# Patient Record
Sex: Female | Born: 1935
Health system: Southern US, Community
[De-identification: ages and names within clinical notes are randomized; demographics above are authoritative.]

## PROBLEM LIST (undated history)

## (undated) DIAGNOSIS — C4491 Basal cell carcinoma of skin, unspecified: Secondary | ICD-10-CM

## (undated) DIAGNOSIS — I480 Paroxysmal atrial fibrillation: Secondary | ICD-10-CM

## (undated) DIAGNOSIS — K2970 Gastritis, unspecified, without bleeding: Secondary | ICD-10-CM

## (undated) DIAGNOSIS — J189 Pneumonia, unspecified organism: Secondary | ICD-10-CM

## (undated) DIAGNOSIS — H269 Unspecified cataract: Secondary | ICD-10-CM

## (undated) DIAGNOSIS — F329 Major depressive disorder, single episode, unspecified: Secondary | ICD-10-CM

## (undated) DIAGNOSIS — F419 Anxiety disorder, unspecified: Secondary | ICD-10-CM

## (undated) DIAGNOSIS — I639 Cerebral infarction, unspecified: Secondary | ICD-10-CM

## (undated) DIAGNOSIS — M8580 Other specified disorders of bone density and structure, unspecified site: Secondary | ICD-10-CM

## (undated) DIAGNOSIS — T7840XA Allergy, unspecified, initial encounter: Secondary | ICD-10-CM

## (undated) DIAGNOSIS — B9681 Helicobacter pylori [H. pylori] as the cause of diseases classified elsewhere: Secondary | ICD-10-CM

## (undated) DIAGNOSIS — F32A Depression, unspecified: Secondary | ICD-10-CM

## (undated) HISTORY — PX: PARTIAL HYSTERECTOMY: SHX80

## (undated) HISTORY — DX: Other specified disorders of bone density and structure, unspecified site: M85.80

## (undated) HISTORY — PX: CHOLECYSTECTOMY: SHX55

## (undated) HISTORY — DX: Cerebral infarction, unspecified: I63.9

## (undated) HISTORY — DX: Helicobacter pylori (H. pylori) as the cause of diseases classified elsewhere: B96.81

## (undated) HISTORY — DX: Unspecified cataract: H26.9

## (undated) HISTORY — DX: Helicobacter pylori (H. pylori) as the cause of diseases classified elsewhere: K29.70

## (undated) HISTORY — DX: Basal cell carcinoma of skin, unspecified: C44.91

## (undated) HISTORY — DX: Pneumonia, unspecified organism: J18.9

## (undated) HISTORY — DX: Anxiety disorder, unspecified: F41.9

## (undated) HISTORY — PX: CATARACT EXTRACTION W/ INTRAOCULAR LENS  IMPLANT, BILATERAL: SHX1307

## (undated) HISTORY — DX: Depression, unspecified: F32.A

## (undated) HISTORY — PX: BREAST LUMPECTOMY: SHX2

## (undated) HISTORY — PX: APPENDECTOMY: SHX54

## (undated) HISTORY — PX: MOHS SURGERY: SUR867

## (undated) HISTORY — PX: SKIN GRAFT: SHX250

## (undated) HISTORY — DX: Allergy, unspecified, initial encounter: T78.40XA

## (undated) HISTORY — PX: OTHER SURGICAL HISTORY: SHX169

---
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
## 1998-02-24 ENCOUNTER — Other Ambulatory Visit: Admission: RE | Admit: 1998-02-24 | Discharge: 1998-02-24 | Payer: Self-pay | Admitting: Obstetrics and Gynecology

## 1998-03-29 ENCOUNTER — Ambulatory Visit (HOSPITAL_COMMUNITY): Admission: RE | Admit: 1998-03-29 | Discharge: 1998-03-29 | Payer: Self-pay | Admitting: *Deleted

## 1999-02-23 ENCOUNTER — Encounter: Payer: Self-pay | Admitting: Obstetrics and Gynecology

## 1999-02-23 ENCOUNTER — Encounter: Admission: RE | Admit: 1999-02-23 | Discharge: 1999-02-23 | Payer: Self-pay | Admitting: Obstetrics and Gynecology

## 1999-03-08 ENCOUNTER — Encounter: Admission: RE | Admit: 1999-03-08 | Discharge: 1999-03-08 | Payer: Self-pay | Admitting: Obstetrics and Gynecology

## 1999-03-08 ENCOUNTER — Encounter: Payer: Self-pay | Admitting: Obstetrics and Gynecology

## 1999-04-03 ENCOUNTER — Emergency Department (HOSPITAL_COMMUNITY): Admission: EM | Admit: 1999-04-03 | Discharge: 1999-04-03 | Payer: Self-pay | Admitting: Emergency Medicine

## 1999-04-03 ENCOUNTER — Encounter: Payer: Self-pay | Admitting: Emergency Medicine

## 1999-09-25 ENCOUNTER — Encounter: Admission: RE | Admit: 1999-09-25 | Discharge: 1999-09-25 | Payer: Self-pay | Admitting: *Deleted

## 1999-09-25 ENCOUNTER — Encounter: Payer: Self-pay | Admitting: *Deleted

## 1999-11-26 ENCOUNTER — Encounter: Payer: Self-pay | Admitting: Emergency Medicine

## 1999-11-26 ENCOUNTER — Emergency Department (HOSPITAL_COMMUNITY): Admission: EM | Admit: 1999-11-26 | Discharge: 1999-11-26 | Payer: Self-pay | Admitting: Emergency Medicine

## 2000-03-08 ENCOUNTER — Encounter: Payer: Self-pay | Admitting: Obstetrics and Gynecology

## 2000-03-08 ENCOUNTER — Encounter: Admission: RE | Admit: 2000-03-08 | Discharge: 2000-03-08 | Payer: Self-pay | Admitting: Obstetrics and Gynecology

## 2000-04-18 ENCOUNTER — Encounter: Admission: RE | Admit: 2000-04-18 | Discharge: 2000-07-17 | Payer: Self-pay | Admitting: Internal Medicine

## 2000-11-15 ENCOUNTER — Emergency Department (HOSPITAL_COMMUNITY): Admission: EM | Admit: 2000-11-15 | Discharge: 2000-11-15 | Payer: Self-pay | Admitting: Emergency Medicine

## 2000-11-15 ENCOUNTER — Encounter: Payer: Self-pay | Admitting: Emergency Medicine

## 2001-03-12 ENCOUNTER — Encounter: Payer: Self-pay | Admitting: Internal Medicine

## 2001-03-12 ENCOUNTER — Encounter: Admission: RE | Admit: 2001-03-12 | Discharge: 2001-03-12 | Payer: Self-pay | Admitting: Internal Medicine

## 2001-03-17 ENCOUNTER — Other Ambulatory Visit: Admission: RE | Admit: 2001-03-17 | Discharge: 2001-03-17 | Payer: Self-pay | Admitting: Obstetrics and Gynecology

## 2001-04-17 ENCOUNTER — Ambulatory Visit (HOSPITAL_COMMUNITY): Admission: RE | Admit: 2001-04-17 | Discharge: 2001-04-17 | Payer: Self-pay | Admitting: Gastroenterology

## 2002-03-27 ENCOUNTER — Encounter: Admission: RE | Admit: 2002-03-27 | Discharge: 2002-03-27 | Payer: Self-pay | Admitting: Internal Medicine

## 2002-03-27 ENCOUNTER — Encounter: Payer: Self-pay | Admitting: Internal Medicine

## 2002-04-08 ENCOUNTER — Other Ambulatory Visit: Admission: RE | Admit: 2002-04-08 | Discharge: 2002-04-08 | Payer: Self-pay | Admitting: Obstetrics and Gynecology

## 2003-03-29 ENCOUNTER — Encounter: Admission: RE | Admit: 2003-03-29 | Discharge: 2003-03-29 | Payer: Self-pay | Admitting: Obstetrics and Gynecology

## 2003-04-12 ENCOUNTER — Other Ambulatory Visit: Admission: RE | Admit: 2003-04-12 | Discharge: 2003-04-12 | Payer: Self-pay | Admitting: Obstetrics and Gynecology

## 2003-09-18 ENCOUNTER — Emergency Department (HOSPITAL_COMMUNITY): Admission: EM | Admit: 2003-09-18 | Discharge: 2003-09-18 | Payer: Self-pay | Admitting: *Deleted

## 2004-03-29 ENCOUNTER — Encounter: Admission: RE | Admit: 2004-03-29 | Discharge: 2004-03-29 | Payer: Self-pay | Admitting: Internal Medicine

## 2004-04-05 ENCOUNTER — Ambulatory Visit: Payer: Self-pay | Admitting: Internal Medicine

## 2004-04-13 ENCOUNTER — Ambulatory Visit: Payer: Self-pay | Admitting: Internal Medicine

## 2004-04-18 ENCOUNTER — Other Ambulatory Visit: Admission: RE | Admit: 2004-04-18 | Discharge: 2004-04-18 | Payer: Self-pay | Admitting: Obstetrics and Gynecology

## 2004-07-05 ENCOUNTER — Ambulatory Visit: Payer: Self-pay | Admitting: Internal Medicine

## 2004-07-19 ENCOUNTER — Ambulatory Visit: Payer: Self-pay | Admitting: Internal Medicine

## 2004-08-18 ENCOUNTER — Ambulatory Visit: Payer: Self-pay | Admitting: Internal Medicine

## 2004-10-11 ENCOUNTER — Ambulatory Visit: Payer: Self-pay | Admitting: Internal Medicine

## 2004-10-18 ENCOUNTER — Ambulatory Visit: Payer: Self-pay | Admitting: Internal Medicine

## 2005-03-01 ENCOUNTER — Ambulatory Visit: Payer: Self-pay | Admitting: Internal Medicine

## 2005-04-04 ENCOUNTER — Encounter: Admission: RE | Admit: 2005-04-04 | Discharge: 2005-04-04 | Payer: Self-pay | Admitting: Obstetrics and Gynecology

## 2005-04-10 ENCOUNTER — Ambulatory Visit: Payer: Self-pay | Admitting: Internal Medicine

## 2005-04-18 ENCOUNTER — Ambulatory Visit: Payer: Self-pay | Admitting: Internal Medicine

## 2005-04-19 ENCOUNTER — Other Ambulatory Visit: Admission: RE | Admit: 2005-04-19 | Discharge: 2005-04-19 | Payer: Self-pay | Admitting: Obstetrics and Gynecology

## 2005-06-14 ENCOUNTER — Ambulatory Visit: Payer: Self-pay | Admitting: Internal Medicine

## 2005-07-11 ENCOUNTER — Ambulatory Visit: Payer: Self-pay | Admitting: Endocrinology

## 2005-07-12 ENCOUNTER — Ambulatory Visit: Payer: Self-pay | Admitting: Internal Medicine

## 2005-08-24 ENCOUNTER — Ambulatory Visit: Payer: Self-pay | Admitting: Endocrinology

## 2005-12-05 ENCOUNTER — Ambulatory Visit: Payer: Self-pay | Admitting: Internal Medicine

## 2005-12-10 ENCOUNTER — Ambulatory Visit: Payer: Self-pay | Admitting: Cardiovascular Disease

## 2005-12-11 ENCOUNTER — Ambulatory Visit: Payer: Self-pay

## 2006-02-08 ENCOUNTER — Ambulatory Visit: Payer: Self-pay | Admitting: Internal Medicine

## 2006-04-08 ENCOUNTER — Encounter: Admission: RE | Admit: 2006-04-08 | Discharge: 2006-04-08 | Payer: Self-pay | Admitting: Obstetrics and Gynecology

## 2006-04-18 ENCOUNTER — Ambulatory Visit: Payer: Self-pay | Admitting: Internal Medicine

## 2006-05-14 ENCOUNTER — Ambulatory Visit: Payer: Self-pay | Admitting: Internal Medicine

## 2006-05-14 LAB — CONVERTED CEMR LAB
ALT: 18 units/L (ref 0–40)
AST: 20 units/L (ref 0–37)
BUN: 23 mg/dL (ref 6–23)
Chol/HDL Ratio, serum: 4.2
Cholesterol: 232 mg/dL (ref 0–200)
Creatinine, Ser: 0.7 mg/dL (ref 0.4–1.2)
Glucose, Bld: 99 mg/dL (ref 70–99)
HDL: 55.1 mg/dL (ref >39.0)
LDL DIRECT: 151.2 mg/dL
Potassium: 4.3 meq/L (ref 3.5–5.1)
Sed Rate: 19 mm/hr (ref 0–25)
Sodium: 139 meq/L (ref 135–145)
TSH: 4.39 microintl units/mL (ref 0.35–5.50)
Triglyceride fasting, serum: 64 mg/dL (ref 0–149)
VLDL: 13 mg/dL (ref 0–40)
Vit D, 1,25-Dihydroxy: 21 (ref 20–57)

## 2006-06-15 ENCOUNTER — Ambulatory Visit: Payer: Self-pay | Admitting: Family Medicine

## 2006-07-16 ENCOUNTER — Ambulatory Visit: Payer: Self-pay | Admitting: Internal Medicine

## 2006-08-08 ENCOUNTER — Encounter: Admission: RE | Admit: 2006-08-08 | Discharge: 2006-08-08 | Payer: Self-pay | Admitting: Orthopedic Surgery

## 2006-08-14 ENCOUNTER — Ambulatory Visit: Payer: Self-pay | Admitting: Internal Medicine

## 2006-10-17 ENCOUNTER — Ambulatory Visit: Payer: Self-pay | Admitting: Internal Medicine

## 2006-10-17 LAB — CONVERTED CEMR LAB
ALT: 18 units/L (ref 0–40)
AST: 20 units/L (ref 0–37)
Albumin: 4 g/dL (ref 3.5–5.2)
Alkaline Phosphatase: 70 units/L (ref 39–117)
BUN: 23 mg/dL (ref 6–23)
Basophils Absolute: 0 10*3/uL (ref 0.0–0.1)
Basophils Relative: 0.6 % (ref 0.0–1.0)
Bilirubin Urine: NEGATIVE
Bilirubin, Direct: 0.1 mg/dL (ref 0.0–0.3)
CO2: 29 meq/L (ref 19–32)
Calcium: 9.4 mg/dL (ref 8.4–10.5)
Chloride: 107 meq/L (ref 96–112)
Creatinine, Ser: 0.6 mg/dL (ref 0.4–1.2)
Crystals: NEGATIVE
Eosinophils Absolute: 0.1 10*3/uL (ref 0.0–0.6)
Eosinophils Relative: 2.5 % (ref 0.0–5.0)
GFR calc Af Amer: 128 mL/min
GFR calc non Af Amer: 106 mL/min
Glucose, Bld: 87 mg/dL (ref 70–99)
HCT: 39.8 % (ref 36.0–46.0)
Hemoglobin: 13.5 g/dL (ref 12.0–15.0)
Ketones, ur: NEGATIVE mg/dL
Lymphocytes Relative: 29.4 % (ref 12.0–46.0)
MCHC: 33.9 g/dL (ref 30.0–36.0)
MCV: 92.8 fL (ref 78.0–100.0)
Monocytes Absolute: 0.5 10*3/uL (ref 0.2–0.7)
Monocytes Relative: 8.5 % (ref 3.0–11.0)
Neutro Abs: 3.4 10*3/uL (ref 1.4–7.7)
Neutrophils Relative %: 59 % (ref 43.0–77.0)
Nitrite: NEGATIVE
Platelets: 206 10*3/uL (ref 150–400)
Potassium: 4.4 meq/L (ref 3.5–5.1)
RBC: 4.29 M/uL (ref 3.87–5.11)
RDW: 12.3 % (ref 11.5–14.6)
Sodium: 141 meq/L (ref 135–145)
Specific Gravity, Urine: >=1.03 (ref 1.000–1.03)
TSH: 3.04 microintl units/mL (ref 0.35–5.50)
Total Bilirubin: 0.8 mg/dL (ref 0.3–1.2)
Total Protein, Urine: NEGATIVE mg/dL
Total Protein: 6.7 g/dL (ref 6.0–8.3)
Urine Glucose: NEGATIVE mg/dL
Urobilinogen, UA: 0.2 (ref 0.0–1.0)
WBC: 5.6 10*3/uL (ref 4.5–10.5)
pH: 5.5 (ref 5.0–8.0)

## 2006-11-21 DIAGNOSIS — T7840XA Allergy, unspecified, initial encounter: Secondary | ICD-10-CM | POA: Insufficient documentation

## 2006-11-21 DIAGNOSIS — E785 Hyperlipidemia, unspecified: Secondary | ICD-10-CM

## 2006-11-21 DIAGNOSIS — M899 Disorder of bone, unspecified: Secondary | ICD-10-CM | POA: Insufficient documentation

## 2006-11-21 DIAGNOSIS — M949 Disorder of cartilage, unspecified: Secondary | ICD-10-CM

## 2007-02-07 ENCOUNTER — Ambulatory Visit: Payer: Self-pay | Admitting: Internal Medicine

## 2007-02-07 ENCOUNTER — Encounter: Payer: Self-pay | Admitting: Internal Medicine

## 2007-02-07 DIAGNOSIS — F411 Generalized anxiety disorder: Secondary | ICD-10-CM

## 2007-02-07 DIAGNOSIS — R634 Abnormal weight loss: Secondary | ICD-10-CM

## 2007-02-07 DIAGNOSIS — F519 Sleep disorder not due to a substance or known physiological condition, unspecified: Secondary | ICD-10-CM | POA: Insufficient documentation

## 2007-04-10 ENCOUNTER — Encounter: Admission: RE | Admit: 2007-04-10 | Discharge: 2007-04-10 | Payer: Self-pay | Admitting: Obstetrics and Gynecology

## 2007-05-08 ENCOUNTER — Ambulatory Visit: Payer: Self-pay | Admitting: Internal Medicine

## 2007-08-18 ENCOUNTER — Ambulatory Visit: Payer: Self-pay | Admitting: Internal Medicine

## 2007-10-14 ENCOUNTER — Encounter: Admission: RE | Admit: 2007-10-14 | Discharge: 2007-10-14 | Payer: Self-pay | Admitting: Orthopedic Surgery

## 2007-11-06 ENCOUNTER — Encounter: Payer: Self-pay | Admitting: Internal Medicine

## 2007-11-25 ENCOUNTER — Ambulatory Visit: Payer: Self-pay | Admitting: Internal Medicine

## 2007-11-25 LAB — CONVERTED CEMR LAB
ALT: 13 units/L (ref 0–35)
AST: 18 units/L (ref 0–37)
Albumin: 3.8 g/dL (ref 3.5–5.2)
Alkaline Phosphatase: 69 units/L (ref 39–117)
BUN: 21 mg/dL (ref 6–23)
Basophils Absolute: 0.2 10*3/uL — ABNORMAL HIGH (ref 0.0–0.1)
Basophils Relative: 2.6 % (ref 0.0–3.0)
Bilirubin Urine: NEGATIVE
Bilirubin, Direct: 0.1 mg/dL (ref 0.0–0.3)
CO2: 29 meq/L (ref 19–32)
Calcium: 9.3 mg/dL (ref 8.4–10.5)
Chloride: 105 meq/L (ref 96–112)
Creatinine, Ser: 0.7 mg/dL (ref 0.4–1.2)
Crystals: NEGATIVE
Eosinophils Absolute: 0.1 10*3/uL (ref 0.0–0.7)
Eosinophils Relative: 1.4 % (ref 0.0–5.0)
GFR calc Af Amer: 107 mL/min
GFR calc non Af Amer: 88 mL/min
Glucose, Bld: 100 mg/dL — ABNORMAL HIGH (ref 70–99)
HCT: 38.1 % (ref 36.0–46.0)
Hemoglobin, Urine: NEGATIVE
Hemoglobin: 13 g/dL (ref 12.0–15.0)
Ketones, ur: NEGATIVE mg/dL
Lymphocytes Relative: 25.1 % (ref 12.0–46.0)
MCHC: 34.1 g/dL (ref 30.0–36.0)
MCV: 95.3 fL (ref 78.0–100.0)
Monocytes Absolute: 0.3 10*3/uL (ref 0.1–1.0)
Monocytes Relative: 5.9 % (ref 3.0–12.0)
Mucus, UA: NEGATIVE
Neutro Abs: 3.8 10*3/uL (ref 1.4–7.7)
Neutrophils Relative %: 65 % (ref 43.0–77.0)
Nitrite: NEGATIVE
Platelets: 201 10*3/uL (ref 150–400)
Potassium: 4.5 meq/L (ref 3.5–5.1)
RBC / HPF: NONE SEEN
RBC: 4 M/uL (ref 3.87–5.11)
RDW: 11.8 % (ref 11.5–14.6)
Sodium: 139 meq/L (ref 135–145)
Specific Gravity, Urine: 1.025 (ref 1.000–1.03)
TSH: 2.9 microintl units/mL (ref 0.35–5.50)
Total Bilirubin: 1 mg/dL (ref 0.3–1.2)
Total Protein, Urine: NEGATIVE mg/dL
Total Protein: 6.4 g/dL (ref 6.0–8.3)
Urine Glucose: NEGATIVE mg/dL
Urobilinogen, UA: 0.2 (ref 0.0–1.0)
Vit D, 1,25-Dihydroxy: 30 (ref 30–89)
WBC: 5.9 10*3/uL (ref 4.5–10.5)
pH: 5.5 (ref 5.0–8.0)

## 2007-12-05 ENCOUNTER — Ambulatory Visit: Payer: Self-pay | Admitting: Internal Medicine

## 2008-01-29 ENCOUNTER — Ambulatory Visit: Payer: Self-pay | Admitting: Internal Medicine

## 2008-01-29 DIAGNOSIS — B9789 Other viral agents as the cause of diseases classified elsewhere: Secondary | ICD-10-CM

## 2008-02-02 ENCOUNTER — Telehealth: Payer: Self-pay | Admitting: Internal Medicine

## 2008-02-17 ENCOUNTER — Ambulatory Visit: Payer: Self-pay | Admitting: Internal Medicine

## 2008-02-17 DIAGNOSIS — R05 Cough: Secondary | ICD-10-CM

## 2008-02-17 DIAGNOSIS — R059 Cough, unspecified: Secondary | ICD-10-CM | POA: Insufficient documentation

## 2008-02-25 ENCOUNTER — Ambulatory Visit: Payer: Self-pay | Admitting: Internal Medicine

## 2008-02-25 DIAGNOSIS — F4323 Adjustment disorder with mixed anxiety and depressed mood: Secondary | ICD-10-CM

## 2008-04-13 ENCOUNTER — Encounter: Admission: RE | Admit: 2008-04-13 | Discharge: 2008-04-13 | Payer: Self-pay | Admitting: Obstetrics and Gynecology

## 2008-04-19 ENCOUNTER — Ambulatory Visit: Payer: Self-pay | Admitting: Internal Medicine

## 2008-04-19 ENCOUNTER — Encounter: Payer: Self-pay | Admitting: Internal Medicine

## 2008-04-19 DIAGNOSIS — J209 Acute bronchitis, unspecified: Secondary | ICD-10-CM

## 2008-05-06 ENCOUNTER — Ambulatory Visit: Payer: Self-pay | Admitting: Internal Medicine

## 2008-05-06 DIAGNOSIS — Z8709 Personal history of other diseases of the respiratory system: Secondary | ICD-10-CM | POA: Insufficient documentation

## 2008-05-31 ENCOUNTER — Ambulatory Visit: Payer: Self-pay | Admitting: Internal Medicine

## 2008-05-31 LAB — CONVERTED CEMR LAB
BUN: 17 mg/dL (ref 6–23)
Basophils Absolute: 0 10*3/uL (ref 0.0–0.1)
Basophils Relative: 0.5 % (ref 0.0–3.0)
CO2: 30 meq/L (ref 19–32)
Calcium: 9.5 mg/dL (ref 8.4–10.5)
Chloride: 105 meq/L (ref 96–112)
Cholesterol: 208 mg/dL (ref 0–200)
Creatinine, Ser: 0.7 mg/dL (ref 0.4–1.2)
Direct LDL: 136.8 mg/dL
Eosinophils Absolute: 0.1 10*3/uL (ref 0.0–0.7)
Eosinophils Relative: 2 % (ref 0.0–5.0)
GFR calc Af Amer: 106 mL/min
GFR calc non Af Amer: 88 mL/min
Glucose, Bld: 99 mg/dL (ref 70–99)
HCT: 39.1 % (ref 36.0–46.0)
HDL: 55.2 mg/dL (ref >39.0)
Hemoglobin: 13.3 g/dL (ref 12.0–15.0)
Lymphocytes Relative: 24.8 % (ref 12.0–46.0)
MCHC: 33.9 g/dL (ref 30.0–36.0)
MCV: 95.2 fL (ref 78.0–100.0)
Monocytes Absolute: 0.4 10*3/uL (ref 0.1–1.0)
Monocytes Relative: 6.8 % (ref 3.0–12.0)
Neutro Abs: 3.8 10*3/uL (ref 1.4–7.7)
Neutrophils Relative %: 65.9 % (ref 43.0–77.0)
Platelets: 159 10*3/uL (ref 150–400)
Potassium: 4.5 meq/L (ref 3.5–5.1)
RBC: 4.11 M/uL (ref 3.87–5.11)
RDW: 12.8 % (ref 11.5–14.6)
Sodium: 140 meq/L (ref 135–145)
TSH: 3.22 microintl units/mL (ref 0.35–5.50)
Total CHOL/HDL Ratio: 3.8
Triglycerides: 68 mg/dL (ref 0–149)
VLDL: 14 mg/dL (ref 0–40)
WBC: 5.7 10*3/uL (ref 4.5–10.5)

## 2008-06-08 ENCOUNTER — Ambulatory Visit: Payer: Self-pay | Admitting: Internal Medicine

## 2008-10-18 ENCOUNTER — Ambulatory Visit: Payer: Self-pay | Admitting: Internal Medicine

## 2009-02-08 ENCOUNTER — Ambulatory Visit: Payer: Self-pay | Admitting: Internal Medicine

## 2009-04-01 ENCOUNTER — Ambulatory Visit: Payer: Self-pay | Admitting: Internal Medicine

## 2009-04-01 LAB — CONVERTED CEMR LAB
ALT: 17 units/L (ref 0–35)
AST: 21 units/L (ref 0–37)
Albumin: 3.9 g/dL (ref 3.5–5.2)
Alkaline Phosphatase: 80 units/L (ref 39–117)
BUN: 16 mg/dL (ref 6–23)
Basophils Absolute: 0 10*3/uL (ref 0.0–0.1)
Basophils Relative: 0.3 % (ref 0.0–3.0)
Bilirubin Urine: NEGATIVE
Bilirubin, Direct: 0.1 mg/dL (ref 0.0–0.3)
CO2: 29 meq/L (ref 19–32)
Calcium: 9.5 mg/dL (ref 8.4–10.5)
Chloride: 106 meq/L (ref 96–112)
Creatinine, Ser: 0.7 mg/dL (ref 0.4–1.2)
Eosinophils Absolute: 0.1 10*3/uL (ref 0.0–0.7)
Eosinophils Relative: 1.2 % (ref 0.0–5.0)
GFR calc non Af Amer: 87.66 mL/min (ref >60)
Glucose, Bld: 90 mg/dL (ref 70–99)
HCT: 40.2 % (ref 36.0–46.0)
Hemoglobin: 13.5 g/dL (ref 12.0–15.0)
Ketones, ur: NEGATIVE mg/dL
Lymphocytes Relative: 21.2 % (ref 12.0–46.0)
Lymphs Abs: 1.2 10*3/uL (ref 0.7–4.0)
MCHC: 33.5 g/dL (ref 30.0–36.0)
MCV: 97.3 fL (ref 78.0–100.0)
Monocytes Absolute: 0.3 10*3/uL (ref 0.1–1.0)
Monocytes Relative: 5.8 % (ref 3.0–12.0)
Neutro Abs: 4.2 10*3/uL (ref 1.4–7.7)
Neutrophils Relative %: 71.5 % (ref 43.0–77.0)
Nitrite: NEGATIVE
Platelets: 201 10*3/uL (ref 150.0–400.0)
Potassium: 4.6 meq/L (ref 3.5–5.1)
RBC: 4.14 M/uL (ref 3.87–5.11)
RDW: 12.2 % (ref 11.5–14.6)
Sodium: 142 meq/L (ref 135–145)
Specific Gravity, Urine: <=1.005 (ref 1.000–1.030)
TSH: 2.54 microintl units/mL (ref 0.35–5.50)
Total Bilirubin: 0.9 mg/dL (ref 0.3–1.2)
Total Protein, Urine: NEGATIVE mg/dL
Total Protein: 7 g/dL (ref 6.0–8.3)
Urine Glucose: NEGATIVE mg/dL
Urobilinogen, UA: 0.2 (ref 0.0–1.0)
WBC: 5.8 10*3/uL (ref 4.5–10.5)
pH: 5.5 (ref 5.0–8.0)

## 2009-04-11 ENCOUNTER — Ambulatory Visit: Payer: Self-pay | Admitting: Internal Medicine

## 2009-04-18 ENCOUNTER — Encounter: Admission: RE | Admit: 2009-04-18 | Discharge: 2009-04-18 | Payer: Self-pay | Admitting: Obstetrics and Gynecology

## 2009-06-29 ENCOUNTER — Telehealth: Payer: Self-pay | Admitting: Internal Medicine

## 2009-07-14 ENCOUNTER — Ambulatory Visit: Payer: Self-pay | Admitting: Internal Medicine

## 2009-09-12 ENCOUNTER — Ambulatory Visit: Payer: Self-pay | Admitting: Internal Medicine

## 2009-12-12 ENCOUNTER — Telehealth: Payer: Self-pay | Admitting: Internal Medicine

## 2009-12-12 ENCOUNTER — Ambulatory Visit: Payer: Self-pay | Admitting: Internal Medicine

## 2010-02-06 ENCOUNTER — Ambulatory Visit: Payer: Self-pay | Admitting: Internal Medicine

## 2010-02-07 ENCOUNTER — Encounter: Payer: Self-pay | Admitting: Internal Medicine

## 2010-02-08 LAB — CONVERTED CEMR LAB
ALT: 16 units/L (ref 0–35)
AST: 18 units/L (ref 0–37)
Albumin: 3.5 g/dL (ref 3.5–5.2)
Alkaline Phosphatase: 80 units/L (ref 39–117)
BUN: 15 mg/dL (ref 6–23)
Basophils Absolute: 0 10*3/uL (ref 0.0–0.1)
Basophils Relative: 0.6 % (ref 0.0–3.0)
Bilirubin Urine: NEGATIVE
Bilirubin, Direct: 0.1 mg/dL (ref 0.0–0.3)
CO2: 29 meq/L (ref 19–32)
Calcium: 9.4 mg/dL (ref 8.4–10.5)
Chloride: 107 meq/L (ref 96–112)
Cholesterol: 205 mg/dL — ABNORMAL HIGH (ref 0–200)
Creatinine, Ser: 0.7 mg/dL (ref 0.4–1.2)
Direct LDL: 129.5 mg/dL
Eosinophils Absolute: 0.1 10*3/uL (ref 0.0–0.7)
Eosinophils Relative: 2.1 % (ref 0.0–5.0)
GFR calc non Af Amer: 84.65 mL/min (ref >60)
Glucose, Bld: 85 mg/dL (ref 70–99)
HCT: 36.7 % (ref 36.0–46.0)
HDL: 49.5 mg/dL (ref >39.00)
Hemoglobin: 12.8 g/dL (ref 12.0–15.0)
Ketones, ur: NEGATIVE mg/dL
Lymphocytes Relative: 25.1 % (ref 12.0–46.0)
Lymphs Abs: 1.6 10*3/uL (ref 0.7–4.0)
MCHC: 34.9 g/dL (ref 30.0–36.0)
MCV: 94.2 fL (ref 78.0–100.0)
Monocytes Absolute: 0.4 10*3/uL (ref 0.1–1.0)
Monocytes Relative: 6.5 % (ref 3.0–12.0)
Neutro Abs: 4.1 10*3/uL (ref 1.4–7.7)
Neutrophils Relative %: 65.7 % (ref 43.0–77.0)
Nitrite: NEGATIVE
Platelets: 280 10*3/uL (ref 150.0–400.0)
Potassium: 5 meq/L (ref 3.5–5.1)
RBC: 3.9 M/uL (ref 3.87–5.11)
RDW: 12.5 % (ref 11.5–14.6)
Sodium: 141 meq/L (ref 135–145)
Specific Gravity, Urine: 1.02 (ref 1.000–1.030)
TSH: 3.77 microintl units/mL (ref 0.35–5.50)
Total Bilirubin: 0.6 mg/dL (ref 0.3–1.2)
Total CHOL/HDL Ratio: 4
Total Protein, Urine: NEGATIVE mg/dL
Total Protein: 6.3 g/dL (ref 6.0–8.3)
Triglycerides: 80 mg/dL (ref 0.0–149.0)
Urine Glucose: NEGATIVE mg/dL
Urobilinogen, UA: 0.2 (ref 0.0–1.0)
VLDL: 16 mg/dL (ref 0.0–40.0)
WBC: 6.3 10*3/uL (ref 4.5–10.5)
pH: 6 (ref 5.0–8.0)

## 2010-03-01 ENCOUNTER — Encounter: Payer: Self-pay | Admitting: Internal Medicine

## 2010-03-01 ENCOUNTER — Ambulatory Visit: Payer: Self-pay | Admitting: Internal Medicine

## 2010-03-01 DIAGNOSIS — H60399 Other infective otitis externa, unspecified ear: Secondary | ICD-10-CM | POA: Insufficient documentation

## 2010-03-10 ENCOUNTER — Encounter: Payer: Self-pay | Admitting: Internal Medicine

## 2010-03-10 ENCOUNTER — Ambulatory Visit: Payer: Self-pay | Admitting: Internal Medicine

## 2010-03-22 ENCOUNTER — Ambulatory Visit: Payer: Self-pay | Admitting: Internal Medicine

## 2010-03-22 DIAGNOSIS — R1084 Generalized abdominal pain: Secondary | ICD-10-CM

## 2010-03-22 DIAGNOSIS — R350 Frequency of micturition: Secondary | ICD-10-CM

## 2010-03-22 DIAGNOSIS — M545 Low back pain: Secondary | ICD-10-CM

## 2010-03-22 DIAGNOSIS — K5900 Constipation, unspecified: Secondary | ICD-10-CM | POA: Insufficient documentation

## 2010-03-22 LAB — CONVERTED CEMR LAB
Amylase: 28 units/L (ref 0–105)
BUN: 25 mg/dL — ABNORMAL HIGH (ref 6–23)
Basophils Absolute: 0 10*3/uL (ref 0.0–0.1)
Basophils Relative: 0.4 % (ref 0.0–3.0)
Bilirubin Urine: NEGATIVE
CO2: 26 meq/L (ref 19–32)
Calcium: 9.2 mg/dL (ref 8.4–10.5)
Chloride: 106 meq/L (ref 96–112)
Creatinine, Ser: 0.77 mg/dL (ref 0.40–1.20)
Eosinophils Absolute: 0 10*3/uL (ref 0.0–0.7)
Eosinophils Relative: 0.5 % (ref 0.0–5.0)
Glucose, Bld: 93 mg/dL (ref 70–99)
HCT: 35.3 % — ABNORMAL LOW (ref 36.0–46.0)
Hemoglobin, Urine: NEGATIVE
Hemoglobin: 12.4 g/dL (ref 12.0–15.0)
Ketones, ur: NEGATIVE mg/dL
Lipase: 22 units/L (ref 0–75)
Lymphocytes Relative: 16.7 % (ref 12.0–46.0)
Lymphs Abs: 1.4 10*3/uL (ref 0.7–4.0)
MCHC: 35.1 g/dL (ref 30.0–36.0)
MCV: 93.1 fL (ref 78.0–100.0)
Monocytes Absolute: 0.7 10*3/uL (ref 0.1–1.0)
Monocytes Relative: 8.4 % (ref 3.0–12.0)
Neutro Abs: 6.3 10*3/uL (ref 1.4–7.7)
Neutrophils Relative %: 74 % (ref 43.0–77.0)
Nitrite: NEGATIVE
Platelets: 182 10*3/uL (ref 150.0–400.0)
Potassium: 4.1 meq/L (ref 3.5–5.3)
RBC: 3.79 M/uL — ABNORMAL LOW (ref 3.87–5.11)
RDW: 12.8 % (ref 11.5–14.6)
Sodium: 139 meq/L (ref 135–145)
Specific Gravity, Urine: <=1.005 (ref 1.000–1.030)
TSH: 2.123 microintl units/mL (ref 0.350–4.500)
Total Protein, Urine: NEGATIVE mg/dL
Urine Glucose: NEGATIVE mg/dL
Urobilinogen, UA: 0.2 (ref 0.0–1.0)
WBC: 8.6 10*3/uL (ref 4.5–10.5)
pH: 6.5 (ref 5.0–8.0)

## 2010-04-17 ENCOUNTER — Telehealth: Payer: Self-pay | Admitting: Internal Medicine

## 2010-04-25 ENCOUNTER — Ambulatory Visit
Admission: RE | Admit: 2010-04-25 | Discharge: 2010-04-25 | Payer: Self-pay | Source: Home / Self Care | Attending: Internal Medicine | Admitting: Internal Medicine

## 2010-04-30 HISTORY — PX: ROTATOR CUFF REPAIR: SHX139

## 2010-05-21 ENCOUNTER — Encounter: Payer: Self-pay | Admitting: Obstetrics and Gynecology

## 2010-05-21 ENCOUNTER — Encounter: Payer: Self-pay | Admitting: Internal Medicine

## 2010-05-22 ENCOUNTER — Encounter
Admission: RE | Admit: 2010-05-22 | Discharge: 2010-05-22 | Payer: Self-pay | Source: Home / Self Care | Attending: Internal Medicine | Admitting: Internal Medicine

## 2010-05-22 LAB — HM MAMMOGRAPHY: HM Mammogram: NEGATIVE

## 2010-06-01 NOTE — Miscellaneous (Signed)
Summary: Bone Density  Clinical Lists Changes  Orders: Added new Test order of T-Lumbar Vertebral Assessment (77082) - Signed 

## 2010-06-01 NOTE — Assessment & Plan Note (Signed)
Summary: 3 MO ROV /NWS  #   Vital Signs:  Patient profile:   75 year old female Weight:      108 pounds Temp:     98.6 degrees F oral Pulse rate:   67 / minute BP sitting:   112 / 64  (left arm)  Vitals Entered By: Tora Perches (July 14, 2009 8:51 AM) CC: f/u Is Patient Diabetic? No   CC:  f/u.  History of Present Illness: C/o stress and anxiety  Preventive Screening-Counseling & Management  Alcohol-Tobacco     Smoking Status: never  Allergies: 1)  ! * Lovastatin  Past History:  Past Medical History: Last updated: 05/06/2008 Osteopenia Anxiety Pneumonia, hx of 12/09  Social History: Last updated: 04/11/2009 Retired Looking after her GGD Married Never Smoked Regular exercise-yes, yoga  Review of Systems       The patient complains of depression.  The patient denies fever, syncope, dyspnea on exertion, abdominal pain, and difficulty walking.    Physical Exam  General:  Well-developed,well-nourished,in no acute distress; alert,appropriate and cooperative throughout examination Nose:  External nasal examination shows no deformity or inflammation. Nasal mucosa are pink and moist without lesions or exudates. Mouth:  Oral mucosa and oropharynx without lesions or exudates.  Teeth in good repair. Lungs:  Normal respiratory effort, chest expands symmetrically. Lungs are clear to auscultation, no crackles or wheezes. Heart:  Normal rate and regular rhythm. S1 and S2 normal without gallop, murmur, click, rub or other extra sounds. Abdomen:  Bowel sounds positive,abdomen soft and non-tender without masses, organomegaly or hernias noted. Msk:  No deformity or scoliosis noted of thoracic or lumbar spine.   Neurologic:  No cranial nerve deficits noted. Station and gait are normal. Plantar reflexes are down-going bilaterally. DTRs are symmetrical throughout. Sensory, motor and coordinative functions appear intact. Skin:  Intact without suspicious lesions or rashes Psych:   Oriented X3, normally interactive, and not depressed appearing.  not suicidal.     Impression & Recommendations:  Problem # 1:  ANXIETY STATE, UNSPECIFIED (ICD-300.00) Assessment Comment Only  The following medications were removed from the medication list:    Fluoxetine Hcl 10 Mg Caps (Fluoxetine hcl) .Marland Kitchen... Take 1 capsule by mouth daily Her updated medication list for this problem includes:    Lorazepam 0.5 Mg Tabs (Lorazepam) .Marland Kitchen... 1 by mouth two times a day prn    Fluoxetine Hcl 10 Mg Tabs (Fluoxetine hcl) .Marland Kitchen... 1 by mouth once daily for stress The office visit took longer than 20 min with patient councelling for more than 50% of the 20 min    Problem # 2:  DEPRESSION (ICD-311) Assessment: Unchanged The pt thinks she is not depression The following medications were removed from the medication list:    Fluoxetine Hcl 10 Mg Caps (Fluoxetine hcl) .Marland Kitchen... Take 1 capsule by mouth daily Her updated medication list for this problem includes:    Lorazepam 0.5 Mg Tabs (Lorazepam) .Marland Kitchen... 1 by mouth two times a day prn    Fluoxetine Hcl 10 Mg Tabs (Fluoxetine hcl) .Marland Kitchen... 1 by mouth once daily for stress  Problem # 3:  INSOMNIA-SLEEP DISORDER-UNSPEC (ICD-307.40) Assessment: Improved  Complete Medication List: 1)  Vitamin D3 1000 Unit Tabs (Cholecalciferol) .... 2 once daily po 2)  Fish Oil Oil (Fish oil) .Marland Kitchen.. 1 by mouth bid 3)  Lorazepam 0.5 Mg Tabs (Lorazepam) .Marland Kitchen.. 1 by mouth two times a day prn 4)  Fluoxetine Hcl 10 Mg Tabs (Fluoxetine hcl) .Marland Kitchen.. 1 by mouth once daily  for stress  Patient Instructions: 1)  Please schedule a follow-up appointment in 2 months. Prescriptions: FLUOXETINE HCL 10 MG TABS (FLUOXETINE HCL) 1 by mouth once daily for stress  #30 x 6   Entered and Authorized by:   Tresa Garter MD   Signed by:   Tresa Garter MD on 07/14/2009   Method used:   Print then Give to Patient   RxID:   [id number]

## 2010-06-01 NOTE — Progress Notes (Signed)
Summary: return call/form  Phone Note Call from Patient Call back at Home Phone [phone]   Caller: Patient Summary of Call: Patient called stating that she was seen this am and had some forms completed. She states that one of the questions is answered No instead of Yes. She is to come back on wednesday for TB and would like corrected. Pt ask for a return call.Marland KitchenMarland KitchenAlvy Beal Archie CMA  December 12, 2009 4:29 PM   Follow-up for Phone Call        Patient will drop of form tomorrow to my attention for evaluation of correction. MD checked YES when should have checked NO to question if there was anything limiting her from working. The next question he wrote she was in "good health".  Follow-up by: Lamar Sprinkles, CMA,  December 12, 2009 5:37 PM

## 2010-06-01 NOTE — Progress Notes (Signed)
Summary: REFERRAL?   Phone Note Call from Patient   Summary of Call: Pt continues to have increase in pain and req advisement. She wants to know if she needs referral? Pain in now going into her right leg.  Initial call taken by: Lamar Sprinkles, CMA,  April 17, 2010 9:57 AM  Follow-up for Phone Call        MD put referral in EMR, pt aware. Pt is very uncomfortable, any further advisement for pain relief while waiting on ortho apt in January?  Follow-up by: Lamar Sprinkles, CMA,  April 17, 2010 11:11 AM  Additional Follow-up for Phone Call Additional follow up Details #1::        Called [facility] and spoke with scheduler who will send messg to "hailey" Dr Shon Baton scheduler to try an work patient in sooner that her january 7 appt.Marland KitchenMarland KitchenAlvy Beal Archie CMA  April 17, 2010 11:49 AM   Pt informed of above, will hold phone note to f/u on ortho referral...............Marland KitchenLamar Sprinkles, CMA  April 17, 2010 12:27 PM   Pt spoke w/Manager Vicie Mutters regarding complaints about her office visit. She f/u w/pt today and pt states she has no pain. Additional Follow-up by: Lamar Sprinkles, CMA,  April 21, 2010 10:06 AM    New/Updated Medications: TRAMADOL HCL 50 MG TABS (TRAMADOL HCL) 1-2 by mouth qid as needed for pain Prescriptions: TRAMADOL HCL 50 MG TABS (TRAMADOL HCL) 1-2 by mouth qid as needed for pain  #75 x 1   Entered and Authorized by:   Etta Grandchild MD   Signed by:   Etta Grandchild MD on 04/17/2010   Method used:   Electronically to        [facility] [address] [phone]* (retail)       [address]       [place], [place]  [address]       Ph: [phone] or [phone]       Fax: [phone]   RxID:   [id number]

## 2010-06-01 NOTE — Assessment & Plan Note (Signed)
Summary: DR AVP PT/NO SLOT--BACK AND HIP PAIN  STC   Vital Signs:  Patient profile:   75 year old female Menstrual status:  hysterectomy Height:      62 inches Weight:      112 pounds BMI:     20.56 O2 Sat:      96 % on Room air Temp:     98.8 degrees F oral Pulse rate:   76 / minute Pulse rhythm:   regular Resp:     16 per minute BP sitting:   104 / 66  (left arm) Cuff size:   regular  Vitals Entered By: Rock Nephew CMA (March 22, 2010 1:39 PM)  O2 Flow:  Room air CC: Patient c/o R side back pain, Abdominal pain, Back pain Is Patient Diabetic? No Pain Assessment Patient in pain? no       Does patient need assistance? Functional Status Self care Ambulation Normal     Menstrual Status hysterectomy   Primary Care Provider:  Tresa Garter MD  CC:  Patient c/o R side back pain, Abdominal pain, and Back pain.  History of Present Illness:  Abdominal Pain      This is a 75 year old woman who presents with Abdominal pain.  The symptoms began 2 weeks ago.  On a scale of 1 to 10, the intensity is described as a 3.  The patient reports constipation, but denies nausea, vomiting, diarrhea, melena, hematochezia, anorexia, and hematemesis.  The location of the pain is diffuse.  The pain is described as constant and dull.  The patient denies the following symptoms: fever, weight loss, dysuria, chest pain, jaundice, dark urine, and vaginal bleeding.  The pain is worse with food.  The pain is better with defecation.    Back Pain      The patient also presents with Back pain.  The symptoms began 1 day ago.  The intensity is described as moderate.  The patient denies fever, chills, weakness, loss of sensation, fecal incontinence, urinary incontinence, urinary retention, dysuria, rest pain, inability to work, and inability to care for self.  The pain is located in the right low back.  The pain began suddenly and after a blow- a child jumped on her back yesterday and knocked her  down.  The pain radiates to the right buttock.  The pain is made worse by flexion and extension.  The pain is made better by inactivity and NSAID medications.  Risk factors for serious underlying conditions include age >= 50 years and significant trauma.    Preventive Screening-Counseling & Management  Alcohol-Tobacco     Alcohol drinks/day: 0     Alcohol Counseling: not indicated; patient does not drink     Smoking Status: never     Passive Smoke Exposure: no     Tobacco Counseling: not indicated; no tobacco use  Hep-HIV-STD-Contraception     Hepatitis Risk: no risk noted     HIV Risk: no     STD Risk: no risk noted     Dental Visit-last 6 months no     Sun Exposure-Excessive: no      Sexual History:  currently monogamous.        Drug Use:  no.    Clinical Review Panels:  Prevention   Last Mammogram:  ASSESSMENT: Negative - BI-RADS 1^MM DIGITAL SCREENING (04/18/2009)  Immunizations   Last Tetanus Booster:  Td (06/14/2005)   Last Flu Vaccine:  Fluvax 3+ (02/08/2009)   Last Pneumovax:  Historical (02/02/2005)   Last Zoster Vaccine:  Zostavax (08/18/2007)   Last PPD Date Read:  12/14/2009 (12/12/2009)   Last PPD Result:  negative (12/12/2009)  Lipid Management   Cholesterol:  205 (02/06/2010)   LDL (bad choesterol):  DEL (05/31/2008)   HDL (good cholesterol):  49.50 (02/06/2010)   Triglycerides:  64 (05/14/2006)  Diabetes Management   Creatinine:  0.7 (02/06/2010)   Last Flu Vaccine:  Fluvax 3+ (02/08/2009)   Last Pneumovax:  Historical (02/02/2005)  CBC   WBC:  6.3 (02/06/2010)   RBC:  3.90 (02/06/2010)   Hgb:  12.8 (02/06/2010)   Hct:  36.7 (02/06/2010)   Platelets:  280.0 (02/06/2010)   MCV  94.2 (02/06/2010)   MCHC  34.9 (02/06/2010)   RDW  12.5 (02/06/2010)   PMN:  65.7 (02/06/2010)   Lymphs:  25.1 (02/06/2010)   Monos:  6.5 (02/06/2010)   Eosinophils:  2.1 (02/06/2010)   Basophil:  0.6 (02/06/2010)  Complete Metabolic Panel   Glucose:  85  (02/06/2010)   Sodium:  141 (02/06/2010)   Potassium:  5.0 (02/06/2010)   Chloride:  107 (02/06/2010)   CO2:  29 (02/06/2010)   BUN:  15 (02/06/2010)   Creatinine:  0.7 (02/06/2010)   Albumin:  3.5 (02/06/2010)   Total Protein:  6.3 (02/06/2010)   Calcium:  9.4 (02/06/2010)   Total Bili:  0.6 (02/06/2010)   Alk Phos:  80 (02/06/2010)   SGPT (ALT):  16 (02/06/2010)   SGOT (AST):  18 (02/06/2010)   Medications Prior to Update: 1)  Vitamin D3 1000 Unit  Tabs (Cholecalciferol) .... 2 Once Daily Po 2)  Fish Oil   Oil (Fish Oil) .Marland Kitchen.. 1 By Mouth Bid 3)  Lorazepam 0.5 Mg Tabs (Lorazepam) .Marland Kitchen.. 1 By Mouth Two Times A Day Prn  Current Medications (verified): 1)  Vitamin D3 1000 Unit  Tabs (Cholecalciferol) .... 2 Once Daily Po 2)  Fish Oil   Oil (Fish Oil) .Marland Kitchen.. 1 By Mouth Bid 3)  Miralax  Powd (Polyethylene Glycol 3350) .... One Scoop in 8 Ouncs of Juice Once Daily As Needed For Constipation  Allergies (verified): 1)  ! * Lovastatin 2)  Fluoxetine Hcl (Fluoxetine Hcl)  Past History:  Past Medical History: Last updated: 03/01/2010 Osteopenia Anxiety Pneumonia, hx of 12/09 Colon ? year GYN Dr Ruffin [facility]  Past Surgical History: Last updated: 04/19/2008 Denies surgical history  Family History: Last updated: 03/01/2010 Family History High cholesterol S DM  S colon mass B CAD and dementia  Social History: Last updated: 12/12/2009 Retired She was looking after her GGD, now in court battle Married Never Smoked Regular exercise-yes, yoga  Risk Factors: Alcohol Use: 0 (03/22/2010) Caffeine Use: 2 (03/01/2010) Exercise: yes (03/01/2010)  Risk Factors: Smoking Status: never (03/22/2010) Passive Smoke Exposure: no (03/22/2010)  Social History: STD Risk:  no risk noted  Review of Systems       The patient complains of weight gain and abdominal pain.  The patient denies anorexia, fever, weight loss, chest pain, syncope, dyspnea on exertion,  peripheral edema, prolonged cough, headaches, hemoptysis, melena, hematochezia, severe indigestion/heartburn, hematuria, suspicious skin lesions, difficulty walking, depression, and angioedema.   GI:  Complains of abdominal pain, change in bowel habits, and constipation; denies bloody stools, dark tarry stools, diarrhea, excessive appetite, hemorrhoids, indigestion, loss of appetite, nausea, vomiting, vomiting blood, and yellowish skin color. GU:  Complains of urinary frequency; denies discharge, dysuria, hematuria, incontinence, nocturia, and urinary hesitancy. MS:  Complains of low back pain;  denies joint pain, joint redness, joint swelling, loss of strength, mid back pain, muscle aches, muscle weakness, and stiffness.  Physical Exam  General:  alert, well-developed, well-nourished, well-hydrated, appropriate dress, normal appearance, healthy-appearing, and cooperative to examination.   Head:  normocephalic, atraumatic, no abnormalities observed, and no abnormalities palpated.   Eyes:  vision grossly intact and no injection or icterus. Mouth:  Oral mucosa and oropharynx without lesions or exudates.  Teeth in good repair. Neck:  supple, full ROM, no masses, no thyromegaly, no JVD, normal carotid upstroke, no carotid bruits, no cervical lymphadenopathy, and no neck tenderness.   Lungs:  normal respiratory effort, no intercostal retractions, no accessory muscle use, normal breath sounds, no dullness, no fremitus, no crackles, and no wheezes.   Heart:  normal rate, regular rhythm, no murmur, no gallop, no rub, and no JVD.   Abdomen:  soft, non-tender, normal bowel sounds, no distention, no masses, no guarding, no rigidity, no rebound tenderness, no abdominal hernia, no inguinal hernia, no hepatomegaly, no splenomegaly, and abdominal scar(s).   Rectal:  No external abnormalities noted. Normal sphincter tone. No rectal masses or tenderness. no impaction. tiny amount of stool in RV. Msk:  normal ROM, no  joint tenderness, no joint swelling, no joint warmth, no redness over joints, no joint deformities, no joint instability, no crepitation, and no muscle atrophy.   Pulses:  R and L carotid,radial,femoral,dorsalis pedis and posterior tibial pulses are full and equal bilaterally Extremities:  No clubbing, cyanosis, edema, or deformity noted with normal full range of motion of all joints.   Neurologic:  No cranial nerve deficits noted. Station and gait are normal. Plantar reflexes are down-going bilaterally. DTRs are symmetrical throughout. Sensory, motor and coordinative functions appear intact. Skin:  turgor normal, color normal, no rashes, no suspicious lesions, no ecchymoses, no petechiae, no purpura, no ulcerations, and no edema.   Cervical Nodes:  no anterior cervical adenopathy and no posterior cervical adenopathy.   Axillary Nodes:  no R axillary adenopathy and no L axillary adenopathy.   Psych:  Cognition and judgment appear intact. Alert and cooperative with normal attention span and concentration. No apparent delusions, illusions, hallucinations   Detailed Back/Spine Exam  General:    Well-developed, well-nourished, in no acute distress; alert and oriented x 3.    Gait:    antalgic.    Lumbosacral Exam:  Inspection-deformity:    Abnormal    there is a small purple ecchymosis over the left iliac crest Palpation-spinal tenderness:  Normal Range of Motion:    Forward Flexion:   85 degrees    Hyperextension:   25 degrees    Right Lateral Bend:   25 degrees    Left Lateral Bend:   30 degrees Squatting:  normal Lying Straight Leg Raise:    Right:  negative    Left:  negative Sitting Straight Leg Raise:    Right:  negative    Left:  negative Reverse Straight Leg Raise:    Right:  negative    Left:  negative Contralateral Straight Leg Raise:    Right:  negative    Left:  negative Sciatic Notch:    There is no sciatic notch tenderness. Toe Walking:    Right:  normal     Left:  normal Heel Walking:    Right:  normal    Left:  normal   Impression & Recommendations:  Problem # 1:  LUMBAGO (ICD-724.2) Assessment New will check for fracture Orders: T-Lumbar Spine Complete, 5 Views (  71110TC)  Problem # 2:  CONSTIPATION (ICD-564.00) Assessment: New  Her updated medication list for this problem includes:    Miralax Powd (Polyethylene glycol 3350) ..... One scoop in 8 ouncs of juice once daily as needed for constipation  Orders: T-Acute Abdomen (2 view w/ PA & Chest (16109UE)  Problem # 3:  URINARY FREQUENCY, CHRONIC (ICD-788.41) Assessment: New will check for infection, blood, glucose Orders: Venipuncture (45409) TLB-CBC Platelet - w/Differential (85025-CBCD) TLB-Udip w/ Micro (81001-URINE) T- * Misc. Laboratory test [phone])  Problem # 4:  ABDOMINAL PAIN, GENERALIZED (ICD-789.07) Assessment: New will look for free air, sbo, stool retention, stones, etc Orders: Venipuncture (47829) TLB-CBC Platelet - w/Differential (85025-CBCD) TLB-Udip w/ Micro (81001-URINE) T-Acute Abdomen (2 view w/ PA & Chest (56213YQ) T- * Misc. Laboratory test [phone]) Hemoccult Guaiac-1 spec.(in office) (82270)  Complete Medication List: 1)  Vitamin D3 1000 Unit Tabs (Cholecalciferol) .... 2 once daily po 2)  Fish Oil Oil (Fish oil) .Marland Kitchen.. 1 by mouth bid 3)  Miralax Powd (Polyethylene glycol 3350) .... One scoop in 8 ouncs of juice once daily as needed for constipation  Colorectal Screening:  Current Recommendations:    Hemoccult: NEG X 1 today    Colonoscopy recommended: patient defers today but will consider in the future  Mammogram Screening:    Last Mammogram:  04/18/2009  Osteoporosis Risk Assessment:  Risk Factors for Fracture or Low Bone Density:   Race (White or Asian):     yes   Smoking status:       never  Immunization & Chemoprophylaxis:    Hepatitis B vaccine #1: HepB Adult  (06/14/2005)    Hepatitis A vaccine #1: HepA  (06/14/2005)    PPD  skin test: negative  (12/12/2009)    Tetanus vaccine: Td  (06/14/2005)    Influenza vaccine: Fluvax 3+  (02/08/2009)    Pneumovax: Historical  (02/02/2005)  Patient Instructions: 1)  Please schedule a follow-up appointment in 2 weeks. 2)  Take 650-1000mg  of Tylenol every 4-6 hours as needed for relief of pain or comfort of fever AVOID taking more than 4000mg   in a 24 hour period (can cause liver damage in higher doses). 3)  Most patients (90%) with low back pain will improve with time (2-6 weeks). Keep active but avoid activities that are painful. Apply moist heat and/or ice to lower back several times a day. Prescriptions: MIRALAX  POWD (POLYETHYLENE GLYCOL 3350) one scoop in 8 ouncs of juice once daily as needed for constipation  #30 x 11   Entered and Authorized by:   Etta Grandchild MD   Signed by:   Etta Grandchild MD on 03/22/2010   Method used:   Electronically to        [facility] [address] [phone]* (retail)       [address]       [place], [place]  [address]       Ph: [phone] or [phone]       Fax: [phone]   RxID:   [id number]    Orders Added: 1)  Venipuncture [30160] 2)  TLB-CBC Platelet - w/Differential [85025-CBCD] 3)  TLB-Udip w/ Micro [81001-URINE] 4)  T-Lumbar Spine Complete, 5 Views [71110TC] 5)  T-Acute Abdomen (2 view w/ PA & Chest [74022TC] 6)  Est. Patient Level IV [10932] 7)  T- * Misc. Laboratory test [99999] 8)  Est. Patient Level IV [35573] 9)  Hemoccult Guaiac-1 spec.(in office) [82270]

## 2010-06-01 NOTE — Assessment & Plan Note (Signed)
Summary: 3 MO ROV /NWS   Vital Signs:  Patient profile:   75 year old female Height:      62 inches Weight:      108 pounds BMI:     19.82 Temp:     97.2 degrees F oral Pulse rate:   62 / minute Pulse rhythm:   regular Resp:     16 per minute BP sitting:   120 / 80  (left arm) Cuff size:   regular  Vitals Entered By: Lanier Prude, CMA(AAMA) (December 12, 2009 8:13 AM) CC: 3 mo f/u   CC:  3 mo f/u.  History of Present Illness: The patient presents for a follow up of  anxiety, depression.  Current Medications (verified): 1)  Vitamin D3 1000 Unit  Tabs (Cholecalciferol) .... 2 Once Daily Po 2)  Fish Oil   Oil (Fish Oil) .Marland Kitchen.. 1 By Mouth Bid 3)  Lorazepam 0.5 Mg Tabs (Lorazepam) .Marland Kitchen.. 1 By Mouth Two Times A Day Prn 4)  Citalopram Hydrobromide 10 Mg Tabs (Citalopram Hydrobromide) .Marland Kitchen.. 1 By Mouth Qd  Allergies (verified): 1)  ! * Lovastatin 2)  Fluoxetine Hcl (Fluoxetine Hcl)  Past History:  Past Medical History: Last updated: 05/06/2008 Osteopenia Anxiety Pneumonia, hx of 12/09  Social History: Retired She was looking after her GGD, now in court battle Married Never Smoked Regular exercise-yes, yoga  Review of Systems       The patient complains of depression.  The patient denies fever, weight loss, chest pain, prolonged cough, and abdominal pain.    Physical Exam  General:  Well-developed,well-nourished,in no acute distress; alert,appropriate and cooperative throughout examination Nose:  External nasal examination shows no deformity or inflammation. Nasal mucosa are pink and moist without lesions or exudates. Mouth:  Oral mucosa and oropharynx without lesions or exudates.  Teeth in good repair. Lungs:  Normal respiratory effort, chest expands symmetrically. Lungs are clear to auscultation, no crackles or wheezes. Heart:  Normal rate and regular rhythm. S1 and S2 normal without gallop, murmur, click, rub or other extra sounds. Abdomen:  Bowel sounds  positive,abdomen soft and non-tender without masses, organomegaly or hernias noted. Msk:  No deformity or scoliosis noted of thoracic or lumbar spine.   Neurologic:  No cranial nerve deficits noted. Station and gait are normal. Plantar reflexes are down-going bilaterally. DTRs are symmetrical throughout. Sensory, motor and coordinative functions appear intact. Skin:  Intact without suspicious lesions or rashes Psych:  Oriented X3, normally interactive, and depressed appearing.  not suicidal.  not homicidal and tearful.     Impression & Recommendations:  Problem # 1:  DEPRESSION (ICD-311) Assessment Unchanged  Her updated medication list for this problem includes:    Lorazepam 0.5 Mg Tabs (Lorazepam) .Marland Kitchen... 1 by mouth two times a day prn    Citalopram Hydrobromide 10 Mg Tabs (Citalopram hydrobromide) .Marland Kitchen... 1 by mouth once daily not taking Risks of noncompliance with treatment discussed. Compliance encouraged.   Problem # 2:  ANXIETY (ICD-300.00) Assessment: Unchanged  Her updated medication list for this problem includes:    Lorazepam 0.5 Mg Tabs (Lorazepam) .Marland Kitchen... 1 by mouth two times a day prn    Citalopram Hydrobromide 10 Mg Tabs (Citalopram hydrobromide) .Marland Kitchen... 1 by mouth qd  Problem # 3:  ANXIETY STATE, UNSPECIFIED (ICD-300.00) Assessment: Unchanged  Her updated medication list for this problem includes:    Lorazepam 0.5 Mg Tabs (Lorazepam) .Marland Kitchen... 1 by mouth two times a day prn    Citalopram Hydrobromide 10 Mg Tabs (Citalopram  hydrobromide) .Marland Kitchen... 1 by mouth qd  Problem # 4:  Form filled out Assessment: New PPD OK to work w/kids  Complete Medication List: 1)  Vitamin D3 1000 Unit Tabs (Cholecalciferol) .... 2 once daily po 2)  Fish Oil Oil (Fish oil) .Marland Kitchen.. 1 by mouth bid 3)  Lorazepam 0.5 Mg Tabs (Lorazepam) .Marland Kitchen.. 1 by mouth two times a day prn 4)  Citalopram Hydrobromide 10 Mg Tabs (Citalopram hydrobromide) .Marland Kitchen.. 1 by mouth qd  Other Orders: TB Skin Test [id number]) Admin 1st  Vaccine (47829)  Patient Instructions: 1)  Please schedule a follow-up appointment in 2 months well w/labs. 2)  Please start taking Citalopram daily!   Immunizations Administered:  PPD Skin Test:    Vaccine Type: PPD    Site: right forearm    Mfr: Sanofi Pasteur    Dose: 0.1 ml    Route: ID    Given by: Lanier Prude, CMA(AAMA)    Exp. Date: 09/25/2011    Lot #: [id number]  PPD Results    Date of reading: 12/14/2009    Results: < 5mm    Interpretation: negative   Orders Added: 1)  Est. Patient Level IV [65784] 2)  TB Skin Test [86580] 3)  Admin 1st Vaccine [69629]

## 2010-06-01 NOTE — Miscellaneous (Signed)
  Clinical Lists Changes  Medications: Added new medication of CIPRO 250 MG TABS (CIPROFLOXACIN HCL) 1 by mouth two times a day - Signed Rx of CIPRO 250 MG TABS (CIPROFLOXACIN HCL) 1 by mouth two times a day;  #10 x 0;  Signed;  Entered by: Lanier Prude, CMA(AAMA);  Authorized by: Tresa Garter MD;  Method used: Electronically to [facility] [address] [phone]*, [address] [place], [place], [place]  [address], Ph: [phone] or [phone], Fax: [phone]    Prescriptions: CIPRO 250 MG TABS (CIPROFLOXACIN HCL) 1 by mouth two times a day  #10 x 0   Entered by:   Lanier Prude, CMA(AAMA)   Authorized by:   Tresa Garter MD   Signed by:   Lanier Prude, [facility]) on 02/07/2010   Method used:   Electronically to        [facility] [address] [phone]* (retail)       [address]       [place], [place]  [address]       Ph: [phone] or [phone]       Fax: [phone]   RxID:   [id number]

## 2010-06-01 NOTE — Progress Notes (Signed)
Summary: Staff Medical Report/Patient  Staff Medical Report/Patient   Imported By: Sherian Rein 12/15/2009 14:06:37  _____________________________________________________________________  External Attachment:    Type:   Image     Comment:   External Document

## 2010-06-01 NOTE — Assessment & Plan Note (Signed)
Summary: 2 MO ROV /NWS  #   Vital Signs:  Patient profile:   75 year old female Height:      62 inches Weight:      110 pounds BMI:     20.19 O2 Sat:      97 % on Room air Temp:     98.1 degrees F oral Pulse rate:   68 / minute BP sitting:   114 / 70  (left arm) Cuff size:   regular  O2 Flow:  Room air CC: 2 mo rtn ov./kb Is Patient Diabetic? No Pain Assessment Patient in pain? no        CC:  2 mo rtn ov./kb.  History of Present Illness: The patient presents for a follow up of stress anxiety, depression and headaches.   Current Medications (verified): 1)  Vitamin D3 1000 Unit  Tabs (Cholecalciferol) .... 2 Once Daily Po 2)  Fish Oil   Oil (Fish Oil) .Marland Kitchen.. 1 By Mouth Bid 3)  Lorazepam 0.5 Mg Tabs (Lorazepam) .Marland Kitchen.. 1 By Mouth Two Times A Day Prn 4)  Fluoxetine Hcl 10 Mg Tabs (Fluoxetine Hcl) .Marland Kitchen.. 1 By Mouth Once Daily For Stress  Allergies (verified): 1)  ! * Lovastatin 2)  Fluoxetine Hcl (Fluoxetine Hcl)  Review of Systems  The patient denies chest pain and dyspnea on exertion.    Physical Exam  General:  Well-developed,well-nourished,in no acute distress; alert,appropriate and cooperative throughout examination Nose:  External nasal examination shows no deformity or inflammation. Nasal mucosa are pink and moist without lesions or exudates. Mouth:  Oral mucosa and oropharynx without lesions or exudates.  Teeth in good repair. Lungs:  Normal respiratory effort, chest expands symmetrically. Lungs are clear to auscultation, no crackles or wheezes. Heart:  Normal rate and regular rhythm. S1 and S2 normal without gallop, murmur, click, rub or other extra sounds. Abdomen:  Bowel sounds positive,abdomen soft and non-tender without masses, organomegaly or hernias noted. Msk:  No deformity or scoliosis noted of thoracic or lumbar spine.   Neurologic:  No cranial nerve deficits noted. Station and gait are normal. Plantar reflexes are down-going bilaterally. DTRs are  symmetrical throughout. Sensory, motor and coordinative functions appear intact. Skin:  Intact without suspicious lesions or rashes Psych:  Oriented X3, normally interactive, and not depressed appearing.  not suicidal.     Impression & Recommendations:  Problem # 1:  WEIGHT LOSS, ABNORMAL (ICD-783.21) Assessment Improved  Problem # 2:  DEPRESSION (ICD-311) Assessment: Unchanged  The following medications were removed from the medication list:    Fluoxetine Hcl 10 Mg Tabs (Fluoxetine hcl) .Marland Kitchen... 1 by mouth once daily for stress Her updated medication list for this problem includes:    Lorazepam 0.5 Mg Tabs (Lorazepam) .Marland Kitchen... 1 by mouth two times a day prn    Citalopram Hydrobromide 10 Mg Tabs (Citalopram hydrobromide) .Marland Kitchen... 1 by mouth qd  Problem # 3:  ANXIETY STATE, UNSPECIFIED (ICD-300.00) Assessment: Unchanged  The following medications were removed from the medication list:    Fluoxetine Hcl 10 Mg Tabs (Fluoxetine hcl) .Marland Kitchen... 1 by mouth once daily for stress Her updated medication list for this problem includes:    Lorazepam 0.5 Mg Tabs (Lorazepam) .Marland Kitchen... 1 by mouth two times a day prn    Citalopram Hydrobromide 10 Mg Tabs (Citalopram hydrobromide) .Marland Kitchen... 1 by mouth qd  Problem # 4:  ALLERGY (ICD-995.3)  Problem # 5:  OSTEOPENIA (ICD-733.90) Assessment: New  Complete Medication List: 1)  Vitamin D3 1000 Unit Tabs (Cholecalciferol) .Marland KitchenMarland KitchenMarland Kitchen  2 once daily po 2)  Fish Oil Oil (Fish oil) .Marland Kitchen.. 1 by mouth bid 3)  Lorazepam 0.5 Mg Tabs (Lorazepam) .Marland Kitchen.. 1 by mouth two times a day prn 4)  Citalopram Hydrobromide 10 Mg Tabs (Citalopram hydrobromide) .Marland Kitchen.. 1 by mouth qd  Patient Instructions: 1)  Please schedule a follow-up appointment in 3 months. Prescriptions: CITALOPRAM HYDROBROMIDE 10 MG TABS (CITALOPRAM HYDROBROMIDE) 1 by mouth qd  #30 x 6   Entered and Authorized by:   Tresa Garter MD   Signed by:   Tresa Garter MD on 09/12/2009   Method used:   Print then Give to  Patient   RxID:   [id number]

## 2010-06-01 NOTE — Progress Notes (Signed)
Summary: lost rx's  Phone Note Call from Patient Call back at Home Phone [phone]   Summary of Call: Patient left message on triage stating that she was given two prescriptions at her last office visit and never had them filled. Patient would now like to begin these prescriptions, but cannot locate them. Patient would like them called into pharmacy. Please advise. Initial call taken by: Lucious Groves,  June 29, 2009 1:08 PM  Follow-up for Phone Call        OK to renew Follow-up by: Tresa Garter MD,  June 29, 2009 3:55 PM  Additional Follow-up for Phone Call Additional follow up Details #1::        Patient notified per MD and rx called in Additional Follow-up by: Rock Nephew CMA,  June 29, 2009 5:07 PM    Prescriptions: FLUOXETINE HCL 10 MG  CAPS (FLUOXETINE HCL) Take 1 capsule by mouth daily  #90 x 1   Entered by:   Rock Nephew CMA   Authorized by:   Tresa Garter MD   Signed by:   Rock Nephew CMA on 06/29/2009   Method used:   Telephoned to ...       [facility]  [address] [phone]* (retail)       [address]       [place], [place]  [address]       Ph: [phone] or [phone]       Fax: [phone]   RxID:   [id number] LORAZEPAM 0.5 MG TABS (LORAZEPAM) 1 by mouth two times a day prn  #60 x 3   Entered by:   Rock Nephew CMA   Authorized by:   Tresa Garter MD   Signed by:   Rock Nephew CMA on 06/29/2009   Method used:   Telephoned to ...       [facility]  [address] [phone]* (retail)       [address]       [place], [place]  [address]       Ph: [phone] or [phone]       Fax: [phone]   RxID:   [id number]

## 2010-06-01 NOTE — Assessment & Plan Note (Signed)
Summary: YEARLY FU/ MEDICARE/NWS  #   Vital Signs:  Patient profile:   75 year old female Height:      62 inches Weight:      109 pounds BMI:     20.01 Temp:     98.5 degrees F oral Pulse rate:   64 / minute Pulse rhythm:   regular Resp:     16 per minute BP sitting:   110 / 68  (left arm) Cuff size:   regular  Vitals Entered By: Lanier Prude, CMA(AAMA) (March 01, 2010 10:41 AM) CC: MWV  Is Patient Diabetic? No Comments pt is not taking Lorazepam   CC:  MWV .  History of Present Illness: The patient presents for a preventive health examination  Patient past medical history, social history, and family history reviewed in detail no significant changes.  Patient is physically active. Depression is negative and mood is good. Hearing is normal, and able to perform activities of daily living. Risk of falling is negligible and home safety has been reviewed and is appropriate. Patient has normal height, weight, and visual acuity. Patient has been counseled on age-appropriate routine health concerns for screening and prevention. Education, counseling done.  C/o constipation and tightness in stomach w/Citalopram  Preventive Screening-Counseling & Management  Alcohol-Tobacco     Alcohol drinks/day: 0     Smoking Status: never  Caffeine-Diet-Exercise     Caffeine use/day: 2     Does Patient Exercise: yes     Type of exercise: yoga      Times/week: <3  Hep-HIV-STD-Contraception     Hepatitis Risk: no risk noted     Dental Visit-last 6 months no     Sun Exposure-Excessive: no  Safety-Violence-Falls     Seat Belt Use: yes     Helmet Use: n/a     Firearms in the Home: firearms in the home     Smoke Detectors: yes     Violence in the Home: no risk noted     Sexual Abuse: no     Fall Risk: steps outside home      Sexual History:  currently monogamous.    Current Medications (verified): 1)  Vitamin D3 1000 Unit  Tabs (Cholecalciferol) .... 2 Once Daily Po 2)  Fish Oil    Oil (Fish Oil) .Marland Kitchen.. 1 By Mouth Bid 3)  Lorazepam 0.5 Mg Tabs (Lorazepam) .Marland Kitchen.. 1 By Mouth Two Times A Day Prn 4)  Citalopram Hydrobromide 10 Mg Tabs (Citalopram Hydrobromide) .Marland Kitchen.. 1 By Mouth Qd  Allergies (verified): 1)  ! * Lovastatin 2)  Fluoxetine Hcl (Fluoxetine Hcl)  Past History:  Past Surgical History: Last updated: 04/19/2008 Denies surgical history  Family History: Last updated: 03/01/2010 Family History High cholesterol S DM  S colon mass B CAD and dementia  Social History: Last updated: 12/12/2009 Retired She was looking after her GGD, now in court battle Married Never Smoked Regular exercise-yes, yoga  Past Medical History: Osteopenia Anxiety Pneumonia, hx of 12/09 Colon ? year GYN Dr Ruffin Pyo at [facility]  Family History: Family History High cholesterol S DM  S colon mass B CAD and dementia  Social History: Caffeine use/day:  2 Dental Care w/in 6 mos.:  no Sun Exposure-Excessive:  no Seat Belt Use:  yes Fall Risk:  steps outside home Hepatitis Risk:  no risk noted Sexual History:  currently monogamous  Physical Exam  General:  Well-developed,well-nourished,in no acute distress; alert,appropriate and cooperative throughout examination Ears:  B wax L  OE Nose:  External nasal examination shows no deformity or inflammation. Nasal mucosa are pink and moist without lesions or exudates. Mouth:  Oral mucosa and oropharynx without lesions or exudates.  Teeth in good repair. Neck:  No deformities, masses, or tenderness noted. Lungs:  Normal respiratory effort, chest expands symmetrically. Lungs are clear to auscultation, no crackles or wheezes. Heart:  Normal rate and regular rhythm. S1 and S2 normal without gallop, murmur, click, rub or other extra sounds. Abdomen:  Bowel sounds positive,abdomen soft and non-tender without masses, organomegaly or hernias noted. Genitalia:  Normal introitus for age, no external lesions, no vaginal discharge,  mucosa pink and moist, no vaginal or cervical lesions, no vaginal atrophy, no friaility or hemorrhage, normal uterus size and position, no adnexal masses or tenderness Msk:  No deformity or scoliosis noted of thoracic or lumbar spine.   Neurologic:  No cranial nerve deficits noted. Station and gait are normal. Plantar reflexes are down-going bilaterally. DTRs are symmetrical throughout. Sensory, motor and coordinative functions appear intact. Skin:  Intact without suspicious lesions or rashes Psych:  Cognition and judgment appear intact. Alert and cooperative with normal attention span and concentration. No apparent delusions, illusions, hallucinations   Impression & Recommendations:  Problem # 1:  ROUTINE GENERAL MEDICAL EXAM@HEALTH  CARE FACL (ICD-V70.0) Assessment New The labs were reviewed with the patient.  Orders: EKG w/ Interpretation (93000) ok Overall doing well, age appropriate education and counseling updated and referral for appropriate preventive services done unless declined, immunizations up to date or declined, diet counseling done if overweight, urged to quit smoking if smokes, most recent labs reviewed and current ordered if appropriate, ecg reviewed or declined (interpretation per ECG scanned in the EMR if done); information regarding Medicare Prevention requirements given if appropriate.  Problem # 2:  DEPRESSION (ICD-311) Assessment: Improved  The following medications were removed from the medication list:    Citalopram Hydrobromide 10 Mg Tabs (Citalopram hydrobromide) .Marland Kitchen... 1 by mouth qd Her updated medication list for this problem includes:    Lorazepam 0.5 Mg Tabs (Lorazepam) .Marland Kitchen... 1 by mouth two times a day prn  Problem # 3:  OTITIS EXTERNA (ICD-380.10) L Assessment: New Wax was removed Her updated medication list for this problem includes:    Cortisporin 3.5-10000-1 Soln (Neomycin-polymyxin-hc) .Marland KitchenMarland KitchenMarland KitchenMarland Kitchen 3 gtt in r ear tid  Problem # 4:  CERUMEN IMPACTION  (ICD-380.4) B Assessment: New Procedure: ear irrigation Reason: wax impaction Risks/benefis were discussed. Both ears were irrigated with warm water. Large ammount of wax was recovered. Instrumentation with metal ear loop was performed to accomplish the removal. Tolerated well Complications: none    Complete Medication List: 1)  Vitamin D3 1000 Unit Tabs (Cholecalciferol) .... 2 once daily po 2)  Fish Oil Oil (Fish oil) .Marland Kitchen.. 1 by mouth bid 3)  Lorazepam 0.5 Mg Tabs (Lorazepam) .Marland Kitchen.. 1 by mouth two times a day prn 4)  Cortisporin 3.5-10000-1 Soln (Neomycin-polymyxin-hc) .... 3 gtt in r ear tid  Other Orders: T-Bone Densitometry [phone]) Medicare -1st Annual Wellness Visit [phone]) Cerumen Impaction Removal (09811)  Patient Instructions: 1)  Please schedule a follow-up appointment in 4 months. Prescriptions: CORTISPORIN 3.5-10000-1 SOLN (NEOMYCIN-POLYMYXIN-HC) 3 gtt in R ear tid  #1 x 1   Entered and Authorized by:   Tresa Garter MD   Signed by:   Tresa Garter MD on 03/01/2010   Method used:   Electronically to        [facility] [address] [phone]* (retail)  [address]       [place], [place]  [address]       Ph: [phone] or [phone]       Fax: [phone]   RxID:   [id number]    Orders Added: 1)  EKG w/ Interpretation [93000] 2)  T-Bone Densitometry [77080] 3)  Medicare -1st Annual Wellness Visit [G0438] 4)  Cerumen Impaction Removal [69210]   Immunization History:  Pneumovax Immunization History:    Pneumovax:  historical (02/02/2005)   Immunization History:  Pneumovax Immunization History:    Pneumovax:  Historical (02/02/2005)

## 2010-06-01 NOTE — Assessment & Plan Note (Signed)
Summary: COUGH/ CONGESTION/ HOARSE/ NO FEVER/NWS   Vital Signs:  Patient profile:   75 year old female Menstrual status:  hysterectomy Height:      62 inches (157.48 cm) Weight:      112 pounds (50.91 kg) O2 Sat:      96 % on Room air Temp:     98.6 degrees F (37.00 degrees C) oral Pulse rate:   62 / minute BP sitting:   138 / 70  (left arm) Cuff size:   regular  Vitals Entered By: Orlan Leavens RMA (April 25, 2010 2:35 PM)  O2 Flow:  Room air CC: COUGH & CHEST CONGESTION, URI symptoms Is Patient Diabetic? No Pain Assessment Patient in pain? no        Primary Care Provider:  Tresa Garter MD  CC:  COUGH & CHEST CONGESTION and URI symptoms.  History of Present Illness:  URI Symptoms      This is a 75 year old woman who presents with URI symptoms.  The symptoms began 3 days ago.  The severity is described as moderate.  The patient reports clear nasal discharge, sore throat, productive cough, and earache, but denies sick contacts.  Associated symptoms include low-grade fever (<100.5 degrees).  The patient denies stiff neck, dyspnea, wheezing, rash, vomiting, and diarrhea.  The patient also reports muscle aches and severe fatigue.  The patient denies sneezing, seasonal symptoms, and headache.  The patient denies the following risk factors for Strep sinusitis: tooth pain and Strep exposure.    Current Medications (verified): 1)  Vitamin D3 1000 Unit  Tabs (Cholecalciferol) .... 2 Once Daily Po 2)  Fish Oil   Oil (Fish Oil) .Marland Kitchen.. 1 By Mouth Bid 3)  Miralax  Powd (Polyethylene Glycol 3350) .... One Scoop in 8 Ouncs of Juice Once Daily As Needed For Constipation  Allergies (verified): 1)  ! * Lovastatin 2)  Fluoxetine Hcl (Fluoxetine Hcl)  Past History:  Past Medical History: Osteopenia Anxiety Pneumonia, hx of 12/09 Colon ? year  GYN Dr Ruffin Pyo at [facility]  Review of Systems  The patient denies chest pain, syncope, and headaches.    Physical  Exam  General:  alert, well-developed, well-nourished, and cooperative to examination.   nontoxic Head:  Normocephalic and atraumatic without obvious abnormalities. No apparent alopecia or balding. Eyes:  vision grossly intact and no injection or icterus. Ears:  mildly red L TM, no effusion- right ear normal Mouth:  teeth and gums in good repair; mucous membranes moist, without lesions or ulcers. oropharynx clear without exudate, no erythema.  Lungs:  few rhonchi bilaterally, no wheeze or inc WOB Heart:  normal rate, regular rhythm, no murmur, and no rub. BLE without edema.  Neurologic:  alert & oriented X3 and cranial nerves II-XII symetrically intact.  strength normal in all extremities, sensation intact to light touch, and gait normal. speech fluent without dysarthria or aphasia; follows commands with good comprehension.    Impression & Recommendations:  Problem # 1:  ACUTE BRONCHITIS (ICD-466.0)  Her updated medication list for this problem includes:    Azithromycin 250 Mg Tabs (Azithromycin) .Marland Kitchen... 2 tabs by mouth today, then 1 by mouth daily starting tomorrow  Take antibiotics and other medications as directed. Encouraged to push clear liquids, get enough rest, and take acetaminophen as needed. To be seen in 5-7 days if no improvement, sooner if worse.  Orders: Prescription Created Electronically ([id number])  Complete Medication List: 1)  Vitamin D3 1000 Unit Tabs (Cholecalciferol) .Marland KitchenMarland KitchenMarland Kitchen  2 once daily po 2)  Fish Oil Oil (Fish oil) .Marland Kitchen.. 1 by mouth bid 3)  Miralax Powd (Polyethylene glycol 3350) .... One scoop in 8 ouncs of juice once daily as needed for constipation 4)  Azithromycin 250 Mg Tabs (Azithromycin) .... 2 tabs by mouth today, then 1 by mouth daily starting tomorrow  Patient Instructions: 1)  it was good to see you today.  2)  zpak for your symptoms - your prescriptions have been electronically submitted to your pharmacy. Please take as directed. Contact our office if you  believe you're having problems with the medication(s).  3)  Get plenty of rest, drink lots of clear liquids, and use Tylenol or Ibuprofen for fever and comfort. Return in 7-10 days if you're not better:sooner if you're feeling worse. Prescriptions: AZITHROMYCIN 250 MG TABS (AZITHROMYCIN) 2 tabs by mouth today, then 1 by mouth daily starting tomorrow  #6 x 0   Entered and Authorized by:   Newt Lukes MD   Signed by:   Newt Lukes MD on 04/25/2010   Method used:   Electronically to        [facility] [address] [phone]* (retail)       [address]       [place], [place]  [address]       Ph: [phone] or [phone]       Fax: [phone]   RxID:   [id number]    Orders Added: 1)  Est. Patient Level IV [66440] 2)  Prescription Created Electronically [phone]

## 2010-06-15 ENCOUNTER — Ambulatory Visit (INDEPENDENT_AMBULATORY_CARE_PROVIDER_SITE_OTHER)
Admission: RE | Admit: 2010-06-15 | Discharge: 2010-06-15 | Disposition: A | Discharge status: Home / Self Care | Payer: Medicare Other | Source: Ambulatory Visit | Attending: Internal Medicine | Admitting: Internal Medicine

## 2010-06-15 ENCOUNTER — Ambulatory Visit (INDEPENDENT_AMBULATORY_CARE_PROVIDER_SITE_OTHER): Payer: Medicare Other | Admitting: Internal Medicine

## 2010-06-15 ENCOUNTER — Encounter: Payer: Self-pay | Admitting: Internal Medicine

## 2010-06-15 ENCOUNTER — Other Ambulatory Visit: Payer: Self-pay | Admitting: Internal Medicine

## 2010-06-15 ENCOUNTER — Other Ambulatory Visit: Payer: Medicare Other

## 2010-06-15 DIAGNOSIS — R05 Cough: Secondary | ICD-10-CM

## 2010-06-15 DIAGNOSIS — R079 Chest pain, unspecified: Secondary | ICD-10-CM

## 2010-06-15 LAB — CBC WITH DIFFERENTIAL/PLATELET
Basophils Absolute: 0 10*3/uL (ref 0.0–0.1)
Basophils Relative: 0.2 % (ref 0.0–3.0)
Eosinophils Absolute: 0 10*3/uL (ref 0.0–0.7)
Eosinophils Relative: 0.1 % (ref 0.0–5.0)
HCT: 34.4 % — ABNORMAL LOW (ref 36.0–46.0)
Hemoglobin: 11.7 g/dL — ABNORMAL LOW (ref 12.0–15.0)
Lymphocytes Relative: 5.3 % — ABNORMAL LOW (ref 12.0–46.0)
Lymphs Abs: 0.8 10*3/uL (ref 0.7–4.0)
MCHC: 34.1 g/dL (ref 30.0–36.0)
MCV: 94.9 fl (ref 78.0–100.0)
Monocytes Absolute: 0.9 10*3/uL (ref 0.1–1.0)
Monocytes Relative: 6.3 % (ref 3.0–12.0)
Neutro Abs: 12.9 10*3/uL — ABNORMAL HIGH (ref 1.4–7.7)
Neutrophils Relative %: 88.1 % — ABNORMAL HIGH (ref 43.0–77.0)
Platelets: 208 10*3/uL (ref 150.0–400.0)
RBC: 3.62 Mil/uL — ABNORMAL LOW (ref 3.87–5.11)
RDW: 12.8 % (ref 11.5–14.6)
WBC: 14.7 10*3/uL — ABNORMAL HIGH (ref 4.5–10.5)

## 2010-06-15 LAB — BASIC METABOLIC PANEL
BUN: 22 mg/dL (ref 6–23)
CO2: 26 mEq/L (ref 19–32)
Calcium: 9 mg/dL (ref 8.4–10.5)
Chloride: 107 mEq/L (ref 96–112)
Creatinine, Ser: 0.6 mg/dL (ref 0.4–1.2)
GFR: 98.65 mL/min (ref >60.00)
Glucose, Bld: 97 mg/dL (ref 70–99)
Potassium: 4.3 mEq/L (ref 3.5–5.1)
Sodium: 140 mEq/L (ref 135–145)

## 2010-06-21 NOTE — Assessment & Plan Note (Signed)
Summary: cough / pain in L rib cage /nws   Vital Signs:  Patient profile:   75 year old female Menstrual status:  hysterectomy O2 Sat:      95 % on Room air Temp:     98.9 degrees F (37.17 degrees C) oral Pulse rate:   78 / minute BP sitting:   120 / 62  (left arm) Cuff size:   regular  Vitals Entered By: Orlan Leavens RMA (June 15, 2010 11:01 AM)  O2 Flow:  Room air CC: Cough/ hurt (L) ribs Pain Assessment Patient in pain? yes     Location: (L) ribs Type: aching   Primary Care Provider:  Tresa Garter MD  CC:  Cough/ hurt (L) ribs.  History of Present Illness:  Cough      This is a 75 year old woman who presents with Cough.  The symptoms began 1 week ago.  The intensity is described as moderate.  cough progressive over past few days to progressive intensity but now with left side rib pain during cough since this AM (<12h). no trauam or injury to chest wall - no "pop" during cough spasm. Now painful with any cough effort.  The patient reports productive cough, non-productive cough, pleuritic chest pain, shortness of breath, and exertional dyspnea, but denies wheezing, fever, and hemoptysis.  Associated symtpoms include nasal congestion.  The patient denies the following symptoms: cold/URI symptoms, sore throat, weight loss, acid reflux symptoms, and peripheral edema.  The cough is worse with activity.  Ineffective prior treatments have included OTC cough medication.  Diagnostic testing to date has included CXR.    Current Medications (verified): 1)  Vitamin D3 1000 Unit  Tabs (Cholecalciferol) .... 2 Once Daily Po 2)  Fish Oil   Oil (Fish Oil) .Marland Kitchen.. 1 By Mouth Bid  Allergies (verified): 1)  ! * Lovastatin 2)  Fluoxetine Hcl (Fluoxetine Hcl)  Past History:  Past Medical History: Osteopenia Anxiety Pneumonia, hx of 12/09  Colon ? year  GYN Dr Ruffin Pyo at [facility]  Review of Systems  The patient denies weight loss, syncope, headaches, hemoptysis, and  abdominal pain.    Physical Exam  General:  alert, well-developed, well-nourished, and cooperative to examination.   nontoxic but uncomfortable Chest Wall:  no pain to palp over left chest wall Lungs:  few rhonchi bilaterally L>R, no wheeze or inc WOB Heart:  normal rate, regular rhythm, no murmur, and no rub. BLE without edema.    Impression & Recommendations:  Problem # 1:  COUGH (ICD-786.2)  ongoing cough x 7 days, now acute left chest pain <12h - pleuritic afeb, O2 sat ok and no tachycard but rhonchi on exam hx lgf and prior PNA - tx FQ + tussionex and check cxr now also cbc and Cr - if sx unimproved with tx consider further testing, sooner if worse - pt willnotify [place] of same explained same to pt who understands and agrees tordol today for acute pain  Orders: Prescription Created Electronically [id number]) TLB-CBC Platelet - w/Differential (85025-CBCD) TLB-BMP (Basic Metabolic Panel-BMET) (80048-METABOL) T-2 View CXR (71020TC)  Problem # 2:  RIB PAIN, LEFT SIDED (ICD-786.50)  see above discussion for cough  Orders: Prescription Created Electronically (U0454) TLB-CBC Platelet - w/Differential (85025-CBCD) TLB-BMP (Basic Metabolic Panel-BMET) (80048-METABOL) T-2 View CXR (71020TC) Admin of Therapeutic Inj  intramuscular or subcutaneous (09811) Ketorolac-Toradol 15mg  (B1478)  Problem # 3:  ACUTE BRONCHITIS (ICD-466.0)  Her updated medication list for this problem includes:    Levaquin  500 Mg Tabs (Levofloxacin) .Marland Kitchen... 1 by mouth once daily x 7 days    Tussionex Pennkinetic Er 10-8 Mg/47ml Lqcr (Hydrocod polst-chlorphen polst) .Marland Kitchen... 1 tsp by mouth every 12h as needed for cough symptoms  Take antibiotics and other medications as directed. Encouraged to push clear liquids, get enough rest, and take acetaminophen as needed. To be seen in 5-7 days if no improvement, sooner if worse.  Complete Medication List: 1)  Vitamin D3 1000 Unit Tabs (Cholecalciferol) .... 2 once daily  po 2)  Fish Oil Oil (Fish oil) .Marland Kitchen.. 1 by mouth bid 3)  Levaquin 500 Mg Tabs (Levofloxacin) .Marland Kitchen.. 1 by mouth once daily x 7 days 4)  Tussionex Pennkinetic Er 10-8 Mg/56ml Lqcr (Hydrocod polst-chlorphen polst) .Marland Kitchen.. 1 tsp by mouth every 12h as needed for cough symptoms  Patient Instructions: 1)  it was good to see you today. 2)  test(s) ordered today - your results will be called to you after review in 48-72 hours from the time of test completion 3)  Levaquin antibioitcs and Tussionex - your prescriptions have been submitted to your pharmacy. Please take as directed. Contact our office if you believe you're having problems with the medication(s). 4)  tordol shot today in office for pain symptoms   5)  Get plenty of rest, drink lots of clear liquids, and use Tylenol or Ibuprofen for comfort. Return in 7-10 days if you're not better:sooner if you're feeling worse. Prescriptions: TUSSIONEX PENNKINETIC ER 10-8 MG/5ML LQCR (HYDROCOD POLST-CHLORPHEN POLST) 1 tsp by mouth every 12h as needed for cough symptoms  #100cc x 0   Entered and Authorized by:   Newt Lukes MD   Signed by:   Newt Lukes MD on 06/15/2010   Method used:   Printed then faxed to ...       [facility]  [address] [phone]* (retail)       [address]       [place], [place]  [address]       Ph: [phone] or [phone]       Fax: [phone]   RxID:   [id number] LEVAQUIN 500 MG TABS (LEVOFLOXACIN) 1 by mouth once daily x 7 days  #7 x 0   Entered and Authorized by:   Newt Lukes MD   Signed by:   Newt Lukes MD on 06/15/2010   Method used:   Electronically to        [address] [phone]* (retail)       [address]       [place], [place]  [address]       Ph: [phone] or [phone]       Fax: [phone]   RxID:   [id number]    Medication Administration  Injection # 1:    Medication: Ketorolac-Toradol 15mg      Diagnosis: RIB PAIN, LEFT SIDED (ICD-786.50)    Route: IM    Site: RUOQ gluteus    Exp Date: 05/01/2011    Lot #: [id number]    Mfr: Novaplus    Comments: administered 30mg  of Toradol (1ml)    Patient tolerated injection without complications    Given by: Brenton Grills CMA Duncan Dull) (June 15, 2010 11:51 AM)  Orders Added: 1)  Est. Patient Level IV [28315] 2)  Prescription Created Electronically [G8553] 3)  TLB-CBC Platelet - w/Differential [85025-CBCD] 4)  TLB-BMP (  Basic Metabolic Panel-BMET) [80048-METABOL] 5)  T-2 View CXR [71020TC] 6)  Admin of Therapeutic Inj  intramuscular or subcutaneous [96372] 7)  Ketorolac-Toradol 15mg  [J1885]

## 2010-06-30 ENCOUNTER — Encounter: Payer: Self-pay | Admitting: Internal Medicine

## 2010-06-30 ENCOUNTER — Ambulatory Visit (INDEPENDENT_AMBULATORY_CARE_PROVIDER_SITE_OTHER): Payer: Medicare Other | Admitting: Internal Medicine

## 2010-06-30 DIAGNOSIS — F411 Generalized anxiety disorder: Secondary | ICD-10-CM

## 2010-06-30 DIAGNOSIS — F329 Major depressive disorder, single episode, unspecified: Secondary | ICD-10-CM

## 2010-06-30 DIAGNOSIS — J40 Bronchitis, not specified as acute or chronic: Secondary | ICD-10-CM | POA: Insufficient documentation

## 2010-07-11 NOTE — Assessment & Plan Note (Signed)
Summary: 4 MO FU/STC   Vital Signs:  Patient profile:   75 year old female Menstrual status:  hysterectomy Height:      62 inches (157.48 cm) Weight:      110.75 pounds (50.34 kg) BMI:     20.33 O2 Sat:      98 % on Room air Temp:     98.3 degrees F (36.83 degrees C) oral Pulse rate:   92 / minute Resp:     14 per minute BP sitting:   106 / 58  (right arm) Cuff size:   regular  Vitals Entered By: Burnard Leigh CMA(AAMA) (June 30, 2010 10:36 AM)  O2 Flow:  Room air CC: 4-mth F/U.Pt c/o extreme fatigue & weakness/skls,cma Is Patient Diabetic? No Comments Pt states she is doing PT for back, neck, Left shouldere & arm   Primary Care Provider:  Tresa Garter MD  CC:  4-mth F/U.Pt c/o extreme fatigue & weakness/skls and cma.  History of Present Illness: The patient presents with complaints of sore throat, fever, cough, sinus congestion and drainge of seven or so days duration. Not better with OTC meds. Chest hurts with coughing. Can't sleep due to cough. Muscle aches are present.  The mucus is not colored. She is better after antibiotic  C/o fatigue, better C/o LBP - better with PT  Current Medications (verified): 1)  Vitamin D3 1000 Unit  Tabs (Cholecalciferol) .... 2 Once Daily Po 2)  Fish Oil   Oil (Fish Oil) .Marland Kitchen.. 1 By Mouth Bid 3)  Tussionex Pennkinetic Er 10-8 Mg/51ml Lqcr (Hydrocod Polst-Chlorphen Polst) .Marland Kitchen.. 1 Tsp By Mouth Every 12h As Needed For Cough Symptoms  Allergies (verified): 1)  ! * Lovastatin 2)  Fluoxetine Hcl (Fluoxetine Hcl)  Past History:  Past Medical History: Last updated: 06/15/2010 Osteopenia Anxiety Pneumonia, hx of 12/09  Colon ? year  GYN Dr Ruffin Pyo at [facility]  Family History: Last updated: 03/01/2010 Family History High cholesterol S DM  S colon mass B CAD and dementia  Social History: Last updated: 12/12/2009 Retired She was looking after her GGD, now in court battle Married Never Smoked Regular exercise-yes,  yoga  Review of Systems  The patient denies fever, dyspnea on exertion, prolonged cough, abdominal pain, severe indigestion/heartburn, and difficulty walking.    Physical Exam  General:  alert, well-developed, well-nourished, and cooperative to examination.   nontoxic but uncomfortable Head:  Normocephalic and atraumatic without obvious abnormalities. No apparent alopecia or balding. Nose:  External nasal examination shows no deformity or inflammation. Nasal mucosa are pink and moist without lesions or exudates. Mouth:  teeth and gums in good repair; mucous membranes moist, without lesions or ulcers. oropharynx clear without exudate, no erythema.  Lungs:  few rhonchi bilaterally L>R, no wheeze or inc WOB Heart:  normal rate, regular rhythm, no murmur, and no rub. BLE without edema.  Abdomen:  soft, non-tender, normal bowel sounds, no distention, no masses, no guarding, no rigidity, no rebound tenderness, no abdominal hernia, no inguinal hernia, no hepatomegaly, no splenomegaly, and abdominal scar(s).   Genitalia:  Normal introitus for age, no external lesions, no vaginal discharge, mucosa pink and moist, no vaginal or cervical lesions, no vaginal atrophy, no friaility or hemorrhage, normal uterus size and position, no adnexal masses or tenderness Msk:  normal ROM, no joint tenderness, no joint swelling, no joint warmth, no redness over joints, no joint deformities, no joint instability, no crepitation, and no muscle atrophy.   Neurologic:  alert &  oriented X3 and cranial nerves II-XII symetrically intact.  strength normal in all extremities, sensation intact to light touch, and gait normal. speech fluent without dysarthria or aphasia; follows commands with good comprehension.  Skin:  turgor normal, color normal, no rashes, no suspicious lesions, no ecchymoses, no petechiae, no purpura, no ulcerations, and no edema.   Psych:  Cognition and judgment appear intact. Alert and cooperative with normal  attention span and concentration. No apparent delusions, illusions, hallucinations   Impression & Recommendations:  Problem # 1:  BRONCHITIS (ICD-490) Assessment Improved  Problem # 2:  DEPRESSION (ICD-311) Assessment: Improved Off Rx  Problem # 3:  ANXIETY STATE, UNSPECIFIED (ICD-300.00) Assessment: Improved  Problem # 4:  WEIGHT LOSS, ABNORMAL (ICD-783.21) Assessment: Unchanged Diet discussed  Complete Medication List: 1)  Vitamin D3 1000 Unit Tabs (Cholecalciferol) .... 2 once daily po 2)  Fish Oil Oil (Fish oil) .Marland Kitchen.. 1 by mouth bid  Patient Instructions: 1)  Please schedule a follow-up appointment in 4 months well w/labs and Vit B12 782.0 and iron/TIBC 280.9   Orders Added: 1)  Est. Patient Level III [16109]

## 2010-07-14 ENCOUNTER — Ambulatory Visit (INDEPENDENT_AMBULATORY_CARE_PROVIDER_SITE_OTHER): Payer: Medicare Other | Admitting: Endocrinology

## 2010-07-14 ENCOUNTER — Encounter: Payer: Self-pay | Admitting: Endocrinology

## 2010-07-14 DIAGNOSIS — R079 Chest pain, unspecified: Secondary | ICD-10-CM

## 2010-07-14 DIAGNOSIS — J40 Bronchitis, not specified as acute or chronic: Secondary | ICD-10-CM

## 2010-07-18 ENCOUNTER — Encounter: Payer: Self-pay | Admitting: Internal Medicine

## 2010-07-18 NOTE — Assessment & Plan Note (Signed)
Summary: STILL HAS BRONCHITIS / NWS   Vital Signs:  Patient profile:   75 year old female Menstrual status:  hysterectomy Height:      62 inches (157.48 cm) Weight:      111 pounds (50.45 kg) BMI:     20.38 O2 Sat:      94 % on Room air Temp:     99.2 degrees F (37.33 degrees C) oral Pulse rate:   84 / minute BP sitting:   102 / 62  (left arm) Cuff size:   regular  Vitals Entered By: Brenton Grills CMA Duncan Dull) (July 14, 2010 3:31 PM)  O2 Flow:  Room air CC: PT c/o reoccuring cough, pain on left side (ribs) from coughing/aj   Primary Provider:  Tresa Garter MD  CC:  PT c/o reoccuring cough and pain on left side (ribs) from coughing/aj.  History of Present Illness: pt states 6 weeks of moderate pain at the left lateral chest, in the context of a dry cough, deep breathing, or turning over in bed.  she has slight wheezing at night.    Current Medications (verified): 1)  Vitamin D3 1000 Unit  Tabs (Cholecalciferol) .... 2 Once Daily Po 2)  Fish Oil   Oil (Fish Oil) .Marland Kitchen.. 1 By Mouth Bid  Allergies (verified): 1)  ! * Lovastatin 2)  Fluoxetine Hcl (Fluoxetine Hcl)  Past History:  Past Medical History: Last updated: 06/15/2010 Osteopenia Anxiety Pneumonia, hx of 12/09  Colon ? year  GYN Dr Ruffin Pyo at [facility]  Review of Systems  The patient denies fever and dyspnea on exertion.    Physical Exam  General:  normal appearance.   Head:  head: no deformity eyes: no periorbital swelling, no proptosis external nose and ears are normal mouth: no lesion seen Ears:  TM's intact and clear with normal canals with grossly normal hearing.   Chest Wall:  tender at the left lateral aspect. Lungs:  Clear to auscultation bilaterally. Normal respiratory effort.    Impression & Recommendations:  Problem # 1:  BRONCHITIS (ICD-490) with perssitent chest-wall pain  Other Orders: Ketorolac-Toradol 15mg  (U2725) Admin of Therapeutic Inj  intramuscular or  subcutaneous (36644) Est. Patient Level III (03474)  Patient Instructions: 1)  you should take your "tussionex" syrup as needed. 2)  please call dr plotnikov next week if you are not getting better.  3)  toradol 30 mg im   Medication Administration  Injection # 1:    Medication: Ketorolac-Toradol 15mg     Diagnosis: RIB PAIN, LEFT SIDED (ICD-786.50)    Route: IM    Site: RUOQ gluteus    Exp Date: 05/01/2011    Lot #: [id number]    Mfr: Novaplus    Comments: administerd 30mg  (1ml) of Ketoralac    Patient tolerated injection without complications    Given by: Brenton Grills CMA Duncan Dull) (July 14, 2010 4:58 PM)  Orders Added: 1)  Ketorolac-Toradol 15mg  [J1885] 2)  Admin of Therapeutic Inj  intramuscular or subcutaneous [96372] 3)  Est. Patient Level III [75643]

## 2010-07-19 ENCOUNTER — Encounter: Payer: Self-pay | Admitting: Internal Medicine

## 2010-07-19 ENCOUNTER — Ambulatory Visit (INDEPENDENT_AMBULATORY_CARE_PROVIDER_SITE_OTHER): Payer: Medicare Other | Admitting: Internal Medicine

## 2010-07-19 VITALS — BP 112/70 | HR 77 | Temp 98.0°F | Ht 66.0 in | Wt 109.4 lb

## 2010-07-19 DIAGNOSIS — R05 Cough: Secondary | ICD-10-CM

## 2010-07-19 DIAGNOSIS — J45991 Cough variant asthma: Secondary | ICD-10-CM

## 2010-07-19 MED ORDER — LORATADINE 10 MG PO TABS: 10.0000 mg | ORAL_TABLET | Freq: Every day | ORAL | Status: DC

## 2010-07-19 MED ORDER — DM-GUAIFENESIN ER 60-1200 MG PO TB12: 1.0000 | ORAL_TABLET | Freq: Two times a day (BID) | ORAL | Status: AC

## 2010-07-19 MED ORDER — BENZONATATE 100 MG PO CAPS: 100.0000 mg | ORAL_CAPSULE | Freq: Three times a day (TID) | ORAL | Status: AC | PRN

## 2010-07-19 MED ORDER — PREDNISONE (PAK) 10 MG PO TABS: 10.0000 mg | ORAL_TABLET | ORAL | Status: AC

## 2010-07-19 NOTE — Patient Instructions (Signed)
Good to see you today. use tessalon pearls, mucinex, generic claritin and pred pak as discussed -Your prescription(s) have been submitted to your pharmacy. Please take as directed and contact our office if you believe you are having problem(s) with the medication(s). Use hard candy to bathe back of throat during day, no throat clearing limit voice use, no singing for next 2 weeks.

## 2010-07-19 NOTE — Progress Notes (Signed)
  Subjective:     Julie Montgomery is a 75 y.o. female here for evaluation of a cough. Onset of symptoms was 6 weeks ago. Symptoms have been gradually worsening since that time. The cough is nocturnal, nonproductive and paroxysmal and is aggravated by cold air and pollens. Associated symptoms include: chest pain. Patient does have a history of asthma. Patient does have a history of environmental allergens. Patient has not traveled recently. Patient does not have a history of smoking. Patient has had a previous chest x-ray. Patient has not had a PPD done.  The following portions of the patient's history were reviewed and updated as appropriate: current medications, past medical history, past social history and problem list.  Review of Systems Constitutional: negative for fevers Respiratory: negative for hemoptysis Neurological: negative for headaches and tremors    Objective:    Oxygen saturation 97% on room air BP 112/70  Pulse 77  Temp(Src) 98 F (36.7 C) (Oral)  Ht 5\' 6"  (1.676 m)  Wt 109 lb 6.4 oz (49.624 kg)  BMI 17.66 kg/m2 General appearance: alert, cooperative and no distress Eyes: negative Ears: normal TM's and external ear canals both ears Nose: Nares normal. Septum midline. Mucosa normal. No drainage or sinus tenderness. Throat: lips, mucosa, and tongue normal; teeth and gums normal Lungs: clear to auscultation bilaterally Heart: regular rate and rhythm, S1, S2 normal, no murmur, click, rub or gallop    Assessment:    Cough    Plan:    Explained lack of efficacy of antibiotics in viral disease. Antitussives per medication orders. Avoid exposure to tobacco smoke and fumes. Call if shortness of breath worsens, blood in sputum, change in character of cough, development of fever or chills, inability to maintain nutrition and hydration. Avoid exposure to tobacco smoke and fumes.

## 2010-07-27 ENCOUNTER — Ambulatory Visit: Payer: Medicare Other | Admitting: Internal Medicine

## 2010-09-12 NOTE — Assessment & Plan Note (Signed)
[facility]                                 ON-CALL NOTE   Julie, Montgomery                      MRN:          [id number]  DATE:01/29/2008                            DOB:          04-19-1936    PRIMARY CARE PHYSICIAN:  Corwin Levins, MD   SUBJECTIVE:  Seen by primary care doctor today and diagnosed with viral  infection or possibly the flu.  They discussed Tamiflu, but opted not to  use it.  She has had symptoms about 24 hours.  She called tonight saying  that she is having a bad headache and would like to have Tamiflu called  in for her.   ASSESSMENT AND PLAN:  Possible influenza.  I discussed with her the  benefits as well as the cost of Tamiflu.  After discussing this, she  feels that she does not want to use Tamiflu at this time.  I have  recommended ibuprofen 800 mg every 8 hours for headache.  She will call  back or call in the morning if she changes her mind about Tamiflu to  have this called in as long as it is within the first 48-72 hours.     Kerby Nora, MD  Electronically Signed    AB/MedQ  DD: 01/29/2008  DT: 01/30/2008  Job #: [id number]

## 2010-09-13 ENCOUNTER — Encounter: Payer: Self-pay | Admitting: Internal Medicine

## 2010-09-13 ENCOUNTER — Ambulatory Visit (INDEPENDENT_AMBULATORY_CARE_PROVIDER_SITE_OTHER): Payer: Medicare Other | Admitting: Internal Medicine

## 2010-09-13 DIAGNOSIS — T7840XA Allergy, unspecified, initial encounter: Secondary | ICD-10-CM

## 2010-09-13 DIAGNOSIS — R42 Dizziness and giddiness: Secondary | ICD-10-CM

## 2010-09-13 DIAGNOSIS — R059 Cough, unspecified: Secondary | ICD-10-CM

## 2010-09-13 DIAGNOSIS — J309 Allergic rhinitis, unspecified: Secondary | ICD-10-CM

## 2010-09-13 DIAGNOSIS — R05 Cough: Secondary | ICD-10-CM

## 2010-09-13 MED ORDER — MECLIZINE HCL 12.5 MG PO TABS: 12.5000 mg | ORAL_TABLET | Freq: Three times a day (TID) | ORAL | Status: DC | PRN

## 2010-09-13 MED ORDER — MOMETASONE FURO-FORMOTEROL FUM 200-5 MCG/ACT IN AERO: 1.0000 | INHALATION_SPRAY | Freq: Two times a day (BID) | RESPIRATORY_TRACT | Status: DC

## 2010-09-13 NOTE — Patient Instructions (Signed)
Benign Positional Vertigo symptoms on the right Start Meclizine. Start Brandt - Daroff exercise several times a day as dirrected.  

## 2010-09-13 NOTE — Progress Notes (Signed)
  Subjective:    Patient ID: Julie Montgomery, female    DOB: [birth year], 75 y.o.   MRN: [id number]  HPI  C/o cough since Feb C/o dizziness since Sat C/o fatigue  Review of Systems  Constitutional: Positive for activity change and fatigue. Negative for chills.  HENT: Negative for neck pain.   Eyes: Negative for pain.  Respiratory: Positive for cough and chest tightness.   Cardiovascular: Negative for chest pain, palpitations and leg swelling.  Gastrointestinal: Negative for abdominal distention.  Genitourinary: Negative for dysuria.  Musculoskeletal: Negative for back pain.  Neurological: Positive for dizziness. Negative for numbness.  Psychiatric/Behavioral: Negative for behavioral problems.       Objective:   Physical Exam  Constitutional: She appears well-developed and well-nourished. No distress.  HENT:  Head: Normocephalic.  Right Ear: External ear normal.  Left Ear: External ear normal.  Nose: Nose normal.  Mouth/Throat: Oropharynx is clear and moist.  Eyes: Conjunctivae are normal. Pupils are equal, round, and reactive to light. Right eye exhibits no discharge. Left eye exhibits no discharge.  Neck: Normal range of motion. Neck supple. No JVD present. No tracheal deviation present. No thyromegaly present.  Cardiovascular: Normal rate, regular rhythm and normal heart sounds.   Pulmonary/Chest: No stridor. No respiratory distress. She has no wheezes.  Abdominal: Soft. Bowel sounds are normal. She exhibits no distension and no mass. There is no tenderness. There is no rebound and no guarding.  Musculoskeletal: She exhibits no edema and no tenderness.  Lymphadenopathy:    She has no cervical adenopathy.  Neurological: She displays normal reflexes. No cranial nerve deficit. She exhibits normal muscle tone. Coordination normal.       H-P +++ on L  Skin: No rash noted. No erythema.  Psychiatric: She has a normal mood and affect. Her behavior is normal. Judgment and thought  content normal.          Assessment & Plan:  COUGH New. Poss asthma. Start MDI -- Dulera CXR was OK in Feb  Vertigo BPV Benign Positional Vertigo symptoms on the L. Start Meclizine. Start Francee Piccolo - Daroff exercise several times a day as dirrected.   ALLERGY Start Claritin    She was monitored in the office for a long time until sx's resolved

## 2010-09-13 NOTE — Assessment & Plan Note (Addendum)
BPV Benign Positional Vertigo symptoms on the L. Start Meclizine. Start Francee Piccolo - Daroff exercise several times a day as dirrected.

## 2010-09-13 NOTE — Assessment & Plan Note (Addendum)
New. Poss asthma. Start MDI -- Elwin Sleight CXR was OK in Feb

## 2010-09-13 NOTE — Assessment & Plan Note (Signed)
Start Claritin 

## 2010-09-15 NOTE — Assessment & Plan Note (Signed)
[facility]                           PRIMARY CARE OFFICE NOTE   MEEGAN, SHANAFELT                      MRN:          [id number]  DATE:08/14/2006                            DOB:          07/19/1935    PROBLEM:  Ear irrigation bilaterally.   INDICATIONS:  Bilateral wax impactions.   Risks and benefits were explained to the patient.  She agreed to  proceed.   Both ears were irrigated with lukewarm water.  A large amount of wax was  recovered.  Post procedure exam normal.  Complications: vertigo,  resolved promptly.  She did not eat since 7:00 a.m.  The symptoms  resolved after she had a snack.     Georgina Quint. Plotnikov, MD  Electronically Signed    AVP/MedQ  DD: 08/14/2006  DT: 08/14/2006  Job #: [id number]

## 2010-09-15 NOTE — Procedures (Signed)
[place]  Patient:    Julie Montgomery, Julie Montgomery Visit Number: [id number] MRN: [id number]          Service Type: Attending:  Verlin Grills, M.D. Dictated by:   Verlin Grills, M.D. Proc. Date: 04/17/01   CC:         Tyson Dense, M.D.   Procedure Report  REFERRING PHYSICIAN:  Tyson Dense, M.D., [facility].  PROCEDURE:  Screening colonoscopy.  PROCEDURE INDICATION:  Julie Montgomery (date of birth 1936-03-15) is a 75 year old female who is due for her first screening colonoscopy with polypectomy to prevent colon cancer.  I discussed with Julie Montgomery the complications associated with colonoscopy and polypectomy, including a 15 per thousand risk of bleeding and four per thousand risk of colon perforation requiring surgical repair.  Julie Montgomery has signed the operative permit.  ENDOSCOPIST:  Verlin Grills, M.D.  PREMEDICATION:  Demerol 50 mg, Versed 5 mg.  ENDOSCOPE:  Olympus pediatric colonoscope.  DESCRIPTION OF PROCEDURE:  After obtaining informed consent, Julie Montgomery was placed in the left lateral decubitus position.  I administered intravenous Demerol and intravenous Versed to achieve conscious sedation for the procedure.  The patients blood pressure, oxygen saturation, and cardiac rhythm were monitored throughout the procedure and documented in the medical record.  Anal inspection was normal.  Digital rectal exam was normal.  The Olympus pediatric video colonoscope was introduced into the rectum and advanced to the cecum.  Colonic preparation for the exam today was excellent.  Rectum normal.  Sigmoid colon and descending colon normal.  Splenic flexure normal.  Transverse colon normal.  Hepatic flexure normal.  Ascending colon normal.  Cecum and ileocecal valve normal.  ASSESSMENT:  Normal screening proctocolonoscopy to the cecum.  No endoscopic evidence for the presence of  colorectal neoplasia.Dictated by:   Verlin Grills, M.D. Attending:  Verlin Grills, M.D. DD:  04/17/01 TD:  04/18/01 Job: [id number]

## 2010-09-15 NOTE — Assessment & Plan Note (Signed)
[facility]                                 ON-CALL NOTE   BRADY, PLANT                        MRN:          [id number]  DATE:06/15/2006                            DOB:          [birth year]    PRIMARY CARE PHYSICIAN:  Dr. Posey Rea.   PHONE:  [phone].   SUBJECTIVE:  A 75 year old female with congestion, cough, and lost voice  for one week, no shortness of breath, no wheezing.   ASSESSMENT AND PLAN:  To Saturday clinic.  Nurse's appointment on  Monday.     Kerby Nora, MD  Electronically Signed    AB/MedQ  DD: 06/16/2006  DT: 06/16/2006  Job #: [id number]

## 2010-09-17 ENCOUNTER — Encounter: Payer: Self-pay | Admitting: Internal Medicine

## 2010-09-19 ENCOUNTER — Ambulatory Visit (INDEPENDENT_AMBULATORY_CARE_PROVIDER_SITE_OTHER): Payer: Medicare Other | Admitting: Psychology

## 2010-09-19 DIAGNOSIS — F4322 Adjustment disorder with anxiety: Secondary | ICD-10-CM

## 2010-09-28 ENCOUNTER — Ambulatory Visit: Payer: Medicare Other | Admitting: Internal Medicine

## 2010-10-12 ENCOUNTER — Ambulatory Visit (INDEPENDENT_AMBULATORY_CARE_PROVIDER_SITE_OTHER): Payer: Medicare Other | Admitting: Psychology

## 2010-10-12 DIAGNOSIS — F4322 Adjustment disorder with anxiety: Secondary | ICD-10-CM

## 2010-10-26 ENCOUNTER — Ambulatory Visit (INDEPENDENT_AMBULATORY_CARE_PROVIDER_SITE_OTHER): Payer: Medicare Other | Admitting: Psychology

## 2010-10-26 DIAGNOSIS — F4322 Adjustment disorder with anxiety: Secondary | ICD-10-CM

## 2010-10-30 ENCOUNTER — Other Ambulatory Visit (INDEPENDENT_AMBULATORY_CARE_PROVIDER_SITE_OTHER): Payer: Medicare Other

## 2010-10-30 ENCOUNTER — Other Ambulatory Visit: Payer: Self-pay | Admitting: Internal Medicine

## 2010-10-30 ENCOUNTER — Other Ambulatory Visit (INDEPENDENT_AMBULATORY_CARE_PROVIDER_SITE_OTHER): Payer: Medicare Other | Admitting: Internal Medicine

## 2010-10-30 DIAGNOSIS — R209 Unspecified disturbances of skin sensation: Secondary | ICD-10-CM

## 2010-10-30 DIAGNOSIS — D509 Iron deficiency anemia, unspecified: Secondary | ICD-10-CM

## 2010-10-30 DIAGNOSIS — E785 Hyperlipidemia, unspecified: Secondary | ICD-10-CM

## 2010-10-30 DIAGNOSIS — Z0000 Encounter for general adult medical examination without abnormal findings: Secondary | ICD-10-CM

## 2010-10-30 LAB — URINALYSIS, ROUTINE W REFLEX MICROSCOPIC
Hgb urine dipstick: NEGATIVE
Specific Gravity, Urine: 1.02 (ref 1.000–1.030)
Urine Glucose: NEGATIVE
Urobilinogen, UA: 0.2 (ref 0.0–1.0)

## 2010-10-30 LAB — CBC WITH DIFFERENTIAL/PLATELET
Basophils Absolute: 0.1 10*3/uL (ref 0.0–0.1)
Eosinophils Absolute: 0.3 10*3/uL (ref 0.0–0.7)
Hemoglobin: 11.4 g/dL — ABNORMAL LOW (ref 12.0–15.0)
Lymphocytes Relative: 21.2 % (ref 12.0–46.0)
MCHC: 33.7 g/dL (ref 30.0–36.0)
Monocytes Relative: 6.8 % (ref 3.0–12.0)
Neutro Abs: 5 10*3/uL (ref 1.4–7.7)
Neutrophils Relative %: 67.8 % (ref 43.0–77.0)
Platelets: 383 10*3/uL (ref 150.0–400.0)
RDW: 14.4 % (ref 11.5–14.6)

## 2010-10-30 LAB — HEPATIC FUNCTION PANEL
AST: 16 U/L (ref 0–37)
Albumin: 3.2 g/dL — ABNORMAL LOW (ref 3.5–5.2)
Alkaline Phosphatase: 85 U/L (ref 39–117)
Bilirubin, Direct: 0.1 mg/dL (ref 0.0–0.3)
Total Bilirubin: 0.4 mg/dL (ref 0.3–1.2)

## 2010-10-30 LAB — BASIC METABOLIC PANEL
BUN: 16 mg/dL (ref 6–23)
CO2: 30 mEq/L (ref 19–32)
Calcium: 9.1 mg/dL (ref 8.4–10.5)
Chloride: 106 mEq/L (ref 96–112)
Creatinine, Ser: 0.7 mg/dL (ref 0.4–1.2)
Glucose, Bld: 95 mg/dL (ref 70–99)

## 2010-10-30 LAB — LIPID PANEL
LDL Cholesterol: 97 mg/dL (ref 0–99)
Total CHOL/HDL Ratio: 4
Triglycerides: 63 mg/dL (ref 0.0–149.0)

## 2010-10-30 LAB — VITAMIN B12: Vitamin B-12: 627 pg/mL (ref 211–911)

## 2010-11-07 ENCOUNTER — Encounter: Payer: Self-pay | Admitting: Internal Medicine

## 2010-11-07 ENCOUNTER — Ambulatory Visit (INDEPENDENT_AMBULATORY_CARE_PROVIDER_SITE_OTHER): Payer: Medicare Other | Admitting: Internal Medicine

## 2010-11-07 DIAGNOSIS — F329 Major depressive disorder, single episode, unspecified: Secondary | ICD-10-CM

## 2010-11-07 DIAGNOSIS — R634 Abnormal weight loss: Secondary | ICD-10-CM

## 2010-11-07 DIAGNOSIS — R05 Cough: Secondary | ICD-10-CM

## 2010-11-07 MED ORDER — TRAMADOL HCL 50 MG PO TABS: 50.0000 mg | ORAL_TABLET | Freq: Two times a day (BID) | ORAL | Status: AC | PRN

## 2010-11-07 MED ORDER — PREDNISONE 10 MG PO TABS: ORAL_TABLET | ORAL | Status: AC

## 2010-11-07 NOTE — Assessment & Plan Note (Signed)
Dulera did not help Pulm Consult Tramadol bid

## 2010-11-07 NOTE — Progress Notes (Signed)
  Subjective:    Patient ID: Julie Montgomery, female    DOB: [birth year], 75 y.o.   MRN: [id number]  HPI  F/u cough since Feb. Dulera did not help. Overall better, still coughing, however.... Dry cough.No fever.  Review of Systems  Constitutional: Positive for unexpected weight change (wt loss). Negative for chills, activity change, appetite change and fatigue.  HENT: Negative for congestion, mouth sores and sinus pressure.   Eyes: Negative for visual disturbance.  Respiratory: Positive for cough, choking, chest tightness and wheezing.   Gastrointestinal: Negative for nausea and abdominal pain.  Genitourinary: Negative for frequency, difficulty urinating and vaginal pain.  Musculoskeletal: Negative for back pain and gait problem.  Skin: Negative for pallor and rash.  Neurological: Negative for dizziness, tremors, weakness, numbness and headaches.  Psychiatric/Behavioral: Negative for confusion and sleep disturbance. The patient is not nervous/anxious.    Wt Readings from Last 3 Encounters:  11/07/10 105 lb (47.628 kg)  09/13/10 110 lb (49.896 kg)  07/19/10 109 lb 6.4 oz (49.624 kg)       Objective:   Physical Exam  Constitutional: She appears well-developed and well-nourished. No distress.       Coughing periodically  HENT:  Head: Normocephalic.  Right Ear: External ear normal.  Left Ear: External ear normal.  Nose: Nose normal.  Mouth/Throat: Oropharynx is clear and moist.  Eyes: Conjunctivae are normal. Pupils are equal, round, and reactive to light. Right eye exhibits no discharge. Left eye exhibits no discharge.  Neck: Normal range of motion. Neck supple. No JVD present. No tracheal deviation present. No thyromegaly present.  Cardiovascular: Normal rate, regular rhythm and normal heart sounds.   Pulmonary/Chest: No stridor. No respiratory distress. She has no wheezes.  Abdominal: Soft. Bowel sounds are normal. She exhibits no distension and no mass. There is no tenderness.  There is no rebound and no guarding.  Musculoskeletal: She exhibits no edema and no tenderness.  Lymphadenopathy:    She has no cervical adenopathy.  Neurological: She displays normal reflexes. No cranial nerve deficit. She exhibits normal muscle tone. Coordination normal.  Skin: No rash noted. She is not diaphoretic. No erythema.  Psychiatric: She has a normal mood and affect. Her behavior is normal. Judgment and thought content normal.   CXR w/bronchitis     Lab Results  Component Value Date   WBC 7.4 10/30/2010   HGB 11.4* 10/30/2010   HCT 34.0* 10/30/2010   PLT 383.0 10/30/2010   CHOL 150 10/30/2010   TRIG 63.0 10/30/2010   HDL 40.80 10/30/2010   LDLDIRECT 129.5 02/06/2010   ALT 15 10/30/2010   AST 16 10/30/2010   NA 140 10/30/2010   K 4.4 10/30/2010   CL 106 10/30/2010   CREATININE 0.7 10/30/2010   BUN 16 10/30/2010   CO2 30 10/30/2010   TSH 4.23 10/30/2010     Assessment & Plan:

## 2010-11-07 NOTE — Patient Instructions (Signed)
Use Tramadol for cough: start with 1/2 tab twice a day

## 2010-11-07 NOTE — Assessment & Plan Note (Signed)
Worse now due to illness Labs OK

## 2010-11-23 ENCOUNTER — Ambulatory Visit (INDEPENDENT_AMBULATORY_CARE_PROVIDER_SITE_OTHER): Payer: Medicare Other | Admitting: Psychology

## 2010-11-23 DIAGNOSIS — F4322 Adjustment disorder with anxiety: Secondary | ICD-10-CM

## 2010-12-08 ENCOUNTER — Encounter: Payer: Self-pay | Admitting: Pulmonary Disease

## 2010-12-08 ENCOUNTER — Ambulatory Visit (INDEPENDENT_AMBULATORY_CARE_PROVIDER_SITE_OTHER): Payer: Medicare Other | Admitting: Pulmonary Disease

## 2010-12-08 VITALS — BP 110/70 | HR 74 | Temp 98.2°F | Ht 62.5 in | Wt 107.8 lb

## 2010-12-08 DIAGNOSIS — R05 Cough: Secondary | ICD-10-CM

## 2010-12-08 NOTE — Assessment & Plan Note (Addendum)
Obtian High res CT to FU on mild bronchiectasis noted on earlier CT in 2007. This would be a good explanation for chronic cough Otherwise treat ussual suspects - post nasal disease, GERD , doubt asthma here. Empiric trial of Qvar & claritin D

## 2010-12-08 NOTE — Progress Notes (Signed)
  Subjective:    Patient ID: Julie Montgomery, female    DOB: 1936/03/11, 75 y.o.   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 Gets hoarse, barking cough, no diurnal variation, does not wake her up CXRs last one in feb '12 have shown interstitial prominence CT chest '07 showed mild bronchectasis  Post nasal drip +, sneezes a lot, takes allergy meds seasonal, denies heartburn  Review of Systems  Constitutional: Positive for appetite change and unexpected weight change. Negative for fever.  HENT: Positive for congestion and sneezing. Negative for ear pain, nosebleeds, sore throat, rhinorrhea, trouble swallowing, dental problem, postnasal drip and sinus pressure.   Eyes: Negative for redness and itching.  Respiratory: Positive for cough and shortness of breath. Negative for chest tightness and wheezing.   Cardiovascular: Positive for chest pain. Negative for palpitations and leg swelling.  Gastrointestinal: Negative for nausea and vomiting.  Genitourinary: Negative for dysuria.  Musculoskeletal: Negative for joint swelling.  Skin: Negative for rash.  Neurological: Negative for headaches.  Hematological: Does not bruise/bleed easily.  Psychiatric/Behavioral: Negative for dysphoric mood. The patient is not nervous/anxious.        Objective:   Physical Exam Gen. Pleasant, thin woman, in no distress, normal affect ENT - no lesions, no post nasal drip Neck: No JVD, no thyromegaly, no carotid bruits Lungs: no use of accessory muscles, no dullness to percussion, clear without rales or rhonchi  Cardiovascular: Rhythm regular, heart sounds  normal, no murmurs or gallops, no peripheral edema Abdomen: soft and non-tender, no hepatosplenomegaly, BS normal. Musculoskeletal: No  deformities, no cyanosis or clubbing Neuro:  alert, non focal        Assessment & Plan:

## 2010-12-08 NOTE — Patient Instructions (Addendum)
High resolution CT scan of the chest Take DELSYM cough syrup 2 tsp  upto thrice daily as needed Sample of Qvar 80 2 puffs daily- RINSE mouth after Take CLARITIN- D daily

## 2010-12-15 ENCOUNTER — Ambulatory Visit (INDEPENDENT_AMBULATORY_CARE_PROVIDER_SITE_OTHER)
Admission: RE | Admit: 2010-12-15 | Discharge: 2010-12-15 | Disposition: A | Discharge status: Home / Self Care | Payer: Medicare Other | Source: Ambulatory Visit | Attending: Pulmonary Disease | Admitting: Pulmonary Disease

## 2010-12-15 DIAGNOSIS — R05 Cough: Secondary | ICD-10-CM

## 2010-12-21 ENCOUNTER — Ambulatory Visit: Payer: Medicare Other | Admitting: Psychology

## 2011-01-04 ENCOUNTER — Ambulatory Visit
Admission: RE | Admit: 2011-01-04 | Discharge: 2011-01-04 | Disposition: A | Discharge status: Home / Self Care | Payer: Medicare Other | Source: Ambulatory Visit | Attending: Sports Medicine | Admitting: Sports Medicine

## 2011-01-04 ENCOUNTER — Other Ambulatory Visit: Payer: Self-pay | Admitting: Sports Medicine

## 2011-01-04 DIAGNOSIS — M25511 Pain in right shoulder: Secondary | ICD-10-CM

## 2011-01-22 ENCOUNTER — Encounter: Payer: Self-pay | Admitting: Pulmonary Disease

## 2011-01-22 ENCOUNTER — Ambulatory Visit (INDEPENDENT_AMBULATORY_CARE_PROVIDER_SITE_OTHER): Payer: Medicare Other | Admitting: Pulmonary Disease

## 2011-01-22 DIAGNOSIS — J309 Allergic rhinitis, unspecified: Secondary | ICD-10-CM

## 2011-01-22 DIAGNOSIS — J479 Bronchiectasis, uncomplicated: Secondary | ICD-10-CM

## 2011-01-22 DIAGNOSIS — Z23 Encounter for immunization: Secondary | ICD-10-CM

## 2011-01-22 DIAGNOSIS — R05 Cough: Secondary | ICD-10-CM

## 2011-01-22 MED ORDER — CLARITHROMYCIN 500 MG PO TABS: 500.0000 mg | ORAL_TABLET | Freq: Two times a day (BID) | ORAL | Status: DC

## 2011-01-22 NOTE — Patient Instructions (Signed)
Antibiotic x 10 days Allergy testing Nasal spray each nare at bedtime Stay on claritin D

## 2011-01-22 NOTE — Progress Notes (Signed)
  Subjective:    Patient ID: Julie Montgomery, female    DOB: August 13, 1935, 75 y.o.   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  Gets hoarse, barking cough, no diurnal variation, does not wake her up  CXRs last one in feb '12 have shown interstitial prominence  CT chest '07 showed mild bronchectasis  Post nasal drip +, sneezes a lot, takes allergy meds seasonal, denies heartburn  HRCT 8/12 showed reticular nodular opacities  in the lingula and progressive in the right lower lobe.   01/22/2011 Patient states she is still having a productive cough at night an in the AM with  Green Phlem..denies any SOB or fever.No relief with empiric trial of Qvar & claritin D Wonders if allergies- took shots from dr [place] years ago Puppy outside the house Lost 6 lbs since may  Review of Systems  Constitutional: Positive for unexpected weight change. Negative for fever.  HENT: Positive for rhinorrhea, sneezing and dental problem.   Respiratory: Positive for cough.   Psychiatric/Behavioral: Positive for dysphoric mood. The patient is nervous/anxious.        Objective:   Physical Exam  Gen. Pleasant, thin, in no distress ENT - no lesions, no post nasal drip Neck: No JVD, no thyromegaly, no carotid bruits Lungs: no use of accessory muscles, no dullness to percussion, clear without rales or rhonchi  Cardiovascular: Rhythm regular, heart sounds  normal, no murmurs or gallops, no peripheral edema Musculoskeletal: No deformities, no cyanosis or clubbing        Assessment & Plan:

## 2011-01-23 DIAGNOSIS — J479 Bronchiectasis, uncomplicated: Secondary | ICD-10-CM | POA: Insufficient documentation

## 2011-01-23 NOTE — Assessment & Plan Note (Signed)
If infiltrates worsen, will need further WU Observe for now & re- image in  [facility]

## 2011-01-23 NOTE — Assessment & Plan Note (Signed)
Antibiotic x 10 days Allergy testing Nasal spray each nare at bedtime Stay on claritin D 

## 2011-01-26 ENCOUNTER — Other Ambulatory Visit: Payer: Medicare Other

## 2011-01-26 DIAGNOSIS — R059 Cough, unspecified: Secondary | ICD-10-CM

## 2011-01-26 DIAGNOSIS — R05 Cough: Secondary | ICD-10-CM

## 2011-01-29 ENCOUNTER — Telehealth: Payer: Self-pay | Admitting: *Deleted

## 2011-01-29 NOTE — Telephone Encounter (Signed)
Pt was recently seen by Dr Macario Golds and pulmonary. She c/o continued weight loss. Pt is req advisement from Dr [employer]. She would like appetite stimulant or other advisement please.

## 2011-01-30 ENCOUNTER — Ambulatory Visit (INDEPENDENT_AMBULATORY_CARE_PROVIDER_SITE_OTHER): Payer: Medicare Other | Admitting: Internal Medicine

## 2011-01-30 ENCOUNTER — Encounter: Payer: Self-pay | Admitting: Internal Medicine

## 2011-01-30 DIAGNOSIS — M949 Disorder of cartilage, unspecified: Secondary | ICD-10-CM

## 2011-01-30 DIAGNOSIS — R05 Cough: Secondary | ICD-10-CM

## 2011-01-30 DIAGNOSIS — M25519 Pain in unspecified shoulder: Secondary | ICD-10-CM

## 2011-01-30 DIAGNOSIS — R634 Abnormal weight loss: Secondary | ICD-10-CM

## 2011-01-30 DIAGNOSIS — F3289 Other specified depressive episodes: Secondary | ICD-10-CM

## 2011-01-30 DIAGNOSIS — F329 Major depressive disorder, single episode, unspecified: Secondary | ICD-10-CM

## 2011-01-30 DIAGNOSIS — M899 Disorder of bone, unspecified: Secondary | ICD-10-CM

## 2011-01-30 DIAGNOSIS — M25511 Pain in right shoulder: Secondary | ICD-10-CM | POA: Insufficient documentation

## 2011-01-30 DIAGNOSIS — R059 Cough, unspecified: Secondary | ICD-10-CM

## 2011-01-30 LAB — ALLERGY FULL PROFILE
Allergen, D pternoyssinus,d7: <0.1 kU/L (ref <0.35)
Allergen,Goose feathers, e70: <0.1 kU/L (ref <0.35)
Bahia Grass: <0.1 kU/L (ref <0.35)
Box Elder IgE: <0.1 kU/L (ref <0.35)
Common Ragweed: 0.1 kU/L (ref <0.35)
Curvularia lunata: <0.1 kU/L (ref <0.35)
Dog Dander: <0.1 kU/L (ref <0.35)
Fescue: <0.1 kU/L (ref <0.35)
G005 Rye, Perennial: <0.1 kU/L (ref <0.35)
Goldenrod: <0.1 kU/L (ref <0.35)
Helminthosporium halodes: <0.1 kU/L (ref <0.35)
House Dust Hollister: <0.1 kU/L (ref <0.35)
IgE (Immunoglobulin E), Serum: 11.1 IU/mL (ref 0.0–180.0)
Oak: <0.1 kU/L (ref <0.35)
Stemphylium Botryosum: <0.1 kU/L (ref <0.35)
Timothy Grass: <0.1 kU/L (ref <0.35)

## 2011-01-30 MED ORDER — MIRTAZAPINE 15 MG PO TBDP: 15.0000 mg | ORAL_TABLET | Freq: Every day | ORAL | Status: DC

## 2011-01-30 NOTE — Assessment & Plan Note (Signed)
Vit D Yoga 

## 2011-01-30 NOTE — Assessment & Plan Note (Signed)
Better Finish Biaxin ROV w/Dr Vassie Loll

## 2011-01-30 NOTE — Assessment & Plan Note (Signed)
Diet discussed 

## 2011-01-30 NOTE — Patient Instructions (Addendum)
Wt Readings from Last 3 Encounters:  01/30/11 107 lb (48.535 kg)  01/22/11 107 lb 3.2 oz (48.626 kg)  12/08/10 107 lb 12.8 oz (48.898 kg)   5/12   110 lbs  Finish Biaxin as dirrected

## 2011-01-30 NOTE — Telephone Encounter (Signed)
Left detailed VM on pt's hm # 

## 2011-01-30 NOTE — Progress Notes (Signed)
  Subjective:    Patient ID: Julie Montgomery, female    DOB: 06-Sep-1935, 75 y.o.   MRN: [id number]  HPI F/u cough, wt loss C/o L shoulder pain  Review of Systems  Constitutional: Negative for chills, activity change, appetite change, fatigue and unexpected weight change.  HENT: Negative for congestion, mouth sores and sinus pressure.   Eyes: Negative for visual disturbance.  Respiratory: Negative for cough and chest tightness.   Gastrointestinal: Negative for nausea, abdominal pain, constipation, blood in stool and abdominal distention.  Genitourinary: Negative for frequency, difficulty urinating and vaginal pain.  Musculoskeletal: Positive for arthralgias (L shoulder). Negative for back pain and gait problem.  Skin: Negative for pallor and rash.  Neurological: Negative for dizziness, tremors, weakness, numbness and headaches.  Psychiatric/Behavioral: Negative for suicidal ideas, confusion, sleep disturbance and dysphoric mood. The patient is not nervous/anxious.        Objective:   Physical Exam  Constitutional: She appears well-developed and well-nourished. No distress.  HENT:  Head: Normocephalic.  Right Ear: External ear normal.  Left Ear: External ear normal.  Nose: Nose normal.  Mouth/Throat: Oropharynx is clear and moist.  Eyes: Conjunctivae are normal. Pupils are equal, round, and reactive to light. Right eye exhibits no discharge. Left eye exhibits no discharge.  Neck: Normal range of motion. Neck supple. No JVD present. No tracheal deviation present. No thyromegaly present.  Cardiovascular: Normal rate, regular rhythm and normal heart sounds.   Pulmonary/Chest: No stridor. No respiratory distress. She has no wheezes.  Abdominal: Soft. Bowel sounds are normal. She exhibits no distension and no mass. There is no tenderness. There is no rebound and no guarding.  Musculoskeletal: She exhibits no edema and no tenderness.  Lymphadenopathy:    She has no cervical adenopathy.    Neurological: She displays normal reflexes. No cranial nerve deficit. She exhibits normal muscle tone. Coordination normal.  Skin: No rash noted. No erythema.  Psychiatric: She has a normal mood and affect. Her behavior is normal. Judgment and thought content normal.          Assessment & Plan:

## 2011-01-30 NOTE — Assessment & Plan Note (Signed)
Better now 

## 2011-01-30 NOTE — Assessment & Plan Note (Signed)
Dr Teressa Senter will operate in 1 wk

## 2011-01-30 NOTE — Telephone Encounter (Signed)
Try Remeron at 5-6 pm ROV 2-4 wks Thx

## 2011-01-31 ENCOUNTER — Telehealth: Payer: Self-pay | Admitting: Pulmonary Disease

## 2011-01-31 NOTE — Telephone Encounter (Signed)
I informed pt of RA's findings and recommendations of RAST (allergy profile). Pt verbalized understanding.

## 2011-01-31 NOTE — Progress Notes (Signed)
Quick Note:  I informed pt of RA's findings and recommendations. Pt verbalized understanding  ______ 

## 2011-02-06 ENCOUNTER — Ambulatory Visit (HOSPITAL_BASED_OUTPATIENT_CLINIC_OR_DEPARTMENT_OTHER)
Admission: RE | Admit: 2011-02-06 | Discharge: 2011-02-07 | Disposition: A | Discharge status: Home / Self Care | Payer: Medicare Other | Source: Ambulatory Visit | Attending: Orthopedic Surgery | Admitting: Orthopedic Surgery

## 2011-02-06 DIAGNOSIS — Z01812 Encounter for preprocedural laboratory examination: Secondary | ICD-10-CM | POA: Insufficient documentation

## 2011-02-06 DIAGNOSIS — M719 Bursopathy, unspecified: Secondary | ICD-10-CM | POA: Insufficient documentation

## 2011-02-06 DIAGNOSIS — M19019 Primary osteoarthritis, unspecified shoulder: Secondary | ICD-10-CM | POA: Insufficient documentation

## 2011-02-06 DIAGNOSIS — M67919 Unspecified disorder of synovium and tendon, unspecified shoulder: Secondary | ICD-10-CM | POA: Insufficient documentation

## 2011-02-06 DIAGNOSIS — Z01810 Encounter for preprocedural cardiovascular examination: Secondary | ICD-10-CM | POA: Insufficient documentation

## 2011-02-06 DIAGNOSIS — M7500 Adhesive capsulitis of unspecified shoulder: Secondary | ICD-10-CM | POA: Insufficient documentation

## 2011-02-06 DIAGNOSIS — Z5333 Arthroscopic surgical procedure converted to open procedure: Secondary | ICD-10-CM | POA: Insufficient documentation

## 2011-02-09 NOTE — Op Note (Signed)
Julie Montgomery, Julie Montgomery               ACCOUNT NO.:  [id number]  MEDICAL RECORD NO.:  [id number]  LOCATION:                                 FACILITY:  PHYSICIAN:  Katy Fitch. Glenola Wheat, M.D.      DATE OF BIRTH:  DATE OF PROCEDURE:  02/06/2011 DATE OF DISCHARGE:                              OPERATIVE REPORT   PREOPERATIVE DIAGNOSIS:  MRI documented large retracted right rotator cuff tear involving supraspinatus, infraspinatus tendons with moderate degenerative arthritis of glenohumeral joint and clinical examination suggestive of possible adhesive capsulitis with unfavorable acromioclavicular anatomy and anterior type 3 acromial spur.  POSTOPERATIVE DIAGNOSIS:  Very degenerative shredded T-type tear involving entire supraspinatus and infraspinatus tendons, retracted more than 2.5 cm, also 4+ adhesive capsulitis with marked stiffness and acromioclavicular degenerative arthritis with documentation of very prominent anterior acromial osteophyte at insertion of coracoacromial ligament.  OPERATION: 1. Examination of right shoulder under anesthesia documenting adhesive     capsulitis. 2. Diagnostic arthroscopy, right glenohumeral joint, confirming     adhesive capsulitis and mild degenerative arthritis of glenohumeral     joint. 3. Arthroscopic capsulectomy and synovectomy. 4. Arthroscopic subacromial decompression with bursectomy,     coracoacromial ligament release and acromioplasty. 5. Arthroscopic distal clavicle resection. 6. Open hybrid reconstruction of degenerative T-type tear of     supraspinatus, infraspinatus tendons.  OPERATING SURGEON:  Katy Fitch. Khani Paino, MD  ASSISTANT:  Marveen Reeks. Dasnoit, PA-C.  ANESTHESIA:  General endotracheal supplemented by a right plexus block.  SUPERVISING ANESTHESIOLOGIST:  Achille Rich, MD  INDICATIONS:  Julie Montgomery is a 75 year old woman whose primary care physician is Dr. Jacinta Shoe.  She was referred through friends for evaluation  and management of a chronically painful right shoulder. She had had a prior MRI ordered by Dr. Margaretha Sheffield of [facility] documenting a significant retracted rotator cuff tear.  On clinical examination, she had a stiff shoulder, weak abduction, night pain and signs of a significant rotator cuff impairment and adhesive capsulitis.  Plain x-rays documented an old right clavicle fracture that was healed, AC arthropathy, squaring of the humeral head consistent with glenohumeral arthropathy and the MRI was reviewed confirming a very substantial rotator cuff tear.  We recommended examination of the shoulder under anesthesia, diagnostic arthroscopy, subacromial decompression, acromioplasty, distal clavicle resection and reconstruction of the rotator cuff.  The arthroscopic procedure was also designed to thoroughly assess her degree of osteoarthrosis and determine whether or not she might be a candidate at some point in the future for arthroplasty.  Questions regarding Her predicament were invited and answered in detail.  PROCEDURE:  Julie Montgomery was brought to room 6 of the [facility] and placed in supine position upon the operating table. Dr. Chaney Malling of Anesthesia had provided detailed informed consent in the holding area preoperatively.  After questions were invited and answered in detail by Dr. Chaney Malling, a right plexus block was placed for perioperative comfort.  In room 6, under Dr. Seward Meth direct supervision, general endotracheal anesthesia was induced followed by careful positioning in the beach- chair position with the aid of a torso and head holder designed for shoulder arthroscopy.  Passive calf pumps were applied  as well as warming blanket.  The right arm was examined carefully under anesthesia.  Combined elevation was noted to be 160 degrees, external rotation limited at 70 degrees at 90 degrees abduction, extension limited to 30 degrees at  the head of the interscapular plane and internal rotation of 40 degrees. This was a very significant adhesive capsulitis.  With any internal- external rotation of the shoulder, there was loud crepitation beneath the acromion at the site of the tear.  The right upper extremity and forequarter were prepped with DuraPrep and draped with impervious arthroscopy drapes.  After routine surgical time- out and confirmation of 1 g of Ancef being administered as an IV prophylactic antibiotic, the scope was introduced through a standard posterior viewing portal with anterior to posterior switching stick technique.  Diagnostic arthroscopy confirmed a significant degree of adhesive capsulitis, granulation tissue, and scarring around the subscapularis tendon and anterior glenohumeral ligaments.  An anterior portal was created under direct vision followed by use of a 4.2-mm suction shaver to thoroughly debride granulation tissue and to tenolyse the subscapularis tendon and anterior glenohumeral ligaments. Hemostasis was achieved with a bipolar radiofrequency probe.  The large rotator cuff tear was identified and documented with a digital camera.  The suction shaver was used to debride the stump remnants of rotator cuff of the greater tuberosity and a thorough tenolysis of the biceps and subscapularis was accomplished with the scope and the anterolateral portal.  The greater tuberosity was partially debrided with the suction shaver while visualizing through the joint followed by removal of the arthroscope from the glenohumeral joint and placement of the scope in the subacromial space.  After a thorough bursectomy, the anatomy of this coracoacromial ligament and subacromial space was studied.  There was a large anterior osteophyte.  The coracoacromial ligament was released with cutting cautery followed by leveling the acromion to type 1 morphology with a suction shaver.  The distal centimeter of  clavicle was removed arthroscopically and hemostasis was achieved.  Throughout the procedure, we noted pesky bleeding which suggested that either Ms. Kinnaird was using a nonsteroidal medication regularly or she may have had some type of qualitative platelet defect.  Reviewing her medical notes was not revealing as to the source of our pesky bleeding.  After completion of subacromial debridement and hemostasis, we then proceeded to perform a 2-cm anterior middle third deltoid splitting incision due to the complex nature of the degenerative tear.  The tear was sharply debrided under direct vision.  A FiberTape was placed with baseball stitch technique to close the T with margin convergence followed by placement of two 5.5-mm bio-composite corkscrews with placement of paired mattress sutures at the medial footprint of the supraspinatus-infraspinatus followed by lateralization of the tendon and inset with a pair of 4.75-mm SwiveLocks with the FiberTape in the anterior SwiveLock with 1 set of the anterior anchor mattress suture tails and 3 pair of tails in the posterior SwiveLock.  Anatomic repair of the rotator cuff was achieved with a very low profile footprint.  Dog ears were trimmed with a rongeur.  A large flap of tendon that is delaminated posteriorly was then oversewn to the infraspinatus, essentially smoothing the repair site with a flap of bursa that extended to the teres minor.  A very satisfactory repair was achieved.  The deltoid split was then repaired with mattress suture of 0 Vicryl followed by repair of the skin with subcutaneous 2-0 Vicryl and intradermal 3-0 Prolene.  Ms. Noland tolerated the surgery and  anesthesia well.  Once again, we were impressed with the degree of oozing from all bone surfaces during the procedure suggesting that she may have a qualitative platelet defect, despite adequate numbers on lab inspection.  There are otherwise no apparent  complications.     Katy Fitch Dajon Lazar, M.D.     RVS/MEDQ  D:  02/06/2011  T:  02/07/2011  Job:  [id number]  cc:   Georgina Quint. Plotnikov, MD  Electronically Signed by Josephine Igo M.D. on 02/09/2011 08:27:04 AM

## 2011-02-12 ENCOUNTER — Telehealth: Payer: Self-pay | Admitting: *Deleted

## 2011-02-12 NOTE — Telephone Encounter (Signed)
Pt c/o swelling in her ankles and feet. She had shoulder surgery last week, called surgeon who advised she call PCP. Pt spoke w/oncall service who contacted MD on call. Patient was advised to go to ER by ON CALL MD. Patient did NOT go to the ER, she called this am and left VM. I attempted x 10 to contact patient but phone was busy. Latoya (scheduler) helped me get patient on the phone. I spoke w/pt - she has no SOB, chest pain or any other complaints. I advised her to call office or go to the ER with any severe symptoms, pt agreed and scheduled an apt tomorrow for eval.

## 2011-02-13 ENCOUNTER — Encounter: Payer: Self-pay | Admitting: Endocrinology

## 2011-02-13 ENCOUNTER — Ambulatory Visit (INDEPENDENT_AMBULATORY_CARE_PROVIDER_SITE_OTHER): Payer: Medicare Other | Admitting: Endocrinology

## 2011-02-13 ENCOUNTER — Encounter (INDEPENDENT_AMBULATORY_CARE_PROVIDER_SITE_OTHER): Payer: Medicare Other | Admitting: *Deleted

## 2011-02-13 ENCOUNTER — Other Ambulatory Visit: Payer: Self-pay | Admitting: Endocrinology

## 2011-02-13 VITALS — BP 132/68 | HR 79 | Temp 98.2°F | Ht 62.5 in | Wt 111.5 lb

## 2011-02-13 DIAGNOSIS — R609 Edema, unspecified: Secondary | ICD-10-CM

## 2011-02-13 NOTE — Patient Instructions (Addendum)
Let's check an ultrasound of the legs, to check for blood clots.   I hope you feel better soon.  If you don't feel better by next week, please call your doctor.

## 2011-02-13 NOTE — Progress Notes (Signed)
  Subjective:    Patient ID: Julie Montgomery, female    DOB: 1935/09/11, 75 y.o.   MRN: [id number]  HPI Pt had right shoulder surgery 1 week ago.  She now has a few days of slight swelling of the legs, but no assoc sob.  She says she has not previously had edema.   Past Medical History  Diagnosis Date  . Osteopenia   . Anxiety   . Pneumonia     hx of 03/2008    Past Surgical History  Procedure Date  . Partial hysterectomy   . Breast lumpectomy     right  . Appendectomy   . Cesarean section     History   Social History  . Marital Status: Married    Spouse Name: N/A    Number of Children: N/A  . Years of Education: N/A   Occupational History  . Substitute Teacher    Social History Main Topics  . Smoking status: Never Smoker   . Smokeless tobacco: Never Used  . Alcohol Use: No  . Drug Use: No  . Sexually Active: Not on file   Other Topics Concern  . Not on file   Social History Narrative   Married, Retired, she was looking after her GGD, now in court battle    Current Outpatient Prescriptions on File Prior to Visit  Medication Sig Dispense Refill  . Cholecalciferol (VITAMIN D3) 1000 UNITS CAPS Take by mouth daily. Take total of 2 daily       . mometasone (NASONEX) 50 MCG/ACT nasal spray Place 1 spray into the nose daily.        Marland Kitchen loratadine (CLARITIN) 10 MG tablet Take 1 tablet (10 mg total) by mouth daily.  30 tablet  1    Allergies  Allergen Reactions  . Dulera     Loss of appetite, laryngitis   . Fluoxetine Hcl     REACTION: jumpy  . Lovastatin     Family History  Problem Relation Age of Onset  . Diabetes Sister   . Coronary artery disease Brother   . Dementia Brother     BP 132/68  Pulse 79  Temp(Src) 98.2 F (36.8 C) (Oral)  Ht 5' 2.5" (1.588 m)  Wt 111 lb 8 oz (50.576 kg)  BMI 20.07 kg/m2  SpO2 95%    Review of Systems Denies chest pain.      Objective:   Physical Exam VITAL SIGNS:  See vs page GENERAL: no distress Right arm  is in a sling Ext: 1+ bilat leg edema.       Assessment & Plan:  Edema, uncertain etiology, new

## 2011-02-14 ENCOUNTER — Telehealth: Payer: Self-pay | Admitting: *Deleted

## 2011-02-14 ENCOUNTER — Emergency Department (HOSPITAL_COMMUNITY)
Admission: EM | Admit: 2011-02-14 | Discharge: 2011-02-14 | Disposition: A | Discharge status: Home / Self Care | Payer: Medicare Other | Attending: Emergency Medicine | Admitting: Emergency Medicine

## 2011-02-14 DIAGNOSIS — M7989 Other specified soft tissue disorders: Secondary | ICD-10-CM | POA: Insufficient documentation

## 2011-02-14 DIAGNOSIS — R609 Edema, unspecified: Secondary | ICD-10-CM | POA: Insufficient documentation

## 2011-02-14 NOTE — Telephone Encounter (Signed)
Ok

## 2011-02-14 NOTE — Telephone Encounter (Signed)
Pt states that [place] to check for Blood clots in legs yesterday was negative and wants MD's advisement on what she can do regarding swelling in ankles and legs

## 2011-02-14 NOTE — Telephone Encounter (Signed)
Nurse from Dr. Stark Jock office called regarding pt-pt is still having pitting edema in both legs and there are no openings available this afternoon with any of our physicians-she states that she will call pt and recommend she go to the ER

## 2011-02-21 ENCOUNTER — Encounter: Payer: Self-pay | Admitting: Internal Medicine

## 2011-02-21 ENCOUNTER — Ambulatory Visit (INDEPENDENT_AMBULATORY_CARE_PROVIDER_SITE_OTHER): Payer: Medicare Other | Admitting: Internal Medicine

## 2011-02-21 VITALS — BP 118/50 | HR 96 | Temp 98.7°F | Resp 16 | Wt 110.0 lb

## 2011-02-21 DIAGNOSIS — F411 Generalized anxiety disorder: Secondary | ICD-10-CM

## 2011-02-21 DIAGNOSIS — M25519 Pain in unspecified shoulder: Secondary | ICD-10-CM

## 2011-02-21 DIAGNOSIS — F329 Major depressive disorder, single episode, unspecified: Secondary | ICD-10-CM

## 2011-02-21 DIAGNOSIS — M25511 Pain in right shoulder: Secondary | ICD-10-CM

## 2011-02-21 DIAGNOSIS — R609 Edema, unspecified: Secondary | ICD-10-CM

## 2011-02-21 DIAGNOSIS — J479 Bronchiectasis, uncomplicated: Secondary | ICD-10-CM

## 2011-02-21 DIAGNOSIS — R05 Cough: Secondary | ICD-10-CM

## 2011-02-21 NOTE — Assessment & Plan Note (Signed)
Being monitored x 4 mo F/u w/pulm

## 2011-02-21 NOTE — Assessment & Plan Note (Signed)
2012 rot cuff s/p surgery 10/12 Improving

## 2011-02-21 NOTE — Assessment & Plan Note (Signed)
S/p pulm consult - not better

## 2011-02-21 NOTE — Assessment & Plan Note (Signed)
Not on Rx 

## 2011-02-21 NOTE — Assessment & Plan Note (Signed)
LE swelling, likely NSAIDs related  10/12 Better off NSAIDs

## 2011-02-21 NOTE — Progress Notes (Signed)
  Subjective:    Patient ID: Julie Montgomery, female    DOB: 1936/03/25, 75 y.o.   MRN: [id number]  HPI F/u ER visit for leg swelling last wk; doppler US was ok; she saw Dr Everardo All as well C/o cough - not better. Her brother and her sister have lung ca - very worried...  Wt Readings from Last 3 Encounters:  02/21/11 110 lb (49.896 kg)  02/13/11 111 lb 8 oz (50.576 kg)  01/30/11 107 lb (48.535 kg)     Review of Systems  Constitutional: Negative for chills, activity change, appetite change, fatigue and unexpected weight change.  HENT: Negative for congestion, mouth sores and sinus pressure.   Eyes: Negative for visual disturbance.  Respiratory: Positive for cough. Negative for chest tightness, shortness of breath and wheezing.   Cardiovascular: Negative for chest pain.  Gastrointestinal: Negative for nausea and abdominal pain.  Genitourinary: Negative for frequency, difficulty urinating and vaginal pain.  Musculoskeletal: Negative for back pain and gait problem.  Skin: Negative for pallor and rash.  Neurological: Negative for dizziness, tremors, weakness, numbness and headaches.  Psychiatric/Behavioral: Negative for confusion and sleep disturbance.       Objective:   Physical Exam  Constitutional: She appears well-developed. No distress.       thin  HENT:  Head: Normocephalic.  Right Ear: External ear normal.  Left Ear: External ear normal.  Nose: Nose normal.  Mouth/Throat: Oropharynx is clear and moist.  Eyes: Conjunctivae are normal. Pupils are equal, round, and reactive to light. Right eye exhibits no discharge. Left eye exhibits no discharge.  Neck: Normal range of motion. Neck supple. No JVD present. No tracheal deviation present. No thyromegaly present.  Cardiovascular: Normal rate, regular rhythm and normal heart sounds.   Pulmonary/Chest: No stridor. No respiratory distress. She has no wheezes.  Abdominal: Soft. Bowel sounds are normal. She exhibits no distension and  no mass. There is no tenderness. There is no rebound and no guarding.  Musculoskeletal: She exhibits no edema and no tenderness.  Lymphadenopathy:    She has no cervical adenopathy.  Neurological: She displays normal reflexes. No cranial nerve deficit. She exhibits normal muscle tone. Coordination normal.  Skin: No rash noted. No erythema.  Psychiatric: She has a normal mood and affect. Her behavior is normal. Judgment and thought content normal.  Anxious R arm is in a sling  Chest CT reviewed, ven doppler      Assessment & Plan:

## 2011-02-21 NOTE — Assessment & Plan Note (Signed)
Better  

## 2011-02-26 ENCOUNTER — Telehealth: Payer: Self-pay | Admitting: *Deleted

## 2011-02-26 ENCOUNTER — Institutional Professional Consult (permissible substitution): Payer: Medicare Other | Admitting: Internal Medicine

## 2011-02-26 DIAGNOSIS — R05 Cough: Secondary | ICD-10-CM

## 2011-02-26 NOTE — Telephone Encounter (Signed)
She wants to go to [facility] for a pulm cons Will ref - done Thx

## 2011-02-26 NOTE — Telephone Encounter (Signed)
Patient requesting a call back from MD.

## 2011-04-03 ENCOUNTER — Ambulatory Visit (INDEPENDENT_AMBULATORY_CARE_PROVIDER_SITE_OTHER): Payer: Medicare Other | Admitting: Internal Medicine

## 2011-04-03 ENCOUNTER — Other Ambulatory Visit (INDEPENDENT_AMBULATORY_CARE_PROVIDER_SITE_OTHER): Payer: Medicare Other

## 2011-04-03 ENCOUNTER — Encounter: Payer: Self-pay | Admitting: Internal Medicine

## 2011-04-03 VITALS — BP 138/70 | HR 84 | Temp 98.7°F | Resp 16 | Wt 103.0 lb

## 2011-04-03 DIAGNOSIS — R634 Abnormal weight loss: Secondary | ICD-10-CM

## 2011-04-03 DIAGNOSIS — M25511 Pain in right shoulder: Secondary | ICD-10-CM

## 2011-04-03 DIAGNOSIS — Z7189 Other specified counseling: Secondary | ICD-10-CM | POA: Insufficient documentation

## 2011-04-03 DIAGNOSIS — Z136 Encounter for screening for cardiovascular disorders: Secondary | ICD-10-CM

## 2011-04-03 DIAGNOSIS — Z0000 Encounter for general adult medical examination without abnormal findings: Secondary | ICD-10-CM

## 2011-04-03 DIAGNOSIS — F329 Major depressive disorder, single episode, unspecified: Secondary | ICD-10-CM

## 2011-04-03 DIAGNOSIS — M25519 Pain in unspecified shoulder: Secondary | ICD-10-CM

## 2011-04-03 LAB — URINALYSIS
Ketones, ur: NEGATIVE
Leukocytes, UA: NEGATIVE
Specific Gravity, Urine: 1.025 (ref 1.000–1.030)
Total Protein, Urine: NEGATIVE
pH: 6 (ref 5.0–8.0)

## 2011-04-03 LAB — HEPATIC FUNCTION PANEL
ALT: 14 U/L (ref 0–35)
Alkaline Phosphatase: 89 U/L (ref 39–117)
Bilirubin, Direct: 0.1 mg/dL (ref 0.0–0.3)
Total Bilirubin: 0.4 mg/dL (ref 0.3–1.2)

## 2011-04-03 LAB — CBC WITH DIFFERENTIAL/PLATELET
Basophils Relative: 0.5 % (ref 0.0–3.0)
Eosinophils Relative: 1.6 % (ref 0.0–5.0)
HCT: 35.2 % — ABNORMAL LOW (ref 36.0–46.0)
Hemoglobin: 11.8 g/dL — ABNORMAL LOW (ref 12.0–15.0)
Lymphs Abs: 1.3 10*3/uL (ref 0.7–4.0)
MCV: 90.1 fl (ref 78.0–100.0)
Monocytes Absolute: 0.5 10*3/uL (ref 0.1–1.0)
Monocytes Relative: 5.4 % (ref 3.0–12.0)
Neutro Abs: 6.9 10*3/uL (ref 1.4–7.7)
Platelets: 349 10*3/uL (ref 150.0–400.0)
RBC: 3.91 Mil/uL (ref 3.87–5.11)
WBC: 8.8 10*3/uL (ref 4.5–10.5)

## 2011-04-03 LAB — LIPID PANEL
HDL: 50.5 mg/dL (ref >39.00)
LDL Cholesterol: 95 mg/dL (ref 0–99)
Total CHOL/HDL Ratio: 3
Triglycerides: 67 mg/dL (ref 0.0–149.0)

## 2011-04-03 LAB — BASIC METABOLIC PANEL
Chloride: 106 mEq/L (ref 96–112)
GFR: 96.66 mL/min (ref >60.00)
Potassium: 4.2 mEq/L (ref 3.5–5.1)
Sodium: 141 mEq/L (ref 135–145)

## 2011-04-03 LAB — TSH: TSH: 2.44 u[IU]/mL (ref 0.35–5.50)

## 2011-04-03 MED ORDER — TRAMADOL HCL 50 MG PO TABS: 50.0000 mg | ORAL_TABLET | Freq: Two times a day (BID) | ORAL | Status: AC | PRN

## 2011-04-03 MED ORDER — MELOXICAM 7.5 MG PO TABS: 7.5000 mg | ORAL_TABLET | Freq: Every day | ORAL | Status: DC

## 2011-04-03 MED ORDER — MOMETASONE FUROATE 50 MCG/ACT NA SUSP: 1.0000 | Freq: Every day | NASAL | Status: DC

## 2011-04-03 NOTE — Progress Notes (Signed)
  Subjective:    Patient ID: Julie Montgomery, female    DOB: 10/11/1935, 75 y.o.   MRN: [id number]  HPI  The patient is here for a wellness exam. The patient has been doing well overall without major physical or psychological issues going on lately. The patient had a R sh rot cuff surgery in Oct 12  Review of Systems  Constitutional: Negative for fever, chills, diaphoresis, activity change, appetite change, fatigue and unexpected weight change.  HENT: Negative for hearing loss, ear pain, congestion, sore throat, sneezing, mouth sores, neck pain, dental problem, voice change, postnasal drip and sinus pressure.   Eyes: Negative for pain and visual disturbance.  Respiratory: Negative for cough, chest tightness, wheezing and stridor.   Cardiovascular: Negative for chest pain, palpitations and leg swelling.  Gastrointestinal: Negative for nausea, vomiting, abdominal pain, blood in stool, abdominal distention and rectal pain.  Genitourinary: Negative for dysuria, hematuria, decreased urine volume, vaginal bleeding, vaginal discharge, difficulty urinating, vaginal pain and menstrual problem.  Musculoskeletal: Negative for back pain, joint swelling and gait problem.       R shoulder hurts  Skin: Negative for color change, rash and wound.  Neurological: Negative for dizziness, tremors, syncope, speech difficulty and light-headedness.  Hematological: Negative for adenopathy.  Psychiatric/Behavioral: Negative for suicidal ideas, hallucinations, behavioral problems, confusion, sleep disturbance, dysphoric mood and decreased concentration. The patient is not hyperactive.        Objective:   Physical Exam  Constitutional: She appears well-developed and well-nourished. No distress.  HENT:  Head: Normocephalic.  Right Ear: External ear normal.  Left Ear: External ear normal.  Nose: Nose normal.  Mouth/Throat: Oropharynx is clear and moist.  Eyes: Conjunctivae are normal. Pupils are equal, round,  and reactive to light. Right eye exhibits no discharge. Left eye exhibits no discharge.  Neck: Normal range of motion. Neck supple. No JVD present. No tracheal deviation present. No thyromegaly present.  Cardiovascular: Normal rate, regular rhythm and normal heart sounds.   Pulmonary/Chest: No stridor. No respiratory distress. She has no wheezes.  Abdominal: Soft. Bowel sounds are normal. She exhibits no distension and no mass. There is no tenderness. There is no rebound and no guarding.  Musculoskeletal: She exhibits tenderness. She exhibits no edema.       R shoulder hurts w/ROM  Lymphadenopathy:    She has no cervical adenopathy.  Neurological: She displays normal reflexes. No cranial nerve deficit. She exhibits normal muscle tone. Coordination normal.  Skin: No rash noted. No erythema.  Psychiatric: She has a normal mood and affect. Her behavior is normal. Judgment and thought content normal.          Assessment & Plan:

## 2011-04-03 NOTE — Assessment & Plan Note (Addendum)
Still in pain - start Tramadol and PT

## 2011-04-03 NOTE — Assessment & Plan Note (Signed)
Wt Readings from Last 3 Encounters:  04/03/11 103 lb (46.72 kg)  02/21/11 110 lb (49.896 kg)  02/13/11 111 lb 8 oz (50.576 kg)

## 2011-04-03 NOTE — Patient Instructions (Signed)
Eat better Boost

## 2011-04-04 ENCOUNTER — Telehealth: Payer: Self-pay | Admitting: Internal Medicine

## 2011-04-04 ENCOUNTER — Encounter: Payer: Self-pay | Admitting: Internal Medicine

## 2011-04-04 NOTE — Assessment & Plan Note (Signed)
Better. Discussed 

## 2011-04-04 NOTE — Telephone Encounter (Signed)
Stacey, please, inform patient that all labs are ok Thx 

## 2011-04-04 NOTE — Assessment & Plan Note (Signed)
The patient is here for annual Medicare wellness examination and management of other chronic and acute problems.   The risk factors are reflected in the social history.  The roster of all physicians providing medical care to patient - is listed in the Snapshot section of the chart.  Activities of daily living:  The patient is 100% inedpendent in all ADLs: dressing, toileting, feeding as well as independent mobility  Home safety : The patient has smoke detectors in the home. They wear seatbelts.No firearms at home ( firearms are present in the home, kept in a safe fashion). There is no violence in the home.   There is no risks for hepatitis, STDs or HIV. There is no   history of blood transfusion. They have no travel history to infectious disease endemic areas of the world.  The patient has (has not) seen their dentist in the last six month. They have (not) seen their eye doctor in the last year. They deny (admit to) any hearing difficulty and have not had audiologic testing in the last year.  They do not  have excessive sun exposure. Discussed the need for sun protection: hats, long sleeves and use of sunscreen if there is significant sun exposure.   Diet: the importance of a healthy diet is discussed. They do have a healthy (unhealthy-high fat/fast food) diet.  The patient has a regular exercise program: no - in pain post-op.  The benefits of regular aerobic exercise were discussed.  Depression screen: there are no signs or vegative symptoms of depression- irritability, change in appetite, anhedonia, sadness/tearfullness.  Cognitive assessment: the patient manages all their financial and personal affairs and is actively engaged. They could relate day,date,year and events; recalled 3/3 objects at 3 minutes; performed clock-face test normally.  The following portions of the patient's history were reviewed and updated as appropriate: allergies, current medications, past family history, past  medical history,  past surgical history, past social history  and problem list.  Vision, hearing, body mass index were assessed and reviewed.   During the course of the visit the patient was educated and counseled about appropriate screening and preventive services including : fall prevention , diabetes screening, nutrition counseling, colorectal cancer screening, and recommended immunizations.  

## 2011-04-05 ENCOUNTER — Ambulatory Visit: Payer: Medicare Other | Admitting: Endocrinology

## 2011-04-05 ENCOUNTER — Encounter: Payer: Self-pay | Admitting: Internal Medicine

## 2011-04-05 ENCOUNTER — Ambulatory Visit (INDEPENDENT_AMBULATORY_CARE_PROVIDER_SITE_OTHER): Payer: Medicare Other | Admitting: Internal Medicine

## 2011-04-05 DIAGNOSIS — R197 Diarrhea, unspecified: Secondary | ICD-10-CM | POA: Insufficient documentation

## 2011-04-05 DIAGNOSIS — J069 Acute upper respiratory infection, unspecified: Secondary | ICD-10-CM

## 2011-04-05 DIAGNOSIS — M25511 Pain in right shoulder: Secondary | ICD-10-CM

## 2011-04-05 DIAGNOSIS — M25519 Pain in unspecified shoulder: Secondary | ICD-10-CM

## 2011-04-05 DIAGNOSIS — F329 Major depressive disorder, single episode, unspecified: Secondary | ICD-10-CM

## 2011-04-05 MED ORDER — ALIGN 4 MG PO CAPS: 1.0000 | ORAL_CAPSULE | Freq: Every day | ORAL | Status: DC

## 2011-04-05 MED ORDER — LOPERAMIDE HCL 2 MG PO TABS: 2.0000 mg | ORAL_TABLET | Freq: Four times a day (QID) | ORAL | Status: DC | PRN

## 2011-04-05 MED ORDER — AZITHROMYCIN 250 MG PO TABS: ORAL_TABLET | ORAL | Status: AC

## 2011-04-05 MED ORDER — RANITIDINE HCL 150 MG PO TABS: 150.0000 mg | ORAL_TABLET | Freq: Two times a day (BID) | ORAL | Status: DC

## 2011-04-05 NOTE — Assessment & Plan Note (Signed)
Discussed.

## 2011-04-05 NOTE — Assessment & Plan Note (Signed)
Restart Tramadol, restart PT

## 2011-04-05 NOTE — Telephone Encounter (Signed)
Pt had Ov today 04-05-11.

## 2011-04-05 NOTE — Progress Notes (Signed)
  Subjective:    Patient ID: Julie Montgomery, female    DOB: 1935/11/18, 75 y.o.   MRN: [id number]  HPI   HPI  C/o URI sx's x 2 days. C/o ST, cough, weakness, diarrhea yest am. Not better with OTC medicines. Actually, the patient is getting worse. The patient did not sleep last night due to cough. No recent abx  Review of Systems  Constitutional: Positive for fever, chills and fatigue.  HENT: Positive for congestion, rhinorrhea, sneezing and postnasal drip.   Eyes: Positive for photophobia and pain. Negative for discharge and visual disturbance.  Respiratory: Positive for cough and wheezing.   Positive for chest pain.  Gastrointestinal: Pos. for vomiting, abdominal pain, diarrhea and abdominal distention.  Genitourinary: Negative for dysuria and difficulty urinating.  Skin: Negative for rash.  Neurological: Positive for dizziness, weakness and light-headedness.      Review of Systems  Constitutional: Positive for fatigue. Negative for chills, activity change, appetite change and unexpected weight change.  HENT: Negative for congestion, mouth sores and sinus pressure.   Eyes: Negative for visual disturbance.  Respiratory: Positive for cough. Negative for chest tightness.   Cardiovascular: Positive for chest pain.  Gastrointestinal: Positive for abdominal pain and diarrhea. Negative for nausea.  Genitourinary: Negative for frequency, difficulty urinating and vaginal pain.  Musculoskeletal: Negative for back pain and gait problem.  Skin: Negative for pallor and rash.  Neurological: Negative for dizziness, tremors, weakness, numbness and headaches.  Psychiatric/Behavioral: Negative for confusion and sleep disturbance.       Objective:   Physical Exam  Constitutional: She appears well-developed and well-nourished. No distress.  HENT:  Head: Normocephalic.  Right Ear: External ear normal.  Left Ear: External ear normal.  Nose: Nose normal.       eryth throat  Eyes:  Conjunctivae are normal. Pupils are equal, round, and reactive to light. Right eye exhibits no discharge. Left eye exhibits no discharge.  Neck: Normal range of motion. Neck supple. No JVD present. No tracheal deviation present. No thyromegaly present.  Cardiovascular: Normal rate, regular rhythm and normal heart sounds.   Pulmonary/Chest: No stridor. No respiratory distress. She has no wheezes.  Abdominal: Soft. Bowel sounds are normal. She exhibits no distension and no mass. There is tenderness (mild diffuse). There is no rebound and no guarding.  Musculoskeletal: She exhibits no edema and no tenderness.  Lymphadenopathy:    She has no cervical adenopathy.  Neurological: She displays normal reflexes. No cranial nerve deficit. She exhibits normal muscle tone. Coordination normal.  Skin: No rash noted. No erythema.  Psychiatric: She has a normal mood and affect. Her behavior is normal. Judgment and thought content normal.          Assessment & Plan:

## 2011-04-06 ENCOUNTER — Ambulatory Visit: Payer: Medicare Other | Admitting: Internal Medicine

## 2011-04-26 ENCOUNTER — Telehealth: Payer: Self-pay

## 2011-04-26 MED ORDER — CYCLOBENZAPRINE HCL 5 MG PO TABS: 5.0000 mg | ORAL_TABLET | Freq: Two times a day (BID) | ORAL | Status: AC | PRN

## 2011-04-26 NOTE — Telephone Encounter (Signed)
Done - Flexeril Thx

## 2011-04-26 NOTE — Telephone Encounter (Signed)
Pt advised of Rx/pharmacy 

## 2011-04-26 NOTE — Telephone Encounter (Signed)
Pt called requesting Rx for a muscle relaxer. Pt stated that she is currently in Physical Therapy but believes that medication would help as well. She says this was discussed at last OV?

## 2011-04-27 ENCOUNTER — Other Ambulatory Visit: Payer: Self-pay | Admitting: Internal Medicine

## 2011-04-27 DIAGNOSIS — Z1231 Encounter for screening mammogram for malignant neoplasm of breast: Secondary | ICD-10-CM

## 2011-05-02 DIAGNOSIS — M779 Enthesopathy, unspecified: Secondary | ICD-10-CM | POA: Diagnosis not present

## 2011-05-02 DIAGNOSIS — M24119 Other articular cartilage disorders, unspecified shoulder: Secondary | ICD-10-CM | POA: Diagnosis not present

## 2011-05-02 DIAGNOSIS — M75120 Complete rotator cuff tear or rupture of unspecified shoulder, not specified as traumatic: Secondary | ICD-10-CM | POA: Diagnosis not present

## 2011-05-07 DIAGNOSIS — M24119 Other articular cartilage disorders, unspecified shoulder: Secondary | ICD-10-CM | POA: Diagnosis not present

## 2011-05-07 DIAGNOSIS — M19019 Primary osteoarthritis, unspecified shoulder: Secondary | ICD-10-CM | POA: Diagnosis not present

## 2011-05-07 DIAGNOSIS — M75120 Complete rotator cuff tear or rupture of unspecified shoulder, not specified as traumatic: Secondary | ICD-10-CM | POA: Diagnosis not present

## 2011-05-09 DIAGNOSIS — M779 Enthesopathy, unspecified: Secondary | ICD-10-CM | POA: Diagnosis not present

## 2011-05-09 DIAGNOSIS — M19019 Primary osteoarthritis, unspecified shoulder: Secondary | ICD-10-CM | POA: Diagnosis not present

## 2011-05-09 DIAGNOSIS — M75120 Complete rotator cuff tear or rupture of unspecified shoulder, not specified as traumatic: Secondary | ICD-10-CM | POA: Diagnosis not present

## 2011-05-11 DIAGNOSIS — M19019 Primary osteoarthritis, unspecified shoulder: Secondary | ICD-10-CM | POA: Diagnosis not present

## 2011-05-11 DIAGNOSIS — M75120 Complete rotator cuff tear or rupture of unspecified shoulder, not specified as traumatic: Secondary | ICD-10-CM | POA: Diagnosis not present

## 2011-05-11 DIAGNOSIS — M779 Enthesopathy, unspecified: Secondary | ICD-10-CM | POA: Diagnosis not present

## 2011-05-15 DIAGNOSIS — M19019 Primary osteoarthritis, unspecified shoulder: Secondary | ICD-10-CM | POA: Diagnosis not present

## 2011-05-15 DIAGNOSIS — M779 Enthesopathy, unspecified: Secondary | ICD-10-CM | POA: Diagnosis not present

## 2011-05-15 DIAGNOSIS — M75120 Complete rotator cuff tear or rupture of unspecified shoulder, not specified as traumatic: Secondary | ICD-10-CM | POA: Diagnosis not present

## 2011-05-21 DIAGNOSIS — M75120 Complete rotator cuff tear or rupture of unspecified shoulder, not specified as traumatic: Secondary | ICD-10-CM | POA: Diagnosis not present

## 2011-05-21 DIAGNOSIS — M19019 Primary osteoarthritis, unspecified shoulder: Secondary | ICD-10-CM | POA: Diagnosis not present

## 2011-05-21 DIAGNOSIS — M779 Enthesopathy, unspecified: Secondary | ICD-10-CM | POA: Diagnosis not present

## 2011-05-23 DIAGNOSIS — M779 Enthesopathy, unspecified: Secondary | ICD-10-CM | POA: Diagnosis not present

## 2011-05-23 DIAGNOSIS — M19019 Primary osteoarthritis, unspecified shoulder: Secondary | ICD-10-CM | POA: Diagnosis not present

## 2011-05-23 DIAGNOSIS — M75120 Complete rotator cuff tear or rupture of unspecified shoulder, not specified as traumatic: Secondary | ICD-10-CM | POA: Diagnosis not present

## 2011-05-25 ENCOUNTER — Ambulatory Visit (INDEPENDENT_AMBULATORY_CARE_PROVIDER_SITE_OTHER): Payer: Medicare Other | Admitting: Internal Medicine

## 2011-05-25 ENCOUNTER — Encounter: Payer: Self-pay | Admitting: Internal Medicine

## 2011-05-25 DIAGNOSIS — H6090 Unspecified otitis externa, unspecified ear: Secondary | ICD-10-CM

## 2011-05-25 DIAGNOSIS — H6123 Impacted cerumen, bilateral: Secondary | ICD-10-CM | POA: Insufficient documentation

## 2011-05-25 DIAGNOSIS — M25511 Pain in right shoulder: Secondary | ICD-10-CM

## 2011-05-25 DIAGNOSIS — Z111 Encounter for screening for respiratory tuberculosis: Secondary | ICD-10-CM | POA: Diagnosis not present

## 2011-05-25 DIAGNOSIS — Z23 Encounter for immunization: Secondary | ICD-10-CM

## 2011-05-25 DIAGNOSIS — H60399 Other infective otitis externa, unspecified ear: Secondary | ICD-10-CM | POA: Diagnosis not present

## 2011-05-25 DIAGNOSIS — F329 Major depressive disorder, single episode, unspecified: Secondary | ICD-10-CM

## 2011-05-25 DIAGNOSIS — H6120 Impacted cerumen, unspecified ear: Secondary | ICD-10-CM

## 2011-05-25 DIAGNOSIS — M25519 Pain in unspecified shoulder: Secondary | ICD-10-CM

## 2011-05-25 DIAGNOSIS — F411 Generalized anxiety disorder: Secondary | ICD-10-CM

## 2011-05-25 MED ORDER — NEOMYCIN-POLYMYXIN-HC 3.5-10000-1 OT SOLN: 3.0000 [drp] | Freq: Three times a day (TID) | OTIC | Status: AC

## 2011-05-25 NOTE — Progress Notes (Signed)
  Subjective:    Patient ID: Julie Montgomery, female    DOB: [birth year], 76 y.o.   MRN: [id number]  HPI  F/u R shoulder pain C/o throat drainage Asking for a TB shot URI has resolved, some residual cough is present  Review of Systems  Constitutional: Positive for fatigue. Negative for chills, activity change, appetite change and unexpected weight change.  HENT: Negative for congestion, mouth sores and sinus pressure.   Eyes: Negative for visual disturbance.  Respiratory: Positive for cough (mild). Negative for chest tightness.   Gastrointestinal: Negative for nausea and abdominal pain.  Genitourinary: Negative for frequency, difficulty urinating and vaginal pain.  Musculoskeletal: Positive for arthralgias. Negative for back pain and gait problem.  Skin: Negative for pallor and rash.  Neurological: Negative for dizziness, tremors, weakness, numbness and headaches.  Psychiatric/Behavioral: Negative for suicidal ideas, confusion and sleep disturbance. The patient is nervous/anxious.    Wt Readings from Last 3 Encounters:  05/25/11 108 lb (48.988 kg)  04/03/11 103 lb (46.72 kg)  02/21/11 110 lb (49.896 kg)       Objective:   Physical Exam  Constitutional: She appears well-developed. No distress.       Thin   HENT:  Head: Normocephalic.  Right Ear: External ear normal.  Left Ear: External ear normal.  Nose: Nose normal.  Mouth/Throat: Oropharynx is clear and moist.  Eyes: Conjunctivae are normal. Pupils are equal, round, and reactive to light. Right eye exhibits no discharge. Left eye exhibits no discharge.  Neck: Normal range of motion. Neck supple. No JVD present. No tracheal deviation present. No thyromegaly present.  Cardiovascular: Normal rate, regular rhythm and normal heart sounds.   Pulmonary/Chest: No stridor. No respiratory distress. She has no wheezes.  Abdominal: Soft. Bowel sounds are normal. She exhibits no distension and no mass. There is no tenderness. There is  no rebound and no guarding.  Musculoskeletal: She exhibits no edema and no tenderness.  Lymphadenopathy:    She has no cervical adenopathy.  Neurological: She displays normal reflexes. No cranial nerve deficit. She exhibits normal muscle tone. Coordination normal.  Skin: No rash noted. No erythema.  Psychiatric: She has a normal mood and affect. Her behavior is normal. Judgment and thought content normal.  Wax B   Procedure Note :     Procedure :  Ear irrigation R>L   Indication:  Cerumen impaction   Risks, including pain, dizziness, eardrum perforation, bleeding, infection and others as well as benefits were explained to the patient in detail. Verbal consent was obtained and the patient agreed to proceed.    We used "The [employer] Device" field with lukewarm water for irrigation. A large amount wax was recovered from the L ear. Procedure has also required manual wax removal with an ear loop. We had to stop irrigating R ear due to pain.   Tolerated well otherwise. Complications: None.   Postprocedure instructions :  Call if problems. Cortisporin gtt.          Assessment & Plan:

## 2011-05-26 ENCOUNTER — Encounter: Payer: Self-pay | Admitting: Internal Medicine

## 2011-05-26 NOTE — Assessment & Plan Note (Signed)
Cortisporin otic

## 2011-05-26 NOTE — Assessment & Plan Note (Signed)
Discussed.

## 2011-05-26 NOTE — Assessment & Plan Note (Signed)
Doing better - getting PT Tramadol prn

## 2011-05-26 NOTE — Assessment & Plan Note (Signed)
Irrigated B ears - see procedure

## 2011-05-28 ENCOUNTER — Telehealth: Payer: Self-pay

## 2011-05-28 LAB — TB SKIN TEST: TB Skin Test: NEGATIVE mm

## 2011-05-28 NOTE — Telephone Encounter (Signed)
[facility] Triage Call Report Triage Record Num: [id number] Operator: Karenann Cai Patient Name: Collier Monica Call Date & Time: 05/26/2011 2:01:24PM Patient Phone: [phone] PCP: Sonda Primes Patient Gender: Female PCP Fax : [phone] Patient DOB: 04-27-1936 [facility] Name[facility] Reason for Call: Caller: Shakeeta/Patient; PCP: Sonda Primes; CB#: [phone]; call reason: pneumonia booster shot yesterday. Left arm: red and as large as an egg. Afebrile. RN reviewed allergic reaction, severe care advice with patient. Patient to be seen again on Monday to have TB test read. Patient advised to call back anytime. RN advised patient she can apply cool and hot compresses to site. Protocol(s) Used: Allergic Reaction, Severe Recommended Outcome per Protocol: Provide Home/Self Care Reason for Outcome: All other situations Care Advice: Nonprescription oral antihistamines (such as Benadryl, Allerest, Chlor-Trimetron, etc.) may help relieve symptoms. - Use as directed on package label or by pharmacist. - Antihistamine medication may cause drowsiness and should be taken with caution by adults 75 years or older. Consider a non-sedating antihistamine such as Claritin now available without a prescription. ~ 05/26/2011 2:15:59PM Page 1 of 1 [email or web address]

## 2011-05-28 NOTE — Telephone Encounter (Signed)
The pt was seen - L arm resolving hematoma TB test site with a bruise too (- result)  She was asked to take vit C  Thx

## 2011-05-29 ENCOUNTER — Ambulatory Visit
Admission: RE | Admit: 2011-05-29 | Discharge: 2011-05-29 | Disposition: A | Discharge status: Home / Self Care | Payer: Medicare Other | Source: Ambulatory Visit | Attending: Internal Medicine | Admitting: Internal Medicine

## 2011-05-29 DIAGNOSIS — M19019 Primary osteoarthritis, unspecified shoulder: Secondary | ICD-10-CM | POA: Diagnosis not present

## 2011-05-29 DIAGNOSIS — Z1231 Encounter for screening mammogram for malignant neoplasm of breast: Secondary | ICD-10-CM | POA: Diagnosis not present

## 2011-05-29 DIAGNOSIS — M75120 Complete rotator cuff tear or rupture of unspecified shoulder, not specified as traumatic: Secondary | ICD-10-CM | POA: Diagnosis not present

## 2011-05-29 DIAGNOSIS — M779 Enthesopathy, unspecified: Secondary | ICD-10-CM | POA: Diagnosis not present

## 2011-06-01 DIAGNOSIS — M75120 Complete rotator cuff tear or rupture of unspecified shoulder, not specified as traumatic: Secondary | ICD-10-CM | POA: Diagnosis not present

## 2011-06-04 DIAGNOSIS — M75120 Complete rotator cuff tear or rupture of unspecified shoulder, not specified as traumatic: Secondary | ICD-10-CM | POA: Diagnosis not present

## 2011-06-04 DIAGNOSIS — M19019 Primary osteoarthritis, unspecified shoulder: Secondary | ICD-10-CM | POA: Diagnosis not present

## 2011-06-04 DIAGNOSIS — M779 Enthesopathy, unspecified: Secondary | ICD-10-CM | POA: Diagnosis not present

## 2011-06-06 DIAGNOSIS — M19019 Primary osteoarthritis, unspecified shoulder: Secondary | ICD-10-CM | POA: Diagnosis not present

## 2011-06-06 DIAGNOSIS — M779 Enthesopathy, unspecified: Secondary | ICD-10-CM | POA: Diagnosis not present

## 2011-06-06 DIAGNOSIS — M75120 Complete rotator cuff tear or rupture of unspecified shoulder, not specified as traumatic: Secondary | ICD-10-CM | POA: Diagnosis not present

## 2011-06-11 DIAGNOSIS — M75120 Complete rotator cuff tear or rupture of unspecified shoulder, not specified as traumatic: Secondary | ICD-10-CM | POA: Diagnosis not present

## 2011-06-11 DIAGNOSIS — M779 Enthesopathy, unspecified: Secondary | ICD-10-CM | POA: Diagnosis not present

## 2011-06-11 DIAGNOSIS — M19019 Primary osteoarthritis, unspecified shoulder: Secondary | ICD-10-CM | POA: Diagnosis not present

## 2011-06-13 DIAGNOSIS — R9389 Abnormal findings on diagnostic imaging of other specified body structures: Secondary | ICD-10-CM | POA: Diagnosis not present

## 2011-06-13 DIAGNOSIS — R599 Enlarged lymph nodes, unspecified: Secondary | ICD-10-CM | POA: Diagnosis not present

## 2011-06-13 DIAGNOSIS — J40 Bronchitis, not specified as acute or chronic: Secondary | ICD-10-CM | POA: Diagnosis not present

## 2011-06-13 DIAGNOSIS — J189 Pneumonia, unspecified organism: Secondary | ICD-10-CM | POA: Diagnosis not present

## 2011-06-15 DIAGNOSIS — M75120 Complete rotator cuff tear or rupture of unspecified shoulder, not specified as traumatic: Secondary | ICD-10-CM | POA: Diagnosis not present

## 2011-06-15 DIAGNOSIS — M19019 Primary osteoarthritis, unspecified shoulder: Secondary | ICD-10-CM | POA: Diagnosis not present

## 2011-06-15 DIAGNOSIS — M779 Enthesopathy, unspecified: Secondary | ICD-10-CM | POA: Diagnosis not present

## 2011-06-20 DIAGNOSIS — M19019 Primary osteoarthritis, unspecified shoulder: Secondary | ICD-10-CM | POA: Diagnosis not present

## 2011-06-20 DIAGNOSIS — M779 Enthesopathy, unspecified: Secondary | ICD-10-CM | POA: Diagnosis not present

## 2011-06-20 DIAGNOSIS — M75120 Complete rotator cuff tear or rupture of unspecified shoulder, not specified as traumatic: Secondary | ICD-10-CM | POA: Diagnosis not present

## 2011-06-22 DIAGNOSIS — M75120 Complete rotator cuff tear or rupture of unspecified shoulder, not specified as traumatic: Secondary | ICD-10-CM | POA: Diagnosis not present

## 2011-06-22 DIAGNOSIS — M779 Enthesopathy, unspecified: Secondary | ICD-10-CM | POA: Diagnosis not present

## 2011-06-22 DIAGNOSIS — M19019 Primary osteoarthritis, unspecified shoulder: Secondary | ICD-10-CM | POA: Diagnosis not present

## 2011-06-27 ENCOUNTER — Ambulatory Visit (INDEPENDENT_AMBULATORY_CARE_PROVIDER_SITE_OTHER): Payer: Medicare Other | Admitting: Obstetrics and Gynecology

## 2011-06-27 DIAGNOSIS — M24119 Other articular cartilage disorders, unspecified shoulder: Secondary | ICD-10-CM | POA: Diagnosis not present

## 2011-06-27 DIAGNOSIS — Z124 Encounter for screening for malignant neoplasm of cervix: Secondary | ICD-10-CM

## 2011-06-27 DIAGNOSIS — M19019 Primary osteoarthritis, unspecified shoulder: Secondary | ICD-10-CM | POA: Diagnosis not present

## 2011-06-27 DIAGNOSIS — M779 Enthesopathy, unspecified: Secondary | ICD-10-CM | POA: Diagnosis not present

## 2011-06-27 DIAGNOSIS — M75120 Complete rotator cuff tear or rupture of unspecified shoulder, not specified as traumatic: Secondary | ICD-10-CM | POA: Diagnosis not present

## 2011-07-04 DIAGNOSIS — M75120 Complete rotator cuff tear or rupture of unspecified shoulder, not specified as traumatic: Secondary | ICD-10-CM | POA: Diagnosis not present

## 2011-07-05 DIAGNOSIS — R918 Other nonspecific abnormal finding of lung field: Secondary | ICD-10-CM | POA: Diagnosis not present

## 2011-07-05 DIAGNOSIS — R9389 Abnormal findings on diagnostic imaging of other specified body structures: Secondary | ICD-10-CM | POA: Diagnosis not present

## 2011-07-13 DIAGNOSIS — M75120 Complete rotator cuff tear or rupture of unspecified shoulder, not specified as traumatic: Secondary | ICD-10-CM | POA: Diagnosis not present

## 2011-07-13 DIAGNOSIS — M19019 Primary osteoarthritis, unspecified shoulder: Secondary | ICD-10-CM | POA: Diagnosis not present

## 2011-07-20 DIAGNOSIS — M75120 Complete rotator cuff tear or rupture of unspecified shoulder, not specified as traumatic: Secondary | ICD-10-CM | POA: Diagnosis not present

## 2011-07-20 DIAGNOSIS — M19019 Primary osteoarthritis, unspecified shoulder: Secondary | ICD-10-CM | POA: Diagnosis not present

## 2011-07-24 DIAGNOSIS — M19019 Primary osteoarthritis, unspecified shoulder: Secondary | ICD-10-CM | POA: Diagnosis not present

## 2011-07-24 DIAGNOSIS — M24119 Other articular cartilage disorders, unspecified shoulder: Secondary | ICD-10-CM | POA: Diagnosis not present

## 2011-07-24 DIAGNOSIS — M75120 Complete rotator cuff tear or rupture of unspecified shoulder, not specified as traumatic: Secondary | ICD-10-CM | POA: Diagnosis not present

## 2011-07-25 DIAGNOSIS — M75120 Complete rotator cuff tear or rupture of unspecified shoulder, not specified as traumatic: Secondary | ICD-10-CM | POA: Diagnosis not present

## 2011-07-25 DIAGNOSIS — M24119 Other articular cartilage disorders, unspecified shoulder: Secondary | ICD-10-CM | POA: Diagnosis not present

## 2011-07-25 DIAGNOSIS — M779 Enthesopathy, unspecified: Secondary | ICD-10-CM | POA: Diagnosis not present

## 2011-07-25 DIAGNOSIS — M19019 Primary osteoarthritis, unspecified shoulder: Secondary | ICD-10-CM | POA: Diagnosis not present

## 2011-07-31 DIAGNOSIS — M24119 Other articular cartilage disorders, unspecified shoulder: Secondary | ICD-10-CM | POA: Diagnosis not present

## 2011-07-31 DIAGNOSIS — M19019 Primary osteoarthritis, unspecified shoulder: Secondary | ICD-10-CM | POA: Diagnosis not present

## 2011-08-07 DIAGNOSIS — R9389 Abnormal findings on diagnostic imaging of other specified body structures: Secondary | ICD-10-CM | POA: Diagnosis not present

## 2011-08-17 DIAGNOSIS — M24119 Other articular cartilage disorders, unspecified shoulder: Secondary | ICD-10-CM | POA: Diagnosis not present

## 2011-08-23 ENCOUNTER — Ambulatory Visit (INDEPENDENT_AMBULATORY_CARE_PROVIDER_SITE_OTHER): Payer: Medicare Other | Admitting: Internal Medicine

## 2011-08-23 ENCOUNTER — Encounter: Payer: Self-pay | Admitting: Internal Medicine

## 2011-08-23 VITALS — BP 130/80 | HR 88 | Temp 98.6°F | Resp 16 | Wt 109.0 lb

## 2011-08-23 DIAGNOSIS — F329 Major depressive disorder, single episode, unspecified: Secondary | ICD-10-CM | POA: Diagnosis not present

## 2011-08-23 DIAGNOSIS — F411 Generalized anxiety disorder: Secondary | ICD-10-CM | POA: Diagnosis not present

## 2011-08-23 DIAGNOSIS — M25519 Pain in unspecified shoulder: Secondary | ICD-10-CM

## 2011-08-23 DIAGNOSIS — R609 Edema, unspecified: Secondary | ICD-10-CM | POA: Diagnosis not present

## 2011-08-23 DIAGNOSIS — F3289 Other specified depressive episodes: Secondary | ICD-10-CM

## 2011-08-23 DIAGNOSIS — M25511 Pain in right shoulder: Secondary | ICD-10-CM

## 2011-08-23 NOTE — Assessment & Plan Note (Signed)
Resolved

## 2011-08-23 NOTE — Progress Notes (Signed)
Patient ID: Julie Montgomery, female   DOB: 01-04-1936, 76 y.o.   MRN: [id number]  Subjective:    Patient ID: Julie Montgomery, female    DOB: 06/15/1935, 76 y.o.   MRN: [id number]  HPI  F/u R shoulder pain post-op  F/u OA, GERD, anxiety  Review of Systems  Constitutional: Negative for chills, activity change, appetite change, fatigue and unexpected weight change.  HENT: Negative for congestion, mouth sores and sinus pressure.   Eyes: Negative for visual disturbance.  Respiratory: Negative for cough (mild) and chest tightness.   Gastrointestinal: Negative for nausea and abdominal pain.  Genitourinary: Negative for frequency, difficulty urinating and vaginal pain.  Musculoskeletal: Positive for arthralgias. Negative for back pain and gait problem.  Skin: Negative for pallor and rash.  Neurological: Negative for dizziness, tremors, weakness, numbness and headaches.  Psychiatric/Behavioral: Negative for suicidal ideas, confusion and sleep disturbance. The patient is not nervous/anxious.    Wt Readings from Last 3 Encounters:  08/23/11 109 lb (49.442 kg)  05/25/11 108 lb (48.988 kg)  04/03/11 103 lb (46.72 kg)   BP Readings from Last 3 Encounters:  08/23/11 130/80  05/25/11 120/74  04/05/11 110/70        Objective:   Physical Exam  Constitutional: She appears well-developed. No distress.       Thin   HENT:  Head: Normocephalic.  Right Ear: External ear normal.  Left Ear: External ear normal.  Nose: Nose normal.  Mouth/Throat: Oropharynx is clear and moist.  Eyes: Conjunctivae are normal. Pupils are equal, round, and reactive to light. Right eye exhibits no discharge. Left eye exhibits no discharge.  Neck: Normal range of motion. Neck supple. No JVD present. No tracheal deviation present. No thyromegaly present.  Cardiovascular: Normal rate, regular rhythm and normal heart sounds.   Pulmonary/Chest: No stridor. No respiratory distress. She has no wheezes.  Abdominal: Soft.  Bowel sounds are normal. She exhibits no distension and no mass. There is no tenderness. There is no rebound and no guarding.  Musculoskeletal: She exhibits tenderness (R shoulder is tender w/ROM). She exhibits no edema.  Lymphadenopathy:    She has no cervical adenopathy.  Neurological: She displays normal reflexes. No cranial nerve deficit. She exhibits normal muscle tone. Coordination normal.  Skin: No rash noted. No erythema.  Psychiatric: She has a normal mood and affect. Her behavior is normal. Judgment and thought content normal.    Lab Results  Component Value Date   WBC 8.8 04/03/2011   HGB 11.8* 04/03/2011   HCT 35.2* 04/03/2011   PLT 349.0 04/03/2011   GLUCOSE 102* 04/03/2011   CHOL 159 04/03/2011   TRIG 67.0 04/03/2011   HDL 50.50 04/03/2011   LDLDIRECT 129.5 02/06/2010   LDLCALC 95 04/03/2011   ALT 14 04/03/2011   AST 17 04/03/2011   NA 141 04/03/2011   K 4.2 04/03/2011   CL 106 04/03/2011   CREATININE 0.6 04/03/2011   BUN 21 04/03/2011   CO2 28 04/03/2011   TSH 2.44 04/03/2011           Assessment & Plan:

## 2011-08-23 NOTE — Assessment & Plan Note (Signed)
2012 rot cuff s/p surgery 10/12 Chronic ROM restriction - in PT

## 2011-08-23 NOTE — Assessment & Plan Note (Signed)
Better  

## 2011-08-24 DIAGNOSIS — M24119 Other articular cartilage disorders, unspecified shoulder: Secondary | ICD-10-CM | POA: Diagnosis not present

## 2011-08-24 DIAGNOSIS — M75120 Complete rotator cuff tear or rupture of unspecified shoulder, not specified as traumatic: Secondary | ICD-10-CM | POA: Diagnosis not present

## 2011-08-28 DIAGNOSIS — M24119 Other articular cartilage disorders, unspecified shoulder: Secondary | ICD-10-CM | POA: Diagnosis not present

## 2011-08-29 DIAGNOSIS — M24119 Other articular cartilage disorders, unspecified shoulder: Secondary | ICD-10-CM | POA: Diagnosis not present

## 2011-09-05 DIAGNOSIS — M24119 Other articular cartilage disorders, unspecified shoulder: Secondary | ICD-10-CM | POA: Diagnosis not present

## 2011-09-05 DIAGNOSIS — M75120 Complete rotator cuff tear or rupture of unspecified shoulder, not specified as traumatic: Secondary | ICD-10-CM | POA: Diagnosis not present

## 2011-09-11 DIAGNOSIS — M24119 Other articular cartilage disorders, unspecified shoulder: Secondary | ICD-10-CM | POA: Diagnosis not present

## 2011-09-11 DIAGNOSIS — M19019 Primary osteoarthritis, unspecified shoulder: Secondary | ICD-10-CM | POA: Diagnosis not present

## 2011-09-16 IMAGING — CT CT CHEST W/O CM
2 of 3 series · 15 of 36 positions shown, 18 images · IV contrast (Omnipaque 300)
Comparison: 06/15/2010 plain film.  Prior CT of 12/10/2005.

CLINICAL DATA: Chronic cough for 6 months.  Short of breath.

CT CHEST WITHOUT CONTRAST
TECHNIQUE: Multidetector CT imaging of the chest was performed
following the standard protocol without IV contrast.

[Series 2: chest routine with · axial · 0.64mm/px · z∈[-300,-30]mm · 12 of 64 slices shown, 15 images]
[im 5/64  mediastinal]
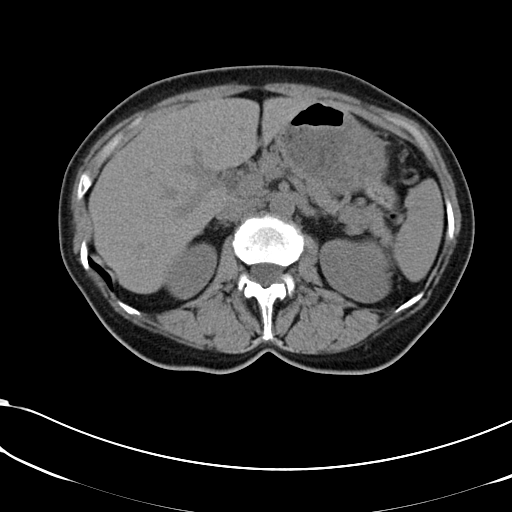
[im 5/64  lung]
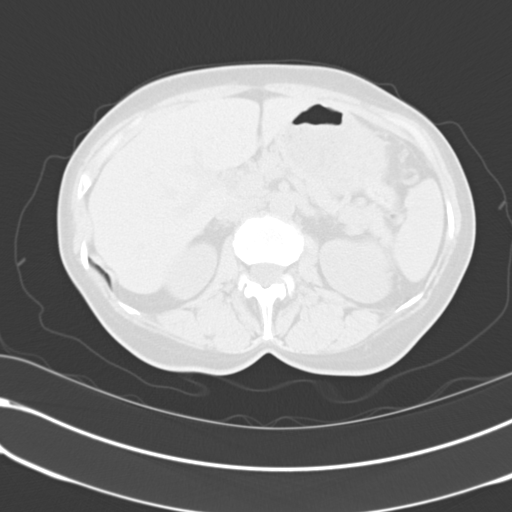
[im 10/64  lung]
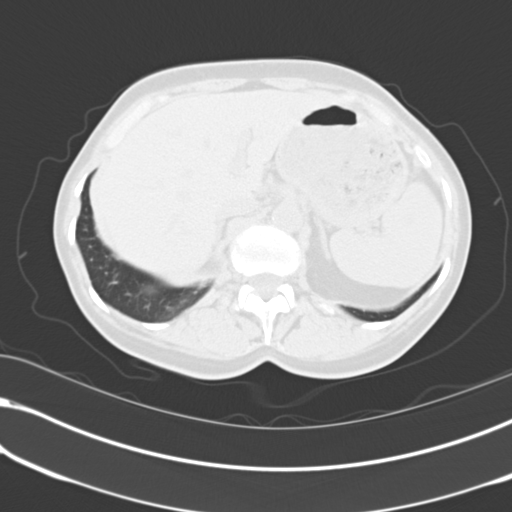
[im 15/64  lung]
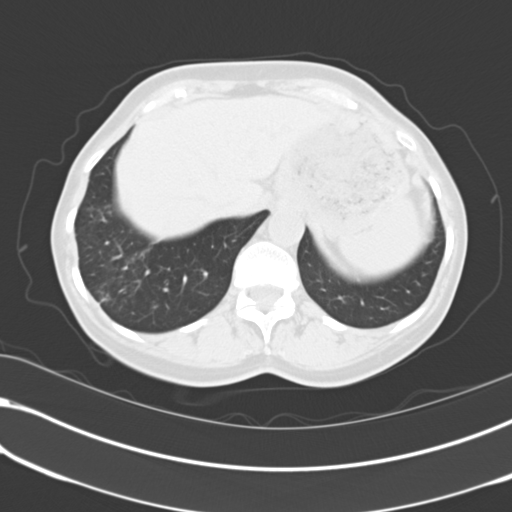
[im 19/64  lung]
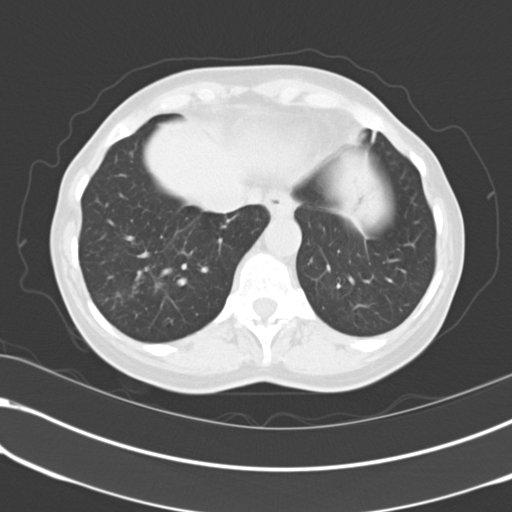
[im 24/64  mediastinal]
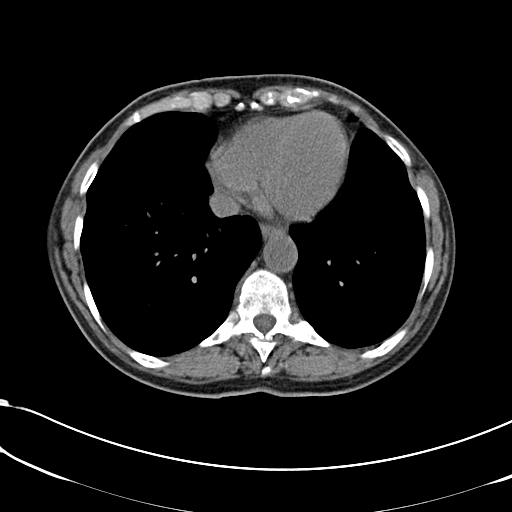
[im 24/64  lung]
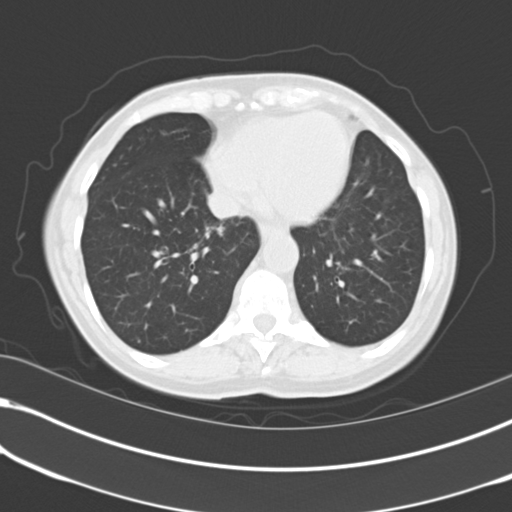
[im 29/64  lung]
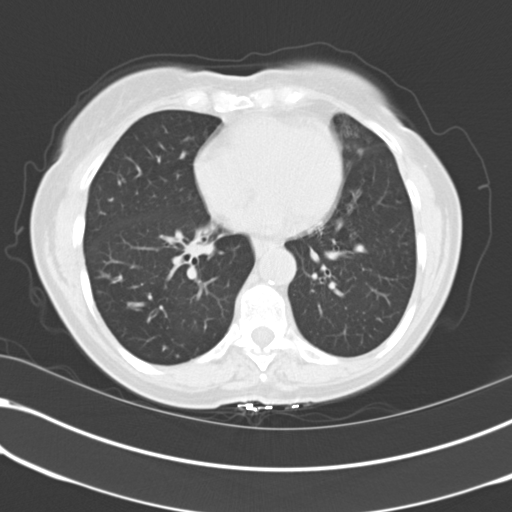
[im 36/64  lung]
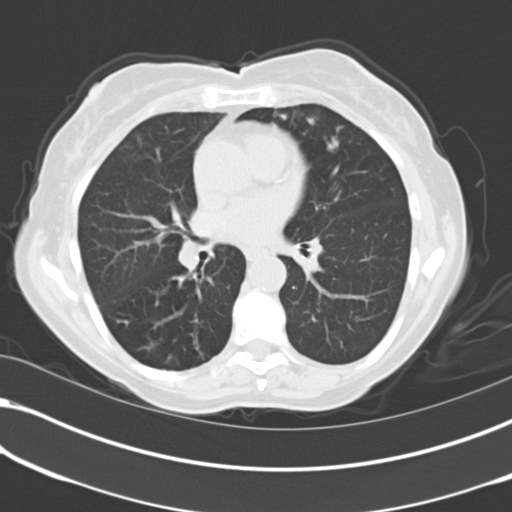
[im 40/64  lung]
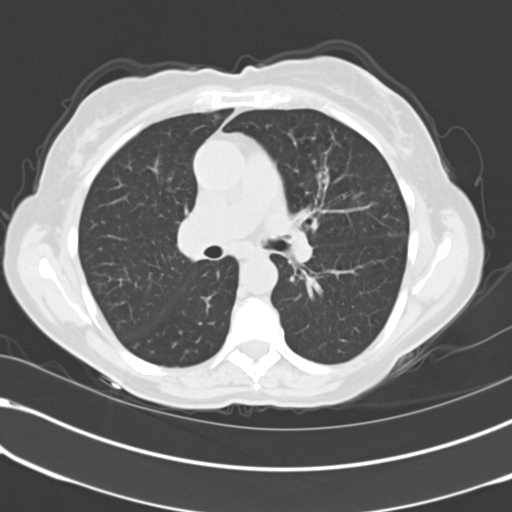
[im 45/64  mediastinal]
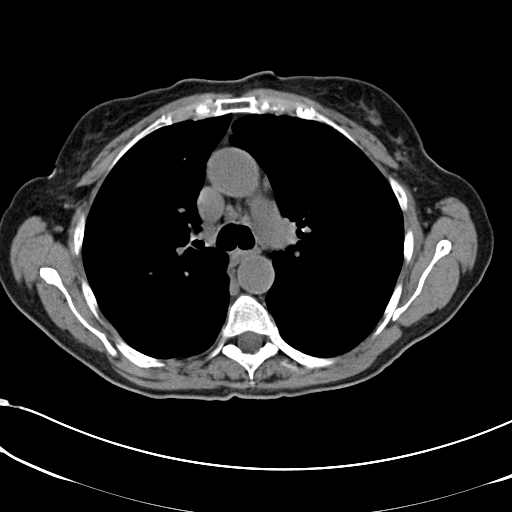
[im 45/64  lung]
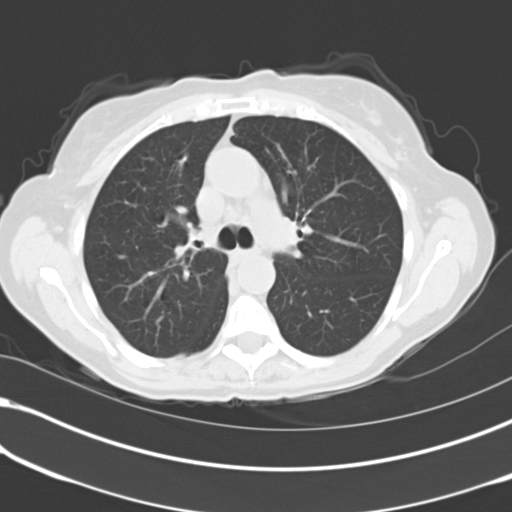
[im 50/64  lung]
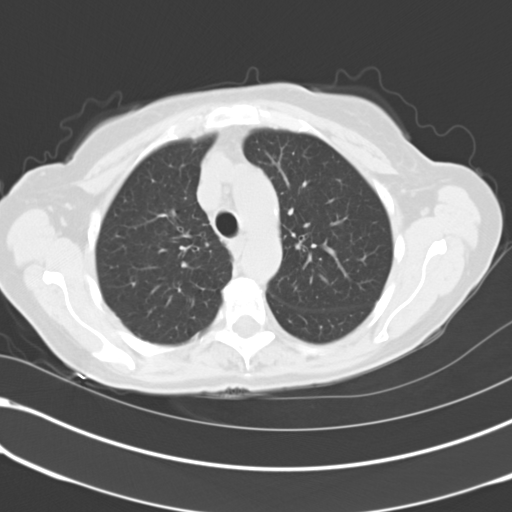
[im 54/64  lung]
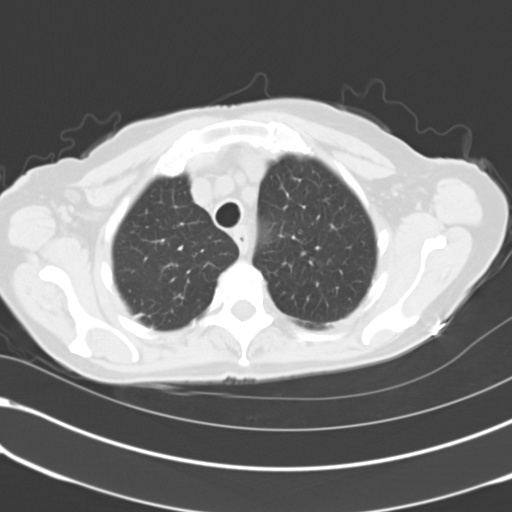
[im 59/64  lung]
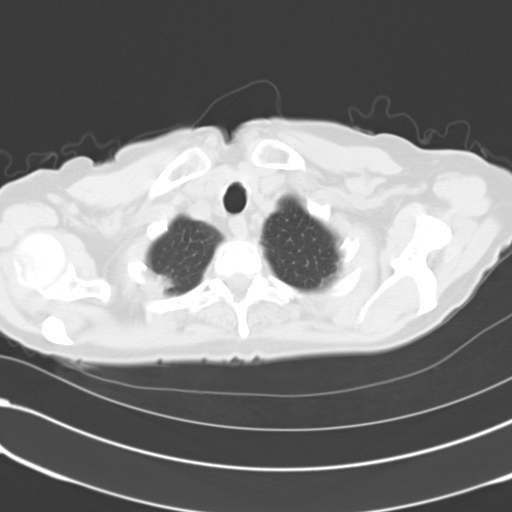

[Series 602: cor · coronal · 0.64mm/px · 3 of 91 slices shown]
[im 19/91  lung]
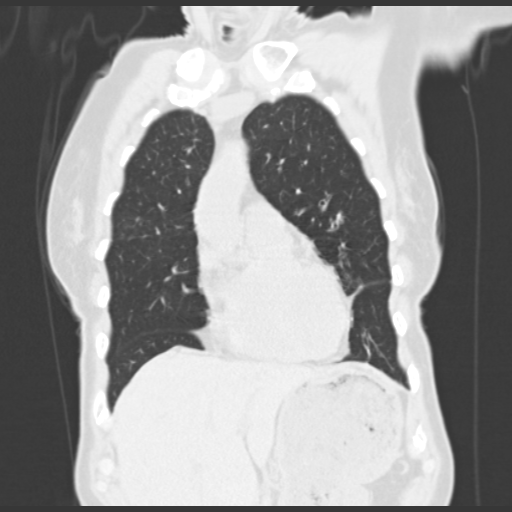
[im 37/91  lung]
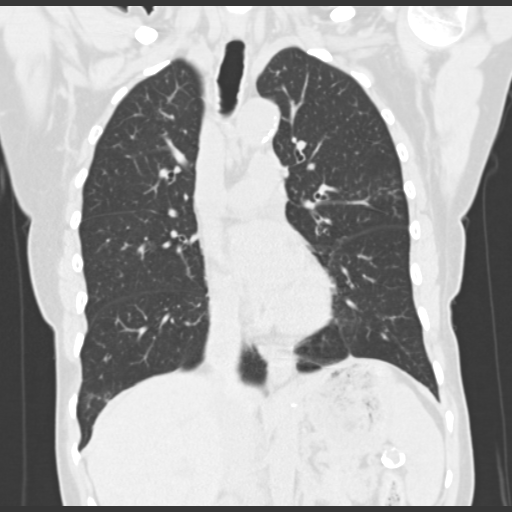
[im 55/91  lung]
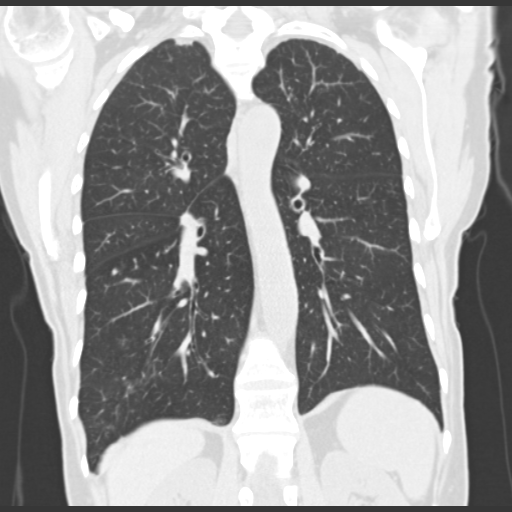

[15 of 36 positions shown; findings below may reference images not displayed]

FINDINGS: Lung windows demonstrate calcified right and middle lobe
lung nodule consistent with a granuloma.

Bronchial wall thickening is again identified.  This is new in the
right lower lobe, including on image 32 of series 3.  Persistent
but slightly increased in the lingula, including on image 27 of
series 3.  Reticular nodular opacities are similar in the lingula
and progressive in the right lower lobe.

Soft tissue windows demonstrate normal heart size without
pericardial or pleural effusion.  Calcified LAD coronary artery
atherosclerosis.  Increased number of small AP window/middle
mediastinal lymph nodes.  None pathologic by size criteria.  No
hilar adenopathy, given limitation of unenhanced CT.

Limited abdominal imaging demonstrates cholecystectomy.  Suspect a
splenic artery aneurysm.  1.6 cm on image 58.  Similar in size to
on the prior.
No acute osseous abnormality.
IMPRESSION: 1.  Persistent lingular and progressive right lower lobe findings
which are suspicious for atypical infection.  Mycobacterium avium
intracellulare is favored.
2.  Coronary artery atherosclerosis.
3.  Probable 1.6 cm splenic artery aneurysm, similar back to

## 2011-09-18 DIAGNOSIS — M779 Enthesopathy, unspecified: Secondary | ICD-10-CM | POA: Diagnosis not present

## 2011-09-18 DIAGNOSIS — M19019 Primary osteoarthritis, unspecified shoulder: Secondary | ICD-10-CM | POA: Diagnosis not present

## 2011-09-18 DIAGNOSIS — M24119 Other articular cartilage disorders, unspecified shoulder: Secondary | ICD-10-CM | POA: Diagnosis not present

## 2011-09-25 DIAGNOSIS — M779 Enthesopathy, unspecified: Secondary | ICD-10-CM | POA: Diagnosis not present

## 2011-09-25 DIAGNOSIS — M24119 Other articular cartilage disorders, unspecified shoulder: Secondary | ICD-10-CM | POA: Diagnosis not present

## 2011-09-25 DIAGNOSIS — M19019 Primary osteoarthritis, unspecified shoulder: Secondary | ICD-10-CM | POA: Diagnosis not present

## 2011-10-02 DIAGNOSIS — M24119 Other articular cartilage disorders, unspecified shoulder: Secondary | ICD-10-CM | POA: Diagnosis not present

## 2011-10-03 DIAGNOSIS — M24119 Other articular cartilage disorders, unspecified shoulder: Secondary | ICD-10-CM | POA: Diagnosis not present

## 2011-10-03 DIAGNOSIS — M779 Enthesopathy, unspecified: Secondary | ICD-10-CM | POA: Diagnosis not present

## 2011-10-04 DIAGNOSIS — H903 Sensorineural hearing loss, bilateral: Secondary | ICD-10-CM | POA: Diagnosis not present

## 2011-10-04 DIAGNOSIS — H6120 Impacted cerumen, unspecified ear: Secondary | ICD-10-CM | POA: Diagnosis not present

## 2011-11-12 DIAGNOSIS — J479 Bronchiectasis, uncomplicated: Secondary | ICD-10-CM | POA: Diagnosis not present

## 2011-12-24 DIAGNOSIS — H04129 Dry eye syndrome of unspecified lacrimal gland: Secondary | ICD-10-CM | POA: Diagnosis not present

## 2011-12-24 DIAGNOSIS — H5210 Myopia, unspecified eye: Secondary | ICD-10-CM | POA: Diagnosis not present

## 2011-12-24 DIAGNOSIS — H524 Presbyopia: Secondary | ICD-10-CM | POA: Diagnosis not present

## 2011-12-24 DIAGNOSIS — H259 Unspecified age-related cataract: Secondary | ICD-10-CM | POA: Diagnosis not present

## 2012-01-23 ENCOUNTER — Encounter: Payer: Self-pay | Admitting: Internal Medicine

## 2012-01-23 ENCOUNTER — Ambulatory Visit (INDEPENDENT_AMBULATORY_CARE_PROVIDER_SITE_OTHER): Payer: Medicare Other | Admitting: Internal Medicine

## 2012-01-23 VITALS — BP 120/64 | HR 73 | Temp 97.3°F | Resp 16 | Wt 113.2 lb

## 2012-01-23 DIAGNOSIS — D1779 Benign lipomatous neoplasm of other sites: Secondary | ICD-10-CM | POA: Diagnosis not present

## 2012-01-23 DIAGNOSIS — D1720 Benign lipomatous neoplasm of skin and subcutaneous tissue of unspecified limb: Secondary | ICD-10-CM | POA: Insufficient documentation

## 2012-01-23 DIAGNOSIS — E7800 Pure hypercholesterolemia, unspecified: Secondary | ICD-10-CM | POA: Diagnosis not present

## 2012-01-23 DIAGNOSIS — F329 Major depressive disorder, single episode, unspecified: Secondary | ICD-10-CM

## 2012-01-23 DIAGNOSIS — F411 Generalized anxiety disorder: Secondary | ICD-10-CM

## 2012-01-23 DIAGNOSIS — D649 Anemia, unspecified: Secondary | ICD-10-CM

## 2012-01-23 DIAGNOSIS — Z23 Encounter for immunization: Secondary | ICD-10-CM

## 2012-01-23 NOTE — Assessment & Plan Note (Signed)
Better  

## 2012-01-23 NOTE — Assessment & Plan Note (Signed)
Off Rx 

## 2012-01-23 NOTE — Progress Notes (Signed)
Subjective:    Patient ID: Julie Montgomery, female    DOB: 1935/11/11, 76 y.o.   MRN: [id number]  HPI  F/u R shoulder pain post-op - better  F/u OA, GERD, anxiety  Review of Systems  Constitutional: Negative for chills, activity change, appetite change, fatigue and unexpected weight change.  HENT: Negative for congestion, mouth sores and sinus pressure.   Eyes: Negative for visual disturbance.  Respiratory: Negative for cough (mild) and chest tightness.   Gastrointestinal: Negative for nausea and abdominal pain.  Genitourinary: Negative for frequency, difficulty urinating and vaginal pain.  Musculoskeletal: Positive for arthralgias. Negative for back pain and gait problem.  Skin: Negative for pallor and rash.  Neurological: Negative for dizziness, tremors, weakness, numbness and headaches.  Psychiatric/Behavioral: Negative for suicidal ideas, confusion and disturbed wake/sleep cycle. The patient is not nervous/anxious.    Wt Readings from Last 3 Encounters:  01/23/12 113 lb 4 oz (51.37 kg)  08/23/11 109 lb (49.442 kg)  05/25/11 108 lb (48.988 kg)   BP Readings from Last 3 Encounters:  01/23/12 120/64  08/23/11 130/80  05/25/11 120/74        Objective:   Physical Exam  Constitutional: She appears well-developed. No distress.       Thin   HENT:  Head: Normocephalic.  Right Ear: External ear normal.  Left Ear: External ear normal.  Nose: Nose normal.  Mouth/Throat: Oropharynx is clear and moist.  Eyes: Conjunctivae normal are normal. Pupils are equal, round, and reactive to light. Right eye exhibits no discharge. Left eye exhibits no discharge.  Neck: Normal range of motion. Neck supple. No JVD present. No tracheal deviation present. No thyromegaly present.  Cardiovascular: Normal rate, regular rhythm and normal heart sounds.   Pulmonary/Chest: No stridor. No respiratory distress. She has no wheezes.  Abdominal: Soft. Bowel sounds are normal. She exhibits no  distension and no mass. There is no tenderness. There is no rebound and no guarding.  Musculoskeletal: She exhibits tenderness (R shoulder is tender w/ROM). She exhibits no edema.  Lymphadenopathy:    She has no cervical adenopathy.  Neurological: She displays normal reflexes. No cranial nerve deficit. She exhibits normal muscle tone. Coordination normal.  Skin: No rash noted. No erythema.  Psychiatric: She has a normal mood and affect. Her behavior is normal. Judgment and thought content normal.  Distal foream dorsal mass B R>>L  Lab Results  Component Value Date   WBC 8.8 04/03/2011   HGB 11.8* 04/03/2011   HCT 35.2* 04/03/2011   PLT 349.0 04/03/2011   GLUCOSE 102* 04/03/2011   CHOL 159 04/03/2011   TRIG 67.0 04/03/2011   HDL 50.50 04/03/2011   LDLDIRECT 129.5 02/06/2010   LDLCALC 95 04/03/2011   ALT 14 04/03/2011   AST 17 04/03/2011   NA 141 04/03/2011   K 4.2 04/03/2011   CL 106 04/03/2011   CREATININE 0.6 04/03/2011   BUN 21 04/03/2011   CO2 28 04/03/2011   TSH 2.44 04/03/2011    Procedure Note :    Procedure :   Point of care (POC) sonography examination   Indication: R dist dors wrist swelling   Equipment used: Sonosite M-Turbo with HFL38x/13-6 MHz transducer linear probe. The images were stored in the unit and later transferred in storage.  The patient was placed in a decubitus position.  This study revealed a hypoechoic 10x6x1.5 cm lesion in dist dorsal forearm   Impression: c/w large dital dorsal wrist lipoma  Assessment & Plan:

## 2012-01-23 NOTE — Assessment & Plan Note (Signed)
  On diet  

## 2012-01-23 NOTE — Assessment & Plan Note (Signed)
9/13 chronic B dist dorsal forearms R>L [place]

## 2012-01-23 NOTE — Assessment & Plan Note (Signed)
Off rx 

## 2012-02-13 DIAGNOSIS — M24119 Other articular cartilage disorders, unspecified shoulder: Secondary | ICD-10-CM | POA: Diagnosis not present

## 2012-02-13 DIAGNOSIS — M75120 Complete rotator cuff tear or rupture of unspecified shoulder, not specified as traumatic: Secondary | ICD-10-CM | POA: Diagnosis not present

## 2012-02-13 DIAGNOSIS — M19019 Primary osteoarthritis, unspecified shoulder: Secondary | ICD-10-CM | POA: Diagnosis not present

## 2012-02-13 DIAGNOSIS — M779 Enthesopathy, unspecified: Secondary | ICD-10-CM | POA: Diagnosis not present

## 2012-02-29 DIAGNOSIS — L819 Disorder of pigmentation, unspecified: Secondary | ICD-10-CM | POA: Diagnosis not present

## 2012-02-29 DIAGNOSIS — L820 Inflamed seborrheic keratosis: Secondary | ICD-10-CM | POA: Diagnosis not present

## 2012-02-29 DIAGNOSIS — L821 Other seborrheic keratosis: Secondary | ICD-10-CM | POA: Diagnosis not present

## 2012-03-01 ENCOUNTER — Encounter (HOSPITAL_COMMUNITY): Payer: Self-pay | Admitting: Emergency Medicine

## 2012-03-01 ENCOUNTER — Emergency Department (HOSPITAL_COMMUNITY)
Admission: EM | Admit: 2012-03-01 | Discharge: 2012-03-01 | Disposition: A | Discharge status: Home / Self Care | Payer: Medicare Other | Source: Home / Self Care | Attending: Family Medicine | Admitting: Family Medicine

## 2012-03-01 DIAGNOSIS — J069 Acute upper respiratory infection, unspecified: Secondary | ICD-10-CM

## 2012-03-01 MED ORDER — IPRATROPIUM BROMIDE 0.06 % NA SOLN: 2.0000 | Freq: Four times a day (QID) | NASAL | Status: DC

## 2012-03-01 NOTE — ED Provider Notes (Signed)
History     CSN: [id number]  Arrival date & time 03/01/12  1157   First MD Initiated Contact with Patient 03/01/12 1159      Chief Complaint  Patient presents with  . URI    c/o of dry non productive cough, scratchy throat, sinus pressure and drainage/HA since thursday.    (Consider location/radiation/quality/duration/timing/severity/associated sxs/prior treatment) Patient is a 76 y.o. female presenting with URI. The history is provided by the patient.  URI The primary symptoms include cough. Primary symptoms do not include fever, sore throat, wheezing, nausea, vomiting or myalgias. The current episode started 3 to 5 days ago. This is a recurrent problem.  Symptoms associated with the illness include congestion and rhinorrhea.    Past Medical History  Diagnosis Date  . Osteopenia   . Anxiety   . Pneumonia     hx of 03/2008    Past Surgical History  Procedure Date  . Partial hysterectomy   . Breast lumpectomy     right  . Appendectomy   . Cesarean section     Family History  Problem Relation Age of Onset  . Diabetes Sister   . Cancer Sister     lung ca  . Coronary artery disease Brother   . Dementia Brother   . Cancer Brother     lung ca    History  Substance Use Topics  . Smoking status: Never Smoker   . Smokeless tobacco: Never Used  . Alcohol Use: No    OB History    Grav Para Term Preterm Abortions TAB SAB Ect Mult Living                  Review of Systems  Constitutional: Negative for fever.  HENT: Positive for congestion, rhinorrhea and postnasal drip. Negative for sore throat.   Respiratory: Positive for cough. Negative for shortness of breath and wheezing.   Gastrointestinal: Negative for nausea and vomiting.  Musculoskeletal: Negative for myalgias.    Allergies  Fluoxetine hcl; Lovastatin; Mometasone furo-formoterol fum; and Sulfa antibiotics  Home Medications   Current Outpatient Rx  Name Route Sig Dispense Refill  . GUAIFENESIN  ER 600 MG PO TB12 Oral Take 1,200 mg by mouth daily.    Marland Kitchen VITAMIN D3 1000 UNITS PO CAPS Oral Take by mouth daily. Take total of 2 daily     . CYCLOBENZAPRINE HCL 5 MG PO TABS Oral Take 5 mg by mouth 3 (three) times a week.    . IPRATROPIUM BROMIDE 0.06 % NA SOLN Nasal Place 2 sprays into the nose 4 (four) times daily. 15 mL 1  . LOPERAMIDE HCL 2 MG PO TABS Oral Take 2 mg by mouth 4 (four) times daily as needed.    Marland Kitchen LORATADINE 10 MG PO TABS Oral Take 10 mg by mouth daily.    . MOMETASONE FUROATE 50 MCG/ACT NA SUSP Nasal Place 1 spray into the nose daily. 17 g 3  . ICAPS PO Oral Take by mouth daily.    Marland Kitchen ALIGN 4 MG PO CAPS Oral Take 1 capsule by mouth daily. 15 capsule 1  . RANITIDINE HCL 150 MG PO TABS Oral Take 1 tablet (150 mg total) by mouth 2 (two) times daily. 180 tablet 3    BP 148/71  Pulse 66  Temp 99.1 F (37.3 C) (Oral)  Resp 18  SpO2 95%  Physical Exam  Nursing note and vitals reviewed. Constitutional: She is oriented to person, place, and time. She appears well-developed and  well-nourished.  HENT:  Head: Normocephalic.  Right Ear: External ear normal.  Left Ear: External ear normal.  Nose: Mucosal edema and rhinorrhea present.  Mouth/Throat: Oropharynx is clear and moist.  Eyes: Conjunctivae normal are normal. Pupils are equal, round, and reactive to light.  Neck: Normal range of motion. Neck supple.  Cardiovascular: Normal rate, normal heart sounds and intact distal pulses.   Pulmonary/Chest: She has decreased breath sounds. She has no wheezes. She has no rhonchi. She has no rales.  Neurological: She is alert and oriented to person, place, and time.  Skin: Skin is warm and dry.    ED Course  Procedures (including critical care time)  Labs Reviewed - No data to display No results found.   1. URI (upper respiratory infection)       MDM          Linna Hoff, MD 03/04/12 [phone]

## 2012-03-01 NOTE — ED Notes (Signed)
Pt states that she has had a dry non productive cough , nasal drainage and head congestion with HA.   Scratchy throat. Pt states that she has been feeling feverish. Symptoms have gotten gradually worse.  Hx of brochioectasis and hx lung damage.  Pt has used mucinex claritin and tylenol with mild relief.  Last dose of mucinex was early this a.m.

## 2012-03-19 ENCOUNTER — Encounter: Payer: Self-pay | Admitting: Internal Medicine

## 2012-03-19 ENCOUNTER — Other Ambulatory Visit (INDEPENDENT_AMBULATORY_CARE_PROVIDER_SITE_OTHER): Payer: Medicare Other

## 2012-03-19 ENCOUNTER — Ambulatory Visit (INDEPENDENT_AMBULATORY_CARE_PROVIDER_SITE_OTHER): Payer: Medicare Other | Admitting: Internal Medicine

## 2012-03-19 VITALS — BP 110/56 | HR 84 | Temp 97.3°F | Resp 18 | Ht 62.5 in | Wt 110.5 lb

## 2012-03-19 DIAGNOSIS — M545 Low back pain, unspecified: Secondary | ICD-10-CM

## 2012-03-19 DIAGNOSIS — R300 Dysuria: Secondary | ICD-10-CM

## 2012-03-19 MED ORDER — CIPROFLOXACIN HCL 250 MG PO TABS: 250.0000 mg | ORAL_TABLET | Freq: Two times a day (BID) | ORAL | Status: DC

## 2012-03-19 NOTE — Assessment & Plan Note (Signed)
UA Cipro if positive

## 2012-03-19 NOTE — Progress Notes (Signed)
  C/o LBP x 2 d, better today; urgency and frequency  Patient ID: Julie Montgomery, female   DOB: 1935/11/26, 76 y.o.   MRN: [id number]  Subjective:    Patient ID: Julie Montgomery, female    DOB: 10-26-1935, 76 y.o.   MRN: [id number]   F/u OA, GERD, anxiety  Review of Systems  Constitutional: Negative for chills, activity change, appetite change, fatigue and unexpected weight change.  HENT: Negative for congestion, mouth sores and sinus pressure.   Eyes: Negative for visual disturbance.  Respiratory: Negative for cough (mild) and chest tightness.   Gastrointestinal: Negative for nausea and abdominal pain.  Genitourinary: Negative for frequency, difficulty urinating and vaginal pain.  Musculoskeletal: Positive for arthralgias. Negative for back pain and gait problem.  Skin: Negative for pallor and rash.  Neurological: Negative for dizziness, tremors, weakness, numbness and headaches.  Psychiatric/Behavioral: Negative for suicidal ideas, confusion and sleep disturbance. The patient is not nervous/anxious.    Wt Readings from Last 3 Encounters:  08/23/11 109 lb (49.442 kg)  05/25/11 108 lb (48.988 kg)  04/03/11 103 lb (46.72 kg)   BP Readings from Last 3 Encounters:  08/23/11 130/80  05/25/11 120/74  04/05/11 110/70        Objective:   Physical Exam  Constitutional: She appears well-developed. No distress.       Thin   HENT:  Head: Normocephalic.  Right Ear: External ear normal.  Left Ear: External ear normal.  Nose: Nose normal.  Mouth/Throat: Oropharynx is clear and moist.  Eyes: Conjunctivae are normal. Pupils are equal, round, and reactive to light. Right eye exhibits no discharge. Left eye exhibits no discharge.  Neck: Normal range of motion. Neck supple. No JVD present. No tracheal deviation present. No thyromegaly present.  Cardiovascular: Normal rate, regular rhythm and normal heart sounds.   Pulmonary/Chest: No stridor. No respiratory distress. She has no wheezes.   Abdominal: Soft. Bowel sounds are normal. She exhibits no distension and no mass. There is no tenderness. There is no rebound and no guarding.  Musculoskeletal: She exhibits tenderness (R shoulder is tender w/ROM). She exhibits no edema.  Lymphadenopathy:    She has no cervical adenopathy.  Neurological: She displays normal reflexes. No cranial nerve deficit. She exhibits normal muscle tone. Coordination normal.  Skin: No rash noted. No erythema.  Psychiatric: She has a normal mood and affect. Her behavior is normal. Judgment and thought content normal.             Assessment & Plan:

## 2012-03-19 NOTE — Assessment & Plan Note (Signed)
11/13 - ?UTI UA

## 2012-03-20 ENCOUNTER — Telehealth: Payer: Self-pay | Admitting: Internal Medicine

## 2012-03-20 LAB — URINALYSIS, ROUTINE W REFLEX MICROSCOPIC
Bilirubin Urine: NEGATIVE
Hgb urine dipstick: NEGATIVE
Nitrite: NEGATIVE
Total Protein, Urine: NEGATIVE

## 2012-03-20 NOTE — Telephone Encounter (Signed)
Take cipro if symptoms Thx

## 2012-03-21 NOTE — Telephone Encounter (Signed)
Done

## 2012-03-21 NOTE — Telephone Encounter (Signed)
pls mail labs Thx

## 2012-03-21 NOTE — Telephone Encounter (Signed)
Pt informed- she wants lab results.

## 2012-04-17 ENCOUNTER — Ambulatory Visit (INDEPENDENT_AMBULATORY_CARE_PROVIDER_SITE_OTHER): Payer: Medicare Other | Admitting: Psychology

## 2012-04-17 DIAGNOSIS — F4322 Adjustment disorder with anxiety: Secondary | ICD-10-CM

## 2012-05-01 ENCOUNTER — Ambulatory Visit (INDEPENDENT_AMBULATORY_CARE_PROVIDER_SITE_OTHER): Payer: Medicare Other | Admitting: Psychology

## 2012-05-01 DIAGNOSIS — F4322 Adjustment disorder with anxiety: Secondary | ICD-10-CM

## 2012-05-07 ENCOUNTER — Ambulatory Visit (INDEPENDENT_AMBULATORY_CARE_PROVIDER_SITE_OTHER): Payer: Medicare Other | Admitting: Internal Medicine

## 2012-05-07 ENCOUNTER — Other Ambulatory Visit (INDEPENDENT_AMBULATORY_CARE_PROVIDER_SITE_OTHER): Payer: Medicare Other

## 2012-05-07 ENCOUNTER — Encounter: Payer: Self-pay | Admitting: Internal Medicine

## 2012-05-07 VITALS — BP 112/72 | HR 64 | Temp 97.7°F | Resp 16 | Ht 62.0 in | Wt 110.5 lb

## 2012-05-07 DIAGNOSIS — F3289 Other specified depressive episodes: Secondary | ICD-10-CM

## 2012-05-07 DIAGNOSIS — D649 Anemia, unspecified: Secondary | ICD-10-CM

## 2012-05-07 DIAGNOSIS — Z136 Encounter for screening for cardiovascular disorders: Secondary | ICD-10-CM

## 2012-05-07 DIAGNOSIS — E7800 Pure hypercholesterolemia, unspecified: Secondary | ICD-10-CM | POA: Diagnosis not present

## 2012-05-07 DIAGNOSIS — F411 Generalized anxiety disorder: Secondary | ICD-10-CM | POA: Diagnosis not present

## 2012-05-07 DIAGNOSIS — E785 Hyperlipidemia, unspecified: Secondary | ICD-10-CM

## 2012-05-07 DIAGNOSIS — Z23 Encounter for immunization: Secondary | ICD-10-CM | POA: Diagnosis not present

## 2012-05-07 DIAGNOSIS — F329 Major depressive disorder, single episode, unspecified: Secondary | ICD-10-CM | POA: Diagnosis not present

## 2012-05-07 DIAGNOSIS — Z0000 Encounter for general adult medical examination without abnormal findings: Secondary | ICD-10-CM

## 2012-05-07 DIAGNOSIS — M545 Low back pain, unspecified: Secondary | ICD-10-CM

## 2012-05-07 LAB — CBC WITH DIFFERENTIAL/PLATELET
Basophils Relative: 0.5 % (ref 0.0–3.0)
Eosinophils Absolute: 0.2 10*3/uL (ref 0.0–0.7)
Hemoglobin: 12.8 g/dL (ref 12.0–15.0)
MCHC: 34 g/dL (ref 30.0–36.0)
MCV: 91.7 fl (ref 78.0–100.0)
Monocytes Absolute: 0.4 10*3/uL (ref 0.1–1.0)
Neutro Abs: 4.1 10*3/uL (ref 1.4–7.7)
RBC: 4.11 Mil/uL (ref 3.87–5.11)

## 2012-05-07 LAB — URINALYSIS, ROUTINE W REFLEX MICROSCOPIC
Nitrite: NEGATIVE
Specific Gravity, Urine: 1.025 (ref 1.000–1.030)
Urine Glucose: NEGATIVE
Urobilinogen, UA: 0.2 (ref 0.0–1.0)

## 2012-05-07 LAB — LIPID PANEL
HDL: 50.5 mg/dL (ref >39.00)
Total CHOL/HDL Ratio: 4

## 2012-05-07 LAB — HEPATIC FUNCTION PANEL
ALT: 14 U/L (ref 0–35)
Alkaline Phosphatase: 91 U/L (ref 39–117)
Bilirubin, Direct: 0.1 mg/dL (ref 0.0–0.3)
Total Protein: 6.6 g/dL (ref 6.0–8.3)

## 2012-05-07 LAB — BASIC METABOLIC PANEL
CO2: 31 mEq/L (ref 19–32)
Calcium: 9.4 mg/dL (ref 8.4–10.5)
Sodium: 140 mEq/L (ref 135–145)

## 2012-05-07 NOTE — Assessment & Plan Note (Signed)
Doing well 

## 2012-05-07 NOTE — Progress Notes (Signed)
     Subjective:    Patient ID: Julie Montgomery, female    DOB: 09-13-1935, 77 y.o.   MRN: [id number]  The patient is here for a wellness exam. The patient has been doing well overall without major physical or psychological issues going on lately.  F/u OA, GERD, anxiety  Review of Systems  Constitutional: Negative for chills, activity change, appetite change, fatigue and unexpected weight change.  HENT: Negative for congestion, mouth sores and sinus pressure.   Eyes: Negative for visual disturbance.  Respiratory: Negative for cough (mild) and chest tightness.   Gastrointestinal: Negative for nausea and abdominal pain.  Genitourinary: Negative for frequency, difficulty urinating and vaginal pain.  Musculoskeletal: Positive for arthralgias. Negative for back pain and gait problem.  Skin: Negative for pallor and rash.  Neurological: Negative for dizziness, tremors, weakness, numbness and headaches.  Psychiatric/Behavioral: Negative for suicidal ideas, confusion and sleep disturbance. The patient is not nervous/anxious.    Wt Readings from Last 3 Encounters:  08/23/11 109 lb (49.442 kg)  05/25/11 108 lb (48.988 kg)  04/03/11 103 lb (46.72 kg)   BP Readings from Last 3 Encounters:  08/23/11 130/80  05/25/11 120/74  04/05/11 110/70        Objective:   Physical Exam  Constitutional: She appears well-developed. No distress.       Thin   HENT:  Head: Normocephalic.  Right Ear: External ear normal.  Left Ear: External ear normal.  Nose: Nose normal.  Mouth/Throat: Oropharynx is clear and moist.  Eyes: Conjunctivae are normal. Pupils are equal, round, and reactive to light. Right eye exhibits no discharge. Left eye exhibits no discharge.  Neck: Normal range of motion. Neck supple. No JVD present. No tracheal deviation present. No thyromegaly present.  Cardiovascular: Normal rate, regular rhythm and normal heart sounds.   Pulmonary/Chest: No stridor. No respiratory distress. She  has no wheezes.  Abdominal: Soft. Bowel sounds are normal. She exhibits no distension and no mass. There is no tenderness. There is no rebound and no guarding.  Musculoskeletal: She exhibits tenderness (R shoulder is tender w/ROM). She exhibits no edema.  Lymphadenopathy:    She has no cervical adenopathy.  Neurological: She displays normal reflexes. No cranial nerve deficit. She exhibits normal muscle tone. Coordination normal.  Skin: No rash noted. No erythema.  Psychiatric: She has a normal mood and affect. Her behavior is normal. Judgment and thought content normal.   Lab Results  Component Value Date   WBC 8.8 04/03/2011   HGB 11.8* 04/03/2011   HCT 35.2* 04/03/2011   PLT 349.0 04/03/2011   GLUCOSE 102* 04/03/2011   CHOL 159 04/03/2011   TRIG 67.0 04/03/2011   HDL 50.50 04/03/2011   LDLDIRECT 129.5 02/06/2010   LDLCALC 95 04/03/2011   ALT 14 04/03/2011   AST 17 04/03/2011   NA 141 04/03/2011   K 4.2 04/03/2011   CL 106 04/03/2011   CREATININE 0.6 04/03/2011   BUN 21 04/03/2011   CO2 28 04/03/2011   TSH 2.44 04/03/2011             Assessment & Plan:

## 2012-05-07 NOTE — Assessment & Plan Note (Signed)
  On diet  

## 2012-05-07 NOTE — Assessment & Plan Note (Signed)
The patient is here for annual Medicare wellness examination and management of other chronic and acute problems.   The risk factors are reflected in the social history.  The roster of all physicians providing medical care to patient - is listed in the Snapshot section of the chart.  Activities of daily living:  The patient is 100% inedpendent in all ADLs: dressing, toileting, feeding as well as independent mobility  Home safety : The patient has smoke detectors in the home. They wear seatbelts.No firearms at home ( firearms are present in the home, kept in a safe fashion). There is no violence in the home.   There is no risks for hepatitis, STDs or HIV. There is no   history of blood transfusion. They have no travel history to infectious disease endemic areas of the world.  The patient has (has not) seen their dentist in the last six month. They have (not) seen their eye doctor in the last year. They deny (admit to) any hearing difficulty and have not had audiologic testing in the last year.  They do not  have excessive sun exposure. Discussed the need for sun protection: hats, long sleeves and use of sunscreen if there is significant sun exposure.   Diet: the importance of a healthy diet is discussed. They do have a healthy (unhealthy-high fat/fast food) diet.  The patient has a regular exercise program: no - in pain post-op.  The benefits of regular aerobic exercise were discussed.  Depression screen: there are no signs or vegative symptoms of depression- irritability, change in appetite, anhedonia, sadness/tearfullness.  Cognitive assessment: the patient manages all their financial and personal affairs and is actively engaged. They could relate day,date,year and events; recalled 3/3 objects at 3 minutes; performed clock-face test normally.  The following portions of the patient's history were reviewed and updated as appropriate: allergies, current medications, past family history, past  medical history,  past surgical history, past social history  and problem list.  Vision, hearing, body mass index were assessed and reviewed.   During the course of the visit the patient was educated and counseled about appropriate screening and preventive services including : fall prevention , diabetes screening, nutrition counseling, colorectal cancer screening, and recommended immunizations.  

## 2012-05-22 ENCOUNTER — Ambulatory Visit (INDEPENDENT_AMBULATORY_CARE_PROVIDER_SITE_OTHER): Payer: Medicare Other | Admitting: Psychology

## 2012-05-22 DIAGNOSIS — F4322 Adjustment disorder with anxiety: Secondary | ICD-10-CM | POA: Diagnosis not present

## 2012-06-05 ENCOUNTER — Ambulatory Visit (INDEPENDENT_AMBULATORY_CARE_PROVIDER_SITE_OTHER): Payer: Medicare Other | Admitting: Psychology

## 2012-06-05 DIAGNOSIS — F4322 Adjustment disorder with anxiety: Secondary | ICD-10-CM

## 2012-06-19 ENCOUNTER — Ambulatory Visit (INDEPENDENT_AMBULATORY_CARE_PROVIDER_SITE_OTHER): Payer: Medicare Other | Admitting: Psychology

## 2012-06-19 DIAGNOSIS — F4322 Adjustment disorder with anxiety: Secondary | ICD-10-CM

## 2012-07-10 ENCOUNTER — Ambulatory Visit (INDEPENDENT_AMBULATORY_CARE_PROVIDER_SITE_OTHER): Payer: Medicare Other | Admitting: Psychology

## 2012-07-10 DIAGNOSIS — F4322 Adjustment disorder with anxiety: Secondary | ICD-10-CM | POA: Diagnosis not present

## 2012-07-23 ENCOUNTER — Other Ambulatory Visit: Payer: Self-pay

## 2012-07-23 DIAGNOSIS — Z1231 Encounter for screening mammogram for malignant neoplasm of breast: Secondary | ICD-10-CM

## 2012-07-23 DIAGNOSIS — Z9889 Other specified postprocedural states: Secondary | ICD-10-CM

## 2012-07-24 ENCOUNTER — Ambulatory Visit (INDEPENDENT_AMBULATORY_CARE_PROVIDER_SITE_OTHER): Payer: Medicare Other | Admitting: Psychology

## 2012-07-24 DIAGNOSIS — L259 Unspecified contact dermatitis, unspecified cause: Secondary | ICD-10-CM | POA: Diagnosis not present

## 2012-07-24 DIAGNOSIS — F4322 Adjustment disorder with anxiety: Secondary | ICD-10-CM | POA: Diagnosis not present

## 2012-07-24 DIAGNOSIS — L821 Other seborrheic keratosis: Secondary | ICD-10-CM | POA: Diagnosis not present

## 2012-08-12 DIAGNOSIS — J479 Bronchiectasis, uncomplicated: Secondary | ICD-10-CM | POA: Diagnosis not present

## 2012-08-14 ENCOUNTER — Ambulatory Visit: Payer: Medicare Other | Admitting: Psychology

## 2012-08-28 ENCOUNTER — Telehealth: Payer: Self-pay | Admitting: Internal Medicine

## 2012-08-28 NOTE — Telephone Encounter (Signed)
Patient Information:  Caller Name: Niaya  Phone: [phone]  Patient: Jacob Moores  Gender: Female  DOB: Dec 19, 1935  Age: 77 Years  PCP: Plotnikov, Alex (Adults only)  Office Follow Up:  Does the office need to follow up with this patient?: No  Instructions For The Office: N/A  RN Note:  Offered to schedule with Dr. Yetta Barre for this afternoon, but patient requested not to see this physician. She requested Dr. Felicity Coyer if Dr. Posey Rea was unavailable.  Symptoms  Reason For Call & Symptoms: Reports productive cough with clear mucus. Patient was told by Pulmonologist that if she develops a cough to get checked quickly due to her bronchiectasis. Reports sore throat/scratchy throat.  Reviewed Health History In EMR: Yes  Reviewed Medications In EMR: Yes  Reviewed Allergies In EMR: Yes  Reviewed Surgeries / Procedures: Yes  Date of Onset of Symptoms: 08/24/2012  Treatments Tried: Claritin, mucinex  Treatments Tried Worked: Yes  Guideline(s) Used:  Cough  Disposition Per Guideline:   See Today in Office  Reason For Disposition Reached:   Known COPD or other severe lung disease (i.e., bronchiectasis, cystic fibrosis, lung surgery) and worsening symptoms (i.e., increased sputum purulence or amount, increased breathing difficulty)  Advice Given:  N/A  Patient Will Follow Care Advice:  YES  Appointment Scheduled:  08/29/2012 13:45:00 Appointment Scheduled Provider:  Rene Paci (Adults only)

## 2012-08-29 ENCOUNTER — Encounter: Payer: Self-pay | Admitting: Family Medicine

## 2012-08-29 ENCOUNTER — Ambulatory Visit (INDEPENDENT_AMBULATORY_CARE_PROVIDER_SITE_OTHER): Payer: Medicare Other | Admitting: Family Medicine

## 2012-08-29 ENCOUNTER — Ambulatory Visit: Payer: Self-pay | Admitting: Internal Medicine

## 2012-08-29 ENCOUNTER — Ambulatory Visit
Admission: RE | Admit: 2012-08-29 | Discharge: 2012-08-29 | Disposition: A | Discharge status: Home / Self Care | Payer: Medicare Other | Source: Ambulatory Visit

## 2012-08-29 VITALS — BP 104/64 | Temp 98.7°F | Wt 110.0 lb

## 2012-08-29 DIAGNOSIS — J309 Allergic rhinitis, unspecified: Secondary | ICD-10-CM

## 2012-08-29 DIAGNOSIS — Z1231 Encounter for screening mammogram for malignant neoplasm of breast: Secondary | ICD-10-CM | POA: Diagnosis not present

## 2012-08-29 DIAGNOSIS — Z9889 Other specified postprocedural states: Secondary | ICD-10-CM

## 2012-08-29 DIAGNOSIS — J302 Other seasonal allergic rhinitis: Secondary | ICD-10-CM

## 2012-08-29 MED ORDER — FLUTICASONE PROPIONATE 50 MCG/ACT NA SUSP: 1.0000 | Freq: Every day | NASAL | Status: DC

## 2012-08-29 NOTE — Progress Notes (Signed)
Chief Complaint  Patient presents with  . Cough    sore throat, sinus pressure, runny/stuffy nose     HPI:  -started: 4 days ago -symptoms:nasal congestion, sore throat, cough, sinus pressure, itchy eyes, sneezing -denies:fever, SOB, NVD, tooth pain, strep or mono exposure -has tried: salt water gargles, saline nasal spray, musinex, claritin last 2 days -sick contacts: no sick contacts -Hx of: allergies -reports does not have an allergy to steroids - just sensitive to a lot of medications   ROS: See pertinent positives and negatives per HPI.  Past Medical History  Diagnosis Date  . Osteopenia   . Anxiety   . Pneumonia     hx of 03/2008    Family History  Problem Relation Age of Onset  . Diabetes Sister   . Cancer Sister     lung ca  . Coronary artery disease Brother   . Dementia Brother   . Cancer Brother     lung ca    History   Social History  . Marital Status: Married    Spouse Name: N/A    Number of Children: N/A  . Years of Education: N/A   Occupational History  . Substitute Teacher    Social History Main Topics  . Smoking status: Never Smoker   . Smokeless tobacco: Never Used  . Alcohol Use: No  . Drug Use: No  . Sexually Active: None   Other Topics Concern  . None   Social History Narrative   Married, Retired, she was looking after her GGD, now in court battle    Current outpatient prescriptions:Cholecalciferol (VITAMIN D3) 1000 UNITS CAPS, Take by mouth daily. Take total of 2 daily , Disp: , Rfl: ;  guaiFENesin (MUCINEX) 600 MG 12 hr tablet, Take 1,200 mg by mouth daily., Disp: , Rfl: ;  ipratropium (ATROVENT) 0.06 % nasal spray, Place 2 sprays into the nose 4 (four) times daily., Disp: 15 mL, Rfl: 1;  mometasone (NASONEX) 50 MCG/ACT nasal spray, Place 1 spray into the nose daily., Disp: 17 g, Rfl: 3 Multiple Vitamins-Minerals (ICAPS PO), Take by mouth daily., Disp: , Rfl: ;  fluticasone (FLONASE) 50 MCG/ACT nasal spray, Place 1 spray into the  nose daily., Disp: 16 g, Rfl: 1;  loperamide (IMODIUM A-D) 2 MG tablet, Take 2 mg by mouth 4 (four) times daily as needed., Disp: , Rfl: ;  loratadine (CLARITIN) 10 MG tablet, Take 10 mg by mouth daily., Disp: , Rfl:  ranitidine (ZANTAC) 150 MG tablet, Take 1 tablet (150 mg total) by mouth 2 (two) times daily., Disp: 180 tablet, Rfl: 3  EXAM:  Filed Vitals:   08/29/12 1624  BP: 104/64  Temp: 98.7 F (37.1 C)    Body mass index is 20.11 kg/(m^2).  GENERAL: vitals reviewed and listed above, alert, oriented, appears well hydrated and in no acute distress  HEENT: atraumatic, conjunttiva clear, no obvious abnormalities on inspection of external nose and ears, normal appearance of ear canals and TMs, clear nasal congestion, pale boggy turbinates mild post oropharyngeal erythema with PND, no tonsillar edema or exudate, no sinus TTP  NECK: no obvious masses on inspection  LUNGS: clear to auscultation bilaterally, no wheezes, rales or rhonchi, good air movement  CV: HRRR, no peripheral edema  MS: moves all extremities without noticeable abnormality  PSYCH: pleasant and cooperative, no obvious depression or anxiety  ASSESSMENT AND PLAN:  Discussed the following assessment and plan:  Seasonal allergies - Plan: fluticasone (FLONASE) 50 MCG/ACT nasal spray  -looks  like allergies from exam - advised daily claritin and nasal steroid -follow up with pcp in 4 weeks or sooner if worsening  -Patient advised to return or notify a doctor immediately if symptoms worsen or persist or new concerns arise.  There are no Patient Instructions on file for this visit.   Kriste Basque R.

## 2012-09-01 ENCOUNTER — Other Ambulatory Visit: Payer: Self-pay | Admitting: Internal Medicine

## 2012-09-01 DIAGNOSIS — R928 Other abnormal and inconclusive findings on diagnostic imaging of breast: Secondary | ICD-10-CM

## 2012-09-03 ENCOUNTER — Telehealth: Payer: Self-pay | Admitting: *Deleted

## 2012-09-03 NOTE — Telephone Encounter (Signed)
Pt states she was called by the breast center to come repeat a mammogram. She wants to know why and the results of her last mammogram from 08/29/12. Please advise.

## 2012-09-03 NOTE — Telephone Encounter (Signed)
Further evaluation is suggested for calcifications in the left  breast. Thx

## 2012-09-04 NOTE — Telephone Encounter (Signed)
Pt informed

## 2012-09-10 DIAGNOSIS — Z124 Encounter for screening for malignant neoplasm of cervix: Secondary | ICD-10-CM | POA: Diagnosis not present

## 2012-09-11 ENCOUNTER — Ambulatory Visit
Admission: RE | Admit: 2012-09-11 | Discharge: 2012-09-11 | Disposition: A | Discharge status: Home / Self Care | Payer: Medicare Other | Source: Ambulatory Visit | Attending: Internal Medicine | Admitting: Internal Medicine

## 2012-09-11 ENCOUNTER — Other Ambulatory Visit: Payer: Self-pay | Admitting: Internal Medicine

## 2012-09-11 DIAGNOSIS — R928 Other abnormal and inconclusive findings on diagnostic imaging of breast: Secondary | ICD-10-CM

## 2012-09-17 ENCOUNTER — Ambulatory Visit
Admission: RE | Admit: 2012-09-17 | Discharge: 2012-09-17 | Disposition: A | Discharge status: Home / Self Care | Payer: Medicare Other | Source: Ambulatory Visit | Attending: Internal Medicine | Admitting: Internal Medicine

## 2012-09-17 DIAGNOSIS — N6089 Other benign mammary dysplasias of unspecified breast: Secondary | ICD-10-CM | POA: Diagnosis not present

## 2012-09-17 DIAGNOSIS — R928 Other abnormal and inconclusive findings on diagnostic imaging of breast: Secondary | ICD-10-CM

## 2012-09-17 DIAGNOSIS — N6019 Diffuse cystic mastopathy of unspecified breast: Secondary | ICD-10-CM | POA: Diagnosis not present

## 2012-11-03 ENCOUNTER — Encounter: Payer: Self-pay | Admitting: Internal Medicine

## 2012-11-03 ENCOUNTER — Ambulatory Visit (INDEPENDENT_AMBULATORY_CARE_PROVIDER_SITE_OTHER): Payer: Medicare Other | Admitting: Internal Medicine

## 2012-11-03 VITALS — BP 102/64 | HR 76 | Resp 16 | Wt 110.0 lb

## 2012-11-03 DIAGNOSIS — R634 Abnormal weight loss: Secondary | ICD-10-CM | POA: Diagnosis not present

## 2012-11-03 DIAGNOSIS — H6123 Impacted cerumen, bilateral: Secondary | ICD-10-CM

## 2012-11-03 DIAGNOSIS — R197 Diarrhea, unspecified: Secondary | ICD-10-CM | POA: Diagnosis not present

## 2012-11-03 DIAGNOSIS — M545 Low back pain: Secondary | ICD-10-CM | POA: Diagnosis not present

## 2012-11-03 DIAGNOSIS — F329 Major depressive disorder, single episode, unspecified: Secondary | ICD-10-CM

## 2012-11-03 DIAGNOSIS — H6120 Impacted cerumen, unspecified ear: Secondary | ICD-10-CM

## 2012-11-03 NOTE — Assessment & Plan Note (Signed)
Wt Readings from Last 3 Encounters:  11/03/12 110 lb (49.896 kg)  08/29/12 110 lb (49.896 kg)  05/07/12 110 lb 8 oz (50.122 kg)

## 2012-11-03 NOTE — Assessment & Plan Note (Signed)
Cont w/yoga

## 2012-11-03 NOTE — Assessment & Plan Note (Signed)
She will irrigate at home 

## 2012-11-03 NOTE — Assessment & Plan Note (Signed)
Discussed.

## 2012-11-03 NOTE — Assessment & Plan Note (Signed)
Resolved by and large 

## 2012-11-03 NOTE — Assessment & Plan Note (Signed)
Claritin prn 

## 2012-11-03 NOTE — Progress Notes (Signed)
     Subjective:      F/u OA, GERD, anxiety  Review of Systems  Constitutional: Negative for chills, activity change, appetite change, fatigue and unexpected weight change.  HENT: Negative for congestion, mouth sores and sinus pressure.   Eyes: Negative for visual disturbance.  Respiratory: Negative for cough (mild) and chest tightness.   Gastrointestinal: Negative for nausea and abdominal pain.  Genitourinary: Negative for frequency, difficulty urinating and vaginal pain.  Musculoskeletal: Positive for arthralgias. Negative for back pain and gait problem.  Skin: Negative for pallor and rash.  Neurological: Negative for dizziness, tremors, weakness, numbness and headaches.  Psychiatric/Behavioral: Negative for suicidal ideas, confusion and sleep disturbance. The patient is not nervous/anxious.    Wt Readings from Last 3 Encounters:     BP Readings from Last 3 Encounters:    130/80    110/70   Wt Readings from Last 3 Encounters:  11/03/12 110 lb (49.896 kg)  08/29/12 110 lb (49.896 kg)  05/07/12 110 lb 8 oz (50.122 kg)   BP Readings from Last 3 Encounters:  11/03/12 102/64  08/29/12 104/64  05/07/12 112/72         Objective:   Physical Exam  Constitutional: She appears well-developed. No distress.       Thin   HENT:  Head: Normocephalic.  Right Ear: External ear normal.  Left Ear: External ear normal.  Nose: Nose normal.  Mouth/Throat: Oropharynx is clear and moist.  Eyes: Conjunctivae are normal. Pupils are equal, round, and reactive to light. Right eye exhibits no discharge. Left eye exhibits no discharge.  Neck: Normal range of motion. Neck supple. No JVD present. No tracheal deviation present. No thyromegaly present.  Cardiovascular: Normal rate, regular rhythm and normal heart sounds.   Pulmonary/Chest: No stridor. No respiratory distress. She has no wheezes.  Abdominal: Soft. Bowel sounds are normal. She exhibits no distension and no mass. There is no  tenderness. There is no rebound and no guarding.  Musculoskeletal: She exhibits tenderness (R shoulder is tender w/ROM). She exhibits no edema.  Lymphadenopathy:    She has no cervical adenopathy.  Neurological: She displays normal reflexes. No cranial nerve deficit. She exhibits normal muscle tone. Coordination normal.  Skin: No rash noted. No erythema.  Psychiatric: She has a normal mood and affect. Her behavior is normal. Judgment and thought content normal.   Lab Results  Component Value Date   WBC 6.4 05/07/2012   HGB 12.8 05/07/2012   HCT 37.7 05/07/2012   PLT 254.0 05/07/2012   GLUCOSE 92 05/07/2012   CHOL 194 05/07/2012   TRIG 95.0 05/07/2012   HDL 50.50 05/07/2012   LDLDIRECT 129.5 02/06/2010   LDLCALC 125* 05/07/2012   ALT 14 05/07/2012   AST 18 05/07/2012   NA 140 05/07/2012   K 4.2 05/07/2012   CL 106 05/07/2012   CREATININE 0.6 05/07/2012   BUN 19 05/07/2012   CO2 31 05/07/2012   TSH 4.46 05/07/2012             Assessment & Plan:

## 2012-11-17 DIAGNOSIS — T2600XA Burn of unspecified eyelid and periocular area, initial encounter: Secondary | ICD-10-CM | POA: Diagnosis not present

## 2012-11-17 DIAGNOSIS — H16139 Photokeratitis, unspecified eye: Secondary | ICD-10-CM | POA: Diagnosis not present

## 2012-12-22 DIAGNOSIS — H903 Sensorineural hearing loss, bilateral: Secondary | ICD-10-CM | POA: Diagnosis not present

## 2012-12-22 DIAGNOSIS — H81319 Aural vertigo, unspecified ear: Secondary | ICD-10-CM | POA: Diagnosis not present

## 2013-01-01 DIAGNOSIS — H5210 Myopia, unspecified eye: Secondary | ICD-10-CM | POA: Diagnosis not present

## 2013-01-01 DIAGNOSIS — H04129 Dry eye syndrome of unspecified lacrimal gland: Secondary | ICD-10-CM | POA: Diagnosis not present

## 2013-01-01 DIAGNOSIS — H16109 Unspecified superficial keratitis, unspecified eye: Secondary | ICD-10-CM | POA: Diagnosis not present

## 2013-01-01 DIAGNOSIS — H259 Unspecified age-related cataract: Secondary | ICD-10-CM | POA: Diagnosis not present

## 2013-01-08 ENCOUNTER — Ambulatory Visit: Payer: Medicare Other | Admitting: Psychology

## 2013-01-16 DIAGNOSIS — H903 Sensorineural hearing loss, bilateral: Secondary | ICD-10-CM | POA: Diagnosis not present

## 2013-01-16 DIAGNOSIS — H81319 Aural vertigo, unspecified ear: Secondary | ICD-10-CM | POA: Diagnosis not present

## 2013-01-22 ENCOUNTER — Ambulatory Visit (INDEPENDENT_AMBULATORY_CARE_PROVIDER_SITE_OTHER): Payer: Medicare Other | Admitting: Psychology

## 2013-01-22 DIAGNOSIS — F4322 Adjustment disorder with anxiety: Secondary | ICD-10-CM | POA: Diagnosis not present

## 2013-02-17 ENCOUNTER — Ambulatory Visit (INDEPENDENT_AMBULATORY_CARE_PROVIDER_SITE_OTHER): Payer: Medicare Other | Admitting: *Deleted

## 2013-02-17 DIAGNOSIS — Z23 Encounter for immunization: Secondary | ICD-10-CM

## 2013-02-26 ENCOUNTER — Ambulatory Visit (INDEPENDENT_AMBULATORY_CARE_PROVIDER_SITE_OTHER): Payer: Medicare Other | Admitting: Psychology

## 2013-02-26 DIAGNOSIS — F4322 Adjustment disorder with anxiety: Secondary | ICD-10-CM | POA: Diagnosis not present

## 2013-03-19 ENCOUNTER — Telehealth: Payer: Self-pay | Admitting: Internal Medicine

## 2013-03-19 NOTE — Telephone Encounter (Signed)
Patient Information:  Caller Name: Jolisa  Phone: [phone]  Patient: Julie Montgomery, Julie Montgomery  Gender: Female  DOB: [birth year]  Age: 77 Years  PCP: Plotnikov, Alex (Adults only)  Office Follow Up:  Does the office need to follow up with this patient?: No  Instructions For The Office: N/A  RN Note:  Onset 1000 03/19/13 of crampy abdominal pain.  States she has "been able to walk off some gas." Taking tums and rolaids without relief.  Last BM normal 03/18/13.  Afebrile.  No injury to abdomen.  Notes some bloating as well.  States pain has improved well with walking; states is passing gas.  Per abdominal pain protocol, emergent symptoms denied; advised appt within 24 hours; patient declines at this time.  States will try home care measures and call for appt in AM; callback parameters given.  krs/can  Symptoms  Reason For Call & Symptoms: abdominal pain/cramping  Reviewed Health History In EMR: Yes  Reviewed Medications In EMR: Yes  Reviewed Allergies In EMR: Yes  Reviewed Surgeries / Procedures: Yes  Date of Onset of Symptoms: 03/19/2013  Guideline(s) Used:  Abdominal Pain - Upper  Disposition Per Guideline:   See Today in Office  Reason For Disposition Reached:   Age > 60 years  Advice Given:  Reassurance:  A mild stomachache can be from indigestion, stomach irritation, or overeating. Sometimes a stomachache signals the onset of a vomiting illness from a viral infection.  Here is some care advice that should help.  Fluids:   Sip clear fluids only (e.g., water, flat soft drinks, or half-strength fruit juice) until the pain is gone for 2 hours. Then slowly return to a regular diet.  Fluids:   Sip clear fluids only (e.g., water, flat soft drinks, or half-strength fruit juice) until the pain is gone for 2 hours. Then slowly return to a regular diet.  Diet:  Slowly advance diet from clear liquids to a bland diet.  Avoid alcohol or caffeinated beverages.  Avoid greasy or fatty  foods.  Antacid:  If having pain now, try taking an antacid (e.g., Mylanta, Maalox). Dose: 2 tablespoons (30 ml) of liquid by mouth.  Expected Course:  With harmless causes, the pain usually lessens or is resolved in 2 hours. With gastroenteritis, stomach cramps may precede each bout of vomiting or diarrhea. With serious causes (such as appendicitis), the pain becomes constant and severe.  Expected Course:  With harmless causes, the pain usually lessens or is resolved in 2 hours. With gastroenteritis, stomach cramps may precede each bout of vomiting or diarrhea. With serious causes (such as appendicitis), the pain becomes constant and severe.  Call Back If:  Abdominal pain is constant and present for more than 2 hours.  You become worse.  Patient Refused Recommendation:  Patient Will Follow Up With Office Later  Patient wants to try home care first, then call in AM krs/can

## 2013-04-02 DIAGNOSIS — H8110 Benign paroxysmal vertigo, unspecified ear: Secondary | ICD-10-CM | POA: Diagnosis not present

## 2013-04-02 DIAGNOSIS — R42 Dizziness and giddiness: Secondary | ICD-10-CM | POA: Diagnosis not present

## 2013-04-06 DIAGNOSIS — H00019 Hordeolum externum unspecified eye, unspecified eyelid: Secondary | ICD-10-CM | POA: Diagnosis not present

## 2013-04-09 DIAGNOSIS — L820 Inflamed seborrheic keratosis: Secondary | ICD-10-CM | POA: Diagnosis not present

## 2013-04-24 ENCOUNTER — Encounter: Payer: Self-pay | Admitting: Internal Medicine

## 2013-04-24 ENCOUNTER — Ambulatory Visit (INDEPENDENT_AMBULATORY_CARE_PROVIDER_SITE_OTHER): Payer: Medicare Other | Admitting: Internal Medicine

## 2013-04-24 VITALS — BP 110/70 | HR 69 | Temp 100.4°F

## 2013-04-24 DIAGNOSIS — R05 Cough: Secondary | ICD-10-CM

## 2013-04-24 DIAGNOSIS — J029 Acute pharyngitis, unspecified: Secondary | ICD-10-CM

## 2013-04-24 DIAGNOSIS — J471 Bronchiectasis with (acute) exacerbation: Secondary | ICD-10-CM

## 2013-04-24 MED ORDER — HYDROCODONE-HOMATROPINE 5-1.5 MG/5ML PO SYRP: 5.0000 mL | ORAL_SOLUTION | Freq: Four times a day (QID) | ORAL | Status: DC | PRN

## 2013-04-24 MED ORDER — AZITHROMYCIN 250 MG PO TABS: ORAL_TABLET | ORAL | Status: DC

## 2013-04-24 NOTE — Patient Instructions (Addendum)
It was good to see you today.  Zpak antibiotics and prescription cough syrup - Your prescription(s) have been submitted to your pharmacy. Please take as directed and contact our office if you believe you are having problem(s) with the medication(s).  Alternate between ibuprofen and tylenol for aches, pain and fever symptoms as discussed  Hydrate, rest and call if worse or unimproved  Upper Respiratory Infection, Adult An upper respiratory infection (URI) is also sometimes known as the common cold. The upper respiratory tract includes the nose, sinuses, throat, trachea, and bronchi. Bronchi are the airways leading to the lungs. Most people improve within 1 week, but symptoms can last up to 2 weeks. A residual cough may last even longer.  CAUSES Many different viruses can infect the tissues lining the upper respiratory tract. The tissues become irritated and inflamed and often become very moist. Mucus production is also common. A cold is contagious. You can easily spread the virus to others by oral contact. This includes kissing, sharing a glass, coughing, or sneezing. Touching your mouth or nose and then touching a surface, which is then touched by another person, can also spread the virus. SYMPTOMS  Symptoms typically develop 1 to 3 days after you come in contact with a cold virus. Symptoms vary from person to person. They may include:  Runny nose.  Sneezing.  Nasal congestion.  Sinus irritation.  Sore throat.  Loss of voice (laryngitis).  Cough.  Fatigue.  Muscle aches.  Loss of appetite.  Headache.  Low-grade fever. DIAGNOSIS  You might diagnose your own cold based on familiar symptoms, since most people get a cold 2 to 3 times a year. Your caregiver can confirm this based on your exam. Most importantly, your caregiver can check that your symptoms are not due to another disease such as strep throat, sinusitis, pneumonia, asthma, or epiglottitis. Blood tests, throat tests, and  X-rays are not necessary to diagnose a common cold, but they may sometimes be helpful in excluding other more serious diseases. Your caregiver will decide if any further tests are required. RISKS AND COMPLICATIONS  You may be at risk for a more severe case of the common cold if you smoke cigarettes, have chronic heart disease (such as heart failure) or lung disease (such as asthma), or if you have a weakened immune system. The very young and very old are also at risk for more serious infections. Bacterial sinusitis, middle ear infections, and bacterial pneumonia can complicate the common cold. The common cold can worsen asthma and chronic obstructive pulmonary disease (COPD). Sometimes, these complications can require emergency medical care and may be life-threatening. PREVENTION  The best way to protect against getting a cold is to practice good hygiene. Avoid oral or hand contact with people with cold symptoms. Wash your hands often if contact occurs. There is no clear evidence that vitamin C, vitamin E, echinacea, or exercise reduces the chance of developing a cold. However, it is always recommended to get plenty of rest and practice good nutrition. TREATMENT  Treatment is directed at relieving symptoms. There is no cure. Antibiotics are not effective, because the infection is caused by a virus, not by bacteria. Treatment may include:  Increased fluid intake. Sports drinks offer valuable electrolytes, sugars, and fluids.  Breathing heated mist or steam (vaporizer or shower).  Eating chicken soup or other clear broths, and maintaining good nutrition.  Getting plenty of rest.  Using gargles or lozenges for comfort.  Controlling fevers with ibuprofen or acetaminophen as  directed by your caregiver.  Increasing usage of your inhaler if you have asthma. Zinc gel and zinc lozenges, taken in the first 24 hours of the common cold, can shorten the duration and lessen the severity of symptoms. Pain  medicines may help with fever, muscle aches, and throat pain. A variety of non-prescription medicines are available to treat congestion and runny nose. Your caregiver can make recommendations and may suggest nasal or lung inhalers for other symptoms.  HOME CARE INSTRUCTIONS   Only take over-the-counter or prescription medicines for pain, discomfort, or fever as directed by your caregiver.  Use a warm mist humidifier or inhale steam from a shower to increase air moisture. This may keep secretions moist and make it easier to breathe.  Drink enough water and fluids to keep your urine clear or pale yellow.  Rest as needed.  Return to work when your temperature has returned to normal or as your caregiver advises. You may need to stay home longer to avoid infecting others. You can also use a face mask and careful hand washing to prevent spread of the virus. SEEK MEDICAL CARE IF:   After the first few days, you feel you are getting worse rather than better.  You need your caregiver's advice about medicines to control symptoms.  You develop chills, worsening shortness of breath, or brown or red sputum. These may be signs of pneumonia.  You develop yellow or brown nasal discharge or pain in the face, especially when you bend forward. These may be signs of sinusitis.  You develop a fever, swollen neck glands, pain with swallowing, or white areas in the back of your throat. These may be signs of strep throat. SEEK IMMEDIATE MEDICAL CARE IF:   You have a fever.  You develop severe or persistent headache, ear pain, sinus pain, or chest pain.  You develop wheezing, a prolonged cough, cough up blood, or have a change in your usual mucus (if you have chronic lung disease).  You develop sore muscles or a stiff neck. Document Released: 10/10/2000 Document Revised: 07/09/2011 Document Reviewed: 08/18/2010 [facility] Patient Information 2014 [place], [place].

## 2013-04-24 NOTE — Progress Notes (Signed)
Pre-visit discussion using our clinic review tool. No additional management support is needed unless otherwise documented below in the visit note.  

## 2013-04-24 NOTE — Progress Notes (Signed)
Patient ID: Julie Montgomery, female   DOB: 02-05-1936, 77 y.o.   MRN: [id number]   Subjective:    HPI  complains of cold symptoms  Onset 4 days ago, wax/wane symptoms  Initially associated with rhinorrhea, sneezing, sore throat, mild headache and low grade fever Also myalgias, sinus pressure and mild-mod chest congestion, but dry cough No relief with OTC meds Precipitated by sick contacts Concern for potential pneumonia given history of bronchiectasis and history of pneumonia  Past Medical History  Diagnosis Date  . Osteopenia   . Anxiety   . Pneumonia     hx of 03/2008    Review of Systems Constitutional: No fever or night sweats, no unexpected weight change Pulmonary: No pleurisy or hemoptysis Cardiovascular: No chest pain or palpitations     Objective:   Physical Exam BP 110/70  Pulse 69  Temp(Src) 100.4 F (38 C) (Oral)  SpO2 95% GEN: mildly ill appearing and audible head nd chest congestion HENT: NCAT, mild sinus tenderness L>R, nares with clear discharge, oropharynx mild erythema, no exudate Eyes: Vision grossly intact, no conjunctivitis Lungs: Clear to auscultation without rhonchi or wheeze, no increased work of breathing Cardiovascular: Regular rate and rhythm, no bilateral edema  Lab Results  Component Value Date   WBC 6.4 05/07/2012   HGB 12.8 05/07/2012   HCT 37.7 05/07/2012   PLT 254.0 05/07/2012   GLUCOSE 92 05/07/2012   CHOL 194 05/07/2012   TRIG 95.0 05/07/2012   HDL 50.50 05/07/2012   LDLDIRECT 129.5 02/06/2010   LDLCALC 125* 05/07/2012   ALT 14 05/07/2012   AST 18 05/07/2012   NA 140 05/07/2012   K 4.2 05/07/2012   CL 106 05/07/2012   CREATININE 0.6 05/07/2012   BUN 19 05/07/2012   CO2 31 05/07/2012   TSH 4.46 05/07/2012      Assessment & Plan:  Viral URI With cough and pharyngitis Cough, postnasal drip related to above bronchiectasis exacerbation with above   --- Empiric antibiotics prescribed due to symptom duration greater than 7 days and comorbid  dz  Prescription cough suppression - new prescriptions done  Symptomatic care with Tylenol or Advil, hydration and rest -   salt gargle advised as needed  Patient will call if symptoms worse or unimproved with conservative care and empiric antibiotics

## 2013-05-12 ENCOUNTER — Encounter: Payer: Self-pay | Admitting: Internal Medicine

## 2013-05-12 ENCOUNTER — Other Ambulatory Visit (INDEPENDENT_AMBULATORY_CARE_PROVIDER_SITE_OTHER): Payer: Medicare Other

## 2013-05-12 ENCOUNTER — Ambulatory Visit (INDEPENDENT_AMBULATORY_CARE_PROVIDER_SITE_OTHER): Payer: Medicare Other | Admitting: Internal Medicine

## 2013-05-12 VITALS — BP 130/72 | HR 64 | Temp 98.3°F | Resp 16 | Ht 62.0 in | Wt 108.0 lb

## 2013-05-12 DIAGNOSIS — E7800 Pure hypercholesterolemia, unspecified: Secondary | ICD-10-CM | POA: Diagnosis not present

## 2013-05-12 DIAGNOSIS — R634 Abnormal weight loss: Secondary | ICD-10-CM

## 2013-05-12 DIAGNOSIS — Z0000 Encounter for general adult medical examination without abnormal findings: Secondary | ICD-10-CM | POA: Diagnosis not present

## 2013-05-12 DIAGNOSIS — F411 Generalized anxiety disorder: Secondary | ICD-10-CM

## 2013-05-12 DIAGNOSIS — Z23 Encounter for immunization: Secondary | ICD-10-CM | POA: Diagnosis not present

## 2013-05-12 LAB — CBC WITH DIFFERENTIAL/PLATELET
Basophils Absolute: 0 10*3/uL (ref 0.0–0.1)
Basophils Relative: 0.5 % (ref 0.0–3.0)
EOS PCT: 1.5 % (ref 0.0–5.0)
Eosinophils Absolute: 0.1 10*3/uL (ref 0.0–0.7)
HEMATOCRIT: 38.9 % (ref 36.0–46.0)
HEMOGLOBIN: 13 g/dL (ref 12.0–15.0)
LYMPHS ABS: 1.7 10*3/uL (ref 0.7–4.0)
Lymphocytes Relative: 25.2 % (ref 12.0–46.0)
MCHC: 33.4 g/dL (ref 30.0–36.0)
MCV: 92.8 fl (ref 78.0–100.0)
MONO ABS: 0.4 10*3/uL (ref 0.1–1.0)
Monocytes Relative: 6.2 % (ref 3.0–12.0)
NEUTROS ABS: 4.6 10*3/uL (ref 1.4–7.7)
Neutrophils Relative %: 66.6 % (ref 43.0–77.0)
Platelets: 257 10*3/uL (ref 150.0–400.0)
RBC: 4.19 Mil/uL (ref 3.87–5.11)
RDW: 13.2 % (ref 11.5–14.6)
WBC: 6.9 10*3/uL (ref 4.5–10.5)

## 2013-05-12 LAB — URINALYSIS, ROUTINE W REFLEX MICROSCOPIC
Bilirubin Urine: NEGATIVE
Hgb urine dipstick: NEGATIVE
KETONES UR: NEGATIVE
Nitrite: NEGATIVE
SPECIFIC GRAVITY, URINE: 1.025 (ref 1.000–1.030)
TOTAL PROTEIN, URINE-UPE24: NEGATIVE
URINE GLUCOSE: NEGATIVE
Urobilinogen, UA: 0.2 (ref 0.0–1.0)
pH: 6 (ref 5.0–8.0)

## 2013-05-12 LAB — HEPATIC FUNCTION PANEL
ALBUMIN: 3.8 g/dL (ref 3.5–5.2)
ALT: 13 U/L (ref 0–35)
AST: 16 U/L (ref 0–37)
Alkaline Phosphatase: 85 U/L (ref 39–117)
Bilirubin, Direct: 0.1 mg/dL (ref 0.0–0.3)
TOTAL PROTEIN: 6.9 g/dL (ref 6.0–8.3)
Total Bilirubin: 0.7 mg/dL (ref 0.3–1.2)

## 2013-05-12 LAB — TSH: TSH: 4.2 u[IU]/mL (ref 0.35–5.50)

## 2013-05-12 LAB — BASIC METABOLIC PANEL
BUN: 24 mg/dL — ABNORMAL HIGH (ref 6–23)
CHLORIDE: 106 meq/L (ref 96–112)
CO2: 29 mEq/L (ref 19–32)
Calcium: 9.5 mg/dL (ref 8.4–10.5)
Creatinine, Ser: 0.8 mg/dL (ref 0.4–1.2)
GFR: 78.82 mL/min (ref >60.00)
GLUCOSE: 99 mg/dL (ref 70–99)
POTASSIUM: 4.9 meq/L (ref 3.5–5.1)
SODIUM: 141 meq/L (ref 135–145)

## 2013-05-12 LAB — LIPID PANEL
CHOLESTEROL: 221 mg/dL — AB (ref 0–200)
HDL: 58.9 mg/dL (ref >39.00)
Total CHOL/HDL Ratio: 4
Triglycerides: 68 mg/dL (ref 0.0–149.0)
VLDL: 13.6 mg/dL (ref 0.0–40.0)

## 2013-05-12 LAB — LDL CHOLESTEROL, DIRECT: LDL DIRECT: 145.7 mg/dL

## 2013-05-12 MED ORDER — CLONAZEPAM 0.125 MG PO TBDP: 0.1250 mg | ORAL_TABLET | Freq: Two times a day (BID) | ORAL | Status: DC | PRN

## 2013-05-12 NOTE — Assessment & Plan Note (Signed)
Chronic Sister died in 11/14 Clonazepam low dose

## 2013-05-12 NOTE — Progress Notes (Signed)
Pre visit review using our clinic review tool, if applicable. No additional management support is needed unless otherwise documented below in the visit note. 

## 2013-05-12 NOTE — Assessment & Plan Note (Signed)
  On diet  

## 2013-05-12 NOTE — Assessment & Plan Note (Signed)
108-110 lbs  Diet discussed

## 2013-05-12 NOTE — Progress Notes (Signed)
     Subjective:    The patient is here for a wellness exam. The patient has been doing well overall without major physical or psychological issues going on lately.  F/u OA, GERD, anxiety  Review of Systems  Constitutional: Negative for chills, activity change, appetite change, fatigue and unexpected weight change.  HENT: Negative for congestion, mouth sores and sinus pressure.   Eyes: Negative for visual disturbance.  Respiratory: Negative for cough (mild) and chest tightness.   Gastrointestinal: Negative for nausea and abdominal pain.  Genitourinary: Negative for frequency, difficulty urinating and vaginal pain.  Musculoskeletal: Positive for arthralgias. Negative for back pain and gait problem.  Skin: Negative for pallor and rash.  Neurological: Negative for dizziness, tremors, weakness, numbness and headaches.  Psychiatric/Behavioral: Negative for suicidal ideas, confusion and sleep disturbance. The patient is not nervous/anxious.    Wt Readings from Last 3 Encounters:     BP Readings from Last 3 Encounters:    130/80    110/70   Wt Readings from Last 3 Encounters:  05/12/13 108 lb (48.988 kg)  11/03/12 110 lb (49.896 kg)  08/29/12 110 lb (49.896 kg)   BP Readings from Last 3 Encounters:  05/12/13 130/72  04/24/13 110/70  11/03/12 102/64         Objective:   Physical Exam  Constitutional: She appears well-developed. No distress.       Thin   HENT:  Head: Normocephalic.  Right Ear: External ear normal.  Left Ear: External ear normal.  Nose: Nose normal.  Mouth/Throat: Oropharynx is clear and moist.  Eyes: Conjunctivae are normal. Pupils are equal, round, and reactive to light. Right eye exhibits no discharge. Left eye exhibits no discharge.  Neck: Normal range of motion. Neck supple. No JVD present. No tracheal deviation present. No thyromegaly present.  Cardiovascular: Normal rate, regular rhythm and normal heart sounds.   Pulmonary/Chest: No stridor. No  respiratory distress. She has no wheezes.  Abdominal: Soft. Bowel sounds are normal. She exhibits no distension and no mass. There is no tenderness. There is no rebound and no guarding.  Musculoskeletal: She exhibits tenderness (R shoulder is tender w/ROM). She exhibits no edema.  Lymphadenopathy:    She has no cervical adenopathy.  Neurological: She displays normal reflexes. No cranial nerve deficit. She exhibits normal muscle tone. Coordination normal.  Skin: No rash noted. No erythema.  Psychiatric: She has a normal mood and affect. Her behavior is normal. Judgment and thought content normal.  R dorsal toe cyst - soft  Lab Results  Component Value Date   WBC 6.4 05/07/2012   HGB 12.8 05/07/2012   HCT 37.7 05/07/2012   PLT 254.0 05/07/2012   GLUCOSE 92 05/07/2012   CHOL 194 05/07/2012   TRIG 95.0 05/07/2012   HDL 50.50 05/07/2012   LDLDIRECT 129.5 02/06/2010   LDLCALC 125* 05/07/2012   ALT 14 05/07/2012   AST 18 05/07/2012   NA 140 05/07/2012   K 4.2 05/07/2012   CL 106 05/07/2012   CREATININE 0.6 05/07/2012   BUN 19 05/07/2012   CO2 31 05/07/2012   TSH 4.46 05/07/2012             Assessment & Plan:

## 2013-05-12 NOTE — Assessment & Plan Note (Signed)
The patient is here for annual Medicare wellness examination and management of other chronic and acute problems.   The risk factors are reflected in the social history.  The roster of all physicians providing medical care to patient - is listed in the Snapshot section of the chart.  Activities of daily living:  The patient is 100% inedpendent in all ADLs: dressing, toileting, feeding as well as independent mobility  Home safety : The patient has smoke detectors in the home. They wear seatbelts.No firearms at home ( firearms are present in the home, kept in a safe fashion). There is no violence in the home.   There is no risks for hepatitis, STDs or HIV. There is no   history of blood transfusion. They have no travel history to infectious disease endemic areas of the world.  The patient has (has not) seen their dentist in the last six month. They have (not) seen their eye doctor in the last year. They deny (admit to) any hearing difficulty and have not had audiologic testing in the last year.  They do not  have excessive sun exposure. Discussed the need for sun protection: hats, long sleeves and use of sunscreen if there is significant sun exposure.   Diet: the importance of a healthy diet is discussed. They do have a healthy (unhealthy-high fat/fast food) diet.  The patient has a regular exercise program: no - in pain post-op.  The benefits of regular aerobic exercise were discussed.  Depression screen: there are no signs or vegative symptoms of depression- irritability, change in appetite, anhedonia, sadness/tearfullness.  Cognitive assessment: the patient manages all their financial and personal affairs and is actively engaged. They could relate day,date,year and events; recalled 3/3 objects at 3 minutes; performed clock-face test normally.  The following portions of the patient's history were reviewed and updated as appropriate: allergies, current medications, past family history, past  medical history,  past surgical history, past social history  and problem list.  Vision, hearing, body mass index were assessed and reviewed.   During the course of the visit the patient was educated and counseled about appropriate screening and preventive services including : fall prevention , diabetes screening, nutrition counseling, colorectal cancer screening, and recommended immunizations.

## 2013-06-08 ENCOUNTER — Other Ambulatory Visit: Payer: Self-pay

## 2013-06-08 DIAGNOSIS — D1801 Hemangioma of skin and subcutaneous tissue: Secondary | ICD-10-CM | POA: Diagnosis not present

## 2013-06-08 DIAGNOSIS — L819 Disorder of pigmentation, unspecified: Secondary | ICD-10-CM | POA: Diagnosis not present

## 2013-06-08 DIAGNOSIS — D1739 Benign lipomatous neoplasm of skin and subcutaneous tissue of other sites: Secondary | ICD-10-CM | POA: Diagnosis not present

## 2013-06-08 DIAGNOSIS — L821 Other seborrheic keratosis: Secondary | ICD-10-CM | POA: Diagnosis not present

## 2013-06-08 DIAGNOSIS — C44319 Basal cell carcinoma of skin of other parts of face: Secondary | ICD-10-CM | POA: Diagnosis not present

## 2013-06-08 DIAGNOSIS — L739 Follicular disorder, unspecified: Secondary | ICD-10-CM | POA: Diagnosis not present

## 2013-07-07 DIAGNOSIS — C44319 Basal cell carcinoma of skin of other parts of face: Secondary | ICD-10-CM | POA: Diagnosis not present

## 2013-07-07 DIAGNOSIS — Z85828 Personal history of other malignant neoplasm of skin: Secondary | ICD-10-CM | POA: Diagnosis not present

## 2013-07-14 ENCOUNTER — Other Ambulatory Visit: Payer: Self-pay

## 2013-07-14 DIAGNOSIS — Z1231 Encounter for screening mammogram for malignant neoplasm of breast: Secondary | ICD-10-CM

## 2013-07-24 DIAGNOSIS — H43819 Vitreous degeneration, unspecified eye: Secondary | ICD-10-CM | POA: Diagnosis not present

## 2013-07-24 DIAGNOSIS — H16109 Unspecified superficial keratitis, unspecified eye: Secondary | ICD-10-CM | POA: Diagnosis not present

## 2013-07-24 DIAGNOSIS — H259 Unspecified age-related cataract: Secondary | ICD-10-CM | POA: Diagnosis not present

## 2013-07-24 DIAGNOSIS — H04129 Dry eye syndrome of unspecified lacrimal gland: Secondary | ICD-10-CM | POA: Diagnosis not present

## 2013-07-28 DIAGNOSIS — H25049 Posterior subcapsular polar age-related cataract, unspecified eye: Secondary | ICD-10-CM | POA: Diagnosis not present

## 2013-07-28 DIAGNOSIS — H2510 Age-related nuclear cataract, unspecified eye: Secondary | ICD-10-CM | POA: Diagnosis not present

## 2013-07-28 DIAGNOSIS — H25039 Anterior subcapsular polar age-related cataract, unspecified eye: Secondary | ICD-10-CM | POA: Diagnosis not present

## 2013-07-28 DIAGNOSIS — H269 Unspecified cataract: Secondary | ICD-10-CM | POA: Diagnosis not present

## 2013-08-10 DIAGNOSIS — R059 Cough, unspecified: Secondary | ICD-10-CM | POA: Diagnosis not present

## 2013-08-10 DIAGNOSIS — J479 Bronchiectasis, uncomplicated: Secondary | ICD-10-CM | POA: Diagnosis not present

## 2013-08-10 DIAGNOSIS — R05 Cough: Secondary | ICD-10-CM | POA: Diagnosis not present

## 2013-08-13 ENCOUNTER — Other Ambulatory Visit: Payer: Self-pay | Admitting: Dermatology

## 2013-08-13 DIAGNOSIS — Z09 Encounter for follow-up examination after completed treatment for conditions other than malignant neoplasm: Secondary | ICD-10-CM | POA: Diagnosis not present

## 2013-08-13 DIAGNOSIS — C44320 Squamous cell carcinoma of skin of unspecified parts of face: Secondary | ICD-10-CM | POA: Diagnosis not present

## 2013-08-13 DIAGNOSIS — D485 Neoplasm of uncertain behavior of skin: Secondary | ICD-10-CM | POA: Diagnosis not present

## 2013-08-27 DIAGNOSIS — C44320 Squamous cell carcinoma of skin of unspecified parts of face: Secondary | ICD-10-CM | POA: Diagnosis not present

## 2013-08-27 DIAGNOSIS — Z85828 Personal history of other malignant neoplasm of skin: Secondary | ICD-10-CM | POA: Diagnosis not present

## 2013-09-01 ENCOUNTER — Ambulatory Visit
Admission: RE | Admit: 2013-09-01 | Discharge: 2013-09-01 | Disposition: A | Discharge status: Home / Self Care | Payer: Medicare Other | Source: Ambulatory Visit

## 2013-09-01 DIAGNOSIS — Z1231 Encounter for screening mammogram for malignant neoplasm of breast: Secondary | ICD-10-CM | POA: Diagnosis not present

## 2013-09-08 DIAGNOSIS — H25019 Cortical age-related cataract, unspecified eye: Secondary | ICD-10-CM | POA: Diagnosis not present

## 2013-09-08 DIAGNOSIS — H269 Unspecified cataract: Secondary | ICD-10-CM | POA: Diagnosis not present

## 2013-09-08 DIAGNOSIS — H2510 Age-related nuclear cataract, unspecified eye: Secondary | ICD-10-CM | POA: Diagnosis not present

## 2013-09-14 DIAGNOSIS — K6289 Other specified diseases of anus and rectum: Secondary | ICD-10-CM | POA: Diagnosis not present

## 2013-09-14 DIAGNOSIS — R3915 Urgency of urination: Secondary | ICD-10-CM | POA: Diagnosis not present

## 2013-09-14 DIAGNOSIS — M899 Disorder of bone, unspecified: Secondary | ICD-10-CM | POA: Diagnosis not present

## 2013-09-14 DIAGNOSIS — Z124 Encounter for screening for malignant neoplasm of cervix: Secondary | ICD-10-CM | POA: Diagnosis not present

## 2013-09-14 DIAGNOSIS — M949 Disorder of cartilage, unspecified: Secondary | ICD-10-CM | POA: Diagnosis not present

## 2013-11-10 ENCOUNTER — Telehealth: Payer: Self-pay | Admitting: *Deleted

## 2013-11-10 ENCOUNTER — Ambulatory Visit (INDEPENDENT_AMBULATORY_CARE_PROVIDER_SITE_OTHER): Payer: Medicare Other | Admitting: Internal Medicine

## 2013-11-10 ENCOUNTER — Encounter: Payer: Self-pay | Admitting: Internal Medicine

## 2013-11-10 VITALS — BP 120/60 | HR 68 | Temp 98.7°F | Resp 16 | Wt 106.0 lb

## 2013-11-10 DIAGNOSIS — F329 Major depressive disorder, single episode, unspecified: Secondary | ICD-10-CM | POA: Diagnosis not present

## 2013-11-10 DIAGNOSIS — M25519 Pain in unspecified shoulder: Secondary | ICD-10-CM

## 2013-11-10 DIAGNOSIS — R634 Abnormal weight loss: Secondary | ICD-10-CM | POA: Diagnosis not present

## 2013-11-10 DIAGNOSIS — F519 Sleep disorder not due to a substance or known physiological condition, unspecified: Secondary | ICD-10-CM

## 2013-11-10 DIAGNOSIS — F4321 Adjustment disorder with depressed mood: Secondary | ICD-10-CM

## 2013-11-10 DIAGNOSIS — F3289 Other specified depressive episodes: Secondary | ICD-10-CM

## 2013-11-10 DIAGNOSIS — M25511 Pain in right shoulder: Secondary | ICD-10-CM

## 2013-11-10 NOTE — Assessment & Plan Note (Signed)
Doing ok.

## 2013-11-10 NOTE — Assessment & Plan Note (Signed)
Grieving discussed  

## 2013-11-10 NOTE — Assessment & Plan Note (Signed)
Wt Readings from Last 3 Encounters:  11/10/13 106 lb (48.081 kg)  05/12/13 108 lb (48.988 kg)  11/03/12 110 lb (49.896 kg)

## 2013-11-10 NOTE — Telephone Encounter (Signed)
Pt forgot to mention:  1. leg cramps that she has early in the am.   2. She states she has not had a Colonscopy in a while. She wants to know if she should have one or the Cologuard screening? 3. She would like to talk to someone about grief if you recommend it. Please advise.

## 2013-11-10 NOTE — Progress Notes (Signed)
     Subjective:      F/u OA, GERD, anxiety - her sister and brother died this year; wt loss  Review of Systems  Constitutional: Negative for chills, activity change, appetite change, fatigue and unexpected weight change.  HENT: Negative for congestion, mouth sores and sinus pressure.   Eyes: Negative for visual disturbance.  Respiratory: Negative for cough (mild) and chest tightness.   Gastrointestinal: Negative for nausea and abdominal pain.  Genitourinary: Negative for frequency, difficulty urinating and vaginal pain.  Musculoskeletal: Positive for arthralgias. Negative for back pain and gait problem.  Skin: Negative for pallor and rash.  Neurological: Negative for dizziness, tremors, weakness, numbness and headaches.  Psychiatric/Behavioral: Negative for suicidal ideas, confusion and sleep disturbance. The patient is sad, anxious.    Wt Readings from Last 3 Encounters:     BP Readings from Last 3 Encounters:    130/80    110/70   Wt Readings from Last 3 Encounters:  11/10/13 106 lb (48.081 kg)  05/12/13 108 lb (48.988 kg)  11/03/12 110 lb (49.896 kg)   BP Readings from Last 3 Encounters:  11/10/13 120/60  05/12/13 130/72  04/24/13 110/70         Objective:   Physical Exam  Constitutional: She appears well-developed. No distress.       Thin   HENT:  Head: Normocephalic.  Right Ear: External ear normal.  Left Ear: External ear normal.  Nose: Nose normal.  Mouth/Throat: Oropharynx is clear and moist.  Eyes: Conjunctivae are normal. Pupils are equal, round, and reactive to light. Right eye exhibits no discharge. Left eye exhibits no discharge.  Neck: Normal range of motion. Neck supple. No JVD present. No tracheal deviation present. No thyromegaly present.  Cardiovascular: Normal rate, regular rhythm and normal heart sounds.   Pulmonary/Chest: No stridor. No respiratory distress. She has no wheezes.  Abdominal: Soft. Bowel sounds are normal. She exhibits no  distension and no mass. There is no tenderness. There is no rebound and no guarding.  Musculoskeletal: She exhibits no edema.  Lymphadenopathy:    She has no cervical adenopathy.  Neurological: She displays normal reflexes. No cranial nerve deficit. She exhibits normal muscle tone. Coordination normal.  Skin: No rash noted. No erythema.  Psychiatric: She has a sad mood and affect. Her behavior is normal. Judgment and thought content normal. Tearful R dorsal toe cyst - soft  Lab Results  Component Value Date   WBC 6.9 05/12/2013   HGB 13.0 05/12/2013   HCT 38.9 05/12/2013   PLT 257.0 05/12/2013   GLUCOSE 99 05/12/2013   CHOL 221* 05/12/2013   TRIG 68.0 05/12/2013   HDL 58.90 05/12/2013   LDLDIRECT 145.7 05/12/2013   LDLCALC 125* 05/07/2012   ALT 13 05/12/2013   AST 16 05/12/2013   NA 141 05/12/2013   K 4.9 05/12/2013   CL 106 05/12/2013   CREATININE 0.8 05/12/2013   BUN 24* 05/12/2013   CO2 29 05/12/2013   TSH 4.20 05/12/2013             Assessment & Plan:

## 2013-11-10 NOTE — Progress Notes (Signed)
Pre visit review using our clinic review tool, if applicable. No additional management support is needed unless otherwise documented below in the visit note. 

## 2013-11-11 NOTE — Telephone Encounter (Signed)
Tylenol pm for cramps I'll arrange for Cologuard I'll sch psychol consult Thx

## 2013-11-12 NOTE — Telephone Encounter (Signed)
Pt notified per MD 

## 2013-11-19 DIAGNOSIS — Z1211 Encounter for screening for malignant neoplasm of colon: Secondary | ICD-10-CM | POA: Diagnosis not present

## 2013-11-20 LAB — COLOGUARD

## 2013-12-02 ENCOUNTER — Telehealth: Payer: Self-pay | Admitting: *Deleted

## 2013-12-02 NOTE — Telephone Encounter (Signed)
Pt informed of 11/20/13 negative Cologuard colon cancer screening.

## 2013-12-03 ENCOUNTER — Ambulatory Visit (INDEPENDENT_AMBULATORY_CARE_PROVIDER_SITE_OTHER): Payer: Medicare Other | Admitting: Psychology

## 2013-12-03 DIAGNOSIS — F4322 Adjustment disorder with anxiety: Secondary | ICD-10-CM

## 2013-12-09 ENCOUNTER — Encounter: Payer: Self-pay | Admitting: Internal Medicine

## 2013-12-15 ENCOUNTER — Ambulatory Visit (INDEPENDENT_AMBULATORY_CARE_PROVIDER_SITE_OTHER): Payer: Medicare Other | Admitting: Psychology

## 2013-12-15 DIAGNOSIS — F4322 Adjustment disorder with anxiety: Secondary | ICD-10-CM | POA: Diagnosis not present

## 2014-01-20 ENCOUNTER — Ambulatory Visit (INDEPENDENT_AMBULATORY_CARE_PROVIDER_SITE_OTHER): Payer: Medicare Other

## 2014-01-20 DIAGNOSIS — Z23 Encounter for immunization: Secondary | ICD-10-CM | POA: Diagnosis not present

## 2014-02-09 DIAGNOSIS — Z85828 Personal history of other malignant neoplasm of skin: Secondary | ICD-10-CM | POA: Diagnosis not present

## 2014-02-09 DIAGNOSIS — D2372 Other benign neoplasm of skin of left lower limb, including hip: Secondary | ICD-10-CM | POA: Diagnosis not present

## 2014-02-09 DIAGNOSIS — L814 Other melanin hyperpigmentation: Secondary | ICD-10-CM | POA: Diagnosis not present

## 2014-02-09 DIAGNOSIS — L718 Other rosacea: Secondary | ICD-10-CM | POA: Diagnosis not present

## 2014-02-09 DIAGNOSIS — L821 Other seborrheic keratosis: Secondary | ICD-10-CM | POA: Diagnosis not present

## 2014-02-11 ENCOUNTER — Ambulatory Visit: Payer: Medicare Other | Admitting: Internal Medicine

## 2014-02-11 DIAGNOSIS — Z0289 Encounter for other administrative examinations: Secondary | ICD-10-CM

## 2014-03-05 ENCOUNTER — Other Ambulatory Visit (INDEPENDENT_AMBULATORY_CARE_PROVIDER_SITE_OTHER): Payer: Medicare Other

## 2014-03-05 ENCOUNTER — Ambulatory Visit (INDEPENDENT_AMBULATORY_CARE_PROVIDER_SITE_OTHER): Payer: Medicare Other | Admitting: Internal Medicine

## 2014-03-05 ENCOUNTER — Encounter: Payer: Self-pay | Admitting: Internal Medicine

## 2014-03-05 VITALS — BP 112/60 | HR 73 | Temp 98.9°F | Wt 107.0 lb

## 2014-03-05 DIAGNOSIS — F4323 Adjustment disorder with mixed anxiety and depressed mood: Secondary | ICD-10-CM | POA: Diagnosis not present

## 2014-03-05 DIAGNOSIS — R634 Abnormal weight loss: Secondary | ICD-10-CM | POA: Diagnosis not present

## 2014-03-05 DIAGNOSIS — M5440 Lumbago with sciatica, unspecified side: Secondary | ICD-10-CM | POA: Diagnosis not present

## 2014-03-05 LAB — URINALYSIS
Bilirubin Urine: NEGATIVE
Hgb urine dipstick: NEGATIVE
KETONES UR: NEGATIVE
Leukocytes, UA: NEGATIVE
Nitrite: NEGATIVE
PH: 5.5 (ref 5.0–8.0)
Specific Gravity, Urine: 1.02 (ref 1.000–1.030)
Total Protein, Urine: NEGATIVE
URINE GLUCOSE: NEGATIVE
UROBILINOGEN UA: 0.2 (ref 0.0–1.0)

## 2014-03-05 LAB — BASIC METABOLIC PANEL
BUN: 25 mg/dL — AB (ref 6–23)
CO2: 25 mEq/L (ref 19–32)
Calcium: 9.3 mg/dL (ref 8.4–10.5)
Chloride: 109 mEq/L (ref 96–112)
Creatinine, Ser: 0.6 mg/dL (ref 0.4–1.2)
GFR: 105.33 mL/min (ref >60.00)
Glucose, Bld: 86 mg/dL (ref 70–99)
Potassium: 4 mEq/L (ref 3.5–5.1)
Sodium: 141 mEq/L (ref 135–145)

## 2014-03-05 LAB — HEPATIC FUNCTION PANEL
ALBUMIN: 3.3 g/dL — AB (ref 3.5–5.2)
ALK PHOS: 82 U/L (ref 39–117)
ALT: 15 U/L (ref 0–35)
AST: 17 U/L (ref 0–37)
BILIRUBIN DIRECT: 0.1 mg/dL (ref 0.0–0.3)
TOTAL PROTEIN: 6.5 g/dL (ref 6.0–8.3)
Total Bilirubin: 0.3 mg/dL (ref 0.2–1.2)

## 2014-03-05 LAB — TSH: TSH: 2.76 u[IU]/mL (ref 0.35–4.50)

## 2014-03-05 MED ORDER — ERYTHROMYCIN 2 % EX PADS: MEDICATED_PAD | CUTANEOUS | Status: DC

## 2014-03-05 NOTE — Assessment & Plan Note (Signed)
Wt Readings from Last 3 Encounters:  03/05/14 107 lb (48.535 kg)  11/10/13 106 lb (48.081 kg)  05/12/13 108 lb (48.988 kg)

## 2014-03-05 NOTE — Progress Notes (Signed)
Pre visit review using our clinic review tool, if applicable. No additional management support is needed unless otherwise documented below in the visit note. 

## 2014-03-05 NOTE — Assessment & Plan Note (Signed)
Continue with current prescription therapy as reflected on the Med list.  

## 2014-03-05 NOTE — Progress Notes (Signed)
     Subjective:      F/u OA, GERD, anxiety - her sister and brother died this year; wt loss   Review of Systems  Constitutional: Negative for chills, activity change, appetite change, fatigue and unexpected weight change.  HENT: Negative for congestion, mouth sores and sinus pressure.   Eyes: Negative for visual disturbance.  Respiratory: Negative for cough (mild) and chest tightness.   Gastrointestinal: Negative for nausea and abdominal pain.  Genitourinary: Negative for frequency, difficulty urinating and vaginal pain.  Musculoskeletal: Positive for arthralgias. Negative for back pain and gait problem.  Skin: Negative for pallor and rash.  Neurological: Negative for dizziness, tremors, weakness, numbness and headaches.  Psychiatric/Behavioral: Negative for suicidal ideas, confusion and sleep disturbance. The patient is sad, anxious.    Wt Readings from Last 3 Encounters:     BP Readings from Last 3 Encounters:    130/80    110/70   Wt Readings from Last 3 Encounters:  03/05/14 107 lb (48.535 kg)  11/10/13 106 lb (48.081 kg)  05/12/13 108 lb (48.988 kg)   BP Readings from Last 3 Encounters:  03/05/14 112/60  11/10/13 120/60  05/12/13 130/72         Objective:   Physical Exam  Constitutional: She appears well-developed. No distress.       Thin   HENT:  Head: Normocephalic.  Right Ear: External ear normal.  Left Ear: External ear normal.  Nose: Nose normal.  Mouth/Throat: Oropharynx is clear and moist.  Eyes: Conjunctivae are normal. Pupils are equal, round, and reactive to light. Right eye exhibits no discharge. Left eye exhibits no discharge.  Neck: Normal range of motion. Neck supple. No JVD present. No tracheal deviation present. No thyromegaly present.  Cardiovascular: Normal rate, regular rhythm and normal heart sounds.   Pulmonary/Chest: No stridor. No respiratory distress. She has no wheezes.  Abdominal: Soft. Bowel sounds are normal. She exhibits no  distension and no mass. There is no tenderness. There is no rebound and no guarding.  Musculoskeletal: She exhibits no edema.  Lymphadenopathy:    She has no cervical adenopathy.  Neurological: She displays normal reflexes. No cranial nerve deficit. She exhibits normal muscle tone. Coordination normal.  Skin: No rash noted. No erythema.  Psychiatric: She has a sad mood and affect. Her behavior is normal. Judgment and thought content normal. Tearful R dorsal toe cyst - soft  Lab Results  Component Value Date   WBC 6.9 05/12/2013   HGB 13.0 05/12/2013   HCT 38.9 05/12/2013   PLT 257.0 05/12/2013   GLUCOSE 99 05/12/2013   CHOL 221* 05/12/2013   TRIG 68.0 05/12/2013   HDL 58.90 05/12/2013   LDLDIRECT 145.7 05/12/2013   LDLCALC 125* 05/07/2012   ALT 13 05/12/2013   AST 16 05/12/2013   NA 141 05/12/2013   K 4.9 05/12/2013   CL 106 05/12/2013   CREATININE 0.8 05/12/2013   BUN 24* 05/12/2013   CO2 29 05/12/2013   TSH 4.20 05/12/2013             Assessment & Plan:

## 2014-03-05 NOTE — Patient Instructions (Signed)
Wt Readings from Last 3 Encounters:  03/05/14 107 lb (48.535 kg)  11/10/13 106 lb (48.081 kg)  05/12/13 108 lb (48.988 kg)

## 2014-03-05 NOTE — Assessment & Plan Note (Signed)
Doing well 

## 2014-03-23 ENCOUNTER — Ambulatory Visit (INDEPENDENT_AMBULATORY_CARE_PROVIDER_SITE_OTHER): Payer: Medicare Other | Admitting: Family

## 2014-03-23 ENCOUNTER — Encounter: Payer: Self-pay | Admitting: Family

## 2014-03-23 ENCOUNTER — Ambulatory Visit (INDEPENDENT_AMBULATORY_CARE_PROVIDER_SITE_OTHER)
Admission: RE | Admit: 2014-03-23 | Discharge: 2014-03-23 | Disposition: A | Discharge status: Home / Self Care | Payer: Medicare Other | Source: Ambulatory Visit | Attending: Family | Admitting: Family

## 2014-03-23 VITALS — BP 120/64 | HR 75 | Temp 98.1°F | Resp 18 | Ht 62.5 in | Wt 109.0 lb

## 2014-03-23 DIAGNOSIS — R059 Cough, unspecified: Secondary | ICD-10-CM

## 2014-03-23 DIAGNOSIS — J069 Acute upper respiratory infection, unspecified: Secondary | ICD-10-CM

## 2014-03-23 DIAGNOSIS — H6123 Impacted cerumen, bilateral: Secondary | ICD-10-CM | POA: Diagnosis not present

## 2014-03-23 DIAGNOSIS — R05 Cough: Secondary | ICD-10-CM

## 2014-03-23 DIAGNOSIS — R0602 Shortness of breath: Secondary | ICD-10-CM | POA: Diagnosis not present

## 2014-03-23 MED ORDER — ALBUTEROL SULFATE HFA 108 (90 BASE) MCG/ACT IN AERS: 2.0000 | INHALATION_SPRAY | Freq: Four times a day (QID) | RESPIRATORY_TRACT | Status: DC | PRN

## 2014-03-23 MED ORDER — ALBUTEROL SULFATE (2.5 MG/3ML) 0.083% IN NEBU
2.5000 mg | INHALATION_SOLUTION | Freq: Once | RESPIRATORY_TRACT | Status: AC
  Administered 2014-03-23: 2.5 mg via RESPIRATORY_TRACT

## 2014-03-23 MED ORDER — LEVOFLOXACIN 500 MG PO TABS: 500.0000 mg | ORAL_TABLET | Freq: Every day | ORAL | Status: DC

## 2014-03-23 NOTE — Progress Notes (Signed)
Pre visit review using our clinic review tool, if applicable. No additional management support is needed unless otherwise documented below in the visit note. 

## 2014-03-23 NOTE — Progress Notes (Signed)
   Subjective:    Patient ID: Julie Montgomery, female    DOB: 07-23-1935, 78 y.o.   MRN: [id number]  Chief Complaint  Patient presents with  . Nasal Congestion    cough, congestion, sore throat x5 days    HPI:  Julie Montgomery is a 78 y.o. female who presents today for an acute visit.   Acute symptoms of cough, congestion and sore throat began about 5 days ago. Describes feeling stopped up and hoarse. Has taken tylenol for the headache. Takes Mucinex everyday for a bronchial problem. Has felt bad but has not taken her temperature so unknown fever. Believes the course has progressively worsened with today noting increased congestion.   Allergies  Allergen Reactions  . Fluoxetine Hcl     REACTION: jumpy  . Lovastatin   . Mometasone Furo-Formoterol Fum     Loss of appetite, laryngitis   . Sulfa Antibiotics    Current Outpatient Prescriptions on File Prior to Visit  Medication Sig Dispense Refill  . acetaminophen (TYLENOL) 500 MG tablet Take 500 mg by mouth every 6 (six) hours as needed.    Marland Kitchen aspirin EC 325 MG tablet Take 325 mg by mouth as needed.    . Cholecalciferol (VITAMIN D3) 1000 UNITS CAPS Take by mouth daily. Take total of 2 daily     . CRANBERRY PO Take by mouth daily.    . Erythromycin 2 % PADS Use qd on face 30 each 5  . fluticasone (FLONASE) 50 MCG/ACT nasal spray Place 1 spray into the nose daily as needed.    . clonazepam (KLONOPIN) 0.125 MG disintegrating tablet Take 1 tablet (0.125 mg total) by mouth 2 (two) times daily as needed (anxiety). (Patient not taking: Reported on 03/23/2014) 60 tablet 1   No current facility-administered medications on file prior to visit.    Review of Systems    See HPI  Objective:    BP 120/64 mmHg  Pulse 75  Temp(Src) 98.1 F (36.7 C) (Oral)  Resp 18  Ht 5' 2.5" (1.588 m)  Wt 109 lb (49.442 kg)  BMI 19.61 kg/m2  SpO2 95% Nursing note and vital signs reviewed.  Physical Exam  Constitutional: She is oriented to person,  place, and time. She appears well-developed and well-nourished. No distress.  HENT:  Right Ear: Hearing, external ear and ear canal normal.  Left Ear: Hearing, external ear and ear canal normal.  Nose: Nose normal. Right sinus exhibits no maxillary sinus tenderness and no frontal sinus tenderness. Left sinus exhibits no maxillary sinus tenderness and no frontal sinus tenderness.  Mouth/Throat: Uvula is midline, oropharynx is clear and moist and mucous membranes are normal.  TM canal not visible secondary to impacted cerumen in both ears.   Cardiovascular: Normal rate, regular rhythm and normal heart sounds.   Pulmonary/Chest: Effort normal. She has wheezes.  Lymphadenopathy:    She has cervical adenopathy.  Neurological: She is alert and oriented to person, place, and time.  Skin: Skin is warm and dry.  Psychiatric: She has a normal mood and affect. Her behavior is normal. Judgment and thought content normal.       Assessment & Plan:

## 2014-03-23 NOTE — Patient Instructions (Addendum)
Thank you for choosing [facility].  Summary/Instructions:  Your prescription(s) have been submitted to your pharmacy. Please take as directed and contact our office if you believe you are having problem(s) with the medication(s).  Please stop by radiology on the basement level of the building for your x-rays. Your results will be released to [facility] (or called to you) after review, usually within 72hours after test completion. If any changes need to be made, you will be notified at that same time.  If your symptoms worsen or fail to improve, please contact our office for further instruction, or in case of emergency go directly to the emergency room at the closest medical facility.   Upper Respiratory Infection, Adult An upper respiratory infection (URI) is also sometimes known as the common cold. The upper respiratory tract includes the nose, sinuses, throat, trachea, and bronchi. Bronchi are the airways leading to the lungs. Most people improve within 1 week, but symptoms can last up to 2 weeks. A residual cough may last even longer.  CAUSES Many different viruses can infect the tissues lining the upper respiratory tract. The tissues become irritated and inflamed and often become very moist. Mucus production is also common. A cold is contagious. You can easily spread the virus to others by oral contact. This includes kissing, sharing a glass, coughing, or sneezing. Touching your mouth or nose and then touching a surface, which is then touched by another person, can also spread the virus. SYMPTOMS  Symptoms typically develop 1 to 3 days after you come in contact with a cold virus. Symptoms vary from person to person. They may include:  Runny nose.  Sneezing.  Nasal congestion.  Sinus irritation.  Sore throat.  Loss of voice (laryngitis).  Cough.  Fatigue.  Muscle aches.  Loss of appetite.  Headache.  Low-grade fever. DIAGNOSIS  You might diagnose your own cold based  on familiar symptoms, since most people get a cold 2 to 3 times a year. Your caregiver can confirm this based on your exam. Most importantly, your caregiver can check that your symptoms are not due to another disease such as strep throat, sinusitis, pneumonia, asthma, or epiglottitis. Blood tests, throat tests, and X-rays are not necessary to diagnose a common cold, but they may sometimes be helpful in excluding other more serious diseases. Your caregiver will decide if any further tests are required. RISKS AND COMPLICATIONS  You may be at risk for a more severe case of the common cold if you smoke cigarettes, have chronic heart disease (such as heart failure) or lung disease (such as asthma), or if you have a weakened immune system. The very young and very old are also at risk for more serious infections. Bacterial sinusitis, middle ear infections, and bacterial pneumonia can complicate the common cold. The common cold can worsen asthma and chronic obstructive pulmonary disease (COPD). Sometimes, these complications can require emergency medical care and may be life-threatening. PREVENTION  The best way to protect against getting a cold is to practice good hygiene. Avoid oral or hand contact with people with cold symptoms. Wash your hands often if contact occurs. There is no clear evidence that vitamin C, vitamin E, echinacea, or exercise reduces the chance of developing a cold. However, it is always recommended to get plenty of rest and practice good nutrition. TREATMENT  Treatment is directed at relieving symptoms. There is no cure. Antibiotics are not effective, because the infection is caused by a virus, not by bacteria. Treatment may include:  Increased fluid intake. Sports drinks offer valuable electrolytes, sugars, and fluids.  Breathing heated mist or steam (vaporizer or shower).  Eating chicken soup or other clear broths, and maintaining good nutrition.  Getting plenty of rest.  Using  gargles or lozenges for comfort.  Controlling fevers with ibuprofen or acetaminophen as directed by your caregiver.  Increasing usage of your inhaler if you have asthma. Zinc gel and zinc lozenges, taken in the first 24 hours of the common cold, can shorten the duration and lessen the severity of symptoms. Pain medicines may help with fever, muscle aches, and throat pain. A variety of non-prescription medicines are available to treat congestion and runny nose. Your caregiver can make recommendations and may suggest nasal or lung inhalers for other symptoms.  HOME CARE INSTRUCTIONS   Only take over-the-counter or prescription medicines for pain, discomfort, or fever as directed by your caregiver.  Use a warm mist humidifier or inhale steam from a shower to increase air moisture. This may keep secretions moist and make it easier to breathe.  Drink enough water and fluids to keep your urine clear or pale yellow.  Rest as needed.  Return to work when your temperature has returned to normal or as your caregiver advises. You may need to stay home longer to avoid infecting others. You can also use a face mask and careful hand washing to prevent spread of the virus. SEEK MEDICAL CARE IF:   After the first few days, you feel you are getting worse rather than better.  You need your caregiver's advice about medicines to control symptoms.  You develop chills, worsening shortness of breath, or brown or red sputum. These may be signs of pneumonia.  You develop yellow or brown nasal discharge or pain in the face, especially when you bend forward. These may be signs of sinusitis.  You develop a fever, swollen neck glands, pain with swallowing, or white areas in the back of your throat. These may be signs of strep throat. SEEK IMMEDIATE MEDICAL CARE IF:   You have a fever.  You develop severe or persistent headache, ear pain, sinus pain, or chest pain.  You develop wheezing, a prolonged cough, cough  up blood, or have a change in your usual mucus (if you have chronic lung disease).  You develop sore muscles or a stiff neck. Document Released: 10/10/2000 Document Revised: 07/09/2011 Document Reviewed: 07/22/2013 [facility] Patient Information 2015 [place], [place]. This information is not intended to replace advice given to you by your health care provider. Make sure you discuss any questions you have with your health care provider.

## 2014-03-23 NOTE — Assessment & Plan Note (Signed)
Irrigation provided in office without complication. Ears improved. Given instructions on ear cleaning at home.

## 2014-03-23 NOTE — Assessment & Plan Note (Signed)
Symptoms and exam consistent with bacterial URI. With wheezing cannot rule out PNA. Obtain chest x-ray. In office albuterol given with improvement in wheezing.  Start levofloxacin x 7 days. Continue OTC medications as needed for symptom relief. Follow up if symptoms worsen or fail to improve.

## 2014-04-06 DIAGNOSIS — L814 Other melanin hyperpigmentation: Secondary | ICD-10-CM | POA: Diagnosis not present

## 2014-04-06 DIAGNOSIS — L820 Inflamed seborrheic keratosis: Secondary | ICD-10-CM | POA: Diagnosis not present

## 2014-04-06 DIAGNOSIS — L718 Other rosacea: Secondary | ICD-10-CM | POA: Diagnosis not present

## 2014-04-06 DIAGNOSIS — Z85828 Personal history of other malignant neoplasm of skin: Secondary | ICD-10-CM | POA: Diagnosis not present

## 2014-04-14 ENCOUNTER — Encounter: Payer: Self-pay | Admitting: Internal Medicine

## 2014-04-14 DIAGNOSIS — G5622 Lesion of ulnar nerve, left upper limb: Secondary | ICD-10-CM | POA: Diagnosis not present

## 2014-04-14 DIAGNOSIS — M47812 Spondylosis without myelopathy or radiculopathy, cervical region: Secondary | ICD-10-CM | POA: Diagnosis not present

## 2014-04-19 ENCOUNTER — Encounter: Payer: Self-pay | Admitting: Family

## 2014-04-19 ENCOUNTER — Ambulatory Visit (INDEPENDENT_AMBULATORY_CARE_PROVIDER_SITE_OTHER): Payer: Medicare Other | Admitting: Family

## 2014-04-19 DIAGNOSIS — R208 Other disturbances of skin sensation: Secondary | ICD-10-CM

## 2014-04-19 DIAGNOSIS — R200 Anesthesia of skin: Secondary | ICD-10-CM

## 2014-04-19 MED ORDER — ALPRAZOLAM 0.25 MG PO TABS: 0.2500 mg | ORAL_TABLET | Freq: Once | ORAL | Status: DC

## 2014-04-19 MED ORDER — METHYLPREDNISOLONE (PAK) 4 MG PO TABS: ORAL_TABLET | ORAL | Status: DC

## 2014-04-19 NOTE — Progress Notes (Signed)
Subjective:    Patient ID: Julie Montgomery, female    DOB: 1935-11-11, 78 y.o.   MRN: [id number]  Chief Complaint  Patient presents with  . Hand Problem    Left hand is weak, will not close together, feels a little numbness in the ring and pinky finger, has been like this since cleaning window screens a week ago    HPI:  Julie Montgomery is a 78 y.o. female who presents today for an acute visit.   Acute symptoms of left hand weakness started approximately 1 week ago. She was washing windows and cleaning screens when she noticed that her left hand felt "funny."  Later that evening she noted not having the ability to adduct her 3-5 fingers. Also notes limited control with the fingers. X-rays were completed of her left hand and cervical spine and were negative for any pathology. Currently she's experienced decreased mobility of her left hand and decreased grip strength. Specifically, she is unable to adduct her third through fifth fingers. She has not tried any treatments to this point. There is nothing that makes it better or worse.  Allergies  Allergen Reactions  . Fluoxetine Hcl     REACTION: jumpy  . Lovastatin   . Mometasone Furo-Formoterol Fum     Loss of appetite, laryngitis   . Sulfa Antibiotics     Current Outpatient Prescriptions on File Prior to Visit  Medication Sig Dispense Refill  . acetaminophen (TYLENOL) 500 MG tablet Take 500 mg by mouth every 6 (six) hours as needed.    Marland Kitchen albuterol (PROVENTIL HFA;VENTOLIN HFA) 108 (90 BASE) MCG/ACT inhaler Inhale 2 puffs into the lungs every 6 (six) hours as needed for wheezing or shortness of breath. 1 Inhaler 0  . aspirin EC 325 MG tablet Take 325 mg by mouth as needed.    . Cholecalciferol (VITAMIN D3) 1000 UNITS CAPS Take by mouth daily. Take total of 2 daily     . clonazepam (KLONOPIN) 0.125 MG disintegrating tablet Take 1 tablet (0.125 mg total) by mouth 2 (two) times daily as needed (anxiety). 60 tablet 1  . CRANBERRY PO Take  by mouth daily.    . Erythromycin 2 % PADS Use qd on face 30 each 5  . fluticasone (FLONASE) 50 MCG/ACT nasal spray Place 1 spray into the nose daily as needed.    Marland Kitchen guaiFENesin (MUCINEX) 600 MG 12 hr tablet Take by mouth 2 (two) times daily.    Marland Kitchen levofloxacin (LEVAQUIN) 500 MG tablet Take 1 tablet (500 mg total) by mouth daily. 7 tablet 0   No current facility-administered medications on file prior to visit.   Past Medical History  Diagnosis Date  . Osteopenia   . Anxiety   . Pneumonia     hx of 03/2008    Review of Systems    See HPI Objective:    BP 122/64 mmHg  Pulse 64  Temp(Src) 98.3 F (36.8 C) (Oral)  Resp 18  Ht 5' 2.5" (1.588 m)  Wt 110 lb 12.8 oz (50.259 kg)  BMI 19.93 kg/m2  SpO2 97% Nursing note and vital signs reviewed.  Physical Exam  Constitutional: She is oriented to person, place, and time. She appears well-developed and well-nourished. No distress.  Cardiovascular: Normal rate, regular rhythm, normal heart sounds and intact distal pulses.   Pulmonary/Chest: Effort normal and breath sounds normal.  Musculoskeletal:  No obvious deformity, discoloration, or edema noted of left hand. No palpable tenderness. Decreased sensation along the ulnar  nerve distribution. Decreased palmar interossei muscle function of third through fifth fingers. Grip strength is subjectively decreased in the left compared to the right. Pulses are intact and appropriate bilaterally. Negative Tinel's sign. Negative Phalen sign.  Neurological: She is alert and oriented to person, place, and time.  Skin: Skin is warm and dry.  Psychiatric: She has a normal mood and affect. Her behavior is normal. Judgment and thought content normal.       Assessment & Plan:

## 2014-04-19 NOTE — Assessment & Plan Note (Addendum)
Numbness of left hand with questionable etiology. X-rays of left hand and cervical spine were negative per patient. Cannot rule out nerve inflammation or potential stroke. Start methylprednisolone Dosepak. Obtain MRI head to rule out stroke. Refer to neurology for further assessment. Patient displays adequate function at this time will hold occupational therapy at the moment Follow up if symptoms worsen or fail to improve.

## 2014-04-19 NOTE — Progress Notes (Signed)
Pre visit review using our clinic review tool, if applicable. No additional management support is needed unless otherwise documented below in the visit note. 

## 2014-04-19 NOTE — Patient Instructions (Signed)
Thank you for choosing [facility].  Summary/Instructions:  Your prescription(s) have been submitted to your pharmacy. Please take as directed and contact our office if you believe you are having problem(s) with the medication(s).  If your symptoms worsen or fail to improve, please contact our office for further instruction, or in case of emergency go directly to the emergency room at the closest medical facility.

## 2014-04-21 ENCOUNTER — Encounter: Payer: Self-pay | Admitting: Internal Medicine

## 2014-04-25 ENCOUNTER — Ambulatory Visit
Admission: RE | Admit: 2014-04-25 | Discharge: 2014-04-25 | Disposition: A | Discharge status: Home / Self Care | Payer: Medicare Other | Source: Ambulatory Visit | Attending: Family | Admitting: Family

## 2014-04-25 DIAGNOSIS — R42 Dizziness and giddiness: Secondary | ICD-10-CM | POA: Diagnosis not present

## 2014-04-25 DIAGNOSIS — R200 Anesthesia of skin: Secondary | ICD-10-CM

## 2014-04-25 DIAGNOSIS — R531 Weakness: Secondary | ICD-10-CM | POA: Diagnosis not present

## 2014-04-26 ENCOUNTER — Encounter: Payer: Self-pay | Admitting: Family

## 2014-05-04 ENCOUNTER — Telehealth: Payer: Self-pay | Admitting: Internal Medicine

## 2014-05-04 NOTE — Telephone Encounter (Signed)
Pt states she never received email from [facility] regarding MRI, she wants a call as she cant find in her mychart.  [phone]

## 2014-05-04 NOTE — Telephone Encounter (Signed)
MRI results were sent on 12/28 and according to MyChart have been read.

## 2014-05-05 NOTE — Telephone Encounter (Signed)
I called pt regarding neurology referral. She states she is not sure that she still needs this since stroke was ruled out in MRI. Patient would like to know if Dr. Alain Marion thinks she still needs to see a neurologist. She states her numbness is a little better, but her pinky finger "still won't cooperate." Please advise pt.

## 2014-05-06 NOTE — Telephone Encounter (Signed)
Notified pt with md response. Inform pt Julie Montgomery) will give her a call back with referral appt...[name]

## 2014-05-06 NOTE — Telephone Encounter (Signed)
Please, keep Neurology appt Thx

## 2014-05-07 NOTE — Telephone Encounter (Signed)
Patient has appt made with Dr. Doy Mince in April. I have called her multiple times today to inform her. Phone was busy or kept ringing. Will try again on Monday.

## 2014-05-10 NOTE — Telephone Encounter (Signed)
Pt aware of appt.

## 2014-06-23 ENCOUNTER — Encounter: Payer: Self-pay | Admitting: Family Medicine

## 2014-06-23 ENCOUNTER — Ambulatory Visit (INDEPENDENT_AMBULATORY_CARE_PROVIDER_SITE_OTHER): Payer: Medicare Other | Admitting: Family Medicine

## 2014-06-23 VITALS — BP 108/70 | HR 71 | Temp 98.0°F | Ht 62.5 in | Wt 108.9 lb

## 2014-06-23 DIAGNOSIS — J069 Acute upper respiratory infection, unspecified: Secondary | ICD-10-CM | POA: Diagnosis not present

## 2014-06-23 MED ORDER — BENZONATATE 100 MG PO CAPS: 100.0000 mg | ORAL_CAPSULE | Freq: Two times a day (BID) | ORAL | Status: DC | PRN

## 2014-06-23 NOTE — Patient Instructions (Signed)

## 2014-06-23 NOTE — Progress Notes (Signed)
HPI:  URI: -started: 2 days ago -symptoms:nasal congestion, PND, cough, laryngitis -denies:fever, SOB, NVD, tooth pain, sinus pain -has tried: tylenol, musinex -sick contacts/travel/risks: denies flu exposure, tick exposure or or Ebola risks  ROS: See pertinent positives and negatives per HPI.  Past Medical History  Diagnosis Date  . Osteopenia   . Anxiety   . Pneumonia     hx of 03/2008    Past Surgical History  Procedure Laterality Date  . Partial hysterectomy    . Breast lumpectomy      right  . Appendectomy    . Cesarean section      Family History  Problem Relation Age of Onset  . Diabetes Sister   . Cancer Sister     lung ca  . Coronary artery disease Brother   . Dementia Brother   . Cancer Brother     lung ca    History   Social History  . Marital Status: Married    Spouse Name: N/A  . Number of Children: N/A  . Years of Education: N/A   Occupational History  . Substitute Teacher    Social History Main Topics  . Smoking status: Never Smoker   . Smokeless tobacco: Never Used  . Alcohol Use: No  . Drug Use: No  . Sexual Activity: Not on file   Other Topics Concern  . None   Social History Narrative   Married, Retired, she was looking after her GGD, now in court battle     Current outpatient prescriptions:  .  acetaminophen (TYLENOL) 500 MG tablet, Take 500 mg by mouth every 6 (six) hours as needed., Disp: , Rfl:  .  aspirin EC 325 MG tablet, Take 325 mg by mouth as needed., Disp: , Rfl:  .  Cholecalciferol (VITAMIN D3) 1000 UNITS CAPS, Take by mouth daily. Take total of 2 daily , Disp: , Rfl:  .  CRANBERRY PO, Take by mouth daily., Disp: , Rfl:  .  fluticasone (FLONASE) 50 MCG/ACT nasal spray, Place 1 spray into the nose daily as needed., Disp: , Rfl:  .  guaiFENesin (MUCINEX) 600 MG 12 hr tablet, Take by mouth 2 (two) times daily., Disp: , Rfl:  .  benzonatate (TESSALON) 100 MG capsule, Take 1 capsule (100 mg total) by mouth 2 (two)  times daily as needed for cough., Disp: 20 capsule, Rfl: 0  EXAM:  Filed Vitals:   06/23/14 1054  BP: 108/70  Pulse: 71  Temp: 98 F (36.7 C)    Body mass index is 19.59 kg/(m^2).  GENERAL: vitals reviewed and listed above, alert, oriented, appears well hydrated and in no acute distress  HEENT: atraumatic, conjunttiva clear, no obvious abnormalities on inspection of external nose and ears, normal appearance of ear canals and TMs, clear nasal congestion, mild post oropharyngeal erythema with PND, no tonsillar edema or exudate, no sinus TTP  NECK: no obvious masses on inspection  LUNGS: clear to auscultation bilaterally, no wheezes, rales or rhonchi, good air movement  CV: HRRR, no peripheral edema  MS: moves all extremities without noticeable abnormality  PSYCH: pleasant and cooperative, no obvious depression or anxiety  ASSESSMENT AND PLAN:  Discussed the following assessment and plan:  Acute upper respiratory infection - Plan: benzonatate (TESSALON) 100 MG capsule  -given HPI and exam findings today, a serious infection or illness is unlikely. We discussed potential etiologies, with VURI being most likely, and advised supportive care and monitoring. We discussed treatment side effects, likely course, antibiotic  misuse, transmission, and signs of developing a serious illness. -of course, we advised to return or notify a doctor immediately if symptoms worsen or persist or new concerns arise.    There are no Patient Instructions on file for this visit.   Colin Benton R.

## 2014-06-23 NOTE — Progress Notes (Signed)
Pre visit review using our clinic review tool, if applicable. No additional management support is needed unless otherwise documented below in the visit note. 

## 2014-07-06 ENCOUNTER — Ambulatory Visit (INDEPENDENT_AMBULATORY_CARE_PROVIDER_SITE_OTHER): Payer: Medicare Other | Admitting: Internal Medicine

## 2014-07-06 ENCOUNTER — Telehealth: Payer: Self-pay | Admitting: Internal Medicine

## 2014-07-06 ENCOUNTER — Encounter: Payer: Self-pay | Admitting: Internal Medicine

## 2014-07-06 VITALS — BP 130/78 | HR 70 | Temp 97.9°F | Wt 110.0 lb

## 2014-07-06 DIAGNOSIS — R634 Abnormal weight loss: Secondary | ICD-10-CM

## 2014-07-06 DIAGNOSIS — R208 Other disturbances of skin sensation: Secondary | ICD-10-CM

## 2014-07-06 DIAGNOSIS — E7800 Pure hypercholesterolemia, unspecified: Secondary | ICD-10-CM

## 2014-07-06 DIAGNOSIS — R200 Anesthesia of skin: Secondary | ICD-10-CM

## 2014-07-06 NOTE — Telephone Encounter (Signed)
Patient has a Neurology appointment in April and she wants to know if she needs to keep that appointment or not.

## 2014-07-06 NOTE — Assessment & Plan Note (Signed)
Better  Neurol eval is scheduled

## 2014-07-06 NOTE — Assessment & Plan Note (Signed)
Wt Readings from Last 3 Encounters:  07/06/14 110 lb (49.896 kg)  06/23/14 108 lb 14.4 oz (49.397 kg)  04/19/14 110 lb 12.8 oz (50.259 kg)

## 2014-07-06 NOTE — Assessment & Plan Note (Signed)
On a good diet in general

## 2014-07-06 NOTE — Progress Notes (Signed)
Pre visit review using our clinic review tool, if applicable. No additional management support is needed unless otherwise documented below in the visit note. 

## 2014-07-07 NOTE — Telephone Encounter (Signed)
Patient notified to keep Neurology appointment.

## 2014-07-07 NOTE — Telephone Encounter (Signed)
pls keep Neurology appt Thx

## 2014-08-10 DIAGNOSIS — R29898 Other symptoms and signs involving the musculoskeletal system: Secondary | ICD-10-CM | POA: Insufficient documentation

## 2014-08-10 DIAGNOSIS — M6289 Other specified disorders of muscle: Secondary | ICD-10-CM | POA: Diagnosis not present

## 2014-08-25 ENCOUNTER — Other Ambulatory Visit: Payer: Self-pay

## 2014-08-25 DIAGNOSIS — Z1231 Encounter for screening mammogram for malignant neoplasm of breast: Secondary | ICD-10-CM

## 2014-09-08 ENCOUNTER — Other Ambulatory Visit: Payer: Self-pay | Admitting: Obstetrics and Gynecology

## 2014-09-08 ENCOUNTER — Other Ambulatory Visit: Payer: Federal, State, Local not specified - PPO

## 2014-09-08 DIAGNOSIS — R102 Pelvic and perineal pain: Secondary | ICD-10-CM

## 2014-09-08 DIAGNOSIS — R319 Hematuria, unspecified: Secondary | ICD-10-CM | POA: Diagnosis not present

## 2014-09-09 ENCOUNTER — Ambulatory Visit
Admission: RE | Admit: 2014-09-09 | Discharge: 2014-09-09 | Disposition: A | Discharge status: Home / Self Care | Payer: Medicare Other | Source: Ambulatory Visit | Attending: Obstetrics and Gynecology | Admitting: Obstetrics and Gynecology

## 2014-09-09 ENCOUNTER — Other Ambulatory Visit: Payer: Federal, State, Local not specified - PPO

## 2014-09-09 ENCOUNTER — Encounter: Payer: Self-pay | Admitting: Internal Medicine

## 2014-09-09 DIAGNOSIS — I728 Aneurysm of other specified arteries: Secondary | ICD-10-CM | POA: Diagnosis not present

## 2014-09-09 DIAGNOSIS — R102 Pelvic and perineal pain: Secondary | ICD-10-CM

## 2014-09-09 DIAGNOSIS — K5730 Diverticulosis of large intestine without perforation or abscess without bleeding: Secondary | ICD-10-CM | POA: Diagnosis not present

## 2014-09-09 DIAGNOSIS — K5900 Constipation, unspecified: Secondary | ICD-10-CM | POA: Diagnosis not present

## 2014-09-09 MED ORDER — IOHEXOL 300 MG/ML  SOLN
100.0000 mL | Freq: Once | INTRAMUSCULAR | Status: AC | PRN
  Administered 2014-09-09: 100 mL via INTRAVENOUS

## 2014-09-12 ENCOUNTER — Other Ambulatory Visit: Payer: Self-pay | Admitting: Internal Medicine

## 2014-09-12 DIAGNOSIS — K5730 Diverticulosis of large intestine without perforation or abscess without bleeding: Secondary | ICD-10-CM

## 2014-09-15 ENCOUNTER — Ambulatory Visit
Admission: RE | Admit: 2014-09-15 | Discharge: 2014-09-15 | Disposition: A | Discharge status: Home / Self Care | Payer: Medicare Other | Source: Ambulatory Visit

## 2014-09-15 ENCOUNTER — Encounter: Payer: Self-pay | Admitting: Physician Assistant

## 2014-09-15 DIAGNOSIS — Z1231 Encounter for screening mammogram for malignant neoplasm of breast: Secondary | ICD-10-CM

## 2014-09-29 ENCOUNTER — Emergency Department (HOSPITAL_COMMUNITY)
Admission: EM | Admit: 2014-09-29 | Discharge: 2014-09-29 | Disposition: A | Discharge status: Home / Self Care | Payer: Medicare Other | Attending: Emergency Medicine | Admitting: Emergency Medicine

## 2014-09-29 ENCOUNTER — Telehealth: Payer: Self-pay | Admitting: Gastroenterology

## 2014-09-29 ENCOUNTER — Encounter (HOSPITAL_COMMUNITY): Payer: Self-pay | Admitting: *Deleted

## 2014-09-29 ENCOUNTER — Ambulatory Visit (INDEPENDENT_AMBULATORY_CARE_PROVIDER_SITE_OTHER): Payer: Medicare Other | Admitting: Physician Assistant

## 2014-09-29 ENCOUNTER — Encounter: Payer: Self-pay | Admitting: Physician Assistant

## 2014-09-29 ENCOUNTER — Other Ambulatory Visit (INDEPENDENT_AMBULATORY_CARE_PROVIDER_SITE_OTHER): Payer: Medicare Other

## 2014-09-29 VITALS — BP 108/70 | HR 88 | Temp 98.4°F | Ht 62.5 in | Wt 106.6 lb

## 2014-09-29 DIAGNOSIS — R1031 Right lower quadrant pain: Secondary | ICD-10-CM

## 2014-09-29 DIAGNOSIS — Z79899 Other long term (current) drug therapy: Secondary | ICD-10-CM | POA: Diagnosis not present

## 2014-09-29 DIAGNOSIS — R197 Diarrhea, unspecified: Secondary | ICD-10-CM | POA: Diagnosis not present

## 2014-09-29 DIAGNOSIS — R1110 Vomiting, unspecified: Secondary | ICD-10-CM | POA: Diagnosis present

## 2014-09-29 DIAGNOSIS — R1032 Left lower quadrant pain: Secondary | ICD-10-CM

## 2014-09-29 DIAGNOSIS — Z8659 Personal history of other mental and behavioral disorders: Secondary | ICD-10-CM | POA: Insufficient documentation

## 2014-09-29 DIAGNOSIS — R112 Nausea with vomiting, unspecified: Secondary | ICD-10-CM

## 2014-09-29 DIAGNOSIS — R935 Abnormal findings on diagnostic imaging of other abdominal regions, including retroperitoneum: Secondary | ICD-10-CM

## 2014-09-29 DIAGNOSIS — Z8701 Personal history of pneumonia (recurrent): Secondary | ICD-10-CM | POA: Insufficient documentation

## 2014-09-29 DIAGNOSIS — Z7982 Long term (current) use of aspirin: Secondary | ICD-10-CM | POA: Diagnosis not present

## 2014-09-29 DIAGNOSIS — Z8739 Personal history of other diseases of the musculoskeletal system and connective tissue: Secondary | ICD-10-CM | POA: Insufficient documentation

## 2014-09-29 LAB — CBC WITH DIFFERENTIAL/PLATELET
Basophils Absolute: 0 10*3/uL (ref 0.0–0.1)
Basophils Relative: 0.2 % (ref 0.0–3.0)
Eosinophils Absolute: 0 10*3/uL (ref 0.0–0.7)
Eosinophils Relative: 0.1 % (ref 0.0–5.0)
HCT: 39.6 % (ref 36.0–46.0)
Hemoglobin: 13.4 g/dL (ref 12.0–15.0)
Lymphocytes Relative: 3 % — ABNORMAL LOW (ref 12.0–46.0)
Lymphs Abs: 0.4 10*3/uL — ABNORMAL LOW (ref 0.7–4.0)
MCHC: 33.9 g/dL (ref 30.0–36.0)
MCV: 92.4 fl (ref 78.0–100.0)
MONO ABS: 0.6 10*3/uL (ref 0.1–1.0)
Monocytes Relative: 4 % (ref 3.0–12.0)
Neutro Abs: 13.6 10*3/uL — ABNORMAL HIGH (ref 1.4–7.7)
Platelets: 233 10*3/uL (ref 150.0–400.0)
RBC: 4.28 Mil/uL (ref 3.87–5.11)
RDW: 13.7 % (ref 11.5–15.5)
WBC: 14.6 10*3/uL — AB (ref 4.0–10.5)

## 2014-09-29 LAB — SEDIMENTATION RATE: Sed Rate: 12 mm/hr (ref 0–22)

## 2014-09-29 LAB — URINALYSIS, ROUTINE W REFLEX MICROSCOPIC
Bilirubin Urine: NEGATIVE
GLUCOSE, UA: NEGATIVE mg/dL
Hgb urine dipstick: NEGATIVE
Ketones, ur: NEGATIVE mg/dL
LEUKOCYTES UA: NEGATIVE
NITRITE: NEGATIVE
Protein, ur: NEGATIVE mg/dL
Specific Gravity, Urine: 1.026 (ref 1.005–1.030)
Urobilinogen, UA: 0.2 mg/dL (ref 0.0–1.0)
pH: 5 (ref 5.0–8.0)

## 2014-09-29 LAB — BASIC METABOLIC PANEL
BUN: 22 mg/dL (ref 6–23)
CALCIUM: 9.2 mg/dL (ref 8.4–10.5)
CO2: 27 mEq/L (ref 19–32)
CREATININE: 0.68 mg/dL (ref 0.40–1.20)
Chloride: 105 mEq/L (ref 96–112)
GFR: 89.28 mL/min (ref >60.00)
Glucose, Bld: 125 mg/dL — ABNORMAL HIGH (ref 70–99)
Potassium: 3.8 mEq/L (ref 3.5–5.1)
SODIUM: 137 meq/L (ref 135–145)

## 2014-09-29 LAB — C-REACTIVE PROTEIN: CRP: 1.2 mg/dL (ref 0.5–20.0)

## 2014-09-29 MED ORDER — ONDANSETRON HCL 4 MG/2ML IJ SOLN
4.0000 mg | Freq: Once | INTRAMUSCULAR | Status: AC
  Administered 2014-09-29: 4 mg via INTRAVENOUS
  Filled 2014-09-29: qty 2

## 2014-09-29 MED ORDER — SODIUM CHLORIDE 0.9 % IV BOLUS (SEPSIS)
500.0000 mL | Freq: Once | INTRAVENOUS | Status: AC
  Administered 2014-09-29: 500 mL via INTRAVENOUS

## 2014-09-29 MED ORDER — SACCHAROMYCES BOULARDII 250 MG PO CAPS: 250.0000 mg | ORAL_CAPSULE | Freq: Two times a day (BID) | ORAL | Status: DC

## 2014-09-29 MED ORDER — NA SULFATE-K SULFATE-MG SULF 17.5-3.13-1.6 GM/177ML PO SOLN: 1.0000 | Freq: Once | ORAL | Status: AC

## 2014-09-29 MED ORDER — DICYCLOMINE HCL 10 MG/ML IM SOLN
10.0000 mg | Freq: Once | INTRAMUSCULAR | Status: AC
  Administered 2014-09-29: 10 mg via INTRAMUSCULAR
  Filled 2014-09-29: qty 2

## 2014-09-29 MED ORDER — ONDANSETRON HCL 8 MG PO TABS: 8.0000 mg | ORAL_TABLET | Freq: Three times a day (TID) | ORAL | Status: DC | PRN

## 2014-09-29 MED ORDER — DICYCLOMINE HCL 10 MG PO CAPS: 10.0000 mg | ORAL_CAPSULE | Freq: Three times a day (TID) | ORAL | Status: DC

## 2014-09-29 MED ORDER — SODIUM CHLORIDE 0.9 % IV SOLN
INTRAVENOUS | Status: DC
  Administered 2014-09-29: 17:00:00 via INTRAVENOUS

## 2014-09-29 MED ORDER — ACETAMINOPHEN 325 MG PO TABS
650.0000 mg | ORAL_TABLET | Freq: Once | ORAL | Status: AC
Start: 2014-09-29 — End: 2014-09-29
  Administered 2014-09-29: 650 mg via ORAL
  Filled 2014-09-29: qty 2

## 2014-09-29 NOTE — Progress Notes (Signed)
Reviewed and agree with management plan.  Marice Angelino T. Semone Orlov, MD FACG 

## 2014-09-29 NOTE — Telephone Encounter (Signed)
Patient states she cannot do her prep. She cannot "even keep water down" but declines medication for nausea. She stresses that she is mostly worried about her legs and back pain. She "cannot keep (her) legs still" and they "jumping". Her back pain is "unbearable" and she feels these things are the most concerning thing to her right now. Offered medication for nausea. She declines. Tylenol did not help with her pain. She is at home alone but states her husband will be home soon and she will go to the ER. Advised she go to [facility]. She also asks that I cancel the colonoscopy she is scheduled for tomorrow. Amy Esterwood aware of this.

## 2014-09-29 NOTE — Patient Instructions (Addendum)
Please go to the basement level to have your labs drawn.  We sent prescriptions to [facility]. Bent[facility] ( Dicyclomine)   You have been scheduled for a colonoscopy. Please follow written instructions given to you at your visit today.  Please pick up your prep supplies at the pharmacy within the next 1-3 days. If you use inhalers (even only as needed), please bring them with you on the day of your procedure.

## 2014-09-29 NOTE — ED Notes (Signed)
Pt states this started 3 weeks ago and is unable to drink go litely for colonoscopy tomorrow.

## 2014-09-29 NOTE — Progress Notes (Signed)
Patient ID: Julie Montgomery, female   DOB: June 29, 1935, 79 y.o.   MRN: [id number]   Subjective:    Patient ID: Julie Montgomery, female    DOB: 05-03-1935, 79 y.o.   MRN: [id number]  HPI Julie Montgomery is a pleasant 79 year old white female new to GI today referred by Dr. Alain Marion for evaluation of lower abdominal pain diarrhea and abnormal CT scan. Patient believes she had a colonoscopy 10-12 years ago in [place] that was normal but does not remember where this was done. She did a Cologuard last year which was negative. Patient states that over the past year he has had progressively worsening problems with her bowel movements. She says she's really not had a normal formed bowel movement in a year. She is now having 12 or more bowel movements per day. He says her stools are always mushy or loose. She is not aware of any melena or hematochezia. She has developed lower abdominal discomfort and some associated low back pain. He has episodes of urgency and has had some incontinence. She's also had nocturnal episodes of diarrhea. No fevers chills or sweats. Her appetite is fair her weight is down a few pounds over the past few months. She had taken a course of an antibiotic this past winter for respiratory infection, no other new meds supplements etc. No travel or known infectious exposures She had seen her gynecologist Dr. Cletis Media a few weeks back because of the low abdominal/pelvic pain and CT of the abdomen and pelvis was ordered. This shows patient to be status post cholecystectomy ,she has a 1.5 cm splenic artery aneurysm which is been stable since 2012 and was noted to have mild colonic wall thickening in the sigmoid colon in an area where there is some diverticulosis and a question was raised whether she could have very mild diverticulitis. She was given a course of Cipro and Flagyl which she has completed and says that it did nothing for her symptoms and possibly made her diarrhea worse.  Review of Systems  Pertinent positive and negative review of systems were noted in the above HPI section.  All other review of systems was otherwise negative.  Outpatient Encounter Prescriptions as of 09/29/2014  Medication Sig  . acetaminophen (TYLENOL) 500 MG tablet Take 500 mg by mouth every 6 (six) hours as needed.  Marland Kitchen aspirin EC 325 MG tablet Take 325 mg by mouth as needed.  . Cholecalciferol (VITAMIN D3) 1000 UNITS CAPS Take by mouth daily. Take total of 2 daily   . guaiFENesin (MUCINEX) 600 MG 12 hr tablet Take by mouth 2 (two) times daily.  Marland Kitchen dicyclomine (BENTYL) 10 MG capsule Take 1 capsule (10 mg total) by mouth 4 (four) times daily -  before meals and at bedtime.  . Na Sulfate-K Sulfate-Mg Sulf SOLN Take 1 kit by mouth once.  . saccharomyces boulardii (FLORASTOR) 250 MG capsule Take 1 capsule (250 mg total) by mouth 2 (two) times daily.  . [DISCONTINUED] benzonatate (TESSALON) 100 MG capsule Take 1 capsule (100 mg total) by mouth 2 (two) times daily as needed for cough. (Patient not taking: Reported on 07/06/2014)  . [DISCONTINUED] CRANBERRY PO Take by mouth daily.  . [DISCONTINUED] fluticasone (FLONASE) 50 MCG/ACT nasal spray Place 1 spray into the nose daily as needed.   No facility-administered encounter medications on file as of 09/29/2014.   Allergies  Allergen Reactions  . Fluoxetine Hcl     REACTION: jumpy  . Lovastatin   . Mometasone Furo-Formoterol Fum  Loss of appetite, laryngitis   . Sulfa Antibiotics    Patient Active Problem List   Diagnosis Date Noted  . Numbness of left hand 04/19/2014  . Dysuria 03/19/2012  . LBP (low back pain) 03/19/2012  . Lipoma of arm 01/23/2012  . Impacted cerumen of both ears 05/25/2011  . Otitis externa 05/25/2011  . URI, acute 04/05/2011  . Well adult exam 04/03/2011  . Edema 02/13/2011  . Shoulder pain, right 01/30/2011  . Bronchiectasis 01/23/2011  . Vertigo 09/13/2010  . Allergic rhinitis 09/13/2010  . CONSTIPATION 03/22/2010  . LUMBAGO  03/22/2010  . URINARY FREQUENCY, CHRONIC 03/22/2010  . Situational mixed anxiety and depressive disorder 02/25/2008  . Cough 02/17/2008  . Anxiety state, unspecified 02/07/2007  . INSOMNIA-SLEEP DISORDER-UNSPEC 02/07/2007  . WEIGHT LOSS, ABNORMAL 02/07/2007  . HYPERCHOLESTEROLEMIA, PURE 11/21/2006  . OSTEOPENIA 11/21/2006  . ALLERGY 11/21/2006   History   Social History  . Marital Status: Married    Spouse Name: N/A  . Number of Children: 4  . Years of Education: N/A   Occupational History  . Substitute Teacher    Social History Main Topics  . Smoking status: Never Smoker   . Smokeless tobacco: Never Used  . Alcohol Use: No  . Drug Use: No  . Sexual Activity: Not on file   Other Topics Concern  . Not on file   Social History Narrative   Married, Retired, she was looking after her GGD, now in court battle    Ms. Rease family history includes Cancer in her brother and sister; Coronary artery disease in her brother; Dementia in her brother; Diabetes in her sister.      Objective:    Filed Vitals:   09/29/14 0948  BP: 108/70  Pulse: 88  Temp: 98.4 F (36.9 C)    Physical Exam   Well-developed thin elderly white female in no acute distress, blood pressure 108/70 pulse 88 temperature 98.4 height 5 foot 2 weight 106. HEENT ;nontraumatic normocephalic EOMI PERRLA sclera anicteric, Neck; supple no JVD, Cardiovascular ;regular rate and rhythm with S1-S2 no murmur or gallop, Pulmonary ;clear bilaterally, Abdomen; soft flat primarily tender in the left lower and left mid quadrant there is no guarding or rebound no palpable mass or hepatosplenomegaly bowel sounds are present, Rectal ;exam not done, Extremities ;no clubbing cyanosis or edema skin warm and dry, Psych; mood and affect appropriate      Assessment & Plan:   #1 79 yo female with bilateral ( left  greater than right) lower abdominal pain, progressive diarrhea over several  months and abnormal CT scan. She has  completed a course of Cipro and Flagyl with no improvement in sxs, and therefore suspect she has a colitis rather than diverticultis accounting for her symptoms. R/O cdiff, ulcerative colitis , Crohns, possible SCAD #2 anxiety #3 Bronchiectasis  Plan; GI pathogen panel, cbc,BMET, ESR,CRP Start bentyl 10 mg ac and hs Start Florastor BID  Schedule for Colonoscopy with Dr Fuller Plan- procedure discussed in detail with pt and she is agreeable to proceed.  Hazelene Doten S Zebedee Segundo PA-C 09/29/2014   Cc: Plotnikov, Evie Lacks, MD

## 2014-09-29 NOTE — Discharge Instructions (Signed)
Nausea and Vomiting °Nausea means you feel sick to your stomach. Throwing up (vomiting) is a reflex where stomach contents come out of your mouth. °HOME CARE  °· Take medicine as told by your doctor. °· Do not force yourself to eat. However, you do need to drink fluids. °· If you feel like eating, eat a normal diet as told by your doctor. °· Eat rice, wheat, potatoes, bread, lean meats, yogurt, fruits, and vegetables. °· Avoid high-fat foods. °· Drink enough fluids to keep your pee (urine) clear or pale yellow. °· Ask your doctor how to replace body fluid losses (rehydrate). Signs of body fluid loss (dehydration) include: °· Feeling very thirsty. °· Dry lips and mouth. °· Feeling dizzy. °· Dark pee. °· Peeing less than normal. °· Feeling confused. °· Fast breathing or heart rate. °GET HELP RIGHT AWAY IF:  °· You have blood in your throw up. °· You have black or bloody poop (stool). °· You have a bad headache or stiff neck. °· You feel confused. °· You have bad belly (abdominal) pain. °· You have chest pain or trouble breathing. °· You do not pee at least once every 8 hours. °· You have cold, clammy skin. °· You keep throwing up after 24 to 48 hours. °· You have a fever. °MAKE SURE YOU:  °· Understand these instructions. °· Will watch your condition. °· Will get help right away if you are not doing well or get worse. °Document Released: 10/03/2007 Document Revised: 07/09/2011 Document Reviewed: 09/15/2010 °ExitCare® Patient Information ©2015 [employer]. This information is not intended to replace advice given to you by your health care provider. Make sure you discuss any questions you have with your health care provider. ° °Diarrhea °Diarrhea is frequent loose and watery bowel movements. It can cause you to feel weak and dehydrated. Dehydration can cause you to become tired and thirsty, have a dry mouth, and have decreased urination that often is dark yellow. Diarrhea is a sign of another problem, most often an  infection that will not last long. In most cases, diarrhea typically lasts 2-3 days. However, it can last longer if it is a sign of something more serious. It is important to treat your diarrhea as directed by your caregiver to lessen or prevent future episodes of diarrhea. °CAUSES  °Some common causes include: °· Gastrointestinal infections caused by viruses, bacteria, or parasites. °· Food poisoning or food allergies. °· Certain medicines, such as antibiotics, chemotherapy, and laxatives. °· Artificial sweeteners and fructose. °· Digestive disorders. °HOME CARE INSTRUCTIONS °· Ensure adequate fluid intake (hydration): Have 1 cup (8 oz) of fluid for each diarrhea episode. Avoid fluids that contain simple sugars or sports drinks, fruit juices, whole milk products, and sodas. Your urine should be clear or pale yellow if you are drinking enough fluids. Hydrate with an oral rehydration solution that you can purchase at pharmacies, retail stores, and online. You can prepare an oral rehydration solution at home by mixing the following ingredients together: °¨  - tsp table salt. °¨ ¾ tsp baking soda. °¨  tsp salt substitute containing potassium chloride. °¨ 1  tablespoons sugar. °¨ 1 L (34 oz) of water. °· Certain foods and beverages may increase the speed at which food moves through the gastrointestinal (GI) tract. These foods and beverages should be avoided and include: °¨ Caffeinated and alcoholic beverages. °¨ High-fiber foods, such as raw fruits and vegetables, nuts, seeds, and whole grain breads and cereals. °¨ Foods and beverages sweetened with   sugar alcohols, such as xylitol, sorbitol, and mannitol. °· Some foods may be well tolerated and may help thicken stool including: °¨ Starchy foods, such as rice, toast, pasta, low-sugar cereal, oatmeal, grits, baked potatoes, crackers, and bagels. °¨ Bananas. °¨ Applesauce. °· Add probiotic-rich foods to help increase healthy bacteria in the GI tract, such as yogurt and  fermented milk products. °· Wash your hands well after each diarrhea episode. °· Only take over-the-counter or prescription medicines as directed by your caregiver. °· Take a warm bath to relieve any burning or pain from frequent diarrhea episodes. °SEEK IMMEDIATE MEDICAL CARE IF:  °· You are unable to keep fluids down. °· You have persistent vomiting. °· You have blood in your stool, or your stools are black and tarry. °· You do not urinate in 6-8 hours, or there is only a small amount of very dark urine. °· You have abdominal pain that increases or localizes. °· You have weakness, dizziness, confusion, or light-headedness. °· You have a severe headache. °· Your diarrhea gets worse or does not get better. °· You have a fever or persistent symptoms for more than 2-3 days. °· You have a fever and your symptoms suddenly get worse. °MAKE SURE YOU:  °· Understand these instructions. °· Will watch your condition. °· Will get help right away if you are not doing well or get worse. °Document Released: 04/06/2002 Document Revised: 08/31/2013 Document Reviewed: 12/23/2011 °ExitCare® Patient Information ©2015 [employer]. This information is not intended to replace advice given to you by your health care provider. Make sure you discuss any questions you have with your health care provider. ° °

## 2014-09-29 NOTE — ED Provider Notes (Signed)
CSN: [id number]     Arrival date & time 09/29/14  16 History   First MD Initiated Contact with Patient 09/29/14 1604     Chief Complaint  Patient presents with  . Diarrhea  . Emesis     (Consider location/radiation/quality/duration/timing/severity/associated sxs/prior Treatment) HPI   Julie Montgomery is a 79 y.o. female who presents complaining of periods of vomiting and diarrhea that cause weakness and dizziness. She saw her gastroenterologist, APP, today, and was set up for colonoscopy, tomorrow. She was prescribed Bentyl and Florastar, which she has not begun taking yet. She tried to start drinking the colon prep, but began to vomit, so stopped it. She came here because she was uncomfortable. She has had diarrhea for 3 weeks, which is why she was referred to GI. She had a recent CT showing left-sided colon thickening with possible diverticulitis. Because of that. She was given Flagyl and Cipro. These medicines have not helped her symptoms. GI plans colon scope to evaluate for other types of colitis. Patient denies fever. She has not eaten any food today. She's never had this problem previously. She states that she is very healthy and does not have any other medical problems. There are no other known modifying factors.   Past Medical History  Diagnosis Date  . Osteopenia   . Anxiety   . Pneumonia     hx of 03/2008   Past Surgical History  Procedure Laterality Date  . Partial hysterectomy    . Breast lumpectomy      right  . Appendectomy    . Cesarean section    . Cholecystectomy    . Rotator cuff repair  2012   Family History  Problem Relation Age of Onset  . Diabetes Sister   . Cancer Sister     lung ca  . Coronary artery disease Brother   . Dementia Brother   . Cancer Brother     lung ca   History  Substance Use Topics  . Smoking status: Never Smoker   . Smokeless tobacco: Never Used  . Alcohol Use: No   OB History    No data available     Review of Systems   All other systems reviewed and are negative.     Allergies  Fluoxetine hcl; Lovastatin; Mometasone furo-formoterol fum; and Sulfa antibiotics  Home Medications   Prior to Admission medications   Medication Sig Start Date End Date Taking? Authorizing Provider  acetaminophen (TYLENOL) 500 MG tablet Take 500 mg by mouth every 6 (six) hours as needed.    Historical Provider, MD  aspirin EC 325 MG tablet Take 325 mg by mouth as needed.    Historical Provider, MD  Cholecalciferol (VITAMIN D3) 1000 UNITS CAPS Take by mouth daily. Take total of 2 daily     Historical Provider, MD  dicyclomine (BENTYL) 10 MG capsule Take 1 capsule (10 mg total) by mouth 4 (four) times daily -  before meals and at bedtime. 09/29/14   Amy S Esterwood, PA-C  guaiFENesin (MUCINEX) 600 MG 12 hr tablet Take by mouth 2 (two) times daily.    Historical Provider, MD  Na Sulfate-K Sulfate-Mg Sulf SOLN Take 1 kit by mouth once. 09/29/14 10/29/14  Amy S Esterwood, PA-C  saccharomyces boulardii (FLORASTOR) 250 MG capsule Take 1 capsule (250 mg total) by mouth 2 (two) times daily. 09/29/14   Amy S Esterwood, PA-C   BP 118/60 mmHg  Pulse 91  Temp(Src) 100.4 F (38 C) (Oral)  Resp  18  Ht 5' 2"  (1.575 m)  Wt 106 lb 9.6 oz (48.353 kg)  BMI 19.49 kg/m2  SpO2 96% Physical Exam  Constitutional: She is oriented to person, place, and time. She appears well-developed.  Elderly, slender, frail  HENT:  Head: Normocephalic and atraumatic.  Right Ear: External ear normal.  Left Ear: External ear normal.  Eyes: Conjunctivae and EOM are normal. Pupils are equal, round, and reactive to light.  Neck: Normal range of motion and phonation normal. Neck supple.  Cardiovascular: Normal rate, regular rhythm and normal heart sounds.   Pulmonary/Chest: Effort normal and breath sounds normal. She exhibits no bony tenderness.  Abdominal: Soft. She exhibits no mass. There is no rebound and no guarding.  Hyperactive bowel sounds. Soft. Mild lower  quadrant tenderness bilaterally.  Musculoskeletal: Normal range of motion.  Neurological: She is alert and oriented to person, place, and time. No cranial nerve deficit or sensory deficit. She exhibits normal muscle tone. Coordination normal.  Skin: Skin is warm, dry and intact.  Psychiatric: She has a normal mood and affect. Her behavior is normal. Judgment and thought content normal.  Nursing note and vitals reviewed.   ED Course  Procedures (including critical care time)  Medications  0.9 %  sodium chloride infusion ( Intravenous New Bag/Given 09/29/14 1721)  sodium chloride 0.9 % bolus 500 mL (500 mLs Intravenous New Bag/Given 09/29/14 1720)  ondansetron (ZOFRAN) injection 4 mg (4 mg Intravenous Given 09/29/14 1720)  dicyclomine (BENTYL) injection 10 mg (10 mg Intramuscular Given 09/29/14 1720)    Patient Vitals for the past 24 hrs:  BP Temp Temp src Pulse Resp SpO2 Height Weight  09/29/14 1715 118/60 mmHg - - 91 - 96 % - -  09/29/14 1630 120/61 mmHg - - 88 - 96 % - -  09/29/14 1537 124/67 mmHg 100.4 F (38 C) Oral 90 18 97 % 5' 2"  (1.575 m) 106 lb 9.6 oz (48.353 kg)    20:35 PM Reevaluation with update and discussion. After initial assessment and treatment, an updated evaluation reveals she has tolerated some oral fluids. She feels well enough to go home. She needs anti-emetic med. to use at home. She would like some Tylenol to use for discomfort. Findings discussed with the patient, all questions answered. [name]   BUN  Date Value Ref Range Status  09/29/2014 22 6 - 23 mg/dL Final  03/05/2014 25* 6 - 23 mg/dL Final  05/12/2013 24* 6 - 23 mg/dL Final  05/07/2012 19 6 - 23 mg/dL Final   CREATININE, SER  Date Value Ref Range Status  09/29/2014 0.68 0.40 - 1.20 mg/dL Final  03/05/2014 0.6 0.4 - 1.2 mg/dL Final  05/12/2013 0.8 0.4 - 1.2 mg/dL Final  05/07/2012 0.6 0.4 - 1.2 mg/dL Final    HEMOGLOBIN  Date Value Ref Range Status  09/29/2014 13.4 12.0 - 15.0 g/dL Final   05/12/2013 13.0 12.0 - 15.0 g/dL Final  05/07/2012 12.8 12.0 - 15.0 g/dL Final  04/03/2011 11.8* 12.0 - 15.0 g/dL Final   SODIUM  Date Value Ref Range Status  09/29/2014 137 135 - 145 mEq/L Final  03/05/2014 141 135 - 145 mEq/L Final  05/12/2013 141 135 - 145 mEq/L Final  05/07/2012 140 135 - 145 mEq/L Final   WBC  Date Value Ref Range Status  09/29/2014 14.6* 4.0 - 10.5 K/uL Final  05/12/2013 6.9 4.5 - 10.5 K/uL Final  05/07/2012 6.4 4.5 - 10.5 K/uL Final  04/03/2011 8.8 4.5 - 10.5 K/uL Final  Labs Review Labs Reviewed  URINALYSIS, ROUTINE W REFLEX MICROSCOPIC (NOT AT [facility]) - Abnormal; Notable for the following:    APPearance CLOUDY (*)    All other components within normal limits    Imaging Review No results found.   EKG Interpretation None      MDM   Final diagnoses:  Nausea vomiting and diarrhea    Nonspecific, vomiting and diarrhea. Doubt serious bacterial infection. Metabolic instability or impending vascular collapse. No clear evidence for acute colitis at this time.   Nursing Notes Reviewed/ Care Coordinated Applicable Imaging Reviewed Interpretation of Laboratory Data incorporated into ED treatment  The patient appears reasonably screened and/or stabilized for discharge and I doubt any other medical condition or other [facility] requiring further screening, evaluation, or treatment in the ED at this time prior to discharge.  Plan: Home Medications- usual and as Rx today; Home Treatments- rest, gradually advance diet; return here if the recommended treatment, does not improve the symptoms; Recommended follow up- Call GI in AM for recommendations. PCP prn     Daleen Bo, MD 09/30/14 [phone]

## 2014-09-29 NOTE — ED Notes (Signed)
Pt in c/o diarrhea and vomiting that has been going on for the last three weeks, seen at [facility] for same and was supposed to have a colonoscopy for this today but was too sick to do this, pt states weakness is worse today, also c/o lower back pain and bilateral leg pain, pt appears uncomfortable, had lab work completed at office around 11am today

## 2014-09-30 ENCOUNTER — Encounter: Payer: Medicare Other | Admitting: Gastroenterology

## 2014-09-30 ENCOUNTER — Telehealth: Payer: Self-pay | Admitting: Physician Assistant

## 2014-09-30 ENCOUNTER — Other Ambulatory Visit: Payer: Medicare Other

## 2014-09-30 ENCOUNTER — Other Ambulatory Visit: Payer: Self-pay

## 2014-09-30 DIAGNOSIS — R1031 Right lower quadrant pain: Secondary | ICD-10-CM

## 2014-09-30 DIAGNOSIS — R1032 Left lower quadrant pain: Secondary | ICD-10-CM

## 2014-09-30 DIAGNOSIS — R935 Abnormal findings on diagnostic imaging of other abdominal regions, including retroperitoneum: Secondary | ICD-10-CM

## 2014-09-30 DIAGNOSIS — R197 Diarrhea, unspecified: Secondary | ICD-10-CM

## 2014-09-30 NOTE — Telephone Encounter (Signed)
Patient has started her medications. She has eaten a banana, some apple sauce and had tea this morning. She has had 2 diarrhea stools. She has not collected the stool specimen. Encouraged to do this ASAP. She will keep [place] informed of her progress. Remain afebrile, no bloody stools and no vomiting.

## 2014-09-30 NOTE — Telephone Encounter (Signed)
Yes need her to return the GI pathogen panel stool sample today if possible ... Will wait for that . Can go ahead and look for another spot for Colonoscopy with Dr Fuller Plan.. In [place]- he says it is (OK to use a 10 oclock EGD spot for a colon, just need to let endo know etc

## 2014-10-01 LAB — GASTROINTESTINAL PATHOGEN PANEL PCR
C. difficile Tox A/B, PCR: POSITIVE — CR
Campylobacter, PCR: NEGATIVE
Cryptosporidium, PCR: NEGATIVE
E COLI (STEC) STX1/STX2, PCR: NEGATIVE
E coli (ETEC) LT/ST PCR: NEGATIVE
E coli 0157, PCR: NEGATIVE
Giardia lamblia, PCR: NEGATIVE
NOROVIRUS, PCR: NEGATIVE
Rotavirus A, PCR: NEGATIVE
SALMONELLA, PCR: NEGATIVE
Shigella, PCR: NEGATIVE

## 2014-10-02 ENCOUNTER — Encounter: Payer: Self-pay | Admitting: Gastroenterology

## 2014-10-02 ENCOUNTER — Encounter: Payer: Self-pay | Admitting: Internal Medicine

## 2014-10-03 ENCOUNTER — Encounter: Payer: Self-pay | Admitting: Internal Medicine

## 2014-10-04 ENCOUNTER — Ambulatory Visit (INDEPENDENT_AMBULATORY_CARE_PROVIDER_SITE_OTHER): Payer: Medicare Other | Admitting: Internal Medicine

## 2014-10-04 ENCOUNTER — Telehealth: Payer: Self-pay | Admitting: Internal Medicine

## 2014-10-04 ENCOUNTER — Other Ambulatory Visit: Payer: Self-pay

## 2014-10-04 ENCOUNTER — Ambulatory Visit: Payer: Medicare Other | Admitting: Internal Medicine

## 2014-10-04 ENCOUNTER — Encounter: Payer: Self-pay | Admitting: Internal Medicine

## 2014-10-04 VITALS — BP 120/70 | HR 87 | Temp 99.4°F | Wt 106.0 lb

## 2014-10-04 DIAGNOSIS — R197 Diarrhea, unspecified: Secondary | ICD-10-CM

## 2014-10-04 DIAGNOSIS — A0472 Enterocolitis due to Clostridium difficile, not specified as recurrent: Secondary | ICD-10-CM

## 2014-10-04 DIAGNOSIS — F411 Generalized anxiety disorder: Secondary | ICD-10-CM

## 2014-10-04 DIAGNOSIS — A047 Enterocolitis due to Clostridium difficile: Secondary | ICD-10-CM

## 2014-10-04 MED ORDER — METRONIDAZOLE 500 MG PO TABS: 500.0000 mg | ORAL_TABLET | Freq: Three times a day (TID) | ORAL | Status: DC

## 2014-10-04 MED ORDER — DIPHENOXYLATE-ATROPINE 2.5-0.025 MG PO TABS: 1.0000 | ORAL_TABLET | Freq: Four times a day (QID) | ORAL | Status: DC | PRN

## 2014-10-04 MED ORDER — METRONIDAZOLE 250 MG PO TABS: ORAL_TABLET | ORAL | Status: DC

## 2014-10-04 MED ORDER — ALPRAZOLAM 0.25 MG PO TABS: 0.2500 mg | ORAL_TABLET | Freq: Two times a day (BID) | ORAL | Status: DC | PRN

## 2014-10-04 NOTE — Assessment & Plan Note (Signed)
Start Flagyl Use Lomotil prn Diet discussed

## 2014-10-04 NOTE — Telephone Encounter (Signed)
Patient is having a really bad time with diarrhea and pain. She went to GI last week and she was not seen by the doctor and was just given medicine. She feels that it was not acceptable that she is in so much pain and was not seen by the doctor. Can you please call patient. I have tried to schedule her an appointment with you tomorrow but she wants to see someone today and so I tried to get her into [place] office but she wanted to see what you say. She has sent a few emails and is waiting to hear back.

## 2014-10-04 NOTE — Assessment & Plan Note (Signed)
Chronic 6/16 worse due to diarrhea  Potential benefits of a long term benzodiazepines  use as well as potential risks  and complications were explained to the patient and were aknowledged.

## 2014-10-04 NOTE — Progress Notes (Signed)
Subjective:   Per A. E.'s recent note:  "Julie Montgomery is a pleasant 79 year old white female new to GI today referred by Dr. Alain Marion for evaluation of lower abdominal pain diarrhea and abnormal CT scan. Patient believes she had a colonoscopy 10-12 years ago in [place] that was normal but does not remember where this was done. She did a Cologuard last year which was negative. Patient states that over the past year he has had progressively worsening problems with her bowel movements. She says she's really not had a normal formed bowel movement in a year. She is now having 12 or more bowel movements per day. He says her stools are always mushy or loose. She is not aware of any melena or hematochezia. She has developed lower abdominal discomfort and some associated low back pain. He has episodes of urgency and has had some incontinence. She's also had nocturnal episodes of diarrhea. No fevers chills or sweats. Her appetite is fair her weight is down a few pounds over the past few months. She had taken a course of an antibiotic this past winter for respiratory infection, no other new meds supplements etc. No travel or known infectious exposures She had seen her gynecologist Dr. Cletis Media a few weeks back because of the low abdominal/pelvic pain and CT of the abdomen and pelvis was ordered. This shows patient to be status post cholecystectomy ,she has a 1.5 cm splenic artery aneurysm which is been stable since 2012 and was noted to have mild colonic wall thickening in the sigmoid colon in an area where there is some diverticulosis and a question was raised whether she could have very mild diverticulitis. She was given a course of Cipro and Flagyl which she has completed and says that it did nothing for her symptoms and possibly made her diarrhea worse." C. Diff came back pos for C diff   F/u OA, GERD, anxiety - her sister and brother died this year; wt loss   Review of Systems  Constitutional: Negative  for chills, activity change, appetite change, fatigue and unexpected weight change.  HENT: Negative for congestion, mouth sores and sinus pressure.   Eyes: Negative for visual disturbance.  Respiratory: Negative for cough (mild) and chest tightness.   Gastrointestinal: Negative for nausea and abdominal pain.  Genitourinary: Negative for frequency, difficulty urinating and vaginal pain.  Musculoskeletal: Positive for arthralgias. Negative for back pain and gait problem.  Skin: Negative for pallor and rash.  Neurological: Negative for dizziness, tremors, weakness, numbness and headaches.  Psychiatric/Behavioral: Negative for suicidal ideas, confusion and sleep disturbance. The patient is sad, anxious.    Wt Readings from Last 3 Encounters:     BP Readings from Last 3 Encounters:    130/80    110/70   Wt Readings from Last 3 Encounters:  10/04/14 106 lb (48.081 kg)  09/29/14 106 lb 9.6 oz (48.353 kg)  09/29/14 106 lb 9.6 oz (48.353 kg)   BP Readings from Last 3 Encounters:  10/04/14 120/70  09/29/14 116/63  09/29/14 108/70         Objective:   Physical Exam  Constitutional: She appears well-developed. No distress.       Thin   HENT:  Head: Normocephalic.  Right Ear: External ear normal.  Left Ear: External ear normal.  Nose: Nose normal.  Mouth/Throat: Oropharynx is clear and moist.  Eyes: Conjunctivae are normal. Pupils are equal, round, and reactive to light. Right eye exhibits no discharge. Left eye exhibits no discharge.  Neck: Normal range of motion. Neck supple. No JVD present. No tracheal deviation present. No thyromegaly present.  Cardiovascular: Normal rate, regular rhythm and normal heart sounds.   Pulmonary/Chest: No stridor. No respiratory distress. She has no wheezes.  Abdominal: Soft. Bowel sounds are normal. She exhibits no distension and no mass. There is no tenderness. There is no rebound and no guarding.  Musculoskeletal: She exhibits no edema.   Lymphadenopathy:    She has no cervical adenopathy.  Neurological: She displays normal reflexes. No cranial nerve deficit. She exhibits normal muscle tone. Coordination normal.  Skin: No rash noted. No erythema.  Psychiatric: She has a sad mood and affect. Her behavior is normal. Judgment and thought content normal. Tearful R dorsal toe cyst - soft abd is tender in lower 1/2. Pt is tearful.  Lab Results  Component Value Date   WBC 14.6* 09/29/2014   HGB 13.4 09/29/2014   HCT 39.6 09/29/2014   PLT 233.0 09/29/2014   GLUCOSE 125* 09/29/2014   CHOL 221* 05/12/2013   TRIG 68.0 05/12/2013   HDL 58.90 05/12/2013   LDLDIRECT 145.7 05/12/2013   LDLCALC 125* 05/07/2012   ALT 15 03/05/2014   AST 17 03/05/2014   NA 137 09/29/2014   K 3.8 09/29/2014   CL 105 09/29/2014   CREATININE 0.68 09/29/2014   BUN 22 09/29/2014   CO2 27 09/29/2014   TSH 2.76 03/05/2014     C. Diff (+)        Assessment & Plan:

## 2014-10-04 NOTE — Progress Notes (Signed)
Pre visit review using our clinic review tool, if applicable. No additional management support is needed unless otherwise documented below in the visit note. 

## 2014-10-04 NOTE — Telephone Encounter (Signed)
Pt informed/transferred to schedule as I do not have double booking access.

## 2014-10-04 NOTE — Telephone Encounter (Signed)
Ok to w/in at 4:15 Thx

## 2014-10-04 NOTE — Telephone Encounter (Signed)
[name] son Timmothy Sours called to follow up. He requested dr plotnikov call patient. Advise dr Alain Marion has the information and is in clinic. Offered an appt with dr hopper today and he stated that he would have to call back

## 2014-10-04 NOTE — Assessment & Plan Note (Signed)
6/16 C. Diff Lomotil prn Florasor bid

## 2014-10-05 NOTE — Telephone Encounter (Signed)
C-diff positive. Patient is under treatment for this. See labs.

## 2014-10-08 ENCOUNTER — Telehealth: Payer: Self-pay | Admitting: Internal Medicine

## 2014-10-08 NOTE — Telephone Encounter (Signed)
Patient is experiencing severe pain in both sides and back of neck. She was just in on Monday and she is wondering if maybe any of her medications could be causing this. There are no openings today. I tried to get her to go to urgent care but she wanted to see what you suggest first.

## 2014-10-08 NOTE — Telephone Encounter (Signed)
I doubt it is from meds. Try Tylenol, heat - Thermapad or a rice bag. W/in at 4 pm if needed Thx

## 2014-10-08 NOTE — Telephone Encounter (Signed)
Pt informed

## 2014-10-10 ENCOUNTER — Encounter: Payer: Self-pay | Admitting: Internal Medicine

## 2014-10-14 ENCOUNTER — Encounter: Payer: Self-pay | Admitting: Family

## 2014-10-14 ENCOUNTER — Ambulatory Visit (INDEPENDENT_AMBULATORY_CARE_PROVIDER_SITE_OTHER): Payer: Medicare Other | Admitting: Family

## 2014-10-14 ENCOUNTER — Ambulatory Visit (INDEPENDENT_AMBULATORY_CARE_PROVIDER_SITE_OTHER)
Admission: RE | Admit: 2014-10-14 | Discharge: 2014-10-14 | Disposition: A | Discharge status: Home / Self Care | Payer: Medicare Other | Source: Ambulatory Visit | Attending: Family | Admitting: Family

## 2014-10-14 VITALS — BP 100/62 | HR 85 | Temp 98.6°F | Resp 18 | Ht 62.5 in | Wt 106.8 lb

## 2014-10-14 DIAGNOSIS — S99921A Unspecified injury of right foot, initial encounter: Secondary | ICD-10-CM | POA: Diagnosis not present

## 2014-10-14 DIAGNOSIS — M25474 Effusion, right foot: Secondary | ICD-10-CM

## 2014-10-14 DIAGNOSIS — M25476 Effusion, unspecified foot: Secondary | ICD-10-CM | POA: Insufficient documentation

## 2014-10-14 DIAGNOSIS — M7989 Other specified soft tissue disorders: Secondary | ICD-10-CM | POA: Diagnosis not present

## 2014-10-14 NOTE — Progress Notes (Signed)
Subjective:    Patient ID: Julie Montgomery, female    DOB: March 15, 1936, 79 y.o.   MRN: [id number]  Chief Complaint  Patient presents with  . Foot Swelling    had a fall a couple of weeks ago and has swelling in her left foot, there is soreness involved, does have discoloration and tightlness in that foot    HPI:  Julie Montgomery is a 79 y.o. female with a PMH of hypercholesterolemia, anxiety, and lumbar back pain, and C. difficile who presents today for an acute office visit.  This is a new problem. Associated symptoms of swelling located in her right foot have been going on for approximately 2 weeks following a fall that when she fell she noticed that her right second toe got bent under her foot and continues to experience some edema in her foot. Modifying factors include Tylenol which did not help very much. Current pain is described as tight and soreness. She is able to walk.  Allergies  Allergen Reactions  . Fluoxetine Hcl     REACTION: jumpy  . Lovastatin   . Mometasone Furo-Formoterol Fum     Loss of appetite, laryngitis   . Sulfa Antibiotics     Current Outpatient Prescriptions on File Prior to Visit  Medication Sig Dispense Refill  . acetaminophen (TYLENOL) 500 MG tablet Take 500 mg by mouth every 6 (six) hours as needed for mild pain.     Marland Kitchen ALPRAZolam (XANAX) 0.25 MG tablet Take 1 tablet (0.25 mg total) by mouth 2 (two) times daily as needed for anxiety or sleep. 60 tablet 1  . Cholecalciferol (VITAMIN D3) 1000 UNITS CAPS Take 1 capsule by mouth daily. Take total of 2 daily    . diphenoxylate-atropine (LOMOTIL) 2.5-0.025 MG per tablet Take 1-2 tablets by mouth 4 (four) times daily as needed for diarrhea or loose stools. 60 tablet 1  . guaiFENesin (MUCINEX) 600 MG 12 hr tablet Take by mouth 2 (two) times daily.    . metroNIDAZOLE (FLAGYL) 500 MG tablet Take 1 tablet (500 mg total) by mouth 3 (three) times daily. 42 tablet 0  . Na Sulfate-K Sulfate-Mg Sulf SOLN Take 1 kit  by mouth once. 354 mL 0  . saccharomyces boulardii (FLORASTOR) 250 MG capsule Take 1 capsule (250 mg total) by mouth 2 (two) times daily. 60 capsule 1   No current facility-administered medications on file prior to visit.   Past Medical History  Diagnosis Date  . Osteopenia   . Anxiety   . Pneumonia     hx of 03/2008    Review of Systems  Constitutional: Negative for fever and chills.  Respiratory: Negative for chest tightness and shortness of breath.   Cardiovascular: Negative for chest pain.  Musculoskeletal:       Positive for right foot edema and right 2nd toe discoloration.   Neurological: Negative for weakness and numbness.      Objective:    BP 100/62 mmHg  Pulse 85  Temp(Src) 98.6 F (37 C) (Oral)  Resp 18  Ht 5' 2.5" (1.588 m)  Wt 106 lb 12.8 oz (48.444 kg)  BMI 19.21 kg/m2  SpO2 95% Nursing note and vital signs reviewed.  Physical Exam  Constitutional: She is oriented to person, place, and time. She appears well-developed and well-nourished. No distress.  Cardiovascular: Normal rate, regular rhythm, normal heart sounds and intact distal pulses.   Pulmonary/Chest: Effort normal and breath sounds normal.  Musculoskeletal:  Right foot - moderate amount  of edema noted of right foot and ankle. Mild discoloration on dorsum of right second toe. Questionable boutonniere looking deformity of right second toe. No tenderness able to be elicited. Ankle and toe ROM intact and appropriate. Mild decrease in strength of right second toe noted. Ankle ligaments intact and appropriate.  Neurological: She is alert and oriented to person, place, and time.  Skin: Skin is warm and dry.  Psychiatric: She has a normal mood and affect. Her behavior is normal. Judgment and thought content normal.       Assessment & Plan:   Problem List Items Addressed This Visit      Musculoskeletal and Integument   Swelling of foot joint - Primary    Symptoms and exam consistent with toe/foot  sprain/strain. Cannot rule out damage to the extensor digitorum. Treat conservatively with heat, elevation, and compression. Pulses are intact and appropriate in no obvious redness or pain decreasing the odds of a DVT. Obtain x-ray to rule out fracture. Follow-up pending x-ray results.      Relevant Orders   DG Foot Complete Right

## 2014-10-14 NOTE — Assessment & Plan Note (Signed)
Symptoms and exam consistent with toe/foot sprain/strain. Cannot rule out damage to the extensor digitorum. Treat conservatively with heat, elevation, and compression. Pulses are intact and appropriate in no obvious redness or pain decreasing the odds of a DVT. Obtain x-ray to rule out fracture. Follow-up pending x-ray results.

## 2014-10-14 NOTE — Patient Instructions (Addendum)
Thank you for choosing [facility].  Summary/Instructions:   Please stop by radiology on the basement level of the building for your x-rays. Your results will be released to [place] (or called to you) after review, usually within 72 hours after test completion. If any treatments or changes are necessary, you will be notified at that same time.  If your symptoms worsen or fail to improve, please contact our office for further instruction, or in case of emergency go directly to the emergency room at the closest medical facility.  Heat/ice 2-3 times per day. Elevate your leg when seated. Use an Ace Wrap to apply compression to the area.   Foot Sprain/Strain The muscles and cord like structures which attach muscle to bone (tendons) that surround the feet are made up of units. A foot sprain can occur at the weakest spot in any of these units. This condition is most often caused by injury to or overuse of the foot, as from playing contact sports, or aggravating a previous injury, or from poor conditioning, or obesity. SYMPTOMS  Pain with movement of the foot.  Tenderness and swelling at the injury site.  Loss of strength is present in moderate or severe sprains. THE THREE GRADES OR SEVERITY OF FOOT SPRAIN ARE:  Mild (Grade I): Slightly pulled muscle without tearing of muscle or tendon fibers or loss of strength.  Moderate (Grade II): Tearing of fibers in a muscle, tendon, or at the attachment to bone, with small decrease in strength.  Severe (Grade III): Rupture of the muscle-tendon-bone attachment, with separation of fibers. Severe sprain requires surgical repair. Often repeating (chronic) sprains are caused by overuse. Sudden (acute) sprains are caused by direct injury or over-use. DIAGNOSIS  Diagnosis of this condition is usually by your own observation. If problems continue, a caregiver may be required for further evaluation and treatment. X-rays may be required to make sure there  are not breaks in the bones (fractures) present. Continued problems may require physical therapy for treatment. PREVENTION  Use strength and conditioning exercises appropriate for your sport.  Warm up properly prior to working out.  Use athletic shoes that are made for the sport you are participating in.  Allow adequate time for healing. Early return to activities makes repeat injury more likely, and can lead to an unstable arthritic foot that can result in prolonged disability. Mild sprains generally heal in 3 to 10 days, with moderate and severe sprains taking 2 to 10 weeks. Your caregiver can help you determine the proper time required for healing. HOME CARE INSTRUCTIONS   Apply ice to the injury for 15-20 minutes, 03-04 times per day. Put the ice in a plastic bag and place a towel between the bag of ice and your skin.  An elastic wrap (like an Ace bandage) may be used to keep swelling down.  Keep foot above the level of the heart, or at least raised on a footstool, when swelling and pain are present.  Try to avoid use other than gentle range of motion while the foot is painful. Do not resume use until instructed by your caregiver. Then begin use gradually, not increasing use to the point of pain. If pain does develop, decrease use and continue the above measures, gradually increasing activities that do not cause discomfort, until you gradually achieve normal use.  Use crutches if and as instructed, and for the length of time instructed.  Keep injured foot and ankle wrapped between treatments.  Massage foot and ankle for  comfort and to keep swelling down. Massage from the toes up towards the knee.  Only take over-the-counter or prescription medicines for pain, discomfort, or fever as directed by your caregiver. SEEK IMMEDIATE MEDICAL CARE IF:   Your pain and swelling increase, or pain is not controlled with medications.  You have loss of feeling in your foot or your foot turns cold  or blue.  You develop new, unexplained symptoms, or an increase of the symptoms that brought you to your caregiver. MAKE SURE YOU:   Understand these instructions.  Will watch your condition.  Will get help right away if you are not doing well or get worse. Document Released: 10/06/2001 Document Revised: 07/09/2011 Document Reviewed: 12/04/2007 [facility] Patient Information 2015 [place], [place]. This information is not intended to replace advice given to you by your health care provider. Make sure you discuss any questions you have with your health care provider.

## 2014-10-15 ENCOUNTER — Encounter: Payer: Self-pay | Admitting: Family

## 2014-10-22 ENCOUNTER — Telehealth: Payer: Self-pay | Admitting: Physician Assistant

## 2014-10-22 NOTE — Telephone Encounter (Signed)
Patient instructed. She expresses understanding.

## 2014-10-22 NOTE — Telephone Encounter (Signed)
Doctor of the Day This is a patient of Dr Lynne Leader seen by Nicoletta Ba and diagnosed with C-diff. She was started on a 14 day course of Flagyl 500 mg tid. She completed the ATB's Monday. She is not having diarrhea, but soft and unformed stool, 1 to 2 a day. She states she feels better than before. Patient is very anxious and wants to be certain she is okay not having formed stool yet. She denies any diarrhea, fever or pain.

## 2014-10-22 NOTE — Telephone Encounter (Signed)
Okay to observe for now. However, if diarrhea returns and persists for more than 2 days, contact the office. Thanks

## 2014-10-28 DIAGNOSIS — Z961 Presence of intraocular lens: Secondary | ICD-10-CM | POA: Diagnosis not present

## 2014-10-28 DIAGNOSIS — Z0100 Encounter for examination of eyes and vision without abnormal findings: Secondary | ICD-10-CM | POA: Diagnosis not present

## 2014-10-28 DIAGNOSIS — H16103 Unspecified superficial keratitis, bilateral: Secondary | ICD-10-CM | POA: Diagnosis not present

## 2014-10-28 DIAGNOSIS — H04123 Dry eye syndrome of bilateral lacrimal glands: Secondary | ICD-10-CM | POA: Diagnosis not present

## 2014-11-02 ENCOUNTER — Telehealth: Payer: Self-pay | Admitting: Gastroenterology

## 2014-11-02 NOTE — Telephone Encounter (Signed)
Agree , discussed with Beth.

## 2014-11-02 NOTE — Telephone Encounter (Signed)
Patient is now having 6+ stools a day. She feels wiped out again where she had been feeling better. She completed her first treatment for c-diff on 10/25/14. She had taken Flagyl 500 mg TID for 14 days. Per Nicoletta Ba, PA the patient will start Vancomycin 250 mg QID for 14 days. Florastor BID for 30 days. Keep up coming appointment. Good PO fluid and nutrient intake is to be encouraged. Call us back if she is acutely worsening. Patient expresses understanding of the plan.

## 2014-11-05 ENCOUNTER — Encounter: Payer: Self-pay | Admitting: Internal Medicine

## 2014-11-05 ENCOUNTER — Ambulatory Visit (INDEPENDENT_AMBULATORY_CARE_PROVIDER_SITE_OTHER): Payer: Medicare Other | Admitting: Internal Medicine

## 2014-11-05 VITALS — BP 120/68 | HR 67 | Temp 98.4°F | Resp 11 | Ht 63.0 in | Wt 102.0 lb

## 2014-11-05 DIAGNOSIS — R197 Diarrhea, unspecified: Secondary | ICD-10-CM | POA: Diagnosis not present

## 2014-11-05 DIAGNOSIS — A047 Enterocolitis due to Clostridium difficile: Secondary | ICD-10-CM | POA: Diagnosis not present

## 2014-11-05 DIAGNOSIS — A0472 Enterocolitis due to Clostridium difficile, not specified as recurrent: Secondary | ICD-10-CM

## 2014-11-05 DIAGNOSIS — S40812A Abrasion of left upper arm, initial encounter: Secondary | ICD-10-CM | POA: Diagnosis not present

## 2014-11-05 NOTE — Progress Notes (Signed)
Pre visit review using our clinic review tool, if applicable. No additional management support is needed unless otherwise documented below in the visit note. 

## 2014-11-05 NOTE — Progress Notes (Signed)
Subjective:  Patient ID: Julie Montgomery, female    DOB: [birth year]  Age: 79 y.o. MRN: [id number]  CC: No chief complaint on file.   HPI ARMANI GAWLIK presents for C diff diarrhea, anxiety, stress  Outpatient Prescriptions Prior to Visit  Medication Sig Dispense Refill  . acetaminophen (TYLENOL) 500 MG tablet Take 500 mg by mouth every 6 (six) hours as needed for mild pain.     Marland Kitchen ALPRAZolam (XANAX) 0.25 MG tablet Take 1 tablet (0.25 mg total) by mouth 2 (two) times daily as needed for anxiety or sleep. 60 tablet 1  . Cholecalciferol (VITAMIN D3) 1000 UNITS CAPS Take 1 capsule by mouth daily. Take total of 2 daily    . guaiFENesin (MUCINEX) 600 MG 12 hr tablet Take by mouth 2 (two) times daily.    Marland Kitchen saccharomyces boulardii (FLORASTOR) 250 MG capsule Take 1 capsule (250 mg total) by mouth 2 (two) times daily. 60 capsule 1  . diphenoxylate-atropine (LOMOTIL) 2.5-0.025 MG per tablet Take 1-2 tablets by mouth 4 (four) times daily as needed for diarrhea or loose stools. (Patient not taking: Reported on 11/05/2014) 60 tablet 1  . metroNIDAZOLE (FLAGYL) 500 MG tablet Take 1 tablet (500 mg total) by mouth 3 (three) times daily. (Patient not taking: Reported on 11/05/2014) 42 tablet 0   No facility-administered medications prior to visit.    ROS Review of Systems  Constitutional: Negative for chills, activity change, appetite change, fatigue and unexpected weight change.  HENT: Negative for congestion, mouth sores and sinus pressure.   Eyes: Negative for visual disturbance.  Respiratory: Negative for cough and chest tightness.   Gastrointestinal: Positive for diarrhea. Negative for nausea and abdominal pain.  Genitourinary: Negative for frequency, difficulty urinating and vaginal pain.  Musculoskeletal: Negative for back pain and gait problem.  Skin: Negative for pallor and rash.  Neurological: Negative for dizziness, tremors, weakness, numbness and headaches.  Psychiatric/Behavioral:  Negative for suicidal ideas, confusion and sleep disturbance. The patient is nervous/anxious.     Objective:  BP 120/68 mmHg  Pulse 67  Temp(Src) 98.4 F (36.9 C) (Oral)  Resp 11  Ht 5\' 3"  (1.6 m)  Wt 102 lb (46.267 kg)  BMI 18.07 kg/m2  SpO2 97%  BP Readings from Last 3 Encounters:  11/05/14 120/68  10/14/14 100/62  10/04/14 120/70    Wt Readings from Last 3 Encounters:  11/05/14 102 lb (46.267 kg)  10/14/14 106 lb 12.8 oz (48.444 kg)  10/04/14 106 lb (48.081 kg)    Physical Exam  Constitutional: She appears well-developed. No distress.  Thin  HENT:  Head: Normocephalic.  Right Ear: External ear normal.  Left Ear: External ear normal.  Nose: Nose normal.  Mouth/Throat: Oropharynx is clear and moist.  Eyes: Conjunctivae are normal. Pupils are equal, round, and reactive to light. Right eye exhibits no discharge. Left eye exhibits no discharge.  Neck: Normal range of motion. Neck supple. No JVD present. No tracheal deviation present. No thyromegaly present.  Cardiovascular: Normal rate, regular rhythm and normal heart sounds.   Pulmonary/Chest: No stridor. No respiratory distress. She has no wheezes.  Abdominal: Soft. Bowel sounds are normal. She exhibits no distension and no mass. There is no tenderness. There is no rebound and no guarding.  Musculoskeletal: She exhibits no edema or tenderness.  Lymphadenopathy:    She has no cervical adenopathy.  Neurological: She displays normal reflexes. No cranial nerve deficit. She exhibits normal muscle tone. Coordination normal.  Skin: No rash noted. No  erythema.  Psychiatric: Her behavior is normal. Judgment and thought content normal.    Lab Results  Component Value Date   WBC 14.6* 09/29/2014   HGB 13.4 09/29/2014   HCT 39.6 09/29/2014   PLT 233.0 09/29/2014   GLUCOSE 125* 09/29/2014   CHOL 221* 05/12/2013   TRIG 68.0 05/12/2013   HDL 58.90 05/12/2013   LDLDIRECT 145.7 05/12/2013   LDLCALC 125* 05/07/2012   ALT 15  03/05/2014   AST 17 03/05/2014   NA 137 09/29/2014   K 3.8 09/29/2014   CL 105 09/29/2014   CREATININE 0.68 09/29/2014   BUN 22 09/29/2014   CO2 27 09/29/2014   TSH 2.76 03/05/2014    Dg Foot Complete Right  10/15/2014   CLINICAL DATA:  Swelling for 1 month.  Status post fall.  EXAM: RIGHT FOOT COMPLETE - 3+ VIEW  COMPARISON:  None.  FINDINGS: There is no evidence of fracture or dislocation. There is no evidence of arthropathy or other focal bone abnormality. Soft tissues are unremarkable. Incidental Achilles and plantar spurs.  IMPRESSION: Negative.   Electronically Signed   By: Staci Righter M.D.   On: 10/15/2014 08:31    Assessment & Plan:   There are no diagnoses linked to this encounter. I am having Ms. Escamilla maintain her Vitamin D3, acetaminophen, guaiFENesin, saccharomyces boulardii, diphenoxylate-atropine, metroNIDAZOLE, ALPRAZolam, vancomycin, and dicyclomine.  Meds ordered this encounter  Medications  . vancomycin (VANCOCIN) 250 MG capsule    Sig: Take 250 mg by mouth 4 (four) times daily.  Marland Kitchen dicyclomine (BENTYL) 10 MG capsule    Sig: TAKE 1 CAPSULE BY MOUTH 4 TIMES A DAY BEFORE MEALS AND AT BEDTIME    Refill:  0     Follow-up: No Follow-up on file.  Walker Kehr, MD

## 2014-11-05 NOTE — Assessment & Plan Note (Signed)
On Vanc

## 2014-11-05 NOTE — Assessment & Plan Note (Signed)
Dressed

## 2014-11-05 NOTE — Assessment & Plan Note (Signed)
On po Vanc now

## 2014-11-18 ENCOUNTER — Telehealth: Payer: Self-pay | Admitting: Gastroenterology

## 2014-11-18 NOTE — Telephone Encounter (Signed)
Off ATB's 2 days. Doing well at this point. When do you need to see her?

## 2014-11-19 ENCOUNTER — Encounter: Payer: Medicare Other | Admitting: Gastroenterology

## 2014-11-22 NOTE — Telephone Encounter (Signed)
Do not need to schedule appt if she is better, ask her to stay on florastor for another  Month.

## 2014-11-22 NOTE — Telephone Encounter (Signed)
No answer. Left message with information.

## 2014-12-07 ENCOUNTER — Ambulatory Visit (INDEPENDENT_AMBULATORY_CARE_PROVIDER_SITE_OTHER): Payer: Medicare Other | Admitting: Internal Medicine

## 2014-12-07 ENCOUNTER — Encounter: Payer: Self-pay | Admitting: Internal Medicine

## 2014-12-07 VITALS — BP 120/80 | HR 80 | Wt 101.0 lb

## 2014-12-07 DIAGNOSIS — A0472 Enterocolitis due to Clostridium difficile, not specified as recurrent: Secondary | ICD-10-CM

## 2014-12-07 DIAGNOSIS — R634 Abnormal weight loss: Secondary | ICD-10-CM | POA: Diagnosis not present

## 2014-12-07 DIAGNOSIS — R197 Diarrhea, unspecified: Secondary | ICD-10-CM

## 2014-12-07 DIAGNOSIS — A047 Enterocolitis due to Clostridium difficile: Secondary | ICD-10-CM | POA: Diagnosis not present

## 2014-12-07 DIAGNOSIS — F4323 Adjustment disorder with mixed anxiety and depressed mood: Secondary | ICD-10-CM

## 2014-12-07 NOTE — Assessment & Plan Note (Signed)
Discussed.

## 2014-12-07 NOTE — Assessment & Plan Note (Addendum)
Chronic issue Worse - post-colitis diarrhea Worse due to grief

## 2014-12-07 NOTE — Progress Notes (Signed)
Pre visit review using our clinic review tool, if applicable. No additional management support is needed unless otherwise documented below in the visit note. 

## 2014-12-07 NOTE — Assessment & Plan Note (Signed)
Resolved

## 2014-12-07 NOTE — Assessment & Plan Note (Signed)
Florastor Labs

## 2014-12-07 NOTE — Progress Notes (Signed)
Subjective:  Patient ID: Julie Montgomery, female    DOB: 12-Dec-1935  Age: 79 y.o. MRN: [id number]  CC: No chief complaint on file.   HPI Julie Montgomery presents for diarrhea - better, wt loss - better. Pt is tearful - lost her dog this past we  Outpatient Prescriptions Prior to Visit  Medication Sig Dispense Refill  . acetaminophen (TYLENOL) 500 MG tablet Take 500 mg by mouth every 6 (six) hours as needed for mild pain.     Marland Kitchen ALPRAZolam (XANAX) 0.25 MG tablet Take 1 tablet (0.25 mg total) by mouth 2 (two) times daily as needed for anxiety or sleep. 60 tablet 1  . Cholecalciferol (VITAMIN D3) 1000 UNITS CAPS Take 1 capsule by mouth daily. Take total of 2 daily    . guaiFENesin (MUCINEX) 600 MG 12 hr tablet Take by mouth 2 (two) times daily.    Marland Kitchen saccharomyces boulardii (FLORASTOR) 250 MG capsule Take 1 capsule (250 mg total) by mouth 2 (two) times daily. 60 capsule 1  . dicyclomine (BENTYL) 10 MG capsule TAKE 1 CAPSULE BY MOUTH 4 TIMES A DAY BEFORE MEALS AND AT BEDTIME  0  . diphenoxylate-atropine (LOMOTIL) 2.5-0.025 MG per tablet Take 1-2 tablets by mouth 4 (four) times daily as needed for diarrhea or loose stools. (Patient not taking: Reported on 12/07/2014) 60 tablet 1  . metroNIDAZOLE (FLAGYL) 500 MG tablet Take 1 tablet (500 mg total) by mouth 3 (three) times daily. (Patient not taking: Reported on 12/07/2014) 42 tablet 0  . vancomycin (VANCOCIN) 250 MG capsule Take 250 mg by mouth 4 (four) times daily.     No facility-administered medications prior to visit.    ROS Review of Systems  Constitutional: Positive for unexpected weight change. Negative for chills, activity change, appetite change and fatigue.  HENT: Negative for congestion, mouth sores and sinus pressure.   Eyes: Negative for visual disturbance.  Respiratory: Negative for cough and chest tightness.   Gastrointestinal: Negative for nausea and abdominal pain.  Genitourinary: Negative for frequency, difficulty urinating  and vaginal pain.  Musculoskeletal: Negative for back pain and gait problem.  Skin: Negative for pallor and rash.  Neurological: Negative for dizziness, tremors, weakness, numbness and headaches.  Psychiatric/Behavioral: Negative for suicidal ideas, confusion and sleep disturbance. The patient is nervous/anxious.     Objective:  BP 120/80 mmHg  Pulse 80  Wt 101 lb (45.813 kg)  SpO2 96%  BP Readings from Last 3 Encounters:  12/07/14 120/80  11/05/14 120/68  10/14/14 100/62    Wt Readings from Last 3 Encounters:  12/07/14 101 lb (45.813 kg)  11/05/14 102 lb (46.267 kg)  10/14/14 106 lb 12.8 oz (48.444 kg)    Physical Exam  Constitutional: She appears well-developed. No distress.  Thin  HENT:  Head: Normocephalic.  Right Ear: External ear normal.  Left Ear: External ear normal.  Nose: Nose normal.  Mouth/Throat: Oropharynx is clear and moist.  Eyes: Conjunctivae are normal. Pupils are equal, round, and reactive to light. Right eye exhibits no discharge. Left eye exhibits no discharge.  Neck: Normal range of motion. Neck supple. No JVD present. No tracheal deviation present. No thyromegaly present.  Cardiovascular: Normal rate, regular rhythm and normal heart sounds.   Pulmonary/Chest: No stridor. No respiratory distress. She has no wheezes.  Abdominal: Soft. Bowel sounds are normal. She exhibits no distension and no mass. There is no tenderness. There is no rebound and no guarding.  Musculoskeletal: She exhibits no edema or tenderness.  Lymphadenopathy:    She has no cervical adenopathy.  Neurological: She displays normal reflexes. No cranial nerve deficit. She exhibits normal muscle tone. Coordination normal.  Skin: No rash noted. No erythema.  Psychiatric: She has a normal mood and affect. Her behavior is normal. Judgment and thought content normal.  tearful  Lab Results  Component Value Date   WBC 14.6* 09/29/2014   HGB 13.4 09/29/2014   HCT 39.6 09/29/2014   PLT  233.0 09/29/2014   GLUCOSE 125* 09/29/2014   CHOL 221* 05/12/2013   TRIG 68.0 05/12/2013   HDL 58.90 05/12/2013   LDLDIRECT 145.7 05/12/2013   LDLCALC 125* 05/07/2012   ALT 15 03/05/2014   AST 17 03/05/2014   NA 137 09/29/2014   K 3.8 09/29/2014   CL 105 09/29/2014   CREATININE 0.68 09/29/2014   BUN 22 09/29/2014   CO2 27 09/29/2014   TSH 2.76 03/05/2014    Dg Foot Complete Right  10/15/2014   CLINICAL DATA:  Swelling for 1 month.  Status post fall.  EXAM: RIGHT FOOT COMPLETE - 3+ VIEW  COMPARISON:  None.  FINDINGS: There is no evidence of fracture or dislocation. There is no evidence of arthropathy or other focal bone abnormality. Soft tissues are unremarkable. Incidental Achilles and plantar spurs.  IMPRESSION: Negative.   Electronically Signed   By: Staci Righter M.D.   On: 10/15/2014 08:31    Assessment & Plan:   Diagnoses and all orders for this visit:  Clostridium difficile colitis  Situational mixed anxiety and depressive disorder  Diarrhea  WEIGHT LOSS, ABNORMAL  I am having Ms. Franta maintain her Vitamin D3, acetaminophen, guaiFENesin, saccharomyces boulardii, diphenoxylate-atropine, metroNIDAZOLE, ALPRAZolam, vancomycin, and dicyclomine.  No orders of the defined types were placed in this encounter.     Follow-up: No Follow-up on file.  Walker Kehr, MD

## 2014-12-16 ENCOUNTER — Ambulatory Visit (INDEPENDENT_AMBULATORY_CARE_PROVIDER_SITE_OTHER): Payer: Medicare Other | Admitting: Psychology

## 2014-12-16 DIAGNOSIS — F4322 Adjustment disorder with anxiety: Secondary | ICD-10-CM

## 2014-12-30 ENCOUNTER — Encounter: Payer: Self-pay | Admitting: Internal Medicine

## 2014-12-30 ENCOUNTER — Ambulatory Visit (INDEPENDENT_AMBULATORY_CARE_PROVIDER_SITE_OTHER): Payer: Medicare Other | Admitting: Internal Medicine

## 2014-12-30 ENCOUNTER — Ambulatory Visit: Payer: Medicare Other | Admitting: Internal Medicine

## 2014-12-30 VITALS — BP 138/74 | HR 67 | Wt 104.0 lb

## 2014-12-30 DIAGNOSIS — Z23 Encounter for immunization: Secondary | ICD-10-CM

## 2014-12-30 DIAGNOSIS — F411 Generalized anxiety disorder: Secondary | ICD-10-CM | POA: Diagnosis not present

## 2014-12-30 DIAGNOSIS — R197 Diarrhea, unspecified: Secondary | ICD-10-CM

## 2014-12-30 DIAGNOSIS — A0472 Enterocolitis due to Clostridium difficile, not specified as recurrent: Secondary | ICD-10-CM

## 2014-12-30 DIAGNOSIS — A047 Enterocolitis due to Clostridium difficile: Secondary | ICD-10-CM

## 2014-12-30 DIAGNOSIS — R634 Abnormal weight loss: Secondary | ICD-10-CM | POA: Diagnosis not present

## 2014-12-30 NOTE — Assessment & Plan Note (Signed)
Wt Readings from Last 3 Encounters:  12/30/14 104 lb (47.174 kg)  12/07/14 101 lb (45.813 kg)  11/05/14 102 lb (46.267 kg)   Doing better

## 2014-12-30 NOTE — Assessment & Plan Note (Signed)
Diarrhea resolved.   ?

## 2014-12-30 NOTE — Assessment & Plan Note (Signed)
Xanax prn 

## 2014-12-30 NOTE — Progress Notes (Signed)
Pre visit review using our clinic review tool, if applicable. No additional management support is needed unless otherwise documented below in the visit note. 

## 2014-12-30 NOTE — Progress Notes (Signed)
Subjective:  Patient ID: Julie Montgomery, female    DOB: 05/22/1935  Age: 79 y.o. MRN: [id number]  CC: No chief complaint on file.   HPI Julie Montgomery presents for wt loss, diarrhea, C diff. Pt was grieving about her collie dog passing.  Outpatient Prescriptions Prior to Visit  Medication Sig Dispense Refill  . acetaminophen (TYLENOL) 500 MG tablet Take 500 mg by mouth every 6 (six) hours as needed for mild pain.     Marland Kitchen ALPRAZolam (XANAX) 0.25 MG tablet Take 1 tablet (0.25 mg total) by mouth 2 (two) times daily as needed for anxiety or sleep. 60 tablet 1  . Cholecalciferol (VITAMIN D3) 1000 UNITS CAPS Take 1 capsule by mouth daily. Take total of 2 daily    . dicyclomine (BENTYL) 10 MG capsule TAKE 1 CAPSULE BY MOUTH 4 TIMES A DAY BEFORE MEALS AND AT BEDTIME  0  . guaiFENesin (MUCINEX) 600 MG 12 hr tablet Take by mouth 2 (two) times daily.    Marland Kitchen saccharomyces boulardii (FLORASTOR) 250 MG capsule Take 1 capsule (250 mg total) by mouth 2 (two) times daily. (Patient taking differently: Take 250 mg by mouth daily. ) 60 capsule 1  . metroNIDAZOLE (FLAGYL) 500 MG tablet Take 1 tablet (500 mg total) by mouth 3 (three) times daily. 42 tablet 0  . vancomycin (VANCOCIN) 250 MG capsule Take 250 mg by mouth 4 (four) times daily.    . diphenoxylate-atropine (LOMOTIL) 2.5-0.025 MG per tablet Take 1-2 tablets by mouth 4 (four) times daily as needed for diarrhea or loose stools. (Patient not taking: Reported on 12/30/2014) 60 tablet 1   No facility-administered medications prior to visit.    ROS Review of Systems  Constitutional: Negative for chills, activity change, appetite change, fatigue and unexpected weight change.  HENT: Negative for congestion, mouth sores and sinus pressure.   Eyes: Negative for visual disturbance.  Respiratory: Negative for cough and chest tightness.   Gastrointestinal: Negative for nausea, abdominal pain and diarrhea.  Genitourinary: Negative for frequency, difficulty  urinating and vaginal pain.  Musculoskeletal: Negative for back pain and gait problem.  Skin: Negative for pallor and rash.  Neurological: Negative for dizziness, tremors, weakness, numbness and headaches.  Psychiatric/Behavioral: Negative for confusion and sleep disturbance. The patient is nervous/anxious.     Objective:  BP 138/74 mmHg  Pulse 67  Wt 104 lb (47.174 kg)  SpO2 96%  BP Readings from Last 3 Encounters:  12/30/14 138/74  12/07/14 120/80  11/05/14 120/68    Wt Readings from Last 3 Encounters:  12/30/14 104 lb (47.174 kg)  12/07/14 101 lb (45.813 kg)  11/05/14 102 lb (46.267 kg)    Physical Exam  Constitutional: She appears well-developed. No distress.  HENT:  Head: Normocephalic.  Right Ear: External ear normal.  Left Ear: External ear normal.  Nose: Nose normal.  Mouth/Throat: Oropharynx is clear and moist.  Eyes: Conjunctivae are normal. Pupils are equal, round, and reactive to light. Right eye exhibits no discharge. Left eye exhibits no discharge.  Neck: Normal range of motion. Neck supple. No JVD present. No tracheal deviation present. No thyromegaly present.  Cardiovascular: Normal rate, regular rhythm and normal heart sounds.   Pulmonary/Chest: No stridor. No respiratory distress. She has no wheezes.  Abdominal: Soft. Bowel sounds are normal. She exhibits no distension and no mass. There is no tenderness. There is no rebound and no guarding.  Musculoskeletal: She exhibits no edema or tenderness.  Lymphadenopathy:    She has no  cervical adenopathy.  Neurological: She displays normal reflexes. No cranial nerve deficit. She exhibits normal muscle tone. Coordination normal.  Skin: No rash noted. No erythema.  Psychiatric: She has a normal mood and affect. Her behavior is normal. Judgment and thought content normal.    Lab Results  Component Value Date   WBC 14.6* 09/29/2014   HGB 13.4 09/29/2014   HCT 39.6 09/29/2014   PLT 233.0 09/29/2014   GLUCOSE  125* 09/29/2014   CHOL 221* 05/12/2013   TRIG 68.0 05/12/2013   HDL 58.90 05/12/2013   LDLDIRECT 145.7 05/12/2013   LDLCALC 125* 05/07/2012   ALT 15 03/05/2014   AST 17 03/05/2014   NA 137 09/29/2014   K 3.8 09/29/2014   CL 105 09/29/2014   CREATININE 0.68 09/29/2014   BUN 22 09/29/2014   CO2 27 09/29/2014   TSH 2.76 03/05/2014    Dg Foot Complete Right  10/15/2014   CLINICAL DATA:  Swelling for 1 month.  Status post fall.  EXAM: RIGHT FOOT COMPLETE - 3+ VIEW  COMPARISON:  None.  FINDINGS: There is no evidence of fracture or dislocation. There is no evidence of arthropathy or other focal bone abnormality. Soft tissues are unremarkable. Incidental Achilles and plantar spurs.  IMPRESSION: Negative.   Electronically Signed   By: Staci Righter M.D.   On: 10/15/2014 08:31    Assessment & Plan:   There are no diagnoses linked to this encounter. I have discontinued Julie Montgomery metroNIDAZOLE and vancomycin. I am also having her maintain her Vitamin D3, acetaminophen, guaiFENesin, saccharomyces boulardii, diphenoxylate-atropine, ALPRAZolam, and dicyclomine.  No orders of the defined types were placed in this encounter.     Follow-up: No Follow-up on file.  Walker Kehr, MD

## 2015-01-06 ENCOUNTER — Ambulatory Visit (INDEPENDENT_AMBULATORY_CARE_PROVIDER_SITE_OTHER): Payer: Medicare Other | Admitting: Psychology

## 2015-01-06 DIAGNOSIS — F4322 Adjustment disorder with anxiety: Secondary | ICD-10-CM

## 2015-01-20 ENCOUNTER — Ambulatory Visit (INDEPENDENT_AMBULATORY_CARE_PROVIDER_SITE_OTHER): Payer: Medicare Other | Admitting: Psychology

## 2015-01-20 DIAGNOSIS — F4323 Adjustment disorder with mixed anxiety and depressed mood: Secondary | ICD-10-CM

## 2015-01-26 ENCOUNTER — Encounter: Payer: Self-pay | Admitting: Internal Medicine

## 2015-01-26 ENCOUNTER — Telehealth: Payer: Self-pay | Admitting: *Deleted

## 2015-01-26 ENCOUNTER — Ambulatory Visit (INDEPENDENT_AMBULATORY_CARE_PROVIDER_SITE_OTHER): Payer: Medicare Other | Admitting: Internal Medicine

## 2015-01-26 VITALS — BP 142/72 | HR 72 | Temp 98.6°F | Wt 106.0 lb

## 2015-01-26 DIAGNOSIS — M542 Cervicalgia: Secondary | ICD-10-CM

## 2015-01-26 MED ORDER — HYDROCODONE-IBUPROFEN 7.5-200 MG PO TABS: 1.0000 | ORAL_TABLET | Freq: Four times a day (QID) | ORAL | Status: DC | PRN

## 2015-01-26 MED ORDER — HYDROCODONE-IBUPROFEN 5-200 MG PO TABS: 0.5000 | ORAL_TABLET | Freq: Four times a day (QID) | ORAL | Status: DC | PRN

## 2015-01-26 NOTE — Patient Instructions (Signed)

## 2015-01-26 NOTE — Telephone Encounter (Signed)
Ok Thx 

## 2015-01-26 NOTE — Telephone Encounter (Signed)
Receive call pt is in the store brought rx for Hydrocodone-Ibuprofen 5/200 mg. Pharmacy doesn't have that strength. Asking if mg can be increase to 7.5/200 mg...Julie Montgomery

## 2015-01-26 NOTE — Telephone Encounter (Signed)
Notified pharmacy spoke with kathy/pharmacist gave md response. Updated chart...[name]

## 2015-01-26 NOTE — Assessment & Plan Note (Signed)
9/16 MSK strain Neck collar Vicoprofen prn  Potential benefits of a short term opioids use as well as potential risks (i.e. addiction risk, apnea etc) and complications (i.e. Somnolence, constipation and others) were explained to the patient and were aknowledged.

## 2015-01-26 NOTE — Progress Notes (Signed)
Subjective:  Patient ID: Julie Montgomery, female    DOB: 1936/01/13  Age: 79 y.o. MRN: [id number]  CC: No chief complaint on file.   HPI Julie Montgomery presents for diarrhea, wt loss f/up - resolved. 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 Pain started next AM 3-4 d ago. Pt took Tylenol w/  Outpatient Prescriptions Prior to Visit  Medication Sig Dispense Refill  . acetaminophen (TYLENOL) 500 MG tablet Take 500 mg by mouth every 6 (six) hours as needed for mild pain.     . Cholecalciferol (VITAMIN D3) 1000 UNITS CAPS Take 1 capsule by mouth daily. Take total of 2 daily    . guaiFENesin (MUCINEX) 600 MG 12 hr tablet Take by mouth 2 (two) times daily.    Marland Kitchen saccharomyces boulardii (FLORASTOR) 250 MG capsule Take 1 capsule (250 mg total) by mouth 2 (two) times daily. (Patient taking differently: Take 250 mg by mouth daily. ) 60 capsule 1  . ALPRAZolam (XANAX) 0.25 MG tablet Take 1 tablet (0.25 mg total) by mouth 2 (two) times daily as needed for anxiety or sleep. (Patient not taking: Reported on 01/26/2015) 60 tablet 1  . dicyclomine (BENTYL) 10 MG capsule TAKE 1 CAPSULE BY MOUTH 4 TIMES A DAY BEFORE MEALS AND AT BEDTIME  0  . diphenoxylate-atropine (LOMOTIL) 2.5-0.025 MG per tablet Take 1-2 tablets by mouth 4 (four) times daily as needed for diarrhea or loose stools. (Patient not taking: Reported on 01/26/2015) 60 tablet 1   No facility-administered medications prior to visit.    ROS Review of Systems  Constitutional: Negative for appetite change.  Musculoskeletal: Positive for neck pain and neck stiffness.  Neurological: Positive for headaches.  Psychiatric/Behavioral: The patient is nervous/anxious.     Objective:  Wt 106 lb (48.081 kg)  BP Readings from Last 3 Encounters:  12/30/14 138/74  12/07/14 120/80  11/05/14 120/68    Wt Readings from Last 3 Encounters:  01/26/15 106 lb (48.081 kg)  12/30/14 104 lb (47.174 kg)  12/07/14 101 lb (45.813 kg)      Physical Exam  Cardiovascular: Normal rate.  Exam reveals no gallop.   Pulmonary/Chest: No respiratory distress.  Musculoskeletal: She exhibits tenderness. She exhibits no edema.  Skin: No rash noted. No erythema.  Psychiatric: She has a normal mood and affect.  Neck tender w/ROM, traps are tender  Lab Results  Component Value Date   WBC 14.6* 09/29/2014   HGB 13.4 09/29/2014   HCT 39.6 09/29/2014   PLT 233.0 09/29/2014   GLUCOSE 125* 09/29/2014   CHOL 221* 05/12/2013   TRIG 68.0 05/12/2013   HDL 58.90 05/12/2013   LDLDIRECT 145.7 05/12/2013   LDLCALC 125* 05/07/2012   ALT 15 03/05/2014   AST 17 03/05/2014   NA 137 09/29/2014   K 3.8 09/29/2014   CL 105 09/29/2014   CREATININE 0.68 09/29/2014   BUN 22 09/29/2014   CO2 27 09/29/2014   TSH 2.76 03/05/2014    Dg Foot Complete Right  10/15/2014   CLINICAL DATA:  Swelling for 1 month.  Status post fall.  EXAM: RIGHT FOOT COMPLETE - 3+ VIEW  COMPARISON:  None.  FINDINGS: There is no evidence of fracture or dislocation. There is no evidence of arthropathy or other focal bone abnormality. Soft tissues are unremarkable. Incidental Achilles and plantar spurs.  IMPRESSION: Negative.   Electronically Signed   By: Staci Righter M.D.   On: 10/15/2014 08:31    Assessment &  Plan:   There are no diagnoses linked to this encounter. I am having Ms. Daywalt maintain her Vitamin D3, acetaminophen, guaiFENesin, saccharomyces boulardii, diphenoxylate-atropine, ALPRAZolam, and dicyclomine.  No orders of the defined types were placed in this encounter.     Follow-up: No Follow-up on file.  Walker Kehr, MD

## 2015-02-03 ENCOUNTER — Ambulatory Visit (INDEPENDENT_AMBULATORY_CARE_PROVIDER_SITE_OTHER): Payer: Medicare Other | Admitting: Psychology

## 2015-02-03 DIAGNOSIS — F4322 Adjustment disorder with anxiety: Secondary | ICD-10-CM | POA: Diagnosis not present

## 2015-02-09 ENCOUNTER — Ambulatory Visit (INDEPENDENT_AMBULATORY_CARE_PROVIDER_SITE_OTHER): Payer: Medicare Other | Admitting: Adult Health

## 2015-02-09 ENCOUNTER — Encounter: Payer: Self-pay | Admitting: Adult Health

## 2015-02-09 DIAGNOSIS — J0140 Acute pansinusitis, unspecified: Secondary | ICD-10-CM

## 2015-02-09 DIAGNOSIS — R112 Nausea with vomiting, unspecified: Secondary | ICD-10-CM | POA: Diagnosis not present

## 2015-02-09 MED ORDER — ONDANSETRON HCL 4 MG PO TABS: 4.0000 mg | ORAL_TABLET | Freq: Three times a day (TID) | ORAL | Status: DC | PRN

## 2015-02-09 NOTE — Progress Notes (Signed)
Subjective:    Patient ID: Julie Montgomery, female    DOB: 18-Jul-1935, 79 y.o.   MRN: [id number]  HPI  79 year old female who presents to the office today for sore throat and sinus pain and pressure with associated headache that started 24 hours ago. She is having a hard time swallowing and it feels like " I am swallowing glass" She has a wet non productive cough that started yesterday as well.   Endorses nausea but no vomiting. No diarrhea  Denies fever, chills or diarrhea. No sick contacts  Review of Systems  Constitutional: Positive for fatigue. Negative for fever, chills and diaphoresis.  HENT: Positive for congestion, sinus pressure, sore throat and trouble swallowing. Negative for ear discharge, ear pain, postnasal drip and rhinorrhea.   Eyes: Negative.   Respiratory: Positive for cough and shortness of breath. Negative for apnea, choking, chest tightness and wheezing.   Cardiovascular: Negative.   Musculoskeletal: Positive for myalgias. Negative for arthralgias and gait problem.  Skin: Negative.   Neurological: Positive for headaches. Negative for dizziness and weakness.  All other systems reviewed and are negative.  Past Medical History  Diagnosis Date  . Osteopenia   . Anxiety   . Pneumonia     hx of 03/2008    Social History   Social History  . Marital Status: Married    Spouse Name: N/A  . Number of Children: 4  . Years of Education: N/A   Occupational History  . Substitute Teacher    Social History Main Topics  . Smoking status: Never Smoker   . Smokeless tobacco: Never Used  . Alcohol Use: No  . Drug Use: No  . Sexual Activity: Not on file   Other Topics Concern  . Not on file   Social History Narrative   Married, Retired, she was looking after her GGD, now in court battle    Past Surgical History  Procedure Laterality Date  . Partial hysterectomy    . Breast lumpectomy      right  . Appendectomy    . Cesarean section    . Cholecystectomy     . Rotator cuff repair  2012    Family History  Problem Relation Age of Onset  . Diabetes Sister   . Cancer Sister     lung ca  . Coronary artery disease Brother   . Dementia Brother   . Cancer Brother     lung ca    Allergies  Allergen Reactions  . Fluoxetine Hcl     REACTION: jumpy  . Lovastatin   . Mometasone Furo-Formoterol Fum     Loss of appetite, laryngitis   . Sulfa Antibiotics     Current Outpatient Prescriptions on File Prior to Visit  Medication Sig Dispense Refill  . acetaminophen (TYLENOL) 500 MG tablet Take 500 mg by mouth every 6 (six) hours as needed for mild pain.     . Cholecalciferol (VITAMIN D3) 1000 UNITS CAPS Take 1 capsule by mouth daily. Take total of 2 daily    . dicyclomine (BENTYL) 10 MG capsule TAKE 1 CAPSULE BY MOUTH 4 TIMES A DAY BEFORE MEALS AND AT BEDTIME  0  . guaiFENesin (MUCINEX) 600 MG 12 hr tablet Take by mouth 2 (two) times daily.    Marland Kitchen saccharomyces boulardii (FLORASTOR) 250 MG capsule Take 1 capsule (250 mg total) by mouth 2 (two) times daily. (Patient taking differently: Take 250 mg by mouth daily. ) 60 capsule 1  .  ALPRAZolam (XANAX) 0.25 MG tablet Take 1 tablet (0.25 mg total) by mouth 2 (two) times daily as needed for anxiety or sleep. (Patient not taking: Reported on 02/09/2015) 60 tablet 1   No current facility-administered medications on file prior to visit.    BP 108/68 mmHg  Pulse 68  Temp(Src) 98.5 F (36.9 C) (Oral)  Ht 5\' 3"  (1.6 m)  Wt 105 lb 9.6 oz (47.9 kg)  BMI 18.71 kg/m2  SpO2 98%       Objective:   Physical Exam  Constitutional: She is oriented to person, place, and time. She appears well-developed and well-nourished.  HENT:  Head: Normocephalic and atraumatic.  Right Ear: External ear normal.  Left Ear: External ear normal.  Nose: Nose normal.  Mouth/Throat: Oropharynx is clear and moist. No oropharyngeal exudate.  Eyes: Conjunctivae are normal. Pupils are equal, round, and reactive to light. Right  eye exhibits no discharge. Left eye exhibits no discharge.  Neck: Neck supple. No thyromegaly present.  Cardiovascular: Normal rate, regular rhythm, normal heart sounds and intact distal pulses.  Exam reveals no gallop and no friction rub.   No murmur heard. Pulmonary/Chest: Effort normal and breath sounds normal.  Abdominal: Soft. Bowel sounds are normal. She exhibits no distension and no mass. There is no tenderness. There is no rebound and no guarding.  Musculoskeletal: Normal range of motion. She exhibits no edema or tenderness.  Lymphadenopathy:    She has cervical adenopathy.  Neurological: She is alert and oriented to person, place, and time.  Skin: Skin is warm and dry. No erythema. No pallor.  Psychiatric: She has a normal mood and affect. Her behavior is normal. Judgment and thought content normal.  Nursing note and vitals reviewed.      Assessment & Plan:  1. Acute pansinusitis, recurrence not specified Likely viral in nature Oral decongestants like phenylephrine or pseudoephedrine Nasal sinus rinses with saline Anti-inflammatory medications, like ibuprofen or tylenol Vaporizer or humidifier Hot baths/showers Hot liquids to sip on all day long Follow up if no improvement in 2-3 days   2. Nausea and vomiting, intractability of vomiting not specified, unspecified vomiting type - ondansetron (ZOFRAN) 4 MG tablet; Take 1 tablet (4 mg total) by mouth every 8 (eight) hours as needed for nausea or vomiting.  Dispense: 20 tablet; Refill: 0

## 2015-02-09 NOTE — Progress Notes (Signed)
Pre visit review using our clinic review tool, if applicable. No additional management support is needed unless otherwise documented below in the visit note. 

## 2015-02-09 NOTE — Patient Instructions (Addendum)
It was great meeting you today and I am sorry you are feeling so badly.   Your exam is consistent with a sinus infection, more than likely it is viral in nature. Antibiotics will not be effective against this type of infection.   Here are some ways to treat your sinus symptoms without antibiotics   Oral decongestants like phenylephrine or pseudoephedrine  Nasal sinus rinses with saline  Anti-inflammatory medications, like ibuprofen or tylenol  Vaporizer or humidifier  Hot baths/showers  Hot liquids to sip on all day long  Follow up if no improvement in 2-3 days  Sinusitis, Adult Sinusitis is redness, soreness, and inflammation of the paranasal sinuses. Paranasal sinuses are air pockets within the bones of your face. They are located beneath your eyes, in the middle of your forehead, and above your eyes. In healthy paranasal sinuses, mucus is able to drain out, and air is able to circulate through them by way of your nose. However, when your paranasal sinuses are inflamed, mucus and air can become trapped. This can allow bacteria and other germs to grow and cause infection. Sinusitis can develop quickly and last only a short time (acute) or continue over a long period (chronic). Sinusitis that lasts for more than 12 weeks is considered chronic. CAUSES Causes of sinusitis include:  Allergies.  Structural abnormalities, such as displacement of the cartilage that separates your nostrils (deviated septum), which can decrease the air flow through your nose and sinuses and affect sinus drainage.  Functional abnormalities, such as when the small hairs (cilia) that line your sinuses and help remove mucus do not work properly or are not present. SIGNS AND SYMPTOMS Symptoms of acute and chronic sinusitis are the same. The primary symptoms are pain and pressure around the affected sinuses. Other symptoms include:  Upper toothache.  Earache.  Headache.  Bad breath.  Decreased sense of  smell and taste.  A cough, which worsens when you are lying flat.  Fatigue.  Fever.  Thick drainage from your nose, which often is green and may contain pus (purulent).  Swelling and warmth over the affected sinuses. DIAGNOSIS Your health care provider will perform a physical exam. During your exam, your health care provider may perform any of the following to help determine if you have acute sinusitis or chronic sinusitis:  Look in your nose for signs of abnormal growths in your nostrils (nasal polyps).  Tap over the affected sinus to check for signs of infection.  View the inside of your sinuses using an imaging device that has a light attached (endoscope). If your health care provider suspects that you have chronic sinusitis, one or more of the following tests may be recommended:  Allergy tests.  Nasal culture. A sample of mucus is taken from your nose, sent to a lab, and screened for bacteria.  Nasal cytology. A sample of mucus is taken from your nose and examined by your health care provider to determine if your sinusitis is related to an allergy. TREATMENT Most cases of acute sinusitis are related to a viral infection and will resolve on their own within 10 days. Sometimes, medicines are prescribed to help relieve symptoms of both acute and chronic sinusitis. These may include pain medicines, decongestants, nasal steroid sprays, or saline sprays. However, for sinusitis related to a bacterial infection, your health care provider will prescribe antibiotic medicines. These are medicines that will help kill the bacteria causing the infection. Rarely, sinusitis is caused by a fungal infection. In these cases,  your health care provider will prescribe antifungal medicine. For some cases of chronic sinusitis, surgery is needed. Generally, these are cases in which sinusitis recurs more than 3 times per year, despite other treatments. HOME CARE INSTRUCTIONS  Drink plenty of water. Water  helps thin the mucus so your sinuses can drain more easily.  Use a humidifier.  Inhale steam 3-4 times a day (for example, sit in the bathroom with the shower running).  Apply a warm, moist washcloth to your face 3-4 times a day, or as directed by your health care provider.  Use saline nasal sprays to help moisten and clean your sinuses.  Take medicines only as directed by your health care provider.  If you were prescribed either an antibiotic or antifungal medicine, finish it all even if you start to feel better. SEEK IMMEDIATE MEDICAL CARE IF:  You have increasing pain or severe headaches.  You have nausea, vomiting, or drowsiness.  You have swelling around your face.  You have vision problems.  You have a stiff neck.  You have difficulty breathing.   This information is not intended to replace advice given to you by your health care provider. Make sure you discuss any questions you have with your health care provider.   Document Released: 04/16/2005 Document Revised: 05/07/2014 Document Reviewed: 05/01/2011 Elsevier Interactive Patient Education [employer].

## 2015-02-17 ENCOUNTER — Ambulatory Visit (INDEPENDENT_AMBULATORY_CARE_PROVIDER_SITE_OTHER): Payer: Medicare Other | Admitting: Psychology

## 2015-02-17 DIAGNOSIS — F4322 Adjustment disorder with anxiety: Secondary | ICD-10-CM | POA: Diagnosis not present

## 2015-03-18 ENCOUNTER — Telehealth: Payer: Self-pay | Admitting: Physician Assistant

## 2015-03-18 NOTE — Telephone Encounter (Signed)
Spoke with the patient. She was seen earlier this year for diarrhea. A colonoscopy was planned until it was found she had C-diff. She is no longer on atb's or having any symptoms. She states she had a screening colonoscopy 10 years ago. She states she has a family history of gastric cancers. Appointment scheduled for the patient to come in and discuss her options.

## 2015-03-18 NOTE — Telephone Encounter (Signed)
patient is wanting to know when she can have a colonoscopy. she states that she had been diagnosed with c. diff earlier in the year and the colonoscopy had to be cancelled. she is wanting to talk to a nurse about the different types of procedures that we do.

## 2015-03-22 DIAGNOSIS — L821 Other seborrheic keratosis: Secondary | ICD-10-CM | POA: Diagnosis not present

## 2015-03-22 DIAGNOSIS — D485 Neoplasm of uncertain behavior of skin: Secondary | ICD-10-CM | POA: Diagnosis not present

## 2015-03-22 DIAGNOSIS — L853 Xerosis cutis: Secondary | ICD-10-CM | POA: Diagnosis not present

## 2015-03-22 DIAGNOSIS — Z85828 Personal history of other malignant neoplasm of skin: Secondary | ICD-10-CM | POA: Diagnosis not present

## 2015-03-22 DIAGNOSIS — C4441 Basal cell carcinoma of skin of scalp and neck: Secondary | ICD-10-CM | POA: Diagnosis not present

## 2015-03-24 ENCOUNTER — Encounter: Payer: Self-pay | Admitting: Internal Medicine

## 2015-03-31 ENCOUNTER — Ambulatory Visit (INDEPENDENT_AMBULATORY_CARE_PROVIDER_SITE_OTHER): Payer: Medicare Other | Admitting: Internal Medicine

## 2015-03-31 ENCOUNTER — Encounter: Payer: Self-pay | Admitting: Internal Medicine

## 2015-03-31 VITALS — BP 112/64 | HR 65 | Temp 97.6°F | Resp 12 | Ht 62.0 in | Wt 105.0 lb

## 2015-03-31 DIAGNOSIS — F4323 Adjustment disorder with mixed anxiety and depressed mood: Secondary | ICD-10-CM

## 2015-03-31 DIAGNOSIS — F411 Generalized anxiety disorder: Secondary | ICD-10-CM

## 2015-03-31 DIAGNOSIS — A0472 Enterocolitis due to Clostridium difficile, not specified as recurrent: Secondary | ICD-10-CM

## 2015-03-31 DIAGNOSIS — J3089 Other allergic rhinitis: Secondary | ICD-10-CM

## 2015-03-31 DIAGNOSIS — A047 Enterocolitis due to Clostridium difficile: Secondary | ICD-10-CM

## 2015-03-31 NOTE — Assessment & Plan Note (Signed)
Discussed.

## 2015-03-31 NOTE — Assessment & Plan Note (Signed)
On Mucinex 

## 2015-03-31 NOTE — Progress Notes (Signed)
Pre visit review using our clinic review tool, if applicable. No additional management support is needed unless otherwise documented below in the visit note. 

## 2015-03-31 NOTE — Assessment & Plan Note (Signed)
Chronic Alprazolam prn - rare

## 2015-03-31 NOTE — Assessment & Plan Note (Signed)
F/u w/GI 

## 2015-03-31 NOTE — Progress Notes (Signed)
Subjective:  Patient ID: Julie Montgomery, female    DOB: 1935/07/28  Age: 79 y.o. MRN: [id number]  CC: No chief complaint on file.   HPI Julie Montgomery presents for anxiety, wt loss, recent colitis f/u.  Outpatient Prescriptions Prior to Visit  Medication Sig Dispense Refill  . acetaminophen (TYLENOL) 500 MG tablet Take 500 mg by mouth every 6 (six) hours as needed for mild pain.     Marland Kitchen ALPRAZolam (XANAX) 0.25 MG tablet Take 1 tablet (0.25 mg total) by mouth 2 (two) times daily as needed for anxiety or sleep. 60 tablet 1  . Cholecalciferol (VITAMIN D3) 1000 UNITS CAPS Take 1 capsule by mouth daily. Take total of 2 daily    . dicyclomine (BENTYL) 10 MG capsule TAKE 1 CAPSULE BY MOUTH 4 TIMES A DAY BEFORE MEALS AND AT BEDTIME  0  . guaiFENesin (MUCINEX) 600 MG 12 hr tablet Take by mouth 2 (two) times daily.    . ondansetron (ZOFRAN) 4 MG tablet Take 1 tablet (4 mg total) by mouth every 8 (eight) hours as needed for nausea or vomiting. 20 tablet 0  . saccharomyces boulardii (FLORASTOR) 250 MG capsule Take 1 capsule (250 mg total) by mouth 2 (two) times daily. (Patient taking differently: Take 250 mg by mouth daily. ) 60 capsule 1   No facility-administered medications prior to visit.    ROS Review of Systems  Constitutional: Negative for chills, activity change, appetite change, fatigue and unexpected weight change.  HENT: Negative for congestion, mouth sores and sinus pressure.   Eyes: Negative for visual disturbance.  Respiratory: Negative for cough, chest tightness and shortness of breath.   Cardiovascular: Negative for chest pain.  Gastrointestinal: Negative for nausea, vomiting, abdominal pain, diarrhea and abdominal distention.  Genitourinary: Negative for frequency, difficulty urinating and vaginal pain.  Musculoskeletal: Negative for back pain and gait problem.  Skin: Negative for pallor and rash.  Neurological: Negative for dizziness, tremors, weakness, numbness and  headaches.  Psychiatric/Behavioral: Positive for dysphoric mood. Negative for suicidal ideas, confusion, sleep disturbance, self-injury and decreased concentration. The patient is nervous/anxious.     Objective:  BP 112/64 mmHg  Pulse 65  Temp(Src) 97.6 F (36.4 C) (Oral)  Resp 12  Ht 5\' 2"  (1.575 m)  Wt 105 lb (47.628 kg)  BMI 19.20 kg/m2  SpO2 97%  BP Readings from Last 3 Encounters:  03/31/15 112/64  02/09/15 108/68  01/26/15 142/72    Wt Readings from Last 3 Encounters:  03/31/15 105 lb (47.628 kg)  02/09/15 105 lb 9.6 oz (47.9 kg)  01/26/15 106 lb (48.081 kg)    Physical Exam  Constitutional: She appears well-developed. No distress.  HENT:  Head: Normocephalic.  Right Ear: External ear normal.  Left Ear: External ear normal.  Nose: Nose normal.  Mouth/Throat: Oropharynx is clear and moist.  Eyes: Conjunctivae are normal. Pupils are equal, round, and reactive to light. Right eye exhibits no discharge. Left eye exhibits no discharge.  Neck: Normal range of motion. Neck supple. No JVD present. No tracheal deviation present. No thyromegaly present.  Cardiovascular: Normal rate, regular rhythm and normal heart sounds.   Pulmonary/Chest: No stridor. No respiratory distress. She has no wheezes.  Abdominal: Soft. Bowel sounds are normal. She exhibits no distension and no mass. There is no tenderness. There is no rebound and no guarding.  Musculoskeletal: She exhibits no edema or tenderness.  Lymphadenopathy:    She has no cervical adenopathy.  Neurological: She displays normal reflexes. No  cranial nerve deficit. She exhibits normal muscle tone. Coordination normal.  Skin: No rash noted. No erythema.  Psychiatric: She has a normal mood and affect. Her behavior is normal. Judgment and thought content normal.  Thin  Lab Results  Component Value Date   WBC 14.6* 09/29/2014   HGB 13.4 09/29/2014   HCT 39.6 09/29/2014   PLT 233.0 09/29/2014   GLUCOSE 125* 09/29/2014    CHOL 221* 05/12/2013   TRIG 68.0 05/12/2013   HDL 58.90 05/12/2013   LDLDIRECT 145.7 05/12/2013   LDLCALC 125* 05/07/2012   ALT 15 03/05/2014   AST 17 03/05/2014   NA 137 09/29/2014   K 3.8 09/29/2014   CL 105 09/29/2014   CREATININE 0.68 09/29/2014   BUN 22 09/29/2014   CO2 27 09/29/2014   TSH 2.76 03/05/2014    Dg Foot Complete Right  10/15/2014  CLINICAL DATA:  Swelling for 1 month.  Status post fall. EXAM: RIGHT FOOT COMPLETE - 3+ VIEW COMPARISON:  None. FINDINGS: There is no evidence of fracture or dislocation. There is no evidence of arthropathy or other focal bone abnormality. Soft tissues are unremarkable. Incidental Achilles and plantar spurs. IMPRESSION: Negative. Electronically Signed   By: Staci Righter M.D.   On: 10/15/2014 08:31    Assessment & Plan:   Diagnoses and all orders for this visit:  Other allergic rhinitis  Clostridium difficile colitis  Generalized anxiety disorder  Situational mixed anxiety and depressive disorder  I am having Julie Montgomery maintain her Vitamin D3, acetaminophen, guaiFENesin, saccharomyces boulardii, ALPRAZolam, dicyclomine, and ondansetron.  No orders of the defined types were placed in this encounter.     Follow-up: Return in about 3 months (around 06/29/2015) for a follow-up visit.  Walker Kehr, MD

## 2015-04-13 ENCOUNTER — Ambulatory Visit (INDEPENDENT_AMBULATORY_CARE_PROVIDER_SITE_OTHER): Payer: Medicare Other | Admitting: Gastroenterology

## 2015-04-13 ENCOUNTER — Encounter: Payer: Self-pay | Admitting: Gastroenterology

## 2015-04-13 VITALS — BP 120/60 | HR 96 | Ht 62.0 in | Wt 106.0 lb

## 2015-04-13 DIAGNOSIS — Z1211 Encounter for screening for malignant neoplasm of colon: Secondary | ICD-10-CM

## 2015-04-13 DIAGNOSIS — L821 Other seborrheic keratosis: Secondary | ICD-10-CM | POA: Diagnosis not present

## 2015-04-13 DIAGNOSIS — L820 Inflamed seborrheic keratosis: Secondary | ICD-10-CM | POA: Diagnosis not present

## 2015-04-13 DIAGNOSIS — Z85828 Personal history of other malignant neoplasm of skin: Secondary | ICD-10-CM | POA: Diagnosis not present

## 2015-04-13 MED ORDER — NA SULFATE-K SULFATE-MG SULF 17.5-3.13-1.6 GM/177ML PO SOLN: 1.0000 | Freq: Once | ORAL | Status: DC

## 2015-04-13 NOTE — Patient Instructions (Signed)
You have been scheduled for a colonoscopy. Please follow written instructions given to you at your visit today.  Please pick up your prep supplies at the pharmacy within the next 1-3 days. If you use inhalers (even only as needed), please bring them with you on the day of your procedure. Your physician has requested that you go to www.startemmi.com and enter the access code given to you at your visit today. This web site gives a general overview about your procedure. However, you should still follow specific instructions given to you by our office regarding your preparation for the procedure.  Thank you for choosing me and [facility].  Malcolm T. Stark, Jr., MD., FACG  

## 2015-04-13 NOTE — Progress Notes (Signed)
    History of Present Illness: This is a 79 year old female presenting for colon cancer screening and intermittent abdominal bloating. She has a history of C. difficile which was treated this summer. She states she lost about 10 pounds with the C. difficile infection and has regained about 3 of those pounds. States she underwent colonoscopy about 10 or 12 years ago. There is a record showing contact with Dr. Earle Gell in 2002 but no information regarding the nature of that contact. Denies weight loss, abdominal pain, constipation, diarrhea, change in stool caliber, melena, hematochezia, nausea, vomiting, dysphagia, reflux symptoms, chest pain.   Current Medications, Allergies, Past Medical History, Past Surgical History, Family History and Social History were reviewed in [employer] record.  Physical Exam: General: Well developed, well nourished, no acute distress Head: Normocephalic and atraumatic Eyes:  sclerae anicteric, EOMI Ears: Normal auditory acuity Mouth: No deformity or lesions Lungs: Clear throughout to auscultation Heart: Regular rate and rhythm; no murmurs, rubs or bruits Abdomen: Soft, non tender and non distended. No masses, hepatosplenomegaly or hernias noted. Normal Bowel sounds Rectal: deferred to colonoscopy Musculoskeletal: Symmetrical with no gross deformities  Pulses:  Normal pulses noted Extremities: No clubbing, cyanosis, edema or deformities noted Neurological: Alert oriented x 4, grossly nonfocal Psychological:  Alert and cooperative. Normal mood and affect  Assessment and Recommendations:  1. CRC screening, average risk. The risks (including bleeding, perforation, infection, missed lesions, medication reactions and possible hospitalization or surgery if complications occur), benefits, and alternatives to colonoscopy with possible biopsy and possible polypectomy were discussed with the patient and they consent to proceed.   2.  Intermittent, mild abdominal bloating. Tums prn, Gas-x qid prn and antireflux measures when necessary.

## 2015-05-29 ENCOUNTER — Encounter: Payer: Self-pay | Admitting: Gastroenterology

## 2015-05-30 ENCOUNTER — Encounter: Payer: Self-pay | Admitting: Gastroenterology

## 2015-05-31 ENCOUNTER — Ambulatory Visit (AMBULATORY_SURGERY_CENTER): Payer: Medicare Other | Admitting: Gastroenterology

## 2015-05-31 ENCOUNTER — Encounter: Payer: Self-pay | Admitting: Gastroenterology

## 2015-05-31 VITALS — BP 124/67 | HR 65 | Temp 96.8°F | Resp 25 | Ht 62.0 in | Wt 106.0 lb

## 2015-05-31 DIAGNOSIS — Z1211 Encounter for screening for malignant neoplasm of colon: Secondary | ICD-10-CM

## 2015-05-31 MED ORDER — SODIUM CHLORIDE 0.9 % IV SOLN: 500.0000 mL | INTRAVENOUS | Status: DC

## 2015-05-31 NOTE — Patient Instructions (Signed)
YOU HAD AN ENDOSCOPIC PROCEDURE TODAY AT THE [facility]:   Refer to the procedure report that was given to you for any specific questions about what was found during the examination.  If the procedure report does not answer your questions, please call your gastroenterologist to clarify.  If you requested that your care partner not be given the details of your procedure findings, then the procedure report has been included in a sealed envelope for you to review at your convenience later.  YOU SHOULD EXPECT: Some feelings of bloating in the abdomen. Passage of more gas than usual.  Walking can help get rid of the air that was put into your GI tract during the procedure and reduce the bloating. If you had a lower endoscopy (such as a colonoscopy or flexible sigmoidoscopy) you may notice spotting of blood in your stool or on the toilet paper. If you underwent a bowel prep for your procedure, you may not have a normal bowel movement for a few days.  Please Note:  You might notice some irritation and congestion in your nose or some drainage.  This is from the oxygen used during your procedure.  There is no need for concern and it should clear up in a day or so.  SYMPTOMS TO REPORT IMMEDIATELY:   Following lower endoscopy (colonoscopy or flexible sigmoidoscopy):  Excessive amounts of blood in the stool  Significant tenderness or worsening of abdominal pains  Swelling of the abdomen that is new, acute  Fever of 100F or higher  For urgent or emergent issues, a gastroenterologist can be reached at any hour by calling [phone].   DIET: Your first meal following the procedure should be a small meal and then it is ok to progress to your normal diet. Heavy or fried foods are harder to digest and may make you feel nauseous or bloated.  Likewise, meals heavy in dairy and vegetables can increase bloating.  Drink plenty of fluids but you should avoid alcoholic beverages for 24 hours. Try to  increase the fiber in your diet.  ACTIVITY:  You should plan to take it easy for the rest of today and you should NOT DRIVE or use heavy machinery until tomorrow (because of the sedation medicines used during the test).    FOLLOW UP: Our staff will call the number listed on your records the next business day following your procedure to check on you and address any questions or concerns that you may have regarding the information given to you following your procedure. If we do not reach you, we will leave a message.  However, if you are feeling well and you are not experiencing any problems, there is no need to return our call.  We will assume that you have returned to your regular daily activities without incident.  If any biopsies were taken you will be contacted by phone or by letter within the next 1-3 weeks.  Please call us at [phone] if you have not heard about the biopsies in 3 weeks.    SIGNATURES/CONFIDENTIALITY: You and/or your care partner have signed paperwork which will be entered into your electronic medical record.  These signatures attest to the fact that that the information above on your After Visit Summary has been reviewed and is understood.  Full responsibility of the confidentiality of this discharge information lies with you and/or your care-partner.  Read all of the handouts given to you by your recovery room nurse. Thank-you for choosing us   for your healthcare needs. 

## 2015-05-31 NOTE — Progress Notes (Signed)
Report to PACU, RN, vss, BBS= Clear.  

## 2015-05-31 NOTE — Op Note (Signed)
[place]  [address] [place], [id number]   COLONOSCOPY PROCEDURE REPORT  PATIENT: Julie, Montgomery  MR#: [id number] BIRTHDATE: 1935/12/05 , 79  yrs. old GENDER: female ENDOSCOPIST: Ladene Artist, MD, [facility] REFERRED BY:  Creig Hines MD PROCEDURE DATE:  05/31/2015 PROCEDURE:   Colonoscopy, screening First Screening Colonoscopy - Avg.  risk and is 50 yrs.  old or older - No.  Prior Negative Screening - Now for repeat screening. 10 or more years since last screening  History of Adenoma - Now for follow-up colonoscopy & has been > or = to 3 yrs.  N/A  Polyps removed today? No Recommend repeat exam, <10 yrs? No ASA CLASS:   Class II INDICATIONS:Screening for colonic neoplasia and Colorectal Neoplasm Risk Assessment for this procedure is average risk. MEDICATIONS: Monitored anesthesia care and Propofol 100 mg IV DESCRIPTION OF PROCEDURE:   After the risks benefits and alternatives of the procedure were thoroughly explained, informed consent was obtained.  The digital rectal exam revealed no abnormalities of the rectum.   The [id number]  endoscope was introduced through the anus and advanced to the cecum, which was identified by both the appendix and ileocecal valve. No adverse events experienced.   The quality of the prep was good.  (Suprep was used)  The instrument was then slowly withdrawn as the colon was fully examined. Estimated blood loss is zero unless otherwise noted in this procedure report.    COLON FINDINGS: 6 mm angiodysplastic lesion was found in the ascending colon.   There was moderate diverticulosis noted in the sigmoid colon.   The colonic mucosa appeared normal at the splenic flexure, in the descending colon, rectum, transverse colon, at the cecum, ileocecal valve, and hepatic flexure.  Retroflexed views revealed no abnormalities. The time to cecum = 4.6 Withdrawal time = 8.3   The scope was withdrawn and the procedure  completed. COMPLICATIONS: There were no immediate complications.  ENDOSCOPIC IMPRESSION: 1.   Angiodysplastic lesion in the ascending colon 2.   Moderate diverticulosis in the sigmoid colon  RECOMMENDATIONS: 1.  High fiber diet with liberal fluid intake. 2.  Given your age, you will not need another colonoscopy for colon cancer screening or polyp surveillance.  These types of tests usually stop around the age 50.  eSigned:  Ladene Artist, MD, [facility] 05/31/2015 1:56 PM

## 2015-06-01 ENCOUNTER — Telehealth: Payer: Self-pay | Admitting: *Deleted

## 2015-06-01 NOTE — Telephone Encounter (Signed)
  Follow up Call-  Call back number 05/31/2015  Post procedure Call Back phone  # [phone]  Permission to leave phone message Yes     Patient questions:  Do you have a fever, pain , or abdominal swelling? No. Pain Score  0 *  Have you tolerated food without any problems? Yes.    Have you been able to return to your normal activities? Yes.    Do you have any questions about your discharge instructions: Diet   No. Medications  No. Follow up visit  No.  Do you have questions or concerns about your Care? No.  Actions: * If pain score is 4 or above: No action needed, pain <4.  Pt. Was getting out of shower,husband informed me of pt. Response.

## 2015-06-14 ENCOUNTER — Ambulatory Visit (INDEPENDENT_AMBULATORY_CARE_PROVIDER_SITE_OTHER): Payer: Medicare Other

## 2015-06-14 VITALS — BP 126/70 | Ht 62.5 in | Wt 105.0 lb

## 2015-06-14 DIAGNOSIS — Z0000 Encounter for general adult medical examination without abnormal findings: Secondary | ICD-10-CM | POA: Diagnosis not present

## 2015-06-14 NOTE — Patient Instructions (Addendum)
Julie Montgomery , Thank you for taking time to come for your Medicare Wellness Visit. I appreciate your ongoing commitment to your health goals. Please review the following plan we discussed and let me know if I can assist you in the future.   Will fup with Dr. Rexene Edison    These are the goals we discussed: Goals    . patient     Will see Dr. Rexene Edison to stabilize grief and constant demands of family and self care          This is a list of the screening recommended for you and due dates:  Health Maintenance  Topic Date Due  . Tetanus Vaccine  06/15/2015  . Flu Shot  11/29/2015  . DEXA scan (bone density measurement)  Completed  . Shingles Vaccine  Completed  . Pneumonia vaccines  Completed   Stress and Stress Management Stress is a normal reaction to life events. It is what you feel when life demands more than you are used to or more than you can handle. Some stress can be useful. For example, the stress reaction can help you catch the last bus of the day, study for a test, or meet a deadline at work. But stress that occurs too often or for too long can cause problems. It can affect your emotional health and interfere with relationships and normal daily activities. Too much stress can weaken your immune system and increase your risk for physical illness. If you already have a medical problem, stress can make it worse. CAUSES  All sorts of life events may cause stress. An event that causes stress for one person may not be stressful for another person. Major life events commonly cause stress. These may be positive or negative. Examples include losing your job, moving into a new home, getting married, having a baby, or losing a loved one. Less obvious life events may also cause stress, especially if they occur day after day or in combination. Examples include working long hours, driving in traffic, caring for children, being in debt, or being in a difficult relationship. SIGNS AND  SYMPTOMS Stress may cause emotional symptoms including, the following:  Anxiety. This is feeling worried, afraid, on edge, overwhelmed, or out of control.  Anger. This is feeling irritated or impatient.  Depression. This is feeling sad, down, helpless, or guilty.  Difficulty focusing, remembering, or making decisions. Stress may cause physical symptoms, including the following:   Aches and pains. These may affect your head, neck, back, stomach, or other areas of your body.  Tight muscles or clenched jaw.  Low energy or trouble sleeping. Stress may cause unhealthy behaviors, including the following:   Eating to feel better (overeating) or skipping meals.  Sleeping too little, too much, or both.  Working too much or putting off tasks (procrastination).  Smoking, drinking alcohol, or using drugs to feel better. DIAGNOSIS  Stress is diagnosed through an assessment by your health care provider. Your health care provider will ask questions about your symptoms and any stressful life events.Your health care provider will also ask about your medical history and may order blood tests or other tests. Certain medical conditions and medicine can cause physical symptoms similar to stress. Mental illness can cause emotional symptoms and unhealthy behaviors similar to stress. Your health care provider may refer you to a mental health professional for further evaluation.  TREATMENT  Stress management is the recommended treatment for stress.The goals of stress management are reducing stressful life events and  coping with stress in healthy ways.  Techniques for reducing stressful life events include the following:  Stress identification. Self-monitor for stress and identify what causes stress for you. These skills may help you to avoid some stressful events.  Time management. Set your priorities, keep a calendar of events, and learn to say "no." These tools can help you avoid making too many  commitments. Techniques for coping with stress include the following:  Rethinking the problem. Try to think realistically about stressful events rather than ignoring them or overreacting. Try to find the positives in a stressful situation rather than focusing on the negatives.  Exercise. Physical exercise can release both physical and emotional tension. The key is to find a form of exercise you enjoy and do it regularly.  Relaxation techniques. These relax the body and mind. Examples include yoga, meditation, tai chi, biofeedback, deep breathing, progressive muscle relaxation, listening to music, being out in nature, journaling, and other hobbies. Again, the key is to find one or more that you enjoy and can do regularly.  Healthy lifestyle. Eat a balanced diet, get plenty of sleep, and do not smoke. Avoid using alcohol or drugs to relax.  Strong support network. Spend time with family, friends, or other people you enjoy being around.Express your feelings and talk things over with someone you trust. Counseling or talktherapy with a mental health professional may be helpful if you are having difficulty managing stress on your own. Medicine is typically not recommended for the treatment of stress.Talk to your health care provider if you think you need medicine for symptoms of stress. HOME CARE INSTRUCTIONS  Keep all follow-up visits as directed by your health care provider.  Take all medicines as directed by your health care provider. SEEK MEDICAL CARE IF:  Your symptoms get worse or you start having new symptoms.  You feel overwhelmed by your problems and can no longer manage them on your own. SEEK IMMEDIATE MEDICAL CARE IF:  You feel like hurting yourself or someone else.   This information is not intended to replace advice given to you by your health care provider. Make sure you discuss any questions you have with your health care provider.   Document Released: 10/10/2000 Document  Revised: 05/07/2014 Document Reviewed: 12/09/2012 Elsevier Interactive Patient Education 2016 [place] in the Home  Falls can cause injuries. They can happen to people of all ages. There are many things you can do to make your home safe and to help prevent falls.  WHAT CAN I DO ON THE OUTSIDE OF MY HOME?  Regularly fix the edges of walkways and driveways and fix any cracks.  Remove anything that might make you trip as you walk through a door, such as a raised step or threshold.  Trim any bushes or trees on the path to your home.  Use bright outdoor lighting.  Clear any walking paths of anything that might make someone trip, such as rocks or tools.  Regularly check to see if handrails are loose or broken. Make sure that both sides of any steps have handrails.  Any raised decks and porches should have guardrails on the edges.  Have any leaves, snow, or ice cleared regularly.  Use sand or salt on walking paths during winter.  Clean up any spills in your garage right away. This includes oil or grease spills. WHAT CAN I DO IN THE BATHROOM?   Use night lights.  Install grab bars by the toilet and in  the tub and shower. Do not use towel bars as grab bars.  Use non-skid mats or decals in the tub or shower.  If you need to sit down in the shower, use a plastic, non-slip stool.  Keep the floor dry. Clean up any water that spills on the floor as soon as it happens.  Remove soap buildup in the tub or shower regularly.  Attach bath mats securely with double-sided non-slip rug tape.  Do not have throw rugs and other things on the floor that can make you trip. WHAT CAN I DO IN THE BEDROOM?  Use night lights.  Make sure that you have a light by your bed that is easy to reach.  Do not use any sheets or blankets that are too big for your bed. They should not hang down onto the floor.  Have a firm chair that has side arms. You can use this for support while  you get dressed.  Do not have throw rugs and other things on the floor that can make you trip. WHAT CAN I DO IN THE KITCHEN?  Clean up any spills right away.  Avoid walking on wet floors.  Keep items that you use a lot in easy-to-reach places.  If you need to reach something above you, use a strong step stool that has a grab bar.  Keep electrical cords out of the way.  Do not use floor polish or wax that makes floors slippery. If you must use wax, use non-skid floor wax.  Do not have throw rugs and other things on the floor that can make you trip. WHAT CAN I DO WITH MY STAIRS?  Do not leave any items on the stairs.  Make sure that there are handrails on both sides of the stairs and use them. Fix handrails that are broken or loose. Make sure that handrails are as long as the stairways.  Check any carpeting to make sure that it is firmly attached to the stairs. Fix any carpet that is loose or worn.  Avoid having throw rugs at the top or bottom of the stairs. If you do have throw rugs, attach them to the floor with carpet tape.  Make sure that you have a light switch at the top of the stairs and the bottom of the stairs. If you do not have them, ask someone to add them for you. WHAT ELSE CAN I DO TO HELP PREVENT FALLS?  Wear shoes that:  Do not have high heels.  Have rubber bottoms.  Are comfortable and fit you well.  Are closed at the toe. Do not wear sandals.  If you use a stepladder:  Make sure that it is fully opened. Do not climb a closed stepladder.  Make sure that both sides of the stepladder are locked into place.  Ask someone to hold it for you, if possible.  Clearly mark and make sure that you can see:  Any grab bars or handrails.  First and last steps.  Where the edge of each step is.  Use tools that help you move around (mobility aids) if they are needed. These include:  Canes.  Walkers.  Scooters.  Crutches.  Turn on the lights when you go  into a dark area. Replace any light bulbs as soon as they burn out.  Set up your furniture so you have a clear path. Avoid moving your furniture around.  If any of your floors are uneven, fix them.  If there are any  pets around you, be aware of where they are.  Review your medicines with your doctor. Some medicines can make you feel dizzy. This can increase your chance of falling. Ask your doctor what other things that you can do to help prevent falls.   This information is not intended to replace advice given to you by your health care provider. Make sure you discuss any questions you have with your health care provider.   Document Released: 02/10/2009 Document Revised: 08/31/2014 Document Reviewed: 05/21/2014 Elsevier Interactive Patient Education 2016 [place] Maintenance, Female Adopting a healthy lifestyle and getting preventive care can go a long way to promote health and wellness. Talk with your health care provider about what schedule of regular examinations is right for you. This is a good chance for you to check in with your provider about disease prevention and staying healthy. In between checkups, there are plenty of things you can do on your own. Experts have done a lot of research about which lifestyle changes and preventive measures are most likely to keep you healthy. Ask your health care provider for more information. WEIGHT AND DIET  Eat a healthy diet  Be sure to include plenty of vegetables, fruits, low-fat dairy products, and lean protein.  Do not eat a lot of foods high in solid fats, added sugars, or salt.  Get regular exercise. This is one of the most important things you can do for your health.  Most adults should exercise for at least 150 minutes each week. The exercise should increase your heart rate and make you sweat (moderate-intensity exercise).  Most adults should also do strengthening exercises at least twice a week. This is in addition to  the moderate-intensity exercise.  Maintain a healthy weight  Body mass index (BMI) is a measurement that can be used to identify possible weight problems. It estimates body fat based on height and weight. Your health care provider can help determine your BMI and help you achieve or maintain a healthy weight.  For females 10 years of age and older:   A BMI below 18.5 is considered underweight.  A BMI of 18.5 to 24.9 is normal.  A BMI of 25 to 29.9 is considered overweight.  A BMI of 30 and above is considered obese.  Watch levels of cholesterol and blood lipids  You should start having your blood tested for lipids and cholesterol at 78 years of age, then have this test every 5 years.  You may need to have your cholesterol levels checked more often if:  Your lipid or cholesterol levels are high.  You are older than 78 years of age.  You are at high risk for heart disease.  CANCER SCREENING   Lung Cancer  Lung cancer screening is recommended for adults 14-27 years old who are at high risk for lung cancer because of a history of smoking.  A yearly low-dose CT scan of the lungs is recommended for people who:  Currently smoke.  Have quit within the past 15 years.  Have at least a 30-pack-year history of smoking. A pack year is smoking an average of one pack of cigarettes a day for 1 year.  Yearly screening should continue until it has been 15 years since you quit.  Yearly screening should stop if you develop a health problem that would prevent you from having lung cancer treatment.  Breast Cancer  Practice breast self-awareness. This means understanding how your breasts normally appear and feel.  It  also means doing regular breast self-exams. Let your health care provider know about any changes, no matter how small.  If you are in your 20s or 30s, you should have a clinical breast exam (CBE) by a health care provider every 1-3 years as part of a regular health  exam.  If you are 20 or older, have a CBE every year. Also consider having a breast X-ray (mammogram) every year.  If you have a family history of breast cancer, talk to your health care provider about genetic screening.  If you are at high risk for breast cancer, talk to your health care provider about having an MRI and a mammogram every year.  Breast cancer gene (BRCA) assessment is recommended for women who have family members with BRCA-related cancers. BRCA-related cancers include:  Breast.  Ovarian.  Tubal.  Peritoneal cancers.  Results of the assessment will determine the need for genetic counseling and BRCA1 and BRCA2 testing. Cervical Cancer Your health care provider may recommend that you be screened regularly for cancer of the pelvic organs (ovaries, uterus, and vagina). This screening involves a pelvic examination, including checking for microscopic changes to the surface of your cervix (Pap test). You may be encouraged to have this screening done every 3 years, beginning at age 52.  For women ages 64-65, health care providers may recommend pelvic exams and Pap testing every 3 years, or they may recommend the Pap and pelvic exam, combined with testing for human papilloma virus (HPV), every 5 years. Some types of HPV increase your risk of cervical cancer. Testing for HPV may also be done on women of any age with unclear Pap test results.  Other health care providers may not recommend any screening for nonpregnant women who are considered low risk for pelvic cancer and who do not have symptoms. Ask your health care provider if a screening pelvic exam is right for you.  If you have had past treatment for cervical cancer or a condition that could lead to cancer, you need Pap tests and screening for cancer for at least 20 years after your treatment. If Pap tests have been discontinued, your risk factors (such as having a new sexual partner) need to be reassessed to determine if  screening should resume. Some women have medical problems that increase the chance of getting cervical cancer. In these cases, your health care provider may recommend more frequent screening and Pap tests. Colorectal Cancer  This type of cancer can be detected and often prevented.  Routine colorectal cancer screening usually begins at 78 years of age and continues through 78 years of age.  Your health care provider may recommend screening at an earlier age if you have risk factors for colon cancer.  Your health care provider may also recommend using home test kits to check for hidden blood in the stool.  A small camera at the end of a tube can be used to examine your colon directly (sigmoidoscopy or colonoscopy). This is done to check for the earliest forms of colorectal cancer.  Routine screening usually begins at age 68.  Direct examination of the colon should be repeated every 5-10 years through 78 years of age. However, you may need to be screened more often if early forms of precancerous polyps or small growths are found. Skin Cancer  Check your skin from head to toe regularly.  Tell your health care provider about any new moles or changes in moles, especially if there is a change in a mole's  shape or color.  Also tell your health care provider if you have a mole that is larger than the size of a pencil eraser.  Always use sunscreen. Apply sunscreen liberally and repeatedly throughout the day.  Protect yourself by wearing long sleeves, pants, a wide-brimmed hat, and sunglasses whenever you are outside. HEART DISEASE, DIABETES, AND HIGH BLOOD PRESSURE   High blood pressure causes heart disease and increases the risk of stroke. High blood pressure is more likely to develop in:  People who have blood pressure in the high end of the normal range (130-139/85-89 mm Hg).  People who are overweight or obese.  People who are African American.  If you are 35-25 years of age, have your  blood pressure checked every 3-5 years. If you are 43 years of age or older, have your blood pressure checked every year. You should have your blood pressure measured twice--once when you are at a hospital or clinic, and once when you are not at a hospital or clinic. Record the average of the two measurements. To check your blood pressure when you are not at a hospital or clinic, you can use:  An automated blood pressure machine at a pharmacy.  A home blood pressure monitor.  If you are between 38 years and 87 years old, ask your health care provider if you should take aspirin to prevent strokes.  Have regular diabetes screenings. This involves taking a blood sample to check your fasting blood sugar level.  If you are at a normal weight and have a low risk for diabetes, have this test once every three years after 78 years of age.  If you are overweight and have a high risk for diabetes, consider being tested at a younger age or more often. PREVENTING INFECTION  Hepatitis B  If you have a higher risk for hepatitis B, you should be screened for this virus. You are considered at high risk for hepatitis B if:  You were born in a country where hepatitis B is common. Ask your health care provider which countries are considered high risk.  Your parents were born in a high-risk country, and you have not been immunized against hepatitis B (hepatitis B vaccine).  You have HIV or AIDS.  You use needles to inject street drugs.  You live with someone who has hepatitis B.  You have had sex with someone who has hepatitis B.  You get hemodialysis treatment.  You take certain medicines for conditions, including cancer, organ transplantation, and autoimmune conditions. Hepatitis C  Blood testing is recommended for:  Everyone born from [birth year] through 1963.  Anyone with known risk factors for hepatitis C. Sexually transmitted infections (STIs)  You should be screened for sexually transmitted  infections (STIs) including gonorrhea and chlamydia if:  You are sexually active and are younger than 78 years of age.  You are older than 78 years of age and your health care provider tells you that you are at risk for this type of infection.  Your sexual activity has changed since you were last screened and you are at an increased risk for chlamydia or gonorrhea. Ask your health care provider if you are at risk.  If you do not have HIV, but are at risk, it may be recommended that you take a prescription medicine daily to prevent HIV infection. This is called pre-exposure prophylaxis (PrEP). You are considered at risk if:  You are sexually active and do not regularly use condoms or know the  HIV status of your partner(s).  You take drugs by injection.  You are sexually active with a partner who has HIV. Talk with your health care provider about whether you are at high risk of being infected with HIV. If you choose to begin PrEP, you should first be tested for HIV. You should then be tested every 3 months for as long as you are taking PrEP.  PREGNANCY   If you are premenopausal and you may become pregnant, ask your health care provider about preconception counseling.  If you may become pregnant, take 400 to 800 micrograms (mcg) of folic acid every day.  If you want to prevent pregnancy, talk to your health care provider about birth control (contraception). OSTEOPOROSIS AND MENOPAUSE   Osteoporosis is a disease in which the bones lose minerals and strength with aging. This can result in serious bone fractures. Your risk for osteoporosis can be identified using a bone density scan.  If you are 7 years of age or older, or if you are at risk for osteoporosis and fractures, ask your health care provider if you should be screened.  Ask your health care provider whether you should take a calcium or vitamin D supplement to lower your risk for osteoporosis.  Menopause may have certain physical  symptoms and risks.  Hormone replacement therapy may reduce some of these symptoms and risks. Talk to your health care provider about whether hormone replacement therapy is right for you.  HOME CARE INSTRUCTIONS   Schedule regular health, dental, and eye exams.  Stay current with your immunizations.   Do not use any tobacco products including cigarettes, chewing tobacco, or electronic cigarettes.  If you are pregnant, do not drink alcohol.  If you are breastfeeding, limit how much and how often you drink alcohol.  Limit alcohol intake to no more than 1 drink per day for nonpregnant women. One drink equals 12 ounces of beer, 5 ounces of wine, or 1 ounces of hard liquor.  Do not use street drugs.  Do not share needles.  Ask your health care provider for help if you need support or information about quitting drugs.  Tell your health care provider if you often feel depressed.  Tell your health care provider if you have ever been abused or do not feel safe at home.   This information is not intended to replace advice given to you by your health care provider. Make sure you discuss any questions you have with your health care provider.   Document Released: 10/30/2010 Document Revised: 05/07/2014 Document Reviewed: 03/18/2013 Elsevier Interactive Patient Education 2016 [place] in the Home  Falls can cause injuries. They can happen to people of all ages. There are many things you can do to make your home safe and to help prevent falls.  WHAT CAN I DO ON THE OUTSIDE OF MY HOME?  Regularly fix the edges of walkways and driveways and fix any cracks.  Remove anything that might make you trip as you walk through a door, such as a raised step or threshold.  Trim any bushes or trees on the path to your home.  Use bright outdoor lighting.  Clear any walking paths of anything that might make someone trip, such as rocks or tools.  Regularly check to see if  handrails are loose or broken. Make sure that both sides of any steps have handrails.  Any raised decks and porches should have guardrails on the edges.  Have any leaves,  snow, or ice cleared regularly.  Use sand or salt on walking paths during winter.  Clean up any spills in your garage right away. This includes oil or grease spills. WHAT CAN I DO IN THE BATHROOM?   Use night lights.  Install grab bars by the toilet and in the tub and shower. Do not use towel bars as grab bars.  Use non-skid mats or decals in the tub or shower.  If you need to sit down in the shower, use a plastic, non-slip stool.  Keep the floor dry. Clean up any water that spills on the floor as soon as it happens.  Remove soap buildup in the tub or shower regularly.  Attach bath mats securely with double-sided non-slip rug tape.  Do not have throw rugs and other things on the floor that can make you trip. WHAT CAN I DO IN THE BEDROOM?  Use night lights.  Make sure that you have a light by your bed that is easy to reach.  Do not use any sheets or blankets that are too big for your bed. They should not hang down onto the floor.  Have a firm chair that has side arms. You can use this for support while you get dressed.  Do not have throw rugs and other things on the floor that can make you trip. WHAT CAN I DO IN THE KITCHEN?  Clean up any spills right away.  Avoid walking on wet floors.  Keep items that you use a lot in easy-to-reach places.  If you need to reach something above you, use a strong step stool that has a grab bar.  Keep electrical cords out of the way.  Do not use floor polish or wax that makes floors slippery. If you must use wax, use non-skid floor wax.  Do not have throw rugs and other things on the floor that can make you trip. WHAT CAN I DO WITH MY STAIRS?  Do not leave any items on the stairs.  Make sure that there are handrails on both sides of the stairs and use them. Fix  handrails that are broken or loose. Make sure that handrails are as long as the stairways.  Check any carpeting to make sure that it is firmly attached to the stairs. Fix any carpet that is loose or worn.  Avoid having throw rugs at the top or bottom of the stairs. If you do have throw rugs, attach them to the floor with carpet tape.  Make sure that you have a light switch at the top of the stairs and the bottom of the stairs. If you do not have them, ask someone to add them for you. WHAT ELSE CAN I DO TO HELP PREVENT FALLS?  Wear shoes that:  Do not have high heels.  Have rubber bottoms.  Are comfortable and fit you well.  Are closed at the toe. Do not wear sandals.  If you use a stepladder:  Make sure that it is fully opened. Do not climb a closed stepladder.  Make sure that both sides of the stepladder are locked into place.  Ask someone to hold it for you, if possible.  Clearly mark and make sure that you can see:  Any grab bars or handrails.  First and last steps.  Where the edge of each step is.  Use tools that help you move around (mobility aids) if they are needed. These include:  Canes.  Walkers.  Scooters.  Crutches.  Turn  on the lights when you go into a dark area. Replace any light bulbs as soon as they burn out.  Set up your furniture so you have a clear path. Avoid moving your furniture around.  If any of your floors are uneven, fix them.  If there are any pets around you, be aware of where they are.  Review your medicines with your doctor. Some medicines can make you feel dizzy. This can increase your chance of falling. Ask your doctor what other things that you can do to help prevent falls.   This information is not intended to replace advice given to you by your health care provider. Make sure you discuss any questions you have with your health care provider.   Document Released: 02/10/2009 Document Revised: 08/31/2014 Document Reviewed:  05/21/2014 Elsevier Interactive Patient Education 2016 [place] Maintenance, Female Adopting a healthy lifestyle and getting preventive care can go a long way to promote health and wellness. Talk with your health care provider about what schedule of regular examinations is right for you. This is a good chance for you to check in with your provider about disease prevention and staying healthy. In between checkups, there are plenty of things you can do on your own. Experts have done a lot of research about which lifestyle changes and preventive measures are most likely to keep you healthy. Ask your health care provider for more information. WEIGHT AND DIET  Eat a healthy diet  Be sure to include plenty of vegetables, fruits, low-fat dairy products, and lean protein.  Do not eat a lot of foods high in solid fats, added sugars, or salt.  Get regular exercise. This is one of the most important things you can do for your health.  Most adults should exercise for at least 150 minutes each week. The exercise should increase your heart rate and make you sweat (moderate-intensity exercise).  Most adults should also do strengthening exercises at least twice a week. This is in addition to the moderate-intensity exercise.  Maintain a healthy weight  Body mass index (BMI) is a measurement that can be used to identify possible weight problems. It estimates body fat based on height and weight. Your health care provider can help determine your BMI and help you achieve or maintain a healthy weight.  For females 18 years of age and older:   A BMI below 18.5 is considered underweight.  A BMI of 18.5 to 24.9 is normal.  A BMI of 25 to 29.9 is considered overweight.  A BMI of 30 and above is considered obese.  Watch levels of cholesterol and blood lipids  You should start having your blood tested for lipids and cholesterol at 78 years of age, then have this test every 5 years.  You may  need to have your cholesterol levels checked more often if:  Your lipid or cholesterol levels are high.  You are older than 78 years of age.  You are at high risk for heart disease.  CANCER SCREENING   Lung Cancer  Lung cancer screening is recommended for adults 60-49 years old who are at high risk for lung cancer because of a history of smoking.  A yearly low-dose CT scan of the lungs is recommended for people who:  Currently smoke.  Have quit within the past 15 years.  Have at least a 30-pack-year history of smoking. A pack year is smoking an average of one pack of cigarettes a day for 1 year.  Yearly screening should continue until it has been 15 years since you quit.  Yearly screening should stop if you develop a health problem that would prevent you from having lung cancer treatment.  Breast Cancer  Practice breast self-awareness. This means understanding how your breasts normally appear and feel.  It also means doing regular breast self-exams. Let your health care provider know about any changes, no matter how small.  If you are in your 20s or 30s, you should have a clinical breast exam (CBE) by a health care provider every 1-3 years as part of a regular health exam.  If you are 23 or older, have a CBE every year. Also consider having a breast X-ray (mammogram) every year.  If you have a family history of breast cancer, talk to your health care provider about genetic screening.  If you are at high risk for breast cancer, talk to your health care provider about having an MRI and a mammogram every year.  Breast cancer gene (BRCA) assessment is recommended for women who have family members with BRCA-related cancers. BRCA-related cancers include:  Breast.  Ovarian.  Tubal.  Peritoneal cancers.  Results of the assessment will determine the need for genetic counseling and BRCA1 and BRCA2 testing. Cervical Cancer Your health care provider may recommend that you be  screened regularly for cancer of the pelvic organs (ovaries, uterus, and vagina). This screening involves a pelvic examination, including checking for microscopic changes to the surface of your cervix (Pap test). You may be encouraged to have this screening done every 3 years, beginning at age 4.  For women ages 69-65, health care providers may recommend pelvic exams and Pap testing every 3 years, or they may recommend the Pap and pelvic exam, combined with testing for human papilloma virus (HPV), every 5 years. Some types of HPV increase your risk of cervical cancer. Testing for HPV may also be done on women of any age with unclear Pap test results.  Other health care providers may not recommend any screening for nonpregnant women who are considered low risk for pelvic cancer and who do not have symptoms. Ask your health care provider if a screening pelvic exam is right for you.  If you have had past treatment for cervical cancer or a condition that could lead to cancer, you need Pap tests and screening for cancer for at least 20 years after your treatment. If Pap tests have been discontinued, your risk factors (such as having a new sexual partner) need to be reassessed to determine if screening should resume. Some women have medical problems that increase the chance of getting cervical cancer. In these cases, your health care provider may recommend more frequent screening and Pap tests. Colorectal Cancer  This type of cancer can be detected and often prevented.  Routine colorectal cancer screening usually begins at 78 years of age and continues through 78 years of age.  Your health care provider may recommend screening at an earlier age if you have risk factors for colon cancer.  Your health care provider may also recommend using home test kits to check for hidden blood in the stool.  A small camera at the end of a tube can be used to examine your colon directly (sigmoidoscopy or colonoscopy).  This is done to check for the earliest forms of colorectal cancer.  Routine screening usually begins at age 56.  Direct examination of the colon should be repeated every 5-10 years through 78 years of age. However, you  may need to be screened more often if early forms of precancerous polyps or small growths are found. Skin Cancer  Check your skin from head to toe regularly.  Tell your health care provider about any new moles or changes in moles, especially if there is a change in a mole's shape or color.  Also tell your health care provider if you have a mole that is larger than the size of a pencil eraser.  Always use sunscreen. Apply sunscreen liberally and repeatedly throughout the day.  Protect yourself by wearing long sleeves, pants, a wide-brimmed hat, and sunglasses whenever you are outside. HEART DISEASE, DIABETES, AND HIGH BLOOD PRESSURE   High blood pressure causes heart disease and increases the risk of stroke. High blood pressure is more likely to develop in:  People who have blood pressure in the high end of the normal range (130-139/85-89 mm Hg).  People who are overweight or obese.  People who are African American.  If you are 55-73 years of age, have your blood pressure checked every 3-5 years. If you are 56 years of age or older, have your blood pressure checked every year. You should have your blood pressure measured twice--once when you are at a hospital or clinic, and once when you are not at a hospital or clinic. Record the average of the two measurements. To check your blood pressure when you are not at a hospital or clinic, you can use:  An automated blood pressure machine at a pharmacy.  A home blood pressure monitor.  If you are between 57 years and 16 years old, ask your health care provider if you should take aspirin to prevent strokes.  Have regular diabetes screenings. This involves taking a blood sample to check your fasting blood sugar level.  If you  are at a normal weight and have a low risk for diabetes, have this test once every three years after 78 years of age.  If you are overweight and have a high risk for diabetes, consider being tested at a younger age or more often. PREVENTING INFECTION  Hepatitis B  If you have a higher risk for hepatitis B, you should be screened for this virus. You are considered at high risk for hepatitis B if:  You were born in a country where hepatitis B is common. Ask your health care provider which countries are considered high risk.  Your parents were born in a high-risk country, and you have not been immunized against hepatitis B (hepatitis B vaccine).  You have HIV or AIDS.  You use needles to inject street drugs.  You live with someone who has hepatitis B.  You have had sex with someone who has hepatitis B.  You get hemodialysis treatment.  You take certain medicines for conditions, including cancer, organ transplantation, and autoimmune conditions. Hepatitis C  Blood testing is recommended for:  Everyone born from [birth year] through 1963.  Anyone with known risk factors for hepatitis C. Sexually transmitted infections (STIs)  You should be screened for sexually transmitted infections (STIs) including gonorrhea and chlamydia if:  You are sexually active and are younger than 78 years of age.  You are older than 78 years of age and your health care provider tells you that you are at risk for this type of infection.  Your sexual activity has changed since you were last screened and you are at an increased risk for chlamydia or gonorrhea. Ask your health care provider if you are at risk.  If you do not have HIV, but are at risk, it may be recommended that you take a prescription medicine daily to prevent HIV infection. This is called pre-exposure prophylaxis (PrEP). You are considered at risk if:  You are sexually active and do not regularly use condoms or know the HIV status of your  partner(s).  You take drugs by injection.  You are sexually active with a partner who has HIV. Talk with your health care provider about whether you are at high risk of being infected with HIV. If you choose to begin PrEP, you should first be tested for HIV. You should then be tested every 3 months for as long as you are taking PrEP.  PREGNANCY   If you are premenopausal and you may become pregnant, ask your health care provider about preconception counseling.  If you may become pregnant, take 400 to 800 micrograms (mcg) of folic acid every day.  If you want to prevent pregnancy, talk to your health care provider about birth control (contraception). OSTEOPOROSIS AND MENOPAUSE   Osteoporosis is a disease in which the bones lose minerals and strength with aging. This can result in serious bone fractures. Your risk for osteoporosis can be identified using a bone density scan.  If you are 43 years of age or older, or if you are at risk for osteoporosis and fractures, ask your health care provider if you should be screened.  Ask your health care provider whether you should take a calcium or vitamin D supplement to lower your risk for osteoporosis.  Menopause may have certain physical symptoms and risks.  Hormone replacement therapy may reduce some of these symptoms and risks. Talk to your health care provider about whether hormone replacement therapy is right for you.  HOME CARE INSTRUCTIONS   Schedule regular health, dental, and eye exams.  Stay current with your immunizations.   Do not use any tobacco products including cigarettes, chewing tobacco, or electronic cigarettes.  If you are pregnant, do not drink alcohol.  If you are breastfeeding, limit how much and how often you drink alcohol.  Limit alcohol intake to no more than 1 drink per day for nonpregnant women. One drink equals 12 ounces of beer, 5 ounces of wine, or 1 ounces of hard liquor.  Do not use street drugs.  Do  not share needles.  Ask your health care provider for help if you need support or information about quitting drugs.  Tell your health care provider if you often feel depressed.  Tell your health care provider if you have ever been abused or do not feel safe at home.   This information is not intended to replace advice given to you by your health care provider. Make sure you discuss any questions you have with your health care provider.   Document Released: 10/30/2010 Document Revised: 05/07/2014 Document Reviewed: 03/18/2013 Elsevier Interactive Patient Education [employer].

## 2015-06-14 NOTE — Progress Notes (Addendum)
Subjective:   Julie Montgomery is a 80 y.o. female who presents for Medicare Annual (Subsequent) preventive examination.  Review of Systems:  HRA assessment completed during visit; Julie Montgomery The Patient was informed that this wellness visit is to identify risk and educate on how to reduce risk for increase disease through lifestyle changes.   ROS deferred to CPE exam with physician on 3/15;   Medical issues  Recent colonoscopy with 10 lb wt loss and is slowly gaining weight; BMI 19; tolerating food at this time; using honey on yogurt and smoothies to add calories C diff last June 16 / found in July; traumatic illness due to weight loss Osteopenia; Hx of;  Lipids 04/2013; Cho 221; Trig 68; LDL 145; HDL 58  Glucose 125 on 09/2014;   Presents verbalizing Stressors;  Family and other; grief and loss; states some instances of unsafe driving; will seek counseling for support   States she feels overwhelmed; tries to do to much; "Time is passing fast; I can't keep up" States she can't keep things together; Memory is impacted; Having to associate to recall; Lives in  2800 square feet on one level  Expectations are high for family members; notes some anger. Let the patient ventilate;  Became teary when stating she has started losing siblings; Lost 3 overall in the past 3 to 4 years. Brother's son also recently died unexpectedly  Issues with family; has not seen dtr in 4 years;  Takes on families issues; Worries about great grands whom she raised Coping skills limited at present;   Depression screen completed; denies SI but states she doesn't feel "nice" anymore.  Also is skipping her yoga class x 2 per week and agrees to start back. 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; loves children   BMI: 19.2 Diet; oatmeal; poached egg and toast with yogurt; orange or banana Lunch; chic filet; takes fried  off; (toasted cheese sandwich)  Supper; chopped sirloin; backed sweet potatoes;  Smoothies; ice cream; gelato; 90% dark chocolate;   Exercise; does all of her house cleaning;  Was going to yoga x 2 days a week; trying to assist oldest son keep bookkeeping Keeping 2 great grand-dtr; Walked 2 miles occasionally; Agrees to start yoga again; firm decision  SAFETY/ one level home  Last summer had one fall but was rushing to get to [place] apt; shower was still slick from cleaning; no issues since  Safety reviewed for the home;   Community safety; yes Smoke detectors: yes Driving accidents and seatbelt/ no Sun protection/ not always/ Suggested Zinc on nose when in sun Medication review/ New meds  Fall assessment w one fall; no other falls Gait assessment/ no issues noted  Mobilization and Functional losses in the last year. none  Urinary or fecal incontinence reviewed/ has some urgency and will fup with GYN at next visit.   AD briefly discussed; the patient agreed to fup; is in process; Given information on HCPOA and LW   Counseling: no overdue health screens  Colonoscopy; 05/2015- aged out EKG: 04/2012 Hearing: 4000 in both ears / wax in right  Dexa 03/2006: S -0.9; Left hip -2.0/ Vit D/ deferred due to time Spent 25 min ventilating regarding family and plan; taking in calcium and Vit d; and exercise  Mammagram 08/2014 neg Ophthalmology exam; eye exam due this spring; Dr. Kathrin Penner Immunizations: none due at present  Current Care  Team reviewed and updated Dr. Fuller Plan;     Cardiac Risk Factors include: advanced age (>40men, >55 women)     Objective:     Vitals: BP 126/70 mmHg  Ht 5' 2.5" (1.588 m)  Wt 105 lb (47.628 kg)  BMI 18.89 kg/m2  Tobacco History  Smoking status  . Never Smoker   Smokeless tobacco  . Never Used     Counseling given: Not Answered   Past Medical History  Diagnosis Date  . Osteopenia   . Anxiety   . Pneumonia     hx of 03/2008    Past Surgical History  Procedure Laterality Date  . Partial hysterectomy    . Breast lumpectomy      right  . Appendectomy    . Cesarean section    . Cholecystectomy    . Rotator cuff repair  2012   Family History  Problem Relation Age of Onset  . Diabetes Sister   . Cancer Sister     lung ca  . Coronary artery disease Brother   . Dementia Brother   . Cancer Brother     lung ca  . Stomach cancer Paternal Grandmother    History  Sexual Activity  . Sexual Activity: Not on file    Outpatient Encounter Prescriptions as of 06/14/2015  Medication Sig  . acetaminophen (TYLENOL) 500 MG tablet Take 500 mg by mouth every 6 (six) hours as needed for mild pain.   . Ascorbic Acid (VITAMIN C PO) Take by mouth.  . Cholecalciferol (VITAMIN D3) 1000 UNITS CAPS Take 1 capsule by mouth daily. Take total of 2 daily  . guaiFENesin (MUCINEX) 600 MG 12 hr tablet Take by mouth 2 (two) times daily.  Marland Kitchen ALPRAZolam (XANAX) 0.25 MG tablet Take 1 tablet (0.25 mg total) by mouth 2 (two) times daily as needed for anxiety or sleep. (Patient not taking: Reported on 06/14/2015)  . saccharomyces boulardii (FLORASTOR) 250 MG capsule Take 1 capsule (250 mg total) by mouth 2 (two) times daily. (Patient not taking: Reported on 06/14/2015)   No facility-administered encounter medications on file as of 06/14/2015.    Activities of Daily Living In your present state of health, do you have any difficulty performing the following activities: 06/14/2015  Hearing? N  Vision? N  Difficulty concentrating or making decisions? N  Walking or climbing stairs? N  Dressing or bathing? N  Doing errands, shopping? N  Preparing Food and eating ? N  Using the Toilet? N  In the past six months, have you accidently leaked urine? Y  Do you have problems with loss of bowel control? N  Managing your Medications? N  Managing your Finances? N  Housekeeping or managing your Housekeeping? N    Patient Care Team: Cassandria Anger, MD as PCP - General    Assessment:    Assessment   Patient presents for yearly preventative medicine examination. Medicare questionnaire screening were completed, i.e. Functional; fall risk; depression, memory loss and hearing were unremarkable;   All immunizations and health maintenance protocols were reviewed with the patient and were up to date   Education provided for laboratory screens;  Glucose up x 1;   Medication reconciliation, past medical history, social history, problem list and allergies were reviewed in detail with the patient  Goals were established with regard to weight gain; exercise and stress  End of life planning was discussed and stated it was in process   Exercise Activities and Dietary recommendations Current Exercise Habits::  Structured exercise class, Intensity: Moderate  Goals    . patient     Will see Dr. Rexene Edison to stabilize grief and constant demands of family and self care       Fall Risk Fall Risk  06/14/2015 11/05/2014 06/23/2014  Falls in the past year? Yes Yes No  Number falls in past yr: 1 1 -  Injury with Fall? Yes Yes -  Follow up Education provided - -   Depression Screen PHQ 2/9 Scores 06/14/2015 11/05/2014 06/23/2014  PHQ - 2 Score 2 1 0  PHQ- 9 Score 7 - -    Agreed to go into counseling to process grief and loss and assist with processing family issues; Feeling anger and "not very nice" anymore.   Cognitive Testing MMSE - Mini Mental State Exam 06/14/2015  Not completed: (No Data)   Some recall but verbalizes feelings of being overwhelmed and feeling like she can't get everything done; Denies any Ad8 score issues but understands she has lost her focus and self care;  Seeking counseling   Immunization History  Administered Date(s) Administered  . Hepatitis A 06/14/2005  . Hepatitis B 06/14/2005  . Influenza Split 01/22/2011, 01/23/2012  . Influenza Whole 01/29/2008, 02/17/2008, 02/08/2009  . Influenza, High Dose  Seasonal PF 02/17/2013  . Influenza,inj,Quad PF,36+ Mos 01/20/2014, 12/30/2014  . PPD Test 05/25/2011  . Pneumococcal Conjugate-13 05/12/2013  . Pneumococcal Polysaccharide-23 02/02/2005, 05/25/2011  . Td 06/14/2005  . Zoster 08/18/2007   Screening Tests Health Maintenance  Topic Date Due  . TETANUS/TDAP  06/15/2015  . INFLUENZA VACCINE  11/29/2015  . DEXA SCAN  Completed  . ZOSTAVAX  Completed  . PNA vac Low Risk Adult  Completed      Plan:   Will seek counseling for family stress;  During the course of the visit the patient was educated and counseled about the following appropriate screening and preventive services:   Vaccines to include Pneumoccal, Influenza, Hepatitis B, Td, Zostavax, HCV/ no vaccines due  Electrocardiogram 04/2012  Cardiovascular Disease/ none noted; BP good; need lipids rechecked at [facility]   Colorectal cancer screening ; aged out  Bone density screening; osteopenic but deferred today to mental health issues; taking Vit D and Calcium in food   Diabetes screening/ Glucose up x 1;   Glaucoma screening; eye exam in the spring every year  Mammography; completed and was neg  Nutrition counseling / educated on continued weight gain; Goal is 115;   Patient Instructions (the written plan) was given to the patient.   Wynetta Fines, RN  06/14/2015    Medical screening examination/treatment/procedure(s) were performed by non-physician practitioner and as supervising physician I was immediately available for consultation/collaboration. I agree with above. Walker Kehr, MD

## 2015-07-04 ENCOUNTER — Ambulatory Visit: Payer: Self-pay | Admitting: Internal Medicine

## 2015-07-12 DIAGNOSIS — L821 Other seborrheic keratosis: Secondary | ICD-10-CM | POA: Diagnosis not present

## 2015-07-12 DIAGNOSIS — L570 Actinic keratosis: Secondary | ICD-10-CM | POA: Diagnosis not present

## 2015-07-12 DIAGNOSIS — L820 Inflamed seborrheic keratosis: Secondary | ICD-10-CM | POA: Diagnosis not present

## 2015-07-12 DIAGNOSIS — Z85828 Personal history of other malignant neoplasm of skin: Secondary | ICD-10-CM | POA: Diagnosis not present

## 2015-07-13 ENCOUNTER — Encounter: Payer: Self-pay | Admitting: Internal Medicine

## 2015-07-13 ENCOUNTER — Ambulatory Visit (INDEPENDENT_AMBULATORY_CARE_PROVIDER_SITE_OTHER): Payer: Medicare Other | Admitting: Internal Medicine

## 2015-07-13 VITALS — BP 132/70 | HR 64 | Wt 106.0 lb

## 2015-07-13 DIAGNOSIS — J069 Acute upper respiratory infection, unspecified: Secondary | ICD-10-CM

## 2015-07-13 DIAGNOSIS — F4323 Adjustment disorder with mixed anxiety and depressed mood: Secondary | ICD-10-CM

## 2015-07-13 DIAGNOSIS — J479 Bronchiectasis, uncomplicated: Secondary | ICD-10-CM

## 2015-07-13 DIAGNOSIS — R197 Diarrhea, unspecified: Secondary | ICD-10-CM | POA: Diagnosis not present

## 2015-07-13 DIAGNOSIS — R634 Abnormal weight loss: Secondary | ICD-10-CM

## 2015-07-13 NOTE — Assessment & Plan Note (Signed)
Wt Readings from Last 3 Encounters:  07/13/15 106 lb (48.081 kg)  06/14/15 105 lb (47.628 kg)  05/31/15 106 lb (48.081 kg)

## 2015-07-13 NOTE — Assessment & Plan Note (Signed)
CXR if not better

## 2015-07-13 NOTE — Progress Notes (Signed)
Subjective:  Patient ID: Julie Montgomery, female    DOB: 07/31/1935  Age: 80 y.o. MRN: [id number]  CC: No chief complaint on file.   HPI Julie Montgomery presents for a dry cough x 2 wks. F/u anxiety, bronchiectases  Outpatient Prescriptions Prior to Visit  Medication Sig Dispense Refill  . acetaminophen (TYLENOL) 500 MG tablet Take 500 mg by mouth every 6 (six) hours as needed for mild pain.     Marland Kitchen ALPRAZolam (XANAX) 0.25 MG tablet Take 1 tablet (0.25 mg total) by mouth 2 (two) times daily as needed for anxiety or sleep. 60 tablet 1  . Ascorbic Acid (VITAMIN C PO) Take by mouth.    . Cholecalciferol (VITAMIN D3) 1000 UNITS CAPS Take 1 capsule by mouth daily. Take total of 2 daily    . guaiFENesin (MUCINEX) 600 MG 12 hr tablet Take by mouth 2 (two) times daily.    Marland Kitchen saccharomyces boulardii (FLORASTOR) 250 MG capsule Take 1 capsule (250 mg total) by mouth 2 (two) times daily. (Patient not taking: Reported on 07/13/2015) 60 capsule 1   No facility-administered medications prior to visit.    ROS Review of Systems  Constitutional: Negative for chills, activity change, appetite change, fatigue and unexpected weight change.  HENT: Negative for congestion, mouth sores and sinus pressure.   Eyes: Negative for visual disturbance.  Respiratory: Positive for cough. Negative for chest tightness.   Gastrointestinal: Negative for nausea and abdominal pain.  Genitourinary: Negative for frequency, difficulty urinating and vaginal pain.  Musculoskeletal: Negative for back pain and gait problem.  Skin: Negative for pallor and rash.  Neurological: Negative for dizziness, tremors, weakness, numbness and headaches.  Psychiatric/Behavioral: Positive for sleep disturbance. Negative for suicidal ideas and confusion. The patient is nervous/anxious.     Objective:  BP 132/70 mmHg  Pulse 64  Wt 106 lb (48.081 kg)  SpO2 96%  BP Readings from Last 3 Encounters:  07/13/15 132/70  06/14/15 126/70    05/31/15 124/67    Wt Readings from Last 3 Encounters:  07/13/15 106 lb (48.081 kg)  06/14/15 105 lb (47.628 kg)  05/31/15 106 lb (48.081 kg)    Physical Exam  Constitutional: She appears well-developed. No distress.  HENT:  Head: Normocephalic.  Right Ear: External ear normal.  Left Ear: External ear normal.  Nose: Nose normal.  Mouth/Throat: Oropharynx is clear and moist.  Eyes: Conjunctivae are normal. Pupils are equal, round, and reactive to light. Right eye exhibits no discharge. Left eye exhibits no discharge.  Neck: Normal range of motion. Neck supple. No JVD present. No tracheal deviation present. No thyromegaly present.  Cardiovascular: Normal rate, regular rhythm and normal heart sounds.   Pulmonary/Chest: No stridor. No respiratory distress. She has no wheezes.  Abdominal: Soft. Bowel sounds are normal. She exhibits no distension and no mass. There is no tenderness. There is no rebound and no guarding.  Musculoskeletal: She exhibits no edema or tenderness.  Lymphadenopathy:    She has no cervical adenopathy.  Neurological: She displays normal reflexes. No cranial nerve deficit. She exhibits normal muscle tone. Coordination normal.  Skin: No rash noted. No erythema.  Psychiatric: She has a normal mood and affect. Her behavior is normal. Judgment and thought content normal.  Thin  Lab Results  Component Value Date   WBC 14.6* 09/29/2014   HGB 13.4 09/29/2014   HCT 39.6 09/29/2014   PLT 233.0 09/29/2014   GLUCOSE 125* 09/29/2014   CHOL 221* 05/12/2013   TRIG 68.0  05/12/2013   HDL 58.90 05/12/2013   LDLDIRECT 145.7 05/12/2013   LDLCALC 125* 05/07/2012   ALT 15 03/05/2014   AST 17 03/05/2014   NA 137 09/29/2014   K 3.8 09/29/2014   CL 105 09/29/2014   CREATININE 0.68 09/29/2014   BUN 22 09/29/2014   CO2 27 09/29/2014   TSH 2.76 03/05/2014    Dg Foot Complete Right  10/15/2014  CLINICAL DATA:  Swelling for 1 month.  Status post fall. EXAM: RIGHT FOOT  COMPLETE - 3+ VIEW COMPARISON:  None. FINDINGS: There is no evidence of fracture or dislocation. There is no evidence of arthropathy or other focal bone abnormality. Soft tissues are unremarkable. Incidental Achilles and plantar spurs. IMPRESSION: Negative. Electronically Signed   By: Staci Righter M.D.   On: 10/15/2014 08:31    Assessment & Plan:   Diagnoses and all orders for this visit:  URI, acute  Bronchiectasis without complication ([place])  Diarrhea, unspecified type  Situational mixed anxiety and depressive disorder  WEIGHT LOSS, ABNORMAL  I am having Julie Montgomery maintain her Vitamin D3, acetaminophen, guaiFENesin, saccharomyces boulardii, ALPRAZolam, and Ascorbic Acid (VITAMIN C PO).  No orders of the defined types were placed in this encounter.     Follow-up: No Follow-up on file.  Walker Kehr, MD

## 2015-07-13 NOTE — Assessment & Plan Note (Addendum)
S/p recent colonoscopy - reviewed Florastor prn

## 2015-07-13 NOTE — Progress Notes (Signed)
Pre visit review using our clinic review tool, if applicable. No additional management support is needed unless otherwise documented below in the visit note. 

## 2015-07-13 NOTE — Assessment & Plan Note (Signed)
Not using meds 

## 2015-07-13 NOTE — Assessment & Plan Note (Signed)
CXR if the cough is not better

## 2015-08-16 ENCOUNTER — Encounter: Payer: Self-pay | Admitting: Internal Medicine

## 2015-08-29 DIAGNOSIS — M2630 Unspecified anomaly of tooth position of fully erupted tooth or teeth: Secondary | ICD-10-CM | POA: Diagnosis not present

## 2015-08-29 DIAGNOSIS — D480 Neoplasm of uncertain behavior of bone and articular cartilage: Secondary | ICD-10-CM | POA: Diagnosis not present

## 2015-09-30 ENCOUNTER — Ambulatory Visit (INDEPENDENT_AMBULATORY_CARE_PROVIDER_SITE_OTHER): Payer: Medicare Other | Admitting: Family Medicine

## 2015-09-30 ENCOUNTER — Telehealth: Payer: Self-pay

## 2015-09-30 ENCOUNTER — Encounter: Payer: Self-pay | Admitting: Family Medicine

## 2015-09-30 VITALS — BP 120/58 | HR 65 | Temp 98.4°F | Resp 12 | Ht 62.5 in | Wt 105.0 lb

## 2015-09-30 DIAGNOSIS — R110 Nausea: Secondary | ICD-10-CM

## 2015-09-30 DIAGNOSIS — H8110 Benign paroxysmal vertigo, unspecified ear: Secondary | ICD-10-CM | POA: Diagnosis not present

## 2015-09-30 MED ORDER — ONDANSETRON HCL 4 MG PO TABS: 4.0000 mg | ORAL_TABLET | Freq: Three times a day (TID) | ORAL | Status: DC | PRN

## 2015-09-30 MED ORDER — MECLIZINE HCL 25 MG PO TABS: 25.0000 mg | ORAL_TABLET | Freq: Three times a day (TID) | ORAL | Status: DC | PRN

## 2015-09-30 NOTE — Telephone Encounter (Signed)
Called patient to see if she could come in earlier for her appt today. No answer.

## 2015-09-30 NOTE — Patient Instructions (Signed)
A few things to remember from today's visit:   1. Vertigo, benign positional, unspecified laterality  - meclizine (ANTIVERT) 25 MG tablet; Take 1 tablet (25 mg total) by mouth 3 (three) times daily as needed for dizziness.  Dispense: 30 tablet; Refill: 0 - Ambulatory referral to Physical Therapy  2. Nausea without vomiting  - ondansetron (ZOFRAN) 4 MG tablet; Take 1 tablet (4 mg total) by mouth every 8 (eight) hours as needed for nausea or vomiting.  Dispense: 15 tablet; Refill: 0   Dizziness is a perception of movement, it is sometimes difficult to describe and can be  caused by different problems, most benign but others can be life threaten.  Vertigo is the most common cause of dizziness, usually related with inner ear and can be associated with nausea, vomiting, and unbalance sensation. It can be complicated by falls due to lose of balance; so fall precautions are very important.  Most of the time dizziness is benign, usually intermittent, last a few seconds at the time and aggravated by certain positions. It usually resolves in a few weeks without residual effect but it could be recurrent.  Sometimes blood work is ordered to evaluate for other possible causes.  Dizziness can also be caused by certain medications, dehydration, migraines, and strokes.  Medication prescribed for vertigo, Meclizine, causes drowsiness/sleepiness, so frequently I recommended taking it at bedtime. I also recommend what we called vestibular exercise, referral placed. Seek immediate medical attention if: New severe headache, dobble vision, fever (100 F or more), associated numbness/tingling, focal weakness, persistent vomiting, not able to walk, or sudden worsening symptoms.   I have seen you today for an acute visit because your primary care provider was not available. Monitor for signs of worsening symptoms and seek immediate medical attention if any concerning symptom as we discussed. If symptoms are not  resolved in 1-2 weeks you should schedule a follow up appointment.  3 week follow up, sooner if needed.     If you sign-up for My chart, you can communicate easier with [place] in case you have any question or concern.

## 2015-09-30 NOTE — Progress Notes (Signed)
Pre visit review using our clinic review tool, if applicable. No additional management support is needed unless otherwise documented below in the visit note. 

## 2015-09-30 NOTE — Progress Notes (Signed)
HPI:  [name] is a 80 y.o. female, who is here today complaining of 2 of dizziness. She has Hx of vertigo, has not had an episode in a couple years.  + Spinning sensation, started after Yoga class on 09/28/15. Duration about an hour max, not at this time. Frequency daily. She has had similar prior episodes in the past. She followed with ENT about 3 years ago, vestibular therapy was done then. Exacerbated by head movement and turning in bed. Alleviated by being still.  She has tried Aspirin 81 mg, mainly for headache.   Negative for associated visual changes, chest pain,dyspnea, palpitation, or syncope. No hearing loss, tinnitus,recent URI or travel. Hx of "bad allergies": rhinorrhea, nasal congestion, and sneezing.  Dull headache frontal and parietal, mild, intermittent. She wonders if this episode could be related to stress, her son just Dx with bladder cancer. Symptoms otherwise stable.    Review of Systems  Constitutional: Negative for fever, activity change, appetite change, fatigue and unexpected weight change.  HENT: Positive for congestion, rhinorrhea and sneezing. Negative for ear discharge, ear pain, facial swelling, hearing loss, mouth sores, nosebleeds, sore throat, trouble swallowing and voice change.   Eyes: Negative for photophobia, pain and visual disturbance.  Respiratory: Negative for cough, shortness of breath and wheezing.   Cardiovascular: Negative for chest pain, palpitations and leg swelling.  Gastrointestinal: Positive for nausea. Negative for vomiting and abdominal pain.       No changes in bowel habits.  Musculoskeletal: Negative for myalgias and gait problem.  Skin: Negative for color change and rash.  Neurological: Positive for dizziness and headaches. Negative for tremors, syncope, speech difficulty, weakness and numbness.  Hematological: Negative for adenopathy. Does not bruise/bleed easily.  Psychiatric/Behavioral: Negative for  confusion and sleep disturbance. The patient is nervous/anxious.    Review of Systems (above).   Current Outpatient Prescriptions on File Prior to Visit  Medication Sig Dispense Refill  . acetaminophen (TYLENOL) 500 MG tablet Take 500 mg by mouth every 6 (six) hours as needed for mild pain.     . Ascorbic Acid (VITAMIN C PO) Take by mouth.    . Cholecalciferol (VITAMIN D3) 1000 UNITS CAPS Take 1 capsule by mouth daily. Take total of 2 daily    . guaiFENesin (MUCINEX) 600 MG 12 hr tablet Take by mouth 2 (two) times daily.     No current facility-administered medications on file prior to visit.     Past Medical History  Diagnosis Date  . Osteopenia   . Anxiety   . Pneumonia     hx of 03/2008    Social History   Social History  . Marital Status: Married    Spouse Name: N/A  . Number of Children: 4  . Years of Education: N/A   Occupational History  . Substitute Teacher    Social History Main Topics  . Smoking status: Never Smoker   . Smokeless tobacco: Never Used  . Alcohol Use: No  . Drug Use: No  . Sexual Activity: Not Asked   Other Topics Concern  . None   Social History Narrative   Married, Retired, she was looking after her GGD, now in court battle    Filed Vitals:   09/30/15 1606  BP: 120/58  Pulse: 65  Temp: 98.4 F (36.9 C)  Resp: 12   Body mass index is 18.89 kg/(m^2).  SpO2 Readings from Last 3 Encounters:  09/30/15 98%  07/13/15 96%  05/31/15 99%  Physical Exam  Constitutional: She is oriented to person, place, and time. She appears well-developed. She does not appear ill. No distress.  HENT:  Head: Atraumatic.  Right Ear: Hearing and external ear normal.  Left Ear: Hearing and external ear normal.  Mouth/Throat: Oropharynx is clear and moist and mucous membranes are normal.  Apley maneuver positive (R) Nystagmus:horizontal,mild.  Excess cerumen bilateral, R>L, left TM seen partially, R not able to see.  Eyes: Conjunctivae are  normal. Pupils are equal, round, and reactive to light.  Neck: No JVD present. No muscular tenderness present. Carotid bruit is not present. No thyroid mass and no thyromegaly present.  Cardiovascular: Normal rate and regular rhythm.   No murmur heard. Pulses:      Dorsalis pedis pulses are 2+ on the right side, and 2+ on the left side.  Respiratory: Effort normal and breath sounds normal. No respiratory distress.  GI: Soft. She exhibits no mass. There is no hepatomegaly. There is no tenderness.  Musculoskeletal: She exhibits no edema or tenderness.  Lymphadenopathy:    She has no cervical adenopathy.       Right: No supraclavicular adenopathy present.       Left: No supraclavicular adenopathy present.  Neurological: She is alert and oriented to person, place, and time. She has normal strength. No cranial nerve deficit or sensory deficit. She displays a negative Romberg sign. Coordination and gait normal.  Reflex Scores:      Bicep reflexes are 2+ on the right side and 2+ on the left side.      Patellar reflexes are 2+ on the right side and 2+ on the left side. Skin: Skin is warm. No rash noted. No erythema.  Psychiatric: She has a normal mood and affect.  Well groomed, good eye contact.      ASSESSMENT AND PLAN:    Julie Montgomery was seen today for dizziness.  Diagnoses and all orders for this visit:  Vertigo, benign positional, unspecified laterality -     meclizine (ANTIVERT) 25 MG tablet; Take 1 tablet (25 mg total) by mouth 3 (three) times daily as needed for dizziness. -     Ambulatory referral to Physical Therapy  Nausea without vomiting -     ondansetron (ZOFRAN) 4 MG tablet; Take 1 tablet (4 mg total) by mouth every 8 (eight) hours as needed for nausea or vomiting.  GLU 86. Examination today + given Hx + vertigo in the past suggest that this episode is positional vertigo. Other possible causes of dizziness discussed. Fall precautions discussed. Meclizine side effects  reviewed. Vestibular exercise, recommended, she prefers referral to PT instead doing it herself at home. Clearly instructed about warning signs. F/U in 2-3 weeks.      -Patient advised to return sooner than planned or notify a doctor immediately if symptoms worsen or persist or new concerns arise.    Hilbert Briggs G. [place], MD  [facility] [place] office.

## 2015-10-14 ENCOUNTER — Ambulatory Visit: Payer: Self-pay | Admitting: Internal Medicine

## 2015-10-18 ENCOUNTER — Ambulatory Visit (INDEPENDENT_AMBULATORY_CARE_PROVIDER_SITE_OTHER): Payer: Medicare Other | Admitting: Internal Medicine

## 2015-10-18 ENCOUNTER — Encounter: Payer: Self-pay | Admitting: Internal Medicine

## 2015-10-18 VITALS — BP 110/70 | HR 69 | Wt 103.0 lb

## 2015-10-18 DIAGNOSIS — Z0000 Encounter for general adult medical examination without abnormal findings: Secondary | ICD-10-CM

## 2015-10-18 DIAGNOSIS — F411 Generalized anxiety disorder: Secondary | ICD-10-CM | POA: Diagnosis not present

## 2015-10-18 DIAGNOSIS — Z23 Encounter for immunization: Secondary | ICD-10-CM

## 2015-10-18 DIAGNOSIS — Z638 Other specified problems related to primary support group: Secondary | ICD-10-CM | POA: Diagnosis not present

## 2015-10-18 DIAGNOSIS — R634 Abnormal weight loss: Secondary | ICD-10-CM | POA: Diagnosis not present

## 2015-10-18 DIAGNOSIS — E785 Hyperlipidemia, unspecified: Secondary | ICD-10-CM

## 2015-10-18 DIAGNOSIS — F439 Reaction to severe stress, unspecified: Secondary | ICD-10-CM | POA: Insufficient documentation

## 2015-10-18 NOTE — Addendum Note (Signed)
Addended by: Cresenciano Lick on: 10/18/2015 11:44 AM   Modules accepted: Orders

## 2015-10-18 NOTE — Progress Notes (Signed)
Subjective:  Patient ID: Julie Montgomery, female    DOB: [birth year]  Age: 80 y.o. MRN: [id number]  CC: No chief complaint on file.   HPI Julie Montgomery presents for stress, anxiety, wt loss f/u. She was thinking of getting another dog (her collie Sugar died)  Outpatient Prescriptions Prior to Visit  Medication Sig Dispense Refill  . acetaminophen (TYLENOL) 500 MG tablet Take 500 mg by mouth every 6 (six) hours as needed for mild pain.     . Ascorbic Acid (VITAMIN C PO) Take by mouth.    . Cholecalciferol (VITAMIN D3) 1000 UNITS CAPS Take 1 capsule by mouth daily. Take total of 2 daily    . guaiFENesin (MUCINEX) 600 MG 12 hr tablet Take by mouth 2 (two) times daily.    . meclizine (ANTIVERT) 25 MG tablet Take 1 tablet (25 mg total) by mouth 3 (three) times daily as needed for dizziness. (Patient not taking: Reported on 10/18/2015) 30 tablet 0  . ondansetron (ZOFRAN) 4 MG tablet Take 1 tablet (4 mg total) by mouth every 8 (eight) hours as needed for nausea or vomiting. (Patient not taking: Reported on 10/18/2015) 15 tablet 0   No facility-administered medications prior to visit.    ROS Review of Systems  Constitutional: Positive for unexpected weight change. Negative for chills, activity change, appetite change and fatigue.  HENT: Negative for congestion, mouth sores and sinus pressure.   Eyes: Negative for visual disturbance.  Respiratory: Negative for cough and chest tightness.   Gastrointestinal: Negative for nausea and abdominal pain.  Genitourinary: Negative for urgency, frequency, difficulty urinating and vaginal pain.  Musculoskeletal: Negative for back pain and gait problem.  Skin: Negative for pallor and rash.  Neurological: Negative for dizziness, tremors, weakness, numbness and headaches.  Psychiatric/Behavioral: Negative for confusion and sleep disturbance. The patient is nervous/anxious.     Objective:  BP 110/70 mmHg  Pulse 69  Wt 103 lb (46.72 kg)  SpO2 96%  BP  Readings from Last 3 Encounters:  10/18/15 110/70  09/30/15 120/58  07/13/15 132/70    Wt Readings from Last 3 Encounters:  10/18/15 103 lb (46.72 kg)  09/30/15 105 lb (47.628 kg)  07/13/15 106 lb (48.081 kg)    Physical Exam  Constitutional: She appears well-developed. No distress.  HENT:  Head: Normocephalic.  Right Ear: External ear normal.  Left Ear: External ear normal.  Nose: Nose normal.  Mouth/Throat: Oropharynx is clear and moist.  Eyes: Conjunctivae are normal. Pupils are equal, round, and reactive to light. Right eye exhibits no discharge. Left eye exhibits no discharge.  Neck: Normal range of motion. Neck supple. No JVD present. No tracheal deviation present. No thyromegaly present.  Cardiovascular: Normal rate, regular rhythm and normal heart sounds.   Pulmonary/Chest: No stridor. No respiratory distress. She has no wheezes.  Abdominal: Soft. Bowel sounds are normal. She exhibits no distension and no mass. There is no tenderness. There is no rebound and no guarding.  Musculoskeletal: She exhibits no edema or tenderness.  Lymphadenopathy:    She has no cervical adenopathy.  Neurological: She displays normal reflexes. No cranial nerve deficit. She exhibits normal muscle tone. Coordination normal.  Skin: No rash noted. No erythema.  Psychiatric: She has a normal mood and affect. Her behavior is normal. Judgment and thought content normal.    Lab Results  Component Value Date   WBC 14.6* 09/29/2014   HGB 13.4 09/29/2014   HCT 39.6 09/29/2014   PLT 233.0 09/29/2014  GLUCOSE 125* 09/29/2014   CHOL 221* 05/12/2013   TRIG 68.0 05/12/2013   HDL 58.90 05/12/2013   LDLDIRECT 145.7 05/12/2013   LDLCALC 125* 05/07/2012   ALT 15 03/05/2014   AST 17 03/05/2014   NA 137 09/29/2014   K 3.8 09/29/2014   CL 105 09/29/2014   CREATININE 0.68 09/29/2014   BUN 22 09/29/2014   CO2 27 09/29/2014   TSH 2.76 03/05/2014    Dg Foot Complete Right  10/15/2014  CLINICAL DATA:   Swelling for 1 month.  Status post fall. EXAM: RIGHT FOOT COMPLETE - 3+ VIEW COMPARISON:  None. FINDINGS: There is no evidence of fracture or dislocation. There is no evidence of arthropathy or other focal bone abnormality. Soft tissues are unremarkable. Incidental Achilles and plantar spurs. IMPRESSION: Negative. Electronically Signed   By: Staci Righter M.D.   On: 10/15/2014 08:31    Assessment & Plan:   There are no diagnoses linked to this encounter. I am having Ms. Spizzirri maintain her Vitamin D3, acetaminophen, guaiFENesin, Ascorbic Acid (VITAMIN C PO), meclizine, and ondansetron.  No orders of the defined types were placed in this encounter.     Follow-up: No Follow-up on file.  Walker Kehr, MD

## 2015-10-18 NOTE — Assessment & Plan Note (Signed)
She was thinking of getting another dog (Sugar died)

## 2015-10-18 NOTE — Assessment & Plan Note (Signed)
Chronic issue.  

## 2015-10-18 NOTE — Assessment & Plan Note (Addendum)
Discussed stress management Valerian root She was thinking of getting another dog (her collie Sugar died last summer)

## 2015-10-18 NOTE — Progress Notes (Signed)
Pre visit review using our clinic review tool, if applicable. No additional management support is needed unless otherwise documented below in the visit note. 

## 2015-10-26 DIAGNOSIS — N8110 Cystocele, unspecified: Secondary | ICD-10-CM | POA: Diagnosis not present

## 2015-10-26 DIAGNOSIS — Z681 Body mass index (BMI) 19 or less, adult: Secondary | ICD-10-CM | POA: Diagnosis not present

## 2015-10-26 DIAGNOSIS — M8580 Other specified disorders of bone density and structure, unspecified site: Secondary | ICD-10-CM | POA: Diagnosis not present

## 2015-10-26 DIAGNOSIS — R197 Diarrhea, unspecified: Secondary | ICD-10-CM | POA: Diagnosis not present

## 2015-10-26 DIAGNOSIS — Z01419 Encounter for gynecological examination (general) (routine) without abnormal findings: Secondary | ICD-10-CM | POA: Diagnosis not present

## 2015-10-27 ENCOUNTER — Other Ambulatory Visit: Payer: Self-pay | Admitting: Internal Medicine

## 2015-10-27 DIAGNOSIS — Z1231 Encounter for screening mammogram for malignant neoplasm of breast: Secondary | ICD-10-CM

## 2015-10-31 DIAGNOSIS — Z961 Presence of intraocular lens: Secondary | ICD-10-CM | POA: Diagnosis not present

## 2015-10-31 DIAGNOSIS — H26493 Other secondary cataract, bilateral: Secondary | ICD-10-CM | POA: Diagnosis not present

## 2015-10-31 DIAGNOSIS — H04123 Dry eye syndrome of bilateral lacrimal glands: Secondary | ICD-10-CM | POA: Diagnosis not present

## 2015-10-31 DIAGNOSIS — H16203 Unspecified keratoconjunctivitis, bilateral: Secondary | ICD-10-CM | POA: Diagnosis not present

## 2015-11-10 ENCOUNTER — Ambulatory Visit
Admission: RE | Admit: 2015-11-10 | Discharge: 2015-11-10 | Disposition: A | Discharge status: Home / Self Care | Payer: Medicare Other | Source: Ambulatory Visit | Attending: Internal Medicine | Admitting: Internal Medicine

## 2015-11-10 ENCOUNTER — Encounter: Payer: Self-pay | Admitting: Internal Medicine

## 2015-11-10 DIAGNOSIS — Z1231 Encounter for screening mammogram for malignant neoplasm of breast: Secondary | ICD-10-CM | POA: Diagnosis not present

## 2015-11-14 ENCOUNTER — Other Ambulatory Visit: Payer: Self-pay | Admitting: Internal Medicine

## 2015-11-14 ENCOUNTER — Encounter: Payer: Self-pay | Admitting: Internal Medicine

## 2015-11-14 DIAGNOSIS — R928 Other abnormal and inconclusive findings on diagnostic imaging of breast: Secondary | ICD-10-CM

## 2015-11-14 DIAGNOSIS — N951 Menopausal and female climacteric states: Secondary | ICD-10-CM

## 2015-11-14 DIAGNOSIS — M810 Age-related osteoporosis without current pathological fracture: Secondary | ICD-10-CM

## 2015-11-17 ENCOUNTER — Ambulatory Visit
Admission: RE | Admit: 2015-11-17 | Discharge: 2015-11-17 | Disposition: A | Discharge status: Home / Self Care | Payer: Medicare Other | Source: Ambulatory Visit | Attending: Internal Medicine | Admitting: Internal Medicine

## 2015-11-17 DIAGNOSIS — R928 Other abnormal and inconclusive findings on diagnostic imaging of breast: Secondary | ICD-10-CM

## 2015-11-18 ENCOUNTER — Other Ambulatory Visit: Payer: Self-pay

## 2015-11-22 ENCOUNTER — Ambulatory Visit: Payer: Medicare Other | Attending: Family Medicine | Admitting: Physical Therapy

## 2015-11-22 ENCOUNTER — Encounter: Payer: Self-pay | Admitting: Physical Therapy

## 2015-11-22 VITALS — BP 143/80 | HR 67

## 2015-11-22 DIAGNOSIS — H8111 Benign paroxysmal vertigo, right ear: Secondary | ICD-10-CM | POA: Diagnosis not present

## 2015-11-25 NOTE — Therapy (Signed)
[facility] [address] [place], [place], [address] Phone: [phone]   Fax:  [phone]  Physical Therapy Evaluation  Patient Details  Name: Julie Montgomery MRN: [id number] Date of Birth: [birth year] Referring Provider: Dr. Betty Martinique  Encounter Date: 11/22/2015      PT End of Session - 11/25/15 1612    Visit Number 1   Number of Visits 3   Date for PT Re-Evaluation 12/23/15   Authorization Type medicare   Authorization Time Period 11-22-15 - 01-21-16   PT Start Time 1315   PT Stop Time 1400   PT Time Calculation (min) 45 min      Past Medical History:  Diagnosis Date  . Anxiety   . Osteopenia   . Pneumonia    hx of 03/2008    Past Surgical History:  Procedure Laterality Date  . APPENDECTOMY    . BREAST LUMPECTOMY     right  . CESAREAN SECTION    . CHOLECYSTECTOMY    . PARTIAL HYSTERECTOMY    . ROTATOR CUFF REPAIR  2012    Vitals:   11/25/15 1611  BP: (!) 143/80  Pulse: 67         Subjective Assessment - 11/25/15 1605    Pertinent History Anxiety; Osteopenia   Patient Stated Goals resolve the vertigo   Currently in Pain? No/denies            [facility] PT Assessment - 11/25/15 0001      Assessment   Medical Diagnosis BPPV   Referring Provider Dr. Betty Martinique   Onset Date/Surgical Date --  May 2017     Precautions   Precautions None     Balance Screen   Has the patient fallen in the past 6 months No   Has the patient had a decrease in activity level because of a fear of falling?  No   Is the patient reluctant to leave their home because of a fear of falling?  No     Prior Function   Level of Independence Independent     Ambulation/Gait   Ambulation/Gait Yes   Ambulation/Gait Assistance 6: Modified independent (Device/Increase time)   Assistive device None            Vestibular Assessment - 11/25/15 0001      Vestibular Assessment   General Observation Pt is a  80 year old lady with c/o vertigo that started apporx. 2 months ago during a Yoga class; pt denies nausea or vomiting; pt states she is very careful to avoid movenments that may provoke the vertigo     Symptom Behavior   Type of Dizziness Spinning   Frequency of Dizziness varies   Duration of Dizziness seconds   Aggravating Factors Looking up to the ceiling;Rolling to right;Sitting with head tilted back;Turning head quickly   Relieving Factors Lying supine     Occulomotor Exam   Occulomotor Alignment Normal   Smooth Pursuits Intact   Saccades Intact     Positional Testing   Dix-Hallpike Dix-Hallpike Right;Dix-Hallpike Left   Sidelying Test Sidelying Right     Dix-Hallpike Right   Dix-Hallpike Right Duration seconds   Dix-Hallpike Right Symptoms Upbeat, right rotatory nystagmus     Dix-Hallpike Left   Dix-Hallpike Left Duration none   Dix-Hallpike Left Symptoms No nystagmus     Sidelying Right   Sidelying Right Duration seconds   Sidelying Right Symptoms Upbeat, right rotatory nystagmus  Epley maneuver x 3 reps for R BPPV - symptoms appeared to be resolved on 3rd rep               PT Education - 11/25/15 1625    Education provided Yes   Education Details bppv etiology   Person(s) Educated Patient   Methods Explanation   Comprehension Verbalized understanding             PT Long Term Goals - 11/25/15 1617      PT LONG TERM GOAL #1   Title Pt will have a (-) R Dix-Hallpike test to indicate resolution of R BPPV.  (12-23-15)   Time 4   Period Weeks   Status New     PT LONG TERM GOAL #2   Title Independent in Brandt-Daroff exercises prn for habituation of vertigo.  (12-23-15)   Time 4   Period Weeks   Status New     PT LONG TERM GOAL #3   Title Report no vertigo with bed mobility or ambulation.  (12-23-15)   Time 4   Period Weeks   Status New               Plan - 11/25/15 1615    Clinical Impression Statement Pt is a 80 yr old  lady with signs and symptoms consistent with R BPPV; symptoms resolved on 3rd rep of Epley's maneuver   Rehab Potential Good   PT Frequency 1x / week   PT Duration 3 weeks   PT Treatment/Interventions ADLs/Self Care Home Management;Canalith Repostioning;Balance training;Neuromuscular re-education;Patient/family education;Therapeutic activities;Vestibular   PT Next Visit Plan recheck R BPPV   PT Home Exercise Plan [name]- Daroff exercises prn   Consulted and Agree with Plan of Care Patient      Patient will benefit from skilled therapeutic intervention in order to improve the following deficits and impairments:  Dizziness, Decreased balance  Visit Diagnosis: BPPV (benign paroxysmal positional vertigo), right - Plan: PT plan of care cert/re-cert     Problem List Patient Active Problem List   Diagnosis Date Noted  . Stress at home 10/18/2015  . Neck pain, acute 01/26/2015  . Abrasion of arm, left 11/05/2014  . Swelling of foot joint 10/14/2014  . Clostridium difficile colitis 10/04/2014  . Diarrhea 10/04/2014  . Numbness of left hand 04/19/2014  . Dysuria 03/19/2012  . LBP (low back pain) 03/19/2012  . Lipoma of arm 01/23/2012  . Impacted cerumen of both ears 05/25/2011  . Otitis externa 05/25/2011  . URI, acute 04/05/2011  . Well adult exam 04/03/2011  . Edema 02/13/2011  . Shoulder pain, right 01/30/2011  . Bronchiectasis ([place]) 01/23/2011  . Vertigo 09/13/2010  . Allergic rhinitis 09/13/2010  . CONSTIPATION 03/22/2010  . LUMBAGO 03/22/2010  . URINARY FREQUENCY, CHRONIC 03/22/2010  . Situational mixed anxiety and depressive disorder 02/25/2008  . Cough 02/17/2008  . Generalized anxiety disorder 02/07/2007  . INSOMNIA-SLEEP DISORDER-UNSPEC 02/07/2007  . WEIGHT LOSS, ABNORMAL 02/07/2007  . HYPERCHOLESTEROLEMIA, PURE 11/21/2006  . OSTEOPENIA 11/21/2006  . ALLERGY 11/21/2006    Alda Lea, PT 11/25/2015, 4:27 PM  [facility] [address] [place], [place], [address] Phone: [phone]   Fax:  [phone]  Name: Julie Montgomery MRN: [id number] Date of Birth: [birth year]

## 2015-11-25 NOTE — Patient Instructions (Signed)
Benign Positional Vertigo Vertigo is the feeling that you or your surroundings are moving when they are not. Benign positional vertigo is the most common form of vertigo. The cause of this condition is not serious (is benign). This condition is triggered by certain movements and positions (is positional). This condition can be dangerous if it occurs while you are doing something that could endanger you or others, such as driving.  CAUSES In many cases, the cause of this condition is not known. It may be caused by a disturbance in an area of the inner ear that helps your brain to sense movement and balance. This disturbance can be caused by a viral infection (labyrinthitis), head injury, or repetitive motion. RISK FACTORS This condition is more likely to develop in:  Women.  People who are 50 years of age or older. SYMPTOMS Symptoms of this condition usually happen when you move your head or your eyes in different directions. Symptoms may start suddenly, and they usually last for less than a minute. Symptoms may include:  Loss of balance and falling.  Feeling like you are spinning or moving.  Feeling like your surroundings are spinning or moving.  Nausea and vomiting.  Blurred vision.  Dizziness.  Involuntary eye movement (nystagmus). Symptoms can be mild and cause only slight annoyance, or they can be severe and interfere with daily life. Episodes of benign positional vertigo may return (recur) over time, and they may be triggered by certain movements. Symptoms may improve over time. DIAGNOSIS This condition is usually diagnosed by medical history and a physical exam of the head, neck, and ears. You may be referred to a health care provider who specializes in ear, nose, and throat (ENT) problems (otolaryngologist) or a provider who specializes in disorders of the nervous system (neurologist). You may have additional testing, including:  MRI.  A CT scan.  Eye movement tests. Your  health care provider may ask you to change positions quickly while he or she watches you for symptoms of benign positional vertigo, such as nystagmus. Eye movement may be tested with an electronystagmogram (ENG), caloric stimulation, the Dix-Hallpike test, or the roll test.  An electroencephalogram (EEG). This records electrical activity in your brain.  Hearing tests. TREATMENT Usually, your health care provider will treat this by moving your head in specific positions to adjust your inner ear back to normal. Surgery may be needed in severe cases, but this is rare. In some cases, benign positional vertigo may resolve on its own in 2-4 weeks. HOME CARE INSTRUCTIONS Safety  Move slowly.Avoid sudden body or head movements.  Avoid driving.  Avoid operating heavy machinery.  Avoid doing any tasks that would be dangerous to you or others if a vertigo episode would occur.  If you have trouble walking or keeping your balance, try using a cane for stability. If you feel dizzy or unstable, sit down right away.  Return to your normal activities as told by your health care provider. Ask your health care provider what activities are safe for you. General Instructions  Take over-the-counter and prescription medicines only as told by your health care provider.  Avoid certain positions or movements as told by your health care provider.  Drink enough fluid to keep your urine clear or pale yellow.  Keep all follow-up visits as told by your health care provider. This is important. SEEK MEDICAL CARE IF:  You have a fever.  Your condition gets worse or you develop new symptoms.  Your family or friends   notice any behavioral changes.  Your nausea or vomiting gets worse.  You have numbness or a "pins and needles" sensation. SEEK IMMEDIATE MEDICAL CARE IF:  You have difficulty speaking or moving.  You are always dizzy.  You faint.  You develop severe headaches.  You have weakness in your  legs or arms.  You have changes in your hearing or vision.  You develop a stiff neck.  You develop sensitivity to light.   This information is not intended to replace advice given to you by your health care provider. Make sure you discuss any questions you have with your health care provider.   Document Released: 01/22/2006 Document Revised: 01/05/2015 Document Reviewed: 08/09/2014 Elsevier Interactive Patient Education 2016 [employer]  

## 2015-12-05 ENCOUNTER — Ambulatory Visit (INDEPENDENT_AMBULATORY_CARE_PROVIDER_SITE_OTHER): Payer: Medicare Other | Admitting: Psychology

## 2015-12-05 DIAGNOSIS — F4323 Adjustment disorder with mixed anxiety and depressed mood: Secondary | ICD-10-CM | POA: Diagnosis not present

## 2015-12-13 ENCOUNTER — Ambulatory Visit: Payer: Medicare Other | Attending: Family Medicine | Admitting: Physical Therapy

## 2015-12-13 ENCOUNTER — Other Ambulatory Visit (INDEPENDENT_AMBULATORY_CARE_PROVIDER_SITE_OTHER): Payer: Medicare Other

## 2015-12-13 ENCOUNTER — Ambulatory Visit (INDEPENDENT_AMBULATORY_CARE_PROVIDER_SITE_OTHER): Payer: Medicare Other | Admitting: Internal Medicine

## 2015-12-13 ENCOUNTER — Encounter: Payer: Self-pay | Admitting: Internal Medicine

## 2015-12-13 DIAGNOSIS — Z0000 Encounter for general adult medical examination without abnormal findings: Secondary | ICD-10-CM | POA: Diagnosis not present

## 2015-12-13 DIAGNOSIS — H8111 Benign paroxysmal vertigo, right ear: Secondary | ICD-10-CM | POA: Diagnosis not present

## 2015-12-13 DIAGNOSIS — F411 Generalized anxiety disorder: Secondary | ICD-10-CM | POA: Diagnosis not present

## 2015-12-13 DIAGNOSIS — F439 Reaction to severe stress, unspecified: Secondary | ICD-10-CM

## 2015-12-13 DIAGNOSIS — R634 Abnormal weight loss: Secondary | ICD-10-CM

## 2015-12-13 DIAGNOSIS — E785 Hyperlipidemia, unspecified: Secondary | ICD-10-CM

## 2015-12-13 DIAGNOSIS — Z638 Other specified problems related to primary support group: Secondary | ICD-10-CM

## 2015-12-13 LAB — CBC WITH DIFFERENTIAL/PLATELET
Basophils Absolute: 0 10*3/uL (ref 0.0–0.1)
Basophils Relative: 0.5 % (ref 0.0–3.0)
Eosinophils Absolute: 0.1 10*3/uL (ref 0.0–0.7)
Eosinophils Relative: 2.1 % (ref 0.0–5.0)
HCT: 39.4 % (ref 36.0–46.0)
Hemoglobin: 13.5 g/dL (ref 12.0–15.0)
Lymphocytes Relative: 25.9 % (ref 12.0–46.0)
Lymphs Abs: 1.7 10*3/uL (ref 0.7–4.0)
MCHC: 34.3 g/dL (ref 30.0–36.0)
MCV: 93.6 fl (ref 78.0–100.0)
Monocytes Absolute: 0.5 10*3/uL (ref 0.1–1.0)
Monocytes Relative: 7.1 % (ref 3.0–12.0)
Neutro Abs: 4.2 10*3/uL (ref 1.4–7.7)
Neutrophils Relative %: 64.4 % (ref 43.0–77.0)
Platelets: 241 10*3/uL (ref 150.0–400.0)
RBC: 4.21 Mil/uL (ref 3.87–5.11)
RDW: 12.9 % (ref 11.5–15.5)
WBC: 6.5 10*3/uL (ref 4.0–10.5)

## 2015-12-13 LAB — URINALYSIS, ROUTINE W REFLEX MICROSCOPIC
Bilirubin Urine: NEGATIVE
Hgb urine dipstick: NEGATIVE
KETONES UR: NEGATIVE
NITRITE: NEGATIVE
PH: 6 (ref 5.0–8.0)
SPECIFIC GRAVITY, URINE: 1.025 (ref 1.000–1.030)
Total Protein, Urine: NEGATIVE
URINE GLUCOSE: NEGATIVE
Urobilinogen, UA: 0.2 (ref 0.0–1.0)

## 2015-12-13 LAB — BASIC METABOLIC PANEL
BUN: 18 mg/dL (ref 6–23)
CHLORIDE: 106 meq/L (ref 96–112)
CO2: 28 mEq/L (ref 19–32)
Calcium: 9.7 mg/dL (ref 8.4–10.5)
Creatinine, Ser: 0.69 mg/dL (ref 0.40–1.20)
GFR: 87.51 mL/min (ref >60.00)
Glucose, Bld: 90 mg/dL (ref 70–99)
POTASSIUM: 4.4 meq/L (ref 3.5–5.1)
SODIUM: 142 meq/L (ref 135–145)

## 2015-12-13 LAB — HEPATIC FUNCTION PANEL
ALT: 11 U/L (ref 0–35)
AST: 15 U/L (ref 0–37)
Albumin: 4.2 g/dL (ref 3.5–5.2)
Alkaline Phosphatase: 77 U/L (ref 39–117)
Bilirubin, Direct: 0.1 mg/dL (ref 0.0–0.3)
Total Bilirubin: 0.5 mg/dL (ref 0.2–1.2)
Total Protein: 6.7 g/dL (ref 6.0–8.3)

## 2015-12-13 LAB — LIPID PANEL
Cholesterol: 215 mg/dL — ABNORMAL HIGH (ref 0–200)
HDL: 63.5 mg/dL (ref >39.00)
LDL Cholesterol: 134 mg/dL — ABNORMAL HIGH (ref 0–99)
NonHDL: 151.17
Total CHOL/HDL Ratio: 3
Triglycerides: 84 mg/dL (ref 0.0–149.0)
VLDL: 16.8 mg/dL (ref 0.0–40.0)

## 2015-12-13 LAB — TSH: TSH: 3.64 u[IU]/mL (ref 0.35–4.50)

## 2015-12-13 MED ORDER — ESCITALOPRAM OXALATE 5 MG PO TABS: 5.0000 mg | ORAL_TABLET | Freq: Every day | ORAL | 5 refills | Status: DC

## 2015-12-13 NOTE — Progress Notes (Signed)
Pre visit review using our clinic review tool, if applicable. No additional management support is needed unless otherwise documented below in the visit note. 

## 2015-12-13 NOTE — Assessment & Plan Note (Signed)
Start lexapro - low dose Try Valerian root for the anxiety

## 2015-12-13 NOTE — Assessment & Plan Note (Signed)
Here for medicare wellness/physical  Diet: heart healthy  Physical activity: not sedentary  Depression/mood screen: depressed Hearing: intact to whispered voice  Visual acuity: grossly normal, performs annual eye exam  ADLs: capable  Fall risk: low to none  Home safety: good  Cognitive evaluation: intact to orientation, naming, recall and repetition  EOL planning: adv directives, full code/ I agree  I have personally reviewed and have noted  1. The patient's medical, surgical and social history  2. Their use of alcohol, tobacco or illicit drugs  3. Their current medications and supplements  4. The patient's functional ability including ADL's, fall risks, home safety risks and hearing or visual impairment.  5. Diet and physical activities  6. Evidence for depression or mood disorders 7. The roster of all physicians providing medical care to patient - is listed in the Snapshot section of the chart and reviewed today.    Today patient counseled on age appropriate routine health concerns for screening and prevention, each reviewed and up to date or declined. Immunizations reviewed and up to date or declined. Labs ordered and reviewed. Risk factors for depression reviewed and negative. Hearing function and visual acuity are intact. ADLs screened and addressed as needed. Functional ability and level of safety reviewed and appropriate. Education, counseling and referrals performed based on assessed risks today. Patient provided with a copy of personalized plan for preventive services.     

## 2015-12-13 NOTE — Progress Notes (Signed)
Subjective:  Patient ID: Julie Montgomery, female    DOB: April 14, 1936  Age: 80 y.o. MRN: [id number]  CC: No chief complaint on file.   HPI SAKARI HAVRILLA presents for a well exam. C/o worsening anxiety  Outpatient Medications Prior to Visit  Medication Sig Dispense Refill  . acetaminophen (TYLENOL) 500 MG tablet Take 500 mg by mouth every 6 (six) hours as needed for mild pain.     . Ascorbic Acid (VITAMIN C PO) Take by mouth.    . Cholecalciferol (VITAMIN D3) 1000 UNITS CAPS Take 1 capsule by mouth daily. Take total of 2 daily    . guaiFENesin (MUCINEX) 600 MG 12 hr tablet Take by mouth 2 (two) times daily.    . meclizine (ANTIVERT) 25 MG tablet Take 1 tablet (25 mg total) by mouth 3 (three) times daily as needed for dizziness. 30 tablet 0  . ondansetron (ZOFRAN) 4 MG tablet Take 1 tablet (4 mg total) by mouth every 8 (eight) hours as needed for nausea or vomiting. 15 tablet 0   No facility-administered medications prior to visit.     ROS Review of Systems  Constitutional: Positive for unexpected weight change. Negative for activity change, appetite change, chills and fatigue.  HENT: Negative for congestion, mouth sores and sinus pressure.   Eyes: Negative for visual disturbance.  Respiratory: Negative for cough and chest tightness.   Gastrointestinal: Negative for abdominal pain and nausea.  Genitourinary: Negative for difficulty urinating, frequency and vaginal pain.  Musculoskeletal: Negative for back pain and gait problem.  Skin: Negative for pallor and rash.  Neurological: Negative for dizziness, tremors, weakness, numbness and headaches.  Psychiatric/Behavioral: Positive for dysphoric mood and sleep disturbance. Negative for confusion. The patient is nervous/anxious.     Objective:  BP 128/70   Pulse 67   Ht 5' 1.5" (1.562 m)   Wt 103 lb (46.7 kg)   SpO2 94%   BMI 19.15 kg/m   BP Readings from Last 3 Encounters:  12/13/15 128/70  11/25/15 (!) 143/80  10/18/15  110/70    Wt Readings from Last 3 Encounters:  12/13/15 103 lb (46.7 kg)  10/18/15 103 lb (46.7 kg)  09/30/15 105 lb (47.6 kg)    Physical Exam  Constitutional: She appears well-developed. No distress.  HENT:  Head: Normocephalic.  Right Ear: External ear normal.  Left Ear: External ear normal.  Nose: Nose normal.  Mouth/Throat: Oropharynx is clear and moist.  Eyes: Conjunctivae are normal. Pupils are equal, round, and reactive to light. Right eye exhibits no discharge. Left eye exhibits no discharge.  Neck: Normal range of motion. Neck supple. No JVD present. No tracheal deviation present. No thyromegaly present.  Cardiovascular: Normal rate, regular rhythm and normal heart sounds.   Pulmonary/Chest: No stridor. No respiratory distress. She has no wheezes.  Abdominal: Soft. Bowel sounds are normal. She exhibits no distension and no mass. There is no tenderness. There is no rebound and no guarding.  Musculoskeletal: She exhibits no edema or tenderness.  Lymphadenopathy:    She has no cervical adenopathy.  Neurological: She displays normal reflexes. No cranial nerve deficit. She exhibits normal muscle tone. Coordination normal.  Skin: No rash noted. No erythema.  Psychiatric: Her behavior is normal. Judgment and thought content normal.  anxious Thin  Lab Results  Component Value Date   WBC 14.6 (H) 09/29/2014   HGB 13.4 09/29/2014   HCT 39.6 09/29/2014   PLT 233.0 09/29/2014   GLUCOSE 125 (H) 09/29/2014  CHOL 221 (H) 05/12/2013   TRIG 68.0 05/12/2013   HDL 58.90 05/12/2013   LDLDIRECT 145.7 05/12/2013   LDLCALC 125 (H) 05/07/2012   ALT 15 03/05/2014   AST 17 03/05/2014   NA 137 09/29/2014   K 3.8 09/29/2014   CL 105 09/29/2014   CREATININE 0.68 09/29/2014   BUN 22 09/29/2014   CO2 27 09/29/2014   TSH 2.76 03/05/2014    Mm Diag Breast Tomo Uni Left  Result Date: 11/17/2015 CLINICAL DATA:  Patient returns today to evaluate a possible left breast asymmetry  questioned on recent screening mammogram. EXAM: 2D DIGITAL DIAGNOSTIC UNILATERAL LEFT MAMMOGRAM WITH CAD AND ADJUNCT TOMO COMPARISON:  Previous exams including recent screening mammogram dated 11/10/2015. ACR Breast Density Category b: There are scattered areas of fibroglandular density. FINDINGS: On today's additional views with spot compression and 3D tomosynthesis, there is no persistent asymmetry within the upper left breast indicating superimposition of normal fibroglandular tissues. Biopsy clip within the upper outer quadrant is stable in position. There are no new dominant masses, suspicious calcifications or secondary signs of malignancy identified on today's exam. Mammographic images were processed with CAD. IMPRESSION: No evidence of malignancy. Patient may return to routine annual bilateral screening mammogram schedule. RECOMMENDATION: Screening mammogram in one year.(Code:[id number]-B-01Y) I have discussed the findings and recommendations with the patient. Results were also provided in writing at the conclusion of the visit. If applicable, a reminder letter will be sent to the patient regarding the next appointment. BI-RADS CATEGORY  2: Benign. Electronically Signed   By: Franki Cabot M.D.   On: 11/17/2015 14:35    Assessment & Plan:   There are no diagnoses linked to this encounter. I am having Ms. Briden maintain her Vitamin D3, acetaminophen, guaiFENesin, Ascorbic Acid (VITAMIN C PO), meclizine, and ondansetron.  No orders of the defined types were placed in this encounter.    Follow-up: No Follow-up on file.  Walker Kehr, MD

## 2015-12-13 NOTE — Patient Instructions (Addendum)
Try Valerian root for the anxiety  Preventive Care for Adults, Female A healthy lifestyle and preventive care can promote health and wellness. Preventive health guidelines for women include the following key practices.  A routine yearly physical is a good way to check with your health care provider about your health and preventive screening. It is a chance to share any concerns and updates on your health and to receive a thorough exam.  Visit your dentist for a routine exam and preventive care every 6 months. Brush your teeth twice a day and floss once a day. Good oral hygiene prevents tooth decay and gum disease.  The frequency of eye exams is based on your age, health, family medical history, use of contact lenses, and other factors. Follow your health care provider's recommendations for frequency of eye exams.  Eat a healthy diet. Foods like vegetables, fruits, whole grains, low-fat dairy products, and lean protein foods contain the nutrients you need without too many calories. Decrease your intake of foods high in solid fats, added sugars, and salt. Eat the right amount of calories for you.Get information about a proper diet from your health care provider, if necessary.  Regular physical exercise is one of the most important things you can do for your health. Most adults should get at least 150 minutes of moderate-intensity exercise (any activity that increases your heart rate and causes you to sweat) each week. In addition, most adults need muscle-strengthening exercises on 2 or more days a week.  Maintain a healthy weight. The body mass index (BMI) is a screening tool to identify possible weight problems. It provides an estimate of body fat based on height and weight. Your health care provider can find your BMI and can help you achieve or maintain a healthy weight.For adults 20 years and older:  A BMI below 18.5 is considered underweight.  A BMI of 18.5 to 24.9 is normal.  A BMI of 25 to  29.9 is considered overweight.  A BMI of 30 and above is considered obese.  Maintain normal blood lipids and cholesterol levels by exercising and minimizing your intake of saturated fat. Eat a balanced diet with plenty of fruit and vegetables. Blood tests for lipids and cholesterol should begin at age 48 and be repeated every 5 years. If your lipid or cholesterol levels are high, you are over 50, or you are at high risk for heart disease, you may need your cholesterol levels checked more frequently.Ongoing high lipid and cholesterol levels should be treated with medicines if diet and exercise are not working.  If you smoke, find out from your health care provider how to quit. If you do not use tobacco, do not start.  Lung cancer screening is recommended for adults aged 90-80 years who are at high risk for developing lung cancer because of a history of smoking. A yearly low-dose CT scan of the lungs is recommended for people who have at least a 30-pack-year history of smoking and are a current smoker or have quit within the past 15 years. A pack year of smoking is smoking an average of 1 pack of cigarettes a day for 1 year (for example: 1 pack a day for 30 years or 2 packs a day for 15 years). Yearly screening should continue until the smoker has stopped smoking for at least 15 years. Yearly screening should be stopped for people who develop a health problem that would prevent them from having lung cancer treatment.  If you are pregnant,  do not drink alcohol. If you are breastfeeding, be very cautious about drinking alcohol. If you are not pregnant and choose to drink alcohol, do not have more than 1 drink per day. One drink is considered to be 12 ounces (355 mL) of beer, 5 ounces (148 mL) of wine, or 1.5 ounces (44 mL) of liquor.  Avoid use of street drugs. Do not share needles with anyone. Ask for help if you need support or instructions about stopping the use of drugs.  High blood pressure causes  heart disease and increases the risk of stroke. Your blood pressure should be checked at least every 1 to 2 years. Ongoing high blood pressure should be treated with medicines if weight loss and exercise do not work.  If you are 55-79 years old, ask your health care provider if you should take aspirin to prevent strokes.  Diabetes screening is done by taking a blood sample to check your blood glucose level after you have not eaten for a certain period of time (fasting). If you are not overweight and you do not have risk factors for diabetes, you should be screened once every 3 years starting at age 45. If you are overweight or obese and you are 40-70 years of age, you should be screened for diabetes every year as part of your cardiovascular risk assessment.  Breast cancer screening is essential preventive care for women. You should practice "breast self-awareness." This means understanding the normal appearance and feel of your breasts and may include breast self-examination. Any changes detected, no matter how small, should be reported to a health care provider. Women in their 20s and 30s should have a clinical breast exam (CBE) by a health care provider as part of a regular health exam every 1 to 3 years. After age 40, women should have a CBE every year. Starting at age 40, women should consider having a mammogram (breast X-ray test) every year. Women who have a family history of breast cancer should talk to their health care provider about genetic screening. Women at a high risk of breast cancer should talk to their health care providers about having an MRI and a mammogram every year.  Breast cancer gene (BRCA)-related cancer risk assessment is recommended for women who have family members with BRCA-related cancers. BRCA-related cancers include breast, ovarian, tubal, and peritoneal cancers. Having family members with these cancers may be associated with an increased risk for harmful changes (mutations)  in the breast cancer genes BRCA1 and BRCA2. Results of the assessment will determine the need for genetic counseling and BRCA1 and BRCA2 testing.  Your health care provider may recommend that you be screened regularly for cancer of the pelvic organs (ovaries, uterus, and vagina). This screening involves a pelvic examination, including checking for microscopic changes to the surface of your cervix (Pap test). You may be encouraged to have this screening done every 3 years, beginning at age 21.  For women ages 30-65, health care providers may recommend pelvic exams and Pap testing every 3 years, or they may recommend the Pap and pelvic exam, combined with testing for human papilloma virus (HPV), every 5 years. Some types of HPV increase your risk of cervical cancer. Testing for HPV may also be done on women of any age with unclear Pap test results.  Other health care providers may not recommend any screening for nonpregnant women who are considered low risk for pelvic cancer and who do not have symptoms. Ask your health care provider if   a screening pelvic exam is right for you.  If you have had past treatment for cervical cancer or a condition that could lead to cancer, you need Pap tests and screening for cancer for at least 20 years after your treatment. If Pap tests have been discontinued, your risk factors (such as having a new sexual partner) need to be reassessed to determine if screening should resume. Some women have medical problems that increase the chance of getting cervical cancer. In these cases, your health care provider may recommend more frequent screening and Pap tests.  Colorectal cancer can be detected and often prevented. Most routine colorectal cancer screening begins at the age of 29 years and continues through age 16 years. However, your health care provider may recommend screening at an earlier age if you have risk factors for colon cancer. On a yearly basis, your health care provider  may provide home test kits to check for hidden blood in the stool. Use of a small camera at the end of a tube, to directly examine the colon (sigmoidoscopy or colonoscopy), can detect the earliest forms of colorectal cancer. Talk to your health care provider about this at age 48, when routine screening begins. Direct exam of the colon should be repeated every 5-10 years through age 23 years, unless early forms of precancerous polyps or small growths are found.  People who are at an increased risk for hepatitis B should be screened for this virus. You are considered at high risk for hepatitis B if:  You were born in a country where hepatitis B occurs often. Talk with your health care provider about which countries are considered high risk.  Your parents were born in a high-risk country and you have not received a shot to protect against hepatitis B (hepatitis B vaccine).  You have HIV or AIDS.  You use needles to inject street drugs.  You live with, or have sex with, someone who has hepatitis B.  You get hemodialysis treatment.  You take certain medicines for conditions like cancer, organ transplantation, and autoimmune conditions.  Hepatitis C blood testing is recommended for all people born from [birth year] through 1965 and any individual with known risks for hepatitis C.  Practice safe sex. Use condoms and avoid high-risk sexual practices to reduce the spread of sexually transmitted infections (STIs). STIs include gonorrhea, chlamydia, syphilis, trichomonas, herpes, HPV, and human immunodeficiency virus (HIV). Herpes, HIV, and HPV are viral illnesses that have no cure. They can result in disability, cancer, and death.  You should be screened for sexually transmitted illnesses (STIs) including gonorrhea and chlamydia if:  You are sexually active and are younger than 24 years.  You are older than 24 years and your health care provider tells you that you are at risk for this type of  infection.  Your sexual activity has changed since you were last screened and you are at an increased risk for chlamydia or gonorrhea. Ask your health care provider if you are at risk.  If you are at risk of being infected with HIV, it is recommended that you take a prescription medicine daily to prevent HIV infection. This is called preexposure prophylaxis (PrEP). You are considered at risk if:  You are sexually active and do not regularly use condoms or know the HIV status of your partner(s).  You take drugs by injection.  You are sexually active with a partner who has HIV.  Talk with your health care provider about whether you are at high  risk of being infected with HIV. If you choose to begin PrEP, you should first be tested for HIV. You should then be tested every 3 months for as long as you are taking PrEP.  Osteoporosis is a disease in which the bones lose minerals and strength with aging. This can result in serious bone fractures or breaks. The risk of osteoporosis can be identified using a bone density scan. Women ages 65 years and over and women at risk for fractures or osteoporosis should discuss screening with their health care providers. Ask your health care provider whether you should take a calcium supplement or vitamin D to reduce the rate of osteoporosis.  Menopause can be associated with physical symptoms and risks. Hormone replacement therapy is available to decrease symptoms and risks. You should talk to your health care provider about whether hormone replacement therapy is right for you.  Use sunscreen. Apply sunscreen liberally and repeatedly throughout the day. You should seek shade when your shadow is shorter than you. Protect yourself by wearing long sleeves, pants, a wide-brimmed hat, and sunglasses year round, whenever you are outdoors.  Once a month, do a whole body skin exam, using a mirror to look at the skin on your back. Tell your health care provider of new moles,  moles that have irregular borders, moles that are larger than a pencil eraser, or moles that have changed in shape or color.  Stay current with required vaccines (immunizations).  Influenza vaccine. All adults should be immunized every year.  Tetanus, diphtheria, and acellular pertussis (Td, Tdap) vaccine. Pregnant women should receive 1 dose of Tdap vaccine during each pregnancy. The dose should be obtained regardless of the length of time since the last dose. Immunization is preferred during the 27th-36th week of gestation. An adult who has not previously received Tdap or who does not know her vaccine status should receive 1 dose of Tdap. This initial dose should be followed by tetanus and diphtheria toxoids (Td) booster doses every 10 years. Adults with an unknown or incomplete history of completing a 3-dose immunization series with Td-containing vaccines should begin or complete a primary immunization series including a Tdap dose. Adults should receive a Td booster every 10 years.  Varicella vaccine. An adult without evidence of immunity to varicella should receive 2 doses or a second dose if she has previously received 1 dose. Pregnant females who do not have evidence of immunity should receive the first dose after pregnancy. This first dose should be obtained before leaving the health care facility. The second dose should be obtained 4-8 weeks after the first dose.  Human papillomavirus (HPV) vaccine. Females aged 13-26 years who have not received the vaccine previously should obtain the 3-dose series. The vaccine is not recommended for use in pregnant females. However, pregnancy testing is not needed before receiving a dose. If a female is found to be pregnant after receiving a dose, no treatment is needed. In that case, the remaining doses should be delayed until after the pregnancy. Immunization is recommended for any person with an immunocompromised condition through the age of 26 years if she  did not get any or all doses earlier. During the 3-dose series, the second dose should be obtained 4-8 weeks after the first dose. The third dose should be obtained 24 weeks after the first dose and 16 weeks after the second dose.  Zoster vaccine. One dose is recommended for adults aged 60 years or older unless certain conditions are present.  Measles,   mumps, and rubella (MMR) vaccine. Adults born before [birth year] generally are considered immune to measles and mumps. Adults born in [birth year] or later should have 1 or more doses of MMR vaccine unless there is a contraindication to the vaccine or there is laboratory evidence of immunity to each of the three diseases. A routine second dose of MMR vaccine should be obtained at least 28 days after the first dose for students attending postsecondary schools, health care workers, or international travelers. People who received inactivated measles vaccine or an unknown type of measles vaccine during 1963-1967 should receive 2 doses of MMR vaccine. People who received inactivated mumps vaccine or an unknown type of mumps vaccine before 1979 and are at high risk for mumps infection should consider immunization with 2 doses of MMR vaccine. For females of childbearing age, rubella immunity should be determined. If there is no evidence of immunity, females who are not pregnant should be vaccinated. If there is no evidence of immunity, females who are pregnant should delay immunization until after pregnancy. Unvaccinated health care workers born before 50 who lack laboratory evidence of measles, mumps, or rubella immunity or laboratory confirmation of disease should consider measles and mumps immunization with 2 doses of MMR vaccine or rubella immunization with 1 dose of MMR vaccine.  Pneumococcal 13-valent conjugate (PCV13) vaccine. When indicated, a person who is uncertain of his immunization history and has no record of immunization should receive the PCV13 vaccine. All adults  50 years of age and older should receive this vaccine. An adult aged 35 years or older who has certain medical conditions and has not been previously immunized should receive 1 dose of PCV13 vaccine. This PCV13 should be followed with a dose of pneumococcal polysaccharide (PPSV23) vaccine. Adults who are at high risk for pneumococcal disease should obtain the PPSV23 vaccine at least 8 weeks after the dose of PCV13 vaccine. Adults older than 78 years of age who have normal immune system function should obtain the PPSV23 vaccine dose at least 1 year after the dose of PCV13 vaccine.  Pneumococcal polysaccharide (PPSV23) vaccine. When PCV13 is also indicated, PCV13 should be obtained first. All adults aged 83 years and older should be immunized. An adult younger than age 70 years who has certain medical conditions should be immunized. Any person who resides in a nursing home or long-term care facility should be immunized. An adult smoker should be immunized. People with an immunocompromised condition and certain other conditions should receive both PCV13 and PPSV23 vaccines. People with human immunodeficiency virus (HIV) infection should be immunized as soon as possible after diagnosis. Immunization during chemotherapy or radiation therapy should be avoided. Routine use of PPSV23 vaccine is not recommended for American Indians, Golden Natives, or people younger than 65 years unless there are medical conditions that require PPSV23 vaccine. When indicated, people who have unknown immunization and have no record of immunization should receive PPSV23 vaccine. One-time revaccination 5 years after the first dose of PPSV23 is recommended for people aged 19-64 years who have chronic kidney failure, nephrotic syndrome, asplenia, or immunocompromised conditions. People who received 1-2 doses of PPSV23 before age 76 years should receive another dose of PPSV23 vaccine at age 58 years or later if at least 5 years have passed since  the previous dose. Doses of PPSV23 are not needed for people immunized with PPSV23 at or after age 43 years.  Meningococcal vaccine. Adults with asplenia or persistent complement component deficiencies should receive 2 doses of quadrivalent meningococcal conjugate (  MenACWY-D) vaccine. The doses should be obtained at least 2 months apart. Microbiologists working with certain meningococcal bacteria, military recruits, people at risk during an outbreak, and people who travel to or live in countries with a high rate of meningitis should be immunized. A first-year college student up through age 21 years who is living in a residence hall should receive a dose if she did not receive a dose on or after her 16th birthday. Adults who have certain high-risk conditions should receive one or more doses of vaccine.  Hepatitis A vaccine. Adults who wish to be protected from this disease, have certain high-risk conditions, work with hepatitis A-infected animals, work in hepatitis A research labs, or travel to or work in countries with a high rate of hepatitis A should be immunized. Adults who were previously unvaccinated and who anticipate close contact with an international adoptee during the first 60 days after arrival in the [place] from a country with a high rate of hepatitis A should be immunized.  Hepatitis B vaccine. Adults who wish to be protected from this disease, have certain high-risk conditions, may be exposed to blood or other infectious body fluids, are household contacts or sex partners of hepatitis B positive people, are clients or workers in certain care facilities, or travel to or work in countries with a high rate of hepatitis B should be immunized.  Haemophilus influenzae type b (Hib) vaccine. A previously unvaccinated person with asplenia or sickle cell disease or having a scheduled splenectomy should receive 1 dose of Hib vaccine. Regardless of previous immunization, a recipient of a  hematopoietic stem cell transplant should receive a 3-dose series 6-12 months after her successful transplant. Hib vaccine is not recommended for adults with HIV infection. Preventive Services / Frequency Ages 19 to 39 years  Blood pressure check.** / Every 3-5 years.  Lipid and cholesterol check.** / Every 5 years beginning at age 20.  Clinical breast exam.** / Every 3 years for women in their 20s and 30s.  BRCA-related cancer risk assessment.** / For women who have family members with a BRCA-related cancer (breast, ovarian, tubal, or peritoneal cancers).  Pap test.** / Every 2 years from ages 21 through 29. Every 3 years starting at age 30 through age 65 or 70 with a history of 3 consecutive normal Pap tests.  HPV screening.** / Every 3 years from ages 30 through ages 65 to 70 with a history of 3 consecutive normal Pap tests.  Hepatitis C blood test.** / For any individual with known risks for hepatitis C.  Skin self-exam. / Monthly.  Influenza vaccine. / Every year.  Tetanus, diphtheria, and acellular pertussis (Tdap, Td) vaccine.** / Consult your health care provider. Pregnant women should receive 1 dose of Tdap vaccine during each pregnancy. 1 dose of Td every 10 years.  Varicella vaccine.** / Consult your health care provider. Pregnant females who do not have evidence of immunity should receive the first dose after pregnancy.  HPV vaccine. / 3 doses over 6 months, if 26 and younger. The vaccine is not recommended for use in pregnant females. However, pregnancy testing is not needed before receiving a dose.  Measles, mumps, rubella (MMR) vaccine.** / You need at least 1 dose of MMR if you were born in 1957 or later. You may also need a 2nd dose. For females of childbearing age, rubella immunity should be determined. If there is no evidence of immunity, females who are not pregnant should be vaccinated. If there is   no evidence of immunity, females who are pregnant should delay  immunization until after pregnancy.  Pneumococcal 13-valent conjugate (PCV13) vaccine.** / Consult your health care provider.  Pneumococcal polysaccharide (PPSV23) vaccine.** / 1 to 2 doses if you smoke cigarettes or if you have certain conditions.  Meningococcal vaccine.** / 1 dose if you are age 19 to 21 years and a first-year college student living in a residence hall, or have one of several medical conditions, you need to get vaccinated against meningococcal disease. You may also need additional booster doses.  Hepatitis A vaccine.** / Consult your health care provider.  Hepatitis B vaccine.** / Consult your health care provider.  Haemophilus influenzae type b (Hib) vaccine.** / Consult your health care provider. Ages 40 to 64 years  Blood pressure check.** / Every year.  Lipid and cholesterol check.** / Every 5 years beginning at age 20 years.  Lung cancer screening. / Every year if you are aged 55-80 years and have a 30-pack-year history of smoking and currently smoke or have quit within the past 15 years. Yearly screening is stopped once you have quit smoking for at least 15 years or develop a health problem that would prevent you from having lung cancer treatment.  Clinical breast exam.** / Every year after age 40 years.  BRCA-related cancer risk assessment.** / For women who have family members with a BRCA-related cancer (breast, ovarian, tubal, or peritoneal cancers).  Mammogram.** / Every year beginning at age 40 years and continuing for as long as you are in good health. Consult with your health care provider.  Pap test.** / Every 3 years starting at age 30 years through age 65 or 70 years with a history of 3 consecutive normal Pap tests.  HPV screening.** / Every 3 years from ages 30 years through ages 65 to 70 years with a history of 3 consecutive normal Pap tests.  Fecal occult blood test (FOBT) of stool. / Every year beginning at age 50 years and continuing until age  75 years. You may not need to do this test if you get a colonoscopy every 10 years.  Flexible sigmoidoscopy or colonoscopy.** / Every 5 years for a flexible sigmoidoscopy or every 10 years for a colonoscopy beginning at age 50 years and continuing until age 75 years.  Hepatitis C blood test.** / For all people born from 1945 through 1965 and any individual with known risks for hepatitis C.  Skin self-exam. / Monthly.  Influenza vaccine. / Every year.  Tetanus, diphtheria, and acellular pertussis (Tdap/Td) vaccine.** / Consult your health care provider. Pregnant women should receive 1 dose of Tdap vaccine during each pregnancy. 1 dose of Td every 10 years.  Varicella vaccine.** / Consult your health care provider. Pregnant females who do not have evidence of immunity should receive the first dose after pregnancy.  Zoster vaccine.** / 1 dose for adults aged 60 years or older.  Measles, mumps, rubella (MMR) vaccine.** / You need at least 1 dose of MMR if you were born in 1957 or later. You may also need a second dose. For females of childbearing age, rubella immunity should be determined. If there is no evidence of immunity, females who are not pregnant should be vaccinated. If there is no evidence of immunity, females who are pregnant should delay immunization until after pregnancy.  Pneumococcal 13-valent conjugate (PCV13) vaccine.** / Consult your health care provider.  Pneumococcal polysaccharide (PPSV23) vaccine.** / 1 to 2 doses if you smoke cigarettes or if you   have certain conditions.  Meningococcal vaccine.** / Consult your health care provider.  Hepatitis A vaccine.** / Consult your health care provider.  Hepatitis B vaccine.** / Consult your health care provider.  Haemophilus influenzae type b (Hib) vaccine.** / Consult your health care provider. Ages 65 years and over  Blood pressure check.** / Every year.  Lipid and cholesterol check.** / Every 5 years beginning at age 20  years.  Lung cancer screening. / Every year if you are aged 55-80 years and have a 30-pack-year history of smoking and currently smoke or have quit within the past 15 years. Yearly screening is stopped once you have quit smoking for at least 15 years or develop a health problem that would prevent you from having lung cancer treatment.  Clinical breast exam.** / Every year after age 40 years.  BRCA-related cancer risk assessment.** / For women who have family members with a BRCA-related cancer (breast, ovarian, tubal, or peritoneal cancers).  Mammogram.** / Every year beginning at age 40 years and continuing for as long as you are in good health. Consult with your health care provider.  Pap test.** / Every 3 years starting at age 30 years through age 65 or 70 years with 3 consecutive normal Pap tests. Testing can be stopped between 65 and 70 years with 3 consecutive normal Pap tests and no abnormal Pap or HPV tests in the past 10 years.  HPV screening.** / Every 3 years from ages 30 years through ages 65 or 70 years with a history of 3 consecutive normal Pap tests. Testing can be stopped between 65 and 70 years with 3 consecutive normal Pap tests and no abnormal Pap or HPV tests in the past 10 years.  Fecal occult blood test (FOBT) of stool. / Every year beginning at age 50 years and continuing until age 75 years. You may not need to do this test if you get a colonoscopy every 10 years.  Flexible sigmoidoscopy or colonoscopy.** / Every 5 years for a flexible sigmoidoscopy or every 10 years for a colonoscopy beginning at age 50 years and continuing until age 75 years.  Hepatitis C blood test.** / For all people born from 1945 through 1965 and any individual with known risks for hepatitis C.  Osteoporosis screening.** / A one-time screening for women ages 65 years and over and women at risk for fractures or osteoporosis.  Skin self-exam. / Monthly.  Influenza vaccine. / Every year.  Tetanus,  diphtheria, and acellular pertussis (Tdap/Td) vaccine.** / 1 dose of Td every 10 years.  Varicella vaccine.** / Consult your health care provider.  Zoster vaccine.** / 1 dose for adults aged 60 years or older.  Pneumococcal 13-valent conjugate (PCV13) vaccine.** / Consult your health care provider.  Pneumococcal polysaccharide (PPSV23) vaccine.** / 1 dose for all adults aged 65 years and older.  Meningococcal vaccine.** / Consult your health care provider.  Hepatitis A vaccine.** / Consult your health care provider.  Hepatitis B vaccine.** / Consult your health care provider.  Haemophilus influenzae type b (Hib) vaccine.** / Consult your health care provider. ** Family history and personal history of risk and conditions may change your health care provider's recommendations.   This information is not intended to replace advice given to you by your health care provider. Make sure you discuss any questions you have with your health care provider.   Document Released: 06/12/2001 Document Revised: 05/07/2014 Document Reviewed: 09/11/2010 Elsevier Interactive Patient Education 2016 Elsevier Inc.  

## 2015-12-14 ENCOUNTER — Encounter: Payer: Self-pay | Admitting: Internal Medicine

## 2015-12-16 ENCOUNTER — Encounter: Payer: Self-pay | Admitting: Internal Medicine

## 2015-12-17 NOTE — Therapy (Signed)
[facility] [address] [place], [place], [address] Phone: [phone]   Fax:  [phone]  Physical Therapy Treatment  Patient Details  Name: Julie Montgomery MRN: [id number] Date of Birth: [birth year] Referring Provider: Dr. Betty Martinique  Encounter Date: 12/13/2015      PT End of Session - 12/17/15 1921    Visit Number 2   Number of Visits 3   Date for PT Re-Evaluation 12/23/15   Authorization Type medicare   Authorization Time Period 11-22-15 - 01-21-16   PT Start Time 1017   PT Stop Time 1059   PT Time Calculation (min) 42 min      Past Medical History:  Diagnosis Date  . Anxiety   . Osteopenia   . Pneumonia    hx of 03/2008    Past Surgical History:  Procedure Laterality Date  . APPENDECTOMY    . BREAST LUMPECTOMY     right  . CESAREAN SECTION    . CHOLECYSTECTOMY    . PARTIAL HYSTERECTOMY    . ROTATOR CUFF REPAIR  2012    There were no vitals filed for this visit.      Subjective Assessment - 12/17/15 1915    Subjective Pt states the vertigo is much improved - is now able to tilt her head back for husband to put eye drops in her eyes and she does not get dizzy   Pertinent History Anxiety; Osteopenia   Patient Stated Goals resolve the vertigo   Currently in Pain? No/denies       R Dix-Hallpike test (-) with no nystagmus and no c/o vertigo in test position  Positional testing - (-) R and L sidelying tests with no c/o vertigo and no nystagmus  Self care - discussed etiology of BPPV, re-occurrence rates of BPPV and LTG's/progress with pt                             PT Long Term Goals - 12/17/15 1922      PT LONG TERM GOAL #1   Title Pt will have a (-) R Dix-Hallpike test to indicate resolution of R BPPV.  (12-23-15)   Baseline met 12-13-15   Status Achieved     PT LONG TERM GOAL #2   Title Independent in Brandt-Daroff exercises prn for habituation of vertigo.   (12-23-15)   Baseline met 12-13-15   Status Achieved     PT LONG TERM GOAL #3   Title Report no vertigo with bed mobility or ambulation.  (12-23-15)   Baseline met 12-13-15   Status Achieved               Plan - 12/17/15 1923    Clinical Impression Statement Pt has met 3/3 LTG's - R BPPV appears to be resolved at this time      Patient will benefit from skilled therapeutic intervention in order to improve the following deficits and impairments:     Visit Diagnosis: BPPV (benign paroxysmal positional vertigo), right     Problem List Patient Active Problem List   Diagnosis Date Noted  . Stress at home 10/18/2015  . Neck pain, acute 01/26/2015  . Abrasion of arm, left 11/05/2014  . Swelling of foot joint 10/14/2014  . Clostridium difficile colitis 10/04/2014  . Diarrhea 10/04/2014  . Numbness of left hand 04/19/2014  . Dysuria 03/19/2012  . LBP (low back pain) 03/19/2012  . Lipoma of arm  01/23/2012  . Impacted cerumen of both ears 05/25/2011  . Otitis externa 05/25/2011  . URI, acute 04/05/2011  . Well adult exam 04/03/2011  . Edema 02/13/2011  . Shoulder pain, right 01/30/2011  . Bronchiectasis (Angus) 01/23/2011  . Vertigo 09/13/2010  . Allergic rhinitis 09/13/2010  . CONSTIPATION 03/22/2010  . LUMBAGO 03/22/2010  . URINARY FREQUENCY, CHRONIC 03/22/2010  . Situational mixed anxiety and depressive disorder 02/25/2008  . Cough 02/17/2008  . Generalized anxiety disorder 02/07/2007  . INSOMNIA-SLEEP DISORDER-UNSPEC 02/07/2007  . WEIGHT LOSS, ABNORMAL 02/07/2007  . HYPERCHOLESTEROLEMIA, PURE 11/21/2006  . OSTEOPENIA 11/21/2006  . ALLERGY 11/21/2006     PHYSICAL THERAPY DISCHARGE SUMMARY  Visits from Start of Care: 2  Current functional level related to goals / functional outcomes: All LTG's met - R BPPV resolved -- see above for progress towards goals   Remaining deficits: None regarding vertigo   Education / Equipment: Pt has been instructed in  etiology of BPPV and in Brandt-Daroff exercises for habituation prn. Plan: Patient agrees to discharge.  Patient goals were met. Patient is being discharged due to meeting the stated rehab goals.  ?????       Alda Lea, PT 12/17/2015, 7:26 PM  [facility] [address] [place], [place], [address] Phone: [phone]   Fax:  [phone]  Name: Julie Montgomery MRN: [id number] Date of Birth: 30-Jan-1936

## 2015-12-20 ENCOUNTER — Ambulatory Visit (INDEPENDENT_AMBULATORY_CARE_PROVIDER_SITE_OTHER): Payer: Medicare Other | Admitting: Psychology

## 2015-12-20 DIAGNOSIS — F4323 Adjustment disorder with mixed anxiety and depressed mood: Secondary | ICD-10-CM | POA: Diagnosis not present

## 2015-12-29 ENCOUNTER — Other Ambulatory Visit: Payer: Self-pay

## 2016-01-09 DIAGNOSIS — L282 Other prurigo: Secondary | ICD-10-CM | POA: Diagnosis not present

## 2016-01-09 DIAGNOSIS — L237 Allergic contact dermatitis due to plants, except food: Secondary | ICD-10-CM | POA: Diagnosis not present

## 2016-01-09 DIAGNOSIS — Z85828 Personal history of other malignant neoplasm of skin: Secondary | ICD-10-CM | POA: Diagnosis not present

## 2016-01-17 ENCOUNTER — Ambulatory Visit (INDEPENDENT_AMBULATORY_CARE_PROVIDER_SITE_OTHER): Payer: Medicare Other | Admitting: Psychology

## 2016-01-17 DIAGNOSIS — Z85828 Personal history of other malignant neoplasm of skin: Secondary | ICD-10-CM | POA: Diagnosis not present

## 2016-01-17 DIAGNOSIS — F4323 Adjustment disorder with mixed anxiety and depressed mood: Secondary | ICD-10-CM

## 2016-01-17 DIAGNOSIS — L237 Allergic contact dermatitis due to plants, except food: Secondary | ICD-10-CM | POA: Diagnosis not present

## 2016-01-18 ENCOUNTER — Encounter: Payer: Self-pay | Admitting: Internal Medicine

## 2016-01-19 MED ORDER — LORAZEPAM 0.5 MG PO TABS: 0.5000 mg | ORAL_TABLET | Freq: Four times a day (QID) | ORAL | 0 refills | Status: DC | PRN

## 2016-01-19 NOTE — Telephone Encounter (Signed)
MD ok to call in Lorazepam. Called pharmacy had to leave on pharmacy vm...Julie Montgomery

## 2016-01-26 ENCOUNTER — Ambulatory Visit (INDEPENDENT_AMBULATORY_CARE_PROVIDER_SITE_OTHER): Payer: Medicare Other | Admitting: Psychology

## 2016-01-26 DIAGNOSIS — F4323 Adjustment disorder with mixed anxiety and depressed mood: Secondary | ICD-10-CM

## 2016-02-06 ENCOUNTER — Encounter: Payer: Self-pay | Admitting: Internal Medicine

## 2016-02-07 ENCOUNTER — Encounter: Payer: Self-pay | Admitting: Internal Medicine

## 2016-02-17 ENCOUNTER — Ambulatory Visit (INDEPENDENT_AMBULATORY_CARE_PROVIDER_SITE_OTHER): Payer: Medicare Other | Admitting: Internal Medicine

## 2016-02-17 ENCOUNTER — Encounter: Payer: Self-pay | Admitting: Internal Medicine

## 2016-02-17 VITALS — BP 128/70 | HR 64 | Temp 98.7°F | Wt 104.0 lb

## 2016-02-17 DIAGNOSIS — E785 Hyperlipidemia, unspecified: Secondary | ICD-10-CM

## 2016-02-17 DIAGNOSIS — F439 Reaction to severe stress, unspecified: Secondary | ICD-10-CM

## 2016-02-17 DIAGNOSIS — R0989 Other specified symptoms and signs involving the circulatory and respiratory systems: Secondary | ICD-10-CM | POA: Insufficient documentation

## 2016-02-17 DIAGNOSIS — Z01818 Encounter for other preprocedural examination: Secondary | ICD-10-CM | POA: Insufficient documentation

## 2016-02-17 DIAGNOSIS — M25562 Pain in left knee: Secondary | ICD-10-CM | POA: Diagnosis not present

## 2016-02-17 DIAGNOSIS — M5440 Lumbago with sciatica, unspecified side: Secondary | ICD-10-CM | POA: Diagnosis not present

## 2016-02-17 DIAGNOSIS — F411 Generalized anxiety disorder: Secondary | ICD-10-CM

## 2016-02-17 DIAGNOSIS — Z23 Encounter for immunization: Secondary | ICD-10-CM

## 2016-02-17 DIAGNOSIS — R197 Diarrhea, unspecified: Secondary | ICD-10-CM

## 2016-02-17 NOTE — Assessment & Plan Note (Signed)
Contusion X ray if not better

## 2016-02-17 NOTE — Progress Notes (Signed)
Subjective:  Patient ID: Julie Montgomery, female    DOB: [birth year]  Age: 80 y.o. MRN: [id number]  CC: No chief complaint on file.   HPI Julie Montgomery presents for IM pre-op consultation Req by Dr Alean Rinne, DDS Reason: med clearance for oral surgery, calcification of the arteries on the dental X ray Hx: the pt has a h/o stress, anxiety - stable The pt tripped and fell 3 weeks ago: L knee hurts a little. The pt is planning to have dentures may be. No LOC  Outpatient Medications Prior to Visit  Medication Sig Dispense Refill  . acetaminophen (TYLENOL) 500 MG tablet Take 500 mg by mouth every 6 (six) hours as needed for mild pain.     . Ascorbic Acid (VITAMIN C PO) Take by mouth.    . Cholecalciferol (VITAMIN D3) 1000 UNITS CAPS Take 1 capsule by mouth daily. Take total of 2 daily    . guaiFENesin (MUCINEX) 600 MG 12 hr tablet Take by mouth 2 (two) times daily.    Marland Kitchen LORazepam (ATIVAN) 0.5 MG tablet Take 1 tablet (0.5 mg total) by mouth every 6 (six) hours as needed for anxiety. 30 tablet 0  . escitalopram (LEXAPRO) 5 MG tablet Take 1 tablet (5 mg total) by mouth at bedtime. (Patient not taking: Reported on 02/17/2016) 30 tablet 5  . meclizine (ANTIVERT) 25 MG tablet Take 1 tablet (25 mg total) by mouth 3 (three) times daily as needed for dizziness. (Patient not taking: Reported on 02/17/2016) 30 tablet 0  . ondansetron (ZOFRAN) 4 MG tablet Take 1 tablet (4 mg total) by mouth every 8 (eight) hours as needed for nausea or vomiting. (Patient not taking: Reported on 02/17/2016) 15 tablet 0   No facility-administered medications prior to visit.    Past Medical History:  Diagnosis Date  . Anxiety   . Osteopenia   . Pneumonia    hx of 03/2008   Past Surgical History:  Procedure Laterality Date  . APPENDECTOMY    . BREAST LUMPECTOMY     right  . CESAREAN SECTION    . CHOLECYSTECTOMY    . PARTIAL HYSTERECTOMY    . ROTATOR CUFF REPAIR  2012    reports that she has never smoked.  She has never used smokeless tobacco. She reports that she does not drink alcohol or use drugs. family history includes Cancer in her brother and sister; Coronary artery disease in her brother; Dementia in her brother; Diabetes in her sister; Stomach cancer in her paternal grandmother. Allergies  Allergen Reactions  . Fluoxetine Hcl     REACTION: jumpy  . Lovastatin   . Mometasone Furo-Formoterol Fum     Loss of appetite, laryngitis   . Sulfa Antibiotics     ROS Review of Systems  Constitutional: Negative for activity change, appetite change, chills, fatigue and unexpected weight change.  HENT: Negative for congestion, mouth sores and sinus pressure.   Eyes: Negative for visual disturbance.  Respiratory: Negative for cough and chest tightness.   Gastrointestinal: Negative for abdominal pain and nausea.  Genitourinary: Negative for difficulty urinating, frequency and vaginal pain.  Musculoskeletal: Negative for back pain and gait problem.  Skin: Negative for pallor and rash.  Neurological: Negative for dizziness, tremors, weakness, numbness and headaches.  Psychiatric/Behavioral: Negative for confusion and sleep disturbance. The patient is nervous/anxious.     Objective:  BP 128/70   Pulse 64   Temp 98.7 F (37.1 C) (Oral)   Wt 104 lb (47.2  kg)   SpO2 97%   BMI 19.33 kg/m   BP Readings from Last 3 Encounters:  02/17/16 128/70  12/13/15 128/70  11/25/15 (!) 143/80    Wt Readings from Last 3 Encounters:  02/17/16 104 lb (47.2 kg)  12/13/15 103 lb (46.7 kg)  10/18/15 103 lb (46.7 kg)    Physical Exam  Constitutional: She appears well-developed. No distress.  HENT:  Head: Normocephalic.  Right Ear: External ear normal.  Left Ear: External ear normal.  Nose: Nose normal.  Mouth/Throat: Oropharynx is clear and moist.  Eyes: Conjunctivae are normal. Pupils are equal, round, and reactive to light. Right eye exhibits no discharge. Left eye exhibits no discharge.  Neck:  Normal range of motion. Neck supple. No JVD present. No tracheal deviation present. No thyromegaly present.  Cardiovascular: Normal rate, regular rhythm and normal heart sounds.   Pulmonary/Chest: No stridor. No respiratory distress. She has no wheezes.  Abdominal: Soft. Bowel sounds are normal. She exhibits no distension and no mass. There is no tenderness. There is no rebound and no guarding.  Musculoskeletal: She exhibits tenderness. She exhibits no edema.  Lymphadenopathy:    She has no cervical adenopathy.  Neurological: She displays normal reflexes. No cranial nerve deficit. She exhibits normal muscle tone. Coordination normal.  Skin: No rash noted. No erythema.  Psychiatric: She has a normal mood and affect. Her behavior is normal. Judgment and thought content normal.  L knee cap is yellow w/swelling Mild B bruit  Lab Results  Component Value Date   WBC 6.5 12/13/2015   HGB 13.5 12/13/2015   HCT 39.4 12/13/2015   PLT 241.0 12/13/2015   GLUCOSE 90 12/13/2015   CHOL 215 (H) 12/13/2015   TRIG 84.0 12/13/2015   HDL 63.50 12/13/2015   LDLDIRECT 145.7 05/12/2013   LDLCALC 134 (H) 12/13/2015   ALT 11 12/13/2015   AST 15 12/13/2015   NA 142 12/13/2015   K 4.4 12/13/2015   CL 106 12/13/2015   CREATININE 0.69 12/13/2015   BUN 18 12/13/2015   CO2 28 12/13/2015   TSH 3.64 12/13/2015    Mm Diag Breast Tomo Uni Left  Result Date: 11/17/2015 CLINICAL DATA:  Patient returns today to evaluate a possible left breast asymmetry questioned on recent screening mammogram. EXAM: 2D DIGITAL DIAGNOSTIC UNILATERAL LEFT MAMMOGRAM WITH CAD AND ADJUNCT TOMO COMPARISON:  Previous exams including recent screening mammogram dated 11/10/2015. ACR Breast Density Category b: There are scattered areas of fibroglandular density. FINDINGS: On today's additional views with spot compression and 3D tomosynthesis, there is no persistent asymmetry within the upper left breast indicating superimposition of normal  fibroglandular tissues. Biopsy clip within the upper outer quadrant is stable in position. There are no new dominant masses, suspicious calcifications or secondary signs of malignancy identified on today's exam. Mammographic images were processed with CAD. IMPRESSION: No evidence of malignancy. Patient may return to routine annual bilateral screening mammogram schedule. RECOMMENDATION: Screening mammogram in one year.(Code:[id number]) I have discussed the findings and recommendations with the patient. Results were also provided in writing at the conclusion of the visit. If applicable, a reminder letter will be sent to the patient regarding the next appointment. BI-RADS CATEGORY  2: Benign. Electronically Signed   By: Franki Cabot M.D.   On: 11/17/2015 14:35    Assessment & Plan:   There are no diagnoses linked to this encounter. I am having Ms. Fricke maintain her Vitamin D3, acetaminophen, guaiFENesin, Ascorbic Acid (VITAMIN C PO), meclizine, ondansetron, escitalopram, and LORazepam.  No orders of the defined types were placed in this encounter.    Follow-up: No Follow-up on file.  Walker Kehr, MD

## 2016-02-17 NOTE — Assessment & Plan Note (Signed)
Doing well 

## 2016-02-17 NOTE — Addendum Note (Signed)
Addended by: Cresenciano Lick on: 02/17/2016 04:55 PM   Modules accepted: Orders

## 2016-02-17 NOTE — Assessment & Plan Note (Signed)
F/u w/Dr Rexene Edison

## 2016-02-17 NOTE — Progress Notes (Signed)
Pre visit review using our clinic review tool, if applicable. No additional management support is needed unless otherwise documented below in the visit note. 

## 2016-02-17 NOTE — Assessment & Plan Note (Signed)
The pt should be medically clear for oral surgery. Her calcification of the arteries on the dental X ray are likely to be age related. Her recent labs were ok. We will obtain a carotid arteries doppler ultrasound to make sure. Her risks for oral surgery should be average. Thank you!

## 2016-02-17 NOTE — Assessment & Plan Note (Addendum)
Mild B Carotid doppler US ordered

## 2016-02-17 NOTE — Assessment & Plan Note (Signed)
Chronic Alprazolam prn - rare Potential benefits of a long term benzodiazepines  use as well as potential risks  and complications were explained to the patient and were aknowledged. 6/17 She was thinking of getting another dog (her collie Sugar died last summer)

## 2016-02-17 NOTE — Assessment & Plan Note (Addendum)
Doing well - no diarrhea. H/o C diff colitis She will use Florastor or Align if antibiotics are given.

## 2016-02-17 NOTE — Assessment & Plan Note (Signed)
Chronic  - mild Declined statins

## 2016-02-28 ENCOUNTER — Encounter: Payer: Self-pay | Admitting: Internal Medicine

## 2016-02-29 ENCOUNTER — Other Ambulatory Visit: Payer: Self-pay | Admitting: Internal Medicine

## 2016-02-29 ENCOUNTER — Ambulatory Visit (INDEPENDENT_AMBULATORY_CARE_PROVIDER_SITE_OTHER): Payer: Medicare Other | Admitting: Adult Health

## 2016-02-29 ENCOUNTER — Encounter: Payer: Self-pay | Admitting: Adult Health

## 2016-02-29 VITALS — BP 110/52 | Temp 98.8°F | Ht 61.5 in | Wt 103.7 lb

## 2016-02-29 DIAGNOSIS — R0989 Other specified symptoms and signs involving the circulatory and respiratory systems: Secondary | ICD-10-CM

## 2016-02-29 DIAGNOSIS — K529 Noninfective gastroenteritis and colitis, unspecified: Secondary | ICD-10-CM | POA: Diagnosis not present

## 2016-02-29 DIAGNOSIS — J069 Acute upper respiratory infection, unspecified: Secondary | ICD-10-CM | POA: Diagnosis not present

## 2016-02-29 MED ORDER — ONDANSETRON HCL 4 MG PO TABS: 4.0000 mg | ORAL_TABLET | Freq: Three times a day (TID) | ORAL | 0 refills | Status: DC | PRN

## 2016-02-29 NOTE — Progress Notes (Signed)
Subjective:    Patient ID: TAJ DILS, female    DOB: 05-31-1935, 80 y.o.   MRN: [id number]  HPI  80 year old female who  has a past medical history of Anxiety; Osteopenia; and Pneumonia. She presents to the office today for less than 24 hours of nausea, fatigue, productive cough that has turned into a dry cough, sinus pain and pressure and low grade grade fever.   As the morning progressed she started to feel improved until over the last few hours she has started to feel ill again.   She denies any diarrhea or vomiting.    Review of Systems  HENT: Positive for postnasal drip, rhinorrhea and sinus pressure. Negative for ear discharge, ear pain and sore throat.   Respiratory: Positive for cough. Negative for chest tightness and wheezing.   Cardiovascular: Negative.   Gastrointestinal: Positive for nausea. Negative for abdominal distention, constipation, diarrhea and vomiting.  Musculoskeletal: Negative.   Psychiatric/Behavioral: Negative.   All other systems reviewed and are negative.  Past Medical History:  Diagnosis Date  . Anxiety   . Osteopenia   . Pneumonia    hx of 03/2008    Social History   Social History  . Marital status: Married    Spouse name: N/A  . Number of children: 4  . Years of education: N/A   Occupational History  . Substitute Teacher    Social History Main Topics  . Smoking status: Never Smoker  . Smokeless tobacco: Never Used  . Alcohol use No  . Drug use: No  . Sexual activity: Not on file   Other Topics Concern  . Not on file   Social History Narrative   Married, Retired, she was looking after her GGD, now in court battle    Past Surgical History:  Procedure Laterality Date  . APPENDECTOMY    . BREAST LUMPECTOMY     right  . CESAREAN SECTION    . CHOLECYSTECTOMY    . PARTIAL HYSTERECTOMY    . ROTATOR CUFF REPAIR  2012    Family History  Problem Relation Age of Onset  . Diabetes Sister   . Cancer Sister     lung ca    . Coronary artery disease Brother   . Dementia Brother   . Cancer Brother     lung ca  . Stomach cancer Paternal Grandmother     Allergies  Allergen Reactions  . Fluoxetine Hcl     REACTION: jumpy  . Lovastatin   . Mometasone Furo-Formoterol Fum     Loss of appetite, laryngitis   . Sulfa Antibiotics     Current Outpatient Prescriptions on File Prior to Visit  Medication Sig Dispense Refill  . acetaminophen (TYLENOL) 500 MG tablet Take 500 mg by mouth every 6 (six) hours as needed for mild pain.     . Ascorbic Acid (VITAMIN C PO) Take by mouth.    . Cholecalciferol (VITAMIN D3) 1000 UNITS CAPS Take 1 capsule by mouth daily. Take total of 2 daily    . escitalopram (LEXAPRO) 5 MG tablet Take 1 tablet (5 mg total) by mouth at bedtime. 30 tablet 5  . guaiFENesin (MUCINEX) 600 MG 12 hr tablet Take by mouth 2 (two) times daily.    Marland Kitchen LORazepam (ATIVAN) 0.5 MG tablet Take 1 tablet (0.5 mg total) by mouth every 6 (six) hours as needed for anxiety. 30 tablet 0  . meclizine (ANTIVERT) 25 MG tablet Take 1 tablet (25 mg total)  by mouth 3 (three) times daily as needed for dizziness. 30 tablet 0   No current facility-administered medications on file prior to visit.     BP (!) 110/52   Temp 98.8 F (37.1 C) (Oral)   Ht 5' 1.5" (1.562 m)   Wt 103 lb 11.2 oz (47 kg)   BMI 19.28 kg/m       Objective:   Physical Exam  Constitutional: She is oriented to person, place, and time. She appears well-developed and well-nourished. No distress.  HENT:  Head: Normocephalic and atraumatic.  Right Ear: External ear normal.  Left Ear: External ear normal.  Nose: Nose normal.  Mouth/Throat: Oropharynx is clear and moist.  Eyes: Conjunctivae and EOM are normal. Pupils are equal, round, and reactive to light. Right eye exhibits no discharge. Left eye exhibits no discharge.  Neck: Normal range of motion. Neck supple. No thyromegaly present.  Cardiovascular: Normal rate, regular rhythm, normal heart  sounds and intact distal pulses.  Exam reveals no gallop and no friction rub.   No murmur heard. Pulmonary/Chest: Effort normal and breath sounds normal. No respiratory distress. She has no wheezes. She has no rales. She exhibits no tenderness.  Abdominal: Soft. Normal appearance and bowel sounds are normal. She exhibits no distension and no mass. There is generalized tenderness. There is no rebound and no guarding.  Lymphadenopathy:    She has no cervical adenopathy.  Neurological: She is alert and oriented to person, place, and time.  Skin: Skin is warm and dry. No rash noted. She is not diaphoretic. No erythema. No pallor.  Psychiatric: She has a normal mood and affect. Her behavior is normal. Judgment and thought content normal.  Nursing note and vitals reviewed.     Assessment & Plan:  1. Upper respiratory tract infection, unspecified type - Advised to use Flonase  - Stay hydrated and rest - Follow up as needed  2. Gastroenteritis, acute - ondansetron (ZOFRAN) 4 MG tablet; Take 1 tablet (4 mg total) by mouth every 8 (eight) hours as needed for nausea or vomiting.  Dispense: 20 tablet; Refill: 0 - Bland diet - Stay hydrated and rest - Follow up as needed  Dorothyann Peng, NP

## 2016-02-29 NOTE — Patient Instructions (Signed)
It was great meeting you today.   I am sorry you are feeling this way. Form your exam, it appears as though this is a viral illness and should pass within a few days  Use the zofran as needed for nausea. Eat a bland diet for the next few days and stay hydrated  Follow up if no improvement in the next 2-3 days

## 2016-03-01 ENCOUNTER — Other Ambulatory Visit: Payer: Self-pay | Admitting: Internal Medicine

## 2016-03-01 DIAGNOSIS — R0989 Other specified symptoms and signs involving the circulatory and respiratory systems: Secondary | ICD-10-CM

## 2016-03-04 ENCOUNTER — Encounter: Payer: Self-pay | Admitting: Internal Medicine

## 2016-03-05 ENCOUNTER — Telehealth: Payer: Self-pay | Admitting: Emergency Medicine

## 2016-03-05 NOTE — Telephone Encounter (Signed)
Noted - agree w/advice I could work him in on Thursday (if it can wait) Thx

## 2016-03-05 NOTE — Telephone Encounter (Signed)
-----   Message -----    From: Lacretia Nicks. [employer]    Sent: 03/04/2016 11:36 AM EST      To: Walker Kehr, MD Subject: Visit Follow-Up Question  Been sick since 10/31.  Saw Dr. at [facility].  Gave no meds.  I feel like I am going to dye.  Hurt every bone, skin, head, can't eat.  Cough large chunks green phlegm.  Head hurts BAD.  Even urine is brown.  Can't make it like this.  If you can't see me, how about Terri Piedra?  Court Tues.  Don sick nine days same things but keeps going, still coughing green phlegm.  Have taken a few Tylenol.  Thanks, Jacqlyn Krauss   Pls sch [name] and Timmothy Sours to see Bennie Pierini or Dr Jenny Reichmann tomorrow Thx AP

## 2016-03-05 NOTE — Telephone Encounter (Addendum)
Pt called and stated her and her husband have been very sick. She asked if we had any available appts. I let her know Dr Camila Li was full all week and that we had no available appts with a provider at this location today. I offered another provider another day of the week or another location. She refused another location because she went to [facility] last week and they didn't do anything to hel her. I offered to transfer her to the triage nurse but she stated they cant see her so that wouldn't do any good either. She got upset and stated we have too many patients since they can not be seen when they call and need to be. I offered again appts another day this week but she stated hey had to be seen today. I explained we didn't have any appts available and since she refused another location or to speak to the triage nurse then she went need to go to Urgent care or the ER if she needed to be seen. Thanks.

## 2016-03-06 ENCOUNTER — Ambulatory Visit: Payer: Medicare Other | Admitting: Nurse Practitioner

## 2016-03-06 ENCOUNTER — Inpatient Hospital Stay (HOSPITAL_COMMUNITY): Admission: RE | Admit: 2016-03-06 | Payer: Medicare Other | Source: Ambulatory Visit

## 2016-03-06 NOTE — Telephone Encounter (Signed)
Dustin P., [facility] scheduler/Team lead to call pt and schedule OV today.

## 2016-03-09 ENCOUNTER — Inpatient Hospital Stay (HOSPITAL_COMMUNITY): Admission: RE | Admit: 2016-03-09 | Payer: Medicare Other | Source: Ambulatory Visit

## 2016-03-13 ENCOUNTER — Ambulatory Visit (INDEPENDENT_AMBULATORY_CARE_PROVIDER_SITE_OTHER): Payer: Medicare Other | Admitting: Psychology

## 2016-03-13 DIAGNOSIS — F4323 Adjustment disorder with mixed anxiety and depressed mood: Secondary | ICD-10-CM

## 2016-03-28 ENCOUNTER — Ambulatory Visit (HOSPITAL_COMMUNITY)
Admission: RE | Admit: 2016-03-28 | Discharge: 2016-03-28 | Disposition: A | Discharge status: Home / Self Care | Payer: Medicare Other | Source: Ambulatory Visit | Attending: Cardiovascular Disease | Admitting: Cardiovascular Disease

## 2016-03-28 DIAGNOSIS — I6523 Occlusion and stenosis of bilateral carotid arteries: Secondary | ICD-10-CM | POA: Diagnosis not present

## 2016-03-28 DIAGNOSIS — R0989 Other specified symptoms and signs involving the circulatory and respiratory systems: Secondary | ICD-10-CM

## 2016-03-29 ENCOUNTER — Other Ambulatory Visit: Payer: Self-pay | Admitting: Internal Medicine

## 2016-03-29 MED ORDER — ASPIRIN EC 81 MG PO TBEC: 81.0000 mg | DELAYED_RELEASE_TABLET | Freq: Every day | ORAL | 3 refills | Status: AC

## 2016-03-31 ENCOUNTER — Encounter: Payer: Self-pay | Admitting: Internal Medicine

## 2016-04-03 ENCOUNTER — Encounter: Payer: Self-pay | Admitting: Internal Medicine

## 2016-04-05 ENCOUNTER — Other Ambulatory Visit: Payer: Self-pay | Admitting: *Deleted

## 2016-04-05 DIAGNOSIS — H8110 Benign paroxysmal vertigo, unspecified ear: Secondary | ICD-10-CM

## 2016-04-05 MED ORDER — MECLIZINE HCL 25 MG PO TABS: 25.0000 mg | ORAL_TABLET | Freq: Three times a day (TID) | ORAL | 0 refills | Status: DC | PRN

## 2016-04-05 NOTE — Telephone Encounter (Signed)
Rec'd call pt states she has miss place her meclizine bottle and she is needing refill due to her vertigo sxs. Verified pharmacy inform pt will send to [facility]...[name]

## 2016-04-11 DIAGNOSIS — L570 Actinic keratosis: Secondary | ICD-10-CM | POA: Diagnosis not present

## 2016-04-11 DIAGNOSIS — Z85828 Personal history of other malignant neoplasm of skin: Secondary | ICD-10-CM | POA: Diagnosis not present

## 2016-05-02 DIAGNOSIS — R0982 Postnasal drip: Secondary | ICD-10-CM | POA: Diagnosis not present

## 2016-05-02 DIAGNOSIS — J479 Bronchiectasis, uncomplicated: Secondary | ICD-10-CM | POA: Diagnosis not present

## 2016-05-07 DIAGNOSIS — H5213 Myopia, bilateral: Secondary | ICD-10-CM | POA: Diagnosis not present

## 2016-05-07 DIAGNOSIS — H04123 Dry eye syndrome of bilateral lacrimal glands: Secondary | ICD-10-CM | POA: Diagnosis not present

## 2016-05-07 DIAGNOSIS — H43813 Vitreous degeneration, bilateral: Secondary | ICD-10-CM | POA: Diagnosis not present

## 2016-05-07 DIAGNOSIS — H26493 Other secondary cataract, bilateral: Secondary | ICD-10-CM | POA: Diagnosis not present

## 2016-06-21 DIAGNOSIS — H26491 Other secondary cataract, right eye: Secondary | ICD-10-CM | POA: Diagnosis not present

## 2016-06-27 ENCOUNTER — Encounter: Payer: Self-pay | Admitting: Internal Medicine

## 2016-06-28 DIAGNOSIS — H26492 Other secondary cataract, left eye: Secondary | ICD-10-CM | POA: Diagnosis not present

## 2016-07-09 ENCOUNTER — Encounter: Payer: Self-pay | Admitting: Internal Medicine

## 2016-07-30 ENCOUNTER — Ambulatory Visit (INDEPENDENT_AMBULATORY_CARE_PROVIDER_SITE_OTHER): Payer: Medicare Other | Admitting: Internal Medicine

## 2016-07-30 ENCOUNTER — Encounter: Payer: Self-pay | Admitting: Internal Medicine

## 2016-07-30 DIAGNOSIS — E785 Hyperlipidemia, unspecified: Secondary | ICD-10-CM

## 2016-07-30 DIAGNOSIS — F439 Reaction to severe stress, unspecified: Secondary | ICD-10-CM | POA: Diagnosis not present

## 2016-07-30 DIAGNOSIS — F411 Generalized anxiety disorder: Secondary | ICD-10-CM

## 2016-07-30 NOTE — Assessment & Plan Note (Signed)
Doing better.   

## 2016-07-30 NOTE — Progress Notes (Signed)
Subjective:  Patient ID: Julie Montgomery, female    DOB: 03/07/1936  Age: 81 y.o. MRN: [id number]  CC: No chief complaint on file.   HPI AKELIA HUSTED presents for wt loss, dental issues, anxiety f/u. Not taking any Rx  Outpatient Medications Prior to Visit  Medication Sig Dispense Refill  . acetaminophen (TYLENOL) 500 MG tablet Take 500 mg by mouth every 6 (six) hours as needed for mild pain.     . Ascorbic Acid (VITAMIN C PO) Take by mouth.    . Cholecalciferol (VITAMIN D3) 1000 UNITS CAPS Take 1 capsule by mouth daily. Take total of 2 daily    . guaiFENesin (MUCINEX) 600 MG 12 hr tablet Take by mouth 2 (two) times daily.    Marland Kitchen aspirin EC 81 MG tablet Take 1 tablet (81 mg total) by mouth daily. (Patient not taking: Reported on 07/30/2016) 100 tablet 3  . escitalopram (LEXAPRO) 5 MG tablet Take 1 tablet (5 mg total) by mouth at bedtime. (Patient not taking: Reported on 07/30/2016) 30 tablet 5  . LORazepam (ATIVAN) 0.5 MG tablet Take 1 tablet (0.5 mg total) by mouth every 6 (six) hours as needed for anxiety. (Patient not taking: Reported on 07/30/2016) 30 tablet 0  . meclizine (ANTIVERT) 25 MG tablet Take 1 tablet (25 mg total) by mouth 3 (three) times daily as needed for dizziness. (Patient not taking: Reported on 07/30/2016) 30 tablet 0  . ondansetron (ZOFRAN) 4 MG tablet Take 1 tablet (4 mg total) by mouth every 8 (eight) hours as needed for nausea or vomiting. (Patient not taking: Reported on 07/30/2016) 20 tablet 0   No facility-administered medications prior to visit.     ROS Review of Systems  Constitutional: Negative for activity change, appetite change, chills, fatigue and unexpected weight change.  HENT: Negative for congestion, mouth sores and sinus pressure.   Eyes: Negative for visual disturbance.  Respiratory: Negative for cough and chest tightness.   Gastrointestinal: Negative for abdominal pain and nausea.  Genitourinary: Negative for difficulty urinating, frequency and  vaginal pain.  Musculoskeletal: Negative for back pain and gait problem.  Skin: Negative for pallor and rash.  Neurological: Negative for dizziness, tremors, weakness, numbness and headaches.  Psychiatric/Behavioral: Negative for confusion, sleep disturbance and suicidal ideas.    Objective:  BP 110/82   Pulse 68   Temp 97.6 F (36.4 C)   Ht 5' 1.5" (1.562 m)   Wt 103 lb (46.7 kg)   SpO2 98%   BMI 19.15 kg/m   BP Readings from Last 3 Encounters:  07/30/16 110/82  02/29/16 (!) 110/52  02/17/16 128/70    Wt Readings from Last 3 Encounters:  07/30/16 103 lb (46.7 kg)  02/29/16 103 lb 11.2 oz (47 kg)  02/17/16 104 lb (47.2 kg)    Physical Exam  Constitutional: She appears well-developed. No distress.  HENT:  Head: Normocephalic.  Right Ear: External ear normal.  Left Ear: External ear normal.  Nose: Nose normal.  Mouth/Throat: Oropharynx is clear and moist.  Eyes: Conjunctivae are normal. Pupils are equal, round, and reactive to light. Right eye exhibits no discharge. Left eye exhibits no discharge.  Neck: Normal range of motion. Neck supple. No JVD present. No tracheal deviation present. No thyromegaly present.  Cardiovascular: Normal rate, regular rhythm and normal heart sounds.   Pulmonary/Chest: No stridor. No respiratory distress. She has no wheezes.  Abdominal: Soft. Bowel sounds are normal. She exhibits no distension and no mass. There is no tenderness.  There is no rebound and no guarding.  Musculoskeletal: She exhibits no edema or tenderness.  Lymphadenopathy:    She has no cervical adenopathy.  Neurological: She displays normal reflexes. No cranial nerve deficit. She exhibits normal muscle tone. Coordination normal.  Skin: No rash noted. No erythema.  Psychiatric: She has a normal mood and affect. Her behavior is normal. Judgment and thought content normal.  dentures  Lab Results  Component Value Date   WBC 6.5 12/13/2015   HGB 13.5 12/13/2015   HCT 39.4  12/13/2015   PLT 241.0 12/13/2015   GLUCOSE 90 12/13/2015   CHOL 215 (H) 12/13/2015   TRIG 84.0 12/13/2015   HDL 63.50 12/13/2015   LDLDIRECT 145.7 05/12/2013   LDLCALC 134 (H) 12/13/2015   ALT 11 12/13/2015   AST 15 12/13/2015   NA 142 12/13/2015   K 4.4 12/13/2015   CL 106 12/13/2015   CREATININE 0.69 12/13/2015   BUN 18 12/13/2015   CO2 28 12/13/2015   TSH 3.64 12/13/2015    No results found.  Assessment & Plan:   There are no diagnoses linked to this encounter. I am having Ms. Ontko maintain her Vitamin D3, acetaminophen, guaiFENesin, Ascorbic Acid (VITAMIN C PO), escitalopram, LORazepam, ondansetron, aspirin EC, and meclizine.  No orders of the defined types were placed in this encounter.    Follow-up: No Follow-up on file.  Walker Kehr, MD

## 2016-07-30 NOTE — Assessment & Plan Note (Signed)
  On diet  

## 2016-07-30 NOTE — Assessment & Plan Note (Signed)
Alprazolam prn Lexapro prn

## 2016-08-15 ENCOUNTER — Emergency Department (HOSPITAL_COMMUNITY): Payer: Medicare Other

## 2016-08-15 ENCOUNTER — Encounter (HOSPITAL_COMMUNITY): Payer: Self-pay | Admitting: *Deleted

## 2016-08-15 DIAGNOSIS — M25532 Pain in left wrist: Secondary | ICD-10-CM | POA: Diagnosis not present

## 2016-08-15 DIAGNOSIS — M25552 Pain in left hip: Secondary | ICD-10-CM | POA: Diagnosis not present

## 2016-08-15 DIAGNOSIS — Z7982 Long term (current) use of aspirin: Secondary | ICD-10-CM | POA: Diagnosis not present

## 2016-08-15 DIAGNOSIS — Y9222 Religious institution as the place of occurrence of the external cause: Secondary | ICD-10-CM | POA: Insufficient documentation

## 2016-08-15 DIAGNOSIS — Z79899 Other long term (current) drug therapy: Secondary | ICD-10-CM | POA: Insufficient documentation

## 2016-08-15 DIAGNOSIS — Y939 Activity, unspecified: Secondary | ICD-10-CM | POA: Diagnosis not present

## 2016-08-15 DIAGNOSIS — Y999 Unspecified external cause status: Secondary | ICD-10-CM | POA: Diagnosis not present

## 2016-08-15 DIAGNOSIS — S6992XA Unspecified injury of left wrist, hand and finger(s), initial encounter: Secondary | ICD-10-CM | POA: Diagnosis not present

## 2016-08-15 DIAGNOSIS — S59902A Unspecified injury of left elbow, initial encounter: Secondary | ICD-10-CM | POA: Diagnosis not present

## 2016-08-15 DIAGNOSIS — S79912A Unspecified injury of left hip, initial encounter: Secondary | ICD-10-CM | POA: Diagnosis not present

## 2016-08-15 DIAGNOSIS — M25522 Pain in left elbow: Secondary | ICD-10-CM | POA: Diagnosis not present

## 2016-08-15 DIAGNOSIS — W010XXA Fall on same level from slipping, tripping and stumbling without subsequent striking against object, initial encounter: Secondary | ICD-10-CM | POA: Diagnosis not present

## 2016-08-15 MED ORDER — HYDROCODONE-ACETAMINOPHEN 5-325 MG PO TABS
1.0000 | ORAL_TABLET | Freq: Once | ORAL | Status: AC
  Administered 2016-08-15: 1 via ORAL

## 2016-08-15 MED ORDER — ONDANSETRON 4 MG PO TBDP
4.0000 mg | ORAL_TABLET | Freq: Once | ORAL | Status: AC
  Administered 2016-08-15: 4 mg via ORAL

## 2016-08-15 MED ORDER — ONDANSETRON 4 MG PO TBDP
ORAL_TABLET | ORAL | Status: AC
  Filled 2016-08-15: qty 1

## 2016-08-15 MED ORDER — HYDROCODONE-ACETAMINOPHEN 5-325 MG PO TABS
ORAL_TABLET | ORAL | Status: AC
  Filled 2016-08-15: qty 1

## 2016-08-15 NOTE — ED Triage Notes (Signed)
Patient stated she was in the gym at church and tripped over a ball landing on her left arm and hip.  Deformity noted to left wrist (ice pack given) patient ambulatory from the waiting area

## 2016-08-16 ENCOUNTER — Emergency Department (HOSPITAL_COMMUNITY)
Admission: EM | Admit: 2016-08-16 | Discharge: 2016-08-16 | Disposition: A | Discharge status: Home / Self Care | Payer: Medicare Other | Attending: Emergency Medicine | Admitting: Emergency Medicine

## 2016-08-16 ENCOUNTER — Emergency Department (HOSPITAL_COMMUNITY): Payer: Medicare Other

## 2016-08-16 DIAGNOSIS — S79912A Unspecified injury of left hip, initial encounter: Secondary | ICD-10-CM | POA: Diagnosis not present

## 2016-08-16 DIAGNOSIS — M25522 Pain in left elbow: Secondary | ICD-10-CM | POA: Diagnosis not present

## 2016-08-16 DIAGNOSIS — M25552 Pain in left hip: Secondary | ICD-10-CM

## 2016-08-16 DIAGNOSIS — M25532 Pain in left wrist: Secondary | ICD-10-CM

## 2016-08-16 DIAGNOSIS — S59902A Unspecified injury of left elbow, initial encounter: Secondary | ICD-10-CM | POA: Diagnosis not present

## 2016-08-16 DIAGNOSIS — W19XXXA Unspecified fall, initial encounter: Secondary | ICD-10-CM

## 2016-08-16 MED ORDER — IBUPROFEN 400 MG PO TABS
400.0000 mg | ORAL_TABLET | Freq: Once | ORAL | Status: AC
  Administered 2016-08-16: 400 mg via ORAL
  Filled 2016-08-16: qty 1

## 2016-08-16 NOTE — Discharge Instructions (Signed)
You do not have a fracture. Wear the splint for comfort. Rest, ice, compress, and elevate your left wrist. Motrin and tylenol for pain. Follow up with an orthopedist for repeating xray if symptoms do not imrpove. Need to call for an appointment.

## 2016-08-16 NOTE — Progress Notes (Signed)
Orthopedic Tech Progress Note Patient Details:  Julie Montgomery [birth year] [id number]  Ortho Devices Type of Ortho Device: Arm sling, Velcro wrist forearm splint Ortho Device/Splint Location: lue Ortho Device/Splint Interventions: Ordered, Application   Karolee Stamps 08/16/2016, 4:56 AM

## 2016-08-16 NOTE — ED Provider Notes (Signed)
[place] DEPT Provider Note   CSN: [id number] Arrival date & time: 08/15/16  2253     History   Chief Complaint Chief Complaint  Patient presents with  . Fall  . Hip Pain  . Arm Pain    HPI Julie Montgomery is a 81 y.o. female.  HPI 81 year old Caucasian female with past medical history significant for osteopenia presents to the ED today with complaints of left arm and left hip pain after mechanical fall. Patient states that she was at the gym at church this evening when she tripped over a ball landing on her left arm and hitting her left hip. She has been ambulatory since the event. Patient complains of significant pain to her left wrist. Movement makes the pain worse. Nothing makes the pain better. Patient had tried any medications prior to arrival for the pain. Patient denies hitting her head or LOC. Denies any other injuries.  Past Medical History:  Diagnosis Date  . Anxiety   . Osteopenia   . Pneumonia    hx of 03/2008    Patient Active Problem List   Diagnosis Date Noted  . Knee pain, left 02/17/2016  . Carotid bruit 02/17/2016  . Preop exam for internal medicine 02/17/2016  . Stress at home 10/18/2015  . Neck pain, acute 01/26/2015  . Abrasion of arm, left 11/05/2014  . Swelling of foot joint 10/14/2014  . Clostridium difficile colitis 10/04/2014  . Diarrhea 10/04/2014  . Numbness of left hand 04/19/2014  . Dysuria 03/19/2012  . Low back pain 03/19/2012  . Lipoma of arm 01/23/2012  . Impacted cerumen of both ears 05/25/2011  . Otitis externa 05/25/2011  . URI, acute 04/05/2011  . Well adult exam 04/03/2011  . Edema 02/13/2011  . Shoulder pain, right 01/30/2011  . Bronchiectasis ([place]) 01/23/2011  . Vertigo 09/13/2010  . Allergic rhinitis 09/13/2010  . CONSTIPATION 03/22/2010  . LUMBAGO 03/22/2010  . URINARY FREQUENCY, CHRONIC 03/22/2010  . Situational mixed anxiety and depressive disorder 02/25/2008  . Cough 02/17/2008  . Generalized anxiety  disorder 02/07/2007  . INSOMNIA-SLEEP DISORDER-UNSPEC 02/07/2007  . WEIGHT LOSS, ABNORMAL 02/07/2007  . Dyslipidemia 11/21/2006  . OSTEOPENIA 11/21/2006  . ALLERGY 11/21/2006    Past Surgical History:  Procedure Laterality Date  . APPENDECTOMY    . BREAST LUMPECTOMY     right  . CESAREAN SECTION    . CHOLECYSTECTOMY    . PARTIAL HYSTERECTOMY    . ROTATOR CUFF REPAIR  2012  . teeth remmoved      OB History    No data available       Home Medications    Prior to Admission medications   Medication Sig Start Date End Date Taking? Authorizing Provider  acetaminophen (TYLENOL) 500 MG tablet Take 500 mg by mouth every 6 (six) hours as needed for mild pain.     Historical Provider, MD  Ascorbic Acid (VITAMIN C PO) Take by mouth.    Historical Provider, MD  aspirin EC 81 MG tablet Take 1 tablet (81 mg total) by mouth daily. Patient not taking: Reported on 07/30/2016 03/29/16 03/29/17  Lew Dawes V, MD  Cholecalciferol (VITAMIN D3) 1000 UNITS CAPS Take 1 capsule by mouth daily. Take total of 2 daily    Historical Provider, MD  guaiFENesin (MUCINEX) 600 MG 12 hr tablet Take by mouth 2 (two) times daily.    Historical Provider, MD  LORazepam (ATIVAN) 0.5 MG tablet Take 1 tablet (0.5 mg total) by mouth every 6 (six)  hours as needed for anxiety. Patient not taking: Reported on 07/30/2016 01/19/16   Cassandria Anger, MD  meclizine (ANTIVERT) 25 MG tablet Take 1 tablet (25 mg total) by mouth 3 (three) times daily as needed for dizziness. Patient not taking: Reported on 07/30/2016 04/05/16   Lew Dawes V, MD  ondansetron (ZOFRAN) 4 MG tablet Take 1 tablet (4 mg total) by mouth every 8 (eight) hours as needed for nausea or vomiting. Patient not taking: Reported on 07/30/2016 02/29/16   Dorothyann Peng, NP    Family History Family History  Problem Relation Age of Onset  . Diabetes Sister   . Cancer Sister     lung ca  . Coronary artery disease Brother   . Dementia Brother   .  Cancer Brother     lung ca  . Stomach cancer Paternal Grandmother     Social History Social History  Substance Use Topics  . Smoking status: Never Smoker  . Smokeless tobacco: Never Used  . Alcohol use No     Allergies   Fluoxetine hcl; Lovastatin; Mometasone furo-formoterol fum; and Sulfa antibiotics   Review of Systems Review of Systems  Constitutional: Negative for chills and fever.  Gastrointestinal: Negative for nausea and vomiting.  Musculoskeletal: Positive for arthralgias, joint swelling and myalgias. Negative for neck pain.  Neurological: Negative for dizziness, syncope, weakness, light-headedness, numbness and headaches.     Physical Exam Updated Vital Signs BP 111/85   Pulse 71   Temp 98.3 F (36.8 C) (Oral)   Resp 18   Ht 5\' 1"  (1.549 m)   Wt 46.7 kg   SpO2 96%   BMI 19.46 kg/m   Physical Exam  Constitutional: She is oriented to person, place, and time. She appears well-developed and well-nourished. No distress.  HENT:  Head: Normocephalic and atraumatic.  No hemotympanum bilaterally. No septal hematoma.  Eyes: Conjunctivae and EOM are normal. Pupils are equal, round, and reactive to light. Right eye exhibits no discharge. Left eye exhibits no discharge. No scleral icterus.  Neck: Normal range of motion. Neck supple.  No midline tenderness. No deformities or step-offs noted.  Pulmonary/Chest: No respiratory distress.  Abdominal: Soft. Bowel sounds are normal. She exhibits no distension. There is no tenderness.  Musculoskeletal:       Left wrist: She exhibits decreased range of motion (due to pain), tenderness (diatal ulnar), bony tenderness, swelling and effusion. She exhibits no crepitus, no deformity and no laceration.       Left hip: She exhibits normal range of motion, normal strength, no tenderness, no bony tenderness, no swelling, no crepitus, no deformity and no laceration.  Full range of motion left elbow without any pain. Patient able to move  all fingers of the left hand. Mild flexion and extension of the left wrist but it's painful. Radial pulses are 2+ bilaterally. Sensation intact to sharp/dull. Cap refill is normal.  Patient with no tenderness to the left hip. Pelvis is stable. No external rotation or shortening of the lower extremity. DP pulses are 2+ bilaterally. Sensation intact. Cap refill normal. Patient able to ambulate with normal gait.  No midline T-spine or L-spine tenderness. No deformities or step-offs noted.  Neurological: She is alert and oriented to person, place, and time.  Skin: Skin is warm and dry. Capillary refill takes less than 2 seconds. No pallor.  Nursing note and vitals reviewed.    ED Treatments / Results  Labs (all labs ordered are listed, but only abnormal results are displayed)  Labs Reviewed - No data to display  EKG  EKG Interpretation None       Radiology Dg Elbow Complete Left  Result Date: 08/16/2016 CLINICAL DATA:  [place] last night.  Of left elbow pain. EXAM: LEFT ELBOW - COMPLETE 3+ VIEW COMPARISON:  None. FINDINGS: Mild degenerative changes on the radial head and ulna. No evidence of acute fracture or dislocation of the left elbow. No focal bone lesion or bone destruction. No significant effusion. Soft tissues are unremarkable. IMPRESSION: Mild degenerative changes in the left elbow. No acute fractures identified. Electronically Signed   By: Lucienne Capers M.D.   On: 08/16/2016 03:23   Dg Wrist Complete Left  Result Date: 08/16/2016 CLINICAL DATA:  Initial evaluation for acute trauma, fall. EXAM: LEFT WRIST - COMPLETE 3+ VIEW COMPARISON:  None. FINDINGS: No acute fracture or dislocation. Normal distal radiocarpal and radioulnar articulations intact. Chondrocalcinosis noted. No acute displaced scaphoid fracture. No soft tissue swelling about the wrist. IMPRESSION: No acute osseous abnormality about the left wrist. Electronically Signed   By: Jeannine Boga M.D.   On: 08/16/2016  00:04   Dg Hip Unilat W Or Wo Pelvis 2-3 Views Left  Result Date: 08/16/2016 CLINICAL DATA:  [place] last night.  Left hip pain. EXAM: DG HIP (WITH OR WITHOUT PELVIS) 2-3V LEFT COMPARISON:  None. FINDINGS: Degenerative changes in the lower lumbar spine and both hips. Left hip demonstrates superior acetabular narrowing and sclerosis with hypertrophic changes on both sides of the joint. No evidence of acute fracture or dislocation. No focal bone lesion or bone destruction. SI joints and symphysis pubis are not displaced. Soft tissues are unremarkable. IMPRESSION: Degenerative changes in the left hip.  No acute bony abnormalities. Electronically Signed   By: Lucienne Capers M.D.   On: 08/16/2016 03:22    Procedures Procedures (including critical care time)  Medications Ordered in ED Medications  ondansetron (ZOFRAN-ODT) disintegrating tablet 4 mg (4 mg Oral Given 08/15/16 2318)  HYDROcodone-acetaminophen (NORCO/VICODIN) 5-325 MG per tablet 1 tablet (1 tablet Oral Given 08/15/16 2320)  ibuprofen (ADVIL,MOTRIN) tablet 400 mg (400 mg Oral Given 08/16/16 0245)     Initial Impression / Assessment and Plan / ED Course  I have reviewed the triage vital signs and the nursing notes.  Pertinent labs & imaging results that were available during my care of the patient were reviewed by me and considered in my medical decision making (see chart for details).     Patient presents to the ED with complaints of left wrist and left hip pain after mechanical fall prior to arrival. X-rays were obtained that showed no acute abnormalities. Patient denies any other injuries. Denies LOC or head injury. No focal neuro deficit. Patient is able to ambulate on left leg without any pain. Patient is concerned about her pain in her left wrist. We'll place splint and give sleeping. Encourage rice therapy at home. Have given orthopedic follow-up. She is neurovascularly intact. No signs of intra-abdominal, intrathoracic, intracranial  trauma. Have given strict return precautions. Patient verbalized understanding the plan of care. ANSWERED PRIOR TO DISCHARGE. PATIENT WAS DISCUSSED AND SEEN BY DR. Oni who is agreeable to the above plan.  Final Clinical Impressions(s) / ED Diagnoses   Final diagnoses:  Fall, initial encounter  Left wrist pain  Left hip pain    New Prescriptions Discharge Medication List as of 08/16/2016  3:45 AM       Doristine Devoid, PA-C 08/16/16 [id number]    Everlene Balls, MD 08/16/16 2035

## 2016-08-17 ENCOUNTER — Telehealth: Payer: Self-pay | Admitting: Internal Medicine

## 2016-08-17 NOTE — Telephone Encounter (Signed)
PLEASE NOTE: All timestamps contained within this report are represented as [place] Standard Time. CONFIDENTIALTY NOTICE: This fax transmission is intended only for the addressee. It contains information that is legally privileged, confidential or otherwise protected from use or disclosure. If you are not the intended recipient, you are strictly prohibited from reviewing, disclosing, copying using or disseminating any of this information or taking any action in reliance on or regarding this information. If you have received this fax in error, please notify us immediately by telephone so that we can arrange for its return to [place]. Phone: [phone], Toll-Free: [phone], Fax: [phone] Page: 1 of 1 Call Id: [id number] [facility] - Client [place] Patient Name: Julie Montgomery DOB: [birth year] Initial Comment Caller states she fell and injured her left hand and wrist. Nurse Assessment Nurse: Andria Frames, RN, Aeriel Date/Time (Eastern Time): 08/17/2016 12:43:37 PM Confirm and document reason for call. If symptomatic, describe symptoms. ---Caller states, she fell last night and injured her left hand and wrist. She went to the ED last night when the pain got really bad. They completed an x ray on her wrist, hip, and elbow. They put a splint on her left hand and arm. They told her not to take it off. She said she can't put off on it. Tylenol for pain. She is taking Tylenol for pain every 4 hours the pain gets so bad. She is wondering what to do about the splint and she is wondering if she can take Tylenol or ibuprofen. Before the 4 hours the pain is worse from the fingertip to the elbow. She isn't sure if it is to tight. Her fingers are swollen on that side. Caller states, right now her pain is a 4-5 out of 10. Does the patient have any new or worsening symptoms? ---Yes Will a triage be completed? ---Yes Related visit to physician  within the last 2 weeks? ---Yes Does the PT have any chronic conditions? (i.e. diabetes, asthma, etc.) ---No Is this a behavioral health or substance abuse call? ---No Guidelines Guideline Title Affirmed Question Affirmed Notes Splint Symptoms and Questions Symptoms from tight splint Final Disposition User [facility] Hensel, RN, Aeriel

## 2016-08-20 ENCOUNTER — Ambulatory Visit (INDEPENDENT_AMBULATORY_CARE_PROVIDER_SITE_OTHER): Payer: Medicare Other | Admitting: Internal Medicine

## 2016-08-20 ENCOUNTER — Encounter: Payer: Self-pay | Admitting: Internal Medicine

## 2016-08-20 DIAGNOSIS — S5012XD Contusion of left forearm, subsequent encounter: Secondary | ICD-10-CM

## 2016-08-20 DIAGNOSIS — S5010XA Contusion of unspecified forearm, initial encounter: Secondary | ICD-10-CM | POA: Insufficient documentation

## 2016-08-20 NOTE — Progress Notes (Signed)
Subjective:  Patient ID: Julie Montgomery, female    DOB: 02-13-1936  Age: 81 y.o. MRN: [id number]  CC: No chief complaint on file.   HPI Julie Montgomery presents for a fall at the school gym 5 d ago w/pain in the L wrist and L hip pain. X rays (-) for fx  Outpatient Medications Prior to Visit  Medication Sig Dispense Refill  . acetaminophen (TYLENOL) 500 MG tablet Take 500 mg by mouth every 6 (six) hours as needed for mild pain.     . Ascorbic Acid (VITAMIN C PO) Take by mouth.    . Cholecalciferol (VITAMIN D3) 1000 UNITS CAPS Take 1 capsule by mouth daily. Take total of 2 daily    . guaiFENesin (MUCINEX) 600 MG 12 hr tablet Take by mouth 2 (two) times daily.    Marland Kitchen aspirin EC 81 MG tablet Take 1 tablet (81 mg total) by mouth daily. (Patient not taking: Reported on 07/30/2016) 100 tablet 3  . meclizine (ANTIVERT) 25 MG tablet Take 1 tablet (25 mg total) by mouth 3 (three) times daily as needed for dizziness. (Patient not taking: Reported on 07/30/2016) 30 tablet 0  . ondansetron (ZOFRAN) 4 MG tablet Take 1 tablet (4 mg total) by mouth every 8 (eight) hours as needed for nausea or vomiting. (Patient not taking: Reported on 07/30/2016) 20 tablet 0  . LORazepam (ATIVAN) 0.5 MG tablet Take 1 tablet (0.5 mg total) by mouth every 6 (six) hours as needed for anxiety. (Patient not taking: Reported on 07/30/2016) 30 tablet 0   No facility-administered medications prior to visit.     ROS Review of Systems  Constitutional: Negative for activity change, appetite change, chills, fatigue and unexpected weight change.  HENT: Negative for congestion, mouth sores and sinus pressure.   Eyes: Negative for visual disturbance.  Respiratory: Negative for cough and chest tightness.   Gastrointestinal: Negative for abdominal pain and nausea.  Genitourinary: Negative for difficulty urinating, frequency and vaginal pain.  Musculoskeletal: Positive for arthralgias. Negative for back pain and gait problem.  Skin:  Negative for pallor and rash.  Neurological: Negative for dizziness, tremors, weakness, numbness and headaches.  Psychiatric/Behavioral: Negative for confusion and sleep disturbance.    Objective:  BP 126/68 (BP Location: Right Arm, Patient Position: Sitting, Cuff Size: Normal)   Pulse 64   Temp 98.2 F (36.8 C) (Oral)   Ht 5\' 1"  (1.549 m)   Wt 102 lb (46.3 kg)   SpO2 98%   BMI 19.27 kg/m   BP Readings from Last 3 Encounters:  08/20/16 126/68  08/16/16 111/85  07/30/16 110/82    Wt Readings from Last 3 Encounters:  08/20/16 102 lb (46.3 kg)  08/15/16 103 lb (46.7 kg)  07/30/16 103 lb (46.7 kg)    Physical Exam  Constitutional: She appears well-developed. No distress.  HENT:  Head: Normocephalic.  Right Ear: External ear normal.  Left Ear: External ear normal.  Nose: Nose normal.  Mouth/Throat: Oropharynx is clear and moist.  Eyes: Conjunctivae are normal. Pupils are equal, round, and reactive to light. Right eye exhibits no discharge. Left eye exhibits no discharge.  Neck: Normal range of motion. Neck supple. No JVD present. No tracheal deviation present. No thyromegaly present.  Cardiovascular: Normal rate, regular rhythm and normal heart sounds.   Pulmonary/Chest: No stridor. No respiratory distress. She has no wheezes.  Abdominal: Soft. Bowel sounds are normal. She exhibits no distension and no mass. There is no tenderness. There is no rebound  and no guarding.  Musculoskeletal: She exhibits tenderness. She exhibits no edema.  Lymphadenopathy:    She has no cervical adenopathy.  Neurological: She displays normal reflexes. No cranial nerve deficit. She exhibits normal muscle tone. Coordination normal.  Skin: No rash noted. No erythema.  Psychiatric: She has a normal mood and affect. Her behavior is normal. Judgment and thought content normal.  L distal forearm w/dorsal swelling/tenderness Hips OK Lab Results  Component Value Date   WBC 6.5 12/13/2015   HGB 13.5  12/13/2015   HCT 39.4 12/13/2015   PLT 241.0 12/13/2015   GLUCOSE 90 12/13/2015   CHOL 215 (H) 12/13/2015   TRIG 84.0 12/13/2015   HDL 63.50 12/13/2015   LDLDIRECT 145.7 05/12/2013   LDLCALC 134 (H) 12/13/2015   ALT 11 12/13/2015   AST 15 12/13/2015   NA 142 12/13/2015   K 4.4 12/13/2015   CL 106 12/13/2015   CREATININE 0.69 12/13/2015   BUN 18 12/13/2015   CO2 28 12/13/2015   TSH 3.64 12/13/2015    Dg Elbow Complete Left  Result Date: 08/16/2016 CLINICAL DATA:  [place] last night.  Of left elbow pain. EXAM: LEFT ELBOW - COMPLETE 3+ VIEW COMPARISON:  None. FINDINGS: Mild degenerative changes on the radial head and ulna. No evidence of acute fracture or dislocation of the left elbow. No focal bone lesion or bone destruction. No significant effusion. Soft tissues are unremarkable. IMPRESSION: Mild degenerative changes in the left elbow. No acute fractures identified. Electronically Signed   By: Lucienne Capers M.D.   On: 08/16/2016 03:23   Dg Hip Unilat W Or Wo Pelvis 2-3 Views Left  Result Date: 08/16/2016 CLINICAL DATA:  [place] last night.  Left hip pain. EXAM: DG HIP (WITH OR WITHOUT PELVIS) 2-3V LEFT COMPARISON:  None. FINDINGS: Degenerative changes in the lower lumbar spine and both hips. Left hip demonstrates superior acetabular narrowing and sclerosis with hypertrophic changes on both sides of the joint. No evidence of acute fracture or dislocation. No focal bone lesion or bone destruction. SI joints and symphysis pubis are not displaced. Soft tissues are unremarkable. IMPRESSION: Degenerative changes in the left hip.  No acute bony abnormalities. Electronically Signed   By: Lucienne Capers M.D.   On: 08/16/2016 03:22    Assessment & Plan:   There are no diagnoses linked to this encounter. I have discontinued Ms. Bonnette LORazepam. I am also having her maintain her Vitamin D3, acetaminophen, guaiFENesin, Ascorbic Acid (VITAMIN C PO), ondansetron, aspirin EC, and meclizine.  No  orders of the defined types were placed in this encounter.    Follow-up: No Follow-up on file.  Walker Kehr, MD

## 2016-08-20 NOTE — Progress Notes (Signed)
Pre visit review using our clinic review tool, if applicable. No additional management support is needed unless otherwise documented below in the visit note. 

## 2016-08-20 NOTE — Assessment & Plan Note (Signed)
Cont w/splint [facility] ref

## 2016-09-06 ENCOUNTER — Ambulatory Visit: Payer: Self-pay

## 2016-09-06 ENCOUNTER — Ambulatory Visit (INDEPENDENT_AMBULATORY_CARE_PROVIDER_SITE_OTHER): Payer: Medicare Other | Admitting: Family Medicine

## 2016-09-06 ENCOUNTER — Encounter: Payer: Self-pay | Admitting: Family Medicine

## 2016-09-06 VITALS — BP 114/56 | HR 73 | Resp 16 | Ht 62.5 in | Wt 100.2 lb

## 2016-09-06 DIAGNOSIS — M25532 Pain in left wrist: Secondary | ICD-10-CM | POA: Diagnosis not present

## 2016-09-06 DIAGNOSIS — S52692A Other fracture of lower end of left ulna, initial encounter for closed fracture: Secondary | ICD-10-CM

## 2016-09-06 DIAGNOSIS — S52202A Unspecified fracture of shaft of left ulna, initial encounter for closed fracture: Secondary | ICD-10-CM | POA: Insufficient documentation

## 2016-09-06 NOTE — Patient Instructions (Signed)
Good to see you  Wear the brace at night for 2 weeks.  Otherwise tape the wrist during the day  Ice 20 minutes 2 times daily. Usually after activity and before bed. Exercises 3 times a week but start next week.  Vitamin D continue.  Avoid heavy lifting with this hand.  pennsaid pinkie amount topically 2 times daily as needed.  See me again in 3 weeks.

## 2016-09-06 NOTE — Progress Notes (Signed)
[name] [address], [place] [address] Phone: [phone] Subjective:    I'm seeing this patient by the request  of:  Plotnikov, Evie Lacks, MD   CC: Left wrist pain  [id number]  Julie Montgomery is a 81 y.o. female coming in with complaint of left wrist pain. Patient did fall in 08/16/2016. Went to the emergency room. Patient has a past medical history first S2 perineum. Concern that she was had a fractured wrist. Was seen in the emergency room and did have an x-ray. X-ray was independently visualized by me showing no acute fracture at that time. Continues to have swelling and pain though on the ulnar aspect of the wrist. States that certain movements causes a sharp pain. Possibly slowly getting better but very slowly. Rates the severity pain is still 6 out of 10. Continues over-the-counter medications intermittently. Does help.     Past Medical History:  Diagnosis Date  . Anxiety   . Osteopenia   . Pneumonia    hx of 03/2008   Past Surgical History:  Procedure Laterality Date  . APPENDECTOMY    . BREAST LUMPECTOMY     right  . CESAREAN SECTION    . CHOLECYSTECTOMY    . PARTIAL HYSTERECTOMY    . ROTATOR CUFF REPAIR  2012  . teeth remmoved     Social History   Social History  . Marital status: Married    Spouse name: N/A  . Number of children: 4  . Years of education: N/A   Occupational History  . Substitute Teacher    Social History Main Topics  . Smoking status: Never Smoker  . Smokeless tobacco: Never Used  . Alcohol use No  . Drug use: No  . Sexual activity: Not Asked   Other Topics Concern  . None   Social History Narrative   Married, Retired, she was looking after her GGD, now in court battle   Allergies  Allergen Reactions  . Fluoxetine Hcl     REACTION: jumpy  . Lovastatin   . Mometasone Furo-Formoterol Fum     Loss of appetite, laryngitis   . Sulfa Antibiotics    Family History  Problem Relation  Age of Onset  . Diabetes Sister   . Cancer Sister        lung ca  . Coronary artery disease Brother   . Dementia Brother   . Cancer Brother        lung ca  . Stomach cancer Paternal Grandmother     Past medical history, social, surgical and family history all reviewed in electronic medical record.  No pertanent information unless stated regarding to the chief complaint.   Review of Systems:Review of systems updated and as accurate as of 09/06/16  No headache, visual changes, nausea, vomiting, diarrhea, constipation, dizziness, abdominal pain, skin rash, fevers, chills, night sweats, weight loss, swollen lymph nodes, body aches, joint swelling, muscle aches, chest pain, shortness of breath, mood changes.   Objective  Blood pressure (!) 114/56, pulse 73, resp. rate 16, height 5' 2.5" (1.588 m), weight 100 lb 4 oz (45.5 kg), SpO2 97 %. Systems examined below as of 09/06/16   General: No apparent distress alert and oriented x3 mood and affect normal, dressed appropriately.  HEENT: Pupils equal, extraocular movements intact  Respiratory: [name] speak in full sentences and does not appear short of breath  Cardiovascular: No lower extremity edema, non tender, no erythema  Skin: Warm dry intact  with no signs of infection or rash on extremities or on axial skeleton.  Abdomen: Soft nontender  Neuro: Cranial nerves II through XII are intact, neurovascularly intact in all extremities with 2+ DTRs and 2+ pulses.  Lymph: No lymphadenopathy of posterior or anterior cervical chain or axillae bilaterally.  Gait normal with good balance and coordination.  MSK:  Non tender with full range of motion and good stability and symmetric strength and tone of shoulders, elbows, , hip, knee and ankles bilaterally. Severe arthritic changes of multiple joints  Wrist: Left Mild asymmetry on the ulnar aspect of the wrist Pain with ulnar deviation Pain over the ulnar aspect of the wrists distally. Mild over the  extensor carpi ulnaris No snuffbox tenderness. No tenderness over Canal of Guyon. Strength 5/5 in all directions without pain. Negative Finkelstein, tinel's and phalens. Negative Watson's test. Contralateral wrist unremarkable  MSK US performed of: Left wrist This study was ordered, performed, and interpreted by Charlann Boxer D.O.  Wrist: Extensor carpi ulnaris tendon does have significant hypoechoic changes within the tendon sheath Distal ulnar does have an area that seems to have a large bony callus noted. No true cortical defect at this time. Mild increase in Doppler flow.  IMPRESSION:  Distal ulnar fracture with extensor carpi ulnaris subluxation      Impression and Recommendations:     This case required medical decision making of moderate complexity.      Note: This dictation was prepared with Dragon dictation along with smaller phrase technology. Any transcriptional errors that result from this process are unintentional.

## 2016-09-06 NOTE — Assessment & Plan Note (Signed)
Patient does have a very small fracture of the ulnar and noted. [name] distal radius seemed to be intact. Patient does not have any instability. Patient will be wearing a removable splint. Encourage vitamin D, given topical anti-inflammatories, discussed icing. Follow-up again in 3-4 weeks.

## 2016-09-13 ENCOUNTER — Ambulatory Visit: Payer: Self-pay | Admitting: Family Medicine

## 2016-09-27 ENCOUNTER — Ambulatory Visit (INDEPENDENT_AMBULATORY_CARE_PROVIDER_SITE_OTHER): Payer: Medicare Other | Admitting: Family Medicine

## 2016-09-27 ENCOUNTER — Encounter: Payer: Self-pay | Admitting: Family Medicine

## 2016-09-27 DIAGNOSIS — S52692D Other fracture of lower end of left ulna, subsequent encounter for closed fracture with routine healing: Secondary | ICD-10-CM | POA: Diagnosis not present

## 2016-09-27 NOTE — Assessment & Plan Note (Signed)
Seems well-healed. Only has some reactive ulnar extensor carpi ulnaris tendinitis. Patient will continue to monitor this. We discussed proper taping. If patient needs worsening symptoms or consider injection. Follow-up again in 2 months

## 2016-09-27 NOTE — Patient Instructions (Signed)
Good to see you  Julie Montgomery is your friend.  OK to drop my exercises to 2 times a week.  Avoid heavy lifting  Wear tape like I showed you when lifting things.  See me again in 4-6 weeks if not perfect

## 2016-09-27 NOTE — Progress Notes (Signed)
[facility] [address], [place] [address] Phone: [phone] Subjective:    I'm seeing this patient by the request  of:  Plotnikov, Evie Lacks, MD   CC: Left wrist pain f/u  [id number]  Julie Montgomery is a 81 y.o. female coming in with complaint of left wrist pain. Patient did fall in 08/16/2016. Patient though was found to have a small nonhealing ulnar bone fracture with an extensor carpi ulnaris subluxation. Patient was given home exercises, bracing, topical anti-inflammatories and icing regimen. Patient states Doing much better. States that she is 75-80% better. Doing the exercises and states some of him to seem to be full when sometimes seems to hurt. States that there is still some swelling but very mild. Patient states can sleep comfortably at night. Was wearing the brace regularly. Doing all the medications.     Past Medical History:  Diagnosis Date  . Anxiety   . Osteopenia   . Pneumonia    hx of 03/2008   Past Surgical History:  Procedure Laterality Date  . APPENDECTOMY    . BREAST LUMPECTOMY     right  . CESAREAN SECTION    . CHOLECYSTECTOMY    . PARTIAL HYSTERECTOMY    . ROTATOR CUFF REPAIR  2012  . teeth remmoved     Social History   Social History  . Marital status: Married    Spouse name: N/A  . Number of children: 4  . Years of education: N/A   Occupational History  . Substitute Teacher    Social History Main Topics  . Smoking status: Never Smoker  . Smokeless tobacco: Never Used  . Alcohol use No  . Drug use: No  . Sexual activity: Not Asked   Other Topics Concern  . None   Social History Narrative   Married, Retired, she was looking after her GGD, now in court battle   Allergies  Allergen Reactions  . Fluoxetine Hcl     REACTION: jumpy  . Lovastatin   . Mometasone Furo-Formoterol Fum     Loss of appetite, laryngitis   . Sulfa Antibiotics    Family History  Problem Relation Age of Onset  .  Diabetes Sister   . Cancer Sister        lung ca  . Coronary artery disease Brother   . Dementia Brother   . Cancer Brother        lung ca  . Stomach cancer Paternal Grandmother     Past medical history, social, surgical and family history all reviewed in electronic medical record.  No pertanent information unless stated regarding to the chief complaint.   Review of Systems: No headache, visual changes, nausea, vomiting, diarrhea, constipation, dizziness, abdominal pain, skin rash, fevers, chills, night sweats, weight loss, swollen lymph nodes, body aches, joint swelling, muscle aches, chest pain, shortness of breath, mood changes.    Objective  Blood pressure 108/62, pulse 70, height 5' 2.5" (1.588 m), weight 100 lb 12.8 oz (45.7 kg).   Systems examined below as of 09/27/16 General: NAD A&O x3 mood, affect normal  HEENT: Pupils equal, extraocular movements intact no nystagmus Respiratory: not short of breath at rest or with speaking Cardiovascular: No lower extremity edema, non tender Skin: Warm dry intact with no signs of infection or rash on extremities or on axial skeleton. Abdomen: Soft nontender, no masses Neuro: Cranial nerves  intact, neurovascularly intact in all extremities with 2+ DTRs and 2+ pulses. Lymph:  No lymphadenopathy appreciated today  Gait normal with good balance and coordination.  MSK: mild tender with full range of motion and good stability and symmetric strength and tone of shoulders, elbows,   knee hips and ankles bilaterally.     Severe arthritic changes of multiple joints  Wrist: Left Mild asymmetry on the ulnar aspect of the wrist improvement from previous exam Minimal discomfort with ulnar deviation . Mild over the extensor carpi ulnaris: Left with palpation No snuffbox tenderness. No tenderness over Canal of Guyon. Strength 5/5 in all directions without pain. Negative Finkelstein, tinel's and phalens. Negative Watson's test. Contralateral wrist  unremarkable      Impression and Recommendations:     This case required medical decision making of moderate complexity.      Note: This dictation was prepared with Dragon dictation along with smaller phrase technology. Any transcriptional errors that result from this process are unintentional.

## 2016-10-04 ENCOUNTER — Ambulatory Visit: Payer: Self-pay | Admitting: Psychology

## 2016-10-05 ENCOUNTER — Other Ambulatory Visit: Payer: Self-pay | Admitting: Obstetrics and Gynecology

## 2016-10-05 DIAGNOSIS — Z1231 Encounter for screening mammogram for malignant neoplasm of breast: Secondary | ICD-10-CM

## 2016-10-23 ENCOUNTER — Ambulatory Visit (INDEPENDENT_AMBULATORY_CARE_PROVIDER_SITE_OTHER): Payer: Medicare Other | Admitting: Psychology

## 2016-10-23 ENCOUNTER — Encounter: Payer: Self-pay | Admitting: Family Medicine

## 2016-10-23 ENCOUNTER — Ambulatory Visit (INDEPENDENT_AMBULATORY_CARE_PROVIDER_SITE_OTHER): Payer: Medicare Other | Admitting: Family Medicine

## 2016-10-23 ENCOUNTER — Ambulatory Visit: Payer: Self-pay

## 2016-10-23 VITALS — BP 100/66 | HR 75 | Ht 62.5 in | Wt 99.0 lb

## 2016-10-23 DIAGNOSIS — M25532 Pain in left wrist: Secondary | ICD-10-CM | POA: Diagnosis not present

## 2016-10-23 DIAGNOSIS — F4323 Adjustment disorder with mixed anxiety and depressed mood: Secondary | ICD-10-CM | POA: Diagnosis not present

## 2016-10-23 DIAGNOSIS — S52692D Other fracture of lower end of left ulna, subsequent encounter for closed fracture with routine healing: Secondary | ICD-10-CM | POA: Diagnosis not present

## 2016-10-23 NOTE — Patient Instructions (Signed)
Good to see you  Julie Montgomery is your friend.  Stay active.  Tape wrist when doing a lot of activity For your husband a soft cervical collar  See me when you need me

## 2016-10-23 NOTE — Progress Notes (Signed)
[name] [address] [place], [place] [address] Phone: [phone] Subjective:    I'm seeing this patient by the request  of:  Plotnikov, Evie Lacks, MD   CC: Left wrist pain f/u  [id number]  Julie Montgomery is a 81 y.o. female coming in with complaint of left wrist pain. Patient did fall in 08/16/2016. Patient though was found to have a small nonhealing ulnar bone fracture with an extensor carpi ulnaris subluxation. Patient is continuing to do very well with conservative therapy. Feels like she is doing 90-95% better. Patient denies any new symptoms. Patient has been able to start increasing range of motion.      Past Medical History:  Diagnosis Date  . Anxiety   . Osteopenia   . Pneumonia    hx of 03/2008   Past Surgical History:  Procedure Laterality Date  . APPENDECTOMY    . BREAST LUMPECTOMY     right  . CESAREAN SECTION    . CHOLECYSTECTOMY    . PARTIAL HYSTERECTOMY    . ROTATOR CUFF REPAIR  2012  . teeth remmoved     Social History   Social History  . Marital status: Married    Spouse name: N/A  . Number of children: 4  . Years of education: N/A   Occupational History  . Substitute Teacher    Social History Main Topics  . Smoking status: Never Smoker  . Smokeless tobacco: Never Used  . Alcohol use No  . Drug use: No  . Sexual activity: Not Asked   Other Topics Concern  . None   Social History Narrative   Married, Retired, she was looking after her GGD, now in court battle   Allergies  Allergen Reactions  . Fluoxetine Hcl     REACTION: jumpy  . Lovastatin   . Mometasone Furo-Formoterol Fum     Loss of appetite, laryngitis   . Sulfa Antibiotics    Family History  Problem Relation Age of Onset  . Diabetes Sister   . Cancer Sister        lung ca  . Coronary artery disease Brother   . Dementia Brother   . Cancer Brother        lung ca  . Stomach cancer Paternal Grandmother     Past medical history,  social, surgical and family history all reviewed in electronic medical record.  No pertanent information unless stated regarding to the chief complaint.   Review of Systems: No headache, visual changes, nausea, vomiting, diarrhea, constipation, dizziness, abdominal pain, skin rash, fevers, chills, night sweats, weight loss, swollen lymph nodes, body aches, joint swelling, muscle aches, chest pain, shortness of breath, mood changes.  .    Objective  Blood pressure 100/66, pulse 75, height 5' 2.5" (1.588 m), weight 99 lb (44.9 kg), SpO2 95 %.   Systems examined below as of 10/23/16 General: NAD A&O x3 mood, affect normal  HEENT: Pupils equal, extraocular movements intact no nystagmus Respiratory: not short of breath at rest or with speaking Cardiovascular: No lower extremity edema, non tender Skin: Warm dry intact with no signs of infection or rash on extremities or on axial skeleton. Abdomen: Soft nontender, no masses Neuro: Cranial nerves  intact, neurovascularly intact in all extremities with 2+ DTRs and 2+ pulses. Lymph: No lymphadenopathy appreciated today  Gait normal with good balance and coordination.  MSK: Non tender with full range of motion and good stability and symmetric strength and tone of  shoulders, elbows,  knee hips and ankles bilaterally.  Arthritic changes of multiple joints  Wrist:left  Inspection mild arthritic changes.  Mild limited rom.  Palpation is normal over metacarpals, navicular, lunate, and TFCC; tendons without tenderness/ swelling No snuffbox tenderness. No tenderness over Canal of Guyon. Strength 5/5 in all directions without pain. Negative Finkelstein, tinel's and phalens. Negative Watson's test.  Contralateral wrist unremarkable.   MSK US performed of: left  This study was ordered, performed, and interpreted by Charlann Boxer D.O.  Wrist: Mild hypoechoic changes still of the extensor carpi ulnaris tendon. Callus formation is good. Seems to be doing  well. Adam Phenix well healed IMPRESSION:  Healed ulnar fracture with mild extensor carpi ulnaris tendinitis     Impression and Recommendations:     This case required medical decision making of moderate complexity.      Note: This dictation was prepared with Dragon dictation along with smaller phrase technology. Any transcriptional errors that result from this process are unintentional.

## 2016-10-23 NOTE — Assessment & Plan Note (Signed)
Healed at this time. Patient in follow-up on an as-needed basis. We discussed range of motion exercises and strengthening. Declined physical therapy.

## 2016-10-30 ENCOUNTER — Ambulatory Visit (INDEPENDENT_AMBULATORY_CARE_PROVIDER_SITE_OTHER): Payer: Medicare Other | Admitting: Internal Medicine

## 2016-10-30 ENCOUNTER — Encounter: Payer: Self-pay | Admitting: Internal Medicine

## 2016-10-30 VITALS — BP 114/72 | HR 62 | Temp 98.1°F | Ht 62.5 in | Wt 98.0 lb

## 2016-10-30 DIAGNOSIS — E785 Hyperlipidemia, unspecified: Secondary | ICD-10-CM | POA: Diagnosis not present

## 2016-10-30 DIAGNOSIS — S52692D Other fracture of lower end of left ulna, subsequent encounter for closed fracture with routine healing: Secondary | ICD-10-CM | POA: Diagnosis not present

## 2016-10-30 DIAGNOSIS — F411 Generalized anxiety disorder: Secondary | ICD-10-CM

## 2016-10-30 DIAGNOSIS — J479 Bronchiectasis, uncomplicated: Secondary | ICD-10-CM

## 2016-10-30 DIAGNOSIS — R634 Abnormal weight loss: Secondary | ICD-10-CM

## 2016-10-30 NOTE — Assessment & Plan Note (Addendum)
Healing Ca and Vit D

## 2016-10-30 NOTE — Assessment & Plan Note (Signed)
No cough

## 2016-10-30 NOTE — Assessment & Plan Note (Signed)
Doing well overall Use Xanax prn

## 2016-10-30 NOTE — Patient Instructions (Signed)
MC well w/Jill 

## 2016-10-30 NOTE — Assessment & Plan Note (Signed)
Dental issues Diet discussed

## 2016-10-30 NOTE — Progress Notes (Signed)
Subjective:  Patient ID: Julie Montgomery, female    DOB: [birth year]  Age: 81 y.o. MRN: [id number]  CC: No chief complaint on file.   HPI Julie Montgomery presents for L wrist fx, wt loss due to dental issues, anxiety f/u  Outpatient Medications Prior to Visit  Medication Sig Dispense Refill  . acetaminophen (TYLENOL) 500 MG tablet Take 500 mg by mouth every 6 (six) hours as needed for mild pain.     . Ascorbic Acid (VITAMIN C PO) Take by mouth.    Marland Kitchen aspirin EC 81 MG tablet Take 1 tablet (81 mg total) by mouth daily. 100 tablet 3  . Cholecalciferol (VITAMIN D3) 1000 UNITS CAPS Take 1 capsule by mouth daily. Take total of 2 daily    . meclizine (ANTIVERT) 25 MG tablet Take 1 tablet (25 mg total) by mouth 3 (three) times daily as needed for dizziness. 30 tablet 0  . ondansetron (ZOFRAN) 4 MG tablet Take 1 tablet (4 mg total) by mouth every 8 (eight) hours as needed for nausea or vomiting. 20 tablet 0   No facility-administered medications prior to visit.     ROS Review of Systems  Constitutional: Positive for unexpected weight change. Negative for activity change, appetite change, chills and fatigue.  HENT: Positive for dental problem. Negative for congestion, mouth sores and sinus pressure.   Eyes: Negative for visual disturbance.  Respiratory: Negative for cough and chest tightness.   Gastrointestinal: Negative for abdominal pain and nausea.  Genitourinary: Negative for difficulty urinating, frequency and vaginal pain.  Musculoskeletal: Positive for arthralgias. Negative for back pain and gait problem.  Skin: Negative for pallor and rash.  Neurological: Negative for dizziness, tremors, weakness, numbness and headaches.  Psychiatric/Behavioral: Negative for confusion and sleep disturbance.    Objective:  BP 114/72 (BP Location: Left Arm, Patient Position: Sitting, Cuff Size: Normal)   Pulse 62   Temp 98.1 F (36.7 C) (Oral)   Ht 5' 2.5" (1.588 m)   Wt 98 lb (44.5 kg)   SpO2  99%   BMI 17.64 kg/m   BP Readings from Last 3 Encounters:  10/30/16 114/72  10/23/16 100/66  09/27/16 108/62    Wt Readings from Last 3 Encounters:  10/30/16 98 lb (44.5 kg)  10/23/16 99 lb (44.9 kg)  09/27/16 100 lb 12.8 oz (45.7 kg)    Physical Exam  Constitutional: She appears well-developed. No distress.  HENT:  Head: Normocephalic.  Right Ear: External ear normal.  Left Ear: External ear normal.  Nose: Nose normal.  Mouth/Throat: Oropharynx is clear and moist.  Eyes: Conjunctivae are normal. Pupils are equal, round, and reactive to light. Right eye exhibits no discharge. Left eye exhibits no discharge.  Neck: Normal range of motion. Neck supple. No JVD present. No tracheal deviation present. No thyromegaly present.  Cardiovascular: Normal rate, regular rhythm and normal heart sounds.   Pulmonary/Chest: No stridor. No respiratory distress. She has no wheezes.  Abdominal: Soft. Bowel sounds are normal. She exhibits no distension and no mass. There is no tenderness. There is no rebound and no guarding.  Musculoskeletal: She exhibits no edema or tenderness.  Lymphadenopathy:    She has no cervical adenopathy.  Neurological: She displays normal reflexes. No cranial nerve deficit. She exhibits normal muscle tone. Coordination normal.  Skin: No rash noted. No erythema.  Psychiatric: She has a normal mood and affect. Her behavior is normal. Judgment and thought content normal.  dentures  Lab Results  Component Value Date  WBC 6.5 12/13/2015   HGB 13.5 12/13/2015   HCT 39.4 12/13/2015   PLT 241.0 12/13/2015   GLUCOSE 90 12/13/2015   CHOL 215 (H) 12/13/2015   TRIG 84.0 12/13/2015   HDL 63.50 12/13/2015   LDLDIRECT 145.7 05/12/2013   LDLCALC 134 (H) 12/13/2015   ALT 11 12/13/2015   AST 15 12/13/2015   NA 142 12/13/2015   K 4.4 12/13/2015   CL 106 12/13/2015   CREATININE 0.69 12/13/2015   BUN 18 12/13/2015   CO2 28 12/13/2015   TSH 3.64 12/13/2015    Dg Elbow  Complete Left  Result Date: 08/16/2016 CLINICAL DATA:  [place] last night.  Of left elbow pain. EXAM: LEFT ELBOW - COMPLETE 3+ VIEW COMPARISON:  None. FINDINGS: Mild degenerative changes on the radial head and ulna. No evidence of acute fracture or dislocation of the left elbow. No focal bone lesion or bone destruction. No significant effusion. Soft tissues are unremarkable. IMPRESSION: Mild degenerative changes in the left elbow. No acute fractures identified. Electronically Signed   By: Lucienne Capers M.D.   On: 08/16/2016 03:23   Dg Hip Unilat W Or Wo Pelvis 2-3 Views Left  Result Date: 08/16/2016 CLINICAL DATA:  [place] last night.  Left hip pain. EXAM: DG HIP (WITH OR WITHOUT PELVIS) 2-3V LEFT COMPARISON:  None. FINDINGS: Degenerative changes in the lower lumbar spine and both hips. Left hip demonstrates superior acetabular narrowing and sclerosis with hypertrophic changes on both sides of the joint. No evidence of acute fracture or dislocation. No focal bone lesion or bone destruction. SI joints and symphysis pubis are not displaced. Soft tissues are unremarkable. IMPRESSION: Degenerative changes in the left hip.  No acute bony abnormalities. Electronically Signed   By: Lucienne Capers M.D.   On: 08/16/2016 03:22    Assessment & Plan:   There are no diagnoses linked to this encounter. I am having Ms. Kushner maintain her Vitamin D3, acetaminophen, Ascorbic Acid (VITAMIN C PO), ondansetron, aspirin EC, and meclizine.  No orders of the defined types were placed in this encounter.    Follow-up: No Follow-up on file.  Walker Kehr, MD

## 2016-11-06 ENCOUNTER — Ambulatory Visit (INDEPENDENT_AMBULATORY_CARE_PROVIDER_SITE_OTHER): Payer: Medicare Other | Admitting: Psychology

## 2016-11-06 DIAGNOSIS — Z1231 Encounter for screening mammogram for malignant neoplasm of breast: Secondary | ICD-10-CM | POA: Diagnosis not present

## 2016-11-06 DIAGNOSIS — F4323 Adjustment disorder with mixed anxiety and depressed mood: Secondary | ICD-10-CM

## 2016-11-06 DIAGNOSIS — Z13820 Encounter for screening for osteoporosis: Secondary | ICD-10-CM | POA: Diagnosis not present

## 2016-11-06 DIAGNOSIS — Z01419 Encounter for gynecological examination (general) (routine) without abnormal findings: Secondary | ICD-10-CM | POA: Diagnosis not present

## 2016-11-06 DIAGNOSIS — Z681 Body mass index (BMI) 19 or less, adult: Secondary | ICD-10-CM | POA: Diagnosis not present

## 2016-11-12 ENCOUNTER — Ambulatory Visit
Admission: RE | Admit: 2016-11-12 | Discharge: 2016-11-12 | Disposition: A | Discharge status: Home / Self Care | Payer: Medicare Other | Source: Ambulatory Visit | Attending: Obstetrics and Gynecology | Admitting: Obstetrics and Gynecology

## 2016-11-12 DIAGNOSIS — Z1231 Encounter for screening mammogram for malignant neoplasm of breast: Secondary | ICD-10-CM

## 2016-12-15 ENCOUNTER — Encounter: Payer: Self-pay | Admitting: Internal Medicine

## 2016-12-20 ENCOUNTER — Telehealth: Payer: Self-pay | Admitting: Internal Medicine

## 2016-12-20 NOTE — Telephone Encounter (Signed)
Spoke with Julie Montgomery regarding AWV. Pt stated that she will need to give office a call back to schedule appt.

## 2017-01-23 NOTE — Progress Notes (Addendum)
Subjective:   Julie Montgomery is a 81 y.o. female who presents for Medicare Annual (Subsequent) preventive examination.  Review of Systems:  No ROS.  Medicare Wellness Visit. Additional risk factors are reflected in the social history.  Cardiac Risk Factors include: advanced age (>39men, >31 women);dyslipidemia Sleep patterns: feels rested on waking, does not get up to void, gets up 1 times nightly to void and sleeps 7-8 hours nightly.    Home Safety/Smoke Alarms: Feels safe in home. Smoke alarms in place.  Living environment; residence and Firearm Safety: 2-story house, no firearms. Lives with husband, no needs for DME, good support system Seat Belt Safety/Bike Helmet: Wears seat belt.     Objective:     Vitals: BP 128/64   Pulse 62   Resp 20   Ht 5\' 3"  (1.6 m)   Wt 99 lb (44.9 kg)   SpO2 98%   BMI 17.54 kg/m   Body mass index is 17.54 kg/m.   Tobacco History  Smoking Status  . Never Smoker  Smokeless Tobacco  . Never Used     Counseling given: Not Answered   Past Medical History:  Diagnosis Date  . Anxiety   . Osteopenia   . Pneumonia    hx of 03/2008   Past Surgical History:  Procedure Laterality Date  . APPENDECTOMY    . BREAST LUMPECTOMY     right  . CESAREAN SECTION    . CHOLECYSTECTOMY    . PARTIAL HYSTERECTOMY    . ROTATOR CUFF REPAIR  2012  . teeth remmoved     Family History  Problem Relation Age of Onset  . Diabetes Sister   . Cancer Sister        lung ca  . Coronary artery disease Brother   . Dementia Brother   . Cancer Brother        lung ca  . Stomach cancer Paternal Grandmother   . Breast cancer Neg Hx    History  Sexual Activity  . Sexual activity: Not on file    Outpatient Encounter Prescriptions as of 01/24/2017  Medication Sig  . acetaminophen (TYLENOL) 500 MG tablet Take 500 mg by mouth every 6 (six) hours as needed for mild pain.   . Ascorbic Acid (VITAMIN C PO) Take by mouth.  Marland Kitchen aspirin EC 81 MG tablet Take 1  tablet (81 mg total) by mouth daily.  . Cholecalciferol (VITAMIN D3) 1000 UNITS CAPS Take 1 capsule by mouth daily. Take total of 2 daily  . meclizine (ANTIVERT) 25 MG tablet Take 1 tablet (25 mg total) by mouth 3 (three) times daily as needed for dizziness.  . [DISCONTINUED] ondansetron (ZOFRAN) 4 MG tablet Take 1 tablet (4 mg total) by mouth every 8 (eight) hours as needed for nausea or vomiting. (Patient not taking: Reported on 01/24/2017)   No facility-administered encounter medications on file as of 01/24/2017.     Activities of Daily Living In your present state of health, do you have any difficulty performing the following activities: 01/24/2017  Hearing? N  Vision? N  Difficulty concentrating or making decisions? N  Walking or climbing stairs? N  Dressing or bathing? N  Doing errands, shopping? N  Preparing Food and eating ? N  Using the Toilet? N  In the past six months, have you accidently leaked urine? N  Do you have problems with loss of bowel control? N  Managing your Medications? N  Managing your Finances? N  Housekeeping or managing your  Housekeeping? N  Some recent data might be hidden    Patient Care Team: Plotnikov, Evie Lacks, MD as PCP - General    Assessment:    Physical assessment deferred to PCP.  Exercise Activities and Dietary recommendations Current Exercise Habits: Structured exercise class, Type of exercise: walking;treadmill, Time (Minutes): 35, Frequency (Times/Week): 2, Weekly Exercise (Minutes/Week): 70, Intensity: Mild  Diet (meal preparation, eat out, water intake, caffeinated beverages, dairy products, fruits and vegetables): in general, a "healthy" diet  , well balanced, eats a variety of fruits and vegetables daily, limits salt, fat/cholesterol, sugar, caffeine, drinks 6-8 glasses of water daily.  Discussed healthy weight gain tips, Diet education was provided via handout, encouraged patient to increase daily water intake.  Goals      Patient  Stated   . patient (pt-stated)          Will see Dr. Rexene Edison to stabilize grief and constant demands of family and self care       Other   . <enter goal here>    . Increase my physical and social activity          I will go to the gym 2-3 times per week and inquire about social activities I can join at church.      Fall Risk Fall Risk  01/24/2017 01/24/2017 10/30/2016 06/14/2015 11/05/2014  Falls in the past year? Yes Yes Yes Yes Yes  Number falls in past yr: - 1 1 1 1   Injury with Fall? - No Yes Yes Yes  Risk for fall due to : - Impaired balance/gait - - -  Follow up - Falls prevention discussed - Education provided -   Depression Screen PHQ 2/9 Scores 01/24/2017 10/30/2016 06/14/2015 11/05/2014  PHQ - 2 Score 3 1 2 1   PHQ- 9 Score 4 - 7 -     Cognitive Function MMSE - Mini Mental State Exam 01/24/2017 06/14/2015  Not completed: - (No Data)  Orientation to time 5 -  Orientation to Place 5 -  Registration 3 -  Attention/ Calculation 5 -  Recall 3 -  Language- name 2 objects 2 -  Language- repeat 1 -  Language- follow 3 step command 3 -  Language- read & follow direction 1 -  Write a sentence 1 -  Copy design 1 -  Total score 30 -        Immunization History  Administered Date(s) Administered  . Hepatitis A 06/14/2005  . Hepatitis B 06/14/2005  . Influenza Split 01/22/2011, 01/23/2012  . Influenza Whole 01/29/2008, 02/17/2008, 02/08/2009  . Influenza, High Dose Seasonal PF 02/17/2013, 02/17/2016  . Influenza,inj,Quad PF,6+ Mos 01/20/2014, 12/30/2014  . PPD Test 05/25/2011  . Pneumococcal Conjugate-13 05/12/2013  . Pneumococcal Polysaccharide-23 02/02/2005, 05/25/2011  . Td 06/14/2005  . Tdap 10/18/2015  . Zoster 08/18/2007   Screening Tests Health Maintenance  Topic Date Due  . INFLUENZA VACCINE  11/28/2016  . TETANUS/TDAP  10/17/2025  . DEXA SCAN  Completed  . PNA vac Low Risk Adult  Completed      Plan:  Continue doing brain stimulating activities (puzzles,  reading, adult coloring books, staying active) to keep memory sharp.   Continue to eat heart healthy diet (full of fruits, vegetables, whole grains, lean protein, water--limit salt, fat, and sugar intake) and increase physical activity as tolerated.  I have personally reviewed and noted the following in the patient's chart:   . Medical and social history . Use of alcohol, tobacco or illicit drugs  .  Current medications and supplements . Functional ability and status . Nutritional status . Physical activity . Advanced directives . List of other physicians . Vitals . Screenings to include cognitive, depression, and falls . Referrals and appointments  In addition, I have reviewed and discussed with patient certain preventive protocols, quality metrics, and best practice recommendations. A written personalized care plan for preventive services as well as general preventive health recommendations were provided to patient.     Michiel Cowboy, RN  01/24/2017   Medical screening examination/treatment/procedure(s) were performed by non-physician practitioner and as supervising physician I was immediately available for consultation/collaboration. I agree with above. Scarlette Calico, MD

## 2017-01-23 NOTE — Progress Notes (Signed)
Pre visit review using our clinic review tool, if applicable. No additional management support is needed unless otherwise documented below in the visit note. 

## 2017-01-24 ENCOUNTER — Ambulatory Visit (INDEPENDENT_AMBULATORY_CARE_PROVIDER_SITE_OTHER): Payer: Medicare Other | Admitting: *Deleted

## 2017-01-24 VITALS — BP 128/64 | HR 62 | Resp 20 | Ht 63.0 in | Wt 99.0 lb

## 2017-01-24 DIAGNOSIS — Z0000 Encounter for general adult medical examination without abnormal findings: Secondary | ICD-10-CM

## 2017-01-24 DIAGNOSIS — Z23 Encounter for immunization: Secondary | ICD-10-CM | POA: Diagnosis not present

## 2017-01-24 NOTE — Patient Instructions (Addendum)
Continue doing brain stimulating activities (puzzles, reading, adult coloring books, staying active) to keep memory sharp.   Continue to eat heart healthy diet (full of fruits, vegetables, whole grains, lean protein, water--limit salt, fat, and sugar intake) and increase physical activity as tolerated.   [name] , Thank you for taking time to come for your Medicare Wellness Visit. I appreciate your ongoing commitment to your health goals. Please review the following plan we discussed and let me know if I can assist you in the future.   These are the goals we discussed: Goals      Patient Stated   . patient (pt-stated)          Will see Dr. Rexene Edison to stabilize grief and constant demands of family and self care       Other   . <enter goal here>    . Increase my physical and social activity          I will go to the gym 2-3 times per week and inquire about activities I can join at church.       This is a list of the screening recommended for you and due dates:  Health Maintenance  Topic Date Due  . Flu Shot  11/28/2016  . Tetanus Vaccine  10/17/2025  . DEXA scan (bone density measurement)  Completed  . Pneumonia vaccines  Completed     Influenza Virus Vaccine injection What is this medicine? INFLUENZA VIRUS VACCINE (in floo EN zuh VAHY ruhs vak SEEN) helps to reduce the risk of getting influenza also known as the flu. The vaccine only helps protect you against some strains of the flu. This medicine may be used for other purposes; ask your health care provider or pharmacist if you have questions. COMMON BRAND NAME(S): Afluria, Agriflu, Alfuria, FLUAD, Fluarix, Fluarix Quadrivalent, Flublok, Flublok Quadrivalent, FLUCELVAX, Flulaval, Fluvirin, Fluzone, Fluzone High-Dose, Fluzone Intradermal What should I tell my health care provider before I take this medicine? They need to know if you have any of these conditions: -bleeding disorder like hemophilia -fever or  infection -Guillain-Barre syndrome or other neurological problems -immune system problems -infection with the human immunodeficiency virus (HIV) or AIDS -low blood platelet counts -multiple sclerosis -an unusual or allergic reaction to influenza virus vaccine, latex, other medicines, foods, dyes, or preservatives. Different brands of vaccines contain different allergens. Some may contain latex or eggs. Talk to your doctor about your allergies to make sure that you get the right vaccine. -pregnant or trying to get pregnant -breast-feeding How should I use this medicine? This vaccine is for injection into a muscle or under the skin. It is given by a health care professional. A copy of Vaccine Information Statements will be given before each vaccination. Read this sheet carefully each time. The sheet may change frequently. Talk to your healthcare provider to see which vaccines are right for you. Some vaccines should not be used in all age groups. Overdosage: If you think you have taken too much of this medicine contact a poison control center or emergency room at once. NOTE: This medicine is only for you. Do not share this medicine with others. What if I miss a dose? This does not apply. What may interact with this medicine? -chemotherapy or radiation therapy -medicines that lower your immune system like etanercept, anakinra, infliximab, and adalimumab -medicines that treat or prevent blood clots like warfarin -phenytoin -steroid medicines like prednisone or cortisone -theophylline -vaccines This list may not describe all possible interactions.  Give your health care provider a list of all the medicines, herbs, non-prescription drugs, or dietary supplements you use. Also tell them if you smoke, drink alcohol, or use illegal drugs. Some items may interact with your medicine. What should I watch for while using this medicine? Report any side effects that do not go away within 3 days to your  doctor or health care professional. Call your health care provider if any unusual symptoms occur within 6 weeks of receiving this vaccine. You may still catch the flu, but the illness is not usually as bad. You cannot get the flu from the vaccine. The vaccine will not protect against colds or other illnesses that may cause fever. The vaccine is needed every year. What side effects may I notice from receiving this medicine? Side effects that you should report to your doctor or health care professional as soon as possible: -allergic reactions like skin rash, itching or hives, swelling of the face, lips, or tongue Side effects that usually do not require medical attention (report to your doctor or health care professional if they continue or are bothersome): -fever -headache -muscle aches and pains -pain, tenderness, redness, or swelling at the injection site -tiredness This list may not describe all possible side effects. Call your doctor for medical advice about side effects. You may report side effects to FDA at 1-800-FDA-1088. Where should I keep my medicine? The vaccine will be given by a health care professional in a clinic, pharmacy, doctor's office, or other health care setting. You will not be given vaccine doses to store at home. NOTE: This sheet is a summary. It may not cover all possible information. If you have questions about this medicine, talk to your doctor, pharmacist, or health care provider.  2018 Elsevier/Gold Standard (2014-11-05 10:07:28)

## 2017-02-13 ENCOUNTER — Ambulatory Visit: Payer: Self-pay | Admitting: Internal Medicine

## 2017-02-27 ENCOUNTER — Ambulatory Visit (INDEPENDENT_AMBULATORY_CARE_PROVIDER_SITE_OTHER): Payer: Medicare Other | Admitting: Internal Medicine

## 2017-02-27 ENCOUNTER — Encounter: Payer: Self-pay | Admitting: Internal Medicine

## 2017-02-27 DIAGNOSIS — F439 Reaction to severe stress, unspecified: Secondary | ICD-10-CM | POA: Diagnosis not present

## 2017-02-27 DIAGNOSIS — R197 Diarrhea, unspecified: Secondary | ICD-10-CM

## 2017-02-27 DIAGNOSIS — R634 Abnormal weight loss: Secondary | ICD-10-CM

## 2017-02-27 DIAGNOSIS — A0472 Enterocolitis due to Clostridium difficile, not specified as recurrent: Secondary | ICD-10-CM | POA: Diagnosis not present

## 2017-02-27 DIAGNOSIS — J479 Bronchiectasis, uncomplicated: Secondary | ICD-10-CM | POA: Diagnosis not present

## 2017-02-27 NOTE — Progress Notes (Signed)
Subjective:  Patient ID: Julie Montgomery, female    DOB: [birth year]  Age: 81 y.o. MRN: [id number]  CC: No chief complaint on file.   HPI ARLOA PRAK presents for wt loss, dental issues, diarrhea f/u. No loose stools. Temporary dentures...  Outpatient Medications Prior to Visit  Medication Sig Dispense Refill  . acetaminophen (TYLENOL) 500 MG tablet Take 500 mg by mouth every 6 (six) hours as needed for mild pain.     . Ascorbic Acid (VITAMIN C PO) Take by mouth.    Marland Kitchen aspirin EC 81 MG tablet Take 1 tablet (81 mg total) by mouth daily. 100 tablet 3  . Cholecalciferol (VITAMIN D3) 1000 UNITS CAPS Take 1 capsule by mouth daily. Take total of 2 daily    . meclizine (ANTIVERT) 25 MG tablet Take 1 tablet (25 mg total) by mouth 3 (three) times daily as needed for dizziness. 30 tablet 0   No facility-administered medications prior to visit.     ROS Review of Systems  Constitutional: Negative for activity change, appetite change, chills, fatigue and unexpected weight change.  HENT: Negative for congestion, mouth sores and sinus pressure.   Eyes: Negative for visual disturbance.  Respiratory: Negative for cough and chest tightness.   Gastrointestinal: Negative for abdominal pain and nausea.  Genitourinary: Negative for difficulty urinating, frequency and vaginal pain.  Musculoskeletal: Negative for back pain and gait problem.  Skin: Negative for pallor and rash.  Neurological: Negative for dizziness, tremors, weakness, numbness and headaches.  Psychiatric/Behavioral: Negative for confusion and sleep disturbance.    Objective:  BP 126/64 (BP Location: Left Arm, Patient Position: Sitting, Cuff Size: Normal)   Pulse 69   Temp 98.3 F (36.8 C) (Oral)   Ht 5\' 3"  (1.6 m)   Wt 100 lb (45.4 kg)   SpO2 98%   BMI 17.71 kg/m   BP Readings from Last 3 Encounters:  02/27/17 126/64  01/24/17 128/64  10/30/16 114/72    Wt Readings from Last 3 Encounters:  02/27/17 100 lb (45.4 kg)    01/24/17 99 lb (44.9 kg)  10/30/16 98 lb (44.5 kg)    Physical Exam  Constitutional: She appears well-developed. No distress.  HENT:  Head: Normocephalic.  Right Ear: External ear normal.  Left Ear: External ear normal.  Nose: Nose normal.  Mouth/Throat: Oropharynx is clear and moist.  Eyes: Pupils are equal, round, and reactive to light. Conjunctivae are normal. Right eye exhibits no discharge. Left eye exhibits no discharge.  Neck: Normal range of motion. Neck supple. No JVD present. No tracheal deviation present. No thyromegaly present.  Cardiovascular: Normal rate, regular rhythm and normal heart sounds.   Pulmonary/Chest: No stridor. No respiratory distress. She has no wheezes.  Abdominal: Soft. Bowel sounds are normal. She exhibits no distension and no mass. There is no tenderness. There is no rebound and no guarding.  Musculoskeletal: She exhibits no edema or tenderness.  Lymphadenopathy:    She has no cervical adenopathy.  Neurological: She displays normal reflexes. No cranial nerve deficit. She exhibits normal muscle tone. Coordination normal.  Skin: No rash noted. No erythema.  Psychiatric: She has a normal mood and affect. Her behavior is normal. Judgment and thought content normal.    Lab Results  Component Value Date   WBC 6.5 12/13/2015   HGB 13.5 12/13/2015   HCT 39.4 12/13/2015   PLT 241.0 12/13/2015   GLUCOSE 90 12/13/2015   CHOL 215 (H) 12/13/2015   TRIG 84.0 12/13/2015  HDL 63.50 12/13/2015   LDLDIRECT 145.7 05/12/2013   LDLCALC 134 (H) 12/13/2015   ALT 11 12/13/2015   AST 15 12/13/2015   NA 142 12/13/2015   K 4.4 12/13/2015   CL 106 12/13/2015   CREATININE 0.69 12/13/2015   BUN 18 12/13/2015   CO2 28 12/13/2015   TSH 3.64 12/13/2015    Mm Screening Breast Tomo Bilateral  Result Date: 11/12/2016 CLINICAL DATA:  Screening. EXAM: 2D DIGITAL SCREENING BILATERAL MAMMOGRAM WITH CAD AND ADJUNCT TOMO COMPARISON:  Previous exam(s). ACR Breast Density  Category c: The breast tissue is heterogeneously dense, which may obscure small masses. FINDINGS: There are no findings suspicious for malignancy. Images were processed with CAD. IMPRESSION: No mammographic evidence of malignancy. A result letter of this screening mammogram will be mailed directly to the patient. RECOMMENDATION: Screening mammogram in one year. (Code:SM-B-01Y) BI-RADS CATEGORY  1: Negative. Electronically Signed   By: Lillia Mountain M.D.   On: 11/12/2016 15:29    Assessment & Plan:   There are no diagnoses linked to this encounter. I am having Ms. Ringle maintain her Vitamin D3, acetaminophen, Ascorbic Acid (VITAMIN C PO), aspirin EC, and meclizine.  No orders of the defined types were placed in this encounter.    Follow-up: No Follow-up on file.  Walker Kehr, MD

## 2017-02-27 NOTE — Assessment & Plan Note (Signed)
Breathing is good

## 2017-02-27 NOTE — Assessment & Plan Note (Signed)
No relapse Nl stools

## 2017-02-27 NOTE — Assessment & Plan Note (Signed)
Discussed.

## 2017-02-27 NOTE — Assessment & Plan Note (Addendum)
Wt Readings from Last 3 Encounters:  02/27/17 100 lb (45.4 kg)  01/24/17 99 lb (44.9 kg)  10/30/16 98 lb (44.5 kg)   Temporary dentures

## 2017-02-27 NOTE — Assessment & Plan Note (Signed)
Resolved

## 2017-02-28 ENCOUNTER — Other Ambulatory Visit (INDEPENDENT_AMBULATORY_CARE_PROVIDER_SITE_OTHER): Payer: Medicare Other

## 2017-02-28 DIAGNOSIS — F411 Generalized anxiety disorder: Secondary | ICD-10-CM | POA: Diagnosis not present

## 2017-02-28 DIAGNOSIS — E785 Hyperlipidemia, unspecified: Secondary | ICD-10-CM | POA: Diagnosis not present

## 2017-02-28 DIAGNOSIS — J479 Bronchiectasis, uncomplicated: Secondary | ICD-10-CM

## 2017-02-28 DIAGNOSIS — R634 Abnormal weight loss: Secondary | ICD-10-CM | POA: Diagnosis not present

## 2017-02-28 LAB — CBC WITH DIFFERENTIAL/PLATELET
BASOS PCT: 0.9 % (ref 0.0–3.0)
Basophils Absolute: 0.1 10*3/uL (ref 0.0–0.1)
EOS PCT: 1.2 % (ref 0.0–5.0)
Eosinophils Absolute: 0.1 10*3/uL (ref 0.0–0.7)
HCT: 40.5 % (ref 36.0–46.0)
HEMOGLOBIN: 13.4 g/dL (ref 12.0–15.0)
LYMPHS ABS: 1.8 10*3/uL (ref 0.7–4.0)
Lymphocytes Relative: 30.3 % (ref 12.0–46.0)
MCHC: 33.1 g/dL (ref 30.0–36.0)
MCV: 96.5 fl (ref 78.0–100.0)
MONOS PCT: 7 % (ref 3.0–12.0)
Monocytes Absolute: 0.4 10*3/uL (ref 0.1–1.0)
NEUTROS PCT: 60.6 % (ref 43.0–77.0)
Neutro Abs: 3.6 10*3/uL (ref 1.4–7.7)
Platelets: 219 10*3/uL (ref 150.0–400.0)
RBC: 4.2 Mil/uL (ref 3.87–5.11)
RDW: 13 % (ref 11.5–15.5)
WBC: 5.9 10*3/uL (ref 4.0–10.5)

## 2017-02-28 LAB — HEPATIC FUNCTION PANEL
ALBUMIN: 4.1 g/dL (ref 3.5–5.2)
ALT: 14 U/L (ref 0–35)
AST: 17 U/L (ref 0–37)
Alkaline Phosphatase: 69 U/L (ref 39–117)
Bilirubin, Direct: 0.1 mg/dL (ref 0.0–0.3)
Total Bilirubin: 0.7 mg/dL (ref 0.2–1.2)
Total Protein: 6.7 g/dL (ref 6.0–8.3)

## 2017-02-28 LAB — BASIC METABOLIC PANEL
BUN: 20 mg/dL (ref 6–23)
CHLORIDE: 104 meq/L (ref 96–112)
CO2: 30 mEq/L (ref 19–32)
Calcium: 9.6 mg/dL (ref 8.4–10.5)
Creatinine, Ser: 0.65 mg/dL (ref 0.40–1.20)
GFR: 93.46 mL/min (ref >60.00)
GLUCOSE: 96 mg/dL (ref 70–99)
POTASSIUM: 4.2 meq/L (ref 3.5–5.1)
SODIUM: 138 meq/L (ref 135–145)

## 2017-02-28 LAB — URINALYSIS, ROUTINE W REFLEX MICROSCOPIC
BILIRUBIN URINE: NEGATIVE
Hgb urine dipstick: NEGATIVE
KETONES UR: NEGATIVE
NITRITE: NEGATIVE
PH: 5.5 (ref 5.0–8.0)
Specific Gravity, Urine: >=1.03 — AB (ref 1.000–1.030)
TOTAL PROTEIN, URINE-UPE24: NEGATIVE
URINE GLUCOSE: NEGATIVE
UROBILINOGEN UA: 0.2 (ref 0.0–1.0)

## 2017-02-28 LAB — LIPID PANEL
CHOLESTEROL: 209 mg/dL — AB (ref 0–200)
HDL: 60.3 mg/dL (ref >39.00)
LDL Cholesterol: 132 mg/dL — ABNORMAL HIGH (ref 0–99)
NonHDL: 148.32
TRIGLYCERIDES: 81 mg/dL (ref 0.0–149.0)
Total CHOL/HDL Ratio: 3
VLDL: 16.2 mg/dL (ref 0.0–40.0)

## 2017-02-28 LAB — TSH: TSH: 3.18 u[IU]/mL (ref 0.35–4.50)

## 2017-03-25 ENCOUNTER — Telehealth: Payer: Self-pay | Admitting: Internal Medicine

## 2017-03-25 NOTE — Telephone Encounter (Signed)
Pt called regarding her 02/28/17 labs results. She would like to know if they can be released through MyChart so that she can review them.

## 2017-03-27 NOTE — Telephone Encounter (Signed)
The results should be available to be seen if the pt is signed up for Mychart Thx

## 2017-03-29 ENCOUNTER — Encounter: Payer: Self-pay | Admitting: Internal Medicine

## 2017-04-09 ENCOUNTER — Emergency Department (HOSPITAL_COMMUNITY)
Admission: EM | Admit: 2017-04-09 | Discharge: 2017-04-09 | Disposition: A | Discharge status: Home / Self Care | Payer: No Typology Code available for payment source | Attending: Emergency Medicine | Admitting: Emergency Medicine

## 2017-04-09 ENCOUNTER — Encounter (HOSPITAL_COMMUNITY): Payer: Self-pay | Admitting: Emergency Medicine

## 2017-04-09 ENCOUNTER — Emergency Department (HOSPITAL_COMMUNITY): Payer: No Typology Code available for payment source

## 2017-04-09 DIAGNOSIS — Y929 Unspecified place or not applicable: Secondary | ICD-10-CM | POA: Diagnosis not present

## 2017-04-09 DIAGNOSIS — T148XXA Other injury of unspecified body region, initial encounter: Secondary | ICD-10-CM | POA: Diagnosis not present

## 2017-04-09 DIAGNOSIS — S3992XA Unspecified injury of lower back, initial encounter: Secondary | ICD-10-CM | POA: Diagnosis present

## 2017-04-09 DIAGNOSIS — M546 Pain in thoracic spine: Secondary | ICD-10-CM | POA: Diagnosis not present

## 2017-04-09 DIAGNOSIS — Z79899 Other long term (current) drug therapy: Secondary | ICD-10-CM | POA: Diagnosis not present

## 2017-04-09 DIAGNOSIS — Y939 Activity, unspecified: Secondary | ICD-10-CM | POA: Diagnosis not present

## 2017-04-09 DIAGNOSIS — S39012A Strain of muscle, fascia and tendon of lower back, initial encounter: Secondary | ICD-10-CM | POA: Diagnosis not present

## 2017-04-09 DIAGNOSIS — Y999 Unspecified external cause status: Secondary | ICD-10-CM | POA: Diagnosis not present

## 2017-04-09 DIAGNOSIS — M545 Low back pain: Secondary | ICD-10-CM | POA: Diagnosis not present

## 2017-04-09 MED ORDER — ACETAMINOPHEN 500 MG PO TABS
500.0000 mg | ORAL_TABLET | Freq: Once | ORAL | Status: AC | PRN
  Administered 2017-04-09: 500 mg via ORAL
  Filled 2017-04-09: qty 1

## 2017-04-09 NOTE — Discharge Instructions (Signed)
It was my pleasure taking care of you today!   Fortunately, your imaging was reassuring today.  Tylenol as needed for pain.   Follow up with your primary care provider if symptoms persist longer than 3-5 days.   Return to ER for numbness or weakness in your legs, new or worsening symptoms, any additional concerns.
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
## 2017-04-09 NOTE — ED Notes (Signed)
Declined W/C at D/C and was escorted to lobby by RN. 

## 2017-05-08 DIAGNOSIS — J310 Chronic rhinitis: Secondary | ICD-10-CM | POA: Diagnosis not present

## 2017-05-08 DIAGNOSIS — J479 Bronchiectasis, uncomplicated: Secondary | ICD-10-CM | POA: Diagnosis not present

## 2017-05-13 DIAGNOSIS — H43813 Vitreous degeneration, bilateral: Secondary | ICD-10-CM | POA: Diagnosis not present

## 2017-05-13 DIAGNOSIS — H04123 Dry eye syndrome of bilateral lacrimal glands: Secondary | ICD-10-CM | POA: Diagnosis not present

## 2017-05-13 DIAGNOSIS — Z961 Presence of intraocular lens: Secondary | ICD-10-CM | POA: Diagnosis not present

## 2017-05-13 DIAGNOSIS — H5213 Myopia, bilateral: Secondary | ICD-10-CM | POA: Diagnosis not present

## 2017-05-30 ENCOUNTER — Ambulatory Visit (INDEPENDENT_AMBULATORY_CARE_PROVIDER_SITE_OTHER): Payer: Medicare Other | Admitting: Psychology

## 2017-05-30 DIAGNOSIS — F4323 Adjustment disorder with mixed anxiety and depressed mood: Secondary | ICD-10-CM | POA: Diagnosis not present

## 2017-06-04 DIAGNOSIS — R0600 Dyspnea, unspecified: Secondary | ICD-10-CM | POA: Diagnosis not present

## 2017-06-04 DIAGNOSIS — J479 Bronchiectasis, uncomplicated: Secondary | ICD-10-CM | POA: Diagnosis not present

## 2017-06-12 ENCOUNTER — Ambulatory Visit (INDEPENDENT_AMBULATORY_CARE_PROVIDER_SITE_OTHER)
Admission: RE | Admit: 2017-06-12 | Discharge: 2017-06-12 | Disposition: A | Discharge status: Home / Self Care | Payer: Medicare Other | Source: Ambulatory Visit | Attending: Internal Medicine | Admitting: Internal Medicine

## 2017-06-12 ENCOUNTER — Ambulatory Visit (INDEPENDENT_AMBULATORY_CARE_PROVIDER_SITE_OTHER): Payer: Medicare Other | Admitting: Internal Medicine

## 2017-06-12 ENCOUNTER — Encounter: Payer: Self-pay | Admitting: Internal Medicine

## 2017-06-12 DIAGNOSIS — M25511 Pain in right shoulder: Secondary | ICD-10-CM | POA: Diagnosis not present

## 2017-06-12 DIAGNOSIS — E785 Hyperlipidemia, unspecified: Secondary | ICD-10-CM

## 2017-06-12 DIAGNOSIS — M7989 Other specified soft tissue disorders: Secondary | ICD-10-CM | POA: Diagnosis not present
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
## 2017-06-12 NOTE — Assessment & Plan Note (Signed)
Not on statins 

## 2017-06-12 NOTE — Assessment & Plan Note (Signed)
2/19 swelling of the sterno-clavicular joint on the R X ray Aleve prn

## 2017-06-25 ENCOUNTER — Ambulatory Visit (INDEPENDENT_AMBULATORY_CARE_PROVIDER_SITE_OTHER): Payer: Medicare Other | Admitting: Internal Medicine

## 2017-06-25 ENCOUNTER — Encounter: Payer: Self-pay | Admitting: Internal Medicine

## 2017-06-25 DIAGNOSIS — M25511 Pain in right shoulder: Secondary | ICD-10-CM

## 2017-06-25 DIAGNOSIS — R634 Abnormal weight loss: Secondary | ICD-10-CM | POA: Diagnosis not present

## 2017-06-25 NOTE — Progress Notes (Signed)
Subjective:  Patient ID: Julie Montgomery, female    DOB: [birth year]  Age: 82 y.o. MRN: [id number]  CC: No chief complaint on file.   HPI Julie Montgomery presents for wt loss, diarrhea f/u  Outpatient Medications Prior to Visit  Medication Sig Dispense Refill  . acetaminophen (TYLENOL) 500 MG tablet Take 500 mg by mouth every 6 (six) hours as needed for mild pain.     . Ascorbic Acid (VITAMIN C PO) Take by mouth.    . Cholecalciferol (VITAMIN D3) 1000 UNITS CAPS Take 1 capsule by mouth daily. Take total of 2 daily    . meclizine (ANTIVERT) 25 MG tablet Take 1 tablet (25 mg total) by mouth 3 (three) times daily as needed for dizziness. 30 tablet 0   No facility-administered medications prior to visit.     ROS Review of Systems  Constitutional: Negative for activity change, appetite change, chills, fatigue and unexpected weight change.  HENT: Negative for congestion, mouth sores and sinus pressure.   Eyes: Negative for visual disturbance.  Respiratory: Negative for cough and chest tightness.   Gastrointestinal: Negative for abdominal pain and nausea.  Genitourinary: Negative for difficulty urinating, frequency and vaginal pain.  Musculoskeletal: Negative for back pain and gait problem.  Skin: Negative for pallor and rash.  Neurological: Negative for dizziness, tremors, weakness, numbness and headaches.  Psychiatric/Behavioral: Negative for confusion and sleep disturbance.    Objective:  BP 124/72 (BP Location: Left Arm, Patient Position: Sitting, Cuff Size: Normal)   Pulse 63   Temp 98.1 F (36.7 C) (Oral)   Ht 5\' 3"  (1.6 m)   Wt 98 lb (44.5 kg)   SpO2 98%   BMI 17.36 kg/m   BP Readings from Last 3 Encounters:  06/25/17 124/72  06/12/17 122/68  04/09/17 130/74    Wt Readings from Last 3 Encounters:  06/25/17 98 lb (44.5 kg)  06/12/17 97 lb (44 kg)  02/27/17 100 lb (45.4 kg)    Physical Exam  Constitutional: She appears well-developed. No distress.  HENT:    Head: Normocephalic.  Right Ear: External ear normal.  Left Ear: External ear normal.  Nose: Nose normal.  Mouth/Throat: Oropharynx is clear and moist.  Eyes: Conjunctivae are normal. Pupils are equal, round, and reactive to light. Right eye exhibits no discharge. Left eye exhibits no discharge.  Neck: Normal range of motion. Neck supple. No JVD present. No tracheal deviation present. No thyromegaly present.  Cardiovascular: Normal rate, regular rhythm and normal heart sounds.  Pulmonary/Chest: No stridor. No respiratory distress. She has no wheezes.  Abdominal: Soft. Bowel sounds are normal. She exhibits no distension and no mass. There is no tenderness. There is no rebound and no guarding.  Musculoskeletal: She exhibits no edema or tenderness.  Lymphadenopathy:    She has no cervical adenopathy.  Neurological: She displays normal reflexes. No cranial nerve deficit. She exhibits normal muscle tone. Coordination normal.  Skin: No rash noted. No erythema.  Psychiatric: She has a normal mood and affect. Her behavior is normal. Judgment and thought content normal.    Lab Results  Component Value Date   WBC 5.9 02/28/2017   HGB 13.4 02/28/2017   HCT 40.5 02/28/2017   PLT 219.0 02/28/2017   GLUCOSE 96 02/28/2017   CHOL 209 (H) 02/28/2017   TRIG 81.0 02/28/2017   HDL 60.30 02/28/2017   LDLDIRECT 145.7 05/12/2013   LDLCALC 132 (H) 02/28/2017   ALT 14 02/28/2017   AST 17 02/28/2017   NA  138 02/28/2017   K 4.2 02/28/2017   CL 104 02/28/2017   CREATININE 0.65 02/28/2017   BUN 20 02/28/2017   CO2 30 02/28/2017   TSH 3.18 02/28/2017    Dg Sternum  Result Date: 06/12/2017 CLINICAL DATA:  82 year old female with right sternoclavicular pain and swelling for 1 week. No known injury. EXAM: STERNUM - 2+ VIEW COMPARISON:  Chest radiographs 03/23/2014.  Chest CT 12/15/2010. FINDINGS: Chronic large lung volumes. Stable cardiac size and mediastinal contours. Chronic bilateral pulmonary  interstitial opacity. Stable small right midlung granuloma. No pneumothorax, pulmonary edema, pleural effusion or acute pulmonary opacity identified. Stable radiographic appearance of the sternum and anterior clear space on the lateral view. No sternoclavicular abnormality is evident. IMPRESSION: 1. No radiographic sternoclavicular abnormality. 2. Chronic lung disease.  No acute cardiopulmonary abnormality. Electronically Signed   By: Genevie Ann M.D.   On: 06/12/2017 23:23    Assessment & Plan:   There are no diagnoses linked to this encounter. I am having Julie Montgomery maintain her Vitamin D3, acetaminophen, Ascorbic Acid (VITAMIN C PO), and meclizine.  No orders of the defined types were placed in this encounter.    Follow-up: No Follow-up on file.  Walker Kehr, MD

## 2017-06-25 NOTE — Assessment & Plan Note (Signed)
Better  Aleve prn

## 2017-06-25 NOTE — Assessment & Plan Note (Signed)
Dentures - ok No diarrhea Better

## 2017-07-04 ENCOUNTER — Encounter: Payer: Self-pay | Admitting: Internal Medicine

## 2017-07-19 ENCOUNTER — Ambulatory Visit (INDEPENDENT_AMBULATORY_CARE_PROVIDER_SITE_OTHER): Payer: Medicare Other | Admitting: Psychology

## 2017-07-19 DIAGNOSIS — F4323 Adjustment disorder with mixed anxiety and depressed mood: Secondary | ICD-10-CM | POA: Diagnosis not present

## 2017-08-20 ENCOUNTER — Telehealth: Payer: Self-pay | Admitting: Internal Medicine

## 2017-08-20 NOTE — Telephone Encounter (Signed)
Colon cancer screening is no longer indicated.

## 2017-08-20 NOTE — Telephone Encounter (Signed)
Last Cologurad: 7/ 16/ 2015 LOV: 2/ 26/ 2019 Next appt: Not Sch'ed Would you like to reorder cologuard?

## 2017-08-29 ENCOUNTER — Encounter: Payer: Self-pay | Admitting: Internal Medicine

## 2017-08-29 ENCOUNTER — Ambulatory Visit (INDEPENDENT_AMBULATORY_CARE_PROVIDER_SITE_OTHER): Payer: Medicare Other | Admitting: Internal Medicine

## 2017-08-29 DIAGNOSIS — F4323 Adjustment disorder with mixed anxiety and depressed mood: Secondary | ICD-10-CM | POA: Diagnosis not present

## 2017-08-29 MED ORDER — LORAZEPAM 0.5 MG PO TABS: 0.5000 mg | ORAL_TABLET | Freq: Two times a day (BID) | ORAL | 1 refills | Status: DC | PRN

## 2017-08-29 NOTE — Patient Instructions (Signed)
Valerian root for anxiety 

## 2017-08-29 NOTE — Assessment & Plan Note (Signed)
Anxiety is worse. [place] will see Dr Rexene Edison
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
## 2017-08-30 ENCOUNTER — Ambulatory Visit (INDEPENDENT_AMBULATORY_CARE_PROVIDER_SITE_OTHER): Payer: Medicare Other | Admitting: Psychology

## 2017-08-30 DIAGNOSIS — Z85828 Personal history of other malignant neoplasm of skin: Secondary | ICD-10-CM | POA: Diagnosis not present

## 2017-08-30 DIAGNOSIS — L820 Inflamed seborrheic keratosis: Secondary | ICD-10-CM | POA: Diagnosis not present

## 2017-08-30 DIAGNOSIS — L918 Other hypertrophic disorders of the skin: Secondary | ICD-10-CM | POA: Diagnosis not present

## 2017-08-30 DIAGNOSIS — F4323 Adjustment disorder with mixed anxiety and depressed mood: Secondary | ICD-10-CM | POA: Diagnosis not present

## 2017-09-13 ENCOUNTER — Ambulatory Visit: Payer: Self-pay | Admitting: Internal Medicine

## 2017-09-17 ENCOUNTER — Ambulatory Visit: Payer: Self-pay | Admitting: Internal Medicine

## 2017-10-22 ENCOUNTER — Ambulatory Visit (INDEPENDENT_AMBULATORY_CARE_PROVIDER_SITE_OTHER): Payer: Medicare Other | Admitting: Psychology

## 2017-10-22 DIAGNOSIS — F4323 Adjustment disorder with mixed anxiety and depressed mood: Secondary | ICD-10-CM

## 2017-11-01 ENCOUNTER — Ambulatory Visit (INDEPENDENT_AMBULATORY_CARE_PROVIDER_SITE_OTHER): Payer: Medicare Other | Admitting: Internal Medicine

## 2017-11-01 ENCOUNTER — Encounter: Payer: Self-pay | Admitting: Internal Medicine

## 2017-11-01 DIAGNOSIS — R634 Abnormal weight loss: Secondary | ICD-10-CM

## 2017-11-01 DIAGNOSIS — F411 Generalized anxiety disorder: Secondary | ICD-10-CM | POA: Diagnosis not present

## 2017-11-01 MED ORDER — MIRTAZAPINE 30 MG PO TBDP: ORAL_TABLET | ORAL | 6 refills | Status: DC
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
## 2017-11-01 NOTE — Progress Notes (Signed)
Subjective:  Patient ID: Julie Montgomery, female    DOB: Jun 13, 1935  Age: 82 y.o. MRN: [id number]  CC: No chief complaint on file.   HPI ALANNAH AVERHART presents for wt loss, fatigue, anxiety f/u Having new dentures done Outpatient Medications Prior to Visit  Medication Sig Dispense Refill  . acetaminophen (TYLENOL) 500 MG tablet Take 500 mg by mouth every 6 (six) hours as needed for mild pain.     . Ascorbic Acid (VITAMIN C PO) Take by mouth.    . Cholecalciferol (VITAMIN D3) 1000 UNITS CAPS Take 1 capsule by mouth daily. Take total of 2 daily    . LORazepam (ATIVAN) 0.5 MG tablet Take 1 tablet (0.5 mg total) by mouth 2 (two) times daily as needed for anxiety. 30 tablet 1  . meclizine (ANTIVERT) 25 MG tablet Take 1 tablet (25 mg total) by mouth 3 (three) times daily as needed for dizziness. 30 tablet 0   No facility-administered medications prior to visit.     ROS: Review of Systems  Constitutional: Positive for fatigue and unexpected weight change. Negative for activity change, appetite change and chills.  HENT: Negative for congestion, mouth sores and sinus pressure.   Eyes: Negative for visual disturbance.  Respiratory: Negative for cough and chest tightness.   Gastrointestinal: Negative for abdominal pain and nausea.  Genitourinary: Negative for difficulty urinating, frequency and vaginal pain.  Musculoskeletal: Negative for back pain and gait problem.  Skin: Negative for pallor and rash.  Neurological: Negative for dizziness, tremors, weakness, numbness and headaches.  Psychiatric/Behavioral: Positive for dysphoric mood. Negative for confusion, sleep disturbance and suicidal ideas. The patient is nervous/anxious.     Objective:  BP (!) 128/58 (BP Location: Left Arm, Patient Position: Sitting, Cuff Size: Normal)   Pulse 67   Temp 98.5 F (36.9 C) (Oral)   Ht 5\' 3"  (1.6 m)   Wt 96 lb 4 oz (43.7 kg)   SpO2 97%   BMI 17.05 kg/m   BP Readings from Last 3 Encounters:    11/01/17 (!) 128/58  08/29/17 108/60  06/25/17 124/72    Wt Readings from Last 3 Encounters:  11/01/17 96 lb 4 oz (43.7 kg)  08/29/17 98 lb (44.5 kg)  06/25/17 98 lb (44.5 kg)    Physical Exam  Constitutional: She appears well-developed. No distress.  HENT:  Head: Normocephalic.  Right Ear: External ear normal.  Left Ear: External ear normal.  Nose: Nose normal.  Mouth/Throat: Oropharynx is clear and moist.  Eyes: Pupils are equal, round, and reactive to light. Conjunctivae are normal. Right eye exhibits no discharge. Left eye exhibits no discharge.  Neck: Normal range of motion. Neck supple. No JVD present. No tracheal deviation present. No thyromegaly present.  Cardiovascular: Normal rate, regular rhythm and normal heart sounds.  Pulmonary/Chest: No stridor. No respiratory distress. She has no wheezes.  Abdominal: Soft. Bowel sounds are normal. She exhibits no distension and no mass. There is no tenderness. There is no rebound and no guarding.  Musculoskeletal: She exhibits no edema or tenderness.  Lymphadenopathy:    She has no cervical adenopathy.  Neurological: She displays normal reflexes. No cranial nerve deficit. She exhibits normal muscle tone. Coordination normal.  Skin: No rash noted. No erythema.  Psychiatric: Her behavior is normal. Judgment and thought content normal.    Lab Results  Component Value Date   WBC 5.9 02/28/2017   HGB 13.4 02/28/2017   HCT 40.5 02/28/2017   PLT 219.0 02/28/2017  GLUCOSE 96 02/28/2017   CHOL 209 (H) 02/28/2017   TRIG 81.0 02/28/2017   HDL 60.30 02/28/2017   LDLDIRECT 145.7 05/12/2013   LDLCALC 132 (H) 02/28/2017   ALT 14 02/28/2017   AST 17 02/28/2017   NA 138 02/28/2017   K 4.2 02/28/2017   CL 104 02/28/2017   CREATININE 0.65 02/28/2017   BUN 20 02/28/2017   CO2 30 02/28/2017   TSH 3.18 02/28/2017    Dg Sternum  Result Date: 06/12/2017 CLINICAL DATA:  82 year old female with right sternoclavicular pain and  swelling for 1 week. No known injury. EXAM: STERNUM - 2+ VIEW COMPARISON:  Chest radiographs 03/23/2014.  Chest CT 12/15/2010. FINDINGS: Chronic large lung volumes. Stable cardiac size and mediastinal contours. Chronic bilateral pulmonary interstitial opacity. Stable small right midlung granuloma. No pneumothorax, pulmonary edema, pleural effusion or acute pulmonary opacity identified. Stable radiographic appearance of the sternum and anterior clear space on the lateral view. No sternoclavicular abnormality is evident. IMPRESSION: 1. No radiographic sternoclavicular abnormality. 2. Chronic lung disease.  No acute cardiopulmonary abnormality. Electronically Signed   By: Genevie Ann M.D.   On: 06/12/2017 23:23    Assessment & Plan:   There are no diagnoses linked to this encounter.   No orders of the defined types were placed in this encounter.    Follow-up: No follow-ups on file.  Walker Kehr, MD

## 2017-11-01 NOTE — Assessment & Plan Note (Addendum)
Discussed Megace  Having new dentures done   Start Remeron

## 2017-11-20 ENCOUNTER — Ambulatory Visit (INDEPENDENT_AMBULATORY_CARE_PROVIDER_SITE_OTHER): Payer: Medicare Other | Admitting: Psychology

## 2017-11-20 DIAGNOSIS — F4323 Adjustment disorder with mixed anxiety and depressed mood: Secondary | ICD-10-CM | POA: Diagnosis not present

## 2017-12-12 ENCOUNTER — Encounter: Payer: Self-pay | Admitting: Internal Medicine

## 2017-12-12 ENCOUNTER — Ambulatory Visit (INDEPENDENT_AMBULATORY_CARE_PROVIDER_SITE_OTHER): Payer: Medicare Other | Admitting: Internal Medicine

## 2017-12-12 DIAGNOSIS — F439 Reaction to severe stress, unspecified: Secondary | ICD-10-CM | POA: Diagnosis not present

## 2017-12-12 DIAGNOSIS — R634 Abnormal weight loss: Secondary | ICD-10-CM

## 2017-12-12 DIAGNOSIS — F411 Generalized anxiety disorder: Secondary | ICD-10-CM

## 2017-12-12 DIAGNOSIS — F4323 Adjustment disorder with mixed anxiety and depressed mood: Secondary | ICD-10-CM

## 2017-12-12 NOTE — Assessment & Plan Note (Signed)
Discussed.

## 2017-12-12 NOTE — Assessment & Plan Note (Signed)
Dental issues discussed Wt Readings from Last 3 Encounters:  12/12/17 95 lb (43.1 kg)  11/01/17 96 lb 4 oz (43.7 kg)  08/29/17 98 lb (44.5 kg)

## 2017-12-12 NOTE — Progress Notes (Signed)
Subjective:  Patient ID: Julie Montgomery, female    DOB: 1935-11-06  Age: 82 y.o. MRN: [id number]  CC: No chief complaint on file.   HPI AHAVA KISSOON presents for wt loss, anxiety, depression f/u  Outpatient Medications Prior to Visit  Medication Sig Dispense Refill  . acetaminophen (TYLENOL) 500 MG tablet Take 500 mg by mouth every 6 (six) hours as needed for mild pain.     . Ascorbic Acid (VITAMIN C PO) Take by mouth.    . Cholecalciferol (VITAMIN D3) 1000 UNITS CAPS Take 1 capsule by mouth daily. Take total of 2 daily    . LORazepam (ATIVAN) 0.5 MG tablet Take 1 tablet (0.5 mg total) by mouth 2 (two) times daily as needed for anxiety. 30 tablet 1  . meclizine (ANTIVERT) 25 MG tablet Take 1 tablet (25 mg total) by mouth 3 (three) times daily as needed for dizziness. 30 tablet 0  . mirtazapine (REMERON SOL-TAB) 30 MG disintegrating tablet 1 po 30-60 min before dinner 30 tablet 6   No facility-administered medications prior to visit.     ROS: Review of Systems  Constitutional: Positive for unexpected weight change. Negative for activity change, appetite change, chills and fatigue.  HENT: Negative for congestion, mouth sores and sinus pressure.   Eyes: Negative for visual disturbance.  Respiratory: Negative for cough and chest tightness.   Gastrointestinal: Negative for abdominal pain and nausea.  Genitourinary: Negative for difficulty urinating, frequency and vaginal pain.  Musculoskeletal: Negative for back pain and gait problem.  Skin: Negative for pallor and rash.  Neurological: Negative for dizziness, tremors, weakness, numbness and headaches.  Psychiatric/Behavioral: Negative for confusion, sleep disturbance and suicidal ideas. The patient is nervous/anxious.     Objective:  BP 124/62 (BP Location: Left Arm, Patient Position: Sitting, Cuff Size: Normal)   Pulse 66   Temp 97.7 F (36.5 C) (Oral)   Ht 5\' 3"  (1.6 m)   Wt 95 lb (43.1 kg)   SpO2 96%   BMI 16.83 kg/m     BP Readings from Last 3 Encounters:  12/12/17 124/62  11/01/17 (!) 128/58  08/29/17 108/60    Wt Readings from Last 3 Encounters:  12/12/17 95 lb (43.1 kg)  11/01/17 96 lb 4 oz (43.7 kg)  08/29/17 98 lb (44.5 kg)    Physical Exam  Constitutional: She appears well-developed. No distress.  HENT:  Head: Normocephalic.  Right Ear: External ear normal.  Left Ear: External ear normal.  Nose: Nose normal.  Mouth/Throat: Oropharynx is clear and moist.  Eyes: Pupils are equal, round, and reactive to light. Conjunctivae are normal. Right eye exhibits no discharge. Left eye exhibits no discharge.  Neck: Normal range of motion. Neck supple. No JVD present. No tracheal deviation present. No thyromegaly present.  Cardiovascular: Normal rate, regular rhythm and normal heart sounds.  Pulmonary/Chest: No stridor. No respiratory distress. She has no wheezes.  Abdominal: Soft. Bowel sounds are normal. She exhibits no distension and no mass. There is no tenderness. There is no rebound and no guarding.  Musculoskeletal: She exhibits no edema or tenderness.  Lymphadenopathy:    She has no cervical adenopathy.  Neurological: She displays normal reflexes. No cranial nerve deficit. She exhibits normal muscle tone. Coordination normal.  Skin: No rash noted. No erythema.  Psychiatric: She has a normal mood and affect. Her behavior is normal. Judgment and thought content normal.   FTF >20 min discussing wt loss, stress  Lab Results  Component Value Date  WBC 5.9 02/28/2017   HGB 13.4 02/28/2017   HCT 40.5 02/28/2017   PLT 219.0 02/28/2017   GLUCOSE 96 02/28/2017   CHOL 209 (H) 02/28/2017   TRIG 81.0 02/28/2017   HDL 60.30 02/28/2017   LDLDIRECT 145.7 05/12/2013   LDLCALC 132 (H) 02/28/2017   ALT 14 02/28/2017   AST 17 02/28/2017   NA 138 02/28/2017   K 4.2 02/28/2017   CL 104 02/28/2017   CREATININE 0.65 02/28/2017   BUN 20 02/28/2017   CO2 30 02/28/2017   TSH 3.18 02/28/2017    Dg  Sternum  Result Date: 06/12/2017 CLINICAL DATA:  82 year old female with right sternoclavicular pain and swelling for 1 week. No known injury. EXAM: STERNUM - 2+ VIEW COMPARISON:  Chest radiographs 03/23/2014.  Chest CT 12/15/2010. FINDINGS: Chronic large lung volumes. Stable cardiac size and mediastinal contours. Chronic bilateral pulmonary interstitial opacity. Stable small right midlung granuloma. No pneumothorax, pulmonary edema, pleural effusion or acute pulmonary opacity identified. Stable radiographic appearance of the sternum and anterior clear space on the lateral view. No sternoclavicular abnormality is evident. IMPRESSION: 1. No radiographic sternoclavicular abnormality. 2. Chronic lung disease.  No acute cardiopulmonary abnormality. Electronically Signed   By: Genevie Ann M.D.   On: 06/12/2017 23:23    Assessment & Plan:   There are no diagnoses linked to this encounter.   No orders of the defined types were placed in this encounter.    Follow-up: No follow-ups on file.  Walker Kehr, MD

## 2017-12-16 DIAGNOSIS — M810 Age-related osteoporosis without current pathological fracture: Secondary | ICD-10-CM | POA: Diagnosis not present

## 2017-12-16 DIAGNOSIS — Z01419 Encounter for gynecological examination (general) (routine) without abnormal findings: Secondary | ICD-10-CM | POA: Diagnosis not present

## 2017-12-16 DIAGNOSIS — Z1231 Encounter for screening mammogram for malignant neoplasm of breast: Secondary | ICD-10-CM | POA: Diagnosis not present

## 2017-12-16 DIAGNOSIS — M8589 Other specified disorders of bone density and structure, multiple sites: Secondary | ICD-10-CM | POA: Diagnosis not present

## 2017-12-16 DIAGNOSIS — Z1211 Encounter for screening for malignant neoplasm of colon: Secondary | ICD-10-CM | POA: Diagnosis not present

## 2017-12-16 DIAGNOSIS — Z13820 Encounter for screening for osteoporosis: Secondary | ICD-10-CM | POA: Diagnosis not present

## 2017-12-16 DIAGNOSIS — Z681 Body mass index (BMI) 19 or less, adult: Secondary | ICD-10-CM | POA: Diagnosis not present

## 2017-12-31 ENCOUNTER — Ambulatory Visit (INDEPENDENT_AMBULATORY_CARE_PROVIDER_SITE_OTHER): Payer: Medicare Other | Admitting: Psychology

## 2017-12-31 DIAGNOSIS — F4323 Adjustment disorder with mixed anxiety and depressed mood: Secondary | ICD-10-CM

## 2018-01-09 DIAGNOSIS — M810 Age-related osteoporosis without current pathological fracture: Secondary | ICD-10-CM | POA: Diagnosis not present

## 2018-01-29 ENCOUNTER — Ambulatory Visit (INDEPENDENT_AMBULATORY_CARE_PROVIDER_SITE_OTHER): Payer: Medicare Other

## 2018-01-29 DIAGNOSIS — Z23 Encounter for immunization: Secondary | ICD-10-CM

## 2018-02-05 ENCOUNTER — Ambulatory Visit (INDEPENDENT_AMBULATORY_CARE_PROVIDER_SITE_OTHER): Payer: Medicare Other | Admitting: Psychology

## 2018-02-05 DIAGNOSIS — F4323 Adjustment disorder with mixed anxiety and depressed mood: Secondary | ICD-10-CM | POA: Diagnosis not present

## 2018-02-20 ENCOUNTER — Other Ambulatory Visit: Payer: Self-pay | Admitting: Internal Medicine

## 2018-02-20 MED ORDER — BUSPIRONE HCL 15 MG PO TABS: ORAL_TABLET | ORAL | 5 refills | Status: DC

## 2018-02-28 ENCOUNTER — Ambulatory Visit (INDEPENDENT_AMBULATORY_CARE_PROVIDER_SITE_OTHER): Payer: Medicare Other | Admitting: Psychology

## 2018-02-28 DIAGNOSIS — F4323 Adjustment disorder with mixed anxiety and depressed mood: Secondary | ICD-10-CM | POA: Diagnosis not present

## 2018-03-11 DIAGNOSIS — M810 Age-related osteoporosis without current pathological fracture: Secondary | ICD-10-CM | POA: Diagnosis not present

## 2018-03-17 ENCOUNTER — Other Ambulatory Visit: Payer: Self-pay

## 2018-03-20 ENCOUNTER — Ambulatory Visit (INDEPENDENT_AMBULATORY_CARE_PROVIDER_SITE_OTHER): Payer: Medicare Other | Admitting: Internal Medicine

## 2018-03-20 ENCOUNTER — Encounter: Payer: Self-pay | Admitting: Internal Medicine

## 2018-03-20 DIAGNOSIS — F4323 Adjustment disorder with mixed anxiety and depressed mood: Secondary | ICD-10-CM

## 2018-03-20 DIAGNOSIS — E785 Hyperlipidemia, unspecified: Secondary | ICD-10-CM

## 2018-03-20 DIAGNOSIS — R634 Abnormal weight loss: Secondary | ICD-10-CM

## 2018-03-20 NOTE — Assessment & Plan Note (Signed)
Stable

## 2018-03-20 NOTE — Assessment & Plan Note (Signed)
CT Ca score test option was offered

## 2018-03-20 NOTE — Progress Notes (Signed)
Subjective:  Patient ID: Julie Montgomery, female    DOB: 10/12/1935  Age: 82 y.o. MRN: [id number]  CC: No chief complaint on file.   HPI LEAN JAEGER presents for anxiety, wt loss, stress, dyslipidemia f/u  Outpatient Medications Prior to Visit  Medication Sig Dispense Refill  . Cholecalciferol (VITAMIN D3) 1000 UNITS CAPS Take 1 capsule by mouth daily. Take total of 2 daily    . acetaminophen (TYLENOL) 500 MG tablet Take 500 mg by mouth every 6 (six) hours as needed for mild pain.     . Ascorbic Acid (VITAMIN C PO) Take by mouth.    . busPIRone (BUSPAR) 15 MG tablet Take 1/3 or 1/2 tablet bid for anxiety (Patient not taking: Reported on 03/20/2018) 60 tablet 5  . LORazepam (ATIVAN) 0.5 MG tablet Take 1 tablet (0.5 mg total) by mouth 2 (two) times daily as needed for anxiety. (Patient not taking: Reported on 03/20/2018) 30 tablet 1   No facility-administered medications prior to visit.     ROS: Review of Systems  Constitutional: Negative for activity change, appetite change, chills, fatigue and unexpected weight change.  HENT: Negative for congestion, mouth sores and sinus pressure.   Eyes: Negative for visual disturbance.  Respiratory: Negative for cough and chest tightness.   Gastrointestinal: Negative for abdominal pain and nausea.  Genitourinary: Negative for difficulty urinating, frequency and vaginal pain.  Musculoskeletal: Negative for back pain and gait problem.  Skin: Negative for pallor and rash.  Neurological: Negative for dizziness, tremors, weakness, numbness and headaches.  Psychiatric/Behavioral: Negative for confusion, sleep disturbance and suicidal ideas. The patient is nervous/anxious.     Objective:  BP 114/62 (BP Location: Left Arm, Patient Position: Sitting, Cuff Size: Normal)   Pulse 64   Temp 97.9 F (36.6 C) (Oral)   Ht 5\' 3"  (1.6 m)   Wt 95 lb (43.1 kg)   SpO2 98%   BMI 16.83 kg/m   BP Readings from Last 3 Encounters:  03/20/18 114/62    12/12/17 124/62  11/01/17 (!) 128/58    Wt Readings from Last 3 Encounters:  03/20/18 95 lb (43.1 kg)  12/12/17 95 lb (43.1 kg)  11/01/17 96 lb 4 oz (43.7 kg)    Physical Exam  Constitutional: She appears well-developed. No distress.  HENT:  Head: Normocephalic.  Right Ear: External ear normal.  Left Ear: External ear normal.  Nose: Nose normal.  Mouth/Throat: Oropharynx is clear and moist.  Eyes: Pupils are equal, round, and reactive to light. Conjunctivae are normal. Right eye exhibits no discharge. Left eye exhibits no discharge.  Neck: Normal range of motion. Neck supple. No JVD present. No tracheal deviation present. No thyromegaly present.  Cardiovascular: Normal rate, regular rhythm and normal heart sounds.  Pulmonary/Chest: No stridor. No respiratory distress. She has no wheezes.  Abdominal: Soft. Bowel sounds are normal. She exhibits no distension and no mass. There is no tenderness. There is no rebound and no guarding.  Musculoskeletal: She exhibits no edema or tenderness.  Lymphadenopathy:    She has no cervical adenopathy.  Neurological: She displays normal reflexes. No cranial nerve deficit. She exhibits normal muscle tone. Coordination normal.  Skin: No rash noted. No erythema.  Psychiatric: She has a normal mood and affect. Her behavior is normal. Judgment and thought content normal.  thin  FTF>20 min - discussed CT ca scoring  Lab Results  Component Value Date   WBC 5.9 02/28/2017   HGB 13.4 02/28/2017   HCT 40.5 02/28/2017  PLT 219.0 02/28/2017   GLUCOSE 96 02/28/2017   CHOL 209 (H) 02/28/2017   TRIG 81.0 02/28/2017   HDL 60.30 02/28/2017   LDLDIRECT 145.7 05/12/2013   LDLCALC 132 (H) 02/28/2017   ALT 14 02/28/2017   AST 17 02/28/2017   NA 138 02/28/2017   K 4.2 02/28/2017   CL 104 02/28/2017   CREATININE 0.65 02/28/2017   BUN 20 02/28/2017   CO2 30 02/28/2017   TSH 3.18 02/28/2017    Dg Sternum  Result Date: 06/12/2017 CLINICAL DATA:   82 year old female with right sternoclavicular pain and swelling for 1 week. No known injury. EXAM: STERNUM - 2+ VIEW COMPARISON:  Chest radiographs 03/23/2014.  Chest CT 12/15/2010. FINDINGS: Chronic large lung volumes. Stable cardiac size and mediastinal contours. Chronic bilateral pulmonary interstitial opacity. Stable small right midlung granuloma. No pneumothorax, pulmonary edema, pleural effusion or acute pulmonary opacity identified. Stable radiographic appearance of the sternum and anterior clear space on the lateral view. No sternoclavicular abnormality is evident. IMPRESSION: 1. No radiographic sternoclavicular abnormality. 2. Chronic lung disease.  No acute cardiopulmonary abnormality. Electronically Signed   By: Genevie Ann M.D.   On: 06/12/2017 23:23    Assessment & Plan:   There are no diagnoses linked to this encounter.   No orders of the defined types were placed in this encounter.    Follow-up: No follow-ups on file.  Walker Kehr, MD

## 2018-03-20 NOTE — Assessment & Plan Note (Signed)
Discussed  Doing better

## 2018-03-20 NOTE — Patient Instructions (Signed)

## 2018-04-07 ENCOUNTER — Other Ambulatory Visit: Payer: Self-pay | Admitting: Internal Medicine

## 2018-04-07 DIAGNOSIS — R42 Dizziness and giddiness: Secondary | ICD-10-CM

## 2018-04-07 MED ORDER — MECLIZINE HCL 12.5 MG PO TABS: 12.5000 mg | ORAL_TABLET | Freq: Three times a day (TID) | ORAL | 1 refills | Status: DC | PRN

## 2018-04-08 ENCOUNTER — Ambulatory Visit (INDEPENDENT_AMBULATORY_CARE_PROVIDER_SITE_OTHER): Payer: Medicare Other | Admitting: Psychology

## 2018-04-08 DIAGNOSIS — F4323 Adjustment disorder with mixed anxiety and depressed mood: Secondary | ICD-10-CM | POA: Diagnosis not present

## 2018-04-11 ENCOUNTER — Ambulatory Visit (INDEPENDENT_AMBULATORY_CARE_PROVIDER_SITE_OTHER)
Admission: RE | Admit: 2018-04-11 | Discharge: 2018-04-11 | Disposition: A | Discharge status: Home / Self Care | Payer: Self-pay | Source: Ambulatory Visit | Attending: Internal Medicine | Admitting: Internal Medicine

## 2018-04-11 DIAGNOSIS — E785 Hyperlipidemia, unspecified: Secondary | ICD-10-CM

## 2018-04-30 DIAGNOSIS — D126 Benign neoplasm of colon, unspecified: Secondary | ICD-10-CM

## 2018-04-30 HISTORY — DX: Benign neoplasm of colon, unspecified: D12.6

## 2018-05-13 ENCOUNTER — Encounter: Payer: Self-pay | Admitting: Family

## 2018-05-13 ENCOUNTER — Ambulatory Visit (INDEPENDENT_AMBULATORY_CARE_PROVIDER_SITE_OTHER): Payer: Medicare Other | Admitting: Family

## 2018-05-13 ENCOUNTER — Ambulatory Visit (INDEPENDENT_AMBULATORY_CARE_PROVIDER_SITE_OTHER)
Admission: RE | Admit: 2018-05-13 | Discharge: 2018-05-13 | Disposition: A | Discharge status: Home / Self Care | Payer: Medicare Other | Source: Ambulatory Visit | Attending: Family | Admitting: Family

## 2018-05-13 ENCOUNTER — Other Ambulatory Visit: Payer: Self-pay

## 2018-05-13 VITALS — BP 128/68 | HR 83 | Temp 97.9°F | Ht 63.0 in | Wt 93.6 lb

## 2018-05-13 DIAGNOSIS — R059 Cough, unspecified: Secondary | ICD-10-CM

## 2018-05-13 DIAGNOSIS — R05 Cough: Secondary | ICD-10-CM

## 2018-05-13 MED ORDER — FLUTICASONE PROPIONATE 50 MCG/ACT NA SUSP: 2.0000 | Freq: Every day | NASAL | 6 refills | Status: DC

## 2018-05-13 MED ORDER — BENZONATATE 100 MG PO CAPS: 100.0000 mg | ORAL_CAPSULE | Freq: Three times a day (TID) | ORAL | 0 refills | Status: DC | PRN

## 2018-05-13 MED ORDER — AZITHROMYCIN 250 MG PO TABS: ORAL_TABLET | ORAL | 0 refills | Status: DC
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
## 2018-05-13 NOTE — Patient Instructions (Signed)
You can try Debrox OTC Ear wax removal kit- both ears;  

## 2018-06-11 ENCOUNTER — Ambulatory Visit (INDEPENDENT_AMBULATORY_CARE_PROVIDER_SITE_OTHER): Payer: Medicare Other | Admitting: Internal Medicine

## 2018-06-11 ENCOUNTER — Encounter: Payer: Self-pay | Admitting: Internal Medicine

## 2018-06-11 VITALS — BP 124/62 | HR 67 | Temp 98.0°F | Ht 63.0 in | Wt 93.0 lb

## 2018-06-11 DIAGNOSIS — I2510 Atherosclerotic heart disease of native coronary artery without angina pectoris: Secondary | ICD-10-CM | POA: Diagnosis not present

## 2018-06-11 DIAGNOSIS — E785 Hyperlipidemia, unspecified: Secondary | ICD-10-CM | POA: Diagnosis not present

## 2018-06-11 DIAGNOSIS — J479 Bronchiectasis, uncomplicated: Secondary | ICD-10-CM

## 2018-06-11 DIAGNOSIS — F439 Reaction to severe stress, unspecified: Secondary | ICD-10-CM

## 2018-06-11 NOTE — Assessment & Plan Note (Signed)
Declined statins Coronary calcium score of 16 - 2020

## 2018-06-11 NOTE — Assessment & Plan Note (Signed)
No relapse 

## 2018-06-11 NOTE — Progress Notes (Signed)
Subjective:  Patient ID: Julie Montgomery, female    DOB: 1935-05-13  Age: 83 y.o. MRN: [id number]  CC: No chief complaint on file.   HPI Julie Montgomery presents for anxiety, wt loss, stress f/u  Outpatient Medications Prior to Visit  Medication Sig Dispense Refill  . acetaminophen (TYLENOL) 500 MG tablet Take 500 mg by mouth every 6 (six) hours as needed for mild pain.     . Ascorbic Acid (VITAMIN C PO) Take by mouth.    Marland Kitchen aspirin (ASPIRIN 81) 81 MG EC tablet Take 81 mg by mouth daily. Swallow whole.    . busPIRone (BUSPAR) 15 MG tablet Take 1/3 or 1/2 tablet bid for anxiety 60 tablet 5  . Cholecalciferol (VITAMIN D3) 1000 UNITS CAPS Take 1 capsule by mouth daily. Take total of 2 daily    . fluticasone (FLONASE) 50 MCG/ACT nasal spray Place 2 sprays into both nostrils daily. 16 g 6  . LORazepam (ATIVAN) 0.5 MG tablet Take 1 tablet (0.5 mg total) by mouth 2 (two) times daily as needed for anxiety. 30 tablet 1  . meclizine (ANTIVERT) 12.5 MG tablet Take 1 tablet (12.5 mg total) by mouth 3 (three) times daily as needed for dizziness. 60 tablet 1  . azithromycin (ZITHROMAX) 250 MG tablet 2 tabs po qd x 1 day; 1 tablet per day x 4 days; 6 tablet 0  . benzonatate (TESSALON) 100 MG capsule Take 1 capsule (100 mg total) by mouth 3 (three) times daily as needed. 20 capsule 0   No facility-administered medications prior to visit.     ROS: Review of Systems  Constitutional: Negative for activity change, appetite change, chills, fatigue and unexpected weight change.  HENT: Negative for congestion, mouth sores and sinus pressure.   Eyes: Negative for visual disturbance.  Respiratory: Negative for cough and chest tightness.   Gastrointestinal: Negative for abdominal pain and nausea.  Genitourinary: Negative for difficulty urinating, frequency and vaginal pain.  Musculoskeletal: Negative for back pain and gait problem.  Skin: Negative for pallor and rash.  Neurological: Negative for  dizziness, tremors, weakness, numbness and headaches.  Psychiatric/Behavioral: Negative for confusion and sleep disturbance. The patient is nervous/anxious.     Objective:  BP 124/62 (BP Location: Left Arm, Patient Position: Sitting, Cuff Size: Normal)   Pulse 67   Temp 98 F (36.7 C) (Oral)   Ht 5\' 3"  (1.6 m)   Wt 93 lb (42.2 kg)   SpO2 95%   BMI 16.47 kg/m   BP Readings from Last 3 Encounters:  06/11/18 124/62  05/13/18 128/68  03/20/18 114/62    Wt Readings from Last 3 Encounters:  06/11/18 93 lb (42.2 kg)  05/13/18 93 lb 9.6 oz (42.5 kg)  03/20/18 95 lb (43.1 kg)    Physical Exam Constitutional:      General: She is not in acute distress.    Appearance: She is well-developed.  HENT:     Head: Normocephalic.     Right Ear: External ear normal.     Left Ear: External ear normal.     Nose: Nose normal.  Eyes:     General:        Right eye: No discharge.        Left eye: No discharge.     Conjunctiva/sclera: Conjunctivae normal.     Pupils: Pupils are equal, round, and reactive to light.  Neck:     Musculoskeletal: Normal range of motion and neck supple.  Thyroid: No thyromegaly.     Vascular: No JVD.     Trachea: No tracheal deviation.  Cardiovascular:     Rate and Rhythm: Normal rate and regular rhythm.     Heart sounds: Normal heart sounds.  Pulmonary:     Effort: No respiratory distress.     Breath sounds: No stridor. No wheezing.  Abdominal:     General: Bowel sounds are normal. There is no distension.     Palpations: Abdomen is soft. There is no mass.     Tenderness: There is no abdominal tenderness. There is no guarding or rebound.  Musculoskeletal:        General: No tenderness.  Lymphadenopathy:     Cervical: No cervical adenopathy.  Skin:    Findings: No erythema or rash.  Neurological:     Cranial Nerves: No cranial nerve deficit.     Motor: No abnormal muscle tone.     Coordination: Coordination normal.     Deep Tendon Reflexes:  Reflexes normal.  Psychiatric:        Behavior: Behavior normal.        Thought Content: Thought content normal.        Judgment: Judgment normal.     Lab Results  Component Value Date   WBC 5.9 02/28/2017   HGB 13.4 02/28/2017   HCT 40.5 02/28/2017   PLT 219.0 02/28/2017   GLUCOSE 96 02/28/2017   CHOL 209 (H) 02/28/2017   TRIG 81.0 02/28/2017   HDL 60.30 02/28/2017   LDLDIRECT 145.7 05/12/2013   LDLCALC 132 (H) 02/28/2017   ALT 14 02/28/2017   AST 17 02/28/2017   NA 138 02/28/2017   K 4.2 02/28/2017   CL 104 02/28/2017   CREATININE 0.65 02/28/2017   BUN 20 02/28/2017   CO2 30 02/28/2017   TSH 3.18 02/28/2017    Dg Chest 2 View  Result Date: 05/13/2018 CLINICAL DATA:  Cough and congestion for several days EXAM: CHEST - 2 VIEW COMPARISON:  06/12/2017 FINDINGS: Cardiac shadow is stable. Aortic calcifications are again seen. The lungs are hyperinflated with patchy right basilar infiltrate laterally. Mild interstitial changes are again seen and stable. No acute bony abnormality is noted. IMPRESSION: Acute on chronic right basilar infiltrate laterally. Electronically Signed   By: Inez Catalina M.D.   On: 05/13/2018 19:45    Assessment & Plan:   There are no diagnoses linked to this encounter.   No orders of the defined types were placed in this encounter.    Follow-up: No follow-ups on file.  Walker Kehr, MD

## 2018-06-11 NOTE — Patient Instructions (Addendum)
  The extent of CAD is graded according to your calcium score:  Calcium Score  Presence of CAD  0 No evidence of CAD   1-10 Minimal evidence of CAD  11-100 Mild evidence of CAD  101-400 Moderate evidence of CAD  Over 400 Extensive evidence of CAD   Your score is 16  Coronary calcium score of 16 -- 2020  Red Rice Yeast extract to try for cholesterol  Valerian root for anxiety   If you have medicare related insurance (such as traditional Medicare, Blue [employer], [employer], or similar), Please make an appointment at the scheduling desk with Sharee Pimple, the [employer], for your Wellness visit in this office, which is a benefit with your insurance.

## 2018-06-11 NOTE — Assessment & Plan Note (Signed)
Discussed Valerian root

## 2018-06-11 NOTE — Assessment & Plan Note (Signed)
Coronary calcium score of 16 Pt declined statins Red Rice Yeast extract to try

## 2018-06-24 NOTE — Progress Notes (Addendum)
Subjective:   Julie Montgomery is a 83 y.o. female who presents for Medicare Annual (Subsequent) preventive examination.  Review of Systems:  No ROS.  Medicare Wellness Visit. Additional risk factors are reflected in the social history.  Cardiac Risk Factors include: advanced age (>74men, >85 women);dyslipidemia Sleep patterns: gets up 1 times nightly to void and sleeps 6-7 hours nightly.    Home Safety/Smoke Alarms: Feels safe in home. Smoke alarms in place.  Living environment; residence and Firearm Safety: 1-story house/ trailer. Lives with husband, no needs for DME, good support system Seat Belt Safety/Bike Helmet: Wears seat belt.     Objective:     Vitals: BP 126/70   Pulse 73   Resp 16   Ht 5\' 3"  (1.6 m)   Wt 93 lb (42.2 kg)   SpO2 100%   BMI 16.47 kg/m   Body mass index is 16.47 kg/m.  Advanced Directives 06/25/2018 01/24/2017 08/15/2016 06/14/2015 09/29/2014  Does Patient Have a Medical Advance Directive? No No No No No  Would patient like information on creating a medical advance directive? Yes (ED - Information included in AVS) No - Patient declined - Yes - Educational materials given No - patient declined information    Tobacco Social History   Tobacco Use  Smoking Status Never Smoker  Smokeless Tobacco Never Used     Counseling given: Not Answered  Past Medical History:  Diagnosis Date  . Anxiety   . Osteopenia   . Pneumonia    hx of 03/2008   Past Surgical History:  Procedure Laterality Date  . APPENDECTOMY    . BREAST LUMPECTOMY     right  . CESAREAN SECTION    . CHOLECYSTECTOMY    . PARTIAL HYSTERECTOMY    . ROTATOR CUFF REPAIR  2012  . teeth remmoved     Family History  Problem Relation Age of Onset  . Diabetes Sister   . Cancer Sister        lung ca  . Coronary artery disease Brother   . Dementia Brother   . Cancer Brother        lung ca  . Stomach cancer Paternal Grandmother   . Breast cancer Neg Hx    Social History    Socioeconomic History  . Marital status: Married    Spouse name: Not on file  . Number of children: 4  . Years of education: Not on file  . Highest education level: Not on file  Occupational History  . Occupation: Oceanographer  Social Needs  . Financial resource strain: Not hard at all  . Food insecurity:    Worry: Never true    Inability: Never true  . Transportation needs:    Medical: No    Non-medical: No  Tobacco Use  . Smoking status: Never Smoker  . Smokeless tobacco: Never Used  Substance and Sexual Activity  . Alcohol use: No  . Drug use: No  . Sexual activity: Not Currently  Lifestyle  . Physical activity:    Days per week: 1 day    Minutes per session: 30 min  . Stress: To some extent  Relationships  . Social connections:    Talks on phone: More than three times a week    Gets together: More than three times a week    Attends religious service: More than 4 times per year    Active member of club or organization: Yes    Attends meetings of clubs or organizations:  More than 4 times per year    Relationship status: Married  Other Topics Concern  . Not on file  Social History Narrative   Married, Retired, she was looking after her GGD, now in court battle    Outpatient Encounter Medications as of 06/25/2018  Medication Sig  . acetaminophen (TYLENOL) 500 MG tablet Take 500 mg by mouth every 6 (six) hours as needed for mild pain.   . Ascorbic Acid (VITAMIN C PO) Take by mouth.  Marland Kitchen aspirin (ASPIRIN 81) 81 MG EC tablet Take 81 mg by mouth daily. Swallow whole.  . busPIRone (BUSPAR) 15 MG tablet Take 1/3 or 1/2 tablet bid for anxiety  . Cholecalciferol (VITAMIN D3) 1000 UNITS CAPS Take 1 capsule by mouth daily. Take total of 2 daily  . fluticasone (FLONASE) 50 MCG/ACT nasal spray Place 2 sprays into both nostrils daily.  Marland Kitchen LORazepam (ATIVAN) 0.5 MG tablet Take 1 tablet (0.5 mg total) by mouth 2 (two) times daily as needed for anxiety.  . meclizine  (ANTIVERT) 12.5 MG tablet Take 1 tablet (12.5 mg total) by mouth 3 (three) times daily as needed for dizziness.  . psyllium (REGULOID) 0.52 g capsule Take 0.52 g by mouth daily.   No facility-administered encounter medications on file as of 06/25/2018.     Activities of Daily Living In your present state of health, do you have any difficulty performing the following activities: 06/25/2018  Hearing? N  Vision? N  Difficulty concentrating or making decisions? N  Walking or climbing stairs? N  Dressing or bathing? N  Doing errands, shopping? N  Preparing Food and eating ? N  Using the Toilet? N  In the past six months, have you accidently leaked urine? N  Do you have problems with loss of bowel control? N  Managing your Medications? N  Managing your Finances? N  Housekeeping or managing your Housekeeping? N  Some recent data might be hidden    Patient Care Team: Plotnikov, Evie Lacks, MD as PCP - General Ladene Artist, MD as Consulting Physician (Gastroenterology)    Assessment:   This is a routine wellness examination for Julie Montgomery. Physical assessment deferred to PCP.   Exercise Activities and Dietary recommendations Current Exercise Habits: Home exercise routine, Type of exercise: walking, Time (Minutes): 40, Frequency (Times/Week): 1, Weekly Exercise (Minutes/Week): 40, Intensity: Mild  Diet (meal preparation, eat out, water intake, caffeinated beverages, dairy products, fruits and vegetables): in general, a "healthy" diet  , well balanced    Discussed weight loss.  Discussed supplementing with Ensure, samples and coupons provided. Reviewed diet items that would help patient to gain weight. Encouraged patient to increase daily water and healthy fluid intake.  Goals      Patient Stated   . patient (pt-stated)     Will see Dr. Rexene Edison to stabilize grief and constant demands of family and self care       Other   . <enter goal here>    . Increase my physical and social  activity     I will go to the gym 2-3 times per week and inquire about social activities I can join at church.       Fall Risk Fall Risk  06/25/2018 03/20/2018 03/17/2018 01/24/2017 01/24/2017  Falls in the past year? 0 0 0 Yes Yes  Comment - - Emmi Telephone Survey: data to providers prior to load - -  Number falls in past yr: 0 - - - 1  Injury with Fall? - - - -  No  Risk for fall due to : History of fall(s) - - - Impaired balance/gait  Follow up Falls prevention discussed Falls evaluation completed - - Falls prevention discussed   Depression Screen PHQ 2/9 Scores 06/25/2018 03/20/2018 01/24/2017 10/30/2016  PHQ - 2 Score 2 0 3 1  PHQ- 9 Score 4 0 4 -     Cognitive Function MMSE - Mini Mental State Exam 01/24/2017 06/14/2015  Not completed: - (No Data)  Orientation to time 5 -  Orientation to Place 5 -  Registration 3 -  Attention/ Calculation 5 -  Recall 3 -  Language- name 2 objects 2 -  Language- repeat 1 -  Language- follow 3 step command 3 -  Language- read & follow direction 1 -  Write a sentence 1 -  Copy design 1 -  Total score 30 -       Ad8 score reviewed for issues:  Issues making decisions: no  Less interest in hobbies / activities: no  Repeats questions, stories (family complaining): no  Trouble using ordinary gadgets (microwave, computer, phone):no  Forgets the month or year: no  Mismanaging finances: no  Remembering appts: no  Daily problems with thinking and/or memory: no Ad8 score is= 0  Immunization History  Administered Date(s) Administered  . Hepatitis A 06/14/2005  . Hepatitis B 06/14/2005  . Influenza Split 01/22/2011, 01/23/2012  . Influenza Whole 01/29/2008, 02/17/2008, 02/08/2009  . Influenza, High Dose Seasonal PF 02/17/2013, 02/17/2016, 01/24/2017, 01/29/2018  . Influenza,inj,Quad PF,6+ Mos 01/20/2014, 12/30/2014  . PPD Test 05/25/2011  . Pneumococcal Conjugate-13 05/12/2013  . Pneumococcal Polysaccharide-23 02/02/2005, 05/25/2011   . Td 06/14/2005  . Tdap 10/18/2015  . Zoster 08/18/2007   Screening Tests Health Maintenance  Topic Date Due  . TETANUS/TDAP  10/17/2025  . INFLUENZA VACCINE  Completed  . DEXA SCAN  Completed  . PNA vac Low Risk Adult  Completed      Plan:      Reviewed health maintenance screenings with patient today and relevant education, vaccines, and/or referrals were provided.   Continue doing brain stimulating activities (puzzles, reading, adult coloring books, staying active) to keep memory sharp.   Continue to eat heart healthy diet (full of fruits, vegetables, whole grains, lean protein, water--limit salt, fat, and sugar intake) and increase physical activity as tolerated.  I have personally reviewed and noted the following in the patient's chart:   . Medical and social history . Use of alcohol, tobacco or illicit drugs  . Current medications and supplements . Functional ability and status . Nutritional status . Physical activity . Advanced directives . List of other physicians . Vitals . Screenings to include cognitive, depression, and falls . Referrals and appointments  In addition, I have reviewed and discussed with patient certain preventive protocols, quality metrics, and best practice recommendations. A written personalized care plan for preventive services as well as general preventive health recommendations were provided to patient.     Michiel Cowboy, RN  06/25/2018  Medical screening examination/treatment/procedure(s) were performed by non-physician practitioner and as supervising physician I was immediately available for consultation/collaboration. I agree with above. Lew Dawes, MD

## 2018-06-25 ENCOUNTER — Ambulatory Visit (INDEPENDENT_AMBULATORY_CARE_PROVIDER_SITE_OTHER): Payer: Medicare Other | Admitting: *Deleted

## 2018-06-25 VITALS — BP 126/70 | HR 73 | Resp 16 | Ht 63.0 in | Wt 93.0 lb

## 2018-06-25 DIAGNOSIS — Z0000 Encounter for general adult medical examination without abnormal findings: Secondary | ICD-10-CM

## 2018-06-25 NOTE — Patient Instructions (Addendum)
Continue doing brain stimulating activities (puzzles, reading, adult coloring books, staying active) to keep memory sharp.   Continue to eat heart healthy diet (full of fruits, vegetables, whole grains, lean protein, water--limit salt, fat, and sugar intake) and increase physical activity as tolerated.  Health Maintenance, Female Adopting a healthy lifestyle and getting preventive care can go a long way to promote health and wellness. Talk with your health care provider about what schedule of regular examinations is right for you. This is a good chance for you to check in with your provider about disease prevention and staying healthy. In between checkups, there are plenty of things you can do on your own. Experts have done a lot of research about which lifestyle changes and preventive measures are most likely to keep you healthy. Ask your health care provider for more information. Weight and diet Eat a healthy diet  Be sure to include plenty of vegetables, fruits, low-fat dairy products, and lean protein.  Do not eat a lot of foods high in solid fats, added sugars, or salt.  Get regular exercise. This is one of the most important things you can do for your health. ? Most adults should exercise for at least 150 minutes each week. The exercise should increase your heart rate and make you sweat (moderate-intensity exercise). ? Most adults should also do strengthening exercises at least twice a week. This is in addition to the moderate-intensity exercise. Maintain a healthy weight  Body mass index (BMI) is a measurement that can be used to identify possible weight problems. It estimates body fat based on height and weight. Your health care provider can help determine your BMI and help you achieve or maintain a healthy weight.  For females 20 years of age and older: ? A BMI below 18.5 is considered underweight. ? A BMI of 18.5 to 24.9 is normal. ? A BMI of 25 to 29.9 is considered  overweight. ? A BMI of 30 and above is considered obese. Watch levels of cholesterol and blood lipids  You should start having your blood tested for lipids and cholesterol at 81 years of age, then have this test every 5 years.  You may need to have your cholesterol levels checked more often if: ? Your lipid or cholesterol levels are high. ? You are older than 81 years of age. ? You are at high risk for heart disease. Cancer screening Lung Cancer  Lung cancer screening is recommended for adults 55-80 years old who are at high risk for lung cancer because of a history of smoking.  A yearly low-dose CT scan of the lungs is recommended for people who: ? Currently smoke. ? Have quit within the past 15 years. ? Have at least a 30-pack-year history of smoking. A pack year is smoking an average of one pack of cigarettes a day for 1 year.  Yearly screening should continue until it has been 15 years since you quit.  Yearly screening should stop if you develop a health problem that would prevent you from having lung cancer treatment. Breast Cancer  Practice breast self-awareness. This means understanding how your breasts normally appear and feel.  It also means doing regular breast self-exams. Let your health care provider know about any changes, no matter how small.  If you are in your 20s or 30s, you should have a clinical breast exam (CBE) by a health care provider every 1-3 years as part of a regular health exam.  If you are 40 or   older, have a CBE every year. Also consider having a breast X-ray (mammogram) every year.  If you have a family history of breast cancer, talk to your health care provider about genetic screening.  If you are at high risk for breast cancer, talk to your health care provider about having an MRI and a mammogram every year.  Breast cancer gene (BRCA) assessment is recommended for women who have family members with BRCA-related cancers. BRCA-related cancers  include: ? Breast. ? Ovarian. ? Tubal. ? Peritoneal cancers.  Results of the assessment will determine the need for genetic counseling and BRCA1 and BRCA2 testing. Cervical Cancer Your health care provider may recommend that you be screened regularly for cancer of the pelvic organs (ovaries, uterus, and vagina). This screening involves a pelvic examination, including checking for microscopic changes to the surface of your cervix (Pap test). You may be encouraged to have this screening done every 3 years, beginning at age 21.  For women ages 30-65, health care providers may recommend pelvic exams and Pap testing every 3 years, or they may recommend the Pap and pelvic exam, combined with testing for human papilloma virus (HPV), every 5 years. Some types of HPV increase your risk of cervical cancer. Testing for HPV may also be done on women of any age with unclear Pap test results.  Other health care providers may not recommend any screening for nonpregnant women who are considered low risk for pelvic cancer and who do not have symptoms. Ask your health care provider if a screening pelvic exam is right for you.  If you have had past treatment for cervical cancer or a condition that could lead to cancer, you need Pap tests and screening for cancer for at least 20 years after your treatment. If Pap tests have been discontinued, your risk factors (such as having a new sexual partner) need to be reassessed to determine if screening should resume. Some women have medical problems that increase the chance of getting cervical cancer. In these cases, your health care provider may recommend more frequent screening and Pap tests. Colorectal Cancer  This type of cancer can be detected and often prevented.  Routine colorectal cancer screening usually begins at 81 years of age and continues through 81 years of age.  Your health care provider may recommend screening at an earlier age if you have risk factors for  colon cancer.  Your health care provider may also recommend using home test kits to check for hidden blood in the stool.  A small camera at the end of a tube can be used to examine your colon directly (sigmoidoscopy or colonoscopy). This is done to check for the earliest forms of colorectal cancer.  Routine screening usually begins at age 50.  Direct examination of the colon should be repeated every 5-10 years through 81 years of age. However, you may need to be screened more often if early forms of precancerous polyps or small growths are found. Skin Cancer  Check your skin from head to toe regularly.  Tell your health care provider about any new moles or changes in moles, especially if there is a change in a mole's shape or color.  Also tell your health care provider if you have a mole that is larger than the size of a pencil eraser.  Always use sunscreen. Apply sunscreen liberally and repeatedly throughout the day.  Protect yourself by wearing long sleeves, pants, a wide-brimmed hat, and sunglasses whenever you are outside. Heart disease, diabetes,   and high blood pressure  High blood pressure causes heart disease and increases the risk of stroke. High blood pressure is more likely to develop in: ? People who have blood pressure in the high end of the normal range (130-139/85-89 mm Hg). ? People who are overweight or obese. ? People who are African American.  If you are 18-39 years of age, have your blood pressure checked every 3-5 years. If you are 40 years of age or older, have your blood pressure checked every year. You should have your blood pressure measured twice-once when you are at a hospital or clinic, and once when you are not at a hospital or clinic. Record the average of the two measurements. To check your blood pressure when you are not at a hospital or clinic, you can use: ? An automated blood pressure machine at a pharmacy. ? A home blood pressure monitor.  If you are  between 55 years and 79 years old, ask your health care provider if you should take aspirin to prevent strokes.  Have regular diabetes screenings. This involves taking a blood sample to check your fasting blood sugar level. ? If you are at a normal weight and have a low risk for diabetes, have this test once every three years after 81 years of age. ? If you are overweight and have a high risk for diabetes, consider being tested at a younger age or more often. Preventing infection Hepatitis B  If you have a higher risk for hepatitis B, you should be screened for this virus. You are considered at high risk for hepatitis B if: ? You were born in a country where hepatitis B is common. Ask your health care provider which countries are considered high risk. ? Your parents were born in a high-risk country, and you have not been immunized against hepatitis B (hepatitis B vaccine). ? You have HIV or AIDS. ? You use needles to inject street drugs. ? You live with someone who has hepatitis B. ? You have had sex with someone who has hepatitis B. ? You get hemodialysis treatment. ? You take certain medicines for conditions, including cancer, organ transplantation, and autoimmune conditions. Hepatitis C  Blood testing is recommended for: ? Everyone born from 1945 through 1965. ? Anyone with known risk factors for hepatitis C. Sexually transmitted infections (STIs)  You should be screened for sexually transmitted infections (STIs) including gonorrhea and chlamydia if: ? You are sexually active and are younger than 81 years of age. ? You are older than 81 years of age and your health care provider tells you that you are at risk for this type of infection. ? Your sexual activity has changed since you were last screened and you are at an increased risk for chlamydia or gonorrhea. Ask your health care provider if you are at risk.  If you do not have HIV, but are at risk, it may be recommended that you take  a prescription medicine daily to prevent HIV infection. This is called pre-exposure prophylaxis (PrEP). You are considered at risk if: ? You are sexually active and do not regularly use condoms or know the HIV status of your partner(s). ? You take drugs by injection. ? You are sexually active with a partner who has HIV. Talk with your health care provider about whether you are at high risk of being infected with HIV. If you choose to begin PrEP, you should first be tested for HIV. You should then be tested every   3 months for as long as you are taking PrEP. Pregnancy  If you are premenopausal and you may become pregnant, ask your health care provider about preconception counseling.  If you may become pregnant, take 400 to 800 micrograms (mcg) of folic acid every day.  If you want to prevent pregnancy, talk to your health care provider about birth control (contraception). Osteoporosis and menopause  Osteoporosis is a disease in which the bones lose minerals and strength with aging. This can result in serious bone fractures. Your risk for osteoporosis can be identified using a bone density scan.  If you are 65 years of age or older, or if you are at risk for osteoporosis and fractures, ask your health care provider if you should be screened.  Ask your health care provider whether you should take a calcium or vitamin D supplement to lower your risk for osteoporosis.  Menopause may have certain physical symptoms and risks.  Hormone replacement therapy may reduce some of these symptoms and risks. Talk to your health care provider about whether hormone replacement therapy is right for you. Follow these instructions at home:  Schedule regular health, dental, and eye exams.  Stay current with your immunizations.  Do not use any tobacco products including cigarettes, chewing tobacco, or electronic cigarettes.  If you are pregnant, do not drink alcohol.  If you are breastfeeding, limit how  much and how often you drink alcohol.  Limit alcohol intake to no more than 1 drink per day for nonpregnant women. One drink equals 12 ounces of beer, 5 ounces of , or 1 ounces of hard liquor.  Do not use street drugs.  Do not share needles.  Ask your health care provider for help if you need support or information about quitting drugs.  Tell your health care provider if you often feel depressed.  Tell your health care provider if you have ever been abused or do not feel safe at home. This information is not intended to replace advice given to you by your health care provider. Make sure you discuss any questions you have with your health care provider. Document Released: 10/30/2010 Document Revised: 09/22/2015 Document Reviewed: 01/18/2015 Elsevier Interactive Patient Education  2019 [employer]  

## 2018-10-13 ENCOUNTER — Other Ambulatory Visit: Payer: Self-pay

## 2018-10-13 ENCOUNTER — Encounter: Payer: Self-pay | Admitting: Internal Medicine

## 2018-10-13 ENCOUNTER — Other Ambulatory Visit (INDEPENDENT_AMBULATORY_CARE_PROVIDER_SITE_OTHER): Payer: Medicare Other

## 2018-10-13 ENCOUNTER — Ambulatory Visit (INDEPENDENT_AMBULATORY_CARE_PROVIDER_SITE_OTHER): Payer: Medicare Other | Admitting: Internal Medicine

## 2018-10-13 DIAGNOSIS — J479 Bronchiectasis, uncomplicated: Secondary | ICD-10-CM | POA: Diagnosis not present

## 2018-10-13 DIAGNOSIS — J301 Allergic rhinitis due to pollen: Secondary | ICD-10-CM | POA: Diagnosis not present

## 2018-10-13 DIAGNOSIS — I2510 Atherosclerotic heart disease of native coronary artery without angina pectoris: Secondary | ICD-10-CM | POA: Diagnosis not present

## 2018-10-13 DIAGNOSIS — F411 Generalized anxiety disorder: Secondary | ICD-10-CM | POA: Diagnosis not present

## 2018-10-13 DIAGNOSIS — E785 Hyperlipidemia, unspecified: Secondary | ICD-10-CM | POA: Diagnosis not present

## 2018-10-13 LAB — CBC WITH DIFFERENTIAL/PLATELET
Basophils Absolute: 0.1 10*3/uL (ref 0.0–0.1)
Basophils Relative: 0.9 % (ref 0.0–3.0)
Eosinophils Absolute: 0.2 10*3/uL (ref 0.0–0.7)
Eosinophils Relative: 2.6 % (ref 0.0–5.0)
HCT: 39.2 % (ref 36.0–46.0)
Hemoglobin: 13.2 g/dL (ref 12.0–15.0)
Lymphocytes Relative: 24.8 % (ref 12.0–46.0)
Lymphs Abs: 1.6 10*3/uL (ref 0.7–4.0)
MCHC: 33.5 g/dL (ref 30.0–36.0)
MCV: 96.2 fl (ref 78.0–100.0)
Monocytes Absolute: 0.5 10*3/uL (ref 0.1–1.0)
Monocytes Relative: 7.9 % (ref 3.0–12.0)
Neutro Abs: 4.1 10*3/uL (ref 1.4–7.7)
Neutrophils Relative %: 63.8 % (ref 43.0–77.0)
Platelets: 199 10*3/uL (ref 150.0–400.0)
RBC: 4.08 Mil/uL (ref 3.87–5.11)
RDW: 13.1 % (ref 11.5–15.5)
WBC: 6.5 10*3/uL (ref 4.0–10.5)

## 2018-10-13 LAB — HEPATIC FUNCTION PANEL
ALT: 16 U/L (ref 0–35)
AST: 19 U/L (ref 0–37)
Albumin: 4 g/dL (ref 3.5–5.2)
Alkaline Phosphatase: 79 U/L (ref 39–117)
Bilirubin, Direct: 0.1 mg/dL (ref 0.0–0.3)
Total Bilirubin: 0.7 mg/dL (ref 0.2–1.2)
Total Protein: 6.4 g/dL (ref 6.0–8.3)

## 2018-10-13 LAB — LIPID PANEL
Cholesterol: 205 mg/dL — ABNORMAL HIGH (ref 0–200)
HDL: 65.4 mg/dL (ref >39.00)
LDL Cholesterol: 125 mg/dL — ABNORMAL HIGH (ref 0–99)
NonHDL: 139.26
Total CHOL/HDL Ratio: 3
Triglycerides: 71 mg/dL (ref 0.0–149.0)
VLDL: 14.2 mg/dL (ref 0.0–40.0)

## 2018-10-13 LAB — BASIC METABOLIC PANEL
BUN: 20 mg/dL (ref 6–23)
CO2: 26 mEq/L (ref 19–32)
Calcium: 9.3 mg/dL (ref 8.4–10.5)
Chloride: 104 mEq/L (ref 96–112)
Creatinine, Ser: 0.67 mg/dL (ref 0.40–1.20)
GFR: 84.56 mL/min (ref >60.00)
Glucose, Bld: 79 mg/dL (ref 70–99)
Potassium: 4.3 mEq/L (ref 3.5–5.1)
Sodium: 137 mEq/L (ref 135–145)

## 2018-10-13 LAB — TSH: TSH: 4.05 u[IU]/mL (ref 0.35–4.50)

## 2018-10-13 NOTE — Assessment & Plan Note (Signed)
Doing well 

## 2018-10-13 NOTE — Assessment & Plan Note (Signed)
Pt declined statins 

## 2018-10-13 NOTE — Progress Notes (Signed)
Subjective:  Patient ID: Julie Montgomery, female    DOB: 1936-02-12  Age: 83 y.o. MRN: [id number]  CC: No chief complaint on file.   HPI Julie Montgomery presents for anxiety, Vit D def, allergies f/u  Outpatient Medications Prior to Visit  Medication Sig Dispense Refill  . Cholecalciferol (VITAMIN D3) 1000 UNITS CAPS Take 1 capsule by mouth daily. Take total of 2 daily    . acetaminophen (TYLENOL) 500 MG tablet Take 500 mg by mouth every 6 (six) hours as needed for mild pain.     . Ascorbic Acid (VITAMIN C PO) Take by mouth.    Marland Kitchen aspirin (ASPIRIN 81) 81 MG EC tablet Take 81 mg by mouth daily. Swallow whole.    . busPIRone (BUSPAR) 15 MG tablet Take 1/3 or 1/2 tablet bid for anxiety (Patient not taking: Reported on 10/13/2018) 60 tablet 5  . fluticasone (FLONASE) 50 MCG/ACT nasal spray Place 2 sprays into both nostrils daily. (Patient not taking: Reported on 10/13/2018) 16 g 6  . LORazepam (ATIVAN) 0.5 MG tablet Take 1 tablet (0.5 mg total) by mouth 2 (two) times daily as needed for anxiety. (Patient not taking: Reported on 10/13/2018) 30 tablet 1  . meclizine (ANTIVERT) 12.5 MG tablet Take 1 tablet (12.5 mg total) by mouth 3 (three) times daily as needed for dizziness. (Patient not taking: Reported on 10/13/2018) 60 tablet 1  . psyllium (REGULOID) 0.52 g capsule Take 0.52 g by mouth daily.     No facility-administered medications prior to visit.     ROS: Review of Systems  Constitutional: Negative for activity change, appetite change, chills, fatigue and unexpected weight change.  HENT: Negative for congestion, mouth sores and sinus pressure.   Eyes: Negative for visual disturbance.  Respiratory: Negative for cough and chest tightness.   Gastrointestinal: Negative for abdominal pain and nausea.  Genitourinary: Negative for difficulty urinating, frequency and vaginal pain.  Musculoskeletal: Negative for back pain and gait problem.  Skin: Negative for pallor and rash.  Neurological:  Negative for dizziness, tremors, weakness, numbness and headaches.  Psychiatric/Behavioral: Negative for confusion, dysphoric mood, sleep disturbance and suicidal ideas. The patient is nervous/anxious.     Objective:  BP 128/66 (BP Location: Left Arm, Patient Position: Sitting, Cuff Size: Normal)   Pulse 69   Temp 97.7 F (36.5 C) (Oral)   Ht 5\' 3"  (1.6 m)   Wt 93 lb (42.2 kg)   SpO2 99%   BMI 16.47 kg/m   BP Readings from Last 3 Encounters:  10/13/18 128/66  06/25/18 126/70  06/11/18 124/62    Wt Readings from Last 3 Encounters:  10/13/18 93 lb (42.2 kg)  06/25/18 93 lb (42.2 kg)  06/11/18 93 lb (42.2 kg)    Physical Exam Constitutional:      General: She is not in acute distress.    Appearance: She is well-developed.  HENT:     Head: Normocephalic.     Right Ear: External ear normal.     Left Ear: External ear normal.     Nose: Nose normal.  Eyes:     General:        Right eye: No discharge.        Left eye: No discharge.     Conjunctiva/sclera: Conjunctivae normal.     Pupils: Pupils are equal, round, and reactive to light.  Neck:     Musculoskeletal: Normal range of motion and neck supple.     Thyroid: No thyromegaly.  Vascular: No JVD.     Trachea: No tracheal deviation.  Cardiovascular:     Rate and Rhythm: Normal rate and regular rhythm.     Heart sounds: Normal heart sounds.  Pulmonary:     Effort: No respiratory distress.     Breath sounds: No stridor. No wheezing.  Abdominal:     General: Bowel sounds are normal. There is no distension.     Palpations: Abdomen is soft. There is no mass.     Tenderness: There is no abdominal tenderness. There is no guarding or rebound.  Musculoskeletal:        General: No tenderness.  Lymphadenopathy:     Cervical: No cervical adenopathy.  Skin:    Findings: No erythema or rash.  Neurological:     Mental Status: She is oriented to person, place, and time.     Cranial Nerves: No cranial nerve deficit.      Motor: No abnormal muscle tone.     Coordination: Coordination normal.     Gait: Gait normal.     Deep Tendon Reflexes: Reflexes normal.  Psychiatric:        Mood and Affect: Mood normal.        Behavior: Behavior normal.        Thought Content: Thought content normal.        Judgment: Judgment normal.   Thin  Lab Results  Component Value Date   WBC 5.9 02/28/2017   HGB 13.4 02/28/2017   HCT 40.5 02/28/2017   PLT 219.0 02/28/2017   GLUCOSE 96 02/28/2017   CHOL 209 (H) 02/28/2017   TRIG 81.0 02/28/2017   HDL 60.30 02/28/2017   LDLDIRECT 145.7 05/12/2013   LDLCALC 132 (H) 02/28/2017   ALT 14 02/28/2017   AST 17 02/28/2017   NA 138 02/28/2017   K 4.2 02/28/2017   CL 104 02/28/2017   CREATININE 0.65 02/28/2017   BUN 20 02/28/2017   CO2 30 02/28/2017   TSH 3.18 02/28/2017    Dg Chest 2 View  Result Date: 05/13/2018 CLINICAL DATA:  Cough and congestion for several days EXAM: CHEST - 2 VIEW COMPARISON:  06/12/2017 FINDINGS: Cardiac shadow is stable. Aortic calcifications are again seen. The lungs are hyperinflated with patchy right basilar infiltrate laterally. Mild interstitial changes are again seen and stable. No acute bony abnormality is noted. IMPRESSION: Acute on chronic right basilar infiltrate laterally. Electronically Signed   By: Inez Catalina M.D.   On: 05/13/2018 19:45    Assessment & Plan:   There are no diagnoses linked to this encounter.   No orders of the defined types were placed in this encounter.    Follow-up: No follow-ups on file.  Walker Kehr, MD

## 2018-10-13 NOTE — Assessment & Plan Note (Signed)
On Mucinex

## 2018-11-26 DIAGNOSIS — J479 Bronchiectasis, uncomplicated: Secondary | ICD-10-CM | POA: Diagnosis not present

## 2018-12-18 DIAGNOSIS — D1801 Hemangioma of skin and subcutaneous tissue: Secondary | ICD-10-CM | POA: Diagnosis not present

## 2018-12-18 DIAGNOSIS — Z85828 Personal history of other malignant neoplasm of skin: Secondary | ICD-10-CM | POA: Diagnosis not present

## 2018-12-18 DIAGNOSIS — L820 Inflamed seborrheic keratosis: Secondary | ICD-10-CM | POA: Diagnosis not present

## 2018-12-18 DIAGNOSIS — L570 Actinic keratosis: Secondary | ICD-10-CM | POA: Diagnosis not present

## 2018-12-18 DIAGNOSIS — L821 Other seborrheic keratosis: Secondary | ICD-10-CM | POA: Diagnosis not present

## 2019-01-12 ENCOUNTER — Ambulatory Visit: Payer: Medicare Other

## 2019-01-13 DIAGNOSIS — M810 Age-related osteoporosis without current pathological fracture: Secondary | ICD-10-CM | POA: Insufficient documentation

## 2019-01-13 DIAGNOSIS — Z1231 Encounter for screening mammogram for malignant neoplasm of breast: Secondary | ICD-10-CM | POA: Diagnosis not present

## 2019-01-13 DIAGNOSIS — M629 Disorder of muscle, unspecified: Secondary | ICD-10-CM | POA: Diagnosis not present

## 2019-01-13 DIAGNOSIS — R3915 Urgency of urination: Secondary | ICD-10-CM | POA: Diagnosis not present

## 2019-01-13 DIAGNOSIS — Z01419 Encounter for gynecological examination (general) (routine) without abnormal findings: Secondary | ICD-10-CM | POA: Diagnosis not present

## 2019-01-13 DIAGNOSIS — Z1211 Encounter for screening for malignant neoplasm of colon: Secondary | ICD-10-CM | POA: Diagnosis not present

## 2019-01-19 ENCOUNTER — Other Ambulatory Visit: Payer: Self-pay

## 2019-01-19 ENCOUNTER — Encounter: Payer: Self-pay | Admitting: Internal Medicine

## 2019-01-19 ENCOUNTER — Ambulatory Visit (INDEPENDENT_AMBULATORY_CARE_PROVIDER_SITE_OTHER): Payer: Medicare Other | Admitting: Internal Medicine

## 2019-01-19 VITALS — BP 130/74 | HR 72 | Temp 98.1°F | Ht 63.0 in | Wt 91.0 lb

## 2019-01-19 DIAGNOSIS — K589 Irritable bowel syndrome without diarrhea: Secondary | ICD-10-CM

## 2019-01-19 DIAGNOSIS — I2510 Atherosclerotic heart disease of native coronary artery without angina pectoris: Secondary | ICD-10-CM

## 2019-01-19 DIAGNOSIS — R634 Abnormal weight loss: Secondary | ICD-10-CM | POA: Diagnosis not present

## 2019-01-19 DIAGNOSIS — F439 Reaction to severe stress, unspecified: Secondary | ICD-10-CM

## 2019-01-19 DIAGNOSIS — F411 Generalized anxiety disorder: Secondary | ICD-10-CM | POA: Diagnosis not present

## 2019-01-19 DIAGNOSIS — Z23 Encounter for immunization: Secondary | ICD-10-CM

## 2019-01-19 NOTE — Assessment & Plan Note (Signed)
Wt Readings from Last 3 Encounters:  01/19/19 91 lb (41.3 kg)  10/13/18 93 lb (42.2 kg)  06/25/18 93 lb (42.2 kg)

## 2019-01-19 NOTE — Assessment & Plan Note (Signed)
Probable IBS vs other: abd discomfort, epig pain, mucus in stool  -- 2-3 BM/d, wt loss Pt had abd CT, colonoscopy in 2017  OV w/Dr Fuller Plan
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
## 2019-01-19 NOTE — Patient Instructions (Addendum)
If you have medicare related insurance (such as traditional Medicare, [employer], [employer], or similar), Please make an appointment at the scheduling desk with Sharee Pimple, the [employer], for your Wellness visit in this office, which is a benefit with your insurance.   Midnight in the [place] [facility] Good [place] [facility] by Jeris Penta

## 2019-01-19 NOTE — Assessment & Plan Note (Signed)
Pt was thinking of getting another dog

## 2019-01-19 NOTE — Progress Notes (Signed)
Subjective:  Patient ID: Julie Montgomery, female    DOB: [birth year]  Age: 83 y.o. MRN: [id number]  CC: No chief complaint on file.   HPI SHARMAIN AMOAH presents for abd discomfort, epig pain, mucus in stool  -- 2-3 BM/d C/o wt loss Pt had abd CT, colonoscopy in 2017  Outpatient Medications Prior to Visit  Medication Sig Dispense Refill  . acetaminophen (TYLENOL) 500 MG tablet Take 500 mg by mouth every 6 (six) hours as needed for mild pain.     . Ascorbic Acid (VITAMIN C PO) Take by mouth.    Marland Kitchen aspirin (ASPIRIN 81) 81 MG EC tablet Take 81 mg by mouth daily. Swallow whole.    . Cholecalciferol (VITAMIN D3) 1000 UNITS CAPS Take 1 capsule by mouth daily. Take total of 2 daily    . fluticasone (FLONASE) 50 MCG/ACT nasal spray Place 2 sprays into both nostrils daily. 16 g 6  . LORazepam (ATIVAN) 0.5 MG tablet Take 1 tablet (0.5 mg total) by mouth 2 (two) times daily as needed for anxiety. 30 tablet 1  . meclizine (ANTIVERT) 12.5 MG tablet Take 1 tablet (12.5 mg total) by mouth 3 (three) times daily as needed for dizziness. 60 tablet 1  . psyllium (REGULOID) 0.52 g capsule Take 0.52 g by mouth daily.     No facility-administered medications prior to visit.     ROS: Review of Systems  Constitutional: Positive for fatigue and unexpected weight change. Negative for activity change, appetite change and chills.  HENT: Negative for congestion, mouth sores and sinus pressure.   Eyes: Negative for visual disturbance.  Respiratory: Negative for cough and chest tightness.   Gastrointestinal: Positive for abdominal pain. Negative for nausea.  Genitourinary: Negative for difficulty urinating, frequency and vaginal pain.  Musculoskeletal: Negative for back pain and gait problem.  Skin: Negative for pallor and rash.  Neurological: Negative for dizziness, tremors, weakness, numbness and headaches.  Psychiatric/Behavioral: Negative for confusion, sleep disturbance and suicidal ideas.     Objective:  BP 130/74 (BP Location: Left Arm, Patient Position: Sitting, Cuff Size: Normal)   Pulse 72   Temp 98.1 F (36.7 C) (Oral)   Ht 5\' 3"  (1.6 m)   Wt 91 lb (41.3 kg)   SpO2 96%   BMI 16.12 kg/m   BP Readings from Last 3 Encounters:  01/19/19 130/74  10/13/18 128/66  06/25/18 126/70    Wt Readings from Last 3 Encounters:  01/19/19 91 lb (41.3 kg)  10/13/18 93 lb (42.2 kg)  06/25/18 93 lb (42.2 kg)    Physical Exam Constitutional:      General: She is not in acute distress.    Appearance: She is well-developed.  HENT:     Head: Normocephalic.     Right Ear: External ear normal.     Left Ear: External ear normal.     Nose: Nose normal.  Eyes:     General:        Right eye: No discharge.        Left eye: No discharge.     Conjunctiva/sclera: Conjunctivae normal.     Pupils: Pupils are equal, round, and reactive to light.  Neck:     Musculoskeletal: Normal range of motion and neck supple.     Thyroid: No thyromegaly.     Vascular: No JVD.     Trachea: No tracheal deviation.  Cardiovascular:     Rate and Rhythm: Normal rate and regular rhythm.  Heart sounds: Normal heart sounds.  Pulmonary:     Effort: No respiratory distress.     Breath sounds: No stridor. No wheezing.  Abdominal:     General: Bowel sounds are normal. There is no distension.     Palpations: Abdomen is soft. There is no mass.     Tenderness: There is no abdominal tenderness. There is no guarding or rebound.  Musculoskeletal:        General: No tenderness.  Lymphadenopathy:     Cervical: No cervical adenopathy.  Skin:    Findings: No erythema or rash.  Neurological:     Cranial Nerves: No cranial nerve deficit.     Motor: No abnormal muscle tone.     Coordination: Coordination normal.     Deep Tendon Reflexes: Reflexes normal.  Psychiatric:        Behavior: Behavior normal.        Thought Content: Thought content normal.        Judgment: Judgment normal.   Thin Looks well   Lab Results  Component Value Date   WBC 6.5 10/13/2018   HGB 13.2 10/13/2018   HCT 39.2 10/13/2018   PLT 199.0 10/13/2018   GLUCOSE 79 10/13/2018   CHOL 205 (H) 10/13/2018   TRIG 71.0 10/13/2018   HDL 65.40 10/13/2018   LDLDIRECT 145.7 05/12/2013   LDLCALC 125 (H) 10/13/2018   ALT 16 10/13/2018   AST 19 10/13/2018   NA 137 10/13/2018   K 4.3 10/13/2018   CL 104 10/13/2018   CREATININE 0.67 10/13/2018   BUN 20 10/13/2018   CO2 26 10/13/2018   TSH 4.05 10/13/2018    Dg Chest 2 View  Result Date: 05/13/2018 CLINICAL DATA:  Cough and congestion for several days EXAM: CHEST - 2 VIEW COMPARISON:  06/12/2017 FINDINGS: Cardiac shadow is stable. Aortic calcifications are again seen. The lungs are hyperinflated with patchy right basilar infiltrate laterally. Mild interstitial changes are again seen and stable. No acute bony abnormality is noted. IMPRESSION: Acute on chronic right basilar infiltrate laterally. Electronically Signed   By: Inez Catalina M.D.   On: 05/13/2018 19:45    Assessment & Plan:   Diagnoses and all orders for this visit:  Need for influenza vaccination -     Flu Vaccine QUAD High Dose(Fluad)     No orders of the defined types were placed in this encounter.    Follow-up: No follow-ups on file.  Walker Kehr, MD

## 2019-01-20 ENCOUNTER — Encounter: Payer: Self-pay | Admitting: Gastroenterology

## 2019-01-25 ENCOUNTER — Encounter: Payer: Self-pay | Admitting: Internal Medicine

## 2019-02-10 DIAGNOSIS — N3941 Urge incontinence: Secondary | ICD-10-CM | POA: Diagnosis not present

## 2019-02-10 DIAGNOSIS — R3915 Urgency of urination: Secondary | ICD-10-CM | POA: Diagnosis not present

## 2019-02-10 DIAGNOSIS — M6281 Muscle weakness (generalized): Secondary | ICD-10-CM | POA: Diagnosis not present

## 2019-02-16 DIAGNOSIS — N3941 Urge incontinence: Secondary | ICD-10-CM | POA: Diagnosis not present

## 2019-02-16 DIAGNOSIS — M6281 Muscle weakness (generalized): Secondary | ICD-10-CM | POA: Diagnosis not present

## 2019-02-16 DIAGNOSIS — R3915 Urgency of urination: Secondary | ICD-10-CM | POA: Diagnosis not present

## 2019-02-25 ENCOUNTER — Encounter: Payer: Self-pay | Admitting: Internal Medicine

## 2019-02-25 ENCOUNTER — Other Ambulatory Visit (INDEPENDENT_AMBULATORY_CARE_PROVIDER_SITE_OTHER): Payer: Medicare Other

## 2019-02-25 ENCOUNTER — Other Ambulatory Visit: Payer: Self-pay

## 2019-02-25 ENCOUNTER — Encounter: Payer: Self-pay | Admitting: Gastroenterology

## 2019-02-25 ENCOUNTER — Ambulatory Visit (INDEPENDENT_AMBULATORY_CARE_PROVIDER_SITE_OTHER): Payer: Medicare Other | Admitting: Gastroenterology

## 2019-02-25 ENCOUNTER — Ambulatory Visit (INDEPENDENT_AMBULATORY_CARE_PROVIDER_SITE_OTHER): Payer: Medicare Other | Admitting: Internal Medicine

## 2019-02-25 VITALS — BP 109/60 | HR 76 | Temp 98.4°F | Ht 63.0 in | Wt 89.0 lb

## 2019-02-25 DIAGNOSIS — R634 Abnormal weight loss: Secondary | ICD-10-CM | POA: Diagnosis not present

## 2019-02-25 DIAGNOSIS — R194 Change in bowel habit: Secondary | ICD-10-CM | POA: Diagnosis not present

## 2019-02-25 DIAGNOSIS — I2510 Atherosclerotic heart disease of native coronary artery without angina pectoris: Secondary | ICD-10-CM

## 2019-02-25 DIAGNOSIS — F411 Generalized anxiety disorder: Secondary | ICD-10-CM

## 2019-02-25 DIAGNOSIS — R630 Anorexia: Secondary | ICD-10-CM

## 2019-02-25 DIAGNOSIS — R6881 Early satiety: Secondary | ICD-10-CM | POA: Diagnosis not present

## 2019-02-25 LAB — COMPREHENSIVE METABOLIC PANEL
ALT: 19 U/L (ref 0–35)
AST: 20 U/L (ref 0–37)
Albumin: 4.2 g/dL (ref 3.5–5.2)
Alkaline Phosphatase: 89 U/L (ref 39–117)
BUN: 20 mg/dL (ref 6–23)
CO2: 29 mEq/L (ref 19–32)
Calcium: 9.8 mg/dL (ref 8.4–10.5)
Chloride: 103 mEq/L (ref 96–112)
Creatinine, Ser: 0.67 mg/dL (ref 0.40–1.20)
GFR: 84.48 mL/min (ref >60.00)
Glucose, Bld: 86 mg/dL (ref 70–99)
Potassium: 4 mEq/L (ref 3.5–5.1)
Sodium: 137 mEq/L (ref 135–145)
Total Bilirubin: 0.5 mg/dL (ref 0.2–1.2)
Total Protein: 6.8 g/dL (ref 6.0–8.3)

## 2019-02-25 LAB — CBC WITH DIFFERENTIAL/PLATELET
Basophils Absolute: 0 10*3/uL (ref 0.0–0.1)
Basophils Relative: 0.3 % (ref 0.0–3.0)
Eosinophils Absolute: 0.1 10*3/uL (ref 0.0–0.7)
Eosinophils Relative: 1.4 % (ref 0.0–5.0)
HCT: 40.3 % (ref 36.0–46.0)
Hemoglobin: 13.5 g/dL (ref 12.0–15.0)
Lymphocytes Relative: 17.4 % (ref 12.0–46.0)
Lymphs Abs: 1.1 10*3/uL (ref 0.7–4.0)
MCHC: 33.4 g/dL (ref 30.0–36.0)
MCV: 95.7 fl (ref 78.0–100.0)
Monocytes Absolute: 0.5 10*3/uL (ref 0.1–1.0)
Monocytes Relative: 7.4 % (ref 3.0–12.0)
Neutro Abs: 4.8 10*3/uL (ref 1.4–7.7)
Neutrophils Relative %: 73.5 % (ref 43.0–77.0)
Platelets: 209 10*3/uL (ref 150.0–400.0)
RBC: 4.21 Mil/uL (ref 3.87–5.11)
RDW: 13 % (ref 11.5–15.5)
WBC: 6.5 10*3/uL (ref 4.0–10.5)

## 2019-02-25 LAB — IGA: IgA: 160 mg/dL (ref 68–378)

## 2019-02-25 MED ORDER — PANTOPRAZOLE SODIUM 40 MG PO TBEC: 40.0000 mg | DELAYED_RELEASE_TABLET | Freq: Every day | ORAL | 11 refills | Status: DC

## 2019-02-25 MED ORDER — ESCITALOPRAM OXALATE 5 MG PO TABS: 5.0000 mg | ORAL_TABLET | Freq: Every day | ORAL | 5 refills | Status: DC

## 2019-02-25 MED ORDER — LORAZEPAM 0.5 MG PO TABS: 0.5000 mg | ORAL_TABLET | Freq: Two times a day (BID) | ORAL | 2 refills | Status: DC | PRN

## 2019-02-25 MED ORDER — DICYCLOMINE HCL 10 MG PO CAPS: 10.0000 mg | ORAL_CAPSULE | Freq: Three times a day (TID) | ORAL | 11 refills | Status: DC

## 2019-02-25 NOTE — Patient Instructions (Signed)
Your provider has requested that you go to the basement level for lab work before leaving today. Press "B" on the elevator. The lab is located at the first door on the left as you exit the elevator.  We have sent the following medications to your pharmacy for you to pick up at your convenience: pantoprazole and dicyclomine.   You have been scheduled for a CT scan of the abdomen and pelvis at [facility].  You are scheduled on 03/03/19 at 8:00am. You should arrive 15 minutes prior to your appointment time for registration. Please follow the written instructions below on the day of your exam:  WARNING: IF YOU ARE ALLERGIC TO IODINE/X-RAY DYE, PLEASE NOTIFY RADIOLOGY IMMEDIATELY AT [phone]! YOU WILL BE GIVEN A 13 HOUR PREMEDICATION PREP.  1) Do not eat or drink anything after 4:00am (4 hours prior to your test) 2) You have been given 2 bottles of oral contrast to drink. The solution may taste better if refrigerated, but do NOT add ice or any other liquid to this solution. Shake well before drinking.    Drink 1 bottle of contrast @ 6:00am (2 hours prior to your exam)  Drink 1 bottle of contrast @ 7:00am (1 hour prior to your exam)  You may take any medications as prescribed with a small amount of water, if necessary. If you take any of the following medications: METFORMIN, GLUCOPHAGE, GLUCOVANCE, AVANDAMET, RIOMET, FORTAMET, ACTOPLUS MET, JANUMET, GLUMETZA or METAGLIP, you MAY be asked to HOLD this medication 48 hours AFTER the exam.  The purpose of you drinking the oral contrast is to aid in the visualization of your intestinal tract. The contrast solution may cause some diarrhea. Depending on your individual set of symptoms, you may also receive an intravenous injection of x-ray contrast/dye.  This test typically takes 30-45 minutes to complete.  If you have any questions regarding your exam or if you need to reschedule, you may call the CT department at [phone]  between the hours of 8:00 am and 5:00 pm, Monday-Friday.  Thank you for choosing me and [facility].  Malcolm T. Stark, Jr., MD., FACG     

## 2019-02-25 NOTE — Assessment & Plan Note (Signed)
Worse - Xanax prn. Start Lexapro 5 mg a day

## 2019-02-25 NOTE — Progress Notes (Signed)
Subjective:  Patient ID: Julie Montgomery, female    DOB: 1936-02-24  Age: 83 y.o. MRN: [id number]  CC: No chief complaint on file.   HPI MISTELLE BOFFA presents for wt loss, abd discomfort f/u C/o anxiety - worse  Per Dr Fuller Plan:  "1.  Change in bowel habits, weight loss, appetite loss, early satiety.  Bowel habits have almost returned to normal and abdominal pain has resolved.  Rule out ulcer, gastritis, GERD, occult neoplasm.  Begin pantoprazole 40 mg daily and dicyclomine 10 mg p.o. 3 times daily taken 30 minutes AC.  CBC, CMP, tTG, IgA today.  Schedule abdominal pelvic CT.  If causes not uncovered will plan for EGD. REV in 1 month.   2. Lactose intolerant.  Avoid lactose products."   Outpatient Medications Prior to Visit  Medication Sig Dispense Refill  . acetaminophen (TYLENOL) 500 MG tablet Take 500 mg by mouth every 6 (six) hours as needed for mild pain.     . Ascorbic Acid (VITAMIN C PO) Take by mouth.    Marland Kitchen aspirin (ASPIRIN 81) 81 MG EC tablet Take 81 mg by mouth daily. Swallow whole.    . Cholecalciferol (VITAMIN D3) 1000 UNITS CAPS Take 1 capsule by mouth daily. Take total of 2 daily    . dicyclomine (BENTYL) 10 MG capsule Take 1 capsule (10 mg total) by mouth 3 (three) times daily before meals. 90 capsule 11  . LORazepam (ATIVAN) 0.5 MG tablet Take 1 tablet (0.5 mg total) by mouth 2 (two) times daily as needed for anxiety. 30 tablet 1  . meclizine (ANTIVERT) 12.5 MG tablet Take 1 tablet (12.5 mg total) by mouth 3 (three) times daily as needed for dizziness. 60 tablet 1  . pantoprazole (PROTONIX) 40 MG tablet Take 1 tablet (40 mg total) by mouth daily. 30 tablet 11   No facility-administered medications prior to visit.     ROS: Review of Systems  Constitutional: Positive for fatigue and unexpected weight change. Negative for activity change, appetite change and chills.  HENT: Negative for congestion, mouth sores and sinus pressure.   Eyes: Negative for visual  disturbance.  Respiratory: Negative for cough and chest tightness.   Gastrointestinal: Negative for abdominal pain and nausea.  Genitourinary: Negative for difficulty urinating, frequency and vaginal pain.  Musculoskeletal: Negative for back pain and gait problem.  Skin: Negative for pallor and rash.  Neurological: Negative for dizziness, tremors, weakness, numbness and headaches.  Psychiatric/Behavioral: Positive for sleep disturbance. Negative for confusion and suicidal ideas. The patient is nervous/anxious.     Objective:  BP 122/64 (BP Location: Left Arm, Patient Position: Sitting, Cuff Size: Normal)   Pulse 71   Temp 98.3 F (36.8 C) (Oral)   Ht 5\' 3"  (1.6 m)   Wt 91 lb (41.3 kg)   SpO2 96%   BMI 16.12 kg/m   BP Readings from Last 3 Encounters:  02/25/19 122/64  02/25/19 109/60  01/19/19 130/74    Wt Readings from Last 3 Encounters:  02/25/19 91 lb (41.3 kg)  02/25/19 89 lb (40.4 kg)  01/19/19 91 lb (41.3 kg)    Physical Exam Constitutional:      General: She is not in acute distress.    Appearance: Normal appearance. She is well-developed. She is not ill-appearing.  HENT:     Head: Normocephalic.     Right Ear: External ear normal.     Left Ear: External ear normal.     Nose: Nose normal.  Eyes:  General:        Right eye: No discharge.        Left eye: No discharge.     Conjunctiva/sclera: Conjunctivae normal.     Pupils: Pupils are equal, round, and reactive to light.  Neck:     Musculoskeletal: Normal range of motion and neck supple.     Thyroid: No thyromegaly.     Vascular: No JVD.     Trachea: No tracheal deviation.  Cardiovascular:     Rate and Rhythm: Normal rate and regular rhythm.     Heart sounds: Normal heart sounds.  Pulmonary:     Effort: No respiratory distress.     Breath sounds: No stridor. No wheezing.  Abdominal:     General: Bowel sounds are normal. There is no distension.     Palpations: Abdomen is soft. There is no mass.      Tenderness: There is no abdominal tenderness. There is no guarding or rebound.  Musculoskeletal:        General: No tenderness.  Lymphadenopathy:     Cervical: No cervical adenopathy.  Skin:    Findings: No erythema or rash.  Neurological:     Mental Status: She is oriented to person, place, and time.     Cranial Nerves: No cranial nerve deficit.     Motor: No abnormal muscle tone.     Coordination: Coordination normal.     Deep Tendon Reflexes: Reflexes normal.  Psychiatric:        Behavior: Behavior normal.        Thought Content: Thought content normal.        Judgment: Judgment normal.   Thin  Lab Results  Component Value Date   WBC 6.5 02/25/2019   HGB 13.5 02/25/2019   HCT 40.3 02/25/2019   PLT 209.0 02/25/2019   GLUCOSE 86 02/25/2019   CHOL 205 (H) 10/13/2018   TRIG 71.0 10/13/2018   HDL 65.40 10/13/2018   LDLDIRECT 145.7 05/12/2013   LDLCALC 125 (H) 10/13/2018   ALT 19 02/25/2019   AST 20 02/25/2019   NA 137 02/25/2019   K 4.0 02/25/2019   CL 103 02/25/2019   CREATININE 0.67 02/25/2019   BUN 20 02/25/2019   CO2 29 02/25/2019   TSH 4.05 10/13/2018    Dg Chest 2 View  Result Date: 05/13/2018 CLINICAL DATA:  Cough and congestion for several days EXAM: CHEST - 2 VIEW COMPARISON:  06/12/2017 FINDINGS: Cardiac shadow is stable. Aortic calcifications are again seen. The lungs are hyperinflated with patchy right basilar infiltrate laterally. Mild interstitial changes are again seen and stable. No acute bony abnormality is noted. IMPRESSION: Acute on chronic right basilar infiltrate laterally. Electronically Signed   By: Inez Catalina M.D.   On: 05/13/2018 19:45    Assessment & Plan:   There are no diagnoses linked to this encounter.   No orders of the defined types were placed in this encounter.    Follow-up: No follow-ups on file.  Walker Kehr, MD

## 2019-02-25 NOTE — Assessment & Plan Note (Signed)
Per Dr Fuller Plan:  "1.  Change in bowel habits, weight loss, appetite loss, early satiety.  Bowel habits have almost returned to normal and abdominal pain has resolved.  Rule out ulcer, gastritis, GERD, occult neoplasm.  Begin pantoprazole 40 mg daily and dicyclomine 10 mg p.o. 3 times daily taken 30 minutes AC.  CBC, CMP, tTG, IgA today.  Schedule abdominal pelvic CT.  If causes not uncovered will plan for EGD. REV in 1 month.   2. Lactose intolerant.  Avoid lactose products."  Treat anxiety

## 2019-02-25 NOTE — Progress Notes (Signed)
History of Present Illness: This is an 83 year old female referred by Plotnikov, Evie Lacks, MD for the evaluation of weight loss, change in bowel habits, abdominal pain, early satiety, loss of appetite. History of C diff in 2016 treated with Flagyl and Florastor.  She relates she was having problems with several loose stools per day with periumbilical pain for several weeks.  Those symptoms resolved a few weeks ago and now she is having 1 softer stool per day with no abdominal pain.  She has had ongoing problems with early satiety, decreased appetite and a mild weight loss of 4 pounds over the past 8 months.  She states she has felt anxious and depressed for the past few months. Denies constipation, change in stool caliber, melena, hematochezia, nausea, vomiting, dysphagia, reflux symptoms, chest pain.   Colonoscopy 05/2015 1. Angiodysplastic lesion in the ascending colon 2. Moderate diverticulosis in the sigmoid colon   Allergies  Allergen Reactions  . Fluoxetine Hcl     REACTION: jumpy  . Lovastatin   . Mometasone Furo-Formoterol Fum     Loss of appetite, laryngitis   . Sulfa Antibiotics    Outpatient Medications Prior to Visit  Medication Sig Dispense Refill  . acetaminophen (TYLENOL) 500 MG tablet Take 500 mg by mouth every 6 (six) hours as needed for mild pain.     . Ascorbic Acid (VITAMIN C PO) Take by mouth.    Marland Kitchen aspirin (ASPIRIN 81) 81 MG EC tablet Take 81 mg by mouth daily. Swallow whole.    . Cholecalciferol (VITAMIN D3) 1000 UNITS CAPS Take 1 capsule by mouth daily. Take total of 2 daily    . meclizine (ANTIVERT) 12.5 MG tablet Take 1 tablet (12.5 mg total) by mouth 3 (three) times daily as needed for dizziness. 60 tablet 1  . fluticasone (FLONASE) 50 MCG/ACT nasal spray Place 2 sprays into both nostrils daily. 16 g 6  . psyllium (REGULOID) 0.52 g capsule Take 0.52 g by mouth daily.    Marland Kitchen LORazepam (ATIVAN) 0.5 MG tablet Take 1 tablet (0.5 mg total) by mouth 2 (two) times  daily as needed for anxiety. (Patient not taking: Reported on 02/25/2019) 30 tablet 1   No facility-administered medications prior to visit.    Past Medical History:  Diagnosis Date  . Anxiety   . Osteopenia   . Pneumonia    hx of 03/2008   Past Surgical History:  Procedure Laterality Date  . APPENDECTOMY    . BREAST LUMPECTOMY     right  . CESAREAN SECTION    . CHOLECYSTECTOMY    . PARTIAL HYSTERECTOMY    . ROTATOR CUFF REPAIR  2012  . teeth remmoved     Social History   Socioeconomic History  . Marital status: Married    Spouse name: Not on file  . Number of children: 4  . Years of education: Not on file  . Highest education level: Not on file  Occupational History  . Occupation: Oceanographer  Social Needs  . Financial resource strain: Not hard at all  . Food insecurity    Worry: Never true    Inability: Never true  . Transportation needs    Medical: No    Non-medical: No  Tobacco Use  . Smoking status: Never Smoker  . Smokeless tobacco: Never Used  Substance and Sexual Activity  . Alcohol use: No  . Drug use: No  . Sexual activity: Not Currently  Lifestyle  . Physical activity  Days per week: 1 day    Minutes per session: 30 min  . Stress: To some extent  Relationships  . Social connections    Talks on phone: More than three times a week    Gets together: More than three times a week    Attends religious service: More than 4 times per year    Active member of club or organization: Yes    Attends meetings of clubs or organizations: More than 4 times per year    Relationship status: Married  Other Topics Concern  . Not on file  Social History Narrative   Married, Retired, she was looking after her GGD, now in court battle   Family History  Problem Relation Age of Onset  . Diabetes Sister   . Cancer Sister        lung ca  . Coronary artery disease Brother   . Dementia Brother   . Cancer Brother        lung ca  . Stomach cancer Paternal  Grandmother   . Breast cancer Neg Hx        Review of Systems: Pertinent positive and negative review of systems were noted in the above HPI section. All other review of systems were otherwise negative.    Physical Exam: General: Well developed, well nourished, thin, elderly, no acute distress Head: Normocephalic and atraumatic Eyes:  sclerae anicteric, EOMI Ears: Normal auditory acuity Mouth: No deformity or lesions Neck: Supple, no masses or thyromegaly Lungs: Clear throughout to auscultation Heart: Regular rate and rhythm; no murmurs, rubs or bruits Abdomen: Soft, non tender and non distended. No masses, hepatosplenomegaly or hernias noted. Normal Bowel sounds Rectal: Not done Musculoskeletal: Symmetrical with no gross deformities  Skin: No lesions on visible extremities Pulses:  Normal pulses noted Extremities: No clubbing, cyanosis, edema or deformities noted Neurological: Alert oriented x 4, grossly nonfocal Cervical Nodes:  No significant cervical adenopathy Inguinal Nodes: No significant inguinal adenopathy Psychological:  Alert and cooperative.  Depressed affect affect   Assessment and Recommendations:  1.  Change in bowel habits, weight loss, appetite loss, early satiety.  Bowel habits have almost returned to normal and abdominal pain has resolved.  Rule out ulcer, gastritis, GERD, occult neoplasm.  Begin pantoprazole 40 mg daily and dicyclomine 10 mg p.o. 3 times daily taken 30 minutes AC.  CBC, CMP, tTG, IgA today.  Schedule abdominal pelvic CT.  If causes not uncovered will plan for EGD. REV in 1 month.   2. Lactose intolerant.  Avoid lactose products.   cc: Plotnikov, Evie Lacks, MD [address] [place],  [place] [address]

## 2019-02-26 LAB — TISSUE TRANSGLUTAMINASE, IGA: (tTG) Ab, IgA: 1 U/mL

## 2019-03-03 ENCOUNTER — Encounter (HOSPITAL_COMMUNITY): Payer: Self-pay

## 2019-03-03 ENCOUNTER — Telehealth: Payer: Self-pay

## 2019-03-03 ENCOUNTER — Other Ambulatory Visit: Payer: Self-pay

## 2019-03-03 ENCOUNTER — Ambulatory Visit (HOSPITAL_COMMUNITY)
Admission: RE | Admit: 2019-03-03 | Discharge: 2019-03-03 | Disposition: A | Discharge status: Home / Self Care | Payer: Medicare Other | Source: Ambulatory Visit | Attending: Gastroenterology | Admitting: Gastroenterology

## 2019-03-03 DIAGNOSIS — R630 Anorexia: Secondary | ICD-10-CM

## 2019-03-03 DIAGNOSIS — R634 Abnormal weight loss: Secondary | ICD-10-CM | POA: Insufficient documentation

## 2019-03-03 DIAGNOSIS — Z1159 Encounter for screening for other viral diseases: Secondary | ICD-10-CM

## 2019-03-03 DIAGNOSIS — R194 Change in bowel habit: Secondary | ICD-10-CM

## 2019-03-03 DIAGNOSIS — R933 Abnormal findings on diagnostic imaging of other parts of digestive tract: Secondary | ICD-10-CM

## 2019-03-03 DIAGNOSIS — K5730 Diverticulosis of large intestine without perforation or abscess without bleeding: Secondary | ICD-10-CM | POA: Diagnosis not present

## 2019-03-03 DIAGNOSIS — R197 Diarrhea, unspecified: Secondary | ICD-10-CM | POA: Diagnosis not present

## 2019-03-03 DIAGNOSIS — R6881 Early satiety: Secondary | ICD-10-CM | POA: Diagnosis not present

## 2019-03-03 MED ORDER — SODIUM CHLORIDE (PF) 0.9 % IJ SOLN
INTRAMUSCULAR | Status: AC
  Filled 2019-03-03: qty 50

## 2019-03-03 MED ORDER — IOHEXOL 300 MG/ML  SOLN
100.0000 mL | Freq: Once | INTRAMUSCULAR | Status: AC | PRN
  Administered 2019-03-03: 100 mL via INTRAVENOUS

## 2019-03-03 MED ORDER — NA SULFATE-K SULFATE-MG SULF 17.5-3.13-1.6 GM/177ML PO SOLN: ORAL | 0 refills | Status: DC

## 2019-03-03 NOTE — Telephone Encounter (Signed)
See CT results for details

## 2019-03-04 LAB — SARS CORONAVIRUS 2 (TAT 6-24 HRS): SARS Coronavirus 2: NEGATIVE

## 2019-03-05 ENCOUNTER — Encounter: Payer: Self-pay | Admitting: Gastroenterology

## 2019-03-05 ENCOUNTER — Ambulatory Visit (AMBULATORY_SURGERY_CENTER): Payer: Medicare Other | Admitting: Gastroenterology

## 2019-03-05 ENCOUNTER — Other Ambulatory Visit: Payer: Self-pay

## 2019-03-05 VITALS — BP 99/62 | HR 82 | Temp 97.7°F | Resp 19 | Ht 63.0 in | Wt 91.0 lb

## 2019-03-05 DIAGNOSIS — R6881 Early satiety: Secondary | ICD-10-CM

## 2019-03-05 DIAGNOSIS — K2950 Unspecified chronic gastritis without bleeding: Secondary | ICD-10-CM | POA: Diagnosis not present

## 2019-03-05 DIAGNOSIS — K5730 Diverticulosis of large intestine without perforation or abscess without bleeding: Secondary | ICD-10-CM | POA: Diagnosis not present

## 2019-03-05 DIAGNOSIS — K6289 Other specified diseases of anus and rectum: Secondary | ICD-10-CM | POA: Diagnosis not present

## 2019-03-05 DIAGNOSIS — K219 Gastro-esophageal reflux disease without esophagitis: Secondary | ICD-10-CM | POA: Diagnosis not present

## 2019-03-05 DIAGNOSIS — R634 Abnormal weight loss: Secondary | ICD-10-CM | POA: Diagnosis not present

## 2019-03-05 DIAGNOSIS — D122 Benign neoplasm of ascending colon: Secondary | ICD-10-CM

## 2019-03-05 DIAGNOSIS — R194 Change in bowel habit: Secondary | ICD-10-CM | POA: Diagnosis not present

## 2019-03-05 DIAGNOSIS — B9681 Helicobacter pylori [H. pylori] as the cause of diseases classified elsewhere: Secondary | ICD-10-CM | POA: Diagnosis not present

## 2019-03-05 DIAGNOSIS — R933 Abnormal findings on diagnostic imaging of other parts of digestive tract: Secondary | ICD-10-CM

## 2019-03-05 DIAGNOSIS — R1013 Epigastric pain: Secondary | ICD-10-CM

## 2019-03-05 MED ORDER — SODIUM CHLORIDE 0.9 % IV SOLN: 500.0000 mL | Freq: Once | INTRAVENOUS | Status: DC

## 2019-03-05 NOTE — Progress Notes (Signed)
Called to room to assist during endoscopic procedure.  Patient ID and intended procedure confirmed with present staff. Received instructions for my participation in the procedure from the performing physician.  

## 2019-03-05 NOTE — Op Note (Signed)
[facility] Patient Name: Julie Montgomery Procedure Date: 03/05/2019 9:33 AM MRN: [id number] Endoscopist: Ladene Artist , MD Age: 83 Referring MD:  Date of Birth: Apr 27, 1936 Gender: Female Account #: [id number] Procedure:                Upper GI endoscopy Indications:              Epigastric abdominal pain, Early satiety, Weight                            loss Medicines:                Monitored Anesthesia Care Procedure:                Pre-Anesthesia Assessment:                           - Prior to the procedure, a History and Physical                            was performed, and patient medications and                            allergies were reviewed. The patient's tolerance of                            previous anesthesia was also reviewed. The risks                            and benefits of the procedure and the sedation                            options and risks were discussed with the patient.                            All questions were answered, and informed consent                            was obtained. Prior Anticoagulants: The patient has                            taken no previous anticoagulant or antiplatelet                            agents. ASA Grade Assessment: III - A patient with                            severe systemic disease. After reviewing the risks                            and benefits, the patient was deemed in                            satisfactory condition to undergo the procedure.  After obtaining informed consent, the endoscope was                            passed under direct vision. Throughout the                            procedure, the patient's blood pressure, pulse, and                            oxygen saturations were monitored continuously. The                            Endoscope was introduced through the mouth, and                            advanced to the second part of duodenum. The  upper                            GI endoscopy was accomplished without difficulty.                            The patient tolerated the procedure well. Scope In: Scope Out: Findings:                 The examined esophagus was normal.                           Patchy mild inflammation characterized by erosions,                            erythema and granularity was found in the gastric                            body and in the gastric antrum. Biopsies were taken                            with a cold forceps for histology.                           A small hiatal hernia was present.                           The exam of the stomach was otherwise normal.                           The duodenal bulb and second portion of the                            duodenum were normal. Complications:            No immediate complications. Estimated Blood Loss:     Estimated blood loss was minimal. Impression:               - Normal esophagus.                           -  Gastritis. Biopsied.                           - Small hiatal hernia.                           - Normal duodenal bulb and second portion of the                            duodenum. Recommendation:           - Patient has a contact number available for                            emergencies. The signs and symptoms of potential                            delayed complications were discussed with the                            patient. Return to normal activities tomorrow.                            Written discharge instructions were provided to the                            patient.                           - Resume previous diet.                           - Continue present medications.                           - Begin pantoprazole and dicyclomine as prescribed.                           - Await pathology results.                           - Return to GI office in 1 month. Ladene Artist, MD 03/05/2019 10:26:50 AM This report  has been signed electronically.

## 2019-03-05 NOTE — Progress Notes (Signed)
Vital signs by WR. Temp by LC.

## 2019-03-05 NOTE — Op Note (Signed)
[place] Patient Name: Julie Montgomery Procedure Date: 03/05/2019 9:34 AM MRN: [id number] Endoscopist: Ladene Artist , MD Age: 83 Referring MD:  Date of Birth: March 05, 1936 Gender: Female Account #: [id number] Procedure:                Colonoscopy Indications:              Abnormal CT of the GI tract, Change in bowel                            habits, Weight loss Medicines:                Monitored Anesthesia Care Procedure:                Pre-Anesthesia Assessment:                           - Prior to the procedure, a History and Physical                            was performed, and patient medications and                            allergies were reviewed. The patient's tolerance of                            previous anesthesia was also reviewed. The risks                            and benefits of the procedure and the sedation                            options and risks were discussed with the patient.                            All questions were answered, and informed consent                            was obtained. Prior Anticoagulants: The patient has                            taken no previous anticoagulant or antiplatelet                            agents. ASA Grade Assessment: III - A patient with                            severe systemic disease. After reviewing the risks                            and benefits, the patient was deemed in                            satisfactory condition to undergo the procedure.  After obtaining informed consent, the colonoscope                            was passed under direct vision. Throughout the                            procedure, the patient's blood pressure, pulse, and                            oxygen saturations were monitored continuously. The                            Colonoscope was introduced through the anus and                            advanced to the the cecum, identified by                             appendiceal orifice and ileocecal valve. The                            ileocecal valve, appendiceal orifice, and rectum                            were photographed. The quality of the bowel                            preparation was good. The colonoscopy was performed                            without difficulty. The patient tolerated the                            procedure well. Scope In: 9:40:25 AM Scope Out: 10:02:46 AM Scope Withdrawal Time: 0 hours 17 minutes 5 seconds  Total Procedure Duration: 0 hours 22 minutes 21 seconds  Findings:                 The perianal and digital rectal examinations were                            normal.                           A 7 mm polyp was found in the ascending colon. The                            polyp was sessile. The polyp was removed with a                            cold snare. Resection and retrieval were complete.                           Multiple small-mouthed diverticula were found in  the left colon. There was evidence of diverticular                            spasm. There was no evidence of diverticular                            bleeding.                           Anal papilla(e) were hypertrophied.                           The exam was otherwise without abnormality on                            direct and retroflexion views. Complications:            No immediate complications. Estimated blood loss:                            None. Estimated Blood Loss:     Estimated blood loss: none. Impression:               - One 7 mm polyp in the ascending colon, removed                            with a cold snare. Resected and retrieved.                           - Moderate diverticulosis in the left colon.                           - Anal papilla(e) were hypertrophied.                           - The examination was otherwise normal on direct                            and  retroflexion views. Recommendation:           - Patient has a contact number available for                            emergencies. The signs and symptoms of potential                            delayed complications were discussed with the                            patient. Return to normal activities tomorrow.                            Written discharge instructions were provided to the                            patient.                           -  Resume previous diet.                           - Continue present medications.                           - Miralax daily as needed for constipation.                           - Await pathology results.                           - No repeat colonoscopy due to age. Ladene Artist, MD 03/05/2019 10:22:22 AM This report has been signed electronically.

## 2019-03-05 NOTE — Patient Instructions (Addendum)
Read all of the handouts given to you by your recovery room nurse.  Thank-you for choosing [place] for your healthcare today.  Take your pantoprazole 1/2 hour before breakfast every day.  Take your miralax when you don't have a BM for a couple of days.  YOU HAD AN ENDOSCOPIC PROCEDURE TODAY AT [facility]:   Refer to the procedure report that was given to you for any specific questions about what was found during the examination.  If the procedure report does not answer your questions, please call your gastroenterologist to clarify.  If you requested that your care partner not be given the details of your procedure findings, then the procedure report has been included in a sealed envelope for you to review at your convenience later.  YOU SHOULD EXPECT: Some feelings of bloating in the abdomen. Passage of more gas than usual.  Walking can help get rid of the air that was put into your GI tract during the procedure and reduce the bloating. If you had a lower endoscopy (such as a colonoscopy or flexible sigmoidoscopy) you may notice spotting of blood in your stool or on the toilet paper. If you underwent a bowel prep for your procedure, you may not have a normal bowel movement for a few days.  Please Note:  You might notice some irritation and congestion in your nose or some drainage.  This is from the oxygen used during your procedure.  There is no need for concern and it should clear up in a day or so.  SYMPTOMS TO REPORT IMMEDIATELY:   Following lower endoscopy (colonoscopy or flexible sigmoidoscopy):  Excessive amounts of blood in the stool  Significant tenderness or worsening of abdominal pains  Swelling of the abdomen that is new, acute  Fever of 100F or higher   Following upper endoscopy (EGD)  Vomiting of blood or coffee ground material  New chest pain or pain under the shoulder blades  Painful or persistently difficult swallowing  New shortness of breath  Fever of 100F or  higher  Black, tarry-looking stools  For urgent or emergent issues, a gastroenterologist can be reached at any hour by calling [phone].   DIET:  We do recommend a small meal at first, but then you may proceed to your regular diet.  Drink plenty of fluids but you should avoid alcoholic beverages for 24 hours. Try to increase the fiber in your diet, and drink plenty of water.  ACTIVITY:  You should plan to take it easy for the rest of today and you should NOT DRIVE or use heavy machinery until tomorrow (because of the sedation medicines used during the test).    FOLLOW UP: Our staff will call the number listed on your records 48-72 hours following your procedure to check on you and address any questions or concerns that you may have regarding the information given to you following your procedure. If we do not reach you, we will leave a message.  We will attempt to reach you two times.  During this call, we will ask if you have developed any symptoms of COVID 19. If you develop any symptoms (ie: fever, flu-like symptoms, shortness of breath, cough etc.) before then, please call [phone].  If you test positive for Covid 19 in the 2 weeks post procedure, please call and report this information to [place].    If any biopsies were taken you will be contacted by phone or by letter within the next 1-3 weeks.  Please call us at [phone] if you have not heard about the biopsies in 3 weeks.    SIGNATURES/CONFIDENTIALITY: You and/or your care partner have signed paperwork which will be entered into your electronic medical record.  These signatures attest to the fact that that the information above on your After Visit Summary has been reviewed and is understood.  Full responsibility of the confidentiality of this discharge information lies with you and/or your care-partner.

## 2019-03-05 NOTE — Progress Notes (Signed)
Report to PACU, RN, vss, BBS= Clear.  

## 2019-03-09 ENCOUNTER — Telehealth: Payer: Self-pay | Admitting: *Deleted

## 2019-03-09 ENCOUNTER — Telehealth: Payer: Self-pay

## 2019-03-09 NOTE — Telephone Encounter (Signed)
No answer, message left for the patient. 

## 2019-03-09 NOTE — Telephone Encounter (Signed)
Second post procedure follow up call, no answer 

## 2019-03-17 ENCOUNTER — Other Ambulatory Visit: Payer: Self-pay

## 2019-03-17 MED ORDER — BISMUTH SUBSALICYLATE 262 MG PO CHEW: 524.0000 mg | CHEWABLE_TABLET | Freq: Four times a day (QID) | ORAL | 0 refills | Status: AC

## 2019-03-17 MED ORDER — PANTOPRAZOLE SODIUM 40 MG PO TBEC: 40.0000 mg | DELAYED_RELEASE_TABLET | Freq: Two times a day (BID) | ORAL | 3 refills | Status: DC

## 2019-03-17 MED ORDER — METRONIDAZOLE 250 MG PO TABS: 250.0000 mg | ORAL_TABLET | Freq: Four times a day (QID) | ORAL | 0 refills | Status: AC

## 2019-03-17 MED ORDER — DOXYCYCLINE HYCLATE 100 MG PO CAPS: 100.0000 mg | ORAL_CAPSULE | Freq: Two times a day (BID) | ORAL | 0 refills | Status: AC

## 2019-03-19 ENCOUNTER — Telehealth: Payer: Self-pay | Admitting: Gastroenterology

## 2019-03-19 NOTE — Telephone Encounter (Signed)
Patient notified that the pepto bismol will turn the stools black

## 2019-03-20 DIAGNOSIS — C4441 Basal cell carcinoma of skin of scalp and neck: Secondary | ICD-10-CM | POA: Diagnosis not present

## 2019-03-20 DIAGNOSIS — L821 Other seborrheic keratosis: Secondary | ICD-10-CM | POA: Diagnosis not present

## 2019-03-20 DIAGNOSIS — Z85828 Personal history of other malignant neoplasm of skin: Secondary | ICD-10-CM | POA: Diagnosis not present

## 2019-03-20 DIAGNOSIS — D485 Neoplasm of uncertain behavior of skin: Secondary | ICD-10-CM | POA: Diagnosis not present

## 2019-03-20 DIAGNOSIS — D1801 Hemangioma of skin and subcutaneous tissue: Secondary | ICD-10-CM | POA: Diagnosis not present

## 2019-03-23 ENCOUNTER — Other Ambulatory Visit: Payer: Self-pay

## 2019-03-23 ENCOUNTER — Ambulatory Visit (INDEPENDENT_AMBULATORY_CARE_PROVIDER_SITE_OTHER): Payer: Medicare Other | Admitting: Internal Medicine

## 2019-03-23 ENCOUNTER — Encounter: Payer: Self-pay | Admitting: Internal Medicine

## 2019-03-23 DIAGNOSIS — I2510 Atherosclerotic heart disease of native coronary artery without angina pectoris: Secondary | ICD-10-CM

## 2019-03-23 DIAGNOSIS — K2990 Gastroduodenitis, unspecified, without bleeding: Secondary | ICD-10-CM | POA: Diagnosis not present

## 2019-03-23 DIAGNOSIS — F411 Generalized anxiety disorder: Secondary | ICD-10-CM

## 2019-03-23 DIAGNOSIS — E785 Hyperlipidemia, unspecified: Secondary | ICD-10-CM

## 2019-03-23 DIAGNOSIS — K529 Noninfective gastroenteritis and colitis, unspecified: Secondary | ICD-10-CM | POA: Insufficient documentation

## 2019-03-23 NOTE — Patient Instructions (Addendum)
Take Align one daily x 1 month after antibiotics

## 2019-03-23 NOTE — Assessment & Plan Note (Signed)
Declined statins. 

## 2019-03-23 NOTE — Progress Notes (Signed)
Subjective:  Patient ID: Julie Montgomery, female    DOB: 11/16/1935  Age: 83 y.o. MRN: [id number]  CC: No chief complaint on file.   HPI LORETTA GETZ presents for wt loss, abd discomfort, anxiety Taking meds for H pylori gastritis --  Bismuth subsalicylate [id number] mg po qid, 14d  Metronidazole 250 mg po qid, 14d  Doxycycline 100 mg po bid, 14d  Increase pantoprazole to 40 mg po bid, 14d, then resume qd   Outpatient Medications Prior to Visit  Medication Sig Dispense Refill   acetaminophen (TYLENOL) 500 MG tablet Take 500 mg by mouth every 6 (six) hours as needed for mild pain.      Ascorbic Acid (VITAMIN C PO) Take by mouth.     aspirin (ASPIRIN 81) 81 MG EC tablet Take 81 mg by mouth daily. Swallow whole.     bismuth subsalicylate (PEPTO-BISMOL) 262 MG chewable tablet Chew 2 tablets (524 mg total) by mouth 4 (four) times daily for 14 days. 112 tablet 0   Cholecalciferol (VITAMIN D3) 1000 UNITS CAPS Take 1 capsule by mouth daily. Take total of 2 daily     dicyclomine (BENTYL) 10 MG capsule Take 1 capsule (10 mg total) by mouth 3 (three) times daily before meals. 90 capsule 11   doxycycline (VIBRAMYCIN) 100 MG capsule Take 1 capsule (100 mg total) by mouth 2 (two) times daily for 14 days. 28 capsule 0   escitalopram (LEXAPRO) 5 MG tablet Take 1 tablet (5 mg total) by mouth daily. 30 tablet 5   LORazepam (ATIVAN) 0.5 MG tablet Take 1 tablet (0.5 mg total) by mouth 2 (two) times daily as needed for anxiety. 30 tablet 2   meclizine (ANTIVERT) 12.5 MG tablet Take 1 tablet (12.5 mg total) by mouth 3 (three) times daily as needed for dizziness. 60 tablet 1   metroNIDAZOLE (FLAGYL) 250 MG tablet Take 1 tablet (250 mg total) by mouth 4 (four) times daily for 14 days. 56 tablet 0   pantoprazole (PROTONIX) 40 MG tablet Take 1 tablet (40 mg total) by mouth 2 (two) times daily. 28 tablet 3   pantoprazole (PROTONIX) 40 MG tablet Take 1 tablet (40 mg total) by mouth daily. 30 tablet 11    No facility-administered medications prior to visit.     ROS: Review of Systems  Constitutional: Positive for fatigue and unexpected weight change. Negative for activity change, appetite change and chills.  HENT: Negative for congestion, mouth sores and sinus pressure.   Eyes: Negative for visual disturbance.  Respiratory: Negative for cough and chest tightness.   Gastrointestinal: Positive for abdominal pain. Negative for nausea.  Genitourinary: Negative for difficulty urinating, frequency and vaginal pain.  Musculoskeletal: Negative for back pain and gait problem.  Skin: Negative for pallor and rash.  Neurological: Negative for dizziness, tremors, weakness, numbness and headaches.  Psychiatric/Behavioral: Negative for confusion, sleep disturbance and suicidal ideas. The patient is nervous/anxious.     Objective:  BP 120/64 (BP Location: Left Arm, Patient Position: Sitting, Cuff Size: Normal)    Pulse 65    Temp 98.3 F (36.8 C) (Oral)    Ht 5\' 3"  (1.6 m)    Wt 91 lb (41.3 kg)    SpO2 96%    BMI 16.12 kg/m   BP Readings from Last 3 Encounters:  03/23/19 120/64  03/05/19 99/62  02/25/19 122/64    Wt Readings from Last 3 Encounters:  03/23/19 91 lb (41.3 kg)  03/05/19 91 lb (41.3 kg)  02/25/19  91 lb (41.3 kg)    Physical Exam Constitutional:      General: She is not in acute distress.    Appearance: She is well-developed.  HENT:     Head: Normocephalic.     Right Ear: External ear normal.     Left Ear: External ear normal.     Nose: Nose normal.  Eyes:     General:        Right eye: No discharge.        Left eye: No discharge.     Conjunctiva/sclera: Conjunctivae normal.     Pupils: Pupils are equal, round, and reactive to light.  Neck:     Musculoskeletal: Normal range of motion and neck supple.     Thyroid: No thyromegaly.     Vascular: No JVD.     Trachea: No tracheal deviation.  Cardiovascular:     Rate and Rhythm: Normal rate and regular rhythm.      Heart sounds: Normal heart sounds.  Pulmonary:     Effort: No respiratory distress.     Breath sounds: No stridor. No wheezing.  Abdominal:     General: Bowel sounds are normal. There is no distension.     Palpations: Abdomen is soft. There is no mass.     Tenderness: There is no abdominal tenderness. There is no guarding or rebound.  Musculoskeletal:        General: No tenderness.  Lymphadenopathy:     Cervical: No cervical adenopathy.  Skin:    Findings: No erythema or rash.  Neurological:     Mental Status: She is oriented to person, place, and time.     Cranial Nerves: No cranial nerve deficit.     Motor: No abnormal muscle tone.     Coordination: Coordination normal.     Deep Tendon Reflexes: Reflexes normal.  Psychiatric:        Behavior: Behavior normal.        Thought Content: Thought content normal.        Judgment: Judgment normal.   Thin  Lab Results  Component Value Date   WBC 6.5 02/25/2019   HGB 13.5 02/25/2019   HCT 40.3 02/25/2019   PLT 209.0 02/25/2019   GLUCOSE 86 02/25/2019   CHOL 205 (H) 10/13/2018   TRIG 71.0 10/13/2018   HDL 65.40 10/13/2018   LDLDIRECT 145.7 05/12/2013   LDLCALC 125 (H) 10/13/2018   ALT 19 02/25/2019   AST 20 02/25/2019   NA 137 02/25/2019   K 4.0 02/25/2019   CL 103 02/25/2019   CREATININE 0.67 02/25/2019   BUN 20 02/25/2019   CO2 29 02/25/2019   TSH 4.05 10/13/2018    Ct Abdomen Pelvis W Contrast  Result Date: 03/03/2019 CLINICAL DATA:  Intermittent diarrhea with loss of appetite and lower abdominal pain. Prior cholecystectomy and appendectomy. EXAM: CT ABDOMEN AND PELVIS WITH CONTRAST TECHNIQUE: Multidetector CT imaging of the abdomen and pelvis was performed using the standard protocol following bolus administration of intravenous contrast. CONTRAST:  144mL OMNIPAQUE IOHEXOL 300 MG/ML  SOLN COMPARISON:  09/09/2014 FINDINGS: Lower chest: Peripheral clustered micro nodularity noted in the lung bases, right greater than  left. This is associated with small airway impaction and a tree-in-bud configuration in some areas. Imaging features suggest atypical infection. Hepatobiliary: No suspicious focal abnormality within the liver parenchyma. Gallbladder surgically absent. Mild prominence of the extrahepatic biliary ducts is similar to prior. Common bile duct measures 5 mm diameter in the head of pancreas. Pancreas: No  focal mass lesion. No dilatation of the main duct. No intraparenchymal cyst. No peripancreatic edema. Spleen: No splenomegaly. No focal mass lesion. Adrenals/Urinary Tract: No adrenal nodule or mass. Right kidney unremarkable. 8 mm hypoattenuating lesion in the interpolar left kidney with 6 mm previously, compatible with benign etiology such as cyst. No evidence for hydroureter. The urinary bladder appears normal for the degree of distention. Stomach/Bowel: Stomach is decompressed, likely accentuating proximal wall thickness. Duodenum is normally positioned as is the ligament of Treitz. No small bowel wall thickening. No small bowel dilatation. Terminal ileum unremarkable. Nonvisualization of the appendix is consistent with the reported history of appendectomy. The cecum is markedly distended with stool and contrast material measuring up to 9.1 cm diameter. The ascending and transverse colon remains distended with stool and air. There is a focal area of circumferential wall thickening and luminal narrowing in the splenic flexure (see axial image 13/series 2 and sagittal image 76/series 5. Left colon is more decompressed and oral contrast material has migrated through to the level of the rectum. Diverticular changes are noted in the left colon without evidence of diverticulitis. Period Vascular/Lymphatic: 1.5 cm rim calcified lesion in the splenic hilum is compatible with distal splenic artery aneurysm. This is stable in the interval since prior study and shows no central enhancement, likely secondary to thrombosis. There  is abdominal aortic atherosclerosis without aneurysm. There is no gastrohepatic or hepatoduodenal ligament lymphadenopathy. No intraperitoneal or retroperitoneal lymphadenopathy. No pelvic sidewall lymphadenopathy. Reproductive: Uterus appears to be surgically absent, but mass-effect from the distended cecum hinders assessment. There is no adnexal mass. Other: No intraperitoneal free fluid. Musculoskeletal: No worrisome lytic or sclerotic osseous abnormality. Degenerative changes noted in the hips. IMPRESSION: 1. Marked distention of the cecum, ascending colon and transverse colon with stool and air. There is a focal area of circumferential wall thickening and luminal narrowing in the splenic flexure. While this may be related to peristalsis, given the proximal colonic distention and lack of other similar areas of wall thickening/peristalsis, neoplasm is a concern. Correlation with colorectal cancer screening history recommended and colonoscopy may be warranted. 2. 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 3. Left colonic diverticulosis without diverticulitis. 4. Peripheral clustered micro nodularity in the lung bases, right greater than left, associated with small airway impaction and tree-in-bud configuration. Imaging features most suggest atypical infection. 5. Aortic Atherosclerosis (ICD10-I70.0). These results will be called to the ordering clinician or representative by the Radiologist Assistant, and communication documented in the PACS or zVision Dashboard. Electronically Signed   By: Misty Stanley M.D.   On: 03/03/2019 09:15    Assessment & Plan:   There are no diagnoses linked to this encounter.   No orders of the defined types were placed in this encounter.    Follow-up: No follow-ups on file.  Walker Kehr, MD

## 2019-03-23 NOTE — Assessment & Plan Note (Signed)
Taking meds for H pylori gastritis 11/20  --  Bismuth subsalicylate [id number] mg po qid, 14d  Metronidazole 250 mg po qid, 14d  Doxycycline 100 mg po bid, 14d  Increase pantoprazole to 40 mg po bid, 14d, then resume qd

## 2019-03-23 NOTE — Assessment & Plan Note (Signed)
Alprazolam prn - rare

## 2019-03-23 NOTE — Assessment & Plan Note (Signed)
Red Rice Yeast extract to try later

## 2019-04-02 DIAGNOSIS — H5213 Myopia, bilateral: Secondary | ICD-10-CM | POA: Diagnosis not present

## 2019-04-02 DIAGNOSIS — H04123 Dry eye syndrome of bilateral lacrimal glands: Secondary | ICD-10-CM | POA: Diagnosis not present

## 2019-04-02 DIAGNOSIS — H532 Diplopia: Secondary | ICD-10-CM | POA: Diagnosis not present

## 2019-04-02 DIAGNOSIS — H43813 Vitreous degeneration, bilateral: Secondary | ICD-10-CM | POA: Diagnosis not present

## 2019-05-11 DIAGNOSIS — C4441 Basal cell carcinoma of skin of scalp and neck: Secondary | ICD-10-CM | POA: Diagnosis not present

## 2019-05-11 DIAGNOSIS — Z85828 Personal history of other malignant neoplasm of skin: Secondary | ICD-10-CM | POA: Diagnosis not present

## 2019-05-29 ENCOUNTER — Ambulatory Visit: Payer: Self-pay

## 2019-06-06 ENCOUNTER — Ambulatory Visit: Payer: Medicare Other | Attending: Internal Medicine

## 2019-06-06 DIAGNOSIS — Z23 Encounter for immunization: Secondary | ICD-10-CM | POA: Insufficient documentation

## 2019-06-06 NOTE — Progress Notes (Signed)
   Covid-19 Vaccination Clinic  Name:  Julie Montgomery    MRN: [id number] DOB: 17-Feb-1936  06/06/2019  Julie Montgomery was observed post Covid-19 immunization for 15 minutes without incidence. She was provided with Vaccine Information Sheet and instruction to access the V-Safe system.   Julie Montgomery was instructed to call 911 with any severe reactions post vaccine: Marland Kitchen Difficulty breathing  . Swelling of your face and throat  . A fast heartbeat  . A bad rash all over your body  . Dizziness and weakness    Immunizations Administered    Name Date Dose VIS Date Route   Pfizer COVID-19 Vaccine 06/06/2019  5:24 PM 0.3 mL 04/10/2019 Intramuscular   Manufacturer: [place]   Lot: [id number]   [place]: [id number]

## 2019-06-08 DIAGNOSIS — Z85828 Personal history of other malignant neoplasm of skin: Secondary | ICD-10-CM | POA: Diagnosis not present

## 2019-06-08 DIAGNOSIS — C4441 Basal cell carcinoma of skin of scalp and neck: Secondary | ICD-10-CM | POA: Diagnosis not present

## 2019-07-01 ENCOUNTER — Ambulatory Visit: Payer: Medicare Other | Attending: Internal Medicine

## 2019-07-01 DIAGNOSIS — Z23 Encounter for immunization: Secondary | ICD-10-CM | POA: Insufficient documentation

## 2019-07-01 NOTE — Progress Notes (Signed)
   Covid-19 Vaccination Clinic  Name:  RHYLEE SNEDDON    MRN: [id number] DOB: Aug 11, 1935  07/01/2019  Ms. Choquette was observed post Covid-19 immunization for 30 minutes based on pre-vaccination screening without incident. She was provided with Vaccine Information Sheet and instruction to access the V-Safe system.   Ms. Peterman was instructed to call 911 with any severe reactions post vaccine: Marland Kitchen Difficulty breathing  . Swelling of face and throat  . A fast heartbeat  . A bad rash all over body  . Dizziness and weakness   Immunizations Administered    Name Date Dose VIS Date Route   Pfizer COVID-19 Vaccine 07/01/2019  2:19 PM 0.3 mL 04/10/2019 Intramuscular   Manufacturer: [place]   Lot: [id number]   [place]: [id number]

## 2019-07-02 ENCOUNTER — Other Ambulatory Visit: Payer: Self-pay

## 2019-07-02 ENCOUNTER — Emergency Department (HOSPITAL_COMMUNITY)
Admission: EM | Admit: 2019-07-02 | Discharge: 2019-07-02 | Disposition: A | Discharge status: Home / Self Care | Payer: Medicare Other | Attending: Emergency Medicine | Admitting: Emergency Medicine

## 2019-07-02 ENCOUNTER — Encounter (HOSPITAL_COMMUNITY): Payer: Self-pay | Admitting: Emergency Medicine

## 2019-07-02 ENCOUNTER — Emergency Department (HOSPITAL_COMMUNITY): Payer: Medicare Other

## 2019-07-02 DIAGNOSIS — I10 Essential (primary) hypertension: Secondary | ICD-10-CM | POA: Diagnosis not present

## 2019-07-02 DIAGNOSIS — R197 Diarrhea, unspecified: Secondary | ICD-10-CM | POA: Diagnosis not present

## 2019-07-02 DIAGNOSIS — H8110 Benign paroxysmal vertigo, unspecified ear: Secondary | ICD-10-CM | POA: Diagnosis not present

## 2019-07-02 DIAGNOSIS — R110 Nausea: Secondary | ICD-10-CM | POA: Insufficient documentation

## 2019-07-02 DIAGNOSIS — H8112 Benign paroxysmal vertigo, left ear: Secondary | ICD-10-CM | POA: Diagnosis not present

## 2019-07-02 DIAGNOSIS — R519 Headache, unspecified: Secondary | ICD-10-CM | POA: Diagnosis not present

## 2019-07-02 DIAGNOSIS — R55 Syncope and collapse: Secondary | ICD-10-CM | POA: Diagnosis not present

## 2019-07-02 DIAGNOSIS — Z8673 Personal history of transient ischemic attack (TIA), and cerebral infarction without residual deficits: Secondary | ICD-10-CM | POA: Insufficient documentation

## 2019-07-02 DIAGNOSIS — R42 Dizziness and giddiness: Secondary | ICD-10-CM | POA: Diagnosis not present

## 2019-07-02 DIAGNOSIS — Z79899 Other long term (current) drug therapy: Secondary | ICD-10-CM | POA: Insufficient documentation

## 2019-07-02 DIAGNOSIS — I959 Hypotension, unspecified: Secondary | ICD-10-CM | POA: Diagnosis not present

## 2019-07-02 LAB — BASIC METABOLIC PANEL
Anion gap: 10 (ref 5–15)
BUN: 16 mg/dL (ref 8–23)
CO2: 23 mmol/L (ref 22–32)
Calcium: 8.2 mg/dL — ABNORMAL LOW (ref 8.9–10.3)
Chloride: 105 mmol/L (ref 98–111)
Creatinine, Ser: 0.58 mg/dL (ref 0.44–1.00)
GFR calc Af Amer: >60 mL/min (ref >60)
GFR calc non Af Amer: >60 mL/min (ref >60)
Glucose, Bld: 94 mg/dL (ref 70–99)
Potassium: 3.9 mmol/L (ref 3.5–5.1)
Sodium: 138 mmol/L (ref 135–145)

## 2019-07-02 LAB — CBG MONITORING, ED: Glucose-Capillary: 105 mg/dL — ABNORMAL HIGH (ref 70–99)

## 2019-07-02 LAB — CBC WITH DIFFERENTIAL/PLATELET
Abs Immature Granulocytes: 0.01 10*3/uL (ref 0.00–0.07)
Basophils Absolute: 0 10*3/uL (ref 0.0–0.1)
Basophils Relative: 0 %
Eosinophils Absolute: 0 10*3/uL (ref 0.0–0.5)
Eosinophils Relative: 0 %
HCT: 37.8 % (ref 36.0–46.0)
Hemoglobin: 12.1 g/dL (ref 12.0–15.0)
Immature Granulocytes: 0 %
Lymphocytes Relative: 12 %
Lymphs Abs: 0.5 10*3/uL — ABNORMAL LOW (ref 0.7–4.0)
MCH: 31.5 pg (ref 26.0–34.0)
MCHC: 32 g/dL (ref 30.0–36.0)
MCV: 98.4 fL (ref 80.0–100.0)
Monocytes Absolute: 0.4 10*3/uL (ref 0.1–1.0)
Monocytes Relative: 10 %
Neutro Abs: 3.1 10*3/uL (ref 1.7–7.7)
Neutrophils Relative %: 78 %
Platelets: DECREASED 10*3/uL (ref 150–400)
RBC: 3.84 MIL/uL — ABNORMAL LOW (ref 3.87–5.11)
RDW: 13.3 % (ref 11.5–15.5)
WBC: 4 10*3/uL (ref 4.0–10.5)
nRBC: 0 % (ref 0.0–0.2)

## 2019-07-02 LAB — URINALYSIS, ROUTINE W REFLEX MICROSCOPIC
Bilirubin Urine: NEGATIVE
Glucose, UA: NEGATIVE mg/dL
Hgb urine dipstick: NEGATIVE
Ketones, ur: 5 mg/dL — AB
Leukocytes,Ua: NEGATIVE
Nitrite: NEGATIVE
Protein, ur: NEGATIVE mg/dL
Specific Gravity, Urine: 1.014 (ref 1.005–1.030)
pH: 6 (ref 5.0–8.0)

## 2019-07-02 LAB — TROPONIN I (HIGH SENSITIVITY)
Troponin I (High Sensitivity): 4 ng/L (ref <18)
Troponin I (High Sensitivity): 6 ng/L (ref <18)

## 2019-07-02 MED ORDER — MECLIZINE HCL 25 MG PO TABS
25.0000 mg | ORAL_TABLET | Freq: Once | ORAL | Status: AC
  Administered 2019-07-02: 14:00:00 25 mg via ORAL
  Filled 2019-07-02: qty 1

## 2019-07-02 MED ORDER — MECLIZINE HCL 12.5 MG PO TABS: 12.5000 mg | ORAL_TABLET | Freq: Three times a day (TID) | ORAL | 0 refills | Status: DC | PRN

## 2019-07-02 NOTE — ED Provider Notes (Signed)
Medical Decision Making: Care of patient assumed from Domenic Moras PA-C at 1500.  Agree with history, physical exam and plan.  See their note for further details.  Briefly, 84 y.o. female with PMH/PSH as below.  Past Medical History:  Diagnosis Date  . Allergy   . Anxiety   . Cataract   . Depression   . Osteopenia   . Pneumonia    hx of 03/2008  . Stroke [facility])    TIA long time ago   Past Surgical History:  Procedure Laterality Date  . APPENDECTOMY    . BREAST LUMPECTOMY     right  . CATARACT EXTRACTION W/ INTRAOCULAR LENS  IMPLANT, BILATERAL Bilateral   . CESAREAN SECTION    . CHOLECYSTECTOMY    . PARTIAL HYSTERECTOMY    . ROTATOR CUFF REPAIR  2012  . teeth remmoved       Patient HDS at handoff.  Plan at time of handoff:   Dizziness and near-syncope today.  Flushing prior to near syncope.  EKG looks good.  Orthostatics normal.  Feeling much better after meclizine.   Follow up CT, if normal will go home with meclizine.  ED Course CT head without acute intracranial abnormality.  Rechecked pt who is resting comfortably.  Pt reports the vertigo she had today is similar to previous episodes for which she had been prescribed meclizine by her PCP in the past.  Pt able to ambulate in the ED, will discharge home with meclizine and follow up with PCP.  Strict return precautions given, discharged home in stable condition.    Clinical Impression 1. Benign paroxysmal positional vertigo, unspecified laterality   2. Syncope, unspecified syncope type        Tyquez Hollibaugh, [place], MD 07/03/19 1505    Davonna Belling, MD 07/03/19 [phone]
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
## 2019-07-02 NOTE — ED Triage Notes (Signed)
Pt arrives via EMS from home. Pt had second covid shot yesterday. This morning, pt got up, felt fine and had breakfast. Pt had an episode of diarrhea after breakfast. Pt felt dizzy and laid down. Pt passed out once she was on the floor. Pt has a red spot on head that is from skin ca, did not hit head. Pt alert and oriented when EMS arrived. BP 112/80 laying, dropped to 80 systolic when sitting up. HR 80 SR. CBG 151.

## 2019-07-02 NOTE — ED Notes (Signed)
Pt ambulated in room with standby assistance. Pt had to hold forehead to walk. Pt said that is normal for her to do that. She stated, "I do not feel nauseous just cant make fast movements." Pt stated, "I just want to go home and take a shower."

## 2019-07-02 NOTE — ED Notes (Signed)
Pt complaining of central back pain. States she had an episode of this pain last night.

## 2019-07-02 NOTE — ED Notes (Signed)
Pt able to ambulate in hall with no assistance.

## 2019-07-02 NOTE — Discharge Instructions (Addendum)
You have been evaluated for your symptoms.  Fortunately your labs and EKG are normal today.  Your head CT scan did not show any concerning feature.  You have received anti-vertigo medication.  Call your doctor tomorrow for follow up.
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
## 2019-07-20 ENCOUNTER — Ambulatory Visit: Payer: Medicare Other | Admitting: Internal Medicine

## 2019-07-23 ENCOUNTER — Encounter: Payer: Self-pay | Admitting: Internal Medicine

## 2019-07-23 ENCOUNTER — Other Ambulatory Visit: Payer: Self-pay

## 2019-07-23 ENCOUNTER — Ambulatory Visit (INDEPENDENT_AMBULATORY_CARE_PROVIDER_SITE_OTHER): Payer: Medicare Other | Admitting: Internal Medicine

## 2019-07-23 VITALS — BP 144/72 | HR 91 | Temp 97.6°F | Ht 63.0 in | Wt 87.6 lb

## 2019-07-23 DIAGNOSIS — R634 Abnormal weight loss: Secondary | ICD-10-CM | POA: Diagnosis not present

## 2019-07-23 DIAGNOSIS — K589 Irritable bowel syndrome without diarrhea: Secondary | ICD-10-CM | POA: Diagnosis not present

## 2019-07-23 DIAGNOSIS — F411 Generalized anxiety disorder: Secondary | ICD-10-CM

## 2019-07-23 DIAGNOSIS — E441 Mild protein-calorie malnutrition: Secondary | ICD-10-CM

## 2019-07-23 LAB — BASIC METABOLIC PANEL
BUN: 20 mg/dL (ref 6–23)
CO2: 29 mEq/L (ref 19–32)
Calcium: 9.6 mg/dL (ref 8.4–10.5)
Chloride: 103 mEq/L (ref 96–112)
Creatinine, Ser: 0.62 mg/dL (ref 0.40–1.20)
GFR: 92.3 mL/min (ref >60.00)
Glucose, Bld: 87 mg/dL (ref 70–99)
Potassium: 4.5 mEq/L (ref 3.5–5.1)
Sodium: 135 mEq/L (ref 135–145)

## 2019-07-23 LAB — CBC WITH DIFFERENTIAL/PLATELET
Basophils Absolute: 0 10*3/uL (ref 0.0–0.1)
Basophils Relative: 0.5 % (ref 0.0–3.0)
Eosinophils Absolute: 0.1 10*3/uL (ref 0.0–0.7)
Eosinophils Relative: 0.7 % (ref 0.0–5.0)
HCT: 36.5 % (ref 36.0–46.0)
Hemoglobin: 12.6 g/dL (ref 12.0–15.0)
Lymphocytes Relative: 18.7 % (ref 12.0–46.0)
Lymphs Abs: 1.5 10*3/uL (ref 0.7–4.0)
MCHC: 34.4 g/dL (ref 30.0–36.0)
MCV: 94.4 fl (ref 78.0–100.0)
Monocytes Absolute: 0.6 10*3/uL (ref 0.1–1.0)
Monocytes Relative: 7.4 % (ref 3.0–12.0)
Neutro Abs: 5.7 10*3/uL (ref 1.4–7.7)
Neutrophils Relative %: 72.7 % (ref 43.0–77.0)
Platelets: 272 10*3/uL (ref 150.0–400.0)
RBC: 3.87 Mil/uL (ref 3.87–5.11)
RDW: 13.4 % (ref 11.5–15.5)
WBC: 7.8 10*3/uL (ref 4.0–10.5)

## 2019-07-23 LAB — HEPATIC FUNCTION PANEL
ALT: 18 U/L (ref 0–35)
AST: 19 U/L (ref 0–37)
Albumin: 4.1 g/dL (ref 3.5–5.2)
Alkaline Phosphatase: 111 U/L (ref 39–117)
Bilirubin, Direct: 0.1 mg/dL (ref 0.0–0.3)
Total Bilirubin: 0.4 mg/dL (ref 0.2–1.2)
Total Protein: 7 g/dL (ref 6.0–8.3)

## 2019-07-23 LAB — TSH: TSH: 4.07 u[IU]/mL (ref 0.35–4.50)

## 2019-07-23 NOTE — Progress Notes (Signed)
Subjective:  Patient ID: Julie Montgomery, female    DOB: [birth year]  Age: 84 y.o. MRN: [id number]  CC: No chief complaint on file.   HPI SHANISE KLAUER presents for wt loss - worse, anxiety, diarrhea - stopped Skin cancer - 5FU cream on the forehead Now gained 2 lbs back  Outpatient Medications Prior to Visit  Medication Sig Dispense Refill  . acetaminophen (TYLENOL) 500 MG tablet Take 500 mg by mouth every 6 (six) hours as needed for mild pain.     . Ascorbic Acid (VITAMIN C PO) Take by mouth.    Marland Kitchen aspirin (ASPIRIN 81) 81 MG EC tablet Take 81 mg by mouth daily. Swallow whole.    . Cholecalciferol (VITAMIN D3) 1000 UNITS CAPS Take 1 capsule by mouth daily. Take total of 2 daily    . dicyclomine (BENTYL) 10 MG capsule Take 1 capsule (10 mg total) by mouth 3 (three) times daily before meals. 90 capsule 11  . escitalopram (LEXAPRO) 5 MG tablet Take 1 tablet (5 mg total) by mouth daily. 30 tablet 5  . LORazepam (ATIVAN) 0.5 MG tablet Take 1 tablet (0.5 mg total) by mouth 2 (two) times daily as needed for anxiety. 30 tablet 2  . meclizine (ANTIVERT) 12.5 MG tablet Take 1 tablet (12.5 mg total) by mouth 3 (three) times daily as needed for dizziness. 12 tablet 0  . pantoprazole (PROTONIX) 40 MG tablet Take 1 tablet (40 mg total) by mouth 2 (two) times daily. 28 tablet 3   No facility-administered medications prior to visit.    ROS: Review of Systems  Constitutional: Positive for unexpected weight change. Negative for activity change, appetite change, chills and fatigue.  HENT: Negative for congestion, mouth sores and sinus pressure.   Eyes: Negative for visual disturbance.  Respiratory: Negative for cough and chest tightness.   Gastrointestinal: Negative for abdominal pain and nausea.  Genitourinary: Negative for difficulty urinating, frequency and vaginal pain.  Musculoskeletal: Negative for back pain and gait problem.  Skin: Negative for pallor and rash.  Neurological: Negative for  dizziness, tremors, weakness, numbness and headaches.  Psychiatric/Behavioral: Negative for confusion, sleep disturbance and suicidal ideas.    Objective:  BP (!) 144/72 (BP Location: Left Arm, Patient Position: Sitting, Cuff Size: Normal)   Pulse 91   Temp 97.6 F (36.4 C) (Oral)   Ht 5\' 3"  (1.6 m)   Wt 87 lb 9.6 oz (39.7 kg)   SpO2 96%   BMI 15.52 kg/m   BP Readings from Last 3 Encounters:  07/23/19 (!) 144/72  07/02/19 122/65  03/23/19 120/64    Wt Readings from Last 3 Encounters:  07/23/19 87 lb 9.6 oz (39.7 kg)  03/23/19 91 lb (41.3 kg)  03/05/19 91 lb (41.3 kg)    Physical Exam Constitutional:      General: She is not in acute distress.    Appearance: She is well-developed.  HENT:     Head: Normocephalic.     Right Ear: External ear normal.     Left Ear: External ear normal.     Nose: Nose normal.  Eyes:     General:        Right eye: No discharge.        Left eye: No discharge.     Conjunctiva/sclera: Conjunctivae normal.     Pupils: Pupils are equal, round, and reactive to light.  Neck:     Thyroid: No thyromegaly.     Vascular: No JVD.  Trachea: No tracheal deviation.  Cardiovascular:     Rate and Rhythm: Normal rate and regular rhythm.     Heart sounds: Normal heart sounds.  Pulmonary:     Effort: No respiratory distress.     Breath sounds: No stridor. No wheezing.  Abdominal:     General: Bowel sounds are normal. There is no distension.     Palpations: Abdomen is soft. There is no mass.     Tenderness: There is no abdominal tenderness. There is no guarding or rebound.  Musculoskeletal:        General: No tenderness.     Cervical back: Normal range of motion and neck supple.  Lymphadenopathy:     Cervical: No cervical adenopathy.  Skin:    Findings: No erythema or rash.  Neurological:     Cranial Nerves: No cranial nerve deficit.     Motor: No abnormal muscle tone.     Coordination: Coordination normal.     Deep Tendon Reflexes: Reflexes  normal.  Psychiatric:        Behavior: Behavior normal.        Thought Content: Thought content normal.        Judgment: Judgment normal.   thin L scalp patch of erythema/scab  Lab Results  Component Value Date   WBC 4.0 07/02/2019   HGB 12.1 07/02/2019   HCT 37.8 07/02/2019   PLT  07/02/2019    PLATELET CLUMPS NOTED ON SMEAR, COUNT APPEARS DECREASED   GLUCOSE 94 07/02/2019   CHOL 205 (H) 10/13/2018   TRIG 71.0 10/13/2018   HDL 65.40 10/13/2018   LDLDIRECT 145.7 05/12/2013   LDLCALC 125 (H) 10/13/2018   ALT 19 02/25/2019   AST 20 02/25/2019   NA 138 07/02/2019   K 3.9 07/02/2019   CL 105 07/02/2019   CREATININE 0.58 07/02/2019   BUN 16 07/02/2019   CO2 23 07/02/2019   TSH 4.05 10/13/2018    CT Head Wo Contrast  Result Date: 07/02/2019 CLINICAL DATA:  Dizziness and headache. EXAM: CT HEAD WITHOUT CONTRAST TECHNIQUE: Contiguous axial images were obtained from the base of the skull through the vertex without intravenous contrast. COMPARISON:  Brain MRI 04/25/2014 FINDINGS: Brain: No intracranial hemorrhage, mass effect, or midline shift. Brain volume is normal for age. No hydrocephalus. The basilar cisterns are patent. Mild to moderate chronic small vessel ischemia again seen. No evidence of territorial infarct or acute ischemia. No extra-axial or intracranial fluid collection. Vascular: Atherosclerosis of skullbase vasculature without hyperdense vessel or abnormal calcification. Skull: No fracture or focal lesion. Sinuses/Orbits: Minimal mucosal thickening in left side of sphenoid sinus. Paranasal sinuses are otherwise clear. No mastoid effusion. Bilateral lens extraction Other: None. IMPRESSION: No acute intracranial abnormality. Age related atrophy with stable chronic small vessel ischemia. Electronically Signed   By: Keith Rake M.D.   On: 07/02/2019 15:25    Assessment & Plan:    Walker Kehr, MD

## 2019-07-23 NOTE — Patient Instructions (Signed)
Wt Readings from Last 3 Encounters:  07/23/19 87 lb 9.6 oz (39.7 kg)  03/23/19 91 lb (41.3 kg)  03/05/19 91 lb (41.3 kg)

## 2019-07-24 ENCOUNTER — Encounter: Payer: Self-pay | Admitting: Internal Medicine

## 2019-07-24 NOTE — Assessment & Plan Note (Signed)
Diarrhea has resolved

## 2019-07-24 NOTE — Assessment & Plan Note (Signed)
It seems to be better.  Not using lorazepam

## 2019-07-30 ENCOUNTER — Other Ambulatory Visit: Payer: Self-pay | Admitting: Internal Medicine

## 2019-07-30 MED ORDER — MECLIZINE HCL 12.5 MG PO TABS: 12.5000 mg | ORAL_TABLET | Freq: Three times a day (TID) | ORAL | 1 refills | Status: DC | PRN

## 2019-08-03 DIAGNOSIS — L570 Actinic keratosis: Secondary | ICD-10-CM | POA: Diagnosis not present

## 2019-08-03 DIAGNOSIS — Z85828 Personal history of other malignant neoplasm of skin: Secondary | ICD-10-CM | POA: Diagnosis not present

## 2019-08-03 DIAGNOSIS — C4441 Basal cell carcinoma of skin of scalp and neck: Secondary | ICD-10-CM | POA: Diagnosis not present

## 2019-08-20 DIAGNOSIS — Z85828 Personal history of other malignant neoplasm of skin: Secondary | ICD-10-CM | POA: Diagnosis not present

## 2019-08-20 DIAGNOSIS — C4441 Basal cell carcinoma of skin of scalp and neck: Secondary | ICD-10-CM | POA: Diagnosis not present

## 2019-09-11 DIAGNOSIS — H903 Sensorineural hearing loss, bilateral: Secondary | ICD-10-CM | POA: Diagnosis not present

## 2019-09-11 DIAGNOSIS — H6123 Impacted cerumen, bilateral: Secondary | ICD-10-CM | POA: Diagnosis not present

## 2019-09-11 DIAGNOSIS — Z882 Allergy status to sulfonamides status: Secondary | ICD-10-CM | POA: Diagnosis not present

## 2019-09-11 DIAGNOSIS — Z888 Allergy status to other drugs, medicaments and biological substances status: Secondary | ICD-10-CM | POA: Diagnosis not present

## 2019-09-14 ENCOUNTER — Encounter: Payer: Self-pay | Admitting: Gastroenterology

## 2019-09-14 ENCOUNTER — Ambulatory Visit (INDEPENDENT_AMBULATORY_CARE_PROVIDER_SITE_OTHER): Payer: Medicare Other | Admitting: Gastroenterology

## 2019-09-14 ENCOUNTER — Other Ambulatory Visit: Payer: Medicare Other

## 2019-09-14 VITALS — BP 130/60 | HR 68 | Temp 98.2°F | Ht 63.0 in | Wt 89.8 lb

## 2019-09-14 DIAGNOSIS — R634 Abnormal weight loss: Secondary | ICD-10-CM

## 2019-09-14 NOTE — Patient Instructions (Signed)
Your provider has requested that you go to the basement level for lab work before leaving today. Press "B" on the elevator. The lab is located at the first door on the left as you exit the elevator.  Increase your calorie intake.   Due to recent changes in healthcare laws, you may see the results of your imaging and laboratory studies on MyChart before your provider has had a chance to review them.  We understand that in some cases there may be results that are confusing or concerning to you. Not all laboratory results come back in the same time frame and the provider may be waiting for multiple results in order to interpret others.  Please give [place] 48 hours in order for your provider to thoroughly review all the results before contacting the office for clarification of your results.   Thank you for choosing me and [facility].  Pricilla Riffle. Dagoberto Ligas., MD., Marval Regal

## 2019-09-14 NOTE — Progress Notes (Signed)
    History of Present Illness: This is an 84 year old female following up for weight loss, and adequate weight gain, poor appetite.  She felt that to pantoprazole was causing weight loss, decreased energy, confusion, appetite loss so she discontinued it. Recent basal cell cancer removed on left front scalp which was cured and is healing.  She has 3 small meals./day with one Boost in between meals.  She relates she has gained 1 pound over the past few weeks.   Colon 03/2019 - One 7 mm polyp in the ascending colon, removed with a cold snare. Resected and retrieved. - Moderate diverticulosis in the left colon. - Anal papilla(e) were hypertrophied. - The examination was otherwise normal on direct and retroflexion views.   EGD 03/2019 - Normal esophagus. - Gastritis. Biopsied. - Small hiatal hernia. - Normal duodenal bulb and second portion of the duodenum.  CT AP 03/2019 1. Marked distention of the cecum, ascending colon and transverse colon with stool and air. There is a focal area of circumferential wall thickening and luminal narrowing in the splenic flexure. While this may be related to peristalsis, given the proximal colonic distention and lack of other similar areas of wall thickening/peristalsis, neoplasm is a concern. Correlation with colorectal cancer screening history recommended and colonoscopy may be warranted. 2. 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 3. Left colonic diverticulosis without diverticulitis. 4. Peripheral clustered micro nodularity in the lung bases, right greater than left, associated with small airway impaction and tree-in-bud configuration. Imaging features most suggest atypical infection. 5. Aortic Atherosclerosis (ICD10-I70.0).  Current Medications, Allergies, Past Medical History, Past Surgical History, Family History and Social History were reviewed in [employer] record.   Physical  Exam: General: Well developed, well nourished, no acute distress Head: Normocephalic and atraumatic Eyes:  sclerae anicteric, EOMI Ears: Normal auditory acuity Mouth: Not examined, mask on during Covid-19 pandemic Lungs: Clear throughout to auscultation Heart: Regular rate and rhythm; no murmurs, rubs or bruits Abdomen: Soft, non tender and non distended. No masses, hepatosplenomegaly or hernias noted. Normal Bowel sounds Rectal: Musculoskeletal: Symmetrical with no gross deformities  Pulses:  Normal pulses noted Extremities: No clubbing, cyanosis, edema or deformities noted Neurological: Alert oriented x 4, grossly nonfocal Psychological:  Alert and cooperative. Normal mood and affect   Assessment and Recommendations:  1.  Weight loss.  Unable to gain weight rapidly.  She has had a thorough gastrointestinal evaluation.  I suspect that her calorie intake is insufficient. Increase daily calorie intake with frequent snacks. Check fecal eslatase which will complete her gastrointestinal evaluation.  If this is negative she may benefit from dietitian referral.  Return to Dr. Alain Marion as schedule on 7/1. GI follow up prn.

## 2019-09-17 ENCOUNTER — Other Ambulatory Visit: Payer: Medicare Other

## 2019-09-17 DIAGNOSIS — R634 Abnormal weight loss: Secondary | ICD-10-CM | POA: Diagnosis not present

## 2019-09-22 LAB — PANCREATIC ELASTASE, FECAL: Pancreatic Elastase-1, Stool: >500 mcg/g

## 2019-10-29 ENCOUNTER — Other Ambulatory Visit: Payer: Self-pay

## 2019-10-29 ENCOUNTER — Telehealth: Payer: Self-pay | Admitting: Internal Medicine

## 2019-10-29 ENCOUNTER — Encounter: Payer: Self-pay | Admitting: Internal Medicine

## 2019-10-29 ENCOUNTER — Ambulatory Visit (INDEPENDENT_AMBULATORY_CARE_PROVIDER_SITE_OTHER): Payer: Medicare Other | Admitting: Internal Medicine

## 2019-10-29 VITALS — BP 142/88 | HR 63 | Temp 97.9°F | Ht 63.0 in | Wt 89.0 lb

## 2019-10-29 DIAGNOSIS — R634 Abnormal weight loss: Secondary | ICD-10-CM

## 2019-10-29 DIAGNOSIS — K529 Noninfective gastroenteritis and colitis, unspecified: Secondary | ICD-10-CM

## 2019-10-29 DIAGNOSIS — R197 Diarrhea, unspecified: Secondary | ICD-10-CM | POA: Diagnosis not present

## 2019-10-29 DIAGNOSIS — R110 Nausea: Secondary | ICD-10-CM

## 2019-10-29 DIAGNOSIS — E860 Dehydration: Secondary | ICD-10-CM | POA: Diagnosis not present

## 2019-10-29 MED ORDER — ONDANSETRON HCL 4 MG PO TABS
4.0000 mg | ORAL_TABLET | Freq: Three times a day (TID) | ORAL | 0 refills | Status: DC | PRN
Start: 2019-10-29 — End: 2019-12-15

## 2019-10-29 MED ORDER — ONDANSETRON HCL 4 MG/2ML IJ SOLN
4.0000 mg | Freq: Once | INTRAMUSCULAR | Status: AC
  Administered 2019-10-29: 4 mg via INTRAMUSCULAR

## 2019-10-29 NOTE — Assessment & Plan Note (Signed)
Imodium prn Hydration PO To ER if worse

## 2019-10-29 NOTE — Telephone Encounter (Signed)
Patients husband called regarding his wifes appointment. Stated that she had been experiencing diarrhea, felt like she was burning on the inside and freezing on the outside, nauseated. Was transferred to team health.

## 2019-10-29 NOTE — Assessment & Plan Note (Signed)
Imodium prn Hydration To ER if worse

## 2019-10-29 NOTE — Assessment & Plan Note (Signed)
?  food poisoning Zofran IM/PO Imodium prn Hydration To ER if worse

## 2019-10-29 NOTE — Patient Instructions (Signed)
Imodium as needed Hydration To ER if worse  Diarrhea, Adult Diarrhea is when you pass loose and watery poop (stool) often. Diarrhea can make you feel weak and cause you to lose water in your body (get dehydrated). Losing water in your body can cause you to:  Feel tired and thirsty.  Have a dry mouth.  Go pee (urinate) less often. Diarrhea often lasts 2-3 days. However, it can last longer if it is a sign of something more serious. It is important to treat your diarrhea as told by your doctor. Follow these instructions at home: Eating and drinking     Follow these instructions as told by your doctor:  Take an ORS (oral rehydration solution). This is a drink that helps you replace fluids and minerals your body lost. It is sold at pharmacies and stores.  Drink plenty of fluids, such as: ? Water. ? Ice chips. ? Diluted fruit juice. ? Low-calorie sports drinks. ? Milk, if you want.  Avoid drinking fluids that have a lot of sugar or caffeine in them.  Eat bland, easy-to-digest foods in small amounts as you are able. These foods include: ? Bananas. ? Applesauce. ? Rice. ? Low-fat (lean) meats. ? Toast. ? Crackers.  Avoid alcohol.  Avoid spicy or fatty foods.  Medicines  Take over-the-counter and prescription medicines only as told by your doctor.  If you were prescribed an antibiotic medicine, take it as told by your doctor. Do not stop using the antibiotic even if you start to feel better. General instructions   Wash your hands often using soap and water. If soap and water are not available, use a hand sanitizer. Others in your home should wash their hands as well. Hands should be washed: ? After using the toilet or changing a diaper. ? Before preparing, cooking, or serving food. ? While caring for a sick person. ? While visiting someone in a hospital.  Drink enough fluid to keep your pee (urine) pale yellow.  Rest at home while you get better.  Watch your  condition for any changes.  Take a warm bath to help with any burning or pain from having diarrhea.  Keep all follow-up visits as told by your doctor. This is important. Contact a doctor if:  You have a fever.  Your diarrhea gets worse.  You have new symptoms.  You cannot keep fluids down.  You feel light-headed or dizzy.  You have a headache.  You have muscle cramps. Get help right away if:  You have chest pain.  You feel very weak or you pass out (faint).  You have bloody or black poop or poop that looks like tar.  You have very bad pain, cramping, or bloating in your belly (abdomen).  You have trouble breathing or you are breathing very quickly.  Your heart is beating very quickly.  Your skin feels cold and clammy.  You feel confused.  You have signs of losing too much water in your body, such as: ? Dark pee, very little pee, or no pee. ? Cracked lips. ? Dry mouth. ? Sunken eyes. ? Sleepiness. ? Weakness. Summary  Diarrhea is when you pass loose and watery poop (stool) often.  Diarrhea can make you feel weak and cause you to lose water in your body (get dehydrated).  Take an ORS (oral rehydration solution). This is a drink that is sold at pharmacies and stores.  Eat bland, easy-to-digest foods in small amounts as you are able.  Contact a  doctor if your condition gets worse. Get help right away if you have signs that you have lost too much water in your body. This information is not intended to replace advice given to you by your health care provider. Make sure you discuss any questions you have with your health care provider. Document Revised: 09/20/2017 Document Reviewed: 09/20/2017 Elsevier Patient Education  [place].

## 2019-10-29 NOTE — Assessment & Plan Note (Signed)
Better  Cont w/good diet

## 2019-10-29 NOTE — Telephone Encounter (Signed)
Per [facility], Caller states she is sick with diarrhea and has an appointment scheduled today. Caller states she is also experiencing hot flashes and nausea. Caller states that she has had diarrhea 5 times today. Has nausea. No vomiting or blood in stool  Advised to see PCP within 4 hours.  Patient seen in office today.

## 2019-10-29 NOTE — Telephone Encounter (Signed)
See 10/29/2019 office visit

## 2019-10-29 NOTE — Addendum Note (Signed)
Addended by: Lauralee Evener C on: 10/29/2019 11:41 AM   Modules accepted: Orders

## 2019-10-29 NOTE — Progress Notes (Signed)
Subjective:  Patient ID: LADENA JACQUEZ, female    DOB: 1935/07/26  Age: 84 y.o. MRN: [id number]  CC: No chief complaint on file.   HPI JONNIE KUBLY presents for RUQ abd pain yesterday; diarrhea x6 today; nausea, felt warm Pt had tuna on a grill  w/husband  Outpatient Medications Prior to Visit  Medication Sig Dispense Refill  . acetaminophen (TYLENOL) 500 MG tablet Take 500 mg by mouth every 6 (six) hours as needed for mild pain.     . Ascorbic Acid (VITAMIN C PO) Take by mouth.    . Cholecalciferol (VITAMIN D3) 1000 UNITS CAPS Take 1 capsule by mouth daily. Take total of 2 daily    . aspirin (ASPIRIN 81) 81 MG EC tablet Take 81 mg by mouth daily. Swallow whole. (Patient not taking: Reported on 10/29/2019)    . meclizine (ANTIVERT) 12.5 MG tablet Take 1 tablet (12.5 mg total) by mouth 3 (three) times daily as needed for dizziness or nausea. (Patient not taking: Reported on 10/29/2019) 60 tablet 1   No facility-administered medications prior to visit.    ROS: Review of Systems  Constitutional: Positive for chills and fatigue. Negative for activity change, appetite change and unexpected weight change.  HENT: Negative for congestion, mouth sores and sinus pressure.   Eyes: Negative for visual disturbance.  Respiratory: Negative for cough and chest tightness.   Gastrointestinal: Positive for diarrhea and nausea. Negative for abdominal pain and vomiting.  Genitourinary: Negative for difficulty urinating, frequency and vaginal pain.  Musculoskeletal: Negative for back pain and gait problem.  Skin: Negative for pallor and rash.  Neurological: Positive for weakness. Negative for dizziness, tremors, numbness and headaches.  Psychiatric/Behavioral: Negative for confusion and sleep disturbance.    Objective:  There were no vitals taken for this visit.  BP Readings from Last 3 Encounters:  09/14/19 130/60  07/23/19 (!) 144/72  07/02/19 122/65    Wt Readings from Last 3 Encounters:   09/14/19 89 lb 12.8 oz (40.7 kg)  07/23/19 87 lb 9.6 oz (39.7 kg)  03/23/19 91 lb (41.3 kg)    Physical Exam Constitutional:      General: She is not in acute distress.    Appearance: She is well-developed.  HENT:     Head: Normocephalic.     Right Ear: External ear normal.     Left Ear: External ear normal.     Nose: Nose normal.  Eyes:     General:        Right eye: No discharge.        Left eye: No discharge.     Conjunctiva/sclera: Conjunctivae normal.     Pupils: Pupils are equal, round, and reactive to light.  Neck:     Thyroid: No thyromegaly.     Vascular: No JVD.     Trachea: No tracheal deviation.  Cardiovascular:     Rate and Rhythm: Normal rate and regular rhythm.     Heart sounds: Normal heart sounds.  Pulmonary:     Effort: No respiratory distress.     Breath sounds: No stridor. No wheezing.  Abdominal:     General: Bowel sounds are normal. There is no distension.     Palpations: Abdomen is soft. There is no mass.     Tenderness: There is no abdominal tenderness. There is no guarding or rebound.  Musculoskeletal:        General: No tenderness.     Cervical back: Normal range of motion and neck  supple.  Lymphadenopathy:     Cervical: No cervical adenopathy.  Skin:    Findings: No erythema or rash.  Neurological:     Cranial Nerves: No cranial nerve deficit.     Motor: No abnormal muscle tone.     Coordination: Coordination normal.     Deep Tendon Reflexes: Reflexes normal.  Psychiatric:        Behavior: Behavior normal.        Thought Content: Thought content normal.        Judgment: Judgment normal.   abd is sensitve HEENT - dry  Lab Results  Component Value Date   WBC 7.8 07/23/2019   HGB 12.6 07/23/2019   HCT 36.5 07/23/2019   PLT 272.0 07/23/2019   GLUCOSE 87 07/23/2019   CHOL 205 (H) 10/13/2018   TRIG 71.0 10/13/2018   HDL 65.40 10/13/2018   LDLDIRECT 145.7 05/12/2013   LDLCALC 125 (H) 10/13/2018   ALT 18 07/23/2019   AST 19  07/23/2019   NA 135 07/23/2019   K 4.5 07/23/2019   CL 103 07/23/2019   CREATININE 0.62 07/23/2019   BUN 20 07/23/2019   CO2 29 07/23/2019   TSH 4.07 07/23/2019    CT Head Wo Contrast  Result Date: 07/02/2019 CLINICAL DATA:  Dizziness and headache. EXAM: CT HEAD WITHOUT CONTRAST TECHNIQUE: Contiguous axial images were obtained from the base of the skull through the vertex without intravenous contrast. COMPARISON:  Brain MRI 04/25/2014 FINDINGS: Brain: No intracranial hemorrhage, mass effect, or midline shift. Brain volume is normal for age. No hydrocephalus. The basilar cisterns are patent. Mild to moderate chronic small vessel ischemia again seen. No evidence of territorial infarct or acute ischemia. No extra-axial or intracranial fluid collection. Vascular: Atherosclerosis of skullbase vasculature without hyperdense vessel or abnormal calcification. Skull: No fracture or focal lesion. Sinuses/Orbits: Minimal mucosal thickening in left side of sphenoid sinus. Paranasal sinuses are otherwise clear. No mastoid effusion. Bilateral lens extraction Other: None. IMPRESSION: No acute intracranial abnormality. Age related atrophy with stable chronic small vessel ischemia. Electronically Signed   By: Keith Rake M.D.   On: 07/02/2019 15:25    Assessment & Plan:   There are no diagnoses linked to this encounter.   No orders of the defined types were placed in this encounter.    Follow-up: No follow-ups on file.  Walker Kehr, MD

## 2019-11-03 ENCOUNTER — Telehealth: Payer: Self-pay | Admitting: Internal Medicine

## 2019-11-03 DIAGNOSIS — R42 Dizziness and giddiness: Secondary | ICD-10-CM

## 2019-11-03 DIAGNOSIS — E785 Hyperlipidemia, unspecified: Secondary | ICD-10-CM

## 2019-11-03 DIAGNOSIS — F411 Generalized anxiety disorder: Secondary | ICD-10-CM

## 2019-11-03 DIAGNOSIS — R634 Abnormal weight loss: Secondary | ICD-10-CM

## 2019-11-03 NOTE — Telephone Encounter (Signed)
New message:    Pt is calling and states he would like to have lab orders put into the system due to wanting to discuss at the time of the appt. Please advise.

## 2019-11-04 NOTE — Telephone Encounter (Signed)
Called pt there was no answer LMOM MD has placed lab orders in sxs prior to appt.Marland KitchenJohny Montgomery

## 2019-11-18 ENCOUNTER — Ambulatory Visit: Payer: Medicare Other | Admitting: Internal Medicine

## 2019-12-14 DIAGNOSIS — M79672 Pain in left foot: Secondary | ICD-10-CM | POA: Diagnosis not present

## 2019-12-14 DIAGNOSIS — R2242 Localized swelling, mass and lump, left lower limb: Secondary | ICD-10-CM | POA: Diagnosis not present

## 2019-12-14 DIAGNOSIS — R2240 Localized swelling, mass and lump, unspecified lower limb: Secondary | ICD-10-CM | POA: Insufficient documentation

## 2019-12-15 ENCOUNTER — Other Ambulatory Visit: Payer: Self-pay

## 2019-12-15 ENCOUNTER — Ambulatory Visit (INDEPENDENT_AMBULATORY_CARE_PROVIDER_SITE_OTHER): Payer: Medicare Other

## 2019-12-15 VITALS — BP 110/70 | HR 77 | Temp 98.4°F | Resp 16 | Ht 63.0 in | Wt 88.2 lb

## 2019-12-15 DIAGNOSIS — Z0000 Encounter for general adult medical examination without abnormal findings: Secondary | ICD-10-CM

## 2019-12-15 DIAGNOSIS — R6884 Jaw pain: Secondary | ICD-10-CM | POA: Diagnosis not present

## 2019-12-15 NOTE — Patient Instructions (Addendum)
Julie Montgomery , Thank you for taking time to come for your Medicare Wellness Visit. I appreciate your ongoing commitment to your health goals. Please review the following plan we discussed and let me know if I can assist you in the future.   Screening recommendations/referrals: Colonoscopy: 03/05/2019 Mammogram: 11/12/2016 Bone Density: 04/08/2006 Recommended yearly ophthalmology/optometry visit for glaucoma screening and checkup Recommended yearly dental visit for hygiene and checkup  Vaccinations: Influenza vaccine: 01/19/2019 Pneumococcal vaccine: completed Tdap vaccine: 10/18/2015 Shingles vaccine: never done Covid-19: completed  Advanced directives: Please bring a copy of your health care power of attorney and living will to the office at your convenience.  Conditions/risks identified: Yes; Please continue to do your personal lifestyle choices by: daily care of teeth and gums, regular physical activity (goal should be 5 days a week for 30 minutes), eat a healthy diet, avoid tobacco and drug use, limiting any alcohol intake, taking a low-dose aspirin (if not allergic or have been advised by your provider otherwise) and taking vitamins and minerals as recommended by your provider. Continue doing brain stimulating activities (puzzles, reading, adult coloring books, staying active) to keep memory sharp. Continue to eat heart healthy diet (full of fruits, vegetables, whole grains, lean protein, water--limit salt, fat, and sugar intake) and increase physical activity as tolerated.  Next appointment: Please schedule your next Medicare Wellness Visit with your Nurse Health Advisor in 1 year.   Preventive Care 14 Years and Older, Female Preventive care refers to lifestyle choices and visits with your health care provider that can promote health and wellness. What does preventive care include?  A yearly physical exam. This is also called an annual well check.  Dental exams once or twice a  year.  Routine eye exams. Ask your health care provider how often you should have your eyes checked.  Personal lifestyle choices, including:  Daily care of your teeth and gums.  Regular physical activity.  Eating a healthy diet.  Avoiding tobacco and drug use.  Limiting alcohol use.  Practicing safe sex.  Taking low-dose aspirin every day.  Taking vitamin and mineral supplements as recommended by your health care provider. What happens during an annual well check? The services and screenings done by your health care provider during your annual well check will depend on your age, overall health, lifestyle risk factors, and family history of disease. Counseling  Your health care provider may ask you questions about your:  Alcohol use.  Tobacco use.  Drug use.  Emotional well-being.  Home and relationship well-being.  Sexual activity.  Eating habits.  History of falls.  Memory and ability to understand (cognition).  Work and work Statistician.  Reproductive health. Screening  You may have the following tests or measurements:  Height, weight, and BMI.  Blood pressure.  Lipid and cholesterol levels. These may be checked every 5 years, or more frequently if you are over 32 years old.  Skin check.  Lung cancer screening. You may have this screening every year starting at age 11 if you have a 30-pack-year history of smoking and currently smoke or have quit within the past 15 years.  Fecal occult blood test (FOBT) of the stool. You may have this test every year starting at age 33.  Flexible sigmoidoscopy or colonoscopy. You may have a sigmoidoscopy every 5 years or a colonoscopy every 10 years starting at age 40.  Hepatitis C blood test.  Hepatitis B blood test.  Sexually transmitted disease (STD) testing.  Diabetes screening. This is  done by checking your blood sugar (glucose) after you have not eaten for a while (fasting). You may have this done every 1-3  years.  Bone density scan. This is done to screen for osteoporosis. You may have this done starting at age 44.  Mammogram. This may be done every 1-2 years. Talk to your health care provider about how often you should have regular mammograms. Talk with your health care provider about your test results, treatment options, and if necessary, the need for more tests. Vaccines  Your health care provider may recommend certain vaccines, such as:  Influenza vaccine. This is recommended every year.  Tetanus, diphtheria, and acellular pertussis (Tdap, Td) vaccine. You may need a Td booster every 10 years.  Zoster vaccine. You may need this after age 54.  Pneumococcal 13-valent conjugate (PCV13) vaccine. One dose is recommended after age 45.  Pneumococcal polysaccharide (PPSV23) vaccine. One dose is recommended after age 4. Talk to your health care provider about which screenings and vaccines you need and how often you need them. This information is not intended to replace advice given to you by your health care provider. Make sure you discuss any questions you have with your health care provider. Document Released: 05/13/2015 Document Revised: 01/04/2016 Document Reviewed: 02/15/2015 Elsevier Interactive Patient Education  2017 [employer] Prevention in the Home Falls can cause injuries. They can happen to people of all ages. There are many things you can do to make your home safe and to help prevent falls. What can I do on the outside of my home?  Regularly fix the edges of walkways and driveways and fix any cracks.  Remove anything that might make you trip as you walk through a door, such as a raised step or threshold.  Trim any bushes or trees on the path to your home.  Use bright outdoor lighting.  Clear any walking paths of anything that might make someone trip, such as rocks or tools.  Regularly check to see if handrails are loose or broken. Make sure that both sides of any  steps have handrails.  Any raised decks and porches should have guardrails on the edges.  Have any leaves, snow, or ice cleared regularly.  Use sand or salt on walking paths during winter.  Clean up any spills in your garage right away. This includes oil or grease spills. What can I do in the bathroom?  Use night lights.  Install grab bars by the toilet and in the tub and shower. Do not use towel bars as grab bars.  Use non-skid mats or decals in the tub or shower.  If you need to sit down in the shower, use a plastic, non-slip stool.  Keep the floor dry. Clean up any water that spills on the floor as soon as it happens.  Remove soap buildup in the tub or shower regularly.  Attach bath mats securely with double-sided non-slip rug tape.  Do not have throw rugs and other things on the floor that can make you trip. What can I do in the bedroom?  Use night lights.  Make sure that you have a light by your bed that is easy to reach.  Do not use any sheets or blankets that are too big for your bed. They should not hang down onto the floor.  Have a firm chair that has side arms. You can use this for support while you get dressed.  Do not have throw rugs and other things  on the floor that can make you trip. What can I do in the kitchen?  Clean up any spills right away.  Avoid walking on wet floors.  Keep items that you use a lot in easy-to-reach places.  If you need to reach something above you, use a strong step stool that has a grab bar.  Keep electrical cords out of the way.  Do not use floor polish or wax that makes floors slippery. If you must use wax, use non-skid floor wax.  Do not have throw rugs and other things on the floor that can make you trip. What can I do with my stairs?  Do not leave any items on the stairs.  Make sure that there are handrails on both sides of the stairs and use them. Fix handrails that are broken or loose. Make sure that handrails are  as long as the stairways.  Check any carpeting to make sure that it is firmly attached to the stairs. Fix any carpet that is loose or worn.  Avoid having throw rugs at the top or bottom of the stairs. If you do have throw rugs, attach them to the floor with carpet tape.  Make sure that you have a light switch at the top of the stairs and the bottom of the stairs. If you do not have them, ask someone to add them for you. What else can I do to help prevent falls?  Wear shoes that:  Do not have high heels.  Have rubber bottoms.  Are comfortable and fit you well.  Are closed at the toe. Do not wear sandals.  If you use a stepladder:  Make sure that it is fully opened. Do not climb a closed stepladder.  Make sure that both sides of the stepladder are locked into place.  Ask someone to hold it for you, if possible.  Clearly mark and make sure that you can see:  Any grab bars or handrails.  First and last steps.  Where the edge of each step is.  Use tools that help you move around (mobility aids) if they are needed. These include:  Canes.  Walkers.  Scooters.  Crutches.  Turn on the lights when you go into a dark area. Replace any light bulbs as soon as they burn out.  Set up your furniture so you have a clear path. Avoid moving your furniture around.  If any of your floors are uneven, fix them.  If there are any pets around you, be aware of where they are.  Review your medicines with your doctor. Some medicines can make you feel dizzy. This can increase your chance of falling. Ask your doctor what other things that you can do to help prevent falls. This information is not intended to replace advice given to you by your health care provider. Make sure you discuss any questions you have with your health care provider. Document Released: 02/10/2009 Document Revised: 09/22/2015 Document Reviewed: 05/21/2014 Elsevier Interactive Patient Education  2017 [employer].

## 2019-12-15 NOTE — Progress Notes (Addendum)
Subjective:   Julie Montgomery is a 84 y.o. female who presents for Medicare Annual (Subsequent) preventive examination.  Review of Systems    No ROS.  Medicare Wellness Visit Cardiac Risk Factors include: advanced age (>4men, >50 women)     Objective:    Today's Vitals   12/15/19 0949  BP: 110/70  Pulse: 77  Resp: 16  Temp: 98.4 F (36.9 C)  SpO2: 98%  Weight: 88 lb 3.2 oz (40 kg)  Height: 5\' 3"  (1.6 m)  PainSc: 0-No pain   Body mass index is 15.62 kg/m.  Advanced Directives 12/15/2019 07/02/2019 03/05/2019 06/25/2018 01/24/2017 08/15/2016 06/14/2015  Does Patient Have a Medical Advance Directive? Yes No Yes No No No No  Type of Advance Directive Living will - [place]  Does patient want to make changes to medical advance directive? No - Patient declined - No - Patient declined - - - -  Copy of [place] in Chart? - - No - copy requested - - - -  Would patient like information on creating a medical advance directive? - No - Patient declined - Yes (ED - Information included in AVS) No - Patient declined - Yes - Educational materials given    Current Medications (verified) Outpatient Encounter Medications as of 12/15/2019  Medication Sig   acetaminophen (TYLENOL) 500 MG tablet Take 500 mg by mouth every 6 (six) hours as needed for mild pain.    Ascorbic Acid (VITAMIN C PO) Take by mouth.   aspirin (ASPIRIN 81) 81 MG EC tablet Take 81 mg by mouth daily. Swallow whole. (Patient not taking: Reported on 10/29/2019)   Cholecalciferol (VITAMIN D3) 1000 UNITS CAPS Take 1 capsule by mouth daily. Take total of 2 daily   [DISCONTINUED] meclizine (ANTIVERT) 12.5 MG tablet Take 1 tablet (12.5 mg total) by mouth 3 (three) times daily as needed for dizziness or nausea. (Patient not taking: Reported on 10/29/2019)   [DISCONTINUED] ondansetron (ZOFRAN) 4 MG tablet Take 1 tablet (4 mg total) by mouth every 8 (eight) hours as needed for nausea or  vomiting.   No facility-administered encounter medications on file as of 12/15/2019.    Allergies (verified) Fluoxetine hcl, Lovastatin, Mometasone furo-formoterol fum, and Sulfa antibiotics   History: Past Medical History:  Diagnosis Date   Adenomatous polyp of colon 2020   Allergy    Anxiety    Basal cell carcinoma (BCC)    Cataract    Depression    Helicobacter pylori gastritis    Osteopenia    Pneumonia    hx of 03/2008   Stroke [facility])    TIA long time ago   Past Surgical History:  Procedure Laterality Date   APPENDECTOMY     BREAST LUMPECTOMY     right   CATARACT EXTRACTION W/ INTRAOCULAR LENS  IMPLANT, BILATERAL Bilateral    CESAREAN SECTION     CHOLECYSTECTOMY     MOHS SURGERY     PARTIAL HYSTERECTOMY     ROTATOR CUFF REPAIR  2012   SKIN GRAFT     teeth remmoved     Family History  Problem Relation Age of Onset   Diabetes Sister    Cancer Sister        lung ca   Coronary artery disease Brother    Dementia Brother    Cancer Brother        lung ca   Stomach cancer Paternal Grandmother  Colon cancer Son    Breast cancer Neg Hx    Esophageal cancer Neg Hx    Rectal cancer Neg Hx    Social History   Socioeconomic History   Marital status: Married    Spouse name: Not on file   Number of children: 4   Years of education: Not on file   Highest education level: Not on file  Occupational History   Occupation: Oceanographer  Tobacco Use   Smoking status: Never Smoker   Smokeless tobacco: Never Used  Scientific laboratory technician Use: Never used  Substance and Sexual Activity   Alcohol use: No   Drug use: No   Sexual activity: Not Currently  Other Topics Concern   Not on file  Social History Narrative   Married, Retired, she was looking after her GGD, now in court battle   Social Determinants of Radio broadcast assistant Strain: Low Risk    Difficulty of Paying Living Expenses: Not hard at all  Food Insecurity: No Food Insecurity   Worried  About Charity fundraiser in the Last Year: Never true   Arboriculturist in the Last Year: Never true  Transportation Needs: No Transportation Needs   Lack of Transportation (Medical): No   Lack of Transportation (Non-Medical): No  Physical Activity: Sufficiently Active   Days of Exercise per Week: 5 days   Minutes of Exercise per Session: 30 min  Stress: No Stress Concern Present   Feeling of Stress : Not at all  Social Connections: Socially Integrated   Frequency of Communication with Friends and Family: More than three times a week   Frequency of Social Gatherings with Friends and Family: Once a week   Attends Religious Services: More than 4 times per year   Active Member of [employer] or Organizations: Yes   Attends Music therapist: More than 4 times per year   Marital Status: Married    Tobacco Counseling Counseling given: No   Clinical Intake:  Pre-visit preparation completed: Yes  Pain : No/denies pain Pain Score: 0-No pain     BMI - recorded: 15.62 Nutritional Status: BMI <19  Underweight Nutritional Risks: Unintentional weight loss Diabetes: No  How often do you need to have someone help you when you read instructions, pamphlets, or other written materials from your doctor or pharmacy?: 1 - Never What is the last grade level you completed in school?: Master's Degree in English  Diabetic? no     Information entered by :: Rami Budhu N. Murrell Dome, LPN   Activities of Daily Living In your present state of health, do you have any difficulty performing the following activities: 12/15/2019  Hearing? N  Vision? N  Difficulty concentrating or making decisions? N  Walking or climbing stairs? N  Dressing or bathing? N  Doing errands, shopping? N  Preparing Food and eating ? N  Using the Toilet? N  In the past six months, have you accidently leaked urine? N  Do you have problems with loss of bowel control? N  Managing your Medications? N  Managing your  Finances? N  Housekeeping or managing your Housekeeping? N  Some recent data might be hidden    Patient Care Team: Plotnikov, Evie Lacks, MD as PCP - General Ladene Artist, MD as Consulting Physician (Gastroenterology)  Indicate any recent Medical Services you may have received from other than [facility] providers in the past year (date may be approximate).     Assessment:  This is a routine wellness examination for Julie Montgomery.  Hearing/Vision screen No exam data present  Dietary issues and exercise activities discussed: Current Exercise Habits: Home exercise routine, Type of exercise: walking;Other - see comments (yard work, gardening, daily housework), Time (Minutes): 30, Frequency (Times/Week): 7, Weekly Exercise (Minutes/Week): 210, Intensity: Mild, Exercise limited by: None identified  Goals       <enter goal here>      Increase my physical and social activity      I will go to the gym 2-3 times per week and inquire about social activities I can join at church.      patient (pt-stated)      Will see Dr. Rexene Edison to stabilize grief and constant demands of family and self care       Patient Stated (pt-stated)      My goal is to see why I'm losing so much weight.  My ideal weight goal is to be 115 pounds.       Depression Screen PHQ 2/9 Scores 12/15/2019 06/25/2018 03/20/2018 01/24/2017 10/30/2016 06/14/2015 11/05/2014  PHQ - 2 Score 0 2 0 3 1 2 1   PHQ- 9 Score - 4 0 4 - 7 -    Fall Risk Fall Risk  12/15/2019 06/25/2018 03/20/2018 03/17/2018 01/24/2017  Falls in the past year? 0 0 0 0 Yes  Comment - - - Emmi Telephone Survey: data to providers prior to load -  Number falls in past yr: 0 0 - - -  Injury with Fall? 0 - - - -  Risk for fall due to : No Fall Risks History of fall(s) - - -  Follow up Falls evaluation completed Falls prevention discussed Falls evaluation completed - -    Any stairs in or around the home? No  If so, are there any without handrails? No  Home free of loose  throw rugs in walkways, pet beds, electrical cords, etc? Yes  Adequate lighting in your home to reduce risk of falls? Yes   ASSISTIVE DEVICES UTILIZED TO PREVENT FALLS:  Life alert? No  Use of a cane, walker or w/c? No  Grab bars in the bathroom? No  Shower chair or bench in shower? No  Elevated toilet seat or a handicapped toilet? No   TIMED UP AND GO:  Was the test performed? No .  Length of time to ambulate 10 feet: 0 sec.   Gait steady and fast without use of assistive device  Cognitive Function: MMSE - Mini Mental State Exam 01/24/2017 06/14/2015  Not completed: - (No Data)  Orientation to time 5 -  Orientation to Place 5 -  Registration 3 -  Attention/ Calculation 5 -  Recall 3 -  Language- name 2 objects 2 -  Language- repeat 1 -  Language- follow 3 step command 3 -  Language- read & follow direction 1 -  Write a sentence 1 -  Copy design 1 -  Total score 30 -     6CIT Screen 12/15/2019  What Year? 0 points  What month? 0 points  What time? 0 points  Count back from 20 0 points  Months in reverse 0 points  Repeat phrase 0 points  Total Score 0    Immunizations Immunization History  Administered Date(s) Administered   Fluad Quad(high Dose 65+) 01/19/2019   Hepatitis A 06/14/2005   Hepatitis B 06/14/2005   Influenza Split 01/22/2011, 01/23/2012   Influenza Whole 01/29/2008, 02/17/2008, 02/08/2009   Influenza, High Dose Seasonal PF  02/17/2013, 02/17/2016, 01/24/2017, 01/29/2018   Influenza,inj,Quad PF,6+ Mos 01/20/2014, 12/30/2014   PFIZER SARS-COV-2 Vaccination 06/06/2019, 07/01/2019   PPD Test 05/25/2011   Pneumococcal Conjugate-13 05/12/2013   Pneumococcal Polysaccharide-23 02/02/2005, 05/25/2011   Td 06/14/2005   Tdap 10/18/2015   Zoster 08/18/2007    TDAP status: Up to date Flu Vaccine status: Up to date Pneumococcal vaccine status: Up to date Covid-19 vaccine status: Completed vaccines  Qualifies for Shingles Vaccine? Yes   Zostavax  completed Yes   Shingrix Completed?: No.    Education has been provided regarding the importance of this vaccine. Patient has been advised to call insurance company to determine out of pocket expense if they have not yet received this vaccine. Advised may also receive vaccine at local pharmacy or Health Dept. Verbalized acceptance and understanding.  Screening Tests Health Maintenance  Topic Date Due   INFLUENZA VACCINE  11/29/2019   TETANUS/TDAP  10/17/2025   DEXA SCAN  Completed   COVID-19 Vaccine  Completed   PNA vac Low Risk Adult  Completed    Health Maintenance  Health Maintenance Due  Topic Date Due   INFLUENZA VACCINE  11/29/2019    Colorectal cancer screening: No longer required.  Mammogram status: Completed 11/12/2016; has been scheduled for 2021. Repeat every year Bone density status: has been scheduled for Fall 2021 with OB/GYN  Lung Cancer Screening: (Low Dose CT Chest recommended if Age 69-80 years, 30 pack-year currently smoking OR have quit w/in 15years.) does not qualify.   Lung Cancer Screening Referral: no  Additional Screening:  Hepatitis C Screening: does not qualify; Completed no  Vision Screening: Recommended annual ophthalmology exams for early detection of glaucoma and other disorders of the eye. Is the patient up to date with their annual eye exam?  Yes  Who is the provider or what is the name of the office in which the patient attends annual eye exams? Shon Hough, MD If pt is not established with a provider, would they like to be referred to a provider to establish care? No .   Dental Screening: Recommended annual dental exams for proper oral hygiene  Community Resource Referral / Chronic Care Management: CRR required this visit?  No   CCM required this visit?  No      Plan:     I have personally reviewed and noted the following in the patient's chart:   Medical and social history Use of alcohol, tobacco or illicit drugs  Current  medications and supplements Functional ability and status Nutritional status Physical activity Advanced directives List of other physicians Hospitalizations, surgeries, and ER visits in previous 12 months Vitals Screenings to include cognitive, depression, and falls Referrals and appointments  In addition, I have reviewed and discussed with patient certain preventive protocols, quality metrics, and best practice recommendations. A written personalized care plan for preventive services as well as general preventive health recommendations were provided to patient.     Sheral Flow, LPN   [id number]   Nurse Notes: n/a   Medical screening examination/treatment/procedure(s) were performed by non-physician practitioner and as supervising physician I was immediately available for consultation/collaboration.  I agree with above. Lew Dawes, MD

## 2019-12-29 DIAGNOSIS — M79672 Pain in left foot: Secondary | ICD-10-CM | POA: Diagnosis not present

## 2019-12-29 DIAGNOSIS — M67472 Ganglion, left ankle and foot: Secondary | ICD-10-CM | POA: Diagnosis not present

## 2020-01-11 DIAGNOSIS — M67472 Ganglion, left ankle and foot: Secondary | ICD-10-CM | POA: Insufficient documentation

## 2020-01-14 DIAGNOSIS — E559 Vitamin D deficiency, unspecified: Secondary | ICD-10-CM | POA: Diagnosis not present

## 2020-01-14 DIAGNOSIS — Z1231 Encounter for screening mammogram for malignant neoplasm of breast: Secondary | ICD-10-CM | POA: Diagnosis not present

## 2020-01-14 DIAGNOSIS — Z681 Body mass index (BMI) 19 or less, adult: Secondary | ICD-10-CM | POA: Diagnosis not present

## 2020-01-14 DIAGNOSIS — M810 Age-related osteoporosis without current pathological fracture: Secondary | ICD-10-CM | POA: Diagnosis not present

## 2020-01-14 DIAGNOSIS — Z1211 Encounter for screening for malignant neoplasm of colon: Secondary | ICD-10-CM | POA: Diagnosis not present

## 2020-01-14 DIAGNOSIS — Z01419 Encounter for gynecological examination (general) (routine) without abnormal findings: Secondary | ICD-10-CM | POA: Diagnosis not present

## 2020-01-18 ENCOUNTER — Other Ambulatory Visit (INDEPENDENT_AMBULATORY_CARE_PROVIDER_SITE_OTHER): Payer: Medicare Other

## 2020-01-18 DIAGNOSIS — E785 Hyperlipidemia, unspecified: Secondary | ICD-10-CM | POA: Diagnosis not present

## 2020-01-18 DIAGNOSIS — F411 Generalized anxiety disorder: Secondary | ICD-10-CM

## 2020-01-18 DIAGNOSIS — R42 Dizziness and giddiness: Secondary | ICD-10-CM | POA: Diagnosis not present

## 2020-01-18 DIAGNOSIS — R634 Abnormal weight loss: Secondary | ICD-10-CM | POA: Diagnosis not present

## 2020-01-18 LAB — CBC WITH DIFFERENTIAL/PLATELET
Basophils Absolute: 0 10*3/uL (ref 0.0–0.1)
Basophils Relative: 0.8 % (ref 0.0–3.0)
Eosinophils Absolute: 0.2 10*3/uL (ref 0.0–0.7)
Eosinophils Relative: 3.1 % (ref 0.0–5.0)
HCT: 37.4 % (ref 36.0–46.0)
Hemoglobin: 12.5 g/dL (ref 12.0–15.0)
Lymphocytes Relative: 25.2 % (ref 12.0–46.0)
Lymphs Abs: 1.5 10*3/uL (ref 0.7–4.0)
MCHC: 33.5 g/dL (ref 30.0–36.0)
MCV: 97 fl (ref 78.0–100.0)
Monocytes Absolute: 0.5 10*3/uL (ref 0.1–1.0)
Monocytes Relative: 7.7 % (ref 3.0–12.0)
Neutro Abs: 3.9 10*3/uL (ref 1.4–7.7)
Neutrophils Relative %: 63.2 % (ref 43.0–77.0)
Platelets: 203 10*3/uL (ref 150.0–400.0)
RBC: 3.85 Mil/uL — ABNORMAL LOW (ref 3.87–5.11)
RDW: 13.2 % (ref 11.5–15.5)
WBC: 6.1 10*3/uL (ref 4.0–10.5)

## 2020-01-18 LAB — LIPID PANEL
Cholesterol: 203 mg/dL — ABNORMAL HIGH (ref 0–200)
HDL: 69.7 mg/dL (ref >39.00)
LDL Cholesterol: 113 mg/dL — ABNORMAL HIGH (ref 0–99)
NonHDL: 133.6
Total CHOL/HDL Ratio: 3
Triglycerides: 104 mg/dL (ref 0.0–149.0)
VLDL: 20.8 mg/dL (ref 0.0–40.0)

## 2020-01-18 LAB — T4, FREE: Free T4: 0.74 ng/dL (ref 0.60–1.60)

## 2020-01-18 LAB — BASIC METABOLIC PANEL
BUN: 19 mg/dL (ref 6–23)
CO2: 29 mEq/L (ref 19–32)
Calcium: 9.3 mg/dL (ref 8.4–10.5)
Chloride: 103 mEq/L (ref 96–112)
Creatinine, Ser: 0.79 mg/dL (ref 0.40–1.20)
GFR: 69.7 mL/min (ref >60.00)
Glucose, Bld: 95 mg/dL (ref 70–99)
Potassium: 4.2 mEq/L (ref 3.5–5.1)
Sodium: 138 mEq/L (ref 135–145)

## 2020-01-18 LAB — HEPATIC FUNCTION PANEL
ALT: 18 U/L (ref 0–35)
AST: 19 U/L (ref 0–37)
Albumin: 4.2 g/dL (ref 3.5–5.2)
Alkaline Phosphatase: 87 U/L (ref 39–117)
Bilirubin, Direct: 0 mg/dL (ref 0.0–0.3)
Total Bilirubin: 0.4 mg/dL (ref 0.2–1.2)
Total Protein: 6.4 g/dL (ref 6.0–8.3)

## 2020-01-18 LAB — URINALYSIS, ROUTINE W REFLEX MICROSCOPIC
Bilirubin Urine: NEGATIVE
Hgb urine dipstick: NEGATIVE
Ketones, ur: NEGATIVE
Nitrite: NEGATIVE
RBC / HPF: NONE SEEN (ref 0–?)
Specific Gravity, Urine: 1.015 (ref 1.000–1.030)
Total Protein, Urine: NEGATIVE
Urine Glucose: NEGATIVE
Urobilinogen, UA: 0.2 (ref 0.0–1.0)
pH: 7 (ref 5.0–8.0)

## 2020-01-18 LAB — TSH: TSH: 3 u[IU]/mL (ref 0.35–4.50)

## 2020-01-19 ENCOUNTER — Ambulatory Visit (INDEPENDENT_AMBULATORY_CARE_PROVIDER_SITE_OTHER): Payer: Medicare Other | Admitting: Internal Medicine

## 2020-01-19 ENCOUNTER — Other Ambulatory Visit: Payer: Self-pay

## 2020-01-19 ENCOUNTER — Encounter: Payer: Self-pay | Admitting: Internal Medicine

## 2020-01-19 VITALS — BP 138/82 | HR 64 | Temp 98.3°F | Ht 63.0 in | Wt 90.0 lb

## 2020-01-19 DIAGNOSIS — F411 Generalized anxiety disorder: Secondary | ICD-10-CM

## 2020-01-19 DIAGNOSIS — R634 Abnormal weight loss: Secondary | ICD-10-CM

## 2020-01-19 DIAGNOSIS — R413 Other amnesia: Secondary | ICD-10-CM

## 2020-01-19 DIAGNOSIS — I2510 Atherosclerotic heart disease of native coronary artery without angina pectoris: Secondary | ICD-10-CM | POA: Diagnosis not present

## 2020-01-19 DIAGNOSIS — Z23 Encounter for immunization: Secondary | ICD-10-CM

## 2020-01-19 DIAGNOSIS — E785 Hyperlipidemia, unspecified: Secondary | ICD-10-CM

## 2020-01-19 MED ORDER — LORAZEPAM 0.5 MG PO TABS
0.5000 mg | ORAL_TABLET | Freq: Two times a day (BID) | ORAL | 1 refills | Status: DC | PRN
Start: 2020-01-19 — End: 2021-02-14

## 2020-01-19 NOTE — Assessment & Plan Note (Signed)
Currently related to her dental issues

## 2020-01-19 NOTE — Assessment & Plan Note (Signed)
try Lion's Mane Mushroom extract or capsules for memory 

## 2020-01-19 NOTE — Assessment & Plan Note (Signed)
Red Rice Yeast extract to try

## 2020-01-19 NOTE — Patient Instructions (Signed)
You can try Lion's Mane Mushroom extract or capsules for memory   

## 2020-01-19 NOTE — Assessment & Plan Note (Signed)
Not on Rx 

## 2020-01-19 NOTE — Assessment & Plan Note (Addendum)
Worse Low dose lorazepam prn

## 2020-01-19 NOTE — Progress Notes (Signed)
Subjective:  Patient ID: Julie Montgomery, female    DOB: 08-21-1935  Age: 84 y.o. MRN: [id number]  CC: No chief complaint on file.   HPI Julie Montgomery presents for wt loss, stress, vertigo f/u She cont to have dental problems C/o panic attacks C/o memory issues   Outpatient Medications Prior to Visit  Medication Sig Dispense Refill  . acetaminophen (TYLENOL) 500 MG tablet Take 500 mg by mouth every 6 (six) hours as needed for mild pain.     . Ascorbic Acid (VITAMIN C PO) Take by mouth.    Marland Kitchen aspirin (ASPIRIN 81) 81 MG EC tablet Take 81 mg by mouth daily. Swallow whole.     . Cholecalciferol (VITAMIN D3) 1000 UNITS CAPS Take 1 capsule by mouth daily. Take total of 2 daily     No facility-administered medications prior to visit.    ROS: Review of Systems  Constitutional: Positive for unexpected weight change. Negative for activity change, appetite change, chills and fatigue.  HENT: Negative for congestion, mouth sores and sinus pressure.   Eyes: Negative for visual disturbance.  Respiratory: Negative for cough and chest tightness.   Gastrointestinal: Negative for abdominal pain and nausea.  Genitourinary: Negative for difficulty urinating, frequency and vaginal pain.  Musculoskeletal: Negative for back pain and gait problem.  Skin: Negative for pallor and rash.  Neurological: Negative for dizziness, tremors, weakness, numbness and headaches.  Psychiatric/Behavioral: Negative for confusion and sleep disturbance. The patient is nervous/anxious.     Objective:  BP 138/82   Pulse 64   Temp 98.3 F (36.8 C) (Oral)   Ht 5\' 3"  (1.6 m)   Wt 90 lb (40.8 kg)   SpO2 95%   BMI 15.94 kg/m   BP Readings from Last 3 Encounters:  01/19/20 138/82  12/15/19 110/70  10/29/19 (!) 142/88    Wt Readings from Last 3 Encounters:  01/19/20 90 lb (40.8 kg)  12/15/19 88 lb 3.2 oz (40 kg)  10/29/19 89 lb (40.4 kg)    Physical Exam Constitutional:      General: She is not in acute  distress.    Appearance: Normal appearance. She is well-developed.  HENT:     Head: Normocephalic.     Right Ear: External ear normal.     Left Ear: External ear normal.     Nose: Nose normal.  Eyes:     General:        Right eye: No discharge.        Left eye: No discharge.     Conjunctiva/sclera: Conjunctivae normal.     Pupils: Pupils are equal, round, and reactive to light.  Neck:     Thyroid: No thyromegaly.     Vascular: No JVD.     Trachea: No tracheal deviation.  Cardiovascular:     Rate and Rhythm: Normal rate and regular rhythm.     Heart sounds: Normal heart sounds.  Pulmonary:     Effort: No respiratory distress.     Breath sounds: No stridor. No wheezing.  Abdominal:     General: Bowel sounds are normal. There is no distension.     Palpations: Abdomen is soft. There is no mass.     Tenderness: There is no abdominal tenderness. There is no guarding or rebound.  Musculoskeletal:        General: No tenderness.     Cervical back: Normal range of motion and neck supple.  Lymphadenopathy:     Cervical: No cervical adenopathy.  Skin:    Findings: No erythema or rash.  Neurological:     Mental Status: She is oriented to person, place, and time.     Cranial Nerves: No cranial nerve deficit.     Motor: No abnormal muscle tone.     Coordination: Coordination normal.     Deep Tendon Reflexes: Reflexes normal.  Psychiatric:        Behavior: Behavior normal.        Thought Content: Thought content normal.        Judgment: Judgment normal.     Lab Results  Component Value Date   WBC 6.1 01/18/2020   HGB 12.5 01/18/2020   HCT 37.4 01/18/2020   PLT 203.0 01/18/2020   GLUCOSE 95 01/18/2020   CHOL 203 (H) 01/18/2020   TRIG 104.0 01/18/2020   HDL 69.70 01/18/2020   LDLDIRECT 145.7 05/12/2013   LDLCALC 113 (H) 01/18/2020   ALT 18 01/18/2020   AST 19 01/18/2020   NA 138 01/18/2020   K 4.2 01/18/2020   CL 103 01/18/2020   CREATININE 0.79 01/18/2020   BUN 19  01/18/2020   CO2 29 01/18/2020   TSH 3.00 01/18/2020    CT Head Wo Contrast  Result Date: 07/02/2019 CLINICAL DATA:  Dizziness and headache. EXAM: CT HEAD WITHOUT CONTRAST TECHNIQUE: Contiguous axial images were obtained from the base of the skull through the vertex without intravenous contrast. COMPARISON:  Brain MRI 04/25/2014 FINDINGS: Brain: No intracranial hemorrhage, mass effect, or midline shift. Brain volume is normal for age. No hydrocephalus. The basilar cisterns are patent. Mild to moderate chronic small vessel ischemia again seen. No evidence of territorial infarct or acute ischemia. No extra-axial or intracranial fluid collection. Vascular: Atherosclerosis of skullbase vasculature without hyperdense vessel or abnormal calcification. Skull: No fracture or focal lesion. Sinuses/Orbits: Minimal mucosal thickening in left side of sphenoid sinus. Paranasal sinuses are otherwise clear. No mastoid effusion. Bilateral lens extraction Other: None. IMPRESSION: No acute intracranial abnormality. Age related atrophy with stable chronic small vessel ischemia. Electronically Signed   By: Keith Rake M.D.   On: 07/02/2019 15:25    Assessment & Plan:    Walker Kehr, MD

## 2020-01-29 ENCOUNTER — Encounter (HOSPITAL_COMMUNITY): Payer: Self-pay

## 2020-01-29 ENCOUNTER — Other Ambulatory Visit: Payer: Self-pay

## 2020-01-29 ENCOUNTER — Ambulatory Visit (HOSPITAL_COMMUNITY)
Admission: EM | Admit: 2020-01-29 | Discharge: 2020-01-29 | Disposition: A | Discharge status: Home / Self Care | Payer: Medicare Other | Attending: Family Medicine | Admitting: Family Medicine

## 2020-01-29 ENCOUNTER — Ambulatory Visit (INDEPENDENT_AMBULATORY_CARE_PROVIDER_SITE_OTHER): Payer: Medicare Other

## 2020-01-29 DIAGNOSIS — J029 Acute pharyngitis, unspecified: Secondary | ICD-10-CM | POA: Diagnosis not present

## 2020-01-29 DIAGNOSIS — Z7982 Long term (current) use of aspirin: Secondary | ICD-10-CM | POA: Diagnosis not present

## 2020-01-29 DIAGNOSIS — R0602 Shortness of breath: Secondary | ICD-10-CM | POA: Diagnosis not present

## 2020-01-29 DIAGNOSIS — R059 Cough, unspecified: Secondary | ICD-10-CM

## 2020-01-29 DIAGNOSIS — Z9049 Acquired absence of other specified parts of digestive tract: Secondary | ICD-10-CM | POA: Insufficient documentation

## 2020-01-29 DIAGNOSIS — I2510 Atherosclerotic heart disease of native coronary artery without angina pectoris: Secondary | ICD-10-CM | POA: Diagnosis not present

## 2020-01-29 DIAGNOSIS — R062 Wheezing: Secondary | ICD-10-CM

## 2020-01-29 DIAGNOSIS — Z20822 Contact with and (suspected) exposure to covid-19: Secondary | ICD-10-CM | POA: Insufficient documentation

## 2020-01-29 DIAGNOSIS — Z882 Allergy status to sulfonamides status: Secondary | ICD-10-CM | POA: Insufficient documentation

## 2020-01-29 DIAGNOSIS — R911 Solitary pulmonary nodule: Secondary | ICD-10-CM | POA: Diagnosis not present

## 2020-01-29 DIAGNOSIS — F419 Anxiety disorder, unspecified: Secondary | ICD-10-CM | POA: Diagnosis not present

## 2020-01-29 DIAGNOSIS — Z8673 Personal history of transient ischemic attack (TIA), and cerebral infarction without residual deficits: Secondary | ICD-10-CM | POA: Diagnosis not present

## 2020-01-29 DIAGNOSIS — E785 Hyperlipidemia, unspecified: Secondary | ICD-10-CM | POA: Insufficient documentation

## 2020-01-29 DIAGNOSIS — J449 Chronic obstructive pulmonary disease, unspecified: Secondary | ICD-10-CM | POA: Diagnosis not present

## 2020-01-29 DIAGNOSIS — Z8709 Personal history of other diseases of the respiratory system: Secondary | ICD-10-CM | POA: Diagnosis not present

## 2020-01-29 LAB — SARS CORONAVIRUS 2 (TAT 6-24 HRS): SARS Coronavirus 2: NEGATIVE

## 2020-01-29 MED ORDER — AZITHROMYCIN 250 MG PO TABS: ORAL_TABLET | ORAL | 0 refills | Status: DC

## 2020-01-29 MED ORDER — ALBUTEROL SULFATE HFA 108 (90 BASE) MCG/ACT IN AERS
INHALATION_SPRAY | RESPIRATORY_TRACT | Status: AC
  Filled 2020-01-29: qty 6.7

## 2020-01-29 MED ORDER — AEROCHAMBER PLUS FLO-VU LARGE MISC
Status: AC
  Filled 2020-01-29: qty 1

## 2020-01-29 MED ORDER — AEROCHAMBER PLUS FLO-VU MEDIUM MISC
1.0000 | Freq: Once | Status: AC
  Administered 2020-01-29: 1

## 2020-01-29 MED ORDER — ALBUTEROL SULFATE HFA 108 (90 BASE) MCG/ACT IN AERS
2.0000 | INHALATION_SPRAY | Freq: Once | RESPIRATORY_TRACT | Status: AC
  Administered 2020-01-29: 2 via RESPIRATORY_TRACT

## 2020-01-29 NOTE — Discharge Instructions (Signed)
Drink more water Use inhaler as needed Check for COVID test tomorrow Fill and take the antibiotic if you feel worse or fail to improve in a week

## 2020-01-29 NOTE — ED Provider Notes (Signed)
[facility]    CSN: [id number] Arrival date & time: 01/29/20  1128      History   Chief Complaint Chief Complaint  Patient presents with  . Cough  . Shortness of Breath    HPI Julie Montgomery is a 84 y.o. female.   HPI  Patient states her husband had an upper respiratory infection with a cough last week.  He is better.  Now for the last 2 days she has had some coughing, shortness of breath, and sore throat.  She states she used to be on an inhaler but she has run out.  She no longer has albuterol.  She does have a history of bronchiectasis.  She has frequent bronchial infections.  She is here today because she feels like she is coming down with pneumonia.  She is also had pneumonia several times.  She states that she has shortness of breath, cough, yellow sputum, and fatigue.  No exposure to Covid.  She has had both Covid vaccinations.  Past Medical History:  Diagnosis Date  . Adenomatous polyp of colon 2020  . Allergy   . Anxiety   . Basal cell carcinoma (BCC)   . Cataract   . Depression   . Helicobacter pylori gastritis   . Osteopenia   . Pneumonia    hx of 03/2008  . Stroke [facility])    TIA long time ago    Patient Active Problem List   Diagnosis Date Noted  . Memory problem 01/19/2020  . Gastroenteritis 03/23/2019  . IBS (irritable bowel syndrome) 01/19/2019  . Coronary atherosclerosis 06/11/2018  . Sternoclavicular joint pain, right 06/12/2017  . Left ulnar fracture 09/06/2016  . Carotid bruit 02/17/2016  . Stress at home 10/18/2015  . Swelling of foot joint 10/14/2014  . Clostridium difficile colitis 10/04/2014  . Diarrhea 10/04/2014  . Numbness of left hand 04/19/2014  . Lipoma of arm 01/23/2012  . Well adult exam 04/03/2011  . Edema 02/13/2011  . Shoulder pain, right 01/30/2011  . Bronchiectasis ([place]) 01/23/2011  . Vertigo 09/13/2010  . Allergic rhinitis 09/13/2010  . CONSTIPATION 03/22/2010  . LUMBAGO 03/22/2010  . URINARY FREQUENCY,  CHRONIC 03/22/2010  . Situational mixed anxiety and depressive disorder 02/25/2008  . Cough 02/17/2008  . Anxiety disorder 02/07/2007  . INSOMNIA-SLEEP DISORDER-UNSPEC 02/07/2007  . Weight loss 02/07/2007  . Dyslipidemia 11/21/2006  . OSTEOPENIA 11/21/2006  . ALLERGY 11/21/2006    Past Surgical History:  Procedure Laterality Date  . APPENDECTOMY    . BREAST LUMPECTOMY     right  . CATARACT EXTRACTION W/ INTRAOCULAR LENS  IMPLANT, BILATERAL Bilateral   . CESAREAN SECTION    . CHOLECYSTECTOMY    . MOHS SURGERY    . PARTIAL HYSTERECTOMY    . ROTATOR CUFF REPAIR  2012  . SKIN GRAFT    . teeth remmoved      OB History   No obstetric history on file.      Home Medications    Prior to Admission medications   Medication Sig Start Date End Date Taking? Authorizing Provider  acetaminophen (TYLENOL) 500 MG tablet Take 500 mg by mouth every 6 (six) hours as needed for mild pain.    Yes [provider]  Ascorbic Acid (VITAMIN C PO) Take by mouth.   Yes [provider]  aspirin (ASPIRIN 81) 81 MG EC tablet Take 81 mg by mouth daily. Swallow whole.    Yes [provider]  Cholecalciferol (VITAMIN D3) 1000 UNITS  CAPS Take 1 capsule by mouth daily. Take total of 2 daily   Yes [provider]  azithromycin (ZITHROMAX Z-PAK) 250 MG tablet Take two pills today followed by one a day until gone 01/29/20   Raylene Everts, MD  LORazepam (ATIVAN) 0.5 MG tablet Take 1 tablet (0.5 mg total) by mouth 2 (two) times daily as needed for anxiety. 01/19/20   Plotnikov, Evie Lacks, MD    Family History Family History  Problem Relation Age of Onset  . Diabetes Sister   . Cancer Sister        lung ca  . Coronary artery disease Brother   . Dementia Brother   . Cancer Brother        lung ca  . Stomach cancer Paternal Grandmother   . Colon cancer Son   . Breast cancer Neg Hx   . Esophageal cancer Neg Hx   . Rectal cancer Neg Hx     Social History Social  History   Tobacco Use  . Smoking status: Never Smoker  . Smokeless tobacco: Never Used  Vaping Use  . Vaping Use: Never used  Substance Use Topics  . Alcohol use: No  . Drug use: No     Allergies   Fluoxetine hcl, Lovastatin, Mometasone furo-formoterol fum, and Sulfa antibiotics   Review of Systems Review of Systems See HPI  Physical Exam Triage Vital Signs ED Triage Vitals  Enc Vitals Group     BP 01/29/20 1248 (!) 147/60     Pulse Rate 01/29/20 1248 62     Resp 01/29/20 1248 17     Temp 01/29/20 1248 (!) 97.4 F (36.3 C)     Temp Source 01/29/20 1248 Oral     SpO2 01/29/20 1248 99 %     Weight --      Height --      Head Circumference --      Peak Flow --      Pain Score 01/29/20 1242 0     Pain Loc --      Pain Edu? --      Excl. in [place]? --    No data found.  Updated Vital Signs BP (!) 147/60 (BP Location: Right Arm)   Pulse 62   Temp (!) 97.4 F (36.3 C) (Oral)   Resp 17   SpO2 99%      Physical Exam Constitutional:      General: She is not in acute distress.    Appearance: She is well-developed.     Comments: Frail elderly woman.  No acute distress.  Mildly anxious.  HENT:     Head: Normocephalic and atraumatic.     Mouth/Throat:     Mouth: Mucous membranes are moist.  Eyes:     Conjunctiva/sclera: Conjunctivae normal.     Pupils: Pupils are equal, round, and reactive to light.  Cardiovascular:     Rate and Rhythm: Normal rate and regular rhythm.     Heart sounds: No murmur heard.   Pulmonary:     Effort: Pulmonary effort is normal. No respiratory distress.     Breath sounds: Wheezing and rhonchi present. No rales.     Comments: Initially patient had poor diaphragmatic excursion with some scattered rhonchi and wheeze.  After albuterol inhaler, reexamination revealed clear lungs Abdominal:     General: Abdomen is flat. There is no distension.     Palpations: Abdomen is soft.  Musculoskeletal:        General: Normal  range of motion.      Cervical back: Normal range of motion.  Lymphadenopathy:     Cervical: No cervical adenopathy.  Skin:    General: Skin is warm and dry.  Neurological:     General: No focal deficit present.     Mental Status: She is alert.  Psychiatric:        Mood and Affect: Mood normal.        Behavior: Behavior normal.      UC Treatments / Results  Labs (all labs ordered are listed, but only abnormal results are displayed) Labs Reviewed  SARS CORONAVIRUS 2 (TAT 6-24 HRS)    EKG   Radiology DG Chest 2 View  Result Date: 01/29/2020 CLINICAL DATA:  Cough, shortness of breath EXAM: CHEST - 2 VIEW COMPARISON:  01/29/2020 FINDINGS: Lungs are hyperinflated as can be seen with COPD. Small calcified pulmonary nodule in the right middle lobe likely reflecting sequela prior granulomatous disease. No focal consolidation. No pleural effusion or pneumothorax. Heart and mediastinal contours are unremarkable. No acute osseous abnormality. IMPRESSION: No active cardiopulmonary disease. Electronically Signed   By: Kathreen Devoid   On: 01/29/2020 13:14    Procedures Procedures (including critical care time)  Medications Ordered in UC Medications  albuterol (VENTOLIN HFA) 108 (90 Base) MCG/ACT inhaler 2 puff (2 puffs Inhalation Given 01/29/20 1257)  AeroChamber Plus Flo-Vu Medium MISC 1 each (1 each Other Given 01/29/20 1257)    Initial Impression / Assessment and Plan / UC Course  I have reviewed the triage vital signs and the nursing notes.  Pertinent labs & imaging results that were available during my care of the patient were reviewed by me and considered in my medical decision making (see chart for details).     Likely viral upper respiratory infection.  Doubt Covid.  With history of bronchiectasis and some wheezing offered prednisone.  Patient declined.  States it makes her nervous.  Offered antibiotic.  Patient declined.  Michela Pitcher is not usually indicated.  I explained to her that with underlying lung  disease she is at increased risk of infection.  We will give her a prescription for azithromycin to fill and use if she fails to improve over next few days.  Follow-up with primary care Final Clinical Impressions(s) / UC Diagnoses   Final diagnoses:  History of bronchiectasis  Cough     Discharge Instructions     Drink more water Use inhaler as needed Check for COVID test tomorrow Fill and take the antibiotic if you feel worse or fail to improve in a week    ED Prescriptions    Medication Sig Dispense Auth. Provider   azithromycin (ZITHROMAX Z-PAK) 250 MG tablet Take two pills today followed by one a day until gone 6 tablet Meda Coffee Jennette Banker, MD     PDMP not reviewed this encounter.   Raylene Everts, MD 01/29/20 [phone]

## 2020-01-29 NOTE — ED Triage Notes (Signed)
Pt c/o sore throat, sob due to persistent productive coughing with yellow phlegm , fever T max of approx 101.9 yesterday. Also c/o runny nose, congestion, diarrhea,and some nausea last night. Patient took tylenol yesterday and today which helped with fever. Reports that her husband has had similar symptoms for the past week but is getting better. He Was sent to be covid tested yesterday, results pending. Received both doses of covid vaccine. Denies n/v today or cp Pt reports that she is out of her inhaler rx

## 2020-02-10 ENCOUNTER — Ambulatory Visit: Payer: Medicare Other

## 2020-02-13 ENCOUNTER — Ambulatory Visit: Payer: Medicare Other | Attending: Internal Medicine

## 2020-02-13 DIAGNOSIS — Z23 Encounter for immunization: Secondary | ICD-10-CM

## 2020-02-13 NOTE — Progress Notes (Signed)
   Covid-19 Vaccination Clinic  Name:  Julie Montgomery    MRN: [id number] DOB: 1936-03-08  02/13/2020  [name] was observed post Covid-19 immunization for 15 minutes without incident. She was provided with Vaccine Information Sheet and instruction to access the V-Safe system.   [name] was instructed to call [phone] with any severe reactions post vaccine: Marland Kitchen Difficulty breathing  . Swelling of face and throat  . A fast heartbeat  . A bad rash all over body  . Dizziness and weakness

## 2020-02-20 ENCOUNTER — Other Ambulatory Visit: Payer: Self-pay | Admitting: Internal Medicine

## 2020-02-20 DIAGNOSIS — Z1231 Encounter for screening mammogram for malignant neoplasm of breast: Secondary | ICD-10-CM

## 2020-02-23 ENCOUNTER — Other Ambulatory Visit: Payer: Self-pay | Admitting: Internal Medicine

## 2020-02-23 DIAGNOSIS — Z1231 Encounter for screening mammogram for malignant neoplasm of breast: Secondary | ICD-10-CM

## 2020-03-01 NOTE — Telephone Encounter (Signed)
[place] called wanting more information about why she can not have little sedation.   Please call Beth at [phone].

## 2020-03-15 DIAGNOSIS — D1801 Hemangioma of skin and subcutaneous tissue: Secondary | ICD-10-CM | POA: Diagnosis not present

## 2020-03-15 DIAGNOSIS — L814 Other melanin hyperpigmentation: Secondary | ICD-10-CM | POA: Diagnosis not present

## 2020-03-15 DIAGNOSIS — Z85828 Personal history of other malignant neoplasm of skin: Secondary | ICD-10-CM | POA: Diagnosis not present

## 2020-03-15 DIAGNOSIS — M67472 Ganglion, left ankle and foot: Secondary | ICD-10-CM | POA: Diagnosis not present

## 2020-03-15 DIAGNOSIS — L821 Other seborrheic keratosis: Secondary | ICD-10-CM | POA: Diagnosis not present

## 2020-03-30 DIAGNOSIS — J3489 Other specified disorders of nose and nasal sinuses: Secondary | ICD-10-CM | POA: Diagnosis not present

## 2020-03-30 DIAGNOSIS — J479 Bronchiectasis, uncomplicated: Secondary | ICD-10-CM | POA: Diagnosis not present

## 2020-03-31 ENCOUNTER — Ambulatory Visit
Admission: RE | Admit: 2020-03-31 | Discharge: 2020-03-31 | Disposition: A | Discharge status: Home / Self Care | Payer: Medicare Other | Source: Ambulatory Visit | Attending: Internal Medicine | Admitting: Internal Medicine

## 2020-03-31 ENCOUNTER — Other Ambulatory Visit: Payer: Self-pay

## 2020-03-31 DIAGNOSIS — Z1231 Encounter for screening mammogram for malignant neoplasm of breast: Secondary | ICD-10-CM

## 2020-04-19 ENCOUNTER — Ambulatory Visit (INDEPENDENT_AMBULATORY_CARE_PROVIDER_SITE_OTHER): Payer: Medicare Other | Admitting: Internal Medicine

## 2020-04-19 ENCOUNTER — Other Ambulatory Visit: Payer: Self-pay

## 2020-04-19 ENCOUNTER — Encounter: Payer: Self-pay | Admitting: Internal Medicine

## 2020-04-19 DIAGNOSIS — F411 Generalized anxiety disorder: Secondary | ICD-10-CM | POA: Diagnosis not present

## 2020-04-19 DIAGNOSIS — I2510 Atherosclerotic heart disease of native coronary artery without angina pectoris: Secondary | ICD-10-CM | POA: Diagnosis not present

## 2020-04-19 DIAGNOSIS — R413 Other amnesia: Secondary | ICD-10-CM

## 2020-04-19 DIAGNOSIS — R634 Abnormal weight loss: Secondary | ICD-10-CM

## 2020-04-19 DIAGNOSIS — J479 Bronchiectasis, uncomplicated: Secondary | ICD-10-CM | POA: Diagnosis not present

## 2020-04-19 NOTE — Assessment & Plan Note (Signed)
F/u w/dr Elsworth Soho

## 2020-04-19 NOTE — Assessment & Plan Note (Addendum)
Stable wt. Pt had an oral surgery 3 wks ago; dentures She says she needs new dentures again

## 2020-04-19 NOTE — Patient Instructions (Signed)
You can try Lion's Mane Mushroom capsules for memory, focus, neuropathy ([employer] or [employer])   

## 2020-04-19 NOTE — Assessment & Plan Note (Signed)
Lion's Mane Mushroom extract or capsules for memory  

## 2020-04-19 NOTE — Assessment & Plan Note (Signed)
[place] Yeast extract adviced

## 2020-04-19 NOTE — Progress Notes (Signed)
Subjective:  Patient ID: Julie Montgomery, female    DOB: 07/29/1935  Age: 84 y.o. MRN: [id number]  CC: Follow-up (3 month f/u)   HPI Julie Montgomery presents for anxiety, wt loss Pt had an oral surgery 3 wks ago; dentures  Outpatient Medications Prior to Visit  Medication Sig Dispense Refill  . acetaminophen (TYLENOL) 500 MG tablet Take 500 mg by mouth every 6 (six) hours as needed for mild pain.     . Ascorbic Acid (VITAMIN C PO) Take by mouth.    Marland Kitchen aspirin 81 MG EC tablet Take 81 mg by mouth daily. Swallow whole.     . Cholecalciferol (VITAMIN D3) 1000 UNITS CAPS Take 1 capsule by mouth daily. Take total of 2 daily    . LORazepam (ATIVAN) 0.5 MG tablet Take 1 tablet (0.5 mg total) by mouth 2 (two) times daily as needed for anxiety. 30 tablet 1  . azithromycin (ZITHROMAX Z-PAK) 250 MG tablet Take two pills today followed by one a day until gone (Patient not taking: Reported on 04/19/2020) 6 tablet 0   No facility-administered medications prior to visit.    ROS: Review of Systems  Constitutional: Negative for activity change, appetite change, chills, fatigue and unexpected weight change.  HENT: Negative for congestion, mouth sores and sinus pressure.   Eyes: Negative for visual disturbance.  Respiratory: Negative for cough and chest tightness.   Gastrointestinal: Negative for abdominal pain and nausea.  Genitourinary: Negative for difficulty urinating, frequency and vaginal pain.  Musculoskeletal: Negative for back pain and gait problem.  Skin: Negative for pallor and rash.  Neurological: Negative for dizziness, tremors, weakness, numbness and headaches.  Psychiatric/Behavioral: Negative for confusion and sleep disturbance. The patient is nervous/anxious.     Objective:  BP 130/64 (BP Location: Left Arm)   Pulse 71   Temp 98.3 F (36.8 C) (Oral)   Wt 90 lb 9.6 oz (41.1 kg)   SpO2 97%   BMI 16.05 kg/m   BP Readings from Last 3 Encounters:  04/19/20 130/64  01/29/20  (!) 147/60  01/19/20 138/82    Wt Readings from Last 3 Encounters:  04/19/20 90 lb 9.6 oz (41.1 kg)  01/19/20 90 lb (40.8 kg)  12/15/19 88 lb 3.2 oz (40 kg)    Physical Exam Constitutional:      General: She is not in acute distress.    Appearance: She is well-developed.  HENT:     Head: Normocephalic.     Right Ear: External ear normal.     Left Ear: External ear normal.     Nose: Nose normal.     Mouth/Throat:     Mouth: Oropharynx is clear and moist.  Eyes:     General:        Right eye: No discharge.        Left eye: No discharge.     Conjunctiva/sclera: Conjunctivae normal.     Pupils: Pupils are equal, round, and reactive to light.  Neck:     Thyroid: No thyromegaly.     Vascular: No JVD.     Trachea: No tracheal deviation.  Cardiovascular:     Rate and Rhythm: Normal rate and regular rhythm.     Heart sounds: Normal heart sounds.  Pulmonary:     Effort: No respiratory distress.     Breath sounds: No stridor. No wheezing.  Abdominal:     General: Bowel sounds are normal. There is no distension.     Palpations: Abdomen is  soft. There is no mass.     Tenderness: There is no abdominal tenderness. There is no guarding or rebound.  Musculoskeletal:        General: No tenderness or edema.     Cervical back: Normal range of motion and neck supple.  Lymphadenopathy:     Cervical: No cervical adenopathy.  Skin:    Findings: No erythema or rash.  Neurological:     Mental Status: She is oriented to person, place, and time.     Cranial Nerves: No cranial nerve deficit.     Motor: No abnormal muscle tone.     Coordination: Coordination normal.     Deep Tendon Reflexes: Reflexes normal.  Psychiatric:        Mood and Affect: Mood and affect normal.        Behavior: Behavior normal.        Thought Content: Thought content normal.        Judgment: Judgment normal.   Thin  Lab Results  Component Value Date   WBC 6.1 01/18/2020   HGB 12.5 01/18/2020   HCT 37.4  01/18/2020   PLT 203.0 01/18/2020   GLUCOSE 95 01/18/2020   CHOL 203 (H) 01/18/2020   TRIG 104.0 01/18/2020   HDL 69.70 01/18/2020   LDLDIRECT 145.7 05/12/2013   LDLCALC 113 (H) 01/18/2020   ALT 18 01/18/2020   AST 19 01/18/2020   NA 138 01/18/2020   K 4.2 01/18/2020   CL 103 01/18/2020   CREATININE 0.79 01/18/2020   BUN 19 01/18/2020   CO2 29 01/18/2020   TSH 3.00 01/18/2020    MM DIGITAL SCREENING BILATERAL  Result Date: 04/01/2020 CLINICAL DATA:  Screening. EXAM: DIGITAL SCREENING BILATERAL MAMMOGRAM WITH CAD COMPARISON:  Previous exam(s). ACR Breast Density Category c: The breast tissue is heterogeneously dense, which may obscure small masses. FINDINGS: There are no findings suspicious for malignancy. Images were processed with CAD. IMPRESSION: No mammographic evidence of malignancy. A result letter of this screening mammogram will be mailed directly to the patient. RECOMMENDATION: Screening mammogram in one year. (Code:SM-B-01Y) BI-RADS CATEGORY  1: Negative. Electronically Signed   By: Ammie Ferrier M.D.   On: 04/01/2020 16:18    Assessment & Plan:    Walker Kehr, MD

## 2020-04-19 NOTE — Assessment & Plan Note (Signed)
Cont w/low dose lorazepam prn

## 2020-07-06 ENCOUNTER — Other Ambulatory Visit: Payer: Self-pay | Admitting: Ophthalmology

## 2020-07-06 DIAGNOSIS — D23112 Other benign neoplasm of skin of right lower eyelid, including canthus: Secondary | ICD-10-CM | POA: Diagnosis not present

## 2020-07-06 DIAGNOSIS — L919 Hypertrophic disorder of the skin, unspecified: Secondary | ICD-10-CM | POA: Diagnosis not present

## 2020-07-21 ENCOUNTER — Other Ambulatory Visit: Payer: Self-pay

## 2020-07-21 ENCOUNTER — Ambulatory Visit (INDEPENDENT_AMBULATORY_CARE_PROVIDER_SITE_OTHER): Payer: Medicare Other | Admitting: Internal Medicine

## 2020-07-21 ENCOUNTER — Encounter: Payer: Self-pay | Admitting: Internal Medicine

## 2020-07-21 VITALS — BP 130/78 | HR 68 | Temp 98.2°F | Ht 63.0 in | Wt 91.4 lb

## 2020-07-21 DIAGNOSIS — I2510 Atherosclerotic heart disease of native coronary artery without angina pectoris: Secondary | ICD-10-CM | POA: Diagnosis not present

## 2020-07-21 DIAGNOSIS — K589 Irritable bowel syndrome without diarrhea: Secondary | ICD-10-CM | POA: Diagnosis not present

## 2020-07-21 DIAGNOSIS — E785 Hyperlipidemia, unspecified: Secondary | ICD-10-CM | POA: Diagnosis not present

## 2020-07-21 DIAGNOSIS — R634 Abnormal weight loss: Secondary | ICD-10-CM

## 2020-07-21 DIAGNOSIS — R42 Dizziness and giddiness: Secondary | ICD-10-CM | POA: Diagnosis not present

## 2020-07-21 DIAGNOSIS — F4323 Adjustment disorder with mixed anxiety and depressed mood: Secondary | ICD-10-CM

## 2020-07-21 NOTE — Patient Instructions (Addendum)
Imodium, Pepcid - take to [place] trip with you  Use Meclizine as needed Benign Positional Vertigo symptoms  Start [name] - Daroff exercise as dirrected.

## 2020-07-21 NOTE — Progress Notes (Signed)
Subjective:  Patient ID: Julie Montgomery, female    DOB: [birth year]  Age: 85 y.o. MRN: [id number]  CC: Follow-up (3 month f/u)   HPI Julie Montgomery presents for wt loss, diarrhea - no relapse since 11/20, anxiety f/u  Outpatient Medications Prior to Visit  Medication Sig Dispense Refill  . acetaminophen (TYLENOL) 500 MG tablet Take 500 mg by mouth every 6 (six) hours as needed for mild pain.     . Ascorbic Acid (VITAMIN C PO) Take by mouth.    Marland Kitchen aspirin 81 MG EC tablet Take 81 mg by mouth daily. Swallow whole.     . Cholecalciferol (VITAMIN D3) 1000 UNITS CAPS Take 1 capsule by mouth daily. Take total of 2 daily    . LORazepam (ATIVAN) 0.5 MG tablet Take 1 tablet (0.5 mg total) by mouth 2 (two) times daily as needed for anxiety. 30 tablet 1   No facility-administered medications prior to visit.    ROS: Review of Systems  Constitutional: Negative for activity change, appetite change, chills, fatigue and unexpected weight change.  HENT: Negative for congestion, mouth sores and sinus pressure.   Eyes: Negative for visual disturbance.  Respiratory: Negative for cough and chest tightness.   Gastrointestinal: Negative for abdominal pain and nausea.  Genitourinary: Negative for difficulty urinating, frequency and vaginal pain.  Musculoskeletal: Negative for back pain and gait problem.  Skin: Negative for pallor and rash.  Neurological: Negative for dizziness, tremors, weakness, numbness and headaches.  Psychiatric/Behavioral: Negative for confusion and sleep disturbance.    Objective:  BP 130/78 (BP Location: Left Arm)   Pulse 68   Temp 98.2 F (36.8 C) (Oral)   Ht 5\' 3"  (1.6 m)   Wt 91 lb 6.4 oz (41.5 kg)   SpO2 96%   BMI 16.19 kg/m   BP Readings from Last 3 Encounters:  07/21/20 130/78  04/19/20 130/64  01/29/20 (!) 147/60    Wt Readings from Last 3 Encounters:  07/21/20 91 lb 6.4 oz (41.5 kg)  04/19/20 90 lb 9.6 oz (41.1 kg)  01/19/20 90 lb (40.8 kg)     Physical Exam Constitutional:      General: She is not in acute distress.    Appearance: Normal appearance. She is well-developed.  HENT:     Head: Normocephalic.     Right Ear: External ear normal.     Left Ear: External ear normal.     Nose: Nose normal.  Eyes:     General:        Right eye: No discharge.        Left eye: No discharge.     Conjunctiva/sclera: Conjunctivae normal.     Pupils: Pupils are equal, round, and reactive to light.  Neck:     Thyroid: No thyromegaly.     Vascular: No JVD.     Trachea: No tracheal deviation.  Cardiovascular:     Rate and Rhythm: Normal rate and regular rhythm.     Heart sounds: Normal heart sounds.  Pulmonary:     Effort: No respiratory distress.     Breath sounds: No stridor. No wheezing.  Abdominal:     General: Bowel sounds are normal. There is no distension.     Palpations: Abdomen is soft. There is no mass.     Tenderness: There is no abdominal tenderness. There is no guarding or rebound.  Musculoskeletal:        General: No tenderness.     Cervical back: Normal range  of motion and neck supple.  Lymphadenopathy:     Cervical: No cervical adenopathy.  Skin:    Findings: No erythema or rash.  Neurological:     Mental Status: She is oriented to person, place, and time.     Cranial Nerves: No cranial nerve deficit.     Motor: No abnormal muscle tone.     Coordination: Coordination normal.     Deep Tendon Reflexes: Reflexes normal.  Psychiatric:        Behavior: Behavior normal.        Thought Content: Thought content normal.        Judgment: Judgment normal.   Thin  Lab Results  Component Value Date   WBC 6.1 01/18/2020   HGB 12.5 01/18/2020   HCT 37.4 01/18/2020   PLT 203.0 01/18/2020   GLUCOSE 95 01/18/2020   CHOL 203 (H) 01/18/2020   TRIG 104.0 01/18/2020   HDL 69.70 01/18/2020   LDLDIRECT 145.7 05/12/2013   LDLCALC 113 (H) 01/18/2020   ALT 18 01/18/2020   AST 19 01/18/2020   NA 138 01/18/2020   K 4.2  01/18/2020   CL 103 01/18/2020   CREATININE 0.79 01/18/2020   BUN 19 01/18/2020   CO2 29 01/18/2020   TSH 3.00 01/18/2020    MM DIGITAL SCREENING BILATERAL  Result Date: 04/01/2020 CLINICAL DATA:  Screening. EXAM: DIGITAL SCREENING BILATERAL MAMMOGRAM WITH CAD COMPARISON:  Previous exam(s). ACR Breast Density Category c: The breast tissue is heterogeneously dense, which may obscure small masses. FINDINGS: There are no findings suspicious for malignancy. Images were processed with CAD. IMPRESSION: No mammographic evidence of malignancy. A result letter of this screening mammogram will be mailed directly to the patient. RECOMMENDATION: Screening mammogram in one year. (Code:SM-B-01Y) BI-RADS CATEGORY  1: Negative. Electronically Signed   By: Ammie Ferrier M.D.   On: 04/01/2020 16:18    Assessment & Plan:    Follow-up: No follow-ups on file.  Walker Kehr, MD

## 2020-07-21 NOTE — Assessment & Plan Note (Signed)
Better  

## 2020-07-21 NOTE — Assessment & Plan Note (Signed)
Lorazepam prn 

## 2020-07-21 NOTE — Assessment & Plan Note (Signed)
Imodium, Pepcid - take to [place] trip with you

## 2020-07-21 NOTE — Assessment & Plan Note (Addendum)
Use Meclizine as needed Benign Positional Vertigo symptoms  Start [name] - Daroff exercise as dirrected.

## 2020-07-21 NOTE — Assessment & Plan Note (Signed)
Pt declined statins Coronary calcium score of 16. This was 25 rd percentile for age and sex matched control.

## 2020-10-24 DIAGNOSIS — D485 Neoplasm of uncertain behavior of skin: Secondary | ICD-10-CM | POA: Diagnosis not present

## 2020-10-24 DIAGNOSIS — L570 Actinic keratosis: Secondary | ICD-10-CM | POA: Diagnosis not present

## 2020-10-24 DIAGNOSIS — B078 Other viral warts: Secondary | ICD-10-CM | POA: Diagnosis not present

## 2020-10-24 DIAGNOSIS — L821 Other seborrheic keratosis: Secondary | ICD-10-CM | POA: Diagnosis not present

## 2020-10-24 DIAGNOSIS — Z85828 Personal history of other malignant neoplasm of skin: Secondary | ICD-10-CM | POA: Diagnosis not present

## 2020-10-24 DIAGNOSIS — D044 Carcinoma in situ of skin of scalp and neck: Secondary | ICD-10-CM | POA: Diagnosis not present

## 2021-01-03 ENCOUNTER — Telehealth: Payer: Self-pay | Admitting: Internal Medicine

## 2021-01-03 MED ORDER — MECLIZINE HCL 12.5 MG PO TABS: 12.5000 mg | ORAL_TABLET | Freq: Three times a day (TID) | ORAL | 1 refills | Status: DC | PRN

## 2021-01-03 NOTE — Telephone Encounter (Signed)
Reviewed chart pt is up-to-date sent refills to [facility].../[facility]  

## 2021-01-03 NOTE — Telephone Encounter (Signed)
1.Medication Requested: meclizine (ANTIVERT) 12.5 MG tablet   2. Pharmacy (Name, [place]): [id number] [place] [address] [place] [address] Phone:  [phone]:  [phone]  3. On Med List: no  4. Last Visit with PCP: 03.24.2022  5. Next visit date with PCP: n/a  **Advised patient appt more than likely would be needed before rx could be filled**   Agent: Please be advised that RX refills may take up to 3 business days. We ask that you follow-up with your pharmacy.

## 2021-01-30 DIAGNOSIS — L570 Actinic keratosis: Secondary | ICD-10-CM | POA: Diagnosis not present

## 2021-01-30 DIAGNOSIS — D485 Neoplasm of uncertain behavior of skin: Secondary | ICD-10-CM | POA: Diagnosis not present

## 2021-01-30 DIAGNOSIS — Z85828 Personal history of other malignant neoplasm of skin: Secondary | ICD-10-CM | POA: Diagnosis not present

## 2021-02-01 ENCOUNTER — Encounter (HOSPITAL_COMMUNITY): Payer: Self-pay

## 2021-02-01 ENCOUNTER — Emergency Department (HOSPITAL_COMMUNITY): Payer: Medicare Other

## 2021-02-01 ENCOUNTER — Emergency Department (HOSPITAL_COMMUNITY)
Admission: EM | Admit: 2021-02-01 | Discharge: 2021-02-02 | Disposition: A | Discharge status: Home / Self Care | Payer: Medicare Other | Attending: Emergency Medicine | Admitting: Emergency Medicine

## 2021-02-01 ENCOUNTER — Other Ambulatory Visit: Payer: Self-pay

## 2021-02-01 DIAGNOSIS — Z7982 Long term (current) use of aspirin: Secondary | ICD-10-CM | POA: Diagnosis not present

## 2021-02-01 DIAGNOSIS — M7731 Calcaneal spur, right foot: Secondary | ICD-10-CM | POA: Diagnosis not present

## 2021-02-01 DIAGNOSIS — M25571 Pain in right ankle and joints of right foot: Secondary | ICD-10-CM | POA: Diagnosis not present

## 2021-02-01 DIAGNOSIS — L03115 Cellulitis of right lower limb: Secondary | ICD-10-CM | POA: Insufficient documentation

## 2021-02-01 DIAGNOSIS — Z85828 Personal history of other malignant neoplasm of skin: Secondary | ICD-10-CM | POA: Insufficient documentation

## 2021-02-01 NOTE — ED Triage Notes (Signed)
Pt complains of right ankle pain since this morning that has gotten worse throughout the day. Pt's right ankle is swollen and red.

## 2021-02-01 NOTE — ED Provider Notes (Signed)
Emergency Medicine Provider Triage Evaluation Note  Julie Montgomery , a 85 y.o. female  was evaluated in triage.  Pt complains of right ankle pain and swelling that began earlier today.  Symptoms have been worsening.  Denies any known trauma to the ankle.  Denies any fevers or vomiting.  States her pain worsens with ambulation and now notes her pain is so severe that she cannot bear weight with the right foot.  Physical Exam  BP (!) 176/72 (BP Location: Left Arm)   Pulse 82   Temp 98.1 F (36.7 C) (Oral)   Resp 14   Ht 5\' 3"  (1.6 m)   Wt 38.6 kg   SpO2 98%   BMI 15.06 kg/m  Gen:   Awake, no distress   Resp:  Normal effort  MSK:   Moves extremities without difficulty  Other:  Circumferential edema noted in the right ankle.  Erythema along the posterior ankle that appears worst along the Achilles tendon.  Pain with palpation of the ankle.  Worsening pain with forced dorsiflexion of the right foot.  2+ pedal pulses.  Medical Decision Making  Medically screening exam initiated at 9:36 PM.  Appropriate orders placed.  ORLENE SALMONS was informed that the remainder of the evaluation will be completed by another provider, this initial triage assessment does not replace that evaluation, and the importance of remaining in the ED until their evaluation is complete.   Rayna Sexton, PA-C 02/01/21 2137    Luna Fuse, MD 02/03/21 1529

## 2021-02-02 DIAGNOSIS — M25571 Pain in right ankle and joints of right foot: Secondary | ICD-10-CM | POA: Diagnosis not present

## 2021-02-02 DIAGNOSIS — L03115 Cellulitis of right lower limb: Secondary | ICD-10-CM | POA: Diagnosis not present

## 2021-02-02 LAB — CBC WITH DIFFERENTIAL/PLATELET
Abs Immature Granulocytes: 0.01 10*3/uL (ref 0.00–0.07)
Basophils Absolute: 0.1 10*3/uL (ref 0.0–0.1)
Basophils Relative: 1 %
Eosinophils Absolute: 0.2 10*3/uL (ref 0.0–0.5)
Eosinophils Relative: 3 %
HCT: 38.6 % (ref 36.0–46.0)
Hemoglobin: 12.8 g/dL (ref 12.0–15.0)
Immature Granulocytes: 0 %
Lymphocytes Relative: 24 %
Lymphs Abs: 1.7 10*3/uL (ref 0.7–4.0)
MCH: 32.1 pg (ref 26.0–34.0)
MCHC: 33.2 g/dL (ref 30.0–36.0)
MCV: 96.7 fL (ref 80.0–100.0)
Monocytes Absolute: 0.6 10*3/uL (ref 0.1–1.0)
Monocytes Relative: 8 %
Neutro Abs: 4.6 10*3/uL (ref 1.7–7.7)
Neutrophils Relative %: 64 %
Platelets: 193 10*3/uL (ref 150–400)
RBC: 3.99 MIL/uL (ref 3.87–5.11)
RDW: 12.6 % (ref 11.5–15.5)
WBC: 7.2 10*3/uL (ref 4.0–10.5)
nRBC: 0 % (ref 0.0–0.2)

## 2021-02-02 LAB — COMPREHENSIVE METABOLIC PANEL
ALT: 18 U/L (ref 0–44)
AST: 21 U/L (ref 15–41)
Albumin: 4.3 g/dL (ref 3.5–5.0)
Alkaline Phosphatase: 103 U/L (ref 38–126)
Anion gap: 4 — ABNORMAL LOW (ref 5–15)
BUN: 23 mg/dL (ref 8–23)
CO2: 28 mmol/L (ref 22–32)
Calcium: 9.6 mg/dL (ref 8.9–10.3)
Chloride: 104 mmol/L (ref 98–111)
Creatinine, Ser: 0.55 mg/dL (ref 0.44–1.00)
GFR, Estimated: >60 mL/min (ref >60)
Glucose, Bld: 103 mg/dL — ABNORMAL HIGH (ref 70–99)
Potassium: 4.2 mmol/L (ref 3.5–5.1)
Sodium: 136 mmol/L (ref 135–145)
Total Bilirubin: 0.6 mg/dL (ref 0.3–1.2)
Total Protein: 7 g/dL (ref 6.5–8.1)

## 2021-02-02 LAB — LACTIC ACID, PLASMA: Lactic Acid, Venous: 0.5 mmol/L (ref 0.5–1.9)

## 2021-02-02 MED ORDER — CEPHALEXIN 500 MG PO CAPS: 500.0000 mg | ORAL_CAPSULE | Freq: Three times a day (TID) | ORAL | 0 refills | Status: AC

## 2021-02-02 MED ORDER — PREDNISONE 10 MG PO TABS: 10.0000 mg | ORAL_TABLET | Freq: Every day | ORAL | 0 refills | Status: AC

## 2021-02-02 MED ORDER — ACETAMINOPHEN 500 MG PO TABS
1000.0000 mg | ORAL_TABLET | Freq: Once | ORAL | Status: AC
  Administered 2021-02-02: 1000 mg via ORAL
  Filled 2021-02-02: qty 2

## 2021-02-02 NOTE — ED Provider Notes (Signed)
[facility] DEPT Provider Note   CSN: [id number] Arrival date & time: 02/01/21  2119     History Chief Complaint  Patient presents with   Ankle Pain    Julie Montgomery is a 85 y.o. female who presents to the ED today with complaint of gradual onset, constant, sharp/throbbing, R ankle pain that began earlier today.  Patient denies any trauma to the ankle.  She reports that she noticed there was some slight swelling and redness to the ankle earlier today and she thought that she was bitten by a insect.  He did not feel an insect bite her.  She reports she has never had issues like this in the past.  She denies any fevers however feels like her right ankle is slightly warm to the touch.  Has not taken anything specifically for pain.  She denies history of gout.  She denies heavy shellfish, pork, red meat, alcohol diet.   The history is provided by the patient and medical records.      Past Medical History:  Diagnosis Date   Adenomatous polyp of colon 2020   Allergy    Anxiety    Basal cell carcinoma (BCC)    Cataract    Depression    Helicobacter pylori gastritis    Osteopenia    Pneumonia    hx of 03/2008   Stroke [facility])    TIA long time ago    Patient Active Problem List   Diagnosis Date Noted   Memory problem 01/19/2020   Gastroenteritis 03/23/2019   IBS (irritable bowel syndrome) 01/19/2019   Coronary atherosclerosis 06/11/2018   Sternoclavicular joint pain, right 06/12/2017   Left ulnar fracture 09/06/2016   Carotid bruit 02/17/2016   Stress at home 10/18/2015   Swelling of foot joint 10/14/2014   Clostridium difficile colitis 10/04/2014   Diarrhea 10/04/2014   Numbness of left hand 04/19/2014   Lipoma of arm 01/23/2012   Well adult exam 04/03/2011   Edema 02/13/2011   Shoulder pain, right 01/30/2011   Bronchiectasis ([place]) 01/23/2011   Vertigo 09/13/2010   Allergic rhinitis 09/13/2010   CONSTIPATION 03/22/2010   LUMBAGO  03/22/2010   URINARY FREQUENCY, CHRONIC 03/22/2010   Situational mixed anxiety and depressive disorder 02/25/2008   Cough 02/17/2008   Anxiety disorder 02/07/2007   INSOMNIA-SLEEP DISORDER-UNSPEC 02/07/2007   Weight loss 02/07/2007   Dyslipidemia 11/21/2006   OSTEOPENIA 11/21/2006   ALLERGY 11/21/2006    Past Surgical History:  Procedure Laterality Date   APPENDECTOMY     BREAST LUMPECTOMY     right   CATARACT EXTRACTION W/ INTRAOCULAR LENS  IMPLANT, BILATERAL Bilateral    CESAREAN SECTION     CHOLECYSTECTOMY     MOHS SURGERY     PARTIAL HYSTERECTOMY     ROTATOR CUFF REPAIR  2012   SKIN GRAFT     teeth remmoved       OB History   No obstetric history on file.     Family History  Problem Relation Age of Onset   Diabetes Sister    Cancer Sister        lung ca   Coronary artery disease Brother    Dementia Brother    Cancer Brother        lung ca   Stomach cancer Paternal Grandmother    Colon cancer Son    Breast cancer Neg Hx    Esophageal cancer Neg Hx    Rectal cancer Neg Hx  Social History   Tobacco Use   Smoking status: Never   Smokeless tobacco: Never  Vaping Use   Vaping Use: Never used  Substance Use Topics   Alcohol use: No   Drug use: No    Home Medications Prior to Admission medications   Medication Sig Start Date End Date Taking? Authorizing Provider  cephALEXin (KEFLEX) 500 MG capsule Take 1 capsule (500 mg total) by mouth 3 (three) times daily for 5 days. 02/02/21 02/07/21 Yes Danna Casella, PA-C  predniSONE (DELTASONE) 10 MG tablet Take 1 tablet (10 mg total) by mouth daily for 5 days. 02/02/21 02/07/21 Yes Leona Alen, PA-C  acetaminophen (TYLENOL) 500 MG tablet Take 500 mg by mouth every 6 (six) hours as needed for mild pain.     [provider]  Ascorbic Acid (VITAMIN C PO) Take by mouth.    [provider]  aspirin 81 MG EC tablet Take 81 mg by mouth daily. Swallow whole.     [provider]   Cholecalciferol (VITAMIN D3) 1000 UNITS CAPS Take 1 capsule by mouth daily. Take total of 2 daily    [provider]  LORazepam (ATIVAN) 0.5 MG tablet Take 1 tablet (0.5 mg total) by mouth 2 (two) times daily as needed for anxiety. 01/19/20   Plotnikov, Evie Lacks, MD  meclizine (ANTIVERT) 12.5 MG tablet Take 1 tablet (12.5 mg total) by mouth 3 (three) times daily as needed for dizziness. 01/03/21 01/03/22  Plotnikov, Evie Lacks, MD    Allergies    Fluoxetine hcl, Lovastatin, Mometasone furo-formoterol fum, and Sulfa antibiotics  Review of Systems   Review of Systems  Constitutional:  Negative for chills and fever.  Musculoskeletal:  Positive for arthralgias and joint swelling.  Skin:  Positive for color change.  All other systems reviewed and are negative.  Physical Exam Updated Vital Signs BP 123/72   Pulse 64   Temp 98.1 F (36.7 C) (Oral)   Resp 16   Ht 5\' 3"  (1.6 m)   Wt 38.6 kg   SpO2 97%   BMI 15.06 kg/m   Physical Exam Vitals and nursing note reviewed.  Constitutional:      Appearance: She is not ill-appearing.  HENT:     Head: Normocephalic and atraumatic.  Eyes:     Conjunctiva/sclera: Conjunctivae normal.  Cardiovascular:     Rate and Rhythm: Normal rate and regular rhythm.  Pulmonary:     Effort: Pulmonary effort is normal.     Breath sounds: Normal breath sounds.  Abdominal:     Palpations: Abdomen is soft.     Tenderness: There is no abdominal tenderness.  Musculoskeletal:     Cervical back: Neck supple.     Comments: Circumferential edema noted in the right ankle.  Erythema along the posterior ankle that appears worst along the Achilles tendon.  Pain with palpation of the ankle.  Worsening pain with forced dorsiflexion of the right foot.  2+ pedal pulses.  Skin:    General: Skin is warm and dry.  Neurological:     Mental Status: She is alert.    ED Results / Procedures / Treatments   Labs (all labs ordered are listed, but only abnormal results  are displayed) Labs Reviewed  COMPREHENSIVE METABOLIC PANEL - Abnormal; Notable for the following components:      Result Value   Glucose, Bld 103 (*)    Anion gap 4 (*)    All other components within normal limits  CBC WITH DIFFERENTIAL/PLATELET  LACTIC ACID, PLASMA  LACTIC ACID, PLASMA    EKG None  Radiology DG Ankle Complete Right  Result Date: 02/01/2021 CLINICAL DATA:  Developing cellulitis, initial encounter EXAM: RIGHT ANKLE - COMPLETE 3+ VIEW COMPARISON:  None. FINDINGS: No acute fracture or dislocation is noted. Calcaneal spurring is noted. No soft tissue swelling is seen. IMPRESSION: No acute abnormality noted. Electronically Signed   By: Inez Catalina M.D.   On: 02/01/2021 22:26    Procedures Procedures   Medications Ordered in ED Medications  acetaminophen (TYLENOL) tablet 1,000 mg (1,000 mg Oral Given 02/02/21 0210)    ED Course  I have reviewed the triage vital signs and the nursing notes.  Pertinent labs & imaging results that were available during my care of the patient were reviewed by me and considered in my medical decision making (see chart for details).    MDM Rules/Calculators/A&P                           85 year old female who presents to the ED today with complaint of atraumatic right ankle pain that began earlier today.  Has also noticed swelling and redness to same.  On arrival to the ED vitals are stable.  Patient was medically screened and work-up started including an x-ray which did not show any acute findings.  Labs pending at this time.  On my exam patient with swelling, redness, tenderness palpation along the posterior aspect of the ankle extending into the Achilles tendon with limited range of motion secondary to pain.  Neurovascularly intact.  We will plan for labs at this time including CBC, CMP, lactic.  Question cellulitis versus gout.  Lower concern for septic arthritis at this time given overall well appearance.   CBC without leukocytosis.  Hgb stable at 12.8 CMP without electrolyte abnormalities Lactic acid WNL 0.5  Workup overall reassuring at this time. On reevaluation after tylenol pt reports improvement in pain. Will discharge home at this time with keflex and small course of prednisone. Pt instructed to follow up with PCP for further eval next week. She is in agreement with plan and stable for discharge home.   This note was prepared using Dragon voice recognition software and may include unintentional dictation errors due to the inherent limitations of voice recognition software.   Final Clinical Impression(s) / ED Diagnoses Final diagnoses:  Acute right ankle pain  Cellulitis of right lower extremity    Rx / DC Orders ED Discharge Orders          Ordered    cephALEXin (KEFLEX) 500 MG capsule  3 times daily        02/02/21 0313    predniSONE (DELTASONE) 10 MG tablet  Daily        02/02/21 0313             Discharge Instructions      Please pick up medications and take as prescribed  While at home please rest, ice, and elevate your ankle. Take Tylenol as needed for pain.   Follow up with your PCP for further evaluation next week  Return to the ED for any new/worsening symptoms       Eustaquio Maize, PA-C 02/02/21 0314    Drenda Freeze, MD 02/02/21 [phone]

## 2021-02-02 NOTE — Discharge Instructions (Addendum)
Please pick up medications and take as prescribed  While at home please rest, ice, and elevate your ankle. Take Tylenol as needed for pain.   Follow up with your PCP for further evaluation next week  Return to the ED for any new/worsening symptoms

## 2021-02-14 ENCOUNTER — Encounter: Payer: Self-pay | Admitting: Internal Medicine

## 2021-02-14 ENCOUNTER — Other Ambulatory Visit: Payer: Self-pay

## 2021-02-14 ENCOUNTER — Ambulatory Visit (INDEPENDENT_AMBULATORY_CARE_PROVIDER_SITE_OTHER): Payer: Medicare Other | Admitting: Internal Medicine

## 2021-02-14 VITALS — BP 132/74 | HR 68 | Temp 98.0°F | Ht 63.0 in | Wt 87.2 lb

## 2021-02-14 DIAGNOSIS — R634 Abnormal weight loss: Secondary | ICD-10-CM

## 2021-02-14 DIAGNOSIS — R413 Other amnesia: Secondary | ICD-10-CM

## 2021-02-14 DIAGNOSIS — M67971 Unspecified disorder of synovium and tendon, right ankle and foot: Secondary | ICD-10-CM

## 2021-02-14 DIAGNOSIS — F4323 Adjustment disorder with mixed anxiety and depressed mood: Secondary | ICD-10-CM | POA: Diagnosis not present

## 2021-02-14 DIAGNOSIS — Z23 Encounter for immunization: Secondary | ICD-10-CM

## 2021-02-14 DIAGNOSIS — I2510 Atherosclerotic heart disease of native coronary artery without angina pectoris: Secondary | ICD-10-CM

## 2021-02-14 MED ORDER — MELOXICAM 7.5 MG PO TABS: 7.5000 mg | ORAL_TABLET | Freq: Every day | ORAL | 0 refills | Status: DC

## 2021-02-14 MED ORDER — MEGESTROL ACETATE 40 MG PO TABS: 40.0000 mg | ORAL_TABLET | Freq: Every day | ORAL | 3 refills | Status: DC

## 2021-02-14 NOTE — Assessment & Plan Note (Signed)
Worse. Discussed. Options to treat were reviewed w/pt

## 2021-02-14 NOTE — Assessment & Plan Note (Signed)
Tendinitis - new. Use ice. Get a 1/4 inch heel lift Take Meloxicam

## 2021-02-14 NOTE — Progress Notes (Signed)
Subjective:  Patient ID: Julie Montgomery, female    DOB: August 21, 1935  Age: 85 y.o. MRN: [id number]  CC: Follow-up (ER FOLLOW-UP)   HPI Julie Montgomery presents for inability to gain wt, anxiety. C/o memory difficulties  S/p ER visit on 02/01/21: R ankle pain that began earlier today.  Patient denies any trauma to the ankle.  She reports that she noticed there was some slight swelling and redness to the ankle earlier today and she thought that she was bitten by a insect.  She did not feel an insect bite her.  She reports she has never had issues like this in the past. Pain resolved w/Prednisone  Outpatient Medications Prior to Visit  Medication Sig Dispense Refill   Ascorbic Acid (VITAMIN C PO) Take by mouth.     Cholecalciferol (VITAMIN D3) 1000 UNITS CAPS Take 1 capsule by mouth daily. Take total of 2 daily     acetaminophen (TYLENOL) 500 MG tablet Take 500 mg by mouth every 6 (six) hours as needed for mild pain.  (Patient not taking: Reported on 02/14/2021)     aspirin 81 MG EC tablet Take 81 mg by mouth daily. Swallow whole.  (Patient not taking: Reported on 02/14/2021)     LORazepam (ATIVAN) 0.5 MG tablet Take 1 tablet (0.5 mg total) by mouth 2 (two) times daily as needed for anxiety. (Patient not taking: Reported on 02/14/2021) 30 tablet 1   meclizine (ANTIVERT) 12.5 MG tablet Take 1 tablet (12.5 mg total) by mouth 3 (three) times daily as needed for dizziness. (Patient not taking: Reported on 02/14/2021) 60 tablet 1   No facility-administered medications prior to visit.    ROS: Review of Systems  Constitutional:  Positive for appetite change and unexpected weight change. Negative for activity change, chills and fatigue.  HENT:  Negative for congestion, mouth sores and sinus pressure.   Eyes:  Negative for visual disturbance.  Respiratory:  Negative for cough and chest tightness.   Gastrointestinal:  Negative for abdominal pain and nausea.  Genitourinary:  Negative for difficulty  urinating, frequency and vaginal pain.  Musculoskeletal:  Negative for back pain and gait problem.  Skin:  Negative for pallor and rash.  Neurological:  Negative for dizziness, tremors, weakness, numbness and headaches.  Psychiatric/Behavioral:  Positive for decreased concentration. Negative for confusion and sleep disturbance. The patient is nervous/anxious.    Objective:  BP 132/74 (BP Location: Left Arm)   Pulse 68   Temp 98 F (36.7 C) (Oral)   Ht 5\' 3"  (1.6 m)   Wt 87 lb 3.2 oz (39.6 kg)   SpO2 95%   BMI 15.45 kg/m   BP Readings from Last 3 Encounters:  02/14/21 132/74  02/02/21 138/68  07/21/20 130/78    Wt Readings from Last 3 Encounters:  02/14/21 87 lb 3.2 oz (39.6 kg)  02/01/21 85 lb (38.6 kg)  07/21/20 91 lb 6.4 oz (41.5 kg)    Physical Exam Constitutional:      General: She is not in acute distress.    Appearance: She is well-developed.  HENT:     Head: Normocephalic.     Right Ear: External ear normal.     Left Ear: External ear normal.     Nose: Nose normal.  Eyes:     General:        Right eye: No discharge.        Left eye: No discharge.     Conjunctiva/sclera: Conjunctivae normal.  Pupils: Pupils are equal, round, and reactive to light.  Neck:     Thyroid: No thyromegaly.     Vascular: No JVD.     Trachea: No tracheal deviation.  Cardiovascular:     Rate and Rhythm: Normal rate and regular rhythm.     Heart sounds: Normal heart sounds.  Pulmonary:     Effort: No respiratory distress.     Breath sounds: No stridor. No wheezing.  Abdominal:     General: Bowel sounds are normal. There is no distension.     Palpations: Abdomen is soft. There is no mass.     Tenderness: There is no abdominal tenderness. There is no guarding or rebound.  Musculoskeletal:        General: No tenderness.     Cervical back: Normal range of motion and neck supple. No rigidity.  Lymphadenopathy:     Cervical: No cervical adenopathy.  Skin:    Findings: No  erythema or rash.  Neurological:     Cranial Nerves: No cranial nerve deficit.     Motor: No abnormal muscle tone.     Coordination: Coordination normal.     Deep Tendon Reflexes: Reflexes normal.  Psychiatric:        Behavior: Behavior normal.        Thought Content: Thought content normal.        Judgment: Judgment normal.  Tearful, anxous Thin R posterior heel and Achilles tendon are swollen R upper leg blister and erythema - a bite  Lab Results  Component Value Date   WBC 7.2 02/02/2021   HGB 12.8 02/02/2021   HCT 38.6 02/02/2021   PLT 193 02/02/2021   GLUCOSE 103 (H) 02/02/2021   CHOL 203 (H) 01/18/2020   TRIG 104.0 01/18/2020   HDL 69.70 01/18/2020   LDLDIRECT 145.7 05/12/2013   LDLCALC 113 (H) 01/18/2020   ALT 18 02/02/2021   AST 21 02/02/2021   NA 136 02/02/2021   K 4.2 02/02/2021   CL 104 02/02/2021   CREATININE 0.55 02/02/2021   BUN 23 02/02/2021   CO2 28 02/02/2021   TSH 3.00 01/18/2020    DG Ankle Complete Right  Result Date: 02/01/2021 CLINICAL DATA:  Developing cellulitis, initial encounter EXAM: RIGHT ANKLE - COMPLETE 3+ VIEW COMPARISON:  None. FINDINGS: No acute fracture or dislocation is noted. Calcaneal spurring is noted. No soft tissue swelling is seen. IMPRESSION: No acute abnormality noted. Electronically Signed   By: Inez Catalina M.D.   On: 02/01/2021 22:26    Assessment & Plan:   Problem List Items Addressed This Visit     Achilles tendon disorder, right    Tendinitis - new. Use ice. Get a 1/4 inch heel lift Take Meloxicam      Memory problem    Worse - probable dementia. Can resume Lion's mane. Other treatment options were reviewed      Situational mixed anxiety and depressive disorder    Worse. Discussed. Options to treat were reviewed w/pt      Weight loss    Not better. Will try Megace po      Other Visit Diagnoses     Needs flu shot    -  Primary   Relevant Orders   Flu Vaccine QUAD High Dose(Fluad) (Completed)          Follow-up: Return in about 6 weeks (around 03/28/2021) for a follow-up visit.  Walker Kehr, MD

## 2021-02-14 NOTE — Assessment & Plan Note (Signed)
Not better. Will try Megace po

## 2021-02-14 NOTE — Assessment & Plan Note (Signed)
Worse - probable dementia. Can resume Lion's mane. Other treatment options were reviewed

## 2021-02-14 NOTE — Patient Instructions (Addendum)
You can resume [place] man[place].   Use ice on the heel. Get a 1/4 inch heel lift    Achilles Tendinitis Achilles tendinitis is inflammation of the tough, cord-like band that attaches the lower leg muscles to the heel bone (Achilles tendon). This is usually caused by overusing the tendon and the ankle joint. Achilles tendinitis usually gets better over time with treatment and caring for yourself at home. It can take weeks or months to heal completely. What are the causes? This condition may be caused by: A sudden increase in exercise or activity, such as running. Doing the same exercises or activities, such as jumping, over and over. Not warming up calf muscles before exercising. Exercising in shoes that are worn out or not made for exercise. Having arthritis or a bone growth (spur) on the back of the heel bone. This can rub against the tendon and hurt it. Age-related wear and tear. Tendons become less flexible with age and are more likely to be injured. What are the signs or symptoms? Common symptoms of this condition include: Pain in the Achilles tendon or in the back of the leg, just above the heel. The pain usually gets worse with exercise. Stiffness or soreness in the back of the leg, especially in the morning. Swelling of the skin over the Achilles tendon. Thickening of the tendon. Trouble standing on tiptoe. How is this diagnosed? This condition is diagnosed based on your symptoms and a physical exam. You may have tests, including: X-rays. MRI. How is this treated? The goal of treatment is to relieve symptoms and help your injury heal. Treatment may include: Decreasing or stopping activities that caused the tendinitis. This may mean switching to low-impact exercises like biking or swimming. Icing the injured area. Doing physical therapy, including strengthening and stretching exercises. Taking NSAIDs, such as ibuprofen, to help relieve pain and swelling. Using supportive shoes,  wraps, heel lifts, or a walking boot (air cast). Having surgery. This may be done if your symptoms do not improve after other treatments. Using high-energy shock wave impulses to stimulate the healing process (extracorporeal shock wave therapy). This is rare. Having an injection of medicines that help relieve inflammation (corticosteroids). This is rare. Follow these instructions at home: If you have an air cast: Wear the air cast as told by your health care provider. Remove it only as told by your health care provider. Loosen it if your toes tingle, become numb, or turn cold and blue. Keep it clean. If the air cast is not waterproof: Do not let it get wet. Cover it with a watertight covering when you take a bath or shower. Managing pain, stiffness, and swelling  If directed, put ice on the injured area. To do this: If you have a removable air cast, remove it as told by your health care provider. Put ice in a plastic bag. Place a towel between your skin and the bag. Leave the ice on for 20 minutes, 2-3 times a day. Move your toes often to reduce stiffness and swelling. Raise (elevate) your foot above the level of your heart while you are sitting or lying down. Activity Gradually return to your normal activities as told by your health care provider. Ask your health care provider what activities are safe for you. Do not do activities that cause pain. Consider doing low-impact exercises, like cycling or swimming. Ask your health care provider when it is safe to drive if you have an air cast on your foot. If physical therapy  was prescribed, do exercises as told by your health care provider or physical therapist. General instructions If directed, wrap your foot with an elastic bandage or other wrap. This can help to keep your tendon from moving too much while it heals. Your health care provider will show you how to wrap your foot correctly. Wear supportive shoes or heel lifts only as told by  your health care provider. Take over-the-counter and prescription medicines only as told by your health care provider. Keep all follow-up visits as told by your health care provider. This is important. Contact a health care provider if you: Have symptoms that get worse. Have pain that does not get better with medicine. Develop new, unexplained symptoms. Develop warmth and swelling in your foot. Have a fever. Get help right away if you: Have a sudden popping sound or sensation in your Achilles tendon followed by severe pain. Cannot move your toes or foot. Cannot put any weight on your foot. Your foot or toes become numb and look white or blue even after loosening your bandage or air cast. Summary Achilles tendinitis is inflammation of the tough, cord-like band that attaches the lower leg muscles to the heel bone (Achilles tendon). This condition is usually caused by overusing the tendon and the ankle joint. It can also be caused by arthritis or normal aging. The most common symptoms of this condition include pain, swelling, or stiffness in the Achilles tendon or in the back of the leg. This condition is usually treated by decreasing or stopping activities that caused the tendinitis, icing the injured area, taking NSAIDs, and doing physical therapy. This information is not intended to replace advice given to you by your health care provider. Make sure you discuss any questions you have with your health care provider. Document Revised: 09/01/2018 Document Reviewed: 09/01/2018 Elsevier Patient Education  [place]. heel. Get a 1/4 inch heel lift

## 2021-02-17 ENCOUNTER — Other Ambulatory Visit: Payer: Self-pay | Admitting: Internal Medicine

## 2021-02-17 DIAGNOSIS — Z1231 Encounter for screening mammogram for malignant neoplasm of breast: Secondary | ICD-10-CM

## 2021-02-23 DIAGNOSIS — Z1211 Encounter for screening for malignant neoplasm of colon: Secondary | ICD-10-CM | POA: Diagnosis not present

## 2021-02-23 DIAGNOSIS — Z1231 Encounter for screening mammogram for malignant neoplasm of breast: Secondary | ICD-10-CM | POA: Diagnosis not present

## 2021-02-23 DIAGNOSIS — Z681 Body mass index (BMI) 19 or less, adult: Secondary | ICD-10-CM | POA: Diagnosis not present

## 2021-02-23 DIAGNOSIS — K644 Residual hemorrhoidal skin tags: Secondary | ICD-10-CM | POA: Diagnosis not present

## 2021-02-23 DIAGNOSIS — E559 Vitamin D deficiency, unspecified: Secondary | ICD-10-CM | POA: Diagnosis not present

## 2021-02-23 DIAGNOSIS — Z01419 Encounter for gynecological examination (general) (routine) without abnormal findings: Secondary | ICD-10-CM | POA: Diagnosis not present

## 2021-02-23 DIAGNOSIS — Z463 Encounter for fitting and adjustment of dental prosthetic device: Secondary | ICD-10-CM | POA: Diagnosis not present

## 2021-02-23 DIAGNOSIS — R64 Cachexia: Secondary | ICD-10-CM | POA: Diagnosis not present

## 2021-02-23 DIAGNOSIS — M810 Age-related osteoporosis without current pathological fracture: Secondary | ICD-10-CM | POA: Diagnosis not present

## 2021-02-27 ENCOUNTER — Other Ambulatory Visit: Payer: Self-pay

## 2021-02-27 ENCOUNTER — Ambulatory Visit (INDEPENDENT_AMBULATORY_CARE_PROVIDER_SITE_OTHER): Payer: Medicare Other

## 2021-02-27 VITALS — BP 116/70 | HR 65 | Temp 98.3°F | Ht 63.0 in | Wt 86.2 lb

## 2021-02-27 DIAGNOSIS — Z0000 Encounter for general adult medical examination without abnormal findings: Secondary | ICD-10-CM

## 2021-02-27 NOTE — Progress Notes (Addendum)
Subjective:   Julie Montgomery is a 85 y.o. female who presents for Medicare Annual (Subsequent) preventive examination.  Review of Systems     Cardiac Risk Factors include: advanced age (>53men, >3 women)     Objective:    Today's Vitals   02/27/21 0832 02/27/21 0902  BP:  116/70  Pulse:  65  Temp:  98.3 F (36.8 C)  SpO2:  96%  Weight:  86 lb 3.2 oz (39.1 kg)  Height: 5\' 3"  (1.6 m) 5\' 3"  (1.6 m)  PainSc:  0-No pain   Body mass index is 15.27 kg/m.  Advanced Directives 02/27/2021 02/01/2021 12/15/2019 07/02/2019 03/05/2019 06/25/2018 01/24/2017  Does Patient Have a Medical Advance Directive? No No Yes No Yes No No  Type of Advance Directive - - Living will - [place] - -  Does patient want to make changes to medical advance directive? - - No - Patient declined - No - Patient declined - -  Copy of [place] in Chart? - - - - No - copy requested - -  Would patient like information on creating a medical advance directive? No - Patient declined No - Patient declined - No - Patient declined - Yes (ED - Information included in AVS) No - Patient declined    Current Medications (verified) Outpatient Encounter Medications as of 02/27/2021  Medication Sig   Ascorbic Acid (VITAMIN C PO) Take by mouth. (Patient not taking: Reported on 02/27/2021)   Cholecalciferol (VITAMIN D3) 1000 UNITS CAPS Take 1 capsule by mouth daily. Take total of 2 daily (Patient not taking: Reported on 02/27/2021)   megestrol (MEGACE) 40 MG tablet Take 1 tablet (40 mg total) by mouth daily. (Patient not taking: Reported on 02/27/2021)   meloxicam (MOBIC) 7.5 MG tablet Take 1 tablet (7.5 mg total) by mouth daily. With food (Patient not taking: Reported on 02/27/2021)   No facility-administered encounter medications on file as of 02/27/2021.    Allergies (verified) Fluoxetine hcl, Lovastatin, Mometasone furo-formoterol fum, and Sulfa antibiotics   History: Past Medical  History:  Diagnosis Date   Adenomatous polyp of colon 2020   Allergy    Anxiety    Basal cell carcinoma (BCC)    Cataract    Depression    Helicobacter pylori gastritis    Osteopenia    Pneumonia    hx of 03/2008   Stroke [facility])    TIA long time ago   Past Surgical History:  Procedure Laterality Date   APPENDECTOMY     BREAST LUMPECTOMY     right   CATARACT EXTRACTION W/ INTRAOCULAR LENS  IMPLANT, BILATERAL Bilateral    CESAREAN SECTION     CHOLECYSTECTOMY     MOHS SURGERY     PARTIAL HYSTERECTOMY     ROTATOR CUFF REPAIR  2012   SKIN GRAFT     teeth remmoved     Family History  Problem Relation Age of Onset   Diabetes Sister    Cancer Sister        lung ca   Coronary artery disease Brother    Dementia Brother    Cancer Brother        lung ca   Stomach cancer Paternal Grandmother    Colon cancer Son    Breast cancer Neg Hx    Esophageal cancer Neg Hx    Rectal cancer Neg Hx    Social History   Socioeconomic History   Marital status: Married  Spouse name: Not on file   Number of children: 4   Years of education: Not on file   Highest education level: Not on file  Occupational History   Occupation: Oceanographer  Tobacco Use   Smoking status: Never   Smokeless tobacco: Never  Vaping Use   Vaping Use: Never used  Substance and Sexual Activity   Alcohol use: No   Drug use: No   Sexual activity: Not Currently  Other Topics Concern   Not on file  Social History Narrative   Married, Retired, she was looking after her GGD, now in court battle   Social Determinants of Radio broadcast assistant Strain: Low Risk    Difficulty of Paying Living Expenses: Not hard at all  Food Insecurity: No Food Insecurity   Worried About Charity fundraiser in the Last Year: Never true   Arboriculturist in the Last Year: Never true  Transportation Needs: No Transportation Needs   Lack of Transportation (Medical): No   Lack of Transportation (Non-Medical): No   Physical Activity: Inactive   Days of Exercise per Week: 0 days   Minutes of Exercise per Session: 0 min  Stress: Stress Concern Present   Feeling of Stress : Very much  Social Connections: Socially Integrated   Frequency of Communication with Friends and Family: More than three times a week   Frequency of Social Gatherings with Friends and Family: More than three times a week   Attends Religious Services: More than 4 times per year   Active Member of [employer] or Organizations: Yes   Attends Music therapist: More than 4 times per year   Marital Status: Married    Tobacco Counseling Counseling given: Not Answered   Clinical Intake:  Pre-visit preparation completed: Yes  Pain : No/denies pain Pain Score: 0-No pain     BMI - recorded: 15.27 Nutritional Status: BMI <19  Underweight Nutritional Risks: Unintentional weight loss Diabetes: No  How often do you need to have someone help you when you read instructions, pamphlets, or other written materials from your doctor or pharmacy?: 1 - Never What is the last grade level you completed in school?: High School Graduate  Diabetic? no  Interpreter Needed?: No  Information entered by :: Lisette Abu, LPN   Activities of Daily Living In your present state of health, do you have any difficulty performing the following activities: 02/27/2021  Hearing? N  Vision? N  Difficulty concentrating or making decisions? Y  Walking or climbing stairs? N  Dressing or bathing? N  Doing errands, shopping? N  Preparing Food and eating ? N  Using the Toilet? N  In the past six months, have you accidently leaked urine? Y  Do you have problems with loss of bowel control? N  Managing your Medications? N  Managing your Finances? N  Housekeeping or managing your Housekeeping? N  Some recent data might be hidden    Patient Care Team: Plotnikov, Evie Lacks, MD as PCP - General Ladene Artist, MD as Consulting Physician  (Gastroenterology)  Indicate any recent Medical Services you may have received from other than [facility] providers in the past year (date may be approximate).     Assessment:   This is a routine wellness examination for Julie Montgomery.  Hearing/Vision screen No results found.  Dietary issues and exercise activities discussed: Current Exercise Habits: Home exercise routine, Type of exercise: walking, Time (Minutes): 30, Frequency (Times/Week): 5, Weekly Exercise (Minutes/Week): 150,  Intensity: Mild, Exercise limited by: psychological condition(s)   Goals Addressed               This Visit's Progress     Patient Stated (pt-stated)        My goal is to gain weight.      Depression Screen PHQ 2/9 Scores 02/27/2021 12/15/2019 06/25/2018 03/20/2018 01/24/2017 10/30/2016 06/14/2015  PHQ - 2 Score 4 0 2 0 3 1 2   PHQ- 9 Score - - 4 0 4 - 7    Fall Risk Fall Risk  02/27/2021 12/15/2019 06/25/2018 03/20/2018 03/17/2018  Falls in the past year? 0 0 0 0 0  Comment - - - - Emmi Telephone Survey: data to providers prior to load  Number falls in past yr: 0 0 0 - -  Injury with Fall? 0 0 - - -  Risk for fall due to : No Fall Risks No Fall Risks History of fall(s) - -  Follow up Falls evaluation completed Falls evaluation completed Falls prevention discussed Falls evaluation completed -    FALL RISK PREVENTION PERTAINING TO THE HOME:  Any stairs in or around the home? Yes  If so, are there any without handrails? No  Home free of loose throw rugs in walkways, pet beds, electrical cords, etc? Yes  Adequate lighting in your home to reduce risk of falls? Yes   ASSISTIVE DEVICES UTILIZED TO PREVENT FALLS:  Life alert? No  Use of a cane, walker or w/c? No  Grab bars in the bathroom? No  Shower chair or bench in shower? Yes  Elevated toilet seat or a handicapped toilet? Yes   TIMED UP AND GO:  Was the test performed? Yes .  Length of time to ambulate 10 feet: 7 sec.   Gait steady and fast without use  of assistive device  Cognitive Function: Patient has current diagnosis of cognitive impairment.   MMSE - Mini Mental State Exam 01/24/2017 06/14/2015  Not completed: - (No Data)  Orientation to time 5 -  Orientation to Place 5 -  Registration 3 -  Attention/ Calculation 5 -  Recall 3 -  Language- name 2 objects 2 -  Language- repeat 1 -  Language- follow 3 step command 3 -  Language- read & follow direction 1 -  Write a sentence 1 -  Copy design 1 -  Total score 30 -     6CIT Screen 12/15/2019  What Year? 0 points  What month? 0 points  What time? 0 points  Count back from 20 0 points  Months in reverse 0 points  Repeat phrase 0 points  Total Score 0    Immunizations Immunization History  Administered Date(s) Administered   Fluad Quad(high Dose 65+) 01/19/2019, 01/19/2020, 02/14/2021   Hepatitis A 06/14/2005   Hepatitis B 06/14/2005   Influenza Split 01/22/2011, 01/23/2012   Influenza Whole 01/29/2008, 02/17/2008, 02/08/2009   Influenza, High Dose Seasonal PF 02/17/2013, 02/17/2016, 01/24/2017, 01/29/2018   Influenza,inj,Quad PF,6+ Mos 01/20/2014, 12/30/2014   PFIZER(Purple Top)SARS-COV-2 Vaccination 06/06/2019, 07/01/2019, 02/13/2020   PPD Test 05/25/2011   Pneumococcal Conjugate-13 05/12/2013   Pneumococcal Polysaccharide-23 02/02/2005, 05/25/2011   Td 06/14/2005   Tdap 10/18/2015   Zoster, Live 08/18/2007    TDAP status: Up to date  Flu Vaccine status: Up to date  Pneumococcal vaccine status: Up to date  Covid-19 vaccine status: Completed vaccines  Qualifies for Shingles Vaccine? Yes   Zostavax completed Yes   Shingrix Completed?: No.    Education  has been provided regarding the importance of this vaccine. Patient has been advised to call insurance company to determine out of pocket expense if they have not yet received this vaccine. Advised may also receive vaccine at local pharmacy or Health Dept. Verbalized acceptance and understanding.  Screening  Tests Health Maintenance  Topic Date Due   Zoster Vaccines- Shingrix (1 of 2) Never done   COVID-19 Vaccine (4 - Booster for Pfizer series) 04/09/2020   TETANUS/TDAP  10/17/2025   Pneumonia Vaccine 20+ Years old  Completed   INFLUENZA VACCINE  Completed   DEXA SCAN  Completed   HPV VACCINES  Aged Out    Health Maintenance  Health Maintenance Due  Topic Date Due   Zoster Vaccines- Shingrix (1 of 2) Never done   COVID-19 Vaccine (4 - Booster for Pfizer series) 04/09/2020    Colorectal cancer screening: No longer required.   Mammogram status: Completed 03/31/2020. Repeat every year  Bone density status: no longer recommended  Lung Cancer Screening: (Low Dose CT Chest recommended if Age 53-80 years, 30 pack-year currently smoking OR have quit w/in 15years.) does not qualify.   Lung Cancer Screening Referral: no  Additional Screening:  Hepatitis C Screening: does not qualify; Completed no  Vision Screening: Recommended annual ophthalmology exams for early detection of glaucoma and other disorders of the eye. Is the patient up to date with their annual eye exam?  Yes  Who is the provider or what is the name of the office in which the patient attends annual eye exams? [facility] If pt is not established with a provider, would they like to be referred to a provider to establish care? No .   Dental Screening: Recommended annual dental exams for proper oral hygiene  Community Resource Referral / Chronic Care Management: CRR required this visit?  No   CCM required this visit?  No      Plan:     I have personally reviewed and noted the following in the patient's chart:   Medical and social history Use of alcohol, tobacco or illicit drugs  Current medications and supplements including opioid prescriptions.  Functional ability and status Nutritional status Physical activity Advanced directives List of other physicians Hospitalizations, surgeries, and ER  visits in previous 12 months Vitals Screenings to include cognitive, depression, and falls Referrals and appointments  In addition, I have reviewed and discussed with patient certain preventive protocols, quality metrics, and best practice recommendations. A written personalized care plan for preventive services as well as general preventive health recommendations were provided to patient.     Sheral Flow, LPN   [id number]   Nurse Notes:  Patient denied any hearing difficulty.   No hearing aids. Patient wears corrective glasses/contacts.   Eye exam done annually by: [facility].  Medical screening examination/treatment/procedure(s) were performed by non-physician practitioner and as supervising physician I was immediately available for consultation/collaboration.  I agree with above. Lew Dawes, MD

## 2021-02-27 NOTE — Patient Instructions (Signed)
Julie Montgomery , Thank you for taking time to come for your Medicare Wellness Visit. I appreciate your ongoing commitment to your health goals. Please review the following plan we discussed and let me know if I can assist you in the future.   Screening recommendations/referrals: Colonoscopy: Not a candidate due to age Mammogram: 03/31/2020; due every year Bone Density: No longer recommended Recommended yearly ophthalmology/optometry visit for glaucoma screening and checkup Recommended yearly dental visit for hygiene and checkup  Vaccinations: Influenza vaccine: 02/14/2021 Pneumococcal vaccine: 05/25/2011, 05/12/2013 Tdap vaccine: 10/18/2015; due every 10 years Shingles vaccine: never done   Covid-19: 06/06/2019, 07/01/2019, 02/13/2020  Advanced directives: Please bring a copy of your health care power of attorney and living will to the office at your convenience.  Conditions/risks identified: Yes; Client understands the importance of follow-up with providers by attending scheduled visits and discussed goals to eat healthier, increase physical activity, exercise the brain, socialize more, get enough sleep and make time for laughter.  Next appointment: Please schedule your next Medicare Wellness Visit with your Nurse Health Advisor in 1 year by calling [phone].   Preventive Care 24 Years and Older, Female Preventive care refers to lifestyle choices and visits with your health care provider that can promote health and wellness. What does preventive care include? A yearly physical exam. This is also called an annual well check. Dental exams once or twice a year. Routine eye exams. Ask your health care provider how often you should have your eyes checked. Personal lifestyle choices, including: Daily care of your teeth and gums. Regular physical activity. Eating a healthy diet. Avoiding tobacco and drug use. Limiting alcohol use. Practicing safe sex. Taking low-dose aspirin every  day. Taking vitamin and mineral supplements as recommended by your health care provider. What happens during an annual well check? The services and screenings done by your health care provider during your annual well check will depend on your age, overall health, lifestyle risk factors, and family history of disease. Counseling  Your health care provider may ask you questions about your: Alcohol use. Tobacco use. Drug use. Emotional well-being. Home and relationship well-being. Sexual activity. Eating habits. History of falls. Memory and ability to understand (cognition). Work and work Statistician. Reproductive health. Screening  You may have the following tests or measurements: Height, weight, and BMI. Blood pressure. Lipid and cholesterol levels. These may be checked every 5 years, or more frequently if you are over 6 years old. Skin check. Lung cancer screening. You may have this screening every year starting at age 66 if you have a 30-pack-year history of smoking and currently smoke or have quit within the past 15 years. Fecal occult blood test (FOBT) of the stool. You may have this test every year starting at age 79. Flexible sigmoidoscopy or colonoscopy. You may have a sigmoidoscopy every 5 years or a colonoscopy every 10 years starting at age 82. Hepatitis C blood test. Hepatitis B blood test. Sexually transmitted disease (STD) testing. Diabetes screening. This is done by checking your blood sugar (glucose) after you have not eaten for a while (fasting). You may have this done every 1-3 years. Bone density scan. This is done to screen for osteoporosis. You may have this done starting at age 40. Mammogram. This may be done every 1-2 years. Talk to your health care provider about how often you should have regular mammograms. Talk with your health care provider about your test results, treatment options, and if necessary, the need for more tests. Vaccines  Your health care  provider may recommend certain vaccines, such as: Influenza vaccine. This is recommended every year. Tetanus, diphtheria, and acellular pertussis (Tdap, Td) vaccine. You may need a Td booster every 10 years. Zoster vaccine. You may need this after age 17. Pneumococcal 13-valent conjugate (PCV13) vaccine. One dose is recommended after age 12. Pneumococcal polysaccharide (PPSV23) vaccine. One dose is recommended after age 43. Talk to your health care provider about which screenings and vaccines you need and how often you need them. This information is not intended to replace advice given to you by your health care provider. Make sure you discuss any questions you have with your health care provider. Document Released: 05/13/2015 Document Revised: 01/04/2016 Document Reviewed: 02/15/2015 Elsevier Interactive Patient Education  2017 [place] Prevention in the Home Falls can cause injuries. They can happen to people of all ages. There are many things you can do to make your home safe and to help prevent falls. What can I do on the outside of my home? Regularly fix the edges of walkways and driveways and fix any cracks. Remove anything that might make you trip as you walk through a door, such as a raised step or threshold. Trim any bushes or trees on the path to your home. Use bright outdoor lighting. Clear any walking paths of anything that might make someone trip, such as rocks or tools. Regularly check to see if handrails are loose or broken. Make sure that both sides of any steps have handrails. Any raised decks and porches should have guardrails on the edges. Have any leaves, snow, or ice cleared regularly. Use sand or salt on walking paths during winter. Clean up any spills in your garage right away. This includes oil or grease spills. What can I do in the bathroom? Use night lights. Install grab bars by the toilet and in the tub and shower. Do not use towel bars as grab  bars. Use non-skid mats or decals in the tub or shower. If you need to sit down in the shower, use a plastic, non-slip stool. Keep the floor dry. Clean up any water that spills on the floor as soon as it happens. Remove soap buildup in the tub or shower regularly. Attach bath mats securely with double-sided non-slip rug tape. Do not have throw rugs and other things on the floor that can make you trip. What can I do in the bedroom? Use night lights. Make sure that you have a light by your bed that is easy to reach. Do not use any sheets or blankets that are too big for your bed. They should not hang down onto the floor. Have a firm chair that has side arms. You can use this for support while you get dressed. Do not have throw rugs and other things on the floor that can make you trip. What can I do in the kitchen? Clean up any spills right away. Avoid walking on wet floors. Keep items that you use a lot in easy-to-reach places. If you need to reach something above you, use a strong step stool that has a grab bar. Keep electrical cords out of the way. Do not use floor polish or wax that makes floors slippery. If you must use wax, use non-skid floor wax. Do not have throw rugs and other things on the floor that can make you trip. What can I do with my stairs? Do not leave any items on the stairs. Make sure that there are  handrails on both sides of the stairs and use them. Fix handrails that are broken or loose. Make sure that handrails are as long as the stairways. Check any carpeting to make sure that it is firmly attached to the stairs. Fix any carpet that is loose or worn. Avoid having throw rugs at the top or bottom of the stairs. If you do have throw rugs, attach them to the floor with carpet tape. Make sure that you have a light switch at the top of the stairs and the bottom of the stairs. If you do not have them, ask someone to add them for you. What else can I do to help prevent  falls? Wear shoes that: Do not have high heels. Have rubber bottoms. Are comfortable and fit you well. Are closed at the toe. Do not wear sandals. If you use a stepladder: Make sure that it is fully opened. Do not climb a closed stepladder. Make sure that both sides of the stepladder are locked into place. Ask someone to hold it for you, if possible. Clearly mark and make sure that you can see: Any grab bars or handrails. First and last steps. Where the edge of each step is. Use tools that help you move around (mobility aids) if they are needed. These include: Canes. Walkers. Scooters. Crutches. Turn on the lights when you go into a dark area. Replace any light bulbs as soon as they burn out. Set up your furniture so you have a clear path. Avoid moving your furniture around. If any of your floors are uneven, fix them. If there are any pets around you, be aware of where they are. Review your medicines with your doctor. Some medicines can make you feel dizzy. This can increase your chance of falling. Ask your doctor what other things that you can do to help prevent falls. This information is not intended to replace advice given to you by your health care provider. Make sure you discuss any questions you have with your health care provider. Document Released: 02/10/2009 Document Revised: 09/22/2015 Document Reviewed: 05/21/2014 Elsevier Interactive Patient Education  2017 [employer].

## 2021-03-09 ENCOUNTER — Telehealth: Payer: Self-pay | Admitting: Internal Medicine

## 2021-03-09 ENCOUNTER — Ambulatory Visit (INDEPENDENT_AMBULATORY_CARE_PROVIDER_SITE_OTHER): Payer: Medicare Other | Admitting: Internal Medicine

## 2021-03-09 ENCOUNTER — Other Ambulatory Visit: Payer: Self-pay

## 2021-03-09 ENCOUNTER — Encounter: Payer: Self-pay | Admitting: Internal Medicine

## 2021-03-09 DIAGNOSIS — F4323 Adjustment disorder with mixed anxiety and depressed mood: Secondary | ICD-10-CM

## 2021-03-09 DIAGNOSIS — R413 Other amnesia: Secondary | ICD-10-CM

## 2021-03-09 DIAGNOSIS — T466X5A Adverse effect of antihyperlipidemic and antiarteriosclerotic drugs, initial encounter: Secondary | ICD-10-CM | POA: Diagnosis not present

## 2021-03-09 DIAGNOSIS — I2510 Atherosclerotic heart disease of native coronary artery without angina pectoris: Secondary | ICD-10-CM

## 2021-03-09 DIAGNOSIS — R634 Abnormal weight loss: Secondary | ICD-10-CM | POA: Diagnosis not present

## 2021-03-09 DIAGNOSIS — G720 Drug-induced myopathy: Secondary | ICD-10-CM | POA: Diagnosis not present

## 2021-03-09 MED ORDER — DONEPEZIL HCL 5 MG PO TABS: 5.0000 mg | ORAL_TABLET | Freq: Every day | ORAL | 3 refills | Status: DC

## 2021-03-09 NOTE — Assessment & Plan Note (Signed)
Pt declined statins 

## 2021-03-09 NOTE — Progress Notes (Signed)
Subjective:  Patient ID: Julie Montgomery, female    DOB: 25-Apr-1936  Age: 85 y.o. MRN: [id number]  CC: Annual Exam (Discuss getting shingles)   HPI Julie Montgomery presents for wt loss, memory issues, anxiety Pt has not picked up Megace yet.  Outpatient Medications Prior to Visit  Medication Sig Dispense Refill   Ascorbic Acid (VITAMIN C PO) Take by mouth.     Cholecalciferol (VITAMIN D3) 1000 UNITS CAPS Take 1 capsule by mouth daily. Take total of 2 daily     megestrol (MEGACE) 40 MG tablet Take 1 tablet (40 mg total) by mouth daily. 30 tablet 3   meloxicam (MOBIC) 7.5 MG tablet Take 1 tablet (7.5 mg total) by mouth daily. With food (Patient not taking: No sig reported) 30 tablet 0   No facility-administered medications prior to visit.    ROS: Review of Systems  Constitutional:  Positive for fatigue and unexpected weight change. Negative for activity change, appetite change and chills.  HENT:  Negative for congestion, mouth sores and sinus pressure.   Eyes:  Negative for visual disturbance.  Respiratory:  Negative for cough and chest tightness.   Gastrointestinal:  Negative for abdominal pain and nausea.  Genitourinary:  Negative for difficulty urinating, frequency and vaginal pain.  Musculoskeletal:  Negative for back pain and gait problem.  Skin:  Negative for pallor and rash.  Neurological:  Negative for dizziness, tremors, weakness, numbness and headaches.  Psychiatric/Behavioral:  Positive for decreased concentration. Negative for confusion, sleep disturbance and suicidal ideas. The patient is nervous/anxious.    Objective:  BP 122/72 (BP Location: Left Arm)   Pulse 68   Temp 97.8 F (36.6 C) (Oral)   Ht 5\' 3"  (1.6 m)   Wt 88 lb 9.6 oz (40.2 kg)   SpO2 96%   BMI 15.69 kg/m   BP Readings from Last 3 Encounters:  03/09/21 122/72  02/27/21 116/70  02/14/21 132/74    Wt Readings from Last 3 Encounters:  03/09/21 88 lb 9.6 oz (40.2 kg)  02/27/21 86 lb 3.2 oz  (39.1 kg)  02/14/21 87 lb 3.2 oz (39.6 kg)    Physical Exam Constitutional:      General: She is not in acute distress.    Appearance: She is well-developed.  HENT:     Head: Normocephalic.     Right Ear: External ear normal.     Left Ear: External ear normal.     Nose: Nose normal.  Eyes:     General:        Right eye: No discharge.        Left eye: No discharge.     Conjunctiva/sclera: Conjunctivae normal.     Pupils: Pupils are equal, round, and reactive to light.  Neck:     Thyroid: No thyromegaly.     Vascular: No JVD.     Trachea: No tracheal deviation.  Cardiovascular:     Rate and Rhythm: Normal rate and regular rhythm.     Heart sounds: Normal heart sounds.  Pulmonary:     Effort: No respiratory distress.     Breath sounds: No stridor. No wheezing.  Abdominal:     General: Bowel sounds are normal. There is no distension.     Palpations: Abdomen is soft. There is no mass.     Tenderness: There is no abdominal tenderness. There is no guarding or rebound.  Musculoskeletal:        General: No tenderness.     Cervical  back: Normal range of motion and neck supple. No rigidity.  Lymphadenopathy:     Cervical: No cervical adenopathy.  Skin:    Findings: No erythema or rash.  Neurological:     Cranial Nerves: No cranial nerve deficit.     Motor: No abnormal muscle tone.     Coordination: Coordination normal.     Deep Tendon Reflexes: Reflexes normal.  Psychiatric:        Behavior: Behavior normal.        Thought Content: Thought content normal.        Judgment: Judgment normal.  Thin Dentures - ?ill fitted   Lab Results  Component Value Date   WBC 7.2 02/02/2021   HGB 12.8 02/02/2021   HCT 38.6 02/02/2021   PLT 193 02/02/2021   GLUCOSE 103 (H) 02/02/2021   CHOL 203 (H) 01/18/2020   TRIG 104.0 01/18/2020   HDL 69.70 01/18/2020   LDLDIRECT 145.7 05/12/2013   LDLCALC 113 (H) 01/18/2020   ALT 18 02/02/2021   AST 21 02/02/2021   NA 136 02/02/2021   K 4.2  02/02/2021   CL 104 02/02/2021   CREATININE 0.55 02/02/2021   BUN 23 02/02/2021   CO2 28 02/02/2021   TSH 3.00 01/18/2020    DG Ankle Complete Right  Result Date: 02/01/2021 CLINICAL DATA:  Developing cellulitis, initial encounter EXAM: RIGHT ANKLE - COMPLETE 3+ VIEW COMPARISON:  None. FINDINGS: No acute fracture or dislocation is noted. Calcaneal spurring is noted. No soft tissue swelling is seen. IMPRESSION: No acute abnormality noted. Electronically Signed   By: Inez Catalina M.D.   On: 02/01/2021 22:26    Assessment & Plan:   Problem List Items Addressed This Visit     Coronary atherosclerosis    The patient declined statins      Memory problem    Pt reports some memory worsening. Discussed. Pt was suggested to take Aricept - Rx given Improve nutrition      Situational mixed anxiety and depressive disorder    Discussed.  Mild dementia may be contributing. Stable anxiety symptoms      Statin myopathy    Pt declined statins      Weight loss    Pt has not picked up Megace yet. - Asked to pick up Megace Dentures - ?ill fitted due to wt loss         Meds ordered this encounter  Medications   donepezil (ARICEPT) 5 MG tablet    Sig: Take 1 tablet (5 mg total) by mouth at bedtime.    Dispense:  90 tablet    Refill:  3      Follow-up: Return in about 2 months (around 05/09/2021) for a follow-up visit.  Walker Kehr, MD

## 2021-03-09 NOTE — Assessment & Plan Note (Addendum)
Discussed.  Mild dementia may be contributing. Stable anxiety symptoms

## 2021-03-09 NOTE — Assessment & Plan Note (Addendum)
Pt reports some memory worsening. Discussed. Pt was suggested to take Aricept - Rx given Improve nutrition

## 2021-03-09 NOTE — Assessment & Plan Note (Addendum)
Pt has not picked up Megace yet. - Asked to pick up Megace Dentures - ?ill fitted due to wt loss

## 2021-03-09 NOTE — Telephone Encounter (Signed)
Patient states she did not receive labs w/ her physical today  Patient requesting a call back to discuss why she did not have labs done

## 2021-03-12 NOTE — Assessment & Plan Note (Signed)
The patient declined statins

## 2021-03-14 DIAGNOSIS — H43813 Vitreous degeneration, bilateral: Secondary | ICD-10-CM | POA: Diagnosis not present

## 2021-03-28 DIAGNOSIS — L820 Inflamed seborrheic keratosis: Secondary | ICD-10-CM | POA: Diagnosis not present

## 2021-03-28 DIAGNOSIS — C44311 Basal cell carcinoma of skin of nose: Secondary | ICD-10-CM | POA: Diagnosis not present

## 2021-03-28 DIAGNOSIS — D692 Other nonthrombocytopenic purpura: Secondary | ICD-10-CM | POA: Diagnosis not present

## 2021-03-28 DIAGNOSIS — L570 Actinic keratosis: Secondary | ICD-10-CM | POA: Diagnosis not present

## 2021-03-28 DIAGNOSIS — L814 Other melanin hyperpigmentation: Secondary | ICD-10-CM | POA: Diagnosis not present

## 2021-03-28 DIAGNOSIS — L821 Other seborrheic keratosis: Secondary | ICD-10-CM | POA: Diagnosis not present

## 2021-03-28 DIAGNOSIS — Z85828 Personal history of other malignant neoplasm of skin: Secondary | ICD-10-CM | POA: Diagnosis not present

## 2021-03-28 DIAGNOSIS — D1801 Hemangioma of skin and subcutaneous tissue: Secondary | ICD-10-CM | POA: Diagnosis not present

## 2021-03-28 DIAGNOSIS — D485 Neoplasm of uncertain behavior of skin: Secondary | ICD-10-CM | POA: Diagnosis not present

## 2021-03-31 ENCOUNTER — Ambulatory Visit (INDEPENDENT_AMBULATORY_CARE_PROVIDER_SITE_OTHER): Payer: Medicare Other | Admitting: Internal Medicine

## 2021-03-31 ENCOUNTER — Other Ambulatory Visit: Payer: Self-pay

## 2021-03-31 ENCOUNTER — Encounter: Payer: Self-pay | Admitting: Internal Medicine

## 2021-03-31 DIAGNOSIS — F4323 Adjustment disorder with mixed anxiety and depressed mood: Secondary | ICD-10-CM | POA: Diagnosis not present

## 2021-03-31 DIAGNOSIS — R634 Abnormal weight loss: Secondary | ICD-10-CM

## 2021-03-31 DIAGNOSIS — R413 Other amnesia: Secondary | ICD-10-CM

## 2021-03-31 DIAGNOSIS — F411 Generalized anxiety disorder: Secondary | ICD-10-CM | POA: Diagnosis not present

## 2021-03-31 DIAGNOSIS — I2510 Atherosclerotic heart disease of native coronary artery without angina pectoris: Secondary | ICD-10-CM | POA: Diagnosis not present

## 2021-03-31 DIAGNOSIS — R636 Underweight: Secondary | ICD-10-CM | POA: Diagnosis not present

## 2021-03-31 MED ORDER — MEGESTROL ACETATE 40 MG PO TABS
40.0000 mg | ORAL_TABLET | Freq: Every day | ORAL | 3 refills | Status: DC
Start: 2021-03-31 — End: 2021-11-14

## 2021-03-31 MED ORDER — DONEPEZIL HCL 5 MG PO TABS
5.0000 mg | ORAL_TABLET | Freq: Every day | ORAL | 3 refills | Status: DC
Start: 2021-03-31 — End: 2021-08-19

## 2021-03-31 NOTE — Assessment & Plan Note (Signed)
Cont on Megace

## 2021-03-31 NOTE — Assessment & Plan Note (Signed)
Take Valerian root capsules or tincture twice a day

## 2021-03-31 NOTE — Assessment & Plan Note (Addendum)
Start  Aricept (Donepezil)

## 2021-03-31 NOTE — Patient Instructions (Addendum)
Take Valerian root capsules or tincture twice a day   Start  Aricept (Donepezil)

## 2021-03-31 NOTE — Progress Notes (Signed)
Subjective:  Patient ID: Julie Montgomery, female    DOB: 01-Aug-1935  Age: 85 y.o. MRN: [id number]  CC: Follow-up (4 week f/u)   HPI [name] presents for wt loss, memory issues, anxiety. Minela started Megace - good appetite, no wt gain however...   Outpatient Medications Prior to Visit  Medication Sig Dispense Refill   Ascorbic Acid (VITAMIN C PO) Take by mouth.     Cholecalciferol (VITAMIN D3) 1000 UNITS CAPS Take 1 capsule by mouth daily. Take total of 2 daily     donepezil (ARICEPT) 5 MG tablet Take 1 tablet (5 mg total) by mouth at bedtime. 90 tablet 3   megestrol (MEGACE) 40 MG tablet Take 1 tablet (40 mg total) by mouth daily. 30 tablet 3   No facility-administered medications prior to visit.    ROS: Review of Systems  Constitutional:  Negative for activity change, appetite change, chills, fatigue and unexpected weight change.  HENT:  Negative for congestion, mouth sores and sinus pressure.   Eyes:  Negative for visual disturbance.  Respiratory:  Negative for cough and chest tightness.   Gastrointestinal:  Negative for abdominal pain and nausea.  Genitourinary:  Negative for difficulty urinating, frequency and vaginal pain.  Musculoskeletal:  Negative for back pain and gait problem.  Skin:  Negative for pallor and rash.  Neurological:  Negative for dizziness, tremors, weakness, numbness and headaches.  Psychiatric/Behavioral:  Negative for confusion, sleep disturbance and suicidal ideas.    Objective:  BP 134/68 (BP Location: Left Arm)   Pulse 80   Temp 98.7 F (37.1 C) (Oral)   Ht 5\' 3"  (1.6 m)   Wt 88 lb (39.9 kg)   SpO2 93%   BMI 15.59 kg/m   BP Readings from Last 3 Encounters:  03/31/21 134/68  03/09/21 122/72  02/27/21 116/70    Wt Readings from Last 3 Encounters:  03/31/21 88 lb (39.9 kg)  03/09/21 88 lb 9.6 oz (40.2 kg)  02/27/21 86 lb 3.2 oz (39.1 kg)    Physical Exam Constitutional:      General: She is not in acute distress.     Appearance: She is well-developed.  HENT:     Head: Normocephalic.     Right Ear: External ear normal.     Left Ear: External ear normal.     Nose: Nose normal.  Eyes:     General:        Right eye: No discharge.        Left eye: No discharge.     Conjunctiva/sclera: Conjunctivae normal.     Pupils: Pupils are equal, round, and reactive to light.  Neck:     Thyroid: No thyromegaly.     Vascular: No JVD.     Trachea: No tracheal deviation.  Cardiovascular:     Rate and Rhythm: Normal rate and regular rhythm.     Heart sounds: Normal heart sounds.  Pulmonary:     Effort: No respiratory distress.     Breath sounds: No stridor. No wheezing.  Abdominal:     General: Bowel sounds are normal. There is no distension.     Palpations: Abdomen is soft. There is no mass.     Tenderness: no abdominal tenderness There is no guarding or rebound.  Musculoskeletal:        General: No tenderness.     Cervical back: Normal range of motion and neck supple. No rigidity.  Lymphadenopathy:     Cervical: No cervical adenopathy.  Skin:    Findings: No erythema or rash.  Neurological:     Cranial Nerves: No cranial nerve deficit.     Motor: No abnormal muscle tone.     Coordination: Coordination normal.     Deep Tendon Reflexes: Reflexes normal.  Psychiatric:        Behavior: Behavior normal.        Thought Content: Thought content normal.        Judgment: Judgment normal.    Lab Results  Component Value Date   WBC 7.2 02/02/2021   HGB 12.8 02/02/2021   HCT 38.6 02/02/2021   PLT 193 02/02/2021   GLUCOSE 103 (H) 02/02/2021   CHOL 203 (H) 01/18/2020   TRIG 104.0 01/18/2020   HDL 69.70 01/18/2020   LDLDIRECT 145.7 05/12/2013   LDLCALC 113 (H) 01/18/2020   ALT 18 02/02/2021   AST 21 02/02/2021   NA 136 02/02/2021   K 4.2 02/02/2021   CL 104 02/02/2021   CREATININE 0.55 02/02/2021   BUN 23 02/02/2021   CO2 28 02/02/2021   TSH 3.00 01/18/2020    DG Ankle Complete Right  Result  Date: 02/01/2021 CLINICAL DATA:  Developing cellulitis, initial encounter EXAM: RIGHT ANKLE - COMPLETE 3+ VIEW COMPARISON:  None. FINDINGS: No acute fracture or dislocation is noted. Calcaneal spurring is noted. No soft tissue swelling is seen. IMPRESSION: No acute abnormality noted. Electronically Signed   By: Inez Catalina M.D.   On: 02/01/2021 22:26    Assessment & Plan:   Problem List Items Addressed This Visit     Anxiety disorder    Take Valerian root capsules or tincture twice a day      Memory problem    Start  Aricept (Donepezil)      Situational mixed anxiety and depressive disorder    Take Valerian root capsules or tincture twice a day      Weight loss    Shelbia started Megace - good appetite, no wt gain however... Try to fix dentures         Meds ordered this encounter  Medications   donepezil (ARICEPT) 5 MG tablet    Sig: Take 1 tablet (5 mg total) by mouth at bedtime.    Dispense:  90 tablet    Refill:  3   megestrol (MEGACE) 40 MG tablet    Sig: Take 1 tablet (40 mg total) by mouth daily.    Dispense:  90 tablet    Refill:  3      Follow-up: Return in about 6 weeks (around 05/12/2021) for a follow-up visit.  Walker Kehr, MD

## 2021-03-31 NOTE — Assessment & Plan Note (Signed)
Julie Montgomery started Megace - good appetite, no wt gain however... Try to fix dentures

## 2021-04-01 ENCOUNTER — Encounter: Payer: Self-pay | Admitting: Internal Medicine

## 2021-04-03 ENCOUNTER — Ambulatory Visit
Admission: RE | Admit: 2021-04-03 | Discharge: 2021-04-03 | Disposition: A | Discharge status: Home / Self Care | Payer: Medicare Other | Source: Ambulatory Visit | Attending: Internal Medicine | Admitting: Internal Medicine

## 2021-04-03 DIAGNOSIS — Z1231 Encounter for screening mammogram for malignant neoplasm of breast: Secondary | ICD-10-CM | POA: Diagnosis not present

## 2021-04-05 ENCOUNTER — Other Ambulatory Visit (INDEPENDENT_AMBULATORY_CARE_PROVIDER_SITE_OTHER): Payer: Medicare Other

## 2021-04-05 DIAGNOSIS — I2510 Atherosclerotic heart disease of native coronary artery without angina pectoris: Secondary | ICD-10-CM | POA: Diagnosis not present

## 2021-04-05 DIAGNOSIS — E785 Hyperlipidemia, unspecified: Secondary | ICD-10-CM | POA: Diagnosis not present

## 2021-04-05 DIAGNOSIS — R634 Abnormal weight loss: Secondary | ICD-10-CM

## 2021-04-05 DIAGNOSIS — R42 Dizziness and giddiness: Secondary | ICD-10-CM | POA: Diagnosis not present

## 2021-04-05 LAB — URINALYSIS, ROUTINE W REFLEX MICROSCOPIC
Bilirubin Urine: NEGATIVE
Hgb urine dipstick: NEGATIVE
Ketones, ur: NEGATIVE
Nitrite: NEGATIVE
RBC / HPF: NONE SEEN (ref 0–?)
Specific Gravity, Urine: 1.015 (ref 1.000–1.030)
Total Protein, Urine: NEGATIVE
Urine Glucose: NEGATIVE
Urobilinogen, UA: 0.2 (ref 0.0–1.0)
pH: 6 (ref 5.0–8.0)

## 2021-04-05 LAB — COMPREHENSIVE METABOLIC PANEL
ALT: 16 U/L (ref 0–35)
AST: 20 U/L (ref 0–37)
Albumin: 4.1 g/dL (ref 3.5–5.2)
Alkaline Phosphatase: 86 U/L (ref 39–117)
BUN: 20 mg/dL (ref 6–23)
CO2: 29 mEq/L (ref 19–32)
Calcium: 9.4 mg/dL (ref 8.4–10.5)
Chloride: 102 mEq/L (ref 96–112)
Creatinine, Ser: 0.61 mg/dL (ref 0.40–1.20)
GFR: 82.88 mL/min (ref >60.00)
Glucose, Bld: 87 mg/dL (ref 70–99)
Potassium: 4.1 mEq/L (ref 3.5–5.1)
Sodium: 136 mEq/L (ref 135–145)
Total Bilirubin: 0.7 mg/dL (ref 0.2–1.2)
Total Protein: 6.5 g/dL (ref 6.0–8.3)

## 2021-04-05 LAB — LIPID PANEL
Cholesterol: 220 mg/dL — ABNORMAL HIGH (ref 0–200)
HDL: 79.5 mg/dL (ref >39.00)
LDL Cholesterol: 127 mg/dL — ABNORMAL HIGH (ref 0–99)
NonHDL: 140.23
Total CHOL/HDL Ratio: 3
Triglycerides: 65 mg/dL (ref 0.0–149.0)
VLDL: 13 mg/dL (ref 0.0–40.0)

## 2021-04-05 LAB — CBC WITH DIFFERENTIAL/PLATELET
Basophils Absolute: 0 10*3/uL (ref 0.0–0.1)
Basophils Relative: 0.7 % (ref 0.0–3.0)
Eosinophils Absolute: 0.1 10*3/uL (ref 0.0–0.7)
Eosinophils Relative: 1.3 % (ref 0.0–5.0)
HCT: 38.7 % (ref 36.0–46.0)
Hemoglobin: 12.7 g/dL (ref 12.0–15.0)
Lymphocytes Relative: 27.5 % (ref 12.0–46.0)
Lymphs Abs: 1.4 10*3/uL (ref 0.7–4.0)
MCHC: 32.7 g/dL (ref 30.0–36.0)
MCV: 96.2 fl (ref 78.0–100.0)
Monocytes Absolute: 0.4 10*3/uL (ref 0.1–1.0)
Monocytes Relative: 8.1 % (ref 3.0–12.0)
Neutro Abs: 3.2 10*3/uL (ref 1.4–7.7)
Neutrophils Relative %: 62.4 % (ref 43.0–77.0)
Platelets: 219 10*3/uL (ref 150.0–400.0)
RBC: 4.03 Mil/uL (ref 3.87–5.11)
RDW: 13.8 % (ref 11.5–15.5)
WBC: 5.2 10*3/uL (ref 4.0–10.5)

## 2021-04-05 LAB — TSH: TSH: 3.26 u[IU]/mL (ref 0.35–5.50)

## 2021-04-13 DIAGNOSIS — H43813 Vitreous degeneration, bilateral: Secondary | ICD-10-CM | POA: Diagnosis not present

## 2021-04-18 DIAGNOSIS — C44311 Basal cell carcinoma of skin of nose: Secondary | ICD-10-CM | POA: Diagnosis not present

## 2021-04-18 DIAGNOSIS — Z85828 Personal history of other malignant neoplasm of skin: Secondary | ICD-10-CM | POA: Diagnosis not present

## 2021-05-10 ENCOUNTER — Ambulatory Visit: Payer: Medicare Other | Admitting: Internal Medicine

## 2021-05-16 ENCOUNTER — Encounter: Payer: Self-pay | Admitting: Internal Medicine

## 2021-05-16 ENCOUNTER — Ambulatory Visit (INDEPENDENT_AMBULATORY_CARE_PROVIDER_SITE_OTHER): Payer: Medicare Other | Admitting: Internal Medicine

## 2021-05-16 ENCOUNTER — Other Ambulatory Visit: Payer: Self-pay

## 2021-05-16 DIAGNOSIS — R636 Underweight: Secondary | ICD-10-CM | POA: Diagnosis not present

## 2021-05-16 DIAGNOSIS — F411 Generalized anxiety disorder: Secondary | ICD-10-CM | POA: Diagnosis not present

## 2021-05-16 DIAGNOSIS — R634 Abnormal weight loss: Secondary | ICD-10-CM

## 2021-05-16 DIAGNOSIS — J479 Bronchiectasis, uncomplicated: Secondary | ICD-10-CM

## 2021-05-16 DIAGNOSIS — R413 Other amnesia: Secondary | ICD-10-CM | POA: Diagnosis not present

## 2021-05-16 DIAGNOSIS — F4323 Adjustment disorder with mixed anxiety and depressed mood: Secondary | ICD-10-CM

## 2021-05-16 NOTE — Assessment & Plan Note (Signed)
Start  Aricept (Donepezil)

## 2021-05-16 NOTE — Assessment & Plan Note (Signed)
Stable

## 2021-05-16 NOTE — Progress Notes (Signed)
Subjective:  Patient ID: Julie Montgomery, female    DOB: 26-Feb-1936  Age: 86 y.o. MRN: [id number]  CC: Follow-up (6 week f/u)   HPI [facility] ILLINGWORTH presents for wt loss, memory issues, ill-fitted dentures per pt. Odean not taking Donepezil, Megesterol.   Outpatient Medications Prior to Visit  Medication Sig Dispense Refill   Ascorbic Acid (VITAMIN C PO) Take by mouth.     Cholecalciferol (VITAMIN D3) 1000 UNITS CAPS Take 1 capsule by mouth daily. Take total of 2 daily     donepezil (ARICEPT) 5 MG tablet Take 1 tablet (5 mg total) by mouth at bedtime. 90 tablet 3   megestrol (MEGACE) 40 MG tablet Take 1 tablet (40 mg total) by mouth daily. (Patient taking differently: Take 40 mg by mouth daily as needed.) 90 tablet 3   No facility-administered medications prior to visit.    ROS: Review of Systems  Constitutional:  Positive for unexpected weight change. Negative for activity change, appetite change, chills and fatigue.  HENT:  Negative for congestion, mouth sores and sinus pressure.   Eyes:  Negative for visual disturbance.  Respiratory:  Negative for cough and chest tightness.   Gastrointestinal:  Negative for abdominal pain and nausea.  Genitourinary:  Negative for difficulty urinating, frequency and vaginal pain.  Musculoskeletal:  Negative for back pain and gait problem.  Skin:  Negative for pallor and rash.  Neurological:  Negative for dizziness, tremors, weakness, numbness and headaches.  Psychiatric/Behavioral:  Negative for confusion and sleep disturbance. The patient is nervous/anxious.    Objective:  BP 128/80 (BP Location: Left Arm)    Pulse 69    Temp 98.7 F (37.1 C) (Oral)    Ht 5\' 3"  (1.6 m)    Wt 89 lb 9.6 oz (40.6 kg)    SpO2 93%    BMI 15.87 kg/m   BP Readings from Last 3 Encounters:  05/16/21 128/80  03/31/21 134/68  03/09/21 122/72    Wt Readings from Last 3 Encounters:  05/16/21 89 lb 9.6 oz (40.6 kg)  03/31/21 88 lb (39.9 kg)  03/09/21 88 lb 9.6 oz  (40.2 kg)    Physical Exam Constitutional:      General: She is not in acute distress.    Appearance: She is well-developed.  HENT:     Head: Normocephalic.     Right Ear: External ear normal.     Left Ear: External ear normal.     Nose: Nose normal.  Eyes:     General:        Right eye: No discharge.        Left eye: No discharge.     Conjunctiva/sclera: Conjunctivae normal.     Pupils: Pupils are equal, round, and reactive to light.  Neck:     Thyroid: No thyromegaly.     Vascular: No JVD.     Trachea: No tracheal deviation.  Cardiovascular:     Rate and Rhythm: Normal rate and regular rhythm.     Heart sounds: Normal heart sounds.  Pulmonary:     Effort: No respiratory distress.     Breath sounds: No stridor. No wheezing.  Abdominal:     General: Bowel sounds are normal. There is no distension.     Palpations: Abdomen is soft. There is no mass.     Tenderness: There is no abdominal tenderness. There is no guarding or rebound.  Musculoskeletal:        General: No tenderness.  Cervical back: Normal range of motion and neck supple. No rigidity.  Lymphadenopathy:     Cervical: No cervical adenopathy.  Skin:    Findings: No erythema or rash.  Neurological:     Cranial Nerves: No cranial nerve deficit.     Motor: No abnormal muscle tone.     Coordination: Coordination normal.     Deep Tendon Reflexes: Reflexes normal.  Psychiatric:        Behavior: Behavior normal.        Thought Content: Thought content normal.        Judgment: Judgment normal.   Thin   Lab Results  Component Value Date   WBC 5.2 04/05/2021   HGB 12.7 04/05/2021   HCT 38.7 04/05/2021   PLT 219.0 04/05/2021   GLUCOSE 87 04/05/2021   CHOL 220 (H) 04/05/2021   TRIG 65.0 04/05/2021   HDL 79.50 04/05/2021   LDLDIRECT 145.7 05/12/2013   LDLCALC 127 (H) 04/05/2021   ALT 16 04/05/2021   AST 20 04/05/2021   NA 136 04/05/2021   K 4.1 04/05/2021   CL 102 04/05/2021   CREATININE 0.61  04/05/2021   BUN 20 04/05/2021   CO2 29 04/05/2021   TSH 3.26 04/05/2021    MM 3D SCREEN BREAST BILATERAL  Result Date: 04/10/2021 CLINICAL DATA:  Screening. EXAM: DIGITAL SCREENING BILATERAL MAMMOGRAM WITH TOMOSYNTHESIS AND CAD TECHNIQUE: Bilateral screening digital craniocaudal and mediolateral oblique mammograms were obtained. Bilateral screening digital breast tomosynthesis was performed. The images were evaluated with computer-aided detection. COMPARISON:  Previous exam(s). ACR Breast Density Category c: The breast tissue is heterogeneously dense, which may obscure small masses. FINDINGS: There are no findings suspicious for malignancy. IMPRESSION: No mammographic evidence of malignancy. A result letter of this screening mammogram will be mailed directly to the patient. RECOMMENDATION: Screening mammogram in one year. (Code:SM-B-01Y) BI-RADS CATEGORY  1: Negative. Electronically Signed   By: Nolon Nations M.D.   On: 04/10/2021 10:28    Assessment & Plan:   Problem List Items Addressed This Visit     Anxiety disorder    Doing better      Bronchiectasis (HCC)    Chronic cough - no change F/u w/Dr Elsworth Soho      Memory problem    Start  Aricept (Donepezil)      Situational mixed anxiety and depressive disorder    Stable      Underweight    Multifactorial. Re-start Megace      Weight loss    Wt Readings from Last 3 Encounters:  05/16/21 89 lb 9.6 oz (40.6 kg)  03/31/21 88 lb (39.9 kg)  03/09/21 88 lb 9.6 oz (40.2 kg)           No orders of the defined types were placed in this encounter.     Follow-up: Return in about 3 months (around 08/14/2021) for a follow-up visit.  Walker Kehr, MD

## 2021-05-16 NOTE — Assessment & Plan Note (Signed)
Wt Readings from Last 3 Encounters:  05/16/21 89 lb 9.6 oz (40.6 kg)  03/31/21 88 lb (39.9 kg)  03/09/21 88 lb 9.6 oz (40.2 kg)

## 2021-05-16 NOTE — Assessment & Plan Note (Signed)
Doing better.   

## 2021-05-16 NOTE — Patient Instructions (Signed)
Wt Readings from Last 3 Encounters:  05/16/21 89 lb 9.6 oz (40.6 kg)  03/31/21 88 lb (39.9 kg)  03/09/21 88 lb 9.6 oz (40.2 kg)

## 2021-05-16 NOTE — Assessment & Plan Note (Signed)
Multifactorial. Re-start Megace

## 2021-05-16 NOTE — Assessment & Plan Note (Signed)
Chronic cough - no change F/u w/Dr Elsworth Soho

## 2021-06-23 DIAGNOSIS — H524 Presbyopia: Secondary | ICD-10-CM | POA: Diagnosis not present

## 2021-06-23 DIAGNOSIS — Z961 Presence of intraocular lens: Secondary | ICD-10-CM | POA: Diagnosis not present

## 2021-06-28 DIAGNOSIS — Z85828 Personal history of other malignant neoplasm of skin: Secondary | ICD-10-CM | POA: Diagnosis not present

## 2021-06-28 DIAGNOSIS — L570 Actinic keratosis: Secondary | ICD-10-CM | POA: Diagnosis not present

## 2021-08-02 DIAGNOSIS — L661 Lichen planopilaris: Secondary | ICD-10-CM | POA: Diagnosis not present

## 2021-08-02 DIAGNOSIS — D485 Neoplasm of uncertain behavior of skin: Secondary | ICD-10-CM | POA: Diagnosis not present

## 2021-08-02 DIAGNOSIS — L905 Scar conditions and fibrosis of skin: Secondary | ICD-10-CM | POA: Diagnosis not present

## 2021-08-02 DIAGNOSIS — L821 Other seborrheic keratosis: Secondary | ICD-10-CM | POA: Diagnosis not present

## 2021-08-02 DIAGNOSIS — Z85828 Personal history of other malignant neoplasm of skin: Secondary | ICD-10-CM | POA: Diagnosis not present

## 2021-08-15 ENCOUNTER — Ambulatory Visit (INDEPENDENT_AMBULATORY_CARE_PROVIDER_SITE_OTHER): Payer: Medicare Other | Admitting: Internal Medicine

## 2021-08-15 ENCOUNTER — Encounter: Payer: Self-pay | Admitting: Internal Medicine

## 2021-08-15 DIAGNOSIS — R413 Other amnesia: Secondary | ICD-10-CM | POA: Diagnosis not present

## 2021-08-15 DIAGNOSIS — F439 Reaction to severe stress, unspecified: Secondary | ICD-10-CM

## 2021-08-15 DIAGNOSIS — M8580 Other specified disorders of bone density and structure, unspecified site: Secondary | ICD-10-CM | POA: Insufficient documentation

## 2021-08-15 DIAGNOSIS — E785 Hyperlipidemia, unspecified: Secondary | ICD-10-CM | POA: Insufficient documentation

## 2021-08-15 DIAGNOSIS — R634 Abnormal weight loss: Secondary | ICD-10-CM | POA: Diagnosis not present

## 2021-08-15 DIAGNOSIS — Z9229 Personal history of other drug therapy: Secondary | ICD-10-CM | POA: Insufficient documentation

## 2021-08-15 DIAGNOSIS — N63 Unspecified lump in unspecified breast: Secondary | ICD-10-CM | POA: Insufficient documentation

## 2021-08-15 DIAGNOSIS — R636 Underweight: Secondary | ICD-10-CM | POA: Diagnosis not present

## 2021-08-15 DIAGNOSIS — H903 Sensorineural hearing loss, bilateral: Secondary | ICD-10-CM | POA: Insufficient documentation

## 2021-08-15 NOTE — Progress Notes (Signed)
? ?Subjective:  ?Patient ID: Julie Montgomery, female    DOB: 03-Dec-1935  Age: 86 y.o. MRN: [id number] ? ?CC: No chief complaint on file. ? ? ?HPI ?Julie Montgomery presents for wt loss, memory issues, stress f/u. Getting her 3d set of dentures... ? ?Outpatient Medications Prior to Visit  ?Medication Sig Dispense Refill  ? Ascorbic Acid (VITAMIN C PO) Take by mouth.    ? Cholecalciferol (VITAMIN D3) 1000 UNITS CAPS Take 1 capsule by mouth daily. Take total of 2 daily    ? donepezil (ARICEPT) 5 MG tablet Take 1 tablet (5 mg total) by mouth at bedtime. 90 tablet 3  ? megestrol (MEGACE) 40 MG tablet Take 1 tablet (40 mg total) by mouth daily. (Patient taking differently: Take 40 mg by mouth daily as needed.) 90 tablet 3  ? ?No facility-administered medications prior to visit.  ? ? ?ROS: ?Review of Systems  ?Constitutional:  Positive for unexpected weight change. Negative for activity change, appetite change, chills and fatigue.  ?HENT:  Negative for congestion, mouth sores and sinus pressure.   ?Eyes:  Negative for visual disturbance.  ?Respiratory:  Negative for cough and chest tightness.   ?Gastrointestinal:  Negative for abdominal pain and nausea.  ?Genitourinary:  Negative for difficulty urinating, frequency and vaginal pain.  ?Musculoskeletal:  Negative for back pain and gait problem.  ?Skin:  Negative for pallor and rash.  ?Neurological:  Negative for dizziness, tremors, weakness, numbness and headaches.  ?Psychiatric/Behavioral:  Positive for decreased concentration. Negative for confusion, sleep disturbance and suicidal ideas. The patient is nervous/anxious.   ? ?Objective:  ?BP 110/60 (BP Location: Left Arm, Patient Position: Sitting, Cuff Size: Large)   Pulse 62   Temp 98.1 ?F (36.7 ?C) (Oral)   Ht '5\' 3"'$  (1.6 m)   Wt 89 lb 8 oz (40.6 kg)   SpO2 94%   BMI 15.85 kg/m?  ? ?BP Readings from Last 3 Encounters:  ?08/15/21 110/60  ?05/16/21 128/80  ?03/31/21 134/68  ? ? ?Wt Readings from Last 3 Encounters:   ?08/15/21 89 lb 8 oz (40.6 kg)  ?05/16/21 89 lb 9.6 oz (40.6 kg)  ?03/31/21 88 lb (39.9 kg)  ? ? ?Physical Exam ?Constitutional:   ?   General: She is not in acute distress. ?   Appearance: She is well-developed.  ?HENT:  ?   Head: Normocephalic.  ?   Right Ear: External ear normal.  ?   Left Ear: External ear normal.  ?   Nose: Nose normal.  ?Eyes:  ?   General:     ?   Right eye: No discharge.     ?   Left eye: No discharge.  ?   Conjunctiva/sclera: Conjunctivae normal.  ?   Pupils: Pupils are equal, round, and reactive to light.  ?Neck:  ?   Thyroid: No thyromegaly.  ?   Vascular: No JVD.  ?   Trachea: No tracheal deviation.  ?Cardiovascular:  ?   Rate and Rhythm: Normal rate and regular rhythm.  ?   Heart sounds: Normal heart sounds.  ?Pulmonary:  ?   Effort: No respiratory distress.  ?   Breath sounds: No stridor. No wheezing.  ?Abdominal:  ?   General: Bowel sounds are normal. There is no distension.  ?   Palpations: Abdomen is soft. There is no mass.  ?   Tenderness: There is no abdominal tenderness. There is no guarding or rebound.  ?Musculoskeletal:     ?   General:  No tenderness.  ?   Cervical back: Normal range of motion and neck supple. No rigidity.  ?Lymphadenopathy:  ?   Cervical: No cervical adenopathy.  ?Skin: ?   Findings: No erythema or rash.  ?Neurological:  ?   Cranial Nerves: No cranial nerve deficit.  ?   Motor: No abnormal muscle tone.  ?   Coordination: Coordination normal.  ?   Gait: Gait normal.  ?   Deep Tendon Reflexes: Reflexes normal.  ?Psychiatric:     ?   Behavior: Behavior normal.     ?   Thought Content: Thought content normal.     ?   Judgment: Judgment normal.  ? ? ?Lab Results  ?Component Value Date  ? WBC 5.2 04/05/2021  ? HGB 12.7 04/05/2021  ? HCT 38.7 04/05/2021  ? PLT 219.0 04/05/2021  ? GLUCOSE 87 04/05/2021  ? CHOL 220 (H) 04/05/2021  ? TRIG 65.0 04/05/2021  ? HDL 79.50 04/05/2021  ? LDLDIRECT 145.7 05/12/2013  ? LDLCALC 127 (H) 04/05/2021  ? ALT 16 04/05/2021  ? AST 20  04/05/2021  ? NA 136 04/05/2021  ? K 4.1 04/05/2021  ? CL 102 04/05/2021  ? CREATININE 0.61 04/05/2021  ? BUN 20 04/05/2021  ? CO2 29 04/05/2021  ? TSH 3.26 04/05/2021  ? ? ?MM 3D SCREEN BREAST BILATERAL ? ?Result Date: 04/10/2021 ?CLINICAL DATA:  Screening. EXAM: DIGITAL SCREENING BILATERAL MAMMOGRAM WITH TOMOSYNTHESIS AND CAD TECHNIQUE: Bilateral screening digital craniocaudal and mediolateral oblique mammograms were obtained. Bilateral screening digital breast tomosynthesis was performed. The images were evaluated with computer-aided detection. COMPARISON:  Previous exam(s). ACR Breast Density Category c: The breast tissue is heterogeneously dense, which may obscure small masses. FINDINGS: There are no findings suspicious for malignancy. IMPRESSION: No mammographic evidence of malignancy. A result letter of this screening mammogram will be mailed directly to the patient. RECOMMENDATION: Screening mammogram in one year. (Code:SM-B-01Y) BI-RADS CATEGORY  1: Negative. Electronically Signed   By: Nolon Nations M.D.   On: 04/10/2021 10:28  ? ? ?Assessment & Plan:  ? ?Problem List Items Addressed This Visit   ? ? Weight loss  ?  Not worse ?Not taking Megace ?Getting her 3d set of dentures... ?  ?  ? Stress at home  ?  Discussed  ? ?  ?  ? Memory problem  ?  Discussed ?Aricept (Donepezil) - not taking ?  ?  ? Underweight  ?  Not taking Megace ?Getting her 3d set of dentures... ?  ?  ?  ? ? ?No orders of the defined types were placed in this encounter. ?  ? ? ?Follow-up: No follow-ups on file. ? ?Walker Kehr, MD ?

## 2021-08-15 NOTE — Assessment & Plan Note (Signed)
Not taking Megace ?Getting her 3d set of dentures... ?

## 2021-08-15 NOTE — Assessment & Plan Note (Signed)
Not worse ?Not taking Megace ?Getting her 3d set of dentures... ?

## 2021-08-15 NOTE — Assessment & Plan Note (Addendum)
Discussed. Pt states her memory is fine. ?Aricept (Donepezil) - not taking ?

## 2021-08-15 NOTE — Assessment & Plan Note (Signed)
Discussed.

## 2021-08-16 ENCOUNTER — Encounter: Payer: Self-pay | Admitting: Internal Medicine

## 2021-08-16 NOTE — Telephone Encounter (Signed)
Pls advise on email.../lmb 

## 2021-08-19 ENCOUNTER — Other Ambulatory Visit: Payer: Self-pay | Admitting: Internal Medicine

## 2021-10-10 DIAGNOSIS — L570 Actinic keratosis: Secondary | ICD-10-CM | POA: Diagnosis not present

## 2021-10-10 DIAGNOSIS — Z85828 Personal history of other malignant neoplasm of skin: Secondary | ICD-10-CM | POA: Diagnosis not present

## 2021-11-14 ENCOUNTER — Encounter: Payer: Self-pay | Admitting: Internal Medicine

## 2021-11-14 ENCOUNTER — Ambulatory Visit (INDEPENDENT_AMBULATORY_CARE_PROVIDER_SITE_OTHER): Payer: Medicare Other | Admitting: Internal Medicine

## 2021-11-14 DIAGNOSIS — R413 Other amnesia: Secondary | ICD-10-CM

## 2021-11-14 DIAGNOSIS — F439 Reaction to severe stress, unspecified: Secondary | ICD-10-CM | POA: Diagnosis not present

## 2021-11-14 DIAGNOSIS — R634 Abnormal weight loss: Secondary | ICD-10-CM

## 2021-11-14 DIAGNOSIS — R636 Underweight: Secondary | ICD-10-CM

## 2021-11-14 LAB — CBC WITH DIFFERENTIAL/PLATELET
Basophils Absolute: 0 10*3/uL (ref 0.0–0.1)
Basophils Relative: 0.6 % (ref 0.0–3.0)
Eosinophils Absolute: 0.1 10*3/uL (ref 0.0–0.7)
Eosinophils Relative: 1 % (ref 0.0–5.0)
HCT: 39.4 % (ref 36.0–46.0)
Hemoglobin: 13.2 g/dL (ref 12.0–15.0)
Lymphocytes Relative: 21.4 % (ref 12.0–46.0)
Lymphs Abs: 1.1 10*3/uL (ref 0.7–4.0)
MCHC: 33.5 g/dL (ref 30.0–36.0)
MCV: 96 fl (ref 78.0–100.0)
Monocytes Absolute: 0.4 10*3/uL (ref 0.1–1.0)
Monocytes Relative: 8.5 % (ref 3.0–12.0)
Neutro Abs: 3.5 10*3/uL (ref 1.4–7.7)
Neutrophils Relative %: 68.5 % (ref 43.0–77.0)
Platelets: 193 10*3/uL (ref 150.0–400.0)
RBC: 4.11 Mil/uL (ref 3.87–5.11)
RDW: 12.8 % (ref 11.5–15.5)
WBC: 5.2 10*3/uL (ref 4.0–10.5)

## 2021-11-14 LAB — COMPREHENSIVE METABOLIC PANEL
ALT: 16 U/L (ref 0–35)
AST: 22 U/L (ref 0–37)
Albumin: 4.3 g/dL (ref 3.5–5.2)
Alkaline Phosphatase: 85 U/L (ref 39–117)
BUN: 23 mg/dL (ref 6–23)
CO2: 29 mEq/L (ref 19–32)
Calcium: 9.9 mg/dL (ref 8.4–10.5)
Chloride: 102 mEq/L (ref 96–112)
Creatinine, Ser: 0.64 mg/dL (ref 0.40–1.20)
GFR: 81.57 mL/min (ref >60.00)
Glucose, Bld: 100 mg/dL — ABNORMAL HIGH (ref 70–99)
Potassium: 4 mEq/L (ref 3.5–5.1)
Sodium: 136 mEq/L (ref 135–145)
Total Bilirubin: 0.5 mg/dL (ref 0.2–1.2)
Total Protein: 6.8 g/dL (ref 6.0–8.3)

## 2021-11-14 LAB — TSH: TSH: 3.37 u[IU]/mL (ref 0.35–5.50)

## 2021-11-14 NOTE — Assessment & Plan Note (Signed)
Discussed. Pt states her memory is fine.

## 2021-11-14 NOTE — Progress Notes (Signed)
Subjective:  Patient ID: Julie Montgomery, female    DOB: 01-06-1936  Age: 86 y.o. MRN: [id number]  CC: Follow-up (3 month )   HPI KILLIAN RESS presents for wt loss, anxiety, memory problems f/u  Outpatient Medications Prior to Visit  Medication Sig Dispense Refill   Ascorbic Acid (VITAMIN C PO) Take by mouth.     Cholecalciferol (VITAMIN D3) 1000 UNITS CAPS Take 1 capsule by mouth daily. Take total of 2 daily     megestrol (MEGACE) 40 MG tablet Take 1 tablet (40 mg total) by mouth daily. (Patient not taking: Reported on 11/14/2021) 90 tablet 3   No facility-administered medications prior to visit.    ROS: Review of Systems  Constitutional:  Positive for fatigue. Negative for activity change, appetite change, chills and unexpected weight change.  HENT:  Negative for congestion, mouth sores and sinus pressure.   Eyes:  Negative for visual disturbance.  Respiratory:  Negative for cough and chest tightness.   Gastrointestinal:  Negative for abdominal pain and nausea.  Genitourinary:  Negative for difficulty urinating, frequency and vaginal pain.  Musculoskeletal:  Negative for back pain and gait problem.  Skin:  Negative for pallor and rash.  Neurological:  Negative for dizziness, tremors, weakness, numbness and headaches.  Psychiatric/Behavioral:  Positive for decreased concentration and dysphoric mood. Negative for confusion, sleep disturbance and suicidal ideas. The patient is nervous/anxious.     Objective:  BP 122/60 (BP Location: Right Arm, Patient Position: Sitting, Cuff Size: Large)   Pulse 74   Temp 97.9 F (36.6 C) (Oral)   Ht '5\' 3"'$  (1.6 m)   Wt 89 lb 4 oz (40.5 kg)   SpO2 95%   BMI 15.81 kg/m   BP Readings from Last 3 Encounters:  11/14/21 122/60  08/15/21 110/60  05/16/21 128/80    Wt Readings from Last 3 Encounters:  11/14/21 89 lb 4 oz (40.5 kg)  08/15/21 89 lb 8 oz (40.6 kg)  05/16/21 89 lb 9.6 oz (40.6 kg)    Physical Exam Constitutional:       General: She is not in acute distress.    Appearance: She is well-developed. She is not ill-appearing or toxic-appearing.  HENT:     Head: Normocephalic.     Right Ear: External ear normal.     Left Ear: External ear normal.     Nose: Nose normal.  Eyes:     General:        Right eye: No discharge.        Left eye: No discharge.     Conjunctiva/sclera: Conjunctivae normal.     Pupils: Pupils are equal, round, and reactive to light.  Neck:     Thyroid: No thyromegaly.     Vascular: No JVD.     Trachea: No tracheal deviation.  Cardiovascular:     Rate and Rhythm: Normal rate and regular rhythm.     Heart sounds: Normal heart sounds.  Pulmonary:     Effort: No respiratory distress.     Breath sounds: No stridor. No wheezing.  Abdominal:     General: Bowel sounds are normal. There is no distension.     Palpations: Abdomen is soft. There is no mass.     Tenderness: There is no abdominal tenderness. There is no guarding or rebound.  Musculoskeletal:        General: No tenderness.     Cervical back: Normal range of motion and neck supple. No rigidity.  Lymphadenopathy:  Cervical: No cervical adenopathy.  Skin:    Findings: No erythema or rash.  Neurological:     Cranial Nerves: No cranial nerve deficit.     Motor: No abnormal muscle tone.     Coordination: Coordination normal.     Deep Tendon Reflexes: Reflexes normal.  Psychiatric:        Behavior: Behavior normal.        Thought Content: Thought content normal.        Judgment: Judgment normal.   thin  Lab Results  Component Value Date   WBC 5.2 04/05/2021   HGB 12.7 04/05/2021   HCT 38.7 04/05/2021   PLT 219.0 04/05/2021   GLUCOSE 87 04/05/2021   CHOL 220 (H) 04/05/2021   TRIG 65.0 04/05/2021   HDL 79.50 04/05/2021   LDLDIRECT 145.7 05/12/2013   LDLCALC 127 (H) 04/05/2021   ALT 16 04/05/2021   AST 20 04/05/2021   NA 136 04/05/2021   K 4.1 04/05/2021   CL 102 04/05/2021   CREATININE 0.61 04/05/2021   BUN  20 04/05/2021   CO2 29 04/05/2021   TSH 3.26 04/05/2021    MM 3D SCREEN BREAST BILATERAL  Result Date: 04/10/2021 CLINICAL DATA:  Screening. EXAM: DIGITAL SCREENING BILATERAL MAMMOGRAM WITH TOMOSYNTHESIS AND CAD TECHNIQUE: Bilateral screening digital craniocaudal and mediolateral oblique mammograms were obtained. Bilateral screening digital breast tomosynthesis was performed. The images were evaluated with computer-aided detection. COMPARISON:  Previous exam(s). ACR Breast Density Category c: The breast tissue is heterogeneously dense, which may obscure small masses. FINDINGS: There are no findings suspicious for malignancy. IMPRESSION: No mammographic evidence of malignancy. A result letter of this screening mammogram will be mailed directly to the patient. RECOMMENDATION: Screening mammogram in one year. (Code:SM-B-01Y) BI-RADS CATEGORY  1: Negative. Electronically Signed   By: Nolon Nations M.D.   On: 04/10/2021 10:28    Assessment & Plan:   Problem List Items Addressed This Visit     Memory problem    Discussed. Pt states her memory is fine.      Stress at home    Pt is helping her family members a lot       Underweight    Multifactorial. Lakayla thinks she needs new dentures. No recent wt loss       Relevant Orders   CBC with Differential/Platelet   Comprehensive metabolic panel   TSH   Weight loss    Discussed. Multifactorial. Jasiah thinks she needs new dentures. No recent wt loss      Relevant Orders   CBC with Differential/Platelet   Comprehensive metabolic panel   TSH      No orders of the defined types were placed in this encounter.     Follow-up: Return in about 3 months (around 02/14/2022) for a follow-up visit.  Walker Kehr, MD

## 2021-11-14 NOTE — Assessment & Plan Note (Signed)
Pt is helping her family members a lot

## 2021-11-14 NOTE — Assessment & Plan Note (Signed)
Multifactorial. Julie Montgomery thinks she needs new dentures. No recent wt loss

## 2021-11-14 NOTE — Assessment & Plan Note (Addendum)
Discussed. Multifactorial. Julie Montgomery thinks she needs new dentures. No recent wt loss

## 2021-11-15 DIAGNOSIS — D485 Neoplasm of uncertain behavior of skin: Secondary | ICD-10-CM | POA: Diagnosis not present

## 2021-11-15 DIAGNOSIS — Z85828 Personal history of other malignant neoplasm of skin: Secondary | ICD-10-CM | POA: Diagnosis not present

## 2021-11-15 DIAGNOSIS — L570 Actinic keratosis: Secondary | ICD-10-CM | POA: Diagnosis not present

## 2021-11-27 DIAGNOSIS — L0889 Other specified local infections of the skin and subcutaneous tissue: Secondary | ICD-10-CM | POA: Diagnosis not present

## 2021-11-27 DIAGNOSIS — Z85828 Personal history of other malignant neoplasm of skin: Secondary | ICD-10-CM | POA: Diagnosis not present

## 2021-11-27 DIAGNOSIS — L0109 Other impetigo: Secondary | ICD-10-CM | POA: Diagnosis not present

## 2021-11-29 ENCOUNTER — Encounter: Payer: Self-pay | Admitting: Internal Medicine

## 2021-11-29 ENCOUNTER — Ambulatory Visit (INDEPENDENT_AMBULATORY_CARE_PROVIDER_SITE_OTHER): Payer: Medicare Other | Admitting: Internal Medicine

## 2021-11-29 VITALS — BP 140/78 | HR 65 | Temp 98.1°F | Ht 63.0 in | Wt 92.0 lb

## 2021-11-29 DIAGNOSIS — T8149XA Infection following a procedure, other surgical site, initial encounter: Secondary | ICD-10-CM | POA: Diagnosis not present

## 2021-11-29 MED ORDER — NEOSPORIN ORIGINAL EX OINT: 1.0000 | TOPICAL_OINTMENT | Freq: Two times a day (BID) | CUTANEOUS | 0 refills | Status: AC

## 2021-11-29 NOTE — Patient Instructions (Signed)
Wound Infection A wound infection happens when tiny organisms (microorganisms) start to grow in a wound. A wound infection is most often caused by bacteria. Infection can cause the wound to break open or worsen. Wound infection needs treatment. If a wound infection is left untreated, complications can occur. Untreated wound infections may lead to an infection in the bloodstream (septicemia) or a bone infection (osteomyelitis). What are the causes? This condition is most often caused by bacteria growing in a wound. Other microorganisms, like yeast and fungi, can also cause wound infections. What increases the risk? The following factors may make you more likely to develop this condition: Having a weak body defense system (immune system). Having diabetes. Taking steroid medicines for a long time (chronic use). Smoking. Being an older person. Being overweight. Taking chemotherapy medicines. What are the signs or symptoms? Symptoms of this condition include: Having more redness, swelling, or pain at the wound site. Having more blood or fluid at the wound site. A bad smell coming from a wound or bandage (dressing). Having a fever. Feeling tired or fatigued. Having warmth at or around the wound. Having pus at the wound site. How is this diagnosed? This condition is diagnosed with a medical history and physical exam. You may also have a wound culture or blood tests or both. How is this treated? This condition is usually treated with an antibiotic medicine. The infection should improve 24-48 hours after you start antibiotics. After 24-48 hours, redness around the wound should stop spreading, and the wound should be less painful. Follow these instructions at home: Medicines Take or apply over-the-counter and prescription medicines only as told by your health care provider. If you were prescribed an antibiotic medicine, take or apply it as told by your health care provider. Do not stop using the  antibiotic even if you start to feel better. Wound care  Clean the wound each day, or as told by your health care provider. Wash the wound with mild soap and water. Rinse the wound with water to remove all soap. Pat the wound dry with a clean towel. Do not rub it. Follow instructions from your health care provider about how to take care of your wound. Make sure you: Wash your hands with soap and water before and after you change your dressing. If soap and water are not available, use hand sanitizer. Change your dressing as told by your health care provider. Leave stitches (sutures), skin glue, or adhesive strips in place if your wound has been closed. These skin closures may need to stay in place for 2 weeks or longer. If adhesive strip edges start to loosen and curl up, you may trim the loose edges. Do not remove adhesive strips completely unless your health care provider tells you to do that. Some wounds are left open to heal on their own. Check your wound every day for signs of infection. Watch for: More redness, swelling, or pain. More fluid or blood. Warmth. Pus or a bad smell. General instructions Keep the dressing dry until your health care provider says it can be removed. Do not take baths, swim, or use a hot tub until your health care provider approves. Ask your health care provider if you may take showers. You may only be allowed to take sponge baths. Raise (elevate) the injured area above the level of your heart while you are sitting or lying down. Do not scratch or pick at the wound. Keep all follow-up visits as told by your health care  provider. This is important. Contact a health care provider if: Your pain is not controlled with medicine. You have more redness, swelling, or pain around your wound. You have more fluid or blood coming from your wound. Your wound feels warm to the touch. You have pus coming from your wound. You continue to notice a bad smell coming from your  wound or your dressing. Your wound that was closed breaks open. Get help right away if: You have a red streak going away from your wound. You have a fever. Summary A wound infection happens when tiny organisms (microorganisms) start to grow in a wound. This condition is usually treated with an antibiotic medicine. Follow instructions from your health care provider about how to take care of your wound. Contact a health care provider if your wound infection does not begin to improve in 24-48 hours, or your symptoms worsen. Keep all follow-up visits as told by your health care provider. This is important. This information is not intended to replace advice given to you by your health care provider. Make sure you discuss any questions you have with your health care provider. Document Revised: 02/09/2021 Document Reviewed: 02/09/2021 Elsevier Patient Education  [place].

## 2021-11-29 NOTE — Progress Notes (Signed)
Subjective:  Patient ID: Julie Montgomery, female    DOB: 08/22/1935  Age: 86 y.o. MRN: [id number]  CC: Wound Infection   HPI CHENIKA NEVILS presents for f/up -  She is concerned about a lesion on the top of her scalp.  She a saw dermatologist a week ago and had a precancerous lesion removed.  She had some postop symptoms and she thought it was infected.  An antibiotic was prescribed but she is not taking it because she was concerned about side effects.  She feels like the area is improving but she wants to have it checked again.  Outpatient Medications Prior to Visit  Medication Sig Dispense Refill   Ascorbic Acid (VITAMIN C PO) Take by mouth.     Cholecalciferol (VITAMIN D3) 1000 UNITS CAPS Take 1 capsule by mouth daily. Take total of 2 daily     No facility-administered medications prior to visit.    ROS Review of Systems  Constitutional: Negative.  Negative for chills and fever.  HENT: Negative.    Eyes: Negative.   Respiratory:  Negative for chest tightness, shortness of breath and wheezing.   Cardiovascular:  Negative for chest pain, palpitations and leg swelling.  Endocrine: Negative.   Genitourinary: Negative.  Negative for difficulty urinating.  Musculoskeletal: Negative.  Negative for arthralgias.  Skin:  Positive for wound. Negative for color change.  Neurological:  Negative for dizziness and weakness.  Hematological:  Negative for adenopathy. Does not bruise/bleed easily.  Psychiatric/Behavioral: Negative.      Objective:  BP (!) 140/78 (BP Location: Right Arm, Patient Position: Sitting, Cuff Size: Normal)   Pulse 65   Temp 98.1 F (36.7 C) (Oral)   Ht '5\' 3"'$  (1.6 m)   Wt 92 lb (41.7 kg)   SpO2 96%   BMI 16.30 kg/m   BP Readings from Last 3 Encounters:  11/29/21 (!) 140/78  11/14/21 122/60  08/15/21 110/60    Wt Readings from Last 3 Encounters:  11/29/21 92 lb (41.7 kg)  11/14/21 89 lb 4 oz (40.5 kg)  08/15/21 89 lb 8 oz (40.6 kg)    Physical  Exam Vitals reviewed.  HENT:     Nose: Nose normal.     Mouth/Throat:     Mouth: Mucous membranes are moist.  Eyes:     General: No scleral icterus.    Conjunctiva/sclera: Conjunctivae normal.  Cardiovascular:     Rate and Rhythm: Normal rate and regular rhythm.  Pulmonary:     Breath sounds: No stridor. No wheezing, rhonchi or rales.  Abdominal:     General: Abdomen is flat.     Palpations: There is no mass.     Tenderness: There is no abdominal tenderness. There is no guarding.     Hernia: No hernia is present.  Musculoskeletal:        General: Normal range of motion.     Cervical back: Neck supple.  Skin:    General: Skin is warm and dry.     Comments: Top of the scalp there is a 2.5 cm granulated area.  There is no erythema, exudate, induration, fluctuance, or streaking.  Neurological:     General: No focal deficit present.     Lab Results  Component Value Date   WBC 5.2 11/14/2021   HGB 13.2 11/14/2021   HCT 39.4 11/14/2021   PLT 193.0 11/14/2021   GLUCOSE 100 (H) 11/14/2021   CHOL 220 (H) 04/05/2021   TRIG 65.0 04/05/2021  HDL 79.50 04/05/2021   LDLDIRECT 145.7 05/12/2013   LDLCALC 127 (H) 04/05/2021   ALT 16 11/14/2021   AST 22 11/14/2021   NA 136 11/14/2021   K 4.0 11/14/2021   CL 102 11/14/2021   CREATININE 0.64 11/14/2021   BUN 23 11/14/2021   CO2 29 11/14/2021   TSH 3.37 11/14/2021    MM 3D SCREEN BREAST BILATERAL  Result Date: 04/10/2021 CLINICAL DATA:  Screening. EXAM: DIGITAL SCREENING BILATERAL MAMMOGRAM WITH TOMOSYNTHESIS AND CAD TECHNIQUE: Bilateral screening digital craniocaudal and mediolateral oblique mammograms were obtained. Bilateral screening digital breast tomosynthesis was performed. The images were evaluated with computer-aided detection. COMPARISON:  Previous exam(s). ACR Breast Density Category c: The breast tissue is heterogeneously dense, which may obscure small masses. FINDINGS: There are no findings suspicious for malignancy.  IMPRESSION: No mammographic evidence of malignancy. A result letter of this screening mammogram will be mailed directly to the patient. RECOMMENDATION: Screening mammogram in one year. (Code:SM-B-01Y) BI-RADS CATEGORY  1: Negative. Electronically Signed   By: Nolon Nations M.D.   On: 04/10/2021 10:28    Assessment & Plan:   Seira was seen today for wound infection.  Diagnoses and all orders for this visit:  Wound infection following procedure- The wound does not look infected to me.  In the event there is a subtle infection I recommended that she start using Neosporin ointment to treatment and and prevent any infection. -     Neomycin-Bacitracin-Polymyxin (NEOSPORIN ORIGINAL) OINT; Apply 1 Application topically 2 (two) times daily for 7 days.   I am having [name] B. Naval start on Neosporin Original. I am also having her maintain her Vitamin D3 and Ascorbic Acid (VITAMIN C PO).  Meds ordered this encounter  Medications   Neomycin-Bacitracin-Polymyxin (NEOSPORIN ORIGINAL) OINT    Sig: Apply 1 Application topically 2 (two) times daily for 7 days.    Dispense:  14.2 g    Refill:  0     Follow-up: Return if symptoms worsen or fail to improve.  Scarlette Calico, MD

## 2021-12-12 ENCOUNTER — Inpatient Hospital Stay (HOSPITAL_BASED_OUTPATIENT_CLINIC_OR_DEPARTMENT_OTHER)
Admission: EM | Admit: 2021-12-12 | Discharge: 2021-12-18 | DRG: 371 | Disposition: A | Discharge status: Home Health | Payer: Medicare Other | Attending: Internal Medicine | Admitting: Internal Medicine

## 2021-12-12 ENCOUNTER — Emergency Department (HOSPITAL_BASED_OUTPATIENT_CLINIC_OR_DEPARTMENT_OTHER): Payer: Medicare Other

## 2021-12-12 ENCOUNTER — Other Ambulatory Visit: Payer: Self-pay

## 2021-12-12 ENCOUNTER — Encounter (HOSPITAL_BASED_OUTPATIENT_CLINIC_OR_DEPARTMENT_OTHER): Payer: Self-pay | Admitting: Emergency Medicine

## 2021-12-12 ENCOUNTER — Encounter (HOSPITAL_COMMUNITY): Payer: Self-pay

## 2021-12-12 DIAGNOSIS — Z801 Family history of malignant neoplasm of trachea, bronchus and lung: Secondary | ICD-10-CM

## 2021-12-12 DIAGNOSIS — E876 Hypokalemia: Secondary | ICD-10-CM | POA: Diagnosis not present

## 2021-12-12 DIAGNOSIS — Z882 Allergy status to sulfonamides status: Secondary | ICD-10-CM

## 2021-12-12 DIAGNOSIS — Z9049 Acquired absence of other specified parts of digestive tract: Secondary | ICD-10-CM | POA: Diagnosis not present

## 2021-12-12 DIAGNOSIS — Z8249 Family history of ischemic heart disease and other diseases of the circulatory system: Secondary | ICD-10-CM | POA: Diagnosis not present

## 2021-12-12 DIAGNOSIS — E860 Dehydration: Secondary | ICD-10-CM | POA: Diagnosis present

## 2021-12-12 DIAGNOSIS — T462X5A Adverse effect of other antidysrhythmic drugs, initial encounter: Secondary | ICD-10-CM | POA: Diagnosis not present

## 2021-12-12 DIAGNOSIS — R197 Diarrhea, unspecified: Secondary | ICD-10-CM | POA: Diagnosis not present

## 2021-12-12 DIAGNOSIS — I2510 Atherosclerotic heart disease of native coronary artery without angina pectoris: Secondary | ICD-10-CM | POA: Diagnosis present

## 2021-12-12 DIAGNOSIS — I700 Atherosclerosis of aorta: Secondary | ICD-10-CM | POA: Diagnosis present

## 2021-12-12 DIAGNOSIS — I083 Combined rheumatic disorders of mitral, aortic and tricuspid valves: Secondary | ICD-10-CM | POA: Diagnosis not present

## 2021-12-12 DIAGNOSIS — E43 Unspecified severe protein-calorie malnutrition: Secondary | ICD-10-CM | POA: Diagnosis not present

## 2021-12-12 DIAGNOSIS — I480 Paroxysmal atrial fibrillation: Secondary | ICD-10-CM | POA: Diagnosis not present

## 2021-12-12 DIAGNOSIS — Z888 Allergy status to other drugs, medicaments and biological substances status: Secondary | ICD-10-CM | POA: Diagnosis not present

## 2021-12-12 DIAGNOSIS — R509 Fever, unspecified: Secondary | ICD-10-CM | POA: Diagnosis not present

## 2021-12-12 DIAGNOSIS — Z8673 Personal history of transient ischemic attack (TIA), and cerebral infarction without residual deficits: Secondary | ICD-10-CM | POA: Diagnosis not present

## 2021-12-12 DIAGNOSIS — Z961 Presence of intraocular lens: Secondary | ICD-10-CM | POA: Diagnosis present

## 2021-12-12 DIAGNOSIS — E871 Hypo-osmolality and hyponatremia: Secondary | ICD-10-CM | POA: Diagnosis not present

## 2021-12-12 DIAGNOSIS — I2584 Coronary atherosclerosis due to calcified coronary lesion: Secondary | ICD-10-CM | POA: Diagnosis not present

## 2021-12-12 DIAGNOSIS — R001 Bradycardia, unspecified: Secondary | ICD-10-CM | POA: Diagnosis not present

## 2021-12-12 DIAGNOSIS — Z9842 Cataract extraction status, left eye: Secondary | ICD-10-CM | POA: Diagnosis not present

## 2021-12-12 DIAGNOSIS — L988 Other specified disorders of the skin and subcutaneous tissue: Secondary | ICD-10-CM | POA: Diagnosis not present

## 2021-12-12 DIAGNOSIS — E8770 Fluid overload, unspecified: Secondary | ICD-10-CM | POA: Diagnosis present

## 2021-12-12 DIAGNOSIS — R531 Weakness: Secondary | ICD-10-CM | POA: Diagnosis not present

## 2021-12-12 DIAGNOSIS — R0600 Dyspnea, unspecified: Secondary | ICD-10-CM | POA: Diagnosis not present

## 2021-12-12 DIAGNOSIS — R1084 Generalized abdominal pain: Secondary | ICD-10-CM | POA: Diagnosis not present

## 2021-12-12 DIAGNOSIS — I4891 Unspecified atrial fibrillation: Secondary | ICD-10-CM | POA: Diagnosis not present

## 2021-12-12 DIAGNOSIS — R945 Abnormal results of liver function studies: Secondary | ICD-10-CM | POA: Diagnosis not present

## 2021-12-12 DIAGNOSIS — Z9841 Cataract extraction status, right eye: Secondary | ICD-10-CM | POA: Diagnosis not present

## 2021-12-12 DIAGNOSIS — Z800 Family history of malignant neoplasm of digestive organs: Secondary | ICD-10-CM | POA: Diagnosis not present

## 2021-12-12 DIAGNOSIS — A0472 Enterocolitis due to Clostridium difficile, not specified as recurrent: Principal | ICD-10-CM | POA: Diagnosis present

## 2021-12-12 DIAGNOSIS — N281 Cyst of kidney, acquired: Secondary | ICD-10-CM | POA: Diagnosis not present

## 2021-12-12 DIAGNOSIS — Z833 Family history of diabetes mellitus: Secondary | ICD-10-CM

## 2021-12-12 DIAGNOSIS — Z681 Body mass index (BMI) 19 or less, adult: Secondary | ICD-10-CM

## 2021-12-12 DIAGNOSIS — Z85828 Personal history of other malignant neoplasm of skin: Secondary | ICD-10-CM | POA: Diagnosis not present

## 2021-12-12 DIAGNOSIS — K3189 Other diseases of stomach and duodenum: Secondary | ICD-10-CM | POA: Diagnosis not present

## 2021-12-12 DIAGNOSIS — E46 Unspecified protein-calorie malnutrition: Secondary | ICD-10-CM | POA: Diagnosis present

## 2021-12-12 LAB — CBC
HCT: 40.7 % (ref 36.0–46.0)
Hemoglobin: 13.9 g/dL (ref 12.0–15.0)
MCH: 32 pg (ref 26.0–34.0)
MCHC: 34.2 g/dL (ref 30.0–36.0)
MCV: 93.8 fL (ref 80.0–100.0)
Platelets: 181 10*3/uL (ref 150–400)
RBC: 4.34 MIL/uL (ref 3.87–5.11)
RDW: 12.5 % (ref 11.5–15.5)
WBC: 8.3 10*3/uL (ref 4.0–10.5)
nRBC: 0 % (ref 0.0–0.2)

## 2021-12-12 LAB — COMPREHENSIVE METABOLIC PANEL
ALT: 17 U/L (ref 0–44)
AST: 22 U/L (ref 15–41)
Albumin: 4.2 g/dL (ref 3.5–5.0)
Alkaline Phosphatase: 96 U/L (ref 38–126)
Anion gap: 8 (ref 5–15)
BUN: 19 mg/dL (ref 8–23)
CO2: 25 mmol/L (ref 22–32)
Calcium: 9.3 mg/dL (ref 8.9–10.3)
Chloride: 100 mmol/L (ref 98–111)
Creatinine, Ser: 0.62 mg/dL (ref 0.44–1.00)
GFR, Estimated: >60 mL/min (ref >60)
Glucose, Bld: 98 mg/dL (ref 70–99)
Potassium: 3.7 mmol/L (ref 3.5–5.1)
Sodium: 133 mmol/L — ABNORMAL LOW (ref 135–145)
Total Bilirubin: 0.6 mg/dL (ref 0.3–1.2)
Total Protein: 6.7 g/dL (ref 6.5–8.1)

## 2021-12-12 LAB — C DIFFICILE QUICK SCREEN W PCR REFLEX
C Diff antigen: POSITIVE — AB
C Diff toxin: NEGATIVE

## 2021-12-12 LAB — LIPASE, BLOOD: Lipase: 32 U/L (ref 11–51)

## 2021-12-12 MED ORDER — SODIUM CHLORIDE 0.9 % IV BOLUS
1000.0000 mL | Freq: Once | INTRAVENOUS | Status: AC
  Administered 2021-12-12: 1000 mL via INTRAVENOUS

## 2021-12-12 MED ORDER — IOHEXOL 300 MG/ML  SOLN
60.0000 mL | Freq: Once | INTRAMUSCULAR | Status: AC | PRN
  Administered 2021-12-12: 60 mL via INTRAVENOUS

## 2021-12-12 MED ORDER — OXYCODONE-ACETAMINOPHEN 5-325 MG PO TABS
1.0000 | ORAL_TABLET | Freq: Once | ORAL | Status: AC
  Administered 2021-12-12: 1 via ORAL
  Filled 2021-12-12: qty 1

## 2021-12-12 MED ORDER — VANCOMYCIN HCL 125 MG PO CAPS
125.0000 mg | ORAL_CAPSULE | Freq: Four times a day (QID) | ORAL | Status: DC
  Administered 2021-12-13: 125 mg via ORAL
  Filled 2021-12-12 (×2): qty 1

## 2021-12-12 NOTE — ED Notes (Signed)
Husband, Shadie Sweatman, requests call when pt is transferred.  [phone]

## 2021-12-12 NOTE — ED Triage Notes (Signed)
Pt arrives to ED via [facility] with c/o diarrhea and fever. Pt reports diarrhea q1 since 3am this morning. Associated symptoms include fever. She reports she recently finished an antibiotic for skin infection.

## 2021-12-12 NOTE — ED Notes (Signed)
Pt up to restroom.

## 2021-12-12 NOTE — ED Notes (Signed)
Pt via pv from home with diarrhea that began early this morning. Pt reports that she just finished a round of antibiotics. Pt reports that she has had diarrhea about every 1.5-2 hours today. Pt alert & oriented, nad noted.

## 2021-12-12 NOTE — Progress Notes (Signed)
Transferring facility: [facility] Requesting provider: Dr. Ernesto Rutherford (EDP at [facility]) Reason for transfer: admission for further evaluation and management of dehydration in setting of new onset loose stool, with CT e/o colitis.   86 year old female who presented to [facility] ED on 12/12/21 complaining of generalized weakness/fatigue over the last day in the setting of new onset loose stools over the last 24 hours.  She recently completed a course of Keflex as an outpatient for cellulitis, with interval improvement in her cellulitis, noting final dose of Keflex occurring 2 days ago.  However, over the last 1 day, she has developed new onset loose stool, reporting 1-2 such episodes per hour over the last day. she specifically reports at least 15 episodes of loose stool over the last 24 hours, in the absence of any melena or hematochezia.  Associated with generalized abdominal cramping.  No nausea/vomiting.  Patient conveys that she had previously had C. difficile on 1 occurrence, and feels that the constellation of symptoms associate with current presentation are similar to that which she was experiencing at the time of prior C. difficile diagnosis.  She notes that her weakness has been generalized in nature, denying any acute focal neurologic deficits.  Imaging notable for CT abdomen/pelvis reportedly showed evidence of colitis without evidence of bowel obstruction, abscess, or perforation.  Orders placed for C. difficile/GI panel by PCR, with results currently pending.   Subsequently, I accepted this patient for transfer for observation to a med telemetry bed at [facility] or [facility] (first available).        Check [email or web address] for on-call coverage.   Nursing staff, Please call [place] number on Amion as soon as patient's arrival, so appropriate admitting provider can evaluate the pt.     Babs Bertin, DO Hospitalist

## 2021-12-13 ENCOUNTER — Encounter (HOSPITAL_COMMUNITY): Payer: Self-pay | Admitting: Internal Medicine

## 2021-12-13 ENCOUNTER — Telehealth (HOSPITAL_COMMUNITY): Payer: Self-pay | Admitting: Pharmacy Technician

## 2021-12-13 ENCOUNTER — Other Ambulatory Visit (HOSPITAL_COMMUNITY): Payer: Self-pay

## 2021-12-13 DIAGNOSIS — R001 Bradycardia, unspecified: Secondary | ICD-10-CM | POA: Diagnosis not present

## 2021-12-13 DIAGNOSIS — Z801 Family history of malignant neoplasm of trachea, bronchus and lung: Secondary | ICD-10-CM | POA: Diagnosis not present

## 2021-12-13 DIAGNOSIS — E46 Unspecified protein-calorie malnutrition: Secondary | ICD-10-CM | POA: Diagnosis present

## 2021-12-13 DIAGNOSIS — Z833 Family history of diabetes mellitus: Secondary | ICD-10-CM | POA: Diagnosis not present

## 2021-12-13 DIAGNOSIS — Z882 Allergy status to sulfonamides status: Secondary | ICD-10-CM | POA: Diagnosis not present

## 2021-12-13 DIAGNOSIS — Z8249 Family history of ischemic heart disease and other diseases of the circulatory system: Secondary | ICD-10-CM | POA: Diagnosis not present

## 2021-12-13 DIAGNOSIS — Z961 Presence of intraocular lens: Secondary | ICD-10-CM | POA: Diagnosis not present

## 2021-12-13 DIAGNOSIS — Z85828 Personal history of other malignant neoplasm of skin: Secondary | ICD-10-CM | POA: Diagnosis not present

## 2021-12-13 DIAGNOSIS — E43 Unspecified severe protein-calorie malnutrition: Secondary | ICD-10-CM | POA: Diagnosis not present

## 2021-12-13 DIAGNOSIS — Z8673 Personal history of transient ischemic attack (TIA), and cerebral infarction without residual deficits: Secondary | ICD-10-CM | POA: Diagnosis not present

## 2021-12-13 DIAGNOSIS — Z9841 Cataract extraction status, right eye: Secondary | ICD-10-CM | POA: Diagnosis not present

## 2021-12-13 DIAGNOSIS — Z800 Family history of malignant neoplasm of digestive organs: Secondary | ICD-10-CM | POA: Diagnosis not present

## 2021-12-13 DIAGNOSIS — L988 Other specified disorders of the skin and subcutaneous tissue: Secondary | ICD-10-CM | POA: Diagnosis not present

## 2021-12-13 DIAGNOSIS — Z888 Allergy status to other drugs, medicaments and biological substances status: Secondary | ICD-10-CM | POA: Diagnosis not present

## 2021-12-13 DIAGNOSIS — R197 Diarrhea, unspecified: Secondary | ICD-10-CM | POA: Diagnosis present

## 2021-12-13 DIAGNOSIS — E876 Hypokalemia: Secondary | ICD-10-CM | POA: Diagnosis not present

## 2021-12-13 DIAGNOSIS — Z9842 Cataract extraction status, left eye: Secondary | ICD-10-CM | POA: Diagnosis not present

## 2021-12-13 DIAGNOSIS — E871 Hypo-osmolality and hyponatremia: Secondary | ICD-10-CM

## 2021-12-13 DIAGNOSIS — E860 Dehydration: Secondary | ICD-10-CM | POA: Diagnosis not present

## 2021-12-13 DIAGNOSIS — A0472 Enterocolitis due to Clostridium difficile, not specified as recurrent: Secondary | ICD-10-CM

## 2021-12-13 DIAGNOSIS — I480 Paroxysmal atrial fibrillation: Secondary | ICD-10-CM | POA: Diagnosis not present

## 2021-12-13 DIAGNOSIS — T462X5A Adverse effect of other antidysrhythmic drugs, initial encounter: Secondary | ICD-10-CM | POA: Diagnosis not present

## 2021-12-13 DIAGNOSIS — Z681 Body mass index (BMI) 19 or less, adult: Secondary | ICD-10-CM | POA: Diagnosis not present

## 2021-12-13 DIAGNOSIS — I2510 Atherosclerotic heart disease of native coronary artery without angina pectoris: Secondary | ICD-10-CM | POA: Diagnosis not present

## 2021-12-13 DIAGNOSIS — E8770 Fluid overload, unspecified: Secondary | ICD-10-CM | POA: Diagnosis not present

## 2021-12-13 DIAGNOSIS — I700 Atherosclerosis of aorta: Secondary | ICD-10-CM | POA: Diagnosis not present

## 2021-12-13 LAB — CBC WITH DIFFERENTIAL/PLATELET
Abs Immature Granulocytes: 0.03 10*3/uL (ref 0.00–0.07)
Basophils Absolute: 0 10*3/uL (ref 0.0–0.1)
Basophils Relative: 0 %
Eosinophils Absolute: 0 10*3/uL (ref 0.0–0.5)
Eosinophils Relative: 0 %
HCT: 35.4 % — ABNORMAL LOW (ref 36.0–46.0)
Hemoglobin: 11.9 g/dL — ABNORMAL LOW (ref 12.0–15.0)
Immature Granulocytes: 0 %
Lymphocytes Relative: 4 %
Lymphs Abs: 0.3 10*3/uL — ABNORMAL LOW (ref 0.7–4.0)
MCH: 32.4 pg (ref 26.0–34.0)
MCHC: 33.6 g/dL (ref 30.0–36.0)
MCV: 96.5 fL (ref 80.0–100.0)
Monocytes Absolute: 0.5 10*3/uL (ref 0.1–1.0)
Monocytes Relative: 7 %
Neutro Abs: 6.6 10*3/uL (ref 1.7–7.7)
Neutrophils Relative %: 89 %
Platelets: 137 10*3/uL — ABNORMAL LOW (ref 150–400)
RBC: 3.67 MIL/uL — ABNORMAL LOW (ref 3.87–5.11)
RDW: 12.7 % (ref 11.5–15.5)
WBC: 7.5 10*3/uL (ref 4.0–10.5)
nRBC: 0 % (ref 0.0–0.2)

## 2021-12-13 LAB — COMPREHENSIVE METABOLIC PANEL
ALT: 29 U/L (ref 0–44)
AST: 34 U/L (ref 15–41)
Albumin: 3.1 g/dL — ABNORMAL LOW (ref 3.5–5.0)
Alkaline Phosphatase: 72 U/L (ref 38–126)
Anion gap: 8 (ref 5–15)
BUN: 18 mg/dL (ref 8–23)
CO2: 24 mmol/L (ref 22–32)
Calcium: 8.6 mg/dL — ABNORMAL LOW (ref 8.9–10.3)
Chloride: 105 mmol/L (ref 98–111)
Creatinine, Ser: 0.63 mg/dL (ref 0.44–1.00)
GFR, Estimated: >60 mL/min (ref >60)
Glucose, Bld: 119 mg/dL — ABNORMAL HIGH (ref 70–99)
Potassium: 3.5 mmol/L (ref 3.5–5.1)
Sodium: 137 mmol/L (ref 135–145)
Total Bilirubin: 0.6 mg/dL (ref 0.3–1.2)
Total Protein: 5.8 g/dL — ABNORMAL LOW (ref 6.5–8.1)

## 2021-12-13 LAB — GASTROINTESTINAL PANEL BY PCR, STOOL (REPLACES STOOL CULTURE)

## 2021-12-13 LAB — MAGNESIUM: Magnesium: 1.8 mg/dL (ref 1.7–2.4)

## 2021-12-13 LAB — CLOSTRIDIUM DIFFICILE BY PCR, REFLEXED: Toxigenic C. Difficile by PCR: POSITIVE — AB

## 2021-12-13 MED ORDER — PROCHLORPERAZINE EDISYLATE 10 MG/2ML IJ SOLN
5.0000 mg | Freq: Once | INTRAMUSCULAR | Status: AC
  Administered 2021-12-13: 5 mg via INTRAVENOUS
  Filled 2021-12-13: qty 2

## 2021-12-13 MED ORDER — FIDAXOMICIN 200 MG PO TABS
200.0000 mg | ORAL_TABLET | Freq: Two times a day (BID) | ORAL | Status: DC
  Administered 2021-12-13 – 2021-12-18 (×11): 200 mg via ORAL
  Filled 2021-12-13 (×11): qty 1

## 2021-12-13 MED ORDER — ACETAMINOPHEN 325 MG PO TABS
650.0000 mg | ORAL_TABLET | Freq: Four times a day (QID) | ORAL | Status: DC | PRN
  Administered 2021-12-16: 650 mg via ORAL
  Filled 2021-12-13: qty 2

## 2021-12-13 MED ORDER — FENTANYL CITRATE PF 50 MCG/ML IJ SOSY
50.0000 ug | PREFILLED_SYRINGE | Freq: Once | INTRAMUSCULAR | Status: AC
  Administered 2021-12-13: 50 ug via INTRAVENOUS
  Filled 2021-12-13: qty 1

## 2021-12-13 MED ORDER — ONDANSETRON HCL 4 MG PO TABS
4.0000 mg | ORAL_TABLET | Freq: Four times a day (QID) | ORAL | Status: DC | PRN
  Administered 2021-12-14: 4 mg via ORAL
  Filled 2021-12-13: qty 1

## 2021-12-13 MED ORDER — ENOXAPARIN SODIUM 30 MG/0.3ML IJ SOSY
30.0000 mg | PREFILLED_SYRINGE | INTRAMUSCULAR | Status: DC
  Administered 2021-12-13 – 2021-12-14 (×2): 30 mg via SUBCUTANEOUS
  Filled 2021-12-13 (×2): qty 0.3

## 2021-12-13 MED ORDER — ONDANSETRON HCL 4 MG/2ML IJ SOLN: 4.0000 mg | Freq: Four times a day (QID) | INTRAMUSCULAR | Status: DC | PRN

## 2021-12-13 MED ORDER — OXYCODONE-ACETAMINOPHEN 5-325 MG PO TABS
1.0000 | ORAL_TABLET | ORAL | Status: DC | PRN
  Administered 2021-12-14: 1 via ORAL
  Filled 2021-12-13: qty 1

## 2021-12-13 MED ORDER — ACETAMINOPHEN 650 MG RE SUPP: 650.0000 mg | Freq: Four times a day (QID) | RECTAL | Status: DC | PRN

## 2021-12-13 MED ORDER — MORPHINE SULFATE (PF) 2 MG/ML IV SOLN: 2.0000 mg | INTRAVENOUS | Status: DC | PRN

## 2021-12-13 MED ORDER — LACTATED RINGERS IV SOLN: INTRAVENOUS | Status: AC

## 2021-12-13 NOTE — ED Notes (Signed)
Patient placed on 2LNC at this time following pain medication administration. RN aware.

## 2021-12-13 NOTE — ED Provider Notes (Signed)
[facility] EMERGENCY DEPT Provider Note   CSN: [id number] Arrival date & time: 12/12/21  1427     History  Chief Complaint  Patient presents with   Fever   Diarrhea    Julie Montgomery is a 86 y.o. female.  Patient is an 86 year old female with a past medical history of prior C. difficile infection and recent treatment for cellulitis with Keflex presenting to the emergency department with diarrhea.  Patient states that she completed her Keflex 2 days ago.  She states that she woke up at 3 AM with diarrhea this morning.  She states that she has had at least 2 episodes of diarrhea every hour.  She states that she is having brown stools.  She denies any black or bloody stools.  She states that she felt feverish but did not measure her temperature at home.  She states that she is having diffuse abdominal cramping.  She denies any nausea or vomiting.  She states she has had C. difficile once before in the past and this feels similar.  Denies any dysuria or hematuria.  The history is provided by the patient.  Fever Associated symptoms: diarrhea   Diarrhea Associated symptoms: fever        Home Medications Prior to Admission medications   Medication Sig Start Date End Date Taking? Authorizing Provider  Ascorbic Acid (VITAMIN C PO) Take by mouth.    [provider]  Cholecalciferol (VITAMIN D3) 1000 UNITS CAPS Take 1 capsule by mouth daily. Take total of 2 daily    [provider]      Allergies    Fluoxetine hcl, Lovastatin, Mometasone furo-formoterol fum, and Sulfa antibiotics    Review of Systems   Review of Systems  Constitutional:  Positive for fever.  Gastrointestinal:  Positive for diarrhea.    Physical Exam Updated Vital Signs BP (!) 117/58   Pulse 68   Temp 98.6 F (37 C) (Oral)   Resp 16   Ht 5' 2.5" (1.588 m)   Wt 39 kg   SpO2 92%   BMI 15.48 kg/m  Physical Exam Vitals and nursing note reviewed.  Constitutional:       Appearance: Normal appearance.     Comments: Mildly uncomfortable appearing  HENT:     Head: Normocephalic and atraumatic.     Nose: Nose normal.     Mouth/Throat:     Mouth: Mucous membranes are moist.  Eyes:     Extraocular Movements: Extraocular movements intact.     Conjunctiva/sclera: Conjunctivae normal.  Cardiovascular:     Rate and Rhythm: Normal rate and regular rhythm.     Pulses: Normal pulses.  Pulmonary:     Effort: Pulmonary effort is normal.     Breath sounds: Normal breath sounds.  Abdominal:     General: Abdomen is flat.     Palpations: Abdomen is soft.     Tenderness: There is abdominal tenderness (Diffusely tender to palpation). There is no guarding or rebound.  Musculoskeletal:        General: Normal range of motion.     Cervical back: Normal range of motion and neck supple.  Skin:    General: Skin is warm.  Neurological:     General: No focal deficit present.     Mental Status: She is alert and oriented to person, place, and time.     ED Results / Procedures / Treatments   Labs (all labs ordered are listed, but only abnormal results are displayed)  Labs Reviewed  C DIFFICILE QUICK SCREEN W PCR REFLEX   - Abnormal; Notable for the following components:      Result Value   C Diff antigen POSITIVE (*)    All other components within normal limits  COMPREHENSIVE METABOLIC PANEL - Abnormal; Notable for the following components:   Sodium 133 (*)    All other components within normal limits  GASTROINTESTINAL PANEL BY PCR, STOOL (REPLACES STOOL CULTURE)  CLOSTRIDIUM DIFFICILE BY PCR, REFLEXED  LIPASE, BLOOD  CBC    EKG None  Radiology CT Abdomen Pelvis W Contrast  Result Date: 12/12/2021 CLINICAL DATA:  Diarrhea and fever for 1 day.  86 year old female. EXAM: CT ABDOMEN AND PELVIS WITH CONTRAST TECHNIQUE: Multidetector CT imaging of the abdomen and pelvis was performed using the standard protocol following bolus administration of intravenous contrast.  RADIATION DOSE REDUCTION: This exam was performed according to the departmental dose-optimization program which includes automated exposure control, adjustment of the mA and/or kV according to patient size and/or use of iterative reconstruction technique. CONTRAST:  57m OMNIPAQUE IOHEXOL 300 MG/ML  SOLN COMPARISON:  None Available. FINDINGS: Lower chest: Lung bases are clear. Hepatobiliary: No focal hepatic lesion. No biliary duct dilatation. Common bile duct is normal. Pancreas: Pancreas is normal. No ductal dilatation. No pancreatic inflammation. Spleen: Normal spleen Adrenals/urinary tract: Adrenal glands normal. Simple fluid attenuation cyst in the LEFT kidney measures 15 mm. Ureters and bladder normal. Stomach/Bowel: The stomach, duodenum, and small bowel normal. Ascending, transverse and descending colon normal. No bowel obstruction. Mild submucosal edema and mucosal enhancement of the rectosigmoid colon. (Coronal image 28/6 and axial image 64/2). Vascular/Lymphatic: Abdominal aorta is normal caliber with atherosclerotic calcification. There is no retroperitoneal or periportal lymphadenopathy. No pelvic lymphadenopathy. Reproductive: Post hysterectomy.  Adnexa unremarkable Other: No free fluid. Musculoskeletal: No aggressive osseous lesion. IMPRESSION: 1. Submucosal edema and mucosal enhancement through the rectum. Differential includes mild proctitis versus segmental colitis (inflammatory bowel disease, infectious colitis, or unlikely ischemia colitis). 2. No evidence of bowel obstruction. 3. No additional abnormality of the bowel. 4. 1.5 cm left Bosniak 1 benign simple cyst. No follow-up imaging is recommended. JACR 2018 Feb; 264-273, Management of the Incidental RenalMass on CT, [employer] 2021; 814-848, Bosniak Classification of Cystic Renal Masses, Version 2019. 5. Electronically Signed   By: SSuzy BouchardM.D.   On: 12/12/2021 17:57    Procedures Procedures    Medications Ordered in  ED Medications  vancomycin (VANCOCIN) capsule 125 mg (has no administration in time range)  sodium chloride 0.9 % bolus 1,000 mL (0 mLs Intravenous Stopped 12/12/21 2248)  iohexol (OMNIPAQUE) 300 MG/ML solution 60 mL (60 mLs Intravenous Contrast Given 12/12/21 1730)  oxyCODONE-acetaminophen (PERCOCET/ROXICET) 5-325 MG per tablet 1 tablet (1 tablet Oral Given 12/12/21 1954)    ED Course/ Medical Decision Making/ A&P Clinical Course as of 12/13/21 0000  Tue Dec 12, 2021  2114 Patient continued to have frequent episodes of diarrhea in the ED and is overall fatigued and ill appearing.  Her CT scan shows evidence of colitis versus proctitis and her stool studies are pending.  Due to the patient's age and concern for developing severe dehydration in the setting of her diarrhea, you shared decision making with the patient and plan for admission for IV fluids and initiating vancomycin for presumed C. difficile.  Patient was signed out to and accepted by the hospitalist [VK]    Clinical Course User Index [VK] KOttie Glazier DO  Medical Decision Making This patient presents to the ED with chief complaint(s) of diarrhea with pertinent past medical history of C diff and recent cellulitis treatment with antibiotics which further complicates the presenting complaint. The complaint involves an extensive differential diagnosis and also carries with it a high risk of complications and morbidity.    The differential diagnosis includes C diff, dehydration, toxic megacolon, diverticulosis, diverticulitis   Additional history obtained: N/A  ED Course and Reassessment: Patient is continuing to have diarrhea in the ED and will have stool studies sent.  Her labs show no significant dehydration but due to her age and significant diarrhea, she will begin fluid resuscitation.  She will additionally have CT scan performed to evaluate for diverticulitis, diverticulosis or colitis as causes  of her diarrhea  Independent labs interpretation:  The following labs were independently interpreted: No significant signs of dehydration, no anemia  Independent visualization of imaging: - I independently visualized the following imaging with scope of interpretation limited to determining acute life threatening conditions related to emergency care: CT AP, which revealed colitis versus proctitis  Consultation: - Consulted or discussed management/test interpretation w/ external professional: Hospitalist  Consideration for admission or further workup: Patient will be admitted for fluid resuscitation and initiation of treatment for C. difficile pending her stool studies Social Determinants of health: N/A    Amount and/or Complexity of Data Reviewed Labs: ordered. Radiology: ordered.  Risk Prescription drug management. Decision regarding hospitalization.           Final Clinical Impression(s) / ED Diagnoses Final diagnoses:  Diarrhea of presumed infectious origin    Rx / DC Orders ED Discharge Orders     None         Ottie Glazier, DO 12/13/21 0006

## 2021-12-13 NOTE — Plan of Care (Signed)
Pt having multiple episodes of diarrhea.  No pain reported at this time.    Problem: Nutrition: Goal: Adequate nutrition will be maintained 12/13/2021 1200 by Hewitt Blade, Khushboo Chuck D, RN Outcome: Not Progressing 12/13/2021 1200 by Hewitt Blade, Ieisha Gao D, RN Outcome: Progressing   Problem: Elimination: Goal: Will not experience complications related to bowel motility 12/13/2021 1200 by Hewitt Blade, Maycol Hoying D, RN Outcome: Not Progressing 12/13/2021 1200 by Hewitt Blade, Helane Briceno D, RN Outcome: Progressing

## 2021-12-13 NOTE — H&P (Signed)
History and Physical    Patient: Julie Montgomery MRN: [id number] DOA: 12/12/2021  Date of Service: the patient was seen and examined on 12/13/2021  Patient coming from: Home  Chief Complaint:  Chief Complaint  Patient presents with   Fever   Diarrhea    HPI:   86 year old female with past medical history of prior C. difficile infection (2016 and 2018), basal cell carcinoma, bronchiectasis who presents to [facility] emergency department with complaints of diarrhea and weakness.  Patient reports that she had presented to see her dermatologist in late July and had a precancerous lesion removed.  In the days/weeks that followed patient developed redness warmth and tenderness around the lesion and was prescribed Keflex in the outpatient setting due to concerns for possible early cellulitis.  Patient took her Keflex as instructed, completing her course of therapy on 8/13.  On 8/14, the patient reports that she woke up from sleep with abdominal cramping.  She went to the bathroom she began to experience bouts of brown watery diarrhea.  Over the next 24 hours patient's abdominal cramping worsened.  Pain is moderate to severe in intensity and diffuse.  Patient has been experiencing associated generalized weakness and poor oral intake.  Patient continued to experience frequent bouts of watery diarrhea throughout the day and the patient eventually presented to [facility] emergency department for evaluation.  Upon evaluation in the emergency department patient was clinically found to be somewhat volume depleted with a mild hyponatremia.  CT imaging of the abdomen and pelvis was performed revealing submucosal edema and mucosal enhancement throughout the rectum.  Due to these findings and clinical evidence of volume depletion the patient was initiated on intravenous fluids, stool samples were collected to test for C. difficile and the hospitalist group was called.  Patient was excepted for transfer to  [facility] long hospital for continued medical care.    Review of Systems: Review of Systems  Constitutional:  Positive for chills and malaise/fatigue.  Gastrointestinal:  Positive for abdominal pain, diarrhea and nausea.  Neurological:  Positive for weakness.  All other systems reviewed and are negative.    Past Medical History:  Diagnosis Date   Adenomatous polyp of colon 2020   Allergy    Anxiety    Basal cell carcinoma (BCC)    Cataract    Depression    Helicobacter pylori gastritis    Osteopenia    Pneumonia    hx of 03/2008   Stroke [facility])    TIA long time ago    Past Surgical History:  Procedure Laterality Date   APPENDECTOMY     BREAST LUMPECTOMY     right   CATARACT EXTRACTION W/ INTRAOCULAR LENS  IMPLANT, BILATERAL Bilateral    CESAREAN SECTION     CHOLECYSTECTOMY     MOHS SURGERY     PARTIAL HYSTERECTOMY     ROTATOR CUFF REPAIR  2012   SKIN GRAFT     teeth remmoved      Social History:  reports that she has never smoked. She has never used smokeless tobacco. She reports that she does not drink alcohol and does not use drugs.  Allergies  Allergen Reactions   Lovastatin Other (See Comments)    Unknown reaction   Mometasone Furo-Formoterol Fum Other (See Comments)    Loss of appetite, laryngitis    Prozac [Fluoxetine Hcl] Other (See Comments)    Jumpy   Sulfa Antibiotics Other (See Comments)    Unknown reaction  Family History  Problem Relation Age of Onset   Diabetes Sister    Cancer Sister        lung ca   Coronary artery disease Brother    Dementia Brother    Cancer Brother        lung ca   Stomach cancer Paternal Grandmother    Colon cancer Son    Breast cancer Neg Hx    Esophageal cancer Neg Hx    Rectal cancer Neg Hx     Prior to Admission medications   Medication Sig Start Date End Date Taking? Authorizing Provider  Ascorbic Acid (VITAMIN C PO) Take by mouth.    [provider]  Cholecalciferol (VITAMIN D3) 1000 UNITS CAPS  Take 1 capsule by mouth daily. Take total of 2 daily    [provider]    Physical Exam:  Vitals:   12/13/21 0130 12/13/21 0247 12/13/21 0259 12/13/21 0634  BP: 134/67 135/66  (!) 152/83  Pulse: 74 73  86  Resp: 18 18    Temp: 98.2 F (36.8 C) 99.2 F (37.3 C)  99.1 F (37.3 C)  TempSrc: Oral Oral  Oral  SpO2: 95% 97%  100%  Weight:   41.2 kg   Height:   '5\' 2"'$  (1.575 m)     Constitutional: Awake alert and oriented x3, no associated distress.  Patient is cachectic. Skin: no rashes, no lesions, poor skin turgor noted. Eyes: Pupils are equally reactive to light.  No evidence of scleral icterus or conjunctival pallor.  ENMT: Dry mucous membranes noted.  Posterior pharynx clear of any exudate or lesions.   Neck: normal, supple, no masses, no thyromegaly.  No evidence of jugular venous distension.   Respiratory: clear to auscultation bilaterally, no wheezing, no crackles. Normal respiratory effort. No accessory muscle use.  Cardiovascular: Regular rate and rhythm, no murmurs / rubs / gallops. No extremity edema. 2+ pedal pulses. No carotid bruits.  Chest:   Nontender without crepitus or deformity.   Back:   Nontender without crepitus or deformity. Abdomen: Notable hyperactive bowel sounds.  Abdomen is diffusely tender however abdomen is soft.  No evidence of intra-abdominal masses.    Musculoskeletal: No joint deformity upper and lower extremities. Good ROM, no contractures.  Poor muscle tone. Neurologic: CN 2-12 grossly intact. Sensation intact.  Patient moving all 4 extremities spontaneously.  Patient is following all commands.  Patient is responsive to verbal stimuli.   Psychiatric: Patient exhibits normal mood with appropriate affect.  Patient seems to possess insight as to their current situation.    Data Reviewed:  I have personally reviewed and interpreted labs, imaging.  Significant findings are:  CT imaging of the abdomen pelvis revealing submucosal edema and  mucosal enhancement throughout the rectum.  No evidence of bowel obstruction. CBC revealing white blood cell count of 8.3 Chemistry revealing sodium of 133 Clostridioides difficile PCR positive  EKG: Personally reviewed.  Rhythm is normal sinus rhythm with frequent PACs    Assessment and Plan: * Clostridium difficile colitis Patient presenting with substantial weakness, volume depletion CT evidence of infection with mucosal enhancement throughout the rectum in addition to CDiff PCR being positive Patient with history of multiple episodes of C. difficile in the remote past in 2016 and 2018 Patient been placed on oral vancomycin every 6 hours Hydrating patient with intravenous isotonic fluids Clear liquid diet for now As needed analgesics for associated discomfort Monitoring electrolytes and correcting as necessary We will consider GI consultation if patient  clinically deteriorates  Dehydration with hyponatremia See assessment and plan above  Protein-calorie malnutrition, severe (HCC) Reports of poor oral intake in the setting of physical exam evidence of muscle wasting and BMI of 16.6 all concerning for severe protein calorie malnutrition Clear liquid diet for now but will ask for nutrition consultation for assistance in improving patient's nutrition once we advance her diet.       Code Status:  Full code  code status decision has been confirmed with: patient Family Communication: deferred   Consults: none  Severity of Illness:  The appropriate patient status for this patient is OBSERVATION. Observation status is judged to be reasonable and necessary in order to provide the required intensity of service to ensure the patient's safety. The patient's presenting symptoms, physical exam findings, and initial radiographic and laboratory data in the context of their medical condition is felt to place them at decreased risk for further clinical deterioration. Furthermore, it is  anticipated that the patient will be medically stable for discharge from the hospital within 2 midnights of admission.   Author:  Vernelle Emerald MD  12/13/2021 8:38 AM

## 2021-12-13 NOTE — TOC Benefit Eligibility Note (Signed)
Patient Teacher, English as a foreign language completed.    The patient is currently admitted and upon discharge could be taking Dificid 200 mg tablets.  The current 10 day co-pay is $1,345.67.   The patient is insured through [place], [place] Patient [place] Patient Advocate Team Direct Number: [phone]  Fax: [phone]

## 2021-12-13 NOTE — Assessment & Plan Note (Signed)
   Reports of poor oral intake in the setting of physical exam evidence of muscle wasting and BMI of 16.6 all concerning for severe protein calorie malnutrition  Clear liquid diet for now but will ask for nutrition consultation for assistance in improving patient's nutrition once we advance her diet.

## 2021-12-13 NOTE — Assessment & Plan Note (Addendum)
   Patient presenting with substantial weakness, volume depletion  CT evidence of infection with mucosal enhancement throughout the rectum in addition to CDiff PCR being positive  Patient with history of multiple episodes of C. difficile in the remote past in 2016 and 2018  Patient been placed on oral vancomycin every 6 hours  Hydrating patient with intravenous isotonic fluids  Clear liquid diet for now  As needed analgesics for associated discomfort  Monitoring electrolytes and correcting as necessary  We will consider GI consultation if patient clinically deteriorates

## 2021-12-13 NOTE — Assessment & Plan Note (Signed)
See assessment and plan above 

## 2021-12-13 NOTE — Progress Notes (Signed)
No charge note  86 year old female with history of prior C. difficile infections, basal cell carcinoma, bronchiectasis who comes into the hospital with diarrhea and weakness.  She saw her dermatologist about 3 weeks ago, had a precancerous lesion removed, and following that she was placed on Keflex due to developing redness and warmth around the removed lesion.  She finished Keflex, and the next day on 8/14 started developing profuse watery diarrhea 20+ stools a day.  She came to the hospital, was found to be dehydrated and stool studies were positive for C. difficile.  She was placed on p.o. vancomycin   C. difficile diarrhea-still with significant symptoms this morning, having ongoing loose stools.  This is her third episode of C. difficile (2016 and 2018).  Currently on p.o. Vanco, switch to Dificid, pharmacy is looking into seeing if that can be obtained at home.   Scheduled Meds:  enoxaparin (LOVENOX) injection  30 mg Subcutaneous Q24H   vancomycin  125 mg Oral QID   Continuous Infusions:  lactated ringers 100 mL/hr at 12/13/21 0432   PRN Meds:.acetaminophen **OR** acetaminophen, oxyCODONE-acetaminophen **OR** morphine injection, ondansetron **OR** ondansetron (ZOFRAN) IV  Julie Montgomery M. Cruzita Lederer, MD, PhD [facility]  Between 7 am - 7 pm you can contact me via Amion (for emergencies) or [place] (non urgent matters).  I am not available 7 pm - 7 am, please contact night coverage MD/APP via Amion

## 2021-12-13 NOTE — ED Provider Notes (Incomplete)
[facility] EMERGENCY DEPT Provider Note   CSN: [id number] Arrival date & time: 12/12/21  1427     History {Add pertinent medical, surgical, social history, OB history to HPI:1} Chief Complaint  Patient presents with  . Fever  . Diarrhea    Julie Montgomery is a 86 y.o. female.  Patient is an 86 year old female with a past medical history of prior C. difficile infection and recent treatment for cellulitis with Keflex presenting to the emergency department with diarrhea.  Patient states that she completed her Keflex 2 days ago.  She states that she woke up at 3 AM with diarrhea this morning.  She states that she has had at least 2 episodes of diarrhea every hour.  She states that she is having brown stools.  She denies any black or bloody stools.  She states that she felt feverish but did not measure her temperature at home.  She states that she is having diffuse abdominal cramping.  She denies any nausea or vomiting.  She states she has had C. difficile once before in the past and this feels similar.  Denies any dysuria or hematuria.  The history is provided by the patient.  Fever Associated symptoms: diarrhea   Diarrhea Associated symptoms: fever        Home Medications Prior to Admission medications   Medication Sig Start Date End Date Taking? Authorizing Provider  Ascorbic Acid (VITAMIN C PO) Take by mouth.    [provider]  Cholecalciferol (VITAMIN D3) 1000 UNITS CAPS Take 1 capsule by mouth daily. Take total of 2 daily    [provider]      Allergies    Fluoxetine hcl, Lovastatin, Mometasone furo-formoterol fum, and Sulfa antibiotics    Review of Systems   Review of Systems  Constitutional:  Positive for fever.  Gastrointestinal:  Positive for diarrhea.    Physical Exam Updated Vital Signs BP (!) 117/58   Pulse 68   Temp 98.6 F (37 C) (Oral)   Resp 16   Ht 5' 2.5" (1.588 m)   Wt 39 kg   SpO2 92%   BMI 15.48 kg/m   Physical Exam Vitals and nursing note reviewed.  Constitutional:      Appearance: Normal appearance.     Comments: Mildly uncomfortable appearing  Neurological:     Mental Status: She is alert.     ED Results / Procedures / Treatments   Labs (all labs ordered are listed, but only abnormal results are displayed) Labs Reviewed  C DIFFICILE QUICK SCREEN W PCR REFLEX   - Abnormal; Notable for the following components:      Result Value   C Diff antigen POSITIVE (*)    All other components within normal limits  COMPREHENSIVE METABOLIC PANEL - Abnormal; Notable for the following components:   Sodium 133 (*)    All other components within normal limits  GASTROINTESTINAL PANEL BY PCR, STOOL (REPLACES STOOL CULTURE)  CLOSTRIDIUM DIFFICILE BY PCR, REFLEXED  LIPASE, BLOOD  CBC    EKG None  Radiology CT Abdomen Pelvis W Contrast  Result Date: 12/12/2021 CLINICAL DATA:  Diarrhea and fever for 1 day.  86 year old female. EXAM: CT ABDOMEN AND PELVIS WITH CONTRAST TECHNIQUE: Multidetector CT imaging of the abdomen and pelvis was performed using the standard protocol following bolus administration of intravenous contrast. RADIATION DOSE REDUCTION: This exam was performed according to the departmental dose-optimization program which includes automated exposure control, adjustment of the mA and/or kV according to patient  size and/or use of iterative reconstruction technique. CONTRAST:  88m OMNIPAQUE IOHEXOL 300 MG/ML  SOLN COMPARISON:  None Available. FINDINGS: Lower chest: Lung bases are clear. Hepatobiliary: No focal hepatic lesion. No biliary duct dilatation. Common bile duct is normal. Pancreas: Pancreas is normal. No ductal dilatation. No pancreatic inflammation. Spleen: Normal spleen Adrenals/urinary tract: Adrenal glands normal. Simple fluid attenuation cyst in the LEFT kidney measures 15 mm. Ureters and bladder normal. Stomach/Bowel: The stomach, duodenum, and small bowel normal. Ascending,  transverse and descending colon normal. No bowel obstruction. Mild submucosal edema and mucosal enhancement of the rectosigmoid colon. (Coronal image 28/6 and axial image 64/2). Vascular/Lymphatic: Abdominal aorta is normal caliber with atherosclerotic calcification. There is no retroperitoneal or periportal lymphadenopathy. No pelvic lymphadenopathy. Reproductive: Post hysterectomy.  Adnexa unremarkable Other: No free fluid. Musculoskeletal: No aggressive osseous lesion. IMPRESSION: 1. Submucosal edema and mucosal enhancement through the rectum. Differential includes mild proctitis versus segmental colitis (inflammatory bowel disease, infectious colitis, or unlikely ischemia colitis). 2. No evidence of bowel obstruction. 3. No additional abnormality of the bowel. 4. 1.5 cm left Bosniak 1 benign simple cyst. No follow-up imaging is recommended. JACR 2018 Feb; 264-273, Management of the Incidental RenalMass on CT, [employer] 2021; 814-848, Bosniak Classification of Cystic Renal Masses, Version 2019. 5. Electronically Signed   By: SSuzy BouchardM.D.   On: 12/12/2021 17:57    Procedures Procedures  {Document cardiac monitor, telemetry assessment procedure when appropriate:1}  Medications Ordered in ED Medications  vancomycin (VANCOCIN) capsule 125 mg (has no administration in time range)  sodium chloride 0.9 % bolus 1,000 mL (0 mLs Intravenous Stopped 12/12/21 2248)  iohexol (OMNIPAQUE) 300 MG/ML solution 60 mL (60 mLs Intravenous Contrast Given 12/12/21 1730)  oxyCODONE-acetaminophen (PERCOCET/ROXICET) 5-325 MG per tablet 1 tablet (1 tablet Oral Given 12/12/21 1954)    ED Course/ Medical Decision Making/ A&P Clinical Course as of 12/13/21 0000  Tue Dec 12, 2021  2114 Patient continued to have frequent episodes of diarrhea in the ED and is overall fatigued and ill appearing.  Her CT scan shows evidence of colitis versus proctitis and her stool studies are pending.  Due to the patient's age and  concern for developing severe dehydration in the setting of her diarrhea, you shared decision making with the patient and plan for admission for IV fluids and initiating vancomycin for presumed C. difficile.  Patient was signed out to and accepted by the hospitalist [VK]    Clinical Course User Index [VK] KOttie Glazier DO                           Medical Decision Making Amount and/or Complexity of Data Reviewed Labs: ordered. Radiology: ordered.  Risk Prescription drug management. Decision regarding hospitalization.   ***  {Document critical care time when appropriate:1} {Document review of labs and clinical decision tools ie heart score, Chads2Vasc2 etc:1}  {Document your independent review of radiology images, and any outside records:1} {Document your discussion with family members, caretakers, and with consultants:1} {Document social determinants of health affecting pt's care:1} {Document your decision making why or why not admission, treatments were needed:1} Final Clinical Impression(s) / ED Diagnoses Final diagnoses:  Diarrhea of presumed infectious origin    Rx / DC Orders ED Discharge Orders     None

## 2021-12-13 NOTE — Telephone Encounter (Signed)
Pharmacy Patient Advocate Encounter  Insurance verification completed.    The patient is insured through [employer]   The patient is currently admitted and ran test claims for the following: Dificid.  Copays and coinsurance results were relayed to Inpatient clinical team. 

## 2021-12-14 ENCOUNTER — Inpatient Hospital Stay (HOSPITAL_COMMUNITY): Payer: Medicare Other

## 2021-12-14 DIAGNOSIS — Z8249 Family history of ischemic heart disease and other diseases of the circulatory system: Secondary | ICD-10-CM | POA: Diagnosis not present

## 2021-12-14 DIAGNOSIS — A0472 Enterocolitis due to Clostridium difficile, not specified as recurrent: Secondary | ICD-10-CM | POA: Diagnosis present

## 2021-12-14 DIAGNOSIS — Z9049 Acquired absence of other specified parts of digestive tract: Secondary | ICD-10-CM | POA: Diagnosis not present

## 2021-12-14 DIAGNOSIS — I700 Atherosclerosis of aorta: Secondary | ICD-10-CM | POA: Diagnosis present

## 2021-12-14 DIAGNOSIS — E8770 Fluid overload, unspecified: Secondary | ICD-10-CM | POA: Diagnosis present

## 2021-12-14 DIAGNOSIS — Z888 Allergy status to other drugs, medicaments and biological substances status: Secondary | ICD-10-CM | POA: Diagnosis not present

## 2021-12-14 DIAGNOSIS — I2510 Atherosclerotic heart disease of native coronary artery without angina pectoris: Secondary | ICD-10-CM | POA: Diagnosis present

## 2021-12-14 DIAGNOSIS — Z801 Family history of malignant neoplasm of trachea, bronchus and lung: Secondary | ICD-10-CM | POA: Diagnosis not present

## 2021-12-14 DIAGNOSIS — Z882 Allergy status to sulfonamides status: Secondary | ICD-10-CM | POA: Diagnosis not present

## 2021-12-14 DIAGNOSIS — E876 Hypokalemia: Secondary | ICD-10-CM | POA: Diagnosis not present

## 2021-12-14 DIAGNOSIS — I480 Paroxysmal atrial fibrillation: Secondary | ICD-10-CM | POA: Diagnosis present

## 2021-12-14 DIAGNOSIS — R945 Abnormal results of liver function studies: Secondary | ICD-10-CM | POA: Diagnosis not present

## 2021-12-14 DIAGNOSIS — I083 Combined rheumatic disorders of mitral, aortic and tricuspid valves: Secondary | ICD-10-CM | POA: Diagnosis not present

## 2021-12-14 DIAGNOSIS — R001 Bradycardia, unspecified: Secondary | ICD-10-CM | POA: Diagnosis not present

## 2021-12-14 DIAGNOSIS — E860 Dehydration: Secondary | ICD-10-CM | POA: Diagnosis present

## 2021-12-14 DIAGNOSIS — Z681 Body mass index (BMI) 19 or less, adult: Secondary | ICD-10-CM | POA: Diagnosis not present

## 2021-12-14 DIAGNOSIS — R197 Diarrhea, unspecified: Secondary | ICD-10-CM | POA: Diagnosis present

## 2021-12-14 DIAGNOSIS — Z85828 Personal history of other malignant neoplasm of skin: Secondary | ICD-10-CM | POA: Diagnosis not present

## 2021-12-14 DIAGNOSIS — Z8673 Personal history of transient ischemic attack (TIA), and cerebral infarction without residual deficits: Secondary | ICD-10-CM | POA: Diagnosis not present

## 2021-12-14 DIAGNOSIS — T462X5A Adverse effect of other antidysrhythmic drugs, initial encounter: Secondary | ICD-10-CM | POA: Diagnosis not present

## 2021-12-14 DIAGNOSIS — Z9842 Cataract extraction status, left eye: Secondary | ICD-10-CM | POA: Diagnosis not present

## 2021-12-14 DIAGNOSIS — R0600 Dyspnea, unspecified: Secondary | ICD-10-CM | POA: Diagnosis not present

## 2021-12-14 DIAGNOSIS — L988 Other specified disorders of the skin and subcutaneous tissue: Secondary | ICD-10-CM | POA: Diagnosis present

## 2021-12-14 DIAGNOSIS — Z833 Family history of diabetes mellitus: Secondary | ICD-10-CM | POA: Diagnosis not present

## 2021-12-14 DIAGNOSIS — Z9841 Cataract extraction status, right eye: Secondary | ICD-10-CM | POA: Diagnosis not present

## 2021-12-14 DIAGNOSIS — I2584 Coronary atherosclerosis due to calcified coronary lesion: Secondary | ICD-10-CM | POA: Diagnosis not present

## 2021-12-14 DIAGNOSIS — Z800 Family history of malignant neoplasm of digestive organs: Secondary | ICD-10-CM | POA: Diagnosis not present

## 2021-12-14 DIAGNOSIS — E43 Unspecified severe protein-calorie malnutrition: Secondary | ICD-10-CM | POA: Diagnosis present

## 2021-12-14 DIAGNOSIS — Z961 Presence of intraocular lens: Secondary | ICD-10-CM | POA: Diagnosis present

## 2021-12-14 DIAGNOSIS — I4891 Unspecified atrial fibrillation: Secondary | ICD-10-CM | POA: Diagnosis not present

## 2021-12-14 DIAGNOSIS — E871 Hypo-osmolality and hyponatremia: Secondary | ICD-10-CM | POA: Diagnosis present

## 2021-12-14 LAB — CBC WITH DIFFERENTIAL/PLATELET
Abs Immature Granulocytes: 0.01 10*3/uL (ref 0.00–0.07)
Basophils Absolute: 0 10*3/uL (ref 0.0–0.1)
Basophils Relative: 0 %
Eosinophils Absolute: 0 10*3/uL (ref 0.0–0.5)
Eosinophils Relative: 0 %
HCT: 39.2 % (ref 36.0–46.0)
Hemoglobin: 13.4 g/dL (ref 12.0–15.0)
Immature Granulocytes: 0 %
Lymphocytes Relative: 12 %
Lymphs Abs: 0.7 10*3/uL (ref 0.7–4.0)
MCH: 32.8 pg (ref 26.0–34.0)
MCHC: 34.2 g/dL (ref 30.0–36.0)
MCV: 96.1 fL (ref 80.0–100.0)
Monocytes Absolute: 0.8 10*3/uL (ref 0.1–1.0)
Monocytes Relative: 14 %
Neutro Abs: 4 10*3/uL (ref 1.7–7.7)
Neutrophils Relative %: 74 %
Platelets: 155 10*3/uL (ref 150–400)
RBC: 4.08 MIL/uL (ref 3.87–5.11)
RDW: 12.4 % (ref 11.5–15.5)
WBC: 5.5 10*3/uL (ref 4.0–10.5)
nRBC: 0 % (ref 0.0–0.2)

## 2021-12-14 LAB — TSH: TSH: 6.357 u[IU]/mL — ABNORMAL HIGH (ref 0.350–4.500)

## 2021-12-14 LAB — COMPREHENSIVE METABOLIC PANEL
ALT: 42 U/L (ref 0–44)
AST: 83 U/L — ABNORMAL HIGH (ref 15–41)
Albumin: 3.4 g/dL — ABNORMAL LOW (ref 3.5–5.0)
Alkaline Phosphatase: 76 U/L (ref 38–126)
Anion gap: 9 (ref 5–15)
BUN: 9 mg/dL (ref 8–23)
CO2: 25 mmol/L (ref 22–32)
Calcium: 9 mg/dL (ref 8.9–10.3)
Chloride: 100 mmol/L (ref 98–111)
Creatinine, Ser: 0.52 mg/dL (ref 0.44–1.00)
GFR, Estimated: >60 mL/min (ref >60)
Glucose, Bld: 94 mg/dL (ref 70–99)
Potassium: 3.2 mmol/L — ABNORMAL LOW (ref 3.5–5.1)
Sodium: 134 mmol/L — ABNORMAL LOW (ref 135–145)
Total Bilirubin: 0.8 mg/dL (ref 0.3–1.2)
Total Protein: 6.3 g/dL — ABNORMAL LOW (ref 6.5–8.1)

## 2021-12-14 LAB — ECHOCARDIOGRAM COMPLETE
Height: 62 in
MV M vel: 4.29 m/s
MV Peak grad: 73.6 mmHg
Radius: 0.2 cm
S' Lateral: 2.9 cm
Weight: 1453.27 oz

## 2021-12-14 LAB — MAGNESIUM: Magnesium: 1.8 mg/dL (ref 1.7–2.4)

## 2021-12-14 MED ORDER — SODIUM CHLORIDE 0.9 % IV BOLUS
1000.0000 mL | Freq: Once | INTRAVENOUS | Status: AC
  Administered 2021-12-14: 1000 mL via INTRAVENOUS

## 2021-12-14 MED ORDER — METOPROLOL TARTRATE 25 MG PO TABS
25.0000 mg | ORAL_TABLET | Freq: Two times a day (BID) | ORAL | Status: DC
  Administered 2021-12-14: 25 mg via ORAL
  Filled 2021-12-14: qty 1

## 2021-12-14 MED ORDER — POTASSIUM CHLORIDE CRYS ER 20 MEQ PO TBCR
40.0000 meq | EXTENDED_RELEASE_TABLET | Freq: Once | ORAL | Status: AC
Start: 2021-12-14 — End: 2021-12-14
  Administered 2021-12-14: 40 meq via ORAL
  Filled 2021-12-14: qty 2

## 2021-12-14 MED ORDER — HEPARIN (PORCINE) 25000 UT/250ML-% IV SOLN
750.0000 [IU]/h | INTRAVENOUS | Status: AC
  Administered 2021-12-14: 550 [IU]/h via INTRAVENOUS
  Filled 2021-12-14: qty 250

## 2021-12-14 MED ORDER — METOPROLOL TARTRATE 25 MG PO TABS
25.0000 mg | ORAL_TABLET | Freq: Three times a day (TID) | ORAL | Status: DC
Start: 2021-12-14 — End: 2021-12-15
  Administered 2021-12-14 (×2): 25 mg via ORAL
  Filled 2021-12-14 (×3): qty 1

## 2021-12-14 MED ORDER — METOPROLOL TARTRATE 5 MG/5ML IV SOLN
2.5000 mg | Freq: Once | INTRAVENOUS | Status: AC
Start: 2021-12-14 — End: 2021-12-14
  Administered 2021-12-14: 2.5 mg via INTRAVENOUS
  Filled 2021-12-14: qty 5

## 2021-12-14 MED ORDER — AMIODARONE HCL IN DEXTROSE 360-4.14 MG/200ML-% IV SOLN
30.0000 mg/h | INTRAVENOUS | Status: DC
  Administered 2021-12-14 – 2021-12-15 (×2): 30 mg/h via INTRAVENOUS
  Filled 2021-12-14 (×2): qty 200

## 2021-12-14 MED ORDER — AMIODARONE LOAD VIA INFUSION
150.0000 mg | Freq: Once | INTRAVENOUS | Status: AC
  Administered 2021-12-14: 150 mg via INTRAVENOUS
  Filled 2021-12-14: qty 83.34

## 2021-12-14 MED ORDER — AMIODARONE HCL IN DEXTROSE 360-4.14 MG/200ML-% IV SOLN
60.0000 mg/h | INTRAVENOUS | Status: AC
  Administered 2021-12-14: 60 mg/h via INTRAVENOUS
  Filled 2021-12-14: qty 200

## 2021-12-14 MED ORDER — BOOST PLUS PO LIQD
237.0000 mL | Freq: Two times a day (BID) | ORAL | Status: DC
  Administered 2021-12-15 – 2021-12-18 (×5): 237 mL via ORAL
  Filled 2021-12-14 (×8): qty 237

## 2021-12-14 MED ORDER — ADULT MULTIVITAMIN W/MINERALS CH
1.0000 | ORAL_TABLET | Freq: Every day | ORAL | Status: DC
  Administered 2021-12-14 – 2021-12-18 (×5): 1 via ORAL
  Filled 2021-12-14 (×5): qty 1

## 2021-12-14 NOTE — Progress Notes (Signed)
Echocardiogram 2D Echocardiogram has been performed.  [place] Deputy Jenne Sellinger RDCS 12/14/2021, 2:24 PM

## 2021-12-14 NOTE — Consult Note (Signed)
CARDIOLOGY CONSULT NOTE  Patient ID: Julie Montgomery MRN: [id number] DOB/AGE: 1936/01/06 86 y.o.  Admit date: 12/12/2021 Attending physician: Caren Griffins, MD Primary Physician:  Cassandria Anger, MD Outpatient Cardiologist: NA Inpatient Cardiologist: Rex Kras, DO, [facility]  Reason of consultation: Atrial fibrillation with rapid ventricular rate Referring physician: Caren Griffins, MD  Chief complaint: chills and diarrhea  HPI:  Julie Montgomery is a 86 y.o. Caucasian female who presents with a chief complaint of " chills and diarrhea." Her past medical history and cardiovascular risk factors include: Mild coronary artery calcification, history of C. difficile infection (2016 and 2018), Aortic Atherosclerosis (CT 03/2019), basal cell carcinoma, bronchiectasis, history of TIA.  Patient was recently placed on Keflex and after completing the dose he started experiencing intractable diarrhea and abdominal cramping and presents to the hospital for further evaluation and management.  She is currently being evaluated and treated for C. difficile infection and was noted to be in A-fib with RVR.  Cardiology has been asked for further evaluation and management.  Patient resting in bed comfortably and accompanied by her husband and son at bedside.  She denies angina pectoris or heart failure symptoms.  Prior to arrival patient was complaining of feeling lightheaded, dizziness at times.  She also had chills prior to presenting to the ED and but no fevers.  No prior history of atrial fibrillation.  No prior history of intracranial or gastrointestinal bleeding.  No recent cardiovascular work-up.  ALLERGIES: Allergies  Allergen Reactions   Lovastatin Other (See Comments)    Unknown reaction   Mometasone Furo-Formoterol Fum Other (See Comments)    Loss of appetite, laryngitis    Prozac [Fluoxetine Hcl] Other (See Comments)    Jumpy   Sulfa Antibiotics Other (See Comments)    Unknown  reaction    PAST MEDICAL HISTORY: Past Medical History:  Diagnosis Date   Adenomatous polyp of colon 2020   Allergy    Anxiety    Basal cell carcinoma (BCC)    Cataract    Depression    Helicobacter pylori gastritis    Osteopenia    Pneumonia    hx of 03/2008   Stroke [facility])    TIA long time ago    PAST SURGICAL HISTORY: Past Surgical History:  Procedure Laterality Date   APPENDECTOMY     BREAST LUMPECTOMY     right   CATARACT EXTRACTION W/ INTRAOCULAR LENS  IMPLANT, BILATERAL Bilateral    CESAREAN SECTION     CHOLECYSTECTOMY     MOHS SURGERY     PARTIAL HYSTERECTOMY     ROTATOR CUFF REPAIR  2012   SKIN GRAFT     teeth remmoved      FAMILY HISTORY: The patient's family history includes Cancer in her brother and sister; Colon cancer in her son; Coronary artery disease in her brother; Dementia in her brother; Diabetes in her sister; Stomach cancer in her paternal grandmother.   SOCIAL HISTORY:  The patient  reports that she has never smoked. She has never used smokeless tobacco. She reports that she does not drink alcohol and does not use drugs.  MEDICATIONS: Current Outpatient Medications  Medication Instructions   Ascorbic Acid (VITAMIN C PO) 1 tablet, Oral, Daily   Polyethyl Glycol-Propyl Glycol (SYSTANE OP) 1-2 drops, Both Eyes, Daily PRN   Vitamin D3 2,000 Units, Oral, Daily    REVIEW OF SYSTEMS: Review of Systems  Constitutional: Positive for chills.  Cardiovascular:  Negative for chest pain, claudication, dyspnea  on exertion, irregular heartbeat, leg swelling, near-syncope, orthopnea, palpitations, paroxysmal nocturnal dyspnea and syncope.  Respiratory:  Negative for shortness of breath.   Hematologic/Lymphatic: Negative for bleeding problem.  Musculoskeletal:  Negative for muscle cramps and myalgias.  Gastrointestinal:  Positive for bloating, abdominal pain and diarrhea.  Neurological:  Positive for dizziness and light-headedness.  All other systems  reviewed and are negative.   PHYSICAL EXAM:    12/14/2021   11:53 AM 12/14/2021   11:52 AM 12/14/2021    9:11 AM  Vitals with BMI  Systolic 009 98 381  Diastolic 68 70 92  Pulse 74 76      Intake/Output Summary (Last 24 hours) at 12/14/2021 1256 Last data filed at 12/13/2021 2346 Gross per 24 hour  Intake 1616.9 ml  Output --  Net 1616.9 ml    Net IO Since Admission: 2,733.04 mL [12/14/21 1256]  CONSTITUTIONAL: Appears older than stated age, mild distress, temporal wasting, hemodynamically stable. SKIN: Skin is warm and dry. No rash noted. No cyanosis. No pallor. No jaundice HEAD: Normocephalic and atraumatic.  EYES: No scleral icterus MOUTH/THROAT: Moist oral membranes.  NECK: No JVD present. No thyromegaly noted. No carotid bruits  CHEST Normal respiratory effort. No intercostal retractions  LUNGS: Clear to auscultation bilaterally.  No stridor. No wheezes. No rales.  CARDIOVASCULAR: Tachycardic, irregularly irregular, variable W2-X9, soft holosystolic murmur at the left lower sternal border, no gallops or rubs.   ABDOMINAL: Soft, nontender, nondistended, positive bowel sounds in all 4 quadrants, no apparent ascites.  EXTREMITIES: No pitting edema, warm to touch.  HEMATOLOGIC: No significant bruising NEUROLOGIC: Oriented to person, place, and time. Nonfocal. Normal muscle tone.  PSYCHIATRIC: Normal mood and affect. Normal behavior. Cooperative  RADIOLOGY: CT Abdomen Pelvis W Contrast  Result Date: 12/12/2021 CLINICAL DATA:  Diarrhea and fever for 1 day.  86 year old female. EXAM: CT ABDOMEN AND PELVIS WITH CONTRAST TECHNIQUE: Multidetector CT imaging of the abdomen and pelvis was performed using the standard protocol following bolus administration of intravenous contrast. RADIATION DOSE REDUCTION: This exam was performed according to the departmental dose-optimization program which includes automated exposure control, adjustment of the mA and/or kV according to patient size  and/or use of iterative reconstruction technique. CONTRAST:  91m OMNIPAQUE IOHEXOL 300 MG/ML  SOLN COMPARISON:  None Available. FINDINGS: Lower chest: Lung bases are clear. Hepatobiliary: No focal hepatic lesion. No biliary duct dilatation. Common bile duct is normal. Pancreas: Pancreas is normal. No ductal dilatation. No pancreatic inflammation. Spleen: Normal spleen Adrenals/urinary tract: Adrenal glands normal. Simple fluid attenuation cyst in the LEFT kidney measures 15 mm. Ureters and bladder normal. Stomach/Bowel: The stomach, duodenum, and small bowel normal. Ascending, transverse and descending colon normal. No bowel obstruction. Mild submucosal edema and mucosal enhancement of the rectosigmoid colon. (Coronal image 28/6 and axial image 64/2). Vascular/Lymphatic: Abdominal aorta is normal caliber with atherosclerotic calcification. There is no retroperitoneal or periportal lymphadenopathy. No pelvic lymphadenopathy. Reproductive: Post hysterectomy.  Adnexa unremarkable Other: No free fluid. Musculoskeletal: No aggressive osseous lesion. IMPRESSION: 1. Submucosal edema and mucosal enhancement through the rectum. Differential includes mild proctitis versus segmental colitis (inflammatory bowel disease, infectious colitis, or unlikely ischemia colitis). 2. No evidence of bowel obstruction. 3. No additional abnormality of the bowel. 4. 1.5 cm left Bosniak 1 benign simple cyst. No follow-up imaging is recommended. JACR 2018 Feb; 264-273, Management of the Incidental RenalMass on CT, [employer] 2021; 814-848, Bosniak Classification of Cystic Renal Masses, Version 2019. 5. Electronically Signed   By: SHelane GuntherD.  On: 12/12/2021 17:57    LABORATORY DATA: Lab Results  Component Value Date   WBC 5.5 12/14/2021   HGB 13.4 12/14/2021   HCT 39.2 12/14/2021   MCV 96.1 12/14/2021   PLT 155 12/14/2021    Recent Labs  Lab 12/14/21 0701  NA 134*  K 3.2*  CL 100  CO2 25  BUN 9  CREATININE 0.52   CALCIUM 9.0  PROT 6.3*  BILITOT 0.8  ALKPHOS 76  ALT 42  AST 83*  GLUCOSE 94    Lipid Panel  Lab Results  Component Value Date   CHOL 220 (H) 04/05/2021   HDL 79.50 04/05/2021   LDLCALC 127 (H) 04/05/2021   LDLDIRECT 145.7 05/12/2013   TRIG 65.0 04/05/2021   CHOLHDL 3 04/05/2021    BNP (last 3 results) No results for input(s): "BNP" in the last 8760 hours.  HEMOGLOBIN A1C No results found for: "HGBA1C", "MPG"  Cardiac Panel (last 3 results) No results for input(s): "CKTOTAL", "CKMB", "TROPONINIHS", "RELINDX" in the last 72 hours.   TSH Recent Labs    04/05/21 1236 11/14/21 1042  TSH 3.26 3.37     CARDIAC DATABASE: EKG: 12/13/2021: Normal sinus rhythm, 74 bpm, nonspecific ST-T changes, equal and PACs.  12/14/2021: Atrial fibrillation with rapid ventricular rate, 146 bpm, nonspecific ST-T changes  Echocardiogram: 12/14/2021:  1. Left ventricular ejection fraction, by estimation, is 55 to 60%. The  left ventricle has normal function. The left ventricle has no regional  wall motion abnormalities. Left ventricular diastolic function could not  be evaluated.   2. Right ventricular systolic function is low normal. The right  ventricular size is normal. There is normal pulmonary artery systolic  pressure. The estimated right ventricular systolic pressure is 27.7 mmHg.   3. The mitral valve is degenerative. Mild mitral valve regurgitation. No  evidence of mitral stenosis.   4. The aortic valve is grossly normal. Aortic valve regurgitation is not  visualized. Aortic valve sclerosis/calcification is present, without any  evidence of aortic stenosis.   5. The inferior vena cava is normal in size with greater than 50%  respiratory variability, suggesting right atrial pressure of 3 mmHg.   6. Rhythm strip during this exam demonstrates Afib RVR.   Calcium Score:  04/11/2018 Coronary calcium score of 16. This was 69 rd percentile for age and sex matched  control.  IMPRESSION & RECOMMENDATIONS: Julie Montgomery is a 86 y.o. Caucasian female whose past medical history and cardiovascular risk factors include: Mild coronary artery calcification, history of C. difficile infection (2016 and 2018), Aortic Atherosclerosis (CT 03/2019), basal cell carcinoma, bronchiectasis, history of TIA.  Impression:  New onset atrial fibrillation with rapid ventricular rate. C. difficile colitis Intractable diarrhea Hyponatremia Hypokalemia History of TIA. Aortic atherosclerosis. Mild coronary artery calcification  Plan:  New onset atrial fibrillation with rapid ventricular rate: Rate control: We will start metoprolol 25 mg p.o. 3 times daily with holding parameters. Rhythm control: IV amiodarone Thromboembolic prophylaxis: IV heparin pharmacy dosing CHA2DS2-VASc SCORE is 5 which correlates to 6.7% risk of stroke per year (age, history of TIA, gender).  Onset 12/14/2021 likely due to intravascular depletion due to intractable diarrhea, multiple electrolyte abnormalities including hypokalemia. Recommend treating the underlying cause and providing supportive care in the interim. If she remains in A-fib despite correcting the underlying cause we will discuss undergoing TEE guided cardioversion prior to discharge. Continue telemetry We will check TSH. For rate control strategy and started her on metoprolol.  However, there is concerns  for malabsorption due to frequent diarrhea may consider transition to IV Cardizem if needed.  C. difficile colitis/intractable diarrhea: Vancomycin transition to dificid. Recommend IV hydration with 0.9 normal saline along with supplementation of potassium to address her dehydration.  Will defer to primary team  Hypokalemia/hyponatremia: Likely due to intractable diarrhea and dehydration. Currently being repleted by primary team.  Mild coronary artery calcification/aortic atherosclerosis: We will check lipid panel.  Plan of  care discussed with the patient, husband, and son at bedside.  Nursing staff has also been updated care post rounding.   Total encounter time 60 minutes. *Total Encounter Time as defined by the Centers for Medicare and Medicaid Services includes, in addition to the face-to-face time of a patient visit (documented in the note above) non-face-to-face time: obtaining and reviewing outside history, ordering and reviewing medications, tests or procedures, care coordination (communications with other health care professionals or caregivers) and documentation in the medical record.  [name] questions and concerns were addressed to her satisfaction. She voices understanding of the instructions provided during this encounter.   This note was created using a voice recognition software as a result there may be grammatical errors inadvertently enclosed that do not reflect the nature of this encounter. Every attempt is made to correct such errors.  Mechele Claude [facility]  Pager: [phone] Office: [phone] 12/14/2021, 12:56 PM

## 2021-12-14 NOTE — Progress Notes (Signed)
.   Transition of Care [facility]) Screening Note   Patient Details  Name: Julie Montgomery Date of Birth: 1935-11-27   Transition of Care [facility]) CM/SW Contact:    Illene Regulus, LCSW Phone Number: 12/14/2021, 11:14 AM    Transition of Care Department [facility]) has reviewed patient and no TOC needs have been identified at this time. We will continue to monitor patient advancement through interdisciplinary progression rounds. If new patient transition needs arise, please place a TOC consult.   Arlie Solomons.Kiaraliz Rafuse, MSW, [place]  Transitions of Care Clinical Social Worker I Direct Dial: [phone]  Fax: [phone] [email or web address]

## 2021-12-14 NOTE — Progress Notes (Signed)
PROGRESS NOTE  Julie Montgomery JKD:[id number] DOB: 12/22/1935 DOA: 12/12/2021 PCP: Cassandria Anger, MD   LOS: 0 days   Brief Narrative / Interim history: 86 year old female with history of prior C. difficile infections, basal cell carcinoma, bronchiectasis who comes into the hospital with diarrhea and weakness.  She saw her dermatologist about 3 weeks ago, had a precancerous lesion removed, and following that she was placed on Keflex due to developing redness and warmth around the removed lesion.  She finished Keflex, and the next day on 8/14 started developing profuse watery diarrhea 20+ stools a day.  She came to the hospital, was found to be dehydrated and stool studies were positive for C. difficile.  She was placed on p.o. vancomycin  Subjective / 24h Interval events: Continues to complain of significant profuse watery diarrhea.  10+ BMs yesterday, at least 3 this morning.  Also developed A-fib with RVR  Assesement and Plan: Principal Problem:   Clostridium difficile colitis Active Problems:   Dehydration with hyponatremia   Protein-calorie malnutrition, severe ([place])  Principal problem C. difficile colitis, diarrhea-still having ongoing loose stools.  She was placed on fidaxomicin, pharmacy was able to have that for patient to have home.  Continues to require IV fluids as she has profuse diarrhea and clinically remains dehydrated.  Active problems A-fib with RVR-new onset this morning, rates 180s-190s.  This is new, no history of A-fib, no prior cardiology evaluation.  Consulted cardiology, discussed with Dr. Terri Skains, he will see in consultation.  She was given metoprolol with no significant rate improvement.  She was in sinus yesterday, so A-fib is fairly new, start amiodarone infusion.  2D echo has been ordered and pending.  Transfer to progressive, also bolus another liter  Hyponatremia-mild, due to dehydration.  Hypokalemia-continue to replete potassium, this is due to her  diarrhea.  Magnesium normal at 1.8  Scalp lesion-status post removal by dermatology 3 weeks prior, completed a course of Keflex for concern for surrounding cellulitis.  Stable now  Scheduled Meds:  amiodarone  150 mg Intravenous Once   enoxaparin (LOVENOX) injection  30 mg Subcutaneous Q24H   fidaxomicin  200 mg Oral BID   Continuous Infusions:  amiodarone     Followed by   amiodarone     PRN Meds:.acetaminophen **OR** acetaminophen, oxyCODONE-acetaminophen **OR** morphine injection, ondansetron **OR** ondansetron (ZOFRAN) IV  Diet Orders (From admission, onward)     Start     Ordered   12/13/21 0308  Diet clear liquid Room service appropriate? Yes; Fluid consistency: Thin  Diet effective now       Question Answer Comment  Room service appropriate? Yes   Fluid consistency: Thin      12/13/21 0307            DVT prophylaxis: enoxaparin (LOVENOX) injection 30 mg Start: 12/13/21 1000   Lab Results  Component Value Date   PLT 155 12/14/2021      Code Status: Full Code  Family Communication: Husband and son at bedside  Status is: Observation  The patient will require care spanning > 2 midnights and should be moved to inpatient because: New onset A-fib with RVR, upgrade level of care to progressive  Level of care: Progressive  Consultants:  Cardiology   Objective: Vitals:   12/13/21 1429 12/13/21 2156 12/14/21 0506 12/14/21 0911  BP: (!) 152/65 (!) 145/72 118/63 (!) 147/92  Pulse: 81 85 78   Resp: 20  20   Temp: 98.2 F (36.8 C) 98.8 F (37.1  C) 98.7 F (37.1 C)   TempSrc: Oral Oral Oral   SpO2: 98% 98% 100%   Weight:      Height:        Intake/Output Summary (Last 24 hours) at 12/14/2021 1110 Last data filed at 12/13/2021 2346 Gross per 24 hour  Intake 1616.9 ml  Output --  Net 1616.9 ml   Wt Readings from Last 3 Encounters:  12/13/21 41.2 kg  11/29/21 41.7 kg  11/14/21 40.5 kg    Examination:  Constitutional: NAD Eyes: no scleral  icterus ENMT: Mucous membranes are moist.  Neck: normal, supple Respiratory: Diminished at the bases, faint end expiratory wheezing Cardiovascular: Irregular irregular, tachycardic Abdomen: non distended, no tenderness. Bowel sounds positive.  Musculoskeletal: no clubbing / cyanosis.  Skin: no rashes Neurologic: non focal   Data Reviewed: I have independently reviewed following labs and imaging studies   CBC Recent Labs  Lab 12/12/21 1450 12/13/21 0842 12/14/21 0701  WBC 8.3 7.5 5.5  HGB 13.9 11.9* 13.4  HCT 40.7 35.4* 39.2  PLT 181 137* 155  MCV 93.8 96.5 96.1  MCH 32.0 32.4 32.8  MCHC 34.2 33.6 34.2  RDW 12.5 12.7 12.4  LYMPHSABS  --  0.3* 0.7  MONOABS  --  0.5 0.8  EOSABS  --  0.0 0.0  BASOSABS  --  0.0 0.0    Recent Labs  Lab 12/12/21 1450 12/13/21 0842 12/14/21 0701  NA 133* 137 134*  K 3.7 3.5 3.2*  CL 100 105 100  CO2 '25 24 25  '$ GLUCOSE 98 119* 94  BUN '19 18 9  '$ CREATININE 0.62 0.63 0.52  CALCIUM 9.3 8.6* 9.0  AST 22 34 83*  ALT 17 29 42  ALKPHOS 96 72 76  BILITOT 0.6 0.6 0.8  ALBUMIN 4.2 3.1* 3.4*  MG  --  1.8 1.8    ------------------------------------------------------------------------------------------------------------------ No results for input(s): "CHOL", "HDL", "LDLCALC", "TRIG", "CHOLHDL", "LDLDIRECT" in the last 72 hours.  No results found for: "HGBA1C" ------------------------------------------------------------------------------------------------------------------ No results for input(s): "TSH", "T4TOTAL", "T3FREE", "THYROIDAB" in the last 72 hours.  Invalid input(s): "FREET3"  Cardiac Enzymes No results for input(s): "CKMB", "TROPONINI", "MYOGLOBIN" in the last 168 hours.  Invalid input(s): "CK" ------------------------------------------------------------------------------------------------------------------ No results found for: "BNP"  CBG: No results for input(s): "GLUCAP" in the last 168 hours.  Recent Results (from the  past 240 hour(s))  C Difficile Quick Screen w PCR reflex     Status: Abnormal   Collection Time: 12/12/21  5:11 PM   Specimen: STOOL  Result Value Ref Range Status   C Diff antigen POSITIVE (A) NEGATIVE Final   C Diff toxin NEGATIVE NEGATIVE Final   C Diff interpretation Results are indeterminate. See PCR results.  Final    Comment: Performed at [facility], [place] [address] [place], [place] [address]  C. Diff by PCR, Reflexed     Status: Abnormal   Collection Time: 12/12/21  5:11 PM  Result Value Ref Range Status   Toxigenic C. Difficile by PCR POSITIVE (A) NEGATIVE Final    Comment: Positive for toxigenic C. difficile with little to no toxin production. Only treat if clinical presentation suggests symptomatic illness. CRITICAL RESULT CALLED TO, READ BACK BY AND VERIFIED WITH:  C/ ANDREW P., RN 12/13/21 0030 A. LAFRANCE  Performed at [facility], [place] [address] [place], [place] [address]   Gastrointestinal Panel by PCR , Stool     Status: None   Collection Time: 12/12/21  5:45 PM  Result Value Ref Range Status   Campylobacter species NOT DETECTED NOT DETECTED Final   Plesimonas shigelloides NOT DETECTED NOT DETECTED Final   Salmonella species NOT DETECTED NOT DETECTED Final   Yersinia enterocolitica NOT DETECTED NOT DETECTED Final   Vibrio species NOT DETECTED NOT DETECTED Final   Vibrio cholerae NOT DETECTED NOT DETECTED Final   Enteroaggregative E coli (EAEC) NOT DETECTED NOT DETECTED Final   Enteropathogenic E coli (EPEC) NOT DETECTED NOT DETECTED Final   Enterotoxigenic E coli (ETEC) NOT DETECTED NOT DETECTED Final   Shiga like toxin producing E coli (STEC) NOT DETECTED NOT DETECTED Final   Shigella/Enteroinvasive E coli (EIEC) NOT DETECTED NOT DETECTED Final   Cryptosporidium NOT DETECTED NOT DETECTED Final   Cyclospora cayetanensis NOT DETECTED NOT DETECTED Final   Entamoeba histolytica NOT DETECTED NOT DETECTED Final   Giardia lamblia NOT DETECTED NOT DETECTED  Final   Adenovirus F40/41 NOT DETECTED NOT DETECTED Final   Astrovirus NOT DETECTED NOT DETECTED Final   Norovirus GI/GII NOT DETECTED NOT DETECTED Final   Rotavirus A NOT DETECTED NOT DETECTED Final   Sapovirus (I, II, IV, and V) NOT DETECTED NOT DETECTED Final    Comment: Performed at [facility], [address], [place], [place] [address]     Radiology Studies: No results found.   Marzetta Board, MD, PhD [facility]  Between 7 am - 7 pm I am available, please contact me via Amion (for emergencies) or Securechat (non urgent messages)  Between 7 pm - 7 am I am not available, please contact night coverage MD/APP via Amion

## 2021-12-14 NOTE — Progress Notes (Signed)
Initial Nutrition Assessment  DOCUMENTATION CODES:   Underweight  INTERVENTION:  Encourage adequate PO intake MD agrees to advance diet from clear liquid to regular diet Boost Plus po BID, each supplement provides 360 kcal and 14 grams of protein MVI with minerals daily Spoke with MD for recommendation of addition of probiotic during admission  NUTRITION DIAGNOSIS:   Increased nutrient needs related to acute illness, diarrhea as evidenced by estimated needs.  GOAL:   Patient will meet greater than or equal to 90% of their needs  MONITOR:   PO intake, Supplement acceptance, Diet advancement, Labs, Weight trends, I & O's  REASON FOR ASSESSMENT:   Consult Assessment of nutrition requirement/status, Poor PO  ASSESSMENT:   Pt admitted with c. diff colitis.PMH significant for prior C. Diff infections, basal cell carcinoma, bronchiectasis.   During admission has developed afib with RVR, remains inpatient d/t need for amiodarone.  No documented meal completions on file.   Spoke with pt at bedside.She recently had a lesion removed by her dermatologist and was started on a medication, the last day of the medication she woke up with diarrhea and has continued since multiple times per day. Only since admission, d/t being on a clear liquid diet, she has had good PO intake at her baseline. She also recalls drinking 1 Boost Plus daily at home. She is agreeable to receiving them during admission. At home she typically takes a probiotic. Spoke with MD who states that we can upgrade her diet to a regular and will also add probiotic during admission.  Pt's husband and son present at time of visit. Her son brought chicken soup and some sides as well for her to have. She is pleased about being able to eat again.   Pt states that 7 years ago she had an episode of c.diff. At that time she had lost weight down from low 100s to ~85 lbs. She was able to regain some of that weight but has since had  gradual weight loss d/t other health concerns. Reviewed weight history on file. It appears that since October 2022, her weight has been slowly increasing. Current admit weight noted to be 41.2 kg.   Unable to confirm at this time, however suspect patient may have a degree of malnutrition present. Will re-assess once able to perform nutrition focused physical exam.   Medications reviewed  Labs: sodium 134, potassium 3.2 (L-replaced)  I/O's: +272m since admission  NUTRITION - FOCUSED PHYSICAL EXAM: Deferred to follow up.   Diet Order:   Diet Order             Diet regular Room service appropriate? Yes; Fluid consistency: Thin  Diet effective now                   EDUCATION NEEDS:   Education needs have been addressed  Skin:  Skin Assessment: Reviewed RN Assessment  Last BM:  8/17 (type 7; >5x)  Height:   Ht Readings from Last 1 Encounters:  12/13/21 '5\' 2"'$  (1.575 m)    Weight:   Wt Readings from Last 1 Encounters:  12/13/21 41.2 kg   BMI:  Body mass index is 16.61 kg/m.  Estimated Nutritional Needs:   Kcal:  1300-1500  Protein:  60-70g  Fluid:  >/=1.5L  AClayborne Dana RDN, LDN Clinical Nutrition

## 2021-12-14 NOTE — Plan of Care (Signed)

## 2021-12-14 NOTE — Plan of Care (Signed)
Pt continues to have profuse diarrhea.  Pt reporting no pain at this time.  Problem: Nutrition: Goal: Adequate nutrition will be maintained Outcome: Not Progressing   Problem: Elimination: Goal: Will not experience complications related to bowel motility Outcome: Not Progressing

## 2021-12-14 NOTE — Progress Notes (Signed)
ANTICOAGULATION CONSULT NOTE - Initial Consult  Pharmacy Consult for Heparin Indication: atrial fibrillation  Allergies  Allergen Reactions   Lovastatin Other (See Comments)    Unknown reaction   Mometasone Furo-Formoterol Fum Other (See Comments)    Loss of appetite, laryngitis    Prozac [Fluoxetine Hcl] Other (See Comments)    Jumpy   Sulfa Antibiotics Other (See Comments)    Unknown reaction    Patient Measurements: Height: '5\' 2"'$  (157.5 cm) Weight: 41.2 kg (90 lb 13.3 oz) IBW/kg (Calculated) : 50.1 Heparin Dosing Weight: 41.2 kg  Vital Signs: Temp: 97.9 F (36.6 C) (08/17 1152) Temp Source: Oral (08/17 1152) BP: 109/68 (08/17 1153) Pulse Rate: 74 (08/17 1153)  Labs: Recent Labs    12/12/21 1450 12/13/21 0842 12/14/21 0701  HGB 13.9 11.9* 13.4  HCT 40.7 35.4* 39.2  PLT 181 137* 155  CREATININE 0.62 0.63 0.52    Estimated Creatinine Clearance: 34.7 mL/min (by C-G formula based on SCr of 0.52 mg/dL).   Medical History: Past Medical History:  Diagnosis Date   Adenomatous polyp of colon 2020   Allergy    Anxiety    Basal cell carcinoma (BCC)    Cataract    Depression    Helicobacter pylori gastritis    Osteopenia    Pneumonia    hx of 03/2008   Stroke [facility])    TIA long time ago    Assessment: AC/Heme: IV heparin for new afib. No anticoag PTA. Hgb 13.4. Plts 155  Goal of Therapy:  Heparin level 0.3-0.7 units/ml Monitor platelets by anticoagulation protocol: Yes   Plan:  Last dose LMWH 8/17 1000), only 41kg Start IV heparin (no bolus due to recent LMWH) at 550 units/hr Check heparin level in 8 hrs. Daily HL and CBC   Julie Montgomery S. Alford Highland, PharmD, BCPS Clinical Staff Pharmacist [email or web address]  Julie Montgomery 12/14/2021,4:26 PM

## 2021-12-15 ENCOUNTER — Other Ambulatory Visit (HOSPITAL_COMMUNITY): Payer: Self-pay

## 2021-12-15 ENCOUNTER — Inpatient Hospital Stay (HOSPITAL_COMMUNITY): Payer: Medicare Other

## 2021-12-15 DIAGNOSIS — A0472 Enterocolitis due to Clostridium difficile, not specified as recurrent: Secondary | ICD-10-CM | POA: Diagnosis not present

## 2021-12-15 LAB — COMPREHENSIVE METABOLIC PANEL
ALT: 250 U/L — ABNORMAL HIGH (ref 0–44)
AST: 256 U/L — ABNORMAL HIGH (ref 15–41)
Albumin: 3 g/dL — ABNORMAL LOW (ref 3.5–5.0)
Alkaline Phosphatase: 242 U/L — ABNORMAL HIGH (ref 38–126)
Anion gap: 9 (ref 5–15)
BUN: 20 mg/dL (ref 8–23)
CO2: 21 mmol/L — ABNORMAL LOW (ref 22–32)
Calcium: 9.1 mg/dL (ref 8.9–10.3)
Chloride: 103 mmol/L (ref 98–111)
Creatinine, Ser: 0.61 mg/dL (ref 0.44–1.00)
GFR, Estimated: >60 mL/min (ref >60)
Glucose, Bld: 115 mg/dL — ABNORMAL HIGH (ref 70–99)
Potassium: 3.4 mmol/L — ABNORMAL LOW (ref 3.5–5.1)
Sodium: 133 mmol/L — ABNORMAL LOW (ref 135–145)
Total Bilirubin: 0.6 mg/dL (ref 0.3–1.2)
Total Protein: 5.3 g/dL — ABNORMAL LOW (ref 6.5–8.1)

## 2021-12-15 LAB — CBC
HCT: 36.4 % (ref 36.0–46.0)
Hemoglobin: 12.2 g/dL (ref 12.0–15.0)
MCH: 31.9 pg (ref 26.0–34.0)
MCHC: 33.5 g/dL (ref 30.0–36.0)
MCV: 95 fL (ref 80.0–100.0)
Platelets: 178 10*3/uL (ref 150–400)
RBC: 3.83 MIL/uL — ABNORMAL LOW (ref 3.87–5.11)
RDW: 12.5 % (ref 11.5–15.5)
WBC: 6.5 10*3/uL (ref 4.0–10.5)
nRBC: 0 % (ref 0.0–0.2)

## 2021-12-15 LAB — HEPARIN LEVEL (UNFRACTIONATED)
Heparin Unfractionated: 0.25 IU/mL — ABNORMAL LOW (ref 0.30–0.70)
Heparin Unfractionated: 0.16 IU/mL — ABNORMAL LOW (ref 0.30–0.70)

## 2021-12-15 LAB — LIPID PANEL
Cholesterol: 156 mg/dL (ref 0–200)
HDL: 53 mg/dL (ref >40)
LDL Cholesterol: 86 mg/dL (ref 0–99)
Total CHOL/HDL Ratio: 2.9 RATIO
Triglycerides: 84 mg/dL (ref <150)
VLDL: 17 mg/dL (ref 0–40)

## 2021-12-15 LAB — LDL CHOLESTEROL, DIRECT: Direct LDL: 82 mg/dL (ref 0–99)

## 2021-12-15 MED ORDER — APIXABAN 2.5 MG PO TABS
2.5000 mg | ORAL_TABLET | Freq: Two times a day (BID) | ORAL | Status: DC
  Administered 2021-12-15 – 2021-12-18 (×6): 2.5 mg via ORAL
  Filled 2021-12-15 (×6): qty 1

## 2021-12-15 MED ORDER — FIDAXOMICIN 200 MG PO TABS
200.0000 mg | ORAL_TABLET | Freq: Two times a day (BID) | ORAL | 0 refills | Status: DC
  Filled 2021-12-15: qty 20, 10d supply, fill #0

## 2021-12-15 MED ORDER — SODIUM CHLORIDE 0.9 % IV SOLN: INTRAVENOUS | Status: DC

## 2021-12-15 MED ORDER — METOPROLOL TARTRATE 25 MG PO TABS
25.0000 mg | ORAL_TABLET | Freq: Two times a day (BID) | ORAL | Status: DC
  Administered 2021-12-16 – 2021-12-18 (×5): 25 mg via ORAL
  Filled 2021-12-15 (×6): qty 1

## 2021-12-15 MED ORDER — POTASSIUM CHLORIDE CRYS ER 20 MEQ PO TBCR
40.0000 meq | EXTENDED_RELEASE_TABLET | Freq: Once | ORAL | Status: AC
  Administered 2021-12-15: 40 meq via ORAL
  Filled 2021-12-15: qty 2

## 2021-12-15 NOTE — Progress Notes (Signed)
Progress Note  Patient Name: Julie Montgomery Date of Encounter: 12/15/2021  Attending physician: Caren Griffins, MD Primary care provider: Plotnikov, Evie Lacks, MD  Subjective: Julie Montgomery is a 86 y.o. female who was seen and examined at bedside  Denies anginal discomfort or heart failure symptoms. Patient states that she feels much better compared to yesterday. Patient converted to sinus rhythm earlier this morning between 2-3 AM. Her frequency of diarrhea is also improved.   Husband present at bedside Case discussed and reviewed with her nurse.  Objective: Vital Signs in the last 24 hours: Temp:  [97.5 F (36.4 C)-98.2 F (36.8 C)] 98 F (36.7 C) (08/18 0518) Pulse Rate:  [45-93] 49 (08/18 1023) Resp:  [13-24] 15 (08/18 0722) BP: (83-139)/(42-95) 130/65 (08/18 1435) SpO2:  [93 %-97 %] 94 % (08/18 0518)  Intake/Output:  Intake/Output Summary (Last 24 hours) at 12/15/2021 1602 Last data filed at 12/15/2021 1300 Gross per 24 hour  Intake 603.45 ml  Output --  Net 603.45 ml    Net IO Since Admission: 3,336.49 mL [12/15/21 1602]  Weights:  Filed Weights   12/12/21 1443 12/13/21 0259  Weight: 39 kg 41.2 kg    Telemetry: Personally reviewed.  Sinus bradycardia   Converted to normal sinus rhythm  Physical examination: PHYSICAL EXAM:    12/15/2021    2:35 PM 12/15/2021   10:23 AM 12/15/2021    7:22 AM  Vitals with BMI  Systolic 097  353  Diastolic 65  50  Pulse  49 47    CONSTITUTIONAL: Appears older than stated age, mild distress, temporal wasting, hemodynamically stable SKIN: Skin is warm and dry. No rash noted. No cyanosis. No pallor. No jaundice HEAD: Normocephalic and atraumatic.  EYES: No scleral icterus MOUTH/THROAT: Moist oral membranes.  NECK: No JVD present. No thyromegaly noted. No carotid bruits  LYMPHATIC: No visible cervical adenopathy.  CHEST Normal respiratory effort. No intercostal retractions  LUNGS: Clear to auscultation bilaterally.   No stridor. No wheezes. No rales. CARDIOVASCULAR: Regular, bradycardic, soft holosystolic murmur heard at the left lower sternal border, no gallops or rubs. ABDOMINAL: Soft, nontender, nondistended, positive bowel sounds in all 4 quadrants, no apparent ascites.  EXTREMITIES: No pitting edema, warm to touch. HEMATOLOGIC: No significant bruising NEUROLOGIC: Oriented to person, place, and time. Nonfocal. Normal muscle tone.  PSYCHIATRIC: Normal mood and affect. Normal behavior. Cooperative  Lab Results: Hematology Recent Labs  Lab 12/13/21 0842 12/14/21 0701 12/15/21 0056  WBC 7.5 5.5 6.5  RBC 3.67* 4.08 3.83*  HGB 11.9* 13.4 12.2  HCT 35.4* 39.2 36.4  MCV 96.5 96.1 95.0  MCH 32.4 32.8 31.9  MCHC 33.6 34.2 33.5  RDW 12.7 12.4 12.5  PLT 137* 155 178    Chemistry Recent Labs  Lab 12/13/21 0842 12/14/21 0701 12/15/21 0056  NA 137 134* 133*  K 3.5 3.2* 3.4*  CL 105 100 103  CO2 24 25 21*  GLUCOSE 119* 94 115*  BUN '18 9 20  '$ CREATININE 0.63 0.52 0.61  CALCIUM 8.6* 9.0 9.1  PROT 5.8* 6.3* 5.3*  ALBUMIN 3.1* 3.4* 3.0*  AST 34 83* 256*  ALT 29 42 250*  ALKPHOS 72 76 242*  BILITOT 0.6 0.8 0.6  GFRNONAA >60 >60 >60  ANIONGAP '8 9 9     '$ Cardiac Enzymes: Cardiac Panel (last 3 results) No results for input(s): "CKTOTAL", "CKMB", "TROPONINIHS", "RELINDX" in the last 72 hours.  BNP (last 3 results) No results for input(s): "BNP" in the last 8760 hours.  ProBNP (last 3 results) No results for input(s): "PROBNP" in the last 8760 hours.   DDimer No results for input(s): "DDIMER" in the last 168 hours.   Hemoglobin A1c: No results found for: "HGBA1C", "MPG"  TSH  Recent Labs    04/05/21 1236 11/14/21 1042 12/14/21 1700  TSH 3.26 3.37 6.357*    Lipid Panel  Lab Results  Component Value Date   CHOL 156 12/15/2021   HDL 53 12/15/2021   LDLCALC 86 12/15/2021   LDLDIRECT 82 12/15/2021   TRIG 84 12/15/2021   CHOLHDL 2.9 12/15/2021   Imaging: US Abdomen Limited  RUQ (LIVER/GB)  Result Date: 12/15/2021 CLINICAL DATA:  Elevated LFTs. EXAM: ULTRASOUND ABDOMEN LIMITED RIGHT UPPER QUADRANT COMPARISON:  CT abdomen/pelvis 12/12/2021 FINDINGS: Gallbladder: Prior cholecystectomy. Common bile duct: Diameter: 3 mm. Liver: No focal lesion identified. Within normal limits in parenchymal echogenicity. Portal vein is patent on color Doppler imaging with normal direction of blood flow towards the liver. Other: No free fluid. IMPRESSION: 1.  No acute findings. 2.  Prior cholecystectomy. Electronically Signed   By: Marin Olp M.D.   On: 12/15/2021 09:52   ECHOCARDIOGRAM COMPLETE  Result Date: 12/14/2021    ECHOCARDIOGRAM REPORT   Patient Name:   Julie Montgomery Date of Exam: 12/14/2021 Medical Rec #:  [id number]       Height:       62.0 in Accession #:    [id number]      Weight:       90.8 lb Date of Birth:  1935-10-25      BSA:          1.367 m Patient Age:    69 years        BP:           147/92 mmHg Patient Gender: F               HR:           123 bpm. Exam Location:  Inpatient Procedure: 2D Echo, Color Doppler and Cardiac Doppler Indications:    A-fib  History:        Patient has no prior history of Echocardiogram examinations.                 Risk Factors:Dyslipidemia.  Sonographer:    Raquel Sarna Senior RDCS Referring Phys: [place]  1. Left ventricular ejection fraction, by estimation, is 55 to 60%. The left ventricle has normal function. The left ventricle has no regional wall motion abnormalities. Left ventricular diastolic function could not be evaluated.  2. Right ventricular systolic function is low normal. The right ventricular size is normal. There is normal pulmonary artery systolic pressure. The estimated right ventricular systolic pressure is 76.1 mmHg.  3. The mitral valve is degenerative. Mild mitral valve regurgitation. No evidence of mitral stenosis.  4. The aortic valve is grossly normal. Aortic valve regurgitation is not visualized. Aortic  valve sclerosis/calcification is present, without any evidence of aortic stenosis.  5. The inferior vena cava is normal in size with greater than 50% respiratory variability, suggesting right atrial pressure of 3 mmHg.  6. Rhythm strip during this exam demonstrates Afib RVR. Comparison(s): No prior Echocardiogram. FINDINGS  Left Ventricle: Left ventricular ejection fraction, by estimation, is 55 to 60%. The left ventricle has normal function. The left ventricle has no regional wall motion abnormalities. The left ventricular internal cavity size was normal in size. There is  no left ventricular hypertrophy. Left ventricular diastolic  function could not be evaluated due to atrial fibrillation. Left ventricular diastolic function could not be evaluated. Right Ventricle: The right ventricular size is normal. No increase in right ventricular wall thickness. Right ventricular systolic function is low normal. There is normal pulmonary artery systolic pressure. The tricuspid regurgitant velocity is 2.51 m/s,  and with an assumed right atrial pressure of 3 mmHg, the estimated right ventricular systolic pressure is 66.4 mmHg. Left Atrium: Left atrial size was normal in size. Right Atrium: Right atrial size was normal in size. Pericardium: There is no evidence of pericardial effusion. Mitral Valve: The mitral valve is degenerative in appearance. There is moderate thickening of the mitral valve leaflet(s). Mildly decreased mobility of the mitral valve leaflets. Mild mitral annular calcification. Mild mitral valve regurgitation. No evidence of mitral valve stenosis. Tricuspid Valve: The tricuspid valve is grossly normal. Tricuspid valve regurgitation is mild . No evidence of tricuspid stenosis. Aortic Valve: The aortic valve is grossly normal. Aortic valve regurgitation is not visualized. Aortic valve sclerosis/calcification is present, without any evidence of aortic stenosis. Pulmonic Valve: The pulmonic valve was not well  visualized. Pulmonic valve regurgitation is trivial. No evidence of pulmonic stenosis. Aorta: The aortic root and ascending aorta are structurally normal, with no evidence of dilitation. Venous: The inferior vena cava is normal in size with greater than 50% respiratory variability, suggesting right atrial pressure of 3 mmHg. IAS/Shunts: The atrial septum is grossly normal. EKG: Rhythm strip during this exam demonstrates Afib RVR.  LEFT VENTRICLE PLAX 2D LVIDd:         3.80 cm LVIDs:         2.90 cm LV PW:         0.80 cm LV IVS:        0.70 cm LVOT diam:     1.80 cm LV SV:         31 LV SV Index:   23 LVOT Area:     2.54 cm  RIGHT VENTRICLE RV S prime:     11.10 cm/s TAPSE (M-mode): 1.4 cm LEFT ATRIUM             Index        RIGHT ATRIUM           Index LA diam:        2.90 cm 2.12 cm/m   RA Area:     13.80 cm LA Vol (A2C):   48.6 ml 35.56 ml/m  RA Volume:   33.80 ml  24.73 ml/m LA Vol (A4C):   30.2 ml 22.09 ml/m LA Biplane Vol: 38.3 ml 28.02 ml/m  AORTIC VALVE LVOT Vmax:   81.25 cm/s LVOT Vmean:  55.250 cm/s LVOT VTI:    0.122 m  AORTA Ao Root diam: 2.50 cm Ao Asc diam:  3.40 cm MR Peak grad:    73.6 mmHg    TRICUSPID VALVE MR Mean grad:    47.5 mmHg    TR Peak grad:   25.2 mmHg MR Vmax:         429.00 cm/s  TR Vmax:        251.00 cm/s MR Vmean:        321.5 cm/s MR PISA:         0.25 cm     SHUNTS MR PISA Eff ROA: 2 mm        Systemic VTI:  0.12 m MR PISA Radius:  0.20 cm      Systemic Diam: 1.80 cm Jayley Hustead  DO Electronically signed by Rex Kras DO Signature Date/Time: 12/14/2021/2:40:35 PM    Final     CARDIAC DATABASE: EKG: 12/13/2021: Normal sinus rhythm, 74 bpm, nonspecific ST-T changes, equal and PACs.   12/14/2021: Atrial fibrillation with rapid ventricular rate, 146 bpm, nonspecific ST-T changes   Echocardiogram: 12/14/2021:  1. Left ventricular ejection fraction, by estimation, is 55 to 60%. The  left ventricle has normal function. The left ventricle has no regional  wall motion  abnormalities. Left ventricular diastolic function could not  be evaluated.   2. Right ventricular systolic function is low normal. The right  ventricular size is normal. There is normal pulmonary artery systolic  pressure. The estimated right ventricular systolic pressure is 16.1 mmHg.   3. The mitral valve is degenerative. Mild mitral valve regurgitation. No  evidence of mitral stenosis.   4. The aortic valve is grossly normal. Aortic valve regurgitation is not  visualized. Aortic valve sclerosis/calcification is present, without any  evidence of aortic stenosis.   5. The inferior vena cava is normal in size with greater than 50%  respiratory variability, suggesting right atrial pressure of 3 mmHg.   6. Rhythm strip during this exam demonstrates Afib RVR.    Calcium Score:  04/11/2018 Coronary calcium score of 16. This was 35 rd percentile for age and sex matched control.  Scheduled Meds:  fidaxomicin  200 mg Oral BID   lactose free nutrition  237 mL Oral BID BM   metoprolol tartrate  25 mg Oral BID   multivitamin with minerals  1 tablet Oral Daily    Continuous Infusions:  sodium chloride 100 mL/hr at 12/15/21 1031   heparin 750 Units/hr (12/15/21 1220)    PRN Meds: acetaminophen **OR** acetaminophen, oxyCODONE-acetaminophen **OR** morphine injection, ondansetron **OR** ondansetron (ZOFRAN) IV   IMPRESSION & RECOMMENDATIONS: Julie Montgomery is a 86 y.o. Caucasian female whose past medical history and cardiac risk factors include: Paroxysmal atrial fibrillation (new onset 11/2021 during C. difficile colitis infection), mild coronary artery calcification, history of C. difficile infection (2016 and 2018), Aortic Atherosclerosis (CT 03/2019), basal cell carcinoma, bronchiectasis, history of TIA.  Impression: New onset atrial fibrillation, paroxysmal-now sinus bradycardia C. difficile colitis Intractable diarrhea - improving Hyponatremia/hypokalemia History of TIA. Aortic  atherosclerosis. Mild coronary artery calcification  Plan: New onset of atrial fibrillation, paroxysmal. Converted to sinus bradycardia 11/30/2021 between 2-3 AM. We will discontinue amiodarone due to AST ALT.  Rate control: Metoprolol. Rhythm control: N/A. Thromboembolic prophylaxis: IV heparin CHA2DS2-VASc SCORE is 5 which correlates to 6.7% risk of stroke per year (age, history of TIA, gender).  We will transition her from IV heparin to Eliquis for at least the first 4 weeks given her high CHA2DS2-VASc score.  Long-term anticoagulation will need to be discussed as outpatient.  She would benefit from either cardiac monitor versus loop recorder for A-fib detection. Given her bradycardia will reduce Lopressor to 25 mg p.o. twice daily.  May transition to Toprol-XL at time of discharge. Recommend outpatient follow-up. Continue telemetry  C. difficile colitis/intractable diarrhea: Improving. Currently on medical therapy. Recommend IV fluids with aggressive rehydration and replating electrolytes.. Will defer to primary team.  Hypokalemia/hyponatremia: Likely due to intractable diarrhea and dehydration.  Currently being addressed by primary team.  Mild coronary artery calcification/aortic atherosclerosis: Lipid profile reviewed. We will hold off on statin therapy given her transaminitis. Currently managed by primary team  Patient scheduled for outpatient visit 9/50/2023 at 1 PM.  Cardiology will follow peripherally.  Plan of care discussed  with patient, husband, nursing staff, and primary team.  [name] questions and concerns were addressed to her satisfaction. She voices understanding of the instructions provided during this encounter.   This note was created using a voice recognition software as a result there may be grammatical errors inadvertently enclosed that do not reflect the nature of this encounter. Every attempt is made to correct such errors.  Total time spent: 35  minutes  Mechele Claude [facility]  Pager: [phone] Office: [phone] 12/15/2021, 4:02 PM

## 2021-12-15 NOTE — Progress Notes (Signed)
ANTICOAGULATION CONSULT NOTE   Pharmacy Consult for Heparin Indication: atrial fibrillation  Allergies  Allergen Reactions   Lovastatin Other (See Comments)    Unknown reaction   Mometasone Furo-Formoterol Fum Other (See Comments)    Loss of appetite, laryngitis    Prozac [Fluoxetine Hcl] Other (See Comments)    Jumpy   Sulfa Antibiotics Other (See Comments)    Unknown reaction    Patient Measurements: Height: '5\' 2"'$  (157.5 cm) Weight: 41.2 kg (90 lb 13.3 oz) IBW/kg (Calculated) : 50.1 Heparin Dosing Weight: 41.2 kg  Vital Signs: Temp: 97.5 F (36.4 C) (08/17 2355) Temp Source: Oral (08/17 2355) BP: 98/62 (08/17 2355) Pulse Rate: 92 (08/17 2355)  Labs: Recent Labs    12/13/21 0842 12/14/21 0701 12/15/21 0056  HGB 11.9* 13.4 12.2  HCT 35.4* 39.2 36.4  PLT 137* 155 178  HEPARINUNFRC  --   --  0.25*  CREATININE 0.63 0.52 0.61     Estimated Creatinine Clearance: 34.7 mL/min (by C-G formula based on SCr of 0.61 mg/dL).   Medical History: Past Medical History:  Diagnosis Date   Adenomatous polyp of colon 2020   Allergy    Anxiety    Basal cell carcinoma (BCC)    Cataract    Depression    Helicobacter pylori gastritis    Osteopenia    Pneumonia    hx of 03/2008   Stroke [facility])    TIA long time ago    Assessment: 32 yoF with new onset afib. No anticoag PTA. Pharmacy consulted to start IV heparin.  Baseline CBC WNL.  12/15/2021: Initial heparin level 0.25- subtherapeutic on IV heparin 550 units/hr CBC: Hgb/pltc WNL No bleeding or infusion related concerns reported by RN  Goal of Therapy:  Heparin level 0.3-0.7 units/ml Monitor platelets by anticoagulation protocol: Yes   Plan:  Increase IV heparin to 650 units/hr Recheck heparin level in 8 hrs. Daily HL and CBC   Netta Cedars PharmD 12/15/2021,2:21 AM

## 2021-12-15 NOTE — Progress Notes (Signed)
ANTICOAGULATION CONSULT NOTE - Follow Up Consult  Pharmacy Consult for heparin>> apixaban Indication: new onset atrial fibrillation  Allergies  Allergen Reactions   Lovastatin Other (See Comments)    Unknown reaction   Mometasone Furo-Formoterol Fum Other (See Comments)    Loss of appetite, laryngitis    Prozac [Fluoxetine Hcl] Other (See Comments)    Jumpy   Sulfa Antibiotics Other (See Comments)    Unknown reaction    Patient Measurements: Height: '5\' 2"'$  (157.5 cm) Weight: 41.2 kg (90 lb 13.3 oz) IBW/kg (Calculated) : 50.1 Heparin Dosing Weight: 41 kg  Vital Signs: Temp: 98 F (36.7 C) (08/18 0518) BP: 130/65 (08/18 1435) Pulse Rate: 49 (08/18 1023)  Labs: Recent Labs    12/13/21 0842 12/14/21 0701 12/15/21 0056 12/15/21 1128  HGB 11.9* 13.4 12.2  --   HCT 35.4* 39.2 36.4  --   PLT 137* 155 178  --   HEPARINUNFRC  --   --  0.25* 0.16*  CREATININE 0.63 0.52 0.61  --      Estimated Creatinine Clearance: 34.7 mL/min (by C-G formula based on SCr of 0.61 mg/dL).   Assessment: Patient is an 86 y.o F who developed new onset afib on 84/17/23 and is currently on heparin drip.  Today, 12/15/2021: -transition from heparin drip to apixaban for Afib Age > 80 Wt < 60 SCr WNL   Plan:  DC heparin drip & labs Apixaban 2.5 mg po bid Will educate and give 30 day free card prior to discharge  Eudelia Bunch, Pharm.D 12/15/2021 4:06 PM

## 2021-12-15 NOTE — Progress Notes (Signed)
CCMD called to inform assigned nurse patient converted to Sinus Loletha Grayer from Afib. Will cont to monitor.

## 2021-12-15 NOTE — Progress Notes (Signed)
ANTICOAGULATION CONSULT NOTE - Follow Up Consult  Pharmacy Consult for heparin Indication: new onset atrial fibrillation  Allergies  Allergen Reactions   Lovastatin Other (See Comments)    Unknown reaction   Mometasone Furo-Formoterol Fum Other (See Comments)    Loss of appetite, laryngitis    Prozac [Fluoxetine Hcl] Other (See Comments)    Jumpy   Sulfa Antibiotics Other (See Comments)    Unknown reaction    Patient Measurements: Height: '5\' 2"'$  (157.5 cm) Weight: 41.2 kg (90 lb 13.3 oz) IBW/kg (Calculated) : 50.1 Heparin Dosing Weight: 41 kg  Vital Signs: Temp: 98 F (36.7 C) (08/18 0518) Temp Source: Oral (08/18 0323) BP: 102/50 (08/18 0722) Pulse Rate: 47 (08/18 0722)  Labs: Recent Labs    12/13/21 0842 12/14/21 0701 12/15/21 0056  HGB 11.9* 13.4 12.2  HCT 35.4* 39.2 36.4  PLT 137* 155 178  HEPARINUNFRC  --   --  0.25*  CREATININE 0.63 0.52 0.61    Estimated Creatinine Clearance: 34.7 mL/min (by C-G formula based on SCr of 0.61 mg/dL).   Assessment: Patient is an 86 y.o F who developed new onset afib on 12/14/21 and is currently on heparin drip.  Today, 12/15/2021: - heparin level is sub-therapeutic at 0.16. Per pt's RN, no issues with IV line and no bleeding issues noted. - cbc stable   Goal of Therapy:  Heparin level 0.3-0.7 units/ml Monitor platelets by anticoagulation protocol: Yes   Plan:  - increase heparin drip to 750 units/hr - check 8 hr heparin level - monitor for s/sx bleeding  Meleni Delahunt P 12/15/2021,10:16 AM

## 2021-12-15 NOTE — Progress Notes (Signed)
Will re-check BP and patient's response to Amio drip being off per VS frequency and PRN for YELLOW mews protocol to evaluate whether a NS bolus is warranted.    12/15/21 0331  Assess: MEWS Score  BP (!) 88/42  MAP (mmHg) (!) 57  Pulse Rate (!) 45  ECG Heart Rate (!) 43  Resp 18  SpO2 93 %  O2 Device Room Air  Assess: MEWS Score  MEWS Temp 0  MEWS Systolic 1  MEWS Pulse 1  MEWS RR 0  MEWS LOC 0  MEWS Score 2  MEWS Score Color Yellow  Assess: if the MEWS score is Yellow or Red  Were vital signs taken at a resting state? Yes  Focused Assessment Change from prior assessment (see assessment flowsheet)  Does the patient meet 2 or more of the SIRS criteria? No  MEWS guidelines implemented *See Row Information* Yes  Treat  MEWS Interventions Administered scheduled meds/treatments  Pain Scale 0-10  Pain Score 6  Pain Type Acute pain  Pain Location Abdomen  Pain Orientation Lower  Pain Frequency Constant  Pain Onset On-going  Patients Stated Pain Goal 3  Pain Intervention(s) MD notified (Comment) (NP Olena Heckle notified in tandem with HR dropping into 40s on Amio drip)  Multiple Pain Sites No  Take Vital Signs  Increase Vital Sign Frequency  Yellow: Q 2hr X 2 then Q 4hr X 2, if remains yellow, continue Q 4hrs  Escalate  MEWS: Escalate Yellow: discuss with charge nurse/RN and consider discussing with provider and RRT  Notify: Charge Nurse/RN  Name of Charge Nurse/RN Notified Chloe, RN, Trenda Moots., RN  Date Charge Nurse/RN Notified 12/15/21  Time Charge Nurse/RN Notified 0335  Notify: Provider  Provider Name/Title Gershon Cull, NP  Date Provider Notified 12/15/21  Time Provider Notified 0335  Method of Notification  (secure chat)  Notification Reason Change in status (HR 43-47, Amio drip shut off)  Provider response Evaluate remotely;No new orders  Date of Provider Response 12/15/21  Time of Provider Response [phone]  Document  Patient Outcome Stabilized after interventions   Progress note created (see row info) Yes  Assess: SIRS CRITERIA  SIRS Temperature  0  SIRS Pulse 0  SIRS Respirations  0  SIRS WBC 1  SIRS Score Sum  1

## 2021-12-15 NOTE — Progress Notes (Signed)
PROGRESS NOTE  Julie Montgomery ZOX:[id number] DOB: Sep 14, 1935 DOA: 12/12/2021 PCP: Cassandria Anger, MD   LOS: 1 day   Brief Narrative / Interim history: 86 year old female with history of prior C. difficile infections, basal cell carcinoma, bronchiectasis who comes into the hospital with diarrhea and weakness.  She saw her dermatologist about 3 weeks ago, had a precancerous lesion removed, and following that she was placed on Keflex due to developing redness and warmth around the removed lesion.  She finished Keflex, and the next day on 8/14 started developing profuse watery diarrhea 20+ stools a day.  She came to the hospital, was found to be dehydrated and stool studies were positive for C. difficile.  She was placed on p.o. vancomycin  Subjective / 24h Interval events: Diarrhea has slowed down, has not had a bowel movement this morning.  No chest pain.  Telemetry shows that has converted to sinus rhythm with rates into the 50s  Assesement and Plan: Principal Problem:   Clostridium difficile colitis Active Problems:   Dehydration with hyponatremia   Protein-calorie malnutrition, severe (HCC)   Paroxysmal atrial fibrillation with RVR (HCC)   C. difficile colitis  Principal problem C. difficile colitis, diarrhea-finally seems to be improving a little bit.  She was started on fidaxomicin 8/17.  No BMs this morning.  Remains dehydrated, continue fluids  Active problems Paroxysmal A-fib with RVR-new onset 8/17, heart rates were in the 180s-190s.  Cardiology consulted.  Due to elevated CHA2DS2-VASc score of 5, recommending anticoagulation with heparin for now and consider switching to NOAC tomorrow.  She was placed on amiodarone yesterday but overnight converted to sinus rhythm and is now bradycardic so amiodarone was discontinued.  She is now on metoprolol 25 3 times daily.  Echocardiogram showed EF 55-60%, no WMA, RV was low normal.  Elevated LFTs-possibly transient poor perfusion in  the setting of A-fib with RVR with rates into the 180s.  Right upper quadrant ultrasound fairly unremarkable.  Continue to monitor.  Hyponatremia-mild, due to dehydration.  Sodium 133 this morning  Hypokalemia-continue to replete potassium today  Scalp lesion-status post removal by dermatology 3 weeks prior, completed a course of Keflex for concern for surrounding cellulitis.  Stable now  Scheduled Meds:  fidaxomicin  200 mg Oral BID   lactose free nutrition  237 mL Oral BID BM   metoprolol tartrate  25 mg Oral TID   multivitamin with minerals  1 tablet Oral Daily   Continuous Infusions:  sodium chloride 100 mL/hr at 12/15/21 1031   heparin 650 Units/hr (12/15/21 0600)   PRN Meds:.acetaminophen **OR** acetaminophen, oxyCODONE-acetaminophen **OR** morphine injection, ondansetron **OR** ondansetron (ZOFRAN) IV  Diet Orders (From admission, onward)     Start     Ordered   12/14/21 1429  Diet regular Room service appropriate? Yes; Fluid consistency: Thin  Diet effective now       Question Answer Comment  Room service appropriate? Yes   Fluid consistency: Thin      12/14/21 1428            DVT prophylaxis:    Lab Results  Component Value Date   PLT 178 12/15/2021      Code Status: Full Code  Family Communication: Husband and son at bedside  Status is: Inpatient  Level of care: Progressive  Consultants:  Cardiology   Objective: Vitals:   12/15/21 0600 12/15/21 0615 12/15/21 0722 12/15/21 1023  BP: (!) 83/43 (!) 83/47 (!) 102/50   Pulse:   (!) 47 Marland Kitchen)  49  Resp: '14 13 15   '$ Temp:      TempSrc:      SpO2:      Weight:      Height:        Intake/Output Summary (Last 24 hours) at 12/15/2021 1105 Last data filed at 12/15/2021 0400 Gross per 24 hour  Intake 363.45 ml  Output --  Net 363.45 ml    Wt Readings from Last 3 Encounters:  12/13/21 41.2 kg  11/29/21 41.7 kg  11/14/21 40.5 kg    Examination:  Constitutional: NAD Eyes: lids and conjunctivae  normal, no scleral icterus ENMT: mmm Neck: normal, supple Respiratory: clear to auscultation bilaterally, no wheezing, no crackles. Normal respiratory effort.  Cardiovascular: Regular rate and rhythm, no murmurs / rubs / gallops. No LE edema. Abdomen: soft, no distention, no tenderness. Bowel sounds positive.  Skin: no rashes Neurologic: no focal deficits, equal strength   Data Reviewed: I have independently reviewed following labs and imaging studies   CBC Recent Labs  Lab 12/12/21 1450 12/13/21 0842 12/14/21 0701 12/15/21 0056  WBC 8.3 7.5 5.5 6.5  HGB 13.9 11.9* 13.4 12.2  HCT 40.7 35.4* 39.2 36.4  PLT 181 137* 155 178  MCV 93.8 96.5 96.1 95.0  MCH 32.0 32.4 32.8 31.9  MCHC 34.2 33.6 34.2 33.5  RDW 12.5 12.7 12.4 12.5  LYMPHSABS  --  0.3* 0.7  --   MONOABS  --  0.5 0.8  --   EOSABS  --  0.0 0.0  --   BASOSABS  --  0.0 0.0  --      Recent Labs  Lab 12/12/21 1450 12/13/21 0842 12/14/21 0701 12/14/21 1700 12/15/21 0056  NA 133* 137 134*  --  133*  K 3.7 3.5 3.2*  --  3.4*  CL 100 105 100  --  103  CO2 '25 24 25  '$ --  21*  GLUCOSE 98 119* 94  --  115*  BUN '19 18 9  '$ --  20  CREATININE 0.62 0.63 0.52  --  0.61  CALCIUM 9.3 8.6* 9.0  --  9.1  AST 22 34 83*  --  256*  ALT 17 29 42  --  250*  ALKPHOS 96 72 76  --  242*  BILITOT 0.6 0.6 0.8  --  0.6  ALBUMIN 4.2 3.1* 3.4*  --  3.0*  MG  --  1.8 1.8  --   --   TSH  --   --   --  6.357*  --      ------------------------------------------------------------------------------------------------------------------ Recent Labs    12/15/21 0056  LDLDIRECT 82    No results found for: "HGBA1C" ------------------------------------------------------------------------------------------------------------------ Recent Labs    12/14/21 1700  TSH 6.357*    Cardiac Enzymes No results for input(s): "CKMB", "TROPONINI", "MYOGLOBIN" in the last 168 hours.  Invalid input(s):  "CK" ------------------------------------------------------------------------------------------------------------------ No results found for: "BNP"  CBG: No results for input(s): "GLUCAP" in the last 168 hours.  Recent Results (from the past 240 hour(s))  C Difficile Quick Screen w PCR reflex     Status: Abnormal   Collection Time: 12/12/21  5:11 PM   Specimen: STOOL  Result Value Ref Range Status   C Diff antigen POSITIVE (A) NEGATIVE Final   C Diff toxin NEGATIVE NEGATIVE Final   C Diff interpretation Results are indeterminate. See PCR results.  Final    Comment: Performed at [facility], [place] [address], [place], [place] [address]  C. Diff  by PCR, Reflexed     Status: Abnormal   Collection Time: 12/12/21  5:11 PM  Result Value Ref Range Status   Toxigenic C. Difficile by PCR POSITIVE (A) NEGATIVE Final    Comment: Positive for toxigenic C. difficile with little to no toxin production. Only treat if clinical presentation suggests symptomatic illness. CRITICAL RESULT CALLED TO, READ BACK BY AND VERIFIED WITH:  C/ ANDREW P., RN 12/13/21 0030 A. LAFRANCE  Performed at [facility], [place] [address], [place], [place] [address]   Gastrointestinal Panel by PCR , Stool     Status: None   Collection Time: 12/12/21  5:45 PM  Result Value Ref Range Status   Campylobacter species NOT DETECTED NOT DETECTED Final   Plesimonas shigelloides NOT DETECTED NOT DETECTED Final   Salmonella species NOT DETECTED NOT DETECTED Final   Yersinia enterocolitica NOT DETECTED NOT DETECTED Final   Vibrio species NOT DETECTED NOT DETECTED Final   Vibrio cholerae NOT DETECTED NOT DETECTED Final   Enteroaggregative E coli (EAEC) NOT DETECTED NOT DETECTED Final   Enteropathogenic E coli (EPEC) NOT DETECTED NOT DETECTED Final   Enterotoxigenic E coli (ETEC) NOT DETECTED NOT DETECTED Final   Shiga like toxin producing E coli (STEC) NOT DETECTED NOT DETECTED Final   Shigella/Enteroinvasive E coli (EIEC)  NOT DETECTED NOT DETECTED Final   Cryptosporidium NOT DETECTED NOT DETECTED Final   Cyclospora cayetanensis NOT DETECTED NOT DETECTED Final   Entamoeba histolytica NOT DETECTED NOT DETECTED Final   Giardia lamblia NOT DETECTED NOT DETECTED Final   Adenovirus F40/41 NOT DETECTED NOT DETECTED Final   Astrovirus NOT DETECTED NOT DETECTED Final   Norovirus GI/GII NOT DETECTED NOT DETECTED Final   Rotavirus A NOT DETECTED NOT DETECTED Final   Sapovirus (I, II, IV, and V) NOT DETECTED NOT DETECTED Final    Comment: Performed at [facility], [address] [place], [place] [address]     Radiology Studies: US Abdomen Limited RUQ (LIVER/GB)  Result Date: 12/15/2021 CLINICAL DATA:  Elevated LFTs. EXAM: ULTRASOUND ABDOMEN LIMITED RIGHT UPPER QUADRANT COMPARISON:  CT abdomen/pelvis 12/12/2021 FINDINGS: Gallbladder: Prior cholecystectomy. Common bile duct: Diameter: 3 mm. Liver: No focal lesion identified. Within normal limits in parenchymal echogenicity. Portal vein is patent on color Doppler imaging with normal direction of blood flow towards the liver. Other: No free fluid. IMPRESSION: 1.  No acute findings. 2.  Prior cholecystectomy. Electronically Signed   By: Marin Olp M.D.   On: 12/15/2021 09:52   ECHOCARDIOGRAM COMPLETE  Result Date: 12/14/2021    ECHOCARDIOGRAM REPORT   Patient Name:   ADALEIGH WARF Date of Exam: 12/14/2021 Medical Rec #:  [id number]       Height:       62.0 in Accession #:    [id number]      Weight:       90.8 lb Date of Birth:  11-15-1935      BSA:          1.367 m Patient Age:    31 years        BP:           147/92 mmHg Patient Gender: F               HR:           123 bpm. Exam Location:  Inpatient Procedure: 2D Echo, Color Doppler and Cardiac Doppler Indications:    A-fib  History:        Patient  has no prior history of Echocardiogram examinations.                 Risk Factors:Dyslipidemia.  Sonographer:    Raquel Sarna Senior RDCS Referring Phys: [place]  1. Left ventricular ejection fraction, by estimation, is 55 to 60%. The left ventricle has normal function. The left ventricle has no regional wall motion abnormalities. Left ventricular diastolic function could not be evaluated.  2. Right ventricular systolic function is low normal. The right ventricular size is normal. There is normal pulmonary artery systolic pressure. The estimated right ventricular systolic pressure is 45.8 mmHg.  3. The mitral valve is degenerative. Mild mitral valve regurgitation. No evidence of mitral stenosis.  4. The aortic valve is grossly normal. Aortic valve regurgitation is not visualized. Aortic valve sclerosis/calcification is present, without any evidence of aortic stenosis.  5. The inferior vena cava is normal in size with greater than 50% respiratory variability, suggesting right atrial pressure of 3 mmHg.  6. Rhythm strip during this exam demonstrates Afib RVR. Comparison(s): No prior Echocardiogram. FINDINGS  Left Ventricle: Left ventricular ejection fraction, by estimation, is 55 to 60%. The left ventricle has normal function. The left ventricle has no regional wall motion abnormalities. The left ventricular internal cavity size was normal in size. There is  no left ventricular hypertrophy. Left ventricular diastolic function could not be evaluated due to atrial fibrillation. Left ventricular diastolic function could not be evaluated. Right Ventricle: The right ventricular size is normal. No increase in right ventricular wall thickness. Right ventricular systolic function is low normal. There is normal pulmonary artery systolic pressure. The tricuspid regurgitant velocity is 2.51 m/s,  and with an assumed right atrial pressure of 3 mmHg, the estimated right ventricular systolic pressure is 09.9 mmHg. Left Atrium: Left atrial size was normal in size. Right Atrium: Right atrial size was normal in size. Pericardium: There is no evidence of pericardial  effusion. Mitral Valve: The mitral valve is degenerative in appearance. There is moderate thickening of the mitral valve leaflet(s). Mildly decreased mobility of the mitral valve leaflets. Mild mitral annular calcification. Mild mitral valve regurgitation. No evidence of mitral valve stenosis. Tricuspid Valve: The tricuspid valve is grossly normal. Tricuspid valve regurgitation is mild . No evidence of tricuspid stenosis. Aortic Valve: The aortic valve is grossly normal. Aortic valve regurgitation is not visualized. Aortic valve sclerosis/calcification is present, without any evidence of aortic stenosis. Pulmonic Valve: The pulmonic valve was not well visualized. Pulmonic valve regurgitation is trivial. No evidence of pulmonic stenosis. Aorta: The aortic root and ascending aorta are structurally normal, with no evidence of dilitation. Venous: The inferior vena cava is normal in size with greater than 50% respiratory variability, suggesting right atrial pressure of 3 mmHg. IAS/Shunts: The atrial septum is grossly normal. EKG: Rhythm strip during this exam demonstrates Afib RVR.  LEFT VENTRICLE PLAX 2D LVIDd:         3.80 cm LVIDs:         2.90 cm LV PW:         0.80 cm LV IVS:        0.70 cm LVOT diam:     1.80 cm LV SV:         31 LV SV Index:   23 LVOT Area:     2.54 cm  RIGHT VENTRICLE RV S prime:     11.10 cm/s TAPSE (M-mode): 1.4 cm LEFT ATRIUM  Index        RIGHT ATRIUM           Index LA diam:        2.90 cm 2.12 cm/m   RA Area:     13.80 cm LA Vol (A2C):   48.6 ml 35.56 ml/m  RA Volume:   33.80 ml  24.73 ml/m LA Vol (A4C):   30.2 ml 22.09 ml/m LA Biplane Vol: 38.3 ml 28.02 ml/m  AORTIC VALVE LVOT Vmax:   81.25 cm/s LVOT Vmean:  55.250 cm/s LVOT VTI:    0.122 m  AORTA Ao Root diam: 2.50 cm Ao Asc diam:  3.40 cm MR Peak grad:    73.6 mmHg    TRICUSPID VALVE MR Mean grad:    47.5 mmHg    TR Peak grad:   25.2 mmHg MR Vmax:         429.00 cm/s  TR Vmax:        251.00 cm/s MR Vmean:        321.5  cm/s MR PISA:         0.25 cm     SHUNTS MR PISA Eff ROA: 2 mm        Systemic VTI:  0.12 m MR PISA Radius:  0.20 cm      Systemic Diam: 1.80 cm Sunit Tolia DO Electronically signed by Rex Kras DO Signature Date/Time: 12/14/2021/2:40:35 PM    Final      Marzetta Board, MD, PhD [facility]  Between 7 am - 7 pm I am available, please contact me via Amion (for emergencies) or Securechat (non urgent messages)  Between 7 pm - 7 am I am not available, please contact night coverage MD/APP via Amion

## 2021-12-15 NOTE — Discharge Instructions (Signed)

## 2021-12-16 ENCOUNTER — Inpatient Hospital Stay (HOSPITAL_COMMUNITY): Payer: Medicare Other

## 2021-12-16 ENCOUNTER — Other Ambulatory Visit: Payer: Self-pay

## 2021-12-16 DIAGNOSIS — A0472 Enterocolitis due to Clostridium difficile, not specified as recurrent: Secondary | ICD-10-CM | POA: Diagnosis not present

## 2021-12-16 LAB — CBC
HCT: 33.2 % — ABNORMAL LOW (ref 36.0–46.0)
Hemoglobin: 11.2 g/dL — ABNORMAL LOW (ref 12.0–15.0)
MCH: 32 pg (ref 26.0–34.0)
MCHC: 33.7 g/dL (ref 30.0–36.0)
MCV: 94.9 fL (ref 80.0–100.0)
Platelets: 154 10*3/uL (ref 150–400)
RBC: 3.5 MIL/uL — ABNORMAL LOW (ref 3.87–5.11)
RDW: 12.8 % (ref 11.5–15.5)
WBC: 8.2 10*3/uL (ref 4.0–10.5)
nRBC: 0 % (ref 0.0–0.2)

## 2021-12-16 LAB — TROPONIN I (HIGH SENSITIVITY)
Troponin I (High Sensitivity): 28 ng/L — ABNORMAL HIGH (ref <18)
Troponin I (High Sensitivity): 24 ng/L — ABNORMAL HIGH (ref <18)

## 2021-12-16 LAB — COMPREHENSIVE METABOLIC PANEL
ALT: 177 U/L — ABNORMAL HIGH (ref 0–44)
AST: 114 U/L — ABNORMAL HIGH (ref 15–41)
Albumin: 2.8 g/dL — ABNORMAL LOW (ref 3.5–5.0)
Alkaline Phosphatase: 201 U/L — ABNORMAL HIGH (ref 38–126)
Anion gap: 6 (ref 5–15)
BUN: 16 mg/dL (ref 8–23)
CO2: 23 mmol/L (ref 22–32)
Calcium: 8.4 mg/dL — ABNORMAL LOW (ref 8.9–10.3)
Chloride: 105 mmol/L (ref 98–111)
Creatinine, Ser: 0.46 mg/dL (ref 0.44–1.00)
GFR, Estimated: >60 mL/min (ref >60)
Glucose, Bld: 90 mg/dL (ref 70–99)
Potassium: 3.6 mmol/L (ref 3.5–5.1)
Sodium: 134 mmol/L — ABNORMAL LOW (ref 135–145)
Total Bilirubin: 0.7 mg/dL (ref 0.3–1.2)
Total Protein: 5.3 g/dL — ABNORMAL LOW (ref 6.5–8.1)

## 2021-12-16 MED ORDER — IPRATROPIUM-ALBUTEROL 0.5-2.5 (3) MG/3ML IN SOLN
3.0000 mL | Freq: Four times a day (QID) | RESPIRATORY_TRACT | Status: DC
Start: 2021-12-17 — End: 2021-12-17
  Administered 2021-12-17 (×2): 3 mL via RESPIRATORY_TRACT
  Filled 2021-12-16 (×2): qty 3

## 2021-12-16 MED ORDER — ALUM & MAG HYDROXIDE-SIMETH 200-200-20 MG/5ML PO SUSP
30.0000 mL | ORAL | Status: DC | PRN
Start: 2021-12-16 — End: 2021-12-18
  Administered 2021-12-16: 30 mL via ORAL
  Filled 2021-12-16: qty 30

## 2021-12-16 MED ORDER — POTASSIUM CHLORIDE CRYS ER 20 MEQ PO TBCR
40.0000 meq | EXTENDED_RELEASE_TABLET | Freq: Once | ORAL | Status: AC
  Administered 2021-12-16: 40 meq via ORAL
  Filled 2021-12-16: qty 2

## 2021-12-16 MED ORDER — MORPHINE SULFATE (PF) 2 MG/ML IV SOLN
2.0000 mg | Freq: Once | INTRAVENOUS | Status: AC
  Administered 2021-12-16: 2 mg via INTRAVENOUS
  Filled 2021-12-16: qty 1

## 2021-12-16 MED ORDER — IPRATROPIUM-ALBUTEROL 0.5-2.5 (3) MG/3ML IN SOLN
3.0000 mL | Freq: Four times a day (QID) | RESPIRATORY_TRACT | Status: DC | PRN
  Administered 2021-12-16: 3 mL via RESPIRATORY_TRACT
  Filled 2021-12-16: qty 3

## 2021-12-16 NOTE — Progress Notes (Signed)
PROGRESS NOTE  Julie Montgomery XNA:[id number] DOB: 1935/08/14 DOA: 12/12/2021 PCP: Cassandria Anger, MD   LOS: 2 days   Brief Narrative / Interim history: 86 year old female with history of prior C. difficile infections, basal cell carcinoma, bronchiectasis who comes into the hospital with diarrhea and weakness.  She saw her dermatologist about 3 weeks ago, had a precancerous lesion removed, and following that she was placed on Keflex due to developing redness and warmth around the removed lesion.  She finished Keflex, and the next day on 8/14 started developing profuse watery diarrhea 20+ stools a day.  She came to the hospital, was found to be dehydrated and stool studies were positive for C. difficile.  She was placed on p.o. vancomycin  Subjective / 24h Interval events: Had an episode of shortness of breath last night.  Feels better this morning.  No longer having diarrhea  Assesement and Plan: Principal Problem:   Clostridium difficile colitis Active Problems:   Dehydration with hyponatremia   Protein-calorie malnutrition, severe (HCC)   Paroxysmal atrial fibrillation with RVR (HCC)   C. difficile colitis  Principal problem C. difficile colitis, diarrhea-diarrhea seems to have resolved.  Continue fidaxomicin  Active problems Paroxysmal A-fib with RVR-new onset 8/17, heart rates were in the 180s-190s.  Cardiology consulted.  Due to elevated CHA2DS2-VASc score of 5, recommending anticoagulation, she was on heparin and now transition to Eliquis.  She was briefly on amiodarone but discontinued once she converted to sinus, as she was bradycardic.  Currently on metoprolol alone. Echocardiogram showed EF 55-60%, no WMA, RV was low normal.  Remains in sinus  Elevated LFTs-possibly transient poor perfusion in the setting of A-fib with RVR with rates into the 180s.  Right upper quadrant ultrasound fairly unremarkable.  LFTs improving today  Hyponatremia-mild, due to dehydration.  Sodium  stable at 134 this morning   Hypokalemia-continue to replete potassium today  Scalp lesion-status post removal by dermatology 3 weeks prior, completed a course of Keflex for concern for surrounding cellulitis.  Stable now  Scheduled Meds:  apixaban  2.5 mg Oral BID   fidaxomicin  200 mg Oral BID   lactose free nutrition  237 mL Oral BID BM   metoprolol tartrate  25 mg Oral BID   multivitamin with minerals  1 tablet Oral Daily   potassium chloride  40 mEq Oral Once   Continuous Infusions:   PRN Meds:.acetaminophen **OR** acetaminophen, oxyCODONE-acetaminophen **OR** morphine injection, ondansetron **OR** ondansetron (ZOFRAN) IV  Diet Orders (From admission, onward)     Start     Ordered   12/14/21 1429  Diet regular Room service appropriate? Yes; Fluid consistency: Thin  Diet effective now       Question Answer Comment  Room service appropriate? Yes   Fluid consistency: Thin      12/14/21 1428            DVT prophylaxis: apixaban (ELIQUIS) tablet 2.5 mg Start: 12/15/21 1800 apixaban (ELIQUIS) tablet 2.5 mg   Lab Results  Component Value Date   PLT 154 12/16/2021      Code Status: Full Code  Family Communication: Husband and son at bedside  Status is: Inpatient  Level of care: Progressive  Consultants:  Cardiology   Objective: Vitals:   12/15/21 1435 12/15/21 2009 12/15/21 2107 12/16/21 0454  BP: 130/65  (!) 108/53 138/66  Pulse:   (!) 59 73  Resp:    (!) 22  Temp:  98.5 F (36.9 C)  98.2 F (36.8 C)  TempSrc:  Oral  Oral  SpO2:  94%  96%  Weight:      Height:        Intake/Output Summary (Last 24 hours) at 12/16/2021 1055 Last data filed at 12/16/2021 0900 Gross per 24 hour  Intake 1598.8 ml  Output --  Net 1598.8 ml    Wt Readings from Last 3 Encounters:  12/13/21 41.2 kg  11/29/21 41.7 kg  11/14/21 40.5 kg    Examination:  Constitutional: NAD Eyes: lids and conjunctivae normal, no scleral icterus ENMT: mmm Neck: normal,  supple Respiratory: clear to auscultation bilaterally, no wheezing, no crackles. Normal respiratory effort.  Cardiovascular: Regular rate and rhythm, no murmurs / rubs / gallops. No LE edema. Abdomen: soft, no distention, no tenderness. Bowel sounds positive.  Skin: no rashes Neurologic: no focal deficits, equal strength   Data Reviewed: I have independently reviewed following labs and imaging studies   CBC Recent Labs  Lab 12/12/21 1450 12/13/21 0842 12/14/21 0701 12/15/21 0056 12/16/21 0521  WBC 8.3 7.5 5.5 6.5 8.2  HGB 13.9 11.9* 13.4 12.2 11.2*  HCT 40.7 35.4* 39.2 36.4 33.2*  PLT 181 137* 155 178 154  MCV 93.8 96.5 96.1 95.0 94.9  MCH 32.0 32.4 32.8 31.9 32.0  MCHC 34.2 33.6 34.2 33.5 33.7  RDW 12.5 12.7 12.4 12.5 12.8  LYMPHSABS  --  0.3* 0.7  --   --   MONOABS  --  0.5 0.8  --   --   EOSABS  --  0.0 0.0  --   --   BASOSABS  --  0.0 0.0  --   --      Recent Labs  Lab 12/12/21 1450 12/13/21 0842 12/14/21 0701 12/14/21 1700 12/15/21 0056 12/16/21 0521  NA 133* 137 134*  --  133* 134*  K 3.7 3.5 3.2*  --  3.4* 3.6  CL 100 105 100  --  103 105  CO2 '25 24 25  '$ --  21* 23  GLUCOSE 98 119* 94  --  115* 90  BUN '19 18 9  '$ --  20 16  CREATININE 0.62 0.63 0.52  --  0.61 0.46  CALCIUM 9.3 8.6* 9.0  --  9.1 8.4*  AST 22 34 83*  --  256* 114*  ALT 17 29 42  --  250* 177*  ALKPHOS 96 72 76  --  242* 201*  BILITOT 0.6 0.6 0.8  --  0.6 0.7  ALBUMIN 4.2 3.1* 3.4*  --  3.0* 2.8*  MG  --  1.8 1.8  --   --   --   TSH  --   --   --  6.357*  --   --      ------------------------------------------------------------------------------------------------------------------ Recent Labs    12/15/21 0056 12/15/21 1128  CHOL  --  156  HDL  --  53  LDLCALC  --  86  TRIG  --  84  CHOLHDL  --  2.9  LDLDIRECT 82  --      No results found for: "HGBA1C" ------------------------------------------------------------------------------------------------------------------ Recent  Labs    12/14/21 1700  TSH 6.357*     Cardiac Enzymes No results for input(s): "CKMB", "TROPONINI", "MYOGLOBIN" in the last 168 hours.  Invalid input(s): "CK" ------------------------------------------------------------------------------------------------------------------ No results found for: "BNP"  CBG: No results for input(s): "GLUCAP" in the last 168 hours.  Recent Results (from the past 240 hour(s))  C Difficile Quick Screen w PCR reflex     Status: Abnormal  Collection Time: 12/12/21  5:11 PM   Specimen: STOOL  Result Value Ref Range Status   C Diff antigen POSITIVE (A) NEGATIVE Final   C Diff toxin NEGATIVE NEGATIVE Final   C Diff interpretation Results are indeterminate. See PCR results.  Final    Comment: Performed at [facility], [place] [address] [place], [place] [address]  C. Diff by PCR, Reflexed     Status: Abnormal   Collection Time: 12/12/21  5:11 PM  Result Value Ref Range Status   Toxigenic C. Difficile by PCR POSITIVE (A) NEGATIVE Final    Comment: Positive for toxigenic C. difficile with little to no toxin production. Only treat if clinical presentation suggests symptomatic illness. CRITICAL RESULT CALLED TO, READ BACK BY AND VERIFIED WITH:  C/ ANDREW P., RN 12/13/21 0030 A. LAFRANCE  Performed at [facility], [place] [address], [place], [place] [address]   Gastrointestinal Panel by PCR , Stool     Status: None   Collection Time: 12/12/21  5:45 PM  Result Value Ref Range Status   Campylobacter species NOT DETECTED NOT DETECTED Final   Plesimonas shigelloides NOT DETECTED NOT DETECTED Final   Salmonella species NOT DETECTED NOT DETECTED Final   Yersinia enterocolitica NOT DETECTED NOT DETECTED Final   Vibrio species NOT DETECTED NOT DETECTED Final   Vibrio cholerae NOT DETECTED NOT DETECTED Final   Enteroaggregative E coli (EAEC) NOT DETECTED NOT DETECTED Final   Enteropathogenic E coli (EPEC) NOT DETECTED NOT DETECTED Final    Enterotoxigenic E coli (ETEC) NOT DETECTED NOT DETECTED Final   Shiga like toxin producing E coli (STEC) NOT DETECTED NOT DETECTED Final   Shigella/Enteroinvasive E coli (EIEC) NOT DETECTED NOT DETECTED Final   Cryptosporidium NOT DETECTED NOT DETECTED Final   Cyclospora cayetanensis NOT DETECTED NOT DETECTED Final   Entamoeba histolytica NOT DETECTED NOT DETECTED Final   Giardia lamblia NOT DETECTED NOT DETECTED Final   Adenovirus F40/41 NOT DETECTED NOT DETECTED Final   Astrovirus NOT DETECTED NOT DETECTED Final   Norovirus GI/GII NOT DETECTED NOT DETECTED Final   Rotavirus A NOT DETECTED NOT DETECTED Final   Sapovirus (I, II, IV, and V) NOT DETECTED NOT DETECTED Final    Comment: Performed at [facility], [address], [place], [place] [address]     Radiology Studies: DG CHEST PORT 1 VIEW  Result Date: 12/16/2021 CLINICAL DATA:  Dyspnea. EXAM: PORTABLE CHEST 1 VIEW COMPARISON:  01/29/2020 FINDINGS: 0537 hours. Lungs are hyperexpanded. Interstitial markings are diffusely coarsened with chronic features. Nodule in the right mid lung is stable, likely calcified granuloma. Cardiopericardial silhouette is at upper limits of normal for size. Bones are diffusely demineralized. Telemetry leads overlie the chest. IMPRESSION: Hyperexpansion with chronic interstitial coarsening. No acute cardiopulmonary findings. Electronically Signed   By: Misty Stanley M.D.   On: 12/16/2021 06:25     Marzetta Board, MD, PhD [facility]  Between 7 am - 7 pm I am available, please contact me via Amion (for emergencies) or Securechat (non urgent messages)  Between 7 pm - 7 am I am not available, please contact night coverage MD/APP via Amion

## 2021-12-16 NOTE — Progress Notes (Addendum)
Following the patient peripherally since she converted to normal sinus rhythm.  Telemetry reviewed and she remains in sinus rhythm.  Continue current cardiovascular management as per prior progress note.  Outpatient follow-up scheduled &  discharge paperwork updated.  No charge.  Rex Kras, [place], [facility]  Pager: [phone] Office: [phone] 1:00 PM

## 2021-12-16 NOTE — Progress Notes (Signed)
At 04:58, RN had gotten pt up to bedside commode to use bathroom and she was weak, but was able to transfer with 1-assist. RN got patient back in bed and she was fine, no complaints except for wanting a bath. RN asked techs to give pt bath and that was done. The techs said she appeared SOB when giving bath. RN placed pt on 2 L [place], elevated HOB, Obtained vital signs (view chart); VS were stable. Notified provider of change in pt status and intervened as ordered. EKG was done and placed in chart, no arrhythmias identified. Stat troponin's were drawn , and IR came up for chest x-ray. Currently waiting on lab results and imaging. Pt has verbalized it feels like her wheezing has gotten better and then proceeded to say it felt like indigestion.  There was not any audible wheezing auscultated.

## 2021-12-17 DIAGNOSIS — I2584 Coronary atherosclerosis due to calcified coronary lesion: Secondary | ICD-10-CM

## 2021-12-17 DIAGNOSIS — R197 Diarrhea, unspecified: Secondary | ICD-10-CM

## 2021-12-17 DIAGNOSIS — I2510 Atherosclerotic heart disease of native coronary artery without angina pectoris: Secondary | ICD-10-CM

## 2021-12-17 DIAGNOSIS — I700 Atherosclerosis of aorta: Secondary | ICD-10-CM

## 2021-12-17 DIAGNOSIS — E871 Hypo-osmolality and hyponatremia: Secondary | ICD-10-CM | POA: Diagnosis not present

## 2021-12-17 DIAGNOSIS — E876 Hypokalemia: Secondary | ICD-10-CM

## 2021-12-17 DIAGNOSIS — A0472 Enterocolitis due to Clostridium difficile, not specified as recurrent: Secondary | ICD-10-CM | POA: Diagnosis not present

## 2021-12-17 DIAGNOSIS — I480 Paroxysmal atrial fibrillation: Secondary | ICD-10-CM

## 2021-12-17 MED ORDER — ORAL CARE MOUTH RINSE: 15.0000 mL | OROMUCOSAL | Status: DC | PRN

## 2021-12-17 MED ORDER — FUROSEMIDE 10 MG/ML IJ SOLN
40.0000 mg | Freq: Once | INTRAMUSCULAR | Status: AC
  Administered 2021-12-17: 40 mg via INTRAVENOUS
  Filled 2021-12-17: qty 4

## 2021-12-17 MED ORDER — IPRATROPIUM-ALBUTEROL 0.5-2.5 (3) MG/3ML IN SOLN
3.0000 mL | Freq: Two times a day (BID) | RESPIRATORY_TRACT | Status: DC
Start: 2021-12-17 — End: 2021-12-18
  Administered 2021-12-17 – 2021-12-18 (×2): 3 mL via RESPIRATORY_TRACT
  Filled 2021-12-17 (×2): qty 3

## 2021-12-17 NOTE — Progress Notes (Signed)
Notified on call provider that patient was having difficulty breathing, complaining of chest pain, and having tightness/smothering sensation. RN had put patient on 2 liters of oxygen due to O2 sats hanging around 90%. Patient also had some chest congestion and bilateral rhonchi breath sounds. Nurse obtained a 12-lead EKG and placed copy in patient's shadow chart. On call provider put in new orders.

## 2021-12-17 NOTE — Progress Notes (Signed)
PROGRESS NOTE  Julie Montgomery DUK:[id number] DOB: 21-Jun-1935 DOA: 12/12/2021 PCP: Cassandria Anger, MD   LOS: 3 days   Brief Narrative / Interim history: 86 year old female with history of prior C. difficile infections, basal cell carcinoma, bronchiectasis who comes into the hospital with diarrhea and weakness.  She saw her dermatologist about 3 weeks ago, had a precancerous lesion removed, and following that she was placed on Keflex due to developing redness and warmth around the removed lesion.  She finished Keflex, and the next day on 8/14 started developing profuse watery diarrhea 20+ stools a day.  She came to the hospital, was found to be dehydrated and stool studies were positive for C. difficile.  She was placed on p.o. vancomycin  Subjective / 24h Interval events: Had a recurrent episode of shortness of breath last night.  Tells me she was very anxious and had a panic attack.  This usually does not happen to her  Assesement and Plan: Principal Problem:   Clostridium difficile colitis Active Problems:   Dehydration with hyponatremia   Protein-calorie malnutrition, severe (HCC)   Paroxysmal atrial fibrillation with RVR (HCC)   C. difficile colitis  Principal problem C. difficile colitis, diarrhea-area resolved, no abdominal pain, eating well.  Continue fidaxomicin  Active problems Paroxysmal A-fib with RVR-new onset 8/17, heart rates were in the 180s-190s.  Cardiology consulted.  Due to elevated CHA2DS2-VASc score of 5, recommending anticoagulation, she was on heparin and now transition to Eliquis.  She was briefly on amiodarone but discontinued once she converted to sinus, as she was bradycardic.  Currently on metoprolol alone. Echocardiogram showed EF 55-60%, no WMA, RV was low normal.  She remains in sinus  Dyspnea-happened in the evening when she is laying flat, she is up 5 L positive due to IV fluids in the setting of her profound diarrhea, even though the chest x-ray is  negative could potentially have a degree of fluid overload.  We will give Lasix x1  Elevated LFTs-possibly transient poor perfusion in the setting of A-fib with RVR with rates into the 180s.  Right upper quadrant ultrasound fairly unremarkable.  LFTs improving  Hyponatremia-mild, due to dehydration.  Recheck sodium tomorrow morning  Hypokalemia-recheck potassium  Scalp lesion-status post removal by dermatology 3 weeks prior, completed a course of Keflex for concern for surrounding cellulitis.  Stable now  Scheduled Meds:  apixaban  2.5 mg Oral BID   fidaxomicin  200 mg Oral BID   ipratropium-albuterol  3 mL Nebulization Q6H   lactose free nutrition  237 mL Oral BID BM   metoprolol tartrate  25 mg Oral BID   multivitamin with minerals  1 tablet Oral Daily   Continuous Infusions:   PRN Meds:.acetaminophen **OR** acetaminophen, alum & mag hydroxide-simeth, ipratropium-albuterol, oxyCODONE-acetaminophen **OR** morphine injection, ondansetron **OR** ondansetron (ZOFRAN) IV, mouth rinse  Diet Orders (From admission, onward)     Start     Ordered   12/14/21 1429  Diet regular Room service appropriate? Yes; Fluid consistency: Thin  Diet effective now       Question Answer Comment  Room service appropriate? Yes   Fluid consistency: Thin      12/14/21 1428            DVT prophylaxis: apixaban (ELIQUIS) tablet 2.5 mg Start: 12/15/21 1800 apixaban (ELIQUIS) tablet 2.5 mg   Lab Results  Component Value Date   PLT 154 12/16/2021      Code Status: Full Code  Family Communication: Husband and son at  bedside  Status is: Inpatient  Level of care: Progressive  Consultants:  Cardiology   Objective: Vitals:   12/17/21 0134 12/17/21 0515 12/17/21 0843 12/17/21 1002  BP:  133/79    Pulse:  61  84  Resp:  20    Temp:  98.3 F (36.8 C)    TempSrc:  Oral    SpO2: 100% 93% 92%   Weight:      Height:        Intake/Output Summary (Last 24 hours) at 12/17/2021 1122 Last data  filed at 12/17/2021 1037 Gross per 24 hour  Intake 840 ml  Output --  Net 840 ml    Wt Readings from Last 3 Encounters:  12/13/21 41.2 kg  11/29/21 41.7 kg  11/14/21 40.5 kg    Examination:  Constitutional: NAD Eyes: lids and conjunctivae normal, no scleral icterus ENMT: mmm Neck: normal, supple Respiratory: clear to auscultation bilaterally, no wheezing, no crackles. Normal respiratory effort.  Cardiovascular: Regular rate and rhythm, no murmurs / rubs / gallops. No LE edema. Abdomen: soft, no distention, no tenderness. Bowel sounds positive.  Skin: no rashes Neurologic: no focal deficits, equal strength   Data Reviewed: I have independently reviewed following labs and imaging studies   CBC Recent Labs  Lab 12/12/21 1450 12/13/21 0842 12/14/21 0701 12/15/21 0056 12/16/21 0521  WBC 8.3 7.5 5.5 6.5 8.2  HGB 13.9 11.9* 13.4 12.2 11.2*  HCT 40.7 35.4* 39.2 36.4 33.2*  PLT 181 137* 155 178 154  MCV 93.8 96.5 96.1 95.0 94.9  MCH 32.0 32.4 32.8 31.9 32.0  MCHC 34.2 33.6 34.2 33.5 33.7  RDW 12.5 12.7 12.4 12.5 12.8  LYMPHSABS  --  0.3* 0.7  --   --   MONOABS  --  0.5 0.8  --   --   EOSABS  --  0.0 0.0  --   --   BASOSABS  --  0.0 0.0  --   --      Recent Labs  Lab 12/12/21 1450 12/13/21 0842 12/14/21 0701 12/14/21 1700 12/15/21 0056 12/16/21 0521  NA 133* 137 134*  --  133* 134*  K 3.7 3.5 3.2*  --  3.4* 3.6  CL 100 105 100  --  103 105  CO2 '25 24 25  '$ --  21* 23  GLUCOSE 98 119* 94  --  115* 90  BUN '19 18 9  '$ --  20 16  CREATININE 0.62 0.63 0.52  --  0.61 0.46  CALCIUM 9.3 8.6* 9.0  --  9.1 8.4*  AST 22 34 83*  --  256* 114*  ALT 17 29 42  --  250* 177*  ALKPHOS 96 72 76  --  242* 201*  BILITOT 0.6 0.6 0.8  --  0.6 0.7  ALBUMIN 4.2 3.1* 3.4*  --  3.0* 2.8*  MG  --  1.8 1.8  --   --   --   TSH  --   --   --  6.357*  --   --      ------------------------------------------------------------------------------------------------------------------ Recent  Labs    12/15/21 0056 12/15/21 1128  CHOL  --  156  HDL  --  53  LDLCALC  --  86  TRIG  --  84  CHOLHDL  --  2.9  LDLDIRECT 82  --      No results found for: "HGBA1C" ------------------------------------------------------------------------------------------------------------------ Recent Labs    12/14/21 1700  TSH 6.357*     Cardiac Enzymes No results  for input(s): "CKMB", "TROPONINI", "MYOGLOBIN" in the last 168 hours.  Invalid input(s): "CK" ------------------------------------------------------------------------------------------------------------------ No results found for: "BNP"  CBG: No results for input(s): "GLUCAP" in the last 168 hours.  Recent Results (from the past 240 hour(s))  C Difficile Quick Screen w PCR reflex     Status: Abnormal   Collection Time: 12/12/21  5:11 PM   Specimen: STOOL  Result Value Ref Range Status   C Diff antigen POSITIVE (A) NEGATIVE Final   C Diff toxin NEGATIVE NEGATIVE Final   C Diff interpretation Results are indeterminate. See PCR results.  Final    Comment: Performed at [facility], [place] [address], [place], [place] [address]  C. Diff by PCR, Reflexed     Status: Abnormal   Collection Time: 12/12/21  5:11 PM  Result Value Ref Range Status   Toxigenic C. Difficile by PCR POSITIVE (A) NEGATIVE Final    Comment: Positive for toxigenic C. difficile with little to no toxin production. Only treat if clinical presentation suggests symptomatic illness. CRITICAL RESULT CALLED TO, READ BACK BY AND VERIFIED WITH:  C/ ANDREW P., RN 12/13/21 0030 A. LAFRANCE  Performed at [facility], [place] [address] [place], [place] [address]   Gastrointestinal Panel by PCR , Stool     Status: None   Collection Time: 12/12/21  5:45 PM  Result Value Ref Range Status   Campylobacter species NOT DETECTED NOT DETECTED Final   Plesimonas shigelloides NOT DETECTED NOT DETECTED Final   Salmonella species NOT DETECTED NOT DETECTED Final    Yersinia enterocolitica NOT DETECTED NOT DETECTED Final   Vibrio species NOT DETECTED NOT DETECTED Final   Vibrio cholerae NOT DETECTED NOT DETECTED Final   Enteroaggregative E coli (EAEC) NOT DETECTED NOT DETECTED Final   Enteropathogenic E coli (EPEC) NOT DETECTED NOT DETECTED Final   Enterotoxigenic E coli (ETEC) NOT DETECTED NOT DETECTED Final   Shiga like toxin producing E coli (STEC) NOT DETECTED NOT DETECTED Final   Shigella/Enteroinvasive E coli (EIEC) NOT DETECTED NOT DETECTED Final   Cryptosporidium NOT DETECTED NOT DETECTED Final   Cyclospora cayetanensis NOT DETECTED NOT DETECTED Final   Entamoeba histolytica NOT DETECTED NOT DETECTED Final   Giardia lamblia NOT DETECTED NOT DETECTED Final   Adenovirus F40/41 NOT DETECTED NOT DETECTED Final   Astrovirus NOT DETECTED NOT DETECTED Final   Norovirus GI/GII NOT DETECTED NOT DETECTED Final   Rotavirus A NOT DETECTED NOT DETECTED Final   Sapovirus (I, II, IV, and V) NOT DETECTED NOT DETECTED Final    Comment: Performed at [facility], [address], [place],  [address]     Radiology Studies: No results found.   Marzetta Board, MD, PhD [facility]  Between 7 am - 7 pm I am available, please contact me via Amion (for emergencies) or Securechat (non urgent messages)  Between 7 pm - 7 am I am not available, please contact night coverage MD/APP via Amion

## 2021-12-17 NOTE — Plan of Care (Signed)

## 2021-12-17 NOTE — Evaluation (Signed)
Physical Therapy Evaluation Patient Details Name: Julie Montgomery MRN: [id number] DOB: 09-12-1935 Today's Date: 12/17/2021  History of Present Illness  86 year old female with history of prior C. difficile infections, basal cell carcinoma, bronchiectasis who comes into the hospital with diarrhea and weakness. Found to be dehydrated and stool positive for C-diff.  Clinical Impression  Pt admitted with above diagnosis.  Pt currently with functional limitations due to the deficits listed below (see PT Problem List). Pt will benefit from skilled PT to increase their independence and safety with mobility to allow discharge to the venue listed below.  Pt appears weak and timid with her movement.  At this time recommend HHPT, but she may progress well enough in acute care to not need it.  Husband reports son can help her enter house through the 3 steps without rail.        Recommendations for follow up therapy are one component of a multi-disciplinary discharge planning process, led by the attending physician.  Recommendations may be updated based on patient status, additional functional criteria and insurance authorization.  Follow Up Recommendations Home health PT (depending on progress in acute care)      Assistance Recommended at Discharge    Patient can return home with the following  A little help with walking and/or transfers    Equipment Recommendations Rolling walker (2 wheels)  Recommendations for Other Services       Functional Status Assessment Patient has had a recent decline in their functional status and demonstrates the ability to make significant improvements in function in a reasonable and predictable amount of time.     Precautions / Restrictions Precautions Precaution Comments: C-diff Restrictions Weight Bearing Restrictions: No      Mobility  Bed Mobility Overal bed mobility: Modified Independent                  Transfers Overall transfer level: Needs  assistance   Transfers: Sit to/from Stand Sit to Stand: Supervision           General transfer comment: stood from bed and toilet with S and cues for safety    Ambulation/Gait Ambulation/Gait assistance: Min guard Gait Distance (Feet): 100 Feet Assistive device: Rolling walker (2 wheels), None, 1 person hand held assist Gait Pattern/deviations: Decreased step length - right, Decreased step length - left       General Gait Details: Amb in room to bathroom initially with HHA. Gait training in hallway with/without RW.  She preferred the feeling of safety with the RW at the moment.  Pt able to self- correct posture. HR 84  Stairs            Wheelchair Mobility    Modified Rankin (Stroke Patients Only)       Balance Overall balance assessment: No apparent balance deficits (not formally assessed), Needs assistance         Standing balance support: During functional activity Standing balance-Leahy Scale: Good Standing balance comment: Able to wash hands at sink without support                             Pertinent Vitals/Pain Pain Assessment Pain Assessment: Faces Faces Pain Scale: No hurt    Home Living Family/patient expects to be discharged to:: Private residence Living Arrangements: Spouse/significant other Available Help at Discharge: Family Type of Home: House Home Access: Stairs to enter   [employer] of Steps: 3 at garage without rail, 5-6 in  the back with a rail   Home Layout: One level [place]: None      Prior Function Prior Level of Function : Independent/Modified Independent                     Hand Dominance        Extremity/Trunk Assessment   Upper Extremity Assessment Upper Extremity Assessment: Overall WFL for tasks assessed    Lower Extremity Assessment Lower Extremity Assessment: Generalized weakness    Cervical / Trunk Assessment Cervical / Trunk Assessment: Normal  Communication    Communication: No difficulties  Cognition Arousal/Alertness: Awake/alert Behavior During Therapy: WFL for tasks assessed/performed Overall Cognitive Status: Within Functional Limits for tasks assessed                                          General Comments      Exercises     Assessment/Plan    PT Assessment Patient needs continued PT services  PT Problem List Decreased strength;Decreased activity tolerance;Decreased mobility       PT Treatment Interventions Gait training;Functional mobility training;Therapeutic activities;Therapeutic exercise;Balance training    PT Goals (Current goals can be found in the Care Plan section)  Acute Rehab PT Goals Patient Stated Goal: get back to her PLOF PT Goal Formulation: With patient Time For Goal Achievement: 12/31/21 Potential to Achieve Goals: Good    Frequency Min 3X/week     Co-evaluation               AM-PAC PT "6 Clicks" Mobility  Outcome Measure Help needed turning from your back to your side while in a flat bed without using bedrails?: None Help needed moving from lying on your back to sitting on the side of a flat bed without using bedrails?: None Help needed moving to and from a bed to a chair (including a wheelchair)?: A Little Help needed standing up from a chair using your arms (e.g., wheelchair or bedside chair)?: A Little Help needed to walk in hospital room?: A Little Help needed climbing 3-5 steps with a railing? : A Little 6 Click Score: 20    End of Session Equipment Utilized During Treatment: Gait belt Activity Tolerance: Patient tolerated treatment well Patient left: in chair;with call bell/phone within reach;with nursing/sitter in room;with family/visitor present Nurse Communication: Mobility status PT Visit Diagnosis: Muscle weakness (generalized) (M62.81);Difficulty in walking, not elsewhere classified (R26.2)    Time: 0940-1003 PT Time Calculation (min) (ACUTE ONLY): 23  min   Charges:   PT Evaluation $PT Eval Low Complexity: 1 Low PT Treatments $Gait Training: 8-22 mins        Loletha Bertini L. Tamala Julian, [place]  12/17/2021   Galen Manila 12/17/2021, 10:13 AM

## 2021-12-17 NOTE — Plan of Care (Signed)

## 2021-12-18 ENCOUNTER — Other Ambulatory Visit (HOSPITAL_COMMUNITY): Payer: Self-pay

## 2021-12-18 ENCOUNTER — Ambulatory Visit: Payer: Medicare Other | Admitting: Internal Medicine

## 2021-12-18 DIAGNOSIS — A0472 Enterocolitis due to Clostridium difficile, not specified as recurrent: Secondary | ICD-10-CM | POA: Diagnosis not present

## 2021-12-18 LAB — BASIC METABOLIC PANEL
Anion gap: 3 — ABNORMAL LOW (ref 5–15)
BUN: 11 mg/dL (ref 8–23)
CO2: 29 mmol/L (ref 22–32)
Calcium: 8.7 mg/dL — ABNORMAL LOW (ref 8.9–10.3)
Chloride: 106 mmol/L (ref 98–111)
Creatinine, Ser: 0.58 mg/dL (ref 0.44–1.00)
GFR, Estimated: >60 mL/min (ref >60)
Glucose, Bld: 105 mg/dL — ABNORMAL HIGH (ref 70–99)
Potassium: 4.3 mmol/L (ref 3.5–5.1)
Sodium: 138 mmol/L (ref 135–145)

## 2021-12-18 MED ORDER — APIXABAN 2.5 MG PO TABS
2.5000 mg | ORAL_TABLET | Freq: Two times a day (BID) | ORAL | 0 refills | Status: DC
  Filled 2021-12-18: qty 60, 30d supply, fill #0

## 2021-12-18 MED ORDER — METOPROLOL TARTRATE 25 MG PO TABS
25.0000 mg | ORAL_TABLET | Freq: Two times a day (BID) | ORAL | 0 refills | Status: DC
  Filled 2021-12-18: qty 60, 30d supply, fill #0

## 2021-12-18 NOTE — Progress Notes (Signed)
Patient and husband, Julie Montgomery have been read and explained discharge instructions. Patient and husband have no further questions at this time. IV of the left arm has been removed. Site is clean, dry and intact. Eliquis teaching has been reiterated to patient and patient's husband.  [name], RN

## 2021-12-18 NOTE — TOC Progression Note (Signed)
Transition of Care [facility]) - Progression Note    Patient Details  Name: Julie Montgomery MRN: [id number] Date of Birth: April 22, 1936  [facility]) CM/SW Contact  Leeroy Cha, RN Phone Number: 12/18/2021, 8:05 AM  Clinical Narrative:    [id number]/ chart reviewed.  Following for toc needs.  Plan is to return home with self-care currently.   Expected Discharge Plan: Home/Self Care Barriers to Discharge: Continued Medical Work up  Expected Discharge Plan and Services Expected Discharge Plan: Home/Self Care   Discharge Planning Services: CM Consult   Living arrangements for the past 2 months: Single Family Home                                       Social Determinants of Health (SDOH) Interventions    Readmission Risk Interventions     No data to display

## 2021-12-18 NOTE — TOC Transition Note (Signed)
Transition of Care [facility]) - CM/SW Discharge Note   Patient Details  Name: Julie Montgomery MRN: [id number] Date of Birth: 06-26-1935  [facility] of Care [facility]) CM/SW Contact:  Leeroy Cha, RN Phone Number: 12/18/2021, 11:21 AM   Clinical Narrative:    [facility]hc request for p.t. accepted through [facility].  Caryl Pina is contact. Dcd to home with hhc services.   Final next level of care: [place] Barriers to Discharge: Barriers Resolved   Patient Goals and CMS Choice Patient states their goals for this hospitalization and ongoing recovery are:: to go home CMS Medicare.gov Compare Post Acute Care list provided to:: Patient Choice offered to / list presented to : Patient  Discharge Placement                       Discharge Plan and Services   Discharge Planning Services: CM Consult                      HH Arranged: PT [facility] Agency: [place] ([facility]) Date [facility] Agency Contacted: 12/18/21 Time Heard: 1120 Representative spoke with at [place]: [place] Determinants of Health ([place]) Interventions     Readmission Risk Interventions     No data to display

## 2021-12-18 NOTE — Progress Notes (Signed)
Mobility Specialist - Progress Note   12/18/21 1017  Mobility  HOB Elevated/Bed Position Self regulated  Activity Ambulated with assistance in hallway  Range of Motion/Exercises Active  Level of Assistance Modified independent, requires aide device or extra time  Assistive Device Front wheel walker  Distance Ambulated (ft) 250 ft  Activity Response Tolerated well  Transport method Ambulatory  $Mobility charge 1 Mobility   Pt received in bed and agreeable to mobility.  Pt to bed after session with all needs met.    [facility]

## 2021-12-18 NOTE — Discharge Summary (Signed)
Physician Discharge Summary  Julie Montgomery URK:[id number] DOB: 09/11/1935 DOA: 12/12/2021  PCP: Cassandria Anger, MD  Admit date: 12/12/2021 Discharge date: 12/18/2021  Admitted From: home Disposition:  home  Recommendations for Outpatient Follow-up:  Follow up with PCP in 1-2 weeks  Home Health: none Equipment/Devices: none  Discharge Condition: stable CODE STATUS: Full code Diet Orders (From admission, onward)     Start     Ordered   12/14/21 1429  Diet regular Room service appropriate? Yes; Fluid consistency: Thin  Diet effective now       Question Answer Comment  Room service appropriate? Yes   Fluid consistency: Thin      12/14/21 1428            HPI: Per admitting MD, 86 year old female with past medical history of prior C. difficile infection (2016 and 2018), basal cell carcinoma, bronchiectasis who presents to [facility] emergency department with complaints of diarrhea and weakness. Patient reports that she had presented to see her dermatologist in late July and had a precancerous lesion removed.  In the days/weeks that followed patient developed redness warmth and tenderness around the lesion and was prescribed Keflex in the outpatient setting due to concerns for possible early cellulitis.  Patient took her Keflex as instructed, completing her course of therapy on 8/13.  On 8/14, the patient reports that she woke up from sleep with abdominal cramping.  She went to the bathroom she began to experience bouts of brown watery diarrhea. Over the next 24 hours patient's abdominal cramping worsened.  Pain is moderate to severe in intensity and diffuse.  Patient has been experiencing associated generalized weakness and poor oral intake.  Patient continued to experience frequent bouts of watery diarrhea throughout the day and the patient eventually presented to [facility] emergency department for evaluation. Upon evaluation in the emergency department patient was  clinically found to be somewhat volume depleted with a mild hyponatremia.  CT imaging of the abdomen and pelvis was performed revealing submucosal edema and mucosal enhancement throughout the rectum.  Due to these findings and clinical evidence of volume depletion the patient was initiated on intravenous fluids, stool samples were collected to test for C. difficile and the hospitalist group was called.  Patient was excepted for transfer to [facility] long hospital for continued medical care.    Hospital Course / Discharge diagnoses: Principal Problem:   Clostridium difficile colitis Active Problems:   Dehydration with hyponatremia   Protein-calorie malnutrition, severe (HCC)   Paroxysmal atrial fibrillation with RVR (HCC)   C. difficile colitis     Principal problem C. difficile colitis, diarrhea-patient was admitted to the hospital with severe watery diarrhea going on for the past several days at home after she finished outpatient antibiotic.  This is her third episode in the last 2 years, she was placed on Dificid with significant improvement, her diarrhea is now resolved, she has no abdominal pain, tolerating a regular diet.  She will be discharged home in stable condition and will finish Dificid as an outpatient.   Active problems Paroxysmal A-fib with RVR-new onset 8/17, heart rates were in the 180s-190s.  Cardiology consulted.  Due to elevated CHA2DS2-VASc score of 5, recommending anticoagulation, she was on heparin and now transition to Eliquis.  She was briefly on amiodarone but discontinued once she converted to sinus, as she was bradycardic.  Currently on metoprolol and Eliquis.  2D echo showed normal EF.  Dyspnea-likely due to mild fluid overload, received Lasix and  now resolved.  Elevated LFTs-possibly transient poor perfusion in the setting of A-fib with RVR with rates into the 180s.  Right upper quadrant ultrasound fairly unremarkable.  LFTs improving Hyponatremia-mild, due to  dehydration.  Sodium normalized Hypokalemia-potassium normalized  Scalp lesion-status post removal by dermatology 3 weeks prior, completed a course of Keflex for concern for surrounding cellulitis.  Stable now  Sepsis ruled out   Discharge Instructions   Allergies as of 12/18/2021       Reactions   Lovastatin Other (See Comments)   Unknown reaction   Mometasone Furo-formoterol Fum Other (See Comments)   Loss of appetite, laryngitis    Prozac [fluoxetine Hcl] Other (See Comments)   Jumpy   Sulfa Antibiotics Other (See Comments)   Unknown reaction        Medication List     STOP taking these medications    SYSTANE OP       TAKE these medications    apixaban 2.5 MG Tabs tablet Commonly known as: ELIQUIS Take 1 tablet (2.5 mg total) by mouth 2 (two) times daily.   fidaxomicin 200 MG Tabs tablet Commonly known as: DIFICID Take 1 tablet by mouth 2 times daily.   metoprolol tartrate 25 MG tablet Commonly known as: LOPRESSOR Take 1 tablet (25 mg total) by mouth 2 (two) times daily.   VITAMIN C PO Take 1 tablet by mouth daily.   Vitamin D3 25 MCG (1000 UT) Caps Take 2,000 Units by mouth daily.        Follow-up Information     Rex Kras, DO Follow up on 01/12/2022.   Specialties: Cardiology, Vascular Surgery Why: 1pm Afib follow up  Please bring the caridac medication bottles at the next office visit to help facilitate a more accurate medication reconciliation. Contact information: [address] [place] [address] [phone]                 Consultations: Cardiology   Procedures/Studies:  DG CHEST PORT 1 VIEW  Result Date: 12/16/2021 CLINICAL DATA:  Dyspnea. EXAM: PORTABLE CHEST 1 VIEW COMPARISON:  01/29/2020 FINDINGS: 0537 hours. Lungs are hyperexpanded. Interstitial markings are diffusely coarsened with chronic features. Nodule in the right mid lung is stable, likely calcified granuloma. Cardiopericardial silhouette is  at upper limits of normal for size. Bones are diffusely demineralized. Telemetry leads overlie the chest. IMPRESSION: Hyperexpansion with chronic interstitial coarsening. No acute cardiopulmonary findings. Electronically Signed   By: Misty Stanley M.D.   On: 12/16/2021 06:25   US Abdomen Limited RUQ (LIVER/GB)  Result Date: 12/15/2021 CLINICAL DATA:  Elevated LFTs. EXAM: ULTRASOUND ABDOMEN LIMITED RIGHT UPPER QUADRANT COMPARISON:  CT abdomen/pelvis 12/12/2021 FINDINGS: Gallbladder: Prior cholecystectomy. Common bile duct: Diameter: 3 mm. Liver: No focal lesion identified. Within normal limits in parenchymal echogenicity. Portal vein is patent on color Doppler imaging with normal direction of blood flow towards the liver. Other: No free fluid. IMPRESSION: 1.  No acute findings. 2.  Prior cholecystectomy. Electronically Signed   By: Marin Olp M.D.   On: 12/15/2021 09:52   ECHOCARDIOGRAM COMPLETE  Result Date: 12/14/2021    ECHOCARDIOGRAM REPORT   Patient Name:   CANDY LEVERETT Date of Exam: 12/14/2021 Medical Rec #:  [id number]       Height:       62.0 in Accession #:    [id number]      Weight:       90.8 lb Date of Birth:  [birth year]      BSA:  1.367 m Patient Age:    21 years        BP:           147/92 mmHg Patient Gender: F               HR:           123 bpm. Exam Location:  Inpatient Procedure: 2D Echo, Color Doppler and Cardiac Doppler Indications:    A-fib  History:        Patient has no prior history of Echocardiogram examinations.                 Risk Factors:Dyslipidemia.  Sonographer:    Raquel Sarna Senior RDCS Referring Phys: [place]  1. Left ventricular ejection fraction, by estimation, is 55 to 60%. The left ventricle has normal function. The left ventricle has no regional wall motion abnormalities. Left ventricular diastolic function could not be evaluated.  2. Right ventricular systolic function is low normal. The right ventricular size is normal. There is  normal pulmonary artery systolic pressure. The estimated right ventricular systolic pressure is 24.0 mmHg.  3. The mitral valve is degenerative. Mild mitral valve regurgitation. No evidence of mitral stenosis.  4. The aortic valve is grossly normal. Aortic valve regurgitation is not visualized. Aortic valve sclerosis/calcification is present, without any evidence of aortic stenosis.  5. The inferior vena cava is normal in size with greater than 50% respiratory variability, suggesting right atrial pressure of 3 mmHg.  6. Rhythm strip during this exam demonstrates Afib RVR. Comparison(s): No prior Echocardiogram. FINDINGS  Left Ventricle: Left ventricular ejection fraction, by estimation, is 55 to 60%. The left ventricle has normal function. The left ventricle has no regional wall motion abnormalities. The left ventricular internal cavity size was normal in size. There is  no left ventricular hypertrophy. Left ventricular diastolic function could not be evaluated due to atrial fibrillation. Left ventricular diastolic function could not be evaluated. Right Ventricle: The right ventricular size is normal. No increase in right ventricular wall thickness. Right ventricular systolic function is low normal. There is normal pulmonary artery systolic pressure. The tricuspid regurgitant velocity is 2.51 m/s,  and with an assumed right atrial pressure of 3 mmHg, the estimated right ventricular systolic pressure is 97.3 mmHg. Left Atrium: Left atrial size was normal in size. Right Atrium: Right atrial size was normal in size. Pericardium: There is no evidence of pericardial effusion. Mitral Valve: The mitral valve is degenerative in appearance. There is moderate thickening of the mitral valve leaflet(s). Mildly decreased mobility of the mitral valve leaflets. Mild mitral annular calcification. Mild mitral valve regurgitation. No evidence of mitral valve stenosis. Tricuspid Valve: The tricuspid valve is grossly normal. Tricuspid  valve regurgitation is mild . No evidence of tricuspid stenosis. Aortic Valve: The aortic valve is grossly normal. Aortic valve regurgitation is not visualized. Aortic valve sclerosis/calcification is present, without any evidence of aortic stenosis. Pulmonic Valve: The pulmonic valve was not well visualized. Pulmonic valve regurgitation is trivial. No evidence of pulmonic stenosis. Aorta: The aortic root and ascending aorta are structurally normal, with no evidence of dilitation. Venous: The inferior vena cava is normal in size with greater than 50% respiratory variability, suggesting right atrial pressure of 3 mmHg. IAS/Shunts: The atrial septum is grossly normal. EKG: Rhythm strip during this exam demonstrates Afib RVR.  LEFT VENTRICLE PLAX 2D LVIDd:         3.80 cm LVIDs:  2.90 cm LV PW:         0.80 cm LV IVS:        0.70 cm LVOT diam:     1.80 cm LV SV:         31 LV SV Index:   23 LVOT Area:     2.54 cm  RIGHT VENTRICLE RV S prime:     11.10 cm/s TAPSE (M-mode): 1.4 cm LEFT ATRIUM             Index        RIGHT ATRIUM           Index LA diam:        2.90 cm 2.12 cm/m   RA Area:     13.80 cm LA Vol (A2C):   48.6 ml 35.56 ml/m  RA Volume:   33.80 ml  24.73 ml/m LA Vol (A4C):   30.2 ml 22.09 ml/m LA Biplane Vol: 38.3 ml 28.02 ml/m  AORTIC VALVE LVOT Vmax:   81.25 cm/s LVOT Vmean:  55.250 cm/s LVOT VTI:    0.122 m  AORTA Ao Root diam: 2.50 cm Ao Asc diam:  3.40 cm MR Peak grad:    73.6 mmHg    TRICUSPID VALVE MR Mean grad:    47.5 mmHg    TR Peak grad:   25.2 mmHg MR Vmax:         429.00 cm/s  TR Vmax:        251.00 cm/s MR Vmean:        321.5 cm/s MR PISA:         0.25 cm     SHUNTS MR PISA Eff ROA: 2 mm        Systemic VTI:  0.12 m MR PISA Radius:  0.20 cm      Systemic Diam: 1.80 cm Sunit Tolia DO Electronically signed by Rex Kras DO Signature Date/Time: 12/14/2021/2:40:35 PM    Final    CT Abdomen Pelvis W Contrast  Result Date: 12/12/2021 CLINICAL DATA:  Diarrhea and fever for 1 day.   86 year old female. EXAM: CT ABDOMEN AND PELVIS WITH CONTRAST TECHNIQUE: Multidetector CT imaging of the abdomen and pelvis was performed using the standard protocol following bolus administration of intravenous contrast. RADIATION DOSE REDUCTION: This exam was performed according to the departmental dose-optimization program which includes automated exposure control, adjustment of the mA and/or kV according to patient size and/or use of iterative reconstruction technique. CONTRAST:  17m OMNIPAQUE IOHEXOL 300 MG/ML  SOLN COMPARISON:  None Available. FINDINGS: Lower chest: Lung bases are clear. Hepatobiliary: No focal hepatic lesion. No biliary duct dilatation. Common bile duct is normal. Pancreas: Pancreas is normal. No ductal dilatation. No pancreatic inflammation. Spleen: Normal spleen Adrenals/urinary tract: Adrenal glands normal. Simple fluid attenuation cyst in the LEFT kidney measures 15 mm. Ureters and bladder normal. Stomach/Bowel: The stomach, duodenum, and small bowel normal. Ascending, transverse and descending colon normal. No bowel obstruction. Mild submucosal edema and mucosal enhancement of the rectosigmoid colon. (Coronal image 28/6 and axial image 64/2). Vascular/Lymphatic: Abdominal aorta is normal caliber with atherosclerotic calcification. There is no retroperitoneal or periportal lymphadenopathy. No pelvic lymphadenopathy. Reproductive: Post hysterectomy.  Adnexa unremarkable Other: No free fluid. Musculoskeletal: No aggressive osseous lesion. IMPRESSION: 1. Submucosal edema and mucosal enhancement through the rectum. Differential includes mild proctitis versus segmental colitis (inflammatory bowel disease, infectious colitis, or unlikely ischemia colitis). 2. No evidence of bowel obstruction. 3. No additional abnormality of the bowel. 4. 1.5 cm left Bosniak  1 benign simple cyst. No follow-up imaging is recommended. JACR 2018 Feb; 264-273, Management of the Incidental RenalMass on CT,  [employer] 2021; 814-848, Bosniak Classification of Cystic Renal Masses, Version 2019. 5. Electronically Signed   By: Suzy Bouchard M.D.   On: 12/12/2021 17:57     Subjective: - no chest pain, shortness of breath, no abdominal pain, nausea or vomiting.   Discharge Exam: BP 135/68 (BP Location: Right Arm)   Pulse 61   Temp 98.3 F (36.8 C) (Oral)   Resp 17   Ht '5\' 2"'$  (1.575 m)   Wt 41.2 kg   SpO2 95%   BMI 16.61 kg/m   General: Pt is alert, awake, not in acute distress Cardiovascular: RRR, S1/S2 +, no rubs, no gallops Respiratory: CTA bilaterally, no wheezing, no rhonchi Abdominal: Soft, NT, ND, bowel sounds + Extremities: no edema, no cyanosis   The results of significant diagnostics from this hospitalization (including imaging, microbiology, ancillary and laboratory) are listed below for reference.     Microbiology: Recent Results (from the past 240 hour(s))  C Difficile Quick Screen w PCR reflex     Status: Abnormal   Collection Time: 12/12/21  5:11 PM   Specimen: STOOL  Result Value Ref Range Status   C Diff antigen POSITIVE (A) NEGATIVE Final   C Diff toxin NEGATIVE NEGATIVE Final   C Diff interpretation Results are indeterminate. See PCR results.  Final    Comment: Performed at [facility], [place] [address], [place], [place] [address]  C. Diff by PCR, Reflexed     Status: Abnormal   Collection Time: 12/12/21  5:11 PM  Result Value Ref Range Status   Toxigenic C. Difficile by PCR POSITIVE (A) NEGATIVE Final    Comment: Positive for toxigenic C. difficile with little to no toxin production. Only treat if clinical presentation suggests symptomatic illness. CRITICAL RESULT CALLED TO, READ BACK BY AND VERIFIED WITH:  C/ ANDREW P., RN 12/13/21 0030 A. LAFRANCE  Performed at [facility], [place] [address], [place], [place] [address]   Gastrointestinal Panel by PCR , Stool     Status: None   Collection Time: 12/12/21  5:45 PM  Result Value Ref Range Status    Campylobacter species NOT DETECTED NOT DETECTED Final   Plesimonas shigelloides NOT DETECTED NOT DETECTED Final   Salmonella species NOT DETECTED NOT DETECTED Final   Yersinia enterocolitica NOT DETECTED NOT DETECTED Final   Vibrio species NOT DETECTED NOT DETECTED Final   Vibrio cholerae NOT DETECTED NOT DETECTED Final   Enteroaggregative E coli (EAEC) NOT DETECTED NOT DETECTED Final   Enteropathogenic E coli (EPEC) NOT DETECTED NOT DETECTED Final   Enterotoxigenic E coli (ETEC) NOT DETECTED NOT DETECTED Final   Shiga like toxin producing E coli (STEC) NOT DETECTED NOT DETECTED Final   Shigella/Enteroinvasive E coli (EIEC) NOT DETECTED NOT DETECTED Final   Cryptosporidium NOT DETECTED NOT DETECTED Final   Cyclospora cayetanensis NOT DETECTED NOT DETECTED Final   Entamoeba histolytica NOT DETECTED NOT DETECTED Final   Giardia lamblia NOT DETECTED NOT DETECTED Final   Adenovirus F40/41 NOT DETECTED NOT DETECTED Final   Astrovirus NOT DETECTED NOT DETECTED Final   Norovirus GI/GII NOT DETECTED NOT DETECTED Final   Rotavirus A NOT DETECTED NOT DETECTED Final   Sapovirus (I, II, IV, and V) NOT DETECTED NOT DETECTED Final    Comment: Performed at [facility], [address], [place], [place] [address]     Labs:  Basic Metabolic Panel: Recent Labs  Lab 12/13/21 0842 12/14/21 0701 12/15/21 0056 12/16/21 0521 12/18/21 0446  NA 137 134* 133* 134* 138  K 3.5 3.2* 3.4* 3.6 4.3  CL 105 100 103 105 106  CO2 24 25 21* 23 29  GLUCOSE 119* 94 115* 90 105*  BUN '18 9 20 16 11  '$ CREATININE 0.63 0.52 0.61 0.46 0.58  CALCIUM 8.6* 9.0 9.1 8.4* 8.7*  MG 1.8 1.8  --   --   --    Liver Function Tests: Recent Labs  Lab 12/12/21 1450 12/13/21 0842 12/14/21 0701 12/15/21 0056 12/16/21 0521  AST 22 34 83* 256* 114*  ALT 17 29 42 250* 177*  ALKPHOS 96 72 76 242* 201*  BILITOT 0.6 0.6 0.8 0.6 0.7  PROT 6.7 5.8* 6.3* 5.3* 5.3*  ALBUMIN 4.2 3.1* 3.4* 3.0* 2.8*   CBC: Recent Labs   Lab 12/12/21 1450 12/13/21 0842 12/14/21 0701 12/15/21 0056 12/16/21 0521  WBC 8.3 7.5 5.5 6.5 8.2  NEUTROABS  --  6.6 4.0  --   --   HGB 13.9 11.9* 13.4 12.2 11.2*  HCT 40.7 35.4* 39.2 36.4 33.2*  MCV 93.8 96.5 96.1 95.0 94.9  PLT 181 137* 155 178 154   CBG: No results for input(s): "GLUCAP" in the last 168 hours. Hgb A1c No results for input(s): "HGBA1C" in the last 72 hours. Lipid Profile Recent Labs    12/15/21 1128  CHOL 156  HDL 53  LDLCALC 86  TRIG 84  CHOLHDL 2.9   Thyroid function studies No results for input(s): "TSH", "T4TOTAL", "T3FREE", "THYROIDAB" in the last 72 hours.  Invalid input(s): "FREET3" Urinalysis    Component Value Date/Time   COLORURINE YELLOW 04/05/2021 1236   APPEARANCEUR CLEAR 04/05/2021 1236   LABSPEC 1.015 04/05/2021 1236   PHURINE 6.0 04/05/2021 1236   GLUCOSEU NEGATIVE 04/05/2021 1236   HGBUR NEGATIVE 04/05/2021 1236   [place] 04/05/2021 1236   KETONESUR NEGATIVE 04/05/2021 1236   PROTEINUR NEGATIVE 07/02/2019 1628   UROBILINOGEN 0.2 04/05/2021 1236   NITRITE NEGATIVE 04/05/2021 1236   LEUKOCYTESUR TRACE (A) 04/05/2021 1236    FURTHER DISCHARGE INSTRUCTIONS:   Get Medicines reviewed and adjusted: Please take all your medications with you for your next visit with your Primary MD   Laboratory/radiological data: Please request your Primary MD to go over all hospital tests and procedure/radiological results at the follow up, please ask your Primary MD to get all Hospital records sent to his/her office.   In some cases, they will be blood work, cultures and biopsy results pending at the time of your discharge. Please request that your primary care M.D. goes through all the records of your hospital data and follows up on these results.   Also Note the following: If you experience worsening of your admission symptoms, develop shortness of breath, life threatening emergency, suicidal or homicidal thoughts you must seek  medical attention immediately by calling 911 or calling your MD immediately  if symptoms less severe.   You must read complete instructions/literature along with all the possible adverse reactions/side effects for all the Medicines you take and that have been prescribed to you. Take any new Medicines after you have completely understood and accpet all the possible adverse reactions/side effects.    Do not drive when taking Pain medications or sleeping medications (Benzodaizepines)   Do not take more than prescribed Pain, Sleep and Anxiety Medications. It is not advisable to combine anxiety,sleep and pain medications without talking  with your primary care practitioner   Special Instructions: If you have smoked or chewed Tobacco  in the last 2 yrs please stop smoking, stop any regular Alcohol  and or any Recreational drug use.   Wear Seat belts while driving.   Please note: You were cared for by a hospitalist during your hospital stay. Once you are discharged, your primary care physician will handle any further medical issues. Please note that NO REFILLS for any discharge medications will be authorized once you are discharged, as it is imperative that you return to your primary care physician (or establish a relationship with a primary care physician if you do not have one) for your post hospital discharge needs so that they can reassess your need for medications and monitor your lab values.  Time coordinating discharge: 40 minutes  SIGNED:  Marzetta Board, MD, PhD 12/18/2021, 9:08 AM

## 2021-12-19 ENCOUNTER — Telehealth: Payer: Medicare Other | Admitting: Nurse Practitioner

## 2021-12-19 ENCOUNTER — Telehealth: Payer: Self-pay

## 2021-12-19 ENCOUNTER — Ambulatory Visit (INDEPENDENT_AMBULATORY_CARE_PROVIDER_SITE_OTHER): Payer: Medicare Other | Admitting: Emergency Medicine

## 2021-12-19 ENCOUNTER — Encounter: Payer: Self-pay | Admitting: Emergency Medicine

## 2021-12-19 VITALS — BP 144/70 | HR 54 | Temp 98.4°F | Ht 62.5 in | Wt 90.0 lb

## 2021-12-19 DIAGNOSIS — I4891 Unspecified atrial fibrillation: Secondary | ICD-10-CM

## 2021-12-19 DIAGNOSIS — A0472 Enterocolitis due to Clostridium difficile, not specified as recurrent: Secondary | ICD-10-CM | POA: Diagnosis not present

## 2021-12-19 DIAGNOSIS — I480 Paroxysmal atrial fibrillation: Secondary | ICD-10-CM | POA: Insufficient documentation

## 2021-12-19 DIAGNOSIS — Z09 Encounter for follow-up examination after completed treatment for conditions other than malignant neoplasm: Secondary | ICD-10-CM

## 2021-12-19 DIAGNOSIS — J479 Bronchiectasis, uncomplicated: Secondary | ICD-10-CM

## 2021-12-19 DIAGNOSIS — A498 Other bacterial infections of unspecified site: Secondary | ICD-10-CM | POA: Diagnosis not present

## 2021-12-19 NOTE — Assessment & Plan Note (Signed)
Stable. Asymptomatic.

## 2021-12-19 NOTE — Assessment & Plan Note (Signed)
Much improved. Able to eat and drink.  Denies nausea or vomiting. Afebrile. Continue and finish Dificid 200 mg twice a day as instructed

## 2021-12-19 NOTE — Patient Instructions (Signed)
Clostridioides Difficile Infection ?Clostridioides difficile infection, or C. diff, is an infection that is caused by C. diff germs (bacteria). ?This infection may happen after you take antibiotics that kill other germs and let C. diff germs grow. C. diff can be spread from person to person (is contagious). ?What are the causes? ?Taking certain antibiotics. ?Coming in contact with people, food, or things that have C. diff. ?What increases the risk? ?Taking certain antibiotics for a long time. ?Staying in a hospital or long-term care facility for a long time. ?Being age 65 or older. ?Having had C. diff before or been exposed to C. diff. ?Having a weak disease-fighting system (immune system). ?Taking medicines that treat stomach acid. ?Having serious health problems, including: ?Colon cancer. ?Inflammatory bowel disease (IBD). ?Having had a procedure or surgery on your digestive system. ?What are the signs or symptoms? ?Watery poop (diarrhea). ?Fever. ?Not feeling hungry. ?Feeling like you may vomit. ?Swelling, pain, cramps, or a tender belly. ?How is this treated? ?Treatment may include: ?Stopping the antibiotics that caused the C. diff infection. ?Taking antibiotics that kill C. diff. ?Placing poop from a healthy person into your colon (fecal transplant). ?Doing surgery to take out the infected part of the colon. ?Follow these instructions at home: ?Medicines ?Take over-the-counter and prescription medicines only as told by your doctor. ?Take antibiotic medicine as told by your doctor. Do not stop taking it even if you start to feel better. ?Do not take medicines to treat watery poop unless your doctor tells you to. ?Eating and drinking ? ?Follow instructions from your doctor about what to eat and drink. This may include eating bland foods in small amounts, such as: ?Bananas. ?Applesauce. ?Rice. ?Lean meats. ?Toast. ?Crackers. ?To prevent loss of fluid in your body (dehydration): ?Take in enough fluids to keep your  pee pale yellow. This includes water, ice chips, clear fruit juice with water added to it, or low-calorie sports drinks. ?Take an ORS (oral rehydration solution). This drink is sold in pharmacies and retail stores. ?Avoid milk, caffeine, and alcohol. ?General instructions ?Wash your hands often with soap and water. Do this for at least 20 seconds. ?Take a bath or shower every day. ?Return to your normal activities when your doctor says that it is safe. ?Keep all follow-up visits. ?How is this prevented? ?Personal hygiene ? ?Wash your hands often with soap and water. Do this for at least 20 seconds. ?Wash your hands before you cook and after you use the bathroom. ?Other people should wash their hands too, especially: ?People who live with you. ?People who visit you in a hospital or clinic. ?Contact precautions ?If you get watery poop while you are in the hospital or a long-term care facility, tell your doctor right away. ?When you visit someone in the hospital or a long-term care facility, wear a gown, gloves, or other protection. ?If possible: ?Stay away from people who have diarrhea. ?Use a separate bathroom if you are sick and live with other people. ?Clean environment ?Keep your home clean. ?Clean your home every day for at least a week after you leave the hospital. ?Clean surfaces that you touch every day. Use a product that has a 10% chlorine bleach solution. Be sure to: ?Read the label on your product to make sure that the product will kill the germs on your surfaces. ?Clean toilets and flush handles, bathtubs, sinks, doorknobs and handles, countertops, and work surfaces. ?If you are in the hospital, make sure the surfaces in your room are   cleaned each day. Tell someone right away if body fluids have splashed or spilled. ?Clothes and linens ?Wash clothes and linens using laundry soap that has chlorine bleach. Be sure to: ?Use powder soap instead of liquid. ?Clean your washing machine once a month. To do this,  turn on the hot setting with only soap in it. ?Contact a doctor if: ?Your symptoms do not get better or they get worse. ?Your symptoms go away and then come back. ?You have a fever. ?You have new symptoms. ?Get help right away if: ?Your belly is more tender or you have more pain. ?Your poop is mostly bloody. ?Your poop looks black. ?You vomit after you eat or drink. ?You have signs of not having enough fluids in your body. These include: ?Dark yellow pee, very little pee, or no pee. ?Cracked lips or dry mouth. ?No tears when you cry. ?Sunken eyes. ?Feeling sleepy. ?Feeling weak or dizzy. ?Summary ?C. diff infection is an infection that may happen after you take antibiotic medicines. ?Symptoms include watery poop, fever, not feeling hungry, or feeling like you may vomit. ?Treatment includes stopping the antibiotics that made you sick and taking antibiotics that kill the C. diff germs. Poop from a healthy person may also be placed into your colon. ?To prevent C. diff infectionfrom spreading, wash hands often with soap and water. Do this for at least 20 seconds. Keep your home clean. ?This information is not intended to replace advice given to you by your health care provider. Make sure you discuss any questions you have with your health care provider. ?Document Revised: 08/06/2019 Document Reviewed: 08/06/2019 ?Elsevier Patient Education ? 2023 Elsevier Inc. ? ?

## 2021-12-19 NOTE — Assessment & Plan Note (Signed)
Stable.  Sinus rhythm with regular ventricular rate on exam. Continue Eliquis 2.5 twice a day Fall precautions given.

## 2021-12-19 NOTE — Progress Notes (Signed)
Julie Montgomery 86 y.o.   Chief complaint: Hospital discharge follow-up  HISTORY OF PRESENT ILLNESS: This is a 86 y.o. female discharged from hospital yesterday.  Here today for follow-up Discharge summary as follows:  Physician Discharge Summary  Julie Montgomery ZTI:[id number] DOB: 1935/10/13 DOA: 12/12/2021   PCP: Cassandria Anger, MD   Admit date: 12/12/2021 Discharge date: 12/18/2021   Admitted From: home Disposition:  home   Recommendations for Outpatient Follow-up:  Follow up with PCP in 1-2 weeks   Home Health: none Equipment/Devices: none   Discharge Condition: stable CODE STATUS: Full code Diet Orders (From admission, onward)        Start     Ordered    12/14/21 1429   Diet regular Room service appropriate? Yes; Fluid consistency: Thin  Diet effective now       Question Answer Comment  Room service appropriate? Yes    Fluid consistency: Thin       12/14/21 1428                  HPI: Per admitting MD, 86 year old female with past medical history of prior C. difficile infection (2016 and 2018), basal cell carcinoma, bronchiectasis who presents to [facility] emergency department with complaints of diarrhea and weakness. Patient reports that she had presented to see her dermatologist in late July and had a precancerous lesion removed.  In the days/weeks that followed patient developed redness warmth and tenderness around the lesion and was prescribed Keflex in the outpatient setting due to concerns for possible early cellulitis.  Patient took her Keflex as instructed, completing her course of therapy on 8/13.  On 8/14, the patient reports that she woke up from sleep with abdominal cramping.  She went to the bathroom she began to experience bouts of brown watery diarrhea. Over the next 24 hours patient's abdominal cramping worsened.  Pain is moderate to severe in intensity and diffuse.  Patient has been experiencing associated generalized weakness and poor oral  intake.  Patient continued to experience frequent bouts of watery diarrhea throughout the day and the patient eventually presented to [facility] emergency department for evaluation. Upon evaluation in the emergency department patient was clinically found to be somewhat volume depleted with a mild hyponatremia.  CT imaging of the abdomen and pelvis was performed revealing submucosal edema and mucosal enhancement throughout the rectum.  Due to these findings and clinical evidence of volume depletion the patient was initiated on intravenous fluids, stool samples were collected to test for C. difficile and the hospitalist group was called.  Patient was excepted for transfer to [facility] long hospital for continued medical care.     Hospital Course / Discharge diagnoses: Principal Problem:   Clostridium difficile colitis Active Problems:   Dehydration with hyponatremia   Protein-calorie malnutrition, severe (HCC)   Paroxysmal atrial fibrillation with RVR (HCC)   C. difficile colitis       Principal problem C. difficile colitis, diarrhea-patient was admitted to the hospital with severe watery diarrhea going on for the past several days at home after she finished outpatient antibiotic.  This is her third episode in the last 2 years, she was placed on Dificid with significant improvement, her diarrhea is now resolved, she has no abdominal pain, tolerating a regular diet.  She will be discharged home in stable condition and will finish Dificid as an outpatient.   Active problems Paroxysmal A-fib with RVR-new onset 8/17, heart rates were in the 180s-190s.  Cardiology consulted.  Due to elevated CHA2DS2-VASc score of 5, recommending anticoagulation, she was on heparin and now transition to Eliquis.  She was briefly on amiodarone but discontinued once she converted to sinus, as she was bradycardic.  Currently on metoprolol and Eliquis.  2D echo showed normal EF.  Dyspnea-likely due to mild fluid overload,  received Lasix and now resolved.  Elevated LFTs-possibly transient poor perfusion in the setting of A-fib with RVR with rates into the 180s.  Right upper quadrant ultrasound fairly unremarkable.  LFTs improving Hyponatremia-mild, due to dehydration.  Sodium normalized Hypokalemia-potassium normalized  Scalp lesion-status post removal by dermatology 3 weeks prior, completed a course of Keflex for concern for surrounding cellulitis.  Stable now   Sepsis ruled out   HPI   Prior to Admission medications   Medication Sig Start Date End Date Taking? Authorizing Provider  apixaban (ELIQUIS) 2.5 MG TABS tablet Take 1 tablet (2.5 mg total) by mouth 2 (two) times daily. 12/18/21  Yes Caren Griffins, MD  Ascorbic Acid (VITAMIN C PO) Take 1 tablet by mouth daily.   Yes [provider]  Cholecalciferol (VITAMIN D3) 1000 UNITS CAPS Take 2,000 Units by mouth daily.   Yes [provider]  fidaxomicin (DIFICID) 200 MG TABS tablet Take 1 tablet by mouth 2 times daily. 12/15/21  Yes Caren Griffins, MD  metoprolol tartrate (LOPRESSOR) 25 MG tablet Take 1 tablet (25 mg total) by mouth 2 (two) times daily. 12/18/21  Yes Caren Griffins, MD    Allergies  Allergen Reactions   Lovastatin Other (See Comments)    Unknown reaction   Mometasone Furo-Formoterol Fum Other (See Comments)    Loss of appetite, laryngitis    Prozac [Fluoxetine Hcl] Other (See Comments)    Jumpy   Sulfa Antibiotics Other (See Comments)    Unknown reaction    Patient Active Problem List   Diagnosis Date Noted   Paroxysmal atrial fibrillation with RVR ([place]) 12/14/2021   C. difficile colitis 12/14/2021   Protein-calorie malnutrition, severe ([place]) 12/13/2021   Dehydration with hyponatremia 12/12/2021   Wound infection following procedure 11/29/2021   Breast lump 08/15/2021   History of hormone replacement therapy 08/15/2021   Hyperlipidemia 08/15/2021   Osteopenia 08/15/2021   Sensorineural hearing loss  (SNHL) of both ears 08/15/2021   Underweight 03/31/2021   Statin myopathy 03/09/2021   Achilles tendon disorder, right 02/14/2021   Memory problem 01/19/2020   Ganglion cyst of left foot 01/11/2020   Mass of foot 12/14/2019   Gastroenteritis 03/23/2019   IBS (irritable bowel syndrome) 01/19/2019   Osteoporosis 01/13/2019   Coronary atherosclerosis 06/11/2018   Sternoclavicular joint pain, right 06/12/2017   Left ulnar fracture 09/06/2016   Post-nasal drip 05/02/2016   Carotid bruit 02/17/2016   Stress at home 10/18/2015   Swelling of foot joint 10/14/2014   Clostridium difficile colitis 10/04/2014   Diarrhea 10/04/2014   Left hand weakness 08/10/2014   Numbness of left hand 04/19/2014   Lipoma of arm 01/23/2012   Well adult exam 04/03/2011   Edema 02/13/2011   Shoulder pain, right 01/30/2011   Bronchiectasis ([place]) 01/23/2011   Vertigo 09/13/2010   Allergic rhinitis 09/13/2010   CONSTIPATION 03/22/2010   LUMBAGO 03/22/2010   URINARY FREQUENCY, CHRONIC 03/22/2010   Situational mixed anxiety and depressive disorder 02/25/2008   Cough 02/17/2008   Anxiety disorder 02/07/2007   INSOMNIA-SLEEP DISORDER-UNSPEC 02/07/2007   Weight loss 02/07/2007   Dyslipidemia 11/21/2006   OSTEOPENIA 11/21/2006   ALLERGY 11/21/2006  Past Medical History:  Diagnosis Date   Adenomatous polyp of colon 2020   Allergy    Anxiety    Basal cell carcinoma (BCC)    Cataract    Depression    Helicobacter pylori gastritis    Osteopenia    Pneumonia    hx of 03/2008   Stroke [facility])    TIA long time ago    Past Surgical History:  Procedure Laterality Date   APPENDECTOMY     BREAST LUMPECTOMY     right   CATARACT EXTRACTION W/ INTRAOCULAR LENS  IMPLANT, BILATERAL Bilateral    CESAREAN SECTION     CHOLECYSTECTOMY     MOHS SURGERY     PARTIAL HYSTERECTOMY     ROTATOR CUFF REPAIR  2012   SKIN GRAFT     teeth remmoved      Social History   Socioeconomic History   Marital status:  Married    Spouse name: Not on file   Number of children: 4   Years of education: Not on file   Highest education level: Not on file  Occupational History   Occupation: Oceanographer  Tobacco Use   Smoking status: Never   Smokeless tobacco: Never  Vaping Use   Vaping Use: Never used  Substance and Sexual Activity   Alcohol use: No   Drug use: No   Sexual activity: Not Currently  Other Topics Concern   Not on file  Social History Narrative   Married, Retired, she was looking after her GGD, now in court battle   Social Determinants of Health   Financial Resource Strain: Low Risk  (02/27/2021)   Overall Financial Resource Strain (CARDIA)    Difficulty of Paying Living Expenses: Not hard at all  Food Insecurity: No Food Insecurity (02/27/2021)   Hunger Vital Sign    Worried About Running Out of Food in the Last Year: Never true    Ran Out of Food in the Last Year: Never true  Transportation Needs: No Transportation Needs (02/27/2021)   PRAPARE - Hydrologist (Medical): No    Lack of Transportation (Non-Medical): No  Physical Activity: Inactive (02/27/2021)   Exercise Vital Sign    Days of Exercise per Week: 0 days    Minutes of Exercise per Session: 0 min  Stress: Stress Concern Present (02/27/2021)   [place]    Feeling of Stress : Very much  Social Connections: Socially Integrated (02/27/2021)   Social Connection and Isolation Panel [NHANES]    Frequency of Communication with Friends and Family: More than three times a week    Frequency of Social Gatherings with Friends and Family: More than three times a week    Attends Religious Services: More than 4 times per year    Active Member of [employer] or Organizations: Yes    Attends Music therapist: More than 4 times per year    Marital Status: Married  Human resources officer Violence: Not At Risk (02/27/2021)    Humiliation, Afraid, Rape, and Kick questionnaire    Fear of Current or Ex-Partner: No    Emotionally Abused: No    Physically Abused: No    Sexually Abused: No    Family History  Problem Relation Age of Onset   Diabetes Sister    Cancer Sister        lung ca   Coronary artery disease Brother    Dementia Brother  Cancer Brother        lung ca   Stomach cancer Paternal Grandmother    Colon cancer Son    Breast cancer Neg Hx    Esophageal cancer Neg Hx    Rectal cancer Neg Hx      Review of Systems  Constitutional: Negative.  Negative for chills and fever.  HENT: Negative.  Negative for congestion and sore throat.   Respiratory: Negative.  Negative for cough and shortness of breath.   Cardiovascular: Negative.  Negative for chest pain and palpitations.  Gastrointestinal:  Negative for abdominal pain, diarrhea, nausea and vomiting.  Skin: Negative.  Negative for rash.  Neurological: Negative.  Negative for dizziness and headaches.  All other systems reviewed and are negative.   Today's Vitals   12/19/21 1332  BP: (!) 144/70  Pulse: (!) 54  Temp: 98.4 F (36.9 C)  TempSrc: Temporal  SpO2: 93%  Weight: 90 lb (40.8 kg)  Height: 5' 2.5" (1.588 m)   Body mass index is 16.2 kg/m.  Physical Exam Vitals reviewed.  Constitutional:      Appearance: Normal appearance.  HENT:     Head: Normocephalic.     Mouth/Throat:     Mouth: Mucous membranes are moist.     Pharynx: Oropharynx is clear.  Eyes:     Extraocular Movements: Extraocular movements intact.     Conjunctiva/sclera: Conjunctivae normal.     Pupils: Pupils are equal, round, and reactive to light.  Cardiovascular:     Rate and Rhythm: Normal rate and regular rhythm.     Pulses: Normal pulses.     Heart sounds: Normal heart sounds.  Pulmonary:     Effort: Pulmonary effort is normal.     Breath sounds: Normal breath sounds.  Abdominal:     Palpations: Abdomen is soft.     Tenderness: There is no  abdominal tenderness.  Musculoskeletal:     Cervical back: No tenderness.  Lymphadenopathy:     Cervical: No cervical adenopathy.  Skin:    General: Skin is warm and dry.     Capillary Refill: Capillary refill takes less than 2 seconds.  Neurological:     General: No focal deficit present.     Mental Status: She is alert and oriented to person, place, and time.  Psychiatric:        Mood and Affect: Mood normal.        Behavior: Behavior normal.      ASSESSMENT & PLAN: A total of 44 minutes was spent with the patient and counseling/coordination of care regarding preparing for this visit, review of recent hospital discharge summary, review of multiple chronic medical problems and their management, review of all medications, review of most recent blood work results, prognosis, documentation and need for follow-up with PCP in 1 to 2 weeks.  Problem List Items Addressed This Visit       Cardiovascular and Mediastinum   New onset atrial fibrillation (HCC)    Stable.  Sinus rhythm with regular ventricular rate on exam. Continue Eliquis 2.5 twice a day Fall precautions given.        Respiratory   Bronchiectasis (HCC)    Stable.  Asymptomatic.        Digestive   Clostridium difficile colitis    Much improved. Able to eat and drink.  Denies nausea or vomiting. Afebrile. Continue and finish Dificid 200 mg twice a day as instructed        Other   Clostridioides difficile  infection   Other Visit Diagnoses     Hospital discharge follow-up    -  Primary      Patient Instructions  Clostridioides Difficile Infection Clostridioides difficile infection, or C. diff, is an infection that is caused by C. diff germs (bacteria). This infection may happen after you take antibiotics that kill other germs and let C. diff germs grow. C. diff can be spread from person to person (is contagious). What are the causes? Taking certain antibiotics. Coming in contact with people, food, or  things that have C. diff. What increases the risk? Taking certain antibiotics for a long time. Staying in a hospital or long-term care facility for a long time. Being age 81 or older. Having had C. diff before or been exposed to C. diff. Having a weak disease-fighting system (immune system). Taking medicines that treat stomach acid. Having serious health problems, including: Colon cancer. Inflammatory bowel disease (IBD). Having had a procedure or surgery on your digestive system. What are the signs or symptoms? Watery poop (diarrhea). Fever. Not feeling hungry. Feeling like you may vomit. Swelling, pain, cramps, or a tender belly. How is this treated? Treatment may include: Stopping the antibiotics that caused the C. diff infection. Taking antibiotics that kill C. diff. Placing poop from a healthy person into your colon (fecal transplant). Doing surgery to take out the infected part of the colon. Follow these instructions at home: Medicines Take over-the-counter and prescription medicines only as told by your doctor. Take antibiotic medicine as told by your doctor. Do not stop taking it even if you start to feel better. Do not take medicines to treat watery poop unless your doctor tells you to. Eating and drinking  Follow instructions from your doctor about what to eat and drink. This may include eating bland foods in small amounts, such as: Bananas. Applesauce. Rice. Lean meats. Toast. Crackers. To prevent loss of fluid in your body (dehydration): Take in enough fluids to keep your pee pale yellow. This includes water, ice chips, clear fruit juice with water added to it, or low-calorie sports drinks. Take an ORS (oral rehydration solution). This drink is sold in pharmacies and retail stores. Avoid milk, caffeine, and alcohol. General instructions Wash your hands often with soap and water. Do this for at least 20 seconds. Take a bath or shower every day. Return to your  normal activities when your doctor says that it is safe. Keep all follow-up visits. How is this prevented? Personal hygiene  Wash your hands often with soap and water. Do this for at least 20 seconds. Wash your hands before you cook and after you use the bathroom. Other people should wash their hands too, especially: People who live with you. People who visit you in a hospital or clinic. Contact precautions If you get watery poop while you are in the hospital or a long-term care facility, tell your doctor right away. When you visit someone in the hospital or a long-term care facility, wear a gown, gloves, or other protection. If possible: Stay away from people who have diarrhea. Use a separate bathroom if you are sick and live with other people. Clean environment Keep your home clean. Clean your home every day for at least a week after you leave the hospital. Clean surfaces that you touch every day. Use a product that has a 10% chlorine bleach solution. Be sure to: Read the label on your product to make sure that the product will kill the germs on your surfaces.  Clean toilets and flush handles, bathtubs, sinks, doorknobs and handles, countertops, and work surfaces. If you are in the hospital, make sure the surfaces in your room are cleaned each day. Tell someone right away if body fluids have splashed or spilled. Clothes and linens Wash clothes and linens using laundry soap that has chlorine bleach. Be sure to: Use powder soap instead of liquid. Clean your washing machine once a month. To do this, turn on the hot setting with only soap in it. Contact a doctor if: Your symptoms do not get better or they get worse. Your symptoms go away and then come back. You have a fever. You have new symptoms. Get help right away if: Your belly is more tender or you have more pain. Your poop is mostly bloody. Your poop looks black. You vomit after you eat or drink. You have signs of not having  enough fluids in your body. These include: Dark yellow pee, very little pee, or no pee. Cracked lips or dry mouth. No tears when you cry. Sunken eyes. Feeling sleepy. Feeling weak or dizzy. Summary C. diff infection is an infection that may happen after you take antibiotic medicines. Symptoms include watery poop, fever, not feeling hungry, or feeling like you may vomit. Treatment includes stopping the antibiotics that made you sick and taking antibiotics that kill the C. diff germs. Poop from a healthy person may also be placed into your colon. To prevent C. diff infectionfrom spreading, wash hands often with soap and water. Do this for at least 20 seconds. Keep your home clean. This information is not intended to replace advice given to you by your health care provider. Make sure you discuss any questions you have with your health care provider. Document Revised: 08/06/2019 Document Reviewed: 08/06/2019 Elsevier Patient Education  [place], MD [facility]

## 2021-12-19 NOTE — Telephone Encounter (Signed)
Transition Care Management Unsuccessful Follow-up Telephone Call  Date of discharge and from where:  [place] 12-18-21 Dx: Otho Darner  Attempts:  1st Attempt  Reason for unsuccessful TCM follow-up call:  Unable to leave message  Transition Care Management Follow-up Telephone Call Date of discharge and from where: [place] 12-18-21 Dx: C-diff How have you been since you were released from the hospital? Doing good  Any questions or concerns? No  Items Reviewed: Did the pt receive and understand the discharge instructions provided? Yes  Medications obtained and verified? Yes  Other? No  Any new allergies since your discharge? No  Dietary orders reviewed? Yes Do you have support at home? Yes   Home Care and Equipment/Supplies: Were home health services ordered? Yes, PT If so, what is the name of the agency? [facility] Has the agency set up a time to come to the patient's home? yes Were any new equipment or medical supplies ordered?  No What is the name of the medical supply agency? [facility] Were you able to get the supplies/equipment? not applicable Do you have any questions related to the use of the equipment or supplies? No  Functional Questionnaire: (I = Independent and D = Dependent) ADLs: I  Bathing/Dressing- I  Meal Prep- I  Eating- I  Maintaining continence- I  Transferring/Ambulation- I  Managing Meds- I  Follow up appointments reviewed:  PCP Hospital f/u appt confirmed? Yes  Scheduled to see Dr Alain Marion on 12-26-21 @ [facility] f/u appt confirmed? No  . Are transportation arrangements needed? No  If their condition worsens, is the pt aware to call PCP or go to the Emergency Dept.? Yes Was the patient provided with contact information for the PCP's office or ED? Yes Was to pt encouraged to call back with questions or concerns? Yes

## 2021-12-19 NOTE — Progress Notes (Signed)
Virtual Visit Consent   Julie Montgomery, you are scheduled for a virtual visit with a [place] provider today. Just as with appointments in the office, your consent must be obtained to participate. Your consent will be active for this visit and any virtual visit you may have with one of our providers in the next 365 days. If you have a MyChart account, a copy of this consent can be sent to you electronically.  As this is a virtual visit, video technology does not allow for your provider to perform a traditional examination. This may limit your provider's ability to fully assess your condition. If your provider identifies any concerns that need to be evaluated in person or the need to arrange testing (such as labs, EKG, etc.), we will make arrangements to do so. Although advances in technology are sophisticated, we cannot ensure that it will always work on either your end or our end. If the connection with a video visit is poor, the visit may have to be switched to a telephone visit. With either a video or telephone visit, we are not always able to ensure that we have a secure connection.  By engaging in this virtual visit, you consent to the provision of healthcare and authorize for your insurance to be billed (if applicable) for the services provided during this visit. Depending on your insurance coverage, you may receive a charge related to this service.  I need to obtain your verbal consent now. Are you willing to proceed with your visit today? Julie Montgomery has provided verbal consent on 12/19/2021 for a virtual visit (telephone). Julie Schneiders, FNP  Unable to connect over video- continued apt via telephone  Date: 12/19/2021 10:05 AM  Virtual Visit via Video Note   I, Julie Montgomery, connected with  Julie Montgomery  ([id number], 18-Sep-1935) on 12/19/21 at 10:00 AM EDT by a video-enabled telemedicine application and verified that I am speaking with the correct person using two  identifiers.  Location: Patient: Virtual Visit Location Patient: Home Provider: Virtual Visit Location Provider: Home Office   I discussed the limitations of evaluation and management by telemedicine and the availability of in person appointments. The patient expressed understanding and agreed to proceed.    History of Present Illness: Julie Montgomery is a 86 y.o. who identifies as a female who was assigned female at birth, and is being seen today for follow up after hospital discharge yesterday.   Hospitalized at [facility] for C. Diff and new onset Afib appeared to leave the hospital in [place] [place]  She noted swelling in her feet today that is new.  On review of hospital chart she was receiving IV Lasix while in patient.   New medications from hospital discharge: Eliquis, Metoprolol and Dificid   She is also somewhat short of breath today when she woke up but is feeling back to her baseline at this point with breathing.   Next apt is with Cardiology 9/15 Missed PCP apt earlier this week due to hospital visit.    Problems:  Patient Active Problem List   Diagnosis Date Noted   Paroxysmal atrial fibrillation with RVR ([place]) 12/14/2021   C. difficile colitis 12/14/2021   Protein-calorie malnutrition, severe ([place]) 12/13/2021   Dehydration with hyponatremia 12/12/2021   Wound infection following procedure 11/29/2021   Breast lump 08/15/2021   History of hormone replacement therapy 08/15/2021   Hyperlipidemia 08/15/2021   Osteopenia 08/15/2021   Sensorineural hearing loss (SNHL) of both ears 08/15/2021  Underweight 03/31/2021   Statin myopathy 03/09/2021   Achilles tendon disorder, right 02/14/2021   Memory problem 01/19/2020   Ganglion cyst of left foot 01/11/2020   Mass of foot 12/14/2019   Gastroenteritis 03/23/2019   IBS (irritable bowel syndrome) 01/19/2019   Osteoporosis 01/13/2019   Coronary atherosclerosis 06/11/2018   Sternoclavicular joint pain, right 06/12/2017    Left ulnar fracture 09/06/2016   Post-nasal drip 05/02/2016   Carotid bruit 02/17/2016   Stress at home 10/18/2015   Swelling of foot joint 10/14/2014   Clostridium difficile colitis 10/04/2014   Diarrhea 10/04/2014   Left hand weakness 08/10/2014   Numbness of left hand 04/19/2014   Lipoma of arm 01/23/2012   Well adult exam 04/03/2011   Edema 02/13/2011   Shoulder pain, right 01/30/2011   Bronchiectasis ([place]) 01/23/2011   Vertigo 09/13/2010   Allergic rhinitis 09/13/2010   CONSTIPATION 03/22/2010   LUMBAGO 03/22/2010   URINARY FREQUENCY, CHRONIC 03/22/2010   Situational mixed anxiety and depressive disorder 02/25/2008   Cough 02/17/2008   Anxiety disorder 02/07/2007   INSOMNIA-SLEEP DISORDER-UNSPEC 02/07/2007   Weight loss 02/07/2007   Dyslipidemia 11/21/2006   OSTEOPENIA 11/21/2006   ALLERGY 11/21/2006    Allergies:  Allergies  Allergen Reactions   Lovastatin Other (See Comments)    Unknown reaction   Mometasone Furo-Formoterol Fum Other (See Comments)    Loss of appetite, laryngitis    Prozac [Fluoxetine Hcl] Other (See Comments)    Jumpy   Sulfa Antibiotics Other (See Comments)    Unknown reaction   Medications:  Current Outpatient Medications:    apixaban (ELIQUIS) 2.5 MG TABS tablet, Take 1 tablet (2.5 mg total) by mouth 2 (two) times daily., Disp: 60 tablet, Rfl: 0   Ascorbic Acid (VITAMIN C PO), Take 1 tablet by mouth daily., Disp: , Rfl:    Cholecalciferol (VITAMIN D3) 1000 UNITS CAPS, Take 2,000 Units by mouth daily., Disp: , Rfl:    fidaxomicin (DIFICID) 200 MG TABS tablet, Take 1 tablet by mouth 2 times daily., Disp: 20 tablet, Rfl: 0   metoprolol tartrate (LOPRESSOR) 25 MG tablet, Take 1 tablet (25 mg total) by mouth 2 (two) times daily., Disp: 60 tablet, Rfl: 0  Observations/Objective: Telephone visit only  No labored breathing.  Speech is clear and coherent with logical content.  Patient is alert and oriented at baseline.    Assessment and  Plan: Spoke with PCP's office after phone visit she seems appropriate for in person follow up with office visit to evaluate edema   1:20pm PCP appointment today relayed information to patient and husband they are agreeable with plan   Follow Up Instructions: I discussed the assessment and treatment plan with the patient. The patient was provided an opportunity to ask questions and all were answered. The patient agreed with the plan and demonstrated an understanding of the instructions.  A copy of instructions were sent to the patient via MyChart unless otherwise noted below.    The patient was advised to call back or seek an in-person evaluation if the symptoms worsen or if the condition fails to improve as anticipated.  Time:  I spent 15 minutes with the patient via telehealth technology discussing the above problems/concerns.    Julie Schneiders, FNP

## 2021-12-20 DIAGNOSIS — E43 Unspecified severe protein-calorie malnutrition: Secondary | ICD-10-CM | POA: Diagnosis not present

## 2021-12-20 DIAGNOSIS — I2510 Atherosclerotic heart disease of native coronary artery without angina pectoris: Secondary | ICD-10-CM | POA: Diagnosis not present

## 2021-12-20 DIAGNOSIS — F32A Depression, unspecified: Secondary | ICD-10-CM | POA: Diagnosis not present

## 2021-12-20 DIAGNOSIS — A0471 Enterocolitis due to Clostridium difficile, recurrent: Secondary | ICD-10-CM | POA: Diagnosis not present

## 2021-12-20 DIAGNOSIS — I480 Paroxysmal atrial fibrillation: Secondary | ICD-10-CM | POA: Diagnosis not present

## 2021-12-20 DIAGNOSIS — D126 Benign neoplasm of colon, unspecified: Secondary | ICD-10-CM | POA: Diagnosis not present

## 2021-12-20 DIAGNOSIS — F419 Anxiety disorder, unspecified: Secondary | ICD-10-CM | POA: Diagnosis not present

## 2021-12-20 DIAGNOSIS — Z9181 History of falling: Secondary | ICD-10-CM | POA: Diagnosis not present

## 2021-12-20 DIAGNOSIS — Z8673 Personal history of transient ischemic attack (TIA), and cerebral infarction without residual deficits: Secondary | ICD-10-CM | POA: Diagnosis not present

## 2021-12-20 DIAGNOSIS — I700 Atherosclerosis of aorta: Secondary | ICD-10-CM | POA: Diagnosis not present

## 2021-12-20 DIAGNOSIS — J479 Bronchiectasis, uncomplicated: Secondary | ICD-10-CM | POA: Diagnosis not present

## 2021-12-20 DIAGNOSIS — Z7901 Long term (current) use of anticoagulants: Secondary | ICD-10-CM | POA: Diagnosis not present

## 2021-12-20 DIAGNOSIS — M8580 Other specified disorders of bone density and structure, unspecified site: Secondary | ICD-10-CM | POA: Diagnosis not present

## 2021-12-24 ENCOUNTER — Emergency Department (HOSPITAL_BASED_OUTPATIENT_CLINIC_OR_DEPARTMENT_OTHER): Payer: Medicare Other

## 2021-12-24 ENCOUNTER — Other Ambulatory Visit: Payer: Self-pay

## 2021-12-24 ENCOUNTER — Emergency Department (HOSPITAL_BASED_OUTPATIENT_CLINIC_OR_DEPARTMENT_OTHER)
Admission: EM | Admit: 2021-12-24 | Discharge: 2021-12-24 | Disposition: A | Discharge status: Home / Self Care | Payer: Medicare Other | Attending: Emergency Medicine | Admitting: Emergency Medicine

## 2021-12-24 ENCOUNTER — Encounter (HOSPITAL_BASED_OUTPATIENT_CLINIC_OR_DEPARTMENT_OTHER): Payer: Self-pay | Admitting: Emergency Medicine

## 2021-12-24 DIAGNOSIS — R600 Localized edema: Secondary | ICD-10-CM | POA: Insufficient documentation

## 2021-12-24 DIAGNOSIS — Z20822 Contact with and (suspected) exposure to covid-19: Secondary | ICD-10-CM | POA: Diagnosis not present

## 2021-12-24 DIAGNOSIS — I700 Atherosclerosis of aorta: Secondary | ICD-10-CM | POA: Insufficient documentation

## 2021-12-24 DIAGNOSIS — J90 Pleural effusion, not elsewhere classified: Secondary | ICD-10-CM | POA: Insufficient documentation

## 2021-12-24 DIAGNOSIS — Z8619 Personal history of other infectious and parasitic diseases: Secondary | ICD-10-CM | POA: Insufficient documentation

## 2021-12-24 DIAGNOSIS — I4891 Unspecified atrial fibrillation: Secondary | ICD-10-CM | POA: Insufficient documentation

## 2021-12-24 DIAGNOSIS — R109 Unspecified abdominal pain: Secondary | ICD-10-CM | POA: Diagnosis not present

## 2021-12-24 DIAGNOSIS — R079 Chest pain, unspecified: Secondary | ICD-10-CM | POA: Diagnosis not present

## 2021-12-24 DIAGNOSIS — R0789 Other chest pain: Secondary | ICD-10-CM

## 2021-12-24 DIAGNOSIS — Z79899 Other long term (current) drug therapy: Secondary | ICD-10-CM | POA: Diagnosis not present

## 2021-12-24 DIAGNOSIS — R918 Other nonspecific abnormal finding of lung field: Secondary | ICD-10-CM | POA: Diagnosis not present

## 2021-12-24 DIAGNOSIS — R609 Edema, unspecified: Secondary | ICD-10-CM

## 2021-12-24 DIAGNOSIS — Z85828 Personal history of other malignant neoplasm of skin: Secondary | ICD-10-CM | POA: Diagnosis not present

## 2021-12-24 DIAGNOSIS — R0602 Shortness of breath: Secondary | ICD-10-CM | POA: Diagnosis not present

## 2021-12-24 DIAGNOSIS — Z7901 Long term (current) use of anticoagulants: Secondary | ICD-10-CM | POA: Diagnosis not present

## 2021-12-24 DIAGNOSIS — J9811 Atelectasis: Secondary | ICD-10-CM | POA: Diagnosis not present

## 2021-12-24 LAB — COMPREHENSIVE METABOLIC PANEL
ALT: 57 U/L — ABNORMAL HIGH (ref 0–44)
AST: 32 U/L (ref 15–41)
Albumin: 3.6 g/dL (ref 3.5–5.0)
Alkaline Phosphatase: 192 U/L — ABNORMAL HIGH (ref 38–126)
Anion gap: 9 (ref 5–15)
BUN: 22 mg/dL (ref 8–23)
CO2: 27 mmol/L (ref 22–32)
Calcium: 9.2 mg/dL (ref 8.9–10.3)
Chloride: 97 mmol/L — ABNORMAL LOW (ref 98–111)
Creatinine, Ser: 0.53 mg/dL (ref 0.44–1.00)
GFR, Estimated: >60 mL/min (ref >60)
Glucose, Bld: 99 mg/dL (ref 70–99)
Potassium: 4.9 mmol/L (ref 3.5–5.1)
Sodium: 133 mmol/L — ABNORMAL LOW (ref 135–145)
Total Bilirubin: 0.5 mg/dL (ref 0.3–1.2)
Total Protein: 6.1 g/dL — ABNORMAL LOW (ref 6.5–8.1)

## 2021-12-24 LAB — RESP PANEL BY RT-PCR (FLU A&B, COVID) ARPGX2
Influenza A by PCR: NEGATIVE
Influenza B by PCR: NEGATIVE
SARS Coronavirus 2 by RT PCR: NEGATIVE

## 2021-12-24 LAB — CBC WITH DIFFERENTIAL/PLATELET
Abs Immature Granulocytes: 0.04 10*3/uL (ref 0.00–0.07)
Basophils Absolute: 0.1 10*3/uL (ref 0.0–0.1)
Basophils Relative: 1 %
Eosinophils Absolute: 0.4 10*3/uL (ref 0.0–0.5)
Eosinophils Relative: 4 %
HCT: 35.7 % — ABNORMAL LOW (ref 36.0–46.0)
Hemoglobin: 12 g/dL (ref 12.0–15.0)
Immature Granulocytes: 0 %
Lymphocytes Relative: 22 %
Lymphs Abs: 2 10*3/uL (ref 0.7–4.0)
MCH: 31.3 pg (ref 26.0–34.0)
MCHC: 33.6 g/dL (ref 30.0–36.0)
MCV: 93 fL (ref 80.0–100.0)
Monocytes Absolute: 0.7 10*3/uL (ref 0.1–1.0)
Monocytes Relative: 8 %
Neutro Abs: 5.9 10*3/uL (ref 1.7–7.7)
Neutrophils Relative %: 65 %
Platelets: 412 10*3/uL — ABNORMAL HIGH (ref 150–400)
RBC: 3.84 MIL/uL — ABNORMAL LOW (ref 3.87–5.11)
RDW: 13.1 % (ref 11.5–15.5)
WBC: 9.1 10*3/uL (ref 4.0–10.5)
nRBC: 0 % (ref 0.0–0.2)

## 2021-12-24 LAB — LACTIC ACID, PLASMA: Lactic Acid, Venous: 0.7 mmol/L (ref 0.5–1.9)

## 2021-12-24 LAB — TROPONIN I (HIGH SENSITIVITY)
Troponin I (High Sensitivity): 7 ng/L (ref <18)
Troponin I (High Sensitivity): 6 ng/L (ref <18)

## 2021-12-24 LAB — BRAIN NATRIURETIC PEPTIDE: B Natriuretic Peptide: 276.8 pg/mL — ABNORMAL HIGH (ref 0.0–100.0)

## 2021-12-24 LAB — D-DIMER, QUANTITATIVE: D-Dimer, Quant: 1.69 ug/mL-FEU — ABNORMAL HIGH (ref 0.00–0.50)

## 2021-12-24 MED ORDER — FUROSEMIDE 20 MG PO TABS: 20.0000 mg | ORAL_TABLET | Freq: Every day | ORAL | 0 refills | Status: DC

## 2021-12-24 MED ORDER — FUROSEMIDE 10 MG/ML IJ SOLN
20.0000 mg | Freq: Once | INTRAMUSCULAR | Status: AC
  Administered 2021-12-24: 20 mg via INTRAVENOUS
  Filled 2021-12-24: qty 2

## 2021-12-24 MED ORDER — IOHEXOL 350 MG/ML SOLN
100.0000 mL | Freq: Once | INTRAVENOUS | Status: AC | PRN
Start: 2021-12-24 — End: 2021-12-24
  Administered 2021-12-24: 60 mL via INTRAVENOUS

## 2021-12-24 NOTE — ED Notes (Signed)
Late entry -- Pt returned from [place]; no acute changes noted.

## 2021-12-24 NOTE — ED Notes (Signed)
Patient transported to CT via stretcher.

## 2021-12-24 NOTE — ED Notes (Addendum)
RT note: Ambulating Pulse Oximetry done with pt. on room air, tolerated well, results are as follows: Starting: Pulse-47 / 96% Sat. Ending:  Pulse-56 / 97% Sat. -Peak Flows have not been obtained prior to/on my shift, DO made aware and were not needed at this time, RN made aware.

## 2021-12-24 NOTE — ED Provider Notes (Signed)
[facility] EMERGENCY DEPT Provider Note  CSN: [id number] Arrival date & time: 12/24/21 0234  Chief Complaint(s) Chest Pain  HPI Julie Montgomery is a 86 y.o. female with a past medical history listed below including bronchiectasis, anxiety, recurrent C. difficile infections who is currently on after admission earlier in the month for recurrence.  During hospitalization patient was noted to have new onset atrial fibrillation and was placed on Eliquis.  She was also noted to have peripheral edema and given Lasix.  Lasix was not continued upon discharge.  For the past several days the patient has been having orthopnea at night.  Tonight the orthopnea was more severe and did not resolve with sitting up.  This prompted her evaluation tonight.  She is also endorsing chest tightness.  She is complaining of abdominal discomfort but improved from prior.  Reports that she has not had a bowel movement past couple days.  No nausea or vomiting.  No fevers or chills.  No coughing or congestion.  Patient has recurrence of her mild lower extremity edema in the last few days.  The history is provided by the patient and medical records.    Past Medical History Past Medical History:  Diagnosis Date   Adenomatous polyp of colon 2020   Allergy    Anxiety    Basal cell carcinoma (BCC)    Cataract    Depression    Helicobacter pylori gastritis    Osteopenia    Pneumonia    hx of 03/2008   Stroke [facility])    TIA long time ago   Patient Active Problem List   Diagnosis Date Noted   New onset atrial fibrillation ([place]) 12/19/2021   Clostridioides difficile infection 12/19/2021   Paroxysmal atrial fibrillation with RVR ([place]) 12/14/2021   C. difficile colitis 12/14/2021   Protein-calorie malnutrition, severe ([place]) 12/13/2021   Dehydration with hyponatremia 12/12/2021   Wound infection following procedure 11/29/2021   Breast lump 08/15/2021   History of hormone replacement therapy 08/15/2021    Hyperlipidemia 08/15/2021   Osteopenia 08/15/2021   Sensorineural hearing loss (SNHL) of both ears 08/15/2021   Underweight 03/31/2021   Statin myopathy 03/09/2021   Achilles tendon disorder, right 02/14/2021   Memory problem 01/19/2020   Ganglion cyst of left foot 01/11/2020   Mass of foot 12/14/2019   Gastroenteritis 03/23/2019   IBS (irritable bowel syndrome) 01/19/2019   Osteoporosis 01/13/2019   Coronary atherosclerosis 06/11/2018   Sternoclavicular joint pain, right 06/12/2017   Left ulnar fracture 09/06/2016   Post-nasal drip 05/02/2016   Carotid bruit 02/17/2016   Stress at home 10/18/2015   Swelling of foot joint 10/14/2014   Clostridium difficile colitis 10/04/2014   Diarrhea 10/04/2014   Left hand weakness 08/10/2014   Numbness of left hand 04/19/2014   Lipoma of arm 01/23/2012   Well adult exam 04/03/2011   Edema 02/13/2011   Shoulder pain, right 01/30/2011   Bronchiectasis ([place]) 01/23/2011   Vertigo 09/13/2010   Allergic rhinitis 09/13/2010   CONSTIPATION 03/22/2010   LUMBAGO 03/22/2010   URINARY FREQUENCY, CHRONIC 03/22/2010   Situational mixed anxiety and depressive disorder 02/25/2008   Cough 02/17/2008   Anxiety disorder 02/07/2007   INSOMNIA-SLEEP DISORDER-UNSPEC 02/07/2007   Weight loss 02/07/2007   Dyslipidemia 11/21/2006   OSTEOPENIA 11/21/2006   ALLERGY 11/21/2006   Home Medication(s) Prior to Admission medications   Medication Sig Start Date End Date Taking? Authorizing Provider  furosemide (LASIX) 20 MG tablet Take 1 tablet (20 mg total) by mouth daily  for 7 days. 12/24/21 12/31/21 Yes Cashlyn Huguley, Grayce Sessions, MD  apixaban (ELIQUIS) 2.5 MG TABS tablet Take 1 tablet (2.5 mg total) by mouth 2 (two) times daily. 12/18/21   Caren Griffins, MD  Ascorbic Acid (VITAMIN C PO) Take 1 tablet by mouth daily.    [provider]  Cholecalciferol (VITAMIN D3) 1000 UNITS CAPS Take 2,000 Units by mouth daily.    [provider]  fidaxomicin  (DIFICID) 200 MG TABS tablet Take 1 tablet by mouth 2 times daily. 12/15/21   Caren Griffins, MD  metoprolol tartrate (LOPRESSOR) 25 MG tablet Take 1 tablet (25 mg total) by mouth 2 (two) times daily. 12/18/21   Caren Griffins, MD                                                                                                                                    Allergies Lovastatin, Mometasone furo-formoterol fum, Prozac [fluoxetine hcl], and Sulfa antibiotics  Review of Systems Review of Systems As noted in HPI  Physical Exam Vital Signs  I have reviewed the triage vital signs BP (!) 144/63   Pulse (!) 113   Temp 98.2 F (36.8 C) (Oral)   Resp 14   Ht '5\' 3"'$  (1.6 m)   Wt 40.4 kg   SpO2 90%   BMI 15.77 kg/m   Physical Exam Vitals reviewed.  Constitutional:      General: She is not in acute distress.    Appearance: She is well-developed. She is not diaphoretic.  HENT:     Head: Normocephalic and atraumatic.     Nose: Nose normal.  Eyes:     General: No scleral icterus.       Right eye: No discharge.        Left eye: No discharge.     Conjunctiva/sclera: Conjunctivae normal.     Pupils: Pupils are equal, round, and reactive to light.  Cardiovascular:     Rate and Rhythm: Regular rhythm. Bradycardia present.     Heart sounds: No murmur heard.    No friction rub. No gallop.  Pulmonary:     Effort: Pulmonary effort is normal. Tachypnea present. No prolonged expiration or respiratory distress.     Breath sounds: Normal breath sounds. No stridor or decreased air movement. No decreased breath sounds, wheezing or rales.  Abdominal:     General: There is no distension.     Palpations: Abdomen is soft.     Tenderness: There is generalized abdominal tenderness (discomfort).  Musculoskeletal:        General: No tenderness.     Cervical back: Normal range of motion and neck supple.     Right lower leg: 1+ Pitting Edema present.     Left lower leg: 1+ Pitting Edema present.   Skin:    General: Skin is warm and dry.     Findings: No erythema or rash.  Neurological:  Mental Status: She is alert and oriented to person, place, and time.     ED Results and Treatments Labs (all labs ordered are listed, but only abnormal results are displayed) Labs Reviewed  COMPREHENSIVE METABOLIC PANEL - Abnormal; Notable for the following components:      Result Value   Sodium 133 (*)    Chloride 97 (*)    Total Protein 6.1 (*)    ALT 57 (*)    Alkaline Phosphatase 192 (*)    All other components within normal limits  BRAIN NATRIURETIC PEPTIDE - Abnormal; Notable for the following components:   B Natriuretic Peptide 276.8 (*)    All other components within normal limits  CBC WITH DIFFERENTIAL/PLATELET - Abnormal; Notable for the following components:   RBC 3.84 (*)    HCT 35.7 (*)    Platelets 412 (*)    All other components within normal limits  D-DIMER, QUANTITATIVE - Abnormal; Notable for the following components:   D-Dimer, Quant 1.69 (*)    All other components within normal limits  RESP PANEL BY RT-PCR (FLU A&B, COVID) [id number]  LACTIC ACID, PLASMA  TROPONIN I (HIGH SENSITIVITY)  TROPONIN I (HIGH SENSITIVITY)                                                                                                                         EKG  EKG Interpretation  Date/Time:  Sunday December 24 2021 03:21:59 EDT Ventricular Rate:  47 PR Interval:  150 QRS Duration: 93 QT Interval:  500 QTC Calculation: 443 R Axis:   67 Text Interpretation: Sinus bradycardia Probable left atrial enlargement Probable left ventricular hypertrophy Confirmed by Addison Lank [phone]) on 12/24/2021 3:40:36 AM       Radiology CT Angio Chest PE W and/or Wo Contrast  Result Date: 12/24/2021 CLINICAL DATA:  86 year old female with chest pain, shortness of breath and abdominal pain. Recently diagnosed with C difficile. EXAM: CT ANGIOGRAPHY CHEST CT ABDOMEN AND PELVIS WITH CONTRAST  TECHNIQUE: Multidetector CT imaging of the chest was performed using the standard protocol during bolus administration of intravenous contrast. Multiplanar CT image reconstructions and MIPs were obtained to evaluate the vascular anatomy. Multidetector CT imaging of the abdomen and pelvis was performed using the standard protocol during bolus administration of intravenous contrast. RADIATION DOSE REDUCTION: This exam was performed according to the departmental dose-optimization program which includes automated exposure control, adjustment of the mA and/or kV according to patient size and/or use of iterative reconstruction technique. CONTRAST:  48m OMNIPAQUE IOHEXOL 350 MG/ML SOLN COMPARISON:  CT Abdomen and Pelvis 12/12/2021.  Chest CT 12/15/2010. FINDINGS: CTA CHEST FINDINGS Cardiovascular: Excellent contrast bolus timing in the pulmonary arterial tree. No focal filling defect identified in the pulmonary arteries to suggest acute pulmonary embolism. No contrast in the aorta. Calcified aortic atherosclerosis. Cardiac size remains within normal limits. No pericardial effusion. Mediastinum/Nodes: No mediastinal mass or lymphadenopathy. Lungs/Pleura: Small bilateral layering pleural effusions. Major airways are patent. Chronic calcified granuloma in  the right lower lung is stable since 2012 on series 6, image 83. Upper lobe perihilar and peribronchial confluent ground-glass opacity is mildly asymmetric (series 6, image 36). More confluent right apical opacity is in part related to scarring which was present in 2012. Lung bases are largely clear. Mild compressive atelectasis. No pulmonary consolidation at this time. Musculoskeletal: No acute or suspicious osseous lesion identified in the chest. Review of the MIP images confirms the above findings. CT ABDOMEN and PELVIS FINDINGS Hepatobiliary: Absent gallbladder.  Stable and negative liver. Pancreas: Negative. Spleen: Stable. Coarsely calcified splenic hilar aneurysm is  unchanged. No perisplenic fluid. Adrenals/Urinary Tract: Stable and negative. Incidental pelvic phleboliths. Stomach/Bowel: Regression of rectosigmoid colon inflammation since 12/12/2021. Redundant large bowel with retained stool throughout. But no discrete large bowel inflammation is identified now. The cecum is located in the pelvis. Distal small bowel is nondilated. Appendix not clearly identified but no evidence of acute appendicitis. No dilated small bowel in the abdomen. Stomach is decompressed. No free air or free fluid identified. Vascular/Lymphatic: Calcified aortic atherosclerosis. Normal caliber abdominal aorta. Major arterial structures in the abdomen and pelvis remain patent. Mild for age iliofemoral atherosclerosis. Portal venous system is patent. No lymphadenopathy identified. Reproductive: Surgically absent uterus. Diminutive or absent ovaries. Other: No pelvic free fluid. Musculoskeletal: Stable.  No acute osseous abnormality identified. Review of the MIP images confirms the above findings. IMPRESSION: 1. Negative for acute pulmonary embolism. 2. Small layering pleural effusions with nonspecific asymmetric bilateral upper lobe and perihilar opacity. Differential considerations include pulmonary edema, viral/atypical respiratory pneumonia. 3. Regressed rectosigmoid colon inflammation since 12/12/2021, and no discrete large bowel inflammation now. Retained stool throughout redundant colon. No evidence of bowel obstruction. 4. No other acute or inflammatory process identified in the chest, abdomen, or pelvis. 5. Aortic Atherosclerosis (ICD10-I70.0). Electronically Signed   By: Genevie Ann M.D.   On: 12/24/2021 05:59   CT ABDOMEN PELVIS W CONTRAST  Result Date: 12/24/2021 CLINICAL DATA:  86 year old female with chest pain, shortness of breath and abdominal pain. Recently diagnosed with C difficile. EXAM: CT ANGIOGRAPHY CHEST CT ABDOMEN AND PELVIS WITH CONTRAST TECHNIQUE: Multidetector CT imaging of the  chest was performed using the standard protocol during bolus administration of intravenous contrast. Multiplanar CT image reconstructions and MIPs were obtained to evaluate the vascular anatomy. Multidetector CT imaging of the abdomen and pelvis was performed using the standard protocol during bolus administration of intravenous contrast. RADIATION DOSE REDUCTION: This exam was performed according to the departmental dose-optimization program which includes automated exposure control, adjustment of the mA and/or kV according to patient size and/or use of iterative reconstruction technique. CONTRAST:  72m OMNIPAQUE IOHEXOL 350 MG/ML SOLN COMPARISON:  CT Abdomen and Pelvis 12/12/2021.  Chest CT 12/15/2010. FINDINGS: CTA CHEST FINDINGS Cardiovascular: Excellent contrast bolus timing in the pulmonary arterial tree. No focal filling defect identified in the pulmonary arteries to suggest acute pulmonary embolism. No contrast in the aorta. Calcified aortic atherosclerosis. Cardiac size remains within normal limits. No pericardial effusion. Mediastinum/Nodes: No mediastinal mass or lymphadenopathy. Lungs/Pleura: Small bilateral layering pleural effusions. Major airways are patent. Chronic calcified granuloma in the right lower lung is stable since 2012 on series 6, image 83. Upper lobe perihilar and peribronchial confluent ground-glass opacity is mildly asymmetric (series 6, image 36). More confluent right apical opacity is in part related to scarring which was present in 2012. Lung bases are largely clear. Mild compressive atelectasis. No pulmonary consolidation at this time. Musculoskeletal: No acute or suspicious osseous lesion identified  in the chest. Review of the MIP images confirms the above findings. CT ABDOMEN and PELVIS FINDINGS Hepatobiliary: Absent gallbladder.  Stable and negative liver. Pancreas: Negative. Spleen: Stable. Coarsely calcified splenic hilar aneurysm is unchanged. No perisplenic fluid.  Adrenals/Urinary Tract: Stable and negative. Incidental pelvic phleboliths. Stomach/Bowel: Regression of rectosigmoid colon inflammation since 12/12/2021. Redundant large bowel with retained stool throughout. But no discrete large bowel inflammation is identified now. The cecum is located in the pelvis. Distal small bowel is nondilated. Appendix not clearly identified but no evidence of acute appendicitis. No dilated small bowel in the abdomen. Stomach is decompressed. No free air or free fluid identified. Vascular/Lymphatic: Calcified aortic atherosclerosis. Normal caliber abdominal aorta. Major arterial structures in the abdomen and pelvis remain patent. Mild for age iliofemoral atherosclerosis. Portal venous system is patent. No lymphadenopathy identified. Reproductive: Surgically absent uterus. Diminutive or absent ovaries. Other: No pelvic free fluid. Musculoskeletal: Stable.  No acute osseous abnormality identified. Review of the MIP images confirms the above findings. IMPRESSION: 1. Negative for acute pulmonary embolism. 2. Small layering pleural effusions with nonspecific asymmetric bilateral upper lobe and perihilar opacity. Differential considerations include pulmonary edema, viral/atypical respiratory pneumonia. 3. Regressed rectosigmoid colon inflammation since 12/12/2021, and no discrete large bowel inflammation now. Retained stool throughout redundant colon. No evidence of bowel obstruction. 4. No other acute or inflammatory process identified in the chest, abdomen, or pelvis. 5. Aortic Atherosclerosis (ICD10-I70.0). Electronically Signed   By: Genevie Ann M.D.   On: 12/24/2021 05:59   DG Chest Port 1 View  Result Date: 12/24/2021 CLINICAL DATA:  Shortness of breath, chest pain EXAM: PORTABLE CHEST 1 VIEW COMPARISON:  12/16/2021 FINDINGS: There is hyperinflation of the lungs compatible with COPD. Small bilateral pleural effusions. Chronic interstitial prominence throughout the lungs. Increasing patchy  right upper lobe opacity. Heart is normal size. No acute bony abnormality. IMPRESSION: COPD/chronic changes. Increasing patchy opacity in the right upper lobe. Cannot exclude pneumonia. Small bilateral effusions. Electronically Signed   By: Rolm Baptise M.D.   On: 12/24/2021 03:31    Medications Ordered in ED Medications  iohexol (OMNIPAQUE) 350 MG/ML injection 100 mL (60 mLs Intravenous Contrast Given 12/24/21 0436)  furosemide (LASIX) injection 20 mg (20 mg Intravenous Given 12/24/21 0622)                                                                                                                                     Procedures Procedures  (including critical care time)  Medical Decision Making / ED Course   Medical Decision Making Amount and/or Complexity of Data Reviewed External Data Reviewed: notes.    Details: From recent admission detailed above Labs: ordered. Decision-making details documented in ED Course. Radiology: ordered and independent interpretation performed. Decision-making details documented in ED Course. ECG/medicine tests: ordered and independent interpretation performed. Decision-making details documented in ED Course.  Risk Prescription drug management.   Orthopnea with chest tightness and  shortness of breath. Patient has slight peripheral edema on exam. Recent echocardiogram notable for EF of 60 to 65% noted in recent admission.  Given her history of bronchiectasis patient may have mucous plug, will assess for evidence of pneumonia.  We will also check for pneumothorax, PE.  With the edema will assess for any pulmonary edema.  Less concerning for ACS but will obtain cardiac work-up to ensure.  EKG with sinus bradycardia.  No acute ischemic changes or evidence of pericarditis. Serial troponins negative x2.  Chest x-ray with evidence of right upper lobe opacity.  No pneumothorax.  No pleural effusion. Dimer positive. CTA ordered and negative for PE.  It did  reconfirm the right upper lobe opacity concerning for edema versus atypical pneumonia.  Given her clinical picture and elevated BNP at 270 I am favoring loculated pulmonary edema.  Patient does not have a fever, leukocytosis, and has not had any cough or congestion that would favor infectious etiology.  Will defer antibiotics especially given her history of C. Difficile.  Rest of the labs were reassuring without anemia. No significant electrolyte derangements or renal sufficiency.  LFTs are close to her baseline. COVID/influenza was negative. Lactic acid normal.  Given her abdominal discomfort and history of C. difficile, CT of the abdomen and pelvis was obtained and notable for diffuse stool throughout the colon without evidence of infection or inflammation.  This was confirmed by radiology.  No other acute findings noted.  Patient was given a low dose of IV Lasix to which she responded well.  Felt significantly better after diuresing.  She was ambulated without increased work of breathing or desaturation.  Felt that she is stable for discharge home with a prescription for low-dose Lasix.  Close follow-up with PCP/cardiology.       Final Clinical Impression(s) / ED Diagnoses Final diagnoses:  Chest tightness  Peripheral edema  History of Clostridioides difficile infection  The patient appears reasonably screened and/or stabilized for discharge and I doubt any other medical condition or other [facility] requiring further screening, evaluation, or treatment in the ED at this time. I have discussed the findings, Dx and Tx plan with the patient/family who expressed understanding and agree(s) with the plan. Discharge instructions discussed at length. The patient/family was given strict return precautions who verbalized understanding of the instructions. No further questions at time of discharge.  Disposition: Discharge  Condition: Good  ED Discharge Orders          Ordered    furosemide (LASIX) 20  MG tablet  Daily        12/24/21 0017              Follow Up: Cassandria Anger, MD [employer] [place] [address] [phone]  Call  to schedule an appointment for close follow up  Cardiology  Call  to schedule an appointment for close follow up            This chart was dictated using voice recognition software.  Despite best efforts to proofread,  errors can occur which can change the documentation meaning.    Fatima Blank, MD 12/24/21 [phone]

## 2021-12-24 NOTE — ED Notes (Signed)
1 person assist to bedside commode. Patient ambulated without difficulty, denies any pain or shortness of breath. Spouse at bedside. Both patients pleasant and feel comfortable with being discharged home. No change in vitals with ambulation to bedside.

## 2021-12-24 NOTE — ED Triage Notes (Signed)
Chest pain and sob started earlier tonight but worsened. Unable to speak in full sentences. Appears in distress. Hr variable,  Recent hospitalization for cdiff and new afib

## 2021-12-24 NOTE — Discharge Instructions (Addendum)
I have prescribed you 20 mg of Lasix once a day.  You can start taking this medicine on Monday morning.  Please call your cardiologist on Monday as well to ensure you have an upcoming appointment within the next 1 to 2 weeks.  If you develop fever, productive cough, worsening shortness of breath, lightheadedness/faint upon standing or at rest please return to the emergency department for reevaluation.

## 2021-12-26 ENCOUNTER — Ambulatory Visit (INDEPENDENT_AMBULATORY_CARE_PROVIDER_SITE_OTHER): Payer: Medicare Other | Admitting: Internal Medicine

## 2021-12-26 ENCOUNTER — Encounter: Payer: Self-pay | Admitting: Internal Medicine

## 2021-12-26 DIAGNOSIS — I480 Paroxysmal atrial fibrillation: Secondary | ICD-10-CM

## 2021-12-26 DIAGNOSIS — A0472 Enterocolitis due to Clostridium difficile, not specified as recurrent: Secondary | ICD-10-CM

## 2021-12-26 DIAGNOSIS — R634 Abnormal weight loss: Secondary | ICD-10-CM

## 2021-12-26 LAB — CBC WITH DIFFERENTIAL/PLATELET
Basophils Absolute: 0.1 10*3/uL (ref 0.0–0.1)
Basophils Relative: 1 % (ref 0.0–3.0)
Eosinophils Absolute: 0.1 10*3/uL (ref 0.0–0.7)
Eosinophils Relative: 1.5 % (ref 0.0–5.0)
HCT: 34.5 % — ABNORMAL LOW (ref 36.0–46.0)
Hemoglobin: 11.7 g/dL — ABNORMAL LOW (ref 12.0–15.0)
Lymphocytes Relative: 17.3 % (ref 12.0–46.0)
Lymphs Abs: 1.3 10*3/uL (ref 0.7–4.0)
MCHC: 34 g/dL (ref 30.0–36.0)
MCV: 93.8 fl (ref 78.0–100.0)
Monocytes Absolute: 0.5 10*3/uL (ref 0.1–1.0)
Monocytes Relative: 6.4 % (ref 3.0–12.0)
Neutro Abs: 5.4 10*3/uL (ref 1.4–7.7)
Neutrophils Relative %: 73.8 % (ref 43.0–77.0)
Platelets: 432 10*3/uL — ABNORMAL HIGH (ref 150.0–400.0)
RBC: 3.67 Mil/uL — ABNORMAL LOW (ref 3.87–5.11)
RDW: 13.2 % (ref 11.5–15.5)
WBC: 7.4 10*3/uL (ref 4.0–10.5)

## 2021-12-26 MED ORDER — ALIGN 4 MG PO CAPS: 1.0000 | ORAL_CAPSULE | Freq: Every day | ORAL | 1 refills | Status: AC

## 2021-12-26 NOTE — Assessment & Plan Note (Signed)
New A fib - f/u w/Dr Terri Skains, cont anticoagulation w/Eliquis

## 2021-12-26 NOTE — Assessment & Plan Note (Signed)
Trying to eat better

## 2021-12-26 NOTE — Progress Notes (Signed)
Subjective:  Patient ID: Julie Montgomery, female    DOB: [birth year]  Age: 86 y.o. MRN: [id number]  CC: Follow-up (Hosp follow-up)   HPI Julie Montgomery presents for C diff colitis, new A fib - Dr Terri Skains, anticoagulation.  Per hx: "Principal problem C. difficile colitis, diarrhea-patient was admitted to the hospital with severe watery diarrhea going on for the past several days at home after she finished outpatient antibiotic.  This is her third episode in the last 2 years, she was placed on Dificid with significant improvement, her diarrhea is now resolved, she has no abdominal pain, tolerating a regular diet.  She will be discharged home in stable condition and will finish Dificid as an outpatient.   Active problems Paroxysmal A-fib with RVR-new onset 8/17, heart rates were in the 180s-190s.  Cardiology consulted.  Due to elevated CHA2DS2-VASc score of 5, recommending anticoagulation, she was on heparin and now transition to Eliquis.  She was briefly on amiodarone but discontinued once she converted to sinus, as she was bradycardic.  Currently on metoprolol and Eliquis.  2D echo showed normal EF.  Dyspnea-likely due to mild fluid overload, received Lasix and now resolved.  Elevated LFTs-possibly transient poor perfusion in the setting of A-fib with RVR with rates into the 180s.  Right upper quadrant ultrasound fairly unremarkable.  LFTs improving Hyponatremia-mild, due to dehydration.  Sodium normalized Hypokalemia-potassium normalized  Scalp lesion-status post removal by dermatology 3 weeks prior, completed a course of Keflex for concern for surrounding cellulitis.  Stable now   Sepsis ruled out"  Outpatient Medications Prior to Visit  Medication Sig Dispense Refill   apixaban (ELIQUIS) 2.5 MG TABS tablet Take 1 tablet (2.5 mg total) by mouth 2 (two) times daily. 60 tablet 0   Ascorbic Acid (VITAMIN C PO) Take 1 tablet by mouth daily.     Cholecalciferol (VITAMIN D3) 1000 UNITS CAPS  Take 2,000 Units by mouth daily.     fidaxomicin (DIFICID) 200 MG TABS tablet Take 1 tablet by mouth 2 times daily. 20 tablet 0   furosemide (LASIX) 20 MG tablet Take 1 tablet (20 mg total) by mouth daily for 7 days. 7 tablet 0   metoprolol tartrate (LOPRESSOR) 25 MG tablet Take 1 tablet (25 mg total) by mouth 2 (two) times daily. 60 tablet 0   No facility-administered medications prior to visit.    ROS: Review of Systems  Constitutional:  Positive for fatigue. Negative for activity change, appetite change, chills and unexpected weight change.  HENT:  Negative for congestion, mouth sores and sinus pressure.   Eyes:  Negative for visual disturbance.  Respiratory:  Negative for cough and chest tightness.   Gastrointestinal:  Negative for abdominal pain, blood in stool, diarrhea, nausea and vomiting.  Genitourinary:  Negative for difficulty urinating, frequency and vaginal pain.  Musculoskeletal:  Negative for back pain and gait problem.  Skin:  Negative for color change, pallor and rash.  Neurological:  Positive for weakness. Negative for dizziness, tremors, numbness and headaches.  Psychiatric/Behavioral:  Negative for confusion and sleep disturbance.     Objective:  BP (!) 120/52 (BP Location: Left Arm)   Pulse (!) 54   Temp 98 F (36.7 C) (Oral)   Ht 5' 2.5" (1.588 m)   Wt 94 lb (42.6 kg)   SpO2 97%   BMI 16.92 kg/m   BP Readings from Last 3 Encounters:  12/26/21 (!) 120/52  12/24/21 (!) 141/112  12/19/21 (!) 144/70    Wt Readings  from Last 3 Encounters:  12/26/21 94 lb (42.6 kg)  12/24/21 89 lb (40.4 kg)  12/19/21 90 lb (40.8 kg)    Physical Exam Constitutional:      General: She is not in acute distress.    Appearance: She is well-developed.  HENT:     Head: Normocephalic.     Right Ear: External ear normal.     Left Ear: External ear normal.     Nose: Nose normal.  Eyes:     General:        Right eye: No discharge.        Left eye: No discharge.      Conjunctiva/sclera: Conjunctivae normal.     Pupils: Pupils are equal, round, and reactive to light.  Neck:     Thyroid: No thyromegaly.     Vascular: No JVD.     Trachea: No tracheal deviation.  Cardiovascular:     Rate and Rhythm: Normal rate and regular rhythm.     Heart sounds: Normal heart sounds.  Pulmonary:     Effort: No respiratory distress.     Breath sounds: No stridor. No wheezing.  Abdominal:     General: Bowel sounds are normal. There is no distension.     Palpations: Abdomen is soft. There is no mass.     Tenderness: There is no abdominal tenderness. There is no guarding or rebound.  Musculoskeletal:        General: No tenderness.     Cervical back: Normal range of motion and neck supple. No rigidity.  Lymphadenopathy:     Cervical: No cervical adenopathy.  Skin:    Findings: No erythema or rash.  Neurological:     Mental Status: She is oriented to person, place, and time.     Cranial Nerves: No cranial nerve deficit.     Motor: No abnormal muscle tone.     Coordination: Coordination normal.     Deep Tendon Reflexes: Reflexes normal.  Psychiatric:        Behavior: Behavior normal.        Thought Content: Thought content normal.        Judgment: Judgment normal.     Lab Results  Component Value Date   WBC 9.1 12/24/2021   HGB 12.0 12/24/2021   HCT 35.7 (L) 12/24/2021   PLT 412 (H) 12/24/2021   GLUCOSE 99 12/24/2021   CHOL 156 12/15/2021   TRIG 84 12/15/2021   HDL 53 12/15/2021   LDLDIRECT 82 12/15/2021   LDLCALC 86 12/15/2021   ALT 57 (H) 12/24/2021   AST 32 12/24/2021   NA 133 (L) 12/24/2021   K 4.9 12/24/2021   CL 97 (L) 12/24/2021   CREATININE 0.53 12/24/2021   BUN 22 12/24/2021   CO2 27 12/24/2021   TSH 6.357 (H) 12/14/2021    CT Angio Chest PE W and/or Wo Contrast  Result Date: 12/24/2021 CLINICAL DATA:  86 year old female with chest pain, shortness of breath and abdominal pain. Recently diagnosed with C difficile. EXAM: CT ANGIOGRAPHY  CHEST CT ABDOMEN AND PELVIS WITH CONTRAST TECHNIQUE: Multidetector CT imaging of the chest was performed using the standard protocol during bolus administration of intravenous contrast. Multiplanar CT image reconstructions and MIPs were obtained to evaluate the vascular anatomy. Multidetector CT imaging of the abdomen and pelvis was performed using the standard protocol during bolus administration of intravenous contrast. RADIATION DOSE REDUCTION: This exam was performed according to the departmental dose-optimization program which includes automated exposure control, adjustment of  the mA and/or kV according to patient size and/or use of iterative reconstruction technique. CONTRAST:  34m OMNIPAQUE IOHEXOL 350 MG/ML SOLN COMPARISON:  CT Abdomen and Pelvis 12/12/2021.  Chest CT 12/15/2010. FINDINGS: CTA CHEST FINDINGS Cardiovascular: Excellent contrast bolus timing in the pulmonary arterial tree. No focal filling defect identified in the pulmonary arteries to suggest acute pulmonary embolism. No contrast in the aorta. Calcified aortic atherosclerosis. Cardiac size remains within normal limits. No pericardial effusion. Mediastinum/Nodes: No mediastinal mass or lymphadenopathy. Lungs/Pleura: Small bilateral layering pleural effusions. Major airways are patent. Chronic calcified granuloma in the right lower lung is stable since 2012 on series 6, image 83. Upper lobe perihilar and peribronchial confluent ground-glass opacity is mildly asymmetric (series 6, image 36). More confluent right apical opacity is in part related to scarring which was present in 2012. Lung bases are largely clear. Mild compressive atelectasis. No pulmonary consolidation at this time. Musculoskeletal: No acute or suspicious osseous lesion identified in the chest. Review of the MIP images confirms the above findings. CT ABDOMEN and PELVIS FINDINGS Hepatobiliary: Absent gallbladder.  Stable and negative liver. Pancreas: Negative. Spleen: Stable.  Coarsely calcified splenic hilar aneurysm is unchanged. No perisplenic fluid. Adrenals/Urinary Tract: Stable and negative. Incidental pelvic phleboliths. Stomach/Bowel: Regression of rectosigmoid colon inflammation since 12/12/2021. Redundant large bowel with retained stool throughout. But no discrete large bowel inflammation is identified now. The cecum is located in the pelvis. Distal small bowel is nondilated. Appendix not clearly identified but no evidence of acute appendicitis. No dilated small bowel in the abdomen. Stomach is decompressed. No free air or free fluid identified. Vascular/Lymphatic: Calcified aortic atherosclerosis. Normal caliber abdominal aorta. Major arterial structures in the abdomen and pelvis remain patent. Mild for age iliofemoral atherosclerosis. Portal venous system is patent. No lymphadenopathy identified. Reproductive: Surgically absent uterus. Diminutive or absent ovaries. Other: No pelvic free fluid. Musculoskeletal: Stable.  No acute osseous abnormality identified. Review of the MIP images confirms the above findings. IMPRESSION: 1. Negative for acute pulmonary embolism. 2. Small layering pleural effusions with nonspecific asymmetric bilateral upper lobe and perihilar opacity. Differential considerations include pulmonary edema, viral/atypical respiratory pneumonia. 3. Regressed rectosigmoid colon inflammation since 12/12/2021, and no discrete large bowel inflammation now. Retained stool throughout redundant colon. No evidence of bowel obstruction. 4. No other acute or inflammatory process identified in the chest, abdomen, or pelvis. 5. Aortic Atherosclerosis (ICD10-I70.0). Electronically Signed   By: HGenevie AnnM.D.   On: 12/24/2021 05:59   CT ABDOMEN PELVIS W CONTRAST  Result Date: 12/24/2021 CLINICAL DATA:  86year old female with chest pain, shortness of breath and abdominal pain. Recently diagnosed with C difficile. EXAM: CT ANGIOGRAPHY CHEST CT ABDOMEN AND PELVIS WITH  CONTRAST TECHNIQUE: Multidetector CT imaging of the chest was performed using the standard protocol during bolus administration of intravenous contrast. Multiplanar CT image reconstructions and MIPs were obtained to evaluate the vascular anatomy. Multidetector CT imaging of the abdomen and pelvis was performed using the standard protocol during bolus administration of intravenous contrast. RADIATION DOSE REDUCTION: This exam was performed according to the departmental dose-optimization program which includes automated exposure control, adjustment of the mA and/or kV according to patient size and/or use of iterative reconstruction technique. CONTRAST:  697mOMNIPAQUE IOHEXOL 350 MG/ML SOLN COMPARISON:  CT Abdomen and Pelvis 12/12/2021.  Chest CT 12/15/2010. FINDINGS: CTA CHEST FINDINGS Cardiovascular: Excellent contrast bolus timing in the pulmonary arterial tree. No focal filling defect identified in the pulmonary arteries to suggest acute pulmonary embolism. No contrast in the aorta.  Calcified aortic atherosclerosis. Cardiac size remains within normal limits. No pericardial effusion. Mediastinum/Nodes: No mediastinal mass or lymphadenopathy. Lungs/Pleura: Small bilateral layering pleural effusions. Major airways are patent. Chronic calcified granuloma in the right lower lung is stable since 2012 on series 6, image 83. Upper lobe perihilar and peribronchial confluent ground-glass opacity is mildly asymmetric (series 6, image 36). More confluent right apical opacity is in part related to scarring which was present in 2012. Lung bases are largely clear. Mild compressive atelectasis. No pulmonary consolidation at this time. Musculoskeletal: No acute or suspicious osseous lesion identified in the chest. Review of the MIP images confirms the above findings. CT ABDOMEN and PELVIS FINDINGS Hepatobiliary: Absent gallbladder.  Stable and negative liver. Pancreas: Negative. Spleen: Stable. Coarsely calcified splenic hilar  aneurysm is unchanged. No perisplenic fluid. Adrenals/Urinary Tract: Stable and negative. Incidental pelvic phleboliths. Stomach/Bowel: Regression of rectosigmoid colon inflammation since 12/12/2021. Redundant large bowel with retained stool throughout. But no discrete large bowel inflammation is identified now. The cecum is located in the pelvis. Distal small bowel is nondilated. Appendix not clearly identified but no evidence of acute appendicitis. No dilated small bowel in the abdomen. Stomach is decompressed. No free air or free fluid identified. Vascular/Lymphatic: Calcified aortic atherosclerosis. Normal caliber abdominal aorta. Major arterial structures in the abdomen and pelvis remain patent. Mild for age iliofemoral atherosclerosis. Portal venous system is patent. No lymphadenopathy identified. Reproductive: Surgically absent uterus. Diminutive or absent ovaries. Other: No pelvic free fluid. Musculoskeletal: Stable.  No acute osseous abnormality identified. Review of the MIP images confirms the above findings. IMPRESSION: 1. Negative for acute pulmonary embolism. 2. Small layering pleural effusions with nonspecific asymmetric bilateral upper lobe and perihilar opacity. Differential considerations include pulmonary edema, viral/atypical respiratory pneumonia. 3. Regressed rectosigmoid colon inflammation since 12/12/2021, and no discrete large bowel inflammation now. Retained stool throughout redundant colon. No evidence of bowel obstruction. 4. No other acute or inflammatory process identified in the chest, abdomen, or pelvis. 5. Aortic Atherosclerosis (ICD10-I70.0). Electronically Signed   By: Genevie Ann M.D.   On: 12/24/2021 05:59   DG Chest Port 1 View  Result Date: 12/24/2021 CLINICAL DATA:  Shortness of breath, chest pain EXAM: PORTABLE CHEST 1 VIEW COMPARISON:  12/16/2021 FINDINGS: There is hyperinflation of the lungs compatible with COPD. Small bilateral pleural effusions. Chronic interstitial  prominence throughout the lungs. Increasing patchy right upper lobe opacity. Heart is normal size. No acute bony abnormality. IMPRESSION: COPD/chronic changes. Increasing patchy opacity in the right upper lobe. Cannot exclude pneumonia. Small bilateral effusions. Electronically Signed   By: Rolm Baptise M.D.   On: 12/24/2021 03:31    Assessment & Plan:   Problem List Items Addressed This Visit     Clostridium difficile colitis    Recurrent C diff -  pt  was prescribed Keflex in the outpatient setting due to concerns for possible early cellulitis.  Patient took her Keflex as instructed, completing her course of therapy on 8/13.  -  Treated w/ Deficid      Paroxysmal atrial fibrillation with RVR ([place])    New A fib - f/u w/Dr Terri Skains, cont anticoagulation w/Eliquis      Relevant Orders   CBC with Differential/Platelet   Comprehensive metabolic panel   Weight loss    Trying to eat better      Relevant Orders   CBC with Differential/Platelet   Comprehensive metabolic panel      No orders of the defined types were placed in this encounter.  Follow-up: Return in about 6 weeks (around 02/06/2022) for a follow-up visit.  Walker Kehr, MD

## 2021-12-26 NOTE — Assessment & Plan Note (Addendum)
Recurrent C diff -  pt  was prescribed Keflex in the outpatient setting due to concerns for possible early cellulitis. Patient took her Keflex as instructed, completing her course of therapy on 8/13. -  Treated w/ Deficid

## 2021-12-27 DIAGNOSIS — E43 Unspecified severe protein-calorie malnutrition: Secondary | ICD-10-CM | POA: Diagnosis not present

## 2021-12-27 DIAGNOSIS — I480 Paroxysmal atrial fibrillation: Secondary | ICD-10-CM | POA: Diagnosis not present

## 2021-12-27 DIAGNOSIS — I2510 Atherosclerotic heart disease of native coronary artery without angina pectoris: Secondary | ICD-10-CM | POA: Diagnosis not present

## 2021-12-27 DIAGNOSIS — I700 Atherosclerosis of aorta: Secondary | ICD-10-CM | POA: Diagnosis not present

## 2021-12-27 DIAGNOSIS — A0471 Enterocolitis due to Clostridium difficile, recurrent: Secondary | ICD-10-CM | POA: Diagnosis not present

## 2021-12-27 DIAGNOSIS — J479 Bronchiectasis, uncomplicated: Secondary | ICD-10-CM | POA: Diagnosis not present

## 2021-12-27 LAB — COMPREHENSIVE METABOLIC PANEL
ALT: 49 U/L — ABNORMAL HIGH (ref 0–35)
AST: 29 U/L (ref 0–37)
Albumin: 3.5 g/dL (ref 3.5–5.2)
Alkaline Phosphatase: 154 U/L — ABNORMAL HIGH (ref 39–117)
BUN: 21 mg/dL (ref 6–23)
CO2: 28 mEq/L (ref 19–32)
Calcium: 9.2 mg/dL (ref 8.4–10.5)
Chloride: 98 mEq/L (ref 96–112)
Creatinine, Ser: 0.67 mg/dL (ref 0.40–1.20)
GFR: 80.61 mL/min (ref >60.00)
Glucose, Bld: 95 mg/dL (ref 70–99)
Potassium: 4.6 mEq/L (ref 3.5–5.1)
Sodium: 134 mEq/L — ABNORMAL LOW (ref 135–145)
Total Bilirubin: 0.4 mg/dL (ref 0.2–1.2)
Total Protein: 6.4 g/dL (ref 6.0–8.3)

## 2022-01-02 ENCOUNTER — Encounter: Payer: Self-pay | Admitting: Cardiology

## 2022-01-02 ENCOUNTER — Telehealth: Payer: Self-pay | Admitting: Cardiology

## 2022-01-02 DIAGNOSIS — Z85828 Personal history of other malignant neoplasm of skin: Secondary | ICD-10-CM | POA: Diagnosis not present

## 2022-01-02 DIAGNOSIS — L905 Scar conditions and fibrosis of skin: Secondary | ICD-10-CM | POA: Diagnosis not present

## 2022-01-02 DIAGNOSIS — L570 Actinic keratosis: Secondary | ICD-10-CM | POA: Diagnosis not present

## 2022-01-02 DIAGNOSIS — L0889 Other specified local infections of the skin and subcutaneous tissue: Secondary | ICD-10-CM | POA: Diagnosis not present

## 2022-01-02 NOTE — Telephone Encounter (Signed)
Afrin OTC PRN. I sent her mychart message also

## 2022-01-02 NOTE — Telephone Encounter (Signed)
[name] husband says she was prescribed eliquis in the hospital by [facility] and was told to call if patient experienced any nosebleeds. Says her nose was bleeding off and on yesterday so they want to know what to do.

## 2022-01-03 DIAGNOSIS — I700 Atherosclerosis of aorta: Secondary | ICD-10-CM | POA: Diagnosis not present

## 2022-01-03 DIAGNOSIS — I2510 Atherosclerotic heart disease of native coronary artery without angina pectoris: Secondary | ICD-10-CM | POA: Diagnosis not present

## 2022-01-03 DIAGNOSIS — E43 Unspecified severe protein-calorie malnutrition: Secondary | ICD-10-CM | POA: Diagnosis not present

## 2022-01-03 DIAGNOSIS — J479 Bronchiectasis, uncomplicated: Secondary | ICD-10-CM | POA: Diagnosis not present

## 2022-01-03 DIAGNOSIS — A0471 Enterocolitis due to Clostridium difficile, recurrent: Secondary | ICD-10-CM | POA: Diagnosis not present

## 2022-01-03 DIAGNOSIS — I480 Paroxysmal atrial fibrillation: Secondary | ICD-10-CM | POA: Diagnosis not present

## 2022-01-04 ENCOUNTER — Telehealth: Payer: Self-pay

## 2022-01-04 NOTE — Telephone Encounter (Signed)
Please contact patient

## 2022-01-04 NOTE — Telephone Encounter (Signed)
Can you find a time to put patient in?

## 2022-01-04 NOTE — Telephone Encounter (Signed)
Bring her in today or tomorrow

## 2022-01-04 NOTE — Telephone Encounter (Signed)
Bring her in tomorrow at 1215. Open my office until [place]

## 2022-01-04 NOTE — Telephone Encounter (Signed)
Patient called and stated that she was recently prescribed Eliquis and Metoprolol and she has since been chest tightness and SOB. She also stated that she has been having to sleep sitting up because she is having trouble breathing. Patient stated that husband had called on 01/02/2022 and that she had not received a call back. I explained to her that she was sent a MyChart message. She stated that she does not know how to use Mychart. Patient stated that there is more to what her husband let us know. She feels she is getting worse. Please advise.   Do Not Send Message in [place].

## 2022-01-05 ENCOUNTER — Ambulatory Visit: Payer: Medicare Other | Admitting: Cardiology

## 2022-01-05 ENCOUNTER — Encounter: Payer: Self-pay | Admitting: Cardiology

## 2022-01-05 VITALS — BP 143/63 | HR 48 | Temp 98.0°F | Resp 16 | Ht 62.0 in | Wt 91.2 lb

## 2022-01-05 DIAGNOSIS — R072 Precordial pain: Secondary | ICD-10-CM

## 2022-01-05 DIAGNOSIS — I700 Atherosclerosis of aorta: Secondary | ICD-10-CM | POA: Diagnosis not present

## 2022-01-05 DIAGNOSIS — R0602 Shortness of breath: Secondary | ICD-10-CM | POA: Diagnosis not present

## 2022-01-05 DIAGNOSIS — I480 Paroxysmal atrial fibrillation: Secondary | ICD-10-CM

## 2022-01-05 DIAGNOSIS — R931 Abnormal findings on diagnostic imaging of heart and coronary circulation: Secondary | ICD-10-CM | POA: Diagnosis not present

## 2022-01-05 DIAGNOSIS — Z8673 Personal history of transient ischemic attack (TIA), and cerebral infarction without residual deficits: Secondary | ICD-10-CM | POA: Diagnosis not present

## 2022-01-05 MED ORDER — FUROSEMIDE 20 MG PO TABS: 20.0000 mg | ORAL_TABLET | Freq: Every day | ORAL | 0 refills | Status: DC

## 2022-01-05 MED ORDER — METOPROLOL SUCCINATE ER 25 MG PO TB24: 25.0000 mg | ORAL_TABLET | Freq: Every morning | ORAL | 0 refills | Status: DC

## 2022-01-05 NOTE — Telephone Encounter (Signed)
Patient was seen today.

## 2022-01-05 NOTE — Progress Notes (Signed)
ID:  Julie Montgomery, DOB January 31, 1936, MRN [id number]  PCP:  Cassandria Anger, MD  Cardiologist:  Rex Kras, DO, [facility] (established care 12/14/2021)  Chief Complaint  Patient presents with   Chest Pain   Shortness of Breath   New Patient (Initial Visit)    HPI  Julie Montgomery is a 86 y.o. Caucasian female whose past medical history and cardiovascular risk factors include: Mild coronary artery calcification, history of C. difficile infection (2016 and 2018), Aortic Atherosclerosis (CT 03/2019), basal cell carcinoma, bronchiectasis, history of TIA.  Patient was hospitalized in August 2023 for acute C. difficile colitis during that hospitalization cardiology was consulted for new onset of A-fib she converted to normal sinus rhythm with parenteral medication and was discharged home on metoprolol and Eliquis for rate control and thromboembolic prophylaxis respectively.  Since hospital discharge she has intermittently felt shortness of breath with effort related activities, unable to lay flat at night, and when the symptoms progressed to chest tightness she went to the ED for further evaluation.  High sensitive troponins were negative x2 and BNP elevated.  She also called the office for nasal congestion and was placed on Afrin nasal spray which has significantly improved her congestion.  At the current evaluation she denies anginal discomfort or heart failure symptoms.  Collateral history also provided by the patient's husband who accompanies her at today's visit  FUNCTIONAL STATUS: No structured exercise program or daily routine.   ALLERGIES: Allergies  Allergen Reactions   Lovastatin Other (See Comments)    Unknown reaction   Mometasone Furo-Formoterol Fum Other (See Comments)    Loss of appetite, laryngitis    Prozac [Fluoxetine Hcl] Other (See Comments)    Jumpy   Sulfa Antibiotics Other (See Comments)    Unknown reaction    MEDICATION LIST PRIOR TO VISIT: Current Meds   Medication Sig   apixaban (ELIQUIS) 2.5 MG TABS tablet Take 1 tablet (2.5 mg total) by mouth 2 (two) times daily.   metoprolol succinate (TOPROL XL) 25 MG 24 hr tablet Take 1 tablet (25 mg total) by mouth every morning. Hold if systolic blood pressure (top number) less than 100 mmHg or pulse less than 60 bpm.   mupirocin ointment (BACTROBAN) 2 % Apply 1 Application topically 3 (three) times daily.   Oxymetazoline HCl 0.025 % SOLN Place into the nose.   Probiotic Product (ALIGN) 4 MG CAPS Take 1 capsule (4 mg total) by mouth daily.   [DISCONTINUED] metoprolol tartrate (LOPRESSOR) 25 MG tablet Take 1 tablet (25 mg total) by mouth 2 (two) times daily.     PAST MEDICAL HISTORY: Past Medical History:  Diagnosis Date   Adenomatous polyp of colon 2020   Allergy    Anxiety    Basal cell carcinoma (BCC)    Cataract    Depression    Helicobacter pylori gastritis    Osteopenia    Pneumonia    hx of 03/2008   Stroke [facility])    TIA long time ago    PAST SURGICAL HISTORY: Past Surgical History:  Procedure Laterality Date   APPENDECTOMY     BREAST LUMPECTOMY     right   CATARACT EXTRACTION W/ INTRAOCULAR LENS  IMPLANT, BILATERAL Bilateral    CESAREAN SECTION     CHOLECYSTECTOMY     MOHS SURGERY     PARTIAL HYSTERECTOMY     ROTATOR CUFF REPAIR  2012   SKIN GRAFT     teeth remmoved  FAMILY HISTORY: The patient family history includes Cancer in her brother and sister; Colon cancer in her son; Coronary artery disease in her brother; Dementia in her brother; Diabetes in her sister; Stomach cancer in her paternal grandmother.  SOCIAL HISTORY:  The patient  reports that she has never smoked. She has never used smokeless tobacco. She reports that she does not drink alcohol and does not use drugs.  REVIEW OF SYSTEMS: Review of Systems  Cardiovascular:  Positive for chest pain (no active symptoms since last ED visit.), orthopnea (improving) and paroxysmal nocturnal dyspnea (improving).  Negative for claudication, dyspnea on exertion, irregular heartbeat, leg swelling, near-syncope, palpitations and syncope.  Respiratory:  Positive for shortness of breath.   Hematologic/Lymphatic: Negative for bleeding problem.  Musculoskeletal:  Negative for muscle cramps and myalgias.  Neurological:  Negative for dizziness and light-headedness.    PHYSICAL EXAM:    01/05/2022    9:11 AM 01/05/2022    9:02 AM 12/26/2021    3:12 PM  Vitals with BMI  Height  '5\' 2"'$  5' 2.5"  Weight  91 lbs 3 oz 94 lbs  BMI  86.76 19.50  Systolic 932 671 245  Diastolic 63 75 52  Pulse 48 60 54    Physical Exam  Constitutional: No distress.  Age appropriate, hemodynamically stable.   Neck: No JVD present.  Cardiovascular: Normal rate, regular rhythm, S1 normal, S2 normal, intact distal pulses and normal pulses. Exam reveals no gallop, no S3 and no S4.  No murmur heard. Pulmonary/Chest: Effort normal and breath sounds normal. No stridor. She has no wheezes. She has no rales.  Abdominal: Soft. Bowel sounds are normal. She exhibits no distension. There is no abdominal tenderness.  Musculoskeletal:        General: Edema (Bilateral ankles) present.     Cervical back: Neck supple.  Neurological: She is alert and oriented to person, place, and time. She has intact cranial nerves (2-12).  Skin: Skin is warm and moist.   CARDIAC DATABASE: EKG: 12/13/2021: Normal sinus rhythm, 74 bpm, nonspecific ST-T changes, equal and PACs.   12/14/2021: Atrial fibrillation with rapid ventricular rate, 146 bpm, nonspecific ST-T changes  01/05/2022: Sinus Bradycardia, 50bpm, LAE, negative precordial T-waves.  Echocardiogram: 12/14/2021:  1. Left ventricular ejection fraction, by estimation, is 55 to 60%. The  left ventricle has normal function. The left ventricle has no regional  wall motion abnormalities. Left ventricular diastolic function could not  be evaluated.   2. Right ventricular systolic function is low normal.  The right  ventricular size is normal. There is normal pulmonary artery systolic  pressure. The estimated right ventricular systolic pressure is 80.9 mmHg.   3. The mitral valve is degenerative. Mild mitral valve regurgitation. No  evidence of mitral stenosis.   4. The aortic valve is grossly normal. Aortic valve regurgitation is not  visualized. Aortic valve sclerosis/calcification is present, without any  evidence of aortic stenosis.   5. The inferior vena cava is normal in size with greater than 50%  respiratory variability, suggesting right atrial pressure of 3 mmHg.   6. Rhythm strip during this exam demonstrates Afib RVR.   Stress Testing: No results found for this or any previous visit from the past 1095 days.  Heart Catheterization: None  Calcium Score:  04/11/2018 Coronary calcium score of 16. This was 46 rd percentile for age and sex matched control.  LABORATORY DATA:    Latest Ref Rng & Units 12/26/2021    4:09 PM 12/24/2021  3:23 AM 12/16/2021    5:21 AM  CBC  WBC 4.0 - 10.5 K/uL 7.4  9.1  8.2   Hemoglobin 12.0 - 15.0 g/dL 11.7  12.0  11.2   Hematocrit 36.0 - 46.0 % 34.5  35.7  33.2   Platelets 150.0 - 400.0 K/uL 432.0  412  154        Latest Ref Rng & Units 12/26/2021    4:09 PM 12/24/2021    3:23 AM 12/18/2021    4:46 AM  CMP  Glucose 70 - 99 mg/dL 95  99  105   BUN 6 - 23 mg/dL '21  22  11   '$ Creatinine 0.40 - 1.20 mg/dL 0.67  0.53  0.58   Sodium 135 - 145 mEq/L 134  133  138   Potassium 3.5 - 5.1 mEq/L 4.6  4.9  4.3   Chloride 96 - 112 mEq/L 98  97  106   CO2 19 - 32 mEq/L '28  27  29   '$ Calcium 8.4 - 10.5 mg/dL 9.2  9.2  8.7   Total Protein 6.0 - 8.3 g/dL 6.4  6.1    Total Bilirubin 0.2 - 1.2 mg/dL 0.4  0.5    Alkaline Phos 39 - 117 U/L 154  192    AST 0 - 37 U/L 29  32    ALT 0 - 35 U/L 49  57      Lipid Panel  Lab Results  Component Value Date   CHOL 156 12/15/2021   HDL 53 12/15/2021   LDLCALC 86 12/15/2021   LDLDIRECT 82 12/15/2021   TRIG 84  12/15/2021   CHOLHDL 2.9 12/15/2021   No components found for: "NTPROBNP" No results for input(s): "PROBNP" in the last 8760 hours. Recent Labs    04/05/21 1236 11/14/21 1042 12/14/21 1700  TSH 3.26 3.37 6.357*    BMP Recent Labs    12/16/21 0521 12/18/21 0446 12/24/21 0323 12/26/21 1609  NA 134* 138 133* 134*  K 3.6 4.3 4.9 4.6  CL 105 106 97* 98  CO2 '23 29 27 28  '$ GLUCOSE 90 105* 99 95  BUN '16 11 22 21  '$ CREATININE 0.46 0.58 0.53 0.67  CALCIUM 8.4* 8.7* 9.2 9.2  GFRNONAA >60 >60 >60  --     HEMOGLOBIN A1C No results found for: "HGBA1C", "MPG"  IMPRESSION:    ICD-10-CM   1. Precordial pain  R07.2 EKG 12-Lead    PCV MYOCARDIAL PERFUSION WO LEXISCAN    2. Shortness of breath  R06.02 furosemide (LASIX) 20 MG tablet    3. Paroxysmal atrial fibrillation (HCC)  I48.0 metoprolol succinate (TOPROL XL) 25 MG 24 hr tablet    PCV MYOCARDIAL PERFUSION WO LEXISCAN    4. History of TIA (transient ischemic attack)  Z86.73 PCV MYOCARDIAL PERFUSION WO LEXISCAN    5. Agatston coronary artery calcium score less than 100  R93.1 PCV MYOCARDIAL PERFUSION WO LEXISCAN    6. Atherosclerosis of aorta ([place])  I70.0 PCV MYOCARDIAL PERFUSION WO LEXISCAN       RECOMMENDATIONS: ANTWAN Montgomery is a 86 y.o. Caucasian female whose past medical history and cardiac risk factors include: Mild coronary artery calcification, history of C. difficile infection (2016 and 2018), Aortic Atherosclerosis (CT 03/2019), basal cell carcinoma, bronchiectasis, history of TIA.  Patient is a precordial discomfort appears to be noncardiac based on symptoms.  However symptoms were progressive and now such that she went to the ED at that time high sensitive troponins were  negative x2 and ECG negative for acute coronary syndrome.  Given her multiple cardiovascular risk factors and EKG findings the shared decision was to proceed with exercise nuclear stress test to evaluate for reversible ischemia.  Given her  symptoms of shortness of breath and orthopnea/PND we will restart her on Lasix for the next 7 days at 20 mg p.o. daily.  His symptoms are well controlled may consider putting her on Lasix chronically at 10 mg p.o. daily.  Given underlying bradycardia will reduce her Lopressor 25 mg p.o. twice daily to Toprol-XL 25 mg p.o. daily.  Patient continues to be on oral anticoagulation for thromboembolic prophylaxis.  She does not endorse evidence of bleeding.  We discussed the risks, benefits, alternatives to oral anticoagulation.  We also spent time discussing possible Watchman device as patient is not too inclined to be on blood thinners long-term.  Will revisit this at the next visit.   FINAL MEDICATION LIST END OF ENCOUNTER: Meds ordered this encounter  Medications   furosemide (LASIX) 20 MG tablet    Sig: Take 1 tablet (20 mg total) by mouth daily for 7 days.    Dispense:  7 tablet    Refill:  0   metoprolol succinate (TOPROL XL) 25 MG 24 hr tablet    Sig: Take 1 tablet (25 mg total) by mouth every morning. Hold if systolic blood pressure (top number) less than 100 mmHg or pulse less than 60 bpm.    Dispense:  90 tablet    Refill:  0    Medications Discontinued During This Encounter  Medication Reason   Ascorbic Acid (VITAMIN C PO)    Cholecalciferol (VITAMIN D3) 1000 UNITS CAPS    furosemide (LASIX) 20 MG tablet    fidaxomicin (DIFICID) 200 MG TABS tablet    metoprolol tartrate (LOPRESSOR) 25 MG tablet Dose change     Current Outpatient Medications:    apixaban (ELIQUIS) 2.5 MG TABS tablet, Take 1 tablet (2.5 mg total) by mouth 2 (two) times daily., Disp: 60 tablet, Rfl: 0   metoprolol succinate (TOPROL XL) 25 MG 24 hr tablet, Take 1 tablet (25 mg total) by mouth every morning. Hold if systolic blood pressure (top number) less than 100 mmHg or pulse less than 60 bpm., Disp: 90 tablet, Rfl: 0   mupirocin ointment (BACTROBAN) 2 %, Apply 1 Application topically 3 (three) times daily., Disp:  , Rfl:    Oxymetazoline HCl 0.025 % SOLN, Place into the nose., Disp: , Rfl:    Probiotic Product (ALIGN) 4 MG CAPS, Take 1 capsule (4 mg total) by mouth daily., Disp: 30 capsule, Rfl: 1   furosemide (LASIX) 20 MG tablet, Take 1 tablet (20 mg total) by mouth daily for 7 days., Disp: 7 tablet, Rfl: 0  Orders Placed This Encounter  Procedures   PCV MYOCARDIAL PERFUSION WO LEXISCAN   EKG 12-Lead    There are no Patient Instructions on file for this visit.   --Continue cardiac medications as reconciled in final medication list. --Return in about 4 weeks (around 02/02/2022) for Follow up, Dyspnea. or sooner if needed. --Continue follow-up with your primary care physician regarding the management of your other chronic comorbid conditions.  [name] questions and concerns were addressed to her satisfaction. She voices understanding of the instructions provided during this encounter.   This note was created using a voice recognition software as a result there may be grammatical errors inadvertently enclosed that do not reflect the nature of this encounter. Every attempt is  made to correct such errors.  Rex Kras, [place], [facility]  Pager: [phone] Office: [phone]

## 2022-01-09 ENCOUNTER — Encounter (HOSPITAL_BASED_OUTPATIENT_CLINIC_OR_DEPARTMENT_OTHER): Payer: Self-pay | Admitting: *Deleted

## 2022-01-09 ENCOUNTER — Encounter: Payer: Self-pay | Admitting: Internal Medicine

## 2022-01-09 ENCOUNTER — Emergency Department (HOSPITAL_BASED_OUTPATIENT_CLINIC_OR_DEPARTMENT_OTHER)
Admission: EM | Admit: 2022-01-09 | Discharge: 2022-01-09 | Disposition: A | Discharge status: Home / Self Care | Payer: Medicare Other | Source: Home / Self Care | Attending: Emergency Medicine | Admitting: Emergency Medicine

## 2022-01-09 ENCOUNTER — Other Ambulatory Visit: Payer: Self-pay

## 2022-01-09 DIAGNOSIS — I10 Essential (primary) hypertension: Secondary | ICD-10-CM | POA: Diagnosis present

## 2022-01-09 DIAGNOSIS — Z7901 Long term (current) use of anticoagulants: Secondary | ICD-10-CM | POA: Diagnosis not present

## 2022-01-09 DIAGNOSIS — Z9181 History of falling: Secondary | ICD-10-CM | POA: Diagnosis not present

## 2022-01-09 DIAGNOSIS — R1084 Generalized abdominal pain: Secondary | ICD-10-CM | POA: Diagnosis not present

## 2022-01-09 DIAGNOSIS — A0471 Enterocolitis due to Clostridium difficile, recurrent: Secondary | ICD-10-CM | POA: Diagnosis not present

## 2022-01-09 DIAGNOSIS — Z882 Allergy status to sulfonamides status: Secondary | ICD-10-CM | POA: Diagnosis not present

## 2022-01-09 DIAGNOSIS — A498 Other bacterial infections of unspecified site: Secondary | ICD-10-CM | POA: Diagnosis not present

## 2022-01-09 DIAGNOSIS — Z681 Body mass index (BMI) 19 or less, adult: Secondary | ICD-10-CM | POA: Diagnosis not present

## 2022-01-09 DIAGNOSIS — N281 Cyst of kidney, acquired: Secondary | ICD-10-CM | POA: Diagnosis not present

## 2022-01-09 DIAGNOSIS — Z8249 Family history of ischemic heart disease and other diseases of the circulatory system: Secondary | ICD-10-CM | POA: Diagnosis not present

## 2022-01-09 DIAGNOSIS — E872 Acidosis, unspecified: Secondary | ICD-10-CM | POA: Diagnosis not present

## 2022-01-09 DIAGNOSIS — A0472 Enterocolitis due to Clostridium difficile, not specified as recurrent: Secondary | ICD-10-CM | POA: Diagnosis not present

## 2022-01-09 DIAGNOSIS — Z79899 Other long term (current) drug therapy: Secondary | ICD-10-CM | POA: Diagnosis not present

## 2022-01-09 DIAGNOSIS — R197 Diarrhea, unspecified: Secondary | ICD-10-CM | POA: Insufficient documentation

## 2022-01-09 DIAGNOSIS — D126 Benign neoplasm of colon, unspecified: Secondary | ICD-10-CM | POA: Diagnosis not present

## 2022-01-09 DIAGNOSIS — F32A Depression, unspecified: Secondary | ICD-10-CM | POA: Diagnosis not present

## 2022-01-09 DIAGNOSIS — Z888 Allergy status to other drugs, medicaments and biological substances status: Secondary | ICD-10-CM | POA: Diagnosis not present

## 2022-01-09 DIAGNOSIS — R109 Unspecified abdominal pain: Secondary | ICD-10-CM | POA: Insufficient documentation

## 2022-01-09 DIAGNOSIS — J479 Bronchiectasis, uncomplicated: Secondary | ICD-10-CM | POA: Diagnosis not present

## 2022-01-09 DIAGNOSIS — M8580 Other specified disorders of bone density and structure, unspecified site: Secondary | ICD-10-CM | POA: Diagnosis not present

## 2022-01-09 DIAGNOSIS — Z85828 Personal history of other malignant neoplasm of skin: Secondary | ICD-10-CM | POA: Diagnosis not present

## 2022-01-09 DIAGNOSIS — E871 Hypo-osmolality and hyponatremia: Secondary | ICD-10-CM | POA: Diagnosis not present

## 2022-01-09 DIAGNOSIS — I480 Paroxysmal atrial fibrillation: Secondary | ICD-10-CM | POA: Diagnosis not present

## 2022-01-09 DIAGNOSIS — E876 Hypokalemia: Secondary | ICD-10-CM | POA: Diagnosis present

## 2022-01-09 DIAGNOSIS — Z700 Counseling related to sexual attitude: Secondary | ICD-10-CM | POA: Diagnosis not present

## 2022-01-09 DIAGNOSIS — K6389 Other specified diseases of intestine: Secondary | ICD-10-CM | POA: Diagnosis not present

## 2022-01-09 DIAGNOSIS — I2510 Atherosclerotic heart disease of native coronary artery without angina pectoris: Secondary | ICD-10-CM | POA: Diagnosis not present

## 2022-01-09 DIAGNOSIS — F419 Anxiety disorder, unspecified: Secondary | ICD-10-CM | POA: Diagnosis not present

## 2022-01-09 DIAGNOSIS — Z8673 Personal history of transient ischemic attack (TIA), and cerebral infarction without residual deficits: Secondary | ICD-10-CM | POA: Diagnosis not present

## 2022-01-09 DIAGNOSIS — E43 Unspecified severe protein-calorie malnutrition: Secondary | ICD-10-CM | POA: Diagnosis not present

## 2022-01-09 DIAGNOSIS — E860 Dehydration: Secondary | ICD-10-CM | POA: Diagnosis not present

## 2022-01-09 LAB — COMPREHENSIVE METABOLIC PANEL
ALT: 24 U/L (ref 0–44)
AST: 21 U/L (ref 15–41)
Albumin: 4.2 g/dL (ref 3.5–5.0)
Alkaline Phosphatase: 126 U/L (ref 38–126)
Anion gap: 11 (ref 5–15)
BUN: 21 mg/dL (ref 8–23)
CO2: 25 mmol/L (ref 22–32)
Calcium: 10 mg/dL (ref 8.9–10.3)
Chloride: 98 mmol/L (ref 98–111)
Creatinine, Ser: 0.66 mg/dL (ref 0.44–1.00)
GFR, Estimated: >60 mL/min (ref >60)
Glucose, Bld: 115 mg/dL — ABNORMAL HIGH (ref 70–99)
Potassium: 4 mmol/L (ref 3.5–5.1)
Sodium: 134 mmol/L — ABNORMAL LOW (ref 135–145)
Total Bilirubin: 0.6 mg/dL (ref 0.3–1.2)
Total Protein: 7.2 g/dL (ref 6.5–8.1)

## 2022-01-09 LAB — URINALYSIS, ROUTINE W REFLEX MICROSCOPIC
Bilirubin Urine: NEGATIVE
Glucose, UA: NEGATIVE mg/dL
Ketones, ur: NEGATIVE mg/dL
Nitrite: NEGATIVE
Protein, ur: NEGATIVE mg/dL
Specific Gravity, Urine: 1.017 (ref 1.005–1.030)
pH: 5 (ref 5.0–8.0)

## 2022-01-09 LAB — CBC
HCT: 37.5 % (ref 36.0–46.0)
Hemoglobin: 12.6 g/dL (ref 12.0–15.0)
MCH: 31.6 pg (ref 26.0–34.0)
MCHC: 33.6 g/dL (ref 30.0–36.0)
MCV: 94 fL (ref 80.0–100.0)
Platelets: 319 10*3/uL (ref 150–400)
RBC: 3.99 MIL/uL (ref 3.87–5.11)
RDW: 13.2 % (ref 11.5–15.5)
WBC: 9.9 10*3/uL (ref 4.0–10.5)
nRBC: 0 % (ref 0.0–0.2)

## 2022-01-09 LAB — LIPASE, BLOOD: Lipase: 24 U/L (ref 11–51)

## 2022-01-09 NOTE — Discharge Instructions (Signed)
Follow-up with your primary doctor in 2 to 3 days regarding your symptoms.  Return to the emergency department if you experience any of the following: Abdominal pain, bloody stool, dark tarry stool, fevers, or any other concerning symptoms.

## 2022-01-09 NOTE — ED Provider Notes (Signed)
[facility] EMERGENCY DEPT Provider Note   CSN: [id number] Arrival date & time: 01/09/22  1708     History {Add pertinent medical, surgical, social history, OB history to HPI:1} Chief Complaint  Patient presents with   Diarrhea    Julie Montgomery is a 86 y.o. female.  86 year old female with a history of C. difficile who presents to the emergency department with diarrhea.  Patient states that yesterday she started having loose stools.  Says that this morning she had 6 loose stools which resolved spontaneously.  Denies any melena or hematochezia.  Says that she has had mild abdominal cramping but denies any abdominal pain.  No nausea or vomiting.  No fevers.  No recent travel.  No dizziness or lightheadedness.   Diarrhea      Home Medications Prior to Admission medications   Medication Sig Start Date End Date Taking? Authorizing Provider  apixaban (ELIQUIS) 2.5 MG TABS tablet Take 1 tablet (2.5 mg total) by mouth 2 (two) times daily. 12/18/21   Caren Griffins, MD  furosemide (LASIX) 20 MG tablet Take 1 tablet (20 mg total) by mouth daily for 7 days. 01/05/22 01/12/22  Tolia, Sunit, DO  metoprolol succinate (TOPROL XL) 25 MG 24 hr tablet Take 1 tablet (25 mg total) by mouth every morning. Hold if systolic blood pressure (top number) less than 100 mmHg or pulse less than 60 bpm. 01/05/22 04/05/22  Tolia, Sunit, DO  mupirocin ointment (BACTROBAN) 2 % Apply 1 Application topically 3 (three) times daily. 01/02/22   [provider]  Oxymetazoline HCl 0.025 % SOLN Place into the nose.    [provider]  Probiotic Product (ALIGN) 4 MG CAPS Take 1 capsule (4 mg total) by mouth daily. 12/26/21   Plotnikov, Evie Lacks, MD      Allergies    Lovastatin, Mometasone furo-formoterol fum, Prozac [fluoxetine hcl], and Sulfa antibiotics    Review of Systems   Review of Systems  Gastrointestinal:  Positive for diarrhea.    Physical Exam Updated Vital Signs BP (!)  137/48 (BP Location: Right Arm)   Pulse 70   Temp 98.3 F (36.8 C) (Oral)   Resp 16   Wt 41.3 kg   SpO2 94%   BMI 16.64 kg/m  Physical Exam  ED Results / Procedures / Treatments   Labs (all labs ordered are listed, but only abnormal results are displayed) Labs Reviewed  COMPREHENSIVE METABOLIC PANEL - Abnormal; Notable for the following components:      Result Value   Sodium 134 (*)    Glucose, Bld 115 (*)    All other components within normal limits  URINALYSIS, ROUTINE W REFLEX MICROSCOPIC - Abnormal; Notable for the following components:   Hgb urine dipstick TRACE (*)    Leukocytes,Ua LARGE (*)    All other components within normal limits  LIPASE, BLOOD  CBC    EKG None  Radiology No results found.  Procedures Procedures  {Document cardiac monitor, telemetry assessment procedure when appropriate:1}  Medications Ordered in ED Medications - No data to display  ED Course/ Medical Decision Making/ A&P                           Medical Decision Making Amount and/or Complexity of Data Reviewed Labs: ordered.   ***  {Document critical care time when appropriate:1} {Document review of labs and clinical decision tools ie heart score, Chads2Vasc2 etc:1}  {Document your independent review of radiology  images, and any outside records:1} {Document your discussion with family members, caretakers, and with consultants:1} {Document social determinants of health affecting pt's care:1} {Document your decision making why or why not admission, treatments were needed:1} Final Clinical Impression(s) / ED Diagnoses Final diagnoses:  None    Rx / DC Orders ED Discharge Orders     None

## 2022-01-09 NOTE — ED Notes (Signed)
Discharge paperwork given and verbally understood. 

## 2022-01-09 NOTE — ED Triage Notes (Signed)
Pt  is here from home for diarrhea.  Pt reports that she had 6 episodes uncontrollable watery diarrhea.  Pt reports some abdominal cramping before having diarrhea, no fever and no pain or burning with urination.

## 2022-01-10 ENCOUNTER — Ambulatory Visit: Payer: Medicare Other | Admitting: Physician Assistant

## 2022-01-10 ENCOUNTER — Telehealth: Payer: Self-pay

## 2022-01-10 ENCOUNTER — Emergency Department (HOSPITAL_BASED_OUTPATIENT_CLINIC_OR_DEPARTMENT_OTHER)
Admission: EM | Admit: 2022-01-10 | Discharge: 2022-01-10 | Disposition: A | Discharge status: Home / Self Care | Payer: Medicare Other | Source: Home / Self Care | Attending: Emergency Medicine | Admitting: Emergency Medicine

## 2022-01-10 ENCOUNTER — Encounter (HOSPITAL_BASED_OUTPATIENT_CLINIC_OR_DEPARTMENT_OTHER): Payer: Self-pay

## 2022-01-10 ENCOUNTER — Other Ambulatory Visit: Payer: Self-pay

## 2022-01-10 ENCOUNTER — Telehealth: Payer: Self-pay | Admitting: *Deleted

## 2022-01-10 DIAGNOSIS — Z79899 Other long term (current) drug therapy: Secondary | ICD-10-CM | POA: Insufficient documentation

## 2022-01-10 DIAGNOSIS — I4891 Unspecified atrial fibrillation: Secondary | ICD-10-CM | POA: Insufficient documentation

## 2022-01-10 DIAGNOSIS — E871 Hypo-osmolality and hyponatremia: Secondary | ICD-10-CM | POA: Insufficient documentation

## 2022-01-10 DIAGNOSIS — Z7901 Long term (current) use of anticoagulants: Secondary | ICD-10-CM | POA: Insufficient documentation

## 2022-01-10 DIAGNOSIS — D649 Anemia, unspecified: Secondary | ICD-10-CM | POA: Insufficient documentation

## 2022-01-10 DIAGNOSIS — R197 Diarrhea, unspecified: Secondary | ICD-10-CM | POA: Insufficient documentation

## 2022-01-10 LAB — CBC WITH DIFFERENTIAL/PLATELET
Abs Immature Granulocytes: 0.02 10*3/uL (ref 0.00–0.07)
Basophils Absolute: 0 10*3/uL (ref 0.0–0.1)
Basophils Relative: 0 %
Eosinophils Absolute: 0 10*3/uL (ref 0.0–0.5)
Eosinophils Relative: 1 %
HCT: 34.6 % — ABNORMAL LOW (ref 36.0–46.0)
Hemoglobin: 11.5 g/dL — ABNORMAL LOW (ref 12.0–15.0)
Immature Granulocytes: 0 %
Lymphocytes Relative: 10 %
Lymphs Abs: 0.6 10*3/uL — ABNORMAL LOW (ref 0.7–4.0)
MCH: 31.3 pg (ref 26.0–34.0)
MCHC: 33.2 g/dL (ref 30.0–36.0)
MCV: 94 fL (ref 80.0–100.0)
Monocytes Absolute: 0.5 10*3/uL (ref 0.1–1.0)
Monocytes Relative: 8 %
Neutro Abs: 4.7 10*3/uL (ref 1.7–7.7)
Neutrophils Relative %: 81 %
Platelets: 241 10*3/uL (ref 150–400)
RBC: 3.68 MIL/uL — ABNORMAL LOW (ref 3.87–5.11)
RDW: 13.2 % (ref 11.5–15.5)
WBC: 5.8 10*3/uL (ref 4.0–10.5)
nRBC: 0 % (ref 0.0–0.2)

## 2022-01-10 LAB — COMPREHENSIVE METABOLIC PANEL
ALT: 20 U/L (ref 0–44)
AST: 18 U/L (ref 15–41)
Albumin: 3.7 g/dL (ref 3.5–5.0)
Alkaline Phosphatase: 106 U/L (ref 38–126)
Anion gap: 8 (ref 5–15)
BUN: 21 mg/dL (ref 8–23)
CO2: 27 mmol/L (ref 22–32)
Calcium: 9.4 mg/dL (ref 8.9–10.3)
Chloride: 99 mmol/L (ref 98–111)
Creatinine, Ser: 0.65 mg/dL (ref 0.44–1.00)
GFR, Estimated: >60 mL/min (ref >60)
Glucose, Bld: 105 mg/dL — ABNORMAL HIGH (ref 70–99)
Potassium: 4.2 mmol/L (ref 3.5–5.1)
Sodium: 134 mmol/L — ABNORMAL LOW (ref 135–145)
Total Bilirubin: 0.5 mg/dL (ref 0.3–1.2)
Total Protein: 6.3 g/dL — ABNORMAL LOW (ref 6.5–8.1)

## 2022-01-10 LAB — C DIFFICILE QUICK SCREEN W PCR REFLEX
C Diff antigen: POSITIVE — AB
C Diff toxin: NEGATIVE

## 2022-01-10 LAB — CLOSTRIDIUM DIFFICILE BY PCR, REFLEXED: Toxigenic C. Difficile by PCR: POSITIVE — AB

## 2022-01-10 LAB — CBG MONITORING, ED: Glucose-Capillary: 94 mg/dL (ref 70–99)

## 2022-01-10 LAB — LIPASE, BLOOD: Lipase: 27 U/L (ref 11–51)

## 2022-01-10 MED ORDER — SODIUM CHLORIDE 0.9 % IV BOLUS
500.0000 mL | Freq: Once | INTRAVENOUS | Status: AC
  Administered 2022-01-10: 500 mL via INTRAVENOUS

## 2022-01-10 NOTE — Discharge Instructions (Signed)
You were seen in the emergency department today for diarrhea.  Your labs looked good today.  We gave you some fluids for possible dehydration.  You need to follow-up with your primary care provider this week for recheck on your symptoms by Friday afternoon.  Please return if you are having worsening diarrhea or not able to drink liquids.

## 2022-01-10 NOTE — ED Provider Notes (Signed)
[facility] EMERGENCY DEPT Provider Note   CSN: [id number] Arrival date & time: 01/10/22  1151     History  Chief Complaint  Patient presents with   Diarrhea   Follow-up    Julie Montgomery is a 86 y.o. female.  With past medical history of stroke, osteopenia, paroxysmal atrial fibrillation, history of C. difficile who presents to the emergency department with diarrhea.  Patient states that this morning she was feeling okay but had a single episode of diarrhea.  She states that she ate only a small amount of food at a fear of having more diarrhea.  She is "trying to figure out what is sparking the diarrhea."  States that she called her primary care at this morning to try and make an appointment but they did not have 1 instructed her to present to the emergency department.  She was here yesterday for the same complaint.  States yesterday morning she ate breakfast and then had about 6 episodes of diarrhea.  She denies any melena, hematochezia.  She denies having any abdominal pain, cramping, nausea or vomiting.  She denies any dysuria.  Denies lightheadedness or dizziness, thirst, shortness of breath.  Remotely she states that she has had diarrhea on and off for the past "several months."  States that she has tried changing her diet but ends up with diarrhea and dehydration requiring IV fluids.  She was admitted on 12/12/2021 for C. difficile infection.  Was placed on Fidaxomicin. On follow up on 12/19/21 she had improved symptoms.    Diarrhea Associated symptoms: no abdominal pain, no fever and no vomiting        Home Medications Prior to Admission medications   Medication Sig Start Date End Date Taking? Authorizing Provider  apixaban (ELIQUIS) 2.5 MG TABS tablet Take 1 tablet (2.5 mg total) by mouth 2 (two) times daily. 12/18/21   Caren Griffins, MD  furosemide (LASIX) 20 MG tablet Take 1 tablet (20 mg total) by mouth daily for 7 days. 01/05/22 01/12/22  Tolia, Sunit, DO   metoprolol succinate (TOPROL XL) 25 MG 24 hr tablet Take 1 tablet (25 mg total) by mouth every morning. Hold if systolic blood pressure (top number) less than 100 mmHg or pulse less than 60 bpm. 01/05/22 04/05/22  Tolia, Sunit, DO  mupirocin ointment (BACTROBAN) 2 % Apply 1 Application topically 3 (three) times daily. 01/02/22   [provider]  Oxymetazoline HCl 0.025 % SOLN Place into the nose.    [provider]  Probiotic Product (ALIGN) 4 MG CAPS Take 1 capsule (4 mg total) by mouth daily. 12/26/21   Plotnikov, Evie Lacks, MD      Allergies    Lovastatin, Mometasone furo-formoterol fum, Prozac [fluoxetine hcl], and Sulfa antibiotics    Review of Systems   Review of Systems  Constitutional:  Negative for fever.  Respiratory:  Negative for shortness of breath.   Gastrointestinal:  Positive for diarrhea. Negative for abdominal pain, anal bleeding, blood in stool, nausea, rectal pain and vomiting.  Endocrine: Negative for polydipsia.  Genitourinary:  Negative for dysuria.  All other systems reviewed and are negative.   Physical Exam Updated Vital Signs BP 136/62 (BP Location: Right Arm)   Pulse (!) 54   Temp 98.2 F (36.8 C) (Oral)   Resp 16   SpO2 93%  Physical Exam Vitals and nursing note reviewed.  Constitutional:      General: She is not in acute distress.    Appearance: Normal appearance.  She is normal weight. She is not ill-appearing or toxic-appearing.  HENT:     Head: Normocephalic and atraumatic.     Mouth/Throat:     Mouth: Mucous membranes are dry.     Pharynx: Oropharynx is clear.  Eyes:     General: No scleral icterus.    Extraocular Movements: Extraocular movements intact.     Comments: Conjunctiva pale   Cardiovascular:     Pulses: Normal pulses.     Heart sounds: Normal heart sounds. No murmur heard. Pulmonary:     Effort: Pulmonary effort is normal. No respiratory distress.     Breath sounds: Normal breath sounds.  Abdominal:     General:  Abdomen is flat. Bowel sounds are normal. There is no distension.     Palpations: Abdomen is soft.     Tenderness: There is abdominal tenderness in the epigastric area. There is no guarding or rebound.     Comments: Mild epigastric tenderness on exam.   Musculoskeletal:        General: Normal range of motion.     Cervical back: Neck supple.  Skin:    General: Skin is warm and dry.     Capillary Refill: Capillary refill takes less than 2 seconds.     Findings: No rash.  Neurological:     General: No focal deficit present.     Mental Status: She is alert and oriented to person, place, and time. Mental status is at baseline.  Psychiatric:        Mood and Affect: Mood normal.        Behavior: Behavior normal.        Thought Content: Thought content normal.        Judgment: Judgment normal.    ED Results / Procedures / Treatments   Labs (all labs ordered are listed, but only abnormal results are displayed) Labs Reviewed  COMPREHENSIVE METABOLIC PANEL - Abnormal; Notable for the following components:      Result Value   Sodium 134 (*)    Glucose, Bld 105 (*)    Total Protein 6.3 (*)    All other components within normal limits  CBC WITH DIFFERENTIAL/PLATELET - Abnormal; Notable for the following components:   RBC 3.68 (*)    Hemoglobin 11.5 (*)    HCT 34.6 (*)    Lymphs Abs 0.6 (*)    All other components within normal limits  C DIFFICILE QUICK SCREEN W PCR REFLEX    LIPASE, BLOOD  CBG MONITORING, ED    EKG None  Radiology No results found.  Procedures Procedures    Medications Ordered in ED Medications  sodium chloride 0.9 % bolus 500 mL (0 mLs Intravenous Stopped 01/10/22 1500)    ED Course/ Medical Decision Making/ A&P                           Medical Decision Making Amount and/or Complexity of Data Reviewed Labs: ordered.  This patient presents to the ED with chief complaint(s) of diarrhea with pertinent past medical history of c difficile, stroke,  osteopenia which further complicates the presenting complaint. The complaint involves an extensive differential diagnosis and also carries with it a high risk of complications and morbidity.    The differential diagnosis includes viral gastroenteritis, infectious diarrhea, c.difficile, etc.    Additional history obtained: Additional history obtained from  none available Records reviewed Care Everywhere/External Records and Primary Care Documents ED physician note from yesterday,  telephone notes from today.   ED Course and Reassessment: 86 year old female who presents to the emergency department with diarrhea.  Physical exam is unremarkable.  There is no peritonitic findings.  She is nonseptic, nontoxic in appearance.  No fevers.  Labs are reassuring here she has a very mildly decreased sodium of 134.  She is slightly pale conjunctivae.  She is not tachycardic, hypotensive or symptomatic.  No leukocytosis, fever concerning for infectious etiology Nonbloody diarrhea No transaminitis or elevated bilirubin concerning for acute hepatobiliary disease.  Lipase negative, doubt acute pancreatitis. No AKI or electrolyte derangement.  Attempted to obtain a C. Difficile given previous infectious with same.  She did have another episode of diarrhea however there was too much urine that was mixed in with this to obtain an accurate sample. There was concern for her being dehydrated.  She was given 557m IVF bolus. She is tolerating PO at home. Feel she can continue to drink fluids in the outpatient setting.  She will need close follow-up with PCP for ongoing management of her symptoms.   Do not feel she needs anti-diarrheal medication at this time. Should have follow-up with PCP this week for recheck of symptoms.   Independent labs interpretation:  The following labs were independently interpreted:  CBC with anemia of 11.5, stable CMP with mildly decreased sodium Glucose normal, lipase negative C.  difficile unable to obtain  Independent visualization of imaging: - I independently visualized the following imaging with scope of interpretation limited to determining acute life threatening conditions related to emergency care: Not indicated  Consultation: - Consulted or discussed management/test interpretation w/ external professional: Not indicated   Consideration for admission or further workup: consider CT A/P with contrast, whoever reassuring physical exam and labs. Will have PCP follow-up Social Determinants of health: none identified  Final Clinical Impression(s) / ED Diagnoses Final diagnoses:  Diarrhea, unspecified type    Rx / DC Orders ED Discharge Orders     None         AMickie Hillier PA-C 01/10/22 1523    LRegan Lemming MD 01/10/22 2139

## 2022-01-10 NOTE — Telephone Encounter (Signed)
Have her stop Toprol XL (started at last visit - I was trying to transition her from Lopressor to Toprol XL). Diarrhea should stop if its due to Toprol XL.  But she has history of C.diff which can cause diarrhea as well. Have her follow up w/ PCP or go to ER if diarrhea does not improve or has symptoms to lightheaded, dizzy, syncope.   Dr. Terri Skains

## 2022-01-10 NOTE — ED Notes (Signed)
Discharge paperwork given and verbally understood. 

## 2022-01-10 NOTE — Telephone Encounter (Signed)
Spoke to pt told her Ald[facility] has reviewed your chart and would like you to go back to the ED due to symptoms and you will probably need IV fluids. Pt verbalized understanding and will go to the ED.

## 2022-01-10 NOTE — Telephone Encounter (Signed)
Tried calling patient no answer left a vm to call us back

## 2022-01-10 NOTE — ED Triage Notes (Signed)
Pt states that her PCP told her to come back to the ER. Pt states that she is feeling better than yesterday. Pt did have 1 episode of diarrhea, but is not reporting any dizziness. Pt ambulatory to room without issue.

## 2022-01-10 NOTE — ED Notes (Signed)
Pt informed of the need for a stool sample. Unable to at the moment.

## 2022-01-11 ENCOUNTER — Other Ambulatory Visit: Payer: Self-pay

## 2022-01-11 ENCOUNTER — Emergency Department (HOSPITAL_COMMUNITY): Payer: Medicare Other

## 2022-01-11 ENCOUNTER — Inpatient Hospital Stay (HOSPITAL_COMMUNITY)
Admission: EM | Admit: 2022-01-11 | Discharge: 2022-01-13 | DRG: 371 | Disposition: A | Discharge status: Home / Self Care | Payer: Medicare Other | Attending: Student | Admitting: Student

## 2022-01-11 DIAGNOSIS — Z85828 Personal history of other malignant neoplasm of skin: Secondary | ICD-10-CM

## 2022-01-11 DIAGNOSIS — Z8249 Family history of ischemic heart disease and other diseases of the circulatory system: Secondary | ICD-10-CM | POA: Diagnosis not present

## 2022-01-11 DIAGNOSIS — Z7901 Long term (current) use of anticoagulants: Secondary | ICD-10-CM | POA: Diagnosis not present

## 2022-01-11 DIAGNOSIS — R1084 Generalized abdominal pain: Secondary | ICD-10-CM | POA: Diagnosis not present

## 2022-01-11 DIAGNOSIS — Z79899 Other long term (current) drug therapy: Secondary | ICD-10-CM

## 2022-01-11 DIAGNOSIS — Z888 Allergy status to other drugs, medicaments and biological substances status: Secondary | ICD-10-CM | POA: Diagnosis not present

## 2022-01-11 DIAGNOSIS — A498 Other bacterial infections of unspecified site: Secondary | ICD-10-CM | POA: Diagnosis not present

## 2022-01-11 DIAGNOSIS — Z681 Body mass index (BMI) 19 or less, adult: Secondary | ICD-10-CM

## 2022-01-11 DIAGNOSIS — Z882 Allergy status to sulfonamides status: Secondary | ICD-10-CM | POA: Diagnosis not present

## 2022-01-11 DIAGNOSIS — E46 Unspecified protein-calorie malnutrition: Secondary | ICD-10-CM | POA: Diagnosis present

## 2022-01-11 DIAGNOSIS — I480 Paroxysmal atrial fibrillation: Secondary | ICD-10-CM | POA: Diagnosis present

## 2022-01-11 DIAGNOSIS — N281 Cyst of kidney, acquired: Secondary | ICD-10-CM | POA: Diagnosis not present

## 2022-01-11 DIAGNOSIS — I10 Essential (primary) hypertension: Secondary | ICD-10-CM | POA: Diagnosis present

## 2022-01-11 DIAGNOSIS — K6389 Other specified diseases of intestine: Secondary | ICD-10-CM | POA: Diagnosis not present

## 2022-01-11 DIAGNOSIS — A0472 Enterocolitis due to Clostridium difficile, not specified as recurrent: Secondary | ICD-10-CM | POA: Diagnosis not present

## 2022-01-11 DIAGNOSIS — E876 Hypokalemia: Secondary | ICD-10-CM | POA: Diagnosis present

## 2022-01-11 DIAGNOSIS — E872 Acidosis, unspecified: Secondary | ICD-10-CM | POA: Diagnosis present

## 2022-01-11 DIAGNOSIS — E871 Hypo-osmolality and hyponatremia: Secondary | ICD-10-CM | POA: Diagnosis present

## 2022-01-11 DIAGNOSIS — E43 Unspecified severe protein-calorie malnutrition: Secondary | ICD-10-CM | POA: Diagnosis present

## 2022-01-11 DIAGNOSIS — E860 Dehydration: Secondary | ICD-10-CM | POA: Diagnosis present

## 2022-01-11 DIAGNOSIS — Z8673 Personal history of transient ischemic attack (TIA), and cerebral infarction without residual deficits: Secondary | ICD-10-CM | POA: Diagnosis not present

## 2022-01-11 DIAGNOSIS — J479 Bronchiectasis, uncomplicated: Secondary | ICD-10-CM | POA: Diagnosis present

## 2022-01-11 DIAGNOSIS — R0602 Shortness of breath: Secondary | ICD-10-CM

## 2022-01-11 DIAGNOSIS — A0471 Enterocolitis due to Clostridium difficile, recurrent: Secondary | ICD-10-CM | POA: Diagnosis present

## 2022-01-11 LAB — COMPREHENSIVE METABOLIC PANEL
ALT: 24 U/L (ref 0–44)
AST: 26 U/L (ref 15–41)
Albumin: 3 g/dL — ABNORMAL LOW (ref 3.5–5.0)
Alkaline Phosphatase: 105 U/L (ref 38–126)
Anion gap: 7 (ref 5–15)
BUN: 12 mg/dL (ref 8–23)
CO2: 24 mmol/L (ref 22–32)
Calcium: 8.8 mg/dL — ABNORMAL LOW (ref 8.9–10.3)
Chloride: 101 mmol/L (ref 98–111)
Creatinine, Ser: 0.71 mg/dL (ref 0.44–1.00)
GFR, Estimated: >60 mL/min (ref >60)
Glucose, Bld: 109 mg/dL — ABNORMAL HIGH (ref 70–99)
Potassium: 3.6 mmol/L (ref 3.5–5.1)
Sodium: 132 mmol/L — ABNORMAL LOW (ref 135–145)
Total Bilirubin: 0.4 mg/dL (ref 0.3–1.2)
Total Protein: 6.2 g/dL — ABNORMAL LOW (ref 6.5–8.1)

## 2022-01-11 LAB — URINALYSIS, ROUTINE W REFLEX MICROSCOPIC
Bilirubin Urine: NEGATIVE
Glucose, UA: NEGATIVE mg/dL
Ketones, ur: 15 mg/dL — AB
Nitrite: NEGATIVE
Protein, ur: NEGATIVE mg/dL
Specific Gravity, Urine: 1.025 (ref 1.005–1.030)
pH: 5.5 (ref 5.0–8.0)

## 2022-01-11 LAB — URINALYSIS, MICROSCOPIC (REFLEX)

## 2022-01-11 LAB — CBC WITH DIFFERENTIAL/PLATELET
Abs Immature Granulocytes: 0.02 10*3/uL (ref 0.00–0.07)
Basophils Absolute: 0 10*3/uL (ref 0.0–0.1)
Basophils Relative: 0 %
Eosinophils Absolute: 0 10*3/uL (ref 0.0–0.5)
Eosinophils Relative: 1 %
HCT: 36.4 % (ref 36.0–46.0)
Hemoglobin: 12.2 g/dL (ref 12.0–15.0)
Immature Granulocytes: 0 %
Lymphocytes Relative: 8 %
Lymphs Abs: 0.7 10*3/uL (ref 0.7–4.0)
MCH: 32 pg (ref 26.0–34.0)
MCHC: 33.5 g/dL (ref 30.0–36.0)
MCV: 95.5 fL (ref 80.0–100.0)
Monocytes Absolute: 0.8 10*3/uL (ref 0.1–1.0)
Monocytes Relative: 10 %
Neutro Abs: 6.9 10*3/uL (ref 1.7–7.7)
Neutrophils Relative %: 81 %
Platelets: 263 10*3/uL (ref 150–400)
RBC: 3.81 MIL/uL — ABNORMAL LOW (ref 3.87–5.11)
RDW: 13.1 % (ref 11.5–15.5)
WBC: 8.5 10*3/uL (ref 4.0–10.5)
nRBC: 0 % (ref 0.0–0.2)

## 2022-01-11 MED ORDER — HYDRALAZINE HCL 25 MG PO TABS: 25.0000 mg | ORAL_TABLET | Freq: Four times a day (QID) | ORAL | Status: DC | PRN

## 2022-01-11 MED ORDER — ENOXAPARIN SODIUM 30 MG/0.3ML IJ SOSY
30.0000 mg | PREFILLED_SYRINGE | INTRAMUSCULAR | Status: DC
  Administered 2022-01-11 – 2022-01-12 (×2): 30 mg via SUBCUTANEOUS
  Filled 2022-01-11 (×2): qty 0.3

## 2022-01-11 MED ORDER — ALIGN 4 MG PO CAPS
1.0000 | ORAL_CAPSULE | Freq: Every day | ORAL | Status: DC
  Filled 2022-01-11: qty 1

## 2022-01-11 MED ORDER — FIDAXOMICIN 200 MG PO TABS
200.0000 mg | ORAL_TABLET | Freq: Two times a day (BID) | ORAL | Status: DC
  Administered 2022-01-11 – 2022-01-13 (×4): 200 mg via ORAL
  Filled 2022-01-11 (×5): qty 1

## 2022-01-11 MED ORDER — POTASSIUM CHLORIDE CRYS ER 20 MEQ PO TBCR
40.0000 meq | EXTENDED_RELEASE_TABLET | Freq: Once | ORAL | Status: AC
  Administered 2022-01-11: 40 meq via ORAL
  Filled 2022-01-11: qty 2

## 2022-01-11 MED ORDER — ACETAMINOPHEN 325 MG PO TABS
650.0000 mg | ORAL_TABLET | Freq: Four times a day (QID) | ORAL | Status: DC | PRN
  Administered 2022-01-13: 650 mg via ORAL
  Filled 2022-01-11: qty 2

## 2022-01-11 MED ORDER — ACETAMINOPHEN 650 MG RE SUPP: 650.0000 mg | Freq: Four times a day (QID) | RECTAL | Status: DC | PRN

## 2022-01-11 MED ORDER — LACTATED RINGERS IV BOLUS
1000.0000 mL | Freq: Once | INTRAVENOUS | Status: AC
  Administered 2022-01-11: 1000 mL via INTRAVENOUS

## 2022-01-11 MED ORDER — SODIUM CHLORIDE 0.9 % IV SOLN: INTRAVENOUS | Status: AC

## 2022-01-11 MED ORDER — IOHEXOL 350 MG/ML SOLN
75.0000 mL | Freq: Once | INTRAVENOUS | Status: AC | PRN
  Administered 2022-01-11: 75 mL via INTRAVENOUS

## 2022-01-11 MED ORDER — MUPIROCIN 2 % EX OINT
1.0000 | TOPICAL_OINTMENT | Freq: Three times a day (TID) | CUTANEOUS | Status: DC
  Administered 2022-01-12 – 2022-01-13 (×3): 1 via TOPICAL
  Filled 2022-01-11 (×2): qty 22

## 2022-01-11 MED ORDER — APIXABAN 2.5 MG PO TABS: 2.5000 mg | ORAL_TABLET | Freq: Two times a day (BID) | ORAL | Status: DC

## 2022-01-11 MED ORDER — ONDANSETRON HCL 4 MG/2ML IJ SOLN: 4.0000 mg | Freq: Four times a day (QID) | INTRAMUSCULAR | Status: DC | PRN

## 2022-01-11 NOTE — ED Provider Notes (Cosign Needed Addendum)
[facility] EMERGENCY DEPARTMENT Provider Note   CSN: [id number] Arrival date & time: 01/11/22  3976     History  Chief Complaint  Patient presents with   Abdominal Pain   Diarrhea   C-Diff    Julie Montgomery is a 86 y.o. female.  With a PMH of recurrent C diff infections (2016, 2018, and most recently 8/15 2023), TIA, recently diagnosed afib on metoprolol and eliquis.   Patient was hospitalized 8/15-8/21 with diarrhea thought to be due to c diff infection after her PCR came back positive and dehydration. She had a course of kelfex for suspected cellulitis.   She was treated with fidaxomicin during that hospitalization and states that her symptoms resolved before being discharged.   She developed diffuse abdominal pain and approximately 8 loose stools a day about 4 days prior to presenting to the ED today.  She eventually went to the ED yesterday and repeat C. difficile test was positive.  She was instructed to present to the ED if her symptoms persisted.  She states that she has not had much of an appetite, eating very little as this seems to increase the frequency of her bowel movements.  She has however been able to keep up with her fluid intake and states that she has urinated without any difficulty.  She denies nausea, vomiting, fever, chills, chest pain, and lower extremity swelling.   Abdominal Pain Associated symptoms: diarrhea   Diarrhea Associated symptoms: abdominal pain        Home Medications Prior to Admission medications   Medication Sig Start Date End Date Taking? Authorizing Provider  apixaban (ELIQUIS) 2.5 MG TABS tablet Take 1 tablet (2.5 mg total) by mouth 2 (two) times daily. 12/18/21   Caren Griffins, MD  furosemide (LASIX) 20 MG tablet Take 1 tablet (20 mg total) by mouth daily for 7 days. 01/05/22 01/12/22  Tolia, Sunit, DO  metoprolol succinate (TOPROL XL) 25 MG 24 hr tablet Take 1 tablet (25 mg total) by mouth every morning. Hold  if systolic blood pressure (top number) less than 100 mmHg or pulse less than 60 bpm. 01/05/22 04/05/22  Tolia, Sunit, DO  mupirocin ointment (BACTROBAN) 2 % Apply 1 Application topically 3 (three) times daily. 01/02/22   [provider]  Oxymetazoline HCl 0.025 % SOLN Place into the nose.    [provider]  Probiotic Product (ALIGN) 4 MG CAPS Take 1 capsule (4 mg total) by mouth daily. 12/26/21   Plotnikov, Evie Lacks, MD      Allergies    Lovastatin, Mometasone furo-formoterol fum, Prozac [fluoxetine hcl], and Sulfa antibiotics    Review of Systems   Review of Systems  Gastrointestinal:  Positive for abdominal pain and diarrhea.  All other systems reviewed and are negative.   Physical Exam Updated Vital Signs BP 122/67 (BP Location: Left Arm)   Pulse 77   Temp 98.4 F (36.9 C) (Oral)   Resp 17   Ht '5\' 2"'$  (1.575 m)   Wt 40.4 kg   SpO2 97%   BMI 16.28 kg/m  Physical Exam Constitutional:      General: She is not in acute distress.    Appearance: She is not ill-appearing.     Comments: thin  Cardiovascular:     Rate and Rhythm: Normal rate and regular rhythm.     Heart sounds: No murmur heard. Pulmonary:     Effort: Pulmonary effort is normal.     Breath sounds: Normal breath  sounds.  Abdominal:     General: There is no distension.     Palpations: Abdomen is soft.     Tenderness: There is generalized abdominal tenderness. There is no guarding or rebound.  Skin:    General: Skin is warm and dry.  Neurological:     General: No focal deficit present.  Psychiatric:        Mood and Affect: Mood normal.     ED Results / Procedures / Treatments   Labs (all labs ordered are listed, but only abnormal results are displayed) Labs Reviewed  COMPREHENSIVE METABOLIC PANEL - Abnormal; Notable for the following components:      Result Value   Sodium 132 (*)    Glucose, Bld 109 (*)    Calcium 8.8 (*)    Total Protein 6.2 (*)    Albumin 3.0 (*)    All other  components within normal limits  CBC WITH DIFFERENTIAL/PLATELET - Abnormal; Notable for the following components:   RBC 3.81 (*)    All other components within normal limits  URINALYSIS, ROUTINE W REFLEX MICROSCOPIC - Abnormal; Notable for the following components:   Hgb urine dipstick SMALL (*)    Ketones, ur 15 (*)    Leukocytes,Ua SMALL (*)    All other components within normal limits  URINALYSIS, MICROSCOPIC (REFLEX) - Abnormal; Notable for the following components:   Bacteria, UA RARE (*)    All other components within normal limits    EKG None  Radiology No results found.    Medications Ordered in ED Medications - No data to display  ED Course/ Medical Decision Making/ A&P                           Medical Decision Making Patient presents with worsening diarrhea over the last 4 days in the setting of previous C. difficile infections, most recently noted to have C. difficile infection 8/15 which she was treated with a course of fidaxomicin.  Her labs today show no leukocytosis, no elevation in her serum creatinine, but she is noted to have + cdiff pcr (although the lab mentions that little to no toxin was appreciated). Given her symptoms she likely has recurrent infection, since her symptoms completely resolved about a month ago.   Given this is her fourth infection overall she should be treated with fidaxomicin and possible adjunctive Bezlotoxumab. She does not have severe disease given lack of leukocytosis and no worsening in renal function. Her abdominal exam is also absent of peritonitic signs.  -Given her age, poor PO intake, suspect she is at increased risk for  dehydration. Would admit patient until improvement in diarrhea.   Addendum: Reached out to ID who stated that if fidaxomicin worked during previous episode that the would recommend starting patient on this. Started patient on this medication.   From the chart it appears that patient was initially started on PO  vancomycin but because she had persistent diarrhea she was transitioned to PO fidaxomicin.   Risk Prescription drug management. Decision regarding hospitalization.    Final Clinical Impression(s) / ED Diagnoses Final diagnoses:  None    Rx / DC Orders ED Discharge Orders     None         Rick Duff, MD 01/11/22 1443    Rick Duff, MD 01/11/22 1638    Tegeler, Gwenyth Allegra, MD 01/12/22 [phone]

## 2022-01-11 NOTE — ED Triage Notes (Addendum)
Pt. Stated, I was dx with C-Diff and told to come here. Im having stomach pain and diarrhea, started last week. Pt having constant diarrhea.

## 2022-01-11 NOTE — ED Notes (Signed)
Pt reassessed, ambulating independently in the waiting room. No acute distress noted. Will continue to monitor.

## 2022-01-11 NOTE — ED Provider Triage Note (Signed)
Emergency Medicine Provider Triage Evaluation Note  Julie Montgomery , a 86 y.o. female  was evaluated in triage.  Pt complains of ongoing diarrhea.  Seen in the ED yesterday and called back today regarding positive C. difficile testing.  History of prior C. difficile infection.  Patient reports that she thinks she was on antibiotics due to the procedure to have some spots removed from her scalp within the last few months.  Reports ongoing continuous diarrhea.  Reports feeling weak and fatigued.  No fevers.  Associated generalized abdominal pain, no vomiting.  Review of Systems  Positive: Diarrhea, abdominal pain, fatigue Negative: Fever, vomiting  Physical Exam  BP (!) 141/80 (BP Location: Right Arm)   Pulse 79   Temp 98 F (36.7 C) (Oral)   Resp 17   Ht '5\' 2"'$  (1.575 m)   Wt 40.4 kg   SpO2 98%   BMI 16.28 kg/m  Gen:   Awake, no distress   Resp:  Normal effort  MSK:   Moves extremities without difficulty  Other:  Mild generalized abdominal tenderness  Medical Decision Making  Medically screening exam initiated at 10:18 AM.  Appropriate orders placed.  Julie Montgomery was informed that the remainder of the evaluation will be completed by another provider, this initial triage assessment does not replace that evaluation, and the importance of remaining in the ED until their evaluation is complete.  Reviewed C. difficile testing from yesterday.  Patient had positive C. difficile antigen, negative C. difficile toxin, on PCR reflex patient is positive for toxigenic C. difficile but with very little toxin production, although patient does have clinical symptoms concerning for C. difficile with ongoing diarrhea, has had to have a bowel movement multiple times during triage.  Also has generalized abdominal tenderness.  Will repeat lab work and get CT to assess degree of colitis   Jacqlyn Larsen, [place] 01/11/22 1021

## 2022-01-11 NOTE — H&P (Signed)
History and Physical    Julie Montgomery FTD:[id number] DOB: [birth year] DOA: 01/11/2022  PCP: Cassandria Anger, MD (Confirm with patient/family/NH records and if not entered, this has to be entered at [facility] point of entry) Patient coming from: Home  I have personally briefly reviewed patient's old medical records in [place]  Chief Complaint: Recurrent diarrhea  HPI: Julie Montgomery is a 86 y.o. female with medical history significant of C. difficile colitis recently, HTN, anxiety/depression, remote TIA, bronchiectasis, unintentional weight loss/severe protein calorie malnutrition, presented with recurrent diarrhea.  Patient was hospitalized last month for C. difficile infection for the first time this year, previously she had 2 isolated C. difficile infection in 2016 and 2018.  On that admission, was successfully treated with Dificid for 10 days and her diarrhea resolved.  However, about 3-4 days ago, patient started to have cramping like periumbilical abdominal pain and then started develop watery diarrhea initially with isolated episodes, but 2 days ago became worse, when she continues to have watery diarrhea almost every hour and her appetite had dropped none.  She took only 1 meal for the last 2 days and feeling nauseous.  Denies any fever or chills.  She went to [facility] ED 2 days ago, the plan initially was to rule out CAD however patient did not have any diarrhea during the ED stay and she was reassured and sent home.  But yesterday patient came back to ED again,  C diff study positive for both antigen and toxin.  ED Course: No tachycardia no hypotension afebrile.  CT abdomen pelvis showed diffused colonic inflammation.  WBC 8.5, creatinine 0.7, BUN 12.  Infectious disease consulted ENT, recommend start Dificid for 10 days  Review of Systems: As per HPI otherwise 14 point review of systems negative.    Past Medical History:  Diagnosis Date   Adenomatous polyp of colon 2020    Allergy    Anxiety    Basal cell carcinoma (BCC)    Cataract    Depression    Helicobacter pylori gastritis    Osteopenia    Pneumonia    hx of 03/2008   Stroke [facility])    TIA long time ago    Past Surgical History:  Procedure Laterality Date   APPENDECTOMY     BREAST LUMPECTOMY     right   CATARACT EXTRACTION W/ INTRAOCULAR LENS  IMPLANT, BILATERAL Bilateral    CESAREAN SECTION     CHOLECYSTECTOMY     MOHS SURGERY     PARTIAL HYSTERECTOMY     ROTATOR CUFF REPAIR  2012   SKIN GRAFT     teeth remmoved       reports that she has never smoked. She has never used smokeless tobacco. She reports that she does not drink alcohol and does not use drugs.  Allergies  Allergen Reactions   Lovastatin Other (See Comments)    Unknown reaction   Mometasone Furo-Formoterol Fum Other (See Comments)    Loss of appetite, laryngitis    Prozac [Fluoxetine Hcl] Other (See Comments)    Jumpy   Sulfa Antibiotics Other (See Comments)    Unknown reaction    Family History  Problem Relation Age of Onset   Diabetes Sister    Cancer Sister        lung ca   Coronary artery disease Brother    Dementia Brother    Cancer Brother        lung ca   Stomach cancer Paternal  Grandmother    Colon cancer Son    Breast cancer Neg Hx    Esophageal cancer Neg Hx    Rectal cancer Neg Hx      Prior to Admission medications   Medication Sig Start Date End Date Taking? Authorizing Provider  apixaban (ELIQUIS) 2.5 MG TABS tablet Take 1 tablet (2.5 mg total) by mouth 2 (two) times daily. 12/18/21   Caren Griffins, MD  furosemide (LASIX) 20 MG tablet Take 1 tablet (20 mg total) by mouth daily for 7 days. 01/05/22 01/12/22  Tolia, Sunit, DO  metoprolol succinate (TOPROL XL) 25 MG 24 hr tablet Take 1 tablet (25 mg total) by mouth every morning. Hold if systolic blood pressure (top number) less than 100 mmHg or pulse less than 60 bpm. 01/05/22 04/05/22  Tolia, Sunit, DO  mupirocin ointment (BACTROBAN) 2 %  Apply 1 Application topically 3 (three) times daily. 01/02/22   [provider]  Oxymetazoline HCl 0.025 % SOLN Place into the nose.    [provider]  Probiotic Product (ALIGN) 4 MG CAPS Take 1 capsule (4 mg total) by mouth daily. 12/26/21   Plotnikov, Evie Lacks, MD    Physical Exam: Vitals:   01/11/22 1327 01/11/22 1415 01/11/22 1445 01/11/22 1558  BP: 122/67 121/62 (!) 127/58 (!) 143/79  Pulse: 77 68  75  Resp: '17 18 19 18  '$ Temp: 98.4 F (36.9 C)     TempSrc: Oral     SpO2: 97% 100%  100%  Weight:      Height:        Constitutional: NAD, calm, comfortable Vitals:   01/11/22 1327 01/11/22 1415 01/11/22 1445 01/11/22 1558  BP: 122/67 121/62 (!) 127/58 (!) 143/79  Pulse: 77 68  75  Resp: '17 18 19 18  '$ Temp: 98.4 F (36.9 C)     TempSrc: Oral     SpO2: 97% 100%  100%  Weight:      Height:       Eyes: PERRL, lids and conjunctivae normal ENMT: Mucous membranes are dry. Posterior pharynx clear of any exudate or lesions.Normal dentition.  Neck: normal, supple, no masses, no thyromegaly Respiratory: clear to auscultation bilaterally, no wheezing, no crackles. Normal respiratory effort. No accessory muscle use.  Cardiovascular: Regular rate and rhythm, no murmurs / rubs / gallops. No extremity edema. 2+ pedal pulses. No carotid bruits.  Abdomen: mild tenderness on periumbilical area, no rebound or guarding, no masses palpated. No hepatosplenomegaly. Bowel sounds positive.  Musculoskeletal: no clubbing / cyanosis. No joint deformity upper and lower extremities. Good ROM, no contractures. Normal muscle tone.  Skin: no rashes, lesions, ulcers. No induration Neurologic: CN 2-12 grossly intact. Sensation intact, DTR normal. Strength 5/5 in all 4.  Psychiatric: Normal judgment and insight. Alert and oriented x 3. Normal mood.     Labs on Admission: I have personally reviewed following labs and imaging studies  CBC: Recent Labs  Lab 01/09/22 1726 01/10/22 1224  01/11/22 1023  WBC 9.9 5.8 8.5  NEUTROABS  --  4.7 6.9  HGB 12.6 11.5* 12.2  HCT 37.5 34.6* 36.4  MCV 94.0 94.0 95.5  PLT 319 241 034   Basic Metabolic Panel: Recent Labs  Lab 01/09/22 1726 01/10/22 1224 01/11/22 1023  NA 134* 134* 132*  K 4.0 4.2 3.6  CL 98 99 101  CO2 '25 27 24  '$ GLUCOSE 115* 105* 109*  BUN '21 21 12  '$ CREATININE 0.66 0.65 0.71  CALCIUM 10.0 9.4 8.8*  GFR: Estimated Creatinine Clearance: 34 mL/min (by C-G formula based on SCr of 0.71 mg/dL). Liver Function Tests: Recent Labs  Lab 01/09/22 1726 01/10/22 1224 01/11/22 1023  AST '21 18 26  '$ ALT '24 20 24  '$ ALKPHOS 126 106 105  BILITOT 0.6 0.5 0.4  PROT 7.2 6.3* 6.2*  ALBUMIN 4.2 3.7 3.0*   Recent Labs  Lab 01/09/22 1726 01/10/22 1224  LIPASE 24 27   No results for input(s): "AMMONIA" in the last 168 hours. Coagulation Profile: No results for input(s): "INR", "PROTIME" in the last 168 hours. Cardiac Enzymes: No results for input(s): "CKTOTAL", "CKMB", "CKMBINDEX", "TROPONINI" in the last 168 hours. BNP (last 3 results) No results for input(s): "PROBNP" in the last 8760 hours. HbA1C: No results for input(s): "HGBA1C" in the last 72 hours. CBG: Recent Labs  Lab 01/10/22 1249  GLUCAP 94   Lipid Profile: No results for input(s): "CHOL", "HDL", "LDLCALC", "TRIG", "CHOLHDL", "LDLDIRECT" in the last 72 hours. Thyroid Function Tests: No results for input(s): "TSH", "T4TOTAL", "FREET4", "T3FREE", "THYROIDAB" in the last 72 hours. Anemia Panel: No results for input(s): "VITAMINB12", "FOLATE", "FERRITIN", "TIBC", "IRON", "RETICCTPCT" in the last 72 hours. Urine analysis:    Component Value Date/Time   COLORURINE YELLOW 01/11/2022 [place] 01/11/2022 1039   LABSPEC 1.025 01/11/2022 1039   PHURINE 5.5 01/11/2022 1039   GLUCOSEU NEGATIVE 01/11/2022 1039   GLUCOSEU NEGATIVE 04/05/2021 1236   HGBUR SMALL (A) 01/11/2022 1039   BILIRUBINUR NEGATIVE 01/11/2022 1039   KETONESUR 15 (A)  01/11/2022 1039   PROTEINUR NEGATIVE 01/11/2022 1039   UROBILINOGEN 0.2 04/05/2021 1236   NITRITE NEGATIVE 01/11/2022 1039   LEUKOCYTESUR SMALL (A) 01/11/2022 1039    Radiological Exams on Admission: CT Abdomen Pelvis W Contrast  Result Date: 01/11/2022 CLINICAL DATA:  Abdominal pain, acute, nonlocalized. C diff, assess for degree of colitis. EXAM: CT ABDOMEN AND PELVIS WITH CONTRAST TECHNIQUE: Multidetector CT imaging of the abdomen and pelvis was performed using the standard protocol following bolus administration of intravenous contrast. RADIATION DOSE REDUCTION: This exam was performed according to the departmental dose-optimization program which includes automated exposure control, adjustment of the mA and/or kV according to patient size and/or use of iterative reconstruction technique. CONTRAST:  55m OMNIPAQUE IOHEXOL 350 MG/ML SOLN COMPARISON:  CT abdomen and pelvis 12/24/2021 FINDINGS: Lower chest: Small, partly clustered nodular densities located predominantly peripherally in both lung bases. Scattered mucous plugging in the airways. Resolved pleural effusions. Hepatobiliary: No focal liver abnormality is seen. Status post cholecystectomy. No biliary dilatation. Pancreas: Unremarkable. Spleen: Unremarkable. Adrenals/Urinary Tract: Unremarkable adrenal glands. 1.5 cm left renal cyst for which no follow-up imaging is recommended. No renal calculi or hydronephrosis. Unremarkable bladder. Stomach/Bowel: The stomach is unremarkable. There is no evidence of bowel obstruction. There is new wall thickening diffusely involving the colon which appears worst in the right colon. Gas and scattered small fluid levels are present throughout the colon, and there is surrounding inflammatory stranding diffusely about the colon and rectum. Vascular/Lymphatic: Abdominal aortic atherosclerosis. Unchanged 1.5 cm coarsely calcified splenic artery aneurysm. No enlarged lymph nodes. Reproductive: Status post  hysterectomy. No adnexal masses. Other: Small volume pelvic free fluid.  No pneumoperitoneum. Musculoskeletal: No acute osseous abnormality or suspicious osseous lesion. Advanced lower lumbar facet arthrosis. IMPRESSION: 1. New diffuse colonic inflammation consistent with the provided history of C difficile colitis. 2. Small volume pelvic free fluid. 3. Small peripheral nodular densities in both lung bases, likely infectious/inflammatory. 4. Aortic Atherosclerosis (ICD10-I70.0). Electronically Signed  By: Logan Bores M.D.   On: 01/11/2022 15:51    EKG: INone  Assessment/Plan Principal Problem:   C. difficile colitis Active Problems:   Clostridioides difficile infection  (please populate well all problems here in Problem List. (For example, if patient is on BP meds at home and you resume or decide to hold them, it is a problem that needs to be her. Same for CAD, COPD, HLD and so on)  Recurrent C. difficile colitis -Her first C. difficile colitis episode 3 weeks ago-after she took Keflex for a scalp infection, which was supposedly treated with 10 days course of Dificid. -ATLAS=3, applying a cure rate 89% and moderate with 3.6%.  Infection disease Dr. Recommended restart Dificid of 10 days course.  Discussed with patient and her husband at bedside, who agreed with the plan. -Continue probiotics.  Severe dehydration -Secondary to poor oral intake from recurrent C. difficile infection. -IV fluid  Hyponatremia -Volume contracted, secondary to dehydration, on IV fluids, repeat BMP tomorrow.  PAF -On last admission on her first C. difficile colitis, she was diagnosed with PAF.  She does have increased CHADS2 score, and it appears that the on-call cardiology at that point recommend patient follow-up with cardiology outpatient to discuss long-term anticoagulation and long-term cardiac monitor such as loop recorder. Given that, patient developed A-fib while she had severe diarrhea last admission and  today she is seen sinus rhythm and CT scan showed severe inflammation of the whole colon with concern about bleeding, will hold off Eliquis, switch to DVT prophylaxis dose of Lovenox for now.  HTN -Severely dehydrated and volume contracted, hold off home BP meds -As needed hydralazine for now  Severe protein calorie malnutrition -BMI= 16, has been following with PCP, used to be on megestrol. -Start protein supplement once tolerated p.o.  DVT prophylaxis: Lovenox Code Status: Full code Family Communication: Husband at bedside Disposition Plan: Patient sick with recurrent C. difficile colitis severe dehydration, requiring inpatient treatment and IV fluids, expect more than 2 midnight hospital stay Consults called: Infectious disease Admission status: MedSurg admission   Lequita Halt MD [facility] Pager [phone]  01/11/2022, 5:28 PM

## 2022-01-11 NOTE — ED Notes (Signed)
Patient transported to CT 

## 2022-01-11 NOTE — ED Notes (Signed)
RN notified by CT that 20g LAC IV infiltrated during scanning. Infiltrated IV was removed prior to return to room and new line established and scan was completed.

## 2022-01-11 NOTE — Telephone Encounter (Signed)
From pt

## 2022-01-12 ENCOUNTER — Other Ambulatory Visit (HOSPITAL_COMMUNITY): Payer: Self-pay

## 2022-01-12 ENCOUNTER — Telehealth: Payer: Self-pay

## 2022-01-12 ENCOUNTER — Ambulatory Visit: Payer: Federal, State, Local not specified - PPO | Admitting: Cardiology

## 2022-01-12 ENCOUNTER — Encounter (HOSPITAL_COMMUNITY): Payer: Self-pay | Admitting: Internal Medicine

## 2022-01-12 ENCOUNTER — Telehealth (HOSPITAL_COMMUNITY): Payer: Self-pay

## 2022-01-12 DIAGNOSIS — A0472 Enterocolitis due to Clostridium difficile, not specified as recurrent: Secondary | ICD-10-CM | POA: Diagnosis not present

## 2022-01-12 DIAGNOSIS — A498 Other bacterial infections of unspecified site: Secondary | ICD-10-CM

## 2022-01-12 DIAGNOSIS — E871 Hypo-osmolality and hyponatremia: Secondary | ICD-10-CM | POA: Diagnosis not present

## 2022-01-12 DIAGNOSIS — E872 Acidosis, unspecified: Secondary | ICD-10-CM | POA: Diagnosis not present

## 2022-01-12 DIAGNOSIS — I480 Paroxysmal atrial fibrillation: Secondary | ICD-10-CM | POA: Diagnosis not present

## 2022-01-12 DIAGNOSIS — E43 Unspecified severe protein-calorie malnutrition: Secondary | ICD-10-CM

## 2022-01-12 LAB — CBC
HCT: 31.8 % — ABNORMAL LOW (ref 36.0–46.0)
Hemoglobin: 11 g/dL — ABNORMAL LOW (ref 12.0–15.0)
MCH: 31.9 pg (ref 26.0–34.0)
MCHC: 34.6 g/dL (ref 30.0–36.0)
MCV: 92.2 fL (ref 80.0–100.0)
Platelets: 217 10*3/uL (ref 150–400)
RBC: 3.45 MIL/uL — ABNORMAL LOW (ref 3.87–5.11)
RDW: 13 % (ref 11.5–15.5)
WBC: 6.2 10*3/uL (ref 4.0–10.5)
nRBC: 0 % (ref 0.0–0.2)

## 2022-01-12 LAB — BASIC METABOLIC PANEL
Anion gap: 9 (ref 5–15)
BUN: 5 mg/dL — ABNORMAL LOW (ref 8–23)
CO2: 19 mmol/L — ABNORMAL LOW (ref 22–32)
Calcium: 8.3 mg/dL — ABNORMAL LOW (ref 8.9–10.3)
Chloride: 102 mmol/L (ref 98–111)
Creatinine, Ser: 0.51 mg/dL (ref 0.44–1.00)
GFR, Estimated: >60 mL/min (ref >60)
Glucose, Bld: 101 mg/dL — ABNORMAL HIGH (ref 70–99)
Potassium: 3.8 mmol/L (ref 3.5–5.1)
Sodium: 130 mmol/L — ABNORMAL LOW (ref 135–145)

## 2022-01-12 MED ORDER — METOPROLOL TARTRATE 5 MG/5ML IV SOLN: 2.5000 mg | INTRAVENOUS | Status: DC | PRN

## 2022-01-12 MED ORDER — RISAQUAD PO CAPS
1.0000 | ORAL_CAPSULE | Freq: Every day | ORAL | Status: DC
  Administered 2022-01-12 – 2022-01-13 (×2): 1 via ORAL
  Filled 2022-01-12 (×2): qty 1

## 2022-01-12 NOTE — Telephone Encounter (Signed)
Pharmacy Patient Advocate Encounter  Insurance verification completed.    The patient is insured through [employer]   The patient is currently admitted and ran test claims for the following: Dificid.  Copays and coinsurance results were relayed to Inpatient clinical team.

## 2022-01-12 NOTE — Telephone Encounter (Signed)
Tried calling patient left voice message for patient to return call

## 2022-01-12 NOTE — Progress Notes (Signed)
PROGRESS NOTE  Julie Montgomery JKK:[id number] DOB: Dec 25, 1935   PCP: Cassandria Anger, MD  Patient is from: Home.  Lives with husband.  DOA: 01/11/2022 LOS: 1  Chief complaints Chief Complaint  Patient presents with   Abdominal Pain   Diarrhea   C-Diff     Brief Narrative / Interim history: 86 year old F with PMH of C. difficile colitis (2016, 2018 and 11/2021), paroxysmal A-fib not on AC, HTN, anxiety, depression, bronchiectasis and severe malnutrition with unintentional weight loss presenting with diarrhea and abdominal pain for 3 to 4 days and admitted for recurrent C. difficile colitis.  Recently hospitalized from 8/15-8/21 for C. difficile colitis presumed to be provoked by Keflex she had for scalp infection, and treated with 10 days of deficit.   On admission, hemodynamically stable.  Afebrile.  No leukocytosis.  C. difficile testing positive for antigen but negative for toxin.  PCR positive.  CT abdomen and pelvis showed diffuse colonic inflammation consistent with C. difficile infection.  She has no leukocytosis.  She was started on Dificid for 10 days "per ID recommendation".     Subjective: Seen and examined earlier this morning.  No major events overnight of this morning.  Reports improvement in diarrhea and abdominal pain.  Still tender.  Denies nausea or vomiting.  Objective: Vitals:   01/12/22 0814 01/12/22 0900 01/12/22 1000 01/12/22 1118  BP:  (!) 113/97 (!) 123/59 (!) 121/55  Pulse:  79 71 67  Resp:  (!) 25 (!) 21 20  Temp: 98.4 F (36.9 C)   98 F (36.7 C)  TempSrc: Oral   Oral  SpO2:  97% 98% 98%  Weight:      Height:        Examination:  GENERAL: No apparent distress.  Nontoxic. HEENT: MMM.  Vision and hearing grossly intact.  NECK: Supple.  No apparent JVD.  RESP:  No IWOB.  Fair aeration bilaterally. CVS:  RRR. Heart sounds normal.  ABD/GI/GU: BS+. Abd soft.  Diffuse abdominal tenderness. MSK/EXT:  Moves extremities.  Trace BLE edema.   Significant muscle mass and subcu fat loss. SKIN: no apparent skin lesion or wound NEURO: Awake, alert and oriented appropriately.  No apparent focal neuro deficit. PSYCH: Calm. Normal affect.   Procedures:  None  Microbiology summarized: C. difficile PCR positive.  Assessment and plan: Principal Problem:   C. difficile colitis Active Problems:   Protein-calorie malnutrition, severe ([place])   Bronchiectasis (HCC)   Paroxysmal A-fib (HCC)   Clostridioides difficile infection   Hyponatremia   Metabolic acidosis  Recurrent C. difficile colitis-seems like fourth episode (2016, 2018 and last month).  C. difficile PCR positive.  CT with new diffuse colonic inflammation consistent with C. difficile. -Continue Dificid for 10 days per ID recommendation -Continue IV fluid and rectal tube.   Hyponatremia/metabolic acidosis/severe dehydration: Likely due to C. difficile colitis. -Management as above   Paroxysmal A-fib: New diagnosis at her last hospitalization last months.  Presumed to be provoked in the setting of C. difficile infection at that time.  She was discharged on metoprolol and low-dose Eliquis -Hold home medications in the setting of active C. difficile diarrhea  Essential hypertension: Normotensive. -Hold home metoprolol. -IV metoprolol as needed  Severe protein calorie malnutrition: Probably due to C. difficile Body mass index is 16.28 kg/m. -Consult RD          DVT prophylaxis:  enoxaparin (LOVENOX) injection 30 mg Start: 01/11/22 2000  Code Status: Full code.  Confirmed. Family Communication: Updated patient's  husband at bedside Level of care: Med-Surg Status is: Inpatient Remains inpatient appropriate because: Due to C. difficile colitis with diarrhea and dehydration   Final disposition: Likely home Consultants:  Infectious disease over the phone by admitting provider  Sch Meds:  Scheduled Meds:  Align  1 capsule Oral Daily   enoxaparin (LOVENOX)  injection  30 mg Subcutaneous Q24H   fidaxomicin  200 mg Oral BID   mupirocin ointment  1 Application Topical TID   Continuous Infusions:  sodium chloride 125 mL/hr at 01/12/22 1242   PRN Meds:.acetaminophen **OR** acetaminophen, metoprolol tartrate, ondansetron (ZOFRAN) IV  Antimicrobials: Anti-infectives (From admission, onward)    Start     Dose/Rate Route Frequency Ordered Stop   01/11/22 2200  fidaxomicin (DIFICID) tablet 200 mg        200 mg Oral 2 times daily 01/11/22 1637 01/21/22 2159        I have personally reviewed the following labs and images: CBC: Recent Labs  Lab 01/09/22 1726 01/10/22 1224 01/11/22 1023 01/12/22 0304  WBC 9.9 5.8 8.5 6.2  NEUTROABS  --  4.7 6.9  --   HGB 12.6 11.5* 12.2 11.0*  HCT 37.5 34.6* 36.4 31.8*  MCV 94.0 94.0 95.5 92.2  PLT 319 241 263 217   BMP &GFR Recent Labs  Lab 01/09/22 1726 01/10/22 1224 01/11/22 1023 01/12/22 0304  NA 134* 134* 132* 130*  K 4.0 4.2 3.6 3.8  CL 98 99 101 102  CO2 '25 27 24 '$ 19*  GLUCOSE 115* 105* 109* 101*  BUN '21 21 12 '$ 5*  CREATININE 0.66 0.65 0.71 0.51  CALCIUM 10.0 9.4 8.8* 8.3*   Estimated Creatinine Clearance: 34 mL/min (by C-G formula based on SCr of 0.51 mg/dL). Liver & Pancreas: Recent Labs  Lab 01/09/22 1726 01/10/22 1224 01/11/22 1023  AST '21 18 26  '$ ALT '24 20 24  '$ ALKPHOS 126 106 105  BILITOT 0.6 0.5 0.4  PROT 7.2 6.3* 6.2*  ALBUMIN 4.2 3.7 3.0*   Recent Labs  Lab 01/09/22 1726 01/10/22 1224  LIPASE 24 27   No results for input(s): "AMMONIA" in the last 168 hours. Diabetic: No results for input(s): "HGBA1C" in the last 72 hours. Recent Labs  Lab 01/10/22 1249  GLUCAP 94   Cardiac Enzymes: No results for input(s): "CKTOTAL", "CKMB", "CKMBINDEX", "TROPONINI" in the last 168 hours. No results for input(s): "PROBNP" in the last 8760 hours. Coagulation Profile: No results for input(s): "INR", "PROTIME" in the last 168 hours. Thyroid Function Tests: No results for  input(s): "TSH", "T4TOTAL", "FREET4", "T3FREE", "THYROIDAB" in the last 72 hours. Lipid Profile: No results for input(s): "CHOL", "HDL", "LDLCALC", "TRIG", "CHOLHDL", "LDLDIRECT" in the last 72 hours. Anemia Panel: No results for input(s): "VITAMINB12", "FOLATE", "FERRITIN", "TIBC", "IRON", "RETICCTPCT" in the last 72 hours. Urine analysis:    Component Value Date/Time   COLORURINE YELLOW 01/11/2022 [place] 01/11/2022 1039   LABSPEC 1.025 01/11/2022 1039   PHURINE 5.5 01/11/2022 1039   GLUCOSEU NEGATIVE 01/11/2022 1039   GLUCOSEU NEGATIVE 04/05/2021 1236   HGBUR SMALL (A) 01/11/2022 1039   BILIRUBINUR NEGATIVE 01/11/2022 1039   KETONESUR 15 (A) 01/11/2022 1039   PROTEINUR NEGATIVE 01/11/2022 1039   UROBILINOGEN 0.2 04/05/2021 1236   NITRITE NEGATIVE 01/11/2022 1039   LEUKOCYTESUR SMALL (A) 01/11/2022 1039   Sepsis Labs: Invalid input(s): "PROCALCITONIN", "LACTICIDVEN"  Microbiology: Recent Results (from the past 240 hour(s))  C Difficile Quick Screen w PCR reflex     Status: Abnormal  Collection Time: 01/10/22  3:38 PM   Specimen: STOOL  Result Value Ref Range Status   C Diff antigen POSITIVE (A) NEGATIVE Final   C Diff toxin NEGATIVE NEGATIVE Final   C Diff interpretation Results are indeterminate. See PCR results.  Final    Comment: Performed at [facility], [place] [address], [place], [place] [address]  C. Diff by PCR, Reflexed     Status: Abnormal   Collection Time: 01/10/22  3:38 PM  Result Value Ref Range Status   Toxigenic C. Difficile by PCR POSITIVE (A) NEGATIVE Final    Comment: Positive for toxigenic C. difficile with little to no toxin production. Only treat if clinical presentation suggests symptomatic illness. Performed at [facility], [place] [address], [place], [place] [address]     Radiology Studies: CT Abdomen Pelvis W Contrast  Result Date: 01/11/2022 CLINICAL DATA:  Abdominal pain, acute, nonlocalized. C diff, assess for  degree of colitis. EXAM: CT ABDOMEN AND PELVIS WITH CONTRAST TECHNIQUE: Multidetector CT imaging of the abdomen and pelvis was performed using the standard protocol following bolus administration of intravenous contrast. RADIATION DOSE REDUCTION: This exam was performed according to the departmental dose-optimization program which includes automated exposure control, adjustment of the mA and/or kV according to patient size and/or use of iterative reconstruction technique. CONTRAST:  54m OMNIPAQUE IOHEXOL 350 MG/ML SOLN COMPARISON:  CT abdomen and pelvis 12/24/2021 FINDINGS: Lower chest: Small, partly clustered nodular densities located predominantly peripherally in both lung bases. Scattered mucous plugging in the airways. Resolved pleural effusions. Hepatobiliary: No focal liver abnormality is seen. Status post cholecystectomy. No biliary dilatation. Pancreas: Unremarkable. Spleen: Unremarkable. Adrenals/Urinary Tract: Unremarkable adrenal glands. 1.5 cm left renal cyst for which no follow-up imaging is recommended. No renal calculi or hydronephrosis. Unremarkable bladder. Stomach/Bowel: The stomach is unremarkable. There is no evidence of bowel obstruction. There is new wall thickening diffusely involving the colon which appears worst in the right colon. Gas and scattered small fluid levels are present throughout the colon, and there is surrounding inflammatory stranding diffusely about the colon and rectum. Vascular/Lymphatic: Abdominal aortic atherosclerosis. Unchanged 1.5 cm coarsely calcified splenic artery aneurysm. No enlarged lymph nodes. Reproductive: Status post hysterectomy. No adnexal masses. Other: Small volume pelvic free fluid.  No pneumoperitoneum. Musculoskeletal: No acute osseous abnormality or suspicious osseous lesion. Advanced lower lumbar facet arthrosis. IMPRESSION: 1. New diffuse colonic inflammation consistent with the provided history of C difficile colitis. 2. Small volume pelvic free  fluid. 3. Small peripheral nodular densities in both lung bases, likely infectious/inflammatory. 4. Aortic Atherosclerosis (ICD10-I70.0). Electronically Signed   By: ALogan BoresM.D.   On: 01/11/2022 15:51      Lynnette Pote T. GLake Shore If 7PM-7AM, please contact night-coverage [email or web address] 01/12/2022, 12:52 PM

## 2022-01-12 NOTE — ED Notes (Signed)
[facility] in [place] [phone] call with any additional orders needed for home health.

## 2022-01-12 NOTE — ED Notes (Signed)
Pt requesting linen change after episode of stool incontinence, states she "couldn't get up in time." Pt cleaned, provided new linens, BSC emptied.

## 2022-01-12 NOTE — ED Notes (Addendum)
Pt expressing continued abd pain/discomfort; states "I can't go on like this." Admitting MD paged for PRN pain medication/ additional orders per request. No new orders at this time.

## 2022-01-12 NOTE — TOC Benefit Eligibility Note (Signed)
Patient Research scientist (life sciences) completed.     The patient is currently admitted and upon discharge could be taking Dificid.   The current 30 day co-pay is, $50.01.   The patient is insured through [employer].

## 2022-01-12 NOTE — Telephone Encounter (Signed)
        Patient  visited [facility] on 9/13     Telephone encounter attempt :  2ND  UNABLE TO [place], [facility], Care Management  [phone] [address], [place], [place] [address] Phone: [phone] Email: [email or web address]

## 2022-01-13 ENCOUNTER — Other Ambulatory Visit (HOSPITAL_COMMUNITY): Payer: Self-pay

## 2022-01-13 DIAGNOSIS — E871 Hypo-osmolality and hyponatremia: Secondary | ICD-10-CM | POA: Diagnosis not present

## 2022-01-13 DIAGNOSIS — J479 Bronchiectasis, uncomplicated: Secondary | ICD-10-CM

## 2022-01-13 DIAGNOSIS — A0472 Enterocolitis due to Clostridium difficile, not specified as recurrent: Secondary | ICD-10-CM | POA: Diagnosis not present

## 2022-01-13 DIAGNOSIS — I480 Paroxysmal atrial fibrillation: Secondary | ICD-10-CM | POA: Diagnosis not present

## 2022-01-13 DIAGNOSIS — E872 Acidosis, unspecified: Secondary | ICD-10-CM | POA: Diagnosis not present

## 2022-01-13 LAB — RENAL FUNCTION PANEL
Albumin: 2.2 g/dL — ABNORMAL LOW (ref 3.5–5.0)
Anion gap: 11 (ref 5–15)
BUN: <5 mg/dL — ABNORMAL LOW (ref 8–23)
CO2: 21 mmol/L — ABNORMAL LOW (ref 22–32)
Calcium: 8.9 mg/dL (ref 8.9–10.3)
Chloride: 98 mmol/L (ref 98–111)
Creatinine, Ser: 0.53 mg/dL (ref 0.44–1.00)
GFR, Estimated: >60 mL/min (ref >60)
Glucose, Bld: 107 mg/dL — ABNORMAL HIGH (ref 70–99)
Phosphorus: 2.4 mg/dL — ABNORMAL LOW (ref 2.5–4.6)
Potassium: 3.4 mmol/L — ABNORMAL LOW (ref 3.5–5.1)
Sodium: 130 mmol/L — ABNORMAL LOW (ref 135–145)

## 2022-01-13 LAB — CBC
HCT: 36.8 % (ref 36.0–46.0)
Hemoglobin: 12.4 g/dL (ref 12.0–15.0)
MCH: 31.1 pg (ref 26.0–34.0)
MCHC: 33.7 g/dL (ref 30.0–36.0)
MCV: 92.2 fL (ref 80.0–100.0)
Platelets: 224 10*3/uL (ref 150–400)
RBC: 3.99 MIL/uL (ref 3.87–5.11)
RDW: 13.2 % (ref 11.5–15.5)
WBC: 5.7 10*3/uL (ref 4.0–10.5)
nRBC: 0 % (ref 0.0–0.2)

## 2022-01-13 LAB — MAGNESIUM: Magnesium: 1.5 mg/dL — ABNORMAL LOW (ref 1.7–2.4)

## 2022-01-13 MED ORDER — FIDAXOMICIN 200 MG PO TABS
200.0000 mg | ORAL_TABLET | Freq: Two times a day (BID) | ORAL | 0 refills | Status: DC
  Filled 2022-01-13: qty 12, 6d supply, fill #0
  Filled 2022-01-13: qty 8, 4d supply, fill #0

## 2022-01-13 MED ORDER — MAGNESIUM SULFATE 2 GM/50ML IV SOLN
2.0000 g | Freq: Once | INTRAVENOUS | Status: AC
  Administered 2022-01-13: 2 g via INTRAVENOUS
  Filled 2022-01-13: qty 50

## 2022-01-13 MED ORDER — POTASSIUM CHLORIDE CRYS ER 20 MEQ PO TBCR
40.0000 meq | EXTENDED_RELEASE_TABLET | ORAL | Status: DC
  Administered 2022-01-13: 40 meq via ORAL
  Filled 2022-01-13 (×2): qty 2

## 2022-01-13 NOTE — Discharge Summary (Signed)
Physician Discharge Summary  Julie Montgomery NLG:[id number] DOB: [birth year] DOA: 01/11/2022  PCP: Cassandria Anger, MD  Admit date: 01/11/2022 Discharge date: 01/13/2022 Admitted From: Home Disposition: Home Recommendations for Outpatient Follow-up:  Follow up with PCP in 1 to 2 weeks Check BMP and CBC at follow-up Please follow up on the following pending results: None  Home Health: Not indicated Equipment/Devices: Not indicated  Discharge Condition: Stable CODE STATUS: Full code  Follow-up Information     Plotnikov, Evie Lacks, MD. Schedule an appointment as soon as possible for a visit in 1 week(s).   Specialty: Internal Medicine Contact information: [place] [address] [facility] course 86 year old F with PMH of C. difficile colitis (2016, 2018 and 11/2021), paroxysmal A-fib not on AC, HTN, anxiety, depression, bronchiectasis and severe malnutrition with unintentional weight loss presenting with diarrhea and abdominal pain for 3 to 4 days and admitted for recurrent C. difficile colitis.  Recently hospitalized from 8/15-8/21 for C. difficile colitis presumed to be provoked by Keflex she had for scalp infection, and treated with 10 days of deficit.    On admission, hemodynamically stable.  Afebrile.  No leukocytosis.  C. difficile testing positive for antigen but negative for toxin.  PCR positive.  CT abdomen and pelvis showed diffuse colonic inflammation consistent with C. difficile infection.  She had no leukocytosis.  She was started on Dificid for 10 days per discussion between the admitting provider and infectious disease.  On the day of discharge, diarrhea improved tremendously.  Patient tolerated solid food and felt well to go home and finish antibiotic course at home.  See individual problem list below for more.   Problems addressed during this hospitalization Principal Problem:   C. difficile colitis Active  Problems:   Protein-calorie malnutrition, severe ([place])   Bronchiectasis ([place])   Paroxysmal A-fib ([place])   Clostridioides difficile infection   Hyponatremia   Metabolic acidosis   Recurrent C. difficile colitis-seems like fourth episode (2016, 2018 and last month).  C. difficile PCR positive.  CT with new diffuse colonic inflammation consistent with C. difficile.  Diarrhea and pain improved significantly. -Discharged on Dificid for 8 more days   Hyponatremia/metabolic acidosis/severe dehydration: Likely due to C. difficile colitis.  Resolved. -Recheck BMP at follow-up   Paroxysmal A-fib: New diagnosis at her last hospitalization last months. -Continue home metoprolol and Eliquis.   Essential hypertension: Normotensive. -Continue home metoprolol. -Discontinue Lasix given risk for dehydration  Hypokalemia/hypophosphatemia/hypomagnesemia: Replenished prior to discharge.   Severe protein calorie malnutrition: As evidenced by low BMI and significant muscle mass and subcu fat loss. -Body mass index is 16.28 kg/m.              Vital signs Vitals:   01/12/22 1659 01/12/22 2206 01/13/22 0446 01/13/22 0738  BP: (!) 148/80 (!) 144/81 119/88 113/87  Pulse: 71 71 97 (!) 58  Temp: 98.6 F (37 C) 98.1 F (36.7 C) 97.6 F (36.4 C) 97.7 F (36.5 C)  Resp: 17 20 (!) 22 17  Height:      Weight:      SpO2: 98% 96% 99% 99%  TempSrc: Oral Oral Oral Axillary  BMI (Calculated):         Discharge exam  GENERAL: Appears frail.  No apparent distress.  Nontoxic. HEENT: MMM.  Vision and hearing grossly intact.  NECK: Supple.  No apparent JVD.  RESP:  No IWOB.  Fair aeration bilaterally. CVS:  RRR. Heart sounds normal.  ABD/GI/GU: BS+. Abd soft, NTND.  MSK/EXT:  Moves extremities.  Trace BLE edema. SKIN: no apparent skin lesion or wound NEURO: Awake and alert. Oriented appropriately.  No apparent focal neuro deficit. PSYCH: Calm. Normal affect.  Discharge Instructions Discharge  Instructions     Call MD for:  extreme fatigue   Complete by: As directed    Call MD for:  persistant dizziness or light-headedness   Complete by: As directed    Call MD for:  persistant nausea and vomiting   Complete by: As directed    Call MD for:  severe uncontrolled pain   Complete by: As directed    Call MD for:  temperature >100.4   Complete by: As directed    Diet general   Complete by: As directed    Discharge instructions   Complete by: As directed    It has been a pleasure taking care of you!  You were hospitalized with diarrhea, abdominal pain and dizziness likely due to C. difficile infection.  Your symptoms improved with treatment of C. difficile and IV fluid hydration.  We are discharging you on more antibiotics (fidaxomicin) for 8 more days to complete treatment course.  Maintain good hydration.  Please review your new medication list and the directions on the medications before you take them.  Follow-up with your primary care doctor in 1 to 2 weeks or sooner if needed.  C. difficile is contagious.  We strongly recommend handwashing with soap and warm water   Take care,   Increase activity slowly   Complete by: As directed       Allergies as of 01/13/2022       Reactions   Lovastatin Other (See Comments)   Unknown reaction   Mometasone Furo-formoterol Fum Other (See Comments)   Loss of appetite, laryngitis    Prozac [fluoxetine Hcl] Other (See Comments)   Jumpy   Sulfa Antibiotics Other (See Comments)   Unknown reaction        Medication List     STOP taking these medications    furosemide 20 MG tablet Commonly known as: LASIX       TAKE these medications    Align 4 MG Caps Take 1 capsule (4 mg total) by mouth daily.   Dificid 200 MG Tabs tablet Generic drug: fidaxomicin Take 1 tablet (200 mg total) by mouth 2 (two) times daily. Take for 8 days starting this evening and discard the rest   Eliquis 2.5 MG Tabs tablet Generic drug:  apixaban Take 1 tablet (2.5 mg total) by mouth 2 (two) times daily.   metoprolol succinate 25 MG 24 hr tablet Commonly known as: Toprol XL Take 1 tablet (25 mg total) by mouth every morning. Hold if systolic blood pressure (top number) less than 100 mmHg or pulse less than 60 bpm. What changed: when to take this   mupirocin ointment 2 % Commonly known as: BACTROBAN Apply 1 Application topically daily.        Consultations: Infectious disease over the phone  Procedures/Studies: None   CT Abdomen Pelvis W Contrast  Result Date: 01/11/2022 CLINICAL DATA:  Abdominal pain, acute, nonlocalized. C diff, assess for degree of colitis. EXAM: CT ABDOMEN AND PELVIS WITH CONTRAST TECHNIQUE: Multidetector CT imaging of the abdomen and pelvis was performed using the standard protocol following bolus administration of intravenous contrast. RADIATION DOSE REDUCTION: This exam was performed according to the departmental dose-optimization  program which includes automated exposure control, adjustment of the mA and/or kV according to patient size and/or use of iterative reconstruction technique. CONTRAST:  1m OMNIPAQUE IOHEXOL 350 MG/ML SOLN COMPARISON:  CT abdomen and pelvis 12/24/2021 FINDINGS: Lower chest: Small, partly clustered nodular densities located predominantly peripherally in both lung bases. Scattered mucous plugging in the airways. Resolved pleural effusions. Hepatobiliary: No focal liver abnormality is seen. Status post cholecystectomy. No biliary dilatation. Pancreas: Unremarkable. Spleen: Unremarkable. Adrenals/Urinary Tract: Unremarkable adrenal glands. 1.5 cm left renal cyst for which no follow-up imaging is recommended. No renal calculi or hydronephrosis. Unremarkable bladder. Stomach/Bowel: The stomach is unremarkable. There is no evidence of bowel obstruction. There is new wall thickening diffusely involving the colon which appears worst in the right colon. Gas and scattered small fluid  levels are present throughout the colon, and there is surrounding inflammatory stranding diffusely about the colon and rectum. Vascular/Lymphatic: Abdominal aortic atherosclerosis. Unchanged 1.5 cm coarsely calcified splenic artery aneurysm. No enlarged lymph nodes. Reproductive: Status post hysterectomy. No adnexal masses. Other: Small volume pelvic free fluid.  No pneumoperitoneum. Musculoskeletal: No acute osseous abnormality or suspicious osseous lesion. Advanced lower lumbar facet arthrosis. IMPRESSION: 1. New diffuse colonic inflammation consistent with the provided history of C difficile colitis. 2. Small volume pelvic free fluid. 3. Small peripheral nodular densities in both lung bases, likely infectious/inflammatory. 4. Aortic Atherosclerosis (ICD10-I70.0). Electronically Signed   By: ALogan BoresM.D.   On: 01/11/2022 15:51   CT Angio Chest PE W and/or Wo Contrast  Result Date: 12/24/2021 CLINICAL DATA:  86year old female with chest pain, shortness of breath and abdominal pain. Recently diagnosed with C difficile. EXAM: CT ANGIOGRAPHY CHEST CT ABDOMEN AND PELVIS WITH CONTRAST TECHNIQUE: Multidetector CT imaging of the chest was performed using the standard protocol during bolus administration of intravenous contrast. Multiplanar CT image reconstructions and MIPs were obtained to evaluate the vascular anatomy. Multidetector CT imaging of the abdomen and pelvis was performed using the standard protocol during bolus administration of intravenous contrast. RADIATION DOSE REDUCTION: This exam was performed according to the departmental dose-optimization program which includes automated exposure control, adjustment of the mA and/or kV according to patient size and/or use of iterative reconstruction technique. CONTRAST:  677mOMNIPAQUE IOHEXOL 350 MG/ML SOLN COMPARISON:  CT Abdomen and Pelvis 12/12/2021.  Chest CT 12/15/2010. FINDINGS: CTA CHEST FINDINGS Cardiovascular: Excellent contrast bolus timing in the  pulmonary arterial tree. No focal filling defect identified in the pulmonary arteries to suggest acute pulmonary embolism. No contrast in the aorta. Calcified aortic atherosclerosis. Cardiac size remains within normal limits. No pericardial effusion. Mediastinum/Nodes: No mediastinal mass or lymphadenopathy. Lungs/Pleura: Small bilateral layering pleural effusions. Major airways are patent. Chronic calcified granuloma in the right lower lung is stable since 2012 on series 6, image 83. Upper lobe perihilar and peribronchial confluent ground-glass opacity is mildly asymmetric (series 6, image 36). More confluent right apical opacity is in part related to scarring which was present in 2012. Lung bases are largely clear. Mild compressive atelectasis. No pulmonary consolidation at this time. Musculoskeletal: No acute or suspicious osseous lesion identified in the chest. Review of the MIP images confirms the above findings. CT ABDOMEN and PELVIS FINDINGS Hepatobiliary: Absent gallbladder.  Stable and negative liver. Pancreas: Negative. Spleen: Stable. Coarsely calcified splenic hilar aneurysm is unchanged. No perisplenic fluid. Adrenals/Urinary Tract: Stable and negative. Incidental pelvic phleboliths. Stomach/Bowel: Regression of rectosigmoid colon inflammation since 12/12/2021. Redundant large bowel with retained stool throughout. But no discrete large bowel inflammation is identified  now. The cecum is located in the pelvis. Distal small bowel is nondilated. Appendix not clearly identified but no evidence of acute appendicitis. No dilated small bowel in the abdomen. Stomach is decompressed. No free air or free fluid identified. Vascular/Lymphatic: Calcified aortic atherosclerosis. Normal caliber abdominal aorta. Major arterial structures in the abdomen and pelvis remain patent. Mild for age iliofemoral atherosclerosis. Portal venous system is patent. No lymphadenopathy identified. Reproductive: Surgically absent uterus.  Diminutive or absent ovaries. Other: No pelvic free fluid. Musculoskeletal: Stable.  No acute osseous abnormality identified. Review of the MIP images confirms the above findings. IMPRESSION: 1. Negative for acute pulmonary embolism. 2. Small layering pleural effusions with nonspecific asymmetric bilateral upper lobe and perihilar opacity. Differential considerations include pulmonary edema, viral/atypical respiratory pneumonia. 3. Regressed rectosigmoid colon inflammation since 12/12/2021, and no discrete large bowel inflammation now. Retained stool throughout redundant colon. No evidence of bowel obstruction. 4. No other acute or inflammatory process identified in the chest, abdomen, or pelvis. 5. Aortic Atherosclerosis (ICD10-I70.0). Electronically Signed   By: Genevie Ann M.D.   On: 12/24/2021 05:59   CT ABDOMEN PELVIS W CONTRAST  Result Date: 12/24/2021 CLINICAL DATA:  86 year old female with chest pain, shortness of breath and abdominal pain. Recently diagnosed with C difficile. EXAM: CT ANGIOGRAPHY CHEST CT ABDOMEN AND PELVIS WITH CONTRAST TECHNIQUE: Multidetector CT imaging of the chest was performed using the standard protocol during bolus administration of intravenous contrast. Multiplanar CT image reconstructions and MIPs were obtained to evaluate the vascular anatomy. Multidetector CT imaging of the abdomen and pelvis was performed using the standard protocol during bolus administration of intravenous contrast. RADIATION DOSE REDUCTION: This exam was performed according to the departmental dose-optimization program which includes automated exposure control, adjustment of the mA and/or kV according to patient size and/or use of iterative reconstruction technique. CONTRAST:  36m OMNIPAQUE IOHEXOL 350 MG/ML SOLN COMPARISON:  CT Abdomen and Pelvis 12/12/2021.  Chest CT 12/15/2010. FINDINGS: CTA CHEST FINDINGS Cardiovascular: Excellent contrast bolus timing in the pulmonary arterial tree. No focal filling  defect identified in the pulmonary arteries to suggest acute pulmonary embolism. No contrast in the aorta. Calcified aortic atherosclerosis. Cardiac size remains within normal limits. No pericardial effusion. Mediastinum/Nodes: No mediastinal mass or lymphadenopathy. Lungs/Pleura: Small bilateral layering pleural effusions. Major airways are patent. Chronic calcified granuloma in the right lower lung is stable since 2012 on series 6, image 83. Upper lobe perihilar and peribronchial confluent ground-glass opacity is mildly asymmetric (series 6, image 36). More confluent right apical opacity is in part related to scarring which was present in 2012. Lung bases are largely clear. Mild compressive atelectasis. No pulmonary consolidation at this time. Musculoskeletal: No acute or suspicious osseous lesion identified in the chest. Review of the MIP images confirms the above findings. CT ABDOMEN and PELVIS FINDINGS Hepatobiliary: Absent gallbladder.  Stable and negative liver. Pancreas: Negative. Spleen: Stable. Coarsely calcified splenic hilar aneurysm is unchanged. No perisplenic fluid. Adrenals/Urinary Tract: Stable and negative. Incidental pelvic phleboliths. Stomach/Bowel: Regression of rectosigmoid colon inflammation since 12/12/2021. Redundant large bowel with retained stool throughout. But no discrete large bowel inflammation is identified now. The cecum is located in the pelvis. Distal small bowel is nondilated. Appendix not clearly identified but no evidence of acute appendicitis. No dilated small bowel in the abdomen. Stomach is decompressed. No free air or free fluid identified. Vascular/Lymphatic: Calcified aortic atherosclerosis. Normal caliber abdominal aorta. Major arterial structures in the abdomen and pelvis remain patent. Mild for age iliofemoral atherosclerosis. Portal venous system  is patent. No lymphadenopathy identified. Reproductive: Surgically absent uterus. Diminutive or absent ovaries. Other: No  pelvic free fluid. Musculoskeletal: Stable.  No acute osseous abnormality identified. Review of the MIP images confirms the above findings. IMPRESSION: 1. Negative for acute pulmonary embolism. 2. Small layering pleural effusions with nonspecific asymmetric bilateral upper lobe and perihilar opacity. Differential considerations include pulmonary edema, viral/atypical respiratory pneumonia. 3. Regressed rectosigmoid colon inflammation since 12/12/2021, and no discrete large bowel inflammation now. Retained stool throughout redundant colon. No evidence of bowel obstruction. 4. No other acute or inflammatory process identified in the chest, abdomen, or pelvis. 5. Aortic Atherosclerosis (ICD10-I70.0). Electronically Signed   By: Genevie Ann M.D.   On: 12/24/2021 05:59   DG Chest Port 1 View  Result Date: 12/24/2021 CLINICAL DATA:  Shortness of breath, chest pain EXAM: PORTABLE CHEST 1 VIEW COMPARISON:  12/16/2021 FINDINGS: There is hyperinflation of the lungs compatible with COPD. Small bilateral pleural effusions. Chronic interstitial prominence throughout the lungs. Increasing patchy right upper lobe opacity. Heart is normal size. No acute bony abnormality. IMPRESSION: COPD/chronic changes. Increasing patchy opacity in the right upper lobe. Cannot exclude pneumonia. Small bilateral effusions. Electronically Signed   By: Rolm Baptise M.D.   On: 12/24/2021 03:31   DG CHEST PORT 1 VIEW  Result Date: 12/16/2021 CLINICAL DATA:  Dyspnea. EXAM: PORTABLE CHEST 1 VIEW COMPARISON:  01/29/2020 FINDINGS: 0537 hours. Lungs are hyperexpanded. Interstitial markings are diffusely coarsened with chronic features. Nodule in the right mid lung is stable, likely calcified granuloma. Cardiopericardial silhouette is at upper limits of normal for size. Bones are diffusely demineralized. Telemetry leads overlie the chest. IMPRESSION: Hyperexpansion with chronic interstitial coarsening. No acute cardiopulmonary findings. Electronically  Signed   By: Misty Stanley M.D.   On: 12/16/2021 06:25   US Abdomen Limited RUQ (LIVER/GB)  Result Date: 12/15/2021 CLINICAL DATA:  Elevated LFTs. EXAM: ULTRASOUND ABDOMEN LIMITED RIGHT UPPER QUADRANT COMPARISON:  CT abdomen/pelvis 12/12/2021 FINDINGS: Gallbladder: Prior cholecystectomy. Common bile duct: Diameter: 3 mm. Liver: No focal lesion identified. Within normal limits in parenchymal echogenicity. Portal vein is patent on color Doppler imaging with normal direction of blood flow towards the liver. Other: No free fluid. IMPRESSION: 1.  No acute findings. 2.  Prior cholecystectomy. Electronically Signed   By: Marin Olp M.D.   On: 12/15/2021 09:52       The results of significant diagnostics from this hospitalization (including imaging, microbiology, ancillary and laboratory) are listed below for reference.     Microbiology: Recent Results (from the past 240 hour(s))  C Difficile Quick Screen w PCR reflex     Status: Abnormal   Collection Time: 01/10/22  3:38 PM   Specimen: STOOL  Result Value Ref Range Status   C Diff antigen POSITIVE (A) NEGATIVE Final   C Diff toxin NEGATIVE NEGATIVE Final   C Diff interpretation Results are indeterminate. See PCR results.  Final    Comment: Performed at [facility], [place] [address], [place], [place] [address]  C. Diff by PCR, Reflexed     Status: Abnormal   Collection Time: 01/10/22  3:38 PM  Result Value Ref Range Status   Toxigenic C. Difficile by PCR POSITIVE (A) NEGATIVE Final    Comment: Positive for toxigenic C. difficile with little to no toxin production. Only treat if clinical presentation suggests symptomatic illness. Performed at [facility], [place] [address], [place], [place] [address]      Labs:  CBC: Recent Labs  Lab  01/09/22 1726 01/10/22 1224 01/11/22 1023 01/12/22 0304 01/13/22 0528  WBC 9.9 5.8 8.5 6.2 5.7  NEUTROABS  --  4.7 6.9  --   --   HGB 12.6 11.5* 12.2 11.0* 12.4  HCT 37.5 34.6* 36.4 31.8*  36.8  MCV 94.0 94.0 95.5 92.2 92.2  PLT 319 241 263 217 224   BMP &GFR Recent Labs  Lab 01/09/22 1726 01/10/22 1224 01/11/22 1023 01/12/22 0304 01/13/22 0528  NA 134* 134* 132* 130* 130*  K 4.0 4.2 3.6 3.8 3.4*  CL 98 99 101 102 98  CO2 '25 27 24 '$ 19* 21*  GLUCOSE 115* 105* 109* 101* 107*  BUN '21 21 12 '$ 5* <5*  CREATININE 0.66 0.65 0.71 0.51 0.53  CALCIUM 10.0 9.4 8.8* 8.3* 8.9  MG  --   --   --   --  1.5*  PHOS  --   --   --   --  2.4*   Estimated Creatinine Clearance: 34 mL/min (by C-G formula based on SCr of 0.53 mg/dL). Liver & Pancreas: Recent Labs  Lab 01/09/22 1726 01/10/22 1224 01/11/22 1023 01/13/22 0528  AST '21 18 26  '$ --   ALT '24 20 24  '$ --   ALKPHOS 126 106 105  --   BILITOT 0.6 0.5 0.4  --   PROT 7.2 6.3* 6.2*  --   ALBUMIN 4.2 3.7 3.0* 2.2*   Recent Labs  Lab 01/09/22 1726 01/10/22 1224  LIPASE 24 27   No results for input(s): "AMMONIA" in the last 168 hours. Diabetic: No results for input(s): "HGBA1C" in the last 72 hours. Recent Labs  Lab 01/10/22 1249  GLUCAP 94   Cardiac Enzymes: No results for input(s): "CKTOTAL", "CKMB", "CKMBINDEX", "TROPONINI" in the last 168 hours. No results for input(s): "PROBNP" in the last 8760 hours. Coagulation Profile: No results for input(s): "INR", "PROTIME" in the last 168 hours. Thyroid Function Tests: No results for input(s): "TSH", "T4TOTAL", "FREET4", "T3FREE", "THYROIDAB" in the last 72 hours. Lipid Profile: No results for input(s): "CHOL", "HDL", "LDLCALC", "TRIG", "CHOLHDL", "LDLDIRECT" in the last 72 hours. Anemia Panel: No results for input(s): "VITAMINB12", "FOLATE", "FERRITIN", "TIBC", "IRON", "RETICCTPCT" in the last 72 hours. Urine analysis:    Component Value Date/Time   COLORURINE YELLOW 01/11/2022 [place] 01/11/2022 1039   LABSPEC 1.025 01/11/2022 1039   PHURINE 5.5 01/11/2022 1039   GLUCOSEU NEGATIVE 01/11/2022 1039   GLUCOSEU NEGATIVE 04/05/2021 1236   HGBUR SMALL  (A) 01/11/2022 1039   BILIRUBINUR NEGATIVE 01/11/2022 1039   KETONESUR 15 (A) 01/11/2022 1039   PROTEINUR NEGATIVE 01/11/2022 1039   UROBILINOGEN 0.2 04/05/2021 1236   NITRITE NEGATIVE 01/11/2022 1039   LEUKOCYTESUR SMALL (A) 01/11/2022 1039   Sepsis Labs: Invalid input(s): "PROCALCITONIN", "LACTICIDVEN"   SIGNED:  Mercy Riding, MD  [facility] 01/13/2022, 4:56 PM

## 2022-01-15 ENCOUNTER — Telehealth: Payer: Self-pay

## 2022-01-15 ENCOUNTER — Other Ambulatory Visit (HOSPITAL_COMMUNITY): Payer: Self-pay

## 2022-01-15 NOTE — Telephone Encounter (Signed)
Transition Care Management Follow-up Telephone Call Date of discharge and from where: [facility] 01-13-22 Dx: C-Diff colitis How have you been since you were released from the hospital? Doing good  Any questions or concerns? No  Items Reviewed: Did the pt receive and understand the discharge instructions provided? Yes  Medications obtained and verified? Yes  Other? No  Any new allergies since your discharge? No  Dietary orders reviewed? Yes Do you have support at home? Yes   Home Care and Equipment/Supplies: Were home health services ordered? no If so, what is the name of the agency? na  Has the agency set up a time to come to the patient's home? not applicable Were any new equipment or medical supplies ordered?  No What is the name of the medical supply agency? na Were you able to get the supplies/equipment? not applicable Do you have any questions related to the use of the equipment or supplies? No  Functional Questionnaire: (I = Independent and D = Dependent) ADLs: I  Bathing/Dressing- I  Meal Prep- I  Eating- I  Maintaining continence- I  Transferring/Ambulation- I  Managing Meds- I  Follow up appointments reviewed:  PCP Hospital f/u appt confirmed? Yes  Scheduled to see Dr Alain Marion on 01-16-22 @ 220pm. [facility] f/u appt confirmed? No . Are transportation arrangements needed? No  If their condition worsens, is the pt aware to call PCP or go to the Emergency Dept.? Yes Was the patient provided with contact information for the PCP's office or ED? Yes Was to pt encouraged to call back with questions or concerns? Yes

## 2022-01-16 ENCOUNTER — Encounter: Payer: Self-pay | Admitting: Internal Medicine

## 2022-01-16 ENCOUNTER — Ambulatory Visit (INDEPENDENT_AMBULATORY_CARE_PROVIDER_SITE_OTHER): Payer: Medicare Other | Admitting: Internal Medicine

## 2022-01-16 ENCOUNTER — Other Ambulatory Visit (HOSPITAL_COMMUNITY): Payer: Self-pay

## 2022-01-16 DIAGNOSIS — A0472 Enterocolitis due to Clostridium difficile, not specified as recurrent: Secondary | ICD-10-CM | POA: Diagnosis not present

## 2022-01-16 DIAGNOSIS — R634 Abnormal weight loss: Secondary | ICD-10-CM

## 2022-01-16 DIAGNOSIS — R413 Other amnesia: Secondary | ICD-10-CM

## 2022-01-16 DIAGNOSIS — I480 Paroxysmal atrial fibrillation: Secondary | ICD-10-CM | POA: Diagnosis not present

## 2022-01-16 MED ORDER — MEMANTINE HCL 5 MG PO TABS: 5.0000 mg | ORAL_TABLET | Freq: Two times a day (BID) | ORAL | 5 refills | Status: DC

## 2022-01-16 NOTE — Assessment & Plan Note (Signed)
Improve nutrition Start Namenda at low dose.

## 2022-01-16 NOTE — Assessment & Plan Note (Addendum)
Last episodes x2 were treated w/ Dificid Finish treatment

## 2022-01-16 NOTE — Assessment & Plan Note (Signed)
Better. Diet discussed 

## 2022-01-16 NOTE — Progress Notes (Signed)
Subjective:  Patient ID: Julie Montgomery, female    DOB: [birth year]  Age: 86 y.o. MRN: [id number]  CC: Follow-up (TCM Hosp F/U)   HPI JAMACIA JESTER presents for C diff colitis episode #3 C/o memory loss C/o ankle swelling  Per hx:  "Admit date: 01/11/2022 Discharge date: 01/13/2022 Admitted From: Home Disposition: Home Recommendations for Outpatient Follow-up:  Follow up with PCP in 1 to 2 weeks Check BMP and CBC at follow-up Please follow up on the following pending results: None   Home Health: Not indicated Equipment/Devices: Not indicated   Discharge Condition: Stable CODE STATUS: Full code   Follow-up Information       Enzley Kitchens, Evie Lacks, MD. Schedule an appointment as soon as possible for a visit in 1 week(s).   Specialty: Internal Medicine Contact information: [place] [id number] [facility] 86 year old F with PMH of C. difficile colitis (2016, 2018 and 11/2021), paroxysmal A-fib not on AC, HTN, anxiety, depression, bronchiectasis and severe malnutrition with unintentional weight loss presenting with diarrhea and abdominal pain for 3 to 4 days and admitted for recurrent C. difficile colitis.  Recently hospitalized from 8/15-8/21 for C. difficile colitis presumed to be provoked by Keflex she had for scalp infection, and treated with 10 days of deficit.    On admission, hemodynamically stable.  Afebrile.  No leukocytosis.  C. difficile testing positive for antigen but negative for toxin.  PCR positive.  CT abdomen and pelvis showed diffuse colonic inflammation consistent with C. difficile infection.  She had no leukocytosis.  She was started on Dificid for 10 days per discussion between the admitting provider and infectious disease.   On the day of discharge, diarrhea improved tremendously.  Patient tolerated solid food and felt well to go home and finish antibiotic course at home.   See  individual problem list below for more.    Problems addressed during this hospitalization Principal Problem:   C. difficile colitis Active Problems:   Protein-calorie malnutrition, severe ([place])   Bronchiectasis ([place])   Paroxysmal A-fib ([place])   Clostridioides difficile infection   Hyponatremia   Metabolic acidosis   Recurrent C. difficile colitis-seems like fourth episode (2016, 2018 and last month).  C. difficile PCR positive.  CT with new diffuse colonic inflammation consistent with C. difficile.  Diarrhea and pain improved significantly. -Discharged on Dificid for 8 more days   Hyponatremia/metabolic acidosis/severe dehydration: Likely due to C. difficile colitis.  Resolved. -Recheck BMP at follow-up   Paroxysmal A-fib: New diagnosis at her last hospitalization last months. -Continue home metoprolol and Eliquis.   Essential hypertension: Normotensive. -Continue home metoprolol. -Discontinue Lasix given risk for dehydration   Hypokalemia/hypophosphatemia/hypomagnesemia: Replenished prior to discharge.   Severe protein calorie malnutrition: As evidenced by low BMI and significant muscle mass and subcu fat loss. -Body mass index is 16.28 kg/m."        Outpatient Medications Prior to Visit  Medication Sig Dispense Refill   apixaban (ELIQUIS) 2.5 MG TABS tablet Take 1 tablet (2.5 mg total) by mouth 2 (two) times daily. 60 tablet 0   fidaxomicin (DIFICID) 200 MG TABS tablet Take 1 tablet (200 mg total) by mouth 2 (two) times daily. Take for 8 days starting this evening and discard the rest 20 tablet 0   metoprolol succinate (TOPROL XL) 25 MG 24 hr  tablet Take 1 tablet (25 mg total) by mouth every morning. Hold if systolic blood pressure (top number) less than 100 mmHg or pulse less than 60 bpm. 90 tablet 0   mupirocin ointment (BACTROBAN) 2 % Apply 1 Application topically daily.     Probiotic Product (ALIGN) 4 MG CAPS Take 1 capsule (4 mg total) by mouth daily. 30 capsule 1    No facility-administered medications prior to visit.    ROS: Review of Systems  Constitutional:  Positive for fatigue. Negative for activity change, appetite change, chills and unexpected weight change.  HENT:  Negative for congestion, mouth sores and sinus pressure.   Eyes:  Negative for visual disturbance.  Respiratory:  Negative for cough and chest tightness.   Cardiovascular:  Positive for leg swelling.  Gastrointestinal:  Negative for abdominal pain, diarrhea and nausea.  Genitourinary:  Negative for difficulty urinating, frequency and vaginal pain.  Musculoskeletal:  Negative for back pain and gait problem.  Skin:  Negative for pallor and rash.  Neurological:  Negative for dizziness, tremors, weakness, numbness and headaches.  Psychiatric/Behavioral:  Positive for decreased concentration. Negative for confusion and sleep disturbance. The patient is nervous/anxious.     Objective:  BP 128/62 (BP Location: Left Arm)   Pulse 83   Temp 98 F (36.7 C) (Oral)   Ht '5\' 2"'$  (1.575 m)   Wt 91 lb 9.6 oz (41.5 kg)   SpO2 96%   BMI 16.75 kg/m   BP Readings from Last 3 Encounters:  01/16/22 128/62  01/13/22 113/87  01/10/22 (!) 145/73    Wt Readings from Last 3 Encounters:  01/16/22 91 lb 9.6 oz (41.5 kg)  01/11/22 89 lb (40.4 kg)  01/09/22 91 lb (41.3 kg)    Physical Exam Constitutional:      General: She is not in acute distress.    Appearance: Normal appearance. She is well-developed.  HENT:     Head: Normocephalic.     Right Ear: External ear normal.     Left Ear: External ear normal.     Nose: Nose normal.  Eyes:     General:        Right eye: No discharge.        Left eye: No discharge.     Conjunctiva/sclera: Conjunctivae normal.     Pupils: Pupils are equal, round, and reactive to light.  Neck:     Thyroid: No thyromegaly.     Vascular: No JVD.     Trachea: No tracheal deviation.  Cardiovascular:     Rate and Rhythm: Normal rate and regular rhythm.      Heart sounds: Normal heart sounds.  Pulmonary:     Effort: No respiratory distress.     Breath sounds: No stridor. No wheezing.  Abdominal:     General: Bowel sounds are normal. There is no distension.     Palpations: Abdomen is soft. There is no mass.     Tenderness: There is no abdominal tenderness. There is no guarding or rebound.  Musculoskeletal:        General: No tenderness.     Cervical back: Normal range of motion and neck supple. No rigidity.  Lymphadenopathy:     Cervical: No cervical adenopathy.  Skin:    Findings: No erythema or rash.  Neurological:     Mental Status: She is oriented to person, place, and time.     Cranial Nerves: No cranial nerve deficit.     Motor: No abnormal muscle tone.  Coordination: Coordination normal.     Deep Tendon Reflexes: Reflexes normal.  Psychiatric:        Behavior: Behavior normal.        Thought Content: Thought content normal.        Judgment: Judgment normal.     Lab Results  Component Value Date   WBC 5.7 01/13/2022   HGB 12.4 01/13/2022   HCT 36.8 01/13/2022   PLT 224 01/13/2022   GLUCOSE 107 (H) 01/13/2022   CHOL 156 12/15/2021   TRIG 84 12/15/2021   HDL 53 12/15/2021   LDLDIRECT 82 12/15/2021   LDLCALC 86 12/15/2021   ALT 24 01/11/2022   AST 26 01/11/2022   NA 130 (L) 01/13/2022   K 3.4 (L) 01/13/2022   CL 98 01/13/2022   CREATININE 0.53 01/13/2022   BUN <5 (L) 01/13/2022   CO2 21 (L) 01/13/2022   TSH 6.357 (H) 12/14/2021    CT Abdomen Pelvis W Contrast  Result Date: 01/11/2022 CLINICAL DATA:  Abdominal pain, acute, nonlocalized. C diff, assess for degree of colitis. EXAM: CT ABDOMEN AND PELVIS WITH CONTRAST TECHNIQUE: Multidetector CT imaging of the abdomen and pelvis was performed using the standard protocol following bolus administration of intravenous contrast. RADIATION DOSE REDUCTION: This exam was performed according to the departmental dose-optimization program which includes automated exposure  control, adjustment of the mA and/or kV according to patient size and/or use of iterative reconstruction technique. CONTRAST:  70m OMNIPAQUE IOHEXOL 350 MG/ML SOLN COMPARISON:  CT abdomen and pelvis 12/24/2021 FINDINGS: Lower chest: Small, partly clustered nodular densities located predominantly peripherally in both lung bases. Scattered mucous plugging in the airways. Resolved pleural effusions. Hepatobiliary: No focal liver abnormality is seen. Status post cholecystectomy. No biliary dilatation. Pancreas: Unremarkable. Spleen: Unremarkable. Adrenals/Urinary Tract: Unremarkable adrenal glands. 1.5 cm left renal cyst for which no follow-up imaging is recommended. No renal calculi or hydronephrosis. Unremarkable bladder. Stomach/Bowel: The stomach is unremarkable. There is no evidence of bowel obstruction. There is new wall thickening diffusely involving the colon which appears worst in the right colon. Gas and scattered small fluid levels are present throughout the colon, and there is surrounding inflammatory stranding diffusely about the colon and rectum. Vascular/Lymphatic: Abdominal aortic atherosclerosis. Unchanged 1.5 cm coarsely calcified splenic artery aneurysm. No enlarged lymph nodes. Reproductive: Status post hysterectomy. No adnexal masses. Other: Small volume pelvic free fluid.  No pneumoperitoneum. Musculoskeletal: No acute osseous abnormality or suspicious osseous lesion. Advanced lower lumbar facet arthrosis. IMPRESSION: 1. New diffuse colonic inflammation consistent with the provided history of C difficile colitis. 2. Small volume pelvic free fluid. 3. Small peripheral nodular densities in both lung bases, likely infectious/inflammatory. 4. Aortic Atherosclerosis (ICD10-I70.0). Electronically Signed   By: ALogan BoresM.D.   On: 01/11/2022 15:51    Assessment & Plan:   Problem List Items Addressed This Visit     C. difficile colitis     Last episodes x2 were treated w/ Dificid Finish  treatment      Clostridium difficile colitis     Last episodes x2 were treated w/ Dificid Finish Dificid po      Memory problem    Improve nutrition Start Namenda at low dose.       Paroxysmal A-fib (HCC)    In NSR      Weight loss    Better  Diet discussed         Meds ordered this encounter  Medications   memantine (NAMENDA) 5 MG tablet    Sig:  Take 1 tablet (5 mg total) by mouth 2 (two) times daily.    Dispense:  60 tablet    Refill:  5      Follow-up: Return in about 6 weeks (around 02/27/2022) for a follow-up visit.  Walker Kehr, MD

## 2022-01-16 NOTE — Assessment & Plan Note (Signed)
Last episodes x2 were treated w/ Dificid Finish Dificid po

## 2022-01-16 NOTE — Assessment & Plan Note (Signed)
In NSR 

## 2022-01-17 NOTE — Patient Outreach (Signed)
ELANIA CROWL [birth year] [id number]  *Readmission review request - [facility] Readmission Report  *[name] electronic medical record was reviewed for less than 30 days readmission  Primary Care Provider: Plotnikov, Evie Lacks, MD, [place]   Review for admission completed and information to be sent to referral source per review request.  Natividad Brood, RN BSN [place]  [phone] business mobile phone Toll free office [phone]  *[place]  [phone] Fax number: [phone] [email or web address]

## 2022-01-22 ENCOUNTER — Other Ambulatory Visit: Payer: Federal, State, Local not specified - PPO

## 2022-01-26 ENCOUNTER — Encounter: Payer: Self-pay | Admitting: Internal Medicine

## 2022-01-26 ENCOUNTER — Ambulatory Visit (INDEPENDENT_AMBULATORY_CARE_PROVIDER_SITE_OTHER): Payer: Medicare Other | Admitting: Internal Medicine

## 2022-01-26 ENCOUNTER — Ambulatory Visit (INDEPENDENT_AMBULATORY_CARE_PROVIDER_SITE_OTHER): Payer: Medicare Other

## 2022-01-26 VITALS — BP 126/60 | HR 76 | Temp 98.2°F | Ht 62.0 in | Wt 89.0 lb

## 2022-01-26 DIAGNOSIS — I480 Paroxysmal atrial fibrillation: Secondary | ICD-10-CM | POA: Diagnosis not present

## 2022-01-26 DIAGNOSIS — R0600 Dyspnea, unspecified: Secondary | ICD-10-CM

## 2022-01-26 DIAGNOSIS — F411 Generalized anxiety disorder: Secondary | ICD-10-CM | POA: Diagnosis not present

## 2022-01-26 LAB — CBC WITH DIFFERENTIAL/PLATELET
Basophils Absolute: 0.1 10*3/uL (ref 0.0–0.1)
Basophils Relative: 0.7 % (ref 0.0–3.0)
Eosinophils Absolute: 0 10*3/uL (ref 0.0–0.7)
Eosinophils Relative: 0.4 % (ref 0.0–5.0)
HCT: 35.4 % — ABNORMAL LOW (ref 36.0–46.0)
Hemoglobin: 11.9 g/dL — ABNORMAL LOW (ref 12.0–15.0)
Lymphocytes Relative: 13.4 % (ref 12.0–46.0)
Lymphs Abs: 1.1 10*3/uL (ref 0.7–4.0)
MCHC: 33.6 g/dL (ref 30.0–36.0)
MCV: 93.2 fl (ref 78.0–100.0)
Monocytes Absolute: 0.5 10*3/uL (ref 0.1–1.0)
Monocytes Relative: 6.3 % (ref 3.0–12.0)
Neutro Abs: 6.2 10*3/uL (ref 1.4–7.7)
Neutrophils Relative %: 79.2 % — ABNORMAL HIGH (ref 43.0–77.0)
Platelets: 337 10*3/uL (ref 150.0–400.0)
RBC: 3.8 Mil/uL — ABNORMAL LOW (ref 3.87–5.11)
RDW: 14 % (ref 11.5–15.5)
WBC: 7.9 10*3/uL (ref 4.0–10.5)

## 2022-01-26 LAB — HEPATIC FUNCTION PANEL
ALT: 22 U/L (ref 0–35)
AST: 22 U/L (ref 0–37)
Albumin: 3.9 g/dL (ref 3.5–5.2)
Alkaline Phosphatase: 106 U/L (ref 39–117)
Bilirubin, Direct: 0.1 mg/dL (ref 0.0–0.3)
Total Bilirubin: 0.4 mg/dL (ref 0.2–1.2)
Total Protein: 7 g/dL (ref 6.0–8.3)

## 2022-01-26 LAB — BASIC METABOLIC PANEL
BUN: 22 mg/dL (ref 6–23)
CO2: 28 mEq/L (ref 19–32)
Calcium: 9.6 mg/dL (ref 8.4–10.5)
Chloride: 99 mEq/L (ref 96–112)
Creatinine, Ser: 0.65 mg/dL (ref 0.40–1.20)
GFR: 81.16 mL/min (ref >60.00)
Glucose, Bld: 103 mg/dL — ABNORMAL HIGH (ref 70–99)
Potassium: 4.2 mEq/L (ref 3.5–5.1)
Sodium: 134 mEq/L — ABNORMAL LOW (ref 135–145)

## 2022-01-26 LAB — TSH: TSH: 3.61 u[IU]/mL (ref 0.35–5.50)

## 2022-01-26 LAB — BRAIN NATRIURETIC PEPTIDE: Pro B Natriuretic peptide (BNP): 68 pg/mL (ref 0.0–100.0)

## 2022-01-26 MED ORDER — CITALOPRAM HYDROBROMIDE 10 MG PO TABS: 10.0000 mg | ORAL_TABLET | Freq: Every day | ORAL | 3 refills | Status: DC

## 2022-01-26 NOTE — Assessment & Plan Note (Signed)
Exam benign, but for trial albuaterol hfa inhaler prn, check cxr, bnp cbc with labs

## 2022-01-26 NOTE — Progress Notes (Signed)
Patient ID: Julie Montgomery, female   DOB: Dec 12, 1935, 86 y.o.   MRN: [id number]        Chief Complaint: follow up dyspnea ? Anxiety related       HPI:  Julie Montgomery is a 86 y.o. female here with c/o anxiety, to which the patient suggests anxiety with hyperventilation recently as likely cause.in the past wk.   Just cant catch deep breaths     No hx of chf, and Pt denies chest pain, wheezing, orthopnea, PND, increased LE swelling, palpitations, dizziness or syncope.   Pt denies fever, wt loss, night sweats, loss of appetite, or other constitutional symptoms      Pt denies polydipsia, polyuria, or new focal neuro s/s.     Wt Readings from Last 3 Encounters:  01/26/22 89 lb (40.4 kg)  01/16/22 91 lb 9.6 oz (41.5 kg)  01/11/22 89 lb (40.4 kg)   BP Readings from Last 3 Encounters:  01/26/22 126/60  01/16/22 128/62  01/13/22 113/87         Past Medical History:  Diagnosis Date   Adenomatous polyp of colon 2020   Allergy    Anxiety    Basal cell carcinoma (BCC)    Cataract    Depression    Helicobacter pylori gastritis    Osteopenia    Pneumonia    hx of 03/2008   Stroke [facility])    TIA long time ago   Past Surgical History:  Procedure Laterality Date   APPENDECTOMY     BREAST LUMPECTOMY     right   CATARACT EXTRACTION W/ INTRAOCULAR LENS  IMPLANT, BILATERAL Bilateral    CESAREAN SECTION     CHOLECYSTECTOMY     MOHS SURGERY     PARTIAL HYSTERECTOMY     ROTATOR CUFF REPAIR  2012   SKIN GRAFT     teeth remmoved      reports that she has never smoked. She has never used smokeless tobacco. She reports that she does not drink alcohol and does not use drugs. family history includes Cancer in her brother and sister; Colon cancer in her son; Coronary artery disease in her brother; Dementia in her brother; Diabetes in her sister; Stomach cancer in her paternal grandmother. Allergies  Allergen Reactions   Lovastatin Other (See Comments)    Unknown reaction   Mometasone  Furo-Formoterol Fum Other (See Comments)    Loss of appetite, laryngitis    Prozac [Fluoxetine Hcl] Other (See Comments)    Jumpy   Sulfa Antibiotics Other (See Comments)    Unknown reaction   Current Outpatient Medications on File Prior to Visit  Medication Sig Dispense Refill   mupirocin ointment (BACTROBAN) 2 % Apply 1 Application topically daily.     Probiotic Product (ALIGN) 4 MG CAPS Take 1 capsule (4 mg total) by mouth daily. 30 capsule 1   No current facility-administered medications on file prior to visit.        ROS:  All others reviewed and negative.  Objective        PE:  BP 126/60 (BP Location: Right Arm, Patient Position: Sitting, Cuff Size: Large)   Pulse 76   Temp 98.2 F (36.8 C) (Oral)   Ht '5\' 2"'$  (1.575 m)   Wt 89 lb (40.4 kg)   SpO2 94%   BMI 16.28 kg/m                 Constitutional: Pt appears in NAD  HENT: Head: NCAT.                Right Ear: External ear normal.                 Left Ear: External ear normal.                Eyes: . Pupils are equal, round, and reactive to light. Conjunctivae and EOM are normal               Nose: without d/c or deformity               Neck: Neck supple. Gross normal ROM               Cardiovascular: Normal rate and regular rhythm.                 Pulmonary/Chest: Effort normal and breath sounds without rales or wheezing.                Abd:  Soft, NT, ND, + BS, no organomegaly               Neurological: Pt is alert. At baseline orientation, motor grossly intact               Skin: Skin is warm. No rashes, no other new lesions, LE edema - none               Psychiatric: Pt behavior is normal without agitation ; nervous2+  Micro: none  Cardiac tracings I have personally interpreted today:  none  Pertinent Radiological findings (summarize): none   Lab Results  Component Value Date   WBC 7.9 01/26/2022   HGB 11.9 (L) 01/26/2022   HCT 35.4 (L) 01/26/2022   PLT 337.0 01/26/2022   GLUCOSE 103 (H)  01/26/2022   CHOL 156 12/15/2021   TRIG 84 12/15/2021   HDL 53 12/15/2021   LDLDIRECT 82 12/15/2021   LDLCALC 86 12/15/2021   ALT 22 01/26/2022   AST 22 01/26/2022   NA 134 (L) 01/26/2022   K 4.2 01/26/2022   CL 99 01/26/2022   CREATININE 0.65 01/26/2022   BUN 22 01/26/2022   CO2 28 01/26/2022   TSH 3.61 01/26/2022   Assessment/Plan:  Julie Montgomery is a 86 y.o. White or Caucasian [1] female with  has a past medical history of Adenomatous polyp of colon (2020), Allergy, Anxiety, Basal cell carcinoma (BCC), Cataract, Depression, Helicobacter pylori gastritis, Osteopenia, Pneumonia, and Stroke ([place]).  Anxiety disorder D/w pt  - ok to check tsh, but also start celexa 10 mg qd, declines need for counseling or psychiatry referral  Dyspnea Exam benign, but for trial albuaterol hfa inhaler prn, check cxr, bnp cbc with labs  Paroxysmal A-fib (HCC) Exam benign today, asymptomatic, has controlled rate and volume,  to f/u any worsening symptoms or concerns  Followup: Return if symptoms worsen or fail to improve.  Cathlean Cower, MD 01/26/2022 10:07 PM [facility]

## 2022-01-26 NOTE — Assessment & Plan Note (Signed)
D/w pt  - ok to check tsh, but also start celexa 10 mg qd, declines need for counseling or psychiatry referral

## 2022-01-26 NOTE — Assessment & Plan Note (Signed)
Exam benign today, asymptomatic, has controlled rate and volume,  to f/u any worsening symptoms or concerns

## 2022-01-26 NOTE — Patient Instructions (Signed)
Please take all new medication as prescribed - celexa 10 mg per day for anxiety  Please continue all other medications as before, and refills have been done if requested.  Please have the pharmacy call with any other refills you may need.  Please continue your efforts at being more active, low cholesterol diet, and weight control.  Please keep your appointments with your specialists as you may have planned  Please go to the XRAY Department in the first floor for the x-ray testing  Please go to the LAB at the blood drawing area for the tests to be done  You will be contacted by phone if any changes need to be made immediately.  Otherwise, you will receive a letter about your results with an explanation, but please check with MyChart first.  Please remember to sign up for MyChart if you have not done so, as this will be important to you in the future with finding out test results, communicating by private email, and scheduling acute appointments online when needed.

## 2022-01-29 ENCOUNTER — Other Ambulatory Visit (HOSPITAL_BASED_OUTPATIENT_CLINIC_OR_DEPARTMENT_OTHER): Payer: Self-pay

## 2022-02-01 ENCOUNTER — Ambulatory Visit: Payer: Federal, State, Local not specified - PPO | Admitting: Cardiology

## 2022-02-06 ENCOUNTER — Encounter (HOSPITAL_COMMUNITY): Payer: Self-pay

## 2022-02-06 ENCOUNTER — Inpatient Hospital Stay (HOSPITAL_COMMUNITY)
Admission: EM | Admit: 2022-02-06 | Discharge: 2022-02-12 | DRG: 391 | Disposition: A | Discharge status: Home / Self Care | Payer: Medicare Other | Attending: Internal Medicine | Admitting: Internal Medicine

## 2022-02-06 ENCOUNTER — Ambulatory Visit: Payer: Medicare Other | Admitting: Internal Medicine

## 2022-02-06 ENCOUNTER — Other Ambulatory Visit: Payer: Self-pay

## 2022-02-06 DIAGNOSIS — K5289 Other specified noninfective gastroenteritis and colitis: Secondary | ICD-10-CM | POA: Diagnosis present

## 2022-02-06 DIAGNOSIS — Z882 Allergy status to sulfonamides status: Secondary | ICD-10-CM | POA: Diagnosis not present

## 2022-02-06 DIAGNOSIS — E871 Hypo-osmolality and hyponatremia: Secondary | ICD-10-CM | POA: Diagnosis present

## 2022-02-06 DIAGNOSIS — R5381 Other malaise: Secondary | ICD-10-CM | POA: Diagnosis present

## 2022-02-06 DIAGNOSIS — Z833 Family history of diabetes mellitus: Secondary | ICD-10-CM

## 2022-02-06 DIAGNOSIS — R109 Unspecified abdominal pain: Secondary | ICD-10-CM | POA: Diagnosis not present

## 2022-02-06 DIAGNOSIS — E43 Unspecified severe protein-calorie malnutrition: Secondary | ICD-10-CM | POA: Diagnosis present

## 2022-02-06 DIAGNOSIS — Z79899 Other long term (current) drug therapy: Secondary | ICD-10-CM

## 2022-02-06 DIAGNOSIS — E861 Hypovolemia: Secondary | ICD-10-CM | POA: Diagnosis present

## 2022-02-06 DIAGNOSIS — E876 Hypokalemia: Secondary | ICD-10-CM | POA: Diagnosis not present

## 2022-02-06 DIAGNOSIS — Z8673 Personal history of transient ischemic attack (TIA), and cerebral infarction without residual deficits: Secondary | ICD-10-CM | POA: Diagnosis not present

## 2022-02-06 DIAGNOSIS — Z85828 Personal history of other malignant neoplasm of skin: Secondary | ICD-10-CM | POA: Diagnosis not present

## 2022-02-06 DIAGNOSIS — Z9049 Acquired absence of other specified parts of digestive tract: Secondary | ICD-10-CM

## 2022-02-06 DIAGNOSIS — A09 Infectious gastroenteritis and colitis, unspecified: Secondary | ICD-10-CM | POA: Diagnosis not present

## 2022-02-06 DIAGNOSIS — A0472 Enterocolitis due to Clostridium difficile, not specified as recurrent: Secondary | ICD-10-CM | POA: Diagnosis present

## 2022-02-06 DIAGNOSIS — J479 Bronchiectasis, uncomplicated: Secondary | ICD-10-CM | POA: Diagnosis present

## 2022-02-06 DIAGNOSIS — Z8619 Personal history of other infectious and parasitic diseases: Secondary | ICD-10-CM

## 2022-02-06 DIAGNOSIS — F03A3 Unspecified dementia, mild, with mood disturbance: Secondary | ICD-10-CM | POA: Diagnosis present

## 2022-02-06 DIAGNOSIS — I480 Paroxysmal atrial fibrillation: Secondary | ICD-10-CM | POA: Diagnosis not present

## 2022-02-06 DIAGNOSIS — Z800 Family history of malignant neoplasm of digestive organs: Secondary | ICD-10-CM | POA: Diagnosis not present

## 2022-02-06 DIAGNOSIS — E860 Dehydration: Secondary | ICD-10-CM | POA: Diagnosis not present

## 2022-02-06 DIAGNOSIS — E872 Acidosis, unspecified: Secondary | ICD-10-CM | POA: Diagnosis present

## 2022-02-06 DIAGNOSIS — Z888 Allergy status to other drugs, medicaments and biological substances status: Secondary | ICD-10-CM | POA: Diagnosis not present

## 2022-02-06 DIAGNOSIS — F03A4 Unspecified dementia, mild, with anxiety: Secondary | ICD-10-CM | POA: Diagnosis present

## 2022-02-06 DIAGNOSIS — R197 Diarrhea, unspecified: Secondary | ICD-10-CM | POA: Diagnosis not present

## 2022-02-06 DIAGNOSIS — F32A Depression, unspecified: Secondary | ICD-10-CM | POA: Diagnosis present

## 2022-02-06 DIAGNOSIS — Z86010 Personal history of colonic polyps: Secondary | ICD-10-CM

## 2022-02-06 DIAGNOSIS — Z681 Body mass index (BMI) 19 or less, adult: Secondary | ICD-10-CM

## 2022-02-06 DIAGNOSIS — N3000 Acute cystitis without hematuria: Secondary | ICD-10-CM | POA: Diagnosis not present

## 2022-02-06 DIAGNOSIS — Z801 Family history of malignant neoplasm of trachea, bronchus and lung: Secondary | ICD-10-CM

## 2022-02-06 DIAGNOSIS — Z8249 Family history of ischemic heart disease and other diseases of the circulatory system: Secondary | ICD-10-CM

## 2022-02-06 LAB — COMPREHENSIVE METABOLIC PANEL
ALT: 23 U/L (ref 0–44)
AST: 23 U/L (ref 15–41)
Albumin: 3.5 g/dL (ref 3.5–5.0)
Alkaline Phosphatase: 103 U/L (ref 38–126)
Anion gap: 10 (ref 5–15)
BUN: 24 mg/dL — ABNORMAL HIGH (ref 8–23)
CO2: 21 mmol/L — ABNORMAL LOW (ref 22–32)
Calcium: 9.2 mg/dL (ref 8.9–10.3)
Chloride: 99 mmol/L (ref 98–111)
Creatinine, Ser: 0.59 mg/dL (ref 0.44–1.00)
GFR, Estimated: >60 mL/min (ref >60)
Glucose, Bld: 103 mg/dL — ABNORMAL HIGH (ref 70–99)
Potassium: 3.7 mmol/L (ref 3.5–5.1)
Sodium: 130 mmol/L — ABNORMAL LOW (ref 135–145)
Total Bilirubin: 0.6 mg/dL (ref 0.3–1.2)
Total Protein: 6.6 g/dL (ref 6.5–8.1)

## 2022-02-06 LAB — CBC WITH DIFFERENTIAL/PLATELET
Abs Immature Granulocytes: 0.01 10*3/uL (ref 0.00–0.07)
Basophils Absolute: 0 10*3/uL (ref 0.0–0.1)
Basophils Relative: 0 %
Eosinophils Absolute: 0 10*3/uL (ref 0.0–0.5)
Eosinophils Relative: 0 %
HCT: 37.2 % (ref 36.0–46.0)
Hemoglobin: 12 g/dL (ref 12.0–15.0)
Immature Granulocytes: 0 %
Lymphocytes Relative: 7 %
Lymphs Abs: 0.5 10*3/uL — ABNORMAL LOW (ref 0.7–4.0)
MCH: 30.8 pg (ref 26.0–34.0)
MCHC: 32.3 g/dL (ref 30.0–36.0)
MCV: 95.4 fL (ref 80.0–100.0)
Monocytes Absolute: 0.4 10*3/uL (ref 0.1–1.0)
Monocytes Relative: 6 %
Neutro Abs: 6.1 10*3/uL (ref 1.7–7.7)
Neutrophils Relative %: 87 %
Platelets: 248 10*3/uL (ref 150–400)
RBC: 3.9 MIL/uL (ref 3.87–5.11)
RDW: 14.2 % (ref 11.5–15.5)
WBC: 7 10*3/uL (ref 4.0–10.5)
nRBC: 0 % (ref 0.0–0.2)

## 2022-02-06 LAB — URINALYSIS, ROUTINE W REFLEX MICROSCOPIC
Bilirubin Urine: NEGATIVE
Glucose, UA: NEGATIVE mg/dL
Ketones, ur: 5 mg/dL — AB
Nitrite: NEGATIVE
Protein, ur: 30 mg/dL — AB
Specific Gravity, Urine: 1.025 (ref 1.005–1.030)
WBC, UA: >50 WBC/hpf — ABNORMAL HIGH (ref 0–5)
pH: 5 (ref 5.0–8.0)

## 2022-02-06 LAB — LIPASE, BLOOD: Lipase: 31 U/L (ref 11–51)

## 2022-02-06 NOTE — ED Provider Triage Note (Signed)
Emergency Medicine Provider Triage Evaluation Note  Julie Montgomery , a 86 y.o. female  was evaluated in triage.  Pt complains of diarrhea associated with abdominal pain.  Had C. difficile in September.  Admitted to hospital on 9/14 for C. difficile.  No further antibiotics after admission.  Review of Systems  Positive: diarrhea Negative: fever  Physical Exam  BP (!) 167/95 (BP Location: Right Arm)   Pulse 94   Temp 99.4 F (37.4 C) (Oral)   Resp 20   Ht '5\' 2"'$  (1.575 m)   Wt 40.4 kg   SpO2 100%   BMI 16.28 kg/m  Gen:   Awake, no distress   Resp:  Normal effort  MSK:   Moves extremities without difficulty  Other:    Medical Decision Making  Medically screening exam initiated at 5:59 PM.  Appropriate orders placed.  Julie Montgomery was informed that the remainder of the evaluation will be completed by another provider, this initial triage assessment does not replace that evaluation, and the importance of remaining in the ED until their evaluation is complete.  Labs Stool sample, C. Diff positive <30 days ago   Suzy Bouchard, [place] 02/06/22 1800

## 2022-02-06 NOTE — ED Triage Notes (Addendum)
Patient reports she woke up covered in diarrhea and can't stop reports abd pain as well,. Reports this happened last month and was admitted for 2 daYS. hX OF cDIFF psoitive in sept

## 2022-02-07 ENCOUNTER — Telehealth (HOSPITAL_COMMUNITY): Payer: Self-pay | Admitting: Pharmacy Technician

## 2022-02-07 ENCOUNTER — Inpatient Hospital Stay (HOSPITAL_COMMUNITY): Payer: Medicare Other

## 2022-02-07 ENCOUNTER — Other Ambulatory Visit (HOSPITAL_COMMUNITY): Payer: Self-pay

## 2022-02-07 DIAGNOSIS — I480 Paroxysmal atrial fibrillation: Secondary | ICD-10-CM | POA: Diagnosis not present

## 2022-02-07 DIAGNOSIS — A09 Infectious gastroenteritis and colitis, unspecified: Secondary | ICD-10-CM | POA: Diagnosis not present

## 2022-02-07 DIAGNOSIS — Z85828 Personal history of other malignant neoplasm of skin: Secondary | ICD-10-CM | POA: Diagnosis not present

## 2022-02-07 DIAGNOSIS — Z79899 Other long term (current) drug therapy: Secondary | ICD-10-CM | POA: Diagnosis not present

## 2022-02-07 DIAGNOSIS — Z800 Family history of malignant neoplasm of digestive organs: Secondary | ICD-10-CM | POA: Diagnosis not present

## 2022-02-07 DIAGNOSIS — Z681 Body mass index (BMI) 19 or less, adult: Secondary | ICD-10-CM | POA: Diagnosis not present

## 2022-02-07 DIAGNOSIS — F03A3 Unspecified dementia, mild, with mood disturbance: Secondary | ICD-10-CM | POA: Diagnosis present

## 2022-02-07 DIAGNOSIS — E860 Dehydration: Secondary | ICD-10-CM | POA: Diagnosis present

## 2022-02-07 DIAGNOSIS — F03A4 Unspecified dementia, mild, with anxiety: Secondary | ICD-10-CM | POA: Diagnosis present

## 2022-02-07 DIAGNOSIS — Z9049 Acquired absence of other specified parts of digestive tract: Secondary | ICD-10-CM | POA: Diagnosis not present

## 2022-02-07 DIAGNOSIS — J479 Bronchiectasis, uncomplicated: Secondary | ICD-10-CM | POA: Diagnosis present

## 2022-02-07 DIAGNOSIS — F32A Depression, unspecified: Secondary | ICD-10-CM | POA: Diagnosis present

## 2022-02-07 DIAGNOSIS — E872 Acidosis, unspecified: Secondary | ICD-10-CM | POA: Diagnosis present

## 2022-02-07 DIAGNOSIS — Z8673 Personal history of transient ischemic attack (TIA), and cerebral infarction without residual deficits: Secondary | ICD-10-CM | POA: Diagnosis not present

## 2022-02-07 DIAGNOSIS — Z882 Allergy status to sulfonamides status: Secondary | ICD-10-CM | POA: Diagnosis not present

## 2022-02-07 DIAGNOSIS — R5381 Other malaise: Secondary | ICD-10-CM | POA: Diagnosis present

## 2022-02-07 DIAGNOSIS — R197 Diarrhea, unspecified: Secondary | ICD-10-CM | POA: Diagnosis present

## 2022-02-07 DIAGNOSIS — R109 Unspecified abdominal pain: Secondary | ICD-10-CM | POA: Diagnosis not present

## 2022-02-07 DIAGNOSIS — K5289 Other specified noninfective gastroenteritis and colitis: Secondary | ICD-10-CM | POA: Diagnosis present

## 2022-02-07 DIAGNOSIS — Z888 Allergy status to other drugs, medicaments and biological substances status: Secondary | ICD-10-CM | POA: Diagnosis not present

## 2022-02-07 DIAGNOSIS — E43 Unspecified severe protein-calorie malnutrition: Secondary | ICD-10-CM | POA: Diagnosis present

## 2022-02-07 DIAGNOSIS — A0472 Enterocolitis due to Clostridium difficile, not specified as recurrent: Secondary | ICD-10-CM

## 2022-02-07 DIAGNOSIS — Z8619 Personal history of other infectious and parasitic diseases: Secondary | ICD-10-CM | POA: Diagnosis not present

## 2022-02-07 DIAGNOSIS — E861 Hypovolemia: Secondary | ICD-10-CM | POA: Diagnosis present

## 2022-02-07 DIAGNOSIS — E871 Hypo-osmolality and hyponatremia: Secondary | ICD-10-CM | POA: Diagnosis present

## 2022-02-07 DIAGNOSIS — E876 Hypokalemia: Secondary | ICD-10-CM | POA: Diagnosis not present

## 2022-02-07 LAB — COMPREHENSIVE METABOLIC PANEL
ALT: 20 U/L (ref 0–44)
AST: 21 U/L (ref 15–41)
Albumin: 2.8 g/dL — ABNORMAL LOW (ref 3.5–5.0)
Alkaline Phosphatase: 83 U/L (ref 38–126)
Anion gap: 10 (ref 5–15)
BUN: 23 mg/dL (ref 8–23)
CO2: 20 mmol/L — ABNORMAL LOW (ref 22–32)
Calcium: 8.5 mg/dL — ABNORMAL LOW (ref 8.9–10.3)
Chloride: 103 mmol/L (ref 98–111)
Creatinine, Ser: 0.64 mg/dL (ref 0.44–1.00)
GFR, Estimated: >60 mL/min (ref >60)
Glucose, Bld: 114 mg/dL — ABNORMAL HIGH (ref 70–99)
Potassium: 3.5 mmol/L (ref 3.5–5.1)
Sodium: 133 mmol/L — ABNORMAL LOW (ref 135–145)
Total Bilirubin: 0.8 mg/dL (ref 0.3–1.2)
Total Protein: 5.4 g/dL — ABNORMAL LOW (ref 6.5–8.1)

## 2022-02-07 LAB — CBC WITH DIFFERENTIAL/PLATELET
Abs Immature Granulocytes: 0.02 10*3/uL (ref 0.00–0.07)
Basophils Absolute: 0 10*3/uL (ref 0.0–0.1)
Basophils Relative: 0 %
Eosinophils Absolute: 0 10*3/uL (ref 0.0–0.5)
Eosinophils Relative: 0 %
HCT: 30.9 % — ABNORMAL LOW (ref 36.0–46.0)
Hemoglobin: 10.6 g/dL — ABNORMAL LOW (ref 12.0–15.0)
Immature Granulocytes: 0 %
Lymphocytes Relative: 6 %
Lymphs Abs: 0.3 10*3/uL — ABNORMAL LOW (ref 0.7–4.0)
MCH: 31.9 pg (ref 26.0–34.0)
MCHC: 34.3 g/dL (ref 30.0–36.0)
MCV: 93.1 fL (ref 80.0–100.0)
Monocytes Absolute: 0.4 10*3/uL (ref 0.1–1.0)
Monocytes Relative: 9 %
Neutro Abs: 3.9 10*3/uL (ref 1.7–7.7)
Neutrophils Relative %: 85 %
Platelets: 205 10*3/uL (ref 150–400)
RBC: 3.32 MIL/uL — ABNORMAL LOW (ref 3.87–5.11)
RDW: 14.2 % (ref 11.5–15.5)
WBC: 4.7 10*3/uL (ref 4.0–10.5)
nRBC: 0 % (ref 0.0–0.2)

## 2022-02-07 LAB — MAGNESIUM: Magnesium: 1.6 mg/dL — ABNORMAL LOW (ref 1.7–2.4)

## 2022-02-07 LAB — PHOSPHORUS: Phosphorus: 3.1 mg/dL (ref 2.5–4.6)

## 2022-02-07 MED ORDER — PROCHLORPERAZINE EDISYLATE 10 MG/2ML IJ SOLN
5.0000 mg | Freq: Four times a day (QID) | INTRAMUSCULAR | Status: DC | PRN
  Administered 2022-02-11: 5 mg via INTRAVENOUS
  Filled 2022-02-07: qty 2

## 2022-02-07 MED ORDER — MUPIROCIN 2 % EX OINT
1.0000 | TOPICAL_OINTMENT | Freq: Every day | CUTANEOUS | Status: DC
  Administered 2022-02-07 – 2022-02-11 (×5): 1 via TOPICAL
  Filled 2022-02-07 (×2): qty 22

## 2022-02-07 MED ORDER — METRONIDAZOLE 500 MG/100ML IV SOLN
500.0000 mg | Freq: Two times a day (BID) | INTRAVENOUS | Status: DC
  Administered 2022-02-07: 500 mg via INTRAVENOUS
  Filled 2022-02-07: qty 100

## 2022-02-07 MED ORDER — SODIUM CHLORIDE 0.9 % IV BOLUS
500.0000 mL | Freq: Once | INTRAVENOUS | Status: AC
  Administered 2022-02-07: 500 mL via INTRAVENOUS

## 2022-02-07 MED ORDER — SODIUM CHLORIDE 0.9 % IV SOLN: 1.0000 g | INTRAVENOUS | Status: DC

## 2022-02-07 MED ORDER — MAGNESIUM SULFATE 2 GM/50ML IV SOLN
2.0000 g | Freq: Once | INTRAVENOUS | Status: AC
  Administered 2022-02-07: 2 g via INTRAVENOUS
  Filled 2022-02-07: qty 50

## 2022-02-07 MED ORDER — ACETAMINOPHEN 325 MG PO TABS: 650.0000 mg | ORAL_TABLET | Freq: Four times a day (QID) | ORAL | Status: DC | PRN

## 2022-02-07 MED ORDER — ONDANSETRON HCL 4 MG/2ML IJ SOLN
4.0000 mg | Freq: Once | INTRAMUSCULAR | Status: AC
  Administered 2022-02-07: 4 mg via INTRAVENOUS
  Filled 2022-02-07: qty 2

## 2022-02-07 MED ORDER — POTASSIUM CHLORIDE CRYS ER 20 MEQ PO TBCR
40.0000 meq | EXTENDED_RELEASE_TABLET | Freq: Once | ORAL | Status: AC
  Administered 2022-02-07: 40 meq via ORAL
  Filled 2022-02-07: qty 2

## 2022-02-07 MED ORDER — LACTATED RINGERS IV SOLN: INTRAVENOUS | Status: AC

## 2022-02-07 MED ORDER — MELATONIN 5 MG PO TABS: 5.0000 mg | ORAL_TABLET | Freq: Every evening | ORAL | Status: DC | PRN

## 2022-02-07 MED ORDER — ENOXAPARIN SODIUM 30 MG/0.3ML IJ SOSY
30.0000 mg | PREFILLED_SYRINGE | Freq: Every day | INTRAMUSCULAR | Status: DC
  Administered 2022-02-07 – 2022-02-11 (×5): 30 mg via SUBCUTANEOUS
  Filled 2022-02-07 (×6): qty 0.3

## 2022-02-07 MED ORDER — SACCHAROMYCES BOULARDII 250 MG PO CAPS
250.0000 mg | ORAL_CAPSULE | Freq: Two times a day (BID) | ORAL | Status: DC
  Administered 2022-02-07 – 2022-02-12 (×11): 250 mg via ORAL
  Filled 2022-02-07 (×12): qty 1

## 2022-02-07 MED ORDER — SODIUM CHLORIDE 0.9 % IV SOLN
1.0000 g | Freq: Once | INTRAVENOUS | Status: AC
  Administered 2022-02-07: 1 g via INTRAVENOUS
  Filled 2022-02-07: qty 10

## 2022-02-07 MED ORDER — FIDAXOMICIN 200 MG PO TABS
200.0000 mg | ORAL_TABLET | Freq: Two times a day (BID) | ORAL | Status: DC
  Administered 2022-02-07 – 2022-02-08 (×3): 200 mg via ORAL
  Filled 2022-02-07 (×5): qty 1

## 2022-02-07 NOTE — Telephone Encounter (Signed)
Pharmacy Patient Advocate Encounter  Insurance verification completed.    The patient is insured through [employer]   The patient is currently admitted and ran test claims for the following: Dificid.  Copays and coinsurance results were relayed to Inpatient clinical team. 

## 2022-02-07 NOTE — Consult Note (Signed)
[facility] Consult: 9:28 AM 02/07/2022  LOS: 0 days    Referring Provider:  Irene Pap MD.   Primary Care Physician:  Cassandria Anger, MD Primary Gastroenterologist:  Dr. Lucio Edward no GI office visits since May 2021.   Reason for Consultation:  recurrent C diff   HPI: Julie Montgomery is a 86 y.o. female.  PMH C. difficile infections in 2016, 2018.  Bronchiectasis.  Nonmelanoma skin cancer. PAF.  Anxiety/depression.  Mild dementia.  Surgeries include appendectomy, right breast lumpectomy, cataract surgery, C-section x1, cholecystectomy, Mohs dermatologic surgery, partial hysterectomy, rotator cuff repair, skin graft following resection of skin cancer.  03/2019 colonoscopy.  For weight loss, altered bowel habits.  Sessile, 7 mm polyp n(tubular adenoma, no HGD) removed from ascending colon.  Multiple small mast left-sided diverticula with associated spasm.  Hypertrophied anal papilla. 03/2019 EGD.  For epigastric pain, early satiety, weight loss.  Patchy mild inflammation with erosions/erythema/granularity at the antrum.  Small HH.  Otherwise normal stomach and examined duodenum. Path: Chronic active gastritis with H. pylori.  Was treated with bismuth/metronidazole/doxycycline/Protonix for 2 weeks (no fup testing in chart)  Admission 8/15- 12/18/21 w C diff.  PCR positive, antigen positive, toxin negative.  Recent course Keflex for possible early cellulitis at site of recent removal of scalp lesion.  Developed watery diarrhea, abdominal cramping, weakness, poor p.o. intake, severe protein calorie malnutrition.  Treated for volume depletion, mild hyponatremia.  CT demonstrated rectosigmoid inflammation and retained stool within redundant colon.  C. difficile was treated with Dificid resulting in clinical  improvement, completed 10 d course of Dificid as outpatient.  Admission 9/14-9/16/2023 with recurrent diarrhea. C diff Antigen positive, toxin negative, PCR positive.  CT demonstrated progression to diffuse colonic inflammation.  Infectious disease recommended another 10-day course of Dificid.  By the time of discharge diarrhea head improved "tremendously", she was tolerating solid food.  Discharged to complete the Dificid and prescribed Saccharomyces Boulardii.    After the initial treatment in August when the diarrhea resolved she was having large, soft, formed stool.  After the diarrhea resolved in September she was having smaller, soft stools.  Had never seen blood.  No complaint of abdominal pain, cramping.  No nausea or vomiting.  Says she is eating well.  However her dentures do not fit well and she is in the process of getting new dentures so she has to cut her food up into very small pieces.  Also describes herself as always tiny and having been a sickly child.  Despite these bouts with diarrhea, her weight has been stable at around 87 pounds for several years.   This current recurrence of diarrhea has been present for a little over a week.  She is having watery stools constantly.  Some of them are larger some smaller volume.  12/12/2021 CTAP w contrast: Submucosal edema, mucosal enhancement in the rectum.  No bowel obstruction. 12/24/2021 CT angio chest/ab/pelvis.  No PE.  Bilateral pleural effusions.  Regression of rectosigmoid inflammation compared with 12/12/2021 and no discrete large bowel inflammation.  Retained stool  throughout redundant colon.  No bowel obstruction.  Aortic atherosclerosis 01/11/2022 CTAP w contrast: New, diffuse colonic inflammation.  Small FF in pelvis.  Small bilateral pulmonary nodules, likely infectious/inflammatory.  Advanced lower lumbar facet arthrosis.  Presented back to the hospital yesterday with recurrent watery, nonbloody diarrhea, anorexia, diminished p.o.  intake.  Screening C. difficile studies not yet collected. WBCs normal.  Hb 12. Sodium 130.  BUN/creatinine 24/0.5, GFR greater than 60. LFTs normal.  Albumin 3.5 U/A: Cloudy, large leukocytes, greater than 50 WBCs, many bacteria, 11-20 squamous epithelial cells  Lives at home with her husband.  He has not had any GI illness.  Past Medical History:  Diagnosis Date   Adenomatous polyp of colon 2020   Allergy    Anxiety    Basal cell carcinoma (BCC)    Cataract    Depression    Helicobacter pylori gastritis    Osteopenia    Pneumonia    hx of 03/2008   Stroke [facility])    TIA long time ago    Past Surgical History:  Procedure Laterality Date   APPENDECTOMY     BREAST LUMPECTOMY     right   CATARACT EXTRACTION W/ INTRAOCULAR LENS  IMPLANT, BILATERAL Bilateral    CESAREAN SECTION     CHOLECYSTECTOMY     MOHS SURGERY     PARTIAL HYSTERECTOMY     ROTATOR CUFF REPAIR  2012   SKIN GRAFT     teeth remmoved      Prior to Admission medications   Medication Sig Start Date End Date Taking? Authorizing Provider  citalopram (CELEXA) 10 MG tablet Take 1 tablet (10 mg total) by mouth daily. 01/26/22 01/26/23  Biagio Borg, MD  mupirocin ointment (BACTROBAN) 2 % Apply 1 Application topically daily. 01/02/22   [provider]  Probiotic Product (ALIGN) 4 MG CAPS Take 1 capsule (4 mg total) by mouth daily. 12/26/21   Plotnikov, Evie Lacks, MD    Scheduled Meds:  enoxaparin (LOVENOX) injection  30 mg Subcutaneous Daily   fidaxomicin  200 mg Oral BID   mupirocin ointment  1 Application Topical Daily   saccharomyces boulardii  250 mg Oral BID   Infusions:  cefTRIAXone (ROCEPHIN)  IV     lactated ringers 75 mL/hr at 02/07/22 0351   PRN Meds: acetaminophen, melatonin, prochlorperazine   Allergies as of 02/06/2022 - Review Complete 02/06/2022  Allergen Reaction Noted   Lovastatin Other (See Comments)    Mometasone furo-formoterol fum Other (See Comments) 12/08/2010   Prozac  [fluoxetine hcl] Other (See Comments) 12/13/2021   Sulfa antibiotics Other (See Comments) 03/01/2012    Family History  Problem Relation Age of Onset   Diabetes Sister    Cancer Sister        lung ca   Coronary artery disease Brother    Dementia Brother    Cancer Brother        lung ca   Stomach cancer Paternal Grandmother    Colon cancer Son    Breast cancer Neg Hx    Esophageal cancer Neg Hx    Rectal cancer Neg Hx     Social History   Socioeconomic History   Marital status: Married    Spouse name: Not on file   Number of children: 4   Years of education: Not on file   Highest education level: Not on file  Occupational History   Occupation: Oceanographer  Tobacco Use   Smoking status: Never   Smokeless tobacco:  Never  Vaping Use   Vaping Use: Never used  Substance and Sexual Activity   Alcohol use: No   Drug use: No   Sexual activity: Not Currently  Other Topics Concern   Not on file  Social History Narrative   Married, Retired, she was looking after her GGD, now in court battle   Social Determinants of Health   Financial Resource Strain: Low Risk  (02/27/2021)   Overall Financial Resource Strain (CARDIA)    Difficulty of Paying Living Expenses: Not hard at all  Food Insecurity: No Food Insecurity (01/12/2022)   Hunger Vital Sign    Worried About Running Out of Food in the Last Year: Never true    [place] in the Last Year: Never true  Transportation Needs: No Transportation Needs (01/12/2022)   PRAPARE - Hydrologist (Medical): No    Lack of Transportation (Non-Medical): No  Physical Activity: Inactive (02/27/2021)   Exercise Vital Sign    Days of Exercise per Week: 0 days    Minutes of Exercise per Session: 0 min  Stress: Stress Concern Present (02/27/2021)   [place]    Feeling of Stress : Very much  Social Connections: Socially Integrated  (02/27/2021)   Social Connection and Isolation Panel [NHANES]    Frequency of Communication with Friends and Family: More than three times a week    Frequency of Social Gatherings with Friends and Family: More than three times a week    Attends Religious Services: More than 4 times per year    Active Member of [employer] or Organizations: Yes    Attends Archivist Meetings: More than 4 times per year    Marital Status: Married  Human resources officer Violence: Not At Risk (01/12/2022)   Humiliation, Afraid, Rape, and Kick questionnaire    Fear of Current or Ex-Partner: No    Emotionally Abused: No    Physically Abused: No    Sexually Abused: No    REVIEW OF SYSTEMS: Constitutional: Feels worn out. ENT:  No nose bleeds Pulm: No shortness of breath.  No cough. CV:  No palpitations, no LE edema.  No angina GU:  No hematuria, no frequency GI: See HPI. Heme: No unusual bleeding or excessive bruising. Transfusions: None Neuro:  No headaches, no peripheral tingling or numbness.  No syncope, no seizures. Derm:  No itching, no rash or sores.  Endocrine:  No sweats or chills.  No polyuria or dysuria Immunization: Reviewed. Travel:  None beyond local counties in last few months.    PHYSICAL EXAM: Vital signs in last 24 hours: Vitals:   02/07/22 0915 02/07/22 0919  BP: (!) 106/59   Pulse: 80   Resp: (!) 25   Temp:  98 F (36.7 C)  SpO2: 99%    Wt Readings from Last 3 Encounters:  02/06/22 40.4 kg  01/26/22 40.4 kg  01/16/22 41.5 kg    General: Patient is thin, frail appearing but comfortable and alert.  Does not look toxic. Head: No facial asymmetry or swelling no signs of head trauma. Eyes: No scleral icterus, no conjunctival pallor.  Ears: Not hard of hearing Nose: No congestion or discharge Mouth: Edentulous.  Full dentures in place, they do look too big for her.  Mucosa is moist, pink, clear.  Tongue midline. Neck: No JVD, no masses, no thyromegaly. Lungs: Clear  bilaterally.  Healed scar below her left clavicle is from where  they harvested a skin graft. Heart: RRR.  No MRG.  S1, S2 present. Abdomen: Thin, soft.  Active bowel sounds.  Nontender..   Rectal: Did not perform visual or digital exam but she was on the commode and passed brown, watery stool. Musc/Skeltl: Arms and legs are thin. Extremities: No CCE. Neurologic: Fully alert and oriented.  No tremors.  Able to negotiate getting off the bed, onto the bedside commode and back onto the bed with minimal assistance. Skin: No rash, no sores, no suspicious lesions. Nodes: No cervical adenopathy Psych: Calm, pleasant.  Cooperative.  Excellent historian.  Fluid speech.  Intake/Output from previous day: No intake/output data recorded. Intake/Output this shift: No intake/output data recorded.  LAB RESULTS: Recent Labs    02/06/22 1805 02/07/22 0352  WBC 7.0 4.7  HGB 12.0 10.6*  HCT 37.2 30.9*  PLT 248 205   BMET Lab Results  Component Value Date   NA 133 (L) 02/07/2022   NA 130 (L) 02/06/2022   NA 134 (L) 01/26/2022   K 3.5 02/07/2022   K 3.7 02/06/2022   K 4.2 01/26/2022   CL 103 02/07/2022   CL 99 02/06/2022   CL 99 01/26/2022   CO2 20 (L) 02/07/2022   CO2 21 (L) 02/06/2022   CO2 28 01/26/2022   GLUCOSE 114 (H) 02/07/2022   GLUCOSE 103 (H) 02/06/2022   GLUCOSE 103 (H) 01/26/2022   BUN 23 02/07/2022   BUN 24 (H) 02/06/2022   BUN 22 01/26/2022   CREATININE 0.64 02/07/2022   CREATININE 0.59 02/06/2022   CREATININE 0.65 01/26/2022   CALCIUM 8.5 (L) 02/07/2022   CALCIUM 9.2 02/06/2022   CALCIUM 9.6 01/26/2022   LFT Recent Labs    02/06/22 1805 02/07/22 0352  PROT 6.6 5.4*  ALBUMIN 3.5 2.8*  AST 23 21  ALT 23 20  ALKPHOS 103 83  BILITOT 0.6 0.8   PT/INR No results found for: "INR", "PROTIME" Hepatitis Panel No results for input(s): "HEPBSAG", "HCVAB", "HEPAIGM", "HEPBIGM" in the last 72 hours. C-Diff No components found for: "CDIFF" Lipase     Component Value  Date/Time   LIPASE 31 02/06/2022 1805    Drugs of Abuse  No results found for: "LABOPIA", "COCAINSCRNUR", "LABBENZ", "AMPHETMU", "THCU", "LABBARB"   RADIOLOGY STUDIES: No results found.    IMPRESSION:     Recurrent diarrhea.  2 recent courses of Dificid for C diff Ag positive, toxin negative, PCR positive C diff.  After these treatments diarrhea improved only to recur which is the situation currently.  CT scans in August and again in September initially showed rectal inflammation but the latter CT showed diffuse colonic inflammation.  Had treatment for C. difficile in 2016 and 2018.    Hyponatremia.    UTI?Marland Kitchen  Unfortunately initiated on Rocephin for possible UTI.  However the urinalysis sample did show significant squamous cells in the edition 2 abundant leukocytes and bacteriuria.    Chronic Eliquis.  PAF.    Normocytic anemia.    Recent dx PCM, current albumin wnl.  Patient reports good p.o. intake.    Adenomatous colon polyp 2020    H pylori gastritis 2020, treated w metronidazole, bismuth, PPI, doxycycline.  No fup testing.  No PPI in place at any of recent admissions.  No reflux or upper GI symptoms.    PLAN:     Await infectious disease opinion which is currently in process.  I agree with current regimen of Dificid as it has provided improvement in the past?  Pursue FMT?   Curious as to what infectious disease will say about treating the "UTI ".  It may be best if urine were to be cultured in the future, probably too late to do this now.   Azucena Freed  02/07/2022, 9:28 AM Phone [phone]

## 2022-02-07 NOTE — ED Provider Notes (Signed)
[facility] EMERGENCY DEPARTMENT Provider Note   CSN: [id number] Arrival date & time: 02/06/22  1736     History  Chief Complaint  Patient presents with   Abdominal Pain   Diarrhea    Julie Montgomery is a 86 y.o. female.  The history is provided by the spouse. The history is limited by the condition of the patient.  Abdominal Pain Pain location:  Generalized Pain quality: cramping   Pain radiates to:  Does not radiate Pain severity:  Severe Onset quality:  Gradual Duration:  1 day Timing:  Constant Progression:  Unchanged Chronicity:  Recurrent Context: not suspicious food intake and not trauma   Context comment:  Known C diff within the past month.  Diarrhea started again 24 hours ago.  Now with severe nausea Relieved by:  Nothing Worsened by:  Nothing Ineffective treatments:  None tried Associated symptoms: diarrhea and nausea   Associated symptoms: no fever   Diarrhea Quality:  Copious and malodorous Severity:  Severe Onset quality:  Gradual Duration:  1 day Timing:  Constant Progression:  Unchanged Relieved by:  Nothing Worsened by:  Nothing Associated symptoms: abdominal pain   Associated symptoms: no fever   Risk factors: no recent antibiotic use        Home Medications Prior to Admission medications   Medication Sig Start Date End Date Taking? Authorizing Provider  citalopram (CELEXA) 10 MG tablet Take 1 tablet (10 mg total) by mouth daily. 01/26/22 01/26/23  Biagio Borg, MD  mupirocin ointment (BACTROBAN) 2 % Apply 1 Application topically daily. 01/02/22   [provider]  Probiotic Product (ALIGN) 4 MG CAPS Take 1 capsule (4 mg total) by mouth daily. 12/26/21   Plotnikov, Evie Lacks, MD      Allergies    Lovastatin, Mometasone furo-formoterol fum, Prozac [fluoxetine hcl], and Sulfa antibiotics    Review of Systems   Review of Systems  Unable to perform ROS: Acuity of condition  Constitutional:  Negative for fever.   Eyes:  Negative for redness.  Respiratory:  Negative for wheezing and stridor.   Gastrointestinal:  Positive for abdominal pain, diarrhea and nausea.    Physical Exam Updated Vital Signs BP (!) 109/59   Pulse 77   Temp 98.6 F (37 C) (Oral)   Resp (!) 23   Ht '5\' 2"'$  (1.575 m)   Wt 40.4 kg   SpO2 91%   BMI 16.28 kg/m  Physical Exam Vitals and nursing note reviewed. Exam conducted with a chaperone present.  Constitutional:      General: She is not in acute distress.    Appearance: She is well-developed.  HENT:     Head: Normocephalic and atraumatic.     Nose: Nose normal.  Eyes:     Pupils: Pupils are equal, round, and reactive to light.  Cardiovascular:     Rate and Rhythm: Normal rate and regular rhythm.     Pulses: Normal pulses.     Heart sounds: Normal heart sounds.  Pulmonary:     Effort: Pulmonary effort is normal. No respiratory distress.     Breath sounds: Normal breath sounds.  Abdominal:     General: There is no distension.     Palpations: Abdomen is soft.     Tenderness: There is no abdominal tenderness. There is no guarding or rebound.     Comments: Hyperactive BS  Genitourinary:    Vagina: No vaginal discharge.  Musculoskeletal:        General:  Normal range of motion.     Cervical back: Neck supple.  Skin:    General: Skin is warm and dry.     Capillary Refill: Capillary refill takes less than 2 seconds.     Findings: No erythema or rash.  Neurological:     General: No focal deficit present.     Deep Tendon Reflexes: Reflexes normal.  Psychiatric:        Mood and Affect: Mood normal.     ED Results / Procedures / Treatments   Labs (all labs ordered are listed, but only abnormal results are displayed) Results for orders placed or performed during the hospital encounter of 02/06/22  CBC with Differential  Result Value Ref Range   WBC 7.0 4.0 - 10.5 K/uL   RBC 3.90 3.87 - 5.11 MIL/uL   Hemoglobin 12.0 12.0 - 15.0 g/dL   HCT 37.2 36.0 - 46.0 %    MCV 95.4 80.0 - 100.0 fL   MCH 30.8 26.0 - 34.0 pg   MCHC 32.3 30.0 - 36.0 g/dL   RDW 14.2 11.5 - 15.5 %   Platelets 248 150 - 400 K/uL   nRBC 0.0 0.0 - 0.2 %   Neutrophils Relative % 87 %   Neutro Abs 6.1 1.7 - 7.7 K/uL   Lymphocytes Relative 7 %   Lymphs Abs 0.5 (L) 0.7 - 4.0 K/uL   Monocytes Relative 6 %   Monocytes Absolute 0.4 0.1 - 1.0 K/uL   Eosinophils Relative 0 %   Eosinophils Absolute 0.0 0.0 - 0.5 K/uL   Basophils Relative 0 %   Basophils Absolute 0.0 0.0 - 0.1 K/uL   Immature Granulocytes 0 %   Abs Immature Granulocytes 0.01 0.00 - 0.07 K/uL  Comprehensive metabolic panel  Result Value Ref Range   Sodium 130 (L) 135 - 145 mmol/L   Potassium 3.7 3.5 - 5.1 mmol/L   Chloride 99 98 - 111 mmol/L   CO2 21 (L) 22 - 32 mmol/L   Glucose, Bld 103 (H) 70 - 99 mg/dL   BUN 24 (H) 8 - 23 mg/dL   Creatinine, Ser 0.59 0.44 - 1.00 mg/dL   Calcium 9.2 8.9 - 10.3 mg/dL   Total Protein 6.6 6.5 - 8.1 g/dL   Albumin 3.5 3.5 - 5.0 g/dL   AST 23 15 - 41 U/L   ALT 23 0 - 44 U/L   Alkaline Phosphatase 103 38 - 126 U/L   Total Bilirubin 0.6 0.3 - 1.2 mg/dL   GFR, Estimated >60 >60 mL/min   Anion gap 10 5 - 15  Lipase, blood  Result Value Ref Range   Lipase 31 11 - 51 U/L  Urinalysis, Routine w reflex microscopic Urine, Clean Catch  Result Value Ref Range   Color, Urine AMBER (A) YELLOW   APPearance CLOUDY (A) CLEAR   Specific Gravity, Urine 1.025 1.005 - 1.030   pH 5.0 5.0 - 8.0   Glucose, UA NEGATIVE NEGATIVE mg/dL   Hgb urine dipstick MODERATE (A) NEGATIVE   Bilirubin Urine NEGATIVE NEGATIVE   Ketones, ur 5 (A) NEGATIVE mg/dL   Protein, ur 30 (A) NEGATIVE mg/dL   Nitrite NEGATIVE NEGATIVE   Leukocytes,Ua LARGE (A) NEGATIVE   RBC / HPF 21-50 0 - 5 RBC/hpf   WBC, UA >50 (H) 0 - 5 WBC/hpf   Bacteria, UA MANY (A) NONE SEEN   Squamous Epithelial / LPF 11-20 0 - 5   Mucus PRESENT    DG Chest 2 View  Result Date: 01/29/2022 CLINICAL DATA:  Dyspnea.  The history of  pneumonia. EXAM: CHEST - 2 VIEW COMPARISON:  12/24/2021, chest radiographs and chest CT. FINDINGS: Cardiac silhouette is normal in size. No mediastinal or hilar masses. No evidence of adenopathy. Lungs are hyperexpanded. Prominent bronchovascular and interstitial markings. No evidence of pneumonia or pulmonary edema. No pleural effusion or pneumothorax. Skeletal structures are intact. IMPRESSION: 1. No acute cardiopulmonary disease. Electronically Signed   By: Lajean Manes M.D.   On: 01/29/2022 13:51   CT Abdomen Pelvis W Contrast  Result Date: 01/11/2022 CLINICAL DATA:  Abdominal pain, acute, nonlocalized. C diff, assess for degree of colitis. EXAM: CT ABDOMEN AND PELVIS WITH CONTRAST TECHNIQUE: Multidetector CT imaging of the abdomen and pelvis was performed using the standard protocol following bolus administration of intravenous contrast. RADIATION DOSE REDUCTION: This exam was performed according to the departmental dose-optimization program which includes automated exposure control, adjustment of the mA and/or kV according to patient size and/or use of iterative reconstruction technique. CONTRAST:  44m OMNIPAQUE IOHEXOL 350 MG/ML SOLN COMPARISON:  CT abdomen and pelvis 12/24/2021 FINDINGS: Lower chest: Small, partly clustered nodular densities located predominantly peripherally in both lung bases. Scattered mucous plugging in the airways. Resolved pleural effusions. Hepatobiliary: No focal liver abnormality is seen. Status post cholecystectomy. No biliary dilatation. Pancreas: Unremarkable. Spleen: Unremarkable. Adrenals/Urinary Tract: Unremarkable adrenal glands. 1.5 cm left renal cyst for which no follow-up imaging is recommended. No renal calculi or hydronephrosis. Unremarkable bladder. Stomach/Bowel: The stomach is unremarkable. There is no evidence of bowel obstruction. There is new wall thickening diffusely involving the colon which appears worst in the right colon. Gas and scattered small fluid  levels are present throughout the colon, and there is surrounding inflammatory stranding diffusely about the colon and rectum. Vascular/Lymphatic: Abdominal aortic atherosclerosis. Unchanged 1.5 cm coarsely calcified splenic artery aneurysm. No enlarged lymph nodes. Reproductive: Status post hysterectomy. No adnexal masses. Other: Small volume pelvic free fluid.  No pneumoperitoneum. Musculoskeletal: No acute osseous abnormality or suspicious osseous lesion. Advanced lower lumbar facet arthrosis. IMPRESSION: 1. New diffuse colonic inflammation consistent with the provided history of C difficile colitis. 2. Small volume pelvic free fluid. 3. Small peripheral nodular densities in both lung bases, likely infectious/inflammatory. 4. Aortic Atherosclerosis (ICD10-I70.0). Electronically Signed   By: ALogan BoresM.D.   On: 01/11/2022 15:51     EKG None  Radiology No results found.  Procedures Procedures    Medications Ordered in ED Medications  enoxaparin (LOVENOX) injection 30 mg (has no administration in time range)  mupirocin ointment (BACTROBAN) 2 % 1 Application (has no administration in time range)  fidaxomicin (DIFICID) tablet 200 mg (has no administration in time range)  saccharomyces boulardii (FLORASTOR) capsule 250 mg (has no administration in time range)  cefTRIAXone (ROCEPHIN) 1 g in sodium chloride 0.9 % 100 mL IVPB (has no administration in time range)  lactated ringers infusion ( Intravenous New Bag/Given 02/07/22 0351)  ondansetron (ZOFRAN) injection 4 mg (4 mg Intravenous Given 02/07/22 0209)  sodium chloride 0.9 % bolus 500 mL (0 mLs Intravenous Stopped 02/07/22 0345)  cefTRIAXone (ROCEPHIN) 1 g in sodium chloride 0.9 % 100 mL IVPB (0 g Intravenous Stopped 02/07/22 0325)    ED Course/ Medical Decision Making/ A&P                           Medical Decision Making Patient with C diff completed a course of therapy for this in September returns  with same   Amount and/or  Complexity of Data Reviewed Independent Historian: spouse    Details: See above  External Data Reviewed: labs and notes.    Details: Previous ED notes and admission notes and stool panel reviewed  Labs: ordered.    Details: All labs reviewed: urine is consistent with UTI.  Lipase is normal 31.  White count is normal 7, hemoglobin normal 12, platelets normal 248k.  Low sodium 130, normal potassium 3.7,  elevated BUN 24, normal creatinine .59  Risk Prescription drug management. Decision regarding hospitalization. Risk Details: Not taking POs at this time.  Will start with rocephin for UTI and flagyl IV for C diff, can change therapy when nausea resolves.      Final Clinical Impression(s) / ED Diagnoses Final diagnoses:  Diarrhea of infectious origin  Acute cystitis without hematuria  Dehydration   The patient appears reasonably stabilized for admission considering the current resources, flow, and capabilities available in the ED at this time, and I doubt any other [facility] requiring further screening and/or treatment in the ED prior to admission.  Rx / DC Orders ED Discharge Orders     None         Toniyah Dilmore, MD 02/07/22 0410

## 2022-02-07 NOTE — Progress Notes (Addendum)
No charge note  Patient seen and examined this morning, she is known to me from back in August when she was admitted with severe diarrhea, dehydration, electrolyte imbalances.  She tested positive for C. difficile, afterwards she was placed on fidaxomicin with significant improvement in her symptoms and discharged home.  She was admitted again in September with recurrent diarrhea, and a CT scan of the abdomen and pelvis showing diffuse colonic inflammation consistent with C. difficile infection.  She improved with Dificid and sent home.  She is back in the hospital with 20+ watery bowel movements over the last several days, mild electrolyte imbalances with hypomagnesemia.  Due to recurrent symptoms ID consulted.  Appreciate input.  No need for Ceftriaxone and she has no UTI symptoms.   Scheduled Meds:  enoxaparin (LOVENOX) injection  30 mg Subcutaneous Daily   fidaxomicin  200 mg Oral BID   mupirocin ointment  1 Application Topical Daily   saccharomyces boulardii  250 mg Oral BID   Continuous Infusions:  cefTRIAXone (ROCEPHIN)  IV     lactated ringers 75 mL/hr at 02/07/22 0351   PRN Meds:.acetaminophen, melatonin, prochlorperazine  Shawndell Varas M. Cruzita Lederer, MD, PhD [facility]  Between 7 am - 7 pm you can contact me via Amion (for emergencies) or [place] (non urgent matters).  I am not available 7 pm - 7 am, please contact night coverage MD/APP via Amion

## 2022-02-07 NOTE — TOC Benefit Eligibility Note (Addendum)
Patient Teacher, English as a foreign language completed.    The patient is currently admitted and upon discharge could be taking Dificid 200 mg tablets.  The current 10 day co-pay is $1,345.67 does qualify for Copay card to make it $50.01.   The patient is currently admitted and upon discharge could be taking Zinplave 1000 mg/ 40 ml.  The current 10 day co-pay is $1,813.79   The patient is insured through [employer], [place] Patient Advocate Specialist [facility] Patient Advocate Team Direct Number: [phone]  Fax: [phone]

## 2022-02-07 NOTE — H&P (Signed)
History and Physical  Julie Montgomery MVE:[id number] DOB: May 09, 1935 DOA: 02/06/2022  Referring physician: Dr. Randal Buba, EDP  PCP: Cassandria Anger, MD  Outpatient Specialists: None Patient coming from: Home Chief Complaint: Diarrhea   HPI: Julie Montgomery is a 86 y.o. female with medical history significant for mild dementia, not on any prescribed oral medication per her husband at bedside, recurrent C. difficile infection x6, who presented to [facility] ED from home due to progressively worsening diarrhea associated with abdominal cramping with defecation.  Endorses she woke up covered in watery stools with incontinence, similar to prior C. difficile infection for which she was admitted last month.  Denies nausea or vomiting.  Reports decrease in appetite due to diarrhea.  In the ED, her diarrhea persisted.  Lab studies were remarkable for electrolytes abnormalities.  UA positive for pyuria.  Due to concern for UTI and recurrent C. difficile infection with diarrhea illness, she was started on Rocephin and IV Flagyl.  EDP requested admission for further management.  ED Course: Tmax 99.4.  BP 112/55, pulse 81, respiration rate 18, O2 saturation 94% on room air.  Review of Systems: Review of systems as noted in the HPI. All other systems reviewed and are negative.   Past Medical History:  Diagnosis Date   Adenomatous polyp of colon 2020   Allergy    Anxiety    Basal cell carcinoma (BCC)    Cataract    Depression    Helicobacter pylori gastritis    Osteopenia    Pneumonia    hx of 03/2008   Stroke [facility])    TIA long time ago   Past Surgical History:  Procedure Laterality Date   APPENDECTOMY     BREAST LUMPECTOMY     right   CATARACT EXTRACTION W/ INTRAOCULAR LENS  IMPLANT, BILATERAL Bilateral    CESAREAN SECTION     CHOLECYSTECTOMY     MOHS SURGERY     PARTIAL HYSTERECTOMY     ROTATOR CUFF REPAIR  2012   SKIN GRAFT     teeth remmoved      Social History:  reports that she  has never smoked. She has never used smokeless tobacco. She reports that she does not drink alcohol and does not use drugs.   Allergies  Allergen Reactions   Lovastatin Other (See Comments)    Unknown reaction   Mometasone Furo-Formoterol Fum Other (See Comments)    Loss of appetite, laryngitis    Prozac [Fluoxetine Hcl] Other (See Comments)    Jumpy   Sulfa Antibiotics Other (See Comments)    Unknown reaction    Family History  Problem Relation Age of Onset   Diabetes Sister    Cancer Sister        lung ca   Coronary artery disease Brother    Dementia Brother    Cancer Brother        lung ca   Stomach cancer Paternal Grandmother    Colon cancer Son    Breast cancer Neg Hx    Esophageal cancer Neg Hx    Rectal cancer Neg Hx       Prior to Admission medications   Medication Sig Start Date End Date Taking? Authorizing Provider  citalopram (CELEXA) 10 MG tablet Take 1 tablet (10 mg total) by mouth daily. 01/26/22 01/26/23  Biagio Borg, MD  mupirocin ointment (BACTROBAN) 2 % Apply 1 Application topically daily. 01/02/22   [provider]  Probiotic Product (ALIGN) 4 MG CAPS Take  1 capsule (4 mg total) by mouth daily. 12/26/21   Plotnikov, Evie Lacks, MD    Physical Exam: BP (!) 109/59   Pulse 77   Temp 98.6 F (37 C) (Oral)   Resp (!) 23   Ht '5\' 2"'$  (1.575 m)   Wt 40.4 kg   SpO2 91%   BMI 16.28 kg/m   General: 86 y.o. year-old female Frail appearing in no acute distress.  Alert and pleasant. Cardiovascular: Regular rate and rhythm with no rubs or gallops.  No thyromegaly or JVD noted.  No lower extremity edema. 2/4 pulses in all 4 extremities. Respiratory: Clear to auscultation with no wheezes or rales. Good inspiratory effort. Abdomen: Soft nontender nondistended with normal bowel sounds x4 quadrants. Muskuloskeletal: No cyanosis, clubbing or edema noted bilaterally Neuro: CN II-XII intact, strength, sensation, reflexes Skin: No ulcerative lesions noted or  rashes Psychiatry: Judgement and insight appear normal. Mood is appropriate for condition and setting          Labs on Admission:  Basic Metabolic Panel: Recent Labs  Lab 02/06/22 1805  NA 130*  K 3.7  CL 99  CO2 21*  GLUCOSE 103*  BUN 24*  CREATININE 0.59  CALCIUM 9.2   Liver Function Tests: Recent Labs  Lab 02/06/22 1805  AST 23  ALT 23  ALKPHOS 103  BILITOT 0.6  PROT 6.6  ALBUMIN 3.5   Recent Labs  Lab 02/06/22 1805  LIPASE 31   No results for input(s): "AMMONIA" in the last 168 hours. CBC: Recent Labs  Lab 02/06/22 1805  WBC 7.0  NEUTROABS 6.1  HGB 12.0  HCT 37.2  MCV 95.4  PLT 248   Cardiac Enzymes: No results for input(s): "CKTOTAL", "CKMB", "CKMBINDEX", "TROPONINI" in the last 168 hours.  BNP (last 3 results) Recent Labs    12/24/21 0323  BNP 276.8*    ProBNP (last 3 results) Recent Labs    01/26/22 1447  PROBNP 68.0    CBG: No results for input(s): "GLUCAP" in the last 168 hours.  Radiological Exams on Admission: No results found.  EKG: I independently viewed the EKG done and my findings are as followed: None available at the time of the visit.  Assessment/Plan Present on Admission:  Clostridium difficile diarrhea  Principal Problem:   Clostridium difficile diarrhea  Suspected recurrent C. difficile infection diarrhea, POA Recurrent C. difficile infection x 6 May benefit from fecal microbiota transplant. Infectious disease and GI consulted Start Dificid 200 mg twice daily along with Florastor 250 mg twice daily. Follow C. difficile PCR and acute GI panel with PCR  Severe protein calorie malnutrition BMI 16 Severe muscle mass loss Regular diet Dietitian consulted  Mild hypovolemic hyponatremia Serum sodium 130 IV fluid hydration LR at 75 cc/h x 2 days Last TSH 3.61, within the normal range, 01/26/22 Repeat chemistry panel in the morning  Non anion gap metabolic acidosis Serum bicarb 21 anion gap 10 LR at 75 cc/h  x 2 days.  Physical debility Out of bed to chair with assistance every shift Fall precautions    DVT prophylaxis: Subcu Lovenox daily  Code Status: Full code  Family Communication: Updated the patient's husband at bedside.  Disposition Plan: Admitted to telemetry medical unit  Consults called: GI, infectious disease  Admission status: Inpatient status.   Status is: Inpatient The patient requires at least 2 midnights for further evaluation and treatment of present condition.   Kayleen Memos MD [facility] Pager [phone]  If 7PM-7AM, please contact  night-coverage [email or web address] Password [email or web address]  02/07/2022, 3:31 AM

## 2022-02-07 NOTE — ED Notes (Signed)
ED TO INPATIENT HANDOFF REPORT  ED Nurse Name and Phone #: Montanna Mcbain RN [phone]  S Name/Age/Gender Julie Montgomery 86 y.o. female Room/Bed: 038C/038C  Code Status   Code Status: Full Code  Home/SNF/Other Home Patient oriented to: self, place, time, and situation Is this baseline? Yes   Triage Complete: Triage complete  Chief Complaint Clostridium difficile diarrhea [A04.72]  Triage Note Patient reports she woke up covered in diarrhea and can't stop reports abd pain as well,. Reports this happened last month and was admitted for 2 daYS. hX OF cDIFF psoitive in sept   Allergies Allergies  Allergen Reactions   Lovastatin Other (See Comments)    Unknown reaction   Mometasone Furo-Formoterol Fum Other (See Comments)    Loss of appetite, laryngitis    Prozac [Fluoxetine Hcl] Other (See Comments)    Jumpy   Sulfa Antibiotics Other (See Comments)    Unknown reaction    Level of Care/Admitting Diagnosis ED Disposition     ED Disposition  Admit   Condition  --   Comment  Hospital Area: [facility] [[id number]]  Level of Care: Telemetry Medical [104]  May admit patient to [facility] or [facility] if equivalent level of care is available:: Yes  Covid Evaluation: Asymptomatic - no recent exposure (last 10 days) testing not required  Diagnosis: Clostridium difficile diarrhea [245809]  Admitting Physician: Kayleen Memos [[id number]]  Attending Physician: Kayleen Memos [[id number]]  Certification:: I certify this patient will need inpatient services for at least 2 midnights  Estimated Length of Stay: 2          B Medical/Surgery History Past Medical History:  Diagnosis Date   Adenomatous polyp of colon 2020   Allergy    Anxiety    Basal cell carcinoma (BCC)    Cataract    Depression    Helicobacter pylori gastritis    Osteopenia    Pneumonia    hx of 03/2008   Stroke ([place])    TIA long time ago   Past Surgical History:  Procedure Laterality Date    APPENDECTOMY     BREAST LUMPECTOMY     right   CATARACT EXTRACTION W/ INTRAOCULAR LENS  IMPLANT, BILATERAL Bilateral    CESAREAN [place]  2012   SKIN GRAFT     teeth remmoved       A IV Location/Drains/Wounds Patient Lines/Drains/Airways Status     Active Line/Drains/Airways     Name Placement date Placement time Site Days   Peripheral IV 02/07/22 18 G Right Antecubital 02/07/22  0152  Antecubital  less than 1            Intake/Output Last 24 hours No intake or output data in the 24 hours ending 02/07/22 2049  Labs/Imaging Results for orders placed or performed during the hospital encounter of 02/06/22 (from the past 48 hour(s))  Urinalysis, Routine w reflex microscopic Urine, Clean Catch     Status: Abnormal   Collection Time: 02/06/22  5:36 PM  Result Value Ref Range   Color, Urine AMBER (A) YELLOW    Comment: BIOCHEMICALS MAY BE AFFECTED BY COLOR   APPearance CLOUDY (A) CLEAR   Specific Gravity, Urine 1.025 1.005 - 1.030   pH 5.0 5.0 - 8.0   Glucose, UA NEGATIVE NEGATIVE mg/dL   Hgb urine dipstick MODERATE (A) NEGATIVE  Bilirubin Urine NEGATIVE NEGATIVE   Ketones, ur 5 (A) NEGATIVE mg/dL   Protein, ur 30 (A) NEGATIVE mg/dL   Nitrite NEGATIVE NEGATIVE   Leukocytes,Ua LARGE (A) NEGATIVE   RBC / HPF 21-50 0 - 5 RBC/hpf   WBC, UA >50 (H) 0 - 5 WBC/hpf   Bacteria, UA MANY (A) NONE SEEN   Squamous Epithelial / LPF 11-20 0 - 5   Mucus PRESENT     Comment: Performed at [facility], [place] [address] [place], [place] [address]  CBC with Differential     Status: Abnormal   Collection Time: 02/06/22  6:05 PM  Result Value Ref Range   WBC 7.0 4.0 - 10.5 K/uL   RBC 3.90 3.87 - 5.11 MIL/uL   Hemoglobin 12.0 12.0 - 15.0 g/dL   HCT 37.2 36.0 - 46.0 %   MCV 95.4 80.0 - 100.0 fL   MCH 30.8 26.0 - 34.0 pg   MCHC 32.3 30.0 - 36.0 g/dL   RDW 14.2 11.5 - 15.5 %   Platelets 248 150  - 400 K/uL   nRBC 0.0 0.0 - 0.2 %   Neutrophils Relative % 87 %   Neutro Abs 6.1 1.7 - 7.7 K/uL   Lymphocytes Relative 7 %   Lymphs Abs 0.5 (L) 0.7 - 4.0 K/uL   Monocytes Relative 6 %   Monocytes Absolute 0.4 0.1 - 1.0 K/uL   Eosinophils Relative 0 %   Eosinophils Absolute 0.0 0.0 - 0.5 K/uL   Basophils Relative 0 %   Basophils Absolute 0.0 0.0 - 0.1 K/uL   Immature Granulocytes 0 %   Abs Immature Granulocytes 0.01 0.00 - 0.07 K/uL    Comment: Performed at [facility] [address] [place], [place] [address]  Comprehensive metabolic panel     Status: Abnormal   Collection Time: 02/06/22  6:05 PM  Result Value Ref Range   Sodium 130 (L) 135 - 145 mmol/L   Potassium 3.7 3.5 - 5.1 mmol/L   Chloride 99 98 - 111 mmol/L   CO2 21 (L) 22 - 32 mmol/L   Glucose, Bld 103 (H) 70 - 99 mg/dL    Comment: Glucose reference range applies only to samples taken after fasting for at least 8 hours.   BUN 24 (H) 8 - 23 mg/dL   Creatinine, Ser 0.59 0.44 - 1.00 mg/dL   Calcium 9.2 8.9 - 10.3 mg/dL   Total Protein 6.6 6.5 - 8.1 g/dL   Albumin 3.5 3.5 - 5.0 g/dL   AST 23 15 - 41 U/L   ALT 23 0 - 44 U/L   Alkaline Phosphatase 103 38 - 126 U/L   Total Bilirubin 0.6 0.3 - 1.2 mg/dL   GFR, Estimated >60 >60 mL/min    Comment: (NOTE) Calculated using the CKD-EPI Creatinine Equation (2021)    Anion gap 10 5 - 15    Comment: Performed at [facility] [address], [place], [place] [address]  Lipase, blood     Status: None   Collection Time: 02/06/22  6:05 PM  Result Value Ref Range   Lipase 31 11 - 51 U/L    Comment: Performed at [facility] [address], [place], [place] [address]  CBC with Differential/Platelet     Status: Abnormal   Collection Time: 02/07/22  3:52 AM  Result Value Ref Range   WBC 4.7 4.0 - 10.5 K/uL   RBC 3.32 (L) 3.87 - 5.11 MIL/uL  Hemoglobin 10.6 (L) 12.0 - 15.0 g/dL   HCT 30.9 (L) 36.0 - 46.0 %   MCV 93.1 80.0 - 100.0 fL   MCH 31.9 26.0 - 34.0  pg   MCHC 34.3 30.0 - 36.0 g/dL   RDW 14.2 11.5 - 15.5 %   Platelets 205 150 - 400 K/uL   nRBC 0.0 0.0 - 0.2 %   Neutrophils Relative % 85 %   Neutro Abs 3.9 1.7 - 7.7 K/uL   Lymphocytes Relative 6 %   Lymphs Abs 0.3 (L) 0.7 - 4.0 K/uL   Monocytes Relative 9 %   Monocytes Absolute 0.4 0.1 - 1.0 K/uL   Eosinophils Relative 0 %   Eosinophils Absolute 0.0 0.0 - 0.5 K/uL   Basophils Relative 0 %   Basophils Absolute 0.0 0.0 - 0.1 K/uL   Immature Granulocytes 0 %   Abs Immature Granulocytes 0.02 0.00 - 0.07 K/uL    Comment: Performed at [facility] [address], [place], [place] [address]  Comprehensive metabolic panel     Status: Abnormal   Collection Time: 02/07/22  3:52 AM  Result Value Ref Range   Sodium 133 (L) 135 - 145 mmol/L   Potassium 3.5 3.5 - 5.1 mmol/L   Chloride 103 98 - 111 mmol/L   CO2 20 (L) 22 - 32 mmol/L   Glucose, Bld 114 (H) 70 - 99 mg/dL    Comment: Glucose reference range applies only to samples taken after fasting for at least 8 hours.   BUN 23 8 - 23 mg/dL   Creatinine, Ser 0.64 0.44 - 1.00 mg/dL   Calcium 8.5 (L) 8.9 - 10.3 mg/dL   Total Protein 5.4 (L) 6.5 - 8.1 g/dL   Albumin 2.8 (L) 3.5 - 5.0 g/dL   AST 21 15 - 41 U/L   ALT 20 0 - 44 U/L   Alkaline Phosphatase 83 38 - 126 U/L   Total Bilirubin 0.8 0.3 - 1.2 mg/dL   GFR, Estimated >60 >60 mL/min    Comment: (NOTE) Calculated using the CKD-EPI Creatinine Equation (2021)    Anion gap 10 5 - 15    Comment: Performed at [facility], [place] [address] [place], [place] [address]  Magnesium     Status: Abnormal   Collection Time: 02/07/22  3:52 AM  Result Value Ref Range   Magnesium 1.6 (L) 1.7 - 2.4 mg/dL    Comment: Performed at [facility] [address], [place], [place] [address]  Phosphorus     Status: None   Collection Time: 02/07/22  3:52 AM  Result Value Ref Range   Phosphorus 3.1 2.5 - 4.6 mg/dL    Comment: Performed at [facility] [address]  [place], [place] [address]   DG Abd 2 Views  Result Date: 02/07/2022 CLINICAL DATA:  Abdominal pain. EXAM: ABDOMEN - 2 VIEW COMPARISON:  Acute abdominal series 03/22/2010. Abdominal CT 01/11/2022. FINDINGS: The bowel gas pattern is nonobstructive. There is no free intraperitoneal air or suspicious abdominal calcification. Cholecystectomy clips are noted. There is multifocal costal calcification. Grossly stable peripherally calcified splenic artery aneurysm. IMPRESSION: No evidence of bowel obstruction or other acute process. Stable splenic artery aneurysm. Electronically Signed   By: Richardean Sale M.D.   On: 02/07/2022 17:50    Pending Labs Unresulted Labs (From admission, onward)     Start     Ordered   02/14/22 0500  Creatinine, serum  (enoxaparin (LOVENOX)  CrCl >/= 30 ml/min)  Weekly,   R     Comments: while on enoxaparin therapy    02/07/22 0324   02/08/22 0500  Comprehensive metabolic panel  Tomorrow morning,   R        02/07/22 0945   02/08/22 0500  CBC  Tomorrow morning,   R        02/07/22 0945   02/08/22 0500  Magnesium  Tomorrow morning,   R        02/07/22 0945   02/07/22 1146  Gastrointestinal Panel by PCR , Stool  (Gastrointestinal Panel by PCR, Stool                                                                                                                                                     **Does Not include CLOSTRIDIUM DIFFICILE testing. **If CDIFF testing is needed, place order from the "C Difficile Testing" order set.**)  Once,   R       Question:  Patient immune status  Answer:  Normal   02/07/22 1145   02/07/22 0326  C Difficile Quick Screen w PCR reflex  (C Difficile quick screen w PCR reflex panel )  Once, for 24 hours,   TIMED       References:    CDiff Information Tool   02/07/22 0325   02/07/22 0325  CBC  (enoxaparin (LOVENOX)    CrCl >/= 30 ml/min)  Once,   R       Comments: Baseline for enoxaparin therapy IF NOT ALREADY DRAWN.  Notify MD if PLT < 100 K.     02/07/22 0324            Vitals/Pain Today's Vitals   02/07/22 1815 02/07/22 1900 02/07/22 1931 02/07/22 2015  BP:  124/62    Pulse: 75 80  72  Resp: 19 (!) 23  19  Temp:   98.3 F (36.8 C)   TempSrc:   Oral   SpO2: 94% 95%  93%  Weight:      Height:      PainSc:        Isolation Precautions Enteric precautions (UV disinfection)  Medications Medications  enoxaparin (LOVENOX) injection 30 mg (30 mg Subcutaneous Given 02/07/22 0916)  mupirocin ointment (BACTROBAN) 2 % 1 Application (1 Application Topical Given 02/07/22 0915)  fidaxomicin (DIFICID) tablet 200 mg (200 mg Oral Given 02/07/22 0427)  saccharomyces boulardii (FLORASTOR) capsule 250 mg (250 mg Oral Given 02/07/22 0427)  lactated ringers infusion ( Intravenous Rate/Dose Verify 02/07/22 1303)  acetaminophen (TYLENOL) tablet 650 mg (has no administration in time range)  melatonin tablet 5 mg (has no administration in time range)  prochlorperazine (COMPAZINE) injection 5 mg (has no administration in time range)  ondansetron (ZOFRAN) injection 4 mg (4 mg Intravenous Given 02/07/22 0209)  sodium chloride 0.9 %  bolus 500 mL (0 mLs Intravenous Stopped 02/07/22 0345)  cefTRIAXone (ROCEPHIN) 1 g in sodium chloride 0.9 % 100 mL IVPB (0 g Intravenous Stopped 02/07/22 0325)  magnesium sulfate IVPB 2 g 50 mL (0 g Intravenous Stopped 02/07/22 0740)  potassium chloride SA (KLOR-CON M) CR tablet 40 mEq (40 mEq Oral Given 02/07/22 7289)    Mobility walks Low fall risk   Focused Assessments Cardiac Assessment Handoff:    No results found for: "CKTOTAL", "CKMB", "CKMBINDEX", "TROPONINI" Lab Results  Component Value Date   DDIMER 1.69 (H) 12/24/2021   Does the Patient currently have chest pain? No   , Pulmonary Assessment Handoff:  Lung sounds:   O2 Device: Room Air      R Recommendations: See Admitting Provider Note  Report given to:   Additional Notes:

## 2022-02-07 NOTE — Consult Note (Addendum)
I have seen and examined the patient. I have personally reviewed the clinical findings, laboratory findings, microbiological data and imaging studies. The assessment and treatment plan was discussed with the Nurse Practitioner Janene Madeira. I agree with her/his recommendations except following additions/corrections.  H pylori gastritis s/p quadruple therapy in 03/2019 History of C. difficile in 2016 (treated with 14 days of metronidazole)  as well as August and September 2023 (treated with a 10-day course of fidaxomicin each time , Toxigenic PCR + with little to no toxin production) per epic with also reported h/o C diff in 2018   Diarrhea had not completely improved after last C diff episode in Sept. Used to have large 4-5 episode of soft stool a day. Worsening started last weekend with up to 20 episodes of large watery stool in a  day with no blood. Denies fevers, chills, sweats or abdominakl cramps. Denies recent travel or unusual food intake. Denies pets   At ED, afebrile Labs remarkable for NA 130, CR 0.59, WBC 7  Exam, thin white lady, was sitting in the commode            Oral mucosa dry and dehydrated            Heart and lung sounds WNL            Abdomen soft and mild tenderness in the LLQ, non distended , no RT and guarding, BS ++            Extremities no pedal edema            Skin no rashes   Labs/Microbiology and Imaging reviewed  Plan  Abdominal Xray  Continue Fidaxomicin as is for now pending C diff test results.  She will benefit from referral to [facility] or [facility] for FMT as FMT is currently on hold at [facility] per my understanding after covid [place], [place] Infectious Disease Physician [facility] for Infectious Disease [address] [place], [place] [address] Phone: [phone]  Fax: [place] for Infectious Disease    Date of Admission:  02/06/2022     Total days of  antibiotics              Reason for Consult: Recurrent CDIff     Referring Provider: Cruzita Lederer Primary Care Provider: Cassandria Anger, MD   Assessment: Julie Montgomery is a 86 y.o. female admitted for the 3rd time this year for management of refractory diarrhea with dehydration. H/O previous CDiff PCR (+), Toxin (-), Ag (+) on stool tests since August of this year after she had recurrent C diff symptoms following a course of cephalexin for "possible early cellulitis" from recent Moh's surgery for BCC. First treatment in late August 2023 with complete resolution of symptoms; recurrence with another admission 9/14, same non-toxigenic CDiff profile on testing. She did not have as good of a response with this 10-d course of dificid and had a faster relapse this time.   Resumed on Dificid BID now - would initiate her on a taper protocol this time - though more data with PO vanc taper to decrease relapse, the Dificid taper is much easier to follow and probably just as effective. Can help to refer for FMT evaluation at [facility] or [facility].  Given she was previously non-toxin producing, dose of IV Zinplava mAb unlikely to be effective for her. We can look into the rectally administered Re[place]ota  x 1 at the end of treatment to see if that will help achieve a cure for her. D/W GI and primary team.   She has no urinary symptoms - seems most consistent with contaminated sample; would not treat an abnormal UA without symptoms in this patient suffering with severe diarrhea with high concern of relapsing CDiff infection. Stop ceftriaxone/IV metronidazole.     Plan: STOP ceftriaxone / metronidazole  Continue Dificid for now pending C diff tests Will look into Rebyota to give outpatient in clinic Will work on referral to [facility] for [place] evaluation Enteric precautions  Follow current test results pending    Principal Problem:   Clostridium difficile diarrhea    enoxaparin (LOVENOX) injection  30 mg  Subcutaneous Daily   fidaxomicin  200 mg Oral BID   mupirocin ointment  1 Application Topical Daily   saccharomyces boulardii  250 mg Oral BID    HPI: DULCY SIDA is a 86 y.o. female admitted from home with diarrhea.   PMHx mild dementia, recurrent cdiff, H Pylori, Osteopenia, PNA, TIA, Depression/Anxiety.   There was concern for UTI in ER based on UA results - started on Rocephin and IV flagyl for co-management as well as fidaximicin and florastor BID. Mildly hypovolemic and severe protein calorie malnutrition and sarcopenia.   CDiff history started in 2016 with recurrence in 2018 and again in August 2023 after she received a course of cephalexin for 10 days for prophylaxis after Mohs surgery.  She states that this is her 4th time with CDiff; uses the bathroom 15-25 times a day and reports it is very watery. She has some LLQ tenderness that is mild. No N/V or bloating. No fevers/chills.  Has had some challenges keeping weight on her.   She has had multiple admissions / ER visits for this problem over the last few months. She has had multiple courses of fidaxomicin 8/12 x 10d, 9/16 x 10d. With first course of fidaxomicin she had great improvement and normal stools. When she had relapse 3 weeks after she had a moderate response to fidaxomidin and states that she never had complete resolution of diarrhea, volume/frequency did improve but over the last 2 weeks this has abruptly escalated back to same quality of active disease.  She does not take any PPIs. No new antibiotics since August. No travel history or pet/animal exposure history.     Review of Systems: Review of Systems  Constitutional:  Positive for malaise/fatigue and weight loss. Negative for chills and fever.  Respiratory: Negative.    Cardiovascular: Negative.   Gastrointestinal:  Positive for abdominal pain and diarrhea. Negative for nausea and vomiting.  Genitourinary:  Negative for dysuria, flank pain, frequency and urgency.   Musculoskeletal: Negative.   Skin: Negative.     Past Medical History:  Diagnosis Date   Adenomatous polyp of colon 2020   Allergy    Anxiety    Basal cell carcinoma (BCC)    Cataract    Depression    Helicobacter pylori gastritis    Osteopenia    Pneumonia    hx of 03/2008   Stroke [facility])    TIA long time ago   Past Surgical History:  Procedure Laterality Date   APPENDECTOMY     BREAST LUMPECTOMY     right   CATARACT EXTRACTION W/ INTRAOCULAR LENS  IMPLANT, BILATERAL Bilateral    CESAREAN SECTION     CHOLECYSTECTOMY     MOHS SURGERY     PARTIAL HYSTERECTOMY  ROTATOR CUFF REPAIR  2012   SKIN GRAFT     teeth remmoved      Social History   Tobacco Use   Smoking status: Never   Smokeless tobacco: Never  Vaping Use   Vaping Use: Never used  Substance Use Topics   Alcohol use: No   Drug use: No    Family History  Problem Relation Age of Onset   Diabetes Sister    Cancer Sister        lung ca   Coronary artery disease Brother    Dementia Brother    Cancer Brother        lung ca   Stomach cancer Paternal Grandmother    Colon cancer Son    Breast cancer Neg Hx    Esophageal cancer Neg Hx    Rectal cancer Neg Hx    Allergies  Allergen Reactions   Lovastatin Other (See Comments)    Unknown reaction   Mometasone Furo-Formoterol Fum Other (See Comments)    Loss of appetite, laryngitis    Prozac [Fluoxetine Hcl] Other (See Comments)    Jumpy   Sulfa Antibiotics Other (See Comments)    Unknown reaction    OBJECTIVE: Blood pressure (!) 103/55, pulse 68, temperature 98.7 F (37.1 C), temperature source Oral, resp. rate 17, height '5\' 2"'$  (1.575 m), weight 40.4 kg, SpO2 93 %.  Physical Exam Vitals reviewed.  Constitutional:      Appearance: She is well-developed.     Comments: Thin, undernourished female. Fatigued appearing. Non-toxic  Cardiovascular:     Rate and Rhythm: Normal rate and regular rhythm.  Pulmonary:     Effort: Pulmonary effort is  normal.     Breath sounds: Normal breath sounds.  Abdominal:     General: There is no distension.     Palpations: Abdomen is soft.     Tenderness: There is abdominal tenderness in the left lower quadrant. There is no guarding or rebound.  Skin:    General: Skin is warm and dry.     Coloration: Skin is pale.  Neurological:     Mental Status: She is oriented to person, place, and time.     Lab Results Lab Results  Component Value Date   WBC 4.7 02/07/2022   HGB 10.6 (L) 02/07/2022   HCT 30.9 (L) 02/07/2022   MCV 93.1 02/07/2022   PLT 205 02/07/2022    Lab Results  Component Value Date   CREATININE 0.64 02/07/2022   BUN 23 02/07/2022   NA 133 (L) 02/07/2022   K 3.5 02/07/2022   CL 103 02/07/2022   CO2 20 (L) 02/07/2022    Lab Results  Component Value Date   ALT 20 02/07/2022   AST 21 02/07/2022   ALKPHOS 83 02/07/2022   BILITOT 0.8 02/07/2022     Microbiology: No results found for this or any previous visit (from the past 240 hour(s)).  Imaging DG Abd 2 Views  Result Date: 02/07/2022 CLINICAL DATA:  Abdominal pain. EXAM: ABDOMEN - 2 VIEW COMPARISON:  Acute abdominal series 03/22/2010. Abdominal CT 01/11/2022. FINDINGS: The bowel gas pattern is nonobstructive. There is no free intraperitoneal air or suspicious abdominal calcification. Cholecystectomy clips are noted. There is multifocal costal calcification. Grossly stable peripherally calcified splenic artery aneurysm. IMPRESSION: No evidence of bowel obstruction or other acute process. Stable splenic artery aneurysm. Electronically Signed   By: Richardean Sale M.D.   On: 02/07/2022 17:50   DG Chest 2 View  Result Date: 01/29/2022  CLINICAL DATA:  Dyspnea.  The history of pneumonia. EXAM: CHEST - 2 VIEW COMPARISON:  12/24/2021, chest radiographs and chest CT. FINDINGS: Cardiac silhouette is normal in size. No mediastinal or hilar masses. No evidence of adenopathy. Lungs are hyperexpanded. Prominent bronchovascular and  interstitial markings. No evidence of pneumonia or pulmonary edema. No pleural effusion or pneumothorax. Skeletal structures are intact. IMPRESSION: 1. No acute cardiopulmonary disease. Electronically Signed   By: Lajean Manes M.D.   On: 01/29/2022 13:51   CT Abdomen Pelvis W Contrast  Result Date: 01/11/2022 CLINICAL DATA:  Abdominal pain, acute, nonlocalized. C diff, assess for degree of colitis. EXAM: CT ABDOMEN AND PELVIS WITH CONTRAST TECHNIQUE: Multidetector CT imaging of the abdomen and pelvis was performed using the standard protocol following bolus administration of intravenous contrast. RADIATION DOSE REDUCTION: This exam was performed according to the departmental dose-optimization program which includes automated exposure control, adjustment of the mA and/or kV according to patient size and/or use of iterative reconstruction technique. CONTRAST:  36m OMNIPAQUE IOHEXOL 350 MG/ML SOLN COMPARISON:  CT abdomen and pelvis 12/24/2021 FINDINGS: Lower chest: Small, partly clustered nodular densities located predominantly peripherally in both lung bases. Scattered mucous plugging in the airways. Resolved pleural effusions. Hepatobiliary: No focal liver abnormality is seen. Status post cholecystectomy. No biliary dilatation. Pancreas: Unremarkable. Spleen: Unremarkable. Adrenals/Urinary Tract: Unremarkable adrenal glands. 1.5 cm left renal cyst for which no follow-up imaging is recommended. No renal calculi or hydronephrosis. Unremarkable bladder. Stomach/Bowel: The stomach is unremarkable. There is no evidence of bowel obstruction. There is new wall thickening diffusely involving the colon which appears worst in the right colon. Gas and scattered small fluid levels are present throughout the colon, and there is surrounding inflammatory stranding diffusely about the colon and rectum. Vascular/Lymphatic: Abdominal aortic atherosclerosis. Unchanged 1.5 cm coarsely calcified splenic artery aneurysm. No enlarged  lymph nodes. Reproductive: Status post hysterectomy. No adnexal masses. Other: Small volume pelvic free fluid.  No pneumoperitoneum. Musculoskeletal: No acute osseous abnormality or suspicious osseous lesion. Advanced lower lumbar facet arthrosis. IMPRESSION: 1. New diffuse colonic inflammation consistent with the provided history of C difficile colitis. 2. Small volume pelvic free fluid. 3. Small peripheral nodular densities in both lung bases, likely infectious/inflammatory. 4. Aortic Atherosclerosis (ICD10-I70.0). Electronically Signed   By: ALogan BoresM.D.   On: 01/11/2022 15:51    SJanene Madeira MSN, NP-C RBrown County Hospitalfor Infectious DSawyervillePager: [phone] 02/07/2022 8:43 AM

## 2022-02-08 ENCOUNTER — Other Ambulatory Visit (HOSPITAL_COMMUNITY): Payer: Self-pay

## 2022-02-08 DIAGNOSIS — E860 Dehydration: Secondary | ICD-10-CM | POA: Diagnosis not present

## 2022-02-08 DIAGNOSIS — A0472 Enterocolitis due to Clostridium difficile, not specified as recurrent: Secondary | ICD-10-CM | POA: Diagnosis not present

## 2022-02-08 DIAGNOSIS — R197 Diarrhea, unspecified: Secondary | ICD-10-CM | POA: Diagnosis not present

## 2022-02-08 LAB — COMPREHENSIVE METABOLIC PANEL
ALT: 23 U/L (ref 0–44)
AST: 26 U/L (ref 15–41)
Albumin: 2.6 g/dL — ABNORMAL LOW (ref 3.5–5.0)
Alkaline Phosphatase: 74 U/L (ref 38–126)
Anion gap: 8 (ref 5–15)
BUN: 16 mg/dL (ref 8–23)
CO2: 22 mmol/L (ref 22–32)
Calcium: 8.6 mg/dL — ABNORMAL LOW (ref 8.9–10.3)
Chloride: 102 mmol/L (ref 98–111)
Creatinine, Ser: 0.7 mg/dL (ref 0.44–1.00)
GFR, Estimated: >60 mL/min (ref >60)
Glucose, Bld: 151 mg/dL — ABNORMAL HIGH (ref 70–99)
Potassium: 3.4 mmol/L — ABNORMAL LOW (ref 3.5–5.1)
Sodium: 132 mmol/L — ABNORMAL LOW (ref 135–145)
Total Bilirubin: 0.5 mg/dL (ref 0.3–1.2)
Total Protein: 5.1 g/dL — ABNORMAL LOW (ref 6.5–8.1)

## 2022-02-08 LAB — CBC
HCT: 32.2 % — ABNORMAL LOW (ref 36.0–46.0)
Hemoglobin: 11 g/dL — ABNORMAL LOW (ref 12.0–15.0)
MCH: 31.5 pg (ref 26.0–34.0)
MCHC: 34.2 g/dL (ref 30.0–36.0)
MCV: 92.3 fL (ref 80.0–100.0)
Platelets: 179 10*3/uL (ref 150–400)
RBC: 3.49 MIL/uL — ABNORMAL LOW (ref 3.87–5.11)
RDW: 14.2 % (ref 11.5–15.5)
WBC: 4.1 10*3/uL (ref 4.0–10.5)
nRBC: 0 % (ref 0.0–0.2)

## 2022-02-08 LAB — C DIFFICILE QUICK SCREEN W PCR REFLEX
C Diff antigen: NEGATIVE
C Diff interpretation: NOT DETECTED
C Diff toxin: NEGATIVE

## 2022-02-08 LAB — MAGNESIUM: Magnesium: 1.7 mg/dL (ref 1.7–2.4)

## 2022-02-08 MED ORDER — MAGNESIUM SULFATE 2 GM/50ML IV SOLN
2.0000 g | Freq: Once | INTRAVENOUS | Status: AC
  Administered 2022-02-08: 2 g via INTRAVENOUS
  Filled 2022-02-08: qty 50

## 2022-02-08 MED ORDER — BANATROL TF EN LIQD
60.0000 mL | Freq: Two times a day (BID) | ENTERAL | Status: DC
  Administered 2022-02-08 – 2022-02-12 (×7): 60 mL via ORAL
  Filled 2022-02-08 (×8): qty 60

## 2022-02-08 MED ORDER — POTASSIUM CHLORIDE CRYS ER 20 MEQ PO TBCR
40.0000 meq | EXTENDED_RELEASE_TABLET | Freq: Once | ORAL | Status: AC
  Administered 2022-02-08: 40 meq via ORAL
  Filled 2022-02-08: qty 2

## 2022-02-08 MED ORDER — BOOST PLUS PO LIQD
237.0000 mL | Freq: Two times a day (BID) | ORAL | Status: DC
  Administered 2022-02-09 – 2022-02-12 (×7): 237 mL via ORAL
  Filled 2022-02-08 (×8): qty 237

## 2022-02-08 MED ORDER — ADULT MULTIVITAMIN W/MINERALS CH
1.0000 | ORAL_TABLET | Freq: Every day | ORAL | Status: DC
  Administered 2022-02-08 – 2022-02-12 (×5): 1 via ORAL
  Filled 2022-02-08 (×5): qty 1

## 2022-02-08 NOTE — Progress Notes (Addendum)
I have seen and examined the patient. I have personally reviewed the clinical findings, laboratory findings, microbiological data and imaging studies. The assessment and treatment plan was discussed with the Nurse Practitioner Juanetta Gosling. I agree with her/his recommendations except following additions/corrections.  Afebrile, no changes in diarrhea  10/11 abdomen Xray no acute abnormality   10/12 C diff antigen as well as toxin is negative. She has gotten only a couple of doses of fidaxomicin and would have expected antigen to be positive at least if this is a true C diff. D/w ID pharm D as well.   She had negative toxin for C diff both times in August as well as September when she was treated for C diff. Her Bms has not reached baseline after her C diff tx in September 2023.   DC fidaxomicin Fu GI pathogen panel  Fu GI recs regarding plans for colonoscopy and further work up Maintain adequate hydration   Rosiland Oz, MD Infectious Disease Physician [facility] for Infectious Disease [address] [place], [place] [address] Phone: [phone]  Fax: [place] for Infectious Disease  Date of Admission:  02/06/2022      Total days of antibiotics   Fidaxomicin day 2 (10/11 start)         ASSESSMENT: Julie Montgomery is a 86 y.o. female admitted with recurrent severe watery diarrhea with history of CDiff infection x 29 Nov 2021, Sept 2023 treated with 10-d course fidaxomicin both times. However, negative toxin both times.  We had a long conversation with Julie Montgomery and her husband going over recommended plan of Fidaxomicin 20-d taper THEN (if we can get it through patient assistance) finish this with a course of Vowst to hopefully prevent recurrent episodes.   She got a little confused and stressed with discussing today. Will wait for their son to come up for a visit and we can try to go over this again.     [facility] [phone] - I would like to plan a referral for FMT evaluation now as a plan B if the Dificid taper + Vowst plan does not work, under the expectation that this would take a while for the appointment to occur.    PLAN: Continue the dificid twice a day for 5 days  THEN will dose every other day for 20-d taper course  Working on [employer] patient assistance - appreciate ID pharmacy team      Principal Problem:   Clostridium difficile diarrhea Active Problems:   Dehydration    enoxaparin (LOVENOX) injection  30 mg Subcutaneous Daily   fidaxomicin  200 mg Oral BID   mupirocin ointment  1 Application Topical Daily   saccharomyces boulardii  250 mg Oral BID    SUBJECTIVE: Diarrhea volume/frequency has been unchanged. She does feel better / stronger compared to yesterday. No fever or chills. Abdominal tenderness is mild.  Her husband is at the bedside.    Review of Systems: Review of Systems  Constitutional:  Positive for weight loss. Negative for chills and fever.  Respiratory: Negative.    Gastrointestinal:  Positive for abdominal pain and diarrhea. Negative for blood in stool, nausea and vomiting.  Genitourinary: Negative.   Musculoskeletal: Negative.   Skin: Negative.      Allergies  Allergen Reactions   Lovastatin Other (See Comments)    Unknown reaction   Mometasone Furo-Formoterol Fum Other (See Comments)    Loss of  appetite, laryngitis    Prozac [Fluoxetine Hcl] Other (See Comments)    Jumpy   Sulfa Antibiotics Other (See Comments)    Unknown reaction    OBJECTIVE: Vitals:   02/07/22 2147 02/08/22 0000 02/08/22 0440 02/08/22 0844  BP: 125/73 136/69 (!) 104/53 (!) 85/41  Pulse: 82 92 68 69  Resp: '19 18 16 16  '$ Temp: 99.9 F (37.7 C) 99.6 F (37.6 C) 98.9 F (37.2 C) 98.8 F (37.1 C)  TempSrc: Oral Oral Oral Oral  SpO2: 96% 97% 93% 95%  Weight: 41.5 kg     Height: '5\' 2"'$  (1.575 m)      Body mass index is 16.73 kg/m.  Physical  Exam Constitutional:      General: She is not in acute distress.    Appearance: She is ill-appearing. She is not toxic-appearing.  Cardiovascular:     Rate and Rhythm: Normal rate.  Pulmonary:     Effort: Pulmonary effort is normal.     Breath sounds: Normal breath sounds.  Abdominal:     Palpations: Abdomen is soft.     Tenderness: There is abdominal tenderness in the left lower quadrant.  Neurological:     Mental Status: She is alert.     Lab Results Lab Results  Component Value Date   WBC 4.1 02/08/2022   HGB 11.0 (L) 02/08/2022   HCT 32.2 (L) 02/08/2022   MCV 92.3 02/08/2022   PLT 179 02/08/2022    Lab Results  Component Value Date   CREATININE 0.70 02/08/2022   BUN 16 02/08/2022   NA 132 (L) 02/08/2022   K 3.4 (L) 02/08/2022   CL 102 02/08/2022   CO2 22 02/08/2022    Lab Results  Component Value Date   ALT 23 02/08/2022   AST 26 02/08/2022   ALKPHOS 74 02/08/2022   BILITOT 0.5 02/08/2022     Microbiology: No results found for this or any previous visit (from the past 240 hour(s)).  Imaging DG Abd 2 Views  Result Date: 02/07/2022 CLINICAL DATA:  Abdominal pain. EXAM: ABDOMEN - 2 VIEW COMPARISON:  Acute abdominal series 03/22/2010. Abdominal CT 01/11/2022. FINDINGS: The bowel gas pattern is nonobstructive. There is no free intraperitoneal air or suspicious abdominal calcification. Cholecystectomy clips are noted. There is multifocal costal calcification. Grossly stable peripherally calcified splenic artery aneurysm. IMPRESSION: No evidence of bowel obstruction or other acute process. Stable splenic artery aneurysm. Electronically Signed   By: Richardean Sale M.D.   On: 02/07/2022 17:50   DG Chest 2 View  Result Date: 01/29/2022 CLINICAL DATA:  Dyspnea.  The history of pneumonia. EXAM: CHEST - 2 VIEW COMPARISON:  12/24/2021, chest radiographs and chest CT. FINDINGS: Cardiac silhouette is normal in size. No mediastinal or hilar masses. No evidence of adenopathy.  Lungs are hyperexpanded. Prominent bronchovascular and interstitial markings. No evidence of pneumonia or pulmonary edema. No pleural effusion or pneumothorax. Skeletal structures are intact. IMPRESSION: 1. No acute cardiopulmonary disease. Electronically Signed   By: Lajean Manes M.D.   On: 01/29/2022 13:51   CT Abdomen Pelvis W Contrast  Result Date: 01/11/2022 CLINICAL DATA:  Abdominal pain, acute, nonlocalized. C diff, assess for degree of colitis. EXAM: CT ABDOMEN AND PELVIS WITH CONTRAST TECHNIQUE: Multidetector CT imaging of the abdomen and pelvis was performed using the standard protocol following bolus administration of intravenous contrast. RADIATION DOSE REDUCTION: This exam was performed according to the departmental dose-optimization program which includes automated exposure control, adjustment of the mA and/or kV according to  patient size and/or use of iterative reconstruction technique. CONTRAST:  76m OMNIPAQUE IOHEXOL 350 MG/ML SOLN COMPARISON:  CT abdomen and pelvis 12/24/2021 FINDINGS: Lower chest: Small, partly clustered nodular densities located predominantly peripherally in both lung bases. Scattered mucous plugging in the airways. Resolved pleural effusions. Hepatobiliary: No focal liver abnormality is seen. Status post cholecystectomy. No biliary dilatation. Pancreas: Unremarkable. Spleen: Unremarkable. Adrenals/Urinary Tract: Unremarkable adrenal glands. 1.5 cm left renal cyst for which no follow-up imaging is recommended. No renal calculi or hydronephrosis. Unremarkable bladder. Stomach/Bowel: The stomach is unremarkable. There is no evidence of bowel obstruction. There is new wall thickening diffusely involving the colon which appears worst in the right colon. Gas and scattered small fluid levels are present throughout the colon, and there is surrounding inflammatory stranding diffusely about the colon and rectum. Vascular/Lymphatic: Abdominal aortic atherosclerosis. Unchanged 1.5 cm  coarsely calcified splenic artery aneurysm. No enlarged lymph nodes. Reproductive: Status post hysterectomy. No adnexal masses. Other: Small volume pelvic free fluid.  No pneumoperitoneum. Musculoskeletal: No acute osseous abnormality or suspicious osseous lesion. Advanced lower lumbar facet arthrosis. IMPRESSION: 1. New diffuse colonic inflammation consistent with the provided history of C difficile colitis. 2. Small volume pelvic free fluid. 3. Small peripheral nodular densities in both lung bases, likely infectious/inflammatory. 4. Aortic Atherosclerosis (ICD10-I70.0). Electronically Signed   By: ALogan BoresM.D.   On: 01/11/2022 15:51    SJanene Madeira MSN, NP-C [facility]for Infectious Disease [facility] Pager: [phone] '@TODAY'$ @ 10:33 AM

## 2022-02-08 NOTE — Progress Notes (Signed)
Progress Note   Subjective  Patient feels about the same, having some diarrhea - she thinks the same as yesterday in regards to frequency but son and husband think volume is less. Otherwise she does feel improved with fluids and eating well.    Objective   Vital signs in last 24 hours: Temp:  [97.7 F (36.5 C)-99.9 F (37.7 C)] 97.7 F (36.5 C) (10/12 1241) Pulse Rate:  [68-92] 79 (10/12 1241) Resp:  [16-28] 16 (10/12 1241) BP: (85-136)/(41-110) 105/50 (10/12 1241) SpO2:  [91 %-99 %] 98 % (10/12 1241) Weight:  [41.5 kg] 41.5 kg (10/11 2147) Last BM Date : 02/07/22 General:    white female in NAD Neurologic:  Alert and oriented,  grossly normal neurologically. Psych:  Cooperative. Normal mood and affect.  Intake/Output from previous day: 10/11 0701 - 10/12 0700 In: 1382.1 [I.V.:1382.1] Out: -  Intake/Output this shift: No intake/output data recorded.  Lab Results: Recent Labs    02/06/22 1805 02/07/22 0352 02/08/22 0223  WBC 7.0 4.7 4.1  HGB 12.0 10.6* 11.0*  HCT 37.2 30.9* 32.2*  PLT 248 205 179   BMET Recent Labs    02/06/22 1805 02/07/22 0352 02/08/22 0223  NA 130* 133* 132*  K 3.7 3.5 3.4*  CL 99 103 102  CO2 21* 20* 22  GLUCOSE 103* 114* 151*  BUN 24* 23 16  CREATININE 0.59 0.64 0.70  CALCIUM 9.2 8.5* 8.6*   LFT Recent Labs    02/08/22 0223  PROT 5.1*  ALBUMIN 2.6*  AST 26  ALT 23  ALKPHOS 74  BILITOT 0.5   PT/INR No results for input(s): "LABPROT", "INR" in the last 72 hours.  Studies/Results: DG Abd 2 Views  Result Date: 02/07/2022 CLINICAL DATA:  Abdominal pain. EXAM: ABDOMEN - 2 VIEW COMPARISON:  Acute abdominal series 03/22/2010. Abdominal CT 01/11/2022. FINDINGS: The bowel gas pattern is nonobstructive. There is no free intraperitoneal air or suspicious abdominal calcification. Cholecystectomy clips are noted. There is multifocal costal calcification. Grossly stable peripherally calcified splenic artery aneurysm. IMPRESSION:  No evidence of bowel obstruction or other acute process. Stable splenic artery aneurysm. Electronically Signed   By: Richardean Sale M.D.   On: 02/07/2022 17:50       Assessment / Plan:    86 y/o female here for the following issues:  Recurrent diarrhea Dehydration History of C Diff  Multiple courses of C. difficile in recent months which have responded appropriately to medical therapy but unfortunately has had recurrence of C Diff.  Recent antibiotic use again with recurrent diarrhea.  Highly suspicious for C. difficile, started Dificid yesterday.  Surprisingly her stool test from today was negative for C. difficile.  One would think even after a few doses of Dificid this should still be positive if she has C. difficile.  GI pathogen panel is pending.  Discussed the situation with the patient and her family.  Son and husband feel that she is improved today on Dificid, the patient states her diarrhea has not changed too much yet.  We would like to wait for GI pathogen panel first and see how she does over the rest of today before making decision on how to proceed.  If C. difficile is negative, GI pathogen panel negative, symptoms persist despite therapy, we can consider a colonoscopy if needed.  Family would like to avoid that if possible.  Clinically her symptoms are very consistent with that of C. difficile, she has taken a day or 2  of antibiotics to respond for C. difficile treatment in the past. If she did have C Diff, had discussed that a course of dificid would be completed and then a course of Vowst afterwards to prevent recurrence (or consideration for FMT at tertiary care facility - which I believe would take some time to coordinate).  We will follow closely, await results of her labs and determine if she needs further evaluation.  Dr. Lorenso Courier to assume the GI inpatient service tomorrow.  Jolly Mango, MD [facility]

## 2022-02-08 NOTE — Progress Notes (Signed)
Initial Nutrition Assessment  DOCUMENTATION CODES:   Severe malnutrition in context of chronic illness, Underweight  INTERVENTION:   Encourage good PO intake Multivitamin w/ minerals daily Boost Plus po BID, each supplement provides 360 kcal and 14 grams of protein Banatrol TF via PO - BID  NUTRITION DIAGNOSIS:   Severe Malnutrition related to chronic illness as evidenced by severe muscle depletion, severe fat depletion.  GOAL:   Patient will meet greater than or equal to 90% of their needs  MONITOR:   PO intake, Supplement acceptance, I & O's, Labs  REASON FOR ASSESSMENT:   Consult Assessment of nutrition requirement/status  ASSESSMENT:   86 y.o. female presented to the ED with increased diarrhea and abdominal cramping. PMH includes IBS, malnutrition, dementia, and recurrent C. Diff infections. Pt admitted with suspected recurrent C. Diff infection.   Met with pt in room. Pt reports that she hasn't been eating as well since bing in the hospital but was eating well at home. Reports that she eats 3 meals per day and drinks 1 Boost shake per day. When asked what type of things she eats at home, she responds with "food." Pt endorses significant amount of weight loss within the past 2 years secondary to C. Diff. Reports a UBW of 105# and currently weighs 88#. Per EMR, pt weight has remained stable over the past year.  RD offered to order pt Boost while her since she already does them at home; pt agreeable. Discussed the use of Banatrol to assist in diarrhea, pt willing to try. RD left sample in room.   Medications reviewed and include: Fidaxomicin, Florastor Labs reviewed: Sodium 132, Potassium 3.4   NUTRITION - FOCUSED PHYSICAL EXAM:  Flowsheet Row Most Recent Value  Orbital Region Severe depletion  Upper Arm Region Severe depletion  Thoracic and Lumbar Region Severe depletion  Buccal Region Severe depletion  Temple Region Severe depletion  Clavicle Bone Region Severe  depletion  Clavicle and Acromion Bone Region Severe depletion  Scapular Bone Region Severe depletion  Dorsal Hand Moderate depletion  Patellar Region Moderate depletion  Anterior Thigh Region Moderate depletion  Posterior Calf Region Severe depletion  Edema (RD Assessment) None  Hair Reviewed  Eyes Reviewed  Mouth Reviewed  Skin Reviewed  Nails Reviewed   Diet Order:   Diet Order             Diet regular Room service appropriate? Yes; Fluid consistency: Thin  Diet effective now                   EDUCATION NEEDS:   Education needs have been addressed  Skin:  Skin Assessment: Reviewed RN Assessment  Last BM:  10/11  Height:   Ht Readings from Last 1 Encounters:  02/07/22 _0  (1.575 m)    Weight:   Wt Readings from Last 1 Encounters:  02/07/22 41.5 kg    Ideal Body Weight:  50 kg  BMI:  Body mass index is 16.73 kg/m.  Estimated Nutritional Needs:   Kcal:  1400-1600  Protein:  70-90 grams  Fluid:  >/= 1.5 L    Hermina Barters RD, LDN Clinical Dietitian See [facility] for contact information.

## 2022-02-08 NOTE — Progress Notes (Signed)
Transition of Care Department [facility]) following patient for high risk of readmission.   Patient is readmitted for C-diff. Patient has used [facility] home [facility] in the past.   Transition of Care Department [facility]) has reviewed patient and we will continue to monitor patient advancement through interdisciplinary progression rounds.

## 2022-02-08 NOTE — Progress Notes (Signed)
PROGRESS NOTE  Julie Montgomery TIR:[id number] DOB: 1936-04-03 DOA: 02/06/2022 PCP: Cassandria Anger, MD   LOS: 1 day   Brief Narrative / Interim history: she is known to me from back in August when she was admitted with severe diarrhea, dehydration, electrolyte imbalances.  She tested positive for C. difficile, afterwards she was placed on fidaxomicin with significant improvement in her symptoms and discharged home.  She was admitted again in September with recurrent diarrhea, and a CT scan of the abdomen and pelvis showing diffuse colonic inflammation consistent with C. difficile infection.  She improved with Dificid and sent home.  She is back in the hospital with 20+ watery bowel movements over the last several days, mild electrolyte imbalances with hypomagnesemia.  Due to recurrent symptoms ID and GI consulted  Subjective / 24h Interval events: Continues to have diarrhea.  No abdominal pain, no nausea or vomiting  Assesement and Plan: Principal Problem:   Clostridium difficile diarrhea Active Problems:   Dehydration   Principal problem Recurrent C. difficile infection-for the past 3 months she has been hospitalized 3 times.  This all started after prophylactic Keflex for scalp surgery.  She has recurrent symptoms, ID and GI following.  For now she has been placed on Dificid.  Continue.  Active problems Hyponatremia-dehydrated due to diarrhea.  Monitor  Hypokalemia-replete and continue to monitor.  Hypomagnesemia-replete and continue to monitor  Mild cognitive impairment-continue Namenda  Paroxysmal A-fib-rate controlled, continue Eliquis, metoprolol   Scheduled Meds:  enoxaparin (LOVENOX) injection  30 mg Subcutaneous Daily   fidaxomicin  200 mg Oral BID   mupirocin ointment  1 Application Topical Daily   saccharomyces boulardii  250 mg Oral BID   Continuous Infusions:  lactated ringers 75 mL/hr at 02/08/22 0002   PRN Meds:.acetaminophen, melatonin,  prochlorperazine  Current Outpatient Medications  Medication Instructions   Align 4 mg, Oral, Daily   apixaban (ELIQUIS) 2.5 mg, 2 times daily   citalopram (CELEXA) 10 mg, Oral, Daily   furosemide (LASIX) 20 mg   memantine (NAMENDA) 5 mg   metoprolol succinate (TOPROL-XL) 25 mg   metoprolol tartrate (LOPRESSOR) 25 mg   mupirocin ointment (BACTROBAN) 2 % 1 Application, Topical, Daily   Polyethyl Glycol-Propyl Glycol (SYSTANE OP) 1 drop, Both Eyes, Daily   VITAMIN D PO 1 tablet, Oral, Daily    Diet Orders (From admission, onward)     Start     Ordered   02/07/22 0326  Diet regular Room service appropriate? Yes; Fluid consistency: Thin  Diet effective now       Question Answer Comment  Room service appropriate? Yes   Fluid consistency: Thin      02/07/22 0325            DVT prophylaxis: enoxaparin (LOVENOX) injection 30 mg Start: 02/07/22 1000   Lab Results  Component Value Date   PLT 179 02/08/2022      Code Status: Full Code  Family Communication: no family at bedside   Status is: Inpatient  Remains inpatient appropriate because: ongoing symptoms  Level of care: Telemetry Medical  Consultants:  GI ID  Objective: Vitals:   02/07/22 2147 02/08/22 0000 02/08/22 0440 02/08/22 0844  BP: 125/73 136/69 (!) 104/53 (!) 85/41  Pulse: 82 92 68 69  Resp: '19 18 16 16  '$ Temp: 99.9 F (37.7 C) 99.6 F (37.6 C) 98.9 F (37.2 C) 98.8 F (37.1 C)  TempSrc: Oral Oral Oral Oral  SpO2: 96% 97% 93% 95%  Weight: 41.5 kg  Height: '5\' 2"'$  (1.575 m)       Intake/Output Summary (Last 24 hours) at 02/08/2022 1136 Last data filed at 02/08/2022 1478 Gross per 24 hour  Intake 1382.08 ml  Output --  Net 1382.08 ml   Wt Readings from Last 3 Encounters:  02/07/22 41.5 kg  01/26/22 40.4 kg  01/16/22 41.5 kg    Examination:  Constitutional: NAD Eyes: no scleral icterus ENMT: Mucous membranes are moist.  Neck: normal, supple Respiratory: clear to auscultation  bilaterally, no wheezing, no crackles. Normal respiratory effort. No accessory muscle use.  Cardiovascular: Regular rate and rhythm, no murmurs / rubs / gallops. No LE edema.  Abdomen: non distended, no tenderness. Bowel sounds positive.  Musculoskeletal: no clubbing / cyanosis.    Data Reviewed: I have independently reviewed following labs and imaging studies   CBC Recent Labs  Lab 02/06/22 1805 02/07/22 0352 02/08/22 0223  WBC 7.0 4.7 4.1  HGB 12.0 10.6* 11.0*  HCT 37.2 30.9* 32.2*  PLT 248 205 179  MCV 95.4 93.1 92.3  MCH 30.8 31.9 31.5  MCHC 32.3 34.3 34.2  RDW 14.2 14.2 14.2  LYMPHSABS 0.5* 0.3*  --   MONOABS 0.4 0.4  --   EOSABS 0.0 0.0  --   BASOSABS 0.0 0.0  --     Recent Labs  Lab 02/06/22 1805 02/07/22 0352 02/08/22 0223  NA 130* 133* 132*  K 3.7 3.5 3.4*  CL 99 103 102  CO2 21* 20* 22  GLUCOSE 103* 114* 151*  BUN 24* 23 16  CREATININE 0.59 0.64 0.70  CALCIUM 9.2 8.5* 8.6*  AST '23 21 26  '$ ALT '23 20 23  '$ ALKPHOS 103 83 74  BILITOT 0.6 0.8 0.5  ALBUMIN 3.5 2.8* 2.6*  MG  --  1.6* 1.7    ------------------------------------------------------------------------------------------------------------------ No results for input(s): "CHOL", "HDL", "LDLCALC", "TRIG", "CHOLHDL", "LDLDIRECT" in the last 72 hours.  No results found for: "HGBA1C" ------------------------------------------------------------------------------------------------------------------ No results for input(s): "TSH", "T4TOTAL", "T3FREE", "THYROIDAB" in the last 72 hours.  Invalid input(s): "FREET3"  Cardiac Enzymes No results for input(s): "CKMB", "TROPONINI", "MYOGLOBIN" in the last 168 hours.  Invalid input(s): "CK" ------------------------------------------------------------------------------------------------------------------    Component Value Date/Time   BNP 276.8 (H) 12/24/2021 0323    CBG: No results for input(s): "GLUCAP" in the last 168 hours.  Recent Results (from the  past 240 hour(s))  C Difficile Quick Screen w PCR reflex     Status: None   Collection Time: 02/08/22  8:53 AM   Specimen: STOOL  Result Value Ref Range Status   C Diff antigen NEGATIVE NEGATIVE Final   C Diff toxin NEGATIVE NEGATIVE Final   C Diff interpretation No C. difficile detected.  Final    Comment: Performed at [facility], [place] [address] [place], [place] [address]     Radiology Studies: DG Abd 2 Views  Result Date: 02/07/2022 CLINICAL DATA:  Abdominal pain. EXAM: ABDOMEN - 2 VIEW COMPARISON:  Acute abdominal series 03/22/2010. Abdominal CT 01/11/2022. FINDINGS: The bowel gas pattern is nonobstructive. There is no free intraperitoneal air or suspicious abdominal calcification. Cholecystectomy clips are noted. There is multifocal costal calcification. Grossly stable peripherally calcified splenic artery aneurysm. IMPRESSION: No evidence of bowel obstruction or other acute process. Stable splenic artery aneurysm. Electronically Signed   By: Richardean Sale M.D.   On: 02/07/2022 17:50     Marzetta Board, MD, PhD [facility]  Between 7 am - 7 pm I am available, please contact me via  Amion (for emergencies) or Securechat (non urgent messages)  Between 7 pm - 7 am I am not available, please contact night coverage MD/APP via Amion

## 2022-02-09 DIAGNOSIS — A0472 Enterocolitis due to Clostridium difficile, not specified as recurrent: Secondary | ICD-10-CM | POA: Diagnosis not present

## 2022-02-09 LAB — COMPREHENSIVE METABOLIC PANEL
ALT: 19 U/L (ref 0–44)
AST: 18 U/L (ref 15–41)
Albumin: 2.4 g/dL — ABNORMAL LOW (ref 3.5–5.0)
Alkaline Phosphatase: 70 U/L (ref 38–126)
Anion gap: 6 (ref 5–15)
BUN: 7 mg/dL — ABNORMAL LOW (ref 8–23)
CO2: 24 mmol/L (ref 22–32)
Calcium: 8.4 mg/dL — ABNORMAL LOW (ref 8.9–10.3)
Chloride: 105 mmol/L (ref 98–111)
Creatinine, Ser: 0.46 mg/dL (ref 0.44–1.00)
GFR, Estimated: >60 mL/min (ref >60)
Glucose, Bld: 97 mg/dL (ref 70–99)
Potassium: 3.8 mmol/L (ref 3.5–5.1)
Sodium: 135 mmol/L (ref 135–145)
Total Bilirubin: 0.3 mg/dL (ref 0.3–1.2)
Total Protein: 4.8 g/dL — ABNORMAL LOW (ref 6.5–8.1)

## 2022-02-09 LAB — GASTROINTESTINAL PANEL BY PCR, STOOL (REPLACES STOOL CULTURE)

## 2022-02-09 LAB — CBC
HCT: 29.8 % — ABNORMAL LOW (ref 36.0–46.0)
Hemoglobin: 10.1 g/dL — ABNORMAL LOW (ref 12.0–15.0)
MCH: 31.2 pg (ref 26.0–34.0)
MCHC: 33.9 g/dL (ref 30.0–36.0)
MCV: 92 fL (ref 80.0–100.0)
Platelets: 205 10*3/uL (ref 150–400)
RBC: 3.24 MIL/uL — ABNORMAL LOW (ref 3.87–5.11)
RDW: 13.9 % (ref 11.5–15.5)
WBC: 4.1 10*3/uL (ref 4.0–10.5)
nRBC: 0 % (ref 0.0–0.2)

## 2022-02-09 LAB — MAGNESIUM: Magnesium: 1.9 mg/dL (ref 1.7–2.4)

## 2022-02-09 MED ORDER — METOPROLOL SUCCINATE ER 25 MG PO TB24
25.0000 mg | ORAL_TABLET | Freq: Every day | ORAL | Status: DC
  Administered 2022-02-09 – 2022-02-12 (×4): 25 mg via ORAL
  Filled 2022-02-09 (×4): qty 1

## 2022-02-09 MED ORDER — DILTIAZEM HCL-DEXTROSE 125-5 MG/125ML-% IV SOLN (PREMIX)
5.0000 mg/h | INTRAVENOUS | Status: DC
  Administered 2022-02-09: 5 mg/h via INTRAVENOUS
  Filled 2022-02-09: qty 125

## 2022-02-09 MED ORDER — DILTIAZEM LOAD VIA INFUSION
10.0000 mg | Freq: Once | INTRAVENOUS | Status: AC
  Administered 2022-02-09: 10 mg via INTRAVENOUS
  Filled 2022-02-09: qty 10

## 2022-02-09 NOTE — Progress Notes (Signed)
PROGRESS NOTE  Julie Montgomery ENI:[id number] DOB: Jan 04, 1936 DOA: 02/06/2022 PCP: Cassandria Anger, MD   LOS: 2 days   Brief Narrative / Interim history: she is known to me from back in August when she was admitted with severe diarrhea, dehydration, electrolyte imbalances.  She tested positive for C. difficile, afterwards she was placed on fidaxomicin with significant improvement in her symptoms and discharged home.  She was admitted again in September with recurrent diarrhea, and a CT scan of the abdomen and pelvis showing diffuse colonic inflammation consistent with C. difficile infection.  She improved with Dificid and sent home.  She is back in the hospital with 20+ watery bowel movements over the last several days, mild electrolyte imbalances with hypomagnesemia.  Due to recurrent symptoms ID and GI consulted  Subjective / 24h Interval events: Diarrhea seems to be improving.  No abdominal pains, no nausea or vomiting.  She is able to eat  Assesement and Plan: Principal problem Recurrent C. difficile infection-for the past 3 months she has been hospitalized 3 times.  This all started after prophylactic Keflex for scalp surgery.  She has recurrent symptoms, ID and GI following.  C. difficile is actually negative, with negative antigen.  Discussed with ID, they favor not treating.  Will discuss with GI as well  Active problems Paroxysmal A-fib-redeveloped A-fib with RVR this morning.  Rates in the 150s.  Start Cardizem infusion it appears that her blood pressure tolerates.  Stop home metoprolol as well  Hyponatremia-dehydrated due to diarrhea.  Improving, sodium has now normalized  Hypokalemia-replete and continue to monitor.  Potassium normalized this morning  Hypomagnesemia-replete and continue to monitor.  Magnesium normal this morning  Mild cognitive impairment-continue Namenda    Scheduled Meds:  enoxaparin (LOVENOX) injection  30 mg Subcutaneous Daily   fiber supplement  (BANATROL TF)  60 mL Oral BID   lactose free nutrition  237 mL Oral BID BM   metoprolol succinate  25 mg Oral Daily   multivitamin with minerals  1 tablet Oral Daily   mupirocin ointment  1 Application Topical Daily   saccharomyces boulardii  250 mg Oral BID   Continuous Infusions:  diltiazem (CARDIZEM) infusion 5 mg/hr (02/09/22 1132)   PRN Meds:.acetaminophen, melatonin, prochlorperazine  Current Outpatient Medications  Medication Instructions   Align 4 mg, Oral, Daily   apixaban (ELIQUIS) 2.5 mg, 2 times daily   citalopram (CELEXA) 10 mg, Oral, Daily   furosemide (LASIX) 20 mg   memantine (NAMENDA) 5 mg   metoprolol succinate (TOPROL-XL) 25 mg   metoprolol tartrate (LOPRESSOR) 25 mg   mupirocin ointment (BACTROBAN) 2 % 1 Application, Topical, Daily   Polyethyl Glycol-Propyl Glycol (SYSTANE OP) 1 drop, Both Eyes, Daily   VITAMIN D PO 1 tablet, Oral, Daily    Diet Orders (From admission, onward)     Start     Ordered   02/07/22 0326  Diet regular Room service appropriate? Yes; Fluid consistency: Thin  Diet effective now       Question Answer Comment  Room service appropriate? Yes   Fluid consistency: Thin      02/07/22 0325            DVT prophylaxis: enoxaparin (LOVENOX) injection 30 mg Start: 02/07/22 1000   Lab Results  Component Value Date   PLT 205 02/09/2022      Code Status: Full Code  Family Communication: no family at bedside   Status is: Inpatient  Remains inpatient appropriate because: ongoing symptoms  Level of care: Telemetry Medical  Consultants:  GI ID  Objective: Vitals:   02/09/22 0355 02/09/22 0732 02/09/22 0932 02/09/22 1149  BP: 116/65 (!) 141/74 (!) 116/59 94/67  Pulse: 60 69  96  Resp: '16 16  16  '$ Temp: 98 F (36.7 C) 97.7 F (36.5 C)  97.7 F (36.5 C)  TempSrc: Oral Oral  Oral  SpO2: 93% 100%  100%  Weight:      Height:       No intake or output data in the 24 hours ending 02/09/22 1158  Wt Readings from Last 3  Encounters:  02/07/22 41.5 kg  01/26/22 40.4 kg  01/16/22 41.5 kg    Examination:  Constitutional: NAD Eyes: lids and conjunctivae normal, no scleral icterus ENMT: mmm Neck: normal, supple Respiratory: clear to auscultation bilaterally, no wheezing, no crackles. Normal respiratory effort.  Cardiovascular: Regular rate and rhythm, no murmurs / rubs / gallops. No LE edema. Abdomen: soft, no distention, no tenderness. Bowel sounds positive.  Skin: no rashes Neurologic: no focal deficits, equal strength   Data Reviewed: I have independently reviewed following labs and imaging studies   CBC Recent Labs  Lab 02/06/22 1805 02/07/22 0352 02/08/22 0223 02/09/22 0534  WBC 7.0 4.7 4.1 4.1  HGB 12.0 10.6* 11.0* 10.1*  HCT 37.2 30.9* 32.2* 29.8*  PLT 248 205 179 205  MCV 95.4 93.1 92.3 92.0  MCH 30.8 31.9 31.5 31.2  MCHC 32.3 34.3 34.2 33.9  RDW 14.2 14.2 14.2 13.9  LYMPHSABS 0.5* 0.3*  --   --   MONOABS 0.4 0.4  --   --   EOSABS 0.0 0.0  --   --   BASOSABS 0.0 0.0  --   --      Recent Labs  Lab 02/06/22 1805 02/07/22 0352 02/08/22 0223 02/09/22 0534  NA 130* 133* 132* 135  K 3.7 3.5 3.4* 3.8  CL 99 103 102 105  CO2 21* 20* 22 24  GLUCOSE 103* 114* 151* 97  BUN 24* 23 16 7*  CREATININE 0.59 0.64 0.70 0.46  CALCIUM 9.2 8.5* 8.6* 8.4*  AST '23 21 26 18  '$ ALT '23 20 23 19  '$ ALKPHOS 103 83 74 70  BILITOT 0.6 0.8 0.5 0.3  ALBUMIN 3.5 2.8* 2.6* 2.4*  MG  --  1.6* 1.7 1.9     ------------------------------------------------------------------------------------------------------------------ No results for input(s): "CHOL", "HDL", "LDLCALC", "TRIG", "CHOLHDL", "LDLDIRECT" in the last 72 hours.  No results found for: "HGBA1C" ------------------------------------------------------------------------------------------------------------------ No results for input(s): "TSH", "T4TOTAL", "T3FREE", "THYROIDAB" in the last 72 hours.  Invalid input(s): "FREET3"  Cardiac  Enzymes No results for input(s): "CKMB", "TROPONINI", "MYOGLOBIN" in the last 168 hours.  Invalid input(s): "CK" ------------------------------------------------------------------------------------------------------------------    Component Value Date/Time   BNP 276.8 (H) 12/24/2021 0323    CBG: No results for input(s): "GLUCAP" in the last 168 hours.  Recent Results (from the past 240 hour(s))  Gastrointestinal Panel by PCR , Stool     Status: None   Collection Time: 02/07/22 11:46 AM   Specimen: STOOL  Result Value Ref Range Status   Campylobacter species NOT DETECTED NOT DETECTED Final   Plesimonas shigelloides NOT DETECTED NOT DETECTED Final   Salmonella species NOT DETECTED NOT DETECTED Final   Yersinia enterocolitica NOT DETECTED NOT DETECTED Final   Vibrio species NOT DETECTED NOT DETECTED Final   Vibrio cholerae NOT DETECTED NOT DETECTED Final   Enteroaggregative E coli (EAEC) NOT DETECTED NOT DETECTED Final   Enteropathogenic E coli (  EPEC) NOT DETECTED NOT DETECTED Final   Enterotoxigenic E coli (ETEC) NOT DETECTED NOT DETECTED Final   Shiga like toxin producing E coli (STEC) NOT DETECTED NOT DETECTED Final   Shigella/Enteroinvasive E coli (EIEC) NOT DETECTED NOT DETECTED Final   Cryptosporidium NOT DETECTED NOT DETECTED Final   Cyclospora cayetanensis NOT DETECTED NOT DETECTED Final   Entamoeba histolytica NOT DETECTED NOT DETECTED Final   Giardia lamblia NOT DETECTED NOT DETECTED Final   Adenovirus F40/41 NOT DETECTED NOT DETECTED Final   Astrovirus NOT DETECTED NOT DETECTED Final   Norovirus GI/GII NOT DETECTED NOT DETECTED Final   Rotavirus A NOT DETECTED NOT DETECTED Final   Sapovirus (I, II, IV, and V) NOT DETECTED NOT DETECTED Final    Comment: Performed at [facility], [address], [place], [place] [address]  C Difficile Quick Screen w PCR reflex     Status: None   Collection Time: 02/08/22  8:53 AM   Specimen: STOOL  Result Value Ref Range  Status   C Diff antigen NEGATIVE NEGATIVE Final   C Diff toxin NEGATIVE NEGATIVE Final   C Diff interpretation No C. difficile detected.  Final    Comment: Performed at [facility], [place] [address], [place], [place] [address]     Radiology Studies: No results found.   Marzetta Board, MD, PhD [facility]  Between 7 am - 7 pm I am available, please contact me via Amion (for emergencies) or Securechat (non urgent messages)  Between 7 pm - 7 am I am not available, please contact night coverage MD/APP via Amion

## 2022-02-09 NOTE — Progress Notes (Signed)
Mobility Specialist Progress Note:   02/09/22 1411  Mobility  Activity Ambulated independently in hallway  Level of Assistance Modified independent, requires aide device or extra time  Assistive Device Other (Comment) (IV Pole)  Distance Ambulated (ft) 750 ft  Activity Response Tolerated well  Mobility Referral Yes  $Mobility charge 1 Mobility   Pt received in bed and eager. No complaints. Pt left sitting EOB with all needs met and call bell in reach.   Milissa Fesperman Mobility Specialist-Acute Rehab Secure Chat only

## 2022-02-09 NOTE — Progress Notes (Signed)
      Progress Note   Subjective  Patient has been having more formed stools today. Had 3 stools today.   Objective   Vital signs in last 24 hours: Temp:  [97.7 F (36.5 C)-98 F (36.7 C)] 97.7 F (36.5 C) (10/13 1159) Pulse Rate:  [60-96] 95 (10/13 1159) Resp:  [16-18] 16 (10/13 1159) BP: (94-145)/(59-76) 98/68 (10/13 1159) SpO2:  [93 %-100 %] 100 % (10/13 1149) Last BM Date : 02/09/22 General:    white female in NAD Neurologic:  Alert and oriented,  grossly normal neurologically. Psych:  Cooperative. Normal mood and affect.  Intake/Output from previous day: No intake/output data recorded. Intake/Output this shift: Total I/O In: 24.6 [I.V.:24.6] Out: -   Lab Results: Recent Labs    02/07/22 0352 02/08/22 0223 02/09/22 0534  WBC 4.7 4.1 4.1  HGB 10.6* 11.0* 10.1*  HCT 30.9* 32.2* 29.8*  PLT 205 179 205   BMET Recent Labs    02/07/22 0352 02/08/22 0223 02/09/22 0534  NA 133* 132* 135  K 3.5 3.4* 3.8  CL 103 102 105  CO2 20* 22 24  GLUCOSE 114* 151* 97  BUN 23 16 7*  CREATININE 0.64 0.70 0.46  CALCIUM 8.5* 8.6* 8.4*   LFT Recent Labs    02/09/22 0534  PROT 4.8*  ALBUMIN 2.4*  AST 18  ALT 19  ALKPHOS 70  BILITOT 0.3   PT/INR No results for input(s): "LABPROT", "INR" in the last 72 hours.  Studies/Results: DG Abd 2 Views  Result Date: 02/07/2022 CLINICAL DATA:  Abdominal pain. EXAM: ABDOMEN - 2 VIEW COMPARISON:  Acute abdominal series 03/22/2010. Abdominal CT 01/11/2022. FINDINGS: The bowel gas pattern is nonobstructive. There is no free intraperitoneal air or suspicious abdominal calcification. Cholecystectomy clips are noted. There is multifocal costal calcification. Grossly stable peripherally calcified splenic artery aneurysm. IMPRESSION: No evidence of bowel obstruction or other acute process. Stable splenic artery aneurysm. Electronically Signed   By: Richardean Sale M.D.   On: 02/07/2022 17:50       Assessment / Plan:    86 y/o  female here for the following issues:  Recurrent diarrhea Dehydration History of recurrent C Diff  [name] C dif antigen, toxin, and PCR have all come back as negative. She was initially treated with Dificid but now that all C dif studies have come back as negative, ID has recommended discontinuing Dificid. We can monitor the patient's diarrhea to see it worsens off of the Dificid. Will continue to monitor  Dr. Loletha Carrow to assume the GI inpatient service tomorrow.  Christia Reading, MD [facility]

## 2022-02-10 DIAGNOSIS — R197 Diarrhea, unspecified: Secondary | ICD-10-CM | POA: Diagnosis not present

## 2022-02-10 DIAGNOSIS — E860 Dehydration: Secondary | ICD-10-CM | POA: Diagnosis not present

## 2022-02-10 DIAGNOSIS — A09 Infectious gastroenteritis and colitis, unspecified: Secondary | ICD-10-CM

## 2022-02-10 LAB — BASIC METABOLIC PANEL
Anion gap: 4 — ABNORMAL LOW (ref 5–15)
BUN: 10 mg/dL (ref 8–23)
CO2: 25 mmol/L (ref 22–32)
Calcium: 8.9 mg/dL (ref 8.9–10.3)
Chloride: 106 mmol/L (ref 98–111)
Creatinine, Ser: 0.6 mg/dL (ref 0.44–1.00)
GFR, Estimated: >60 mL/min (ref >60)
Glucose, Bld: 102 mg/dL — ABNORMAL HIGH (ref 70–99)
Potassium: 4.4 mmol/L (ref 3.5–5.1)
Sodium: 135 mmol/L (ref 135–145)

## 2022-02-10 LAB — MAGNESIUM: Magnesium: 1.9 mg/dL (ref 1.7–2.4)

## 2022-02-10 MED ORDER — BUDESONIDE 3 MG PO CPEP
9.0000 mg | ORAL_CAPSULE | Freq: Every day | ORAL | Status: DC
  Administered 2022-02-11 – 2022-02-12 (×2): 9 mg via ORAL
  Filled 2022-02-10 (×2): qty 3

## 2022-02-10 NOTE — Progress Notes (Signed)
PROGRESS NOTE  Julie Montgomery KDT:[id number] DOB: [birth year] DOA: 02/06/2022 PCP: Cassandria Anger, MD   LOS: 3 days   Brief Narrative / Interim history: she is known to me from back in August when she was admitted with severe diarrhea, dehydration, electrolyte imbalances.  She tested positive for C. difficile, afterwards she was placed on fidaxomicin with significant improvement in her symptoms and discharged home.  She was admitted again in September with recurrent diarrhea, and a CT scan of the abdomen and pelvis showing diffuse colonic inflammation consistent with C. difficile infection.  She improved with Dificid and sent home.  She is back in the hospital with 20+ watery bowel movements over the last several days, mild electrolyte imbalances with hypomagnesemia.  Due to recurrent symptoms ID and GI consulted  Subjective / 24h Interval events: No further diarrhea.  She has been off fidaxomicin for the past 2 days  Assesement and Plan: Principal problem Recurrent C. difficile infection-for the past 3 months she has been hospitalized 3 times.  This all started after prophylactic Keflex for scalp surgery.  She has recurrent symptoms, ID and GI following.  C. difficile is actually negative, with negative antigen.  Discussed with ID, they favor not treating.  For now monitoring off Dificid.  Diarrhea seems to have resolved, she is on probiotics.  If diarrhea does not recur could potentially go home tomorrow  Active problems Paroxysmal A-fib-the A-fib with RVR 10/13.  She was briefly on Cardizem drip and converted back to sinus rhythm.  Continue metoprolol  Hyponatremia-dehydrated due to diarrhea.  She did normal this morning  Hypokalemia-potassium normal this morning  Hypomagnesemia-mag normal this morning  Mild cognitive impairment-continue Namenda    Scheduled Meds:  enoxaparin (LOVENOX) injection  30 mg Subcutaneous Daily   fiber supplement (BANATROL TF)  60 mL Oral BID    lactose free nutrition  237 mL Oral BID BM   metoprolol succinate  25 mg Oral Daily   multivitamin with minerals  1 tablet Oral Daily   mupirocin ointment  1 Application Topical Daily   saccharomyces boulardii  250 mg Oral BID   Continuous Infusions:  diltiazem (CARDIZEM) infusion Stopped (02/09/22 1425)   PRN Meds:.acetaminophen, melatonin, prochlorperazine  Current Outpatient Medications  Medication Instructions   Align 4 mg, Oral, Daily   apixaban (ELIQUIS) 2.5 mg, 2 times daily   citalopram (CELEXA) 10 mg, Oral, Daily   furosemide (LASIX) 20 mg   memantine (NAMENDA) 5 mg   metoprolol succinate (TOPROL-XL) 25 mg   metoprolol tartrate (LOPRESSOR) 25 mg   mupirocin ointment (BACTROBAN) 2 % 1 Application, Topical, Daily   Polyethyl Glycol-Propyl Glycol (SYSTANE OP) 1 drop, Both Eyes, Daily   VITAMIN D PO 1 tablet, Oral, Daily    Diet Orders (From admission, onward)     Start     Ordered   02/07/22 0326  Diet regular Room service appropriate? Yes; Fluid consistency: Thin  Diet effective now       Question Answer Comment  Room service appropriate? Yes   Fluid consistency: Thin      02/07/22 0325            DVT prophylaxis: enoxaparin (LOVENOX) injection 30 mg Start: 02/07/22 1000   Lab Results  Component Value Date   PLT 205 02/09/2022      Code Status: Full Code  Family Communication: Mother at bedside  Status is: Inpatient  Remains inpatient appropriate because: Monitoring for diarrhea recurrence  Level of care: Telemetry Medical  Consultants:  GI ID  Objective: Vitals:   02/09/22 1930 02/09/22 2302 02/10/22 0353 02/10/22 0835  BP: 126/68 128/66 114/63 (!) 106/96  Pulse: 65 (!) 52 (!) 50 (!) 52  Resp: '20 20 20 16  '$ Temp:  97.8 F (36.6 C) 97.8 F (36.6 C) 97.9 F (36.6 C)  TempSrc: Oral Oral Oral   SpO2: 99% 97% 99% 100%  Weight:      Height:        Intake/Output Summary (Last 24 hours) at 02/10/2022 1149 Last data filed at 02/09/2022  1600 Gross per 24 hour  Intake 24.6 ml  Output --  Net 24.6 ml    Wt Readings from Last 3 Encounters:  02/07/22 41.5 kg  01/26/22 40.4 kg  01/16/22 41.5 kg    Examination:  Constitutional: NAD Eyes: lids and conjunctivae normal, no scleral icterus ENMT: mmm Neck: normal, supple Respiratory: clear to auscultation bilaterally, no wheezing, no crackles. Normal respiratory effort.  Cardiovascular: Regular rate and rhythm, no murmurs / rubs / gallops. No LE edema. Abdomen: soft, no distention, no tenderness. Bowel sounds positive.  Skin: no rashes Neurologic: no focal deficits, equal strength   Data Reviewed: I have independently reviewed following labs and imaging studies   CBC Recent Labs  Lab 02/06/22 1805 02/07/22 0352 02/08/22 0223 02/09/22 0534  WBC 7.0 4.7 4.1 4.1  HGB 12.0 10.6* 11.0* 10.1*  HCT 37.2 30.9* 32.2* 29.8*  PLT 248 205 179 205  MCV 95.4 93.1 92.3 92.0  MCH 30.8 31.9 31.5 31.2  MCHC 32.3 34.3 34.2 33.9  RDW 14.2 14.2 14.2 13.9  LYMPHSABS 0.5* 0.3*  --   --   MONOABS 0.4 0.4  --   --   EOSABS 0.0 0.0  --   --   BASOSABS 0.0 0.0  --   --      Recent Labs  Lab 02/06/22 1805 02/07/22 0352 02/08/22 0223 02/09/22 0534 02/10/22 0435  NA 130* 133* 132* 135 135  K 3.7 3.5 3.4* 3.8 4.4  CL 99 103 102 105 106  CO2 21* 20* '22 24 25  '$ GLUCOSE 103* 114* 151* 97 102*  BUN 24* 23 16 7* 10  CREATININE 0.59 0.64 0.70 0.46 0.60  CALCIUM 9.2 8.5* 8.6* 8.4* 8.9  AST '23 21 26 18  '$ --   ALT '23 20 23 19  '$ --   ALKPHOS 103 83 74 70  --   BILITOT 0.6 0.8 0.5 0.3  --   ALBUMIN 3.5 2.8* 2.6* 2.4*  --   MG  --  1.6* 1.7 1.9 1.9     ------------------------------------------------------------------------------------------------------------------ No results for input(s): "CHOL", "HDL", "LDLCALC", "TRIG", "CHOLHDL", "LDLDIRECT" in the last 72 hours.  No results found for:  "HGBA1C" ------------------------------------------------------------------------------------------------------------------ No results for input(s): "TSH", "T4TOTAL", "T3FREE", "THYROIDAB" in the last 72 hours.  Invalid input(s): "FREET3"  Cardiac Enzymes No results for input(s): "CKMB", "TROPONINI", "MYOGLOBIN" in the last 168 hours.  Invalid input(s): "CK" ------------------------------------------------------------------------------------------------------------------    Component Value Date/Time   BNP 276.8 (H) 12/24/2021 0323    CBG: No results for input(s): "GLUCAP" in the last 168 hours.  Recent Results (from the past 240 hour(s))  Gastrointestinal Panel by PCR , Stool     Status: None   Collection Time: 02/07/22 11:46 AM   Specimen: STOOL  Result Value Ref Range Status   Campylobacter species NOT DETECTED NOT DETECTED Final   Plesimonas shigelloides NOT DETECTED NOT DETECTED Final   Salmonella species NOT DETECTED NOT DETECTED Final  Yersinia enterocolitica NOT DETECTED NOT DETECTED Final   Vibrio species NOT DETECTED NOT DETECTED Final   Vibrio cholerae NOT DETECTED NOT DETECTED Final   Enteroaggregative E coli (EAEC) NOT DETECTED NOT DETECTED Final   Enteropathogenic E coli (EPEC) NOT DETECTED NOT DETECTED Final   Enterotoxigenic E coli (ETEC) NOT DETECTED NOT DETECTED Final   Shiga like toxin producing E coli (STEC) NOT DETECTED NOT DETECTED Final   Shigella/Enteroinvasive E coli (EIEC) NOT DETECTED NOT DETECTED Final   Cryptosporidium NOT DETECTED NOT DETECTED Final   Cyclospora cayetanensis NOT DETECTED NOT DETECTED Final   Entamoeba histolytica NOT DETECTED NOT DETECTED Final   Giardia lamblia NOT DETECTED NOT DETECTED Final   Adenovirus F40/41 NOT DETECTED NOT DETECTED Final   Astrovirus NOT DETECTED NOT DETECTED Final   Norovirus GI/GII NOT DETECTED NOT DETECTED Final   Rotavirus A NOT DETECTED NOT DETECTED Final   Sapovirus (I, II, IV, and V) NOT DETECTED  NOT DETECTED Final    Comment: Performed at [facility], [address] [place], [place] [address]  C Difficile Quick Screen w PCR reflex     Status: None   Collection Time: 02/08/22  8:53 AM   Specimen: STOOL  Result Value Ref Range Status   C Diff antigen NEGATIVE NEGATIVE Final   C Diff toxin NEGATIVE NEGATIVE Final   C Diff interpretation No C. difficile detected.  Final    Comment: Performed at [facility], [place] [address] [place], [place] [address]     Radiology Studies: No results found.   Marzetta Board, MD, PhD [facility]  Between 7 am - 7 pm I am available, please contact me via Amion (for emergencies) or Securechat (non urgent messages)  Between 7 pm - 7 am I am not available, please contact night coverage MD/APP via Amion

## 2022-02-10 NOTE — Progress Notes (Signed)
Mobility Specialist Progress Note:   02/10/22 1446  Mobility  Activity Ambulated independently in hallway  Level of Assistance Independent  Assistive Device None  Distance Ambulated (ft) 500 ft  Activity Response Tolerated well  Mobility Referral Yes  $Mobility charge 1 Mobility   Pt received in bed and agreeable. No complaints. Pt left sitting EOB with all needs met and call bell in reach.   Alvis Pulcini Mobility Specialist-Acute Rehab Secure Chat only

## 2022-02-10 NOTE — Progress Notes (Addendum)
Daily Progress Note  Hospital Day: 5  Chief Complaint: diarrhea  Brief History  86 y.o. female with PMH of recurrent C. difficile infections, Bronchiectasis.  Nonmelanoma skin cancer. PAF. Anxiety/depression.  Mild dementia.  Surgeries include appendectomy, right breast lumpectomy, cataract surgery, C-section x1, cholecystectomy Seen in consultation by [place] on  10/11 for recurrent diarrhea after 2 recent course of Dificid for C diff Ag positive, toxin negative, PCR positive   Assessment / Plan   # 86 yo female with recurrent diarrhea after two -three recent courses of Dificid for C-diff . In Sept she was C-diff Ag positive, toxin negative, PCR positive and CT scan showed diffuse colitis. She gets better with Dificid but then diarrhea recurs.    GI pathogen panel negative. C-diff antigen, C-diff toxin and toxigenic PCR are all negative. ID following and has discontinued Dificid.  Today she has had only one BM, partially formed but is scared that diarrhea will reoccur upon discharge.   If diarrhea does reoccur then we should see her in the office to evaluate for any other causes of diarrhea.    # PAF, on Eliquis  # History of adenomatous colon polyps in 2020  Subjective   Feels okay. No diarrhea today  Objective  Imaging:  DG Abd 2 Views  Result Date: 02/07/2022 CLINICAL DATA:  Abdominal pain. EXAM: ABDOMEN - 2 VIEW COMPARISON:  Acute abdominal series 03/22/2010. Abdominal CT 01/11/2022. FINDINGS: The bowel gas pattern is nonobstructive. There is no free intraperitoneal air or suspicious abdominal calcification. Cholecystectomy clips are noted. There is multifocal costal calcification. Grossly stable peripherally calcified splenic artery aneurysm. IMPRESSION: No evidence of bowel obstruction or other acute process. Stable splenic artery aneurysm. Electronically Signed   By: Richardean Sale M.D.   On: 02/07/2022 17:50    Lab Results: Recent Labs    02/08/22 0223 02/09/22 0534   WBC 4.1 4.1  HGB 11.0* 10.1*  HCT 32.2* 29.8*  PLT 179 205   BMET Recent Labs    02/08/22 0223 02/09/22 0534 02/10/22 0435  NA 132* 135 135  K 3.4* 3.8 4.4  CL 102 105 106  CO2 '22 24 25  '$ GLUCOSE 151* 97 102*  BUN 16 7* 10  CREATININE 0.70 0.46 0.60  CALCIUM 8.6* 8.4* 8.9   LFT Recent Labs    02/09/22 0534  PROT 4.8*  ALBUMIN 2.4*  AST 18  ALT 19  ALKPHOS 70  BILITOT 0.3   PT/INR No results for input(s): "LABPROT", "INR" in the last 72 hours.   Scheduled inpatient medications:   enoxaparin (LOVENOX) injection  30 mg Subcutaneous Daily   fiber supplement (BANATROL TF)  60 mL Oral BID   lactose free nutrition  237 mL Oral BID BM   metoprolol succinate  25 mg Oral Daily   multivitamin with minerals  1 tablet Oral Daily   mupirocin ointment  1 Application Topical Daily   saccharomyces boulardii  250 mg Oral BID   Continuous inpatient infusions:   diltiazem (CARDIZEM) infusion Stopped (02/09/22 1425)   PRN inpatient medications: acetaminophen, melatonin, prochlorperazine  Vital signs in last 24 hours: Temp:  [97.8 F (36.6 C)-98 F (36.7 C)] 98 F (36.7 C) (10/14 1200) Pulse Rate:  [50-65] 65 (10/14 1200) Resp:  [16-20] 16 (10/14 1200) BP: (106-128)/(60-96) 106/60 (10/14 1200) SpO2:  [95 %-100 %] 95 % (10/14 1200) Last BM Date : 02/09/22  Intake/Output Summary (Last 24 hours) at 02/10/2022 1536 Last data filed at 02/09/2022 1600 Gross  per 24 hour  Intake 24.6 ml  Output --  Net 24.6 ml    Intake/Output from previous day: 10/13 0701 - 10/14 0700 In: 24.6 [I.V.:24.6] Out: -  Intake/Output this shift: No intake/output data recorded.   Physical Exam:  General: Alert thin female in NAD Heart:  Regular rate and rhythm. No lower extremity edema Pulmonary: Normal respiratory effort Abdomen: Soft, nondistended, nontender. Normal bowel sounds.  Neurologic: Alert and oriented Psych: Pleasant. Cooperative.    Principal Problem:   Diarrhea Active  Problems:   Dehydration     LOS: 3 days   Julie Montgomery ,NP 02/10/2022, 3:36 PM   I have taken an interval history, thoroughly reviewed the chart and examined the patient. I agree with the Advanced Practitioner's note, impression and recommendations, and have recorded additional findings, impressions and recommendations below. I performed a substantive portion of this encounter (>50% time spent), including a complete performance of the medical decision making.  My additional thoughts are as follows:  Signout received from colleagues, extensive chart review performed, patient seen and examined with her husband at the bedside.  I found it difficult to get a clear and consistent description of any ongoing diarrhea she may have, as she is quite exasperated and anxious about this entire affair.  Her husband feels that the stool has significantly decreased in frequency, although it is still loose.  She has some generalized abdominal discomfort, but is tolerating solid food well. She certainly looks chronically ill and quite thin.  C. difficile negative on studies this admission, though she clearly has had recurrent C. difficile in recent months and was troubled by this on more than one occasion in the last few years.  She has felt that she needs to see a specialist for fecal microbiota transplant, but that is not currently indicated with negative stool studies.  If she should have a recurrence and stool studies indicate recurrent infection, then I could certainly be entertained in the outpatient setting.  I did my best to explain to her that she still has colitis, even if the infectious component has been cleared with antibiotics.  To hasten that improvement, I recommended she start Entocort 9 mg once daily.  I explained that is a steroid with only 1% bioavailability after clearance by the liver, thus very little chance of steroid or other side effects.  She was apprehensive but ultimately  agreeable.  I will start that medication, she can be discharged home from a GI perspective.  Antidiarrheals as needed, and we will message Dr. Fuller Plan to arrange outpatient follow-up and tapering of the budesonide depending on her clinical progress.  [name] III Office:[phone]

## 2022-02-11 DIAGNOSIS — A09 Infectious gastroenteritis and colitis, unspecified: Secondary | ICD-10-CM | POA: Diagnosis not present

## 2022-02-11 LAB — CBC
HCT: 36.4 % (ref 36.0–46.0)
Hemoglobin: 12 g/dL (ref 12.0–15.0)
MCH: 30.8 pg (ref 26.0–34.0)
MCHC: 33 g/dL (ref 30.0–36.0)
MCV: 93.6 fL (ref 80.0–100.0)
Platelets: 277 10*3/uL (ref 150–400)
RBC: 3.89 MIL/uL (ref 3.87–5.11)
RDW: 14.2 % (ref 11.5–15.5)
WBC: 7.4 10*3/uL (ref 4.0–10.5)
nRBC: 0 % (ref 0.0–0.2)

## 2022-02-11 LAB — BASIC METABOLIC PANEL
Anion gap: 5 (ref 5–15)
BUN: 10 mg/dL (ref 8–23)
CO2: 28 mmol/L (ref 22–32)
Calcium: 9.4 mg/dL (ref 8.9–10.3)
Chloride: 104 mmol/L (ref 98–111)
Creatinine, Ser: 0.62 mg/dL (ref 0.44–1.00)
GFR, Estimated: >60 mL/min (ref >60)
Glucose, Bld: 96 mg/dL (ref 70–99)
Potassium: 4.5 mmol/L (ref 3.5–5.1)
Sodium: 137 mmol/L (ref 135–145)

## 2022-02-11 MED ORDER — DICYCLOMINE HCL 10 MG PO CAPS
10.0000 mg | ORAL_CAPSULE | Freq: Three times a day (TID) | ORAL | Status: DC
  Administered 2022-02-11 – 2022-02-12 (×3): 10 mg via ORAL
  Filled 2022-02-11 (×3): qty 1

## 2022-02-11 NOTE — Progress Notes (Addendum)
PROGRESS NOTE  Julie Montgomery NPY:[id number] DOB: Oct 16, 1935 DOA: 02/06/2022 PCP: Cassandria Anger, MD   LOS: 4 days   Brief Narrative / Interim history: she is known to me from back in August when she was admitted with severe diarrhea, dehydration, electrolyte imbalances.  She tested positive for C. difficile, afterwards she was placed on fidaxomicin with significant improvement in her symptoms and discharged home.  She was admitted again in September with recurrent diarrhea, and a CT scan of the abdomen and pelvis showing diffuse colonic inflammation consistent with C. difficile infection.  She improved with Dificid and sent home.  She is back in the hospital with 20+ watery bowel movements over the last several days, mild electrolyte imbalances with hypomagnesemia.  Due to recurrent symptoms ID and GI consulted  Subjective / 24h Interval events: Very concerned and anxious this morning.  Complains of increased abdominal pain and cramping, nausea, and already had 3 bowel movements today.  They are not watery but definitely loose.  Assesement and Plan: Principal problem Recurrent C. difficile infection-for the past 3 months she has been hospitalized 3 times.  This all started after prophylactic Keflex for scalp surgery.  She has recurrent symptoms, ID and GI following.  C. difficile is actually negative, with negative antigen.  Discussed with ID, they favor not treating.  For now monitoring off Dificid.  Continue probiotics, she was started on Entocort by GI on 10/14 -Increased cramping and appears that she has had increased BMs today.  Closely monitor.  Started on Bentyl per GI.  Discussed with Dr. Loletha Carrow  Active problems Paroxysmal A-fib-the A-fib with RVR 10/13.  She was briefly on Cardizem drip and converted back to sinus rhythm.  Continue metoprolol.  Remains in sinus this morning  Hyponatremia-dehydrated due to diarrhea.  Sodium normalized this morning  Hypokalemia-is normal  today  Hypomagnesemia-repeat magnesium tomorrow  Mild cognitive impairment-continue Namenda    Scheduled Meds:  budesonide  9 mg Oral Daily   dicyclomine  10 mg Oral TID AC   enoxaparin (LOVENOX) injection  30 mg Subcutaneous Daily   fiber supplement (BANATROL TF)  60 mL Oral BID   lactose free nutrition  237 mL Oral BID BM   metoprolol succinate  25 mg Oral Daily   multivitamin with minerals  1 tablet Oral Daily   mupirocin ointment  1 Application Topical Daily   saccharomyces boulardii  250 mg Oral BID   Continuous Infusions:  diltiazem (CARDIZEM) infusion Stopped (02/09/22 1425)   PRN Meds:.acetaminophen, melatonin, prochlorperazine  Current Outpatient Medications  Medication Instructions   Align 4 mg, Oral, Daily   apixaban (ELIQUIS) 2.5 mg, 2 times daily   citalopram (CELEXA) 10 mg, Oral, Daily   furosemide (LASIX) 20 mg   memantine (NAMENDA) 5 mg   metoprolol succinate (TOPROL-XL) 25 mg   metoprolol tartrate (LOPRESSOR) 25 mg   mupirocin ointment (BACTROBAN) 2 % 1 Application, Topical, Daily   Polyethyl Glycol-Propyl Glycol (SYSTANE OP) 1 drop, Both Eyes, Daily   VITAMIN D PO 1 tablet, Oral, Daily    Diet Orders (From admission, onward)     Start     Ordered   02/07/22 0326  Diet regular Room service appropriate? Yes; Fluid consistency: Thin  Diet effective now       Question Answer Comment  Room service appropriate? Yes   Fluid consistency: Thin      02/07/22 0325            DVT prophylaxis: enoxaparin (LOVENOX) injection  30 mg Start: 02/07/22 1000   Lab Results  Component Value Date   PLT 277 02/11/2022      Code Status: Full Code  Family Communication: Husband at bedside  Status is: Inpatient  Remains inpatient appropriate because: Monitoring for diarrhea recurrence  Level of care: Telemetry Medical  Consultants:  GI ID  Objective: Vitals:   02/10/22 1937 02/10/22 2308 02/11/22 0057 02/11/22 0613  BP: 139/80 (!) 142/72 (!) 150/82  (!) 148/78  Pulse: 65 (!) 52 (!) 57 (!) 54  Resp: '18 18 18 18  '$ Temp: 98 F (36.7 C) 98 F (36.7 C) 98.4 F (36.9 C) 97.9 F (36.6 C)  TempSrc: Oral Oral Oral Oral  SpO2: 95% 95% 98% 98%  Weight:      Height:       No intake or output data in the 24 hours ending 02/11/22 1053  Wt Readings from Last 3 Encounters:  02/07/22 41.5 kg  01/26/22 40.4 kg  01/16/22 41.5 kg    Examination:  Constitutional: NAD Eyes: lids and conjunctivae normal, no scleral icterus ENMT: mmm Neck: normal, supple Respiratory: clear to auscultation bilaterally, no wheezing, no crackles. Normal respiratory effort.  Cardiovascular: Regular rate and rhythm, no murmurs / rubs / gallops. No LE edema. Abdomen: soft, no distention, no tenderness. Bowel sounds positive.  Skin: no rashes Neurologic: no focal deficits, equal strength   Data Reviewed: I have independently reviewed following labs and imaging studies   CBC Recent Labs  Lab 02/06/22 1805 02/07/22 0352 02/08/22 0223 02/09/22 0534 02/11/22 0349  WBC 7.0 4.7 4.1 4.1 7.4  HGB 12.0 10.6* 11.0* 10.1* 12.0  HCT 37.2 30.9* 32.2* 29.8* 36.4  PLT 248 205 179 205 277  MCV 95.4 93.1 92.3 92.0 93.6  MCH 30.8 31.9 31.5 31.2 30.8  MCHC 32.3 34.3 34.2 33.9 33.0  RDW 14.2 14.2 14.2 13.9 14.2  LYMPHSABS 0.5* 0.3*  --   --   --   MONOABS 0.4 0.4  --   --   --   EOSABS 0.0 0.0  --   --   --   BASOSABS 0.0 0.0  --   --   --      Recent Labs  Lab 02/06/22 1805 02/07/22 0352 02/08/22 0223 02/09/22 0534 02/10/22 0435 02/11/22 0349  NA 130* 133* 132* 135 135 137  K 3.7 3.5 3.4* 3.8 4.4 4.5  CL 99 103 102 105 106 104  CO2 21* 20* '22 24 25 28  '$ GLUCOSE 103* 114* 151* 97 102* 96  BUN 24* 23 16 7* 10 10  CREATININE 0.59 0.64 0.70 0.46 0.60 0.62  CALCIUM 9.2 8.5* 8.6* 8.4* 8.9 9.4  AST '23 21 26 18  '$ --   --   ALT '23 20 23 19  '$ --   --   ALKPHOS 103 83 74 70  --   --   BILITOT 0.6 0.8 0.5 0.3  --   --   ALBUMIN 3.5 2.8* 2.6* 2.4*  --   --   MG  --   1.6* 1.7 1.9 1.9  --      ------------------------------------------------------------------------------------------------------------------ No results for input(s): "CHOL", "HDL", "LDLCALC", "TRIG", "CHOLHDL", "LDLDIRECT" in the last 72 hours.  No results found for: "HGBA1C" ------------------------------------------------------------------------------------------------------------------ No results for input(s): "TSH", "T4TOTAL", "T3FREE", "THYROIDAB" in the last 72 hours.  Invalid input(s): "FREET3"  Cardiac Enzymes No results for input(s): "CKMB", "TROPONINI", "MYOGLOBIN" in the last 168 hours.  Invalid input(s): "CK" ------------------------------------------------------------------------------------------------------------------    Component Value  Date/Time   BNP 276.8 (H) 12/24/2021 0323    CBG: No results for input(s): "GLUCAP" in the last 168 hours.  Recent Results (from the past 240 hour(s))  Gastrointestinal Panel by PCR , Stool     Status: None   Collection Time: 02/07/22 11:46 AM   Specimen: STOOL  Result Value Ref Range Status   Campylobacter species NOT DETECTED NOT DETECTED Final   Plesimonas shigelloides NOT DETECTED NOT DETECTED Final   Salmonella species NOT DETECTED NOT DETECTED Final   Yersinia enterocolitica NOT DETECTED NOT DETECTED Final   Vibrio species NOT DETECTED NOT DETECTED Final   Vibrio cholerae NOT DETECTED NOT DETECTED Final   Enteroaggregative E coli (EAEC) NOT DETECTED NOT DETECTED Final   Enteropathogenic E coli (EPEC) NOT DETECTED NOT DETECTED Final   Enterotoxigenic E coli (ETEC) NOT DETECTED NOT DETECTED Final   Shiga like toxin producing E coli (STEC) NOT DETECTED NOT DETECTED Final   Shigella/Enteroinvasive E coli (EIEC) NOT DETECTED NOT DETECTED Final   Cryptosporidium NOT DETECTED NOT DETECTED Final   Cyclospora cayetanensis NOT DETECTED NOT DETECTED Final   Entamoeba histolytica NOT DETECTED NOT DETECTED Final   Giardia lamblia  NOT DETECTED NOT DETECTED Final   Adenovirus F40/41 NOT DETECTED NOT DETECTED Final   Astrovirus NOT DETECTED NOT DETECTED Final   Norovirus GI/GII NOT DETECTED NOT DETECTED Final   Rotavirus A NOT DETECTED NOT DETECTED Final   Sapovirus (I, II, IV, and V) NOT DETECTED NOT DETECTED Final    Comment: Performed at [facility], [address] [place], [place] [address]  C Difficile Quick Screen w PCR reflex     Status: None   Collection Time: 02/08/22  8:53 AM   Specimen: STOOL  Result Value Ref Range Status   C Diff antigen NEGATIVE NEGATIVE Final   C Diff toxin NEGATIVE NEGATIVE Final   C Diff interpretation No C. difficile detected.  Final    Comment: Performed at [facility], [place] [address], [place], [place] [address]     Radiology Studies: No results found.   Marzetta Board, MD, PhD [facility]  Between 7 am - 7 pm I am available, please contact me via Amion (for emergencies) or Securechat (non urgent messages)  Between 7 pm - 7 am I am not available, please contact night coverage MD/APP via Amion

## 2022-02-11 NOTE — Progress Notes (Signed)
Mobility Specialist Progress Note:   02/11/22 1144  Mobility  Activity Ambulated independently in hallway  Level of Assistance Independent  Assistive Device None  Distance Ambulated (ft) 100 ft  Activity Response Tolerated well  Mobility Referral Yes  $Mobility charge 1 Mobility   Pt received ambulating in hallway with husband. No complaints. Pt left ambulating in hallway with husband and all needs met.   Julie Montgomery Mobility Specialist-Acute Rehab Secure Chat only

## 2022-02-11 NOTE — Progress Notes (Addendum)
ID Brief Note  Patient not seen, chart reviewed  Fidaxomicin dc'ed 10/12 after negative C diff test  Diarrhea seemed to have been improving until today ( abdominal cramping, nausea and 3 loose, non watery Bms today)   Remains afebrile, no leukocytosis CBC and CMP WNL  02/07/22 GI Panel negative 10/12 C diff antigen as well as toxin negative   Started on Entocort yesterday by GI. GI following  ID will sign off. Please recall if needed.   Rosiland Oz, MD Infectious Disease Physician [facility] [address] [place], [place] [address] Phone: [phone]  Fax: [phone]

## 2022-02-12 ENCOUNTER — Other Ambulatory Visit (HOSPITAL_COMMUNITY): Payer: Self-pay

## 2022-02-12 ENCOUNTER — Telehealth: Payer: Self-pay

## 2022-02-12 DIAGNOSIS — R197 Diarrhea, unspecified: Secondary | ICD-10-CM | POA: Diagnosis not present

## 2022-02-12 LAB — CBC
HCT: 36.1 % (ref 36.0–46.0)
Hemoglobin: 12.3 g/dL (ref 12.0–15.0)
MCH: 31.1 pg (ref 26.0–34.0)
MCHC: 34.1 g/dL (ref 30.0–36.0)
MCV: 91.2 fL (ref 80.0–100.0)
Platelets: 305 10*3/uL (ref 150–400)
RBC: 3.96 MIL/uL (ref 3.87–5.11)
RDW: 13.9 % (ref 11.5–15.5)
WBC: 9.5 10*3/uL (ref 4.0–10.5)
nRBC: 0 % (ref 0.0–0.2)

## 2022-02-12 LAB — BASIC METABOLIC PANEL
Anion gap: 9 (ref 5–15)
BUN: 8 mg/dL (ref 8–23)
CO2: 27 mmol/L (ref 22–32)
Calcium: 9.5 mg/dL (ref 8.9–10.3)
Chloride: 101 mmol/L (ref 98–111)
Creatinine, Ser: 0.65 mg/dL (ref 0.44–1.00)
GFR, Estimated: >60 mL/min (ref >60)
Glucose, Bld: 104 mg/dL — ABNORMAL HIGH (ref 70–99)
Potassium: 4 mmol/L (ref 3.5–5.1)
Sodium: 137 mmol/L (ref 135–145)

## 2022-02-12 LAB — MAGNESIUM: Magnesium: 1.9 mg/dL (ref 1.7–2.4)

## 2022-02-12 MED ORDER — DICYCLOMINE HCL 10 MG PO CAPS
10.0000 mg | ORAL_CAPSULE | Freq: Three times a day (TID) | ORAL | 0 refills | Status: DC
  Filled 2022-02-12: qty 90, 30d supply, fill #0

## 2022-02-12 MED ORDER — BUDESONIDE 3 MG PO CPEP: 9.0000 mg | ORAL_CAPSULE | Freq: Every day | ORAL | 0 refills | Status: DC

## 2022-02-12 MED ORDER — BUDESONIDE 3 MG PO CPEP
9.0000 mg | ORAL_CAPSULE | Freq: Every day | ORAL | 0 refills | Status: DC
  Filled 2022-02-12: qty 90, 30d supply, fill #0

## 2022-02-12 MED ORDER — SACCHAROMYCES BOULARDII 250 MG PO CAPS
250.0000 mg | ORAL_CAPSULE | Freq: Two times a day (BID) | ORAL | 0 refills | Status: DC
  Filled 2022-02-12: qty 20, 10d supply, fill #0

## 2022-02-12 NOTE — Discharge Summary (Signed)
Physician Discharge Summary  Julie Montgomery TDD:[id number] DOB: 09-05-1935 DOA: 02/06/2022  PCP: Cassandria Anger, MD  Admit date: 02/06/2022 Discharge date: 02/12/2022  Admitted From: home Disposition:  home  Recommendations for Outpatient Follow-up:  Follow up with GI in 1-2 weeks Please obtain BMP/CBC in one week  Home Health: none Equipment/Devices: none  Discharge Condition: stable CODE STATUS: Full code Diet Orders (From admission, onward)     Start     Ordered   02/07/22 0326  Diet regular Room service appropriate? Yes; Fluid consistency: Thin  Diet effective now       Question Answer Comment  Room service appropriate? Yes   Fluid consistency: Thin      02/07/22 0325           HPI: Per admitting MD, Julie Montgomery is a 86 y.o. female with medical history significant for mild dementia, not on any prescribed oral medication per her husband at bedside, recurrent C. difficile infection x6, who presented to [facility] ED from home due to progressively worsening diarrhea associated with abdominal cramping with defecation.  Endorses she woke up covered in watery stools with incontinence, similar to prior C. difficile infection for which she was admitted last month.  Denies nausea or vomiting.  Reports decrease in appetite due to diarrhea. In the ED, her diarrhea persisted.  Lab studies were remarkable for electrolytes abnormalities.  UA positive for pyuria.  Due to concern for UTI and recurrent C. difficile infection with diarrhea illness, she was started on Rocephin and IV Flagyl.  EDP requested admission for further management.  Hospital Course / Discharge diagnoses: Principal problem Recurrent C. difficile infection-for the past 3 months she has been hospitalized 3 times.  This all started after prophylactic Keflex for scalp surgery.  She has recurrent symptoms, ID and GI following.  C. difficile is actually negative, with negative antigen.  Discussed with ID, they favor  not treating.  Possible show has postinfectious colitis.  GI started patient on Bentyl L as well as probiotics, and Entocort.  She has improved, diarrhea improving, is tolerating a regular diet and will be discharged home in stable condition.  She will need follow-up with GI clinic.  Instructed the patient that if her diarrhea returns, she needs to call the GI clinic immediately, hopefully will avoid further hospitalizations with this    Active problems Paroxysmal A-fib-went into A-fib with RVR 10/13.  She was briefly on Cardizem drip and converted back to sinus rhythm.  Continue home medications on discharge  Hyponatremia-dehydrated due to diarrhea.  Sodium normalized Hypokalemia-potassium normalized Hypomagnesemia-magnesium normalized Mild cognitive impairment-continue Namenda  Sepsis ruled out   Discharge Instructions   Allergies as of 02/12/2022       Reactions   Lovastatin Other (See Comments)   Unknown reaction   Mometasone Furo-formoterol Fum Other (See Comments)   Loss of appetite, laryngitis    Prozac [fluoxetine Hcl] Other (See Comments)   Jumpy   Sulfa Antibiotics Other (See Comments)   Unknown reaction        Medication List     STOP taking these medications    furosemide 20 MG tablet Commonly known as: LASIX   metoprolol tartrate 25 MG tablet Commonly known as: LOPRESSOR       TAKE these medications    Align 4 MG Caps Take 1 capsule (4 mg total) by mouth daily.   budesonide 3 MG 24 hr capsule Commonly known as: ENTOCORT EC Take 3 capsules (9 mg total)  by mouth daily.   citalopram 10 MG tablet Commonly known as: CeleXA Take 1 tablet (10 mg total) by mouth daily.   dicyclomine 10 MG capsule Commonly known as: BENTYL Take 1 capsule (10 mg total) by mouth 3 (three) times daily before meals.   Eliquis 2.5 MG Tabs tablet Generic drug: apixaban Take 2.5 mg by mouth 2 (two) times daily.   memantine 5 MG tablet Commonly known as: NAMENDA Take 5  mg by mouth.   metoprolol succinate 25 MG 24 hr tablet Commonly known as: TOPROL-XL Take 25 mg by mouth.   mupirocin ointment 2 % Commonly known as: BACTROBAN Apply 1 Application topically daily.   saccharomyces boulardii 250 MG capsule Commonly known as: FLORASTOR Take 1 capsule (250 mg total) by mouth 2 (two) times daily.   SYSTANE OP Place 1 drop into both eyes daily.   VITAMIN D PO Take 1 tablet by mouth daily.        Consultations: ID GI  Procedures/Studies:  DG Abd 2 Views  Result Date: 02/07/2022 CLINICAL DATA:  Abdominal pain. EXAM: ABDOMEN - 2 VIEW COMPARISON:  Acute abdominal series 03/22/2010. Abdominal CT 01/11/2022. FINDINGS: The bowel gas pattern is nonobstructive. There is no free intraperitoneal air or suspicious abdominal calcification. Cholecystectomy clips are noted. There is multifocal costal calcification. Grossly stable peripherally calcified splenic artery aneurysm. IMPRESSION: No evidence of bowel obstruction or other acute process. Stable splenic artery aneurysm. Electronically Signed   By: Richardean Sale M.D.   On: 02/07/2022 17:50   DG Chest 2 View  Result Date: 01/29/2022 CLINICAL DATA:  Dyspnea.  The history of pneumonia. EXAM: CHEST - 2 VIEW COMPARISON:  12/24/2021, chest radiographs and chest CT. FINDINGS: Cardiac silhouette is normal in size. No mediastinal or hilar masses. No evidence of adenopathy. Lungs are hyperexpanded. Prominent bronchovascular and interstitial markings. No evidence of pneumonia or pulmonary edema. No pleural effusion or pneumothorax. Skeletal structures are intact. IMPRESSION: 1. No acute cardiopulmonary disease. Electronically Signed   By: Lajean Manes M.D.   On: 01/29/2022 13:51     Subjective: - no chest pain, shortness of breath, no abdominal pain, nausea or vomiting.   Discharge Exam: BP (!) 145/72 (BP Location: Right Arm)   Pulse 65   Temp 98.3 F (36.8 C) (Oral)   Resp 15   Ht '5\' 2"'$  (1.575 m)   Wt 41.5  kg   SpO2 100%   BMI 16.73 kg/m   General: Pt is alert, awake, not in acute distress Cardiovascular: RRR, S1/S2 +, no rubs, no gallops Respiratory: CTA bilaterally, no wheezing, no rhonchi Abdominal: Soft, NT, ND, bowel sounds + Extremities: no edema, no cyanosis   The results of significant diagnostics from this hospitalization (including imaging, microbiology, ancillary and laboratory) are listed below for reference.     Microbiology: Recent Results (from the past 240 hour(s))  Gastrointestinal Panel by PCR , Stool     Status: None   Collection Time: 02/07/22 11:46 AM   Specimen: STOOL  Result Value Ref Range Status   Campylobacter species NOT DETECTED NOT DETECTED Final   Plesimonas shigelloides NOT DETECTED NOT DETECTED Final   Salmonella species NOT DETECTED NOT DETECTED Final   Yersinia enterocolitica NOT DETECTED NOT DETECTED Final   Vibrio species NOT DETECTED NOT DETECTED Final   Vibrio cholerae NOT DETECTED NOT DETECTED Final   Enteroaggregative E coli (EAEC) NOT DETECTED NOT DETECTED Final   Enteropathogenic E coli (EPEC) NOT DETECTED NOT DETECTED Final   Enterotoxigenic E  coli (ETEC) NOT DETECTED NOT DETECTED Final   Shiga like toxin producing E coli (STEC) NOT DETECTED NOT DETECTED Final   Shigella/Enteroinvasive E coli (EIEC) NOT DETECTED NOT DETECTED Final   Cryptosporidium NOT DETECTED NOT DETECTED Final   Cyclospora cayetanensis NOT DETECTED NOT DETECTED Final   Entamoeba histolytica NOT DETECTED NOT DETECTED Final   Giardia lamblia NOT DETECTED NOT DETECTED Final   Adenovirus F40/41 NOT DETECTED NOT DETECTED Final   Astrovirus NOT DETECTED NOT DETECTED Final   Norovirus GI/GII NOT DETECTED NOT DETECTED Final   Rotavirus A NOT DETECTED NOT DETECTED Final   Sapovirus (I, II, IV, and V) NOT DETECTED NOT DETECTED Final    Comment: Performed at [facility], [address] [place], [place] [address]  C Difficile Quick Screen w PCR reflex     Status:  None   Collection Time: 02/08/22  8:53 AM   Specimen: STOOL  Result Value Ref Range Status   C Diff antigen NEGATIVE NEGATIVE Final   C Diff toxin NEGATIVE NEGATIVE Final   C Diff interpretation No C. difficile detected.  Final    Comment: Performed at [facility], [place] [address], [place], [place] [address]     Labs: Basic Metabolic Panel: Recent Labs  Lab 02/07/22 0352 02/08/22 0223 02/09/22 0534 02/10/22 0435 02/11/22 0349 02/12/22 0409  NA 133* 132* 135 135 137 137  K 3.5 3.4* 3.8 4.4 4.5 4.0  CL 103 102 105 106 104 101  CO2 20* '22 24 25 28 27  '$ GLUCOSE 114* 151* 97 102* 96 104*  BUN 23 16 7* '10 10 8  '$ CREATININE 0.64 0.70 0.46 0.60 0.62 0.65  CALCIUM 8.5* 8.6* 8.4* 8.9 9.4 9.5  MG 1.6* 1.7 1.9 1.9  --  1.9  PHOS 3.1  --   --   --   --   --    Liver Function Tests: Recent Labs  Lab 02/06/22 1805 02/07/22 0352 02/08/22 0223 02/09/22 0534  AST '23 21 26 18  '$ ALT '23 20 23 19  '$ ALKPHOS 103 83 74 70  BILITOT 0.6 0.8 0.5 0.3  PROT 6.6 5.4* 5.1* 4.8*  ALBUMIN 3.5 2.8* 2.6* 2.4*   CBC: Recent Labs  Lab 02/06/22 1805 02/07/22 0352 02/08/22 0223 02/09/22 0534 02/11/22 0349 02/12/22 0409  WBC 7.0 4.7 4.1 4.1 7.4 9.5  NEUTROABS 6.1 3.9  --   --   --   --   HGB 12.0 10.6* 11.0* 10.1* 12.0 12.3  HCT 37.2 30.9* 32.2* 29.8* 36.4 36.1  MCV 95.4 93.1 92.3 92.0 93.6 91.2  PLT 248 205 179 205 277 305   CBG: No results for input(s): "GLUCAP" in the last 168 hours. Hgb A1c No results for input(s): "HGBA1C" in the last 72 hours. Lipid Profile No results for input(s): "CHOL", "HDL", "LDLCALC", "TRIG", "CHOLHDL", "LDLDIRECT" in the last 72 hours. Thyroid function studies No results for input(s): "TSH", "T4TOTAL", "T3FREE", "THYROIDAB" in the last 72 hours.  Invalid input(s): "FREET3" Urinalysis    Component Value Date/Time   COLORURINE AMBER (A) 02/06/2022 1736   APPEARANCEUR CLOUDY (A) 02/06/2022 1736   LABSPEC 1.025 02/06/2022 1736   PHURINE 5.0 02/06/2022  1736   GLUCOSEU NEGATIVE 02/06/2022 1736   GLUCOSEU NEGATIVE 04/05/2021 1236   HGBUR MODERATE (A) 02/06/2022 1736   BILIRUBINUR NEGATIVE 02/06/2022 1736   KETONESUR 5 (A) 02/06/2022 1736   PROTEINUR 30 (A) 02/06/2022 1736   UROBILINOGEN 0.2 04/05/2021 1236   NITRITE NEGATIVE 02/06/2022 1736  LEUKOCYTESUR LARGE (A) 02/06/2022 1736    FURTHER DISCHARGE INSTRUCTIONS:   Get Medicines reviewed and adjusted: Please take all your medications with you for your next visit with your Primary MD   Laboratory/radiological data: Please request your Primary MD to go over all hospital tests and procedure/radiological results at the follow up, please ask your Primary MD to get all Hospital records sent to his/her office.   In some cases, they will be blood work, cultures and biopsy results pending at the time of your discharge. Please request that your primary care M.D. goes through all the records of your hospital data and follows up on these results.   Also Note the following: If you experience worsening of your admission symptoms, develop shortness of breath, life threatening emergency, suicidal or homicidal thoughts you must seek medical attention immediately by calling 911 or calling your MD immediately  if symptoms less severe.   You must read complete instructions/literature along with all the possible adverse reactions/side effects for all the Medicines you take and that have been prescribed to you. Take any new Medicines after you have completely understood and accpet all the possible adverse reactions/side effects.    Do not drive when taking Pain medications or sleeping medications (Benzodaizepines)   Do not take more than prescribed Pain, Sleep and Anxiety Medications. It is not advisable to combine anxiety,sleep and pain medications without talking with your primary care practitioner   Special Instructions: If you have smoked or chewed Tobacco  in the last 2 yrs please stop smoking, stop  any regular Alcohol  and or any Recreational drug use.   Wear Seat belts while driving.   Please note: You were cared for by a hospitalist during your hospital stay. Once you are discharged, your primary care physician will handle any further medical issues. Please note that NO REFILLS for any discharge medications will be authorized once you are discharged, as it is imperative that you return to your primary care physician (or establish a relationship with a primary care physician if you do not have one) for your post hospital discharge needs so that they can reassess your need for medications and monitor your lab values.  Time coordinating discharge: 40 minutes  SIGNED:  Marzetta Board, MD, PhD 02/12/2022, 9:13 AM

## 2022-02-12 NOTE — Telephone Encounter (Signed)
Follow up scheduled for 02/27/22 at 8:50 am with Dr. Fuller Plan. Patient is currently admitted, appointment date/time will be on discharge AVS.

## 2022-02-12 NOTE — Progress Notes (Addendum)
Patient and family given discharge instructions. Medication education provided at length. Clarified with MD Cruzita Lederer about patient continuing to take metoprolol at home and eliquis at home as prescribed after patient had questioned if she should take these at home, stressed the importance of taking medications as prescribed. All questions about medications and what to do at home answered. PIV removed and patient discharged home with spouse.

## 2022-02-12 NOTE — Telephone Encounter (Signed)
-----   Message from Ladene Artist, MD sent at 02/11/2022  7:53 AM EDT ----- Regarding: FW: Hospital follow-up Please schedule an appt with me or an APP in 2-3 weeks. ----- Message ----- From: Doran Stabler, MD Sent: 02/10/2022   7:53 PM EDT To: Ladene Artist, MD Subject: Hospital follow-up                             Julie Montgomery,  Patient of yours, last seen about 2 years ago.  Recurrent C. difficile in recent months, here for diarrhea and generalized weakness.  Repeat stool study showed his cleared, was seen in infectious disease consult.  I started budesonide to hopefully hasten the improvement in her colitis, and she needs office follow-up.  HD

## 2022-02-13 ENCOUNTER — Telehealth: Payer: Self-pay

## 2022-02-13 NOTE — Telephone Encounter (Signed)
Transition Care Management Follow-up Telephone Call Date of discharge and from where: [facility] 02/12/2022 How have you been since you were released from the hospital? fine Any questions or concerns? No  Items Reviewed: Did the pt receive and understand the discharge instructions provided? Yes  Medications obtained and verified? Yes  Other? No  Any new allergies since your discharge? No  Dietary orders reviewed? Yes Do you have support at home? Yes   Home Care and Equipment/Supplies: Were home health services ordered? no If so, what is the name of the agency? N/a  Has the agency set up a time to come to the patient's home? not applicable Were any new equipment or medical supplies ordered?  No What is the name of the medical supply agency? N/a Were you able to get the supplies/equipment? not applicable Do you have any questions related to the use of the equipment or supplies? No  Functional Questionnaire: (I = Independent and D = Dependent) ADLs: I  Bathing/Dressing- I  Meal Prep- I  Eating- I  Maintaining continence- I  Transferring/Ambulation- I  Managing Meds- I  Follow up appointments reviewed:  PCP Hospital f/u appt confirmed? Yes  Scheduled to see  Dr Alain Marion on 02/27/2022 @ 2:20. [facility] f/u appt confirmed? No ; needs f/u appt with GI Are transportation arrangements needed? No  If their condition worsens, is the pt aware to call PCP or go to the Emergency Dept.? Yes Was the patient provided with contact information for the PCP's office or ED? Yes Was to pt encouraged to call back with questions or concerns? Yes Juanda Crumble, LPN [place] [facility] [phone]

## 2022-02-14 NOTE — Telephone Encounter (Signed)
Noted! Thank you

## 2022-02-19 NOTE — Telephone Encounter (Signed)
Patient called back in regards to diarrhea that we discussed this morning (see previous note). She is concerned that she needs to be seen sooner than 02/27/22 whether that it is here at our office or in the ED. She's taking all medications as prescribed. Holding down fluids/food. Advised that she could try imodium OTC, and continue to push fluids and stick with bland diet as previously discussed. Will route to MD for any further recommendations.

## 2022-02-19 NOTE — Telephone Encounter (Signed)
Patient states she is experiencing diarrhea and I made her aware of appt with Dr. Fuller Plan on 10/31. States her instructions advised her to see Dr.Stark in week. Please advise.

## 2022-02-19 NOTE — Telephone Encounter (Signed)
Patient called in stating she had an episode of loose, watery stool this morning & then two that followed that weren't as loose. She is currently taking all medications as prescribed. Pt advised to continue to drink plenty of fluids & stick with a bland diet. She has follow up on 02/27/22 with Dr. Fuller Plan & advised to keep that appointment and call back if symptoms persist/worsen. Pt verbalized all understanding.

## 2022-02-20 ENCOUNTER — Ambulatory Visit (INDEPENDENT_AMBULATORY_CARE_PROVIDER_SITE_OTHER): Payer: Medicare Other | Admitting: Internal Medicine

## 2022-02-20 ENCOUNTER — Ambulatory Visit (INDEPENDENT_AMBULATORY_CARE_PROVIDER_SITE_OTHER): Payer: Medicare Other

## 2022-02-20 ENCOUNTER — Encounter: Payer: Self-pay | Admitting: Gastroenterology

## 2022-02-20 ENCOUNTER — Encounter: Payer: Self-pay | Admitting: Internal Medicine

## 2022-02-20 VITALS — BP 126/70 | HR 83 | Temp 98.6°F | Ht 62.0 in | Wt 91.4 lb

## 2022-02-20 DIAGNOSIS — R197 Diarrhea, unspecified: Secondary | ICD-10-CM

## 2022-02-20 DIAGNOSIS — R1030 Lower abdominal pain, unspecified: Secondary | ICD-10-CM

## 2022-02-20 DIAGNOSIS — R140 Abdominal distension (gaseous): Secondary | ICD-10-CM | POA: Diagnosis not present

## 2022-02-20 NOTE — Progress Notes (Signed)
Subjective:    Patient ID: Julie Montgomery, female    DOB: 07-23-1935, 86 y.o.   MRN: [id number]      HPI Julie Montgomery is here for  Chief Complaint  Patient presents with   Diarrhea   This is a new patient to me.  She is here with her husband.  She does have dementia and he helps supplement the history.  Recently admitted to the hospital 10/10-10/16.  She has had a history of recurrent C. difficile diarrhea x6 went to the ED for worsening diarrhea associated with abdominal pain.  Concern for possible UTI and C. difficile was started empirically on Rocephin and IV Flagyl.  She does follow with ID and GI.  C. difficile ended up being negative.?  Postinfectious colitis.  Started on Bentyl, probiotics and Entocort.  Diarrhea improved.  She came home from the hospital last week.  Yesterday afternoon she had an episode of what sounds like a small amount of stool that was loose and she was concerned about the diarrhea returning.  She was instructed by GI to take 2 Imodium 3 times daily and she took 1 dose last night and again this morning.  This morning she did have 2 episodes of diarrhea which she describes as brown liquid.  She is a having some lower abdominal pain.  She states 2 days ago she did not have a bowel movement.  She is not sure if she had normal bowel movements prior to that since coming home from the hospital.  Her husband is not sure either.  She does state that the episode of diarrhea or loose stool or pieces of stool yesterday was a small amount.  She does have some abdominal distention  She did eat a little bit less today than most mornings.    Medications and allergies reviewed with patient and updated if appropriate.  Current Outpatient Medications on File Prior to Visit  Medication Sig Dispense Refill   apixaban (ELIQUIS) 2.5 MG TABS tablet Take 2.5 mg by mouth 2 (two) times daily.     budesonide (ENTOCORT EC) 3 MG 24 hr capsule Take 3 capsules (9 mg total) by mouth daily.  90 capsule 0   dicyclomine (BENTYL) 10 MG capsule Take 1 capsule (10 mg total) by mouth 3 (three) times daily before meals. 90 capsule 0   mupirocin ointment (BACTROBAN) 2 % Apply 1 Application topically daily.     Polyethyl Glycol-Propyl Glycol (SYSTANE OP) Place 1 drop into both eyes daily.     Probiotic Product (ALIGN) 4 MG CAPS Take 1 capsule (4 mg total) by mouth daily. 30 capsule 1   saccharomyces boulardii (FLORASTOR) 250 MG capsule Take 1 capsule (250 mg total) by mouth 2 (two) times daily. 60 capsule 0   VITAMIN D PO Take 1 tablet by mouth daily.     citalopram (CELEXA) 10 MG tablet Take 1 tablet (10 mg total) by mouth daily. (Patient not taking: Reported on 02/07/2022) 90 tablet 3   memantine (NAMENDA) 5 MG tablet Take 5 mg by mouth. (Patient not taking: Reported on 02/07/2022)     metoprolol succinate (TOPROL-XL) 25 MG 24 hr tablet Take 25 mg by mouth. (Patient not taking: Reported on 02/07/2022)     No current facility-administered medications on file prior to visit.    Review of Systems  Constitutional:  Negative for fever.  HENT:  Negative for congestion, sinus pain and sore throat.   Respiratory:  Negative for shortness of breath.  Cardiovascular:  Negative for chest pain and palpitations.  Gastrointestinal:  Positive for abdominal pain (lower abdomen) and diarrhea. Negative for blood in stool and nausea.       No gerd  Neurological:  Positive for dizziness (this morning - anxiety related) and light-headedness (this morning - anxiety related). Negative for headaches.  Psychiatric/Behavioral:  The patient is nervous/anxious.        Objective:   Vitals:   02/20/22 1301  Pulse: 83  Temp: 98.6 F (37 C)  SpO2: 96%   BP Readings from Last 3 Encounters:  02/12/22 (!) 145/72  01/26/22 126/60  01/16/22 128/62   Wt Readings from Last 3 Encounters:  02/20/22 91 lb 6.4 oz (41.5 kg)  02/07/22 91 lb 7.9 oz (41.5 kg)  01/26/22 89 lb (40.4 kg)   Body mass index is 16.72  kg/m.    Physical Exam Constitutional:      General: She is not in acute distress.    Appearance: Normal appearance. She is not ill-appearing.  HENT:     Head: Normocephalic and atraumatic.  Eyes:     Conjunctiva/sclera: Conjunctivae normal.  Cardiovascular:     Rate and Rhythm: Normal rate and regular rhythm.  Pulmonary:     Effort: Pulmonary effort is normal.     Breath sounds: Normal breath sounds.  Abdominal:     General: There is distension (Central lower abdomen).     Palpations: Abdomen is soft.     Tenderness: There is abdominal tenderness (Across lower abdomen). There is no guarding or rebound.  Musculoskeletal:     Right lower leg: No edema.     Left lower leg: No edema.  Skin:    General: Skin is warm and dry.     Findings: No rash.  Neurological:     Mental Status: She is alert.            Assessment & Plan:    Diarrhea, lower abdominal pain: Acute visit Has long history of recurrent C. difficile diarrhea over the past several months-following closely with GI and ID Recent hospitalization-had diarrhea, but C. difficile was negative.  Thought to have postinfectious colitis She is taking the dicyclomine, Entocort and probiotic Unfortunately she is not able to recall if she has had some normal bowel movements since coming home from the hospital or any bowel movements He does have lower abdominal distention and I am not sure if she had a significant amount of diarrhea or not and I worry about her taking the Imodium on a regular basis and causing constipation Abdominal x-ray today just to make sure she does not have significant amount of stool in her colon and is actually constipated Reviewed recommended diet from Dr. Fuller Plan Stressed to increased water intake Advised Imodium as needed only Discussed that we may need to talk on a daily basis or every other day to adjust things depending on her stools Encouraged her to write down bowel movements that she is  having so that we have a better picture of what is happening at home  Has follow-up with PCP and Dr. Fuller Plan in 1 week    I spent 20 minutes dedicated to the care of this patient on the date of this encounter including review of recent labs, imaging and procedures, speciality notes, obtaining history, communicating with the patient and husband, ordering tests, and documenting clinical information in the EHR

## 2022-02-20 NOTE — Telephone Encounter (Signed)
Called to check in with patient & she asked that I give her a call back shortly.

## 2022-02-20 NOTE — Telephone Encounter (Signed)
Spoke with patient regarding MD recommendations. Pt verbalized all understanding. 

## 2022-02-20 NOTE — Telephone Encounter (Signed)
Follow all prior guidance. Imodium 1-2 po tid. No lactose, low fat, low fiber diet. No raw fruits or vegetables. If her symptoms are not controlled can see if an APP has an appt, see her PCP or return to the ED.

## 2022-02-20 NOTE — Patient Instructions (Signed)
     Have an xray today downstairs.     Medications changes include :   hold imodium - you can take 1 tab three times a day only if needed for significant diarrhea     Call with questions.

## 2022-02-21 ENCOUNTER — Inpatient Hospital Stay (HOSPITAL_COMMUNITY)
Admission: EM | Admit: 2022-02-21 | Discharge: 2022-02-28 | DRG: 371 | Disposition: A | Discharge status: Home Health | Payer: Medicare Other | Attending: Internal Medicine | Admitting: Internal Medicine

## 2022-02-21 ENCOUNTER — Encounter (HOSPITAL_COMMUNITY): Payer: Self-pay

## 2022-02-21 ENCOUNTER — Other Ambulatory Visit: Payer: Self-pay

## 2022-02-21 ENCOUNTER — Encounter: Payer: Self-pay | Admitting: Internal Medicine

## 2022-02-21 DIAGNOSIS — Z801 Family history of malignant neoplasm of trachea, bronchus and lung: Secondary | ICD-10-CM | POA: Diagnosis not present

## 2022-02-21 DIAGNOSIS — R64 Cachexia: Secondary | ICD-10-CM | POA: Diagnosis not present

## 2022-02-21 DIAGNOSIS — Z833 Family history of diabetes mellitus: Secondary | ICD-10-CM

## 2022-02-21 DIAGNOSIS — Z85828 Personal history of other malignant neoplasm of skin: Secondary | ICD-10-CM | POA: Diagnosis not present

## 2022-02-21 DIAGNOSIS — F32A Depression, unspecified: Secondary | ICD-10-CM | POA: Diagnosis not present

## 2022-02-21 DIAGNOSIS — E860 Dehydration: Secondary | ICD-10-CM | POA: Diagnosis present

## 2022-02-21 DIAGNOSIS — R001 Bradycardia, unspecified: Secondary | ICD-10-CM | POA: Diagnosis present

## 2022-02-21 DIAGNOSIS — Z681 Body mass index (BMI) 19 or less, adult: Secondary | ICD-10-CM | POA: Diagnosis not present

## 2022-02-21 DIAGNOSIS — I2510 Atherosclerotic heart disease of native coronary artery without angina pectoris: Secondary | ICD-10-CM | POA: Diagnosis not present

## 2022-02-21 DIAGNOSIS — R197 Diarrhea, unspecified: Secondary | ICD-10-CM | POA: Diagnosis not present

## 2022-02-21 DIAGNOSIS — R32 Unspecified urinary incontinence: Secondary | ICD-10-CM | POA: Diagnosis present

## 2022-02-21 DIAGNOSIS — I4819 Other persistent atrial fibrillation: Secondary | ICD-10-CM | POA: Diagnosis not present

## 2022-02-21 DIAGNOSIS — E43 Unspecified severe protein-calorie malnutrition: Secondary | ICD-10-CM | POA: Diagnosis not present

## 2022-02-21 DIAGNOSIS — Z7901 Long term (current) use of anticoagulants: Secondary | ICD-10-CM | POA: Diagnosis not present

## 2022-02-21 DIAGNOSIS — Z79899 Other long term (current) drug therapy: Secondary | ICD-10-CM

## 2022-02-21 DIAGNOSIS — E871 Hypo-osmolality and hyponatremia: Secondary | ICD-10-CM | POA: Diagnosis present

## 2022-02-21 DIAGNOSIS — Z9842 Cataract extraction status, left eye: Secondary | ICD-10-CM

## 2022-02-21 DIAGNOSIS — F419 Anxiety disorder, unspecified: Secondary | ICD-10-CM | POA: Diagnosis present

## 2022-02-21 DIAGNOSIS — I480 Paroxysmal atrial fibrillation: Secondary | ICD-10-CM | POA: Diagnosis present

## 2022-02-21 DIAGNOSIS — I4891 Unspecified atrial fibrillation: Secondary | ICD-10-CM | POA: Diagnosis not present

## 2022-02-21 DIAGNOSIS — E876 Hypokalemia: Secondary | ICD-10-CM | POA: Diagnosis not present

## 2022-02-21 DIAGNOSIS — I700 Atherosclerosis of aorta: Secondary | ICD-10-CM | POA: Diagnosis present

## 2022-02-21 DIAGNOSIS — K5939 Other megacolon: Secondary | ICD-10-CM | POA: Diagnosis not present

## 2022-02-21 DIAGNOSIS — Z8673 Personal history of transient ischemic attack (TIA), and cerebral infarction without residual deficits: Secondary | ICD-10-CM

## 2022-02-21 DIAGNOSIS — M8580 Other specified disorders of bone density and structure, unspecified site: Secondary | ICD-10-CM | POA: Diagnosis present

## 2022-02-21 DIAGNOSIS — Z8249 Family history of ischemic heart disease and other diseases of the circulatory system: Secondary | ICD-10-CM | POA: Diagnosis not present

## 2022-02-21 DIAGNOSIS — Z882 Allergy status to sulfonamides status: Secondary | ICD-10-CM

## 2022-02-21 DIAGNOSIS — A0471 Enterocolitis due to Clostridium difficile, recurrent: Principal | ICD-10-CM | POA: Diagnosis present

## 2022-02-21 DIAGNOSIS — K567 Ileus, unspecified: Secondary | ICD-10-CM | POA: Diagnosis present

## 2022-02-21 DIAGNOSIS — Z9049 Acquired absence of other specified parts of digestive tract: Secondary | ICD-10-CM | POA: Diagnosis not present

## 2022-02-21 DIAGNOSIS — R109 Unspecified abdominal pain: Secondary | ICD-10-CM | POA: Diagnosis not present

## 2022-02-21 DIAGNOSIS — R140 Abdominal distension (gaseous): Secondary | ICD-10-CM | POA: Diagnosis not present

## 2022-02-21 DIAGNOSIS — Z86010 Personal history of colonic polyps: Secondary | ICD-10-CM

## 2022-02-21 DIAGNOSIS — Z90711 Acquired absence of uterus with remaining cervical stump: Secondary | ICD-10-CM

## 2022-02-21 DIAGNOSIS — N281 Cyst of kidney, acquired: Secondary | ICD-10-CM | POA: Diagnosis not present

## 2022-02-21 DIAGNOSIS — Z888 Allergy status to other drugs, medicaments and biological substances status: Secondary | ICD-10-CM

## 2022-02-21 DIAGNOSIS — K3189 Other diseases of stomach and duodenum: Secondary | ICD-10-CM | POA: Diagnosis not present

## 2022-02-21 DIAGNOSIS — Z800 Family history of malignant neoplasm of digestive organs: Secondary | ICD-10-CM

## 2022-02-21 DIAGNOSIS — K6389 Other specified diseases of intestine: Secondary | ICD-10-CM | POA: Diagnosis not present

## 2022-02-21 DIAGNOSIS — Z9841 Cataract extraction status, right eye: Secondary | ICD-10-CM

## 2022-02-21 DIAGNOSIS — Z8619 Personal history of other infectious and parasitic diseases: Secondary | ICD-10-CM

## 2022-02-21 DIAGNOSIS — Z961 Presence of intraocular lens: Secondary | ICD-10-CM | POA: Diagnosis present

## 2022-02-21 DIAGNOSIS — F03A0 Unspecified dementia, mild, without behavioral disturbance, psychotic disturbance, mood disturbance, and anxiety: Secondary | ICD-10-CM | POA: Diagnosis not present

## 2022-02-21 LAB — CBC WITH DIFFERENTIAL/PLATELET
Abs Immature Granulocytes: 0.01 10*3/uL (ref 0.00–0.07)
Basophils Absolute: 0 10*3/uL (ref 0.0–0.1)
Basophils Relative: 1 %
Eosinophils Absolute: 0 10*3/uL (ref 0.0–0.5)
Eosinophils Relative: 0 %
HCT: 34.5 % — ABNORMAL LOW (ref 36.0–46.0)
Hemoglobin: 11.2 g/dL — ABNORMAL LOW (ref 12.0–15.0)
Immature Granulocytes: 0 %
Lymphocytes Relative: 12 %
Lymphs Abs: 0.4 10*3/uL — ABNORMAL LOW (ref 0.7–4.0)
MCH: 30.5 pg (ref 26.0–34.0)
MCHC: 32.5 g/dL (ref 30.0–36.0)
MCV: 94 fL (ref 80.0–100.0)
Monocytes Absolute: 0.4 10*3/uL (ref 0.1–1.0)
Monocytes Relative: 14 %
Neutro Abs: 2.2 10*3/uL (ref 1.7–7.7)
Neutrophils Relative %: 73 %
Platelets: 193 10*3/uL (ref 150–400)
RBC: 3.67 MIL/uL — ABNORMAL LOW (ref 3.87–5.11)
RDW: 14.9 % (ref 11.5–15.5)
WBC: 3 10*3/uL — ABNORMAL LOW (ref 4.0–10.5)
nRBC: 0 % (ref 0.0–0.2)

## 2022-02-21 LAB — COMPREHENSIVE METABOLIC PANEL
ALT: 20 U/L (ref 0–44)
AST: 25 U/L (ref 15–41)
Albumin: 2.8 g/dL — ABNORMAL LOW (ref 3.5–5.0)
Alkaline Phosphatase: 73 U/L (ref 38–126)
Anion gap: 11 (ref 5–15)
BUN: 22 mg/dL (ref 8–23)
CO2: 22 mmol/L (ref 22–32)
Calcium: 9.1 mg/dL (ref 8.9–10.3)
Chloride: 96 mmol/L — ABNORMAL LOW (ref 98–111)
Creatinine, Ser: 0.57 mg/dL (ref 0.44–1.00)
GFR, Estimated: >60 mL/min (ref >60)
Glucose, Bld: 102 mg/dL — ABNORMAL HIGH (ref 70–99)
Potassium: 3.8 mmol/L (ref 3.5–5.1)
Sodium: 129 mmol/L — ABNORMAL LOW (ref 135–145)
Total Bilirubin: 0.9 mg/dL (ref 0.3–1.2)
Total Protein: 5.6 g/dL — ABNORMAL LOW (ref 6.5–8.1)

## 2022-02-21 LAB — URINALYSIS, ROUTINE W REFLEX MICROSCOPIC
Bilirubin Urine: NEGATIVE
Glucose, UA: NEGATIVE mg/dL
Ketones, ur: 20 mg/dL — AB
Nitrite: NEGATIVE
Protein, ur: NEGATIVE mg/dL
Specific Gravity, Urine: 1.023 (ref 1.005–1.030)
pH: 5 (ref 5.0–8.0)

## 2022-02-21 NOTE — ED Triage Notes (Signed)
Pt reports she has been having diarrhea that started last night. She woke up this morning and the bed was soaked. She has been in and out of the hospital for same complaint for the last 7 weeks. Hx of c-diff. She reports tenderness/soreness to abd.

## 2022-02-21 NOTE — ED Provider Triage Note (Signed)
Emergency Medicine Provider Triage Evaluation Note  Julie Montgomery , a 86 y.o. female  was evaluated in triage.  Pt complains of returning diarrhea.  Usually puts pads on the bed, woke up to her entire bed being "soaked".  Has been in and out of the hospital over the last 2 months due to what appears to be infectious diarrhea, possibly C. difficile.  Denies fevers, chills.  States his diarrhea feels the same as before.  Review of Systems  Positive:  Negative: See above  Physical Exam  BP 139/73 (BP Location: Left Arm)   Pulse 85   Temp 98.1 F (36.7 C) (Oral)   Resp 16   Ht '5\' 2"'$  (1.575 m)   Wt 41.3 kg   SpO2 96%   BMI 16.64 kg/m  Gen:   Awake, no distress, sitting comfortably, mildly cachectic appearing Resp:  Normal effort  MSK:   Moves extremities without difficulty  Other:  Appears clinically dehydrated  Medical Decision Making  Medically screening exam initiated at 3:23 PM.  Appropriate orders placed.  Julie Montgomery was informed that the remainder of the evaluation will be completed by another provider, this initial triage assessment does not replace that evaluation, and the importance of remaining in the ED until their evaluation is complete.     Julie Rome, PA-C 97/41/63 1526

## 2022-02-22 ENCOUNTER — Emergency Department (HOSPITAL_COMMUNITY): Payer: Medicare Other

## 2022-02-22 DIAGNOSIS — E43 Unspecified severe protein-calorie malnutrition: Secondary | ICD-10-CM | POA: Diagnosis present

## 2022-02-22 DIAGNOSIS — A0471 Enterocolitis due to Clostridium difficile, recurrent: Secondary | ICD-10-CM | POA: Diagnosis present

## 2022-02-22 DIAGNOSIS — I4819 Other persistent atrial fibrillation: Secondary | ICD-10-CM | POA: Diagnosis present

## 2022-02-22 DIAGNOSIS — K5939 Other megacolon: Secondary | ICD-10-CM | POA: Diagnosis not present

## 2022-02-22 DIAGNOSIS — N281 Cyst of kidney, acquired: Secondary | ICD-10-CM | POA: Diagnosis not present

## 2022-02-22 DIAGNOSIS — R64 Cachexia: Secondary | ICD-10-CM | POA: Diagnosis present

## 2022-02-22 DIAGNOSIS — I2510 Atherosclerotic heart disease of native coronary artery without angina pectoris: Secondary | ICD-10-CM | POA: Diagnosis present

## 2022-02-22 DIAGNOSIS — F32A Depression, unspecified: Secondary | ICD-10-CM | POA: Diagnosis present

## 2022-02-22 DIAGNOSIS — K567 Ileus, unspecified: Secondary | ICD-10-CM | POA: Diagnosis present

## 2022-02-22 DIAGNOSIS — E876 Hypokalemia: Secondary | ICD-10-CM | POA: Diagnosis present

## 2022-02-22 DIAGNOSIS — Z8619 Personal history of other infectious and parasitic diseases: Secondary | ICD-10-CM | POA: Diagnosis not present

## 2022-02-22 DIAGNOSIS — Z85828 Personal history of other malignant neoplasm of skin: Secondary | ICD-10-CM | POA: Diagnosis not present

## 2022-02-22 DIAGNOSIS — F03A0 Unspecified dementia, mild, without behavioral disturbance, psychotic disturbance, mood disturbance, and anxiety: Secondary | ICD-10-CM | POA: Diagnosis present

## 2022-02-22 DIAGNOSIS — Z801 Family history of malignant neoplasm of trachea, bronchus and lung: Secondary | ICD-10-CM | POA: Diagnosis not present

## 2022-02-22 DIAGNOSIS — Z9049 Acquired absence of other specified parts of digestive tract: Secondary | ICD-10-CM | POA: Diagnosis not present

## 2022-02-22 DIAGNOSIS — I480 Paroxysmal atrial fibrillation: Secondary | ICD-10-CM | POA: Diagnosis not present

## 2022-02-22 DIAGNOSIS — R109 Unspecified abdominal pain: Secondary | ICD-10-CM | POA: Diagnosis not present

## 2022-02-22 DIAGNOSIS — Z681 Body mass index (BMI) 19 or less, adult: Secondary | ICD-10-CM | POA: Diagnosis not present

## 2022-02-22 DIAGNOSIS — M8580 Other specified disorders of bone density and structure, unspecified site: Secondary | ICD-10-CM | POA: Diagnosis present

## 2022-02-22 DIAGNOSIS — Z8249 Family history of ischemic heart disease and other diseases of the circulatory system: Secondary | ICD-10-CM | POA: Diagnosis not present

## 2022-02-22 DIAGNOSIS — R140 Abdominal distension (gaseous): Secondary | ICD-10-CM | POA: Diagnosis not present

## 2022-02-22 DIAGNOSIS — E871 Hypo-osmolality and hyponatremia: Secondary | ICD-10-CM | POA: Diagnosis present

## 2022-02-22 DIAGNOSIS — E860 Dehydration: Secondary | ICD-10-CM | POA: Diagnosis present

## 2022-02-22 DIAGNOSIS — Z800 Family history of malignant neoplasm of digestive organs: Secondary | ICD-10-CM | POA: Diagnosis not present

## 2022-02-22 DIAGNOSIS — K6389 Other specified diseases of intestine: Secondary | ICD-10-CM | POA: Diagnosis not present

## 2022-02-22 DIAGNOSIS — I700 Atherosclerosis of aorta: Secondary | ICD-10-CM | POA: Diagnosis present

## 2022-02-22 DIAGNOSIS — Z833 Family history of diabetes mellitus: Secondary | ICD-10-CM | POA: Diagnosis not present

## 2022-02-22 DIAGNOSIS — Z7901 Long term (current) use of anticoagulants: Secondary | ICD-10-CM | POA: Diagnosis not present

## 2022-02-22 DIAGNOSIS — Z8673 Personal history of transient ischemic attack (TIA), and cerebral infarction without residual deficits: Secondary | ICD-10-CM | POA: Diagnosis not present

## 2022-02-22 DIAGNOSIS — K3189 Other diseases of stomach and duodenum: Secondary | ICD-10-CM | POA: Diagnosis not present

## 2022-02-22 DIAGNOSIS — Z86010 Personal history of colonic polyps: Secondary | ICD-10-CM | POA: Diagnosis not present

## 2022-02-22 LAB — BASIC METABOLIC PANEL
Anion gap: 12 (ref 5–15)
BUN: 18 mg/dL (ref 8–23)
CO2: 21 mmol/L — ABNORMAL LOW (ref 22–32)
Calcium: 8.9 mg/dL (ref 8.9–10.3)
Chloride: 97 mmol/L — ABNORMAL LOW (ref 98–111)
Creatinine, Ser: 0.69 mg/dL (ref 0.44–1.00)
GFR, Estimated: >60 mL/min (ref >60)
Glucose, Bld: 85 mg/dL (ref 70–99)
Potassium: 3.3 mmol/L — ABNORMAL LOW (ref 3.5–5.1)
Sodium: 130 mmol/L — ABNORMAL LOW (ref 135–145)

## 2022-02-22 LAB — C DIFFICILE QUICK SCREEN W PCR REFLEX
C Diff antigen: POSITIVE — AB
C Diff toxin: NEGATIVE

## 2022-02-22 LAB — MAGNESIUM: Magnesium: 2.2 mg/dL (ref 1.7–2.4)

## 2022-02-22 LAB — CLOSTRIDIUM DIFFICILE BY PCR, REFLEXED: Toxigenic C. Difficile by PCR: POSITIVE — AB

## 2022-02-22 MED ORDER — VANCOMYCIN HCL 125 MG PO CAPS: 125.0000 mg | ORAL_CAPSULE | ORAL | Status: DC

## 2022-02-22 MED ORDER — MAGNESIUM SULFATE 2 GM/50ML IV SOLN
2.0000 g | Freq: Once | INTRAVENOUS | Status: AC
  Administered 2022-02-22: 2 g via INTRAVENOUS
  Filled 2022-02-22: qty 50

## 2022-02-22 MED ORDER — HYDRALAZINE HCL 20 MG/ML IJ SOLN: 5.0000 mg | INTRAMUSCULAR | Status: DC | PRN

## 2022-02-22 MED ORDER — POTASSIUM CHLORIDE 10 MEQ/100ML IV SOLN
10.0000 meq | Freq: Once | INTRAVENOUS | Status: AC
  Administered 2022-02-22: 10 meq via INTRAVENOUS
  Filled 2022-02-22: qty 100

## 2022-02-22 MED ORDER — LOPERAMIDE HCL 2 MG PO CAPS
2.0000 mg | ORAL_CAPSULE | ORAL | Status: DC | PRN
  Administered 2022-02-22: 2 mg via ORAL
  Filled 2022-02-22: qty 1

## 2022-02-22 MED ORDER — VANCOMYCIN HCL 125 MG PO CAPS
125.0000 mg | ORAL_CAPSULE | Freq: Two times a day (BID) | ORAL | Status: DC
  Administered 2022-02-22: 125 mg via ORAL

## 2022-02-22 MED ORDER — RISAQUAD PO CAPS
1.0000 | ORAL_CAPSULE | Freq: Every day | ORAL | Status: AC
  Administered 2022-02-23 – 2022-02-27 (×5): 1 via ORAL
  Filled 2022-02-22 (×5): qty 1

## 2022-02-22 MED ORDER — SODIUM CHLORIDE 0.9 % IV BOLUS
1000.0000 mL | Freq: Once | INTRAVENOUS | Status: AC
  Administered 2022-02-22: 1000 mL via INTRAVENOUS

## 2022-02-22 MED ORDER — ACETAMINOPHEN 650 MG RE SUPP: 650.0000 mg | Freq: Four times a day (QID) | RECTAL | Status: DC | PRN

## 2022-02-22 MED ORDER — SACCHAROMYCES BOULARDII 250 MG PO CAPS
250.0000 mg | ORAL_CAPSULE | Freq: Two times a day (BID) | ORAL | Status: DC
  Administered 2022-02-22 – 2022-02-28 (×12): 250 mg via ORAL
  Filled 2022-02-22 (×12): qty 1

## 2022-02-22 MED ORDER — LACTATED RINGERS IV SOLN: INTRAVENOUS | Status: DC

## 2022-02-22 MED ORDER — DILTIAZEM HCL-DEXTROSE 125-5 MG/125ML-% IV SOLN (PREMIX)
5.0000 mg/h | INTRAVENOUS | Status: DC
  Administered 2022-02-22: 5 mg/h via INTRAVENOUS
  Filled 2022-02-22: qty 125

## 2022-02-22 MED ORDER — MORPHINE SULFATE (PF) 2 MG/ML IV SOLN
2.0000 mg | INTRAVENOUS | Status: DC | PRN
  Administered 2022-02-26: 2 mg via INTRAVENOUS
  Filled 2022-02-22: qty 1

## 2022-02-22 MED ORDER — DICYCLOMINE HCL 10 MG PO CAPS
10.0000 mg | ORAL_CAPSULE | Freq: Four times a day (QID) | ORAL | Status: DC | PRN
  Administered 2022-02-23: 10 mg via ORAL
  Filled 2022-02-22 (×2): qty 1

## 2022-02-22 MED ORDER — POTASSIUM CHLORIDE CRYS ER 20 MEQ PO TBCR
40.0000 meq | EXTENDED_RELEASE_TABLET | Freq: Once | ORAL | Status: AC
  Administered 2022-02-22: 40 meq via ORAL
  Filled 2022-02-22: qty 2

## 2022-02-22 MED ORDER — APIXABAN 2.5 MG PO TABS
2.5000 mg | ORAL_TABLET | Freq: Two times a day (BID) | ORAL | Status: DC
  Administered 2022-02-22 – 2022-02-28 (×12): 2.5 mg via ORAL
  Filled 2022-02-22 (×12): qty 1

## 2022-02-22 MED ORDER — IOHEXOL 350 MG/ML SOLN
75.0000 mL | Freq: Once | INTRAVENOUS | Status: AC | PRN
  Administered 2022-02-22: 75 mL via INTRAVENOUS

## 2022-02-22 MED ORDER — OXYCODONE HCL 5 MG PO TABS
5.0000 mg | ORAL_TABLET | ORAL | Status: DC | PRN
  Administered 2022-02-24: 5 mg via ORAL
  Filled 2022-02-22: qty 1

## 2022-02-22 MED ORDER — SODIUM CHLORIDE 0.9% FLUSH
3.0000 mL | Freq: Two times a day (BID) | INTRAVENOUS | Status: DC
  Administered 2022-02-22 – 2022-02-27 (×8): 3 mL via INTRAVENOUS

## 2022-02-22 MED ORDER — METOPROLOL TARTRATE 5 MG/5ML IV SOLN
5.0000 mg | Freq: Once | INTRAVENOUS | Status: AC
  Administered 2022-02-22: 5 mg via INTRAVENOUS
  Filled 2022-02-22: qty 5

## 2022-02-22 MED ORDER — VANCOMYCIN HCL 125 MG PO CAPS
125.0000 mg | ORAL_CAPSULE | Freq: Four times a day (QID) | ORAL | Status: DC
  Administered 2022-02-22 – 2022-02-28 (×23): 125 mg via ORAL
  Filled 2022-02-22 (×26): qty 1

## 2022-02-22 MED ORDER — DICYCLOMINE HCL 10 MG PO CAPS
20.0000 mg | ORAL_CAPSULE | Freq: Once | ORAL | Status: AC
  Administered 2022-02-22: 20 mg via ORAL
  Filled 2022-02-22: qty 2

## 2022-02-22 MED ORDER — BOOST / RESOURCE BREEZE PO LIQD CUSTOM
1.0000 | Freq: Three times a day (TID) | ORAL | Status: DC
  Administered 2022-02-22 – 2022-02-28 (×10): 1 via ORAL

## 2022-02-22 MED ORDER — ONDANSETRON HCL 4 MG/2ML IJ SOLN
4.0000 mg | Freq: Four times a day (QID) | INTRAMUSCULAR | Status: DC | PRN
  Administered 2022-02-25 – 2022-02-26 (×2): 4 mg via INTRAVENOUS
  Filled 2022-02-22 (×2): qty 2

## 2022-02-22 MED ORDER — ACETAMINOPHEN 325 MG PO TABS
650.0000 mg | ORAL_TABLET | Freq: Four times a day (QID) | ORAL | Status: DC | PRN
  Administered 2022-02-23 – 2022-02-27 (×5): 650 mg via ORAL
  Filled 2022-02-22 (×5): qty 2

## 2022-02-22 MED ORDER — VANCOMYCIN HCL 125 MG PO CAPS: 125.0000 mg | ORAL_CAPSULE | Freq: Every day | ORAL | Status: DC

## 2022-02-22 NOTE — ED Provider Notes (Signed)
[facility] EMERGENCY DEPARTMENT Provider Note   CSN: [id number] Arrival date & time: 02/21/22  1338     History  Chief Complaint  Patient presents with   Diarrhea    Julie Montgomery is a 86 y.o. female.  Julie Montgomery is a 86 y.o. female with a history of paroxysmal A-fib, H. pylori, stroke, anxiety, depression, who presents to the emergency department for evaluation of persistent diarrhea.  Patient has been in and out of the hospital over the past 2 months due to diarrhea and C. difficile infection.  She has been having more frequent bouts of diarrhea over the past few days and has been soaking through pads of the bed with liquid stool.  No known fevers or chills.  Most recently discharged on 10/16 and had been doing much better until about 3 days ago when diarrhea began to return.  Reports some tenderness and soreness to her abdomen.  [name] husband is at bedside and helps to provide additional history.  Patient has been followed by GI and ID.  During most recent hospitalization patient was not placed on antibiotics but treated with, probiotics and Bentyl.  The history is provided by the patient and medical records.  Diarrhea Associated symptoms: abdominal pain   Associated symptoms: no chills, no fever and no vomiting        Home Medications Prior to Admission medications   Medication Sig Start Date End Date Taking? Authorizing Provider  apixaban (ELIQUIS) 2.5 MG TABS tablet Take 2.5 mg by mouth 2 (two) times daily.    [provider]  budesonide (ENTOCORT EC) 3 MG 24 hr capsule Take 3 capsules (9 mg total) by mouth daily. 02/12/22 03/14/22  Caren Griffins, MD  dicyclomine (BENTYL) 10 MG capsule Take 1 capsule (10 mg total) by mouth 3 (three) times daily before meals. 02/12/22 03/14/22  Caren Griffins, MD  loperamide (IMODIUM) 2 MG capsule Take by mouth as needed for diarrhea or loose stools.    [provider]  Probiotic Product  (ALIGN) 4 MG CAPS Take 1 capsule (4 mg total) by mouth daily. 12/26/21   Plotnikov, Evie Lacks, MD  saccharomyces boulardii (FLORASTOR) 250 MG capsule Take 1 capsule (250 mg total) by mouth 2 (two) times daily. 02/12/22 03/14/22  Caren Griffins, MD  VITAMIN D PO Take 1 tablet by mouth daily.    [provider]      Allergies    Lovastatin, Mometasone furo-formoterol fum, Prozac [fluoxetine hcl], and Sulfa antibiotics    Review of Systems   Review of Systems  Constitutional:  Negative for chills and fever.  Gastrointestinal:  Positive for abdominal pain and diarrhea. Negative for vomiting.  Neurological:  Positive for weakness.    Physical Exam Updated Vital Signs BP 124/62 (BP Location: Left Arm)   Pulse 82   Temp 98.2 F (36.8 C) (Oral)   Resp 19   Ht '5\' 2"'$  (1.575 m)   Wt 41.3 kg   SpO2 97%   BMI 16.64 kg/m  Physical Exam Vitals and nursing note reviewed.  Constitutional:      General: She is not in acute distress.    Appearance: Normal appearance. She is well-developed. She is ill-appearing. She is not diaphoretic.     Comments: Alert but ill-appearing, not in acute distress.  HENT:     Head: Normocephalic and atraumatic.     Mouth/Throat:     Mouth: Mucous membranes are dry.     Pharynx:  Oropharynx is clear.  Eyes:     General:        Right eye: No discharge.        Left eye: No discharge.     Pupils: Pupils are equal, round, and reactive to light.  Cardiovascular:     Rate and Rhythm: Normal rate and regular rhythm.     Pulses: Normal pulses.     Heart sounds: Normal heart sounds.  Pulmonary:     Effort: Pulmonary effort is normal. No respiratory distress.     Breath sounds: Normal breath sounds. No wheezing or rales.     Comments: Respirations equal and unlabored, patient able to speak in full sentences, lungs clear to auscultation bilaterally  Abdominal:     General: Bowel sounds are normal. There is no distension.     Palpations: Abdomen is soft.  There is no mass.     Tenderness: There is abdominal tenderness. There is no guarding.     Comments: Abdomen soft, nondistended, hyperactive bowel sounds noted, mild generalized tenderness that does not localize to 1 quadrant  Musculoskeletal:        General: No deformity.     Cervical back: Neck supple.     Right lower leg: No edema.     Left lower leg: No edema.  Skin:    General: Skin is warm and dry.     Capillary Refill: Capillary refill takes less than 2 seconds.  Neurological:     Mental Status: She is alert and oriented to person, place, and time.     Coordination: Coordination normal.     Comments: Speech is clear, able to follow commands Moves extremities without ataxia, coordination intact  Psychiatric:        Mood and Affect: Mood normal.        Behavior: Behavior normal.     ED Results / Procedures / Treatments   Labs (all labs ordered are listed, but only abnormal results are displayed) Labs Reviewed  C DIFFICILE QUICK SCREEN W PCR REFLEX   - Abnormal; Notable for the following components:      Result Value   C Diff antigen POSITIVE (*)    All other components within normal limits  CLOSTRIDIUM DIFFICILE BY PCR, REFLEXED - Abnormal; Notable for the following components:   Toxigenic C. Difficile by PCR POSITIVE (*)    All other components within normal limits  CBC WITH DIFFERENTIAL/PLATELET - Abnormal; Notable for the following components:   WBC 3.0 (*)    RBC 3.67 (*)    Hemoglobin 11.2 (*)    HCT 34.5 (*)    Lymphs Abs 0.4 (*)    All other components within normal limits  COMPREHENSIVE METABOLIC PANEL - Abnormal; Notable for the following components:   Sodium 129 (*)    Chloride 96 (*)    Glucose, Bld 102 (*)    Total Protein 5.6 (*)    Albumin 2.8 (*)    All other components within normal limits  URINALYSIS, ROUTINE W REFLEX MICROSCOPIC - Abnormal; Notable for the following components:   APPearance CLOUDY (*)    Hgb urine dipstick SMALL (*)    Ketones,  ur 20 (*)    Leukocytes,Ua MODERATE (*)    Bacteria, UA FEW (*)    Non Squamous Epithelial 0-5 (*)    All other components within normal limits  BASIC METABOLIC PANEL - Abnormal; Notable for the following components:   Sodium 130 (*)    Potassium 3.3 (*)  Chloride 97 (*)    CO2 21 (*)    All other components within normal limits  MAGNESIUM    EKG None  Radiology CT ABDOMEN PELVIS W CONTRAST  Result Date: 02/22/2022 CLINICAL DATA:  Diffuse abdominal pain with diarrhea for several days. EXAM: CT ABDOMEN AND PELVIS WITH CONTRAST TECHNIQUE: Multidetector CT imaging of the abdomen and pelvis was performed using the standard protocol following bolus administration of intravenous contrast. RADIATION DOSE REDUCTION: This exam was performed according to the departmental dose-optimization program which includes automated exposure control, adjustment of the mA and/or kV according to patient size and/or use of iterative reconstruction technique. CONTRAST:  35m OMNIPAQUE IOHEXOL 350 MG/ML SOLN COMPARISON:  Abdominopelvic CT 01/11/2022 and 12/24/2021. FINDINGS: Lower chest: Similar chronic central airway thickening and mucous plugging at both lung bases. No airspace disease or suspicious nodularity. No pleural or pericardial effusion. There is a small hiatal hernia. Hepatobiliary: The liver is normal in density without suspicious focal abnormality. Stable mild intra and extrahepatic biliary dilatation status post cholecystectomy, within physiologic limits. Pancreas: Unremarkable. No pancreatic ductal dilatation or surrounding inflammatory changes. Spleen: Normal in size without focal abnormality. Adrenals/Urinary Tract: Both adrenal glands appear normal. No evidence of urinary tract calculus, hydronephrosis or perinephric soft tissue stranding. A simple cyst in the interpolar region of the left kidney is stable, for which no follow-up imaging recommended. Anteriorly in the interpolar region of the right  kidney, there is a possible new small area of cortical scarring or inflammation without associated perinephric fluid collection. The bladder is moderately distended without focal abnormality. Stomach/Bowel: No enteric contrast administered. The stomach is decompressed. No small bowel distension, wall thickening or surrounding inflammation. Progressive moderate dilatation of the proximal to mid colon with mild wall thickening, mucosal hyperenhancement and scattered air-fluid levels. The cecum is located in the central upper abdomen. The distal colon is decompressed with persistent wall thickening and surrounding inflammation, remaining suspicious for diffuse colitis. No evidence of pneumatosis or perforation. Vascular/Lymphatic: There are no enlarged abdominal or pelvic lymph nodes. Aortic and branch vessel atherosclerosis without acute vascular findings. Unchanged chronically thrombosed splenic artery aneurysm measuring 1.5 cm. Reproductive: Hysterectomy.  No adnexal mass is identified. Other: No ascites, pneumoperitoneum or focal extraluminal fluid collections identified. Musculoskeletal: No acute or significant osseous findings. Lower lumbar facet arthropathy. IMPRESSION: 1. Persistent findings of diffuse colitis in this patient with a history of C difficile colitis. Progressive moderate dilatation of the proximal to mid colon without evidence of obstruction or perforation. 2. Possible new small area of cortical scarring or inflammation in the interpolar region of the right kidney. No evidence of urinary tract calculus or hydronephrosis. 3. Stable mild intra and extrahepatic biliary dilatation status post cholecystectomy, within physiologic limits. 4. Stable chronic central airway thickening and mucous plugging at both lung bases. 5. Stable chronically thrombosed splenic artery aneurysm. 6.  Aortic Atherosclerosis (ICD10-I70.0). Electronically Signed   By: WRichardean SaleM.D.   On: 02/22/2022 11:46     Procedures .Critical Care  Performed by: FJacqlyn Larsen PA-C Authorized by: FJacqlyn Larsen PA-C   Critical care provider statement:    Critical care time (minutes):  60   Critical care was necessary to treat or prevent imminent or life-threatening deterioration of the following conditions: A-fib RVR in the setting of ongoing diarrhea and dehydration.   Critical care was time spent personally by me on the following activities:  Development of treatment plan with patient or surrogate, discussions with consultants, evaluation of patient's response  to treatment, examination of patient, ordering and review of laboratory studies, ordering and review of radiographic studies, ordering and performing treatments and interventions, pulse oximetry, re-evaluation of patient's condition and review of old charts   Care discussed with: admitting provider       Medications Ordered in ED Medications  sodium chloride 0.9 % bolus 1,000 mL (0 mLs Intravenous Stopped 02/22/22 1050)  dicyclomine (BENTYL) capsule 20 mg (20 mg Oral Given 02/22/22 0952)  magnesium sulfate IVPB 2 g 50 mL (0 g Intravenous Stopped 02/22/22 1050)  metoprolol tartrate (LOPRESSOR) injection 5 mg (5 mg Intravenous Given 02/22/22 1028)  iohexol (OMNIPAQUE) 350 MG/ML injection 75 mL (75 mLs Intravenous Contrast Given 02/22/22 1107)  sodium chloride 0.9 % bolus 1,000 mL (0 mLs Intravenous Stopped 02/22/22 1347)  potassium chloride 10 mEq in 100 mL IVPB (0 mEq Intravenous Stopped 02/22/22 1420)    ED Course/ Medical Decision Making/ A&P                           Medical Decision Making Amount and/or Complexity of Data Reviewed Labs: ordered. Radiology: ordered.  Risk Prescription drug management. Decision regarding hospitalization.   86 y.o. female presents to the ED with complaints of diarrhea, this involves an extensive number of treatment options, and is a complaint that carries with it a high risk of complications and  morbidity.  The differential diagnosis includes infectious colitis, C. difficile, inflammatory colitis, electrolyte derangement, dehydration  On arrival pt is ill-appearing but not in acute distress, initially vital stable.  Additional history obtained from and at bedside. Previous records obtained and reviewed including outpatient GI follow-up and multiple recent admissions for similar  I ordered medication including IV fluids metoprolol and Bentyl  Lab Tests:  I Ordered, reviewed, and interpreted labs, which included: No leukocytosis, stable hemoglobin, mild hyponatremia and hypokalemia, despite ongoing diarrhea fortunately renal function is preserved.  C. difficile quick screen positive for C. difficile antigen, but negative for C. difficile toxin, PCR needed for confirmation  Imaging Studies ordered:  I ordered imaging studies which included CT abdomen pelvis, I independently visualized and interpreted imaging which showed pancolitis  ED Course:   After my initial evaluation I was called to bedside by nursing staff as patient became tachycardic with heart rates in the 140s-150s, patient's systolic blood pressure in the low 100s, she has not had her home metoprolol and on top of this is likely quite dry due to ongoing diarrhea, will give IV metoprolol 5 mg and fluid bolus and continue to monitor heart rate closely.  Heart rate improved in the 100s-120s after metoprolol and fluids, heart rate did start to increase slightly again, will give additional fluids prior to starting additional rate controlling medications due to soft BPs.  Patient is asymptomatic with tachycardia.  Has had similar episodes of A-fib RVR while admitted for issues with diarrhea.  I sent message to [facility] GI team for nonemergent consult for this patient that they have been following due to ongoing diarrhea.  They will see and evaluate patient.  C. difficile confirmatory testing is still pending so we will hold off on  consulting infectious disease at this time.  I requested consultation with hospitalist for admission, case discussed with Dr. Karmen Bongo who will see and admit the patient.    Portions of this note were generated with Lobby[employer]. Dictation errors may occur despite best attempts at proofreading.  Final Clinical Impression(s) / ED Diagnoses Final diagnoses:  Diarrhea, unspecified type  Atrial fibrillation with RVR [facility])    Rx / DC Orders ED Discharge Orders     None         Jacqlyn Larsen, [place] 03/29/22 1719    Elgie Congo, MD 04/01/22 [phone]

## 2022-02-22 NOTE — ED Notes (Addendum)
This RN notified PA Ford about continuous heart rate around 140's. No new orders at this time.

## 2022-02-22 NOTE — ED Notes (Signed)
PA,WAS AT [facility] WITH PATIENT ABOUT HER CARE

## 2022-02-22 NOTE — Consult Note (Addendum)
Consultation  Referring Provider: TRH/ Yates Primary Care Physician:  Cassandria Anger, MD Primary Gastroenterologist:  Dr.Stark  Reason for Consultation: Recurrent C. difficile colitis  HPI: Julie Montgomery is a 86 y.o. female with Dr. Fuller Plan with history of multiple episodes of recurrent C. difficile colitis.  She had an episode in 2016, again in 2018, then was admitted in August 2023 with acute C. difficile colitis after a course of Keflex for an infected wound from a skin cancer which had been removed by dermatology.  At that time she was treated with a 10-day course of Dificid.  Had improvement in symptoms, not clear that she had total resolution of symptoms and then was readmitted 9/14 through 01/13/2022 with recurrent diarrhea.  At that time C. difficile antigen positive, PCR positive, toxin negative. She had CT of the abdomen pelvis that did show diffuse colonic inflammatory changes.  ID was consulted without admission and she was treated with another course of Dificid x10 days. Sounds as if she had another course of antibiotics in the interim and then was readmitted 02/07/2022 again with recurrent diarrhea.  Without admission C. difficile antigen negative, toxin negative and PCR negative.  He did not have repeat imaging.  She was briefly placed on Dificid and then that was discontinued when C. difficile studies were negative.  She was seen by GI in consultation and ultimately was put on a course of Entocort 9 mg daily with gradual taper to help with residual colitis. Her husband reports they have still been using the Entocort, her down to 6 mg daily currently and she has also been on 2 probiotics, align and Florastor. She had been having soft stools it sounds like and then over the past 5 to 6 days has had recurrence of looser stools which have become progressively more yellow and frequent. On Tuesday, 02/20/2022 she had a large episode of incontinence of watery stool.  Her husband  reports that she is now having diarrhea after anything that she tries to eat or drink.  She really has not had much to eat or drink over the past 3 days.  She was able to eat 1 egg yesterday, then had pain of her crackers in the afternoon and vomited.  She feels weak.  They are not aware of any fever or chills no bleeding, she has had abdominal tenderness and feels somewhat bloated or distended in her abdomen.  She was seen by her PCP earlier this week darted back on Imodium up to 6/day per GI and then PCP was concerned about abdominal distention so dosage was decreased. He ultimately came to the emergency room last night she was found to be in A-fib with RVR.  Current heart rate 150. Labs show WBC of 3.0, hemoglobin 11.2/hematocrit 34.5 Sodium 129/potassium 3.8/BUN 22/creatinine 0.57, LFTs within normal limits  C. difficile quick screen with positive C. difficile antigen negative toxin and PCR has now returned positive.  CT abdomen and pelvis done today-stable mild intra and extrahepatic biliary dilation status post cholecystectomy, no small bowel distention or wall thickening, there is progressive moderate dilation of the proximal to mid colon with mild wall thickening and mucosal hyperenhancement and scattered air-fluid levels in the cecum located in the central upper abdomen, distal colon decompressed but noted persistent wall thickening and surrounding inflammation suspicious for diffuse colitis, no pneumatosis or perforation  ID is evaluating her today as well and per discussions agreement to start her on a long tapered course of vancomycin  which she has not had as yet.  Patient does have history of a prior TIA, osteopenia, prior history of H. pylori gastritis, adenomatous colon polyps, bronchiectasis, and A-fib for which she is on Eliquis. Colonoscopy in November 2020 for weight loss at that time had one 7 mm sessile polyp removed which was a tubular adenoma and noted multiple left colon  diverticuli. EGD November 2020 with small hiatal hernia, gastritis which was H. pylori positive.    Past Medical History:  Diagnosis Date   Adenomatous polyp of colon 2020   Allergy    Anxiety    Basal cell carcinoma (BCC)    Cataract    Depression    Helicobacter pylori gastritis    Osteopenia    Pneumonia    hx of 03/2008   Stroke [facility])    TIA long time ago    Past Surgical History:  Procedure Laterality Date   APPENDECTOMY     BREAST LUMPECTOMY     right   CATARACT EXTRACTION W/ INTRAOCULAR LENS  IMPLANT, BILATERAL Bilateral    CESAREAN SECTION     CHOLECYSTECTOMY     MOHS SURGERY     PARTIAL HYSTERECTOMY     ROTATOR CUFF REPAIR  2012   SKIN GRAFT     teeth remmoved      Prior to Admission medications   Medication Sig Start Date End Date Taking? Authorizing Provider  apixaban (ELIQUIS) 2.5 MG TABS tablet Take 2.5 mg by mouth 2 (two) times daily.    [provider]  budesonide (ENTOCORT EC) 3 MG 24 hr capsule Take 3 capsules (9 mg total) by mouth daily. 02/12/22 03/14/22  Caren Griffins, MD  dicyclomine (BENTYL) 10 MG capsule Take 1 capsule (10 mg total) by mouth 3 (three) times daily before meals. 02/12/22 03/14/22  Caren Griffins, MD  loperamide (IMODIUM) 2 MG capsule Take by mouth as needed for diarrhea or loose stools.    [provider]  Probiotic Product (ALIGN) 4 MG CAPS Take 1 capsule (4 mg total) by mouth daily. 12/26/21   Plotnikov, Evie Lacks, MD  saccharomyces boulardii (FLORASTOR) 250 MG capsule Take 1 capsule (250 mg total) by mouth 2 (two) times daily. 02/12/22 03/14/22  Caren Griffins, MD  VITAMIN D PO Take 1 tablet by mouth daily.    [provider]    No current facility-administered medications for this encounter.   Current Outpatient Medications  Medication Sig Dispense Refill   apixaban (ELIQUIS) 2.5 MG TABS tablet Take 2.5 mg by mouth 2 (two) times daily.     budesonide (ENTOCORT EC) 3 MG 24 hr capsule Take  3 capsules (9 mg total) by mouth daily. 90 capsule 0   dicyclomine (BENTYL) 10 MG capsule Take 1 capsule (10 mg total) by mouth 3 (three) times daily before meals. 90 capsule 0   loperamide (IMODIUM) 2 MG capsule Take by mouth as needed for diarrhea or loose stools.     Probiotic Product (ALIGN) 4 MG CAPS Take 1 capsule (4 mg total) by mouth daily. 30 capsule 1   saccharomyces boulardii (FLORASTOR) 250 MG capsule Take 1 capsule (250 mg total) by mouth 2 (two) times daily. 60 capsule 0   VITAMIN D PO Take 1 tablet by mouth daily.      Allergies as of 02/21/2022 - Review Complete 02/21/2022  Allergen Reaction Noted   Lovastatin Other (See Comments)    Mometasone furo-formoterol fum Other (See Comments) 12/08/2010   Prozac [fluoxetine hcl]  Other (See Comments) 12/13/2021   Sulfa antibiotics Other (See Comments) 03/01/2012    Family History  Problem Relation Age of Onset   Diabetes Sister    Cancer Sister        lung ca   Coronary artery disease Brother    Dementia Brother    Cancer Brother        lung ca   Stomach cancer Paternal Grandmother    Colon cancer Son    Breast cancer Neg Hx    Esophageal cancer Neg Hx    Rectal cancer Neg Hx     Social History   Socioeconomic History   Marital status: Married    Spouse name: Not on file   Number of children: 4   Years of education: Not on file   Highest education level: Not on file  Occupational History   Occupation: Oceanographer  Tobacco Use   Smoking status: Never   Smokeless tobacco: Never  Vaping Use   Vaping Use: Never used  Substance and Sexual Activity   Alcohol use: No   Drug use: No   Sexual activity: Not Currently  Other Topics Concern   Not on file  Social History Narrative   Married, Retired, she was looking after her GGD, now in court battle   Social Determinants of Health   Financial Resource Strain: [place]  (02/27/2021)   Overall Financial Resource Strain (CARDIA)    Difficulty of Paying  Living Expenses: Not hard at all  Food Insecurity: No Food Insecurity (02/08/2022)   Hunger Vital Sign    Worried About Running Out of Food in the Last Year: Never true    [place] in the Last Year: Never true  Transportation Needs: No Transportation Needs (02/08/2022)   PRAPARE - Hydrologist (Medical): No    Lack of Transportation (Non-Medical): No  Physical Activity: Inactive (02/27/2021)   Exercise Vital Sign    Days of Exercise per Week: 0 days    Minutes of Exercise per Session: 0 min  Stress: Stress Concern Present (02/27/2021)   [place]    Feeling of Stress : Very much  Social Connections: Socially Integrated (02/27/2021)   Social Connection and Isolation Panel [NHANES]    Frequency of Communication with Friends and Family: More than three times a week    Frequency of Social Gatherings with Friends and Family: More than three times a week    Attends Religious Services: More than 4 times per year    Active Member of [employer] or Organizations: Yes    Attends Music therapist: More than 4 times per year    Marital Status: Married  Human resources officer Violence: Not At Risk (02/07/2022)   Humiliation, Afraid, Rape, and Kick questionnaire    Fear of Current or Ex-Partner: No    Emotionally Abused: No    Physically Abused: No    Sexually Abused: No    Review of Systems: Pertinent positive and negative review of systems were noted in the above HPI section.  All other review of systems was otherwise negative.   Physical Exam: Vital signs in last 24 hours: Temp:  [97.7 F (36.5 C)-98.9 F (37.2 C)] 97.7 F (36.5 C) (10/26 1214) Pulse Rate:  [71-128] 113 (10/26 1320) Resp:  [16-26] 16 (10/26 1320) BP: (94-144)/(48-78) 96/56 (10/26 1320) SpO2:  [93 %-99 %] 97 % (10/26 1320) Weight:  [41.3 kg] 41.3 kg (  10/25 1507)   General:   Alert,  Well-developed, frail-appearing  elderly white female pleasant and cooperative in NAD.  Husband at bedside Head:  Normocephalic and atraumatic. Eyes:  Sclera clear, no icterus.   Conjunctiva pink. Ears:  Normal auditory acuity. Nose:  No deformity, discharge,  or lesions. Mouth:  No deformity or lesions.   Neck:  Supple; no masses or thyromegaly. Lungs:  Clear throughout to auscultation.   No wheezes, crackles, or rhonchi. Heart: Tachy irregular regular rate and rhythm; no murmurs, clicks, rubs,  or gallops. Abdomen:  Soft, mild tenderness across the lower abdomen no rebound mild distention BS active,nonpalp mass or hsm.   Rectal: Not done Msk:  Symmetrical without gross deformities. . Pulses:  Normal pulses noted. Extremities:  Without clubbing or edema. Neurologic:  Alert and  oriented x4;  grossly normal neurologically. Skin:  Intact without significant lesions or rashes.. Psych:  Alert and cooperative. Normal mood and affect.  Intake/Output from previous day: No intake/output data recorded. Intake/Output this shift: No intake/output data recorded.  Lab Results: Recent Labs    02/21/22 1541  WBC 3.0*  HGB 11.2*  HCT 34.5*  PLT 193   BMET Recent Labs    02/21/22 1541 02/22/22 1026  NA 129* 130*  K 3.8 3.3*  CL 96* 97*  CO2 22 21*  GLUCOSE 102* 85  BUN 22 18  CREATININE 0.57 0.69  CALCIUM 9.1 8.9   LFT Recent Labs    02/21/22 1541  PROT 5.6*  ALBUMIN 2.8*  AST 25  ALT 20  ALKPHOS 73  BILITOT 0.9    IMPRESSION:  #82 86 year old white female with multiple recurrent episodes of C. difficile colitis.  Patient had admission in August, and again in September with relapse versus recurrent C. difficile, both episodes treated with Dificid x10 days. She was admitted about 2 weeks ago with recurrent diarrhea, however with that admission C. difficile antigen, toxin and PCR all negative She had been noted to have rather diffuse inflammatory changes of the colon on imaging and was started on Entocort 9  mg daily for colitis symptoms.  Presents again with recurrence of diarrhea within the past 4 to 5 days, large episode of incontinent diarrhea 2 days ago, poor oral intake, vomited yesterday C. difficile studies are positive with positive C. difficile PCR, C. difficile antigen and  neg toxin  CT imaging shows some mild dilation of the proximal to mid colon and persistent wall thickening particularly of the distal colon consistent with diffuse colitis, no pneumatosis  Patient is nontoxic-appearing ,no leukocytosis  #2 history of atrial fibrillation, currently A-fib with RVR heart rate 150 #3 prior TIA #4.  Bronchiectasis #5. Hx of adenomatous colon polyps and diverticulosis, last colonoscopy November 2020 #6 status post cholecystectomy  #7 mild dementia #8  Hypokalemia correcting  PLAN: Soft bland diet, add boost supplements Start vancomycin 125 mg p.o. 4 times daily x2 weeks, then will gradually taper over 6 to 8 weeks and that can be stretched out for longer duration if needed. Continue to use Bentyl 10 mg every 6 hours as needed for abdominal cramping  Stop Entocort Stop probiotics while on antibiotics Goal will be  towards referral to [facility] or [facility] for fecal transplant which hopefully will be able to be arranged while she is continuing on a slow vancomycin taper    Amy Esterwood PA-C 02/22/2022, 2:56 PM     [facility] Attending   I have taken an interval history, reviewed the chart  and examined the patient. I agree with the Advanced Practitioner's note, impression and recommendations.  Majority the medical decision-making in the formulation of the assessment and plan were performed by me.  Recurrent Clostridioides difficile colitis.  Long vancomycin taper and outpatient referral for fecal transplant as above.  We will follow at least briefly.  Gatha Mayer, MD, [facility] See Shea Evans on call - gastroenterology for best contact person 02/22/2022 4:36 PM

## 2022-02-22 NOTE — ED Notes (Signed)
WALKED PATIENT BACK FROM BATHROOM  HOOKED HER UP TO MONITIOR  PATIENT HEARTRATE WAS IN 170"S .NOTIFIED RN.

## 2022-02-22 NOTE — Consult Note (Signed)
[place] [place] for Infectious Disease    Date of Admission:  02/21/2022     Total days of antibiotics 1               Reason for Consult: Clostridium Dificile   Referring Provider: Dr. Lorin Mercy  Primary Care Provider: Plotnikov, Evie Lacks, MD   ASSESSMENT:  [name] is an 86 y/o female with probably recurrent C. Difficile colitis with no specific triggers. Given this is her 4th recurrence of colitis will start on pulsed vancomycin taper. As previously noted Zinplava not likely to have much benefit as she is toxin negative. At this point would recommend vancomycin taper followed by referral to [facility] for consideration of FMT. GI will help facilitate referral. In the interim being admitted with A. Fib with RVR. Will start vancomycin taper with Vancomycin 125 mg PO qid for 14 days; then Vancomycin 125 mg PO bid for 7 days; then Vancomycin 125 mg PO daily for 7 days; then Vancomycin 125 mg PO every other day for 28 days; and then Vancomycin 125 mg PO every 3 days for 28 days. Placed on Enteric precautions. Remaining medical and supportive care per primary team.   PLAN:  Vancomycin taper for C. Difficile infection as above. A. Fib with RVR treatment per primary team. GI assist with referral for possible FMT. Remaining medical and supportive care per primary team.    Principal Problem:   Recurrent colitis due to Clostridioides difficile    vancomycin  125 mg Oral QID   Followed by   Derrill Memo ON 03/08/2022] vancomycin  125 mg Oral BID   Followed by   Derrill Memo ON 03/16/2022] vancomycin  125 mg Oral Daily   Followed by   Derrill Memo ON 03/23/2022] vancomycin  125 mg Oral QODAY   Followed by   Derrill Memo ON 04/20/2022] vancomycin  125 mg Oral Q3 days     HPI: Julie Montgomery is a 86 y.o. female with previous medical history of depression/anxiety, TIA, osteopenia, basal cell carcinoma, H. Pylori, and recent C. Difficile infection presenting with diarrhea.   Julie Montgomery is  known to the ID service having been seen during a recent hospitalization for diarrhea. She had received one dose of ceftriaxone and metronidazole and was started on Dificid for C. Difficile colitis. Has a history of C. Difficile back in 2016 with recurrence in 2018 and August 2023 (triggered following cephalexin for 10 days realted to Mohs surgery). C. Diff testing was negative and Dificid was discontinued. Started on Bentyl, probiotic and Entocort with GI follow up.   Following discharge Julie Montgomery begain having loose, watery stools about 1 week after discharge. GI recommended continuing prescribed medications on 02/19/22 and seen by Internal Medicine on 02/20/22 with recommendations to continue previous recommendations and use immodium. Presenting to the ED with loose, watery stools and lower abdominal pain. Husband notes 5-6 bowel movements per day and can be exacerbated whenever she eats something.  CT abdomen/pelvis with persistent findings of diffuse colitis. C. Diff testing was positive for antigen and PCR and negative for toxin, similar to previous test results. Started on oral vancomycin. Course complicated by Afib with RVR.    Review of Systems: Review of Systems  Constitutional:  Negative for chills, fever and weight loss.  Respiratory:  Negative for cough, shortness of breath and wheezing.   Cardiovascular:  Negative for chest pain and leg swelling.  Gastrointestinal:  Positive for abdominal pain and diarrhea. Negative for constipation, nausea and  vomiting.  Skin:  Negative for rash.     Past Medical History:  Diagnosis Date   Adenomatous polyp of colon 2020   Allergy    Anxiety    Basal cell carcinoma (BCC)    Cataract    Depression    Helicobacter pylori gastritis    Osteopenia    Pneumonia    hx of 03/2008   Stroke [facility])    TIA long time ago    Social History   Tobacco Use   Smoking status: Never   Smokeless tobacco: Never  Vaping Use   Vaping Use: Never used   Substance Use Topics   Alcohol use: No   Drug use: No    Family History  Problem Relation Age of Onset   Diabetes Sister    Cancer Sister        lung ca   Coronary artery disease Brother    Dementia Brother    Cancer Brother        lung ca   Stomach cancer Paternal Grandmother    Colon cancer Son    Breast cancer Neg Hx    Esophageal cancer Neg Hx    Rectal cancer Neg Hx     Allergies  Allergen Reactions   Lovastatin Other (See Comments)    Unknown reaction   Mometasone Furo-Formoterol Fum Other (See Comments)    Loss of appetite, laryngitis    Prozac [Fluoxetine Hcl] Other (See Comments)    Jumpy   Sulfa Antibiotics Other (See Comments)    Unknown reaction    OBJECTIVE: Blood pressure (!) 96/56, pulse (!) 113, temperature 97.7 F (36.5 C), temperature source Oral, resp. rate 16, height '5\' 2"'$  (1.575 m), weight 41.3 kg, SpO2 97 %.  Physical Exam Constitutional:      General: She is not in acute distress.    Appearance: She is well-developed.  Cardiovascular:     Rate and Rhythm: Tachycardia present. Rhythm irregularly irregular.     Heart sounds: Normal heart sounds.  Pulmonary:     Effort: Pulmonary effort is normal.     Breath sounds: Normal breath sounds.  Skin:    General: Skin is warm and dry.  Neurological:     Mental Status: She is alert and oriented to person, place, and time.     Lab Results Lab Results  Component Value Date   WBC 3.0 (L) 02/21/2022   HGB 11.2 (L) 02/21/2022   HCT 34.5 (L) 02/21/2022   MCV 94.0 02/21/2022   PLT 193 02/21/2022    Lab Results  Component Value Date   CREATININE 0.69 02/22/2022   BUN 18 02/22/2022   NA 130 (L) 02/22/2022   K 3.3 (L) 02/22/2022   CL 97 (L) 02/22/2022   CO2 21 (L) 02/22/2022    Lab Results  Component Value Date   ALT 20 02/21/2022   AST 25 02/21/2022   ALKPHOS 73 02/21/2022   BILITOT 0.9 02/21/2022     Microbiology: Recent Results (from the past 240 hour(s))  C Difficile Quick  Screen w PCR reflex     Status: Abnormal   Collection Time: 02/22/22  8:38 AM   Specimen: STOOL  Result Value Ref Range Status   C Diff antigen POSITIVE (A) NEGATIVE Final   C Diff toxin NEGATIVE NEGATIVE Final   C Diff interpretation Results are indeterminate. See PCR results.  Final    Comment: Performed at [facility], [place] [address] [place], [place] [address]  C.  Diff by PCR, Reflexed     Status: Abnormal   Collection Time: 02/22/22  8:38 AM  Result Value Ref Range Status   Toxigenic C. Difficile by PCR POSITIVE (A) NEGATIVE Final    Comment: Positive for toxigenic C. difficile with little to no toxin production. Only treat if clinical presentation suggests symptomatic illness. Performed at [facility], [place] [address] [place], [place] [address]      Terri Piedra, [facility]  02/22/2022  3:27 PM

## 2022-02-22 NOTE — ED Notes (Signed)
Pt heart rate around 180's.  This RN notified and at bedside with provider.

## 2022-02-22 NOTE — ED Notes (Signed)
This RN notified MD Lorin Mercy about pt heart rate around 150's.  Pt not symptomatic at this time.  No new orders.

## 2022-02-22 NOTE — H&P (Signed)
History and Physical    Patient: Julie Montgomery DOB: April 07, 1936 DOA: 02/21/2022 DOS: the patient was seen and examined on 02/22/2022 PCP: Plotnikov, Evie Lacks, MD  Patient coming from: Home - lives with husband; NOK: Luan, Urbani, [phone]   Chief Complaint: Diarrhea  HPI: Julie Montgomery is a 86 y.o. female with medical history significant of remote TIA, afib on Eliquis, and h/o C difficile colitis presenting with diarrhea. She was initially admitted from 8/15-21 with diarrhea/weakness after taking Keflex for a possible early cellulitis.  She was positive for C diff colitis and it was noted to be here 3rd episode in the last 2 years so she was treated with Dificid with resolution of symptoms.  During that hospitalization, she was noted to have new-onset afib with RVR and cardiology consulted, starting amiodarone -> metoprolol and heparin -> Eliquis.  She returned to the ER on 8/27 with chest pain and CP A/P was negative; she responded to IV Lasix and was discharged.  She returned to the ER on 9/12 with diarrhea; stool sample was not obtained because the diarrhea resolved and she was discharged.  She returned again the following day with similar presentation and was again discharged.  She was readmitted from 9/14-16 with recurrent C diff colitis and treated with more Dificid.  She returned once again from 10/10-16 with diarrhea but C diff testing was negative at that time.  It was discussed with ID and GI, who recommended no abx for treatment of postinfectious colitis and she was started on Bentyl, probiotics, and budesonide.  She reports that as long as she was not attempting to eat or drink, she did fine.  However, with any PO intake she has had copious and explosive diarrhea.    ER Course:  Multiple C diff infections, admitted multiple times with diarrhea.  Discharged on 10/16, thought to have post-inflammatory colitis, C diff negative.  Woke up with diarrhea 3 days  ago, C diff pending.  ID has seen previously.  CT with pancolitis.  Also with afib with RVR (has had during prior hospitalizations, on Eliquis).  BPs low 100s, given IVF and metoprolol.  HR improved to 110-120s.  Given more IVF.     Review of Systems: As mentioned in the history of present illness. All other systems reviewed and are negative. Past Medical History:  Diagnosis Date   Adenomatous polyp of colon 2020   Allergy    Anxiety    Basal cell carcinoma (BCC)    Cataract    Depression    Helicobacter pylori gastritis    Osteopenia    Pneumonia    hx of 03/2008   Stroke [facility])    TIA long time ago   Past Surgical History:  Procedure Laterality Date   APPENDECTOMY     BREAST LUMPECTOMY     right   CATARACT EXTRACTION W/ INTRAOCULAR LENS  IMPLANT, BILATERAL Bilateral    CESAREAN SECTION     CHOLECYSTECTOMY     MOHS SURGERY     PARTIAL HYSTERECTOMY     ROTATOR CUFF REPAIR  2012   SKIN GRAFT     teeth remmoved     Social History:  reports that she has never smoked. She has never used smokeless tobacco. She reports that she does not drink alcohol and does not use drugs.  Allergies  Allergen Reactions   Lovastatin Other (See Comments)    Unknown reaction   Mometasone Furo-Formoterol Fum Other (See Comments)  Loss of appetite, laryngitis    Prozac [Fluoxetine Hcl] Other (See Comments)    Jumpy   Sulfa Antibiotics Other (See Comments)    Unknown reaction    Family History  Problem Relation Age of Onset   Diabetes Sister    Cancer Sister        lung ca   Coronary artery disease Brother    Dementia Brother    Cancer Brother        lung ca   Stomach cancer Paternal Grandmother    Colon cancer Son    Breast cancer Neg Hx    Esophageal cancer Neg Hx    Rectal cancer Neg Hx     Prior to Admission medications   Medication Sig Start Date End Date Taking? Authorizing Provider  apixaban (ELIQUIS) 2.5 MG TABS tablet Take 2.5 mg by mouth 2 (two) times daily.     [provider]  budesonide (ENTOCORT EC) 3 MG 24 hr capsule Take 3 capsules (9 mg total) by mouth daily. 02/12/22 03/14/22  Caren Griffins, MD  dicyclomine (BENTYL) 10 MG capsule Take 1 capsule (10 mg total) by mouth 3 (three) times daily before meals. 02/12/22 03/14/22  Caren Griffins, MD  loperamide (IMODIUM) 2 MG capsule Take by mouth as needed for diarrhea or loose stools.    [provider]  Probiotic Product (ALIGN) 4 MG CAPS Take 1 capsule (4 mg total) by mouth daily. 12/26/21   Plotnikov, Evie Lacks, MD  saccharomyces boulardii (FLORASTOR) 250 MG capsule Take 1 capsule (250 mg total) by mouth 2 (two) times daily. 02/12/22 03/14/22  Caren Griffins, MD  VITAMIN D PO Take 1 tablet by mouth daily.    [provider]    Physical Exam: Vitals:   02/22/22 1700 02/22/22 1701 02/22/22 1702 02/22/22 1705  BP: 110/74   110/67  Pulse: 98 (!) 131  (!) 120  Resp: (!) 21 (!) 32 (!) 35 (!) 22  Temp:      TempSrc:      SpO2: 95% 94%  95%  Weight:      Height:       General:  Appears calm and comfortable and is in NAD, frail, very thin Eyes:  PERRL, EOMI, normal lids, iris ENT:  grossly normal hearing, lips & tongue, moderately dry mm Neck:  no LAD, masses or thyromegaly Cardiovascular:  Irregularly irregular with tachycardia, no m/r/g. No LE edema.  Respiratory:   CTA bilaterally with no wheezes/rales/rhonchi.  Normal respiratory effort. Abdomen:  soft, NT, ND Skin:  no rash or induration seen on limited exam Musculoskeletal:  grossly normal tone BUE/BLE, good ROM, no bony abnormality Psychiatric:  blunted mood and affect, speech fluent and appropriate, AOx3 Neurologic:  CN 2-12 grossly intact, moves all extremities in coordinated fashion   Radiological Exams on Admission: Independently reviewed - see discussion in A/P where applicable  CT ABDOMEN PELVIS W CONTRAST  Result Date: 02/22/2022 CLINICAL DATA:  Diffuse abdominal pain with diarrhea for  several days. EXAM: CT ABDOMEN AND PELVIS WITH CONTRAST TECHNIQUE: Multidetector CT imaging of the abdomen and pelvis was performed using the standard protocol following bolus administration of intravenous contrast. RADIATION DOSE REDUCTION: This exam was performed according to the departmental dose-optimization program which includes automated exposure control, adjustment of the mA and/or kV according to patient size and/or use of iterative reconstruction technique. CONTRAST:  57m OMNIPAQUE IOHEXOL 350 MG/ML SOLN COMPARISON:  Abdominopelvic CT 01/11/2022 and 12/24/2021. FINDINGS: Lower chest: Similar chronic central  airway thickening and mucous plugging at both lung bases. No airspace disease or suspicious nodularity. No pleural or pericardial effusion. There is a small hiatal hernia. Hepatobiliary: The liver is normal in density without suspicious focal abnormality. Stable mild intra and extrahepatic biliary dilatation status post cholecystectomy, within physiologic limits. Pancreas: Unremarkable. No pancreatic ductal dilatation or surrounding inflammatory changes. Spleen: Normal in size without focal abnormality. Adrenals/Urinary Tract: Both adrenal glands appear normal. No evidence of urinary tract calculus, hydronephrosis or perinephric soft tissue stranding. A simple cyst in the interpolar region of the left kidney is stable, for which no follow-up imaging recommended. Anteriorly in the interpolar region of the right kidney, there is a possible new small area of cortical scarring or inflammation without associated perinephric fluid collection. The bladder is moderately distended without focal abnormality. Stomach/Bowel: No enteric contrast administered. The stomach is decompressed. No small bowel distension, wall thickening or surrounding inflammation. Progressive moderate dilatation of the proximal to mid colon with mild wall thickening, mucosal hyperenhancement and scattered air-fluid levels. The cecum is  located in the central upper abdomen. The distal colon is decompressed with persistent wall thickening and surrounding inflammation, remaining suspicious for diffuse colitis. No evidence of pneumatosis or perforation. Vascular/Lymphatic: There are no enlarged abdominal or pelvic lymph nodes. Aortic and branch vessel atherosclerosis without acute vascular findings. Unchanged chronically thrombosed splenic artery aneurysm measuring 1.5 cm. Reproductive: Hysterectomy.  No adnexal mass is identified. Other: No ascites, pneumoperitoneum or focal extraluminal fluid collections identified. Musculoskeletal: No acute or significant osseous findings. Lower lumbar facet arthropathy. IMPRESSION: 1. Persistent findings of diffuse colitis in this patient with a history of C difficile colitis. Progressive moderate dilatation of the proximal to mid colon without evidence of obstruction or perforation. 2. Possible new small area of cortical scarring or inflammation in the interpolar region of the right kidney. No evidence of urinary tract calculus or hydronephrosis. 3. Stable mild intra and extrahepatic biliary dilatation status post cholecystectomy, within physiologic limits. 4. Stable chronic central airway thickening and mucous plugging at both lung bases. 5. Stable chronically thrombosed splenic artery aneurysm. 6.  Aortic Atherosclerosis (ICD10-I70.0). Electronically Signed   By: Richardean Sale M.D.   On: 02/22/2022 11:46    EKG: Independently reviewed.  Afib with RVR with rate 197; nonspecific ST changes that may be rate related   Labs on Admission: I have personally reviewed the available labs and imaging studies at the time of the admission.  Pertinent labs:    Na++ 130 K+ 3.3 Albumin 2.8 WBC 3.0 Hgb 11.2 UA: small Hgb, 20 ketones, moderate LE C diff antigen +, toxin negative; further evaluation is pending   Assessment and Plan: Principal Problem:   Recurrent colitis due to Clostridioides  difficile Active Problems:   Paroxysmal atrial fibrillation with RVR (HCC)    Recurrent C diff colitis -Patient with recurrent hospitalizations for diarrhea with C diff colitis -C diff POSITIVE -Will admit to progressive care -Soft diet per GI -prn oxy/morphine for pain   -prn Zofran for nausea  -Has failed Dificid -ID and GI consulted -Will start PO Vanc per ID -Continue probiotics -Good hand-washing (rather than use of alcohol gel) will be critical to decrease the risk of spread.  -Patient appears likely to need outpatient referral to a tertiary care center for fecal transplantation consideration once her acute illness is improved -Will stop budesonide since this does not appear to be inflammatory but rather is infectious -She has been increasingly weak, will request PT/OT consults  Afib with  RVR -Patient presenting with recurrent afib with RVR which tends to occur during hospitalizations and is likely associated with dehydration -She was volume resuscitated and continues to have RVR.  -Will plan to admit to progressive care for Diltiazem drip as per protocol with plan to transition to PO Diltiazem once heart rate is controlled (resting HR 110 or lower).  -She was previously treated with amiodarone and this is a consideration if her BP will not tolerate Dilt -Echo on 8/17 with preserved EF, generally unremarkable -CHA2DS2-VASc Score is >2 and so patient would benefit from oral anticoagulation. There is evidence of net benefit even in the extremely elderly population. -Continue home Eliquis.  Malnutrition -Body mass index is 16.64 kg/m..  -The patient has at least 2 indicators for malnutrition (insufficient energy intake, weight loss, loss of muscle mass, loss of subcutaneous fat -This is likely due to starvation related/chronic disease -Will obtain a nutrition consult for further recommendations.      Advance Care Planning:   Code Status: Full Code   Consults: GI, ID; may  need cardiology depending on response to therapy; nutrition; TOC team; PT/OT  DVT Prophylaxis: Eliquis  Family Communication: Husband was present throughout evaluation  Severity of Illness: The appropriate patient status for this patient is INPATIENT. Inpatient status is judged to be reasonable and necessary in order to provide the required intensity of service to ensure the patient's safety. The patient's presenting symptoms, physical exam findings, and initial radiographic and laboratory data in the context of their chronic comorbidities is felt to place them at high risk for further clinical deterioration. Furthermore, it is not anticipated that the patient will be medically stable for discharge from the hospital within 2 midnights of admission.   * I certify that at the point of admission it is my clinical judgment that the patient will require inpatient hospital care spanning beyond 2 midnights from the point of admission due to high intensity of service, high risk for further deterioration and high frequency of surveillance required.*  Author: Karmen Bongo, MD 02/22/2022 5:42 PM  For on call review [email or web address].

## 2022-02-23 DIAGNOSIS — A0471 Enterocolitis due to Clostridium difficile, recurrent: Secondary | ICD-10-CM | POA: Diagnosis not present

## 2022-02-23 LAB — CBC WITH DIFFERENTIAL/PLATELET
Abs Immature Granulocytes: 0 10*3/uL (ref 0.00–0.07)
Basophils Absolute: 0 10*3/uL (ref 0.0–0.1)
Basophils Relative: 0 %
Eosinophils Absolute: 0.1 10*3/uL (ref 0.0–0.5)
Eosinophils Relative: 1 %
HCT: 35.1 % — ABNORMAL LOW (ref 36.0–46.0)
Hemoglobin: 11.7 g/dL — ABNORMAL LOW (ref 12.0–15.0)
Lymphocytes Relative: 10 %
Lymphs Abs: 0.5 10*3/uL — ABNORMAL LOW (ref 0.7–4.0)
MCH: 30.6 pg (ref 26.0–34.0)
MCHC: 33.3 g/dL (ref 30.0–36.0)
MCV: 91.9 fL (ref 80.0–100.0)
Monocytes Absolute: 1.4 10*3/uL — ABNORMAL HIGH (ref 0.1–1.0)
Monocytes Relative: 26 %
Neutro Abs: 3.4 10*3/uL (ref 1.7–7.7)
Neutrophils Relative %: 63 %
Platelets: 249 10*3/uL (ref 150–400)
RBC: 3.82 MIL/uL — ABNORMAL LOW (ref 3.87–5.11)
RDW: 15.2 % (ref 11.5–15.5)
WBC: 5.4 10*3/uL (ref 4.0–10.5)
nRBC: 0 % (ref 0.0–0.2)
nRBC: 0 /100 WBC

## 2022-02-23 LAB — C-REACTIVE PROTEIN: CRP: 22.1 mg/dL — ABNORMAL HIGH (ref <1.0)

## 2022-02-23 LAB — COMPREHENSIVE METABOLIC PANEL
ALT: 20 U/L (ref 0–44)
AST: 23 U/L (ref 15–41)
Albumin: 2.6 g/dL — ABNORMAL LOW (ref 3.5–5.0)
Alkaline Phosphatase: 78 U/L (ref 38–126)
Anion gap: 9 (ref 5–15)
BUN: 17 mg/dL (ref 8–23)
CO2: 20 mmol/L — ABNORMAL LOW (ref 22–32)
Calcium: 9 mg/dL (ref 8.9–10.3)
Chloride: 103 mmol/L (ref 98–111)
Creatinine, Ser: 0.78 mg/dL (ref 0.44–1.00)
GFR, Estimated: >60 mL/min (ref >60)
Glucose, Bld: 102 mg/dL — ABNORMAL HIGH (ref 70–99)
Potassium: 4.3 mmol/L (ref 3.5–5.1)
Sodium: 132 mmol/L — ABNORMAL LOW (ref 135–145)
Total Bilirubin: 1 mg/dL (ref 0.3–1.2)
Total Protein: 5.5 g/dL — ABNORMAL LOW (ref 6.5–8.1)

## 2022-02-23 LAB — MAGNESIUM: Magnesium: 1.8 mg/dL (ref 1.7–2.4)

## 2022-02-23 MED ORDER — DILTIAZEM HCL 60 MG PO TABS: 30.0000 mg | ORAL_TABLET | Freq: Three times a day (TID) | ORAL | Status: DC

## 2022-02-23 MED ORDER — ADULT MULTIVITAMIN W/MINERALS CH
1.0000 | ORAL_TABLET | Freq: Every day | ORAL | Status: DC
  Administered 2022-02-23 – 2022-02-28 (×6): 1 via ORAL
  Filled 2022-02-23 (×6): qty 1

## 2022-02-23 MED ORDER — DILTIAZEM HCL ER COATED BEADS 120 MG PO CP24
240.0000 mg | ORAL_CAPSULE | Freq: Every day | ORAL | Status: DC
  Administered 2022-02-23: 240 mg via ORAL
  Filled 2022-02-23: qty 2

## 2022-02-23 MED ORDER — ENSURE ENLIVE PO LIQD
237.0000 mL | Freq: Two times a day (BID) | ORAL | Status: DC
  Administered 2022-02-23 – 2022-02-28 (×6): 237 mL via ORAL

## 2022-02-23 MED ORDER — DILTIAZEM HCL 60 MG PO TABS
30.0000 mg | ORAL_TABLET | Freq: Three times a day (TID) | ORAL | Status: DC
  Administered 2022-02-24 – 2022-02-26 (×6): 30 mg via ORAL
  Filled 2022-02-23 (×7): qty 1

## 2022-02-23 MED FILL — Vancomycin HCl Cap 125 MG (Base Equivalent): ORAL | Qty: 1 | Status: AC

## 2022-02-23 NOTE — Evaluation (Signed)
Occupational Therapy Evaluation Patient Details Name: Julie Montgomery MRN: [id number] DOB: February 20, 1936 Today's Date: 02/23/2022   History of Present Illness Patient is an 86 yo female presenting to the ED with diarrhea on 02/22/22. Recent admissions in 8/23, 9/23, for C-diff other admission from 10/10-10/16/23 with same symptoms but C-diff negative. Found to be tachycardic in ED. Admitted with c-diff. PMH includes: TIA, afib on Eliquis   Clinical Impression   Prior to this admission, patient living with her spouse, independent in ADLs and mobility, and driving. Currently, patient presenting with decreased activity tolerance, min guard for transfers and ADLs, and deficits noted in upper level cognition. Of note, patient has had multiple admissions for c-diff and GI issues over the last 3 months, and endorses progressive weakness since those admissions. OT recommending HHOT at discharge to promote prior level of function. OT will continue to follow.      Recommendations for follow up therapy are one component of a multi-disciplinary discharge planning process, led by the attending physician.  Recommendations may be updated based on patient status, additional functional criteria and insurance authorization.   Follow Up Recommendations  Home health OT    Assistance Recommended at Discharge Intermittent Supervision/Assistance  Patient can return home with the following A little help with walking and/or transfers;A little help with bathing/dressing/bathroom;Assistance with cooking/housework;Direct supervision/assist for medications management;Direct supervision/assist for financial management;Assist for transportation;Help with stairs or ramp for entrance    Functional Status Assessment  Patient has had a recent decline in their functional status and demonstrates the ability to make significant improvements in function in a reasonable and predictable amount of time.  Equipment Recommendations   Other (comment) (Will continue to assess)    Recommendations for Other Services       Precautions / Restrictions Precautions Precautions: Fall Restrictions Weight Bearing Restrictions: No      Mobility Bed Mobility Overal bed mobility: Needs Assistance Bed Mobility: Supine to Sit     Supine to sit: Min guard     General bed mobility comments: up with NT in bathroom upon arrival    Transfers Overall transfer level: Needs assistance Equipment used: Rolling walker (2 wheels) Transfers: Sit to/from Stand Sit to Stand: Min guard           General transfer comment: min gaurd for safety, minimally impulsive, poor walker management throughout      Balance Overall balance assessment: Mild deficits observed, not formally tested                                         ADL either performed or assessed with clinical judgement   ADL Overall ADL's : Needs assistance/impaired Eating/Feeding: Set up;Sitting   Grooming: Set up;Standing   Upper Body Bathing: Supervision/ safety;Sitting   Lower Body Bathing: Minimal assistance;Sit to/from stand;Sitting/lateral leans   Upper Body Dressing : Set up;Sitting   Lower Body Dressing: Minimal assistance;Sitting/lateral leans;Sit to/from stand   Toilet Transfer: Minimal assistance;Regular Toilet;Rolling walker (2 wheels) Toilet Transfer Details (indicate cue type and reason): patient unsteady throughout, poor walker management Toileting- Clothing Manipulation and Hygiene: Min guard;Sitting/lateral lean;Sit to/from stand       Functional mobility during ADLs: Min guard;Rolling walker (2 wheels);Cueing for sequencing;Cueing for safety General ADL Comments: Patient presenting with decreased activity tolerance and need for increased assist with ADLs and transfers     Vision Baseline Vision/History: 1 Wears glasses Ability to  See in Adequate Light: 0 Adequate Patient Visual Report: No change from baseline        Perception     Praxis      Pertinent Vitals/Pain Pain Assessment Pain Assessment: No/denies pain     Hand Dominance     Extremity/Trunk Assessment Upper Extremity Assessment Upper Extremity Assessment: Generalized weakness   Lower Extremity Assessment Lower Extremity Assessment: Defer to PT evaluation   Cervical / Trunk Assessment Cervical / Trunk Assessment: Kyphotic (minimally)   Communication Communication Communication: No difficulties   Cognition Arousal/Alertness: Lethargic Behavior During Therapy: Flat affect, Anxious Overall Cognitive Status: Impaired/Different from baseline Area of Impairment: Attention, Memory, Following commands                   Current Attention Level: Alternating Memory: Decreased short-term memory Following Commands: Follows one step commands consistently, Follows multi-step commands inconsistently       General Comments: Patient is oriented, but deificts noted in upper level cognition, patient perseverating on transplant (but unable to tell OT what the transplant was) increased time to recall husband and sons phone number when requesting OT dial number for her     General Comments       Exercises     Shoulder Instructions      Home Living Family/patient expects to be discharged to:: Private residence Living Arrangements: Spouse/significant other Available Help at Discharge: Family Type of Home: House Home Access: Stairs to enter [employer] of Steps: 3 at garage without rail, 5-6 in the back with a [place]: One level     Bathroom Shower/Tub: Occupational psychologist: [place]: None          Prior Functioning/Environment Prior Level of Function : Independent/Modified Independent             Mobility Comments: IND ADLs Comments: IND        OT Problem List: Decreased strength;Decreased activity tolerance;Impaired balance (sitting and/or  standing);Decreased coordination;Decreased cognition;Decreased safety awareness;Decreased knowledge of use of DME or AE      OT Treatment/Interventions: Self-care/ADL training;Therapeutic exercise;Energy conservation;DME and/or AE instruction;Manual therapy;Therapeutic activities;Patient/family education;Balance training    OT Goals(Current goals can be found in the care plan section) Acute Rehab OT Goals Patient Stated Goal: to get all of this figured out OT Goal Formulation: With patient Time For Goal Achievement: 03/08/22 Potential to Achieve Goals: Good ADL Goals Pt Will Perform Lower Body Bathing: Independently Pt Will Perform Lower Body Dressing: Independently Pt Will Transfer to Toilet: Independently;ambulating Pt Will Perform Toileting - Clothing Manipulation and hygiene: Independently Additional ADL Goal #1: Patient will be able to complete functional task in standing for 3-5 minutes prior to needing seated rest break in order to promote increased activity tolerance. Additional ADL Goal #2: Patient will be able to complete 3 step trial making task or upper level cognitive task to promote independence and safety at home.  OT Frequency: Min 3X/week    Co-evaluation              AM-PAC OT "6 Clicks" Daily Activity     Outcome Measure Help from another person eating meals?: None Help from another person taking care of personal grooming?: None Help from another person toileting, which includes using toliet, bedpan, or urinal?: A Little Help from another person bathing (including washing, rinsing, drying)?: A Little Help from another person to put on and taking off regular upper body clothing?: None Help  from another person to put on and taking off regular lower body clothing?: A Little 6 Click Score: 21   End of Session Equipment Utilized During Treatment: Rolling walker (2 wheels) Nurse Communication: Mobility status  Activity Tolerance: Patient limited by fatigue;Patient  limited by lethargy Patient left: in bed;with call bell/phone within reach;with bed alarm set  OT Visit Diagnosis: Unsteadiness on feet (R26.81);Muscle weakness (generalized) (M62.81)                Time: 8628-2417 OT Time Calculation (min): 17 min Charges:  OT General Charges $OT Visit: 1 Visit OT Evaluation $OT Eval Moderate Complexity: 1 Mod  Corinne Ports E. Karla Vines, OTR/L Acute Rehabilitation Services [phone]   Ascencion Dike 02/23/2022, 9:31 AM

## 2022-02-23 NOTE — Progress Notes (Signed)
Physical Therapy Evaluation Patient Details Name: Julie Montgomery MRN: [id number] DOB: 09-Jul-1935 Today's Date: 02/23/2022  History of Present Illness  Patient is an 86 yo female presenting to the ED with diarrhea on 02/22/22. Recent admissions in 8/23, 9/23, for C-diff other admission from 10/10-10/16/23 with same symptoms but C-diff negative. Found to be tachycardic in ED. Admitted with c-diff. PMH includes: TIA, afib on Eliquis  Clinical Impression  Pt was seen for mobiltiy to get up on walker and progress with gait and observe tolerances to move.  Pt is maintaining her sats and HR, and her stability is improved by using RW vs dependence on IV pole.  Pt is expecting to get home with husband but recommending HHPT for strengthening and balance training with her gradual changes in endurance and strength since beginning of her GI imbalances.  Pt is discussing a transplant at another hospital, apparently to do a  fecal transplant.  Her plan is to follow goals of PT POC and encourage greater endurance, proximal strength and control of gait as a result.      Recommendations for follow up therapy are one component of a multi-disciplinary discharge planning process, led by the attending physician.  Recommendations may be updated based on patient status, additional functional criteria and insurance authorization.  Follow Up Recommendations Home health PT      Assistance Recommended at Discharge Frequent or constant Supervision/Assistance  Patient can return home with the following  A little help with walking and/or transfers;A little help with bathing/dressing/bathroom;Assistance with cooking/housework;Assist for transportation;Help with stairs or ramp for entrance    Equipment Recommendations Rolling walker (2 wheels)  Recommendations for Other Services       Functional Status Assessment Patient has had a recent decline in their functional status and demonstrates the ability to make significant  improvements in function in a reasonable and predictable amount of time.     Precautions / Restrictions Precautions Precautions: Fall Precaution Comments: recommend RW Restrictions Weight Bearing Restrictions: No      Mobility  Bed Mobility Overal bed mobility: Needs Assistance Bed Mobility: Supine to Sit, Sit to Supine     Supine to sit: Min assist Sit to supine: Min assist        Transfers Overall transfer level: Needs assistance Equipment used: Rolling walker (2 wheels) Transfers: Sit to/from Stand Sit to Stand: Min guard                Ambulation/Gait Ambulation/Gait assistance: Min guard Gait Distance (Feet): 60 Feet Assistive device: Rolling walker (2 wheels), 1 person hand held assist Gait Pattern/deviations: Step-through pattern, Decreased stride length, Wide base of support (very wide turns with cues for set up to sit) Gait velocity: reduced        Stairs            Wheelchair Mobility    Modified Rankin (Stroke Patients Only)       Balance Overall balance assessment: Needs assistance Sitting-balance support: Feet supported Sitting balance-Leahy Scale: Fair     Standing balance support: Bilateral upper extremity supported, During functional activity Standing balance-Leahy Scale: Poor Standing balance comment: requires support to remain standing                             Pertinent Vitals/Pain Pain Assessment Pain Assessment: No/denies pain    Home Living Family/patient expects to be discharged to:: Private residence Living Arrangements: Spouse/significant other Available Help at Discharge: Family Type  of Home: House Home Access: Stairs to enter   [employer] of Steps: 2 at garage without rail, 5-6 in the back with a [place]: One level Home Equipment: None Additional Comments: pt is contradicting information from OT eval    Prior Function Prior Level of Function : Independent/Modified  Independent             Mobility Comments: per pt independent       Hand Dominance        Extremity/Trunk Assessment   Upper Extremity Assessment Upper Extremity Assessment: Generalized weakness    Lower Extremity Assessment Lower Extremity Assessment: Generalized weakness    Cervical / Trunk Assessment Cervical / Trunk Assessment: Kyphotic  Communication   Communication: HOH (pt asks for repetition of information and misunderstands)  Cognition Arousal/Alertness: Lethargic Behavior During Therapy: Flat affect, Anxious Overall Cognitive Status: Impaired/Different from baseline Area of Impairment: Awareness, Safety/judgement, Following commands, Memory, Attention                   Current Attention Level: Selective Memory: Decreased short-term memory Following Commands: Follows one step commands with increased time Safety/Judgement: Decreased awareness of safety, Decreased awareness of deficits Awareness: Intellectual   General Comments: difficulty with her medical history information and transplant plan        General Comments General comments (skin integrity, edema, etc.): Pt is up to move with help from IV pole and therapy support, with slow return on demonstration for moving.    Exercises     Assessment/Plan    PT Assessment Patient needs continued PT services  PT Problem List Decreased strength;Decreased activity tolerance;Decreased balance;Decreased mobility;Decreased coordination;Decreased knowledge of use of DME;Decreased cognition;Decreased safety awareness       PT Treatment Interventions DME instruction;Gait training;Stair training;Functional mobility training;Therapeutic activities;Therapeutic exercise;Balance training;Neuromuscular re-education;Patient/family education    PT Goals (Current goals can be found in the Care Plan section)  Acute Rehab PT Goals Patient Stated Goal: to get home with husband PT Goal Formulation: With  patient Time For Goal Achievement: 03/09/22 Potential to Achieve Goals: Good    Frequency Min 3X/week     Co-evaluation               AM-PAC PT "6 Clicks" Mobility  Outcome Measure Help needed turning from your back to your side while in a flat bed without using bedrails?: A Little Help needed moving from lying on your back to sitting on the side of a flat bed without using bedrails?: A Little Help needed moving to and from a bed to a chair (including a wheelchair)?: A Little Help needed standing up from a chair using your arms (e.g., wheelchair or bedside chair)?: A Little Help needed to walk in hospital room?: A Little Help needed climbing 3-5 steps with a railing? : A Lot 6 Click Score: 17    End of Session Equipment Utilized During Treatment: Gait belt Activity Tolerance: Patient limited by fatigue;Patient limited by lethargy Patient left: in bed;with call bell/phone within reach;with bed alarm set Nurse Communication: Mobility status PT Visit Diagnosis: Unsteadiness on feet (R26.81);Muscle weakness (generalized) (M62.81);Difficulty in walking, not elsewhere classified (R26.2)    Time: 8921-1941 PT Time Calculation (min) (ACUTE ONLY): 28 min   Charges:   PT Evaluation $PT Eval Moderate Complexity: 1 Mod PT Treatments $Gait Training: 8-22 mins       Ramond Dial 02/23/2022, 3:57 PM  Mee Hives, PT PhD Acute Rehab Dept. Number: [place] [facility] [place]

## 2022-02-23 NOTE — Progress Notes (Signed)
PROGRESS NOTE   Julie Montgomery  ZOX:[id number] DOB: 1935-10-10 DOA: 02/21/2022 PCP: Cassandria Anger, MD  Brief Narrative:  76 white female mild dementia/prior small TIA basal cell CA bronchiectasis known A-fib CHADVASC >5, sessile polyp on colonoscopy 03/2019 tubular adenoma, EGD 2020 small hiatal hernia followed by Dr. Fuller Plan with H. pylori Recurrent C. difficile X6 (2016 2018 11/2021) last admission 10/10 through 02/12/2022-also UTI at the time?  Initially treated with Dificid-ultimately felt to be postinfectious colitis + not C. difficile-Rx Entocort Bentyl probiotics and hospitalization complicated by A-fib RVR--- she was still taking Entocort probiotics align and Florastor On 10/24 developed large episodes of incontinence of watery stool with not really eating and drinking had abdominal tenderness additionally  C. difficile quick screen positive C. difficile antigen negative toxin PCR positive CT abdomen pelvis progressive moderate dilatation proximal to mid colon mucosal hyperenhancement air-fluid levels  GI as well as ID saw the patient Developed persistent A-fib and was placed on Cardizem gtt.    Hospital-Problem based course  Recurrent C. difficile third episode this year, antigen/PCR positive toxin negative--failed Dificid Prolonged vancomycin taper as per ID  "Vancomycin 125 mg PO qid for 14 days; then Vancomycin 125 mg PO bid for 7 days; then Vancomycin 125 mg PO daily for 7 days; then Vancomycin 125 mg PO every other day for 28 days; and then Vancomycin 125 mg PO every 3 days for 28 days. Has only had 3 stools today but if more than 5 may need Flexi-Seal Watch closely for severe abdominal pain given risk of toxic megacolon-soft diet -Pain control oxycodone 5 every 4 as needed, morphine 2 mg every 2 as needed severe pain Patient planning for fecal transplant per GI -Saline 75 cc/H  A-fib RVR on admission now with sinus pauses about 3-1/2 to 4 seconds Was on Cardizem gtt.  on admit and turned off for unclear reasons Cut back Cardizem from 2 40-1 80 CD As not persistent we will just monitor for now but keep closely watched on telemetry Continue apixaban 2.5 twice daily  Hypokalemia on admission Replaced with oral and IV-magnesium is 1.8 Repeat labs a.m.  Severe malnutrition Hold on placing any type of parenteral feeding device at this time Expect nutrition will improve in the next several days  DVT prophylaxis: Apixaban 2.5 twice daily Code Status: Full Family Communication: None Disposition:  Status is: Inpatient Remains inpatient appropriate because:   Requires stability prior to being able to discharge patient     Consultants:  Infectious disease Gastroenterology  Procedures: None  Antimicrobials: Oral vancomycin   Subjective: Some moderate pain in abdomen 3 water stools today no nausea no vomiting Has not been out of bed   Objective: Vitals:   02/22/22 1915 02/22/22 2006 02/22/22 2355 02/23/22 0507  BP:  (!) 99/59 (!) 102/58 96/71  Pulse:  65 66 68  Resp:  '20 20 19  '$ Temp: 98.3 F (36.8 C) 98 F (36.7 C) (!) 97.3 F (36.3 C) 98.5 F (36.9 C)  TempSrc: Oral Oral Oral Oral  SpO2:  97% 99% 95%  Weight:      Height:        Intake/Output Summary (Last 24 hours) at 02/23/2022 0745 Last data filed at 02/23/2022 0300 Gross per 24 hour  Intake 686.27 ml  Output --  Net 686.27 ml   Filed Weights   02/21/22 1507  Weight: 41.3 kg    Examination:  EOMI NCAT no focal deficit no icterus no pallor.  [White female no  distress Chest clear no added Abdomen slightly distended no rebound but is slightly tender in upper quadrant and seems distended with gas on palpation No lower extremity edema ROM intact  Data Reviewed: personally reviewed   CBC    Component Value Date/Time   WBC 3.0 (L) 02/21/2022 1541   RBC 3.67 (L) 02/21/2022 1541   HGB 11.2 (L) 02/21/2022 1541   HCT 34.5 (L) 02/21/2022 1541   PLT 193 02/21/2022 1541    MCV 94.0 02/21/2022 1541   MCH 30.5 02/21/2022 1541   MCHC 32.5 02/21/2022 1541   RDW 14.9 02/21/2022 1541   LYMPHSABS 0.4 (L) 02/21/2022 1541   MONOABS 0.4 02/21/2022 1541   EOSABS 0.0 02/21/2022 1541   BASOSABS 0.0 02/21/2022 1541      Latest Ref Rng & Units 02/22/2022   10:26 AM 02/21/2022    3:41 PM 02/12/2022    4:09 AM  CMP  Glucose 70 - 99 mg/dL 85  102  104   BUN 8 - 23 mg/dL '18  22  8   '$ Creatinine 0.44 - 1.00 mg/dL 0.69  0.57  0.65   Sodium 135 - 145 mmol/L 130  129  137   Potassium 3.5 - 5.1 mmol/L 3.3  3.8  4.0   Chloride 98 - 111 mmol/L 97  96  101   CO2 22 - 32 mmol/L '21  22  27   '$ Calcium 8.9 - 10.3 mg/dL 8.9  9.1  9.5   Total Protein 6.5 - 8.1 g/dL  5.6    Total Bilirubin 0.3 - 1.2 mg/dL  0.9    Alkaline Phos 38 - 126 U/L  73    AST 15 - 41 U/L  25    ALT 0 - 44 U/L  20       Radiology Studies: CT ABDOMEN PELVIS W CONTRAST  Result Date: 02/22/2022 CLINICAL DATA:  Diffuse abdominal pain with diarrhea for several days. EXAM: CT ABDOMEN AND PELVIS WITH CONTRAST TECHNIQUE: Multidetector CT imaging of the abdomen and pelvis was performed using the standard protocol following bolus administration of intravenous contrast. RADIATION DOSE REDUCTION: This exam was performed according to the departmental dose-optimization program which includes automated exposure control, adjustment of the mA and/or kV according to patient size and/or use of iterative reconstruction technique. CONTRAST:  16m OMNIPAQUE IOHEXOL 350 MG/ML SOLN COMPARISON:  Abdominopelvic CT 01/11/2022 and 12/24/2021. FINDINGS: Lower chest: Similar chronic central airway thickening and mucous plugging at both lung bases. No airspace disease or suspicious nodularity. No pleural or pericardial effusion. There is a small hiatal hernia. Hepatobiliary: The liver is normal in density without suspicious focal abnormality. Stable mild intra and extrahepatic biliary dilatation status post cholecystectomy, within  physiologic limits. Pancreas: Unremarkable. No pancreatic ductal dilatation or surrounding inflammatory changes. Spleen: Normal in size without focal abnormality. Adrenals/Urinary Tract: Both adrenal glands appear normal. No evidence of urinary tract calculus, hydronephrosis or perinephric soft tissue stranding. A simple cyst in the interpolar region of the left kidney is stable, for which no follow-up imaging recommended. Anteriorly in the interpolar region of the right kidney, there is a possible new small area of cortical scarring or inflammation without associated perinephric fluid collection. The bladder is moderately distended without focal abnormality. Stomach/Bowel: No enteric contrast administered. The stomach is decompressed. No small bowel distension, wall thickening or surrounding inflammation. Progressive moderate dilatation of the proximal to mid colon with mild wall thickening, mucosal hyperenhancement and scattered air-fluid levels. The cecum is located in the central upper  abdomen. The distal colon is decompressed with persistent wall thickening and surrounding inflammation, remaining suspicious for diffuse colitis. No evidence of pneumatosis or perforation. Vascular/Lymphatic: There are no enlarged abdominal or pelvic lymph nodes. Aortic and branch vessel atherosclerosis without acute vascular findings. Unchanged chronically thrombosed splenic artery aneurysm measuring 1.5 cm. Reproductive: Hysterectomy.  No adnexal mass is identified. Other: No ascites, pneumoperitoneum or focal extraluminal fluid collections identified. Musculoskeletal: No acute or significant osseous findings. Lower lumbar facet arthropathy. IMPRESSION: 1. Persistent findings of diffuse colitis in this patient with a history of C difficile colitis. Progressive moderate dilatation of the proximal to mid colon without evidence of obstruction or perforation. 2. Possible new small area of cortical scarring or inflammation in the  interpolar region of the right kidney. No evidence of urinary tract calculus or hydronephrosis. 3. Stable mild intra and extrahepatic biliary dilatation status post cholecystectomy, within physiologic limits. 4. Stable chronic central airway thickening and mucous plugging at both lung bases. 5. Stable chronically thrombosed splenic artery aneurysm. 6.  Aortic Atherosclerosis (ICD10-I70.0). Electronically Signed   By: Richardean Sale M.D.   On: 02/22/2022 11:46     Scheduled Meds:  acidophilus  1 capsule Oral Daily   apixaban  2.5 mg Oral BID   feeding supplement  1 Container Oral TID BM   saccharomyces boulardii  250 mg Oral BID   sodium chloride flush  3 mL Intravenous Q12H   vancomycin  125 mg Oral QID   Followed by   Derrill Memo ON 03/08/2022] vancomycin  125 mg Oral BID   Followed by   Derrill Memo ON 03/16/2022] vancomycin  125 mg Oral Daily   Followed by   Derrill Memo ON 03/23/2022] vancomycin  125 mg Oral QODAY   Followed by   Derrill Memo ON 04/20/2022] vancomycin  125 mg Oral Q3 days   Continuous Infusions:  diltiazem (CARDIZEM) infusion 10 mg/hr (02/22/22 1752)   lactated ringers 75 mL/hr at 02/23/22 0628     LOS: 1 day   Time spent: [place], MD [facility] To contact the attending provider between 7A-7P or the covering provider during after hours 7P-7A, please log into the web site [email or web address] and access using universal [place] password for that web site. If you do not have the password, please call the hospital operator.  02/23/2022, 7:45 AM

## 2022-02-23 NOTE — Progress Notes (Addendum)
Initial Nutrition Assessment  DOCUMENTATION CODES:   Severe malnutrition in context of chronic illness  INTERVENTION:  - Add Boost Breeze po TID, each supplement provides 250 kcal and 9 grams of protein  - Add Ensure Enlive po BID, each supplement provides 350 kcal and 20 grams of protein.  - Add MVI q day.   NUTRITION DIAGNOSIS:   Severe Malnutrition related to chronic illness as evidenced by severe muscle depletion, severe fat depletion, energy intake < 75% for > or equal to 3 months.  GOAL:   Patient will meet greater than or equal to 90% of their needs  MONITOR:   PO intake, Supplement acceptance  REASON FOR ASSESSMENT:   Consult Assessment of nutrition requirement/status  ASSESSMENT:   86 y.o. female admits related to diarrhea. PMH includes: polyp of colon, basal cell carcinoma, pneumonia, stroke. Pt is currently receiving medical management for recurrent C Diff colitis.  Meds include: Florastor, risaquad. Labs reviewed: Na low, K low.   The pt was attempting to eat her breakfast tray at time of assessment. The pt currently has Boost Breeze ordered TID. The pt reports that she likes the supplement and will try to drink them. Pt reports that she has not been eating well for 3 months PTA. Wts appear to be stable per record. Unsure if pt will be able to meet her needs with PO intakes alone. RD messaged the MD about possible Cortrak placement today due to severity of malnutrition.   RD will also add Boost plus BID for extra calories and protein.   NUTRITION - FOCUSED PHYSICAL EXAM:  Flowsheet Row Most Recent Value  Orbital Region Severe depletion  Upper Arm Region Severe depletion  Thoracic and Lumbar Region Severe depletion  Buccal Region Severe depletion  Temple Region Severe depletion  Clavicle Bone Region Severe depletion  Clavicle and Acromion Bone Region Severe depletion  Scapular Bone Region Severe depletion  Dorsal Hand Severe depletion  Patellar Region  Moderate depletion  Anterior Thigh Region Moderate depletion  Posterior Calf Region Severe depletion  Edema (RD Assessment) None  Hair Reviewed  Eyes Reviewed  Mouth Reviewed  Skin Reviewed  Nails Reviewed       Diet Order:   Diet Order             DIET SOFT Room service appropriate? Yes; Fluid consistency: Thin  Diet effective now                   EDUCATION NEEDS:   Not appropriate for education at this time  Skin:  Skin Assessment: Reviewed RN Assessment  Last BM:  02/23/22  Height:   Ht Readings from Last 1 Encounters:  02/21/22 '5\' 2"'$  (1.575 m)    Weight:   Wt Readings from Last 1 Encounters:  02/21/22 41.3 kg    Ideal Body Weight:  50 kg  BMI:  Body mass index is 16.64 kg/m.  Estimated Nutritional Needs:   Kcal:  1240-1445 kcals  Protein:  60-70 gm  Fluid:  >/= 1.2 L  Thalia Bloodgood, RD, LDN, CNSC.

## 2022-02-23 NOTE — Progress Notes (Signed)
[facility] for Infectious Disease  Date of Admission:  02/21/2022     Total days of antibiotics 2         ASSESSMENT:  Ms. Bhullar has been receiving oral vancomycin with no adverse side effects. Has remained clinically stable. Discussed plan of care to include vancomycin taper (see previous note from 10/26) until seen by [place].  Will schedule follow up in ID clinic. A. Fib appears better controlled today. Remaining medical and supportive care per primary team.   PLAN:  Continue vancomycin taper.  Follow up in ID clinic and with [facility] FMT programs. Remaining medical and supportive care per primary team.   ID will sign off. Please re-consult if needed.   Principal Problem:   Recurrent colitis due to Clostridioides difficile Active Problems:   Paroxysmal atrial fibrillation with RVR (HCC)    acidophilus  1 capsule Oral Daily   apixaban  2.5 mg Oral BID   [START ON 02/24/2022] diltiazem  30 mg Oral Q8H   feeding supplement  1 Container Oral TID BM   feeding supplement  237 mL Oral BID BM   multivitamin with minerals  1 tablet Oral Daily   saccharomyces boulardii  250 mg Oral BID   sodium chloride flush  3 mL Intravenous Q12H   vancomycin  125 mg Oral QID   Followed by   Derrill Memo ON 03/08/2022] vancomycin  125 mg Oral BID   Followed by   Derrill Memo ON 03/16/2022] vancomycin  125 mg Oral Daily   Followed by   Derrill Memo ON 03/23/2022] vancomycin  125 mg Oral QODAY   Followed by   Derrill Memo ON 04/20/2022] vancomycin  125 mg Oral Q3 days    SUBJECTIVE:  Afebrile overnight with no acute events. Continues to have diarrhea with abdominal pain and bloating feeling. Has been to the bathroom at least 3 times so far today.   Allergies  Allergen Reactions   Lovastatin Other (See Comments)    Unknown reaction   Mometasone Furo-Formoterol Fum Other (See Comments)    Loss of appetite, laryngitis    Prozac [Fluoxetine Hcl] Other (See Comments)    Jumpy   Sulfa  Antibiotics Other (See Comments)    Unknown reaction     Review of Systems: Review of Systems  Constitutional:  Negative for chills, fever and weight loss.  Respiratory:  Negative for cough, shortness of breath and wheezing.   Cardiovascular:  Negative for chest pain and leg swelling.  Gastrointestinal:  Positive for abdominal pain. Negative for constipation, diarrhea, nausea and vomiting.       Bloating  Skin:  Negative for rash.      OBJECTIVE: Vitals:   02/22/22 2355 02/23/22 0507 02/23/22 0834 02/23/22 1300  BP: (!) 102/58 96/71 (!) 122/52 112/61  Pulse: 66 68 75 71  Resp: '20 19 20 20  '$ Temp: (!) 97.3 F (36.3 C) 98.5 F (36.9 C) 98.2 F (36.8 C) 97.7 F (36.5 C)  TempSrc: Oral Oral Oral Oral  SpO2: 99% 95% 97% 97%  Weight:      Height:       Body mass index is 16.64 kg/m.  Physical Exam Constitutional:      General: She is not in acute distress.    Appearance: She is well-developed.  Cardiovascular:     Rate and Rhythm: Normal rate and regular rhythm.     Heart sounds: Normal heart sounds.  Pulmonary:     Effort: Pulmonary effort is normal.  Breath sounds: Normal breath sounds.  Skin:    General: Skin is warm and dry.  Neurological:     Mental Status: She is alert and oriented to person, place, and time.     Lab Results Lab Results  Component Value Date   WBC 5.4 02/23/2022   HGB 11.7 (L) 02/23/2022   HCT 35.1 (L) 02/23/2022   MCV 91.9 02/23/2022   PLT 249 02/23/2022    Lab Results  Component Value Date   CREATININE 0.78 02/23/2022   BUN 17 02/23/2022   NA 132 (L) 02/23/2022   K 4.3 02/23/2022   CL 103 02/23/2022   CO2 20 (L) 02/23/2022    Lab Results  Component Value Date   ALT 20 02/23/2022   AST 23 02/23/2022   ALKPHOS 78 02/23/2022   BILITOT 1.0 02/23/2022     Microbiology: Recent Results (from the past 240 hour(s))  C Difficile Quick Screen w PCR reflex     Status: Abnormal   Collection Time: 02/22/22  8:38 AM   Specimen:  STOOL  Result Value Ref Range Status   C Diff antigen POSITIVE (A) NEGATIVE Final   C Diff toxin NEGATIVE NEGATIVE Final   C Diff interpretation Results are indeterminate. See PCR results.  Final    Comment: Performed at [facility], [place] [address], [place], [place] [address]  C. Diff by PCR, Reflexed     Status: Abnormal   Collection Time: 02/22/22  8:38 AM  Result Value Ref Range Status   Toxigenic C. Difficile by PCR POSITIVE (A) NEGATIVE Final    Comment: Positive for toxigenic C. difficile with little to no toxin production. Only treat if clinical presentation suggests symptomatic illness. Performed at [facility], [place] [address], [place], [place] [address]      Terri Piedra, [place] for Infectious Disease [facility]  02/23/2022  2:46 PM

## 2022-02-23 NOTE — Plan of Care (Signed)
POC initiated and progressing. 

## 2022-02-23 NOTE — Progress Notes (Signed)
Patient ID: Julie Montgomery, female   DOB: [birth year], 86 y.o.   MRN: [id number]    Progress Note   Subjective   Day # 2  CC; recurrent C. difficile colitis  Vancomycin  initiated yesterday  Labs-WBC 5.4/hemoglobin 11.7/hematocrit 35.1 Sodium 132/potassium 4.3/BUN 17/creatinine 0.78 alb in 2.6  Patient looks better today, sitting up trying to eat Pulse now in the 70s She says she feels about the same, has had 3 diarrheal stools this morning, complains of abdominal tenderness and bloating     Objective   Vital signs in last 24 hours: Temp:  [97.3 F (36.3 C)-98.5 F (36.9 C)] 98.2 F (36.8 C) (10/27 0834) Pulse Rate:  [65-155] 75 (10/27 0834) Resp:  [16-35] 20 (10/27 0834) BP: (94-132)/(52-75) 122/52 (10/27 0834) SpO2:  [93 %-99 %] 97 % (10/27 0834) Last BM Date : 02/22/22 General: Elderly   white female in NAD Heart:  irRegular rate and rhythm; no murmurs Lungs: Respirations even and unlabored, lungs CTA bilaterally Abdomen:  Soft, somewhat distended, mildly tympanitic, bowel sounds are present, tender across the lower abdomen no rebound, normal bowel sounds. Extremities:  Without edema. Neurologic:  Alert and oriented,  grossly normal neurologically. Psych:  Cooperative. Normal mood and affect.  Intake/Output from previous day: 10/26 0701 - 10/27 0700 In: 686.3 [I.V.:686.3] Out: -  Intake/Output this shift: Total I/O In: 240 [P.O.:240] Out: -   Lab Results: Recent Labs    02/21/22 1541 02/23/22 0834  WBC 3.0* 5.4  HGB 11.2* 11.7*  HCT 34.5* 35.1*  PLT 193 249   BMET Recent Labs    02/21/22 1541 02/22/22 1026 02/23/22 0834  NA 129* 130* 132*  K 3.8 3.3* 4.3  CL 96* 97* 103  CO2 22 21* 20*  GLUCOSE 102* 85 102*  BUN '22 18 17  '$ CREATININE 0.57 0.69 0.78  CALCIUM 9.1 8.9 9.0   LFT Recent Labs    02/23/22 0834  PROT 5.5*  ALBUMIN 2.6*  AST 23  ALT 20  ALKPHOS 78  BILITOT 1.0   PT/INR No results for input(s): "LABPROT", "INR" in the last  72 hours.  Studies/Results: CT ABDOMEN PELVIS W CONTRAST  Result Date: 02/22/2022 CLINICAL DATA:  Diffuse abdominal pain with diarrhea for several days. EXAM: CT ABDOMEN AND PELVIS WITH CONTRAST TECHNIQUE: Multidetector CT imaging of the abdomen and pelvis was performed using the standard protocol following bolus administration of intravenous contrast. RADIATION DOSE REDUCTION: This exam was performed according to the departmental dose-optimization program which includes automated exposure control, adjustment of the mA and/or kV according to patient size and/or use of iterative reconstruction technique. CONTRAST:  77m OMNIPAQUE IOHEXOL 350 MG/ML SOLN COMPARISON:  Abdominopelvic CT 01/11/2022 and 12/24/2021. FINDINGS: Lower chest: Similar chronic central airway thickening and mucous plugging at both lung bases. No airspace disease or suspicious nodularity. No pleural or pericardial effusion. There is a small hiatal hernia. Hepatobiliary: The liver is normal in density without suspicious focal abnormality. Stable mild intra and extrahepatic biliary dilatation status post cholecystectomy, within physiologic limits. Pancreas: Unremarkable. No pancreatic ductal dilatation or surrounding inflammatory changes. Spleen: Normal in size without focal abnormality. Adrenals/Urinary Tract: Both adrenal glands appear normal. No evidence of urinary tract calculus, hydronephrosis or perinephric soft tissue stranding. A simple cyst in the interpolar region of the left kidney is stable, for which no follow-up imaging recommended. Anteriorly in the interpolar region of the right kidney, there is a possible new small area of cortical scarring or inflammation without associated perinephric fluid  collection. The bladder is moderately distended without focal abnormality. Stomach/Bowel: No enteric contrast administered. The stomach is decompressed. No small bowel distension, wall thickening or surrounding inflammation. Progressive  moderate dilatation of the proximal to mid colon with mild wall thickening, mucosal hyperenhancement and scattered air-fluid levels. The cecum is located in the central upper abdomen. The distal colon is decompressed with persistent wall thickening and surrounding inflammation, remaining suspicious for diffuse colitis. No evidence of pneumatosis or perforation. Vascular/Lymphatic: There are no enlarged abdominal or pelvic lymph nodes. Aortic and branch vessel atherosclerosis without acute vascular findings. Unchanged chronically thrombosed splenic artery aneurysm measuring 1.5 cm. Reproductive: Hysterectomy.  No adnexal mass is identified. Other: No ascites, pneumoperitoneum or focal extraluminal fluid collections identified. Musculoskeletal: No acute or significant osseous findings. Lower lumbar facet arthropathy. IMPRESSION: 1. Persistent findings of diffuse colitis in this patient with a history of C difficile colitis. Progressive moderate dilatation of the proximal to mid colon without evidence of obstruction or perforation. 2. Possible new small area of cortical scarring or inflammation in the interpolar region of the right kidney. No evidence of urinary tract calculus or hydronephrosis. 3. Stable mild intra and extrahepatic biliary dilatation status post cholecystectomy, within physiologic limits. 4. Stable chronic central airway thickening and mucous plugging at both lung bases. 5. Stable chronically thrombosed splenic artery aneurysm. 6.  Aortic Atherosclerosis (ICD10-I70.0). Electronically Signed   By: Richardean Sale M.D.   On: 02/22/2022 11:46       Assessment / Plan:    #98 86 year old white female with multiple recurrent episodes of C. difficile colitis.  She had admission in August and again in September with relapse versus recurrent C. difficile, both episodes treated with 10-day course of Dificid. Readmitted about 2 weeks ago with recurrent diarrhea however without admission C. difficile  antigen/toxin and PCR all negative, did have rather diffuse inflammatory changes of the colon on imaging, and had been started on a course of Entocort  Now with recurrence of diarrhea over the past 4 to 5 days progressive, and episode of incontinence the day prior to admission, she has had poor oral intake, progressive weakness, nausea  Stool studies again positive for C. difficile PCR/antigen and negative toxin  TEE showing mild dilation of the proximal to mid colon, persistent wall thickening particularly in the distal colon consistent with a diffuse colitis, no pneumatosis  Has been afebrile and no leukocytosis  ID on board Started vancomycin yesterday  #2 history of atrial fibrillation-A-fib with RVR yesterday,-pulse normalized #3 prior TIA #4.  Bronchiectasis #5.  History of adenomatous colon polyps and diverticulosis up-to-date with colonoscopy last done 2020 #6 mild dementia #7 status post cholecystectomy #8  hypokalemia corrected #9 significant debilitation secondary to prolonged illness  Plan; continue soft diet with supplements between meals Plan is for 14-day course of vancomycin 125 4 times daily, then slow taper over 6 to 8 weeks at least Entocort stopped Start dicyclomine as needed for cramping Will work towards referral to [facility]/Dr. Loraine Maple for fecal transplant     Principal Problem:   Recurrent colitis due to Clostridioides difficile Active Problems:   Paroxysmal atrial fibrillation with RVR ([place])     LOS: 1 day   Svetlana Bagby PA-C 02/23/2022, 11:58 AM

## 2022-02-24 ENCOUNTER — Inpatient Hospital Stay (HOSPITAL_COMMUNITY): Payer: Medicare Other

## 2022-02-24 DIAGNOSIS — A0471 Enterocolitis due to Clostridium difficile, recurrent: Secondary | ICD-10-CM | POA: Diagnosis not present

## 2022-02-24 LAB — CBC WITH DIFFERENTIAL/PLATELET
Abs Immature Granulocytes: 0.2 10*3/uL — ABNORMAL HIGH (ref 0.00–0.07)
Basophils Absolute: 0.1 10*3/uL (ref 0.0–0.1)
Basophils Relative: 1 %
Eosinophils Absolute: 0.1 10*3/uL (ref 0.0–0.5)
Eosinophils Relative: 1 %
HCT: 32.4 % — ABNORMAL LOW (ref 36.0–46.0)
Hemoglobin: 11.1 g/dL — ABNORMAL LOW (ref 12.0–15.0)
Lymphocytes Relative: 11 %
Lymphs Abs: 0.8 10*3/uL (ref 0.7–4.0)
MCH: 30.8 pg (ref 26.0–34.0)
MCHC: 34.3 g/dL (ref 30.0–36.0)
MCV: 90 fL (ref 80.0–100.0)
Metamyelocytes Relative: 3 %
Monocytes Absolute: 1.8 10*3/uL — ABNORMAL HIGH (ref 0.1–1.0)
Monocytes Relative: 25 %
Neutro Abs: 4.2 10*3/uL (ref 1.7–7.7)
Neutrophils Relative %: 59 %
Platelets: 241 10*3/uL (ref 150–400)
RBC: 3.6 MIL/uL — ABNORMAL LOW (ref 3.87–5.11)
RDW: 15.1 % (ref 11.5–15.5)
WBC: 7.1 10*3/uL (ref 4.0–10.5)
nRBC: 0 % (ref 0.0–0.2)
nRBC: 0 /100 WBC

## 2022-02-24 LAB — BASIC METABOLIC PANEL
Anion gap: 9 (ref 5–15)
BUN: 15 mg/dL (ref 8–23)
CO2: 20 mmol/L — ABNORMAL LOW (ref 22–32)
Calcium: 9 mg/dL (ref 8.9–10.3)
Chloride: 100 mmol/L (ref 98–111)
Creatinine, Ser: 0.61 mg/dL (ref 0.44–1.00)
GFR, Estimated: >60 mL/min (ref >60)
Glucose, Bld: 147 mg/dL — ABNORMAL HIGH (ref 70–99)
Potassium: 4.1 mmol/L (ref 3.5–5.1)
Sodium: 129 mmol/L — ABNORMAL LOW (ref 135–145)

## 2022-02-24 MED ORDER — IOHEXOL 350 MG/ML SOLN
75.0000 mL | Freq: Once | INTRAVENOUS | Status: AC | PRN
  Administered 2022-02-24: 75 mL via INTRAVENOUS

## 2022-02-24 NOTE — Progress Notes (Addendum)
Patient Name: Julie Montgomery Date of Encounter: 02/24/2022, 3:53 PM    Subjective  Denies increasing abd pain but had AXR and CT overnight because of c/o and has slightly more dilated colon  Still w/ diarrhea x 2 so far today  Day # 2-3 vancomycin (begun 10/26)   Objective  BP (!) 109/55 (BP Location: Left Arm)   Pulse 62   Temp (!) 97.5 F (36.4 C) (Oral)   Resp 19   Ht '5\' 2"'$  (1.575 m)   Wt 41.3 kg   SpO2 98%   BMI 16.64 kg/m  Thin elderly ww NAD Abd protuberant, soft NT w/ + BS - not high pitched or increased     Latest Ref Rng & Units 02/24/2022    1:08 AM 02/23/2022    8:34 AM 02/21/2022    3:41 PM  CBC  WBC 4.0 - 10.5 K/uL 7.1  5.4  3.0   Hemoglobin 12.0 - 15.0 g/dL 11.1  11.7  11.2   Hematocrit 36.0 - 46.0 % 32.4  35.1  34.5   Platelets 150 - 400 K/uL 241  249  193    CT ABDOMEN PELVIS W CONTRAST CLINICAL DATA:  Abdominal pain, acute, nonlocalized, C. difficile colitis with worsening abdominal pain.  EXAM: CT ABDOMEN AND PELVIS WITH CONTRAST  TECHNIQUE: Multidetector CT imaging of the abdomen and pelvis was performed using the standard protocol following bolus administration of intravenous contrast.  RADIATION DOSE REDUCTION: This exam was performed according to the departmental dose-optimization program which includes automated exposure control, adjustment of the mA and/or kV according to patient size and/or use of iterative reconstruction technique.  CONTRAST:  31m OMNIPAQUE IOHEXOL 350 MG/ML SOLN  COMPARISON:  02/22/2022.  FINDINGS: Lower chest: Airway wall thickening, peripheral mucous plugging, mild dependent atelectasis, similar to the prior study.  Hepatobiliary: No focal liver abnormality is seen. Status post cholecystectomy. No biliary dilatation.  Pancreas: Unremarkable. No pancreatic ductal dilatation or surrounding inflammatory changes.  Spleen: Normal in size without focal abnormality.  Adrenals/Urinary Tract: No  adrenal mass. Kidneys normal in size, orientation and position. 1.3 cm stable left midpole renal cyst. No follow-up recommended. No other masses, no stones and no hydronephrosis. Ureters normal in course and in caliber. Bladder is unremarkable.  Stomach/Bowel: Dilated colon, predominantly the ascending and transverse, maximum 7.8 cm, increased compared to the most recent prior CT. There are colonic air-fluid levels. Distal colon remains decompressed. Wall is suboptimally assessed due to the lack of distension and contrast. Mild wall thickening suggested along the mid sigmoid similar to the prior study. No other areas of colonic wall thickening.  Unremarkable stomach.  Small bowel is decompressed.  Vascular/Lymphatic: Peripherally calcified splenic artery aneurysm, 1.5 cm, stable. Aortic atherosclerosis. No aortic aneurysm. No enlarged lymph nodes.  Reproductive: Status post hysterectomy. No adnexal masses.  Other: None.  Musculoskeletal: No fracture or acute finding.  No bone lesion.  IMPRESSION: 1. Colonic dilation, with associated air-fluid levels. Degree of colon dilation is increased from the previous CT. Mild wall thickening is again suggested along the distal colon similar to the prior study consistent with persistent C diff colitis. No new areas of bowel inflammation. 2. No other change from the prior exam.  No other acute abnormality. 3. Stable thrombosed splenic artery aneurysm. Aortic atherosclerosis.  Electronically Signed   By: DLajean ManesM.D.   On: 02/24/2022 09:32 DG Abd 1 View CLINICAL DATA:  Abdominal pain  EXAM: ABDOMEN - 1 VIEW  COMPARISON:  CT 02/22/2022  FINDINGS: Gaseous distension of the colon up to the distal descending colon is again noted is again identified. The cecum which is in the midline of the abdomen measures 10.7 cm in diameter compared with 7.9 cm previously. No dilated small bowel loops identified. No signs of pneumatosis or  pneumoperitoneum.  IMPRESSION: 1. Persistent, slightly progressive gaseous distension of the colon up to the distal descending colon. 2. No pneumoperitoneum or signs of pneumatosis.  Electronically Signed   By: Kerby Moors M.D.   On: 02/24/2022 06:19      Assessment and Plan  C diff colitis w/ colonic distention - sl worse  She seems stable clinically Will continue oral vancomycin and allow clears Dc oxycodone and dicyclomine  Following Will facilitate [facility] referral re: FMT (outpatient)    Gatha Mayer, MD, [facility] See Shea Evans on call - gastroenterology for best contact person 02/24/2022 3:53 PM

## 2022-02-24 NOTE — Progress Notes (Addendum)
Long-acting to 30 every 8 PROGRESS NOTE   Julie Montgomery  ZOX:[id number] DOB: [birth year] DOA: 02/21/2022 PCP: Cassandria Anger, MD  Brief Narrative:  39 white female mild dementia/prior small TIA basal cell CA bronchiectasis known A-fib CHADVASC >5, sessile polyp on colonoscopy 03/2019 tubular adenoma, EGD 2020 small hiatal hernia followed by Dr. Fuller Plan with H. pylori Recurrent C. difficile X6 (2016 2018 11/2021) last admission 10/10 through 02/12/2022-also UTI at the time?  Initially treated with Dificid-ultimately felt to be postinfectious colitis + not C. difficile-Rx Entocort Bentyl probiotics and hospitalization complicated by A-fib RVR--- she was still taking Entocort probiotics align and Florastor On 10/24 developed large episodes of incontinence of watery stool with not really eating and drinking had abdominal tenderness additionally  C. difficile quick screen positive C. difficile antigen negative toxin PCR positive CT abdomen pelvis progressive moderate dilatation proximal to mid colon mucosal hyperenhancement air-fluid levels  GI as well as ID saw the patient Developed persistent A-fib and was placed on Cardizem gtt.  10/28, pauses on monitors, developed severe abdominal pain repeat CT shows dilatation    Hospital-Problem based course  Recurrent C. difficile third episode this year, antigen/PCR positive toxin negative--failed Dificid Prolonged vancomycin taper as per ID  "Vancomycin 125 mg PO qid for 14 days; then Vancomycin 125 mg PO bid for 7 days; then Vancomycin 125 mg PO daily for 7 days; then Vancomycin 125 mg PO every other day for 28 days; and then Vancomycin 125 mg PO every 3 days for 28 days" We will ask GI an opinion with regards to vancomycin enemas she does have dilatation and for now I would keep her n.p.o. and only keep her on fluids--until her abdominal pain resolves -Saline 75 cc/H to continue for this time  -Pain control oxycodone 5 every 4 as needed,  morphine 2 mg every 2 as needed severe pain Patient planning for fecal transplant per GI  A-fib RVR on admission now with sinus pauses about 3-1/2 to 4 seconds Weaned Cardizem gtt. --Cut back Cardizem from 240 milligrams CD to 30 every 8 and we will adjust needed Continue apixaban 2.5 twice daily  Hypokalemia on admission now resolved Replaced with oral and IV-magnesium to recheck in a.m.  Severe malnutrition Hold on placing any type of parenteral feeding device at this time Expect nutrition will improve as patient improves  DVT prophylaxis: Apixaban 2.5 twice daily Code Status: Full Family Communication: None Disposition:  Status is: Inpatient Remains inpatient appropriate because:   Requires stability prior to being able to discharge patient     Consultants:  Infectious disease Gastroenterology  Procedures: None  Antimicrobials: Oral vancomycin   Subjective:  Awake coherent x1 little confused cannot remember if received pain meds First tells me that the pain is moderate but then when I press on it she has a severe Her husband is not available today I told her not to drink or eat for now   Objective: Vitals:   02/23/22 2327 02/24/22 0255 02/24/22 0512 02/24/22 0944  BP: (!) 104/53 (!) 107/56 (!) 122/58 (!) 100/53  Pulse: 65 60 62 65  Resp: 17   20  Temp: 98.5 F (36.9 C)   (!) 97.5 F (36.4 C)  TempSrc: Oral   Oral  SpO2: 96% 97% 97% 100%  Weight:      Height:        Intake/Output Summary (Last 24 hours) at 02/24/2022 0958 Last data filed at 02/23/2022 1836 Gross per 24 hour  Intake 1166.95  ml  Output --  Net 1166.95 ml    Filed Weights   02/21/22 1507  Weight: 41.3 kg    Examination:  Awake coherent no distress extraocular movements intact quite frail cachectic CTA B Throat clear-chest is clear posterolaterally Abdomen is distended slightly tender in lower quadrant ROM is intact moving 4 limbs equally without neurodeficit although she is  quite weak  Data Reviewed: personally reviewed   CBC    Component Value Date/Time   WBC 7.1 02/24/2022 0108   RBC 3.60 (L) 02/24/2022 0108   HGB 11.1 (L) 02/24/2022 0108   HCT 32.4 (L) 02/24/2022 0108   PLT 241 02/24/2022 0108   MCV 90.0 02/24/2022 0108   MCH 30.8 02/24/2022 0108   MCHC 34.3 02/24/2022 0108   RDW 15.1 02/24/2022 0108   LYMPHSABS 0.8 02/24/2022 0108   MONOABS 1.8 (H) 02/24/2022 0108   EOSABS 0.1 02/24/2022 0108   BASOSABS 0.1 02/24/2022 0108      Latest Ref Rng & Units 02/24/2022    1:08 AM 02/23/2022    8:34 AM 02/22/2022   10:26 AM  CMP  Glucose 70 - 99 mg/dL 147  102  85   BUN 8 - 23 mg/dL '15  17  18   '$ Creatinine 0.44 - 1.00 mg/dL 0.61  0.78  0.69   Sodium 135 - 145 mmol/L 129  132  130   Potassium 3.5 - 5.1 mmol/L 4.1  4.3  3.3   Chloride 98 - 111 mmol/L 100  103  97   CO2 22 - 32 mmol/L '20  20  21   '$ Calcium 8.9 - 10.3 mg/dL 9.0  9.0  8.9   Total Protein 6.5 - 8.1 g/dL  5.5    Total Bilirubin 0.3 - 1.2 mg/dL  1.0    Alkaline Phos 38 - 126 U/L  78    AST 15 - 41 U/L  23    ALT 0 - 44 U/L  20       Radiology Studies: CT ABDOMEN PELVIS W CONTRAST  Result Date: 02/24/2022 CLINICAL DATA:  Abdominal pain, acute, nonlocalized, C. difficile colitis with worsening abdominal pain. EXAM: CT ABDOMEN AND PELVIS WITH CONTRAST TECHNIQUE: Multidetector CT imaging of the abdomen and pelvis was performed using the standard protocol following bolus administration of intravenous contrast. RADIATION DOSE REDUCTION: This exam was performed according to the departmental dose-optimization program which includes automated exposure control, adjustment of the mA and/or kV according to patient size and/or use of iterative reconstruction technique. CONTRAST:  68m OMNIPAQUE IOHEXOL 350 MG/ML SOLN COMPARISON:  02/22/2022. FINDINGS: Lower chest: Airway wall thickening, peripheral mucous plugging, mild dependent atelectasis, similar to the prior study. Hepatobiliary: No focal liver  abnormality is seen. Status post cholecystectomy. No biliary dilatation. Pancreas: Unremarkable. No pancreatic ductal dilatation or surrounding inflammatory changes. Spleen: Normal in size without focal abnormality. Adrenals/Urinary Tract: No adrenal mass. Kidneys normal in size, orientation and position. 1.3 cm stable left midpole renal cyst. No follow-up recommended. No other masses, no stones and no hydronephrosis. Ureters normal in course and in caliber. Bladder is unremarkable. Stomach/Bowel: Dilated colon, predominantly the ascending and transverse, maximum 7.8 cm, increased compared to the most recent prior CT. There are colonic air-fluid levels. Distal colon remains decompressed. Wall is suboptimally assessed due to the lack of distension and contrast. Mild wall thickening suggested along the mid sigmoid similar to the prior study. No other areas of colonic wall thickening. Unremarkable stomach.  Small bowel is decompressed. Vascular/Lymphatic: Peripherally calcified  splenic artery aneurysm, 1.5 cm, stable. Aortic atherosclerosis. No aortic aneurysm. No enlarged lymph nodes. Reproductive: Status post hysterectomy. No adnexal masses. Other: None. Musculoskeletal: No fracture or acute finding.  No bone lesion. IMPRESSION: 1. Colonic dilation, with associated air-fluid levels. Degree of colon dilation is increased from the previous CT. Mild wall thickening is again suggested along the distal colon similar to the prior study consistent with persistent C diff colitis. No new areas of bowel inflammation. 2. No other change from the prior exam.  No other acute abnormality. 3. Stable thrombosed splenic artery aneurysm. Aortic atherosclerosis. Electronically Signed   By: Lajean Manes M.D.   On: 02/24/2022 09:32   DG Abd 1 View  Result Date: 02/24/2022 CLINICAL DATA:  Abdominal pain EXAM: ABDOMEN - 1 VIEW COMPARISON:  CT 02/22/2022 FINDINGS: Gaseous distension of the colon up to the distal descending colon is  again noted is again identified. The cecum which is in the midline of the abdomen measures 10.7 cm in diameter compared with 7.9 cm previously. No dilated small bowel loops identified. No signs of pneumatosis or pneumoperitoneum. IMPRESSION: 1. Persistent, slightly progressive gaseous distension of the colon up to the distal descending colon. 2. No pneumoperitoneum or signs of pneumatosis. Electronically Signed   By: Kerby Moors M.D.   On: 02/24/2022 06:19   CT ABDOMEN PELVIS W CONTRAST  Result Date: 02/22/2022 CLINICAL DATA:  Diffuse abdominal pain with diarrhea for several days. EXAM: CT ABDOMEN AND PELVIS WITH CONTRAST TECHNIQUE: Multidetector CT imaging of the abdomen and pelvis was performed using the standard protocol following bolus administration of intravenous contrast. RADIATION DOSE REDUCTION: This exam was performed according to the departmental dose-optimization program which includes automated exposure control, adjustment of the mA and/or kV according to patient size and/or use of iterative reconstruction technique. CONTRAST:  60m OMNIPAQUE IOHEXOL 350 MG/ML SOLN COMPARISON:  Abdominopelvic CT 01/11/2022 and 12/24/2021. FINDINGS: Lower chest: Similar chronic central airway thickening and mucous plugging at both lung bases. No airspace disease or suspicious nodularity. No pleural or pericardial effusion. There is a small hiatal hernia. Hepatobiliary: The liver is normal in density without suspicious focal abnormality. Stable mild intra and extrahepatic biliary dilatation status post cholecystectomy, within physiologic limits. Pancreas: Unremarkable. No pancreatic ductal dilatation or surrounding inflammatory changes. Spleen: Normal in size without focal abnormality. Adrenals/Urinary Tract: Both adrenal glands appear normal. No evidence of urinary tract calculus, hydronephrosis or perinephric soft tissue stranding. A simple cyst in the interpolar region of the left kidney is stable, for which no  follow-up imaging recommended. Anteriorly in the interpolar region of the right kidney, there is a possible new small area of cortical scarring or inflammation without associated perinephric fluid collection. The bladder is moderately distended without focal abnormality. Stomach/Bowel: No enteric contrast administered. The stomach is decompressed. No small bowel distension, wall thickening or surrounding inflammation. Progressive moderate dilatation of the proximal to mid colon with mild wall thickening, mucosal hyperenhancement and scattered air-fluid levels. The cecum is located in the central upper abdomen. The distal colon is decompressed with persistent wall thickening and surrounding inflammation, remaining suspicious for diffuse colitis. No evidence of pneumatosis or perforation. Vascular/Lymphatic: There are no enlarged abdominal or pelvic lymph nodes. Aortic and branch vessel atherosclerosis without acute vascular findings. Unchanged chronically thrombosed splenic artery aneurysm measuring 1.5 cm. Reproductive: Hysterectomy.  No adnexal mass is identified. Other: No ascites, pneumoperitoneum or focal extraluminal fluid collections identified. Musculoskeletal: No acute or significant osseous findings. Lower lumbar facet arthropathy. IMPRESSION: 1. Persistent  findings of diffuse colitis in this patient with a history of C difficile colitis. Progressive moderate dilatation of the proximal to mid colon without evidence of obstruction or perforation. 2. Possible new small area of cortical scarring or inflammation in the interpolar region of the right kidney. No evidence of urinary tract calculus or hydronephrosis. 3. Stable mild intra and extrahepatic biliary dilatation status post cholecystectomy, within physiologic limits. 4. Stable chronic central airway thickening and mucous plugging at both lung bases. 5. Stable chronically thrombosed splenic artery aneurysm. 6.  Aortic Atherosclerosis (ICD10-I70.0).  Electronically Signed   By: Richardean Sale M.D.   On: 02/22/2022 11:46     Scheduled Meds:  acidophilus  1 capsule Oral Daily   apixaban  2.5 mg Oral BID   diltiazem  30 mg Oral Q8H   feeding supplement  1 Container Oral TID BM   feeding supplement  237 mL Oral BID BM   multivitamin with minerals  1 tablet Oral Daily   saccharomyces boulardii  250 mg Oral BID   sodium chloride flush  3 mL Intravenous Q12H   vancomycin  125 mg Oral QID   Followed by   Derrill Memo ON 03/08/2022] vancomycin  125 mg Oral BID   Followed by   Derrill Memo ON 03/16/2022] vancomycin  125 mg Oral Daily   Followed by   Derrill Memo ON 03/23/2022] vancomycin  125 mg Oral QODAY   Followed by   Derrill Memo ON 04/20/2022] vancomycin  125 mg Oral Q3 days   Continuous Infusions:  lactated ringers 75 mL/hr at 02/23/22 2118     LOS: 2 days   Time spent: [place], MD [facility] To contact the attending provider between 7A-7P or the covering provider during after hours 7P-7A, please log into the web site [email or web address] and access using universal [place] password for that web site. If you do not have the password, please call the hospital operator.  02/24/2022, 9:58 AM

## 2022-02-24 NOTE — Progress Notes (Addendum)
Overnight progress note  Notified by RN that patient complaining of severe abdominal pain and her abdomen appears distended, tender to palpation, and with decreased bowel sounds.  No nausea or vomiting.  Vital signs stable.  No fever, tachycardia, or hypotension.    Labs done this morning showing no leukocytosis, hemoglobin stable.  -Stat KUB to assess for signs of possible toxic megacolon, currently pending  Addendum: KUB showing persistent, slightly progressive gaseous distention of the colon up to the distal descending colon.  No pneumoperitoneum or signs of pneumatosis.  Stat CT ordered for further evaluation.

## 2022-02-24 NOTE — Progress Notes (Addendum)
Pt moaning loudly, repeating that her stomach is about to explode or pop. Reassurance provided. PRN pain medication administered per order. Abdomen taut, distended, tender with palpation, decreased BS. Pt denies n/v just states it is getting tighter and tighter. Dr. Marlowe Sax on-call/paged @ 0300. Awaiting call back at this time.   2nd page to Dr. Marlowe Sax @ 0330. Call back received at [facility]. New orders for KUB.

## 2022-02-25 DIAGNOSIS — A0471 Enterocolitis due to Clostridium difficile, recurrent: Secondary | ICD-10-CM | POA: Diagnosis not present

## 2022-02-25 LAB — CBC WITH DIFFERENTIAL/PLATELET
Abs Immature Granulocytes: 0.1 10*3/uL — ABNORMAL HIGH (ref 0.00–0.07)
Basophils Absolute: 0 10*3/uL (ref 0.0–0.1)
Basophils Relative: 0 %
Eosinophils Absolute: 0 10*3/uL (ref 0.0–0.5)
Eosinophils Relative: 0 %
HCT: 34.1 % — ABNORMAL LOW (ref 36.0–46.0)
Hemoglobin: 11.7 g/dL — ABNORMAL LOW (ref 12.0–15.0)
Lymphocytes Relative: 6 %
Lymphs Abs: 0.7 10*3/uL (ref 0.7–4.0)
MCH: 30.9 pg (ref 26.0–34.0)
MCHC: 34.3 g/dL (ref 30.0–36.0)
MCV: 90 fL (ref 80.0–100.0)
Metamyelocytes Relative: 1 %
Monocytes Absolute: 0.9 10*3/uL (ref 0.1–1.0)
Monocytes Relative: 8 %
Neutro Abs: 9.6 10*3/uL — ABNORMAL HIGH (ref 1.7–7.7)
Neutrophils Relative %: 85 %
Platelets: 274 10*3/uL (ref 150–400)
RBC: 3.79 MIL/uL — ABNORMAL LOW (ref 3.87–5.11)
RDW: 15.2 % (ref 11.5–15.5)
WBC: 11.3 10*3/uL — ABNORMAL HIGH (ref 4.0–10.5)
nRBC: 0 % (ref 0.0–0.2)
nRBC: 0 /100 WBC

## 2022-02-25 LAB — BASIC METABOLIC PANEL
Anion gap: 11 (ref 5–15)
BUN: 15 mg/dL (ref 8–23)
CO2: 21 mmol/L — ABNORMAL LOW (ref 22–32)
Calcium: 9.8 mg/dL (ref 8.9–10.3)
Chloride: 98 mmol/L (ref 98–111)
Creatinine, Ser: 0.53 mg/dL (ref 0.44–1.00)
GFR, Estimated: >60 mL/min (ref >60)
Glucose, Bld: 132 mg/dL — ABNORMAL HIGH (ref 70–99)
Potassium: 4.3 mmol/L (ref 3.5–5.1)
Sodium: 130 mmol/L — ABNORMAL LOW (ref 135–145)

## 2022-02-25 LAB — MAGNESIUM: Magnesium: 1.8 mg/dL (ref 1.7–2.4)

## 2022-02-25 NOTE — Progress Notes (Signed)
Long-acting to 30 every 8 PROGRESS NOTE   Julie Montgomery  LNL:[id number] DOB: [birth year] DOA: 02/21/2022 PCP: Cassandria Anger, MD  Brief Narrative:  75 white female mild dementia/prior small TIA basal cell CA bronchiectasis known A-fib CHADVASC >5, sessile polyp on colonoscopy 03/2019 tubular adenoma, EGD 2020 small hiatal hernia followed by Dr. Fuller Plan with H. pylori Recurrent C. difficile X6 (2016 2018 11/2021) last admission 10/10 through 02/12/2022-also UTI at the time?  Initially treated with Dificid-ultimately felt to be postinfectious colitis + not C. difficile-Rx Entocort Bentyl probiotics and hospitalization complicated by A-fib RVR--- she was still taking Entocort probiotics align and Florastor On 10/24 developed large episodes of incontinence of watery stool with not really eating and drinking had abdominal tenderness additionally  C. difficile quick screen positive C. difficile antigen negative toxin PCR positive CT abdomen pelvis progressive moderate dilatation proximal to mid colon mucosal hyperenhancement air-fluid levels  GI as well as ID saw the patient Developed persistent A-fib and was placed on Cardizem gtt.  10/28, pauses on monitors, developed severe abdominal pain repeat CT shows dilatation    Hospital-Problem based course  Recurrent C. difficile third episode this year, antigen/PCR positive toxin negative--failed Dificid Prolonged vancomycin taper as per ID --WBC is somewhat variable "Vancomycin 125 mg PO qid for 14 days; then Vancomycin 125 mg PO bid for 7 days; then Vancomycin 125 mg PO daily for 7 days; then Vancomycin 125 mg PO every other day for 28 days; and then Vancomycin 125 mg PO every 3 days for 28 days" GI following and graduated her to soft diet-her answers are inconsistent with regards to pain she does not seem to be in distress I will challenge her with a soft diet 10/29 -Saline now 50cc/h  -Pain control oxycodone 5 every 4 as needed, morphine 2 mg  every 2 as needed severe pain Patient planning for fecal transplant per GI as outpatient  A-fib RVR on admission  Resolved Sinus pauses about 3-1/2 to 4 seconds Weaned Cardizem gtt. --Cut back Cardizem from 240 milligrams CD to 30 every 8 and is in sinus bradycardia but without pauses Continue apixaban 2.5 twice daily  Hypokalemia on admission now resolved Replaced with oral and IV-magnesium 1.8--hold all replacements  Severe malnutrition Hold on placing any type of parenteral feeding device at this time Expect nutrition will improve as patient improves  DVT prophylaxis: Apixaban 2.5 twice daily Code Status: Full Family Communication: None Disposition:  Status is: Inpatient Remains inpatient appropriate because:   Requires stability prior to being able to discharge patient     Consultants:  Infectious disease Gastroenterology  Procedures: None  Antimicrobials: Oral vancomycin   Subjective:  More coherent what husband at bedside states pain is "about the same" No fever chills Still feels distended-she is drinking shakes and supplements and I have asked her to try and eat more  Objective: Vitals:   02/24/22 1638 02/24/22 2011 02/25/22 0400 02/25/22 0740  BP: 93/71 100/62 121/62   Pulse: 69  66   Resp: '19 18 20   '$ Temp: 97.8 F (36.6 C) 98.1 F (36.7 C) 98.2 F (36.8 C) 97.6 F (36.4 C)  TempSrc: Oral Oral Oral Oral  SpO2: 100%  97%   Weight:      Height:       No intake or output data in the 24 hours ending 02/25/22 1019  Filed Weights   02/21/22 1507  Weight: 41.3 kg    Examination:  Frail white female cachectic Chest clear  no wheeze rales rhonchi S1-S2 no murmur Abdomen distended looks about the same as yesterday still somewhat tender on pressure No rebound no guarding No lower extremity edema Power is grossly 5/5 major muscle groups  Data Reviewed: personally reviewed   CBC    Component Value Date/Time   WBC 11.3 (H) 02/25/2022 0108    RBC 3.79 (L) 02/25/2022 0108   HGB 11.7 (L) 02/25/2022 0108   HCT 34.1 (L) 02/25/2022 0108   PLT 274 02/25/2022 0108   MCV 90.0 02/25/2022 0108   MCH 30.9 02/25/2022 0108   MCHC 34.3 02/25/2022 0108   RDW 15.2 02/25/2022 0108   LYMPHSABS 0.7 02/25/2022 0108   MONOABS 0.9 02/25/2022 0108   EOSABS 0.0 02/25/2022 0108   BASOSABS 0.0 02/25/2022 0108      Latest Ref Rng & Units 02/25/2022    1:08 AM 02/24/2022    1:08 AM 02/23/2022    8:34 AM  CMP  Glucose 70 - 99 mg/dL 132  147  102   BUN 8 - 23 mg/dL '15  15  17   '$ Creatinine 0.44 - 1.00 mg/dL 0.53  0.61  0.78   Sodium 135 - 145 mmol/L 130  129  132   Potassium 3.5 - 5.1 mmol/L 4.3  4.1  4.3   Chloride 98 - 111 mmol/L 98  100  103   CO2 22 - 32 mmol/L '21  20  20   '$ Calcium 8.9 - 10.3 mg/dL 9.8  9.0  9.0   Total Protein 6.5 - 8.1 g/dL   5.5   Total Bilirubin 0.3 - 1.2 mg/dL   1.0   Alkaline Phos 38 - 126 U/L   78   AST 15 - 41 U/L   23   ALT 0 - 44 U/L   20      Radiology Studies: CT ABDOMEN PELVIS W CONTRAST  Result Date: 02/24/2022 CLINICAL DATA:  Abdominal pain, acute, nonlocalized, C. difficile colitis with worsening abdominal pain. EXAM: CT ABDOMEN AND PELVIS WITH CONTRAST TECHNIQUE: Multidetector CT imaging of the abdomen and pelvis was performed using the standard protocol following bolus administration of intravenous contrast. RADIATION DOSE REDUCTION: This exam was performed according to the departmental dose-optimization program which includes automated exposure control, adjustment of the mA and/or kV according to patient size and/or use of iterative reconstruction technique. CONTRAST:  24m OMNIPAQUE IOHEXOL 350 MG/ML SOLN COMPARISON:  02/22/2022. FINDINGS: Lower chest: Airway wall thickening, peripheral mucous plugging, mild dependent atelectasis, similar to the prior study. Hepatobiliary: No focal liver abnormality is seen. Status post cholecystectomy. No biliary dilatation. Pancreas: Unremarkable. No pancreatic ductal  dilatation or surrounding inflammatory changes. Spleen: Normal in size without focal abnormality. Adrenals/Urinary Tract: No adrenal mass. Kidneys normal in size, orientation and position. 1.3 cm stable left midpole renal cyst. No follow-up recommended. No other masses, no stones and no hydronephrosis. Ureters normal in course and in caliber. Bladder is unremarkable. Stomach/Bowel: Dilated colon, predominantly the ascending and transverse, maximum 7.8 cm, increased compared to the most recent prior CT. There are colonic air-fluid levels. Distal colon remains decompressed. Wall is suboptimally assessed due to the lack of distension and contrast. Mild wall thickening suggested along the mid sigmoid similar to the prior study. No other areas of colonic wall thickening. Unremarkable stomach.  Small bowel is decompressed. Vascular/Lymphatic: Peripherally calcified splenic artery aneurysm, 1.5 cm, stable. Aortic atherosclerosis. No aortic aneurysm. No enlarged lymph nodes. Reproductive: Status post hysterectomy. No adnexal masses. Other: None. Musculoskeletal: No fracture or acute  finding.  No bone lesion. IMPRESSION: 1. Colonic dilation, with associated air-fluid levels. Degree of colon dilation is increased from the previous CT. Mild wall thickening is again suggested along the distal colon similar to the prior study consistent with persistent C diff colitis. No new areas of bowel inflammation. 2. No other change from the prior exam.  No other acute abnormality. 3. Stable thrombosed splenic artery aneurysm. Aortic atherosclerosis. Electronically Signed   By: Lajean Manes M.D.   On: 02/24/2022 09:32   DG Abd 1 View  Result Date: 02/24/2022 CLINICAL DATA:  Abdominal pain EXAM: ABDOMEN - 1 VIEW COMPARISON:  CT 02/22/2022 FINDINGS: Gaseous distension of the colon up to the distal descending colon is again noted is again identified. The cecum which is in the midline of the abdomen measures 10.7 cm in diameter compared  with 7.9 cm previously. No dilated small bowel loops identified. No signs of pneumatosis or pneumoperitoneum. IMPRESSION: 1. Persistent, slightly progressive gaseous distension of the colon up to the distal descending colon. 2. No pneumoperitoneum or signs of pneumatosis. Electronically Signed   By: Kerby Moors M.D.   On: 02/24/2022 06:19     Scheduled Meds:  acidophilus  1 capsule Oral Daily   apixaban  2.5 mg Oral BID   diltiazem  30 mg Oral Q8H   feeding supplement  1 Container Oral TID BM   feeding supplement  237 mL Oral BID BM   multivitamin with minerals  1 tablet Oral Daily   saccharomyces boulardii  250 mg Oral BID   sodium chloride flush  3 mL Intravenous Q12H   vancomycin  125 mg Oral QID   Followed by   Derrill Memo ON 03/08/2022] vancomycin  125 mg Oral BID   Followed by   Derrill Memo ON 03/16/2022] vancomycin  125 mg Oral Daily   Followed by   Derrill Memo ON 03/23/2022] vancomycin  125 mg Oral QODAY   Followed by   Derrill Memo ON 04/20/2022] vancomycin  125 mg Oral Q3 days   Continuous Infusions:  lactated ringers 75 mL/hr at 02/23/22 2118     LOS: 3 days   Time spent: 25  Nita Sells, MD [facility] To contact the attending provider between 7A-7P or the covering provider during after hours 7P-7A, please log into the web site [email or web address] and access using universal [place] password for that web site. If you do not have the password, please call the hospital operator.  02/25/2022, 10:19 AM

## 2022-02-25 NOTE — Progress Notes (Signed)
Patient ID: Julie Montgomery, female   DOB: Feb 05, 1936, 86 y.o.   MRN: [id number]    Progress Note   Subjective   Day # 4  CC; recurrent C. difficile colitis, abdominal pain  Oral vancomycin day 4 Repeat CT yesterday done for complaints of increased abdominal pain/distention-colonic dilation with associated air-fluid levels mild wall thickening again seen along the distal colon consistent with persistent C. difficile colitis no new areas of bowel inflammation no change otherwise from prior exam  WBC 11.3/hemoglobin 11.7/hematocrit 34.1 CRP 22 Sodium 130/BUN 15/creatinine 0.5  Patient looks a little better, brighter, says she would like to get out of bed she was able to eat some solid food today, still complaining of abdominal pain and a tight feeling in her lower abdomen which is about the same no better or worse, she has had 2-3 diarrheal stools today   Objective   Vital signs in last 24 hours: Temp:  [97.6 F (36.4 C)-98.2 F (36.8 C)] 98 F (36.7 C) (10/29 1259) Pulse Rate:  [66-69] 66 (10/29 1259) Resp:  [18-20] 20 (10/29 1259) BP: (93-134)/(61-71) 134/61 (10/29 1259) SpO2:  [92 %-100 %] 92 % (10/29 1259) Last BM Date : 02/23/22 General: Elderly white female in NAD-family at bedside Heart:  Regular rate and rhythm; no murmurs Lungs: Respirations even and unlabored, lungs CTA bilaterally Abdomen:  Soft, distended and somewhat tympanitic, bowel sounds are present , mild tenderness across the lower abdomen Extremities:  Without edema. Neurologic:  Alert and oriented,  grossly normal neurologically. Psych:  Cooperative. Normal mood and affect.  Intake/Output from previous day: No intake/output data recorded. Intake/Output this shift: No intake/output data recorded.  Lab Results: Recent Labs    02/23/22 0834 02/24/22 0108 02/25/22 0108  WBC 5.4 7.1 11.3*  HGB 11.7* 11.1* 11.7*  HCT 35.1* 32.4* 34.1*  PLT 249 241 274   BMET Recent Labs    02/23/22 0834  02/24/22 0108 02/25/22 0108  NA 132* 129* 130*  K 4.3 4.1 4.3  CL 103 100 98  CO2 20* 20* 21*  GLUCOSE 102* 147* 132*  BUN '17 15 15  '$ CREATININE 0.78 0.61 0.53  CALCIUM 9.0 9.0 9.8   LFT Recent Labs    02/23/22 0834  PROT 5.5*  ALBUMIN 2.6*  AST 23  ALT 20  ALKPHOS 78  BILITOT 1.0   PT/INR No results for input(s): "LABPROT", "INR" in the last 72 hours.  Studies/Results: CT ABDOMEN PELVIS W CONTRAST  Result Date: 02/24/2022 CLINICAL DATA:  Abdominal pain, acute, nonlocalized, C. difficile colitis with worsening abdominal pain. EXAM: CT ABDOMEN AND PELVIS WITH CONTRAST TECHNIQUE: Multidetector CT imaging of the abdomen and pelvis was performed using the standard protocol following bolus administration of intravenous contrast. RADIATION DOSE REDUCTION: This exam was performed according to the departmental dose-optimization program which includes automated exposure control, adjustment of the mA and/or kV according to patient size and/or use of iterative reconstruction technique. CONTRAST:  32m OMNIPAQUE IOHEXOL 350 MG/ML SOLN COMPARISON:  02/22/2022. FINDINGS: Lower chest: Airway wall thickening, peripheral mucous plugging, mild dependent atelectasis, similar to the prior study. Hepatobiliary: No focal liver abnormality is seen. Status post cholecystectomy. No biliary dilatation. Pancreas: Unremarkable. No pancreatic ductal dilatation or surrounding inflammatory changes. Spleen: Normal in size without focal abnormality. Adrenals/Urinary Tract: No adrenal mass. Kidneys normal in size, orientation and position. 1.3 cm stable left midpole renal cyst. No follow-up recommended. No other masses, no stones and no hydronephrosis. Ureters normal in course and in caliber. Bladder is  unremarkable. Stomach/Bowel: Dilated colon, predominantly the ascending and transverse, maximum 7.8 cm, increased compared to the most recent prior CT. There are colonic air-fluid levels. Distal colon remains  decompressed. Wall is suboptimally assessed due to the lack of distension and contrast. Mild wall thickening suggested along the mid sigmoid similar to the prior study. No other areas of colonic wall thickening. Unremarkable stomach.  Small bowel is decompressed. Vascular/Lymphatic: Peripherally calcified splenic artery aneurysm, 1.5 cm, stable. Aortic atherosclerosis. No aortic aneurysm. No enlarged lymph nodes. Reproductive: Status post hysterectomy. No adnexal masses. Other: None. Musculoskeletal: No fracture or acute finding.  No bone lesion. IMPRESSION: 1. Colonic dilation, with associated air-fluid levels. Degree of colon dilation is increased from the previous CT. Mild wall thickening is again suggested along the distal colon similar to the prior study consistent with persistent C diff colitis. No new areas of bowel inflammation. 2. No other change from the prior exam.  No other acute abnormality. 3. Stable thrombosed splenic artery aneurysm. Aortic atherosclerosis. Electronically Signed   By: Lajean Manes M.D.   On: 02/24/2022 09:32   DG Abd 1 View  Result Date: 02/24/2022 CLINICAL DATA:  Abdominal pain EXAM: ABDOMEN - 1 VIEW COMPARISON:  CT 02/22/2022 FINDINGS: Gaseous distension of the colon up to the distal descending colon is again noted is again identified. The cecum which is in the midline of the abdomen measures 10.7 cm in diameter compared with 7.9 cm previously. No dilated small bowel loops identified. No signs of pneumatosis or pneumoperitoneum. IMPRESSION: 1. Persistent, slightly progressive gaseous distension of the colon up to the distal descending colon. 2. No pneumoperitoneum or signs of pneumatosis. Electronically Signed   By: Kerby Moors M.D.   On: 02/24/2022 06:19       Assessment / Plan:    #14 86 year old white female with third episode of recurrent C. difficile colitis over the past few months , Stable on vancomycin though no dramatic improvement as yet  She does have  changes on CT consistent with mild colonic ileus and persistent inflammatory changes in the descending colon secondary to known C. difficile colitis  Continue vancomycin Stopped Bentyl yesterday, would only use analgesics for severe pain has been worsening ileus  #2 history of A-fib, A-fib with RVR on admit #3 debilitation secondary to prolonged illness #4 dementia  Plan continue planned 14-day course of vancomycin 125 mg 4 times daily then slow taper as has been outlined over 6 to 8 weeks Vancomycin can be continued at low-dose pulsed rate until we can get her in for fecal microbial transplant  We will leave off dicyclomine for now, If she develops increasing abdominal pain, change back to clear liquids GI will continue to follow  It is to get her referred either to [facility] or [facility] for fecal transplant as an outpatient in the very near future once she has completed the initial 2 weeks of vancomycin           Principal Problem:   Recurrent colitis due to Clostridioides difficile Active Problems:   Paroxysmal atrial fibrillation with RVR ([place])     LOS: 3 days   Emmalyne Giacomo PA-C 02/25/2022, 2:23 PM

## 2022-02-26 ENCOUNTER — Inpatient Hospital Stay (HOSPITAL_COMMUNITY): Payer: Medicare Other

## 2022-02-26 ENCOUNTER — Other Ambulatory Visit (HOSPITAL_COMMUNITY): Payer: Self-pay

## 2022-02-26 ENCOUNTER — Telehealth: Payer: Self-pay

## 2022-02-26 ENCOUNTER — Encounter (HOSPITAL_COMMUNITY): Payer: Self-pay | Admitting: Internal Medicine

## 2022-02-26 ENCOUNTER — Telehealth (HOSPITAL_COMMUNITY): Payer: Self-pay

## 2022-02-26 DIAGNOSIS — A0471 Enterocolitis due to Clostridium difficile, recurrent: Secondary | ICD-10-CM | POA: Diagnosis not present

## 2022-02-26 LAB — CBC WITH DIFFERENTIAL/PLATELET
Abs Immature Granulocytes: 0.14 10*3/uL — ABNORMAL HIGH (ref 0.00–0.07)
Basophils Absolute: 0 10*3/uL (ref 0.0–0.1)
Basophils Relative: 0 %
Eosinophils Absolute: 0 10*3/uL (ref 0.0–0.5)
Eosinophils Relative: 0 %
HCT: 35.3 % — ABNORMAL LOW (ref 36.0–46.0)
Hemoglobin: 11.9 g/dL — ABNORMAL LOW (ref 12.0–15.0)
Immature Granulocytes: 1 %
Lymphocytes Relative: 9 %
Lymphs Abs: 0.9 10*3/uL (ref 0.7–4.0)
MCH: 30.1 pg (ref 26.0–34.0)
MCHC: 33.7 g/dL (ref 30.0–36.0)
MCV: 89.1 fL (ref 80.0–100.0)
Monocytes Absolute: 1.2 10*3/uL — ABNORMAL HIGH (ref 0.1–1.0)
Monocytes Relative: 12 %
Neutro Abs: 8 10*3/uL — ABNORMAL HIGH (ref 1.7–7.7)
Neutrophils Relative %: 78 %
Platelets: 309 10*3/uL (ref 150–400)
RBC: 3.96 MIL/uL (ref 3.87–5.11)
RDW: 15.3 % (ref 11.5–15.5)
WBC: 10.3 10*3/uL (ref 4.0–10.5)
nRBC: 0 % (ref 0.0–0.2)

## 2022-02-26 LAB — COMPREHENSIVE METABOLIC PANEL
ALT: 19 U/L (ref 0–44)
AST: 15 U/L (ref 15–41)
Albumin: 2.2 g/dL — ABNORMAL LOW (ref 3.5–5.0)
Alkaline Phosphatase: 90 U/L (ref 38–126)
Anion gap: 7 (ref 5–15)
BUN: 19 mg/dL (ref 8–23)
CO2: 25 mmol/L (ref 22–32)
Calcium: 9.6 mg/dL (ref 8.9–10.3)
Chloride: 98 mmol/L (ref 98–111)
Creatinine, Ser: 0.59 mg/dL (ref 0.44–1.00)
GFR, Estimated: >60 mL/min (ref >60)
Glucose, Bld: 146 mg/dL — ABNORMAL HIGH (ref 70–99)
Potassium: 4.1 mmol/L (ref 3.5–5.1)
Sodium: 130 mmol/L — ABNORMAL LOW (ref 135–145)
Total Bilirubin: 0.4 mg/dL (ref 0.3–1.2)
Total Protein: 5.4 g/dL — ABNORMAL LOW (ref 6.5–8.1)

## 2022-02-26 MED ORDER — OXYCODONE HCL 5 MG PO TABS
5.0000 mg | ORAL_TABLET | ORAL | Status: DC | PRN
  Administered 2022-02-27 – 2022-02-28 (×2): 5 mg via ORAL
  Filled 2022-02-26 (×3): qty 1

## 2022-02-26 MED ORDER — DILTIAZEM HCL ER COATED BEADS 120 MG PO CP24
120.0000 mg | ORAL_CAPSULE | Freq: Every day | ORAL | Status: DC
  Administered 2022-02-26 – 2022-02-28 (×3): 120 mg via ORAL
  Filled 2022-02-26 (×3): qty 1

## 2022-02-26 NOTE — Progress Notes (Signed)
Long-acting to 30 every 8 PROGRESS NOTE   Julie Montgomery  XBL:[id number] DOB: 1935/05/19 DOA: 02/21/2022 PCP: Cassandria Anger, MD  Brief Narrative:  81 white female mild dementia/prior small TIA basal cell CA bronchiectasis known A-fib CHADVASC >5, sessile polyp on colonoscopy 03/2019 tubular adenoma, EGD 2020 small hiatal hernia followed by Dr. Fuller Plan with H. pylori Recurrent C. difficile X6 (2016 2018 11/2021) last admission 10/10 through 02/12/2022-also UTI at the time?  Initially treated with Dificid-ultimately felt to be postinfectious colitis + not C. difficile-Rx Entocort Bentyl probiotics and hospitalization complicated by A-fib RVR--- she was still taking Entocort probiotics align and Florastor On 10/24 developed large episodes of incontinence of watery stool with not really eating and drinking had abdominal tenderness additionally  C. difficile quick screen positive C. difficile antigen negative toxin PCR positive CT abdomen pelvis progressive moderate dilatation proximal to mid colon mucosal hyperenhancement air-fluid levels  GI as well as ID saw the patient Developed persistent A-fib and was placed on Cardizem gtt.  10/28, pauses on monitors, developed severe abdominal pain repeat CT shows dilatation    Hospital-Problem based course  Recurrent C. difficile third episode this year, antigen/PCR positive toxin negative--failed Dificid Prolonged vancomycin taper as per ID --WBC slightly down from prior "Vancomycin 125 mg PO qid for 14 days; then Vancomycin 125 mg PO bid for 7 days; then Vancomycin 125 mg PO daily for 7 days; then Vancomycin 125 mg PO every other day for 28 days; and then Vancomycin 125 mg PO every 3 days for 28 days" GI graduated diet to soft She is tolerating this well and has not had significant stool since 10/29 [may be just 1 today] If continued stability, ?home tomorrow on Vanc c HH therapy Pain control Tylenol first choice, for severe pain oxycodone 5  every 4 as needed Patient planning for fecal transplant per GI as outpatient  A-fib RVR on admission  Resolved Sinus pauses about 3-1/2 to 4 seconds Weaned Cardizem gtt. --Cut back Cardizem and changed to 120 CD  Continue apixaban 2.5 twice daily  Hypokalemia on admission now resolved Replaced with oral and IV and now overall seems to be resolved  Severe malnutrition Hold on placing any type of parenteral feeding device at this time Expect nutrition will improve as patient improves and she is eating more today  DVT prophylaxis: Apixaban 2.5 twice daily Code Status: Full Family Communication: None Disposition:  Status is: Inpatient Remains inpatient appropriate because:   Likely can discharge in a.m. if better     Consultants:  Infectious disease Gastroenterology  Procedures: None  Antimicrobials: Oral vancomycin   Subjective:  Still some abdominal pain but seems to be better No fever no chills no nausea no vomiting Appears rather comfortable and is eating food at the bedside not drinking shakes  Objective: Vitals:   02/25/22 1715 02/26/22 0500 02/26/22 0948 02/26/22 1156  BP:  124/67 138/64 127/63  Pulse:   67 69  Resp: '18  18 20  '$ Temp: 97.9 F (36.6 C) 98.2 F (36.8 C) (!) 97.4 F (36.3 C) 97.7 F (36.5 C)  TempSrc: Oral Oral Oral Oral  SpO2:   98% 97%  Weight:      Height:        Intake/Output Summary (Last 24 hours) at 02/26/2022 1434 Last data filed at 02/25/2022 1500 Gross per 24 hour  Intake 3125.61 ml  Output --  Net 3125.61 ml    Filed Weights   02/21/22 1507  Weight: 41.3 kg  Examination:  Frail white female cachectic No wheeze rales rhonchi  S1-S2 no murmur Abdomen remains distended, slightly tender--No rebound no guarding No lower extremity edema Power is grossly 5/5 major muscle groups  Data Reviewed: personally reviewed   CBC    Component Value Date/Time   WBC 10.3 02/26/2022 0107   RBC 3.96 02/26/2022 0107   HGB  11.9 (L) 02/26/2022 0107   HCT 35.3 (L) 02/26/2022 0107   PLT 309 02/26/2022 0107   MCV 89.1 02/26/2022 0107   MCH 30.1 02/26/2022 0107   MCHC 33.7 02/26/2022 0107   RDW 15.3 02/26/2022 0107   LYMPHSABS 0.9 02/26/2022 0107   MONOABS 1.2 (H) 02/26/2022 0107   EOSABS 0.0 02/26/2022 0107   BASOSABS 0.0 02/26/2022 0107      Latest Ref Rng & Units 02/26/2022    1:07 AM 02/25/2022    1:08 AM 02/24/2022    1:08 AM  CMP  Glucose 70 - 99 mg/dL 146  132  147   BUN 8 - 23 mg/dL '19  15  15   '$ Creatinine 0.44 - 1.00 mg/dL 0.59  0.53  0.61   Sodium 135 - 145 mmol/L 130  130  129   Potassium 3.5 - 5.1 mmol/L 4.1  4.3  4.1   Chloride 98 - 111 mmol/L 98  98  100   CO2 22 - 32 mmol/L '25  21  20   '$ Calcium 8.9 - 10.3 mg/dL 9.6  9.8  9.0   Total Protein 6.5 - 8.1 g/dL 5.4     Total Bilirubin 0.3 - 1.2 mg/dL 0.4     Alkaline Phos 38 - 126 U/L 90     AST 15 - 41 U/L 15     ALT 0 - 44 U/L 19        Radiology Studies: No results found.   Scheduled Meds:  acidophilus  1 capsule Oral Daily   apixaban  2.5 mg Oral BID   diltiazem  120 mg Oral Daily   feeding supplement  1 Container Oral TID BM   feeding supplement  237 mL Oral BID BM   multivitamin with minerals  1 tablet Oral Daily   saccharomyces boulardii  250 mg Oral BID   sodium chloride flush  3 mL Intravenous Q12H   vancomycin  125 mg Oral QID   Followed by   Derrill Memo ON 03/08/2022] vancomycin  125 mg Oral BID   Followed by   Derrill Memo ON 03/16/2022] vancomycin  125 mg Oral Daily   Followed by   Derrill Memo ON 03/23/2022] vancomycin  125 mg Oral QODAY   Followed by   Derrill Memo ON 04/20/2022] vancomycin  125 mg Oral Q3 days   Continuous Infusions:  lactated ringers 50 mL/hr at 02/25/22 1100     LOS: 4 days   Time spent: 105  Nita Sells, MD [facility] To contact the attending provider between 7A-7P or the covering provider during after hours 7P-7A, please log into the web site [email or web address] and access using universal [facility] password for that web site. If you do not have the password, please call the hospital operator.  02/26/2022, 2:34 PM

## 2022-02-26 NOTE — Telephone Encounter (Signed)
-----   Message from Ladene Artist, MD sent at 02/25/2022  5:32 PM EDT ----- Regarding: RE: Patient in hospital will not make 1031 appointment I reviewed the plan for a prolonged vanco course and referral to [facility] for consideration of FMT. We will cancel her 10/31 office appt. Thanks. ----- Message ----- From: Gatha Mayer, MD Sent: 02/25/2022   4:49 PM EDT To: Ladene Artist, MD; Carl Best, RN Subject: Patient in hospital will not make 1031 appoi#  She is back in with recurrent C. difficile now on vancomycin.  Amy has already reached out to [facility] regarding fecal transplant visit post discharge.  FYI

## 2022-02-26 NOTE — Progress Notes (Signed)
     Progress Note    ASSESSMENT AND PLAN:   Recurrent C. difficile colitis (3rd episode). Neg colon Noc 2020 Mild colonic ileus (resolving). No fulminant colitis Multiple comorbidities including A-fib with RVR on Cardizem/Eliquis, mild dementia, debilitation.  Plan: -Ambulate. -Pulsed vancomycin (as ordered) -Consider fecal transplant/[facility] as outpt -Serial x-ray KUB's -Keep K>4, Mg>2 -Agree to hold bentyl -Avoid Imodium -I have reviewed CT AP from 10/26,10/28. -D/W nursing staff.   SUBJECTIVE   Had Just 1 BM this AM per nursing staff. No nausea or vomiting Had abdominal pain which is better with morphine. Sitting comfortably on recliner  WBC count down to 10.3 No fever or chills  OBJECTIVE:     Vital signs in last 24 hours: Temp:  [97.4 F (36.3 C)-98.2 F (36.8 C)] 97.4 F (36.3 C) (10/30 0948) Pulse Rate:  [66-67] 67 (10/30 0948) Resp:  [18-20] 18 (10/30 0948) BP: (124-138)/(61-67) 138/64 (10/30 0948) SpO2:  [92 %-98 %] 98 % (10/30 0948) Last BM Date : 02/25/22 General:   Alert, thin female in NAD EENT:  Normal hearing Heart:  Regular rate and rhythm; no murmur.  No lower extremity edema   Pulm: Normal respiratory effort, lungs CTA bilaterally without wheezes or crackles. Abdomen:  Soft, mildly distended, nontender.  Normal bowel sounds,.       Neurologic:  Alert and  oriented x4;  grossly normal neurologically. Psych:  Pleasant, cooperative.  Normal mood and affect.   Intake/Output from previous day: 10/29 0701 - 10/30 0700 In: 3615.6 [P.O.:490; I.V.:3125.6] Out: -  Intake/Output this shift: No intake/output data recorded.  Lab Results: Recent Labs    02/24/22 0108 02/25/22 0108 02/26/22 0107  WBC 7.1 11.3* 10.3  HGB 11.1* 11.7* 11.9*  HCT 32.4* 34.1* 35.3*  PLT 241 274 309   BMET Recent Labs    02/24/22 0108 02/25/22 0108 02/26/22 0107  NA 129* 130* 130*  K 4.1 4.3 4.1  CL 100 98 98  CO2 20* 21* 25  GLUCOSE 147* 132* 146*   BUN '15 15 19  '$ CREATININE 0.61 0.53 0.59  CALCIUM 9.0 9.8 9.6   LFT Recent Labs    02/26/22 0107  PROT 5.4*  ALBUMIN 2.2*  AST 15  ALT 19  ALKPHOS 90  BILITOT 0.4   PT/INR No results for input(s): "LABPROT", "INR" in the last 72 hours. Hepatitis Panel No results for input(s): "HEPBSAG", "HCVAB", "HEPAIGM", "HEPBIGM" in the last 72 hours.  No results found.   Principal Problem:   Recurrent colitis due to Clostridioides difficile Active Problems:   Paroxysmal atrial fibrillation with RVR ([place])     LOS: 4 days     Carmell Austria, MD 02/26/2022, 11:34 AM [facility] [phone]

## 2022-02-26 NOTE — TOC Benefit Eligibility Note (Signed)
Patient Teacher, English as a foreign language completed.    The patient is currently admitted and upon discharge could be taking Vancomycin.  The current 30 day co-pay is $5.00.   The patient is insured through [employer] [place] Patient Advocate Specialist [place] Patient Advocate Team Direct Number: [phone] Fax: [phone]

## 2022-02-26 NOTE — Progress Notes (Signed)
Physical Therapy Treatment Patient Details Name: Julie Montgomery MRN: [id number] DOB: [birth year] Today's Date: 02/26/2022   History of Present Illness Patient is an 87 yo female presenting to the ED with diarrhea on 02/22/22. Recent admissions in 8/23, 9/23, for C-diff other admission from 10/10-10/16/23 with same symptoms but C-diff negative. Found to be tachycardic in ED. Admitted with c-diff. PMH includes: TIA, afib on Eliquis    PT Comments    Pt with acute confusion, however, is participatory and follows one step commands. Reports abdominal pain and bloating. Pt ambulating 160 ft with a walker at a min assist level. Demonstrates deficits in balance, strength, endurance and cognition. D/c plan remains appropriate.     Recommendations for follow up therapy are one component of a multi-disciplinary discharge planning process, led by the attending physician.  Recommendations may be updated based on patient status, additional functional criteria and insurance authorization.  Follow Up Recommendations  Home health PT     Assistance Recommended at Discharge Frequent or constant Supervision/Assistance  Patient can return home with the following A little help with walking and/or transfers;A little help with bathing/dressing/bathroom;Assistance with cooking/housework;Assist for transportation;Help with stairs or ramp for entrance   Equipment Recommendations  Rolling walker (2 wheels)    Recommendations for Other Services       Precautions / Restrictions Precautions Precautions: Fall Restrictions Weight Bearing Restrictions: No     Mobility  Bed Mobility Overal bed mobility: Needs Assistance Bed Mobility: Supine to Sit     Supine to sit: Min assist     General bed mobility comments: Use of bed pad to scoot hips out to edge of bed    Transfers Overall transfer level: Needs assistance Equipment used: Rolling walker (2 wheels) Transfers: Sit to/from Stand Sit to Stand: Min  assist           General transfer comment: MinA to power up from edge of bed and toilet. cueing for hand placement    Ambulation/Gait Ambulation/Gait assistance: Min assist Gait Distance (Feet): 160 Feet Assistive device: Rolling walker (2 wheels) Gait Pattern/deviations: Step-through pattern, Decreased stride length, Narrow base of support Gait velocity: decreased Gait velocity interpretation: <1.8 ft/sec, indicate of risk for recurrent falls   General Gait Details: Verbal cueing for walker proximity, environmental negotiation, activity pacing. light minA for balance   Stairs             Wheelchair Mobility    Modified Rankin (Stroke Patients Only)       Balance Overall balance assessment: Needs assistance Sitting-balance support: Feet supported Sitting balance-Leahy Scale: Fair     Standing balance support: Bilateral upper extremity supported, During functional activity Standing balance-Leahy Scale: Poor                              Cognition Arousal/Alertness: Awake/alert Behavior During Therapy: Flat affect Overall Cognitive Status: Impaired/Different from baseline Area of Impairment: Awareness, Safety/judgement, Following commands, Memory, Attention, Orientation                 Orientation Level: Disoriented to, Place Current Attention Level: Selective Memory: Decreased short-term memory Following Commands: Follows one step commands with increased time Safety/Judgement: Decreased awareness of safety, Decreased awareness of deficits Awareness: Intellectual   General Comments: pt frequently asking if her husband was outside of her room, initially thinking she was at home, then able to deduce she was in the hospital. decreased safety awareness  Exercises      General Comments        Pertinent Vitals/Pain Pain Assessment Pain Assessment: Faces Faces Pain Scale: Hurts even more Pain Location: abdomen Pain Descriptors /  Indicators: Discomfort, Grimacing, Guarding Pain Intervention(s): Limited activity within patient's tolerance, Monitored during session    Home Living                          Prior Function            PT Goals (current goals can now be found in the care plan section) Acute Rehab PT Goals Patient Stated Goal: to get home with husband Potential to Achieve Goals: Good Progress towards PT goals: Progressing toward goals    Frequency    Min 3X/week      PT Plan Current plan remains appropriate    Co-evaluation              AM-PAC PT "6 Clicks" Mobility   Outcome Measure  Help needed turning from your back to your side while in a flat bed without using bedrails?: A Little Help needed moving from lying on your back to sitting on the side of a flat bed without using bedrails?: A Little Help needed moving to and from a bed to a chair (including a wheelchair)?: A Little Help needed standing up from a chair using your arms (e.g., wheelchair or bedside chair)?: A Little Help needed to walk in hospital room?: A Little Help needed climbing 3-5 steps with a railing? : A Lot 6 Click Score: 17    End of Session Equipment Utilized During Treatment: Gait belt Activity Tolerance: Patient tolerated treatment well Patient left: in chair;with call bell/phone within reach;with chair alarm set Nurse Communication: Mobility status PT Visit Diagnosis: Unsteadiness on feet (R26.81);Muscle weakness (generalized) (M62.81);Difficulty in walking, not elsewhere classified (R26.2)     Time: 3267-1245 PT Time Calculation (min) (ACUTE ONLY): 30 min  Charges:  $Gait Training: 8-22 mins                     Wyona Almas, PT, DPT Acute Rehabilitation Services Office [place] 02/26/2022, 11:41 AM

## 2022-02-26 NOTE — Telephone Encounter (Signed)
Pharmacy Patient Advocate Encounter  Insurance verification completed.    The patient is insured through [employer]   The patient is currently admitted and ran test claims for the following: Vancomycin.  Copays and coinsurance results were relayed to Inpatient clinical team.

## 2022-02-26 NOTE — Care Management Important Message (Signed)
Important Message  Patient Details  Name: Julie Montgomery MRN: [id number] Date of Birth: Nov 17, 1935   Medicare Important Message Given:  Yes     [name] 02/26/2022, 10:37 AM

## 2022-02-27 ENCOUNTER — Ambulatory Visit: Payer: Medicare Other | Admitting: Gastroenterology

## 2022-02-27 ENCOUNTER — Ambulatory Visit: Payer: Medicare Other | Admitting: Internal Medicine

## 2022-02-27 ENCOUNTER — Inpatient Hospital Stay (HOSPITAL_COMMUNITY): Payer: Medicare Other

## 2022-02-27 ENCOUNTER — Other Ambulatory Visit (HOSPITAL_COMMUNITY): Payer: Self-pay

## 2022-02-27 DIAGNOSIS — A0471 Enterocolitis due to Clostridium difficile, recurrent: Secondary | ICD-10-CM | POA: Diagnosis not present

## 2022-02-27 LAB — BASIC METABOLIC PANEL
Anion gap: 8 (ref 5–15)
BUN: 20 mg/dL (ref 8–23)
CO2: 22 mmol/L (ref 22–32)
Calcium: 9.1 mg/dL (ref 8.9–10.3)
Chloride: 99 mmol/L (ref 98–111)
Creatinine, Ser: 0.43 mg/dL — ABNORMAL LOW (ref 0.44–1.00)
GFR, Estimated: >60 mL/min (ref >60)
Glucose, Bld: 121 mg/dL — ABNORMAL HIGH (ref 70–99)
Potassium: 4.3 mmol/L (ref 3.5–5.1)
Sodium: 129 mmol/L — ABNORMAL LOW (ref 135–145)

## 2022-02-27 LAB — CBC WITH DIFFERENTIAL/PLATELET
Abs Immature Granulocytes: 0.16 10*3/uL — ABNORMAL HIGH (ref 0.00–0.07)
Basophils Absolute: 0.1 10*3/uL (ref 0.0–0.1)
Basophils Relative: 1 %
Eosinophils Absolute: 0 10*3/uL (ref 0.0–0.5)
Eosinophils Relative: 0 %
HCT: 34.1 % — ABNORMAL LOW (ref 36.0–46.0)
Hemoglobin: 11.7 g/dL — ABNORMAL LOW (ref 12.0–15.0)
Immature Granulocytes: 2 %
Lymphocytes Relative: 10 %
Lymphs Abs: 1.1 10*3/uL (ref 0.7–4.0)
MCH: 30.5 pg (ref 26.0–34.0)
MCHC: 34.3 g/dL (ref 30.0–36.0)
MCV: 89 fL (ref 80.0–100.0)
Monocytes Absolute: 1.2 10*3/uL — ABNORMAL HIGH (ref 0.1–1.0)
Monocytes Relative: 12 %
Neutro Abs: 7.7 10*3/uL (ref 1.7–7.7)
Neutrophils Relative %: 75 %
Platelets: 312 10*3/uL (ref 150–400)
RBC: 3.83 MIL/uL — ABNORMAL LOW (ref 3.87–5.11)
RDW: 15.5 % (ref 11.5–15.5)
WBC: 10.2 10*3/uL (ref 4.0–10.5)
nRBC: 0 % (ref 0.0–0.2)

## 2022-02-27 MED ORDER — VANCOMYCIN HCL 125 MG PO CAPS
125.0000 mg | ORAL_CAPSULE | Freq: Every day | ORAL | 0 refills | Status: DC
  Filled 2022-02-27: qty 7, 7d supply, fill #0

## 2022-02-27 MED ORDER — VANCOMYCIN HCL 125 MG PO CAPS
125.0000 mg | ORAL_CAPSULE | Freq: Two times a day (BID) | ORAL | 0 refills | Status: DC
  Filled 2022-02-27: qty 14, 7d supply, fill #0

## 2022-02-27 MED ORDER — DILTIAZEM HCL ER COATED BEADS 120 MG PO CP24
120.0000 mg | ORAL_CAPSULE | Freq: Every day | ORAL | 2 refills | Status: DC
  Filled 2022-02-27 (×2): qty 30, 30d supply, fill #0

## 2022-02-27 MED ORDER — VANCOMYCIN HCL 125 MG PO CAPS
ORAL_CAPSULE | ORAL | 0 refills | Status: DC
  Filled 2022-02-27 (×2): qty 60, 23d supply, fill #0

## 2022-02-27 MED ORDER — VANCOMYCIN HCL 125 MG PO CAPS: ORAL_CAPSULE | ORAL | 0 refills | Status: DC

## 2022-02-27 MED ORDER — FUROSEMIDE 10 MG/ML IJ SOLN
40.0000 mg | Freq: Once | INTRAMUSCULAR | Status: AC
  Administered 2022-02-27: 40 mg via INTRAVENOUS
  Filled 2022-02-27: qty 4

## 2022-02-27 MED ORDER — VANCOMYCIN HCL 125 MG PO CAPS
125.0000 mg | ORAL_CAPSULE | ORAL | 0 refills | Status: DC
  Filled 2022-02-27: qty 9, 27d supply, fill #0

## 2022-02-27 MED ORDER — VANCOMYCIN HCL 125 MG PO CAPS
125.0000 mg | ORAL_CAPSULE | ORAL | 0 refills | Status: DC
  Filled 2022-02-27: qty 14, 28d supply, fill #0

## 2022-02-27 MED ORDER — VANCOMYCIN HCL 125 MG PO CAPS
125.0000 mg | ORAL_CAPSULE | Freq: Four times a day (QID) | ORAL | 0 refills | Status: DC
  Filled 2022-02-27: qty 32, 8d supply, fill #0

## 2022-02-27 NOTE — Progress Notes (Signed)
Advised pt of MD order to place rectal tube. Pt is very anxious. Did not understand the need for tube and wants to discuss with MD in the morning. Would prefer to wait until her husband is here so she can talk to him about it. Refuses placement of rectal tube at this time.

## 2022-02-27 NOTE — Progress Notes (Signed)
Physical Therapy Treatment Patient Details Name: Julie Montgomery MRN: [id number] DOB: 1936-02-26 Today's Date: 02/27/2022   History of Present Illness Patient is an 86 yo female presenting to the ED with diarrhea on 02/22/22. Recent admissions in 8/23, 9/23, for C-diff other admission from 10/10-10/16/23 with same symptoms but C-diff negative. Found to be tachycardic in ED. Admitted with c-diff. PMH includes: TIA, afib on Eliquis    PT Comments    Pt progressing steadily towards her physical therapy goals, ambulating 200 ft with a walker at a min guard assist level. Continues to report abdominal tightness and discomfort; also with noted increased BLE edema. Education provided to pt spouse regarding pt current level of assist/recommended supervision, fall prevention techniques, DME, and activity recommendations. Pt will benefit from HHPT at d/c.   Recommendations for follow up therapy are one component of a multi-disciplinary discharge planning process, led by the attending physician.  Recommendations may be updated based on patient status, additional functional criteria and insurance authorization.  Follow Up Recommendations  Home health PT     Assistance Recommended at Discharge Frequent or constant Supervision/Assistance  Patient can return home with the following A little help with walking and/or transfers;A little help with bathing/dressing/bathroom;Assistance with cooking/housework;Assist for transportation;Help with stairs or ramp for entrance   Equipment Recommendations  Rolling walker (2 wheels)    Recommendations for Other Services       Precautions / Restrictions Precautions Precautions: Fall Restrictions Weight Bearing Restrictions: No     Mobility  Bed Mobility               General bed mobility comments: OOB in recliner upon entry    Transfers Overall transfer level: Needs assistance Equipment used: Rolling walker (2 wheels) Transfers: Sit to/from  Stand Sit to Stand: Min guard           General transfer comment: Cues for scooting out to edge of seat, hand placement. no physical assist to power up from chair    Ambulation/Gait Ambulation/Gait assistance: Min guard Gait Distance (Feet): 200 Feet Assistive device: Rolling walker (2 wheels) Gait Pattern/deviations: Step-through pattern, Decreased stride length, Narrow base of support Gait velocity: decreased Gait velocity interpretation: <1.8 ft/sec, indicate of risk for recurrent falls   General Gait Details: Slow and steady pace, cues for walker proximity and environmental navigation   Stairs             Wheelchair Mobility    Modified Rankin (Stroke Patients Only)       Balance Overall balance assessment: Needs assistance Sitting-balance support: Feet supported Sitting balance-Leahy Scale: Fair     Standing balance support: Bilateral upper extremity supported, During functional activity Standing balance-Leahy Scale: Poor Standing balance comment: relies on RW, min guard during ADLs without UE support                            Cognition Arousal/Alertness: Awake/alert Behavior During Therapy: Flat affect Overall Cognitive Status: Impaired/Different from baseline Area of Impairment: Awareness, Safety/judgement, Following commands, Memory, Attention, Problem solving                   Current Attention Level: Sustained Memory: Decreased recall of precautions, Decreased short-term memory Following Commands: Follows one step commands consistently, Follows one step commands with increased time, Follows multi-step commands inconsistently Safety/Judgement: Decreased awareness of safety, Decreased awareness of deficits Awareness: Intellectual Problem Solving: Slow processing, Decreased initiation, Difficulty sequencing, Requires verbal cues, Requires tactile  cues General Comments: She follows simple 1 step commands with increased time but  requires cueing for initation, sequencing and safety.  Poor carryover of task completion.        Exercises      General Comments General comments (skin integrity, edema, etc.): Spoke to spouse about recommendations for 24/7 support, he voiced understanding.  He reports her memory is worse than it has been, but he has been managing her medications prior to admission.      Pertinent Vitals/Pain Pain Assessment Pain Assessment: Faces Faces Pain Scale: Hurts little more Pain Location: abdomen Pain Descriptors / Indicators: Discomfort, Grimacing, Tightness Pain Intervention(s): Monitored during session    Home Living                          Prior Function            PT Goals (current goals can now be found in the care plan section) Acute Rehab PT Goals Potential to Achieve Goals: Good Progress towards PT goals: Progressing toward goals    Frequency    Min 3X/week      PT Plan Current plan remains appropriate    Co-evaluation              AM-PAC PT "6 Clicks" Mobility   Outcome Measure  Help needed turning from your back to your side while in a flat bed without using bedrails?: A Little Help needed moving from lying on your back to sitting on the side of a flat bed without using bedrails?: A Little Help needed moving to and from a bed to a chair (including a wheelchair)?: A Little Help needed standing up from a chair using your arms (e.g., wheelchair or bedside chair)?: A Little Help needed to walk in hospital room?: A Little Help needed climbing 3-5 steps with a railing? : A Lot 6 Click Score: 17    End of Session Equipment Utilized During Treatment: Gait belt Activity Tolerance: Patient tolerated treatment well Patient left: in chair;with call bell/phone within reach;with chair alarm set Nurse Communication: Mobility status PT Visit Diagnosis: Unsteadiness on feet (R26.81);Muscle weakness (generalized) (M62.81);Difficulty in walking, not  elsewhere classified (R26.2)     Time: 6314-9702 PT Time Calculation (min) (ACUTE ONLY): 50 min  Charges:  $Gait Training: 8-22 mins $Therapeutic Activity: 8-22 mins $Self Care/Home Management: 8-22                     Wyona Almas, PT, DPT Acute Rehabilitation Services Office [phone]    Deno Etienne 02/27/2022, 1:34 PM

## 2022-02-27 NOTE — Progress Notes (Signed)
Mobility Specialist Progress Note:   02/27/22 1016  Mobility  Activity Ambulated with assistance in room;Transferred to/from [facility]  Level of Assistance Minimal assist, patient does 75% or more  Assistive Device Front wheel walker  Distance Ambulated (ft) 6 ft  Activity Response Tolerated fair  $Mobility charge 1 Mobility   Pt received in bed asking to use BSC. Complaints of abdominal pain. MinA to stand. Max A for peri-care. Left in chair with call bell in reach and all needs met.   [facility] Surveyor, mining Chat only

## 2022-02-27 NOTE — Consult Note (Addendum)
   [facility] CM Inpatient Consult   02/27/2022  MARCELLA CHARLSON [birth year] [id number]   [place] [facility] [ACO] Patient:  Medicare ACO REACH   Primary Care Provider: Cassandria Anger, MD, with [place] at [facility] which is a provider listed for the [facility] follow up needs   Referral:  [facility] Readmission Report review   Patient screened for less than 30 days readmission hospitalization with noted extreme high risk score for unplanned readmission risk.  Reviewed to assess for potential [place] Management service needs for post hospital transition for readmission prevention needs.  Review of patient's medical record reveals patient is to transition home    1350: Came by to check for transitional needs and staff working with patient on rounds. [name] electronic medical record reviewed for potential needs.   Plan:  Continue to follow progress and disposition to assess for post hospital care management needs.  Follow up wit [facility] team for post hospital needs. Referral request anticipated for readmission prevention follow up and send information for readmission report review.   For questions contact:    Natividad Brood, RN BSN [place]  [phone] business mobile phone Toll free office [phone]  *[place]  [phone] Fax number: [phone] [email or web address]

## 2022-02-27 NOTE — Discharge Summary (Signed)
Physician Discharge Summary  Julie Montgomery JME:[id number] DOB: 26-May-1935 DOA: 02/21/2022  PCP: Cassandria Anger, MD  Admit date: 02/21/2022 Discharge date: 02/27/2022  Time spent: 36 minutes  Recommendations for Outpatient Follow-up:  Long vancomycin taper as per instructions ordered for patient Will need home health as well as rolling walker and bedside commode on discharge which were ordered We will attempt to facilitate outpatient referral to tertiary center through GI-we will need labs in about 1 week at PCP office to make sure still doing fair   Discharge Diagnoses:  MAIN problem for hospitalization   Recurrent C. difficile colitis improved Prior TIA Basal cell CA Known A-fib CHADVASC >5 Recurrent C. difficile with 6 episodes of recurrences  Please see below for itemized issues addressed in [place]- refer to other progress notes for clarity if needed  Disch regular arge Condition: Improved  Diet recommendation: Regular  Filed Weights   02/21/22 1507  Weight: 41.3 kg    History of present illness:  89 white female mild dementia/prior small TIA basal cell CA bronchiectasis known A-fib CHADVASC >5, sessile polyp on colonoscopy 03/2019 tubular adenoma, EGD 2020 small hiatal hernia followed by Dr. Fuller Plan with H. pylori Recurrent C. difficile X6 (2016 2018 11/2021) last admission 10/10 through 02/12/2022-also UTI at the time?  Initially treated with Dificid-ultimately felt to be postinfectious colitis + not C. difficile-Rx Entocort Bentyl probiotics and hospitalization complicated by A-fib RVR--- she was still taking Entocort probiotics align and Florastor On 10/24 developed large episodes of incontinence of watery stool with not really eating and drinking had abdominal tenderness additionally   C. difficile quick screen positive C. difficile antigen negative toxin PCR positive CT abdomen pelvis progressive moderate dilatation proximal to mid colon mucosal  hyperenhancement air-fluid levels   GI as well as ID saw the patient Developed persistent A-fib and was placed on Cardizem gtt.   10/28, pauses on monitors, developed severe abdominal pain repeat CT shows dilatation  Hospital Course:  Recurrent C. difficile third episode this year, antigen/PCR positive toxin negative--failed Dificid Prolonged vancomycin taper as per ID --WBC slightly down from prior "Vancomycin 125 mg PO qid for 14 days; then Vancomycin 125 mg PO bid for 7 days; then Vancomycin 125 mg PO daily for 7 days; then Vancomycin 125 mg PO every other day for 28 days; and then Vancomycin 125 mg PO every 3 days for 28 days" GI graduated diet to soft and she is tolerating this with less stool Since 10/29 Discussed her imaging findings and exam with gastroenterology and feel that this is not an ileus that she always has some dilatation and given her dementia it is not clear how much of her pain is chronic and how much she actually is experiencing as she cannot report clearly how many stools she has had what she is eating etc. As she looks nontoxic no white count and no other concerns both GI and myself felt she is relatively stabilized for discharge-home with home health we will order walker etc.-GI to coordinate tertiary referral as an outpatient for fecal transplant  A-fib RVR on admission  Resolved Sinus pauses about 3-1/2 to 4 seconds Weaned Cardizem gtt. --Cut back Cardizem and changed to 120 CD  Continue apixaban 2.5 twice daily   Hypokalemia on admission now resolved Replaced with oral and IV and now overall seems to be resolved   Severe malnutrition Hold on placing any type of parenteral feeding device at this time Patient had swelling lower extremity likely secondary to  poor appetite and malnutrition-I gave a dose of Lasix x1 Expect nutrition will improve as patient improves and she is eating more today   Discharge Exam: Vitals:   02/27/22 1000 02/27/22 1038  BP: (!)  144/69 (!) 143/76  Pulse: (!) 58 65  Resp:  (!) 21  Temp: 97.6 F (36.4 C) 97.7 F (36.5 C)  SpO2: 100% 98%    Subj on day of d/c   Awake coherent x1 1 stool only today reported Ambulatory with some assistance and able to do some IADLs  General Exam on discharge  No icterus no pallor Chest clear no added sounds Abdomen soft although distended  Discharge Instructions   Discharge Instructions     Diet - low sodium heart healthy   Complete by: As directed    Discharge instructions   Complete by: As directed    Will need outpatient referral to tertiary center for fecal transplant as well as follow-up in the outpatient setting with GI--we will CC gastroenterology to ensure that they are aware Would recommend labs in about 1 week Please look at the dosing of the vancomycin carefully this will be called in for you and you need to take the entire course to prevent a recurrence-be very careful in the future taking any antibiotics from any other source and let them know your problems with recurrent C. difficile as this can cause this to recur We will get home health to come out and assist you and we will order rolling walker etc. I feel that the fluid that you have gained on the lower extremities is probably because of a nutritional component with low protein in your blood but also because we gave you some fluid and we will try to get some off of you before you are discharged If you have high fevers chills and or more than 5-6 stools a day I would recommend you come back to the hospital   Increase activity slowly   Complete by: As directed       Allergies as of 02/27/2022       Reactions   Lovastatin Other (See Comments)   Unknown reaction   Mometasone Furo-formoterol Fum Other (See Comments)   Loss of appetite, laryngitis    Prozac [fluoxetine Hcl] Other (See Comments)   Jumpy   Sulfa Antibiotics Other (See Comments)   Unknown reaction        Medication List     STOP  taking these medications    budesonide 3 MG 24 hr capsule Commonly known as: ENTOCORT EC   dicyclomine 10 MG capsule Commonly known as: BENTYL   loperamide 2 MG capsule Commonly known as: IMODIUM   VITAMIN D PO       TAKE these medications    Align 4 MG Caps Take 1 capsule (4 mg total) by mouth daily.   diltiazem 120 MG 24 hr capsule Commonly known as: CARDIZEM CD Take 1 capsule (120 mg total) by mouth daily. Start taking on: February 28, 2022   Eliquis 2.5 MG Tabs tablet Generic drug: apixaban Take 2.5 mg by mouth 2 (two) times daily.   memantine 5 MG tablet Commonly known as: NAMENDA Take 5 mg by mouth 2 (two) times daily.   saccharomyces boulardii 250 MG capsule Commonly known as: FLORASTOR Take 1 capsule (250 mg total) by mouth 2 (two) times daily.   vancomycin 125 MG capsule Commonly known as: VANCOCIN Take 1 capsule (125 mg total) by mouth 4 (four) times daily for  8 days.   vancomycin 125 MG capsule Commonly known as: VANCOCIN Take 1 capsule (125 mg total) by mouth 2 (two) times daily for 7 days. Start taking on: March 08, 2022   vancomycin 125 MG capsule Commonly known as: VANCOCIN Take 1 capsule (125 mg total) by mouth daily for 7 days. Start taking on: March 16, 2022   vancomycin 125 MG capsule Commonly known as: VANCOCIN Take 1 capsule (125 mg total) by mouth every other day for 28 days. Start taking on: March 23, 2022   vancomycin 125 MG capsule Commonly known as: VANCOCIN Take 1 capsule (125 mg total) by mouth every 3 (three) days for 28 days. Start taking on: April 20, 2022               Durable Medical Equipment  (From admission, onward)           Start     Ordered   02/27/22 1122  DME 3-in-1  Once        02/27/22 1124   02/27/22 1122  DME Walker  Once       Question Answer Comment  Walker: With 5 Inch Wheels   Patient needs a walker to treat with the following condition Weak      02/27/22 1124            Allergies  Allergen Reactions   Lovastatin Other (See Comments)    Unknown reaction   Mometasone Furo-Formoterol Fum Other (See Comments)    Loss of appetite, laryngitis    Prozac [Fluoxetine Hcl] Other (See Comments)    Jumpy   Sulfa Antibiotics Other (See Comments)    Unknown reaction    Follow-up Information     Golden Circle, FNP Follow up.   Specialty: Infectious Diseases Why: 2:30 pm on 11/8. Please call to reschedule if you are not able to make this appointment. Contact information: [place] [address] [phone]                  The results of significant diagnostics from this hospitalization (including imaging, microbiology, ancillary and laboratory) are listed below for reference.    Significant Diagnostic Studies: DG Abd Portable 1V  Result Date: 02/27/2022 CLINICAL DATA:  Ileus ([place]) EXAM: PORTABLE ABDOMEN - 1 VIEW COMPARISON:  02/26/2022 FINDINGS: Persistent diffuse gaseous distension, measures up to 12.8 cm, previously 12.6 cm, remeasured for consistency. No small bowel dilation. Stomach is likely decompressed. Somewhat featureless portions of the colon, unchanged. IMPRESSION: Persistent significant dilation of the colon, not significantly changed from prior. Electronically Signed   By: Maurine Simmering M.D.   On: 02/27/2022 08:47   DG Abd Portable 1V  Result Date: 02/26/2022 CLINICAL DATA:  Abdominal distention EXAM: PORTABLE ABDOMEN - 1 VIEW COMPARISON:  Previous studies including the examination of 02/24/2022 FINDINGS: There is gaseous distention of colon measuring up to 11.4 cm. There is interval increase in colonic distention. There is no small bowel dilation. Stomach is not distended. Gas is present in rectum. IMPRESSION: There is gaseous distention of colon with slight interval worsening. This may suggest ileus. There is no small bowel dilation. Electronically Signed   By: Elmer Picker M.D.   On: 02/26/2022 16:49    CT ABDOMEN PELVIS W CONTRAST  Result Date: 02/24/2022 CLINICAL DATA:  Abdominal pain, acute, nonlocalized, C. difficile colitis with worsening abdominal pain. EXAM: CT ABDOMEN AND PELVIS WITH CONTRAST TECHNIQUE: Multidetector CT imaging of the abdomen and pelvis was  performed using the standard protocol following bolus administration of intravenous contrast. RADIATION DOSE REDUCTION: This exam was performed according to the departmental dose-optimization program which includes automated exposure control, adjustment of the mA and/or kV according to patient size and/or use of iterative reconstruction technique. CONTRAST:  50m OMNIPAQUE IOHEXOL 350 MG/ML SOLN COMPARISON:  02/22/2022. FINDINGS: Lower chest: Airway wall thickening, peripheral mucous plugging, mild dependent atelectasis, similar to the prior study. Hepatobiliary: No focal liver abnormality is seen. Status post cholecystectomy. No biliary dilatation. Pancreas: Unremarkable. No pancreatic ductal dilatation or surrounding inflammatory changes. Spleen: Normal in size without focal abnormality. Adrenals/Urinary Tract: No adrenal mass. Kidneys normal in size, orientation and position. 1.3 cm stable left midpole renal cyst. No follow-up recommended. No other masses, no stones and no hydronephrosis. Ureters normal in course and in caliber. Bladder is unremarkable. Stomach/Bowel: Dilated colon, predominantly the ascending and transverse, maximum 7.8 cm, increased compared to the most recent prior CT. There are colonic air-fluid levels. Distal colon remains decompressed. Wall is suboptimally assessed due to the lack of distension and contrast. Mild wall thickening suggested along the mid sigmoid similar to the prior study. No other areas of colonic wall thickening. Unremarkable stomach.  Small bowel is decompressed. Vascular/Lymphatic: Peripherally calcified splenic artery aneurysm, 1.5 cm, stable. Aortic atherosclerosis. No aortic aneurysm. No enlarged  lymph nodes. Reproductive: Status post hysterectomy. No adnexal masses. Other: None. Musculoskeletal: No fracture or acute finding.  No bone lesion. IMPRESSION: 1. Colonic dilation, with associated air-fluid levels. Degree of colon dilation is increased from the previous CT. Mild wall thickening is again suggested along the distal colon similar to the prior study consistent with persistent C diff colitis. No new areas of bowel inflammation. 2. No other change from the prior exam.  No other acute abnormality. 3. Stable thrombosed splenic artery aneurysm. Aortic atherosclerosis. Electronically Signed   By: DLajean ManesM.D.   On: 02/24/2022 09:32   DG Abd 1 View  Result Date: 02/24/2022 CLINICAL DATA:  Abdominal pain EXAM: ABDOMEN - 1 VIEW COMPARISON:  CT 02/22/2022 FINDINGS: Gaseous distension of the colon up to the distal descending colon is again noted is again identified. The cecum which is in the midline of the abdomen measures 10.7 cm in diameter compared with 7.9 cm previously. No dilated small bowel loops identified. No signs of pneumatosis or pneumoperitoneum. IMPRESSION: 1. Persistent, slightly progressive gaseous distension of the colon up to the distal descending colon. 2. No pneumoperitoneum or signs of pneumatosis. Electronically Signed   By: TKerby MoorsM.D.   On: 02/24/2022 06:19   CT ABDOMEN PELVIS W CONTRAST  Result Date: 02/22/2022 CLINICAL DATA:  Diffuse abdominal pain with diarrhea for several days. EXAM: CT ABDOMEN AND PELVIS WITH CONTRAST TECHNIQUE: Multidetector CT imaging of the abdomen and pelvis was performed using the standard protocol following bolus administration of intravenous contrast. RADIATION DOSE REDUCTION: This exam was performed according to the departmental dose-optimization program which includes automated exposure control, adjustment of the mA and/or kV according to patient size and/or use of iterative reconstruction technique. CONTRAST:  73mOMNIPAQUE IOHEXOL  350 MG/ML SOLN COMPARISON:  Abdominopelvic CT 01/11/2022 and 12/24/2021. FINDINGS: Lower chest: Similar chronic central airway thickening and mucous plugging at both lung bases. No airspace disease or suspicious nodularity. No pleural or pericardial effusion. There is a small hiatal hernia. Hepatobiliary: The liver is normal in density without suspicious focal abnormality. Stable mild intra and extrahepatic biliary dilatation status post cholecystectomy, within physiologic limits. Pancreas: Unremarkable. No pancreatic ductal  dilatation or surrounding inflammatory changes. Spleen: Normal in size without focal abnormality. Adrenals/Urinary Tract: Both adrenal glands appear normal. No evidence of urinary tract calculus, hydronephrosis or perinephric soft tissue stranding. A simple cyst in the interpolar region of the left kidney is stable, for which no follow-up imaging recommended. Anteriorly in the interpolar region of the right kidney, there is a possible new small area of cortical scarring or inflammation without associated perinephric fluid collection. The bladder is moderately distended without focal abnormality. Stomach/Bowel: No enteric contrast administered. The stomach is decompressed. No small bowel distension, wall thickening or surrounding inflammation. Progressive moderate dilatation of the proximal to mid colon with mild wall thickening, mucosal hyperenhancement and scattered air-fluid levels. The cecum is located in the central upper abdomen. The distal colon is decompressed with persistent wall thickening and surrounding inflammation, remaining suspicious for diffuse colitis. No evidence of pneumatosis or perforation. Vascular/Lymphatic: There are no enlarged abdominal or pelvic lymph nodes. Aortic and branch vessel atherosclerosis without acute vascular findings. Unchanged chronically thrombosed splenic artery aneurysm measuring 1.5 cm. Reproductive: Hysterectomy.  No adnexal mass is identified.  Other: No ascites, pneumoperitoneum or focal extraluminal fluid collections identified. Musculoskeletal: No acute or significant osseous findings. Lower lumbar facet arthropathy. IMPRESSION: 1. Persistent findings of diffuse colitis in this patient with a history of C difficile colitis. Progressive moderate dilatation of the proximal to mid colon without evidence of obstruction or perforation. 2. Possible new small area of cortical scarring or inflammation in the interpolar region of the right kidney. No evidence of urinary tract calculus or hydronephrosis. 3. Stable mild intra and extrahepatic biliary dilatation status post cholecystectomy, within physiologic limits. 4. Stable chronic central airway thickening and mucous plugging at both lung bases. 5. Stable chronically thrombosed splenic artery aneurysm. 6.  Aortic Atherosclerosis (ICD10-I70.0). Electronically Signed   By: Richardean Sale M.D.   On: 02/22/2022 11:46   DG Abd 1 View  Result Date: 02/20/2022 CLINICAL DATA:  Lower abdominal distension.  Diarrhea EXAM: ABDOMEN - 1 VIEW COMPARISON:  Abdomen 02/07/2022 FINDINGS: Normal bowel gas pattern. Gas in nondilated colon. Small amount of stool in the colon Surgical clips right upper quadrant. No urinary tract calculi. No acute skeletal abnormality. IMPRESSION: Negative. Electronically Signed   By: Franchot Gallo M.D.   On: 02/20/2022 13:49   DG Abd 2 Views  Result Date: 02/07/2022 CLINICAL DATA:  Abdominal pain. EXAM: ABDOMEN - 2 VIEW COMPARISON:  Acute abdominal series 03/22/2010. Abdominal CT 01/11/2022. FINDINGS: The bowel gas pattern is nonobstructive. There is no free intraperitoneal air or suspicious abdominal calcification. Cholecystectomy clips are noted. There is multifocal costal calcification. Grossly stable peripherally calcified splenic artery aneurysm. IMPRESSION: No evidence of bowel obstruction or other acute process. Stable splenic artery aneurysm. Electronically Signed   By: Richardean Sale M.D.   On: 02/07/2022 17:50    Microbiology: Recent Results (from the past 240 hour(s))  C Difficile Quick Screen w PCR reflex     Status: Abnormal   Collection Time: 02/22/22  8:38 AM   Specimen: STOOL  Result Value Ref Range Status   C Diff antigen POSITIVE (A) NEGATIVE Final   C Diff toxin NEGATIVE NEGATIVE Final   C Diff interpretation Results are indeterminate. See PCR results.  Final    Comment: Performed at [facility], [place] [address], [place], [place] [address]  C. Diff by PCR, Reflexed     Status: Abnormal   Collection Time: 02/22/22  8:38 AM  Result Value Ref Range  Status   Toxigenic C. Difficile by PCR POSITIVE (A) NEGATIVE Final    Comment: Positive for toxigenic C. difficile with little to no toxin production. Only treat if clinical presentation suggests symptomatic illness. Performed at [facility], [place] [address] [place], [place] [address]      Labs: Basic Metabolic Panel: Recent Labs  Lab 02/22/22 1026 02/23/22 0834 02/24/22 0108 02/25/22 0108 02/26/22 0107 02/27/22 0143  NA 130* 132* 129* 130* 130* 129*  K 3.3* 4.3 4.1 4.3 4.1 4.3  CL 97* 103 100 98 98 99  CO2 21* 20* 20* 21* 25 22  GLUCOSE 85 102* 147* 132* 146* 121*  BUN '18 17 15 15 19 20  '$ CREATININE 0.69 0.78 0.61 0.53 0.59 0.43*  CALCIUM 8.9 9.0 9.0 9.8 9.6 9.1  MG 2.2 1.8  --  1.8  --   --    Liver Function Tests: Recent Labs  Lab 02/21/22 1541 02/23/22 0834 02/26/22 0107  AST '25 23 15  '$ ALT '20 20 19  '$ ALKPHOS 73 78 90  BILITOT 0.9 1.0 0.4  PROT 5.6* 5.5* 5.4*  ALBUMIN 2.8* 2.6* 2.2*   No results for input(s): "LIPASE", "AMYLASE" in the last 168 hours. No results for input(s): "AMMONIA" in the last 168 hours. CBC: Recent Labs  Lab 02/23/22 0834 02/24/22 0108 02/25/22 0108 02/26/22 0107 02/27/22 0143  WBC 5.4 7.1 11.3* 10.3 10.2  NEUTROABS 3.4 4.2 9.6* 8.0* 7.7  HGB 11.7* 11.1* 11.7* 11.9* 11.7*  HCT 35.1* 32.4* 34.1* 35.3* 34.1*  MCV 91.9 90.0 90.0 89.1 89.0   PLT 249 241 274 309 312   Cardiac Enzymes: No results for input(s): "CKTOTAL", "CKMB", "CKMBINDEX", "TROPONINI" in the last 168 hours. BNP: BNP (last 3 results) Recent Labs    12/24/21 0323  BNP 276.8*    ProBNP (last 3 results) Recent Labs    01/26/22 1447  PROBNP 68.0    CBG: No results for input(s): "GLUCAP" in the last 168 hours.     Signed:  Nita Sells MD   [facility] 02/27/2022, 11:24 AM

## 2022-02-27 NOTE — Progress Notes (Addendum)
Occupational Therapy Treatment Patient Details Name: Julie Montgomery MRN: [id number] DOB: [birth year] Today's Date: 02/27/2022   History of present illness Patient is an 86 yo female presenting to the ED with diarrhea on 02/22/22. Recent admissions in 8/23, 9/23, for C-diff other admission from 10/10-10/16/23 with same symptoms but C-diff negative. Found to be tachycardic in ED. Admitted with c-diff. PMH includes: TIA, afib on Eliquis   OT comments  Patient seated in recliner and agreeable to OT session.  Patient continues to be limited by impaired cognition, generalized weakness, decreased activity tolerance and impaired balance.  She demonstrates poor carryover during session with hand placement during transfers, recall of education provided during session; easily distracted and requires cueing for safety and problem solving.  She completes transfers with min assist, functional mobility using RW with min guard, and completes grooming standing at sink with min guard.  She requires max assist for LB dressing due to LB edema, and completes UB dressing with min assist.  Discussed with spouse recommendations for 24/7 support at dc, assist with meds and meals due to cognition- spouse verbalized understanding. Asked PT and mobility specialists to follow up with patient today prior to possible dc home. Will follow acutely.    Recommendations for follow up therapy are one component of a multi-disciplinary discharge planning process, led by the attending physician.  Recommendations may be updated based on patient status, additional functional criteria and insurance authorization.    Follow Up Recommendations  Home health OT (aide)    Assistance Recommended at Discharge Frequent or constant Supervision/Assistance  Patient can return home with the following  A little help with walking and/or transfers;A lot of help with bathing/dressing/bathroom;Assistance with cooking/housework;Direct supervision/assist for  medications management;Direct supervision/assist for financial management;Assist for transportation;Help with stairs or ramp for entrance   Equipment Recommendations  BSC/3in1    Recommendations for Other Services      Precautions / Restrictions Precautions Precautions: Fall Restrictions Weight Bearing Restrictions: No       Mobility Bed Mobility               General bed mobility comments: OOB in recliner upon entry    Transfers Overall transfer level: Needs assistance Equipment used: Rolling walker (2 wheels) Transfers: Sit to/from Stand Sit to Stand: Min assist           General transfer comment: min assist to power up and steady, cueing for hand placement     Balance Overall balance assessment: Needs assistance Sitting-balance support: Feet supported Sitting balance-Leahy Scale: Fair     Standing balance support: Bilateral upper extremity supported, During functional activity Standing balance-Leahy Scale: Poor Standing balance comment: relies on RW, min guard during ADLs without UE support                           ADL either performed or assessed with clinical judgement   ADL Overall ADL's : Needs assistance/impaired     Grooming: Min guard;Wash/dry face;Standing           Upper Body Dressing : Sitting;Minimal assistance   Lower Body Dressing: Sit to/from stand;Maximal assistance Lower Body Dressing Details (indicate cue type and reason): unable to reach feet today due to BLE edema, needs assist for socks; min assist in standing Toilet Transfer: Minimal assistance;Ambulation;Rolling walker (2 wheels) Toilet Transfer Details (indicate cue type and reason): simulated to recliner         Functional mobility during ADLs: Rolling walker (2  wheels);Minimal assistance      Extremity/Trunk Assessment              Vision       Perception     Praxis      Cognition Arousal/Alertness: Awake/alert Behavior During Therapy:  Flat affect Overall Cognitive Status: Impaired/Different from baseline Area of Impairment: Awareness, Safety/judgement, Following commands, Memory, Attention, Problem solving                   Current Attention Level: Sustained Memory: Decreased recall of precautions, Decreased short-term memory Following Commands: Follows one step commands consistently, Follows one step commands with increased time, Follows multi-step commands inconsistently Safety/Judgement: Decreased awareness of safety, Decreased awareness of deficits Awareness: Intellectual Problem Solving: Slow processing, Decreased initiation, Difficulty sequencing, Requires verbal cues, Requires tactile cues General Comments: pt with no recall of walking in hall with PT yesterday, poor recall of talking about this activity from beginning to end of session.  She follows simple 1 step commands with increased time but requires cueing for initation, sequencing and safety.  Poor carryover of task completion.        Exercises      Shoulder Instructions       General Comments Spoke to spouse about recommendations for 24/7 support, he voiced understanding.  He reports her memory is worse than it has been, but he has been managing her medications prior to admission.    Pertinent Vitals/ Pain       Pain Assessment Pain Assessment: Faces Faces Pain Scale: Hurts little more Pain Location: abdomen Pain Descriptors / Indicators: Discomfort, Grimacing, Guarding Pain Intervention(s): Limited activity within patient's tolerance, Monitored during session, Repositioned  Home Living                                          Prior Functioning/Environment              Frequency  Min 3X/week        Progress Toward Goals  OT Goals(current goals can now be found in the care plan section)  Progress towards OT goals: Progressing toward goals (slowly)  Acute Rehab OT Goals Patient Stated Goal: less pain OT  Goal Formulation: With patient Time For Goal Achievement: 03/08/22 Potential to Achieve Goals: Good  Plan Discharge plan remains appropriate;Frequency remains appropriate    Co-evaluation                 AM-PAC OT "6 Clicks" Daily Activity     Outcome Measure   Help from another person eating meals?: A Little Help from another person taking care of personal grooming?: A Little Help from another person toileting, which includes using toliet, bedpan, or urinal?: A Lot Help from another person bathing (including washing, rinsing, drying)?: A Lot Help from another person to put on and taking off regular upper body clothing?: A Little Help from another person to put on and taking off regular lower body clothing?: A Lot 6 Click Score: 15    End of Session Equipment Utilized During Treatment: Rolling walker (2 wheels);Gait belt  OT Visit Diagnosis: Unsteadiness on feet (R26.81);Muscle weakness (generalized) (M62.81)   Activity Tolerance Patient tolerated treatment well   Patient Left in chair;with call bell/phone within reach;with chair alarm set;with family/visitor present   Nurse Communication Mobility status        Time: 4174-0814 OT Time Calculation (min): 40  min  Charges: OT General Charges $OT Visit: 1 Visit OT Treatments $Self Care/Home Management : 38-52 mins  [place] [facility] [phone]   Delight Stare 02/27/2022, 11:47 AM

## 2022-02-27 NOTE — Progress Notes (Addendum)
Daily Rounding Note  02/27/2022, 10:03 AM  LOS: 5 days   SUBJECTIVE:   Chief complaint:   recurrent  c diff   Liquid brown stool x 1 this AM, no visible blood.  Pt c/O non-focal, abdominal pain.  No nausea.    OBJECTIVE:         Vital signs in last 24 hours:    Temp:  [97.5 F (36.4 C)-98.1 F (36.7 C)] 97.9 F (36.6 C) (10/31 0416) Pulse Rate:  [65-69] 69 (10/31 0416) Resp:  [18-23] 20 (10/31 0416) BP: (126-152)/(62-74) 152/74 (10/31 0416) SpO2:  [96 %-97 %] 97 % (10/31 0416) Last BM Date : 02/25/22 Filed Weights   02/21/22 1507  Weight: 41.3 kg   General: frail, cachectic, non-toxic   Heart: RRR Chest: clear bil.  No dyspnea Abdomen: mild to moderate distention, diffuse tenderness w/O guard/rebound.  BS normal  Extremities: thin, sarcompenia.   Neuro/Psych:  pleasant, calm.  Follows commands.    Intake/Output from previous day: No intake/output data recorded.  Intake/Output this shift: No intake/output data recorded.  Lab Results: Recent Labs    02/25/22 0108 02/26/22 0107 02/27/22 0143  WBC 11.3* 10.3 10.2  HGB 11.7* 11.9* 11.7*  HCT 34.1* 35.3* 34.1*  PLT 274 309 312   BMET Recent Labs    02/25/22 0108 02/26/22 0107 02/27/22 0143  NA 130* 130* 129*  K 4.3 4.1 4.3  CL 98 98 99  CO2 21* 25 22  GLUCOSE 132* 146* 121*  BUN '15 19 20  '$ CREATININE 0.53 0.59 0.43*  CALCIUM 9.8 9.6 9.1   LFT Recent Labs    02/26/22 0107  PROT 5.4*  ALBUMIN 2.2*  AST 15  ALT 19  ALKPHOS 90  BILITOT 0.4   PT/INR No results for input(s): "LABPROT", "INR" in the last 72 hours. Hepatitis Panel No results for input(s): "HEPBSAG", "HCVAB", "HEPAIGM", "HEPBIGM" in the last 72 hours.  Studies/Results: DG Abd Portable 1V  Result Date: 02/27/2022 CLINICAL DATA:  Ileus (Carbon) EXAM: PORTABLE ABDOMEN - 1 VIEW COMPARISON:  02/26/2022 FINDINGS: Persistent diffuse gaseous distension, measures up to 12.8 cm,  previously 12.6 cm, remeasured for consistency. No small bowel dilation. Stomach is likely decompressed. Somewhat featureless portions of the colon, unchanged. IMPRESSION: Persistent significant dilation of the colon, not significantly changed from prior. Electronically Signed   By: Maurine Simmering M.D.   On: 02/27/2022 08:47   DG Abd Portable 1V  Result Date: 02/26/2022 CLINICAL DATA:  Abdominal distention EXAM: PORTABLE ABDOMEN - 1 VIEW COMPARISON:  Previous studies including the examination of 02/24/2022 FINDINGS: There is gaseous distention of colon measuring up to 11.4 cm. There is interval increase in colonic distention. There is no small bowel dilation. Stomach is not distended. Gas is present in rectum. IMPRESSION: There is gaseous distention of colon with slight interval worsening. This may suggest ileus. There is no small bowel dilation. Electronically Signed   By: Elmer Picker M.D.   On: 02/26/2022 16:49    Scheduled Meds:  acidophilus  1 capsule Oral Daily   apixaban  2.5 mg Oral BID   diltiazem  120 mg Oral Daily   feeding supplement  1 Container Oral TID BM   feeding supplement  237 mL Oral BID BM   multivitamin with minerals  1 tablet Oral Daily   saccharomyces boulardii  250 mg Oral BID   sodium chloride flush  3 mL Intravenous Q12H   vancomycin  125 mg  Oral QID   Followed by   Derrill Memo ON 03/08/2022] vancomycin  125 mg Oral BID   Followed by   Derrill Memo ON 03/16/2022] vancomycin  125 mg Oral Daily   Followed by   Derrill Memo ON 03/23/2022] vancomycin  125 mg Oral QODAY   Followed by   Derrill Memo ON 04/20/2022] vancomycin  125 mg Oral Q3 days   Continuous Infusions:  lactated ringers 50 mL/hr at 02/27/22 0626   PRN Meds:.acetaminophen **OR** acetaminophen, hydrALAZINE, ondansetron, oxyCODONE   ASSESMENT:     Recurrent, relapsing C diff.   Now on pulsed vancomycin.  Dates as far back as 2016, 2018, but current infections first confirmed mid august 2023 following keflex Rxd by  derm.  Previous Rx w dificid, later entocort, pro-biotics (align, florastor).  CT w pst cholecystectomy mild intr/extrahepatic duct dilation, moderate and progressive dilation in prox to mid colon. Distal colon w inflammatory changes but no dilation.  Lat colonoscopy 03/2019: TA polyp, L diverticulosis Frequency of stools improved.  1 yesterday, 1 so far today.  Leukocytosis resolved    Hyponatremia.      Mild colonic ileus.      Afib, RVR.  Abixiban in place.      Severe malnutrition.  Soft diet in place as well as Boost tid.  Eating 30 to 40% of served meals.      PLAN     Continue pulsed vanc.  GI will recheck pt later this week.       Julie Montgomery  02/27/2022, 10:03 AM Phone [phone]    Attending physician's note   I have taken history, reviewed the chart and examined the patient. I performed a substantive portion of this encounter, including complete performance of at least one of the key components, in conjunction with the APP. I agree with the Advanced Practitioner's note, impression and recommendations.   Much better today.  No further diarrhea. Continue pulsed vancomycin She has chronic mild colonic distention.   FU as outpt GI in 4-6 weeks for consideration of fecal transplant/ Vowst  Will sign off for now.   Carmell Austria, MD [facility] [phone]

## 2022-02-27 NOTE — TOC Transition Note (Signed)
Transition of Care (TOC) - CM/SW Discharge Note Marvetta Gibbons RN, BSN Transitions of Care Unit 4E- RN Case Manager See Treatment Team for direct phone #   Patient Details  Name: LYLY CANIZALES MRN: [id number] Date of Birth: [birth year]  Transition of Care [facility] CM/SW Contact:  Dawayne Patricia, RN Phone Number: 02/27/2022, 12:23 PM   Clinical Narrative:    Pt stable for transition home today w/ spouse.  HHPT/OT orders and DME have been placed  CM in to speak with pt and spouse at bedside.  List provided for [facility] choice Per CMS guidelines from [email or web address] website with star ratings (copy placed in shadow chart)- after review spouse has selected [facility] as first choice. Discussed adding aide as well for bathing assistance.   DME also confirmed- RW and 3n1 ordered, spouse also to look for "step stool" for bed- discussed some places he might find one to purchase. Choice offered for RW and 3n1- spouse would like to use in house provider to have delivered to room prior to discharge.   Address, phone # and PCP all confirmed in epic, spouse to transport home later today.   Call made to [facility] for [facility] referral - referral has been accepted - HHPT/OT/aide.   DME referral for RW and BSC placed w/ [employer] in [place] system- once processed- DME to be delivered to room prior to discharge today  Pharmacy in to speak with pt and spouse regarding Vanc taper- copay cost per benefits check $5.   No further TOC needs noted   Final next level of care: Home w Home Health Services Barriers to Discharge: No Barriers Identified   Patient Goals and CMS Choice Patient states their goals for this hospitalization and ongoing recovery are:: return home CMS Medicare.gov Compare Post Acute Care list provided to:: Patient Choice offered to / list presented to : Spouse, Patient  Discharge Placement               Home w/ [facility]        Discharge Plan and Services   Discharge Planning  Services: CM Consult Post Acute Care Choice: Home Health, Durable Medical Equipment          DME Arranged: 3-N-1, Walker rolling DME Agency: [employer] Date DME Agency Contacted: 02/27/22 Time DME Agency Contacted: 1222 Representative spoke with at DME Agency: [place] system HH Arranged: PT, OT, Nurse's Aide [place] Agency: [place] Date [place]: 02/27/22 Time [place]: 1215 Representative spoke with at [place]: [place] ([place]) Interventions     Readmission Risk Interventions    02/27/2022   12:23 PM 02/12/2022    9:29 AM  Readmission Risk Prevention Plan  Transportation Screening Complete Complete  PCP or Specialist Appt within 3-5 Days Complete Complete  HRI or Home Care Consult Complete Complete  Social Work Consult for [place] Planning/Counseling Complete Complete  Palliative Care Screening Not Applicable Not Applicable  Medication Review Press photographer) Complete Complete

## 2022-02-27 NOTE — Discharge Instructions (Signed)
Julie Montgomery Vancomycin Calendar  02/27/22 - 03/08/22: Take 1 capsule four times daily  11/10- 11/16: take 1 capsule two times daily  11/17 - 11/23: take 1 capsule once daily     **Before 03/23/22, pick up prescription at [facility] **  03/23/22 - 04/19/22: take 1 capsule every other day  04/20/22- 05/20/22: take 1 capsule every three days

## 2022-02-28 ENCOUNTER — Other Ambulatory Visit (HOSPITAL_COMMUNITY): Payer: Self-pay

## 2022-02-28 DIAGNOSIS — Z8673 Personal history of transient ischemic attack (TIA), and cerebral infarction without residual deficits: Secondary | ICD-10-CM

## 2022-02-28 DIAGNOSIS — I2510 Atherosclerotic heart disease of native coronary artery without angina pectoris: Secondary | ICD-10-CM

## 2022-02-28 DIAGNOSIS — I480 Paroxysmal atrial fibrillation: Secondary | ICD-10-CM

## 2022-02-28 DIAGNOSIS — Z7901 Long term (current) use of anticoagulants: Secondary | ICD-10-CM

## 2022-02-28 DIAGNOSIS — Z79899 Other long term (current) drug therapy: Secondary | ICD-10-CM

## 2022-02-28 DIAGNOSIS — I4891 Unspecified atrial fibrillation: Secondary | ICD-10-CM

## 2022-02-28 DIAGNOSIS — I700 Atherosclerosis of aorta: Secondary | ICD-10-CM

## 2022-02-28 LAB — COMPREHENSIVE METABOLIC PANEL
ALT: 25 U/L (ref 0–44)
AST: 28 U/L (ref 15–41)
Albumin: 2.3 g/dL — ABNORMAL LOW (ref 3.5–5.0)
Alkaline Phosphatase: 92 U/L (ref 38–126)
Anion gap: 11 (ref 5–15)
BUN: 24 mg/dL — ABNORMAL HIGH (ref 8–23)
CO2: 25 mmol/L (ref 22–32)
Calcium: 9.3 mg/dL (ref 8.9–10.3)
Chloride: 93 mmol/L — ABNORMAL LOW (ref 98–111)
Creatinine, Ser: 0.65 mg/dL (ref 0.44–1.00)
GFR, Estimated: >60 mL/min (ref >60)
Glucose, Bld: 212 mg/dL — ABNORMAL HIGH (ref 70–99)
Potassium: 3.9 mmol/L (ref 3.5–5.1)
Sodium: 129 mmol/L — ABNORMAL LOW (ref 135–145)
Total Bilirubin: 0.3 mg/dL (ref 0.3–1.2)
Total Protein: 5.7 g/dL — ABNORMAL LOW (ref 6.5–8.1)

## 2022-02-28 LAB — CBC WITH DIFFERENTIAL/PLATELET
Abs Immature Granulocytes: 0.13 10*3/uL — ABNORMAL HIGH (ref 0.00–0.07)
Basophils Absolute: 0 10*3/uL (ref 0.0–0.1)
Basophils Relative: 0 %
Eosinophils Absolute: 0 10*3/uL (ref 0.0–0.5)
Eosinophils Relative: 0 %
HCT: 38.5 % (ref 36.0–46.0)
Hemoglobin: 13 g/dL (ref 12.0–15.0)
Immature Granulocytes: 1 %
Lymphocytes Relative: 6 %
Lymphs Abs: 0.7 10*3/uL (ref 0.7–4.0)
MCH: 30.5 pg (ref 26.0–34.0)
MCHC: 33.8 g/dL (ref 30.0–36.0)
MCV: 90.4 fL (ref 80.0–100.0)
Monocytes Absolute: 0.9 10*3/uL (ref 0.1–1.0)
Monocytes Relative: 8 %
Neutro Abs: 9.5 10*3/uL — ABNORMAL HIGH (ref 1.7–7.7)
Neutrophils Relative %: 85 %
Platelets: 304 10*3/uL (ref 150–400)
RBC: 4.26 MIL/uL (ref 3.87–5.11)
RDW: 15.5 % (ref 11.5–15.5)
WBC: 11.2 10*3/uL — ABNORMAL HIGH (ref 4.0–10.5)
nRBC: 0 % (ref 0.0–0.2)

## 2022-02-28 LAB — MAGNESIUM: Magnesium: 1.8 mg/dL (ref 1.7–2.4)

## 2022-02-28 LAB — TSH: TSH: 4.151 u[IU]/mL (ref 0.350–4.500)

## 2022-02-28 LAB — PHOSPHORUS: Phosphorus: 3.6 mg/dL (ref 2.5–4.6)

## 2022-02-28 MED ORDER — DILTIAZEM HCL ER COATED BEADS 180 MG PO CP24: 180.0000 mg | ORAL_CAPSULE | Freq: Every day | ORAL | Status: DC

## 2022-02-28 MED ORDER — SIMETHICONE 80 MG PO CHEW
80.0000 mg | CHEWABLE_TABLET | Freq: Four times a day (QID) | ORAL | Status: DC | PRN
  Administered 2022-02-28: 80 mg via ORAL
  Filled 2022-02-28: qty 1

## 2022-02-28 MED ORDER — DILTIAZEM HCL ER COATED BEADS 180 MG PO CP24
180.0000 mg | ORAL_CAPSULE | Freq: Every day | ORAL | 0 refills | Status: DC
  Filled 2022-02-28 – 2022-03-01 (×2): qty 30, 30d supply, fill #0

## 2022-02-28 MED ORDER — DILTIAZEM HCL 60 MG PO TABS
60.0000 mg | ORAL_TABLET | Freq: Once | ORAL | Status: AC
  Administered 2022-02-28: 60 mg via ORAL
  Filled 2022-02-28: qty 1

## 2022-02-28 MED ORDER — DILTIAZEM HCL ER COATED BEADS 180 MG PO CP24: 180.0000 mg | ORAL_CAPSULE | Freq: Every day | ORAL | 0 refills | Status: DC

## 2022-02-28 NOTE — Care Management Important Message (Signed)
Important Message  Patient Details  Name: Julie Montgomery MRN: [id number] Date of Birth: 03/29/1936   Medicare Important Message Given:  Yes     [name] 02/28/2022, 10:51 AM

## 2022-02-28 NOTE — Consult Note (Signed)
CARDIOLOGY CONSULT NOTE  Patient ID: Julie Montgomery MRN: [id number] DOB/AGE: [birth year] 86 y.o.  Admit date: 02/21/2022 Attending physician: Kerney Elbe, DO Primary Physician:  Cassandria Anger, MD Outpatient Cardiologist: Rex Kras, DO, [facility] Inpatient Cardiologist: Rex Kras, DO, [facility]  Reason of consultation: A-fib with RVR Referring physician: Kerney Elbe, DO  Chief complaint: Diarrhea  HPI:  Julie Montgomery is a 86 y.o. Caucasian female who presents with a chief complaint of " diarrhea." Her past medical history and cardiovascular risk factors include: Mild coronary artery calcification, history of recurrent C. difficile infection, Aortic Atherosclerosis (CT 03/2019), basal cell carcinoma, bronchiectasis, history of TIA.   Known history of recurrence C. difficile colitis and presents to the hospital with diarrhea.  She was managed by internal medicine and was scheduled to be discharged yesterday the patient was noted to be in A-fib with RVR.  Cardiology has been consulted during this hospitalization for management of A-fib with RVR.  She had a similar presentation in August 2023 when she presented with C. difficile colitis and went into A-fib with RVR.  At that time she responded well to parenteral medication and did not require cardioversion.  Since then she has been on AV nodal blocking agents for rate control strategy and oral anticoagulation for thromboembolic prophylaxis.  Patient was in the process of being discharged home yesterday but went into A-fib with RVR.  Therefore discharge was postponed and cardiology asked to weigh in.  Clinically patient is not the best historian.  She denies chest pain, shortness of breath, abdominal pain, near-syncope syncope.  Patient states that she defers her medication administration to either her husband or son on a regular basis.  But states that she has been compliant with the prescribed medical therapy.  She  converted to normal sinus rhythm earlier this morning she is asymptomatic at the time of this evaluation.  ALLERGIES: Allergies  Allergen Reactions   Lovastatin Other (See Comments)    Unknown reaction   Mometasone Furo-Formoterol Fum Other (See Comments)    Loss of appetite, laryngitis    Prozac [Fluoxetine Hcl] Other (See Comments)    Jumpy   Sulfa Antibiotics Other (See Comments)    Unknown reaction    PAST MEDICAL HISTORY: Past Medical History:  Diagnosis Date   Adenomatous polyp of colon 2020   Allergy    Anxiety    Basal cell carcinoma (BCC)    Cataract    Depression    Helicobacter pylori gastritis    Osteopenia    Pneumonia    hx of 03/2008   Stroke [facility])    TIA long time ago    PAST SURGICAL HISTORY: Past Surgical History:  Procedure Laterality Date   APPENDECTOMY     BREAST LUMPECTOMY     right   CATARACT EXTRACTION W/ INTRAOCULAR LENS  IMPLANT, BILATERAL Bilateral    CESAREAN SECTION     CHOLECYSTECTOMY     MOHS SURGERY     PARTIAL HYSTERECTOMY     ROTATOR CUFF REPAIR  2012   SKIN GRAFT     teeth remmoved      FAMILY HISTORY: The patient's family history includes Cancer in her brother and sister; Colon cancer in her son; Coronary artery disease in her brother; Dementia in her brother; Diabetes in her sister; Stomach cancer in her paternal grandmother.   SOCIAL HISTORY:  The patient  reports that she has never smoked. She has never used smokeless tobacco. She reports that she does  not drink alcohol and does not use drugs.  MEDICATIONS: Current Outpatient Medications  Medication Instructions   Align 4 mg, Oral, Daily   apixaban (ELIQUIS) 2.5 mg, Oral, 2 times daily   budesonide (ENTOCORT EC) 9 mg, Oral, Daily   dicyclomine (BENTYL) 10 mg, Oral, 3 times daily before meals   diltiazem (CARDIZEM CD) 120 mg, Oral, Daily   loperamide (IMODIUM) 2 MG capsule Oral, As needed   memantine (NAMENDA) 5 mg, Oral, 2 times daily   saccharomyces boulardii  (FLORASTOR) 250 mg, Oral, 2 times daily   vancomycin (VANCOCIN) 125 MG capsule 02/27/22 - 03/08/22: Take 1 capsule four times daily; 11/10- 11/16: take 1 capsule two times daily; 11/17 - 11/23: take 1 capsule once daily; from 11/24 and on, see other prescrption sent to [facility]   [START ON 03/23/2022] vancomycin (VANCOCIN) 125 MG capsule 03/23/22 - 04/19/22: take 1 capsule every other day; 04/20/22- 05/20/22: take 1 capsule every three days   VITAMIN D PO 1 tablet, Oral, Daily    REVIEW OF SYSTEMS: Review of Systems  Cardiovascular:  Negative for chest pain, claudication, dyspnea on exertion, irregular heartbeat, leg swelling, near-syncope, orthopnea, palpitations, paroxysmal nocturnal dyspnea and syncope.  Respiratory:  Negative for shortness of breath.   Hematologic/Lymphatic: Negative for bleeding problem.  Musculoskeletal:  Negative for muscle cramps and myalgias.  Neurological:  Negative for dizziness and light-headedness.  All other systems reviewed and are negative.   PHYSICAL EXAM:    02/28/2022   12:08 PM 02/28/2022   11:46 AM 02/28/2022    7:57 AM  Vitals with BMI  Systolic  532 992  Diastolic  72 72  Pulse 74 133 71     Intake/Output Summary (Last 24 hours) at 02/28/2022 1252 Last data filed at 02/27/2022 1600 Gross per 24 hour  Intake 2456.35 ml  Output --  Net 2456.35 ml    Net IO Since Admission: 8,165.18 mL [02/28/22 1252]  Physical examination: Patient refuses physical examination stating " you are not my doctor." [name] nurse also tried to convince her to be examined but she continued to refuse.  RADIOLOGY: DG Abd Portable 1V  Result Date: 02/27/2022 CLINICAL DATA:  Ileus (Aline) EXAM: PORTABLE ABDOMEN - 1 VIEW COMPARISON:  02/26/2022 FINDINGS: Persistent diffuse gaseous distension, measures up to 12.8 cm, previously 12.6 cm, remeasured for consistency. No small bowel dilation. Stomach is likely decompressed. Somewhat featureless portions of the colon, unchanged.  IMPRESSION: Persistent significant dilation of the colon, not significantly changed from prior. Electronically Signed   By: Maurine Simmering M.D.   On: 02/27/2022 08:47   DG Abd Portable 1V  Result Date: 02/26/2022 CLINICAL DATA:  Abdominal distention EXAM: PORTABLE ABDOMEN - 1 VIEW COMPARISON:  Previous studies including the examination of 02/24/2022 FINDINGS: There is gaseous distention of colon measuring up to 11.4 cm. There is interval increase in colonic distention. There is no small bowel dilation. Stomach is not distended. Gas is present in rectum. IMPRESSION: There is gaseous distention of colon with slight interval worsening. This may suggest ileus. There is no small bowel dilation. Electronically Signed   By: Elmer Picker M.D.   On: 02/26/2022 16:49    LABORATORY DATA: Lab Results  Component Value Date   WBC 11.2 (H) 02/28/2022   HGB 13.0 02/28/2022   HCT 38.5 02/28/2022   MCV 90.4 02/28/2022   PLT 304 02/28/2022    Recent Labs  Lab 02/28/22 1056  NA 129*  K 3.9  CL 93*  CO2 25  BUN 24*  CREATININE 0.65  CALCIUM 9.3  PROT 5.7*  BILITOT 0.3  ALKPHOS 92  ALT 25  AST 28  GLUCOSE 212*    Lipid Panel  Lab Results  Component Value Date   CHOL 156 12/15/2021   HDL 53 12/15/2021   LDLCALC 86 12/15/2021   LDLDIRECT 82 12/15/2021   TRIG 84 12/15/2021   CHOLHDL 2.9 12/15/2021    BNP (last 3 results) Recent Labs    12/24/21 0323  BNP 276.8*    HEMOGLOBIN A1C No results found for: "HGBA1C", "MPG"  Cardiac Panel (last 3 results) No results for input(s): "CKTOTAL", "CKMB", "TROPONINIHS", "RELINDX" in the last 72 hours.   TSH Recent Labs    11/14/21 1042 12/14/21 1700 01/26/22 1447  TSH 3.37 6.357* 3.61     CARDIAC DATABASE: EKG: 02/22/2022: A-fib with RVR, 197 bpm, ST-T changes likely secondary to rate related physiology, LVH per voltage criteria, but ischemia cannot be ruled out  Echocardiogram: 12/14/2021:  1. Left ventricular ejection fraction,  by estimation, is 55 to 60%. The  left ventricle has normal function. The left ventricle has no regional  wall motion abnormalities. Left ventricular diastolic function could not  be evaluated.   2. Right ventricular systolic function is low normal. The right  ventricular size is normal. There is normal pulmonary artery systolic  pressure. The estimated right ventricular systolic pressure is 16.1 mmHg.   3. The mitral valve is degenerative. Mild mitral valve regurgitation. No  evidence of mitral stenosis.   4. The aortic valve is grossly normal. Aortic valve regurgitation is not  visualized. Aortic valve sclerosis/calcification is present, without any  evidence of aortic stenosis.   5. The inferior vena cava is normal in size with greater than 50%  respiratory variability, suggesting right atrial pressure of 3 mmHg.   6. Rhythm strip during this exam demonstrates Afib RVR.  Calcium Score:  04/11/2018 Coronary calcium score of 16. This was 51 rd percentile for age and sex matched control.  IMPRESSION & RECOMMENDATIONS: Julie Montgomery is a 86 y.o. Caucasian female whose past medical history and cardiovascular risk factors include: Mild coronary artery calcification, history of recurrent C. difficile infection, Aortic Atherosclerosis (CT 03/2019), basal cell carcinoma, bronchiectasis, history of TIA.  Impression:  A-fib with RVR -now paroxysmal A-fib Recurrent C. difficile colitis Hyponatremia Long-term oral anticoagulation History of TIA Aortic atherosclerosis. Coronary artery calcification.   Plan:  Paroxysmal atrial fibrillation Rate control: Diltiazem. Rhythm control: N/A Thromboembolic prophylaxis: Eliquis CHA2DS2-VASc SCORE is 6 which correlates to 9.8% risk of stroke per year (age, aortic atherosclerosis history of TIA, gender).  Continue Eliquis 2.5 mg p.o. twice daily. Check TSH. Telemetry reviewed. Spontaneously converted to normal sinus rhythm early this  morning. Patient will be predisposed to A-fib with RVR in the setting of acute C. difficile colitis which leads to dehydration and intravascular depletion for the patient.  Would focus on treating the underlying cause to decrease the frequency of RVR episodes. Patient does not endorse evidence of bleeding.  Reemphasized the risks, benefits, and alternatives to anticoagulation.  Recurrent C. difficile colitis: Management per primary team and GI.  Hyponatremia: Management per primary team.  History of TIA: Reemphasized importance of improving her modifiable cardiovascular risk factors   Total encounter time 60 minutes. *Total Encounter Time as defined by the Centers for Medicare and Medicaid Services includes, in addition to the face-to-face time of a patient visit (documented in the note above) non-face-to-face time: obtaining and reviewing outside history,  ordering and reviewing medications, tests or procedures, care coordination (communications with other health care professionals or caregivers) and documentation in the medical record.  [name] questions and concerns were addressed to her satisfaction. She voices understanding of the instructions provided during this encounter.   This note was created using a voice recognition software as a result there may be grammatical errors inadvertently enclosed that do not reflect the nature of this encounter. Every attempt is made to correct such errors.  Mechele Claude [facility]  Pager: [phone] Office: [phone] 02/28/2022, 3:35 PM

## 2022-02-28 NOTE — Discharge Summary (Signed)
Physician Discharge Summary  ELLISE KOVACK BDZ:[id number] DOB: July 27, 1935 DOA: 02/21/2022  PCP: Cassandria Anger, MD  Admit date: 02/21/2022 Discharge date: 02/28/2022  Time spent: 36 minutes  Recommendations for Outpatient Follow-up:  Long vancomycin taper as per instructions ordered for patient Will need home health as well as rolling walker and bedside commode on discharge which were ordered We will attempt to facilitate outpatient referral to tertiary center through GI-we will need labs in about 1 week at PCP office to make sure still doing fair Follow-up with cardiology in outpatient setting within 1 to 2 weeks   Discharge Diagnoses:  MAIN problem for hospitalization   Recurrent C. difficile colitis improved Prior TIA Basal cell CA Known A-fib with RVR CHADVASC >5 Recurrent C. difficile with 6 episodes of recurrences  Please see below for itemized issues addressed in Hopsital- refer to other progress notes for clarity if needed  Disch regular arge Condition: Improved  Diet recommendation: Regular  Filed Weights   02/21/22 1507  Weight: 41.3 kg    History of present illness:  26 white female mild dementia/prior small TIA basal cell CA bronchiectasis known A-fib CHADVASC >5, sessile polyp on colonoscopy 03/2019 tubular adenoma, EGD 2020 small hiatal hernia followed by Dr. Fuller Plan with H. pylori Recurrent C. difficile X6 (2016 2018 11/2021) last admission 10/10 through 02/12/2022-also UTI at the time?  Initially treated with Dificid-ultimately felt to be postinfectious colitis + not C. difficile-Rx Entocort Bentyl probiotics and hospitalization complicated by A-fib RVR--- she was still taking Entocort probiotics align and Florastor On 10/24 developed large episodes of incontinence of watery stool with not really eating and drinking had abdominal tenderness additionally   C. difficile quick screen positive C. difficile antigen negative toxin PCR positive CT abdomen pelvis  progressive moderate dilatation proximal to mid colon mucosal hyperenhancement air-fluid levels   GI as well as ID saw the patient Developed persistent A-fib and was placed on Cardizem gtt.   10/28, pauses on monitors, developed severe abdominal pain repeat CT shows dilatation  ADDENDUM 02/28/22: Patient went back in A-fib with RVR today and so her diltiazem was increased to 180.  Cardiology evaluated and felt that her RVR was mediated by the C. difficile colitis.  She subsequently converted back to normal sinus rhythm and was deemed stable for discharge.  She continued to have some abdominal distention but this was chronic.  She will need to follow-up with PCP, cardiology as well as gastroenterology as she remains medically stable for discharge as her DME has been delivered.  Hospital Course:  Recurrent C. difficile third episode this year, antigen/PCR positive toxin negative--failed Dificid Prolonged vancomycin taper as per ID --WBC slightly down from prior "Vancomycin 125 mg PO qid for 14 days; then Vancomycin 125 mg PO bid for 7 days; then Vancomycin 125 mg PO daily for 7 days; then Vancomycin 125 mg PO every other day for 28 days; and then Vancomycin 125 mg PO every 3 days for 28 days" GI graduated diet to soft and she is tolerating this with less stool Since 10/29 Discussed her imaging findings and exam with gastroenterology and feel that this is not an ileus that she always has some dilatation and given her dementia it is not clear how much of her pain is chronic and how much she actually is experiencing as she cannot report clearly how many stools she has had what she is eating etc. As she looks nontoxic no white count and no other concerns both GI and  myself felt she is relatively stabilized for discharge-home with home health we will order walker etc.-GI to coordinate tertiary referral as an outpatient for fecal transplant  A-fib RVR on admission  Resolved Sinus pauses about 3-1/2 to 4  seconds Weaned Cardizem gtt. --Cut back Cardizem and changed to 120 CD  Continue apixaban 2.5 twice daily   Hypokalemia on admission now resolved Replaced with oral and IV and now overall seems to be resolved   Severe malnutrition Hold on placing any type of parenteral feeding device at this time Patient had swelling lower extremity likely secondary to poor appetite and malnutrition-I gave a dose of Lasix x1 Expect nutrition will improve as patient improves and she is eating more today   Discharge Exam: Vitals:   02/28/22 1146 02/28/22 1208  BP: 107/72   Pulse: (!) 133 74  Resp: 14 13  Temp: (!) 97.3 F (36.3 C)   SpO2: 98% 99%    Subj on day of d/c   Awake coherent x1 1 stool only today reported Ambulatory with some assistance and able to do some IADLs  General Exam on discharge  No icterus no pallor Chest clear no added sounds Abdomen soft although distended  Discharge Instructions   Discharge Instructions     Diet - low sodium heart healthy   Complete by: As directed    Discharge instructions   Complete by: As directed    Will need outpatient referral to tertiary center for fecal transplant as well as follow-up in the outpatient setting with GI--we will CC gastroenterology to ensure that they are aware Would recommend labs in about 1 week Please look at the dosing of the vancomycin carefully this will be called in for you and you need to take the entire course to prevent a recurrence-be very careful in the future taking any antibiotics from any other source and let them know your problems with recurrent C. difficile as this can cause this to recur We will get home health to come out and assist you and we will order rolling walker etc. I feel that the fluid that you have gained on the lower extremities is probably because of a nutritional component with low protein in your blood but also because we gave you some fluid and we will try to get some off of you before you  are discharged If you have high fevers chills and or more than 5-6 stools a day I would recommend you come back to the hospital   Increase activity slowly   Complete by: As directed       Allergies as of 02/28/2022       Reactions   Lovastatin Other (See Comments)   Unknown reaction   Mometasone Furo-formoterol Fum Other (See Comments)   Loss of appetite, laryngitis    Prozac [fluoxetine Hcl] Other (See Comments)   Jumpy   Sulfa Antibiotics Other (See Comments)   Unknown reaction        Medication List     STOP taking these medications    budesonide 3 MG 24 hr capsule Commonly known as: ENTOCORT EC   dicyclomine 10 MG capsule Commonly known as: BENTYL   loperamide 2 MG capsule Commonly known as: IMODIUM   VITAMIN D PO       TAKE these medications    Align 4 MG Caps Take 1 capsule (4 mg total) by mouth daily.   diltiazem 180 MG 24 hr capsule Commonly known as: CARDIZEM CD Take 1 capsule (180  mg total) by mouth daily. Start taking on: March 01, 2022   Eliquis 2.5 MG Tabs tablet Generic drug: apixaban Take 2.5 mg by mouth 2 (two) times daily.   memantine 5 MG tablet Commonly known as: NAMENDA Take 5 mg by mouth 2 (two) times daily.   saccharomyces boulardii 250 MG capsule Commonly known as: FLORASTOR Take 1 capsule (250 mg total) by mouth 2 (two) times daily.   vancomycin 125 MG capsule Commonly known as: VANCOCIN 02/27/22 - 03/08/22: Take 1 capsule four times daily; 11/10- 11/16: take 1 capsule two times daily; 11/17 - 11/23: take 1 capsule once daily; from 11/24 and on, see other prescrption sent to [facility]   vancomycin 125 MG capsule Commonly known as: VANCOCIN 03/23/22 - 04/19/22: take 1 capsule every other day; 04/20/22- 05/20/22: take 1 capsule every three days Start taking on: March 23, 2022               [place]  (From admission, onward)           Start     Ordered   02/27/22 1122  DME 3-in-1  Once         02/27/22 1124   02/27/22 1122  DME Walker  Once       Question Answer Comment  Walker: With [place]   Patient needs a walker to treat with the following condition Weak      02/27/22 1124           Allergies  Allergen Reactions   Lovastatin Other (See Comments)    Unknown reaction   Mometasone Furo-Formoterol Fum Other (See Comments)    Loss of appetite, laryngitis    Prozac [Fluoxetine Hcl] Other (See Comments)    Jumpy   Sulfa Antibiotics Other (See Comments)    Unknown reaction    Follow-up Information     [facility] [facility], FNP Follow up.   Specialty: Infectious Diseases Why: 2:30 pm on 11/8. Please call to reschedule if you are not able to make this appointment. Contact information: [address] [place] [address] [phone]         [facility] Follow up.   Why: (Adapt)- rolling walker and BSC arranged- to be delivered to room prior to discharge Contact information: [place] [address] [phone]         [facility] Follow up.   Specialty: [place] Why: HHPT/OT/aide arranged- they will contact you to schedule visits Contact information: [address] [place] [address] [phone]         Gatha Mayer, MD. Go in 2 week(s).   Specialty: Gastroenterology Contact information: [address] [place] [address] [phone]                  The results of significant diagnostics from this hospitalization (including imaging, microbiology, ancillary and laboratory) are listed below for reference.    Significant Diagnostic Studies: DG Abd Portable 1V  Result Date: 02/27/2022 CLINICAL DATA:  Ileus ([place]) EXAM: PORTABLE ABDOMEN - 1 VIEW COMPARISON:  02/26/2022 FINDINGS: Persistent diffuse gaseous distension, measures up to 12.8 cm, previously 12.6 cm, remeasured for consistency. No small bowel dilation. Stomach is likely decompressed.  Somewhat featureless portions of the colon, unchanged. IMPRESSION: Persistent significant dilation of the colon, not significantly changed from prior. Electronically Signed   By: Maurine Simmering M.D.   On: 02/27/2022 08:47  DG Abd Portable 1V  Result Date: 02/26/2022 CLINICAL DATA:  Abdominal distention EXAM: PORTABLE ABDOMEN - 1 VIEW COMPARISON:  Previous studies including the examination of 02/24/2022 FINDINGS: There is gaseous distention of colon measuring up to 11.4 cm. There is interval increase in colonic distention. There is no small bowel dilation. Stomach is not distended. Gas is present in rectum. IMPRESSION: There is gaseous distention of colon with slight interval worsening. This may suggest ileus. There is no small bowel dilation. Electronically Signed   By: Elmer Picker M.D.   On: 02/26/2022 16:49   CT ABDOMEN PELVIS W CONTRAST  Result Date: 02/24/2022 CLINICAL DATA:  Abdominal pain, acute, nonlocalized, C. difficile colitis with worsening abdominal pain. EXAM: CT ABDOMEN AND PELVIS WITH CONTRAST TECHNIQUE: Multidetector CT imaging of the abdomen and pelvis was performed using the standard protocol following bolus administration of intravenous contrast. RADIATION DOSE REDUCTION: This exam was performed according to the departmental dose-optimization program which includes automated exposure control, adjustment of the mA and/or kV according to patient size and/or use of iterative reconstruction technique. CONTRAST:  18m OMNIPAQUE IOHEXOL 350 MG/ML SOLN COMPARISON:  02/22/2022. FINDINGS: Lower chest: Airway wall thickening, peripheral mucous plugging, mild dependent atelectasis, similar to the prior study. Hepatobiliary: No focal liver abnormality is seen. Status post cholecystectomy. No biliary dilatation. Pancreas: Unremarkable. No pancreatic ductal dilatation or surrounding inflammatory changes. Spleen: Normal in size without focal abnormality. Adrenals/Urinary Tract: No adrenal mass.  Kidneys normal in size, orientation and position. 1.3 cm stable left midpole renal cyst. No follow-up recommended. No other masses, no stones and no hydronephrosis. Ureters normal in course and in caliber. Bladder is unremarkable. Stomach/Bowel: Dilated colon, predominantly the ascending and transverse, maximum 7.8 cm, increased compared to the most recent prior CT. There are colonic air-fluid levels. Distal colon remains decompressed. Wall is suboptimally assessed due to the lack of distension and contrast. Mild wall thickening suggested along the mid sigmoid similar to the prior study. No other areas of colonic wall thickening. Unremarkable stomach.  Small bowel is decompressed. Vascular/Lymphatic: Peripherally calcified splenic artery aneurysm, 1.5 cm, stable. Aortic atherosclerosis. No aortic aneurysm. No enlarged lymph nodes. Reproductive: Status post hysterectomy. No adnexal masses. Other: None. Musculoskeletal: No fracture or acute finding.  No bone lesion. IMPRESSION: 1. Colonic dilation, with associated air-fluid levels. Degree of colon dilation is increased from the previous CT. Mild wall thickening is again suggested along the distal colon similar to the prior study consistent with persistent C diff colitis. No new areas of bowel inflammation. 2. No other change from the prior exam.  No other acute abnormality. 3. Stable thrombosed splenic artery aneurysm. Aortic atherosclerosis. Electronically Signed   By: DLajean ManesM.D.   On: 02/24/2022 09:32   DG Abd 1 View  Result Date: 02/24/2022 CLINICAL DATA:  Abdominal pain EXAM: ABDOMEN - 1 VIEW COMPARISON:  CT 02/22/2022 FINDINGS: Gaseous distension of the colon up to the distal descending colon is again noted is again identified. The cecum which is in the midline of the abdomen measures 10.7 cm in diameter compared with 7.9 cm previously. No dilated small bowel loops identified. No signs of pneumatosis or pneumoperitoneum. IMPRESSION: 1. Persistent,  slightly progressive gaseous distension of the colon up to the distal descending colon. 2. No pneumoperitoneum or signs of pneumatosis. Electronically Signed   By: TKerby MoorsM.D.   On: 02/24/2022 06:19   CT ABDOMEN PELVIS W CONTRAST  Result Date: 02/22/2022 CLINICAL DATA:  Diffuse abdominal pain with diarrhea for several  days. EXAM: CT ABDOMEN AND PELVIS WITH CONTRAST TECHNIQUE: Multidetector CT imaging of the abdomen and pelvis was performed using the standard protocol following bolus administration of intravenous contrast. RADIATION DOSE REDUCTION: This exam was performed according to the departmental dose-optimization program which includes automated exposure control, adjustment of the mA and/or kV according to patient size and/or use of iterative reconstruction technique. CONTRAST:  50m OMNIPAQUE IOHEXOL 350 MG/ML SOLN COMPARISON:  Abdominopelvic CT 01/11/2022 and 12/24/2021. FINDINGS: Lower chest: Similar chronic central airway thickening and mucous plugging at both lung bases. No airspace disease or suspicious nodularity. No pleural or pericardial effusion. There is a small hiatal hernia. Hepatobiliary: The liver is normal in density without suspicious focal abnormality. Stable mild intra and extrahepatic biliary dilatation status post cholecystectomy, within physiologic limits. Pancreas: Unremarkable. No pancreatic ductal dilatation or surrounding inflammatory changes. Spleen: Normal in size without focal abnormality. Adrenals/Urinary Tract: Both adrenal glands appear normal. No evidence of urinary tract calculus, hydronephrosis or perinephric soft tissue stranding. A simple cyst in the interpolar region of the left kidney is stable, for which no follow-up imaging recommended. Anteriorly in the interpolar region of the right kidney, there is a possible new small area of cortical scarring or inflammation without associated perinephric fluid collection. The bladder is moderately distended without  focal abnormality. Stomach/Bowel: No enteric contrast administered. The stomach is decompressed. No small bowel distension, wall thickening or surrounding inflammation. Progressive moderate dilatation of the proximal to mid colon with mild wall thickening, mucosal hyperenhancement and scattered air-fluid levels. The cecum is located in the central upper abdomen. The distal colon is decompressed with persistent wall thickening and surrounding inflammation, remaining suspicious for diffuse colitis. No evidence of pneumatosis or perforation. Vascular/Lymphatic: There are no enlarged abdominal or pelvic lymph nodes. Aortic and branch vessel atherosclerosis without acute vascular findings. Unchanged chronically thrombosed splenic artery aneurysm measuring 1.5 cm. Reproductive: Hysterectomy.  No adnexal mass is identified. Other: No ascites, pneumoperitoneum or focal extraluminal fluid collections identified. Musculoskeletal: No acute or significant osseous findings. Lower lumbar facet arthropathy. IMPRESSION: 1. Persistent findings of diffuse colitis in this patient with a history of C difficile colitis. Progressive moderate dilatation of the proximal to mid colon without evidence of obstruction or perforation. 2. Possible new small area of cortical scarring or inflammation in the interpolar region of the right kidney. No evidence of urinary tract calculus or hydronephrosis. 3. Stable mild intra and extrahepatic biliary dilatation status post cholecystectomy, within physiologic limits. 4. Stable chronic central airway thickening and mucous plugging at both lung bases. 5. Stable chronically thrombosed splenic artery aneurysm. 6.  Aortic Atherosclerosis (ICD10-I70.0). Electronically Signed   By: WRichardean SaleM.D.   On: 02/22/2022 11:46   DG Abd 1 View  Result Date: 02/20/2022 CLINICAL DATA:  Lower abdominal distension.  Diarrhea EXAM: ABDOMEN - 1 VIEW COMPARISON:  Abdomen 02/07/2022 FINDINGS: Normal bowel gas  pattern. Gas in nondilated colon. Small amount of stool in the colon Surgical clips right upper quadrant. No urinary tract calculi. No acute skeletal abnormality. IMPRESSION: Negative. Electronically Signed   By: CFranchot GalloM.D.   On: 02/20/2022 13:49   DG Abd 2 Views  Result Date: 02/07/2022 CLINICAL DATA:  Abdominal pain. EXAM: ABDOMEN - 2 VIEW COMPARISON:  Acute abdominal series 03/22/2010. Abdominal CT 01/11/2022. FINDINGS: The bowel gas pattern is nonobstructive. There is no free intraperitoneal air or suspicious abdominal calcification. Cholecystectomy clips are noted. There is multifocal costal calcification. Grossly stable peripherally calcified splenic artery aneurysm. IMPRESSION: No evidence of  bowel obstruction or other acute process. Stable splenic artery aneurysm. Electronically Signed   By: Richardean Sale M.D.   On: 02/07/2022 17:50    Microbiology: Recent Results (from the past 240 hour(s))  C Difficile Quick Screen w PCR reflex     Status: Abnormal   Collection Time: 02/22/22  8:38 AM   Specimen: STOOL  Result Value Ref Range Status   C Diff antigen POSITIVE (A) NEGATIVE Final   C Diff toxin NEGATIVE NEGATIVE Final   C Diff interpretation Results are indeterminate. See PCR results.  Final    Comment: Performed at [facility], [place] [address] [place], [place] [address]  C. Diff by PCR, Reflexed     Status: Abnormal   Collection Time: 02/22/22  8:38 AM  Result Value Ref Range Status   Toxigenic C. Difficile by PCR POSITIVE (A) NEGATIVE Final    Comment: Positive for toxigenic C. difficile with little to no toxin production. Only treat if clinical presentation suggests symptomatic illness. Performed at [facility], [place] [address] [place], [place] [address]      Labs: Basic Metabolic Panel: Recent Labs  Lab 02/22/22 1026 02/23/22 0834 02/24/22 0108 02/25/22 0108 02/26/22 0107 02/27/22 0143 02/28/22 1056  NA 130* 132* 129* 130* 130* 129* 129*  K  3.3* 4.3 4.1 4.3 4.1 4.3 3.9  CL 97* 103 100 98 98 99 93*  CO2 21* 20* 20* 21* '25 22 25  '$ GLUCOSE 85 102* 147* 132* 146* 121* 212*  BUN '18 17 15 15 19 20 '$ 24*  CREATININE 0.69 0.78 0.61 0.53 0.59 0.43* 0.65  CALCIUM 8.9 9.0 9.0 9.8 9.6 9.1 9.3  MG 2.2 1.8  --  1.8  --   --  1.8  PHOS  --   --   --   --   --   --  3.6   Liver Function Tests: Recent Labs  Lab 02/23/22 0834 02/26/22 0107 02/28/22 1056  AST '23 15 28  '$ ALT '20 19 25  '$ ALKPHOS 78 90 92  BILITOT 1.0 0.4 0.3  PROT 5.5* 5.4* 5.7*  ALBUMIN 2.6* 2.2* 2.3*   No results for input(s): "LIPASE", "AMYLASE" in the last 168 hours. No results for input(s): "AMMONIA" in the last 168 hours. CBC: Recent Labs  Lab 02/24/22 0108 02/25/22 0108 02/26/22 0107 02/27/22 0143 02/28/22 1056  WBC 7.1 11.3* 10.3 10.2 11.2*  NEUTROABS 4.2 9.6* 8.0* 7.7 9.5*  HGB 11.1* 11.7* 11.9* 11.7* 13.0  HCT 32.4* 34.1* 35.3* 34.1* 38.5  MCV 90.0 90.0 89.1 89.0 90.4  PLT 241 274 309 312 304   Cardiac Enzymes: No results for input(s): "CKTOTAL", "CKMB", "CKMBINDEX", "TROPONINI" in the last 168 hours. BNP: BNP (last 3 results) Recent Labs    12/24/21 0323  BNP 276.8*    ProBNP (last 3 results) Recent Labs    01/26/22 1447  PROBNP 68.0    CBG: No results for input(s): "GLUCAP" in the last 168 hours.  Signed:  Raiford Noble DO   [facility] 02/28/2022, 6:58 PM

## 2022-02-28 NOTE — Progress Notes (Signed)
Occupational Therapy Treatment Patient Details Name: Julie Montgomery MRN: [id number] DOB: [birth year] Today's Date: 02/28/2022   History of present illness Patient is an 86 yo female presenting to the ED with diarrhea on 02/22/22. Recent admissions in 8/23, 9/23, for C-diff other admission from 10/10-10/16/23 with same symptoms but C-diff negative. Found to be tachycardic in ED. Admitted with c-diff. PMH includes: TIA, afib on Eliquis   OT comments  Patient seated in recliner, agreeable to OT session. Complains of stomach pain but denies need to use the bathroom.  Patient completing transfers with min assist from recliner, functional mobility to sink with min guard to min assist using RW for mgmt, safety and balance.  Standing at sink pt completing hand washing with min guard, incontinence of bowel in standing and positioned on BSC.  Max assist for toileting hygiene.  Pt following commands better today, improved attention and engagement but continue to recommend 24/7 assist at dc.  Will follow acutely.    Recommendations for follow up therapy are one component of a multi-disciplinary discharge planning process, led by the attending physician.  Recommendations may be updated based on patient status, additional functional criteria and insurance authorization.    Follow Up Recommendations  Home health OT (aide)    Assistance Recommended at Discharge Frequent or constant Supervision/Assistance  Patient can return home with the following  A little help with walking and/or transfers;A lot of help with bathing/dressing/bathroom;Assistance with cooking/housework;Direct supervision/assist for medications management;Direct supervision/assist for financial management;Assist for transportation;Help with stairs or ramp for entrance   Equipment Recommendations  BSC/3in1    Recommendations for Other Services      Precautions / Restrictions Precautions Precautions: Fall Restrictions Weight Bearing  Restrictions: No       Mobility Bed Mobility               General bed mobility comments: OOB in recliner upon entry    Transfers Overall transfer level: Needs assistance Equipment used: Rolling walker (2 wheels) Transfers: Sit to/from Stand Sit to Stand: Min assist, Min guard           General transfer comment: min assist to power up from recliner, min guard from [facility]; cueing for hand placement, safety and increased time for initation of task     Balance Overall balance assessment: Needs assistance Sitting-balance support: Feet supported Sitting balance-Leahy Scale: Fair     Standing balance support: Bilateral upper extremity supported, No upper extremity supported, During functional activity Standing balance-Leahy Scale: Poor Standing balance comment: relies on RW, min guard during ADLs without UE support                           ADL either performed or assessed with clinical judgement   ADL Overall ADL's : Needs assistance/impaired     Grooming: Min guard;Standing;Wash/dry Geophysical data processor Transfer: Minimal assistance;Ambulation;Rolling walker (2 wheels) Toilet Transfer Details (indicate cue type and reason): in room, to [place] Manipulation and Hygiene: Maximal assistance;Sit to/from stand Toileting - Clothing Manipulation Details (indicate cue type and reason): for hygiene     Functional mobility during ADLs: Rolling walker (2 wheels);Min guard General ADL Comments: pt with min cueing for safety, RW mgmt and poor awareness of incontience while standing at sink    Extremity/Trunk Assessment  Vision       Perception     Praxis      Cognition Arousal/Alertness: Awake/alert Behavior During Therapy: Flat affect Overall Cognitive Status: Impaired/Different from baseline Area of Impairment: Memory, Problem solving, Awareness, Following commands, Safety/judgement                    Current Attention Level: Sustained Memory: Decreased short-term memory, Decreased recall of precautions Following Commands: Follows one step commands consistently, Follows one step commands with increased time, Follows multi-step commands inconsistently Safety/Judgement: Decreased awareness of deficits, Decreased awareness of safety Awareness: Intellectual Problem Solving: Slow processing, Difficulty sequencing, Requires verbal cues General Comments: patient reports hospital ([facility] but able to correct to [facility] with min cueing), able to verbalize birthday and age turning this month; she follows simple commands with increased time but continues to demonstrate limited awareness to safety, deficits and assist needed. Incontinence of bowel standing at sink.        Exercises      Shoulder Instructions       General Comments      Pertinent Vitals/ Pain       Pain Assessment Pain Assessment: Faces Faces Pain Scale: Hurts little more Pain Location: abdomen Pain Descriptors / Indicators: Discomfort, Grimacing, Tightness Pain Intervention(s): Limited activity within patient's tolerance, Monitored during session, Repositioned  Home Living                                          Prior Functioning/Environment              Frequency  Min 3X/week        Progress Toward Goals  OT Goals(current goals can now be found in the care plan section)  Progress towards OT goals: Progressing toward goals  Acute Rehab OT Goals Patient Stated Goal: get better, less pain OT Goal Formulation: With patient Time For Goal Achievement: 03/08/22 Potential to Achieve Goals: Good  Plan Discharge plan remains appropriate;Frequency remains appropriate    Co-evaluation                 AM-PAC OT "6 Clicks" Daily Activity     Outcome Measure   Help from another person eating meals?: A Little Help from another person taking care of personal grooming?: A Little Help from  another person toileting, which includes using toliet, bedpan, or urinal?: A Lot Help from another person bathing (including washing, rinsing, drying)?: A Lot Help from another person to put on and taking off regular upper body clothing?: A Little Help from another person to put on and taking off regular lower body clothing?: A Lot 6 Click Score: 15    End of Session Equipment Utilized During Treatment: Rolling walker (2 wheels);Gait belt  OT Visit Diagnosis: Unsteadiness on feet (R26.81);Muscle weakness (generalized) (M62.81)   Activity Tolerance Patient tolerated treatment well   Patient Left in chair;with call bell/phone within reach;with chair alarm set   Nurse Communication Mobility status        Time: 2409-7353 OT Time Calculation (min): 35 min  Charges: OT General Charges $OT Visit: 1 Visit OT Treatments $Self Care/Home Management : 23-37 mins  [place] Office [phone]   Delight Stare 02/28/2022, 10:24 AM

## 2022-03-01 ENCOUNTER — Other Ambulatory Visit (HOSPITAL_COMMUNITY): Payer: Self-pay

## 2022-03-01 ENCOUNTER — Other Ambulatory Visit: Payer: Self-pay | Admitting: Internal Medicine

## 2022-03-01 ENCOUNTER — Telehealth: Payer: Self-pay

## 2022-03-01 MED ORDER — APIXABAN 2.5 MG PO TABS
2.5000 mg | ORAL_TABLET | Freq: Two times a day (BID) | ORAL | 5 refills | Status: AC
  Filled 2022-03-01 (×2): qty 60, 30d supply, fill #0

## 2022-03-01 NOTE — Telephone Encounter (Addendum)
Transition Care Management Follow-up Telephone Call Date of discharge and from where: [facility] 02-28-22 Dx: recurrent colitis due to C-Diff How have you been since you were released from the hospital? Feeling ok  Any questions or concerns? No  Items Reviewed: Did the pt receive and understand the discharge instructions provided? Yes  Medications obtained and verified? Yes  Other? No  Any new allergies since your discharge? No  Dietary orders reviewed? Yes Do you have support at home? Yes   Home Care and Equipment/Supplies: Were home health services ordered? yes If so, what is the name of the agency? [facility]   Has the agency set up a time to come to the patient's home? no Were any new equipment or medical supplies ordered?  Yes: RW/ BSC  What is the name of the medical supply agency? [facility]  Were you able to get the supplies/equipment? yes Do you have any questions related to the use of the equipment or supplies? No  Functional Questionnaire: (I = Independent and D = Dependent) ADLs: I- WITH ASSISTANCE   Bathing/Dressing- I- WITH ASSISTANCE   Meal Prep- D  Eating- I  Maintaining continence- I  Transferring/Ambulation- I-WALKER  Managing Meds- D  Follow up appointments reviewed:  PCP Hospital f/u appt confirmed? Yes  Scheduled to see Dr Alain Marion on 03-08-22 @ [facility] f/u appt confirmed? Yes  Scheduled to see Dr Elna Breslow on 03-07-22 @ 230pm. Are transportation arrangements needed? Yes  If their condition worsens, is the pt aware to call PCP or go to the Emergency Dept.? Yes Was the patient provided with contact information for the PCP's office or ED? Yes Was to pt encouraged to call back with questions or concerns? Yes   Juanda Crumble LPN [place] Direct Dial [phone]

## 2022-03-02 ENCOUNTER — Encounter: Payer: Self-pay | Admitting: Internal Medicine

## 2022-03-02 NOTE — Telephone Encounter (Signed)
Noted! Thank you

## 2022-03-03 DIAGNOSIS — I480 Paroxysmal atrial fibrillation: Secondary | ICD-10-CM | POA: Diagnosis not present

## 2022-03-03 DIAGNOSIS — I728 Aneurysm of other specified arteries: Secondary | ICD-10-CM | POA: Diagnosis not present

## 2022-03-03 DIAGNOSIS — Z7901 Long term (current) use of anticoagulants: Secondary | ICD-10-CM | POA: Diagnosis not present

## 2022-03-03 DIAGNOSIS — Z8673 Personal history of transient ischemic attack (TIA), and cerebral infarction without residual deficits: Secondary | ICD-10-CM | POA: Diagnosis not present

## 2022-03-03 DIAGNOSIS — I700 Atherosclerosis of aorta: Secondary | ICD-10-CM | POA: Diagnosis not present

## 2022-03-03 DIAGNOSIS — I2510 Atherosclerotic heart disease of native coronary artery without angina pectoris: Secondary | ICD-10-CM | POA: Diagnosis not present

## 2022-03-03 DIAGNOSIS — Z85828 Personal history of other malignant neoplasm of skin: Secondary | ICD-10-CM | POA: Diagnosis not present

## 2022-03-03 DIAGNOSIS — K449 Diaphragmatic hernia without obstruction or gangrene: Secondary | ICD-10-CM | POA: Diagnosis not present

## 2022-03-03 DIAGNOSIS — E43 Unspecified severe protein-calorie malnutrition: Secondary | ICD-10-CM | POA: Diagnosis not present

## 2022-03-03 DIAGNOSIS — F0390 Unspecified dementia without behavioral disturbance: Secondary | ICD-10-CM | POA: Diagnosis not present

## 2022-03-03 DIAGNOSIS — Z9181 History of falling: Secondary | ICD-10-CM | POA: Diagnosis not present

## 2022-03-03 DIAGNOSIS — A0471 Enterocolitis due to Clostridium difficile, recurrent: Secondary | ICD-10-CM | POA: Diagnosis not present

## 2022-03-05 ENCOUNTER — Other Ambulatory Visit: Payer: Self-pay

## 2022-03-05 ENCOUNTER — Inpatient Hospital Stay (HOSPITAL_COMMUNITY)
Admission: EM | Admit: 2022-03-05 | Discharge: 2022-03-30 | DRG: 329 | Disposition: A | Discharge status: Home Health | Payer: Medicare Other | Attending: Internal Medicine | Admitting: Internal Medicine

## 2022-03-05 ENCOUNTER — Telehealth: Payer: Self-pay | Admitting: Internal Medicine

## 2022-03-05 ENCOUNTER — Encounter (HOSPITAL_COMMUNITY): Payer: Self-pay

## 2022-03-05 ENCOUNTER — Emergency Department (HOSPITAL_COMMUNITY): Payer: Medicare Other

## 2022-03-05 ENCOUNTER — Telehealth: Payer: Medicare Other | Admitting: Physician Assistant

## 2022-03-05 DIAGNOSIS — K567 Ileus, unspecified: Secondary | ICD-10-CM | POA: Diagnosis not present

## 2022-03-05 DIAGNOSIS — E876 Hypokalemia: Secondary | ICD-10-CM | POA: Diagnosis not present

## 2022-03-05 DIAGNOSIS — Z91011 Allergy to milk products: Secondary | ICD-10-CM

## 2022-03-05 DIAGNOSIS — J90 Pleural effusion, not elsewhere classified: Secondary | ICD-10-CM | POA: Diagnosis not present

## 2022-03-05 DIAGNOSIS — Z7189 Other specified counseling: Secondary | ICD-10-CM

## 2022-03-05 DIAGNOSIS — R059 Cough, unspecified: Secondary | ICD-10-CM | POA: Diagnosis not present

## 2022-03-05 DIAGNOSIS — R0603 Acute respiratory distress: Secondary | ICD-10-CM | POA: Diagnosis not present

## 2022-03-05 DIAGNOSIS — Z888 Allergy status to other drugs, medicaments and biological substances status: Secondary | ICD-10-CM

## 2022-03-05 DIAGNOSIS — Z8249 Family history of ischemic heart disease and other diseases of the circulatory system: Secondary | ICD-10-CM

## 2022-03-05 DIAGNOSIS — R601 Generalized edema: Secondary | ICD-10-CM | POA: Diagnosis not present

## 2022-03-05 DIAGNOSIS — R109 Unspecified abdominal pain: Secondary | ICD-10-CM | POA: Diagnosis not present

## 2022-03-05 DIAGNOSIS — E43 Unspecified severe protein-calorie malnutrition: Secondary | ICD-10-CM | POA: Diagnosis present

## 2022-03-05 DIAGNOSIS — I340 Nonrheumatic mitral (valve) insufficiency: Secondary | ICD-10-CM | POA: Diagnosis not present

## 2022-03-05 DIAGNOSIS — K529 Noninfective gastroenteritis and colitis, unspecified: Secondary | ICD-10-CM | POA: Diagnosis not present

## 2022-03-05 DIAGNOSIS — M8580 Other specified disorders of bone density and structure, unspecified site: Secondary | ICD-10-CM | POA: Diagnosis present

## 2022-03-05 DIAGNOSIS — M7989 Other specified soft tissue disorders: Secondary | ICD-10-CM

## 2022-03-05 DIAGNOSIS — R140 Abdominal distension (gaseous): Secondary | ICD-10-CM | POA: Diagnosis not present

## 2022-03-05 DIAGNOSIS — E46 Unspecified protein-calorie malnutrition: Secondary | ICD-10-CM | POA: Diagnosis present

## 2022-03-05 DIAGNOSIS — Z85828 Personal history of other malignant neoplasm of skin: Secondary | ICD-10-CM

## 2022-03-05 DIAGNOSIS — I480 Paroxysmal atrial fibrillation: Secondary | ICD-10-CM | POA: Diagnosis not present

## 2022-03-05 DIAGNOSIS — Z8673 Personal history of transient ischemic attack (TIA), and cerebral infarction without residual deficits: Secondary | ICD-10-CM

## 2022-03-05 DIAGNOSIS — K659 Peritonitis, unspecified: Secondary | ICD-10-CM | POA: Diagnosis not present

## 2022-03-05 DIAGNOSIS — Z681 Body mass index (BMI) 19 or less, adult: Secondary | ICD-10-CM | POA: Diagnosis not present

## 2022-03-05 DIAGNOSIS — E8770 Fluid overload, unspecified: Secondary | ICD-10-CM | POA: Diagnosis not present

## 2022-03-05 DIAGNOSIS — K580 Irritable bowel syndrome with diarrhea: Secondary | ICD-10-CM | POA: Diagnosis not present

## 2022-03-05 DIAGNOSIS — I4821 Permanent atrial fibrillation: Secondary | ICD-10-CM | POA: Diagnosis present

## 2022-03-05 DIAGNOSIS — N179 Acute kidney failure, unspecified: Secondary | ICD-10-CM | POA: Diagnosis present

## 2022-03-05 DIAGNOSIS — Z820 Family history of epilepsy and other diseases of the nervous system: Secondary | ICD-10-CM

## 2022-03-05 DIAGNOSIS — F418 Other specified anxiety disorders: Secondary | ICD-10-CM | POA: Diagnosis not present

## 2022-03-05 DIAGNOSIS — F0390 Unspecified dementia without behavioral disturbance: Secondary | ICD-10-CM | POA: Diagnosis not present

## 2022-03-05 DIAGNOSIS — Z789 Other specified health status: Secondary | ICD-10-CM

## 2022-03-05 DIAGNOSIS — E440 Moderate protein-calorie malnutrition: Secondary | ICD-10-CM | POA: Diagnosis not present

## 2022-03-05 DIAGNOSIS — Z9181 History of falling: Secondary | ICD-10-CM | POA: Diagnosis not present

## 2022-03-05 DIAGNOSIS — F32A Depression, unspecified: Secondary | ICD-10-CM | POA: Diagnosis present

## 2022-03-05 DIAGNOSIS — Z98891 History of uterine scar from previous surgery: Secondary | ICD-10-CM

## 2022-03-05 DIAGNOSIS — Z515 Encounter for palliative care: Secondary | ICD-10-CM | POA: Diagnosis not present

## 2022-03-05 DIAGNOSIS — R531 Weakness: Principal | ICD-10-CM

## 2022-03-05 DIAGNOSIS — D638 Anemia in other chronic diseases classified elsewhere: Secondary | ICD-10-CM | POA: Diagnosis present

## 2022-03-05 DIAGNOSIS — Z86010 Personal history of colonic polyps: Secondary | ICD-10-CM

## 2022-03-05 DIAGNOSIS — Z8719 Personal history of other diseases of the digestive system: Secondary | ICD-10-CM | POA: Diagnosis not present

## 2022-03-05 DIAGNOSIS — Z9911 Dependence on respirator [ventilator] status: Secondary | ICD-10-CM | POA: Diagnosis not present

## 2022-03-05 DIAGNOSIS — Z221 Carrier of other intestinal infectious diseases: Secondary | ICD-10-CM

## 2022-03-05 DIAGNOSIS — R159 Full incontinence of feces: Secondary | ICD-10-CM | POA: Diagnosis present

## 2022-03-05 DIAGNOSIS — Z7901 Long term (current) use of anticoagulants: Secondary | ICD-10-CM | POA: Diagnosis not present

## 2022-03-05 DIAGNOSIS — Z781 Physical restraint status: Secondary | ICD-10-CM

## 2022-03-05 DIAGNOSIS — Y92234 Operating room of hospital as the place of occurrence of the external cause: Secondary | ICD-10-CM | POA: Diagnosis not present

## 2022-03-05 DIAGNOSIS — Y838 Other surgical procedures as the cause of abnormal reaction of the patient, or of later complication, without mention of misadventure at the time of the procedure: Secondary | ICD-10-CM | POA: Diagnosis not present

## 2022-03-05 DIAGNOSIS — E860 Dehydration: Secondary | ICD-10-CM | POA: Diagnosis not present

## 2022-03-05 DIAGNOSIS — E8809 Other disorders of plasma-protein metabolism, not elsewhere classified: Secondary | ICD-10-CM | POA: Diagnosis present

## 2022-03-05 DIAGNOSIS — F03A3 Unspecified dementia, mild, with mood disturbance: Secondary | ICD-10-CM | POA: Diagnosis present

## 2022-03-05 DIAGNOSIS — Z9049 Acquired absence of other specified parts of digestive tract: Secondary | ICD-10-CM | POA: Diagnosis not present

## 2022-03-05 DIAGNOSIS — J449 Chronic obstructive pulmonary disease, unspecified: Secondary | ICD-10-CM | POA: Diagnosis not present

## 2022-03-05 DIAGNOSIS — Z4682 Encounter for fitting and adjustment of non-vascular catheter: Secondary | ICD-10-CM | POA: Diagnosis not present

## 2022-03-05 DIAGNOSIS — K5720 Diverticulitis of large intestine with perforation and abscess without bleeding: Secondary | ICD-10-CM | POA: Diagnosis present

## 2022-03-05 DIAGNOSIS — K660 Peritoneal adhesions (postprocedural) (postinfection): Secondary | ICD-10-CM | POA: Diagnosis not present

## 2022-03-05 DIAGNOSIS — E871 Hypo-osmolality and hyponatremia: Secondary | ICD-10-CM | POA: Diagnosis not present

## 2022-03-05 DIAGNOSIS — I728 Aneurysm of other specified arteries: Secondary | ICD-10-CM | POA: Diagnosis not present

## 2022-03-05 DIAGNOSIS — E1165 Type 2 diabetes mellitus with hyperglycemia: Secondary | ICD-10-CM | POA: Diagnosis present

## 2022-03-05 DIAGNOSIS — R1084 Generalized abdominal pain: Secondary | ICD-10-CM | POA: Diagnosis not present

## 2022-03-05 DIAGNOSIS — Z801 Family history of malignant neoplasm of trachea, bronchus and lung: Secondary | ICD-10-CM

## 2022-03-05 DIAGNOSIS — K668 Other specified disorders of peritoneum: Secondary | ICD-10-CM | POA: Diagnosis not present

## 2022-03-05 DIAGNOSIS — R579 Shock, unspecified: Secondary | ICD-10-CM | POA: Diagnosis not present

## 2022-03-05 DIAGNOSIS — Z932 Ileostomy status: Secondary | ICD-10-CM

## 2022-03-05 DIAGNOSIS — K5939 Other megacolon: Secondary | ICD-10-CM | POA: Diagnosis not present

## 2022-03-05 DIAGNOSIS — R339 Retention of urine, unspecified: Secondary | ICD-10-CM | POA: Diagnosis not present

## 2022-03-05 DIAGNOSIS — I495 Sick sinus syndrome: Secondary | ICD-10-CM | POA: Diagnosis not present

## 2022-03-05 DIAGNOSIS — L89150 Pressure ulcer of sacral region, unstageable: Secondary | ICD-10-CM | POA: Diagnosis present

## 2022-03-05 DIAGNOSIS — N281 Cyst of kidney, acquired: Secondary | ICD-10-CM | POA: Diagnosis not present

## 2022-03-05 DIAGNOSIS — A0472 Enterocolitis due to Clostridium difficile, not specified as recurrent: Secondary | ICD-10-CM | POA: Diagnosis not present

## 2022-03-05 DIAGNOSIS — E861 Hypovolemia: Secondary | ICD-10-CM | POA: Diagnosis present

## 2022-03-05 DIAGNOSIS — R8281 Pyuria: Secondary | ICD-10-CM | POA: Diagnosis not present

## 2022-03-05 DIAGNOSIS — Z79899 Other long term (current) drug therapy: Secondary | ICD-10-CM

## 2022-03-05 DIAGNOSIS — S3669XA Other injury of rectum, initial encounter: Secondary | ICD-10-CM | POA: Diagnosis not present

## 2022-03-05 DIAGNOSIS — K3189 Other diseases of stomach and duodenum: Secondary | ICD-10-CM | POA: Diagnosis not present

## 2022-03-05 DIAGNOSIS — K560 Paralytic ileus: Secondary | ICD-10-CM | POA: Diagnosis not present

## 2022-03-05 DIAGNOSIS — R64 Cachexia: Secondary | ICD-10-CM | POA: Diagnosis not present

## 2022-03-05 DIAGNOSIS — Z9842 Cataract extraction status, left eye: Secondary | ICD-10-CM

## 2022-03-05 DIAGNOSIS — A498 Other bacterial infections of unspecified site: Secondary | ICD-10-CM | POA: Diagnosis present

## 2022-03-05 DIAGNOSIS — Z833 Family history of diabetes mellitus: Secondary | ICD-10-CM

## 2022-03-05 DIAGNOSIS — K449 Diaphragmatic hernia without obstruction or gangrene: Secondary | ICD-10-CM | POA: Diagnosis not present

## 2022-03-05 DIAGNOSIS — A0471 Enterocolitis due to Clostridium difficile, recurrent: Secondary | ICD-10-CM | POA: Diagnosis not present

## 2022-03-05 DIAGNOSIS — R609 Edema, unspecified: Secondary | ICD-10-CM | POA: Diagnosis not present

## 2022-03-05 DIAGNOSIS — R935 Abnormal findings on diagnostic imaging of other abdominal regions, including retroperitoneum: Secondary | ICD-10-CM | POA: Diagnosis not present

## 2022-03-05 DIAGNOSIS — I700 Atherosclerosis of aorta: Secondary | ICD-10-CM | POA: Diagnosis not present

## 2022-03-05 DIAGNOSIS — Z8709 Personal history of other diseases of the respiratory system: Secondary | ICD-10-CM

## 2022-03-05 DIAGNOSIS — I872 Venous insufficiency (chronic) (peripheral): Secondary | ICD-10-CM | POA: Diagnosis present

## 2022-03-05 DIAGNOSIS — F03A11 Unspecified dementia, mild, with agitation: Secondary | ICD-10-CM | POA: Diagnosis present

## 2022-03-05 DIAGNOSIS — K6389 Other specified diseases of intestine: Secondary | ICD-10-CM | POA: Diagnosis not present

## 2022-03-05 DIAGNOSIS — K5289 Other specified noninfective gastroenteritis and colitis: Secondary | ICD-10-CM | POA: Diagnosis not present

## 2022-03-05 DIAGNOSIS — S90822A Blister (nonthermal), left foot, initial encounter: Secondary | ICD-10-CM | POA: Diagnosis present

## 2022-03-05 DIAGNOSIS — Z9841 Cataract extraction status, right eye: Secondary | ICD-10-CM

## 2022-03-05 DIAGNOSIS — F419 Anxiety disorder, unspecified: Secondary | ICD-10-CM | POA: Diagnosis present

## 2022-03-05 DIAGNOSIS — R0602 Shortness of breath: Secondary | ICD-10-CM | POA: Diagnosis not present

## 2022-03-05 DIAGNOSIS — Z452 Encounter for adjustment and management of vascular access device: Secondary | ICD-10-CM | POA: Diagnosis not present

## 2022-03-05 DIAGNOSIS — I951 Orthostatic hypotension: Secondary | ICD-10-CM | POA: Diagnosis not present

## 2022-03-05 DIAGNOSIS — R9389 Abnormal findings on diagnostic imaging of other specified body structures: Secondary | ICD-10-CM | POA: Diagnosis not present

## 2022-03-05 DIAGNOSIS — I110 Hypertensive heart disease with heart failure: Secondary | ICD-10-CM | POA: Diagnosis present

## 2022-03-05 DIAGNOSIS — Z882 Allergy status to sulfonamides status: Secondary | ICD-10-CM

## 2022-03-05 DIAGNOSIS — I5033 Acute on chronic diastolic (congestive) heart failure: Secondary | ICD-10-CM | POA: Diagnosis not present

## 2022-03-05 DIAGNOSIS — R630 Anorexia: Secondary | ICD-10-CM | POA: Diagnosis not present

## 2022-03-05 DIAGNOSIS — Z8619 Personal history of other infectious and parasitic diseases: Secondary | ICD-10-CM

## 2022-03-05 DIAGNOSIS — J811 Chronic pulmonary edema: Secondary | ICD-10-CM | POA: Diagnosis not present

## 2022-03-05 DIAGNOSIS — R238 Other skin changes: Secondary | ICD-10-CM

## 2022-03-05 DIAGNOSIS — Z8701 Personal history of pneumonia (recurrent): Secondary | ICD-10-CM

## 2022-03-05 DIAGNOSIS — Y730 Diagnostic and monitoring gastroenterology and urology devices associated with adverse incidents: Secondary | ICD-10-CM | POA: Diagnosis not present

## 2022-03-05 DIAGNOSIS — I4891 Unspecified atrial fibrillation: Secondary | ICD-10-CM | POA: Diagnosis not present

## 2022-03-05 DIAGNOSIS — I2510 Atherosclerotic heart disease of native coronary artery without angina pectoris: Secondary | ICD-10-CM | POA: Diagnosis not present

## 2022-03-05 DIAGNOSIS — Z90711 Acquired absence of uterus with remaining cervical stump: Secondary | ICD-10-CM

## 2022-03-05 DIAGNOSIS — K6289 Other specified diseases of anus and rectum: Secondary | ICD-10-CM | POA: Diagnosis not present

## 2022-03-05 DIAGNOSIS — Z91010 Allergy to peanuts: Secondary | ICD-10-CM

## 2022-03-05 DIAGNOSIS — R627 Adult failure to thrive: Secondary | ICD-10-CM | POA: Diagnosis present

## 2022-03-05 DIAGNOSIS — Z961 Presence of intraocular lens: Secondary | ICD-10-CM | POA: Diagnosis present

## 2022-03-05 DIAGNOSIS — Z800 Family history of malignant neoplasm of digestive organs: Secondary | ICD-10-CM

## 2022-03-05 DIAGNOSIS — S90821A Blister (nonthermal), right foot, initial encounter: Secondary | ICD-10-CM | POA: Diagnosis present

## 2022-03-05 DIAGNOSIS — K5730 Diverticulosis of large intestine without perforation or abscess without bleeding: Secondary | ICD-10-CM | POA: Diagnosis not present

## 2022-03-05 HISTORY — DX: Paroxysmal atrial fibrillation: I48.0

## 2022-03-05 LAB — COMPREHENSIVE METABOLIC PANEL
ALT: 26 U/L (ref 0–44)
AST: 28 U/L (ref 15–41)
Albumin: 2.3 g/dL — ABNORMAL LOW (ref 3.5–5.0)
Alkaline Phosphatase: 74 U/L (ref 38–126)
Anion gap: 12 (ref 5–15)
BUN: 16 mg/dL (ref 8–23)
CO2: 23 mmol/L (ref 22–32)
Calcium: 9 mg/dL (ref 8.9–10.3)
Chloride: 94 mmol/L — ABNORMAL LOW (ref 98–111)
Creatinine, Ser: 0.49 mg/dL (ref 0.44–1.00)
GFR, Estimated: >60 mL/min (ref >60)
Glucose, Bld: 104 mg/dL — ABNORMAL HIGH (ref 70–99)
Potassium: 4.3 mmol/L (ref 3.5–5.1)
Sodium: 129 mmol/L — ABNORMAL LOW (ref 135–145)
Total Bilirubin: 0.5 mg/dL (ref 0.3–1.2)
Total Protein: 5.3 g/dL — ABNORMAL LOW (ref 6.5–8.1)

## 2022-03-05 LAB — CBC WITH DIFFERENTIAL/PLATELET
Abs Immature Granulocytes: 0.07 10*3/uL (ref 0.00–0.07)
Basophils Absolute: 0 10*3/uL (ref 0.0–0.1)
Basophils Relative: 0 %
Eosinophils Absolute: 0 10*3/uL (ref 0.0–0.5)
Eosinophils Relative: 0 %
HCT: 34 % — ABNORMAL LOW (ref 36.0–46.0)
Hemoglobin: 11.4 g/dL — ABNORMAL LOW (ref 12.0–15.0)
Immature Granulocytes: 1 %
Lymphocytes Relative: 6 %
Lymphs Abs: 0.9 10*3/uL (ref 0.7–4.0)
MCH: 30.2 pg (ref 26.0–34.0)
MCHC: 33.5 g/dL (ref 30.0–36.0)
MCV: 89.9 fL (ref 80.0–100.0)
Monocytes Absolute: 0.9 10*3/uL (ref 0.1–1.0)
Monocytes Relative: 7 %
Neutro Abs: 11.6 10*3/uL — ABNORMAL HIGH (ref 1.7–7.7)
Neutrophils Relative %: 86 %
Platelets: 356 10*3/uL (ref 150–400)
RBC: 3.78 MIL/uL — ABNORMAL LOW (ref 3.87–5.11)
RDW: 15.5 % (ref 11.5–15.5)
WBC: 13.5 10*3/uL — ABNORMAL HIGH (ref 4.0–10.5)
nRBC: 0 % (ref 0.0–0.2)

## 2022-03-05 LAB — LIPASE, BLOOD: Lipase: 45 U/L (ref 11–51)

## 2022-03-05 LAB — MAGNESIUM: Magnesium: 1.7 mg/dL (ref 1.7–2.4)

## 2022-03-05 LAB — PHOSPHORUS: Phosphorus: 2.7 mg/dL (ref 2.5–4.6)

## 2022-03-05 MED ORDER — ONDANSETRON 4 MG PO TBDP: 4.0000 mg | ORAL_TABLET | Freq: Once | ORAL | Status: DC

## 2022-03-05 MED ORDER — LACTATED RINGERS IV SOLN: INTRAVENOUS | Status: DC

## 2022-03-05 MED ORDER — LIDOCAINE 5 % EX PTCH
1.0000 | MEDICATED_PATCH | CUTANEOUS | Status: DC
  Filled 2022-03-05: qty 1

## 2022-03-05 MED ORDER — HYDROCODONE-ACETAMINOPHEN 5-325 MG PO TABS: 1.0000 | ORAL_TABLET | Freq: Once | ORAL | Status: DC

## 2022-03-05 MED ORDER — ACETAMINOPHEN 325 MG PO TABS
650.0000 mg | ORAL_TABLET | Freq: Four times a day (QID) | ORAL | Status: DC | PRN
  Administered 2022-03-08 – 2022-03-20 (×5): 650 mg via ORAL
  Filled 2022-03-05 (×5): qty 2

## 2022-03-05 MED ORDER — NALOXONE HCL 0.4 MG/ML IJ SOLN: 0.4000 mg | INTRAMUSCULAR | Status: DC | PRN

## 2022-03-05 MED ORDER — FENTANYL CITRATE PF 50 MCG/ML IJ SOSY
50.0000 ug | PREFILLED_SYRINGE | INTRAMUSCULAR | Status: DC | PRN
  Administered 2022-03-06 – 2022-03-21 (×10): 50 ug via INTRAVENOUS
  Filled 2022-03-05 (×12): qty 1

## 2022-03-05 MED ORDER — ACETAMINOPHEN 650 MG RE SUPP: 650.0000 mg | Freq: Four times a day (QID) | RECTAL | Status: DC | PRN

## 2022-03-05 MED ORDER — IOHEXOL 350 MG/ML SOLN
75.0000 mL | Freq: Once | INTRAVENOUS | Status: AC | PRN
  Administered 2022-03-05: 75 mL via INTRAVENOUS

## 2022-03-05 MED ORDER — ONDANSETRON HCL 4 MG/2ML IJ SOLN
4.0000 mg | Freq: Four times a day (QID) | INTRAMUSCULAR | Status: DC | PRN
  Administered 2022-03-23 – 2022-03-26 (×4): 4 mg via INTRAVENOUS
  Filled 2022-03-05 (×4): qty 2

## 2022-03-05 NOTE — Telephone Encounter (Signed)
Julie Montgomery did a home eval of the pt following her hospital visit from 02-21-22.   Julie Montgomery would like verbal orders for home ot visits for: 2X for 2 weeks 1X for 3 weeks.   Also wants orders for a Education officer, museum, pt's husband is having a hard time giving full care and needs assistance.   Please call Julie Montgomery to confirm:  [phone]

## 2022-03-05 NOTE — ED Provider Triage Note (Signed)
Emergency Medicine Provider Triage Evaluation Note  Julie Montgomery , a 86 y.o. female  was evaluated in triage.  Pt complains of swelling of abdomen, legs, feet that has been persistently swollen since hospitalization 1 week ago. Notes abdominal pain 2/2 to swelling  Review of Systems  Positive: Abdominal pain, nausea Negative: vomiting  Physical Exam  BP 127/67 (BP Location: Right Arm)   Pulse 70   Temp 98.8 F (37.1 C) (Oral)   Resp 16   Ht '5\' 2"'$  (1.575 m)   Wt 41.3 kg   SpO2 100%   BMI 16.64 kg/m  Gen:   Awake, no distress   Resp:  Normal effort  MSK:   Moves extremities without difficulty  Other:  BLE 1+, ascites  Medical Decision Making  Medically screening exam initiated at 3:47 PM.  Appropriate orders placed.  Julie Montgomery was informed that the remainder of the evaluation will be completed by another provider, this initial triage assessment does not replace that evaluation, and the importance of remaining in the ED until their evaluation is complete.    Osvaldo Shipper, [place] 03/05/22 1604

## 2022-03-05 NOTE — ED Notes (Signed)
Patient resting with family at bedside, no s/s of any distress. Patient able to follow verbal commands without issues. 10/10 pain to ABD. ABD tight and distended.

## 2022-03-05 NOTE — Telephone Encounter (Signed)
Spoke to [employer] to [employer] per [place]. He verbalized understanding and patient has an E-visit with PCP today.

## 2022-03-05 NOTE — ED Provider Notes (Signed)
[facility] EMERGENCY DEPARTMENT Provider Note   CSN: [id number] Arrival date & time: 03/05/22  1522     History  Chief Complaint  Patient presents with   Fluid Retention    Julie Montgomery is a 86 y.o. female.  86 year old female with prior medical history as detailed below presents for evaluation.  Patient with longstanding history of recurrent C. difficile.  Patient with recent admission for same.  [name] family returns with the patient to the ED today.  They are concerned about apparent increased abdominal pain.  They also are concerned about apparent increased abdominal distention.  [name] family is also reporting gradual increase in bilateral lower extremity edema.  Patient with decreased p.o. intake per husband's report.  Patient without fever.  Patient without vomiting.  [name] last BM was earlier today which apparently was formed and no diarrhea was noted.  The history is provided by the patient and medical records.       Home Medications Prior to Admission medications   Medication Sig Start Date End Date Taking? Authorizing Provider  apixaban (ELIQUIS) 2.5 MG TABS tablet Take 1 tablet (2.5 mg total) by mouth 2 (two) times daily. 03/01/22   Plotnikov, Evie Lacks, MD  diltiazem (CARDIZEM CD) 180 MG 24 hr capsule Take 1 capsule (180 mg total) by mouth daily. 03/01/22   Sheikh, Omair Latif, DO  memantine (NAMENDA) 5 MG tablet Take 5 mg by mouth 2 (two) times daily. 02/20/22   [provider]  Probiotic Product (ALIGN) 4 MG CAPS Take 1 capsule (4 mg total) by mouth daily. 12/26/21   Plotnikov, Evie Lacks, MD  saccharomyces boulardii (FLORASTOR) 250 MG capsule Take 1 capsule (250 mg total) by mouth 2 (two) times daily. 02/12/22 03/14/22  Caren Griffins, MD  vancomycin (VANCOCIN) 125 MG capsule 02/27/22 - 03/08/22: Take 1 capsule four times daily; 11/10- 11/16: take 1 capsule two times daily; 11/17 - 11/23: take 1 capsule once daily; from 11/24  and on, see other prescrption sent to [facility] 02/27/22   Nita Sells, MD  vancomycin (VANCOCIN) 125 MG capsule 03/23/22 - 04/19/22: take 1 capsule every other day; 04/20/22- 05/20/22: take 1 capsule every three days Patient not taking: Reported on 03/01/2022 03/23/22   Nita Sells, MD      Allergies    Lovastatin, Mometasone furo-formoterol fum, Prozac [fluoxetine hcl], and Sulfa antibiotics    Review of Systems   Review of Systems  All other systems reviewed and are negative.   Physical Exam Updated Vital Signs BP 124/69   Pulse 72   Temp 98.8 F (37.1 C) (Oral)   Resp 19   Ht '5\' 2"'$  (1.575 m)   Wt 41.3 kg   SpO2 98%   BMI 16.64 kg/m  Physical Exam Vitals and nursing note reviewed.  Constitutional:      General: She is not in acute distress.    Appearance: She is well-developed.     Comments: Alert, chronically ill in appearance, cachectic  HENT:     Head: Normocephalic and atraumatic.  Eyes:     Conjunctiva/sclera: Conjunctivae normal.     Pupils: Pupils are equal, round, and reactive to light.  Cardiovascular:     Rate and Rhythm: Normal rate and regular rhythm.     Heart sounds: Normal heart sounds.  Pulmonary:     Effort: Pulmonary effort is normal. No respiratory distress.     Breath sounds: Normal breath sounds.  Abdominal:     General: There is  distension.     Tenderness: There is no abdominal tenderness.     Comments: Moderately distended abdomen with mild diffuse tenderness.  Musculoskeletal:        General: No deformity. Normal range of motion.     Cervical back: Normal range of motion and neck supple.     Comments: 1-2+ bilateral lower extremity edema  Skin:    General: Skin is warm and dry.  Neurological:     General: No focal deficit present.     Mental Status: She is alert and oriented to person, place, and time.     ED Results / Procedures / Treatments   Labs (all labs ordered are listed, but only abnormal results are  displayed) Labs Reviewed  COMPREHENSIVE METABOLIC PANEL - Abnormal; Notable for the following components:      Result Value   Sodium 129 (*)    Chloride 94 (*)    Glucose, Bld 104 (*)    Total Protein 5.3 (*)    Albumin 2.3 (*)    All other components within normal limits  CBC WITH DIFFERENTIAL/PLATELET - Abnormal; Notable for the following components:   WBC 13.5 (*)    RBC 3.78 (*)    Hemoglobin 11.4 (*)    HCT 34.0 (*)    Neutro Abs 11.6 (*)    All other components within normal limits  LIPASE, BLOOD  URINALYSIS, ROUTINE W REFLEX MICROSCOPIC  MAGNESIUM  PHOSPHORUS  CBC WITH DIFFERENTIAL/PLATELET  COMPREHENSIVE METABOLIC PANEL  MAGNESIUM  I-STAT CHEM 8, ED    EKG None  Radiology CT ABDOMEN PELVIS W CONTRAST  Result Date: 03/05/2022 CLINICAL DATA:  Complains of abdominal swelling. EXAM: CT ABDOMEN AND PELVIS WITH CONTRAST TECHNIQUE: Multidetector CT imaging of the abdomen and pelvis was performed using the standard protocol following bolus administration of intravenous contrast. RADIATION DOSE REDUCTION: This exam was performed according to the departmental dose-optimization program which includes automated exposure control, adjustment of the mA and/or kV according to patient size and/or use of iterative reconstruction technique. CONTRAST:  96m OMNIPAQUE IOHEXOL 350 MG/ML SOLN COMPARISON:  02/24/2022 FINDINGS: Lower chest: Unchanged small left pleural effusion. Trace right effusion appears similar to previous exam. Peripheral tree-in-bud nodularity with areas of mucoid impaction again noted within both lung bases. Hepatobiliary: No suspicious liver lesion. Cholecystectomy. No biliary dilatation. Pancreas: Unremarkable. No pancreatic ductal dilatation or surrounding inflammatory changes. Spleen: Normal in size without focal abnormality. Adrenals/Urinary Tract: No adrenal mass. Simple appearing left kidney cyst measures 1.3 cm, image 30/3. No nephrolithiasis or hydronephrosis. Urinary  bladder is unremarkable. Stomach/Bowel: Small to moderate hiatal hernia. Increase caliber of the proximal small bowel loops within the left hemiabdomen which measure up to 2.9 cm, image 47/3. Fecalization of the prominent small bowel loops noted compatible with diminished bowel transit. There is persistent colonic distension. The cecum measures up to 9.3 cm compared with 7.8 cm previously. Colonic air-fluid levels are identified. Assessment for colonic wall thickening is decreased due to diffuse colonic distension. Transition to decreased caliber distal colon is noted at the level of the mid sigmoid colon in the left hemipelvis, image 64/3. Signs of pelvic floor laxity identified with evidence of small rectocele. No signs pneumatosis or bowel perforation. Vascular/Lymphatic: Aortic atherosclerosis. Unchanged splenic artery aneurysm measuring 1.5 cm, image 20/3. No signs of abdominopelvic adenopathy. Reproductive: Status post hysterectomy. No adnexal masses. Other: A trace amount of free fluid is noted within the dependent portion of the pelvis, image 69/3. No free air identified. Musculoskeletal: No acute  or significant osseous findings. There is diffuse subcutaneous edema involving the abdomen and visualized portions of the lower extremity. IMPRESSION: 1. Persistent and mildly progressive colonic distension with air-fluid levels. Transition to decreased caliber distal colon at the level of the mid sigmoid colon in the left hemipelvis. Increase caliber of the proximal small bowel loops within the left hemiabdomen which measure up to 2.9 cm. Fecalization of the prominent small bowel loops noted compatible with diminished bowel transit. Etiology of the persistent colonic distension is indeterminate. Cannot exclude toxic megacolon in this patient who has a reported history of C diff colitis. No signs of pneumatosis or bowel perforation at this time. 2. Small to moderate hiatal hernia. 3. Unchanged small left pleural  effusion. Trace right effusion appears similar to previous exam. 4. Peripheral tree-in-bud nodularity with areas of mucoid impaction again noted within both lung bases. Findings are favored to represent sequelae of chronic indolent atypical infection such as MAI. 5. Unchanged splenic artery aneurysm measuring 1.5 cm. 6. Signs of pelvic floor laxity with evidence of small rectocele. 7. Trace amount of free fluid is noted within the dependent portion of the pelvis. 8.  Aortic Atherosclerosis (ICD10-I70.0). Electronically Signed   By: Kerby Moors M.D.   On: 03/05/2022 19:32   DG Abdomen 1 View  Result Date: 03/05/2022 CLINICAL DATA:  Abdominal distention EXAM: ABDOMEN - 1 VIEW COMPARISON:  02/27/2022 FINDINGS: There is marked gaseous distention of right colon measuring up to 14.7 cm in diameter. Ascending colon measured 12.8 cm in the previous study. There is mild dilation of few small bowel loops measuring up to 3.5 cm. Stomach is not distended. There is lucency in the retrocardiac region, possibly suggesting presence of small hiatal hernia. IMPRESSION: There is marked dilation of ascending colon with interval worsening. There is mild dilation of small-bowel loops. Findings may suggest ileus. If there are continued symptoms, follow-up CT may be considered. Electronically Signed   By: Elmer Picker M.D.   On: 03/05/2022 17:08   DG Chest 2 View  Result Date: 03/05/2022 CLINICAL DATA:  Cough EXAM: CHEST - 2 VIEW COMPARISON:  01/26/2022 FINDINGS: Cardiac size is within normal limits. Low position of diaphragms suggests COPD. There are no signs of alveolar pulmonary edema or focal pulmonary consolidation. There is interval appearance of small bilateral pleural effusions. There is no pneumothorax. IMPRESSION: Small bilateral pleural effusions. COPD. There are no signs of pulmonary edema or focal pulmonary consolidation. Electronically Signed   By: Elmer Picker M.D.   On: 03/05/2022 17:05     Procedures Procedures    Medications Ordered in ED Medications  ondansetron (ZOFRAN-ODT) disintegrating tablet 4 mg (has no administration in time range)  acetaminophen (TYLENOL) tablet 650 mg (has no administration in time range)    Or  acetaminophen (TYLENOL) suppository 650 mg (has no administration in time range)  ondansetron (ZOFRAN) injection 4 mg (has no administration in time range)  naloxone [facility]) injection 0.4 mg (has no administration in time range)  fentaNYL (SUBLIMAZE) injection 50 mcg (has no administration in time range)  lactated ringers infusion (has no administration in time range)  iohexol (OMNIPAQUE) 350 MG/ML injection 75 mL (75 mLs Intravenous Contrast Given 03/05/22 1826)    ED Course/ Medical Decision Making/ A&P                           Medical Decision Making Amount and/or Complexity of Data Reviewed Labs: ordered.  Risk Decision regarding hospitalization.  Medical Screen Complete  This patient presented to the ED with complaint of abdominal pain, lower extremity edema, decreased p.o. intake.  This complaint involves an extensive number of treatment options. The initial differential diagnosis includes, but is not limited to, recurrent C. difficile infection, metabolic abnormality, dehydration, toxic megacolon, other significant intra-abdominal pathology, etc  This presentation is: Acute, Chronic, Self-Limited, Previously Undiagnosed, Uncertain Prognosis, Complicated, Systemic Symptoms, and Threat to Life/Bodily Function  With longstanding history of recurrent C. difficile.  Patient with recent admission for same.  Patient is returning now with apparent increased abdominal pain, increased abdominal distention, and decreased p.o. intake.  Presentation is concerning given patient's significant deconditioning and chronic illness.  Labs are remarkable for mildly increased WBC count.  Chronic hyponatremia noted.  Chronic deficiency of albumin  noted.  CT imaging is concerning giving apparent increasing colon distention and fecalization of small bowel.  Patient with likely benefit from admission for further work-up and treatment.  Case discussed with hospital service to evaluate for admission.  Additional history obtained:  Additional history obtained from Spouse External records from outside sources obtained and reviewed including prior ED visits and prior Inpatient records.    Lab Tests:  I ordered and personally interpreted labs.  The pertinent results include: BC, CMP, lipase, magnesium, phosphorus, UA   Imaging Studies ordered:  I ordered imaging studies including the abdomen pelvis, plain films of chest and abdomen I independently visualized and interpreted obtained imaging which showed colonic distention, decreased bowel transit, fecalization of small bowel I agree with the radiologist interpretation.   Cardiac Monitoring:  The patient was maintained on a cardiac monitor.  I personally viewed and interpreted the cardiac monitor which showed an underlying rhythm of: NSR   Problem List / ED Course:  Abdominal pain, malnutrition, hyponatremia, leukocytosis   Reevaluation:  After the interventions noted above, I reevaluated the patient and found that they have: stayed the same   Disposition:  After consideration of the diagnostic results and the patients response to treatment, I feel that the patent would benefit from admission.          Final Clinical Impression(s) / ED Diagnoses Final diagnoses:  Weakness  Generalized abdominal pain    Rx / DC Orders ED Discharge Orders     None         Valarie Merino, MD 03/05/22 2244

## 2022-03-05 NOTE — Progress Notes (Signed)
Because of recent hospitalization and currently on Vancomycin, I feel your condition warrants further evaluation and I recommend that you be seen in a face to face visit. I feel labs will be needed to evaluate kidney function and heart function to make sure she was not fluid overloaded in hospital stay.   NOTE: There will be NO CHARGE for this eVisit   If you are having a true medical emergency please call 911.      For an urgent face to face visit, [place] has seven urgent care centers for your convenience:     [place] [facility] [place] [facility] [place] Get Driving Directions [phone] [address] [place], [place] [address]    [place] [facility]) Get Driving Directions [phone] [place], [place] [address]  [place] [facility] [place] [facility]) Get Driving Directions [phone] [address] [place],  [place]  [address]  [place] [facility] [place] Get Driving Directions  [phone] [place],  [place] [address]   [facility]  Get Driving Directions [phone] [place], [place], [place] [address]   [place] [facility] [place] Get Driving Directions  [phone] [address] [place], [place] [address]   [place] [facility] [place] Get Driving Directions [phone] [address], [place], [place] [address]  Your MyChart E-visit questionnaire answers were reviewed by a board certified advanced clinical practitioner to complete your personal care plan based on your specific symptoms.  Thank you for using e-Visits.    I have spent 5 minutes in review of e-visit questionnaire, review and updating patient chart, medical decision making and response to patient.   Mar Daring, PA-C

## 2022-03-05 NOTE — H&P (Signed)
History and Physical    PLEASE NOTE THAT DRAGON DICTATION SOFTWARE WAS USED IN THE CONSTRUCTION OF THIS NOTE.   Julie Montgomery HXT:[id number] DOB: 08/08/1935 DOA: 03/05/2022  PCP: Cassandria Anger, MD *** Patient coming from: home ***  I have personally briefly reviewed patient's old medical records in [place]  Chief Complaint: ***  HPI: Julie Montgomery is a 86 y.o. female with medical history significant for *** who is admitted to [facility] on 03/05/2022 with *** after presenting from home*** to [facility] ED complaining of ***.    ***       ***   ED Course:  Vital signs in the ED were notable for the following: ***  Labs were notable for the following: ***  Imaging and additional notable ED work-up: ***  While in the ED, the following were administered: ***  Subsequently, the patient was admitted  ***  ***red    Review of Systems: As per HPI otherwise 10 point review of systems negative.   Past Medical History:  Diagnosis Date   Adenomatous polyp of colon 2020   Allergy    Anxiety    Basal cell carcinoma (BCC)    Cataract    Depression    Helicobacter pylori gastritis    Osteopenia    Pneumonia    hx of 03/2008   Stroke [facility])    TIA long time ago    Past Surgical History:  Procedure Laterality Date   APPENDECTOMY     BREAST LUMPECTOMY     right   CATARACT EXTRACTION W/ INTRAOCULAR LENS  IMPLANT, BILATERAL Bilateral    CESAREAN SECTION     CHOLECYSTECTOMY     MOHS SURGERY     PARTIAL HYSTERECTOMY     ROTATOR CUFF REPAIR  2012   SKIN GRAFT     teeth remmoved      Social History:  reports that she has never smoked. She has never used smokeless tobacco. She reports that she does not drink alcohol and does not use drugs.   Allergies  Allergen Reactions   Lovastatin Other (See Comments)    Unknown reaction   Mometasone Furo-Formoterol Fum Other (See Comments)    Loss of appetite, laryngitis    Prozac [Fluoxetine Hcl]  Other (See Comments)    Jumpy   Sulfa Antibiotics Other (See Comments)    Unknown reaction    Family History  Problem Relation Age of Onset   Diabetes Sister    Cancer Sister        lung ca   Coronary artery disease Brother    Dementia Brother    Cancer Brother        lung ca   Stomach cancer Paternal Grandmother    Colon cancer Son    Breast cancer Neg Hx    Esophageal cancer Neg Hx    Rectal cancer Neg Hx     Family history reviewed and not pertinent ***   Prior to Admission medications   Medication Sig Start Date End Date Taking? Authorizing Provider  apixaban (ELIQUIS) 2.5 MG TABS tablet Take 1 tablet (2.5 mg total) by mouth 2 (two) times daily. 03/01/22   Plotnikov, Evie Lacks, MD  diltiazem (CARDIZEM CD) 180 MG 24 hr capsule Take 1 capsule (180 mg total) by mouth daily. 03/01/22   Sheikh, Omair Latif, DO  memantine (NAMENDA) 5 MG tablet Take 5 mg by mouth 2 (two) times daily. 02/20/22   [provider]  Probiotic  Product (ALIGN) 4 MG CAPS Take 1 capsule (4 mg total) by mouth daily. 12/26/21   Plotnikov, Evie Lacks, MD  saccharomyces boulardii (FLORASTOR) 250 MG capsule Take 1 capsule (250 mg total) by mouth 2 (two) times daily. 02/12/22 03/14/22  Caren Griffins, MD  vancomycin (VANCOCIN) 125 MG capsule 02/27/22 - 03/08/22: Take 1 capsule four times daily; 11/10- 11/16: take 1 capsule two times daily; 11/17 - 11/23: take 1 capsule once daily; from 11/24 and on, see other prescrption sent to [facility] 02/27/22   Nita Sells, MD  vancomycin (VANCOCIN) 125 MG capsule 03/23/22 - 04/19/22: take 1 capsule every other day; 04/20/22- 05/20/22: take 1 capsule every three days Patient not taking: Reported on 03/01/2022 03/23/22   Nita Sells, MD     Objective    Physical Exam: Vitals:   03/05/22 1846 03/05/22 2111 03/05/22 2114 03/05/22 2130  BP: 125/68 128/64  124/69  Pulse: 80  75 72  Resp: '16  19 19  '$ Temp:      TempSrc:      SpO2: 100%  97% 98%  Weight:       Height:        General: appears to be stated age; alert, oriented Skin: warm, dry, no rash Head:  AT/[place] Mouth:  Oral mucosa membranes appear moist, normal dentition Neck: supple; trachea midline Heart:  RRR; did not appreciate any M/R/G Lungs: CTAB, did not appreciate any wheezes, rales, or rhonchi Abdomen: + BS; soft, ND, NT Vascular: 2+ pedal pulses b/l; 2+ radial pulses b/l Extremities: no peripheral edema, no muscle wasting Neuro: strength and sensation intact in upper and lower extremities b/l ***   *** Neuro: 5/5 strength of the proximal and distal flexors and extensors of the upper and lower extremities bilaterally; sensation intact in upper and lower extremities b/l; cranial nerves II through XII grossly intact; no pronator drift; no evidence suggestive of slurred speech, dysarthria, or facial droop; Normal muscle tone. No tremors.  *** Neuro: In the setting of the patient's current mental status and associated inability to follow instructions, unable to perform full neurologic exam at this time.  As such, assessment of strength, sensation, and cranial nerves is limited at this time. Patient noted to spontaneously move all 4 extremities. No tremors.  ***    Labs on Admission: I have personally reviewed following labs and imaging studies  CBC: Recent Labs  Lab 02/27/22 0143 02/28/22 1056 03/05/22 1607  WBC 10.2 11.2* 13.5*  NEUTROABS 7.7 9.5* 11.6*  HGB 11.7* 13.0 11.4*  HCT 34.1* 38.5 34.0*  MCV 89.0 90.4 89.9  PLT 312 304 425   Basic Metabolic Panel: Recent Labs  Lab 02/27/22 0143 02/28/22 1056 03/05/22 1607  NA 129* 129* 129*  K 4.3 3.9 4.3  CL 99 93* 94*  CO2 '22 25 23  '$ GLUCOSE 121* 212* 104*  BUN 20 24* 16  CREATININE 0.43* 0.65 0.49  CALCIUM 9.1 9.3 9.0  MG  --  1.8  --   PHOS  --  3.6  --    GFR: Estimated Creatinine Clearance: 34.7 mL/min (by C-G formula based on SCr of 0.49 mg/dL). Liver Function Tests: Recent Labs  Lab 02/28/22 1056  03/05/22 1607  AST 28 28  ALT 25 26  ALKPHOS 92 74  BILITOT 0.3 0.5  PROT 5.7* 5.3*  ALBUMIN 2.3* 2.3*   Recent Labs  Lab 03/05/22 1607  LIPASE 45   No results for input(s): "AMMONIA" in the last 168 hours. Coagulation Profile: No  results for input(s): "INR", "PROTIME" in the last 168 hours. Cardiac Enzymes: No results for input(s): "CKTOTAL", "CKMB", "CKMBINDEX", "TROPONINI" in the last 168 hours. BNP (last 3 results) Recent Labs    01/26/22 1447  PROBNP 68.0   HbA1C: No results for input(s): "HGBA1C" in the last 72 hours. CBG: No results for input(s): "GLUCAP" in the last 168 hours. Lipid Profile: No results for input(s): "CHOL", "HDL", "LDLCALC", "TRIG", "CHOLHDL", "LDLDIRECT" in the last 72 hours. Thyroid Function Tests: No results for input(s): "TSH", "T4TOTAL", "FREET4", "T3FREE", "THYROIDAB" in the last 72 hours. Anemia Panel: No results for input(s): "VITAMINB12", "FOLATE", "FERRITIN", "TIBC", "IRON", "RETICCTPCT" in the last 72 hours. Urine analysis:    Component Value Date/Time   COLORURINE YELLOW 02/21/2022 1541   APPEARANCEUR CLOUDY (A) 02/21/2022 1541   LABSPEC 1.023 02/21/2022 1541   PHURINE 5.0 02/21/2022 1541   GLUCOSEU NEGATIVE 02/21/2022 1541   GLUCOSEU NEGATIVE 04/05/2021 1236   HGBUR SMALL (A) 02/21/2022 1541   BILIRUBINUR NEGATIVE 02/21/2022 1541   KETONESUR 20 (A) 02/21/2022 1541   PROTEINUR NEGATIVE 02/21/2022 1541   UROBILINOGEN 0.2 04/05/2021 1236   NITRITE NEGATIVE 02/21/2022 1541   LEUKOCYTESUR MODERATE (A) 02/21/2022 1541    Radiological Exams on Admission: CT ABDOMEN PELVIS W CONTRAST  Result Date: 03/05/2022 CLINICAL DATA:  Complains of abdominal swelling. EXAM: CT ABDOMEN AND PELVIS WITH CONTRAST TECHNIQUE: Multidetector CT imaging of the abdomen and pelvis was performed using the standard protocol following bolus administration of intravenous contrast. RADIATION DOSE REDUCTION: This exam was performed according to the  departmental dose-optimization program which includes automated exposure control, adjustment of the mA and/or kV according to patient size and/or use of iterative reconstruction technique. CONTRAST:  41m OMNIPAQUE IOHEXOL 350 MG/ML SOLN COMPARISON:  02/24/2022 FINDINGS: Lower chest: Unchanged small left pleural effusion. Trace right effusion appears similar to previous exam. Peripheral tree-in-bud nodularity with areas of mucoid impaction again noted within both lung bases. Hepatobiliary: No suspicious liver lesion. Cholecystectomy. No biliary dilatation. Pancreas: Unremarkable. No pancreatic ductal dilatation or surrounding inflammatory changes. Spleen: Normal in size without focal abnormality. Adrenals/Urinary Tract: No adrenal mass. Simple appearing left kidney cyst measures 1.3 cm, image 30/3. No nephrolithiasis or hydronephrosis. Urinary bladder is unremarkable. Stomach/Bowel: Small to moderate hiatal hernia. Increase caliber of the proximal small bowel loops within the left hemiabdomen which measure up to 2.9 cm, image 47/3. Fecalization of the prominent small bowel loops noted compatible with diminished bowel transit. There is persistent colonic distension. The cecum measures up to 9.3 cm compared with 7.8 cm previously. Colonic air-fluid levels are identified. Assessment for colonic wall thickening is decreased due to diffuse colonic distension. Transition to decreased caliber distal colon is noted at the level of the mid sigmoid colon in the left hemipelvis, image 64/3. Signs of pelvic floor laxity identified with evidence of small rectocele. No signs pneumatosis or bowel perforation. Vascular/Lymphatic: Aortic atherosclerosis. Unchanged splenic artery aneurysm measuring 1.5 cm, image 20/3. No signs of abdominopelvic adenopathy. Reproductive: Status post hysterectomy. No adnexal masses. Other: A trace amount of free fluid is noted within the dependent portion of the pelvis, image 69/3. No free air  identified. Musculoskeletal: No acute or significant osseous findings. There is diffuse subcutaneous edema involving the abdomen and visualized portions of the lower extremity. IMPRESSION: 1. Persistent and mildly progressive colonic distension with air-fluid levels. Transition to decreased caliber distal colon at the level of the mid sigmoid colon in the left hemipelvis. Increase caliber of the proximal small bowel loops within the  left hemiabdomen which measure up to 2.9 cm. Fecalization of the prominent small bowel loops noted compatible with diminished bowel transit. Etiology of the persistent colonic distension is indeterminate. Cannot exclude toxic megacolon in this patient who has a reported history of C diff colitis. No signs of pneumatosis or bowel perforation at this time. 2. Small to moderate hiatal hernia. 3. Unchanged small left pleural effusion. Trace right effusion appears similar to previous exam. 4. Peripheral tree-in-bud nodularity with areas of mucoid impaction again noted within both lung bases. Findings are favored to represent sequelae of chronic indolent atypical infection such as MAI. 5. Unchanged splenic artery aneurysm measuring 1.5 cm. 6. Signs of pelvic floor laxity with evidence of small rectocele. 7. Trace amount of free fluid is noted within the dependent portion of the pelvis. 8.  Aortic Atherosclerosis (ICD10-I70.0). Electronically Signed   By: Kerby Moors M.D.   On: 03/05/2022 19:32   DG Abdomen 1 View  Result Date: 03/05/2022 CLINICAL DATA:  Abdominal distention EXAM: ABDOMEN - 1 VIEW COMPARISON:  02/27/2022 FINDINGS: There is marked gaseous distention of right colon measuring up to 14.7 cm in diameter. Ascending colon measured 12.8 cm in the previous study. There is mild dilation of few small bowel loops measuring up to 3.5 cm. Stomach is not distended. There is lucency in the retrocardiac region, possibly suggesting presence of small hiatal hernia. IMPRESSION: There is  marked dilation of ascending colon with interval worsening. There is mild dilation of small-bowel loops. Findings may suggest ileus. If there are continued symptoms, follow-up CT may be considered. Electronically Signed   By: Elmer Picker M.D.   On: 03/05/2022 17:08   DG Chest 2 View  Result Date: 03/05/2022 CLINICAL DATA:  Cough EXAM: CHEST - 2 VIEW COMPARISON:  01/26/2022 FINDINGS: Cardiac size is within normal limits. Low position of diaphragms suggests COPD. There are no signs of alveolar pulmonary edema or focal pulmonary consolidation. There is interval appearance of small bilateral pleural effusions. There is no pneumothorax. IMPRESSION: Small bilateral pleural effusions. COPD. There are no signs of pulmonary edema or focal pulmonary consolidation. Electronically Signed   By: Elmer Picker M.D.   On: 03/05/2022 17:05     EKG: Independently reviewed, with result as described above. ***   Assessment/Plan   Principal Problem:   Abdominal pain   ***       ***            ***             ***            ***            ***            ***           ***   ***  DVT prophylaxis: SCD's ***  Code Status: Full code*** Family Communication: none*** Disposition Plan: Per Rounding Team Consults called: none***;  Admission status: ***    PLEASE NOTE THAT DRAGON DICTATION SOFTWARE WAS USED IN THE CONSTRUCTION OF THIS NOTE.   [place] DO [facility]  From [place]   03/05/2022, 10:32 PM   ***

## 2022-03-05 NOTE — ED Triage Notes (Addendum)
PER EMS: pt has been having fluid retention from abdomen down to her feet ongoing since she was discharged from the hospital 2 weeks ago. She reports she was supposed to have fluid removed then but was d/c home instead. She presents with swelling to abdomen and legs. She reports she wasn't given any medication prescriptions either. Hx of CHF. Denies cp/sob. Reports abd and back pain.  BP- 128/62, HR-72, 98% RA, CBG-127

## 2022-03-05 NOTE — Telephone Encounter (Signed)
Patients son called and wanted to know if Dr Alain Marion had something earlier than 03/08/22 but there are no opening. He wants to know if Dr Alain Marion thinks she will be fine to wait until then or if she needs to go to Hospital or urgent care. Please call back at [phone].

## 2022-03-06 ENCOUNTER — Encounter (HOSPITAL_COMMUNITY): Payer: Self-pay | Admitting: Internal Medicine

## 2022-03-06 DIAGNOSIS — Z9181 History of falling: Secondary | ICD-10-CM | POA: Diagnosis not present

## 2022-03-06 DIAGNOSIS — Y838 Other surgical procedures as the cause of abnormal reaction of the patient, or of later complication, without mention of misadventure at the time of the procedure: Secondary | ICD-10-CM | POA: Diagnosis not present

## 2022-03-06 DIAGNOSIS — K659 Peritonitis, unspecified: Secondary | ICD-10-CM | POA: Diagnosis present

## 2022-03-06 DIAGNOSIS — J90 Pleural effusion, not elsewhere classified: Secondary | ICD-10-CM | POA: Diagnosis not present

## 2022-03-06 DIAGNOSIS — R140 Abdominal distension (gaseous): Secondary | ICD-10-CM | POA: Diagnosis not present

## 2022-03-06 DIAGNOSIS — Z85828 Personal history of other malignant neoplasm of skin: Secondary | ICD-10-CM | POA: Diagnosis not present

## 2022-03-06 DIAGNOSIS — D638 Anemia in other chronic diseases classified elsewhere: Secondary | ICD-10-CM | POA: Diagnosis present

## 2022-03-06 DIAGNOSIS — F32A Depression, unspecified: Secondary | ICD-10-CM | POA: Diagnosis present

## 2022-03-06 DIAGNOSIS — E860 Dehydration: Secondary | ICD-10-CM | POA: Diagnosis present

## 2022-03-06 DIAGNOSIS — Z8673 Personal history of transient ischemic attack (TIA), and cerebral infarction without residual deficits: Secondary | ICD-10-CM | POA: Diagnosis not present

## 2022-03-06 DIAGNOSIS — Z7189 Other specified counseling: Secondary | ICD-10-CM | POA: Diagnosis not present

## 2022-03-06 DIAGNOSIS — I110 Hypertensive heart disease with heart failure: Secondary | ICD-10-CM | POA: Diagnosis present

## 2022-03-06 DIAGNOSIS — R0603 Acute respiratory distress: Secondary | ICD-10-CM | POA: Diagnosis not present

## 2022-03-06 DIAGNOSIS — Z9049 Acquired absence of other specified parts of digestive tract: Secondary | ICD-10-CM | POA: Diagnosis not present

## 2022-03-06 DIAGNOSIS — E8809 Other disorders of plasma-protein metabolism, not elsewhere classified: Secondary | ICD-10-CM | POA: Diagnosis present

## 2022-03-06 DIAGNOSIS — E8770 Fluid overload, unspecified: Secondary | ICD-10-CM | POA: Diagnosis not present

## 2022-03-06 DIAGNOSIS — I340 Nonrheumatic mitral (valve) insufficiency: Secondary | ICD-10-CM | POA: Diagnosis not present

## 2022-03-06 DIAGNOSIS — E871 Hypo-osmolality and hyponatremia: Secondary | ICD-10-CM | POA: Diagnosis present

## 2022-03-06 DIAGNOSIS — K567 Ileus, unspecified: Secondary | ICD-10-CM | POA: Diagnosis present

## 2022-03-06 DIAGNOSIS — R579 Shock, unspecified: Secondary | ICD-10-CM | POA: Diagnosis not present

## 2022-03-06 DIAGNOSIS — A0472 Enterocolitis due to Clostridium difficile, not specified as recurrent: Secondary | ICD-10-CM | POA: Diagnosis not present

## 2022-03-06 DIAGNOSIS — Z515 Encounter for palliative care: Secondary | ICD-10-CM

## 2022-03-06 DIAGNOSIS — K6289 Other specified diseases of anus and rectum: Secondary | ICD-10-CM | POA: Diagnosis not present

## 2022-03-06 DIAGNOSIS — I700 Atherosclerosis of aorta: Secondary | ICD-10-CM | POA: Diagnosis not present

## 2022-03-06 DIAGNOSIS — Z9911 Dependence on respirator [ventilator] status: Secondary | ICD-10-CM | POA: Diagnosis not present

## 2022-03-06 DIAGNOSIS — F0390 Unspecified dementia without behavioral disturbance: Secondary | ICD-10-CM | POA: Diagnosis not present

## 2022-03-06 DIAGNOSIS — E46 Unspecified protein-calorie malnutrition: Secondary | ICD-10-CM | POA: Diagnosis not present

## 2022-03-06 DIAGNOSIS — K668 Other specified disorders of peritoneum: Secondary | ICD-10-CM | POA: Diagnosis not present

## 2022-03-06 DIAGNOSIS — K5939 Other megacolon: Secondary | ICD-10-CM | POA: Diagnosis not present

## 2022-03-06 DIAGNOSIS — E440 Moderate protein-calorie malnutrition: Secondary | ICD-10-CM | POA: Diagnosis not present

## 2022-03-06 DIAGNOSIS — R601 Generalized edema: Secondary | ICD-10-CM | POA: Diagnosis not present

## 2022-03-06 DIAGNOSIS — K660 Peritoneal adhesions (postprocedural) (postinfection): Secondary | ICD-10-CM | POA: Diagnosis not present

## 2022-03-06 DIAGNOSIS — I480 Paroxysmal atrial fibrillation: Secondary | ICD-10-CM | POA: Diagnosis not present

## 2022-03-06 DIAGNOSIS — I728 Aneurysm of other specified arteries: Secondary | ICD-10-CM | POA: Diagnosis not present

## 2022-03-06 DIAGNOSIS — I5033 Acute on chronic diastolic (congestive) heart failure: Secondary | ICD-10-CM | POA: Diagnosis present

## 2022-03-06 DIAGNOSIS — K6389 Other specified diseases of intestine: Secondary | ICD-10-CM | POA: Diagnosis not present

## 2022-03-06 DIAGNOSIS — Z8719 Personal history of other diseases of the digestive system: Secondary | ICD-10-CM | POA: Diagnosis not present

## 2022-03-06 DIAGNOSIS — N179 Acute kidney failure, unspecified: Secondary | ICD-10-CM | POA: Diagnosis present

## 2022-03-06 DIAGNOSIS — K5289 Other specified noninfective gastroenteritis and colitis: Secondary | ICD-10-CM | POA: Diagnosis not present

## 2022-03-06 DIAGNOSIS — R9389 Abnormal findings on diagnostic imaging of other specified body structures: Secondary | ICD-10-CM | POA: Diagnosis not present

## 2022-03-06 DIAGNOSIS — J811 Chronic pulmonary edema: Secondary | ICD-10-CM | POA: Diagnosis not present

## 2022-03-06 DIAGNOSIS — Z7901 Long term (current) use of anticoagulants: Secondary | ICD-10-CM | POA: Diagnosis not present

## 2022-03-06 DIAGNOSIS — L89150 Pressure ulcer of sacral region, unstageable: Secondary | ICD-10-CM | POA: Diagnosis present

## 2022-03-06 DIAGNOSIS — S3669XA Other injury of rectum, initial encounter: Secondary | ICD-10-CM | POA: Diagnosis not present

## 2022-03-06 DIAGNOSIS — K560 Paralytic ileus: Secondary | ICD-10-CM | POA: Diagnosis present

## 2022-03-06 DIAGNOSIS — E43 Unspecified severe protein-calorie malnutrition: Secondary | ICD-10-CM | POA: Diagnosis present

## 2022-03-06 DIAGNOSIS — E876 Hypokalemia: Secondary | ICD-10-CM | POA: Diagnosis not present

## 2022-03-06 DIAGNOSIS — Z4682 Encounter for fitting and adjustment of non-vascular catheter: Secondary | ICD-10-CM | POA: Diagnosis not present

## 2022-03-06 DIAGNOSIS — I2510 Atherosclerotic heart disease of native coronary artery without angina pectoris: Secondary | ICD-10-CM | POA: Diagnosis not present

## 2022-03-06 DIAGNOSIS — F418 Other specified anxiety disorders: Secondary | ICD-10-CM | POA: Diagnosis not present

## 2022-03-06 DIAGNOSIS — F03A3 Unspecified dementia, mild, with mood disturbance: Secondary | ICD-10-CM | POA: Diagnosis present

## 2022-03-06 DIAGNOSIS — K580 Irritable bowel syndrome with diarrhea: Secondary | ICD-10-CM | POA: Diagnosis not present

## 2022-03-06 DIAGNOSIS — R64 Cachexia: Secondary | ICD-10-CM | POA: Diagnosis not present

## 2022-03-06 DIAGNOSIS — Y92234 Operating room of hospital as the place of occurrence of the external cause: Secondary | ICD-10-CM | POA: Diagnosis not present

## 2022-03-06 DIAGNOSIS — I4821 Permanent atrial fibrillation: Secondary | ICD-10-CM | POA: Diagnosis present

## 2022-03-06 DIAGNOSIS — I4891 Unspecified atrial fibrillation: Secondary | ICD-10-CM | POA: Diagnosis not present

## 2022-03-06 DIAGNOSIS — F03A11 Unspecified dementia, mild, with agitation: Secondary | ICD-10-CM | POA: Diagnosis present

## 2022-03-06 DIAGNOSIS — A498 Other bacterial infections of unspecified site: Secondary | ICD-10-CM | POA: Diagnosis not present

## 2022-03-06 DIAGNOSIS — K5730 Diverticulosis of large intestine without perforation or abscess without bleeding: Secondary | ICD-10-CM | POA: Diagnosis not present

## 2022-03-06 DIAGNOSIS — K529 Noninfective gastroenteritis and colitis, unspecified: Secondary | ICD-10-CM | POA: Diagnosis not present

## 2022-03-06 DIAGNOSIS — I495 Sick sinus syndrome: Secondary | ICD-10-CM | POA: Diagnosis not present

## 2022-03-06 DIAGNOSIS — R935 Abnormal findings on diagnostic imaging of other abdominal regions, including retroperitoneum: Secondary | ICD-10-CM | POA: Diagnosis not present

## 2022-03-06 DIAGNOSIS — K449 Diaphragmatic hernia without obstruction or gangrene: Secondary | ICD-10-CM | POA: Diagnosis not present

## 2022-03-06 DIAGNOSIS — Y730 Diagnostic and monitoring gastroenterology and urology devices associated with adverse incidents: Secondary | ICD-10-CM | POA: Diagnosis not present

## 2022-03-06 DIAGNOSIS — R0602 Shortness of breath: Secondary | ICD-10-CM | POA: Diagnosis not present

## 2022-03-06 DIAGNOSIS — Z681 Body mass index (BMI) 19 or less, adult: Secondary | ICD-10-CM | POA: Diagnosis not present

## 2022-03-06 DIAGNOSIS — Z452 Encounter for adjustment and management of vascular access device: Secondary | ICD-10-CM | POA: Diagnosis not present

## 2022-03-06 DIAGNOSIS — A0471 Enterocolitis due to Clostridium difficile, recurrent: Secondary | ICD-10-CM | POA: Diagnosis present

## 2022-03-06 DIAGNOSIS — K3189 Other diseases of stomach and duodenum: Secondary | ICD-10-CM | POA: Diagnosis not present

## 2022-03-06 DIAGNOSIS — K5720 Diverticulitis of large intestine with perforation and abscess without bleeding: Secondary | ICD-10-CM | POA: Diagnosis present

## 2022-03-06 DIAGNOSIS — R109 Unspecified abdominal pain: Secondary | ICD-10-CM | POA: Diagnosis not present

## 2022-03-06 DIAGNOSIS — E1165 Type 2 diabetes mellitus with hyperglycemia: Secondary | ICD-10-CM | POA: Diagnosis present

## 2022-03-06 LAB — COMPREHENSIVE METABOLIC PANEL WITH GFR
ALT: 26 U/L (ref 0–44)
AST: 34 U/L (ref 15–41)
Albumin: 2 g/dL — ABNORMAL LOW (ref 3.5–5.0)
Alkaline Phosphatase: 68 U/L (ref 38–126)
Anion gap: 8 (ref 5–15)
BUN: 16 mg/dL (ref 8–23)
CO2: 24 mmol/L (ref 22–32)
Calcium: 8.5 mg/dL — ABNORMAL LOW (ref 8.9–10.3)
Chloride: 96 mmol/L — ABNORMAL LOW (ref 98–111)
Creatinine, Ser: 0.49 mg/dL (ref 0.44–1.00)
GFR, Estimated: >60 mL/min
Glucose, Bld: 101 mg/dL — ABNORMAL HIGH (ref 70–99)
Potassium: 4.2 mmol/L (ref 3.5–5.1)
Sodium: 128 mmol/L — ABNORMAL LOW (ref 135–145)
Total Bilirubin: 0.9 mg/dL (ref 0.3–1.2)
Total Protein: 4.9 g/dL — ABNORMAL LOW (ref 6.5–8.1)

## 2022-03-06 LAB — URINALYSIS, ROUTINE W REFLEX MICROSCOPIC
Bacteria, UA: NONE SEEN
Bilirubin Urine: NEGATIVE
Glucose, UA: NEGATIVE mg/dL
Hgb urine dipstick: NEGATIVE
Ketones, ur: 20 mg/dL — AB
Leukocytes,Ua: NEGATIVE
Nitrite: NEGATIVE
Protein, ur: NEGATIVE mg/dL
Specific Gravity, Urine: >1.046 — ABNORMAL HIGH (ref 1.005–1.030)
pH: 5 (ref 5.0–8.0)

## 2022-03-06 LAB — CBC WITH DIFFERENTIAL/PLATELET
Abs Immature Granulocytes: 0.06 10*3/uL (ref 0.00–0.07)
Basophils Absolute: 0 10*3/uL (ref 0.0–0.1)
Basophils Relative: 0 %
Eosinophils Absolute: 0.1 10*3/uL (ref 0.0–0.5)
Eosinophils Relative: 1 %
HCT: 33 % — ABNORMAL LOW (ref 36.0–46.0)
Hemoglobin: 11.3 g/dL — ABNORMAL LOW (ref 12.0–15.0)
Immature Granulocytes: 1 %
Lymphocytes Relative: 8 %
Lymphs Abs: 0.9 10*3/uL (ref 0.7–4.0)
MCH: 30.9 pg (ref 26.0–34.0)
MCHC: 34.2 g/dL (ref 30.0–36.0)
MCV: 90.2 fL (ref 80.0–100.0)
Monocytes Absolute: 1 10*3/uL (ref 0.1–1.0)
Monocytes Relative: 9 %
Neutro Abs: 8.9 10*3/uL — ABNORMAL HIGH (ref 1.7–7.7)
Neutrophils Relative %: 81 %
Platelets: 299 10*3/uL (ref 150–400)
RBC: 3.66 MIL/uL — ABNORMAL LOW (ref 3.87–5.11)
RDW: 15.6 % — ABNORMAL HIGH (ref 11.5–15.5)
WBC: 11 10*3/uL — ABNORMAL HIGH (ref 4.0–10.5)
nRBC: 0 % (ref 0.0–0.2)

## 2022-03-06 LAB — MAGNESIUM: Magnesium: 1.7 mg/dL (ref 1.7–2.4)

## 2022-03-06 MED ORDER — VANCOMYCIN HCL 125 MG PO CAPS: 125.0000 mg | ORAL_CAPSULE | Freq: Two times a day (BID) | ORAL | Status: DC

## 2022-03-06 MED ORDER — VANCOMYCIN HCL 125 MG PO CAPS: 125.0000 mg | ORAL_CAPSULE | Freq: Every day | ORAL | Status: DC

## 2022-03-06 MED ORDER — SACCHAROMYCES BOULARDII 250 MG PO CAPS
250.0000 mg | ORAL_CAPSULE | Freq: Two times a day (BID) | ORAL | Status: DC
  Administered 2022-03-06: 250 mg via ORAL
  Filled 2022-03-06 (×2): qty 1

## 2022-03-06 MED ORDER — VANCOMYCIN HCL 500 MG IV SOLR
500.0000 mg | Freq: Four times a day (QID) | Status: DC
  Filled 2022-03-06: qty 10

## 2022-03-06 MED ORDER — FAMOTIDINE IN NACL 20-0.9 MG/50ML-% IV SOLN
20.0000 mg | Freq: Every day | INTRAVENOUS | Status: DC
  Administered 2022-03-06 – 2022-03-07 (×2): 20 mg via INTRAVENOUS
  Filled 2022-03-06 (×3): qty 50

## 2022-03-06 MED ORDER — MEMANTINE HCL 10 MG PO TABS
5.0000 mg | ORAL_TABLET | Freq: Two times a day (BID) | ORAL | Status: DC
  Administered 2022-03-06: 5 mg via ORAL
  Filled 2022-03-06: qty 1

## 2022-03-06 MED ORDER — MAGNESIUM SULFATE 2 GM/50ML IV SOLN
2.0000 g | Freq: Once | INTRAVENOUS | Status: AC
  Administered 2022-03-06: 2 g via INTRAVENOUS
  Filled 2022-03-06: qty 50

## 2022-03-06 MED ORDER — DEXTROSE-NACL 5-0.9 % IV SOLN: INTRAVENOUS | Status: DC

## 2022-03-06 MED ORDER — DILTIAZEM HCL ER COATED BEADS 180 MG PO CP24
180.0000 mg | ORAL_CAPSULE | Freq: Every day | ORAL | Status: DC
  Administered 2022-03-06: 180 mg via ORAL
  Filled 2022-03-06: qty 1

## 2022-03-06 MED ORDER — VANCOMYCIN HCL 125 MG PO CAPS
125.0000 mg | ORAL_CAPSULE | Freq: Three times a day (TID) | ORAL | Status: DC
  Administered 2022-03-06: 125 mg via ORAL
  Filled 2022-03-06 (×4): qty 1

## 2022-03-06 MED ORDER — VANCOMYCIN HCL 125 MG PO CAPS
125.0000 mg | ORAL_CAPSULE | Freq: Four times a day (QID) | ORAL | Status: AC
  Administered 2022-03-06 – 2022-03-09 (×12): 125 mg via ORAL
  Filled 2022-03-06 (×14): qty 1

## 2022-03-06 MED ORDER — APIXABAN 2.5 MG PO TABS
2.5000 mg | ORAL_TABLET | Freq: Two times a day (BID) | ORAL | Status: DC
  Administered 2022-03-06: 2.5 mg via ORAL
  Filled 2022-03-06: qty 1

## 2022-03-06 MED ORDER — METRONIDAZOLE 500 MG/100ML IV SOLN
500.0000 mg | Freq: Three times a day (TID) | INTRAVENOUS | Status: DC
  Administered 2022-03-06 – 2022-03-08 (×6): 500 mg via INTRAVENOUS
  Filled 2022-03-06 (×7): qty 100

## 2022-03-06 MED ORDER — METOPROLOL TARTRATE 5 MG/5ML IV SOLN
5.0000 mg | INTRAVENOUS | Status: DC | PRN
  Administered 2022-03-16 – 2022-03-19 (×2): 5 mg via INTRAVENOUS
  Filled 2022-03-06 (×3): qty 5

## 2022-03-06 NOTE — Telephone Encounter (Signed)
Okay. Thank you.

## 2022-03-06 NOTE — Assessment & Plan Note (Addendum)
Hypovolemic hyponatremia.  Hypomagnesemia   Patient with hypovolemia, plan to continue IV fluids with isotonic saline and dextrose. Close blood pressure monitoring.  Add 2 g mag sulfate IV

## 2022-03-06 NOTE — ED Notes (Signed)
Nt asked pt for urin sample and pt stated that she hasn't been able to pee all day. Nurse notified.

## 2022-03-06 NOTE — Plan of Care (Signed)

## 2022-03-06 NOTE — Telephone Encounter (Signed)
If it is just a hospital follow-up I can see her on 11/9. If Julie Montgomery has an active problem I can try to work her in sooner. Thanks

## 2022-03-06 NOTE — ED Notes (Signed)
admitting at bedside.  

## 2022-03-06 NOTE — Consult Note (Signed)
[facility] Surgery Consult Note  Julie Montgomery [birth year]  [id number].    Requesting MD: Riccardo Dubin Arrient Chief Complaint/Reason for Consult: c diff colitis  HPI:  Julie Montgomery is a 86 y.o. female PMH paroxysmal atrial fibrillation on eliquis, prior TIA, prior history of H. pylori gastritis, and history of recurrent c diff colitis who was admitted to [facility] yesterday with dehydration and diminished oral intake. States that she has had intermittent nausea and vomiting as well as abdominal distension and pain. She has had multiple prior episodes of c diff colitis dating back to 2016, again in 2018, and then was admitted in August 2023 with acute C. difficile colitis after a course of Keflex. At that time she was treated with a 10-day course of Dificid. She initially had improvement but not total resolution in symptoms. She was readmitted 9/14 through 01/13/2022 with recurrent diarrhea.  At that time C. difficile antigen positive, PCR positive, toxin negative. CT of the abdomen/pelvis at that time that did show diffuse colonic inflammatory changes.  ID was consulted without admission and she was treated with another course of Dificid x10 days. Readmitted 02/07/2022 again with recurrent diarrhea, toxin negative, PCR postive.  She was briefly placed on Dificid and then that was discontinued when C. difficile studies were negative.  She was seen by GI in consultation and ultimately was put on a course of Entocort 9 mg daily with gradual taper to help with residual colitis. Tapered down to 6 mg then has reoccurence of diarrhea.  Readmitted again 02/22/2022 had new onset Afib. Quick screen C diff positive at that time, negative toxin, positive PCR. CT abdomen/pelvis showed progressive moderate dilation of the proximal to mid colon with mild wall thickening and mucosal hyperenhancement and scattered air-fluid levels in the cecum located in the central upper abdomen, distal colon decompressed but noted  persistent wall thickening and surrounding inflammation suspicious for diffuse colitis, no pneumatosis or perforation  Patient discharged with pulse vancomycin taper on 02/28/2022.  She returned to the ED yesterday with poor oral intake, but no further diarrhea in the last several days.  She is having significant distention which is what is causing her pain right now. CT abdomen/pelvis shows diffuse and persistent colonic distension with cecum measuring up to 9.3cm; no signs pneumatosis or bowel perforation. WBC 13.5>>11.  Patient was started on IV flagyl then PO vancomycin. Gastroenterology is following, general surgery also asked to see.  Abdominal surgical history: appendectomy, cholecystectomy, cesarean section x3, partial hysterectomy Nonsmoker Denies alcohol or illicit drug use Lives at home with her husband.  ROS: ROS  All systems reviewed and otherwise negative except for as above.  Family History  Problem Relation Age of Onset   Diabetes Sister    Cancer Sister        lung ca   Coronary artery disease Brother    Dementia Brother    Cancer Brother        lung ca   Stomach cancer Paternal Grandmother    Colon cancer Son    Breast cancer Neg Hx    Esophageal cancer Neg Hx    Rectal cancer Neg Hx     Past Medical History:  Diagnosis Date   Adenomatous polyp of colon 2020   Allergy    Anxiety    Basal cell carcinoma (BCC)    Cataract    Depression    Helicobacter pylori gastritis    Osteopenia    Paroxysmal atrial fibrillation (HCC)    Pneumonia  hx of 03/2008   [facility])    TIA long time ago    Past Surgical History:  Procedure Laterality Date   APPENDECTOMY     BREAST LUMPECTOMY     right   CATARACT EXTRACTION W/ INTRAOCULAR LENS  IMPLANT, BILATERAL Bilateral    CESAREAN SECTION     CHOLECYSTECTOMY     MOHS SURGERY     PARTIAL HYSTERECTOMY     ROTATOR CUFF REPAIR  2012   SKIN GRAFT     teeth remmoved      Social History:  reports that she  has never smoked. She has never used smokeless tobacco. She reports that she does not drink alcohol and does not use drugs.  Allergies:  Allergies  Allergen Reactions   Lovastatin Other (See Comments)    Unknown reaction   Mometasone Furo-Formoterol Fum Other (See Comments)    Loss of appetite, laryngitis    Prozac [Fluoxetine Hcl] Other (See Comments)    Jumpy   Sulfa Antibiotics Other (See Comments)    Unknown reaction    (Not in a hospital admission)   Prior to Admission medications   Medication Sig Start Date End Date Taking? Authorizing Provider  apixaban (ELIQUIS) 2.5 MG TABS tablet Take 1 tablet (2.5 mg total) by mouth 2 (two) times daily. 03/01/22   Plotnikov, Evie Lacks, MD  diltiazem (CARDIZEM CD) 180 MG 24 hr capsule Take 1 capsule (180 mg total) by mouth daily. 03/01/22   Sheikh, Omair Latif, DO  memantine (NAMENDA) 5 MG tablet Take 5 mg by mouth 2 (two) times daily. 02/20/22   [provider]  Probiotic Product (ALIGN) 4 MG CAPS Take 1 capsule (4 mg total) by mouth daily. 12/26/21   Plotnikov, Evie Lacks, MD  saccharomyces boulardii (FLORASTOR) 250 MG capsule Take 1 capsule (250 mg total) by mouth 2 (two) times daily. 02/12/22 03/14/22  Caren Griffins, MD  vancomycin (VANCOCIN) 125 MG capsule 02/27/22 - 03/08/22: Take 1 capsule four times daily; 11/10- 11/16: take 1 capsule two times daily; 11/17 - 11/23: take 1 capsule once daily; from 11/24 and on, see other prescrption sent to [facility] 02/27/22   Nita Sells, MD  vancomycin (VANCOCIN) 125 MG capsule 03/23/22 - 04/19/22: take 1 capsule every other day; 04/20/22- 05/20/22: take 1 capsule every three days Patient not taking: Reported on 03/01/2022 03/23/22   Nita Sells, MD    Blood pressure (!) 111/48, pulse 66, temperature 98.1 F (36.7 C), resp. rate (!) 23, height '5\' 2"'$  (1.575 m), weight 41.3 kg, SpO2 99 %. Physical Exam: General: pleasant, frail, elderly female who is laying in bed in NAD HEENT:  head is normocephalic, atraumatic.  Sclera are noninjected.  Pupils equal and round.  Ears and nose without any masses or lesions.  Mouth is pink and moist. Dentition fair.  Temple wasting Heart: regular, rate, and rhythm.  Normal s1,s2. No obvious murmurs, gallops, or rubs noted.  Palpable radial and pedal pulses bilaterally  Lungs: CTAB, no wheezes, rhonchi, or rales noted.  Respiratory effort nonlabored Abd: distended, tight, tympanitic, mild diffuse tenderness, no peritonitis MS: significant BLE edema.  BUE symmetrical  Skin: warm and dry with no masses, lesions, or rashes Psych: A&Ox4 but with some repetitive questioning as if she was somewhat confused or with forgetfulness   Results for orders placed or performed during the hospital encounter of 03/05/22 (from the past 48 hour(s))  Comprehensive metabolic panel     Status: Abnormal   Collection Time: 03/05/22  4:07 PM  Result Value Ref Range   Sodium 129 (L) 135 - 145 mmol/L   Potassium 4.3 3.5 - 5.1 mmol/L   Chloride 94 (L) 98 - 111 mmol/L   CO2 23 22 - 32 mmol/L   Glucose, Bld 104 (H) 70 - 99 mg/dL    Comment: Glucose reference range applies only to samples taken after fasting for at least 8 hours.   BUN 16 8 - 23 mg/dL   Creatinine, Ser 0.49 0.44 - 1.00 mg/dL   Calcium 9.0 8.9 - 10.3 mg/dL   Total Protein 5.3 (L) 6.5 - 8.1 g/dL   Albumin 2.3 (L) 3.5 - 5.0 g/dL   AST 28 15 - 41 U/L   ALT 26 0 - 44 U/L   Alkaline Phosphatase 74 38 - 126 U/L   Total Bilirubin 0.5 0.3 - 1.2 mg/dL   GFR, Estimated >60 >60 mL/min    Comment: (NOTE) Calculated using the CKD-EPI Creatinine Equation (2021)    Anion gap 12 5 - 15    Comment: Performed at [facility], [place] [address], [place], [place] [address]  Lipase, blood     Status: None   Collection Time: 03/05/22  4:07 PM  Result Value Ref Range   Lipase 45 11 - 51 U/L    Comment: Performed at [facility] [address], [place], [place] [address]  CBC with Diff     Status:  Abnormal   Collection Time: 03/05/22  4:07 PM  Result Value Ref Range   WBC 13.5 (H) 4.0 - 10.5 K/uL   RBC 3.78 (L) 3.87 - 5.11 MIL/uL   Hemoglobin 11.4 (L) 12.0 - 15.0 g/dL   HCT 34.0 (L) 36.0 - 46.0 %   MCV 89.9 80.0 - 100.0 fL   MCH 30.2 26.0 - 34.0 pg   MCHC 33.5 30.0 - 36.0 g/dL   RDW 15.5 11.5 - 15.5 %   Platelets 356 150 - 400 K/uL   nRBC 0.0 0.0 - 0.2 %   Neutrophils Relative % 86 %   Neutro Abs 11.6 (H) 1.7 - 7.7 K/uL   Lymphocytes Relative 6 %   Lymphs Abs 0.9 0.7 - 4.0 K/uL   Monocytes Relative 7 %   Monocytes Absolute 0.9 0.1 - 1.0 K/uL   Eosinophils Relative 0 %   Eosinophils Absolute 0.0 0.0 - 0.5 K/uL   Basophils Relative 0 %   Basophils Absolute 0.0 0.0 - 0.1 K/uL   Immature Granulocytes 1 %   Abs Immature Granulocytes 0.07 0.00 - 0.07 K/uL    Comment: Performed at [facility], [place] [address], [place], [place] [address]  Magnesium     Status: None   Collection Time: 03/05/22 10:18 PM  Result Value Ref Range   Magnesium 1.7 1.7 - 2.4 mg/dL    Comment: Performed at [facility] [address] [place], [place] [address]  Phosphorus     Status: None   Collection Time: 03/05/22 10:18 PM  Result Value Ref Range   Phosphorus 2.7 2.5 - 4.6 mg/dL    Comment: Performed at [facility] [address], [place], [place] [address]  CBC with Differential/Platelet     Status: Abnormal   Collection Time: 03/06/22  5:33 AM  Result Value Ref Range   WBC 11.0 (H) 4.0 - 10.5 K/uL   RBC 3.66 (L) 3.87 - 5.11 MIL/uL   Hemoglobin 11.3 (L) 12.0 - 15.0 g/dL  HCT 33.0 (L) 36.0 - 46.0 %   MCV 90.2 80.0 - 100.0 fL   MCH 30.9 26.0 - 34.0 pg   MCHC 34.2 30.0 - 36.0 g/dL   RDW 15.6 (H) 11.5 - 15.5 %   Platelets 299 150 - 400 K/uL   nRBC 0.0 0.0 - 0.2 %   Neutrophils Relative % 81 %   Neutro Abs 8.9 (H) 1.7 - 7.7 K/uL   Lymphocytes Relative 8 %   Lymphs Abs 0.9 0.7 - 4.0 K/uL   Monocytes Relative 9 %   Monocytes Absolute 1.0 0.1 - 1.0 K/uL   Eosinophils  Relative 1 %   Eosinophils Absolute 0.1 0.0 - 0.5 K/uL   Basophils Relative 0 %   Basophils Absolute 0.0 0.0 - 0.1 K/uL   Immature Granulocytes 1 %   Abs Immature Granulocytes 0.06 0.00 - 0.07 K/uL    Comment: Performed at [facility] [address] [place], [place] [address]  Comprehensive metabolic panel     Status: Abnormal   Collection Time: 03/06/22  5:33 AM  Result Value Ref Range   Sodium 128 (L) 135 - 145 mmol/L   Potassium 4.2 3.5 - 5.1 mmol/L   Chloride 96 (L) 98 - 111 mmol/L   CO2 24 22 - 32 mmol/L   Glucose, Bld 101 (H) 70 - 99 mg/dL    Comment: Glucose reference range applies only to samples taken after fasting for at least 8 hours.   BUN 16 8 - 23 mg/dL   Creatinine, Ser 0.49 0.44 - 1.00 mg/dL   Calcium 8.5 (L) 8.9 - 10.3 mg/dL   Total Protein 4.9 (L) 6.5 - 8.1 g/dL   Albumin 2.0 (L) 3.5 - 5.0 g/dL   AST 34 15 - 41 U/L   ALT 26 0 - 44 U/L   Alkaline Phosphatase 68 38 - 126 U/L   Total Bilirubin 0.9 0.3 - 1.2 mg/dL   GFR, Estimated >60 >60 mL/min    Comment: (NOTE) Calculated using the CKD-EPI Creatinine Equation (2021)    Anion gap 8 5 - 15    Comment: Performed at [facility], [place] [address] [place], [place] [address]  Magnesium     Status: None   Collection Time: 03/06/22  5:33 AM  Result Value Ref Range   Magnesium 1.7 1.7 - 2.4 mg/dL    Comment: Performed at [facility] [address], [place], [place] [address]   CT ABDOMEN PELVIS W CONTRAST  Result Date: 03/05/2022 CLINICAL DATA:  Complains of abdominal swelling. EXAM: CT ABDOMEN AND PELVIS WITH CONTRAST TECHNIQUE: Multidetector CT imaging of the abdomen and pelvis was performed using the standard protocol following bolus administration of intravenous contrast. RADIATION DOSE REDUCTION: This exam was performed according to the departmental dose-optimization program which includes automated exposure control, adjustment of the mA and/or kV according to patient size and/or use of  iterative reconstruction technique. CONTRAST:  18m OMNIPAQUE IOHEXOL 350 MG/ML SOLN COMPARISON:  02/24/2022 FINDINGS: Lower chest: Unchanged small left pleural effusion. Trace right effusion appears similar to previous exam. Peripheral tree-in-bud nodularity with areas of mucoid impaction again noted within both lung bases. Hepatobiliary: No suspicious liver lesion. Cholecystectomy. No biliary dilatation. Pancreas: Unremarkable. No pancreatic ductal dilatation or surrounding inflammatory changes. Spleen: Normal in size without focal abnormality. Adrenals/Urinary Tract: No adrenal mass. Simple appearing left kidney cyst measures 1.3 cm, image 30/3. No nephrolithiasis or hydronephrosis. Urinary bladder is unremarkable. Stomach/Bowel: Small to moderate hiatal hernia.  Increase caliber of the proximal small bowel loops within the left hemiabdomen which measure up to 2.9 cm, image 47/3. Fecalization of the prominent small bowel loops noted compatible with diminished bowel transit. There is persistent colonic distension. The cecum measures up to 9.3 cm compared with 7.8 cm previously. Colonic air-fluid levels are identified. Assessment for colonic wall thickening is decreased due to diffuse colonic distension. Transition to decreased caliber distal colon is noted at the level of the mid sigmoid colon in the left hemipelvis, image 64/3. Signs of pelvic floor laxity identified with evidence of small rectocele. No signs pneumatosis or bowel perforation. Vascular/Lymphatic: Aortic atherosclerosis. Unchanged splenic artery aneurysm measuring 1.5 cm, image 20/3. No signs of abdominopelvic adenopathy. Reproductive: Status post hysterectomy. No adnexal masses. Other: A trace amount of free fluid is noted within the dependent portion of the pelvis, image 69/3. No free air identified. Musculoskeletal: No acute or significant osseous findings. There is diffuse subcutaneous edema involving the abdomen and visualized portions of the  lower extremity. IMPRESSION: 1. Persistent and mildly progressive colonic distension with air-fluid levels. Transition to decreased caliber distal colon at the level of the mid sigmoid colon in the left hemipelvis. Increase caliber of the proximal small bowel loops within the left hemiabdomen which measure up to 2.9 cm. Fecalization of the prominent small bowel loops noted compatible with diminished bowel transit. Etiology of the persistent colonic distension is indeterminate. Cannot exclude toxic megacolon in this patient who has a reported history of C diff colitis. No signs of pneumatosis or bowel perforation at this time. 2. Small to moderate hiatal hernia. 3. Unchanged small left pleural effusion. Trace right effusion appears similar to previous exam. 4. Peripheral tree-in-bud nodularity with areas of mucoid impaction again noted within both lung bases. Findings are favored to represent sequelae of chronic indolent atypical infection such as MAI. 5. Unchanged splenic artery aneurysm measuring 1.5 cm. 6. Signs of pelvic floor laxity with evidence of small rectocele. 7. Trace amount of free fluid is noted within the dependent portion of the pelvis. 8.  Aortic Atherosclerosis (ICD10-I70.0). Electronically Signed   By: Kerby Moors M.D.   On: 03/05/2022 19:32   DG Abdomen 1 View  Result Date: 03/05/2022 CLINICAL DATA:  Abdominal distention EXAM: ABDOMEN - 1 VIEW COMPARISON:  02/27/2022 FINDINGS: There is marked gaseous distention of right colon measuring up to 14.7 cm in diameter. Ascending colon measured 12.8 cm in the previous study. There is mild dilation of few small bowel loops measuring up to 3.5 cm. Stomach is not distended. There is lucency in the retrocardiac region, possibly suggesting presence of small hiatal hernia. IMPRESSION: There is marked dilation of ascending colon with interval worsening. There is mild dilation of small-bowel loops. Findings may suggest ileus. If there are continued  symptoms, follow-up CT may be considered. Electronically Signed   By: Elmer Picker M.D.   On: 03/05/2022 17:08   DG Chest 2 View  Result Date: 03/05/2022 CLINICAL DATA:  Cough EXAM: CHEST - 2 VIEW COMPARISON:  01/26/2022 FINDINGS: Cardiac size is within normal limits. Low position of diaphragms suggests COPD. There are no signs of alveolar pulmonary edema or focal pulmonary consolidation. There is interval appearance of small bilateral pleural effusions. There is no pneumothorax. IMPRESSION: Small bilateral pleural effusions. COPD. There are no signs of pulmonary edema or focal pulmonary consolidation. Electronically Signed   By: Elmer Picker M.D.   On: 03/05/2022 17:05      Assessment/Plan Recurrent c diff colitis Ileus,  colonic atony - Patient with a prolonged course of recurrent c diff colitis dating back to 2016, 2018, and more recently 11/2021. Since August she has been treated with multiple courses of dificid, followed by a course of Entocort, and most recently a pulse vancomycin taper 11/1. She continues to have symptoms and CT scan now concerning for diffuse and persistent colonic distension with cecum measuring up to 9.3cm. Patient is hemodynamically stable, no peritonitis on exam, do not suspect that she has Toxic megacolon at this time as she is not tachy, AF, WBC is only 11, and her BP is normal. Do not recommend any acute surgical intervention at this time. I do not think she needs an NGT as she has no nausea or vomiting, and this will not help with her colon distention.  She has failed multiple abx treatments for her c. Diff colitis.  Will defer to GI on further management.  Not sure if it is possible for a fecal transplant or oral fecal product to help resolve this c diff infection.  Subtotal colectomy would be last resort for treatment and patient is very malnourished and would be a big deal for her to tolerate.  She also states she does not want an operation if she can help  it or a colostomy. We will follow with you.  ID - IV flagyl, PO vancomycin VTE - SCDs, hold eliquis, ok for heparin gtt if needed FEN - IVF, NPO Foley - none  Paroxysmal atrial fibrillation on eliquis  Prior TIA Prior history of H. pylori gastritis Protein calorie malnutrition Hyponatremia  I reviewed Consultant GI notes, hospitalist notes, last 24 h vitals and pain scores, last 48 h intake and output, last 24 h labs and trends, and last 24 h imaging results   Saverio Danker, [facility] Surgery 03/06/2022, 1:17 PM Please see Amion for pager number during day hours 7:00am-4:30pm

## 2022-03-06 NOTE — Assessment & Plan Note (Signed)
Patient with recurrent C diff she has been on vancomycin therapy. At home with worsening symptoms.  Plan to continue supportive medical therapy, including IV metronidazole. Will contact [facility] tertiary care for opinion as well.

## 2022-03-06 NOTE — Telephone Encounter (Signed)
Called Julie Montgomery no answer LMOM w/MD response.Marland KitchenJohny Montgomery

## 2022-03-06 NOTE — ED Notes (Signed)
Bladder scan resulted at 46ms.

## 2022-03-06 NOTE — Assessment & Plan Note (Signed)
Patient with calorie protein malnutrition, recurrent C diff infection and poor oral intake Currently will place NPO due to ileus.

## 2022-03-06 NOTE — ED Notes (Signed)
Pt and family expressing concern about not knowing what the plan is and whether she is getting the care she needs.  They stated that the were thinking about taking her to [facility] if that would improve her care.  Dr. Cathlean Sauer contacted by phone and asked to come have a conversation with the family.

## 2022-03-06 NOTE — Assessment & Plan Note (Signed)
Patient not able to take PO meds due to ileus. Continue telemetry monitoring, will add IV metoprolol as needed in case tachycardia. Continue IV fluids.

## 2022-03-06 NOTE — Progress Notes (Addendum)
Progress Note   Patient: Julie Montgomery BWG:[id number] DOB: Sep 13, 1935 DOA: 03/05/2022     0 DOS: the patient was seen and examined on 03/06/2022   Brief hospital course: Julie Montgomery was admitted to the hospital with the working diagnosis of dehydration.   86 yo female with the past medical history of dementia, paroxysmal atrial fibrillation, recurrent C diff, and chronic hyponatremia who presented with decreased oral intake. Recent hospitalization from 10/25 to 02/28/22 for recurrent C diff colitis. She was discharge on oral vancomycin taper. At home her diarrhea has been improving, but she had very poor oral intake for the last 4 to 5 days, prompting her husband to bring her back to the hospital.  On her initial physical examination her blood pressure was 125/68, HR 80, RR 16 and 02 saturation 100% on room air, dry mucous membranes, lungs with no wheezing or rhonchi, heart with S1 and S2 present and rhythmic, abdomen with no distention with mild generalized tenderness with no rebound or guarding, positive lower extremity edema.   Na 129, K 4,3 CL 94 bicarbonate 23, glucose 104 bun 16 cr 0,49  Wbc 13,5 hgb 11,4 plt 356   Abdominal radiograph with marked dilatation of the ascending colon with interval worsening. Mild dilatation of the small bowel loops. Findings consistent with ileus.   CT abdomen and pelvis with persistent and mildly progressive colonic distention with air fluid levels, decreased caliber distal colon at the level of the mid sigmoid colon and increase caliber proximal small bowel loops with the left hemi abdomen. Fecalization of the prominent small bowel loops.   Chest radiograph with hyperinflation and small left pleural effusion, no infiltrates.   Assessment and Plan: * Ileus [facility]) Patient with ileus on physical examination and radiographic imaging. She has recurrent C diff.  Positive leukocytosis but not fever.  Plan to insert NG tube and place to low intermittent  suction. Continue supportive medical therapy with IV fluids with dextrose, antiacid therapy with H2 blockers, as needed analgesics and antiemetics.  Antibiotic therapy with IV metronidazole Will consult surgery and GI for further recommendations.  High risk for toxic megacolon.    Recurrent Clostridioides difficile infection Patient with recurrent C diff she has been on vancomycin therapy. At home with worsening symptoms.  Plan to continue supportive medical therapy, including IV metronidazole. Will contact [facility] tertiary care for opinion as well.   Dehydration Hypovolemic hyponatremia.  Hypomagnesemia   Patient with hypovolemia, plan to continue IV fluids with isotonic saline and dextrose. Close blood pressure monitoring.  Add 2 g mag sulfate IV  Paroxysmal atrial fibrillation (HCC) Patient not able to take PO meds due to ileus. Continue telemetry monitoring, will add IV metoprolol as needed in case tachycardia. Continue IV fluids.   Anemia of chronic disease Continue close follow up on cell count   Protein calorie malnutrition ([facility]) Patient with calorie protein malnutrition, recurrent C diff infection and poor oral intake Currently will place NPO due to ileus.         Subjective: Patient with abdominal pain and distention, no bowel movement or flatus, positive nausea but not vomiting, no po intake.   Physical Exam: Vitals:   03/06/22 1000 03/06/22 1030 03/06/22 1100 03/06/22 1130  BP: 95/75 119/74 (!) 113/51 (!) 111/48  Pulse: 75 70 69 66  Resp: 16 (!) 21 20 (!) 23  Temp:      TempSrc:      SpO2: 99% 100% 99% 99%  Weight:  Height:       Neurology awake and alert, deconditioned and ill looking appearing  ENT with pallor but no icterus Respiratory with no rales or wheezing Cardiovascular with S1 and S2 present and rhythmic with no gallops, rubs or murmurs Abdomen is distended, tender to superficial palpation with no bowel sounds and tympanic to  percussion Positive lower extremity edema ++ pitting  Data Reviewed:    Family Communication: I spoke with patient's husband and son at the bedside, we talked in detail about patient's condition, plan of care and prognosis and all questions were addressed.   Disposition: Status is: Observation The patient will require care spanning > 2 midnights and should be moved to inpatient because: IV fluids, IV antibiotics, surgery and GI consultation   Planned Discharge Destination: Home    Author: Tawni Millers, MD 03/06/2022 12:46 PM  For on call review [email or web address].

## 2022-03-06 NOTE — Consult Note (Addendum)
Consultation  Referring Provider:   [facility] Primary Care Physician:  Cassandria Anger, MD Primary Gastroenterologist:  Dr. Fuller Plan       Reason for Consultation:     Recurrent Cdiff with possible ileus versus toxic megacolon   Impression    Recurrent C. Difficile Patient uncertain if she completed vancomycin taper  Possible toxic megacolon versus colonic ileus in setting of recurrent Cdiff Recurrent Cdiff, imodium use at home, has not had BM or passing gas in several days, some nausea, no vomiting per patient but unknown reliable historian  Protein calorie malnutrition Albumin 03/06/2022  2.0  BMI body mass index is 16.64 kg/m.  Secondary to Cdiff  Anemia of  chronic disease With GI bleeding  Paroxysmal atrial fibrillation Eliquis on hold    LOS: 0 days     Plan   -Recurrent Cdiff, with colonic ileus/concern for toxic megacolon on CT, has been using imodium - patient on flagyl IV '500mg'$  q 8 hours, PO vancomycin - Consider VOWST or Rebyota for patient? Will suggest reconsult ID - General surgery consulted - Will get lactic acid --Uncertain if patient is a candidate for colon decompression in setting of toxic megacolon/possible Cdiff or if needs rectal tube, further recommendations per Dr. Havery Moros.   -Continue to maintain magnesium above 2 and potassium at 4-4.5.  -Encourage early ambulation. -Minimize narcotics and anticholinergic medications - NG tube placement for decompression -Strict NPO status -Maintain adequate hydration with IVF maintenance -Roll patient side-to-side Q2H  - eliquis on hold  - RD consult Count calories, increase protein Consider core track if prolonged NPO  Thank you for your kind consultation, we will continue to follow.         HPI:   Julie Montgomery is a 86 y.o. female with past medical history significant for prior TIA, osteopenia, prior history of H. pylori gastritis, adenomatous colon polyps, bronchiectasis, and A-fib for  which she is on Eliquis and history of multiple episodes of recurrent C. difficile colitis.   Current C. Difficile 16, 2018 11/2021 acute C. difficile colitis after course of Keflex for skin infection, 10-day course of Dificid with improvement of symptoms. 01/11/2022 through 01/13/2022 recurrent diarrhea C. difficile antigen, PCR positive toxin negative.  CT with diffuse colonic inflammatory changes, another course of Dificid x10 days 02/07/2022 recurrent diarrhea toxin negative PCR positive briefly placed on Dificid but discontinued it C. difficile studies were negative.  Placed on Entocort 9 mg daily gradual taper for residual colitis, taper down to 6 mg and recurrence of diarrhea. 02/22/2022 quick screen C. difficile positive negative toxin positive PCR CT no small bowel distention or wall thickening progressive moderate dilatation proximal to mid colon with mild wall thickening and mucosal hyperenhancement scattered air-fluid levels in the cecum distal colon decompressed but noted persistent wall thickening and surrounding inflammation suspicious for diffuse colitis, no pneumonitis or perforation. Discharged 02/28/2022 on vancomycin pulsed taper  Patient presented to the ER this visit with poor PO intake.  Patient was alone in the room, husband on was not there, patient had poor short-term memory, poor historian.  Try to contact on we will try to do so again. Patient states has been taking Imodium as needed for loose stools, no fevers no chills.  No melena or hematochezia. States she has not had a bowel movement "and I do not know when" and is not passing gas last several days. Patient has abdominal distention and abdominal discomfort "feels like her stomach will burst".  While patient has had nausea, she denies any vomiting however she is also had significant decreased p.o. intake.   Sodium 139, creatinine 0.49, normal liver, normal lipase.  CBC 13, hemoglobin 11.4 compared to 13. CT abdomen pelvis  with contrast in the ER yesterday showed mildly progressive colonic distention with air-fluid levels transition decreased caliber distal colon at the level of mid sigmoid, proximal small bowel loops within the left hemiabdomen measuring 2.9, fecalization of prominent small bowel loops noted compatible with diminished bowel transit concerning for toxic megacolon patient reported history of's C. difficile colitis, no signs of pneumatosis or bowel perforation.  Small moderate hiatal hernia, small left pleural effusion and trace right effusion  Colonoscopy in November 2020 for weight loss at that time had one 7 mm sessile polyp removed which was a tubular adenoma and noted multiple left colon diverticuli. EGD November 2020 with small hiatal hernia, gastritis which was H. pylori positive.  Abnormal ED labs: Abnormal Labs Reviewed  COMPREHENSIVE METABOLIC PANEL - Abnormal; Notable for the following components:      Result Value   Sodium 129 (*)    Chloride 94 (*)    Glucose, Bld 104 (*)    Total Protein 5.3 (*)    Albumin 2.3 (*)    All other components within normal limits  CBC WITH DIFFERENTIAL/PLATELET - Abnormal; Notable for the following components:   WBC 13.5 (*)    RBC 3.78 (*)    Hemoglobin 11.4 (*)    HCT 34.0 (*)    Neutro Abs 11.6 (*)    All other components within normal limits  CBC WITH DIFFERENTIAL/PLATELET - Abnormal; Notable for the following components:   WBC 11.0 (*)    RBC 3.66 (*)    Hemoglobin 11.3 (*)    HCT 33.0 (*)    RDW 15.6 (*)    Neutro Abs 8.9 (*)    All other components within normal limits  COMPREHENSIVE METABOLIC PANEL - Abnormal; Notable for the following components:   Sodium 128 (*)    Chloride 96 (*)    Glucose, Bld 101 (*)    Calcium 8.5 (*)    Total Protein 4.9 (*)    Albumin 2.0 (*)    All other components within normal limits     Past Medical History:  Diagnosis Date   Adenomatous polyp of colon 2020   Allergy    Anxiety    Basal cell  carcinoma (BCC)    Cataract    Depression    Helicobacter pylori gastritis    Osteopenia    Paroxysmal atrial fibrillation (HCC)    Pneumonia    hx of 03/2008   Stroke [facility])    TIA long time ago    Surgical History:  She  has a past surgical history that includes Partial hysterectomy; Breast lumpectomy; Appendectomy; Cesarean section; Cholecystectomy; Rotator cuff repair (2012); teeth remmoved; Cataract extraction w/ intraocular lens  implant, bilateral (Bilateral); Mohs surgery; and Skin graft. Family History:  Her family history includes Cancer in her brother and sister; Colon cancer in her son; Coronary artery disease in her brother; Dementia in her brother; Diabetes in her sister; Stomach cancer in her paternal grandmother. Social History:   reports that she has never smoked. She has never used smokeless tobacco. She reports that she does not drink alcohol and does not use drugs.  Prior to Admission medications   Medication Sig Start Date End Date Taking? Authorizing Provider  apixaban (ELIQUIS) 2.5 MG TABS tablet Take  1 tablet (2.5 mg total) by mouth 2 (two) times daily. 03/01/22   Plotnikov, Evie Lacks, MD  diltiazem (CARDIZEM CD) 180 MG 24 hr capsule Take 1 capsule (180 mg total) by mouth daily. 03/01/22   Sheikh, Omair Latif, DO  memantine (NAMENDA) 5 MG tablet Take 5 mg by mouth 2 (two) times daily. 02/20/22   [provider]  Probiotic Product (ALIGN) 4 MG CAPS Take 1 capsule (4 mg total) by mouth daily. 12/26/21   Plotnikov, Evie Lacks, MD  saccharomyces boulardii (FLORASTOR) 250 MG capsule Take 1 capsule (250 mg total) by mouth 2 (two) times daily. 02/12/22 03/14/22  Caren Griffins, MD  vancomycin (VANCOCIN) 125 MG capsule 02/27/22 - 03/08/22: Take 1 capsule four times daily; 11/10- 11/16: take 1 capsule two times daily; 11/17 - 11/23: take 1 capsule once daily; from 11/24 and on, see other prescrption sent to [facility] 02/27/22   Nita Sells, MD  vancomycin (VANCOCIN)  125 MG capsule 03/23/22 - 04/19/22: take 1 capsule every other day; 04/20/22- 05/20/22: take 1 capsule every three days Patient not taking: Reported on 03/01/2022 03/23/22   Nita Sells, MD    Current Facility-Administered Medications  Medication Dose Route Frequency Provider Last Rate Last Admin   acetaminophen (TYLENOL) tablet 650 mg  650 mg Oral Q6H PRN Howerter, Justin B, DO       Or   acetaminophen (TYLENOL) suppository 650 mg  650 mg Rectal Q6H PRN Howerter, Justin B, DO       dextrose 5 %-0.9 % sodium chloride infusion   Intravenous Continuous Arrien, Jimmy Picket, MD       famotidine (PEPCID) IVPB 20 mg premix  20 mg Intravenous Q24H Arrien, Jimmy Picket, MD       fentaNYL (SUBLIMAZE) injection 50 mcg  50 mcg Intravenous Q2H PRN Howerter, Justin B, DO   50 mcg at 03/06/22 0550   naloxone (NARCAN) injection 0.4 mg  0.4 mg Intravenous PRN Howerter, Justin B, DO       ondansetron (ZOFRAN) injection 4 mg  4 mg Intravenous Q6H PRN Howerter, Justin B, DO       Current Outpatient Medications  Medication Sig Dispense Refill   apixaban (ELIQUIS) 2.5 MG TABS tablet Take 1 tablet (2.5 mg total) by mouth 2 (two) times daily. 60 tablet 5   diltiazem (CARDIZEM CD) 180 MG 24 hr capsule Take 1 capsule (180 mg total) by mouth daily. 30 capsule 0   memantine (NAMENDA) 5 MG tablet Take 5 mg by mouth 2 (two) times daily.     Probiotic Product (ALIGN) 4 MG CAPS Take 1 capsule (4 mg total) by mouth daily. 30 capsule 1   saccharomyces boulardii (FLORASTOR) 250 MG capsule Take 1 capsule (250 mg total) by mouth 2 (two) times daily. 60 capsule 0   vancomycin (VANCOCIN) 125 MG capsule 02/27/22 - 03/08/22: Take 1 capsule four times daily; 11/10- 11/16: take 1 capsule two times daily; 11/17 - 11/23: take 1 capsule once daily; from 11/24 and on, see other prescrption sent to [facility] 60 capsule 0   [START ON 03/23/2022] vancomycin (VANCOCIN) 125 MG capsule 03/23/22 - 04/19/22: take 1 capsule every other day;  04/20/22- 05/20/22: take 1 capsule every three days (Patient not taking: Reported on 03/01/2022) 24 capsule 0    Allergies as of 03/05/2022 - Review Complete 03/05/2022  Allergen Reaction Noted   Lovastatin Other (See Comments)    Mometasone furo-formoterol fum Other (See Comments) 12/08/2010   Prozac [fluoxetine hcl] Other (See  Comments) 12/13/2021   Sulfa antibiotics Other (See Comments) 03/01/2012    Review of Systems:    Constitutional: No weight loss, fever, chills, weakness or fatigue HEENT: Eyes: No change in vision               Ears, Nose, Throat:  No change in hearing or congestion Skin: No rash or itching Cardiovascular: No chest pain, chest pressure or palpitations   Respiratory: No SOB or cough Gastrointestinal: See HPI and otherwise negative Genitourinary: No dysuria or change in urinary frequency Neurological: No headache, dizziness or syncope Musculoskeletal: No new muscle or joint pain Hematologic: No bleeding or bruising Psychiatric: No history of depression or anxiety     Physical Exam:  Vital signs in last 24 hours: Temp:  [98.1 F (36.7 C)-98.8 F (37.1 C)] 98.1 F (36.7 C) (11/07 0426) Pulse Rate:  [53-80] 66 (11/07 1130) Resp:  [9-23] 23 (11/07 1130) BP: (95-128)/(48-75) 111/48 (11/07 1130) SpO2:  [97 %-100 %] 99 % (11/07 1130) Weight:  [41.3 kg] 41.3 kg (11/06 1527)   Last BM recorded by nurses in past 5 days No data recorded  General:   Thin, chronically ill-appearing female Head:  Normocephalic and atraumatic. Eyes: sclerae anicteric,conjunctive pink  Heart:  regular rate and rhythm Pulm: Clear anteriorly; no wheezing Abdomen: Grossly Distended,hard Absent bowel sounds. moderate tenderness in the entire abdomen. With guarding and Without rebound, No organomegaly appreciated. Extremities:  Without edema. Msk:  Symmetrical without gross deformities. Peripheral pulses intact.  Neurologic:  Alert and  oriented x4; Poor short term memory. No focal  deficits.  Skin:   Dry and intact without significant lesions or rashes. Psychiatric:  Cooperative. Normal mood and affect.  LAB RESULTS: Recent Labs    03/05/22 1607 03/06/22 0533  WBC 13.5* 11.0*  HGB 11.4* 11.3*  HCT 34.0* 33.0*  PLT 356 299   BMET Recent Labs    03/05/22 1607 03/06/22 0533  NA 129* 128*  K 4.3 4.2  CL 94* 96*  CO2 23 24  GLUCOSE 104* 101*  BUN 16 16  CREATININE 0.49 0.49  CALCIUM 9.0 8.5*   LFT Recent Labs    03/06/22 0533  PROT 4.9*  ALBUMIN 2.0*  AST 34  ALT 26  ALKPHOS 68  BILITOT 0.9   PT/INR No results for input(s): "LABPROT", "INR" in the last 72 hours.  STUDIES: CT ABDOMEN PELVIS W CONTRAST  Result Date: 03/05/2022 CLINICAL DATA:  Complains of abdominal swelling. EXAM: CT ABDOMEN AND PELVIS WITH CONTRAST TECHNIQUE: Multidetector CT imaging of the abdomen and pelvis was performed using the standard protocol following bolus administration of intravenous contrast. RADIATION DOSE REDUCTION: This exam was performed according to the departmental dose-optimization program which includes automated exposure control, adjustment of the mA and/or kV according to patient size and/or use of iterative reconstruction technique. CONTRAST:  57m OMNIPAQUE IOHEXOL 350 MG/ML SOLN COMPARISON:  02/24/2022 FINDINGS: Lower chest: Unchanged small left pleural effusion. Trace right effusion appears similar to previous exam. Peripheral tree-in-bud nodularity with areas of mucoid impaction again noted within both lung bases. Hepatobiliary: No suspicious liver lesion. Cholecystectomy. No biliary dilatation. Pancreas: Unremarkable. No pancreatic ductal dilatation or surrounding inflammatory changes. Spleen: Normal in size without focal abnormality. Adrenals/Urinary Tract: No adrenal mass. Simple appearing left kidney cyst measures 1.3 cm, image 30/3. No nephrolithiasis or hydronephrosis. Urinary bladder is unremarkable. Stomach/Bowel: Small to moderate hiatal hernia.  Increase caliber of the proximal small bowel loops within the left hemiabdomen which measure up to 2.9 cm,  image 47/3. Fecalization of the prominent small bowel loops noted compatible with diminished bowel transit. There is persistent colonic distension. The cecum measures up to 9.3 cm compared with 7.8 cm previously. Colonic air-fluid levels are identified. Assessment for colonic wall thickening is decreased due to diffuse colonic distension. Transition to decreased caliber distal colon is noted at the level of the mid sigmoid colon in the left hemipelvis, image 64/3. Signs of pelvic floor laxity identified with evidence of small rectocele. No signs pneumatosis or bowel perforation. Vascular/Lymphatic: Aortic atherosclerosis. Unchanged splenic artery aneurysm measuring 1.5 cm, image 20/3. No signs of abdominopelvic adenopathy. Reproductive: Status post hysterectomy. No adnexal masses. Other: A trace amount of free fluid is noted within the dependent portion of the pelvis, image 69/3. No free air identified. Musculoskeletal: No acute or significant osseous findings. There is diffuse subcutaneous edema involving the abdomen and visualized portions of the lower extremity. IMPRESSION: 1. Persistent and mildly progressive colonic distension with air-fluid levels. Transition to decreased caliber distal colon at the level of the mid sigmoid colon in the left hemipelvis. Increase caliber of the proximal small bowel loops within the left hemiabdomen which measure up to 2.9 cm. Fecalization of the prominent small bowel loops noted compatible with diminished bowel transit. Etiology of the persistent colonic distension is indeterminate. Cannot exclude toxic megacolon in this patient who has a reported history of C diff colitis. No signs of pneumatosis or bowel perforation at this time. 2. Small to moderate hiatal hernia. 3. Unchanged small left pleural effusion. Trace right effusion appears similar to previous exam. 4.  Peripheral tree-in-bud nodularity with areas of mucoid impaction again noted within both lung bases. Findings are favored to represent sequelae of chronic indolent atypical infection such as MAI. 5. Unchanged splenic artery aneurysm measuring 1.5 cm. 6. Signs of pelvic floor laxity with evidence of small rectocele. 7. Trace amount of free fluid is noted within the dependent portion of the pelvis. 8.  Aortic Atherosclerosis (ICD10-I70.0). Electronically Signed   By: Kerby Moors M.D.   On: 03/05/2022 19:32   DG Abdomen 1 View  Result Date: 03/05/2022 CLINICAL DATA:  Abdominal distention EXAM: ABDOMEN - 1 VIEW COMPARISON:  02/27/2022 FINDINGS: There is marked gaseous distention of right colon measuring up to 14.7 cm in diameter. Ascending colon measured 12.8 cm in the previous study. There is mild dilation of few small bowel loops measuring up to 3.5 cm. Stomach is not distended. There is lucency in the retrocardiac region, possibly suggesting presence of small hiatal hernia. IMPRESSION: There is marked dilation of ascending colon with interval worsening. There is mild dilation of small-bowel loops. Findings may suggest ileus. If there are continued symptoms, follow-up CT may be considered. Electronically Signed   By: Elmer Picker M.D.   On: 03/05/2022 17:08   DG Chest 2 View  Result Date: 03/05/2022 CLINICAL DATA:  Cough EXAM: CHEST - 2 VIEW COMPARISON:  01/26/2022 FINDINGS: Cardiac size is within normal limits. Low position of diaphragms suggests COPD. There are no signs of alveolar pulmonary edema or focal pulmonary consolidation. There is interval appearance of small bilateral pleural effusions. There is no pneumothorax. IMPRESSION: Small bilateral pleural effusions. COPD. There are no signs of pulmonary edema or focal pulmonary consolidation. Electronically Signed   By: Elmer Picker M.D.   On: 03/05/2022 17:05     Vladimir Crofts  03/06/2022, 12:42 PM

## 2022-03-06 NOTE — Consult Note (Signed)
Consultation Note Date: 03/06/2022   Patient Name: Julie Montgomery  DOB: 06/10/1935  MRN: [id number]  Age / Sex: 86 y.o., female  PCP: Plotnikov, Evie Lacks, MD Referring Physician: Tawni Millers  Reason for Consultation: Establishing goals of care  HPI/Patient Profile: 86 y.o. female  with past medical history of  dementia, recurrent C. difficile, paroxysmal atrial fibrillation chronically anticoagulated on Eliquis, chronic hyponatremia, anemia of chronic disease associated baseline hemoglobin range 11-13, admitted on 03/05/2022 with poor oral intake and dehydration, diarrhea.   Patient has been hospitalized several times for recurrent C. difficile infections (9/14-9/16, 10/10-10/16, 10/26-11/1).  She is currently admitted again for AKI, hypercalcemia, ongoing management of C. difficile infection.  In the ED, CT of the abdomen and pelvis showed mildly progressive colonic distention with air-fluid levels and diminished bowel transit concerning for toxic megacolon, no signs of pneumatosis or bowel perforation.  Patient is a poor candidate for endoscopic or surgical intervention.  PMT has been consulted to assist with goals of care conversation.  Clinical Assessment and Goals of Care:  I have reviewed medical records including EPIC notes, labs and imaging, received report from RN, assessed the patient and then met at the bedside with patient's husband Timmothy Sours and son [name] to discuss diagnosis prognosis, [place], EOL wishes, disposition and options.  I introduced Palliative Medicine as specialized medical care for people living with serious illness. It focuses on providing relief from the symptoms and stress of a serious illness. The goal is to improve quality of life for both the patient and the family.  We discussed a brief life review of the patient and then focused on their current illness.   I attempted to  elicit values and goals of care important to the patient.    Medical History Review and Understanding:  We discussed patient's course of illness over the past 13 weeks or so, as well as debriefing updates received from several specialties today.  Reviewed patient's imaging findings and tenuous condition.  Patient and family verbalized understanding and appreciation.  Social History: Patient has been married for 65 years.  She has 1 son.  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  Functional and Nutritional State: [name] husband reports that since discharge on 11/1, patient has been completely dependent for all care needs and "has had no function."  She has eaten only a few bites at each meal.  He has been cooking all meals, dressing her, and she has only had 1 shower since returning home.    Palliative Symptoms: Abdominal pain/distention, edema of the lower extremities  Code Status: Concepts specific to code status, artifical feeding and hydration, and rehospitalization were considered and discussed.   Discussion: Patient shares that her values and priorities include her church, family, and to have the best she can quality of life for as long as she can.  She has had difficulty with worsening quality of life due to pain and not having the energy to visit with family or participate with her church throughout this illness.  [name] family is appreciative of palliative care role and agree it is important to start having discussions around the best case and worst-case scenarios.  On the other hand, patient feels like this is "depressing" and she would prefer to focus more on the positives.  She is very hesitant to participate.  She does not want to make decisions but rather would like for the medical team  to tell her what to do.  She states several times that she would like to have the safest and best plan in place to keep  living.  Patient and family agree it would be best to avoid invasive interventions if possible given the risk of more harm than benefit.   Counseled on the importance of shared decision making and that she is the only one in the room actually living through her current situation.  Counseled patient that we rely on her just as much as she relies on [place] in order to know when we have gone too far or made things worse for her.  Encouraged her to continue reflecting on her experiences and what is an acceptable quality of life for her.  Discussed that for anyone who begins to feel that life-prolonging interventions are no longer helping them reach their unique goals, the option of comfort care can ensure relief from suffering as the disease process continues naturally.  Emphasized that this occurs at a different point in the disease trajectory for each person.  Reassurance, emotional support and therapeutic listening was provided.   Discussed the importance of continued conversation with family and the medical providers regarding overall plan of care and treatment options, ensuring decisions are within the context of the patient's values and GOCs.   Questions and concerns were addressed.  Hard Choices booklet left for review. The family was encouraged to call with questions or concerns.  PMT will continue to support holistically.  SUMMARY OF RECOMMENDATIONS   -Continue full code/full scope treatment -Patient is very hopeful for improvement and hesitant to discuss potential for further decline, however she agreeable to ongoing support -Ongoing goals of care discussions pending clinical course -Spiritual care consulted at patient/family's request -Psychosocial and emotional support provided -PMT will continue to follow and support  Prognosis:  Unable to determine  Discharge Planning: To Be Determined      Primary Diagnoses: Present on Admission:  Dehydration  Protein calorie malnutrition ([place])   Paroxysmal atrial fibrillation (H[facility])  Recurrent Clostridioides difficile infection  Anemia of chronic disease  Ileus ([place])  Physical Exam Vitals and nursing note reviewed.  Constitutional:      Appearance: She is underweight. She is ill-appearing.  Cardiovascular:     Rate and Rhythm: Normal rate.  Pulmonary:     Effort: Pulmonary effort is normal.  Abdominal:     General: There is distension.  Neurological:     Mental Status: She is alert.  Psychiatric:        Behavior: Behavior is cooperative.        Cognition and Memory: Cognition is impaired.    Vital Signs: BP (!) 103/55   Pulse 70   Temp (!) 97.5 F (36.4 C) (Oral)   Resp 19   Ht _0  (1.575 m)   Wt 41.3 kg   SpO2 95%   BMI 16.64 kg/m  Pain Scale: 0-10   Pain Score: Asleep   SpO2: SpO2: 95 % O2 Device:SpO2: 95 % O2 Flow Rate: .   Palliative Assessment/Data: 30%      Total time: I spent 115 minutes in the care of the patient today in the above activities and documenting the encounter.  MDM: High   Lorelai Huyser Johnnette Litter, PA-C  Palliative Medicine Team Team phone # [phone]  Thank you for allowing the Palliative Medicine Team to assist in the care of this patient. Please utilize secure chat with additional questions, if there is no response within 30 minutes  please call the above phone number.  Palliative Medicine Team providers are available by phone from 7am to 7pm daily and can be reached through the team cell phone.  Should this patient require assistance outside of these hours, please call the patient's attending physician.

## 2022-03-06 NOTE — Hospital Course (Signed)
Mrs. Natascha was admitted to the hospital with the working diagnosis of dehydration.   86 yo female with the past medical history of dementia, paroxysmal atrial fibrillation, recurrent C diff, and chronic hyponatremia who presented with decreased oral intake. Recent hospitalization from 10/25 to 02/28/22 for recurrent C diff colitis. She was discharge on oral vancomycin taper. At home her diarrhea has been improving, but she had very poor oral intake for the last 4 to 5 days, prompting her husband to bring her back to the hospital.  On her initial physical examination her blood pressure was 125/68, HR 80, RR 16 and 02 saturation 100% on room air, dry mucous membranes, lungs with no wheezing or rhonchi, heart with S1 and S2 present and rhythmic, abdomen with no distention with mild generalized tenderness with no rebound or guarding, positive lower extremity edema.   Na 129, K 4,3 CL 94 bicarbonate 23, glucose 104 bun 16 cr 0,49  Wbc 13,5 hgb 11,4 plt 356   Abdominal radiograph with marked dilatation of the ascending colon with interval worsening. Mild dilatation of the small bowel loops. Findings consistent with ileus.   CT abdomen and pelvis with persistent and mildly progressive colonic distention with air fluid levels, decreased caliber distal colon at the level of the mid sigmoid colon and increase caliber proximal small bowel loops with the left hemi abdomen. Fecalization of the prominent small bowel loops.   Chest radiograph with hyperinflation and small left pleural effusion, no infiltrates.

## 2022-03-06 NOTE — Telephone Encounter (Signed)
Called Elta Guadeloupe (son) back gave him MD response. Elta Guadeloupe stated that they went ahead and took her back to the hospital yesterday. She is currently in the ER maybe getting admitted...[name]

## 2022-03-06 NOTE — Assessment & Plan Note (Addendum)
Patient with ileus on physical examination and radiographic imaging. She has recurrent C diff.  Positive leukocytosis but not fever.  Plan to insert NG tube and place to low intermittent suction. Continue supportive medical therapy with IV fluids with dextrose, antiacid therapy with H2 blockers, as needed analgesics and antiemetics.  Antibiotic therapy with IV metronidazole Will consult surgery and GI for further recommendations.  High risk for toxic megacolon.

## 2022-03-06 NOTE — ED Notes (Signed)
ED TO INPATIENT HANDOFF REPORT  ED Nurse Name and Phone #: Iona Coach Name/Age/Gender Julie Montgomery 86 y.o. female Room/Bed: 032C/032C  Code Status   Code Status: Full Code  Home/SNF/Other Home Patient oriented to: self, place, time, and situation Is this baseline? Yes   Triage Complete: Triage complete  Chief Complaint Abdominal pain [R10.9] Ileus ([place]) [K56.7]  Triage Note PER EMS: pt has been having fluid retention from abdomen down to her feet ongoing since she was discharged from the hospital 2 weeks ago. She reports she was supposed to have fluid removed then but was d/c home instead. She presents with swelling to abdomen and legs. She reports she wasn't given any medication prescriptions either. Hx of CHF. Denies cp/sob. Reports abd and back pain.  BP- 128/62, HR-72, 98% RA, CBG-127   Allergies Allergies  Allergen Reactions   Lovastatin Other (See Comments)    Unknown reaction   Mometasone Furo-Formoterol Fum Other (See Comments)    Loss of appetite, laryngitis    Prozac [Fluoxetine Hcl] Other (See Comments)    Jumpy   Sulfa Antibiotics Other (See Comments)    Unknown reaction    Level of Care/Admitting Diagnosis ED Disposition     ED Disposition  Admit   Condition  --   Comment  Hospital Area: [facility] [[id number]]  Level of Care: Telemetry Cardiac [103]  May admit patient to [facility] or [facility] if equivalent level of care is available:: No  Covid Evaluation: Asymptomatic - no recent exposure (last 10 days) testing not required  Diagnosis: Ileus [facility]) [[id number]]  Admitting Physician: Tawni Millers [[id number]]  Attending Physician: Tawni Millers [[id number]]  Certification:: I certify this patient will need inpatient services for at least 2 midnights  Estimated Length of Stay: 3          B Medical/Surgery History Past Medical History:  Diagnosis Date   Adenomatous polyp of colon 2020   Allergy    Anxiety     Basal cell carcinoma (BCC)    Cataract    Depression    Helicobacter pylori gastritis    Osteopenia    Paroxysmal atrial fibrillation (HCC)    Pneumonia    hx of 03/2008   Stroke [facility])    TIA long time ago   Past Surgical History:  Procedure Laterality Date   APPENDECTOMY     BREAST LUMPECTOMY     right   CATARACT EXTRACTION W/ INTRAOCULAR LENS  IMPLANT, BILATERAL Bilateral    CESAREAN [place]  2012   SKIN GRAFT     teeth remmoved       A IV Location/Drains/Wounds Patient Lines/Drains/Airways Status     Active Line/Drains/Airways     Name Placement date Placement time Site Days   Peripheral IV 03/05/22 22 G Right Antecubital 03/05/22  1821  Antecubital  1            Intake/Output Last 24 hours No intake or output data in the 24 hours ending 03/06/22 1556  Labs/Imaging Results for orders placed or performed during the hospital encounter of 03/05/22 (from the past 48 hour(s))  Comprehensive metabolic panel     Status: Abnormal   Collection Time: 03/05/22  4:07 PM  Result Value Ref Range   Sodium 129 (L) 135 - 145 mmol/L   Potassium 4.3 3.5 -  5.1 mmol/L   Chloride 94 (L) 98 - 111 mmol/L   CO2 23 22 - 32 mmol/L   Glucose, Bld 104 (H) 70 - 99 mg/dL    Comment: Glucose reference range applies only to samples taken after fasting for at least 8 hours.   BUN 16 8 - 23 mg/dL   Creatinine, Ser 0.49 0.44 - 1.00 mg/dL   Calcium 9.0 8.9 - 10.3 mg/dL   Total Protein 5.3 (L) 6.5 - 8.1 g/dL   Albumin 2.3 (L) 3.5 - 5.0 g/dL   AST 28 15 - 41 U/L   ALT 26 0 - 44 U/L   Alkaline Phosphatase 74 38 - 126 U/L   Total Bilirubin 0.5 0.3 - 1.2 mg/dL   GFR, Estimated >60 >60 mL/min    Comment: (NOTE) Calculated using the CKD-EPI Creatinine Equation (2021)    Anion gap 12 5 - 15    Comment: Performed at [facility], [place] [address] [place], [place] [address]  Lipase, blood      Status: None   Collection Time: 03/05/22  4:07 PM  Result Value Ref Range   Lipase 45 11 - 51 U/L    Comment: Performed at [employer] [address], [place], [place] [address]  CBC with Diff     Status: Abnormal   Collection Time: 03/05/22  4:07 PM  Result Value Ref Range   WBC 13.5 (H) 4.0 - 10.5 K/uL   RBC 3.78 (L) 3.87 - 5.11 MIL/uL   Hemoglobin 11.4 (L) 12.0 - 15.0 g/dL   HCT 34.0 (L) 36.0 - 46.0 %   MCV 89.9 80.0 - 100.0 fL   MCH 30.2 26.0 - 34.0 pg   MCHC 33.5 30.0 - 36.0 g/dL   RDW 15.5 11.5 - 15.5 %   Platelets 356 150 - 400 K/uL   nRBC 0.0 0.0 - 0.2 %   Neutrophils Relative % 86 %   Neutro Abs 11.6 (H) 1.7 - 7.7 K/uL   Lymphocytes Relative 6 %   Lymphs Abs 0.9 0.7 - 4.0 K/uL   Monocytes Relative 7 %   Monocytes Absolute 0.9 0.1 - 1.0 K/uL   Eosinophils Relative 0 %   Eosinophils Absolute 0.0 0.0 - 0.5 K/uL   Basophils Relative 0 %   Basophils Absolute 0.0 0.0 - 0.1 K/uL   Immature Granulocytes 1 %   Abs Immature Granulocytes 0.07 0.00 - 0.07 K/uL    Comment: Performed at [facility], [place] [address], [place], [place] [address]  Magnesium     Status: None   Collection Time: 03/05/22 10:18 PM  Result Value Ref Range   Magnesium 1.7 1.7 - 2.4 mg/dL    Comment: Performed at [facility] [address] [place], [place] [address]  Phosphorus     Status: None   Collection Time: 03/05/22 10:18 PM  Result Value Ref Range   Phosphorus 2.7 2.5 - 4.6 mg/dL    Comment: Performed at [facility] [address] [place],  [address]  CBC with Differential/Platelet     Status: Abnormal   Collection Time: 03/06/22  5:33 AM  Result Value Ref Range   WBC 11.0 (H) 4.0 - 10.5 K/uL   RBC 3.66 (L) 3.87 - 5.11 MIL/uL   Hemoglobin 11.3 (L) 12.0 - 15.0 g/dL   HCT 33.0 (L) 36.0 - 46.0 %   MCV 90.2 80.0 - 100.0 fL   MCH 30.9 26.0 - 34.0  pg   MCHC 34.2 30.0 - 36.0 g/dL   RDW 15.6 (H) 11.5 - 15.5 %   Platelets 299 150 - 400 K/uL   nRBC 0.0 0.0 -  0.2 %   Neutrophils Relative % 81 %   Neutro Abs 8.9 (H) 1.7 - 7.7 K/uL   Lymphocytes Relative 8 %   Lymphs Abs 0.9 0.7 - 4.0 K/uL   Monocytes Relative 9 %   Monocytes Absolute 1.0 0.1 - 1.0 K/uL   Eosinophils Relative 1 %   Eosinophils Absolute 0.1 0.0 - 0.5 K/uL   Basophils Relative 0 %   Basophils Absolute 0.0 0.0 - 0.1 K/uL   Immature Granulocytes 1 %   Abs Immature Granulocytes 0.06 0.00 - 0.07 K/uL    Comment: Performed at [facility] [address] [place], [place] [address]  Comprehensive metabolic panel     Status: Abnormal   Collection Time: 03/06/22  5:33 AM  Result Value Ref Range   Sodium 128 (L) 135 - 145 mmol/L   Potassium 4.2 3.5 - 5.1 mmol/L   Chloride 96 (L) 98 - 111 mmol/L   CO2 24 22 - 32 mmol/L   Glucose, Bld 101 (H) 70 - 99 mg/dL    Comment: Glucose reference range applies only to samples taken after fasting for at least 8 hours.   BUN 16 8 - 23 mg/dL   Creatinine, Ser 0.49 0.44 - 1.00 mg/dL   Calcium 8.5 (L) 8.9 - 10.3 mg/dL   Total Protein 4.9 (L) 6.5 - 8.1 g/dL   Albumin 2.0 (L) 3.5 - 5.0 g/dL   AST 34 15 - 41 U/L   ALT 26 0 - 44 U/L   Alkaline Phosphatase 68 38 - 126 U/L   Total Bilirubin 0.9 0.3 - 1.2 mg/dL   GFR, Estimated >60 >60 mL/min    Comment: (NOTE) Calculated using the CKD-EPI Creatinine Equation (2021)    Anion gap 8 5 - 15    Comment: Performed at [facility], [place] [address] [place], [place] [address]  Magnesium     Status: None   Collection Time: 03/06/22  5:33 AM  Result Value Ref Range   Magnesium 1.7 1.7 - 2.4 mg/dL    Comment: Performed at [facility] [address] [place], [place] [address]   CT ABDOMEN PELVIS W CONTRAST  Result Date: 03/05/2022 CLINICAL DATA:  Complains of abdominal swelling. EXAM: CT ABDOMEN AND PELVIS WITH CONTRAST TECHNIQUE: Multidetector CT imaging of the abdomen and pelvis was performed using the standard protocol following bolus administration of intravenous contrast. RADIATION  DOSE REDUCTION: This exam was performed according to the departmental dose-optimization program which includes automated exposure control, adjustment of the mA and/or kV according to patient size and/or use of iterative reconstruction technique. CONTRAST:  20m OMNIPAQUE IOHEXOL 350 MG/ML SOLN COMPARISON:  02/24/2022 FINDINGS: Lower chest: Unchanged small left pleural effusion. Trace right effusion appears similar to previous exam. Peripheral tree-in-bud nodularity with areas of mucoid impaction again noted within both lung bases. Hepatobiliary: No suspicious liver lesion. Cholecystectomy. No biliary dilatation. Pancreas: Unremarkable. No pancreatic ductal dilatation or surrounding inflammatory changes. Spleen: Normal in size without focal abnormality. Adrenals/Urinary Tract: No adrenal mass. Simple appearing left kidney cyst measures 1.3 cm, image 30/3. No nephrolithiasis or hydronephrosis. Urinary bladder is unremarkable. Stomach/Bowel: Small to moderate hiatal hernia. Increase caliber of the proximal small bowel loops within the left hemiabdomen which measure up to 2.9 cm, image 47/3. Fecalization of  the prominent small bowel loops noted compatible with diminished bowel transit. There is persistent colonic distension. The cecum measures up to 9.3 cm compared with 7.8 cm previously. Colonic air-fluid levels are identified. Assessment for colonic wall thickening is decreased due to diffuse colonic distension. Transition to decreased caliber distal colon is noted at the level of the mid sigmoid colon in the left hemipelvis, image 64/3. Signs of pelvic floor laxity identified with evidence of small rectocele. No signs pneumatosis or bowel perforation. Vascular/Lymphatic: Aortic atherosclerosis. Unchanged splenic artery aneurysm measuring 1.5 cm, image 20/3. No signs of abdominopelvic adenopathy. Reproductive: Status post hysterectomy. No adnexal masses. Other: A trace amount of free fluid is noted within the  dependent portion of the pelvis, image 69/3. No free air identified. Musculoskeletal: No acute or significant osseous findings. There is diffuse subcutaneous edema involving the abdomen and visualized portions of the lower extremity. IMPRESSION: 1. Persistent and mildly progressive colonic distension with air-fluid levels. Transition to decreased caliber distal colon at the level of the mid sigmoid colon in the left hemipelvis. Increase caliber of the proximal small bowel loops within the left hemiabdomen which measure up to 2.9 cm. Fecalization of the prominent small bowel loops noted compatible with diminished bowel transit. Etiology of the persistent colonic distension is indeterminate. Cannot exclude toxic megacolon in this patient who has a reported history of C diff colitis. No signs of pneumatosis or bowel perforation at this time. 2. Small to moderate hiatal hernia. 3. Unchanged small left pleural effusion. Trace right effusion appears similar to previous exam. 4. Peripheral tree-in-bud nodularity with areas of mucoid impaction again noted within both lung bases. Findings are favored to represent sequelae of chronic indolent atypical infection such as MAI. 5. Unchanged splenic artery aneurysm measuring 1.5 cm. 6. Signs of pelvic floor laxity with evidence of small rectocele. 7. Trace amount of free fluid is noted within the dependent portion of the pelvis. 8.  Aortic Atherosclerosis (ICD10-I70.0). Electronically Signed   By: Kerby Moors M.D.   On: 03/05/2022 19:32   DG Abdomen 1 View  Result Date: 03/05/2022 CLINICAL DATA:  Abdominal distention EXAM: ABDOMEN - 1 VIEW COMPARISON:  02/27/2022 FINDINGS: There is marked gaseous distention of right colon measuring up to 14.7 cm in diameter. Ascending colon measured 12.8 cm in the previous study. There is mild dilation of few small bowel loops measuring up to 3.5 cm. Stomach is not distended. There is lucency in the retrocardiac region, possibly suggesting  presence of small hiatal hernia. IMPRESSION: There is marked dilation of ascending colon with interval worsening. There is mild dilation of small-bowel loops. Findings may suggest ileus. If there are continued symptoms, follow-up CT may be considered. Electronically Signed   By: Elmer Picker M.D.   On: 03/05/2022 17:08   DG Chest 2 View  Result Date: 03/05/2022 CLINICAL DATA:  Cough EXAM: CHEST - 2 VIEW COMPARISON:  01/26/2022 FINDINGS: Cardiac size is within normal limits. Low position of diaphragms suggests COPD. There are no signs of alveolar pulmonary edema or focal pulmonary consolidation. There is interval appearance of small bilateral pleural effusions. There is no pneumothorax. IMPRESSION: Small bilateral pleural effusions. COPD. There are no signs of pulmonary edema or focal pulmonary consolidation. Electronically Signed   By: Elmer Picker M.D.   On: 03/05/2022 17:05    Pending Labs Unresulted Labs (From admission, onward)     Start     Ordered   03/07/22 4431  Basic metabolic panel  Tomorrow morning,  R        03/06/22 1305   03/07/22 0500  Magnesium  Tomorrow morning,   R        03/06/22 1305   03/07/22 0500  CBC with Differential/Platelet  Tomorrow morning,   R        03/06/22 1305   03/06/22 1342  Lactic acid, plasma  Add-on,   AD        03/06/22 1342   03/06/22 0723  Prealbumin  Add-on,   AD        03/06/22 0722   03/06/22 0721  Phosphorus  Add-on,   AD        03/06/22 0720            Vitals/Pain Today's Vitals   03/06/22 1200 03/06/22 1230 03/06/22 1300 03/06/22 1356  BP: 131/60 (!) 124/59 123/63   Pulse: 65 64 63   Resp: (!) '21 19 18   '$ Temp:    (!) 97.5 F (36.4 C)  TempSrc:    Oral  SpO2: 100% 98% 99%   Weight:      Height:      PainSc:        Isolation Precautions No active isolations  Medications Medications  acetaminophen (TYLENOL) tablet 650 mg (has no administration in time range)    Or  acetaminophen (TYLENOL) suppository 650 mg  (has no administration in time range)  ondansetron (ZOFRAN) injection 4 mg (has no administration in time range)  naloxone (NARCAN) injection 0.4 mg (has no administration in time range)  fentaNYL (SUBLIMAZE) injection 50 mcg (50 mcg Intravenous Given 03/06/22 0550)  dextrose 5 %-0.9 % sodium chloride infusion ( Intravenous New Bag/Given 03/06/22 1545)  famotidine (PEPCID) IVPB 20 mg premix (0 mg Intravenous Stopped 03/06/22 1544)  metroNIDAZOLE (FLAGYL) IVPB 500 mg (0 mg Intravenous Stopped 03/06/22 1545)  metoprolol tartrate (LOPRESSOR) injection 5 mg (has no administration in time range)  vancomycin (VANCOCIN) capsule 125 mg (has no administration in time range)  iohexol (OMNIPAQUE) 350 MG/ML injection 75 mL (75 mLs Intravenous Contrast Given 03/05/22 1826)  magnesium sulfate IVPB 2 g 50 mL (0 g Intravenous Stopped 03/06/22 1544)    Mobility walks with device Moderate fall risk   Focused Assessments     R Recommendations: See Admitting Provider Note  Report given to:   Additional Notes:

## 2022-03-06 NOTE — Assessment & Plan Note (Signed)
Continue close follow up on cell count

## 2022-03-07 ENCOUNTER — Inpatient Hospital Stay (HOSPITAL_COMMUNITY): Payer: Medicare Other

## 2022-03-07 ENCOUNTER — Inpatient Hospital Stay: Payer: Medicare Other | Admitting: Family

## 2022-03-07 DIAGNOSIS — E43 Unspecified severe protein-calorie malnutrition: Secondary | ICD-10-CM | POA: Diagnosis not present

## 2022-03-07 DIAGNOSIS — R935 Abnormal findings on diagnostic imaging of other abdominal regions, including retroperitoneum: Secondary | ICD-10-CM

## 2022-03-07 DIAGNOSIS — K6389 Other specified diseases of intestine: Secondary | ICD-10-CM | POA: Diagnosis not present

## 2022-03-07 DIAGNOSIS — I480 Paroxysmal atrial fibrillation: Secondary | ICD-10-CM | POA: Diagnosis not present

## 2022-03-07 DIAGNOSIS — K567 Ileus, unspecified: Secondary | ICD-10-CM | POA: Diagnosis not present

## 2022-03-07 DIAGNOSIS — E860 Dehydration: Secondary | ICD-10-CM | POA: Diagnosis not present

## 2022-03-07 DIAGNOSIS — A498 Other bacterial infections of unspecified site: Secondary | ICD-10-CM | POA: Diagnosis not present

## 2022-03-07 DIAGNOSIS — A0472 Enterocolitis due to Clostridium difficile, not specified as recurrent: Secondary | ICD-10-CM | POA: Diagnosis not present

## 2022-03-07 LAB — BASIC METABOLIC PANEL
Anion gap: 7 (ref 5–15)
BUN: 16 mg/dL (ref 8–23)
CO2: 26 mmol/L (ref 22–32)
Calcium: 8.5 mg/dL — ABNORMAL LOW (ref 8.9–10.3)
Chloride: 98 mmol/L (ref 98–111)
Creatinine, Ser: 0.53 mg/dL (ref 0.44–1.00)
GFR, Estimated: >60 mL/min (ref >60)
Glucose, Bld: 109 mg/dL — ABNORMAL HIGH (ref 70–99)
Potassium: 4.3 mmol/L (ref 3.5–5.1)
Sodium: 131 mmol/L — ABNORMAL LOW (ref 135–145)

## 2022-03-07 LAB — CBC WITH DIFFERENTIAL/PLATELET
Abs Immature Granulocytes: 0.02 10*3/uL (ref 0.00–0.07)
Basophils Absolute: 0.1 10*3/uL (ref 0.0–0.1)
Basophils Relative: 1 %
Eosinophils Absolute: 0.1 10*3/uL (ref 0.0–0.5)
Eosinophils Relative: 1 %
HCT: 32.4 % — ABNORMAL LOW (ref 36.0–46.0)
Hemoglobin: 10.7 g/dL — ABNORMAL LOW (ref 12.0–15.0)
Immature Granulocytes: 0 %
Lymphocytes Relative: 7 %
Lymphs Abs: 0.6 10*3/uL — ABNORMAL LOW (ref 0.7–4.0)
MCH: 30 pg (ref 26.0–34.0)
MCHC: 33 g/dL (ref 30.0–36.0)
MCV: 90.8 fL (ref 80.0–100.0)
Monocytes Absolute: 1 10*3/uL (ref 0.1–1.0)
Monocytes Relative: 12 %
Neutro Abs: 6.4 10*3/uL (ref 1.7–7.7)
Neutrophils Relative %: 79 %
Platelets: 328 10*3/uL (ref 150–400)
RBC: 3.57 MIL/uL — ABNORMAL LOW (ref 3.87–5.11)
RDW: 15.6 % — ABNORMAL HIGH (ref 11.5–15.5)
WBC: 8.1 10*3/uL (ref 4.0–10.5)
nRBC: 0 % (ref 0.0–0.2)

## 2022-03-07 LAB — URINE CULTURE: Culture: NO GROWTH

## 2022-03-07 LAB — MAGNESIUM: Magnesium: 2 mg/dL (ref 1.7–2.4)

## 2022-03-07 MED ORDER — ENOXAPARIN SODIUM 40 MG/0.4ML IJ SOSY
40.0000 mg | PREFILLED_SYRINGE | Freq: Two times a day (BID) | INTRAMUSCULAR | Status: DC
  Administered 2022-03-07 – 2022-03-09 (×4): 40 mg via SUBCUTANEOUS
  Filled 2022-03-07 (×3): qty 0.4

## 2022-03-07 NOTE — Progress Notes (Signed)
Initial Nutrition Assessment  DOCUMENTATION CODES:   Severe malnutrition in context of chronic illness, Underweight  INTERVENTION:  If unable to advance diet, recommend initiation of TPN; discussed with GI Pt is at high refeeding risk Once diet able to be advanced, recommend: ST consult to ensure pt on properly modified diet d/t pt report of improper fitting dentures Banatrol BID-provides 45kcal, 5g soluble fiber and 2g protein per serving given ongoing diarrhea Check micronutrient labs: Vitamin C, Vitamin D, zinc, thiamine, folate "High Calorie, High Protein Nutrition Therapy" handout added to AVS for home nutrition recommendations to increase nutritional adequacy  NUTRITION DIAGNOSIS:   Severe Malnutrition related to chronic illness (recurrent c. diff) as evidenced by severe fat depletion, severe muscle depletion.  GOAL:   Patient will meet greater than or equal to 90% of their needs  MONITOR:   Diet advancement, Weight trends, Labs  REASON FOR ASSESSMENT:   Consult Other (Comment) (Assistance with nutritional recommendations for optimization in the setting of appearance of protein calorie malnutrition)  ASSESSMENT:   Pt admitted with dehydration d/t diminished oral intake, found to have ileus. PMH significant for dementia, recurrent C. Diff requiring multiple prior admissions, paroxysmal afib, chronic hyponatremia, anemia of chronic disease.  Spoke with pt and her husband at bedside. Pt familiar to RD from prior admission.   Pt has remained NPO since 11/6. Prior to that she reports that she was not tolerating much PO intake since discharge from most recent admission. It is unclear how she had been eating prior to that. She endorses lactose intolerance and a nut allergy. Nut allergy not listed in Epic, added to chart.   Pt reports swallowing difficulty secondary to chewing difficulty. She reports that her dentures have not been fitting properly. She was suppose to follow up  with her dentist but has been able to d/t recurrent admission.   She reports that she started a weight loss plan with her husband a couple years ago. She states that her usual weight was about 109 lbs but has since lost 9 lbs. She wishes to gain about 20 lbs back.   Current bed weight is 41.3 kg, however pt noted to have deep pitting edema. Suspect this is likely not her true dry weight. Will continue to monitor trends throughout admission.  Given her severe malnutrition and inability to maintain adequate PO intake, in addition to NPO status. Would recommend initiation of TPN. Discussed with GI. They are considering TPN versus trickle tube feeds given pt is not currently experiencing n/v and is having bowel function. Abdominal distension has improved since admission.    Medications: abx, IV pepcid  Labs: sodium 131  NUTRITION - FOCUSED PHYSICAL EXAM:  Flowsheet Row Most Recent Value  Orbital Region Severe depletion  Upper Arm Region Severe depletion  Thoracic and Lumbar Region Severe depletion  Buccal Region Severe depletion  Temple Region Severe depletion  Clavicle Bone Region Severe depletion  Clavicle and Acromion Bone Region Severe depletion  Scapular Bone Region Severe depletion  Dorsal Hand Severe depletion  Patellar Region Severe depletion  Anterior Thigh Region Severe depletion  Posterior Calf Region Severe depletion  Edema (RD Assessment) Severe  [deep pitting BLE]  Hair Reviewed  Eyes Reviewed  Mouth Reviewed  Skin Reviewed  Nails Reviewed       Diet Order:   Diet Order             Diet NPO time specified  Diet effective now  EDUCATION NEEDS:   Education needs have been addressed  Skin:  Skin Assessment: Reviewed RN Assessment  Last BM:  11/8 (type 7)  Height:   Ht Readings from Last 1 Encounters:  03/05/22 '5\' 2"'$  (1.575 m)    Weight:   Wt Readings from Last 1 Encounters:  03/05/22 41.3 kg   BMI:  Body mass index is 16.64  kg/m.  Estimated Nutritional Needs:   Kcal:  1300-1500  Protein:  65-80g  Fluid:  1.3-1.5L  Clayborne Dana, RDN, LDN Clinical Nutrition

## 2022-03-07 NOTE — Progress Notes (Signed)
ANTICOAGULATION CONSULT NOTE  Pharmacy Consult for enoxaparin Indication: atrial fibrillation  Allergies  Allergen Reactions   Lactose Intolerance (Gi) Other (See Comments)    Intolerance    Lovastatin Other (See Comments)    Unknown reaction   Mometasone Furo-Formoterol Fum Other (See Comments)    Loss of appetite, laryngitis    Peanut-Containing Drug Products Other (See Comments)   Prozac [Fluoxetine Hcl] Other (See Comments)    Jumpy   Sulfa Antibiotics Other (See Comments)    Unknown reaction    Patient Measurements: Height: '5\' 2"'$  (157.5 cm) Weight: 41.3 kg (91 lb) IBW/kg (Calculated) : 50.1  Vital Signs:    Labs: Recent Labs    03/05/22 1607 03/06/22 0533 03/07/22 0303  HGB 11.4* 11.3* 10.7*  HCT 34.0* 33.0* 32.4*  PLT 356 299 328  CREATININE 0.49 0.49 0.53    Estimated Creatinine Clearance: 34.7 mL/min (by C-G formula based on SCr of 0.53 mg/dL).   Medical History: Past Medical History:  Diagnosis Date   Adenomatous polyp of colon 2020   Allergy    Anxiety    Basal cell carcinoma (BCC)    Cataract    Depression    Helicobacter pylori gastritis    Osteopenia    Paroxysmal atrial fibrillation (HCC)    Pneumonia    hx of 03/2008   Stroke [facility])    TIA long time ago     Assessment: 67 yoF admitted with ileus and poor PO intake. Pt on apixaban at home for hx PAF - last dose 11/7 ~1100. Pharmacy asked to dose enoxaparin while unable to give PO meds reliably. CBC ok, Cr at BL <1.  Goal of Therapy:  Anti-Xa level 0.6-1 units/ml 4hrs after LMWH dose given Monitor platelets by anticoagulation protocol: Yes   Plan:  Enoxaparin '40mg'$  (~'1mg'$ /kg) q12h

## 2022-03-07 NOTE — Progress Notes (Addendum)
[facility] PROGRESS NOTE  Julie Montgomery (DOB: 19-Aug-1935) RUE:[id number] PCP: Cassandria Anger, MD  Brief Narrative: Julie Montgomery is an 86 y.o. female with a history of dementia, PAF, recurrent C diff, and chronic hyponatremia who presented 11/6 with decreased oral intake. Recent hospitalization from 10/25 to 02/28/22 for recurrent C diff colitis. She was discharge on oral vancomycin taper. At home her diarrhea was improving, taking imodium, though oral intake was very poor. She appeared dehydrated on exam with abdominal tenderness.    Na 129, K 4,3 CL 94 bicarbonate 23, glucose 104 bun 16 cr 0,49  Wbc 13,5 hgb 11,4 plt 356    Abdominal radiograph with marked dilatation of the ascending colon with interval worsening. Mild dilatation of the small bowel loops. Findings consistent with ileus.    CT abdomen and pelvis with persistent and mildly progressive colonic distention with air fluid levels, decreased caliber distal colon at the level of the mid sigmoid colon and increase caliber proximal small bowel loops with the left hemi abdomen. Fecalization of the prominent small bowel loops.    Chest radiograph with hyperinflation and small left pleural effusion, no infiltrates.   Subjective: Abdominal pain is less than previously per pt and softer, less distended per RN staff. Has been unable to void and retaining requiring I&O x3 in past 24 hours. No new complaints.   Objective: BP 115/64 (BP Location: Left Arm)   Pulse 71   Temp 98.2 F (36.8 C) (Oral)   Resp 19   Ht '5\' 2"'$  (1.575 m)   Wt 41.3 kg   SpO2 96%   BMI 16.64 kg/m   Gen: Elderly, frail female in no distress Pulm: Clear, nonlabored  CV: RRR, +LE edema GI: Soft, diffusely tender without rebound or guarding, mild-moderate distention, +BS. Neuro: Alert and cooperative, responsive. No new focal deficits. Ext: Warm, no deformities Skin: No acute rashes, lesions or ulcers on visualized skin   Assessment &  Plan: Ileus: Favored Dx in setting of imodium over toxic megacolon at this time with relatively modest leukocytosis (normalized as of 11/8), nontoxic appearance and exam.  - Symptoms improving (having BMs) and abdominal exam improved while holding imodium.  - Continue NPO, supportive therapy for now, defer to GI - Repeat XR.     Recurrent Clostridioides difficile infection:  - Continue IV flagyl, po vancomycin QID. Requested ID input. Appreciate their recommendations.  - Work up pending. - Patient will benefit from outpatient FMT at tertiary care center after discharge. - Contact isolation  Acute urinary retention, pyuria:  - Insert foley, consider TOV once more clinically stable.  - Urine culture pending.    Dehydration, hypovolemic hyponatremia:  - Continue isotonic saline while NPO, improving.   Hypomagnesemia: Resolved - Monitor  PAF: Patient not able to take PO meds due to ileus. - Continue cardiac monitoring, IV metoprolol prn. - Given her risk factors and hx TIA, will restart anticoagulation with lovenox pending ability to tolerate po.  - Once taking po, restart home diltiazem, eliquis  Dementia:  - Continue namenda.    Anemia of chronic disease: - No evidence of acute bleeding, monitor intermittently.   Severe protein calorie malnutrition:  - RD consulted, supplement protein as much as possible.   [name], MD [facility] [email or web address] 03/07/2022, 4:51 PM

## 2022-03-07 NOTE — Progress Notes (Signed)
Daily Progress Note   Patient Name: Julie Montgomery       Date: 03/07/2022 DOB: March 01, 1936  Age: 86 y.o. MRN#: [id number] Attending Physician: Julie Pour, MD Primary Care Physician: Julie Anger, MD Admit Date: 03/05/2022  Reason for Consultation/Follow-up: Establishing goals of care  Subjective: Medical records reviewed including progress notes, labs, imaging. Patient assessed at the bedside.  She is feeling a little better today, still uncomfortable with the amount of distention of her abdomen.  No family present during my visit.  Created space and opportunity for patient's thoughts and feelings on her current illness.  She is very happy that surgery is not on the table at the moment, while at the same time lamenting that she has been dealing with this for several weeks and "nothing has been done about it."  Provided education and counseling on the current steps and treatments being attempted.  She is interested in anything available to prolong her life including fecal transplant.  She states she was upset after our conversation yesterday and the detailed review of everything that could happen.  She still feels that "palliative care scares me" but she understands the reasoning for this extra layer of support.  She is appreciative of the kindness and extra time to ensure she understands her options, while again stating that "it is not up to me, I am trusting your team to tell me what to do in order to live."  I then called patient's son Julie Montgomery for palliative support.  He tells me that he and patient's husband have a good understanding of the current care plan after discussing with different specialties today.  He is relieved that patient is improving slowly.  Questions and concerns  addressed. PMT will continue to support holistically.   Length of Stay: 1  Physical Exam Vitals and nursing note reviewed.  Constitutional:      General: She is not in acute distress.    Appearance: She is cachectic. She is ill-appearing.  Cardiovascular:     Rate and Rhythm: Normal rate.  Pulmonary:     Effort: Pulmonary effort is normal.  Abdominal:     General: There is distension.  Skin:    General: Skin is warm and dry.  Neurological:     Mental Status: She is alert.  Psychiatric:  Behavior: Behavior is cooperative.            Vital Signs: BP 115/64 (BP Location: Left Arm)   Pulse 71   Temp 98.2 F (36.8 C) (Oral)   Resp 19   Ht '5\' 2"'$  (1.575 m)   Wt 41.3 kg   SpO2 96%   BMI 16.64 kg/m  SpO2: SpO2: 96 % O2 Device: O2 Device: Room Air O2 Flow Rate:        Palliative Assessment/Data:   Palliative Care Assessment & Plan   Patient Profile: 86 y.o. female  with past medical history of  dementia, recurrent C. difficile, paroxysmal atrial fibrillation chronically anticoagulated on Eliquis, chronic hyponatremia, anemia of chronic disease associated baseline hemoglobin range 11-13, admitted on 03/05/2022 with poor oral intake and dehydration, diarrhea.    Patient has been hospitalized several times for recurrent C. difficile infections (9/14-9/16, 10/10-10/16, 10/26-11/1).  She is currently admitted again for AKI, hypercalcemia, ongoing management of C. difficile infection.  In the ED, CT of the abdomen and pelvis showed mildly progressive colonic distention with air-fluid levels and diminished bowel transit concerning for toxic megacolon, no signs of pneumatosis or bowel perforation.  Patient is a poor candidate for endoscopic or surgical intervention.   PMT has been consulted to assist with goals of care conversation.  Assessment: Goals of care conversation Recurrent C. difficile Colonic ileus  Recommendations/Plan: Continue full code/full scope  treatment Goals are clear for aggressive life-prolonging interventions Ongoing goals of care conversations pending clinical course Psychosocial emotional support provided PMT will continue to follow and support   Prognosis:  Unable to determine  Discharge Planning: To Be Determined  Care plan was discussed with patient, patient's son   MDM high         Julie Montgomery Julie Litter, PA-C  Palliative Medicine Team Team phone # [phone]  Thank you for allowing the Palliative Medicine Team to assist in the care of this patient. Please utilize secure chat with additional questions, if there is no response within 30 minutes please call the above phone number.  Palliative Medicine Team providers are available by phone from 7am to 7pm daily and can be reached through the team cell phone.  Should this patient require assistance outside of these hours, please call the patient's attending physician.

## 2022-03-07 NOTE — Progress Notes (Addendum)
Progress Note  Primary GI: Dr. Fuller Plan   Subjective  Chief Complaint: Possible ileus versus toxic megacolon and setting of recurrent C. difficile  Husband in the room with patient at this time which is helpful as patient is poor historian. Been having abdominal distention for least a week, started having bilateral leg swelling 3 to 4 days ago with weeping, blisters. Patient denies nausea, vomiting, fever or chills. Had liquid stools with no solid pieces this morning at 8 AM, no hematochezia no melena. Abdomen is much softer, still some discomfort.    Objective   Vital signs in last 24 hours: Temp:  [97.5 F (36.4 C)-98.2 F (36.8 C)] 98.2 F (36.8 C) (11/08 0022) Pulse Rate:  [63-82] 71 (11/08 0022) Resp:  [15-21] 19 (11/08 0022) BP: (103-157)/(55-83) 115/64 (11/08 0022) SpO2:  [95 %-100 %] 96 % (11/08 0022) Last BM Date : 03/07/22 Last BM recorded by nurses in past 5 days Stool Type: Type 7 (Liquid consistency with no solid pieces) (03/07/2022  8:00 AM)  General: Cachectic female in no acute distress  Heart:  Regular rate and rhythm; no murmurs Pulm: Clear anteriorly; no wheezing Abdomen: Distended but much softer than yesterday, improved bowel sounds still decreased. mild tenderness in the entire abdomen. Without guarding and Without rebound, No organomegaly appreciated. Extremities:  with 2-3+ edema up to mid thigh, some blisters/weeping lower bilateral feet, left greater than right,no warmth, erythema.  Neurologic:  Alert and  oriented x4;  No focal deficits.  Psych:  Cooperative. Normal mood and affect.  Intake/Output from previous day: 11/07 0701 - 11/08 0700 In: 408.5 [I.V.:219.6; IV Piggyback:188.9] Out: 650 [Urine:650] Intake/Output this shift: No intake/output data recorded.  Studies/Results: DG Abd Portable 1V  Result Date: 03/07/2022 CLINICAL DATA:  Provided history: Ileus. EXAM: PORTABLE ABDOMEN - 1 VIEW COMPARISON:  CT abdomen/pelvis 03/05/2022. Prior  abdominal radiographs 03/05/2022 and earlier. FINDINGS: Persistent prominent air distention of the colon. Most notably, the transverse colon measures 12.5 cm in diameter, slightly decreased from the abdominal radiographs of 03/05/2022 (measuring 14.7 cm in diameter on this prior exam). No definite dilated small bowel loops are appreciated. Evaluation for intraperitoneal free air is limited on supine radiography. Known hiatal hernia. Lumbar spondylosis. IMPRESSION: Persistent prominent air distention of the colon. Most notably, the transverse colon measures 12.5 cm in diameter (slightly decreased from the prior abdominal radiographs of 03/05/2022). No definite dilated small bowel loops are identified. Known hiatal hernia. Electronically Signed   By: Kellie Simmering D.O.   On: 03/07/2022 11:17   CT ABDOMEN PELVIS W CONTRAST  Result Date: 03/05/2022 CLINICAL DATA:  Complains of abdominal swelling. EXAM: CT ABDOMEN AND PELVIS WITH CONTRAST TECHNIQUE: Multidetector CT imaging of the abdomen and pelvis was performed using the standard protocol following bolus administration of intravenous contrast. RADIATION DOSE REDUCTION: This exam was performed according to the departmental dose-optimization program which includes automated exposure control, adjustment of the mA and/or kV according to patient size and/or use of iterative reconstruction technique. CONTRAST:  41m OMNIPAQUE IOHEXOL 350 MG/ML SOLN COMPARISON:  02/24/2022 FINDINGS: Lower chest: Unchanged small left pleural effusion. Trace right effusion appears similar to previous exam. Peripheral tree-in-bud nodularity with areas of mucoid impaction again noted within both lung bases. Hepatobiliary: No suspicious liver lesion. Cholecystectomy. No biliary dilatation. Pancreas: Unremarkable. No pancreatic ductal dilatation or surrounding inflammatory changes. Spleen: Normal in size without focal abnormality. Adrenals/Urinary Tract: No adrenal mass. Simple appearing left  kidney cyst measures 1.3 cm, image 30/3. No nephrolithiasis or  hydronephrosis. Urinary bladder is unremarkable. Stomach/Bowel: Small to moderate hiatal hernia. Increase caliber of the proximal small bowel loops within the left hemiabdomen which measure up to 2.9 cm, image 47/3. Fecalization of the prominent small bowel loops noted compatible with diminished bowel transit. There is persistent colonic distension. The cecum measures up to 9.3 cm compared with 7.8 cm previously. Colonic air-fluid levels are identified. Assessment for colonic wall thickening is decreased due to diffuse colonic distension. Transition to decreased caliber distal colon is noted at the level of the mid sigmoid colon in the left hemipelvis, image 64/3. Signs of pelvic floor laxity identified with evidence of small rectocele. No signs pneumatosis or bowel perforation. Vascular/Lymphatic: Aortic atherosclerosis. Unchanged splenic artery aneurysm measuring 1.5 cm, image 20/3. No signs of abdominopelvic adenopathy. Reproductive: Status post hysterectomy. No adnexal masses. Other: A trace amount of free fluid is noted within the dependent portion of the pelvis, image 69/3. No free air identified. Musculoskeletal: No acute or significant osseous findings. There is diffuse subcutaneous edema involving the abdomen and visualized portions of the lower extremity. IMPRESSION: 1. Persistent and mildly progressive colonic distension with air-fluid levels. Transition to decreased caliber distal colon at the level of the mid sigmoid colon in the left hemipelvis. Increase caliber of the proximal small bowel loops within the left hemiabdomen which measure up to 2.9 cm. Fecalization of the prominent small bowel loops noted compatible with diminished bowel transit. Etiology of the persistent colonic distension is indeterminate. Cannot exclude toxic megacolon in this patient who has a reported history of C diff colitis. No signs of pneumatosis or bowel  perforation at this time. 2. Small to moderate hiatal hernia. 3. Unchanged small left pleural effusion. Trace right effusion appears similar to previous exam. 4. Peripheral tree-in-bud nodularity with areas of mucoid impaction again noted within both lung bases. Findings are favored to represent sequelae of chronic indolent atypical infection such as MAI. 5. Unchanged splenic artery aneurysm measuring 1.5 cm. 6. Signs of pelvic floor laxity with evidence of small rectocele. 7. Trace amount of free fluid is noted within the dependent portion of the pelvis. 8.  Aortic Atherosclerosis (ICD10-I70.0). Electronically Signed   By: Kerby Moors M.D.   On: 03/05/2022 19:32   DG Abdomen 1 View  Result Date: 03/05/2022 CLINICAL DATA:  Abdominal distention EXAM: ABDOMEN - 1 VIEW COMPARISON:  02/27/2022 FINDINGS: There is marked gaseous distention of right colon measuring up to 14.7 cm in diameter. Ascending colon measured 12.8 cm in the previous study. There is mild dilation of few small bowel loops measuring up to 3.5 cm. Stomach is not distended. There is lucency in the retrocardiac region, possibly suggesting presence of small hiatal hernia. IMPRESSION: There is marked dilation of ascending colon with interval worsening. There is mild dilation of small-bowel loops. Findings may suggest ileus. If there are continued symptoms, follow-up CT may be considered. Electronically Signed   By: Elmer Picker M.D.   On: 03/05/2022 17:08   DG Chest 2 View  Result Date: 03/05/2022 CLINICAL DATA:  Cough EXAM: CHEST - 2 VIEW COMPARISON:  01/26/2022 FINDINGS: Cardiac size is within normal limits. Low position of diaphragms suggests COPD. There are no signs of alveolar pulmonary edema or focal pulmonary consolidation. There is interval appearance of small bilateral pleural effusions. There is no pneumothorax. IMPRESSION: Small bilateral pleural effusions. COPD. There are no signs of pulmonary edema or focal pulmonary  consolidation. Electronically Signed   By: Elmer Picker M.D.   On: 03/05/2022 17:05  Lab Results: Recent Labs    03/05/22 1607 03/06/22 0533 03/07/22 0303  WBC 13.5* 11.0* 8.1  HGB 11.4* 11.3* 10.7*  HCT 34.0* 33.0* 32.4*  PLT 356 299 328   BMET Recent Labs    03/05/22 1607 03/06/22 0533 03/07/22 0303  NA 129* 128* 131*  K 4.3 4.2 4.3  CL 94* 96* 98  CO2 '23 24 26  '$ GLUCOSE 104* 101* 109*  BUN '16 16 16  '$ CREATININE 0.49 0.49 0.53  CALCIUM 9.0 8.5* 8.5*   LFT Recent Labs    03/06/22 0533  PROT 4.9*  ALBUMIN 2.0*  AST 34  ALT 26  ALKPHOS 68  BILITOT 0.9   PT/INR No results for input(s): "LABPROT", "INR" in the last 72 hours.   Scheduled Meds:  vancomycin  125 mg Oral Q6H   Continuous Infusions:  dextrose 5 % and 0.9% NaCl 50 mL/hr at 03/07/22 0508   famotidine (PEPCID) IV 20 mg (03/07/22 1030)   metronidazole 500 mg (03/07/22 0507)      Patient profile:   86 y.o. female with past medical history significant for prior TIA, osteopenia, prior history of H. pylori gastritis, adenomatous colon polyps, bronchiectasis, and A-fib for which she is on Eliquis and history of multiple episodes of recurrent C. difficile colitis   Impression/Plan:   Recurrent C. Difficile Did not complete vancomycin taper On vancomycin p.o. and Flagyl IV ID consult note pending, last testing 10/26 PCR negative, toxin positive If patient has recurrent diarrhea recheck PCR and toxin. Would potentially benefit from [place] outpatient Last Colon 2020, consider repeat colonoscopy in the future   Likely colonic ileus in setting of recurrent Cdiff Persistent prominent air distention of the colon most notably transverse colon 12.5 cm slightly decreased from prior x-ray 03/05/2022 Patient having bowel movements this morning, most likely this represents ileus secondary to Imodium use. Continue bowel rest-consider core track/enteral feedings versus TPN if prolonged n.p.o. in setting of  anasarca and low protein. -Continue to maintain magnesium above 2 and potassium at 4-4.5.  -Encourage early ambulation. -Minimize narcotics and anticholinergic medications With progression of diarrhea and abdomen softer today, likely patient might need endoscopic decompression. We will continue to monitor with antibiotics/IVF and further time away from Imodium. Repeat KUB/abdominal portable tomorrow   Protein calorie malnutrition Albumin 03/06/2022  2.0  BMI body mass index is 16.64 kg/m.  Secondary to poor PO intake/diarrhea  Anasarca/lower leg edema with weeping Likely secondary to protein calorie malnutrition Consider wound consult for leg ulcers/weeping [name] had prolonged hospital stays, has had significant weight loss, would likely benefit from possible enteral/trickle feeds versus TPN, will discuss with Dr. Henrene Pastor.   Anemia of  chronic disease Without any GI bleeding   Paroxysmal atrial fibrillation Eliquis on hold,? Able to restart    Principal Problem:   Ileus ([place]) Active Problems:   Goals of care, counseling/discussion   Dehydration   Protein calorie malnutrition ([facility])   Paroxysmal atrial fibrillation ([facility])   Recurrent Clostridioides difficile infection   Anemia of chronic disease    LOS: 1 day   Vladimir Crofts  03/07/2022, 11:35 AM  GI ATTENDING  Interval history data reviewed.  Patient seen and examined.  Agree with interval progress note.  Patient with Clostridium Dificid related diarrhea, and protein calorie malnutrition.  Admitted with abdominal distention.  Physical exam improved from yesterday though still distended.  X-ray proved from yesterday though still abnormal.  Appreciate help from ID and surgical opinion.  Nothing new to add at  this point.  Avoid narcotics and antidiarrheals.  Try to increase activities much as possible.  Continue to monitor electrolytes and correct if abnormal.  Repeat x-rays in a.m.  We will follow.  Docia Chuck. Geri Seminole.,  M.D. [facility] of Gastroenterology

## 2022-03-07 NOTE — Progress Notes (Signed)
Mobility Specialist - Progress Note   03/07/22 0954  Mobility  Activity Contraindicated/medical hold   RN advised. Will continue to monitor throughout stay.   Larey Seat

## 2022-03-07 NOTE — Progress Notes (Signed)
Subjective: Patient feeling a bit better today.  Starting having BMs again this morning as imodium seems to be wearing off.  Less abdominal pain.  ROS: See above, otherwise other systems negative  Objective: Vital signs in last 24 hours: Temp:  [97.5 F (36.4 C)-98.2 F (36.8 C)] 98.2 F (36.8 C) (11/08 0022) Pulse Rate:  [63-82] 71 (11/08 0022) Resp:  [15-23] 19 (11/08 0022) BP: (103-157)/(48-83) 115/64 (11/08 0022) SpO2:  [95 %-100 %] 96 % (11/08 0022) Last BM Date : 03/07/22  Intake/Output from previous day: 11/07 0701 - 11/08 0700 In: 408.5 [I.V.:219.6; IV Piggyback:188.9] Out: 650 [Urine:650] Intake/Output this shift: No intake/output data recorded.  PE: Gen: NAD, getting cleaned up Abd: softer than yesterday, but still with some distention, less tender  Lab Results:  Recent Labs    03/06/22 0533 03/07/22 0303  WBC 11.0* 8.1  HGB 11.3* 10.7*  HCT 33.0* 32.4*  PLT 299 328   BMET Recent Labs    03/06/22 0533 03/07/22 0303  NA 128* 131*  K 4.2 4.3  CL 96* 98  CO2 24 26  GLUCOSE 101* 109*  BUN 16 16  CREATININE 0.49 0.53  CALCIUM 8.5* 8.5*   PT/INR No results for input(s): "LABPROT", "INR" in the last 72 hours. CMP     Component Value Date/Time   NA 131 (L) 03/07/2022 0303   K 4.3 03/07/2022 0303   CL 98 03/07/2022 0303   CO2 26 03/07/2022 0303   GLUCOSE 109 (H) 03/07/2022 0303   GLUCOSE 99 05/14/2006 0941   BUN 16 03/07/2022 0303   CREATININE 0.53 03/07/2022 0303   CALCIUM 8.5 (L) 03/07/2022 0303   PROT 4.9 (L) 03/06/2022 0533   ALBUMIN 2.0 (L) 03/06/2022 0533   AST 34 03/06/2022 0533   ALT 26 03/06/2022 0533   ALKPHOS 68 03/06/2022 0533   BILITOT 0.9 03/06/2022 0533   GFRNONAA >60 03/07/2022 0303   GFRAA >60 07/02/2019 1151   Lipase     Component Value Date/Time   LIPASE 45 03/05/2022 1607       Studies/Results: CT ABDOMEN PELVIS W CONTRAST  Result Date: 03/05/2022 CLINICAL DATA:  Complains of abdominal swelling.  EXAM: CT ABDOMEN AND PELVIS WITH CONTRAST TECHNIQUE: Multidetector CT imaging of the abdomen and pelvis was performed using the standard protocol following bolus administration of intravenous contrast. RADIATION DOSE REDUCTION: This exam was performed according to the departmental dose-optimization program which includes automated exposure control, adjustment of the mA and/or kV according to patient size and/or use of iterative reconstruction technique. CONTRAST:  70m OMNIPAQUE IOHEXOL 350 MG/ML SOLN COMPARISON:  02/24/2022 FINDINGS: Lower chest: Unchanged small left pleural effusion. Trace right effusion appears similar to previous exam. Peripheral tree-in-bud nodularity with areas of mucoid impaction again noted within both lung bases. Hepatobiliary: No suspicious liver lesion. Cholecystectomy. No biliary dilatation. Pancreas: Unremarkable. No pancreatic ductal dilatation or surrounding inflammatory changes. Spleen: Normal in size without focal abnormality. Adrenals/Urinary Tract: No adrenal mass. Simple appearing left kidney cyst measures 1.3 cm, image 30/3. No nephrolithiasis or hydronephrosis. Urinary bladder is unremarkable. Stomach/Bowel: Small to moderate hiatal hernia. Increase caliber of the proximal small bowel loops within the left hemiabdomen which measure up to 2.9 cm, image 47/3. Fecalization of the prominent small bowel loops noted compatible with diminished bowel transit. There is persistent colonic distension. The cecum measures up to 9.3 cm compared with 7.8 cm previously. Colonic air-fluid levels are identified. Assessment for colonic wall thickening is decreased due to  diffuse colonic distension. Transition to decreased caliber distal colon is noted at the level of the mid sigmoid colon in the left hemipelvis, image 64/3. Signs of pelvic floor laxity identified with evidence of small rectocele. No signs pneumatosis or bowel perforation. Vascular/Lymphatic: Aortic atherosclerosis. Unchanged  splenic artery aneurysm measuring 1.5 cm, image 20/3. No signs of abdominopelvic adenopathy. Reproductive: Status post hysterectomy. No adnexal masses. Other: A trace amount of free fluid is noted within the dependent portion of the pelvis, image 69/3. No free air identified. Musculoskeletal: No acute or significant osseous findings. There is diffuse subcutaneous edema involving the abdomen and visualized portions of the lower extremity. IMPRESSION: 1. Persistent and mildly progressive colonic distension with air-fluid levels. Transition to decreased caliber distal colon at the level of the mid sigmoid colon in the left hemipelvis. Increase caliber of the proximal small bowel loops within the left hemiabdomen which measure up to 2.9 cm. Fecalization of the prominent small bowel loops noted compatible with diminished bowel transit. Etiology of the persistent colonic distension is indeterminate. Cannot exclude toxic megacolon in this patient who has a reported history of C diff colitis. No signs of pneumatosis or bowel perforation at this time. 2. Small to moderate hiatal hernia. 3. Unchanged small left pleural effusion. Trace right effusion appears similar to previous exam. 4. Peripheral tree-in-bud nodularity with areas of mucoid impaction again noted within both lung bases. Findings are favored to represent sequelae of chronic indolent atypical infection such as MAI. 5. Unchanged splenic artery aneurysm measuring 1.5 cm. 6. Signs of pelvic floor laxity with evidence of small rectocele. 7. Trace amount of free fluid is noted within the dependent portion of the pelvis. 8.  Aortic Atherosclerosis (ICD10-I70.0). Electronically Signed   By: Kerby Moors M.D.   On: 03/05/2022 19:32   DG Abdomen 1 View  Result Date: 03/05/2022 CLINICAL DATA:  Abdominal distention EXAM: ABDOMEN - 1 VIEW COMPARISON:  02/27/2022 FINDINGS: There is marked gaseous distention of right colon measuring up to 14.7 cm in diameter. Ascending  colon measured 12.8 cm in the previous study. There is mild dilation of few small bowel loops measuring up to 3.5 cm. Stomach is not distended. There is lucency in the retrocardiac region, possibly suggesting presence of small hiatal hernia. IMPRESSION: There is marked dilation of ascending colon with interval worsening. There is mild dilation of small-bowel loops. Findings may suggest ileus. If there are continued symptoms, follow-up CT may be considered. Electronically Signed   By: Elmer Picker M.D.   On: 03/05/2022 17:08   DG Chest 2 View  Result Date: 03/05/2022 CLINICAL DATA:  Cough EXAM: CHEST - 2 VIEW COMPARISON:  01/26/2022 FINDINGS: Cardiac size is within normal limits. Low position of diaphragms suggests COPD. There are no signs of alveolar pulmonary edema or focal pulmonary consolidation. There is interval appearance of small bilateral pleural effusions. There is no pneumothorax. IMPRESSION: Small bilateral pleural effusions. COPD. There are no signs of pulmonary edema or focal pulmonary consolidation. Electronically Signed   By: Elmer Picker M.D.   On: 03/05/2022 17:05    Anti-infectives: Anti-infectives (From admission, onward)    Start     Dose/Rate Route Frequency Ordered Stop   03/16/22 0800  vancomycin (VANCOCIN) capsule 125 mg  Status:  Discontinued       See Hyperspace for full Linked Orders Report.   125 mg Oral Daily with breakfast 03/06/22 0220 03/06/22 1240   03/09/22 0800  vancomycin (VANCOCIN) capsule 125 mg  Status:  Discontinued  See Hyperspace for full Linked Orders Report.   125 mg Oral 2 times daily with meals 03/06/22 0220 03/06/22 1240   03/06/22 1800  vancomycin (VANCOCIN) 500 mg in sodium chloride irrigation 0.9 % 100 mL ENEMA  Status:  Discontinued        500 mg Rectal Every 6 hours 03/06/22 1429 03/06/22 1528   03/06/22 1800  vancomycin (VANCOCIN) capsule 125 mg       Note to Pharmacy: 02/27/22 - 03/08/22: Take 1 capsule four times daily;  11/10- 11/16: take 1 capsule two times daily; 11/17 - 11/23: take 1 capsule once daily; from 11/24 and on, see other prescrption sent to [facility]     125 mg Oral Every 6 hours 03/06/22 1528 03/09/22 1759   03/06/22 1300  metroNIDAZOLE (FLAGYL) IVPB 500 mg        500 mg 100 mL/hr over 60 Minutes Intravenous Every 8 hours 03/06/22 1249     03/06/22 0800  vancomycin (VANCOCIN) capsule 125 mg  Status:  Discontinued       Note to Pharmacy: 02/27/22 - 03/08/22: Take 1 capsule four times daily; 11/10- 11/16: take 1 capsule two times daily; 11/17 - 11/23: take 1 capsule once daily; from 11/24 and on, see other prescrption sent to [facility]     125 mg Oral 3 times daily before meals & bedtime 03/06/22 0216 03/06/22 1240        Assessment/Plan Recurrent c diff colitis, Ileus, colonic atony -patient took imodium a couple of days ago with worsening abdominal distention, but seems to be wearing off and having return of bowel function.  Her abdomen is softer today, but still with distention. -follow up on plain film to assess for improvement. -no plans for surgical intervention. -defer to GI/ID/medicine regarding further plans for c diff treatment.   ID - IV flagyl, PO vancomycin VTE - SCDs, can likely resume eliquis as no plans for surgery FEN - IVF, NPO, diet per other services. Foley - none   Paroxysmal atrial fibrillation on eliquis  Prior TIA Prior history of H. pylori gastritis Protein calorie malnutrition Hyponatremia  I reviewed Consultant GI notes, hospitalist notes, last 24 h vitals and pain scores, last 48 h intake and output, last 24 h labs and trends, and last 24 h imaging results.   LOS: 1 day    Henreitta Cea , [facility] 03/07/2022, 10:34 AM Please see Amion for pager number during day hours 7:00am-4:30pm or 7:00am -11:30am on weekends

## 2022-03-07 NOTE — Consult Note (Signed)
[place] [facility]    Date of Admission:  03/05/2022   Total days of inpatient antibiotics 2        Reason for Consult: Recurrent C diff    Principal Problem:   Ileus ([place]) Active Problems:   Goals of care, counseling/discussion   Dehydration   Protein calorie malnutrition (HCC)   Paroxysmal atrial fibrillation (HCC)   Recurrent Clostridioides difficile infection   Anemia of chronic disease   Assessment: 53 YF admitted with:   #Hx of recurrent C diff #Concern for toxic megacolon #Dehydration -Pt admitted with dehydration. She reports that se had been taking imodium for the last 2 weeks.  -CT showed colonic distension with air-fluid levels, cannot exclude toxic megacolon -Surgery consulted noted plain film improvement, no plans for surgical intervention. DO not suspect toxic megacolon. I am in agreement that radiologic findings are in the setting of imodium.  -GI consulted due to risk of iatrogenic perforation, no placs for enodcopic prodedure.  -Of note C.diff testing has been Ag/PCR+ but toxin negative. I think following Vowst(if attaiinable) she would benefit from colonoscopy to look for other etiologies.  Recommendations:  -Continue Vancomycin PO qid for now -We are working on oral fecal transplant medication(Vowst).  -Would recommend colonoscopy down the line -C diff and GIP  I have personally spent 85 minutes involved in face-to-face and non-face-to-face activities for this patient on the day of the visit. Professional time spent includes the following activities: Preparing to see the patient (review of tests), Obtaining and/or reviewing separately obtained history (admission/discharge record), Performing a medically appropriate examination and/or evaluation , Ordering medications/tests/procedures, referring and communicating with other health care professionals, Documenting clinical information in the EMR, Independently interpreting results  (not separately reported), Communicating results to the patient/family/caregiver, Counseling and educating the patient/family/caregiver and Care coordination (not separately reported).   Microbiology:   Antibiotics: Vanomcyin Metronidazole  Cultures: Blood Vancomycin 11/7-    HPI: Julie Montgomery is a 86 y.o. female with history of depression/anxiety, TIA, osteopenia, basal cell carcinoma, paroxysmal A-fib, H. pylori, recurrent C. difficile infection with recent hospitalization 10/25 - 11/1 to [facility] for recurrent C. difficile discharged on vancomycin taper and recommend to be seen at at [facility] or [facility] for FMT .  She had previously been treated with Zinplava without much benefit as she is toxic negative. Admitted to [facility] 11/6 for dehydration and presented with diminished oral intake. CT showed colonic distension with air-fluids, cannot exclude toxic megacolon. ID engaged.   Review of Systems: Review of Systems  All other systems reviewed and are negative.   Past Medical History:  Diagnosis Date   Adenomatous polyp of colon 2020   Allergy    Anxiety    Basal cell carcinoma (BCC)    Cataract    Depression    Helicobacter pylori gastritis    Osteopenia    Paroxysmal atrial fibrillation (HCC)    Pneumonia    hx of 03/2008   Stroke [facility])    TIA long time ago    Social History   Tobacco Use   Smoking status: Never   Smokeless tobacco: Never  Vaping Use   Vaping Use: Never used  Substance Use Topics   Alcohol use: No   Drug use: No    Family History  Problem Relation Age of Onset   Diabetes Sister    Cancer Sister        lung ca   Coronary  artery disease Brother    Dementia Brother    Cancer Brother        lung ca   Stomach cancer Paternal Grandmother    Colon cancer Son    Breast cancer Neg Hx    Esophageal cancer Neg Hx    Rectal cancer Neg Hx    Scheduled Meds:  vancomycin  125 mg Oral Q6H   Continuous Infusions:  dextrose 5 % and 0.9% NaCl 50  mL/hr at 03/07/22 0508   famotidine (PEPCID) IV 20 mg (03/07/22 1030)   metronidazole 500 mg (03/07/22 0507)   PRN Meds:.acetaminophen **OR** acetaminophen, fentaNYL (SUBLIMAZE) injection, metoprolol tartrate, naLOXone (NARCAN)  injection, ondansetron (ZOFRAN) IV Allergies  Allergen Reactions   Lactose Intolerance (Gi) Other (See Comments)    Intolerance    Lovastatin Other (See Comments)    Unknown reaction   Mometasone Furo-Formoterol Fum Other (See Comments)    Loss of appetite, laryngitis    Prozac [Fluoxetine Hcl] Other (See Comments)    Jumpy   Sulfa Antibiotics Other (See Comments)    Unknown reaction    OBJECTIVE: Blood pressure 115/64, pulse 71, temperature 98.2 F (36.8 C), temperature source Oral, resp. rate 19, height '5\' 2"'$  (1.575 m), weight 41.3 kg, SpO2 96 %.  Physical Exam Constitutional:      Appearance: Normal appearance.  HENT:     Head: Normocephalic and atraumatic.     Right Ear: Tympanic membrane normal.     Left Ear: Tympanic membrane normal.     Nose: Nose normal.     Mouth/Throat:     Mouth: Mucous membranes are moist.  Eyes:     Extraocular Movements: Extraocular movements intact.     Conjunctiva/sclera: Conjunctivae normal.     Pupils: Pupils are equal, round, and reactive to light.  Cardiovascular:     Rate and Rhythm: Normal rate and regular rhythm.     Heart sounds: No murmur heard.    No friction rub. No gallop.  Pulmonary:     Effort: Pulmonary effort is normal.     Breath sounds: Normal breath sounds.  Abdominal:     General: Abdomen is flat.     Palpations: Abdomen is soft.  Musculoskeletal:        General: Normal range of motion.  Skin:    General: Skin is warm and dry.  Neurological:     General: No focal deficit present.     Mental Status: She is alert and oriented to person, place, and time.  Psychiatric:        Mood and Affect: Mood normal.     Lab Results Lab Results  Component Value Date   WBC 8.1 03/07/2022   HGB  10.7 (L) 03/07/2022   HCT 32.4 (L) 03/07/2022   MCV 90.8 03/07/2022   PLT 328 03/07/2022    Lab Results  Component Value Date   CREATININE 0.53 03/07/2022   BUN 16 03/07/2022   NA 131 (L) 03/07/2022   K 4.3 03/07/2022   CL 98 03/07/2022   CO2 26 03/07/2022    Lab Results  Component Value Date   ALT 26 03/06/2022   AST 34 03/06/2022   ALKPHOS 68 03/06/2022   BILITOT 0.9 03/06/2022       Laurice Record, [facility] 03/07/2022, 11:50 AM

## 2022-03-07 NOTE — Progress Notes (Signed)
This chaplain responded to PMT consult for spiritual care. The Pt. is awake and accepting of a chaplain visit. The Pt. husband-Don is at the bedside.  The family accepted the chaplain's invitation for story telling about their faith journey and many opportunities for service on local and international mission trips. The chaplain understands the family is connected well to their faith community in [place]. The Pt. accepted the chaplain's invitation for prayer and F/U spiritual care "if they are still here."   Chaplain Sallyanne Kuster [phone]

## 2022-03-07 NOTE — Plan of Care (Signed)

## 2022-03-07 NOTE — Discharge Instructions (Addendum)
Ileostomy Nutrition Therapy   During ileostomy surgery, the entire colon, rectum, and anus are removed or bypassed. The part of the small intestine called the ileum is brought through the abdominal wall, creating a stoma. The stoma is an opening in the abdomen that must be covered with a bag to collect stools at all times. It can be temporary or permanent depending on the surgery.  After surgery, your bowel will be swollen. Your eating plan will begin with a diet of clear liquids. As you recover, you will start eating solid foods, beginning with foods that are low in fiber (see Recommended Foods). You should avoid high-fiber foods because they are harder to digest. Avoiding high-fiber foods will allow the bowel to heal and prevent blockage of the ileostomy.  You should have less than 8 grams of fiber per day when you move from liquids to solid foods and then transition to less than 13 grams of fiber for the whole day in the next few days as your symptoms decrease. Most patients begin to eat more normally 6 weeks after surgery.    The changes to your diet recommended on this handout can help reduce symptoms such as diarrhea, odor, and gas; help you avoid a blockage; and help your body get more nutrients from your food as you heal from surgery.   Tips   You may not feel like eating much, so eat small amounts every 2 to 4 hours. Keep a regular schedule for meals and snacks to help reduce gas and help your body absorb nutrients from foods.  Let your health care provider know if you see whole foods or pills in your ostomy bag.  Do not use time-released, enteric-coated medications or very large tablets. Also avoid laxatives, which can cause dehydration.  Take a chewable (non-gummy) multivitamin with minerals daily.  Take a chewable or liquid calcium supplement. Liquid calcium citrate can be taken with food or without food.  Have your largest meal in the middle of the day. Do not eat large amounts in the  evening. This can help decrease stool output at night, so that you can limit emptying the ostomy bag then.  Eat foods that may thicken stool several times a day. Some foods can change the color of the stool. (See the Food Selection Guide.)   When you begin to add more variety back into your diet, add only 1 new food every few days. If there are foods that bothered you before surgery, add other foods first. Eat only a small amount when you re-try foods. If a food makes you feel sick, wait a few weeks and then re-try it. Keep a log of foods you try and how you feel when eating them.  To reduce gas, avoid chewing gum, drinking with straws and drinking carbonated beverages, smoking or chewing tobacco, eating too fast, and skipping meals. Missing meals can increase gas and watery stools.   Some foods may cause blockages (see Food Selection Guide). Use caution when eating these foods. Eat small amounts and chew your foods well to prevent blockage.  Get enough fluids. Aim for at least 8 to 10 cups of liquid per day. Drink liquids 30 minutes after meals or snacks to avoid flushing foods through your system too quickly.  During times of higher output (1,800 milliliters per day or more) or heavy sweating, you need to drink more fluids. You may need to actually measure how much you are drinking and your output from your ostomy  when it is high:   1 ounce is 30 milliliters o 1 cup is 8 ounces  8 cups is 64 ounces or 2 quarts or 2 liters  Watch for signs and symptoms of fluid-electrolyte imbalance. If symptoms occur, seek treatment right away. Symptoms include:   Dry mouth  Reduced urine output (not as much urine or urinating less often than normal)  Dark-colored urine  Feeling dizzy when you stand up  Noticeable fatigue (feeling extremely tired)  Abdominal cramping  If you have a high output ostomy, you may need to use an oral rehydration solution (ORS) to replace fluid loss. The World Health Organization has a  solution in powder form you can buy (called Oral Rehydration Salts). Some sports drinks can increase stoma output, so pediatric electrolyte solutions, such as Pedialyte, are recommended instead. A less expensive option is to make the oral rehydration solution yourself using one of the following recipes:   2 cups Gatorade + 2 cups water +  teaspoon salt  3 cups water + 1 cup orange juice +  teaspoon salt +  teaspoon baking soda o  cup grape juice or cranberry juice + 3 cups water +  teaspoon salt o 1 cup apple juice + 3 cups water +  teaspoon salt  4 cups (1 liter) water +  teaspoon table salt + 6 level teaspoons sugar (World Health Organization's ORS recipe)  Your registered dietitian nutritionist or doctor may suggest increasing foods that are higher in sodium and potassium. Remember to choose lower fiber foods that are high in potassium. Some examples of high-potassium foods include soy milk, yogurt, cottage cheese, potatoes without skin, liquid supplements (such as Ensure Muscle Health), orange juice or tomato juice (if these do not cause reflux or other symptoms), smooth peanut butter, and baked or broiled salmon or [place]. Salt substitute that contains potassium chloride can also be used, but be careful not to overuse it. Higher-sodium foods added to the diet should also be lower fiber and not fried.   Recommended Foods  If particular foods make you feel unwell, stop eating them and try again in 2 to 3 weeks.  Everyone's tolerance to foods is different. Finding foods that are best for you may require some trial and error.   Dairy Foods  Recommended Foods  Notes   Fat-free (skim) or low-fat (1%) milk*  Soy milk, rice milk, or almond milk  Lactose-free milk  Yogurt*  Powdered milk  Cheese*  Buttermilk  Low-fat ice cream* or sherbet  If you feel unwell after drinking milk or eating dairy foods, try lactose-free products. Foods marked with an asterisk (*) on this list have lactose.   Aged cheeses (such as cheddar and Swiss) are lower in lactose.  Check labels for calcium content of almond milk and rice milk to make sure they have 30% calcium. These beverages are not high in protein, so include other high-protein foods at meals and snacks to support healing.  Soy milk may cause gas and bloating for some people. It is best to avoid soy milk if it causes symptoms.    Protein Foods  Recommended Foods  Notes   Very tender, well-cooked meats and poultry prepared without added fat  Fish  Smooth nut butter (limit to 1 to 2 tablespoons a day)  Eggs (scrambled eggs are easiest to digest)  When trying nuts, fish, and eggs, start with small amounts. These foods may cause odors.  Use a moist heating method for meats and poultry:  Use water or broth to cook the meat or poultry at a lower temperature.  Cover the dish when cooking in the oven, so the food cooks in its own juices.    Marinate meat first with an acidic ingredient, such as vinegar, lemon juice, or wine, and some oil or by using chopped raw pineapple, which has natural enzymes. Pour off the marinade before cooking.     Grains  Recommended Foods  Notes   Grain foods made from white or refined flour, including bread, bagels, rolls; crackers; pasta; and cereal  White rice  Cream of wheat or cream of rice  Refined grits  Cereals made from refined grains, without added fiber, such as rice chex or cornflakes  Choose grain foods with less than 2 grams of fiber per serving. The grams of dietary fiber in 1 serving are listed on the Nutrition Facts label of packaged foods.  Read labels if you have problems with lactose. Any foods containing milk may contain lactose.    Vegetables  Recommended Foods  Notes   Well-cooked vegetables without seeds or skins, such as green beans or carrots  Potatoes without skin  Shredded lettuce on sandwich  Strained vegetable juice  Some vegetables may cause gas, blockages, or odors for some  people. See the Food Selection Guide for foods that may cause symptoms.    Fruits  Recommended Foods  Notes   Pulp-free fruit juices (except prune juice)  Ripe banana  Soft melons (watermelon or honeydew)  Peeled and cooked apple  Canned fruits (except pineapple), but avoid heavy syrup, which can make diarrhea worse  These can be added later with doctor's OK to increase fiber:  Some fruits may cause blockages. See the Food Selection Guide for foods that may cause symptoms.  Look for 100% fruit juices. These may need to be diluted in half or used to make oral rehydration solutions to be tolerated well.   ?  ?  Avocado  Orange or grapefruit without  membrane  Grapefruit and grapefruit juice should not be eaten when taking statin-type medications or budesonide (Entocort).    Fats   Recommended Foods  Notes   Any; olive oil and canola oil are good choices for heart health.  Start with very small amounts. Limit fats and oils to less than 8 teaspoons per day. Fats may cause symptoms or discomfort.  Spreads, butter contain lactose.    Beverages  Recommended Beverages  Notes   Water  Decaffeinated coffee or tea  Noncarbonated beverages  Rehydration beverages  Carbonated beverages may cause gas.     Foods Not Recommended    When you first start eating solid foods, avoid foods that are high in fiber, such as whole grains, dried beans, and most raw vegetables and fruits. (See Foods Recommended list for low-fiber foods. See Food Selection Guide lists for foods that cause symptoms.) ? Avoid acidic, spicy, fried, and greasy foods and foods high in sugar.  Limit food and drinks that contain sugar substitutes. Often diluted sugar-containing drinks do better. Cut fruit juice in half by adding an equal amount of water or more.  While you heal, avoid any foods that cause you to have odor, gas, diarrhea, or obstruction.  You should not have any salad or raw vegetables.  If you have kidney stones, you  should try to avoid foods that are high in oxalate. Your RDN may give you a more detailed list of high-oxalate foods if you are at risk. Some foods  that are high in oxalate include:   Grains: Wheat bran, wheat germ, whole wheat flour   Protein Foods: Beans, tofu, nuts  Vegetables: Beets, dark leafy greens, sweet potato  Beverages: Beer, cocoa, instant tea, instant coffee  Other: Carob, chocolate    Food Selection Guide for Ileostomy Patients   Foods That May Cause Blockage   Apples, unpeeled  Bean sprouts  Cabbage, raw  Casing on sausage  Celery  Chinese vegetables  Coconut  Coleslaw  Corn  Popcorn  Relishes and olives  Salad greens  Seeds and nuts  Spinach Tough, fibrous meats (for example, steak on grill)  Vegetable and fruit skins  Whole grains  Cucumbers  Dried fruit, raisins  Grapes  Green peppers  Mushrooms  Nuts  Peas  Pickles  Pineapple      Foods That May Cause Gas or Odor  Alcohol  Apples  Asparagus  Bananas  Beer  Broccoli  Brussels sprouts  Cabbage  Carbonated beverages  Cauliflower  Cheese, some types  Corn  Cucumber  Dairy products  Dried beans and peas  Eggs  Fatty foods  Fish  Grapes  Green pepper  Melons  Onions  Peanuts  Prunes  Radishes  Turnips     Foods That May Help Relieve Gas and Odor  Buttermilk  Cranberry juice  Parsley  Yogurt with active cultures      Diarrhea   Initially after surgery, the stool will be liquid and watery because of where the ileostomy is located in the intestine. The stool will gradually become thicker over the next few weeks (more like a consistency of pudding or oatmeal). There are some foods that can help make the stool thicker or thinner, but if changes to your diet do not improve stools, then your doctor may be able to recommend some medications to help.   Foods That May Cause Diarrhea (looser or more frequent stool)  Alcohol (including beer)  Apricots (and stone fruits)  Beans, baked or  legumes  Bran  Broccoli  Brussels sprouts  Cabbage  Caffeinated drinks  (especially hot)  Chocolate  Corn  Fried meats, fish poultry  Fruit juice: apple, grape, orange  Fruit: fresh, canned, or dried  Glucose-free foods containing mannitol or  sorbitol  Gum, sugar free   High-fat foods  High-sugar foods  Licorice  Milk and dairy foods  Nuts or seeds  Peaches (stone fruit)  Peas  Plums (stone fruit)  Prune juice or prunes  Soup  Spicy foods  Sugar-free substitutes  Tomatoes  Turnip greens/green leafy vegetables, raw  Wheat/whole grains  Wine    Foods That May Help Thicken Stool  Applesauce   Bananas   Barley (when OK to have fiber)   Cheese   Marshmallows   Oatmeal (when OK to have fiber)  Pasta (sauces may increase symptoms)  Peanut butter, creamy  Potatoes, no skin  Pretzels  Saltines  Tapioca  White bread (not high fiber)  White rice, boiled  Yogurt   Foods That May Discolor Stool   Turn stool red  Darken stool  Beets  Asparagus  Foods with red dye  Broccoli   Spinach      Ileostomy Sample 1-Day Menu  Note: It may help to not have beverages with meals or snacks, waiting 30 minutes before drinking. Fiber is rounded to the nearest 0.5g and listed in parentheses ( ) after foods that contain fiber.   Meal  Menu  Total Fiber   Breakfast  1  scrambled egg plain or with  cup low-fat, low-lactose cheese  English muffin or white toast (1.5 grams fiber) with 1 teaspoon margarine   cup unsweetened applesauce (1.5 grams fiber)  3.0 grams   30 minutes after breakfast  4 ounces cranberry grape juice diluted with 4 ounces water   1 cups (12 ounces) decaf tea     Lunch   2 ounces shaved or very thinly sliced roast beef with juices   cup mashed potatoes (1.5 grams fiber) (made with lactose-free milk or chicken broth and olive oil or very small amount of butter)   cup green beans, well-cooked (2.0 grams fiber)  1 cup canned peaches, extra light syrup (1.0 gram  fiber)  4.5 grams   30 minutes after lunch  1 cup (8 ounces) fat-free, soy, or lactose-free milk     Snack  8 ounces yogurt without fruits or nuts  1 ripe banana (3.0 grams fiber)    3.0 grams   30 min after snack  1 cup (8 ounces) sport drink or Pedialyte or Oral Rehydration Solution (ORS) if needed     Dinner  [place] sandwich: 2 ounces [place], 1 ounce Swiss cheese,  2 slices white bread (1.0 gram fiber)  2 ounces pretzels (1.0 gram fiber)  2.0 grams   30 minutes after dinner  1 cups water or Gatorade/Pedialyte or ORS     Snack   6 saltine crackers (0.5 grams fiber)  1 ounce low-fat cheddar cheese or Swiss  0.5 grams   30 minutes after snack  1 cup (8 ounces) fat-free, soy, or lactose-free milk         13 grams fiber daily      [facility]      [facility], [place] [phone]  OPEN ABDOMINAL SURGERY: POST OP INSTRUCTIONS  Always review your discharge instruction sheet given to you by the facility where your surgery was performed.  IF YOU HAVE DISABILITY OR FAMILY LEAVE FORMS, YOU MUST BRING THEM TO THE OFFICE FOR PROCESSING.  PLEASE DO NOT GIVE THEM TO YOUR DOCTOR.  A prescription for pain medication may be given to you upon discharge.  Take your pain medication as prescribed, if needed.  If narcotic pain medicine is not needed, then you may take acetaminophen (Tylenol) or ibuprofen (Advil) as needed. Take your usually prescribed medications unless otherwise directed. If you need a refill on your pain medication, please contact your pharmacy. They will contact our office to request authorization.  Prescriptions will not be filled after 5pm or on week-ends. You should follow a light diet the first few days after arrival home, such as soup and crackers, pudding, etc.unless your doctor has advised otherwise. A high-fiber, low fat diet can be resumed as tolerated.   Be sure to include lots of fluids daily. Most patients will experience some swelling and bruising on the chest and neck area.   Ice packs will help.  Swelling and bruising can take several days to resolve Most patients will experience some swelling and bruising in the area of the incision. Ice pack will help. Swelling and bruising can take several days to resolve..  It is common to experience some constipation if taking pain medication after surgery.  Increasing fluid intake and taking a stool softener will usually help or prevent this problem from occurring.  A mild laxative (Milk of Magnesia or Miralax) should be taken according to package directions if there are no bowel movements after 48 hours.  You may have steri-strips (  small skin tapes) in place directly over the incision.  These strips should be left on the skin for 7-10 days.  If your surgeon used skin glue on the incision, you may shower in 24 hours.  The glue will flake off over the next 2-3 weeks.  Any sutures or staples will be removed at the office during your follow-up visit. You may find that a light gauze bandage over your incision may keep your staples from being rubbed or pulled. You may shower and replace the bandage daily. ACTIVITIES:  You may resume regular (light) daily activities beginning the next day--such as daily self-care, walking, climbing stairs--gradually increasing activities as tolerated.  You may have sexual intercourse when it is comfortable.  Refrain from any heavy lifting or straining until approved by your doctor. You may drive when you no longer are taking prescription pain medication, you can comfortably wear a seatbelt, and you can safely maneuver your car and apply brakes Return to Work: ___________________________________ Dennis Bast should see your doctor in the office for a follow-up appointment approximately two weeks after your surgery.  Make sure that you call for this appointment within a day or two after you arrive home to insure a convenient appointment time. OTHER INSTRUCTIONS:   _____________________________________________________________ _____________________________________________________________  WHEN TO CALL YOUR DOCTOR: Fever over 101.0 Inability to urinate Nausea and/or vomiting Extreme swelling or bruising Continued bleeding from incision. Increased pain, redness, or drainage from the incision. Difficulty swallowing or breathing Muscle cramping or spasms. Numbness or tingling in hands or feet or around lips.  The clinic staff is available to answer your questions during regular business hours.  Please don't hesitate to call and ask to speak to one of the nurses if you have concerns.  For further questions, please visit [email or web address]

## 2022-03-08 ENCOUNTER — Inpatient Hospital Stay: Payer: Medicare Other | Admitting: Internal Medicine

## 2022-03-08 ENCOUNTER — Inpatient Hospital Stay (HOSPITAL_COMMUNITY): Payer: Medicare Other

## 2022-03-08 DIAGNOSIS — K567 Ileus, unspecified: Secondary | ICD-10-CM | POA: Diagnosis not present

## 2022-03-08 DIAGNOSIS — I480 Paroxysmal atrial fibrillation: Secondary | ICD-10-CM | POA: Diagnosis not present

## 2022-03-08 DIAGNOSIS — K560 Paralytic ileus: Secondary | ICD-10-CM

## 2022-03-08 DIAGNOSIS — A498 Other bacterial infections of unspecified site: Secondary | ICD-10-CM | POA: Diagnosis not present

## 2022-03-08 DIAGNOSIS — A0472 Enterocolitis due to Clostridium difficile, not specified as recurrent: Secondary | ICD-10-CM | POA: Diagnosis not present

## 2022-03-08 DIAGNOSIS — K6389 Other specified diseases of intestine: Secondary | ICD-10-CM | POA: Diagnosis not present

## 2022-03-08 DIAGNOSIS — E860 Dehydration: Secondary | ICD-10-CM | POA: Diagnosis not present

## 2022-03-08 DIAGNOSIS — E440 Moderate protein-calorie malnutrition: Secondary | ICD-10-CM

## 2022-03-08 DIAGNOSIS — R935 Abnormal findings on diagnostic imaging of other abdominal regions, including retroperitoneum: Secondary | ICD-10-CM

## 2022-03-08 DIAGNOSIS — E43 Unspecified severe protein-calorie malnutrition: Secondary | ICD-10-CM | POA: Diagnosis not present

## 2022-03-08 LAB — GASTROINTESTINAL PANEL BY PCR, STOOL (REPLACES STOOL CULTURE)

## 2022-03-08 LAB — BASIC METABOLIC PANEL
Anion gap: 8 (ref 5–15)
BUN: 12 mg/dL (ref 8–23)
CO2: 24 mmol/L (ref 22–32)
Calcium: 8 mg/dL — ABNORMAL LOW (ref 8.9–10.3)
Chloride: 99 mmol/L (ref 98–111)
Creatinine, Ser: 0.44 mg/dL (ref 0.44–1.00)
GFR, Estimated: >60 mL/min (ref >60)
Glucose, Bld: 83 mg/dL (ref 70–99)
Potassium: 3.3 mmol/L — ABNORMAL LOW (ref 3.5–5.1)
Sodium: 131 mmol/L — ABNORMAL LOW (ref 135–145)

## 2022-03-08 LAB — VITAMIN D 25 HYDROXY (VIT D DEFICIENCY, FRACTURES): Vit D, 25-Hydroxy: 29.55 ng/mL — ABNORMAL LOW (ref 30–100)

## 2022-03-08 MED ORDER — VITAMIN D (ERGOCALCIFEROL) 1.25 MG (50000 UNIT) PO CAPS
50000.0000 [IU] | ORAL_CAPSULE | ORAL | Status: DC
  Administered 2022-03-08 – 2022-03-29 (×4): 50000 [IU] via ORAL
  Filled 2022-03-08 (×5): qty 1

## 2022-03-08 MED ORDER — POTASSIUM CHLORIDE 10 MEQ/100ML IV SOLN
10.0000 meq | INTRAVENOUS | Status: DC
  Administered 2022-03-08: 10 meq via INTRAVENOUS
  Filled 2022-03-08: qty 100

## 2022-03-08 MED ORDER — BANATROL TF EN LIQD
60.0000 mL | Freq: Two times a day (BID) | ENTERAL | Status: DC
  Filled 2022-03-08: qty 60

## 2022-03-08 MED ORDER — POTASSIUM CHLORIDE 10 MEQ/100ML IV SOLN: 10.0000 meq | INTRAVENOUS | Status: AC

## 2022-03-08 MED ORDER — PROSOURCE PLUS PO LIQD
30.0000 mL | Freq: Three times a day (TID) | ORAL | Status: DC
  Administered 2022-03-08 – 2022-03-20 (×35): 30 mL via ORAL
  Filled 2022-03-08 (×35): qty 30

## 2022-03-08 MED ORDER — BOOST / RESOURCE BREEZE PO LIQD CUSTOM
1.0000 | Freq: Three times a day (TID) | ORAL | Status: DC
  Administered 2022-03-08 – 2022-03-20 (×30): 1 via ORAL

## 2022-03-08 MED ORDER — KCL IN DEXTROSE-NACL 20-5-0.9 MEQ/L-%-% IV SOLN
INTRAVENOUS | Status: DC
  Filled 2022-03-08 (×4): qty 1000

## 2022-03-08 MED ORDER — BANATROL TF EN LIQD
60.0000 mL | Freq: Two times a day (BID) | ENTERAL | Status: DC
  Administered 2022-03-08 (×2): 60 mL via ORAL
  Filled 2022-03-08 (×3): qty 60

## 2022-03-08 NOTE — Progress Notes (Signed)
Nutrition Brief Note  Surgery signed off as no surgical interventions planned at this time. GI continues to follow. Upgraded diet to clear liquids today. Patient wanting to avoid TPN and Cortrak placement if possible. Will attempt to optimize PO intake with nutrition supplements in addition to clear liquid meals.   Boost Breeze and ProSource Plus ordered TID. Will encourage patient to consume these supplements and can adjust once diet advanced further.   Clayborne Dana, RDN, LDN Clinical Nutrition

## 2022-03-08 NOTE — Progress Notes (Signed)
Subjective: Hungry.  Continues to feel better.  ROS: See above, otherwise other systems negative  Objective: Vital signs in last 24 hours: Temp:  [98.2 F (36.8 C)] 98.2 F (36.8 C) (11/09 0300) Pulse Rate:  [65-70] 70 (11/09 0300) Resp:  [16-18] 16 (11/09 0300) BP: (105-109)/(53-91) 105/91 (11/09 0300) SpO2:  [96 %-98 %] 96 % (11/09 0300) Weight:  [49 kg] 49 kg (11/09 0546) Last BM Date : 03/07/22  Intake/Output from previous day: 11/08 0701 - 11/09 0700 In: -  Out: 600 [Urine:600] Intake/Output this shift: No intake/output data recorded.  PE: Gen: NAD Abd: much softer and less distended.  NT, +BS  Lab Results:  Recent Labs    03/06/22 0533 03/07/22 0303  WBC 11.0* 8.1  HGB 11.3* 10.7*  HCT 33.0* 32.4*  PLT 299 328   BMET Recent Labs    03/07/22 0303 03/08/22 0221  NA 131* 131*  K 4.3 3.3*  CL 98 99  CO2 26 24  GLUCOSE 109* 83  BUN 16 12  CREATININE 0.53 0.44  CALCIUM 8.5* 8.0*   PT/INR No results for input(s): "LABPROT", "INR" in the last 72 hours. CMP     Component Value Date/Time   NA 131 (L) 03/08/2022 0221   K 3.3 (L) 03/08/2022 0221   CL 99 03/08/2022 0221   CO2 24 03/08/2022 0221   GLUCOSE 83 03/08/2022 0221   GLUCOSE 99 05/14/2006 0941   BUN 12 03/08/2022 0221   CREATININE 0.44 03/08/2022 0221   CALCIUM 8.0 (L) 03/08/2022 0221   PROT 4.9 (L) 03/06/2022 0533   ALBUMIN 2.0 (L) 03/06/2022 0533   AST 34 03/06/2022 0533   ALT 26 03/06/2022 0533   ALKPHOS 68 03/06/2022 0533   BILITOT 0.9 03/06/2022 0533   GFRNONAA >60 03/08/2022 0221   GFRAA >60 07/02/2019 1151   Lipase     Component Value Date/Time   LIPASE 45 03/05/2022 1607       Studies/Results: DG Abd Portable 1V  Result Date: 03/07/2022 CLINICAL DATA:  Provided history: Ileus. EXAM: PORTABLE ABDOMEN - 1 VIEW COMPARISON:  CT abdomen/pelvis 03/05/2022. Prior abdominal radiographs 03/05/2022 and earlier. FINDINGS: Persistent prominent air distention of the colon.  Most notably, the transverse colon measures 12.5 cm in diameter, slightly decreased from the abdominal radiographs of 03/05/2022 (measuring 14.7 cm in diameter on this prior exam). No definite dilated small bowel loops are appreciated. Evaluation for intraperitoneal free air is limited on supine radiography. Known hiatal hernia. Lumbar spondylosis. IMPRESSION: Persistent prominent air distention of the colon. Most notably, the transverse colon measures 12.5 cm in diameter (slightly decreased from the prior abdominal radiographs of 03/05/2022). No definite dilated small bowel loops are identified. Known hiatal hernia. Electronically Signed   By: Kellie Simmering D.O.   On: 03/07/2022 11:17    Anti-infectives: Anti-infectives (From admission, onward)    Start     Dose/Rate Route Frequency Ordered Stop   03/16/22 0800  vancomycin (VANCOCIN) capsule 125 mg  Status:  Discontinued       See Hyperspace for full Linked Orders Report.   125 mg Oral Daily with breakfast 03/06/22 0220 03/06/22 1240   03/09/22 0800  vancomycin (VANCOCIN) capsule 125 mg  Status:  Discontinued       See Hyperspace for full Linked Orders Report.   125 mg Oral 2 times daily with meals 03/06/22 0220 03/06/22 1240   03/06/22 1800  vancomycin (VANCOCIN) 500 mg in sodium chloride irrigation 0.9 % 100  mL ENEMA  Status:  Discontinued        500 mg Rectal Every 6 hours 03/06/22 1429 03/06/22 1528   03/06/22 1800  vancomycin (VANCOCIN) capsule 125 mg       Note to Pharmacy: 02/27/22 - 03/08/22: Take 1 capsule four times daily; 11/10- 11/16: take 1 capsule two times daily; 11/17 - 11/23: take 1 capsule once daily; from 11/24 and on, see other prescrption sent to [facility]     125 mg Oral Every 6 hours 03/06/22 1528 03/09/22 1759   03/06/22 1300  metroNIDAZOLE (FLAGYL) IVPB 500 mg        500 mg 100 mL/hr over 60 Minutes Intravenous Every 8 hours 03/06/22 1249     03/06/22 0800  vancomycin (VANCOCIN) capsule 125 mg  Status:  Discontinued       Note  to Pharmacy: 02/27/22 - 03/08/22: Take 1 capsule four times daily; 11/10- 11/16: take 1 capsule two times daily; 11/17 - 11/23: take 1 capsule once daily; from 11/24 and on, see other prescrption sent to [facility]     125 mg Oral 3 times daily before meals & bedtime 03/06/22 0216 03/06/22 1240        Assessment/Plan Recurrent c diff colitis, Ileus secondary to imodium -patient took imodium a couple of days ago with worsening abdominal distention, but seems to be wearing off and having return of bowel function.  Her abdomen is much softer, less distended, and x-ray with significant improvement in colonic size as well. -no plans for surgical intervention. -defer to GI/ID/medicine regarding further plans for c diff treatment. -may have diet from our standpoint -we will sign off at this time   ID - IV flagyl, PO vancomycin VTE - SCDs, can resume eliquis as no plans for surgery FEN - IVF, NPO, diet per other services. Foley - none   Paroxysmal atrial fibrillation on eliquis  Prior TIA Prior history of H. pylori gastritis Protein calorie malnutrition Hyponatremia  I reviewed Consultant GI notes, hospitalist notes, last 24 h vitals and pain scores, last 48 h intake and output, last 24 h labs and trends, and last 24 h imaging results.   LOS: 2 days    Henreitta Cea , [facility] 03/08/2022, 7:54 AM Please see Amion for pager number during day hours 7:00am-4:30pm or 7:00am -11:30am on weekends

## 2022-03-08 NOTE — Progress Notes (Signed)
[facility] PROGRESS NOTE  APRYL BRYMER (DOB: [birth year]) DDU:[id number] PCP: Cassandria Anger, MD  Brief Narrative: Julie Montgomery is an 86 y.o. female with a history of dementia, PAF, recurrent C diff, and chronic hyponatremia who presented 11/6 with decreased oral intake. Recent hospitalization from 10/25 to 02/28/22 for recurrent C diff colitis. She was discharge on oral vancomycin taper. At home her diarrhea was improving, taking imodium, though oral intake was very poor. She appeared dehydrated on exam with abdominal tenderness.    Na 129, K 4,3 CL 94 bicarbonate 23, glucose 104 bun 16 cr 0,49  Wbc 13,5 hgb 11,4 plt 356    Abdominal radiograph with marked dilatation of the ascending colon with interval worsening. Mild dilatation of the small bowel loops. Findings consistent with ileus.    CT abdomen and pelvis with persistent and mildly progressive colonic distention with air fluid levels, decreased caliber distal colon at the level of the mid sigmoid colon and increase caliber proximal small bowel loops with the left hemi abdomen. Fecalization of the prominent small bowel loops.    Chest radiograph with hyperinflation and small left pleural effusion, no infiltrates.   Subjective: Amenable to taking po, but not terribly hungry. She's taking fluids without issue. Abdominal pain is less, no vomiting. Unaware of fecal output. No bleeding. Husband at bedside reports LE edema is actually much better than recent baseline.   Objective: BP (!) 101/47 (BP Location: Left Arm)   Pulse 82   Temp (!) 97.5 F (36.4 C) (Oral)   Resp (!) 23   Ht '5\' 2"'$  (1.575 m)   Wt 49 kg   SpO2 96%   BMI 19.75 kg/m   Gen: Frail elderly female in no distress Pulm: Nonlabored and clear CV: RRR, NSR on monitor. No JVD. 1-2+ pitting LE edema, though skin is showing signs of contraction. GI: Soft, diffusely tender with decreased distention, hyperactive bowel sounds.    Assessment & Plan: Ileus:  Favored Dx in setting of imodium over toxic megacolon at this time with relatively modest leukocytosis (normalized as of 11/8), nontoxic appearance and exam.  - Symptoms improving (having BMs) and abdominal exam improved while holding imodium. XR personally reviewed showing diminution in viscus size compared to prior.  - Start CLD and advance as tolerated.  - Surgery signed off.    ETEC:  - Supportive care for this for now. Note WBC normal, no hematochezia.   Recurrent Clostridioides difficile infection:  - Continue PO vancomycin QID, DC flagyl per ID. considering Vowst. Appreciate their recommendations.  - Work up pending. - Patient will benefit from outpatient FMT at tertiary care center after discharge. - Contact isolation  Acute urinary retention, pyuria:  - Inserted foley, consider TOV once more clinically stable. Possibly 11/10-11/11.  - Urine culture no growth   Dehydration, hypovolemic hyponatremia:  - Continue isotonic saline. If tolerating adequate po to replace GI losses (rectal pouch in place), can DC IVF.   Hypokalemia:  - Supplement in IVF.   Hypomagnesemia: Resolved - Monitor  PAF: Patient not able to take PO meds due to ileus. Currently in NSR with soft BP. - Continue cardiac monitoring, IV metoprolol prn. - Given her risk factors and hx TIA, restarted anticoagulation. If tolerating po, will transition back to po.  - Once taking po reliably, restart home diltiazem, eliquis  Dementia:  - Continue namenda.  - Husband at bedside   Anemia of chronic disease: - No evidence of acute bleeding, monitor intermittently.  Severe protein calorie malnutrition:  - RD consulted, supplement protein as much as possible.   Vitamin D deficiency:  - Supplement 50k units weekly x8 weeks and recheck  Julie Pour, MD [facility] [email or web address] 03/08/2022, 5:13 PM

## 2022-03-08 NOTE — Progress Notes (Signed)
Daily Progress Note   Patient Name: Julie Montgomery       Date: 03/08/2022 DOB: October 16, 1935  Age: 86 y.o. MRN#: [id number] Attending Physician: Julie Pour, MD Primary Care Physician: Julie Anger, MD Admit Date: 03/05/2022  Reason for Consultation/Follow-up: Establishing goals of care  Subjective: Medical records reviewed including progress notes, labs, imaging. Patient assessed at the bedside.  She is in good spirits today, very appreciative of the great care she's received from all staff and excited for her diet to be progressed. We are then joined by her husband at the bedside.  Created space and opportunity for patient and family's thoughts and feelings on her current illness. Julie Montgomery shares he checked the imodium pills and determined that patient had received this twice daily, for 8 total doses. Emotional support and reassurance was provided. He is happy about the improvement of her bilateral leg swelling. They are looking forward to patient's return home, but also note they do not want to rush things and end up readmitted. We discussed the option of additional support in the home, as she states Julie Montgomery has hard a hard time caring for her. Counseled on the importance of anticipatory care planning. Outpatient palliative care was explained and offered and patient/family are agreeable.   Questions and concerns addressed. PMT will continue to support holistically.   Length of Stay: 2  Physical Exam Vitals and nursing note reviewed.  Constitutional:      General: She is not in acute distress.    Appearance: She is cachectic. She is ill-appearing.  Cardiovascular:     Rate and Rhythm: Normal rate.  Pulmonary:     Effort: Pulmonary effort is normal.  Abdominal:     General: There is  distension.  Skin:    General: Skin is warm and dry.  Neurological:     Mental Status: She is alert.  Psychiatric:        Behavior: Behavior is cooperative.            Vital Signs: BP (!) 105/91 (BP Location: Left Arm)   Pulse 70   Temp 98.2 F (36.8 C) (Oral)   Resp 16   Ht '5\' 2"'$  (1.575 m)   Wt 49 kg   SpO2 96%   BMI 19.75 kg/m  SpO2: SpO2: 96 % O2 Device:  O2 Device: Room Air O2 Flow Rate:        Palliative Assessment/Data: 40% at best   Palliative Care Assessment & Plan   Patient Profile: 86 y.o. female  with past medical history of  dementia, recurrent C. difficile, paroxysmal atrial fibrillation chronically anticoagulated on Eliquis, chronic hyponatremia, anemia of chronic disease associated baseline hemoglobin range 11-13, admitted on 03/05/2022 with poor oral intake and dehydration, diarrhea.    Patient has been hospitalized several times for recurrent C. difficile infections (9/14-9/16, 10/10-10/16, 10/26-11/1).  She is currently admitted again for AKI, hypercalcemia, ongoing management of C. difficile infection.  In the ED, CT of the abdomen and pelvis showed mildly progressive colonic distention with air-fluid levels and diminished bowel transit concerning for toxic megacolon, no signs of pneumatosis or bowel perforation.  Patient is a poor candidate for endoscopic or surgical intervention.   PMT has been consulted to assist with goals of care conversation.  Assessment: Goals of care conversation Recurrent C. difficile Colonic ileus  Recommendations/Plan: Continue full code/full scope treatment Goals are clear for aggressive life-prolonging interventions Patient is agreeable to outpatient palliative care referral, TOC assistance is appreciated  Psychosocial emotional support provided PMT will continue to follow and support peripherally. Please secure chat or call team line with urgent needs   Prognosis:  Unable to determine  Discharge Planning: To Be  Determined  Care plan was discussed with patient, patient's husband   MDM high         Julie Montgomery Johnnette Litter, PA-C  Palliative Medicine Team Team phone # [phone]  Thank you for allowing the Palliative Medicine Team to assist in the care of this patient. Please utilize secure chat with additional questions, if there is no response within 30 minutes please call the above phone number.  Palliative Medicine Team providers are available by phone from 7am to 7pm daily and can be reached through the team cell phone.  Should this patient require assistance outside of these hours, please call the patient's attending physician.

## 2022-03-08 NOTE — Progress Notes (Signed)
Brief ID Update:   Chart reviewed today. She seems to be improving with increased appetite and softer abdomen. WBC resolved to normal. Creatinine / BUN normal.   Noted positive ETEC on GI panel - usually self limiting and would be inclined to not treat and watch. Could do azithromycin dose x 1 as imodium effects withdraw and ileus resolves; relatively low risk for augmenting cdiff, but would prefer to continue efforts for treatment targeted to CDIff infection.   We are still pending a decision for Vowst approval. Continue QID vancomycin for now stop IV flagyl.   Will see her again tomorrow and hopefully provide an update on this.    Janene Madeira, MSN, NP-C [facility] [email or web address] Pager: [phone] Office: [phone] [facility] Main Line: [place] Communication Welcome

## 2022-03-08 NOTE — Progress Notes (Addendum)
Progress Note  Primary GI: Dr. Fuller Plan   Subjective  Chief Complaint: Possible ileus versus toxic megacolon and setting of recurrent C. difficile  Husband in the room with patient at this time which is helpful as patient is poor historian and poor memory. Continues to have bilateral leg swelling with weeping, blisters which are covered. Patient denies nausea, vomiting, fever or chills.  She is slightly hungry. [name] been having brown liquid stools throughout the night, has fecal incontinence with this and is unable to know when she is having a bowel movement. No hematochezia no melena. Abdomen is much softer, still some discomfort.    Objective   Vital signs in last 24 hours: Temp:  [98.2 F (36.8 C)] 98.2 F (36.8 C) (11/09 0300) Pulse Rate:  [65-70] 70 (11/09 0300) Resp:  [16-18] 16 (11/09 0300) BP: (105-109)/(53-91) 105/91 (11/09 0300) SpO2:  [96 %-98 %] 96 % (11/09 0300) Weight:  [49 kg] 49 kg (11/09 0546) Last BM Date : 03/07/22 Last BM recorded by nurses in past 5 days Stool Type: Type 7 (Liquid consistency with no solid pieces) (03/07/2022  8:00 PM)  General: Cachectic female in no acute distress  Heart:  Regular rate and rhythm; no murmurs Pulm: Clear anteriorly; no wheezing Abdomen: Distended but soft,  improved bowel sounds still decreased. mild tenderness in the entire abdomen. Without guarding and Without rebound, No organomegaly appreciated. Extremities:  with 2-3+ edema up to mid thigh, some blisters/weeping lower bilateral feet, left greater than right,no warmth, erythema.  Neurologic:  Alert and  oriented x4;  No focal deficits.  Psych:  Cooperative. Normal mood and affect.  Intake/Output from previous day: 11/08 0701 - 11/09 0700 In: -  Out: 600 [Urine:600] Intake/Output this shift: No intake/output data recorded.  Studies/Results: DG Abd Portable 1V  Result Date: 03/08/2022 CLINICAL DATA:  Ileus EXAM: PORTABLE ABDOMEN - 1 VIEW COMPARISON:   03/07/2022 FINDINGS: Interval decrease in gas distention of the stomach, small bowel, and colon, with gas present to the rectum. No substantial burden of stool. No free air in the abdomen on supine radiographs. IMPRESSION: Interval decrease in gas distention of the stomach, small bowel, and colon, with gas present to the rectum. Findings consistent with improved ileus. Electronically Signed   By: Delanna Ahmadi M.D.   On: 03/08/2022 08:30   DG Abd Portable 1V  Result Date: 03/07/2022 CLINICAL DATA:  Provided history: Ileus. EXAM: PORTABLE ABDOMEN - 1 VIEW COMPARISON:  CT abdomen/pelvis 03/05/2022. Prior abdominal radiographs 03/05/2022 and earlier. FINDINGS: Persistent prominent air distention of the colon. Most notably, the transverse colon measures 12.5 cm in diameter, slightly decreased from the abdominal radiographs of 03/05/2022 (measuring 14.7 cm in diameter on this prior exam). No definite dilated small bowel loops are appreciated. Evaluation for intraperitoneal free air is limited on supine radiography. Known hiatal hernia. Lumbar spondylosis. IMPRESSION: Persistent prominent air distention of the colon. Most notably, the transverse colon measures 12.5 cm in diameter (slightly decreased from the prior abdominal radiographs of 03/05/2022). No definite dilated small bowel loops are identified. Known hiatal hernia. Electronically Signed   By: Kellie Simmering D.O.   On: 03/07/2022 11:17    Lab Results: Recent Labs    03/05/22 1607 03/06/22 0533 03/07/22 0303  WBC 13.5* 11.0* 8.1  HGB 11.4* 11.3* 10.7*  HCT 34.0* 33.0* 32.4*  PLT 356 299 328   BMET Recent Labs    03/06/22 0533 03/07/22 0303 03/08/22 0221  NA 128* 131* 131*  K 4.2  4.3 3.3*  CL 96* 98 99  CO2 '24 26 24  '$ GLUCOSE 101* 109* 83  BUN '16 16 12  '$ CREATININE 0.49 0.53 0.44  CALCIUM 8.5* 8.5* 8.0*   LFT Recent Labs    03/06/22 0533  PROT 4.9*  ALBUMIN 2.0*  AST 34  ALT 26  ALKPHOS 68  BILITOT 0.9   PT/INR No results for  input(s): "LABPROT", "INR" in the last 72 hours.   Scheduled Meds:  enoxaparin (LOVENOX) injection  40 mg Subcutaneous Q12H   vancomycin  125 mg Oral Q6H   Continuous Infusions:  dextrose 5 % and 0.9 % NaCl with KCl 20 mEq/L     famotidine (PEPCID) IV 20 mg (03/07/22 1030)   potassium chloride        Patient profile:   86 y.o. female with past medical history significant for prior TIA, osteopenia, prior history of H. pylori gastritis, adenomatous colon polyps, bronchiectasis, and A-fib for which she is on Eliquis and history of multiple episodes of recurrent C. difficile colitis   Impression/Plan:   Recurrent C. Difficile On vancomycin p.o. and Flagyl IV ID consult appreciated, last testing 10/26 PCR negative, toxin positive GI pathogen panel- ETEC detected, with recurrent cdiff, I'm not inclined to treat, will defer to ID We will start patient on Banatrol twice daily for C. difficile/diarrhea Would potentially benefit from [place] outpatient Last Colon 2020, consider repeat colonoscopy/flex sig in the future if Cdiff negative with diarrhea to evaluate for microscopic colitis   Likely colonic ileus in setting of recurrent Cdiff X-ray today shows interval decrease in gas distention of stomach small bowel and colon with gas present in the rectum, consistent with improved ileus Spoke with the [name] Son, [name], he does not believe patient has been getting imodium outpatient, last one was 2 weeks ago.  With progression of diarrhea and abdomen softer today Patient is hungry at this time, will start with liquid diet, advance diet as tolerated.  -Continue to maintain magnesium above 2 and potassium at 4-4.5.  -Encourage early ambulation, suggest PT -Minimize narcotics and anticholinergic medications Repeat KUB/abdominal portable tomorrow   Protein calorie malnutrition  With anasarca with anasarca and lower leg edema with weeping Albumin 03/06/2022  2.0  BMI body mass index is 16.64  kg/m.  Secondary to poor PO intake/diarrhea Patient would like to try to avoid TPN and core track if possible, reasonable to restart oral diet and see how patient progresses, if any worsening symptoms go back to NPO and consider TPN Dietitian consulted  Anasarca/lower leg edema with weeping Likely secondary to protein calorie malnutrition Consider wound consult for leg ulcers/weeping [name] had prolonged hospital stays, has had significant weight loss,   Anemia of  chronic disease Without any GI bleeding   Paroxysmal atrial fibrillation Eliquis on hold,? Able to restart    Principal Problem:   Ileus ([place]) Active Problems:   Goals of care, counseling/discussion   Dehydration   Protein calorie malnutrition (HCC)   Paroxysmal atrial fibrillation (HCC)   Recurrent Clostridioides difficile infection   Anemia of chronic disease   Protein-calorie malnutrition, severe    LOS: 2 days   Vladimir Crofts  03/08/2022, 8:40 AM  GI ATTENDING  Interval history data reviewed.  Agree with outlined above.  Appreciate ID input regarding treatment of recurrent C. difficile.  Abdominal exam and abdominal x-rays improved.  Agree with above recommendations regarding nutrition, physical therapy, and wound consult.  Docia Chuck. Geri [place]., M.D. [facility] Division of Gastroenterology

## 2022-03-08 NOTE — TOC Initial Note (Signed)
Transition of Care [facility]) - Initial/Assessment Note    Patient Details  Name: Julie Montgomery MRN: [id number] Date of Birth: March 25, 1936  Transition of Care [facility]) CM/SW Contact:    Bethena Roys, RN Phone Number: 03/08/2022, 3:28 PM  Clinical Narrative: Risk for readmission assessment completed. PTA patient was from home with spouse. Case Manager spoke with patient and she is currently active with [facility] for PT/OT-will need resumption orders and F2F once stable. Case Manager discussed outpatient palliative services and the patient did not feel like making a decision today on an agency; she asked if we could discuss another day. Case Manager will continue to follow for additional transition of are needs as she progresses.                 Expected Discharge Plan: [place] Barriers to Discharge: Continued Medical Work up   Patient Goals and CMS Choice Patient states their goals for this hospitalization and ongoing recovery are:: to return home   Choice offered to / list presented to : NA  Expected Discharge Plan and Services Expected Discharge Plan: [place] In-house Referral: NA Discharge Planning Services: CM Consult Post Acute Care Choice: Home Health, Resumption of Svcs/PTA Provider Living arrangements for the past 2 months: Single Family Home                   DME Agency: NA       HH Arranged: PT, OT HH Agency: [place] Date [facility] Agency Contacted: 03/08/22 Time HH Agency Contacted: 1526 Representative spoke with at [place]: Tommi Rumps  Prior Living Arrangements/Services Living arrangements for the past 2 months: [place] Lives with:: Spouse Patient language and need for interpreter reviewed:: Yes Do you feel safe going back to the place where you live?: Yes      Need for Family Participation in Patient Care: Yes (Comment) Care giver support system in place?: Yes (comment)   Criminal Activity/Legal  Involvement Pertinent to Current Situation/Hospitalization: No - Comment as needed   Permission Sought/Granted Permission sought to share information with : Family Supports, Customer service manager, Case Optician, dispensing granted to share information with : Yes, Verbal Permission Granted     Permission granted to share info w AGENCY: [facility]        Emotional Assessment Appearance:: Appears stated age Attitude/Demeanor/Rapport: Engaged Affect (typically observed): Appropriate Orientation: : Oriented to Self, Oriented to Place, Oriented to  Time, Oriented to Situation Alcohol / Substance Use: Not Applicable Psych Involvement: No (comment)  Admission diagnosis:  Ileus ([place]) [K56.7] Weakness [R53.1] Generalized abdominal pain [R10.84] Abdominal pain [R10.9] Patient Active Problem List   Diagnosis Date Noted   Abnormal x-ray of abdomen 03/08/2022   Adynamic ileus ([place]) 03/08/2022   Protein-calorie malnutrition, severe 03/07/2022   Hypercalcemia 03/06/2022   Anemia of chronic disease 03/06/2022   Ileus ([place]) 03/06/2022   Abdominal pain 03/05/2022   Atrial fibrillation with RVR ([place]) 02/28/2022   Long term (current) use of anticoagulants 02/28/2022   History of TIA (transient ischemic attack) 02/28/2022   Aortic atherosclerosis ([place]) 02/28/2022   Recurrent colitis due to Clostridioides difficile 02/22/2022   Infectious colitis    C. difficile diarrhea 02/07/2022   Dyspnea 01/26/2022   Hyponatremia 84/53/6468   Metabolic acidosis 07/19/2246   C. difficile colitis 01/11/2022   Paroxysmal atrial fibrillation ([place]) 12/19/2021   Recurrent Clostridioides difficile infection 12/19/2021   Paroxysmal atrial fibrillation with RVR ([place]) 12/14/2021  Protein calorie malnutrition ([place]) 12/13/2021   Dehydration with hyponatremia 12/12/2021   Wound infection following procedure 11/29/2021   Breast lump 08/15/2021   History of hormone replacement therapy 08/15/2021    Hyperlipidemia 08/15/2021   Osteopenia 08/15/2021   Sensorineural hearing loss (SNHL) of both ears 08/15/2021   Underweight 03/31/2021   Statin myopathy 03/09/2021   Achilles tendon disorder, right 02/14/2021   Memory problem 01/19/2020   Ganglion cyst of left foot 01/11/2020   Mass of foot 12/14/2019   Dehydration 10/29/2019   Gastroenteritis 03/23/2019   IBS (irritable bowel syndrome) 01/19/2019   Osteoporosis 01/13/2019   Coronary artery calcification seen on computed tomography 06/11/2018   Sternoclavicular joint pain, right 06/12/2017   Left ulnar fracture 09/06/2016   Post-nasal drip 05/02/2016   Carotid bruit 02/17/2016   Stress at home 10/18/2015   Swelling of foot joint 10/14/2014   Clostridium difficile colitis 10/04/2014   Diarrhea 10/04/2014   Left hand weakness 08/10/2014   Numbness of left hand 04/19/2014   Lipoma of arm 01/23/2012   Goals of care, counseling/discussion 04/03/2011   Edema 02/13/2011   Shoulder pain, right 01/30/2011   Bronchiectasis ([place]) 01/23/2011   Vertigo 09/13/2010   Allergic rhinitis 09/13/2010   CONSTIPATION 03/22/2010   LUMBAGO 03/22/2010   URINARY FREQUENCY, CHRONIC 03/22/2010   Situational mixed anxiety and depressive disorder 02/25/2008   Cough 02/17/2008   Anxiety disorder 02/07/2007   INSOMNIA-SLEEP DISORDER-UNSPEC 02/07/2007   Weight loss 02/07/2007   Dyslipidemia 11/21/2006   OSTEOPENIA 11/21/2006   ALLERGY 11/21/2006   PCP:  Plotnikov, Evie Lacks, MD Pharmacy:   [id number] [place] - [id number]: [phone]: [phone] Readmission Risk Interventions    03/08/2022    3:28 PM 02/27/2022   12:23 PM 02/12/2022    9:29 AM  Readmission Risk Prevention Plan  Transportation Screening Complete Complete Complete  PCP or Specialist Appt within 3-5 Days  Complete Complete  [place]RI or [place] Complete  Complete  Social Work Consult for [place]anning/Counseling  Complete Complete  Palliative Care Screening  Not Applicable Not Applicable  Medication Review (Press photographer Complete Complete Complete  HRI or Home Care Consult Complete    SW Recovery Care/Counseling Consult Complete    Palliative Care Screening Complete    SGrossNot Applicable

## 2022-03-08 NOTE — Progress Notes (Addendum)
Date and time results received: 03/08/22 1240 (use smartphrase ".now" to insert current time)  Test:  Gastrointestinal panel pcr Critical Value: enterotoxigenic e coli: detected  Name of Provider Notified: Dr Bonner Puna, spoke to on the phone  Orders Received? Or Actions Taken?: NO NEW ORDERS RECEIVED

## 2022-03-09 ENCOUNTER — Inpatient Hospital Stay: Payer: Self-pay

## 2022-03-09 ENCOUNTER — Inpatient Hospital Stay (HOSPITAL_COMMUNITY): Payer: Medicare Other

## 2022-03-09 DIAGNOSIS — E860 Dehydration: Secondary | ICD-10-CM | POA: Diagnosis not present

## 2022-03-09 DIAGNOSIS — I480 Paroxysmal atrial fibrillation: Secondary | ICD-10-CM | POA: Diagnosis not present

## 2022-03-09 DIAGNOSIS — A0472 Enterocolitis due to Clostridium difficile, not specified as recurrent: Secondary | ICD-10-CM | POA: Diagnosis not present

## 2022-03-09 DIAGNOSIS — K567 Ileus, unspecified: Secondary | ICD-10-CM | POA: Diagnosis not present

## 2022-03-09 DIAGNOSIS — E876 Hypokalemia: Secondary | ICD-10-CM | POA: Diagnosis not present

## 2022-03-09 DIAGNOSIS — A498 Other bacterial infections of unspecified site: Secondary | ICD-10-CM | POA: Diagnosis not present

## 2022-03-09 DIAGNOSIS — R9389 Abnormal findings on diagnostic imaging of other specified body structures: Secondary | ICD-10-CM | POA: Diagnosis not present

## 2022-03-09 LAB — CBC WITH DIFFERENTIAL/PLATELET
Abs Immature Granulocytes: 0.05 10*3/uL (ref 0.00–0.07)
Basophils Absolute: 0 10*3/uL (ref 0.0–0.1)
Basophils Relative: 0 %
Eosinophils Absolute: 0.1 10*3/uL (ref 0.0–0.5)
Eosinophils Relative: 1 %
HCT: 29.5 % — ABNORMAL LOW (ref 36.0–46.0)
Hemoglobin: 10 g/dL — ABNORMAL LOW (ref 12.0–15.0)
Immature Granulocytes: 1 %
Lymphocytes Relative: 9 %
Lymphs Abs: 1 10*3/uL (ref 0.7–4.0)
MCH: 30.3 pg (ref 26.0–34.0)
MCHC: 33.9 g/dL (ref 30.0–36.0)
MCV: 89.4 fL (ref 80.0–100.0)
Monocytes Absolute: 1.2 10*3/uL — ABNORMAL HIGH (ref 0.1–1.0)
Monocytes Relative: 12 %
Neutro Abs: 8.3 10*3/uL — ABNORMAL HIGH (ref 1.7–7.7)
Neutrophils Relative %: 77 %
Platelets: 311 10*3/uL (ref 150–400)
RBC: 3.3 MIL/uL — ABNORMAL LOW (ref 3.87–5.11)
RDW: 15.8 % — ABNORMAL HIGH (ref 11.5–15.5)
WBC: 10.6 10*3/uL — ABNORMAL HIGH (ref 4.0–10.5)
nRBC: 0 % (ref 0.0–0.2)

## 2022-03-09 LAB — COMPREHENSIVE METABOLIC PANEL
ALT: 17 U/L (ref 0–44)
AST: 13 U/L — ABNORMAL LOW (ref 15–41)
Albumin: 1.5 g/dL — ABNORMAL LOW (ref 3.5–5.0)
Alkaline Phosphatase: 59 U/L (ref 38–126)
Anion gap: 6 (ref 5–15)
BUN: 13 mg/dL (ref 8–23)
CO2: 24 mmol/L (ref 22–32)
Calcium: 7.8 mg/dL — ABNORMAL LOW (ref 8.9–10.3)
Chloride: 100 mmol/L (ref 98–111)
Creatinine, Ser: 0.38 mg/dL — ABNORMAL LOW (ref 0.44–1.00)
GFR, Estimated: >60 mL/min (ref >60)
Glucose, Bld: 116 mg/dL — ABNORMAL HIGH (ref 70–99)
Potassium: 2.8 mmol/L — ABNORMAL LOW (ref 3.5–5.1)
Sodium: 130 mmol/L — ABNORMAL LOW (ref 135–145)
Total Bilirubin: 0.3 mg/dL (ref 0.3–1.2)
Total Protein: 3.8 g/dL — ABNORMAL LOW (ref 6.5–8.1)

## 2022-03-09 LAB — C DIFFICILE (CDIFF) QUICK SCRN (NO PCR REFLEX)
C Diff antigen: NEGATIVE
C Diff interpretation: NOT DETECTED
C Diff toxin: NEGATIVE

## 2022-03-09 LAB — PHOSPHORUS: Phosphorus: 1.6 mg/dL — ABNORMAL LOW (ref 2.5–4.6)

## 2022-03-09 MED ORDER — VANCOMYCIN HCL 125 MG PO CAPS
125.0000 mg | ORAL_CAPSULE | Freq: Two times a day (BID) | ORAL | Status: AC
  Administered 2022-03-09 – 2022-03-14 (×11): 125 mg via ORAL
  Filled 2022-03-09 (×11): qty 1

## 2022-03-09 MED ORDER — DEXTROSE 5 % IV SOLN
1000.0000 mg | INTRAVENOUS | Status: AC
  Administered 2022-03-09: 1000 mg via INTRAVENOUS
  Filled 2022-03-09: qty 10

## 2022-03-09 MED ORDER — POTASSIUM CHLORIDE 20 MEQ PO PACK
40.0000 meq | PACK | Freq: Two times a day (BID) | ORAL | Status: AC
  Administered 2022-03-09 (×2): 40 meq via ORAL
  Filled 2022-03-09 (×2): qty 2

## 2022-03-09 MED ORDER — ENOXAPARIN SODIUM 60 MG/0.6ML IJ SOSY
50.0000 mg | PREFILLED_SYRINGE | Freq: Two times a day (BID) | INTRAMUSCULAR | Status: DC
  Administered 2022-03-09 – 2022-03-18 (×18): 50 mg via SUBCUTANEOUS
  Filled 2022-03-09 (×19): qty 0.6

## 2022-03-09 MED ORDER — DILTIAZEM HCL ER COATED BEADS 180 MG PO CP24
180.0000 mg | ORAL_CAPSULE | Freq: Every day | ORAL | Status: DC
  Administered 2022-03-09 – 2022-03-17 (×9): 180 mg via ORAL
  Filled 2022-03-09 (×9): qty 1

## 2022-03-09 MED ORDER — MEMANTINE HCL 5 MG PO TABS
5.0000 mg | ORAL_TABLET | Freq: Two times a day (BID) | ORAL | Status: DC
  Administered 2022-03-09 – 2022-03-20 (×23): 5 mg via ORAL
  Filled 2022-03-09 (×24): qty 1

## 2022-03-09 MED ORDER — CHLORHEXIDINE GLUCONATE CLOTH 2 % EX PADS
6.0000 | MEDICATED_PAD | Freq: Every day | CUTANEOUS | Status: DC
  Administered 2022-03-09 – 2022-03-30 (×23): 6 via TOPICAL

## 2022-03-09 NOTE — Progress Notes (Signed)
Called due to patient with SOB. RN had placed patient on 2 Lpm [place] on my arrival. Patient with some occasional purse lip breathing and deep breathing but otherwise seemed comfortable. Patient with diminished, clear breath sounds. RN to give patient 30 minutes to see if the oxygen helps with the SOB, if not, then RN will contact MD.

## 2022-03-09 NOTE — Evaluation (Signed)
Physical Therapy Evaluation Patient Details Name: Julie Montgomery MRN: [id number] DOB: [birth year] Today's Date: 03/09/2022  History of Present Illness  Pt is an 86 year old woman who came to the ED on11/06 with distended abdomen, LE swelling, poor PO intake with dehydration. She was discharged 4 days prior with c-diff. Likely recurrent c-diff  and colonic ileus. PMH: CHF, dementia, PAF, chronic hyponatremia, anemia, TIA, osteopenia.  Clinical Impression  Pt has had numerous hospitalizations this year, and is poor historian. Per prior notes lives with husband in single story house. Pt discharged from prior hospitalization utilizing RW for mobilization. Pt can not recall how she was getting around her home. Pt is limited in safe mobility today by increased pain and fatigue, in presence of diminished strength and endurance. Pt requires total A for bed mobility, modAx2 for transfers and min Ax2 for stepping towards HoB. PT recommending resumption of HHPT and if available HHAide to assist with self care. PT will continue to follow acutely.      Recommendations for follow up therapy are one component of a multi-disciplinary discharge planning process, led by the attending physician.  Recommendations may be updated based on patient status, additional functional criteria and insurance authorization.  Follow Up Recommendations Home health PT ([place])      Assistance Recommended at Discharge Frequent or constant Supervision/Assistance (pt with multiple hospitalizations and may need more than spousal support)  Patient can return home with the following  A little help with walking and/or transfers;A little help with bathing/dressing/bathroom;Assistance with cooking/housework;Direct supervision/assist for medications management;Direct supervision/assist for financial management;Assist for transportation;Help with stairs or ramp for entrance;Assistance with feeding    Equipment Recommendations  None recommended by PT  Recommendations for Other Services       Functional Status Assessment Patient has had a recent decline in their functional status and demonstrates the ability to make significant improvements in function in a reasonable and predictable amount of time.     Precautions / Restrictions Precautions Precautions: Fall Precaution Comments: rectal pouch, catheter Restrictions Weight Bearing Restrictions: No      Mobility  Bed Mobility Overal bed mobility: Needs Assistance Bed Mobility: Supine to Sit, Sit to Supine, Rolling Rolling: Max assist   Supine to sit: Total assist Sit to supine: Mod assist, +2 for physical assistance   General bed mobility comments: assist for all aspects supine>sit, guided trunk and assist for LEs back into bed, max assist to roll for pericare and to change bed pad, +2 total assist to pull up in bed    Transfers Overall transfer level: Needs assistance Equipment used: Rolling walker (2 wheels) Transfers: Sit to/from Stand, Bed to chair/wheelchair/BSC Sit to Stand: +2 physical assistance, Mod assist          Lateral/Scoot Transfers: Min guard General transfer comment: cues and assist for hips to EOB prior to standing, assist to rise and steady with increased time, reported dizziness upon standing, but resolved, pt able to march in place and side step with +2 min assist       Balance Overall balance assessment: Needs assistance   Sitting balance-Leahy Scale: Fair     Standing balance support: Bilateral upper extremity supported, No upper extremity supported, During functional activity Standing balance-Leahy Scale: Poor Standing balance comment: B UE support of RW and min assist                             Pertinent Vitals/Pain  Pain Assessment Pain Assessment: Faces Faces Pain Scale: Hurts even more Pain Location: abdomen Pain Descriptors / Indicators: Discomfort, Grimacing, Tightness, Sore Pain  Intervention(s): Limited activity within patient's tolerance, Monitored during session, Repositioned    Home Living Family/patient expects to be discharged to:: Private residence Living Arrangements: Spouse/significant other Available Help at Discharge: Family Type of Home: House Home Access: Stairs to enter   [employer] of Steps: 2 at garage without rail, 5-6 in the back with a rail   Home Layout: One level Home Equipment: Conservation officer, nature (2 wheels) Additional Comments: per chart review, pt unable to offer PLOF    Prior Function Prior Level of Function : Needs assist             Mobility Comments: walking with RW when released from hospital, typically walks without a device ADLs Comments: typically independent in self care, likely assisted for IADLs     Hand Dominance   Dominant Hand: Right    Extremity/Trunk Assessment   Upper Extremity Assessment Upper Extremity Assessment: Defer to OT evaluation    Lower Extremity Assessment Lower Extremity Assessment: Generalized weakness    Cervical / Trunk Assessment Cervical / Trunk Assessment: Normal  Communication   Communication: HOH  Cognition Arousal/Alertness: Awake/alert Behavior During Therapy: Flat affect Overall Cognitive Status: No family/caregiver present to determine baseline cognitive functioning Area of Impairment: Memory, Problem solving, Awareness, Following commands, Safety/judgement                     Memory: Decreased short-term memory Following Commands: Follows one step commands with increased time     Problem Solving: Slow processing, Difficulty sequencing, Requires verbal cues          General Comments General comments (skin integrity, edema, etc.): pt with complaints of tightness in abdomen, legs and arms, reports SoB despite 96%O2 on RA        Assessment/Plan    PT Assessment Patient needs continued PT services  PT Problem List Decreased strength;Decreased  activity tolerance;Decreased balance;Decreased range of motion;Decreased mobility;Decreased coordination;Decreased cognition;Decreased knowledge of use of DME;Pain       PT Treatment Interventions DME instruction;Gait training;Stair training;Functional mobility training;Therapeutic activities;Therapeutic exercise;Balance training;Neuromuscular re-education;Patient/family education    PT Goals (Current goals can be found in the Care Plan section)  Acute Rehab PT Goals Patient Stated Goal: to go home PT Goal Formulation: With patient Time For Goal Achievement: 03/23/22 Potential to Achieve Goals: Fair    Frequency Min 3X/week     Co-evaluation PT/OT/SLP Co-Evaluation/Treatment: Yes Reason for Co-Treatment: For patient/therapist safety PT goals addressed during session: Mobility/safety with mobility OT goals addressed during session: ADL's and self-care       AM-PAC PT "6 Clicks" Mobility  Outcome Measure Help needed turning from your back to your side while in a flat bed without using bedrails?: A Lot Help needed moving from lying on your back to sitting on the side of a flat bed without using bedrails?: Total Help needed moving to and from a bed to a chair (including a wheelchair)?: Total Help needed standing up from a chair using your arms (e.g., wheelchair or bedside chair)?: Total Help needed to walk in hospital room?: Total Help needed climbing 3-5 steps with a railing? : Total 6 Click Score: 7    End of Session   Activity Tolerance: Patient limited by pain;Patient limited by fatigue Patient left: in bed;with call bell/phone within reach;with bed alarm set Nurse Communication: Mobility status PT Visit Diagnosis: Unsteadiness on  feet (R26.81);Muscle weakness (generalized) (M62.81);Difficulty in walking, not elsewhere classified (R26.2);Pain    Time: 1470-9295 PT Time Calculation (min) (ACUTE ONLY): 26 min   Charges:   PT Evaluation $PT Eval Moderate Complexity: 1  Mod          Shawnda Mauney B. Migdalia Dk PT, DPT Acute Rehabilitation Services Please use secure chat or  Call Office [phone]   [place] 03/09/2022, 3:45 PM

## 2022-03-09 NOTE — Consult Note (Signed)
WOC Nurse Consult Note: Reason for Consult:Bilateral anterior foot blisters (ruptured), partial thickness Wound type: venous insufficiency Pressure Injury POA:N/A Measurement:Bedside RN to measure with next dressing applied today and document on Nursing Flow Sheet Wound CBJ:SEGB, moist Drainage (amount, consistency, odor) serous Periwound: edematous Dressing procedure/placement/frequency: I will provide the patient with a mattress replacement with low air loss feature as she is not motivated to turn and reposition and this will mitigate both moisture and pressure. She is using the [employer] low friction coefficient bed linen system (CoF). Guidance is still provided for nursing to turn and reposition and minimize time in the supine position. A sacral prophylactic foam dressing is to be placed for PI prevention . Topical care to the anterior foot blisters will be to cover with xeroform nonadherent, antimicrobial dressings after cleansing daily and cover with dry gauze. This is to be secured with a few turns of Kerlix roll gauze and paper tape. The feet are to be placed into Prevalon pressure redistribution heel boots after first placing silicone foam dressing for the heels.  I discussed the POC with the patient's bedside RN, L. Cox whose assistance is appreciated.  [facility] nursing team will not follow, but will remain available to this patient, the nursing and medical teams.  Please re-consult if needed.  Thank you for inviting [place] to participate in this patient's Plan of Care.  Maudie Flakes, MSN, RN, CNS, [place], Serita Grammes, [employer], [employer] phone:  [phone]

## 2022-03-09 NOTE — Progress Notes (Signed)
[facility] PROGRESS NOTE  Julie Montgomery (DOB: 09-Jun-1935) STM:[id number] PCP: Cassandria Anger, MD  Brief Narrative: Julie Montgomery is an 86 y.o. female with a history of dementia, PAF, recurrent C diff, and chronic hyponatremia who presented 11/6 with decreased oral intake. Recent hospitalization from 10/25 to 02/28/22 for recurrent C diff colitis. She was discharge on oral vancomycin taper. At home her diarrhea was improving, taking imodium, though oral intake was very poor. She appeared dehydrated on exam with abdominal tenderness.    Na 129, K 4,3 CL 94 bicarbonate 23, glucose 104 bun 16 cr 0,49  Wbc 13,5 hgb 11,4 plt 356    Abdominal radiograph with marked dilatation of the ascending colon with interval worsening. Mild dilatation of the small bowel loops. Findings consistent with ileus.    CT abdomen and pelvis with persistent and mildly progressive colonic distention with air fluid levels, decreased caliber distal colon at the level of the mid sigmoid colon and increase caliber proximal small bowel loops with the left hemi abdomen. Fecalization of the prominent small bowel loops.    Chest radiograph with hyperinflation and small left pleural effusion, no infiltrates.   Subjective: Tolerating some liquids, ate all her jello this morning which is a large accomplishment per pt and husband. Abdominal pain is overall worse, however. No fevers. Tomorrow is her birthday.  Objective: BP 137/73 (BP Location: Left Arm)   Pulse 90   Temp 97.8 F (36.6 C) (Oral)   Resp 20   Ht '5\' 2"'$  (1.575 m)   Wt 49 kg   SpO2 98%   BMI 19.75 kg/m   Gen: Frail elderly female in no acute distress Pulm: Clear, nonlabored  CV: RRR, stable dependent edema GI: Soft, less distended, tender diffusely more than prior exams but no rigidity, guarding, or rebound tenderness. Neuro: Alert and essentially oriented. No new focal deficits. Ext: Warm, no deformities Skin: No rashes, lesions or ulcers on  visualized skin   Assessment & Plan: Ileus: Favored Dx in setting of imodium over toxic megacolon at this time with relatively modest leukocytosis (normalized as of 11/8), nontoxic appearance and exam.  - Leukocytosis slightly up today, pain worse, though abd remains soft and XR shows essentially resolution of gaseous distention.  - Continue diet per GI, starting TPN as well.  - Surgery signed off.    ETEC:  - Supportive care for this for now. No hematochezia.  Recurrent Clostridioides difficile infection: Repeat work up was negative.  - Continue PO vancomycin QID, DC flagyl per ID. Considering [facility]. Appreciate their recommendations.   - Patient will benefit from outpatient FMT at tertiary care center after discharge. - Contact isolation  Acute urinary retention, pyuria:  - Inserted foley, consider TOV once more clinically stable, 10/11. - Urine culture no growth   Dehydration, hypovolemic hyponatremia:  - Continue isotonic saline. Plan to DC this once TPN initiated.   Hypokalemia:  - Supplement in IVF as well as enterally given severity/refractory nature.   Hypomagnesemia: Resolved - Monitor  PAF: Patient not able to take PO meds due to ileus. Currently in NSR with soft BP. - Continue cardiac monitoring, IV metoprolol prn. - Given her risk factors and hx TIA, restarted anticoagulation. Unclear whether GI may pursue flex sig, etc. so keeping on parenteral anticoagulation for now.  - Once taking po reliably, restart home diltiazem, eliquis  Dementia:  - Continue namenda.  - Husband at bedside   Anemia of chronic disease: - No evidence of acute bleeding,  monitor intermittently.   Severe protein calorie malnutrition:  - RD consulted, supplement protein as much as possible. Since not keeping up with metabolic demands and GI losses (both acutely and recently), discussion with family, patient, and GI was had and decision to start TPN which is ordered. I will order PICC.    Vitamin D deficiency:  - Supplement 50k units weekly x8 weeks and recheck  [name], MD [facility] [email or web address] 03/09/2022, 3:57 PM

## 2022-03-09 NOTE — Progress Notes (Signed)
[place] for Infectious Disease  Date of Admission:  03/05/2022    Principal Problem:   Ileus ([place]) Active Problems:   Goals of care, counseling/discussion   Dehydration   Protein calorie malnutrition (HCC)   Paroxysmal atrial fibrillation (HCC)   Recurrent Clostridioides difficile infection   C. difficile diarrhea   Anemia of chronic disease   Protein-calorie malnutrition, severe   Abnormal x-ray of abdomen   Adynamic ileus ([place])          Assessment: 82 YF admitted with:  #Possible ileus/toxic megacolon in the setting of ETEC and imodium use #Negative Cdiff Ag/toxin, with Hx of recurrent cdiff treated with Zinplava #Diarrhea -Recent hospitalization 10/25 - 11/1 to [facility] for recurrent C. difficile discharged on long vancomycin taper and recommend to be seen at at [facility] or [facility] for FMT  -Pt admitted with dehydration. She reports that se had been taking imodium for the last 2 weeks.  -CT showed colonic distension with air-fluid levels, cannot exclude toxic megacolon -Surgery consulted noted plain film improvement, no plans for surgical intervention. DO not suspect toxic megacolon. I am in agreement that radiologic findings are in the setting of imodium.  -Cdiff testing negative on 11/10. She is lilky just colonized with c. Diff. Continues to have loose stools -Recieved azithromycin x 1 dose for ETEC    Recommendations: -Zithromax x 1 gm - Will transition to vanco bid x 5days to provide tail coverage then stop PO vancomycin( she has completed 14days of qid dosing) as it has made no difference in her symptoms, testing is negative(inconsistent in the past) -Recommend colonoscopy when able to look for other etiologies  ID will sign off Microbiology:   Antibiotics: Vanomcyin Metronidazole   Cultures: Blood Vancomycin 11/7-  SUBJECTIVE: Resting in bed. Continues to have loose stools Interval:  Afebrile wbc 10.6K Review of Systems: Review of Systems   All other systems reviewed and are negative.    Scheduled Meds:  (feeding supplement) PROSource Plus  30 mL Oral TID BM   Chlorhexidine Gluconate Cloth  6 each Topical Daily   enoxaparin (LOVENOX) injection  50 mg Subcutaneous Q12H   feeding supplement  1 Container Oral TID BM   potassium chloride  40 mEq Oral BID   vancomycin  125 mg Oral Q6H   Vitamin D (Ergocalciferol)  50,000 Units Oral Q7 days   Continuous Infusions:  dextrose 5 % and 0.9 % NaCl with KCl 20 mEq/L 50 mL/hr at 03/09/22 0942   PRN Meds:.acetaminophen **OR** acetaminophen, fentaNYL (SUBLIMAZE) injection, metoprolol tartrate, naLOXone (NARCAN)  injection, ondansetron (ZOFRAN) IV Allergies  Allergen Reactions   Lactose Intolerance (Gi) Other (See Comments)    Intolerance    Lovastatin Other (See Comments)    Unknown reaction   Mometasone Furo-Formoterol Fum Other (See Comments)    Loss of appetite, laryngitis    Peanut-Containing Drug Products Other (See Comments)   Prozac [Fluoxetine Hcl] Other (See Comments)    Jumpy   Sulfa Antibiotics Other (See Comments)    Unknown reaction    OBJECTIVE: Vitals:   03/08/22 1943 03/08/22 2335 03/08/22 2349 03/09/22 1128  BP: (!) 114/52 (!) 102/56 (!) 102/56 137/73  Pulse: 81 85 66 90  Resp: 20 (!) '21 20 20  '$ Temp: (!) 97.5 F (36.4 C)  98 F (36.7 C) 97.8 F (36.6 C)  TempSrc: Axillary  Oral Oral  SpO2: 95% 98% 99% 98%  Weight:      Height:  Body mass index is 19.75 kg/m.  Physical Exam Constitutional:      Appearance: Normal appearance.  HENT:     Head: Normocephalic and atraumatic.     Right Ear: Tympanic membrane normal.     Left Ear: Tympanic membrane normal.     Nose: Nose normal.     Mouth/Throat:     Mouth: Mucous membranes are moist.  Eyes:     Extraocular Movements: Extraocular movements intact.     Conjunctiva/sclera: Conjunctivae normal.     Pupils: Pupils are equal, round, and reactive to light.  Cardiovascular:     Rate and  Rhythm: Normal rate and regular rhythm.     Heart sounds: No murmur heard.    No friction rub. No gallop.  Pulmonary:     Effort: Pulmonary effort is normal.     Breath sounds: Normal breath sounds.  Abdominal:     General: Abdomen is flat.     Palpations: Abdomen is soft.  Musculoskeletal:        General: Normal range of motion.  Skin:    General: Skin is warm and dry.  Neurological:     General: No focal deficit present.     Mental Status: She is alert and oriented to person, place, and time.  Psychiatric:        Mood and Affect: Mood normal.       Lab Results Lab Results  Component Value Date   WBC 10.6 (H) 03/09/2022   HGB 10.0 (L) 03/09/2022   HCT 29.5 (L) 03/09/2022   MCV 89.4 03/09/2022   PLT 311 03/09/2022    Lab Results  Component Value Date   CREATININE 0.38 (L) 03/09/2022   BUN 13 03/09/2022   NA 130 (L) 03/09/2022   K 2.8 (L) 03/09/2022   CL 100 03/09/2022   CO2 24 03/09/2022    Lab Results  Component Value Date   ALT 17 03/09/2022   AST 13 (L) 03/09/2022   ALKPHOS 59 03/09/2022   BILITOT 0.3 03/09/2022        Laurice Record, [facility] 03/09/2022, 3:59 PM

## 2022-03-09 NOTE — Progress Notes (Signed)
ANTICOAGULATION CONSULT NOTE  Pharmacy Consult for enoxaparin Indication: atrial fibrillation  Allergies  Allergen Reactions   Lactose Intolerance (Gi) Other (See Comments)    Intolerance    Lovastatin Other (See Comments)    Unknown reaction   Mometasone Furo-Formoterol Fum Other (See Comments)    Loss of appetite, laryngitis    Peanut-Containing Drug Products Other (See Comments)   Prozac [Fluoxetine Hcl] Other (See Comments)    Jumpy   Sulfa Antibiotics Other (See Comments)    Unknown reaction    Patient Measurements: Height: '5\' 2"'$  (157.5 cm) Weight: 49 kg (108 lb) IBW/kg (Calculated) : 50.1  Vital Signs: Temp: 98 F (36.7 C) (11/09 2349) Temp Source: Oral (11/09 2349) BP: 102/56 (11/09 2349) Pulse Rate: 66 (11/09 2349)  Labs: Recent Labs    03/07/22 0303 03/08/22 0221 03/09/22 0147  HGB 10.7*  --  10.0*  HCT 32.4*  --  29.5*  PLT 328  --  311  CREATININE 0.53 0.44 0.38*     Estimated Creatinine Clearance: 41.2 mL/min (A) (by C-G formula based on SCr of 0.38 mg/dL (L)).   Medical History: Past Medical History:  Diagnosis Date   Adenomatous polyp of colon 2020   Allergy    Anxiety    Basal cell carcinoma (BCC)    Cataract    Depression    Helicobacter pylori gastritis    Osteopenia    Paroxysmal atrial fibrillation (HCC)    Pneumonia    hx of 03/2008   Stroke [facility])    TIA long time ago     Assessment: 28 yoF admitted with ileus and poor PO intake. Pt on apixaban at home for hx PAF - last dose 11/7 ~1100. Pharmacy asked to dose enoxaparin while unable to give PO meds reliably. CBC ok, Cr at BL <1.  Wt increased in chart, will adjust enoxaparin dose.  Goal of Therapy:  Anti-Xa level 0.6-1 units/ml 4hrs after LMWH dose given Monitor platelets by anticoagulation protocol: Yes   Plan:  Enoxaparin '50mg'$  (~'1mg'$ /kg) q12h  Arrie Senate, PharmD, BCPS, [facility] Clinical Pharmacist [phone] Please check AMION for all [facility]  numbers 03/09/2022

## 2022-03-09 NOTE — Progress Notes (Addendum)
Progress Note  Primary GI: Dr. Fuller Plan   Subjective  Chief Complaint: Possible ileus versus toxic megacolon and setting of recurrent C. difficile  Husband is not at bedside this visit. Continues to have bilateral leg swelling with weeping, blisters which are covered. Patient denies nausea, vomiting, fever or chills.  May be having worsening abdominal discomfort today but abdomen much softer, x-ray showed nonobstructive gas pattern and improving ileus. She has rectal tube in place but continues to leak around it, continues to have large-volume brown fecal incontinence.  No hematochezia no melena.    Objective   Vital signs in last 24 hours: Temp:  [97.5 F (36.4 C)-98 F (36.7 C)] 97.8 F (36.6 C) (11/10 1128) Pulse Rate:  [66-90] 90 (11/10 1128) Resp:  [20-23] 20 (11/10 1128) BP: (101-137)/(47-73) 137/73 (11/10 1128) SpO2:  [95 %-99 %] 98 % (11/10 1128) Last BM Date : 03/09/22 Last BM recorded by nurses in past 5 days Stool Type: Type 7 (Liquid consistency with no solid pieces) (03/08/2022  9:40 AM)  General: Cachectic female in no acute distress  Heart:  Regular rate and rhythm; no murmurs Pulm: Clear anteriorly; no wheezing Abdomen: Distended but soft,  improved bowel sounds . moderate tenderness in the entire abdomen. Without guarding and Without rebound, No organomegaly appreciated. Extremities:  with 2-3+ edema up to thigh, s blisters/weeping lower bilateral feet, left greater than right,no warmth, erythema.  Neurologic:  Alert and  oriented x2;  No focal deficits.  Psych:  Cooperative. Normal mood and affect.  Intake/Output from previous day: 11/09 0701 - 11/10 0700 In: 768.8 [I.V.:768.8] Out: -  Intake/Output this shift: Total I/O In: -  Out: 400 [Urine:400]  Studies/Results: DG Abd Portable 1V  Result Date: 03/09/2022 CLINICAL DATA:  Evaluate for ileus. EXAM: PORTABLE ABDOMEN - 1 VIEW COMPARISON:  03/08/22 FINDINGS: Interval improvement in gaseous distension  of the stomach. No dilated small bowel loops identified. Gas noted within the colon up to the level of the rectum. Calcified splenic artery aneurysm is again noted measuring 1.3 cm. Surgical clips within the right hemiabdomen, stable. IMPRESSION: 1. Interval improvement in gaseous distension of the stomach. 2. Nonobstructive bowel gas pattern. Electronically Signed   By: Kerby Moors M.D.   On: 03/09/2022 06:52   DG Abd Portable 1V  Result Date: 03/08/2022 CLINICAL DATA:  Ileus EXAM: PORTABLE ABDOMEN - 1 VIEW COMPARISON:  03/07/2022 FINDINGS: Interval decrease in gas distention of the stomach, small bowel, and colon, with gas present to the rectum. No substantial burden of stool. No free air in the abdomen on supine radiographs. IMPRESSION: Interval decrease in gas distention of the stomach, small bowel, and colon, with gas present to the rectum. Findings consistent with improved ileus. Electronically Signed   By: Delanna Ahmadi M.D.   On: 03/08/2022 08:30    Lab Results: Recent Labs    03/07/22 0303 03/09/22 0147  WBC 8.1 10.6*  HGB 10.7* 10.0*  HCT 32.4* 29.5*  PLT 328 311   BMET Recent Labs    03/07/22 0303 03/08/22 0221 03/09/22 0147  NA 131* 131* 130*  K 4.3 3.3* 2.8*  CL 98 99 100  CO2 '26 24 24  '$ GLUCOSE 109* 83 116*  BUN '16 12 13  '$ CREATININE 0.53 0.44 0.38*  CALCIUM 8.5* 8.0* 7.8*   LFT Recent Labs    03/09/22 0147  PROT 3.8*  ALBUMIN 1.5*  AST 13*  ALT 17  ALKPHOS 59  BILITOT 0.3   PT/INR No results  for input(s): "LABPROT", "INR" in the last 72 hours.   Scheduled Meds:  (feeding supplement) PROSource Plus  30 mL Oral TID BM   Chlorhexidine Gluconate Cloth  6 each Topical Daily   enoxaparin (LOVENOX) injection  50 mg Subcutaneous Q12H   feeding supplement  1 Container Oral TID BM   fiber supplement (BANATROL TF)  60 mL Oral BID   potassium chloride  40 mEq Oral BID   vancomycin  125 mg Oral Q6H   Vitamin D (Ergocalciferol)  50,000 Units Oral Q7 days    Continuous Infusions:  dextrose 5 % and 0.9 % NaCl with KCl 20 mEq/L 50 mL/hr at 03/09/22 1962      Patient profile:   86 y.o. female with past medical history significant for prior TIA, osteopenia, prior history of H. pylori gastritis, adenomatous colon polyps, bronchiectasis, and A-fib for which she is on Eliquis and history of multiple episodes of recurrent C. difficile colitis   Impression/Plan:   Diarrhea with weight loss, in setting of recurrent C. Difficile On vancomycin p.o.  ID consult appreciated, last testing 10/26 PCR negative, toxin positive Repeat quick cdiff screen negative 03/09/22  ETEC detected, appreciate ID input, not treating at this time. Would potentially benefit from [place] outpatient, pending ballast approval, ID is working on this. Last Colon 2020, consider repeat colonoscopy/flex sig in the future if Cdiff negative with diarrhea to evaluate for microscopic colitis   Likely colonic ileus in setting of ETEC an possible imodium use 03/09/2022 KUB interval improvement gaseous distention of the stomach, nonobstructive bowel pattern. -Possible worsening abdominal discomfort today, consider CT abdomen/pelvis if this worsens. -Continue to maintain magnesium above 2 and potassium at 4-4.5.  -Encourage early ambulation -Minimize narcotics and anticholinergic medications - on clear liquids, continues to advance diet as tolerated  Hypokalemia Replenished per primary likely secondary to diarrheal losses   Protein calorie malnutrition  With anasarca with anasarca and lower leg edema with weeping Albumin 03/09/2022  1.5  BMI body mass index is 19.75 kg/m.  - RD consult ed Count calories, increase protein currently on ensure Worsening albumin with worsening anasarca, uncertain if she will be able to keep up with GI losses, may benefit from TPN therapy consider starting.  Anasarca/lower leg edema with weeping Likely secondary to protein calorie malnutrition Consider  wound consult for leg ulcers/weeping [name] had prolonged hospital stays, has had significant weight loss,may benefit from TPN  Anemia of  chronic disease Without any GI bleeding   Paroxysmal atrial fibrillation On lovenox    Principal Problem:   Ileus ([place]) Active Problems:   Goals of care, counseling/discussion   Dehydration   Protein calorie malnutrition ([facility])   Paroxysmal atrial fibrillation (H[facility])   Recurrent Clostridioides difficile infection   C. difficile diarrhea   Anemia of chronic disease   Protein-calorie malnutrition, severe   Abnormal x-ray of abdomen   Adynamic ileus ([place])    LOS: 3 days   Julie Montgomery  03/09/2022, 11:42 AM  GI ATTENDING  Interval history data reviewed.  Patient seen and examined.  Agree with interval progress note as outlined above.  Clinical and radiographic resolution of ileus.  Still with loose stools.  Significant electrolyte abnormalities with hyponatremia and hypokalemia.  Primary service to address these abnormalities.  Treatment of cluster given to physical diarrhea per ID.  Being seen by nutrition.  No new plans or recommendations from GI perspective.  We are available on an as-needed basis.  We will sign off at this  time.  Docia Chuck. Geri Seminole., M.D. [facility] Division of Gastroenterology

## 2022-03-09 NOTE — Evaluation (Signed)
Occupational Therapy Evaluation Patient Details Name: Julie Montgomery MRN: [id number] DOB: [birth year] Today's Date: 03/09/2022   History of Present Illness Pt is an 86 year old woman who came to the ED on11/06 with distended abdomen, LE swelling, poor PO intake with dehydration. She was discharged 4 days prior with c-diff. Likely recurrent c-diff  and colonic ileus. PMH: CHF, dementia, PAF, chronic hyponatremia, anemia, TIA, osteopenia.   Clinical Impression   At her baseline, pt walks without a device and is independent in self care. She went home using a RW and needing min assist for ADLs last hospitalization. Pt presents with impaired cognition, likely baseline. Home information gathered through chart review. Pt willing to work with therapies despite abdominal pain and very grateful for help. Pt presents with generalized weakness and impaired standing balance. She needs total to +2 mod assist for bed level mobility, +2 mod assist to stand and +2 min assist for stepping at EOB. Pt requires set up to total assist for ADLs. Recommending HHOT and an aide, depending on family's ability to manage pt at home. Will follow acutely.      Recommendations for follow up therapy are one component of a multi-disciplinary discharge planning process, led by the attending physician.  Recommendations may be updated based on patient status, additional functional criteria and insurance authorization.   Follow Up Recommendations  Home health OT ([place])    Assistance Recommended at Discharge Frequent or constant Supervision/Assistance  Patient can return home with the following A lot of help with walking and/or transfers;A lot of help with bathing/dressing/bathroom;Assistance with cooking/housework;Direct supervision/assist for medications management;Direct supervision/assist for financial management;Assist for transportation;Help with stairs or ramp for entrance    Functional Status Assessment   Patient has had a recent decline in their functional status and demonstrates the ability to make significant improvements in function in a reasonable and predictable amount of time.  Equipment Recommendations  None recommended by OT    Recommendations for Other Services       Precautions / Restrictions Precautions Precautions: Fall Precaution Comments: rectal pouch      Mobility Bed Mobility Overal bed mobility: Needs Assistance Bed Mobility: Supine to Sit, Sit to Supine, Rolling Rolling: Max assist   Supine to sit: Total assist Sit to supine: Mod assist, +2 for physical assistance   General bed mobility comments: assist for all aspects supine>sit, guided trunk and assist for LEs back into bed, max assist to roll for pericare and to change bed pad, +2 total assist to pull up in bed    Transfers Overall transfer level: Needs assistance Equipment used: Rolling walker (2 wheels) Transfers: Sit to/from Stand Sit to Stand: +2 physical assistance, Mod assist           General transfer comment: cues and assist for hips to EOB prior to standing, assist to rise and steady with increased time, reported dizziness upon standing, but resolved, pt able to march in place and side step with +2 min assist      Balance Overall balance assessment: Needs assistance   Sitting balance-Leahy Scale: Fair     Standing balance support: Bilateral upper extremity supported, No upper extremity supported, During functional activity Standing balance-Leahy Scale: Poor Standing balance comment: B UE support of RW and min assist                           ADL either performed or assessed with clinical judgement  ADL Overall ADL's : Needs assistance/impaired Eating/Feeding: Set up;Sitting   Grooming: Minimal assistance;Sitting   Upper Body Bathing: Minimal assistance;Sitting   Lower Body Bathing: Total assistance;Sit to/from stand   Upper Body Dressing : Minimal  assistance;Sitting   Lower Body Dressing: Total assistance;Bed level       Toileting- Clothing Manipulation and Hygiene: Total assistance;Bed level       Functional mobility during ADLs: Minimal assistance;Rolling walker (2 wheels);+2 for safety/equipment (took steps along EOB, marched in place)       Vision Patient Visual Report: No change from baseline       Perception     Praxis      Pertinent Vitals/Pain Pain Assessment Pain Assessment: Faces Faces Pain Scale: Hurts even more Pain Location: abdomen Pain Descriptors / Indicators: Discomfort, Grimacing, Tightness, Sore Pain Intervention(s): Monitored during session, Repositioned     Hand Dominance Right   Extremity/Trunk Assessment Upper Extremity Assessment Upper Extremity Assessment: Generalized weakness   Lower Extremity Assessment Lower Extremity Assessment: Defer to PT evaluation   Cervical / Trunk Assessment Cervical / Trunk Assessment: Normal   Communication Communication Communication: HOH   Cognition Arousal/Alertness: Awake/alert Behavior During Therapy: Flat affect Overall Cognitive Status: No family/caregiver present to determine baseline cognitive functioning Area of Impairment: Memory, Problem solving, Awareness, Following commands, Safety/judgement                     Memory: Decreased short-term memory Following Commands: Follows one step commands with increased time     Problem Solving: Slow processing, Difficulty sequencing, Requires verbal cues       General Comments       Exercises     Shoulder Instructions      Home Living Family/patient expects to be discharged to:: Private residence Living Arrangements: Spouse/significant other Available Help at Discharge: Family Type of Home: House Home Access: Stairs to enter [employer] of Steps: 2 at garage without rail, 5-6 in the back with a rail   Home Layout: One level     Bathroom Shower/Tub: Emergency planning/management officer: Standard     Home Equipment: Conservation officer, nature (2 wheels)   Additional Comments: per chart review, pt unable to offer PLOF      Prior Functioning/Environment Prior Level of Function : Needs assist             Mobility Comments: walking with RW when released from hospital, typically walks without a device ADLs Comments: typically independent in self care, likely assisted for IADLs        OT Problem List: Decreased strength;Decreased activity tolerance;Impaired balance (sitting and/or standing);Decreased coordination;Decreased cognition;Decreased safety awareness;Decreased knowledge of use of DME or AE      OT Treatment/Interventions: Self-care/ADL training;DME and/or AE instruction;Therapeutic activities;Patient/family education;Balance training    OT Goals(Current goals can be found in the care plan section) Acute Rehab OT Goals OT Goal Formulation: With patient Time For Goal Achievement: 03/23/22 Potential to Achieve Goals: Good ADL Goals Pt Will Perform Eating: Independently;sitting Pt Will Perform Grooming: with min assist;standing Pt Will Perform Upper Body Bathing: with min assist;sitting Pt Will Perform Upper Body Dressing: with set-up;sitting Pt Will Transfer to Toilet: with min assist;ambulating;bedside commode Pt Will Perform Toileting - Clothing Manipulation and hygiene: with min assist;sit to/from stand Additional ADL Goal #1: Pt will perform bed mobility with min assist in preparation for ADLs.  OT Frequency: Min 2X/week    Co-evaluation PT/OT/SLP Co-Evaluation/Treatment: Yes Reason for Co-Treatment: For patient/therapist safety  OT goals addressed during session: ADL's and self-care      AM-PAC OT "6 Clicks" Daily Activity     Outcome Measure Help from another person eating meals?: A Little Help from another person taking care of personal grooming?: A Little Help from another person toileting, which includes using toliet,  bedpan, or urinal?: Total Help from another person bathing (including washing, rinsing, drying)?: A Lot Help from another person to put on and taking off regular upper body clothing?: A Little Help from another person to put on and taking off regular lower body clothing?: Total 6 Click Score: 13   End of Session Equipment Utilized During Treatment: Rolling walker (2 wheels)  Activity Tolerance: Patient limited by fatigue Patient left: in bed;with call bell/phone within reach;with bed alarm set  OT Visit Diagnosis: Unsteadiness on feet (R26.81);Other abnormalities of gait and mobility (R26.89);Muscle weakness (generalized) (M62.81);Other symptoms and signs involving cognitive function;Pain                Time: 7078-6754 OT Time Calculation (min): 19 min Charges:  OT General Charges $OT Visit: 1 Visit OT Evaluation $OT Eval Moderate Complexity: Olla, OTR/L Acute Rehabilitation Services Office: [phone]   Malka So 03/09/2022, 2:44 PM

## 2022-03-10 DIAGNOSIS — K567 Ileus, unspecified: Secondary | ICD-10-CM | POA: Diagnosis not present

## 2022-03-10 DIAGNOSIS — I480 Paroxysmal atrial fibrillation: Secondary | ICD-10-CM | POA: Diagnosis not present

## 2022-03-10 DIAGNOSIS — E860 Dehydration: Secondary | ICD-10-CM | POA: Diagnosis not present

## 2022-03-10 DIAGNOSIS — A498 Other bacterial infections of unspecified site: Secondary | ICD-10-CM | POA: Diagnosis not present

## 2022-03-10 LAB — CBC WITH DIFFERENTIAL/PLATELET
Abs Immature Granulocytes: 0.08 10*3/uL — ABNORMAL HIGH (ref 0.00–0.07)
Basophils Absolute: 0.1 10*3/uL (ref 0.0–0.1)
Basophils Relative: 1 %
Eosinophils Absolute: 0.1 10*3/uL (ref 0.0–0.5)
Eosinophils Relative: 0 %
HCT: 34.4 % — ABNORMAL LOW (ref 36.0–46.0)
Hemoglobin: 11.3 g/dL — ABNORMAL LOW (ref 12.0–15.0)
Immature Granulocytes: 1 %
Lymphocytes Relative: 8 %
Lymphs Abs: 1.1 10*3/uL (ref 0.7–4.0)
MCH: 30 pg (ref 26.0–34.0)
MCHC: 32.8 g/dL (ref 30.0–36.0)
MCV: 91.2 fL (ref 80.0–100.0)
Monocytes Absolute: 1.4 10*3/uL — ABNORMAL HIGH (ref 0.1–1.0)
Monocytes Relative: 10 %
Neutro Abs: 11.7 10*3/uL — ABNORMAL HIGH (ref 1.7–7.7)
Neutrophils Relative %: 80 %
Platelets: 386 10*3/uL (ref 150–400)
RBC: 3.77 MIL/uL — ABNORMAL LOW (ref 3.87–5.11)
RDW: 15.7 % — ABNORMAL HIGH (ref 11.5–15.5)
WBC: 14.4 10*3/uL — ABNORMAL HIGH (ref 4.0–10.5)
nRBC: 0 % (ref 0.0–0.2)

## 2022-03-10 LAB — COMPREHENSIVE METABOLIC PANEL
ALT: 17 U/L (ref 0–44)
AST: 18 U/L (ref 15–41)
Albumin: 1.8 g/dL — ABNORMAL LOW (ref 3.5–5.0)
Alkaline Phosphatase: 67 U/L (ref 38–126)
Anion gap: 6 (ref 5–15)
BUN: 10 mg/dL (ref 8–23)
CO2: 24 mmol/L (ref 22–32)
Calcium: 8.2 mg/dL — ABNORMAL LOW (ref 8.9–10.3)
Chloride: 100 mmol/L (ref 98–111)
Creatinine, Ser: 0.43 mg/dL — ABNORMAL LOW (ref 0.44–1.00)
GFR, Estimated: >60 mL/min (ref >60)
Glucose, Bld: 112 mg/dL — ABNORMAL HIGH (ref 70–99)
Potassium: 4.1 mmol/L (ref 3.5–5.1)
Sodium: 130 mmol/L — ABNORMAL LOW (ref 135–145)
Total Bilirubin: <0.1 mg/dL — ABNORMAL LOW (ref 0.3–1.2)
Total Protein: 4.4 g/dL — ABNORMAL LOW (ref 6.5–8.1)

## 2022-03-10 LAB — TRIGLYCERIDES: Triglycerides: 38 mg/dL (ref <150)

## 2022-03-10 LAB — PHOSPHORUS: Phosphorus: 1.5 mg/dL — ABNORMAL LOW (ref 2.5–4.6)

## 2022-03-10 LAB — MAGNESIUM: Magnesium: 1.4 mg/dL — ABNORMAL LOW (ref 1.7–2.4)

## 2022-03-10 MED ORDER — SODIUM PHOSPHATES 45 MMOLE/15ML IV SOLN
30.0000 mmol | Freq: Once | INTRAVENOUS | Status: AC
  Administered 2022-03-10: 30 mmol via INTRAVENOUS
  Filled 2022-03-10: qty 10

## 2022-03-10 MED ORDER — MAGNESIUM SULFATE 4 GM/100ML IV SOLN
4.0000 g | Freq: Once | INTRAVENOUS | Status: AC
  Administered 2022-03-10: 4 g via INTRAVENOUS
  Filled 2022-03-10: qty 100

## 2022-03-10 MED ORDER — MAGNESIUM SULFATE 2 GM/50ML IV SOLN
2.0000 g | Freq: Once | INTRAVENOUS | Status: AC
  Administered 2022-03-10: 2 g via INTRAVENOUS
  Filled 2022-03-10: qty 50

## 2022-03-10 MED ORDER — KCL IN DEXTROSE-NACL 20-5-0.9 MEQ/L-%-% IV SOLN
INTRAVENOUS | Status: DC
  Filled 2022-03-10: qty 1000

## 2022-03-10 MED ORDER — SODIUM CHLORIDE 0.9% FLUSH
10.0000 mL | INTRAVENOUS | Status: DC | PRN
  Administered 2022-03-16: 10 mL

## 2022-03-10 MED ORDER — SODIUM CHLORIDE 0.9% FLUSH
10.0000 mL | Freq: Two times a day (BID) | INTRAVENOUS | Status: DC
  Administered 2022-03-10 – 2022-03-24 (×28): 10 mL

## 2022-03-10 MED ORDER — TRAVASOL 10 % IV SOLN
INTRAVENOUS | Status: AC
  Filled 2022-03-10: qty 324

## 2022-03-10 NOTE — Progress Notes (Signed)
Requested Air Overlay/Low air loss mattress for patient per [employer] RN suggestions.  No Air Overlay/Low air loss mattresses available at this time.

## 2022-03-10 NOTE — Plan of Care (Signed)
  Problem: Education: Goal: Knowledge of General Education information will improve Description: Including pain rating scale, medication(s)/side effects and non-pharmacologic comfort measures Outcome: Progressing   Problem: Health Behavior/Discharge Planning: Goal: Ability to manage health-related needs will improve Outcome: Progressing   Problem: Clinical Measurements: Goal: Ability to maintain clinical measurements within normal limits will improve Outcome: Progressing   Problem: Clinical Measurements: Goal: Respiratory complications will improve Outcome: Progressing   Problem: Clinical Measurements: Goal: Cardiovascular complication will be avoided Outcome: Progressing   Problem: Activity: Goal: Risk for activity intolerance will decrease Outcome: Progressing   Problem: Nutrition: Goal: Adequate nutrition will be maintained Outcome: Progressing   Problem: Coping: Goal: Level of anxiety will decrease Outcome: Progressing   Problem: Pain Managment: Goal: General experience of comfort will improve Outcome: Progressing   Problem: Safety: Goal: Ability to remain free from injury will improve Outcome: Progressing   Problem: Elimination: Goal: Will not experience complications related to bowel motility Outcome: Progressing   Problem: Skin Integrity: Goal: Risk for impaired skin integrity will decrease Outcome: Progressing

## 2022-03-10 NOTE — Plan of Care (Signed)
  Problem: Education: Goal: Knowledge of General Education information will improve Description: Including pain rating scale, medication(s)/side effects and non-pharmacologic comfort measures Outcome: Progressing   Problem: Health Behavior/Discharge Planning: Goal: Ability to manage health-related needs will improve Outcome: Progressing   Problem: Clinical Measurements: Goal: Cardiovascular complication will be avoided Outcome: Progressing   Problem: Clinical Measurements: Goal: Ability to maintain clinical measurements within normal limits will improve Outcome: Progressing   Problem: Nutrition: Goal: Adequate nutrition will be maintained Outcome: Progressing   Problem: Coping: Goal: Level of anxiety will decrease Outcome: Progressing   Problem: Elimination: Goal: Will not experience complications related to bowel motility Outcome: Progressing   Problem: Elimination: Goal: Will not experience complications related to urinary retention Outcome: Progressing   Problem: Pain Managment: Goal: General experience of comfort will improve Outcome: Progressing   Problem: Safety: Goal: Ability to remain free from injury will improve Outcome: Progressing   Problem: Skin Integrity: Goal: Risk for impaired skin integrity will decrease Outcome: Progressing

## 2022-03-10 NOTE — Progress Notes (Signed)
Sondra RN notified PICC ready to use and aware to remove all unnecessary PIV's per protocol.

## 2022-03-10 NOTE — Progress Notes (Addendum)
[facility] PROGRESS NOTE  Julie Montgomery (DOB: [birth year]) VOP:[id number] PCP: Cassandria Anger, MD  Brief Narrative: Julie Montgomery is an 86 y.o. female with a history of dementia, PAF, recurrent C diff, and chronic hyponatremia who presented 11/6 with decreased oral intake. Recent hospitalization from 10/25 to 02/28/22 for recurrent C diff colitis. She was discharge on oral vancomycin taper. At home her diarrhea was improving, taking imodium, though oral intake was very poor. She appeared dehydrated on exam with abdominal tenderness.    Na 129, K 4,3 CL 94 bicarbonate 23, glucose 104 bun 16 cr 0,49  Wbc 13,5 hgb 11,4 plt 356    Abdominal radiograph with marked dilatation of the ascending colon with interval worsening. Mild dilatation of the small bowel loops. Findings consistent with ileus.    CT abdomen and pelvis with persistent and mildly progressive colonic distention with air fluid levels, decreased caliber distal colon at the level of the mid sigmoid colon and increase caliber proximal small bowel loops with the left hemi abdomen. Fecalization of the prominent small bowel loops.    Chest radiograph with hyperinflation and small left pleural effusion, no infiltrates.   Subjective: Wished her a happy birthday. Family will be coming in to see her. Abdominal discomfort is stable, not better or worse. Still having liquid output into rectal tube.  Objective: BP 104/75 (BP Location: Left Arm)   Pulse 77   Temp (!) 97.5 F (36.4 C) (Oral)   Resp 17   Ht '5\' 2"'$  (1.575 m)   Wt 49.2 kg   SpO2 99%   BMI 19.84 kg/m   Gen: Elderly, frail female in no distress Pulm: Clear, nonlabored  CV: RRR, no MRG, stable 1-2+ pitting LE edema, also symmetric UE hand swelling noted.  GI: Remains moderately diffusely tender with hyperactive bowel sounds. No rebound or rigidity  Neuro: Alert and conversant. No new focal deficits. Ext: Warm, no deformities Skin: No rashes, lesions or ulcers  on visualized skin   Assessment & Plan: Ileus: Favored Dx in setting of imodium over toxic megacolon at this time with relatively modest leukocytosis (normalized as of 11/8), nontoxic appearance and exam.  - Leukocytosis again rising today though remains afebrile. Nonobstructive bowel gas pattern on most recent XR.  - Continue diet per GI, starting TPN as well.  - Surgery signed off.    Diarrhea: Remains profuse requiring tube.  - Consider colonoscopy to investigate alternative diagnoses.   ETEC:  - s/p azithromycin x1 11/10. Supportive care for this for now. No hematochezia.  Recurrent Clostridioides difficile infection: Repeat work up was negative here, suspect colonization.  - Continue PO vancomycin down to BID for tail coverage per ID (final dose 11/15 pm), DC flagyl per ID. Considering [facility]. Appreciate their recommendations.   - Patient will benefit from outpatient FMT at tertiary care center after discharge. - Contact isolation  Acute urinary retention, pyuria: No UCx growth.  - Inserted foley, will pull and perform TOV today.    Dehydration, hypovolemic hyponatremia:  - DC IVF with more edema (hypoalbuminemia and immobility also contributing) and TPN   Hypokalemia:  - Supplement in TPN. Having slightly worsened swelling (multifactorial) so will stop IVF.   Hypomagnesemia:  - Repeat supplementation today  Hypophosphatemia:  - Supplement by IV and monitor closely with TPN  PAF: Patient not able to take PO meds due to ileus. Currently in NSR with soft BP. - Continue cardiac monitoring, IV metoprolol prn. - Given her risk factors and hx  TIA, restarted anticoagulation. Unclear whether GI may pursue flex sig, etc. so keeping on parenteral anticoagulation for now.  - Once taking po reliably, restart home diltiazem, eliquis  Dementia:  - Continue namenda.  - Husband, sons at bedside   Anemia of chronic disease: - No evidence of acute bleeding, monitor intermittently.    Severe protein calorie malnutrition:  - RD consulted, supplement protein as much as possible. Since not keeping up with metabolic demands and GI losses (both acutely and recently), discussion with family, patient, and GI was had and decision to start TPN which is ordered. This will start this evening after PICC placed (r/b/a to PICC placement discussed at length with pt/family)  Vitamin D deficiency:  - Supplement 50k units weekly x8 weeks started 11/8 and recheck  [name], MD [facility] [email or web address] 03/10/2022, 12:39 PM

## 2022-03-10 NOTE — Progress Notes (Addendum)
PHARMACY - TOTAL PARENTERAL NUTRITION CONSULT NOTE   Indication:  prolonged hospitalization with severe malnutrition/anasarca  Patient Measurements: Height: '5\' 2"'$  (157.5 cm) Weight: 49.2 kg (108 lb 7.5 oz) IBW/kg (Calculated) : 50.1 TPN AdjBW (KG): 41.3 Body mass index is 19.84 kg/m. Usual Weight: 49.5 kg  Assessment:  [name] with malnutrition d/t multiple factors including dementia, recurrent C diff with multiple admissions and GI intolerance, and difficulty chewing with ill-fitting dentures. Pt had poor PO intake prior to admit and reports nut allergy and lactose intolerance. Pharmacy consulted for TPN in setting of malnutrition and inability to meet needs with PO intake.    Glucose / Insulin: no A1c in chart- CBGs low 100s - no SSI  Electrolytes: Na 130, K 4.1, Mag 1.4, Phos 1.5 CoCa 10.0  -NaPhos 64mol x1 and Mag 2g IV x1 ordered by MD this AM  Renal: Scr 0.43. BUN 10  Hepatic: LFTs WNL. Trigs 38 (11/11) Intake / Output; MIVF: UO 0.7 ml/kg/hr; rectal tube in place for Cdiff output not charted  GI Imaging: 11/6 CTAP: persistent colonic distention , cannot exclude toxic megacolon 11/9 Ab Xray: decrease in gas distention, improved ileus  11/10 Ab Xray: improvement in gaseous distention  GI Surgeries / Procedures:   Central access: PICC to be placed 11/11 TPN start date: 03/10/2022  Nutritional Goals: Goal TPN rate is 65 mL/hr (provides 70 g of protein and 1464 kcals per day)  RD Assessment: Estimated Needs Total Energy Estimated Needs: 1300-1500 Total Protein Estimated Needs: 65-80g Total Fluid Estimated Needs: 1.3-1.5L  Current Nutrition:  Clear liquids 11/9>  Boost TID - 1x dose charted on 11/10 Prosource TID - 3x doses charted on 11/10   Plan:  Start TPN at 30 mL/hr at 1800 - 50% goal rate to provide 50% of needs (676 kcal and 32 g protein) - monitor closely for refeeding via electrolytes  -patient has reported nut allergy (peanuts) - low risk of cross  reactivity with SMOFLipid ingredient. Upon asking for rxn/details, pt and pt spouse do not remember pt having a nut allergy. Regardless, RN aware and will monitor for any cross-reaction with lipids.    Electrolytes in TPN: Na 560m/L, K 5026mL, Ca 5mE8m, Mg 5mEq71m and Phos 25mmo77m Cl:Ac 1:1 -Mag 4g IV x1 in addition to AM dose  Add standard MVI and trace elements to TPN Initiate Sensitive q4h SSI and adjust as needed  D/c fluids - ok w MD Monitor TPN labs on Mon/Thurs, and daily until stable    MadeliWilson SingermD Clinical Pharmacist 03/10/2022 7:51 AM

## 2022-03-10 NOTE — Progress Notes (Signed)
Peripherally Inserted Central Catheter Placement  The IV Nurse has discussed with the patient and/or persons authorized to consent for the patient, the purpose of this procedure and the potential benefits and risks involved with this procedure.  The benefits include less needle sticks, lab draws from the catheter, and the patient may be discharged home with the catheter. Risks include, but not limited to, infection, bleeding, blood clot (thrombus formation), and puncture of an artery; nerve damage and irregular heartbeat and possibility to perform a PICC exchange if needed/ordered by physician.  Alternatives to this procedure were also discussed.  Bard Power PICC patient education guide, fact sheet on infection prevention and patient information card has been provided to patient /or left at bedside.  Husband signed consent due to pt hx of dementia.  2 sons also at bedside agreeable to procedure.  PICC Placement Documentation  PICC Double Lumen 03/10/22 Right Brachial 32 cm 0 cm (Active)  Indication for Insertion or Continuance of Line Administration of hyperosmolar/irritating solutions (i.e. TPN, Vancomycin, etc.) 03/10/22 1319  Exposed Catheter (cm) 0 cm 03/10/22 1319  Site Assessment Clean, Dry, Intact 03/10/22 1319  Lumen #1 Status Flushed;Saline locked;Blood return noted 03/10/22 1319  Lumen #2 Status Flushed;Saline locked;Blood return noted 03/10/22 1319  Dressing Type Transparent;Securing device 03/10/22 1319  Dressing Status Antimicrobial disc in place;Clean, Dry, Intact 03/10/22 1319  Safety Lock Not Applicable 29/56/21 3086  Line Care Connections checked and tightened 03/10/22 1319  Line Adjustment (NICU/IV Team Only) No 03/10/22 1319  Dressing Intervention New dressing 03/10/22 1319  Dressing Change Due 03/17/22 03/10/22 1319       Rolena Infante 03/10/2022, 1:20 PM

## 2022-03-11 ENCOUNTER — Inpatient Hospital Stay (HOSPITAL_COMMUNITY): Payer: Medicare Other

## 2022-03-11 DIAGNOSIS — I480 Paroxysmal atrial fibrillation: Secondary | ICD-10-CM | POA: Diagnosis not present

## 2022-03-11 DIAGNOSIS — E860 Dehydration: Secondary | ICD-10-CM | POA: Diagnosis not present

## 2022-03-11 DIAGNOSIS — K567 Ileus, unspecified: Secondary | ICD-10-CM | POA: Diagnosis not present

## 2022-03-11 DIAGNOSIS — A498 Other bacterial infections of unspecified site: Secondary | ICD-10-CM | POA: Diagnosis not present

## 2022-03-11 LAB — CBC WITH DIFFERENTIAL/PLATELET
Abs Immature Granulocytes: 0.07 10*3/uL (ref 0.00–0.07)
Basophils Absolute: 0 10*3/uL (ref 0.0–0.1)
Basophils Relative: 0 %
Eosinophils Absolute: 0.1 10*3/uL (ref 0.0–0.5)
Eosinophils Relative: 1 %
HCT: 31.3 % — ABNORMAL LOW (ref 36.0–46.0)
Hemoglobin: 10.6 g/dL — ABNORMAL LOW (ref 12.0–15.0)
Immature Granulocytes: 1 %
Lymphocytes Relative: 10 %
Lymphs Abs: 1 10*3/uL (ref 0.7–4.0)
MCH: 30.5 pg (ref 26.0–34.0)
MCHC: 33.9 g/dL (ref 30.0–36.0)
MCV: 90.2 fL (ref 80.0–100.0)
Monocytes Absolute: 1 10*3/uL (ref 0.1–1.0)
Monocytes Relative: 10 %
Neutro Abs: 8 10*3/uL — ABNORMAL HIGH (ref 1.7–7.7)
Neutrophils Relative %: 78 %
Platelets: 267 10*3/uL (ref 150–400)
RBC: 3.47 MIL/uL — ABNORMAL LOW (ref 3.87–5.11)
RDW: 15.5 % (ref 11.5–15.5)
WBC: 10.1 10*3/uL (ref 4.0–10.5)
nRBC: 0 % (ref 0.0–0.2)

## 2022-03-11 LAB — PHOSPHORUS: Phosphorus: 1.8 mg/dL — ABNORMAL LOW (ref 2.5–4.6)

## 2022-03-11 LAB — BASIC METABOLIC PANEL
Anion gap: 6 (ref 5–15)
BUN: 9 mg/dL (ref 8–23)
CO2: 26 mmol/L (ref 22–32)
Calcium: 7.5 mg/dL — ABNORMAL LOW (ref 8.9–10.3)
Chloride: 96 mmol/L — ABNORMAL LOW (ref 98–111)
Creatinine, Ser: <0.3 mg/dL — ABNORMAL LOW (ref 0.44–1.00)
Glucose, Bld: 117 mg/dL — ABNORMAL HIGH (ref 70–99)
Potassium: 2.8 mmol/L — ABNORMAL LOW (ref 3.5–5.1)
Sodium: 128 mmol/L — ABNORMAL LOW (ref 135–145)

## 2022-03-11 LAB — GLUCOSE, CAPILLARY: Glucose-Capillary: 159 mg/dL — ABNORMAL HIGH (ref 70–99)

## 2022-03-11 MED ORDER — POTASSIUM CHLORIDE 10 MEQ/100ML IV SOLN
10.0000 meq | INTRAVENOUS | Status: AC
  Administered 2022-03-11 (×6): 10 meq via INTRAVENOUS
  Filled 2022-03-11 (×5): qty 100

## 2022-03-11 MED ORDER — SODIUM PHOSPHATES 45 MMOLE/15ML IV SOLN
30.0000 mmol | Freq: Once | INTRAVENOUS | Status: AC
  Administered 2022-03-11: 30 mmol via INTRAVENOUS
  Filled 2022-03-11: qty 10

## 2022-03-11 MED ORDER — POTASSIUM CHLORIDE CRYS ER 10 MEQ PO TBCR
10.0000 meq | EXTENDED_RELEASE_TABLET | Freq: Once | ORAL | Status: AC
  Administered 2022-03-11: 10 meq via ORAL
  Filled 2022-03-11: qty 1

## 2022-03-11 MED ORDER — TRAVASOL 10 % IV SOLN
INTRAVENOUS | Status: AC
  Filled 2022-03-11: qty 324

## 2022-03-11 NOTE — Consult Note (Signed)
WOC Nurse Consult Note: Reason for Consult:DTPI to sacrum Wound type:Pressure Pressure Injury POA: No Measurement:4cm x 4cm area of maroon discoloration, non blanching Wound bed:N/A Drainage (amount, consistency, odor) N/A Periwound:intact Dressing procedure/placement/frequency: Turning and repositioning is in place. I have provided guidance for the care of this area using a silicon foam dressing that is to be lifted twice daily for assessments. The foam is to be replaced daily and PRN soiling. A mattress replacement with low air loss feature has been requested.  [place] nursing team will follow, seeing every 7-10 days and will remain available to this patient, the nursing and medical teams.  Please re-consult if needed between visits.  Thank you for inviting [place] to participate in this patient's Plan of Care.  Maudie Flakes, MSN, RN, CNS, [place], Serita Grammes, [employer], [employer] phone:  [phone]

## 2022-03-11 NOTE — Progress Notes (Signed)
PHARMACY - TOTAL PARENTERAL NUTRITION CONSULT NOTE   Indication:  prolonged hospitalization with severe malnutrition/anasarca  Patient Measurements: Height: '5\' 2"'$  (157.5 cm) Weight: 50.8 kg (111 lb 15.9 oz) IBW/kg (Calculated) : 50.1 TPN AdjBW (KG): 41.3 Body mass index is 20.48 kg/m. Usual Weight: 49.5 kg  Assessment:  [name] with malnutrition d/t multiple factors including dementia, recurrent C diff with multiple admissions and GI intolerance, and difficulty chewing with ill-fitting dentures. Pt had poor PO intake prior to admit and reports nut allergy and lactose intolerance. Pharmacy consulted for TPN in setting of malnutrition and inability to meet needs with PO intake.    Glucose / Insulin: no A1c in chart- CBGs low 100s - no SSI  Electrolytes: Na 128, K 2.8, Mag 1.4 (11/10), Phos 1.8,  CoCa 9.3, Cl 96  -received NaPhos 20mol x1; Mag 6g IV; Kcl 442m x2 doses on 11/11   Renal: Scr <0.30 BUN 9  Hepatic: LFTs WNL. Trigs 38 (11/11) Intake / Output; MIVF: UO 0.7 ml/kg/hr; rectal tube in place for Cdiff 25035mharted  GI Imaging: 11/6 CTAP: persistent colonic distention , cannot exclude toxic megacolon 11/9 Ab Xray: decrease in gas distention, improved ileus  11/10 Ab Xray: improvement in gaseous distention  GI Surgeries / Procedures:   Central access: PICC to be placed 11/11 TPN start date: 03/10/2022  Nutritional Goals: Goal TPN rate is 65 mL/hr (provides 70 g of protein and 1464 kcals per day)  RD Assessment: Estimated Needs Total Energy Estimated Needs: 1300-1500 Total Protein Estimated Needs: 65-80g Total Fluid Estimated Needs: 1.3-1.5L  Current Nutrition:  Clear liquids 11/9>  Boost TID - 3x dose charted on 11/11 Prosource TID - 3x doses charted on 11/11  Plan:  Continue TPN at 30 mL/hr at 1800 - 50% goal rate to provide 50% of needs (676 kcal and 32 g protein) - will not advance d/t electrolyte abnormalities -patient has reported nut allergy (peanuts) - low  risk of cross reactivity with SMOFLipid ingredient. Upon asking for rxn/details, pt and pt spouse do not remember pt having a nut allergy. Regardless, RN aware and will monitor for any cross-reaction with lipids.    Electrolytes in TPN: Na 46m79m, K 46mE98m Ca 5mEq/59mMg 10 mEq/L, and Phos 30mmol5mmax). Cl:Ac 2:1 -NaPhos 30mmol 50mKcl 10meq IV25m -Kcl 10meq PO 81mBMET @ 2000 for further replacement Add standard MVI and trace elements to TPN Initiate Sensitive q4h SSI and adjust as needed  Monitor TPN labs on Mon/Thurs, and daily until stable    Calvin Chura MWilson Singerlinical Pharmacist 03/11/2022 7:37 AM

## 2022-03-11 NOTE — Progress Notes (Signed)
ANTICOAGULATION CONSULT NOTE  Pharmacy Consult for enoxaparin Indication: atrial fibrillation  Allergies  Allergen Reactions   Lactose Intolerance (Gi) Other (See Comments)    Intolerance    Lovastatin Other (See Comments)    Unknown reaction   Mometasone Furo-Formoterol Fum Other (See Comments)    Loss of appetite, laryngitis    Peanut-Containing Drug Products Other (See Comments)   Prozac [Fluoxetine Hcl] Other (See Comments)    Jumpy   Sulfa Antibiotics Other (See Comments)    Unknown reaction    Patient Measurements: Height: '5\' 2"'$  (157.5 cm) Weight: 50.8 kg (111 lb 15.9 oz) IBW/kg (Calculated) : 50.1  Vital Signs: Temp: 97.7 F (36.5 C) (11/12 0330) Temp Source: Oral (11/12 0330) BP: 109/58 (11/12 0330) Pulse Rate: 76 (11/12 0330)  Labs: Recent Labs    03/09/22 0147 03/10/22 0121 03/11/22 0615  HGB 10.0* 11.3* 10.6*  HCT 29.5* 34.4* 31.3*  PLT 311 386 267  CREATININE 0.38* 0.43*  --      Estimated Creatinine Clearance: 41.4 mL/min (A) (by C-G formula based on SCr of 0.43 mg/dL (L)).   Medical History: Past Medical History:  Diagnosis Date   Adenomatous polyp of colon 2020   Allergy    Anxiety    Basal cell carcinoma (BCC)    Cataract    Depression    Helicobacter pylori gastritis    Osteopenia    Paroxysmal atrial fibrillation (HCC)    Pneumonia    hx of 03/2008   Stroke [facility])    TIA long time ago     Assessment: 85 yoF admitted with ileus and poor PO intake. Pt on apixaban at home for hx PAF - last dose 11/7 ~1100. Pharmacy asked to dose enoxaparin while unable to give PO meds reliably. CBC ok, Cr at BL <1.  Currently in NSR. No signs or symptoms of bleeding noted.   Goal of Therapy:  Anti-Xa level 0.6-1 units/ml 4hrs after LMWH dose given Monitor platelets by anticoagulation protocol: Yes   Plan:  Continue enoxaparin '50mg'$  (~'1mg'$ /kg) q12h Monitor CBC, s/sx of bleeding F/u GI plans, ability to transition to back to [place], PharmD Pharmacy Resident  03/11/2022 8:11 AM

## 2022-03-11 NOTE — Plan of Care (Signed)
  Problem: Education: Goal: Knowledge of General Education information will improve Description: Including pain rating scale, medication(s)/side effects and non-pharmacologic comfort measures Outcome: Progressing   Problem: Health Behavior/Discharge Planning: Goal: Ability to manage health-related needs will improve Outcome: Progressing   Problem: Clinical Measurements: Goal: Respiratory complications will improve Outcome: Progressing   Problem: Clinical Measurements: Goal: Cardiovascular complication will be avoided Outcome: Progressing   Problem: Nutrition: Goal: Adequate nutrition will be maintained Outcome: Progressing   Problem: Activity: Goal: Risk for activity intolerance will decrease Outcome: Progressing   Problem: Coping: Goal: Level of anxiety will decrease Outcome: Progressing   Problem: Elimination: Goal: Will not experience complications related to bowel motility Outcome: Progressing   Problem: Pain Managment: Goal: General experience of comfort will improve Outcome: Progressing   Problem: Safety: Goal: Ability to remain free from injury will improve Outcome: Progressing   Problem: Skin Integrity: Goal: Risk for impaired skin integrity will decrease Outcome: Progressing

## 2022-03-11 NOTE — Progress Notes (Signed)
[facility] PROGRESS NOTE  Julie Montgomery (DOB: [birth year]) WUJ:[id number] PCP: Cassandria Anger, MD  Brief Narrative: Julie Montgomery is an 86 y.o. female with a history of dementia, PAF, recurrent C diff, and chronic hyponatremia who presented 11/6 with decreased oral intake. Recent hospitalization from 10/25 to 02/28/22 for recurrent C diff colitis. She was discharge on oral vancomycin taper. At home her diarrhea was improving, taking imodium, though oral intake was very poor. She appeared dehydrated on exam with abdominal tenderness.    Na 129, K 4,3 CL 94 bicarbonate 23, glucose 104 bun 16 cr 0,49  Wbc 13,5 hgb 11,4 plt 356    Abdominal radiograph with marked dilatation of the ascending colon with interval worsening. Mild dilatation of the small bowel loops. Findings consistent with ileus.    CT abdomen and pelvis with persistent and mildly progressive colonic distention with air fluid levels, decreased caliber distal colon at the level of the mid sigmoid colon and increase caliber proximal small bowel loops with the left hemi abdomen. Fecalization of the prominent small bowel loops.    Chest radiograph with hyperinflation and small left pleural effusion, no infiltrates.   Subjective: Appreciate of care they're getting, PICC went in without issues. Feels very weak and worried that her abdomen still feels very bloated. No change to significant volume watery diarrhea through tube. Passed voiding trial. No fevers or chills.  Objective: BP 119/68   Pulse 76   Temp 97.7 F (36.5 C) (Oral)   Resp (!) 21   Ht '5\' 2"'$  (1.575 m)   Wt 50.8 kg   SpO2 96%   BMI 20.48 kg/m   Gen: Frail, thin elderly female in no distress Pulm: Clear, nonlabored  CV: RRR, stable pitting dependent and diffuse edema.  GI: Diffusely tender with very mild distention, greatly improved from prior. +BS. No rebound. Neuro: Alert and oriented. No new focal deficits. Ext: Warm, no deformities. PICC RUA  appears normal Skin: No rashes, lesions or ulcers on visualized skin   Assessment & Plan: Ileus: Favored Dx in setting of imodium over toxic megacolon at this time with relatively modest leukocytosis (normalized as of 11/8), nontoxic appearance and exam.  - Leukocytosis normalized again, remains afebrile. Pain still significant, pt not feeling progress is being made. XR consistent with improving exam/distention, but still very profuse diarrhea.  - Continue diet, TPN.  - Surgery signed off.    Diarrhea: Remains profuse requiring tube.  - If continues, will reach out to GI to consider work up for alternative diagnoses.   ETEC:  - s/p azithromycin x1 11/10. Supportive care for this for now. No hematochezia.  Recurrent Clostridioides difficile infection: Repeat work up was negative here, suspect colonization.  - Continue PO vancomycin down to BID for tail coverage per ID (final dose 11/15 pm), DC flagyl per ID. Considering Vowst.   - Patient may benefit from outpatient FMT at tertiary care center after discharge. - Contact isolation  Acute urinary retention, pyuria: No UCx growth. Passed TOV 11/11.   Dehydration, hypovolemic hyponatremia:  - DC IVF with more edema (hypoalbuminemia and immobility also contributing) and TPN   Hypokalemia:  - Supplementing in TPN.    Hypomagnesemia:  - Repeat supplementation today  Hypophosphatemia:  - Supplement again today, continue intensive monitoring.  PAF: Patient not able to take PO meds due to ileus. Currently in NSR with soft BP. - Continue cardiac monitoring, IV metoprolol prn. - Given her risk factors and hx TIA, restarted anticoagulation. Unclear  whether GI may pursue flex sig, etc. so keeping on parenteral anticoagulation for now. Will reach back out to them as above. - Once taking po reliably, restart home diltiazem, eliquis  Dementia: Appears mild. - Continue namenda.  - Husband, son at bedside   Anemia of chronic disease: - No  evidence of acute bleeding, monitor intermittently.   Severe protein calorie malnutrition:  - Continue TPN, started 11/11 thru PICC placed 11/11.   Vitamin D deficiency:  - Supplement 50k units weekly x8 weeks started 11/8 and recheck  [name], MD [facility] [email or web address] 03/11/2022, 2:33 PM

## 2022-03-12 ENCOUNTER — Telehealth: Payer: Self-pay | Admitting: Nurse Practitioner

## 2022-03-12 DIAGNOSIS — E876 Hypokalemia: Secondary | ICD-10-CM | POA: Diagnosis not present

## 2022-03-12 DIAGNOSIS — R9389 Abnormal findings on diagnostic imaging of other specified body structures: Secondary | ICD-10-CM | POA: Diagnosis not present

## 2022-03-12 DIAGNOSIS — K567 Ileus, unspecified: Secondary | ICD-10-CM | POA: Diagnosis not present

## 2022-03-12 DIAGNOSIS — R601 Generalized edema: Secondary | ICD-10-CM

## 2022-03-12 DIAGNOSIS — A0472 Enterocolitis due to Clostridium difficile, not specified as recurrent: Secondary | ICD-10-CM

## 2022-03-12 DIAGNOSIS — E871 Hypo-osmolality and hyponatremia: Secondary | ICD-10-CM

## 2022-03-12 DIAGNOSIS — I480 Paroxysmal atrial fibrillation: Secondary | ICD-10-CM | POA: Diagnosis not present

## 2022-03-12 DIAGNOSIS — E860 Dehydration: Secondary | ICD-10-CM | POA: Diagnosis not present

## 2022-03-12 DIAGNOSIS — A498 Other bacterial infections of unspecified site: Secondary | ICD-10-CM | POA: Diagnosis not present

## 2022-03-12 LAB — COMPREHENSIVE METABOLIC PANEL
ALT: 13 U/L (ref 0–44)
AST: 14 U/L — ABNORMAL LOW (ref 15–41)
Albumin: 1.6 g/dL — ABNORMAL LOW (ref 3.5–5.0)
Alkaline Phosphatase: 77 U/L (ref 38–126)
Anion gap: 7 (ref 5–15)
BUN: 9 mg/dL (ref 8–23)
CO2: 26 mmol/L (ref 22–32)
Calcium: 7.8 mg/dL — ABNORMAL LOW (ref 8.9–10.3)
Chloride: 96 mmol/L — ABNORMAL LOW (ref 98–111)
Creatinine, Ser: <0.3 mg/dL — ABNORMAL LOW (ref 0.44–1.00)
Glucose, Bld: 138 mg/dL — ABNORMAL HIGH (ref 70–99)
Potassium: 3.8 mmol/L (ref 3.5–5.1)
Sodium: 129 mmol/L — ABNORMAL LOW (ref 135–145)
Total Bilirubin: 0.1 mg/dL — ABNORMAL LOW (ref 0.3–1.2)
Total Protein: 4.2 g/dL — ABNORMAL LOW (ref 6.5–8.1)

## 2022-03-12 LAB — CBC WITH DIFFERENTIAL/PLATELET
Abs Immature Granulocytes: 0.08 10*3/uL — ABNORMAL HIGH (ref 0.00–0.07)
Basophils Absolute: 0 10*3/uL (ref 0.0–0.1)
Basophils Relative: 0 %
Eosinophils Absolute: 0.1 10*3/uL (ref 0.0–0.5)
Eosinophils Relative: 1 %
HCT: 30.7 % — ABNORMAL LOW (ref 36.0–46.0)
Hemoglobin: 10.1 g/dL — ABNORMAL LOW (ref 12.0–15.0)
Immature Granulocytes: 1 %
Lymphocytes Relative: 12 %
Lymphs Abs: 1 10*3/uL (ref 0.7–4.0)
MCH: 29.8 pg (ref 26.0–34.0)
MCHC: 32.9 g/dL (ref 30.0–36.0)
MCV: 90.6 fL (ref 80.0–100.0)
Monocytes Absolute: 0.9 10*3/uL (ref 0.1–1.0)
Monocytes Relative: 10 %
Neutro Abs: 6.3 10*3/uL (ref 1.7–7.7)
Neutrophils Relative %: 76 %
Platelets: 235 10*3/uL (ref 150–400)
RBC: 3.39 MIL/uL — ABNORMAL LOW (ref 3.87–5.11)
RDW: 15.6 % — ABNORMAL HIGH (ref 11.5–15.5)
WBC: 8.3 10*3/uL (ref 4.0–10.5)
nRBC: 0 % (ref 0.0–0.2)

## 2022-03-12 LAB — GLUCOSE, CAPILLARY
Glucose-Capillary: 154 mg/dL — ABNORMAL HIGH (ref 70–99)
Glucose-Capillary: 135 mg/dL — ABNORMAL HIGH (ref 70–99)
Glucose-Capillary: 125 mg/dL — ABNORMAL HIGH (ref 70–99)
Glucose-Capillary: 223 mg/dL — ABNORMAL HIGH (ref 70–99)
Glucose-Capillary: 104 mg/dL — ABNORMAL HIGH (ref 70–99)
Glucose-Capillary: 103 mg/dL — ABNORMAL HIGH (ref 70–99)

## 2022-03-12 LAB — MAGNESIUM
Magnesium: 1.8 mg/dL (ref 1.7–2.4)
Magnesium: 1.6 mg/dL — ABNORMAL LOW (ref 1.7–2.4)

## 2022-03-12 LAB — BASIC METABOLIC PANEL
Anion gap: 8 (ref 5–15)
BUN: 9 mg/dL (ref 8–23)
CO2: 27 mmol/L (ref 22–32)
Calcium: 8 mg/dL — ABNORMAL LOW (ref 8.9–10.3)
Chloride: 94 mmol/L — ABNORMAL LOW (ref 98–111)
Creatinine, Ser: 0.32 mg/dL — ABNORMAL LOW (ref 0.44–1.00)
GFR, Estimated: >60 mL/min (ref >60)
Glucose, Bld: 149 mg/dL — ABNORMAL HIGH (ref 70–99)
Potassium: 3.7 mmol/L (ref 3.5–5.1)
Sodium: 129 mmol/L — ABNORMAL LOW (ref 135–145)

## 2022-03-12 LAB — TRIGLYCERIDES: Triglycerides: 70 mg/dL (ref <150)

## 2022-03-12 LAB — PHOSPHORUS
Phosphorus: 3.3 mg/dL (ref 2.5–4.6)
Phosphorus: 2.6 mg/dL (ref 2.5–4.6)

## 2022-03-12 LAB — HEMOGLOBIN A1C
Hgb A1c MFr Bld: 5.4 % (ref 4.8–5.6)
Mean Plasma Glucose: 108.28 mg/dL

## 2022-03-12 MED ORDER — FUROSEMIDE 10 MG/ML IJ SOLN
40.0000 mg | Freq: Once | INTRAMUSCULAR | Status: AC
  Administered 2022-03-12: 40 mg via INTRAVENOUS
  Filled 2022-03-12: qty 4

## 2022-03-12 MED ORDER — MAGNESIUM SULFATE 2 GM/50ML IV SOLN
2.0000 g | Freq: Once | INTRAVENOUS | Status: AC
  Administered 2022-03-12: 2 g via INTRAVENOUS
  Filled 2022-03-12: qty 50

## 2022-03-12 MED ORDER — TRAVASOL 10 % IV SOLN
INTRAVENOUS | Status: AC
  Filled 2022-03-12: qty 702

## 2022-03-12 MED ORDER — INSULIN ASPART 100 UNIT/ML IJ SOLN
0.0000 [IU] | INTRAMUSCULAR | Status: DC
  Administered 2022-03-12: 1 [IU] via SUBCUTANEOUS
  Administered 2022-03-12: 3 [IU] via SUBCUTANEOUS
  Administered 2022-03-13 (×2): 2 [IU] via SUBCUTANEOUS
  Administered 2022-03-13 – 2022-03-14 (×5): 1 [IU] via SUBCUTANEOUS

## 2022-03-12 NOTE — Progress Notes (Signed)
Occupational Therapy Treatment Patient Details Name: Julie Montgomery MRN: [id number] DOB: May 09, 1935 Today's Date: 03/12/2022   History of present illness Pt is an 86 year old woman who came to the ED on11/06 with distended abdomen, LE swelling, poor PO intake with dehydration. She was discharged 4 days prior with c-diff. Likely recurrent c-diff  and colonic ileus. PMH: CHF, dementia, PAF, chronic hyponatremia, anemia, TIA, osteopenia.   OT comments  Pt needing min-total A for ADLs, mod A for bed mobility, and min A for step pivot transfer to chair with 1 person HHA. Pt benefitting from increased encouragement with mobility. Session limited due to pt's rectal tube disconnecting, RN and NT in room at end of session. Pt presenting with impairments listed below, will follow acutely. Continue to recommend HHOT at d/c.   Recommendations for follow up therapy are one component of a multi-disciplinary discharge planning process, led by the attending physician.  Recommendations may be updated based on patient status, additional functional criteria and insurance authorization.    Follow Up Recommendations  Home health OT [facility])     Assistance Recommended at Discharge Frequent or constant Supervision/Assistance  Patient can return home with the following  A lot of help with walking and/or transfers;A lot of help with bathing/dressing/bathroom;Assistance with cooking/housework;Direct supervision/assist for medications management;Direct supervision/assist for financial management;Assist for transportation;Help with stairs or ramp for entrance   Equipment Recommendations  None recommended by OT    Recommendations for Other Services      Precautions / Restrictions Precautions Precautions: Fall Precaution Comments: rectal pouch Restrictions Weight Bearing Restrictions: No       Mobility Bed Mobility Overal bed mobility: Needs Assistance Bed Mobility: Sidelying to Sit   Sidelying to sit:  Mod assist       General bed mobility comments: assist to scoot to EOB with use of pad    Transfers Overall transfer level: Needs assistance Equipment used: 1 person hand held assist Transfers: Sit to/from Stand, Bed to chair/wheelchair/BSC Sit to Stand: Min assist     Step pivot transfers: Min assist           Balance Overall balance assessment: Needs assistance Sitting-balance support: Feet supported Sitting balance-Leahy Scale: Fair     Standing balance support: Bilateral upper extremity supported, No upper extremity supported, During functional activity Standing balance-Leahy Scale: Poor Standing balance comment: B UE support of RW and min assist                           ADL either performed or assessed with clinical judgement   ADL Overall ADL's : Needs assistance/impaired                 Upper Body Dressing : Moderate assistance;Sitting   Lower Body Dressing: Total assistance;Sitting/lateral leans   Toilet Transfer: Minimal assistance;Cueing for safety;Cueing for sequencing;Stand-pivot;BSC/3in1;Rolling walker (2 wheels)           Functional mobility during ADLs: Minimal assistance      Extremity/Trunk Assessment Upper Extremity Assessment Upper Extremity Assessment: Generalized weakness   Lower Extremity Assessment Lower Extremity Assessment: Defer to PT evaluation        Vision   Vision Assessment?: No apparent visual deficits   Perception     Praxis      Cognition Arousal/Alertness: Awake/alert Behavior During Therapy: Flat affect Overall Cognitive Status: No family/caregiver present to determine baseline cognitive functioning Area of Impairment: Memory, Problem solving, Awareness, Following commands, Safety/judgement  Following Commands: Follows one step commands with increased time     Problem Solving: Slow processing, Difficulty sequencing, Requires verbal cues          Exercises       Shoulder Instructions       General Comments VSS on RA    Pertinent Vitals/ Pain       Pain Assessment Pain Assessment: Faces Pain Score: 3  Faces Pain Scale: Hurts little more Pain Location: back Pain Descriptors / Indicators: Discomfort Pain Intervention(s): Limited activity within patient's tolerance, Monitored during session, Repositioned  Home Living                                          Prior Functioning/Environment              Frequency  Min 2X/week        Progress Toward Goals  OT Goals(current goals can now be found in the care plan section)  Progress towards OT goals: Progressing toward goals  Acute Rehab OT Goals Patient Stated Goal: none stated OT Goal Formulation: With patient Time For Goal Achievement: 03/23/22 Potential to Achieve Goals: Good ADL Goals Pt Will Perform Eating: Independently;sitting Pt Will Perform Grooming: with min assist;standing Pt Will Perform Upper Body Bathing: with min assist;sitting Pt Will Perform Upper Body Dressing: with set-up;sitting Pt Will Transfer to Toilet: with min assist;ambulating;bedside commode Pt Will Perform Toileting - Clothing Manipulation and hygiene: with min assist;sit to/from stand Additional ADL Goal #1: Pt will perform bed mobility with min assist in preparation for ADLs.  Plan Discharge plan remains appropriate;Frequency remains appropriate    Co-evaluation                 AM-PAC OT "6 Clicks" Daily Activity     Outcome Measure   Help from another person eating meals?: A Little Help from another person taking care of personal grooming?: A Little Help from another person toileting, which includes using toliet, bedpan, or urinal?: Total Help from another person bathing (including washing, rinsing, drying)?: A Lot Help from another person to put on and taking off regular upper body clothing?: A Lot Help from another person to put on and taking off regular  lower body clothing?: Total 6 Click Score: 12    End of Session Equipment Utilized During Treatment: Rolling walker (2 wheels)  OT Visit Diagnosis: Unsteadiness on feet (R26.81);Other abnormalities of gait and mobility (R26.89);Muscle weakness (generalized) (M62.81);Other symptoms and signs involving cognitive function;Pain   Activity Tolerance Patient limited by fatigue   Patient Left in chair;with call bell/phone within reach;with nursing/sitter in room   Nurse Communication Mobility status        Time: 3500-9381 OT Time Calculation (min): 40 min  Charges: OT General Charges $OT Visit: 1 Visit OT Treatments $Self Care/Home Management : 8-22 mins $Therapeutic Activity: 23-37 mins  Lynnda Child, OTD, OTR/L Acute Rehab [phone] - [place] 03/12/2022, 12:26 PM

## 2022-03-12 NOTE — Progress Notes (Signed)
Physical Therapy Treatment Patient Details Name: Julie Montgomery MRN: [id number] DOB: [birth year] Today's Date: 03/12/2022   History of Present Illness Pt is an 86 year old woman who came to the ED on11/06 with distended abdomen, LE swelling, poor PO intake with dehydration. She was discharged 4 days prior with c-diff. Likely recurrent c-diff  and colonic ileus. PMH: CHF, dementia, PAF, chronic hyponatremia, anemia, TIA, osteopenia.    PT Comments    Pt up in recliner, expressing doubt that she will be able to ambulate with therapy but agrees that she will try. Pt found to have leakage around her rectal tube and gown and pads she is sitting on are soaked. Pt able to stand with min Ax2 for pericare prior to ambulation. With min A for steadying pt able to walk down the hallway impressing herself and her family. With return to room, discussed with pt need for air mattress and for her to be moving as much as she can even while sitting up in recliner to decrease her swelling and discomfort. Pt and family appreciative of information. D/c plans remain appropriate. PT will continue to follow acutely.    Recommendations for follow up therapy are one component of a multi-disciplinary discharge planning process, led by the attending physician.  Recommendations may be updated based on patient status, additional functional criteria and insurance authorization.  Follow Up Recommendations  Home health PT ([facility])     Assistance Recommended at Discharge Frequent or constant Supervision/Assistance (pt with multiple hospitalizations and may need more than spousal support)  Patient can return home with the following A little help with walking and/or transfers;A little help with bathing/dressing/bathroom;Assistance with cooking/housework;Direct supervision/assist for medications management;Direct supervision/assist for financial management;Assist for transportation;Help with stairs or ramp for  entrance;Assistance with feeding   Equipment Recommendations  None recommended by PT    Recommendations for Other Services       Precautions / Restrictions Precautions Precautions: Fall Precaution Comments: rectal pouch Restrictions Weight Bearing Restrictions: No     Mobility  Bed Mobility               General bed mobility comments: sitting up in recliner on entry    Transfers Overall transfer level: Needs assistance Equipment used: Rolling walker (2 wheels) Transfers: Sit to/from Stand, Bed to chair/wheelchair/BSC Sit to Stand: +2 physical assistance, Min assist           General transfer comment: min A for power up and steadying in standing    Ambulation/Gait Ambulation/Gait assistance: Min assist Gait Distance (Feet): 70 Feet Assistive device: Rolling walker (2 wheels) Gait Pattern/deviations: Step-through pattern, Decreased stride length, Narrow base of support Gait velocity: decreased Gait velocity interpretation: <1.8 ft/sec, indicate of risk for recurrent falls   General Gait Details: slowed, steady gait, vc for navigation around obstacles in hallway, pt and family impressed with how far pt able to ambulate today       Balance Overall balance assessment: Needs assistance   Sitting balance-Leahy Scale: Fair     Standing balance support: Bilateral upper extremity supported, No upper extremity supported, During functional activity Standing balance-Leahy Scale: Poor Standing balance comment: B UE support of RW and min assist                            Cognition Arousal/Alertness: Awake/alert Behavior During Therapy: Flat affect Overall Cognitive Status: No family/caregiver present to determine baseline cognitive functioning Area of Impairment: Memory,  Problem solving, Awareness, Following commands, Safety/judgement                       Following Commands: Follows one step commands with increased time     Problem  Solving: Slow processing, Difficulty sequencing, Requires verbal cues General Comments: much less flat and with better participation in this session           General Comments General comments (skin integrity, edema, etc.): VSS on RA, pt gown and pads soaked on entry, gown changed and pt cleaned up prior to ambulation      Pertinent Vitals/Pain Pain Assessment Pain Assessment: Faces Faces Pain Scale: Hurts little more Pain Location: abdomen Pain Descriptors / Indicators: Discomfort, Grimacing, Tightness, Sore Pain Intervention(s): Limited activity within patient's tolerance, Monitored during session, Repositioned     PT Goals (current goals can now be found in the care plan section) Acute Rehab PT Goals Patient Stated Goal: to go home PT Goal Formulation: With patient Time For Goal Achievement: 03/23/22 Potential to Achieve Goals: Fair Progress towards PT goals: Progressing toward goals    Frequency    Min 3X/week      PT Plan Current plan remains appropriate       AM-PAC PT "6 Clicks" Mobility   Outcome Measure  Help needed turning from your back to your side while in a flat bed without using bedrails?: A Lot Help needed moving from lying on your back to sitting on the side of a flat bed without using bedrails?: Total Help needed moving to and from a bed to a chair (including a wheelchair)?: Total Help needed standing up from a chair using your arms (e.g., wheelchair or bedside chair)?: Total Help needed to walk in hospital room?: Total Help needed climbing 3-5 steps with a railing? : Total 6 Click Score: 7    End of Session   Activity Tolerance: Patient tolerated treatment well Patient left: with call bell/phone within reach;in chair;with chair alarm set;with family/visitor present Nurse Communication: Mobility status PT Visit Diagnosis: Unsteadiness on feet (R26.81);Muscle weakness (generalized) (M62.81);Difficulty in walking, not elsewhere classified  (R26.2);Pain Pain - part of body:  (abdomen)     Time: 9628-3662 PT Time Calculation (min) (ACUTE ONLY): 40 min  Charges:  $Gait Training: 8-22 mins $Therapeutic Activity: 23-37 mins                     Oddis Westling B. Migdalia Dk PT, DPT Acute Rehabilitation Services Please use secure chat or  Call Office [phone]    [place] 03/12/2022, 4:08 PM

## 2022-03-12 NOTE — Progress Notes (Signed)
Nutrition Follow-up  DOCUMENTATION CODES:   Severe malnutrition in context of chronic illness, Underweight  INTERVENTION:  TPN management per pharmacy Continue Boost Breeze po TID, each supplement provides 250 kcal and 9 grams of protein 30 ml ProSource Plus TID, each supplement provides 100 kcals and 15 grams protein.   NUTRITION DIAGNOSIS:   Severe Malnutrition related to chronic illness (recurrent c. diff) as evidenced by severe fat depletion, severe muscle depletion.  Ongoing  GOAL:   Patient will meet greater than or equal to 90% of their needs  Goal met via TPN  MONITOR:   Diet advancement, Weight trends, Labs  REASON FOR ASSESSMENT:   Consult Other (Comment) (Assistance with nutritional recommendations for optimization in the setting of appearance of protein calorie malnutrition)  ASSESSMENT:   Pt admitted with dehydration d/t diminished oral intake, found to have ileus. PMH significant for dementia, recurrent C. Diff requiring multiple prior admissions, paroxysmal afib, chronic hyponatremia, anemia of chronic disease.  PICC line placed and TPN initiated on 11/11   TPN advancing to goal rate today: Goal rate 65 mL/hr at 1800 - will provide 100% of needs (1464 kcal and 70 g protein)   Pt sitting up in chair at time of visit, her husband present as well. She expresses concerns over her continued abdominal distension. She is still experiencing edema which is concerning and uncomfortable for her.   She has been consuming all Boost Breeze and Prosource that has been provided. Her husband states that she is doing well consuming clear liquid trays. She has been ordering jell-o, broth and hot tea.   Meal completions: 11/11: 75% breakfast, 50% lunch (clear liquid diet)  Pt now with rectal tube d/t ongoing frequent type 7 stools.   Edema: moderate pitting BUE, mild pitting LUE, moderate pitting BLE  Medications:SSI 0-9 units q4h, vancomycin  Labs: sodium 129, Cr  <0.30, AST 14, CBG's 135-223 x24 hours  Diet Order:   Diet Order             Diet clear liquid Room service appropriate? Yes; Fluid consistency: Thin  Diet effective now                   EDUCATION NEEDS:   Education needs have been addressed  Skin:  Skin Assessment: Skin Integrity Issues: Skin Integrity Issues:: DTI, Other (Comment) DTI: sacrum Other: non-pressure wound bilateral feet  Last BM:  11/12 via rectal tube  Height:   Ht Readings from Last 1 Encounters:  03/05/22 _0  (1.575 m)    Weight:   Wt Readings from Last 1 Encounters:  03/12/22 50.8 kg   BMI:  Body mass index is 20.48 kg/m.  Estimated Nutritional Needs:   Kcal:  1300-1500  Protein:  65-80g  Fluid:  1.3-1.5L  Clayborne Dana, RDN, LDN Clinical Nutrition

## 2022-03-12 NOTE — Telephone Encounter (Signed)
Left message for pt husband Alonna Bartling  to call back:

## 2022-03-12 NOTE — Progress Notes (Signed)
Daily Progress Note   Patient Name: Julie Montgomery       Date: 03/12/2022 DOB: 1935-10-21  Age: 86 y.o. MRN#: [id number] Attending Physician: [name], MD Primary Care Physician: Cassandria Anger, MD Admit Date: 03/05/2022  Reason for Consultation/Follow-up: Establishing goals of care  Subjective: Medical records reviewed including progress notes, labs, imaging. Patient assessed at the bedside.  She is sitting up in bedside chair, reports soreness in her legs and feet.  Assisted with repositioning and she reports some relief.  No family was present initially and we were then joined by patient's husband Julie Montgomery.   Created space and opportunity for patient's thoughts and feelings are current illness.  She tells me she does not have a good understanding of what has been tried thus far to treat her ongoing diarrhea.  She is also concerned by the amount of swelling in her legs and arms.  We discussed her care plan and relationship between her symptoms and severe malnutrition.  She understands the importance of improving her protein levels and restates her primary goal is to improve overall.  She tells me that she understands "everyone eventually reaches a point where they say, no more" but she is not there yet.    I spoke with patient's husband separately, who shares his concern about her condition and agrees he is very hopeful, constantly praying for improvement.  He tells me he understands the severity of her illness and that anything can happen at any time.  I explored his thoughts and feelings after making some decisions since our last discussion, such as TPN initiation, and he admits this has been quite difficult.  Emotional support and therapeutic listening was provided.  We discussed this  difficult timeframe of watchful waiting.  He wants to make every effort for patient to improve, and is also appreciative of ongoing palliative support and discussion of all possible outcomes.  Questions and concerns addressed. PMT will continue to support holistically.   Length of Stay: 6  Physical Exam Vitals and nursing note reviewed.  Constitutional:      General: She is not in acute distress.    Appearance: She is cachectic. She is ill-appearing.  Cardiovascular:     Rate and Rhythm: Normal rate.  Pulmonary:     Effort: Pulmonary effort is normal.  Abdominal:     General: There is distension.  Skin:    General: Skin is warm and dry.  Neurological:     Mental Status: She is alert.  Psychiatric:        Behavior: Behavior is cooperative.   Vital Signs: BP (!) 113/55 (BP Location: Left Arm)   Pulse 67   Temp 97.7 F (36.5 C) (Oral)   Resp 17   Ht '5\' 2"'$  (1.575 m)   Wt 50.8 kg   SpO2 96%   BMI 20.48 kg/m  SpO2: SpO2: 96 % O2 Device: O2 Device: Room Air O2 Flow Rate: O2 Flow Rate (L/min): 2 L/min      Palliative Assessment/Data: 40% at best   Palliative Care Assessment & Plan   Patient Profile: 86 y.o. female  with past medical history of  dementia, recurrent C. difficile, paroxysmal atrial fibrillation chronically anticoagulated on Eliquis, chronic hyponatremia, anemia of chronic disease associated baseline hemoglobin range 11-13, admitted on 03/05/2022 with poor oral intake and dehydration, diarrhea.    Patient has been hospitalized several times for recurrent C. difficile infections (9/14-9/16, 10/10-10/16, 10/26-11/1).  She is currently admitted again for AKI, hypercalcemia, ongoing management of C. difficile infection.  In the ED, CT of the abdomen and pelvis showed mildly progressive colonic distention with air-fluid levels and diminished bowel transit concerning for toxic megacolon, no signs of pneumatosis or bowel perforation.  Patient is a poor candidate for  endoscopic or surgical intervention.   PMT has been consulted to assist with goals of care conversation.  Assessment: Goals of care conversation Recurrent C. difficile Colonic ileus  Recommendations/Plan: Continue full code/full scope treatment Goals are clear for aggressive life-prolonging interventions Patient will benefit from outpatient palliative care referral when medically stable for discharge Psychosocial emotional support provided PMT will continue to follow and support incrementally. Please secure chat or call team line with urgent needs   Prognosis:  Unable to determine  Discharge Planning: To Be Determined  Care plan was discussed with patient, patient's husband   MDM high         Julie Mcclain Johnnette Litter, PA-C  Palliative Medicine Team Team phone # [phone]  Thank you for allowing the Palliative Medicine Team to assist in the care of this patient. Please utilize secure chat with additional questions, if there is no response within 30 minutes please call the above phone number.  Palliative Medicine Team providers are available by phone from 7am to 7pm daily and can be reached through the team cell phone.  Should this patient require assistance outside of these hours, please call the patient's attending physician.

## 2022-03-12 NOTE — Progress Notes (Signed)
Electrolytes replacement ongoing. Blood works to be done thereafter. Pharmacist informed.

## 2022-03-12 NOTE — Plan of Care (Signed)

## 2022-03-12 NOTE — Progress Notes (Signed)
[facility] PROGRESS NOTE  GIFT RUECKERT (DOB: Dec 28, 1935) WUJ:[id number] PCP: Cassandria Anger, MD  Brief Narrative: Julie Montgomery is an 86 y.o. female with a history of dementia, PAF, recurrent C diff, and chronic hyponatremia who presented 11/6 with decreased oral intake. Recent hospitalization from 10/25 to 02/28/22 for recurrent C diff colitis. She was discharge on oral vancomycin taper. At home her diarrhea was improving, taking imodium, though oral intake was very poor. She appeared dehydrated on exam with abdominal tenderness.    Na 129, K 4,3 CL 94 bicarbonate 23, glucose 104 bun 16 cr 0,49  Wbc 13,5 hgb 11,4 plt 356    Abdominal radiograph with marked dilatation of the ascending colon with interval worsening. Mild dilatation of the small bowel loops. Findings consistent with ileus.    CT abdomen and pelvis with persistent and mildly progressive colonic distention with air fluid levels, decreased caliber distal colon at the level of the mid sigmoid colon and increase caliber proximal small bowel loops with the left hemi abdomen. Fecalization of the prominent small bowel loops.    Chest radiograph with hyperinflation and small left pleural effusion, no infiltrates.   Subjective: Frustrated with feeling the exact same for several days now despite counseling about disease process. Her abdominal distention is the biggest concern for her, she also feels very weak with continued liquid stools. No fevers, doesn't currently feel worse.  Objective: BP (!) 113/55 (BP Location: Left Arm)   Pulse 67   Temp 97.7 F (36.5 C) (Oral)   Resp 17   Ht '5\' 2"'$  (1.575 m)   Wt 50.8 kg   SpO2 96%   BMI 20.48 kg/m   Gen: Thin, frail female in no distress sitting in chair Pulm: Clear, nonlabored  CV: RRR, borderline bradycardia, no MRG. Diffuse pitting edema.  GI: Soft, slightly distended with diffuse mod-intense tenderness without rebound or guarding. Loud bowel sounds. Neuro: Alert  and oriented. No new focal deficits. Ext: Warm, no deformities Skin: No rashes, lesions or ulcers on visualized skin. PICC site c/d/i  Assessment & Plan: Ileus: Favored Dx in setting of imodium over toxic megacolon at this time with relatively modest leukocytosis (normalized), nontoxic appearance and exam.  - Leukocytosis normalized, remains afebrile. Pain still significant, pt not feeling progress is being made. XR consistent with improving exam/distention, but still very profuse diarrhea.  - Continue diet, TPN.  - Surgery signed off.    Diarrhea: Remains profuse requiring tube.  - Will continue management as we are. Will reach out nonurgently to GI for any additional opinions on work up or treatment.  ETEC:  - s/p azithromycin x1 11/10. Supportive care for this for now. No hematochezia.  Recurrent Clostridioides difficile infection: Repeat work up was negative here, suspect colonization.  - Continue PO vancomycin down to BID for tail coverage per ID (final dose 11/15 pm), DC'ed flagyl per ID. Was reportedly considering Vowst.   - Patient may benefit from outpatient [place] at tertiary care center after discharge. - Contact isolation  Acute urinary retention, pyuria: No UCx growth. Passed TOV 11/11.   Dehydration, hypovolemic hyponatremia:  - DC IVF with more edema (hypoalbuminemia and immobility also contributing) and TPN   Hypokalemia:  - Supplementing in TPN.    Hypomagnesemia:  - Repeat supplementation today  Hypophosphatemia:  - Supplement again today, continue intensive monitoring.  PAF: Has remained in sinus rhythm since admission.  - IV metoprolol prn. - Given her risk factors and hx TIA, restarted anticoagulation. Unclear  whether GI may pursue flex sig, etc. so keeping on parenteral anticoagulation for now. Will reach back out to them as above. - Once taking po reliably, restart home diltiazem, eliquis  Dementia: Appears mild. - Continue namenda.  - Husband, son at  bedside   Anemia of chronic disease: - No evidence of acute bleeding, monitor intermittently.   Severe protein calorie malnutrition:  - Continue TPN, started 11/11 thru PICC placed 11/11.   Third spacing, hypoalbuminemia:  - Will give lasix today.  Vitamin D deficiency:  - Supplement 50k units weekly x8 weeks started 11/8 and recheck  [name], MD [facility] [email or web address] 03/12/2022, 3:55 PM

## 2022-03-12 NOTE — Progress Notes (Signed)
PHARMACY - TOTAL PARENTERAL NUTRITION CONSULT NOTE   Indication:  prolonged hospitalization with severe malnutrition/anasarca  Patient Measurements: Height: '5\' 2"'$  (157.5 cm) Weight: 50.8 kg (111 lb 15.9 oz) IBW/kg (Calculated) : 50.1 TPN AdjBW (KG): 41.3 Body mass index is 20.48 kg/m. Usual Weight: 49.5 kg  Assessment:  [name] with malnutrition d/t multiple factors including dementia, recurrent C diff with multiple admissions and GI intolerance, and difficulty chewing with ill-fitting dentures. Pt had poor PO intake prior to admit and reports nut allergy and lactose intolerance. Pharmacy consulted for TPN in setting of malnutrition and inability to meet needs with PO intake.    Glucose / Insulin: no A1c in chart - CBG 135-159, so SSI ordered Electrolytes: Na 129, K 3.8, Mag 1.8, Phos 3.3,  CoCa 9.7, Cl 96 Renal: Scr <0.30, BUN 9  Hepatic: LFTs WNL. Trigs 70 Intake / Output; MIVF: UOP 0.7 ml/kg/hr; rectal tube in place for Cdiff 266m charted  GI Imaging: 11/6 CTAP: persistent colonic distention , cannot exclude toxic megacolon 11/9 Ab Xray: decrease in gas distention, improved ileus  11/10 Ab Xray: improvement in gaseous distention  GI Surgeries / Procedures:   Central access: PICC placed 03/10/22 TPN start date: 03/10/2022  Nutritional Goals: Goal TPN rate is 65 mL/hr (provides 70 g of protein and 1464 kcals per day)  RD Assessment: Estimated Needs Total Energy Estimated Needs: 1300-1500 Total Protein Estimated Needs: 65-80g Total Fluid Estimated Needs: 1.3-1.5L  Current Nutrition:  Clear liquids 11/9>  Boost TID - 1x dose charted Prosource TID - 3x doses charted  Plan:  Increase TPN to goal rate 65 mL/hr at 1800 - will provide 100% of needs (1464 kcal and 70 g protein) -patient has reported nut allergy (peanuts) - low risk of cross reactivity with SMOFLipid ingredient. Upon asking for rxn/details, pt and pt spouse do not remember pt having a nut allergy. Regardless,  RN aware and will monitor for any cross-reaction with lipids.    Electrolytes in TPN: Na 678m/L, K 6554mL, Ca 5mE1m, Mg 10 mEq/L, and Phos 30mm21m (max). Cl:Ac 2:1 Add standard MVI and trace elements to TPN Initiate Sensitive q4h SSI and adjust as needed  Monitor TPN labs on Mon/Thurs, and daily until stable 2000 BMET to monitor electrolytes   LorinErskine SpeedrmD Clinical Pharmacist 03/12/2022 7:23 AM

## 2022-03-12 NOTE — Telephone Encounter (Signed)
Patients's husband called has worries about his wife's upcoming appointment on 11/16 due to the fact that she is still in the hospital. Wondering if anyone will be able to be there to do the appointment. Please advise

## 2022-03-13 DIAGNOSIS — K567 Ileus, unspecified: Secondary | ICD-10-CM | POA: Diagnosis not present

## 2022-03-13 DIAGNOSIS — I480 Paroxysmal atrial fibrillation: Secondary | ICD-10-CM | POA: Diagnosis not present

## 2022-03-13 DIAGNOSIS — E860 Dehydration: Secondary | ICD-10-CM | POA: Diagnosis not present

## 2022-03-13 DIAGNOSIS — A498 Other bacterial infections of unspecified site: Secondary | ICD-10-CM | POA: Diagnosis not present

## 2022-03-13 DIAGNOSIS — E43 Unspecified severe protein-calorie malnutrition: Secondary | ICD-10-CM | POA: Diagnosis not present

## 2022-03-13 DIAGNOSIS — A0472 Enterocolitis due to Clostridium difficile, not specified as recurrent: Secondary | ICD-10-CM | POA: Diagnosis not present

## 2022-03-13 LAB — BASIC METABOLIC PANEL
Anion gap: 4 — ABNORMAL LOW (ref 5–15)
BUN: 11 mg/dL (ref 8–23)
CO2: 29 mmol/L (ref 22–32)
Calcium: 8.1 mg/dL — ABNORMAL LOW (ref 8.9–10.3)
Chloride: 98 mmol/L (ref 98–111)
Creatinine, Ser: <0.3 mg/dL — ABNORMAL LOW (ref 0.44–1.00)
Glucose, Bld: 109 mg/dL — ABNORMAL HIGH (ref 70–99)
Potassium: 3.8 mmol/L (ref 3.5–5.1)
Sodium: 131 mmol/L — ABNORMAL LOW (ref 135–145)

## 2022-03-13 LAB — CBC WITH DIFFERENTIAL/PLATELET
Abs Immature Granulocytes: 0.12 10*3/uL — ABNORMAL HIGH (ref 0.00–0.07)
Basophils Absolute: 0 10*3/uL (ref 0.0–0.1)
Basophils Relative: 0 %
Eosinophils Absolute: 0.1 10*3/uL (ref 0.0–0.5)
Eosinophils Relative: 1 %
HCT: 29.3 % — ABNORMAL LOW (ref 36.0–46.0)
Hemoglobin: 9.9 g/dL — ABNORMAL LOW (ref 12.0–15.0)
Immature Granulocytes: 1 %
Lymphocytes Relative: 13 %
Lymphs Abs: 1.2 10*3/uL (ref 0.7–4.0)
MCH: 30.4 pg (ref 26.0–34.0)
MCHC: 33.8 g/dL (ref 30.0–36.0)
MCV: 89.9 fL (ref 80.0–100.0)
Monocytes Absolute: 1.4 10*3/uL — ABNORMAL HIGH (ref 0.1–1.0)
Monocytes Relative: 15 %
Neutro Abs: 6.2 10*3/uL (ref 1.7–7.7)
Neutrophils Relative %: 70 %
Platelets: 206 10*3/uL (ref 150–400)
RBC: 3.26 MIL/uL — ABNORMAL LOW (ref 3.87–5.11)
RDW: 15.7 % — ABNORMAL HIGH (ref 11.5–15.5)
WBC: 9 10*3/uL (ref 4.0–10.5)
nRBC: 0 % (ref 0.0–0.2)

## 2022-03-13 LAB — GLUCOSE, CAPILLARY
Glucose-Capillary: 120 mg/dL — ABNORMAL HIGH (ref 70–99)
Glucose-Capillary: 127 mg/dL — ABNORMAL HIGH (ref 70–99)
Glucose-Capillary: 129 mg/dL — ABNORMAL HIGH (ref 70–99)
Glucose-Capillary: 133 mg/dL — ABNORMAL HIGH (ref 70–99)
Glucose-Capillary: 161 mg/dL — ABNORMAL HIGH (ref 70–99)
Glucose-Capillary: 169 mg/dL — ABNORMAL HIGH (ref 70–99)

## 2022-03-13 LAB — PHOSPHORUS: Phosphorus: 3.1 mg/dL (ref 2.5–4.6)

## 2022-03-13 LAB — MAGNESIUM: Magnesium: 2.1 mg/dL (ref 1.7–2.4)

## 2022-03-13 MED ORDER — TRAVASOL 10 % IV SOLN
INTRAVENOUS | Status: AC
  Filled 2022-03-13: qty 702

## 2022-03-13 MED ORDER — FUROSEMIDE 10 MG/ML IJ SOLN
40.0000 mg | Freq: Once | INTRAMUSCULAR | Status: AC
  Administered 2022-03-13: 40 mg via INTRAVENOUS
  Filled 2022-03-13: qty 4

## 2022-03-13 NOTE — Plan of Care (Signed)

## 2022-03-13 NOTE — Progress Notes (Signed)
This chaplain attempted a F/U spiritual care visit. The Pt. politely requested a visit later. The chaplain accepted the Pt. family just arrived.   Chaplain Sallyanne Kuster [phone]

## 2022-03-13 NOTE — Progress Notes (Signed)
[facility] PROGRESS NOTE  Julie Montgomery (DOB: November 15, 1935) KKX:[id number] PCP: Cassandria Anger, MD  Brief Narrative: Julie Montgomery is an 86 y.o. female with a history of dementia, PAF, recurrent C diff, and chronic hyponatremia who presented 11/6 with decreased oral intake. Recent hospitalization from 10/25 to 02/28/22 for recurrent C diff colitis. She was discharge on oral vancomycin taper. At home her diarrhea was improving, taking imodium, though oral intake was very poor. She appeared dehydrated on exam with abdominal tenderness.    Na 129, K 4,3 CL 94 bicarbonate 23, glucose 104 bun 16 cr 0,49  Wbc 13,5 hgb 11,4 plt 356    Abdominal radiograph with marked dilatation of the ascending colon with interval worsening. Mild dilatation of the small bowel loops. Findings consistent with ileus.    CT abdomen and pelvis with persistent and mildly progressive colonic distention with air fluid levels, decreased caliber distal colon at the level of the mid sigmoid colon and increase caliber proximal small bowel loops with the left hemi abdomen. Fecalization of the prominent small bowel loops.    Chest radiograph with hyperinflation and small left pleural effusion, no infiltrates.   She was admitted and treated for recurrent C. diff though work up more suggestive of colonization. ID recommended oral vancomycin with taper. ETEC + in stool, given a dose of azithromycin. General surgery initially consulted, signed off. GI consulted, recommended TPN, and ultimately signed off having no further recommendations. The patient's abdominal distention has improved, stool output decreased, though she continues to have significant abdominal pain and require rectal tube. Per family request, GI is asked for any additional recommendations they may have.  Subjective: Having stress that is unrelated to medical condition. Left it at that. Still doesn't really want much to eat, but amenable to trying more  progressed diet. Had terrible abdominal discomfort last night that has abated. Having less output from rectal tube.   Objective: BP 134/68   Pulse 78   Temp 97.7 F (36.5 C) (Oral)   Resp 20   Ht '5\' 2"'$  (1.575 m)   Wt 51.9 kg   SpO2 96%   BMI 20.93 kg/m   Gen: Frail, elderly, thin female in no distress Pulm: Nonlabored breathing room air. Clear. CV: Regular rate and rhythm. No murmur, rub, or gallop. No JVD, stable diffuse edema. GI: Abdomen soft and less distended, still diffusely tender without rigidity or rebound. Normoactive bowel sounds today. ~1L dark brown liquid stool in rectal containment system. Ext: Warm, no deformities Skin: No new rashes, lesions or ulcers on visualized skin. PICC c/d/i Neuro: Alert and oriented. No focal neurological deficits.  Assessment & Plan: Ileus: Favored Dx in setting of imodium over toxic megacolon at this time with relatively modest leukocytosis (normalized), nontoxic appearance and exam.  - Overall clearly improving: Leukocytosis normalized, remains afebrile. XR consistent with improving exam/distention. Stool output declining, though still very liquid.  - Pain is still significant. Continue analgesics. - Continue diet, can advance, continue TPN.  - Surgery signed off.    Diarrhea: Remains profuse requiring tube.  - Will continue management as we are. I do appreciate GI reevaluating to see if any further work up is currently indicated and discuss with family.   ETEC:  - s/p azithromycin x1 11/10. Supportive care for this for now. No hematochezia.  Recurrent Clostridioides difficile infection: Repeat work up was negative here, suspect colonization.  - Continue PO vancomycin down to BID for tail coverage per ID (final dose 11/15 pm),  DC'ed flagyl per ID.    - Patient may benefit from outpatient FMT at tertiary care center after discharge.  ID submitted paperwork for approval for Vowst in conjunction with Dr. Hilarie Fredrickson of GI. No response on  authorization at this time. This is an outpatient medication.  - Contact isolation  Acute urinary retention, pyuria: No UCx growth. Passed TOV 11/11.   Severe protein calorie malnutrition:  - Continue TPN, started 11/11 thru PICC placed 11/11.   Third spacing, hypoalbuminemia:  - Robust response to lasix 11/13 (>4L UOP, creatinine went down) remains peripherally overloaded. BP is stable. Will repeat lasix today  Dehydration: Resolved.  - DC IVF with more edema (hypoalbuminemia and immobility also contributing) and TPN   Hypokalemia:  - Supplementing in TPN.    Hypomagnesemia:  - Repeat supplementation today  Hypophosphatemia:  - Supplement again today, continue intensive monitoring.  PAF: Has remained in sinus rhythm since admission.  - IV metoprolol prn. - Given her risk factors and hx TIA, restarted anticoagulation. Unclear whether GI may pursue flex sig, etc. so keeping on parenteral anticoagulation for now. Will reach back out to them as above. - Once taking po reliably, restart home diltiazem, eliquis  Dementia: Appears mild. - Continue namenda.  - Family has been at bedside consistently.   Anemia of chronic disease: - No evidence of acute bleeding, monitor intermittently.   Vitamin D deficiency:  - Supplement 50k units weekly x8 weeks started 11/8 and recheck  [name], MD [facility] [email or web address] 03/13/2022, 3:48 PM

## 2022-03-13 NOTE — Progress Notes (Signed)
PHARMACY - TOTAL PARENTERAL NUTRITION CONSULT NOTE   Indication:  prolonged hospitalization with severe malnutrition/anasarca  Patient Measurements: Height: '5\' 2"'$  (157.5 cm) Weight: 51.9 kg (114 lb 6.7 oz) IBW/kg (Calculated) : 50.1 TPN AdjBW (KG): 41.3 Body mass index is 20.93 kg/m. Usual Weight: 49.5 kg  Assessment:  [name] with malnutrition d/t multiple factors including dementia, recurrent C diff with multiple admissions and GI intolerance, and difficulty chewing with ill-fitting dentures. Pt had poor PO intake prior to admit and reports nut allergy and lactose intolerance. Pharmacy consulted for TPN in setting of malnutrition and inability to meet needs with PO intake.    Glucose / Insulin: A1c 5.4 - CBG 103-223, used 3u/SSI in 24 hours Electrolytes: Na 131, K 3.8, Mag 2.1, Phos 3.1,  CoCa 10, Cl 98 Renal: Scr <0.30, BUN 11 Hepatic: LFTs WNL. Trigs 70 Intake / Output; MIVF: UOP 3.8 ml/kg/hr; rectal tube in place for Cdiff 737m charted  GI Imaging: 11/6 CTAP: persistent colonic distention , cannot exclude toxic megacolon 11/9 Ab Xray: decrease in gas distention, improved ileus  11/10 Ab Xray: improvement in gaseous distention 11/12 Ab Xray: no bowel dilation to suggest obstruction/ileus  GI Surgeries / Procedures:   Central access: PICC placed 03/10/22 TPN start date: 03/10/2022  Nutritional Goals: Goal TPN rate is 65 mL/hr (provides 70 g of protein and 1464 kcals per day)  RD Assessment: Estimated Needs Total Energy Estimated Needs: 1300-1500 Total Protein Estimated Needs: 65-80g Total Fluid Estimated Needs: 1.3-1.5L  Current Nutrition:  Clear liquids 11/9>  Boost TID - 1x dose charted Prosource TID - 3x doses charted  Plan:  Continue TPN at goal rate 65 mL/hr at 1800 - will provide 100% of needs (1464 kcal and 70 g protein) -patient has reported nut allergy (peanuts) - low risk of cross reactivity with SMOFLipid ingredient. Upon asking for rxn/details, pt and  pt spouse do not remember pt having a nut allergy. Regardless, RN aware and will monitor for any cross-reaction with lipids.    Electrolytes in TPN: Incr Na to 723m/L, K 6557mL, Ca 5mE47m, Mg 10 mEq/L, and Decr Phos to 25 mmol/L. Cl:Ac 2:1 Add standard MVI and trace elements to TPN Initiate Sensitive q4h SSI and adjust as needed  Monitor TPN labs on Mon/Thurs, and daily until stable  LoriErskine SpeedarmD Clinical Pharmacist 03/13/2022 7:32 AM

## 2022-03-13 NOTE — Progress Notes (Addendum)
Daily Rounding Note  03/13/2022, 11:22 AM  LOS: 7 days   SUBJECTIVE:   Chief complaint: [name] family requesting GI to follow up infectious diarrhea.  See previous GI notes 11/7-11/10.  Hx recurrent C. Diff, dating back to mid August 2023.   Admission 10/25 - 11/1 and discharged on long vanco taper, rec for [facility] referral for FMT.  Seen by ID.  01/10/2022 antigen positive, toxin negative, PCR positive toxigenic C. Difficile 10/7 started oral vanc, IV Flagyl.   10/12 negative antigen and toxin.  10/26 positive antigen.  Negative toxin.  Positive toxigenic PCR 11/8 ETEC detected on stool path panel.  11/10 negative antigen, negative toxin.  No PCR.    Treated with oral vancomycin and IV Flagyl.  Concern for toxic megacolon on CT but this is felt to be colonic ileus.   ETEC detected on pathogen study by ID recommended no treatment for this.  However she received a single dose of azithromycin on 11/10. Last noght had abdominal pain, now resolved. Today's RN who cared for her last week relays that stool frequency and volume are diminished.  Stool texture is less watery, though not yet formed, more pasty texture. ID involved thru 11/10.  They felt pt was colonized w C diff.  She had completed 14 d of qod dosing. Transitioned her to Vanco bid x 5 days, then stop.  Rec to consider colonoscopy to eval for other sources diarrhea.  Last colonoscopy was 03/2019.   In late October, Dr. Lyndel Safe had suggested possible treatment with vowst Treatment w Zinplava mentioned but do not see actual documentation this was ever taken/given.    Scheduled Meds:  (feeding supplement) PROSource Plus  30 mL Oral TID BM   Chlorhexidine Gluconate Cloth  6 each Topical Daily   diltiazem  180 mg Oral Daily   enoxaparin (LOVENOX) injection  50 mg Subcutaneous Q12H   feeding supplement  1 Container Oral TID BM   insulin aspart  0-9 Units Subcutaneous Q4H    memantine  5 mg Oral BID   sodium chloride flush  10-40 mL Intracatheter Q12H   vancomycin  125 mg Oral BID   Vitamin D (Ergocalciferol)  50,000 Units Oral Q7 days   Continuous Infusions:  TPN ADULT (ION) 65 mL/hr at 03/13/22 0841   TPN ADULT (ION)     PRN Meds:.acetaminophen **OR** acetaminophen, fentaNYL (SUBLIMAZE) injection, metoprolol tartrate, naLOXone (NARCAN)  injection, ondansetron (ZOFRAN) IV, sodium chloride flush   OBJECTIVE:         Vital signs in last 24 hours:    Temp:  [97.7 F (36.5 C)-97.8 F (36.6 C)] 97.7 F (36.5 C) (11/14 0419) Pulse Rate:  [78-90] 78 (11/14 0419) Resp:  [20] 20 (11/14 0419) BP: (114-134)/(61-68) 134/68 (11/14 0838) SpO2:  [96 %-97 %] 96 % (11/14 0419) Weight:  [51.9 kg] 51.9 kg (11/14 0419) Last BM Date : 03/12/22 Filed Weights   03/11/22 0358 03/12/22 0432 03/13/22 0419  Weight: 50.8 kg 50.8 kg 51.9 kg   General: Severely cachectic, malnourished comfortable in no distress.Marland Kitchen Heart: RRR.  No MRG. Chest: Some dry crackles in the left base.  Slight dyspnea with speech.  No active cough. Abdomen: Distended but soft and nontender.  Bowel sounds hypoactive no increased tympany or tinkling bowel sounds. Extremities: Anasarca with pitting swelling in legs, abdomen and nonpitting swelling upper extremities, left > right Rectal: Rectal pouch in place.  Contains normal quantity brown soft to liquid, nonbloody  stool.  Towels underneath patient for saturated possibly with urine.  External urine collection system in position. Neuro/Psych: Calm, cooperative, appropriate.  Moves all 4 limbs, strength not tested.  Intake/Output from previous day: 11/13 0701 - 11/14 0700 In: 150 [P.O.:150] Out: 5500 [Urine:4750; Stool:750]  Intake/Output this shift: No intake/output data recorded.  Lab Results: Recent Labs    03/11/22 0615 03/12/22 0514 03/13/22 0521  WBC 10.1 8.3 9.0  HGB 10.6* 10.1* 9.9*  HCT 31.3* 30.7* 29.3*  PLT 267 235 206    BMET Recent Labs    03/12/22 0514 03/12/22 1532 03/13/22 0521  NA 129* 129* 131*  K 3.8 3.7 3.8  CL 96* 94* 98  CO2 '26 27 29  '$ GLUCOSE 138* 149* 109*  BUN '9 9 11  '$ CREATININE <0.30* 0.32* <0.30*  CALCIUM 7.8* 8.0* 8.1*   LFT Recent Labs    03/12/22 0514  PROT 4.2*  ALBUMIN 1.6*  AST 14*  ALT 13  ALKPHOS 77  BILITOT 0.1*   PT/INR No results for input(s): "LABPROT", "INR" in the last 72 hours. Hepatitis Panel No results for input(s): "HEPBSAG", "HCVAB", "HEPAIGM", "HEPBIGM" in the last 72 hours.  Studies/Results: No results found.  ASSESMENT:     Multiple bouts of c diff associated diarrhea.  Has completed long taper of vancomycin, previous dificid.  Stool frequency, consistency improving but still bothersome diarrhea.  As of a few days ago, ID felt she was C. difficile colonized and did not recommend further antibiotics upon   completing final 5 days oral vanc on 11/15.  ETEC on stool PCR, ID said no treatment but she did receive single dose azithromycin on 11/10.  Severe malnutrition, on TNA.  Not sure why she is only being allowed clear liquids   PLAN     Advance to soft diet.      RE Vowst, this is only available as an outpatient therapy.  Not sure if anybody is looking to whether her insurance will cover this.   Azucena Freed  03/13/2022, 11:22 AM Phone [phone]

## 2022-03-13 NOTE — Care Management Important Message (Signed)
Important Message  Patient Details  Name: Julie Montgomery MRN: [id number] Date of Birth: Dec 16, 1935   Medicare Important Message Given:  Yes     [name] 03/13/2022, 11:04 AM

## 2022-03-13 NOTE — Telephone Encounter (Signed)
Left message for pt husband Tima Curet  to call back:

## 2022-03-14 DIAGNOSIS — K529 Noninfective gastroenteritis and colitis, unspecified: Secondary | ICD-10-CM

## 2022-03-14 DIAGNOSIS — E43 Unspecified severe protein-calorie malnutrition: Secondary | ICD-10-CM | POA: Diagnosis not present

## 2022-03-14 DIAGNOSIS — A0472 Enterocolitis due to Clostridium difficile, not specified as recurrent: Secondary | ICD-10-CM | POA: Diagnosis not present

## 2022-03-14 DIAGNOSIS — K567 Ileus, unspecified: Secondary | ICD-10-CM | POA: Diagnosis not present

## 2022-03-14 LAB — BASIC METABOLIC PANEL
Anion gap: 6 (ref 5–15)
BUN: 17 mg/dL (ref 8–23)
CO2: 29 mmol/L (ref 22–32)
Calcium: 8.1 mg/dL — ABNORMAL LOW (ref 8.9–10.3)
Chloride: 94 mmol/L — ABNORMAL LOW (ref 98–111)
Creatinine, Ser: <0.3 mg/dL — ABNORMAL LOW (ref 0.44–1.00)
Glucose, Bld: 137 mg/dL — ABNORMAL HIGH (ref 70–99)
Potassium: 4.3 mmol/L (ref 3.5–5.1)
Sodium: 129 mmol/L — ABNORMAL LOW (ref 135–145)

## 2022-03-14 LAB — CBC WITH DIFFERENTIAL/PLATELET
Abs Immature Granulocytes: 0.12 10*3/uL — ABNORMAL HIGH (ref 0.00–0.07)
Basophils Absolute: 0 10*3/uL (ref 0.0–0.1)
Basophils Relative: 0 %
Eosinophils Absolute: 0.1 10*3/uL (ref 0.0–0.5)
Eosinophils Relative: 1 %
HCT: 28.8 % — ABNORMAL LOW (ref 36.0–46.0)
Hemoglobin: 9.4 g/dL — ABNORMAL LOW (ref 12.0–15.0)
Immature Granulocytes: 1 %
Lymphocytes Relative: 9 %
Lymphs Abs: 1 10*3/uL (ref 0.7–4.0)
MCH: 30.1 pg (ref 26.0–34.0)
MCHC: 32.6 g/dL (ref 30.0–36.0)
MCV: 92.3 fL (ref 80.0–100.0)
Monocytes Absolute: 1.2 10*3/uL — ABNORMAL HIGH (ref 0.1–1.0)
Monocytes Relative: 11 %
Neutro Abs: 8.5 10*3/uL — ABNORMAL HIGH (ref 1.7–7.7)
Neutrophils Relative %: 78 %
Platelets: 200 10*3/uL (ref 150–400)
RBC: 3.12 MIL/uL — ABNORMAL LOW (ref 3.87–5.11)
RDW: 15.8 % — ABNORMAL HIGH (ref 11.5–15.5)
WBC: 11 10*3/uL — ABNORMAL HIGH (ref 4.0–10.5)
nRBC: 0 % (ref 0.0–0.2)

## 2022-03-14 LAB — GLUCOSE, CAPILLARY
Glucose-Capillary: 117 mg/dL — ABNORMAL HIGH (ref 70–99)
Glucose-Capillary: 142 mg/dL — ABNORMAL HIGH (ref 70–99)
Glucose-Capillary: 142 mg/dL — ABNORMAL HIGH (ref 70–99)
Glucose-Capillary: 144 mg/dL — ABNORMAL HIGH (ref 70–99)
Glucose-Capillary: 163 mg/dL — ABNORMAL HIGH (ref 70–99)
Glucose-Capillary: 80 mg/dL (ref 70–99)

## 2022-03-14 LAB — PHOSPHORUS: Phosphorus: 2.9 mg/dL (ref 2.5–4.6)

## 2022-03-14 LAB — MAGNESIUM: Magnesium: 1.9 mg/dL (ref 1.7–2.4)

## 2022-03-14 MED ORDER — FUROSEMIDE 10 MG/ML IJ SOLN
40.0000 mg | Freq: Every day | INTRAMUSCULAR | Status: DC
  Administered 2022-03-15 – 2022-03-16 (×2): 40 mg via INTRAVENOUS
  Filled 2022-03-14 (×2): qty 4

## 2022-03-14 MED ORDER — LIDOCAINE 5 % EX PTCH
1.0000 | MEDICATED_PATCH | CUTANEOUS | Status: DC
  Administered 2022-03-14 – 2022-03-29 (×14): 1 via TRANSDERMAL
  Filled 2022-03-14 (×16): qty 1

## 2022-03-14 MED ORDER — INSULIN ASPART 100 UNIT/ML IJ SOLN
0.0000 [IU] | Freq: Four times a day (QID) | INTRAMUSCULAR | Status: DC
  Administered 2022-03-14 – 2022-03-15 (×2): 2 [IU] via SUBCUTANEOUS
  Administered 2022-03-16: 1 [IU] via SUBCUTANEOUS
  Administered 2022-03-16: 2 [IU] via SUBCUTANEOUS
  Administered 2022-03-16 (×2): 1 [IU] via SUBCUTANEOUS
  Administered 2022-03-17: 3 [IU] via SUBCUTANEOUS
  Administered 2022-03-17: 2 [IU] via SUBCUTANEOUS

## 2022-03-14 MED ORDER — ALBUMIN HUMAN 25 % IV SOLN
25.0000 g | Freq: Two times a day (BID) | INTRAVENOUS | Status: DC
  Administered 2022-03-14 – 2022-03-16 (×4): 25 g via INTRAVENOUS
  Filled 2022-03-14 (×4): qty 100

## 2022-03-14 MED ORDER — TRAVASOL 10 % IV SOLN
INTRAVENOUS | Status: AC
  Filled 2022-03-14: qty 702

## 2022-03-14 NOTE — Progress Notes (Signed)
   Chart reviewed including progress notes, labs, imaging.  Noted ongoing discussions of Vowst and ongoing process of obtaining outpatient approval.  Patient continues to tolerate diet advancement and still requires TPN.  Goal remains improvement and continuing aggressive life-prolonging interventions.  PMT will continue to follow along peripherally and engage incrementally for palliative support and ongoing goals of care discussions. Will see patient on 11/16 or 11/18.  Please secure chat or call team line with urgent needs.  Thank you for your referral and allowing PMT to assist in Julie Montgomery care.   Dorthy Cooler, [facility] Palliative Medicine Team  Team Phone # [phone]   NO CHARGE

## 2022-03-14 NOTE — Consult Note (Signed)
   [facility] CM Inpatient Consult   03/14/2022  ANGENETTE DAILY 1935/10/20 [id number]  [facility] [ACO] Patient: Medicare ACO REACH   Primary Care Provider:  Cassandria Anger, MD, [place] [facility] is listed to provide the transition of care follow up   Patient screened for less than 7 days hospitalization with noted extreme high risk score for unplanned readmission risk to assess for potential [place] Management service needs for post hospital transition for care coordination.  Plan:  Continue to follow progress and disposition to assess for post hospital community care coordination/management needs.  Referral request for community care coordination: follow up for needs.  Of note, [facility] Care Management/Population Health does not replace or interfere with any arrangements made by the Inpatient Transition of Care team.  For questions contact:   Natividad Brood, RN BSN [place]  [phone] business mobile phone Toll free office [phone]  *[place]  [phone] Fax number: [phone] [email or web address]

## 2022-03-14 NOTE — Plan of Care (Signed)

## 2022-03-14 NOTE — Progress Notes (Addendum)
Daily Rounding Note  03/14/2022, 10:36 AM  LOS: 8 days   SUBJECTIVE:   Chief complaint:    Diarrhea, recurrences of C. difficile associated diarrhea  Complains of weakness.  Tolerating soft diet but eating minimal amounts.  Diet is not causing any increase in abdominal pain or nausea, vomiting.  She endorses abdominal discomfort during exam. RN reports that the stools are more watery today. Recorded stool output of 750 mL yesterday, 150 mL so far today PT working with her but she remains very weak.    OBJECTIVE:         Vital signs in last 24 hours:    Temp:  [97.7 F (36.5 C)-98.1 F (36.7 C)] 97.7 F (36.5 C) (11/15 0428) Pulse Rate:  [86-94] 86 (11/15 0428) Resp:  [15-18] 16 (11/15 0428) BP: (119-138)/(61-74) 127/66 (11/15 0428) SpO2:  [95 %-99 %] 99 % (11/15 0428) Weight:  [52.2 kg] 52.2 kg (11/15 0500) Last BM Date : 03/13/22 Filed Weights   03/12/22 0432 03/13/22 0419 03/14/22 0500  Weight: 50.8 kg 51.9 kg 52.2 kg   General: Cachectic, chronically ill looking.  Frail. Heart: RRR Chest: Some dyspnea with speaking. Abdomen: Soft.  Diffuse, nonfocal minor tenderness.  Bowel sounds active.  Watery medium brown effluent in pouch collection bag.  There are about 220 cc Extremities: Anasarca. Neuro/Psych: Oriented.  Appropriate.  Not confused  Intake/Output from previous day: 11/14 0701 - 11/15 0700 In: -  Out: 150 [Stool:150]  Intake/Output this shift: No intake/output data recorded.  Lab Results: Recent Labs    03/12/22 0514 03/13/22 0521 03/14/22 0500  WBC 8.3 9.0 11.0*  HGB 10.1* 9.9* 9.4*  HCT 30.7* 29.3* 28.8*  PLT 235 206 200   BMET Recent Labs    03/12/22 1532 03/13/22 0521 03/14/22 0500  NA 129* 131* 129*  K 3.7 3.8 4.3  CL 94* 98 94*  CO2 '27 29 29  '$ GLUCOSE 149* 109* 137*  BUN '9 11 17  '$ CREATININE 0.32* <0.30* <0.30*  CALCIUM 8.0* 8.1* 8.1*   LFT Recent Labs    03/12/22 0514   PROT 4.2*  ALBUMIN 1.6*  AST 14*  ALT 13  ALKPHOS 77  BILITOT 0.1*   PT/INR No results for input(s): "LABPROT", "INR" in the last 72 hours. Hepatitis Panel No results for input(s): "HEPBSAG", "HCVAB", "HEPAIGM", "HEPBIGM" in the last 72 hours.  Studies/Results: No results found.  Scheduled Meds:  (feeding supplement) PROSource Plus  30 mL Oral TID BM   Chlorhexidine Gluconate Cloth  6 each Topical Daily   diltiazem  180 mg Oral Daily   enoxaparin (LOVENOX) injection  50 mg Subcutaneous Q12H   feeding supplement  1 Container Oral TID BM   insulin aspart  0-9 Units Subcutaneous Q4H   memantine  5 mg Oral BID   sodium chloride flush  10-40 mL Intracatheter Q12H   vancomycin  125 mg Oral BID   Vitamin D (Ergocalciferol)  50,000 Units Oral Q7 days   Continuous Infusions:  TPN ADULT (ION) 65 mL/hr at 03/13/22 1830   PRN Meds:.acetaminophen **OR** acetaminophen, fentaNYL (SUBLIMAZE) injection, metoprolol tartrate, naLOXone (NARCAN)  injection, ondansetron (ZOFRAN) IV, sodium chloride flush   ASSESMENT:   Diarrhea.  Current.  Multiple courses of treatment for C. difficile.  Most recent C. difficile testing consistent with C. difficile colonization.  Should be finishing up course of oral vancomycin after 10 AM dose tomorrow.  Received 1 dose of azithromycin for stool PCR  detection of ETEC.  Consideration for FMT at [facility] vs Vowst (oral based FMT).    Severe protein calorie malnutrition.  TPN ongoing.  Diet advanced from clears to soft jon 11/14.  Not ready to wean off TPN.    Hyponatremia.     DM2.  Insulin in place.  Hemoglobin A1c reflects average blood glucose 108.  Outpatient diabetic meds.     FTT, weakness in setting of chronic illness and malnutrition   PLAN   Need clarification on what specialty and providers are looking into obtaining approval and arranging for Vowst if she qualifies.  Secure chat sent to multiple providers in hopes of clarifying this  question    Azucena Freed  03/14/2022, 10:36 AM Phone [phone]

## 2022-03-14 NOTE — Progress Notes (Signed)
Physical Therapy Treatment Patient Details Name: Julie Montgomery MRN: [id number] DOB: June 08, 1935 Today's Date: 03/14/2022   History of Present Illness Pt is an 86 year old woman who came to the ED on11/06 with distended abdomen, LE swelling, poor PO intake with dehydration. She was discharged 4 days prior with c-diff. Likely recurrent c-diff  and colonic ileus. PMH: CHF, dementia, PAF, chronic hyponatremia, anemia, TIA, osteopenia.    PT Comments    Pt able to walk with therapy in hallway using RW after working with OT at sink. Pt requires mod A for power up to RW, and min A for ambulation. Pt with decreased endurance for ambulation secondary to working with OT immediately prior and pt with increased SoB and coughing during session. D/c plans remain appropriate at this time. PT will continue to follow acutely.    Recommendations for follow up therapy are one component of a multi-disciplinary discharge planning process, led by the attending physician.  Recommendations may be updated based on patient status, additional functional criteria and insurance authorization.  Follow Up Recommendations  Home health PT ([facility])     Assistance Recommended at Discharge Frequent or constant Supervision/Assistance (pt with multiple hospitalizations and may need more than spousal support)  Patient can return home with the following A little help with walking and/or transfers;A little help with bathing/dressing/bathroom;Assistance with cooking/housework;Direct supervision/assist for medications management;Direct supervision/assist for financial management;Assist for transportation;Help with stairs or ramp for entrance;Assistance with feeding   Equipment Recommendations  None recommended by PT    Recommendations for Other Services       Precautions / Restrictions Precautions Precautions: Fall Precaution Comments: rectal pouch Restrictions Weight Bearing Restrictions: No     Mobility  Bed  Mobility               General bed mobility comments: sitting at sink working with OT on entry    Transfers Overall transfer level: Needs assistance Equipment used: Rolling walker (2 wheels) Transfers: Sit to/from Stand, Bed to chair/wheelchair/BSC Sit to Stand: Mod assist           General transfer comment: modA for pulling up on the sink to come to standing    Ambulation/Gait Ambulation/Gait assistance: Min assist Gait Distance (Feet): 50 Feet Assistive device: Rolling walker (2 wheels) Gait Pattern/deviations: Step-through pattern, Decreased stride length, Narrow base of support Gait velocity: decreased Gait velocity interpretation: <1.31 ft/sec, indicative of household ambulator   General Gait Details: slowed, steady gait, more fatigue today, requests to sit after 50 feet ambulation       Balance Overall balance assessment: Needs assistance   Sitting balance-Leahy Scale: Fair     Standing balance support: Bilateral upper extremity supported, No upper extremity supported, During functional activity Standing balance-Leahy Scale: Poor Standing balance comment: B UE support of RW                            Cognition Arousal/Alertness: Awake/alert Behavior During Therapy: Flat affect Overall Cognitive Status: No family/caregiver present to determine baseline cognitive functioning Area of Impairment: Memory, Problem solving, Awareness, Following commands, Safety/judgement                       Following Commands: Follows one step commands with increased time     Problem Solving: Slow processing, Difficulty sequencing, Requires verbal cues General Comments: continues to be pleasantly confused  General Comments General comments (skin integrity, edema, etc.): VSS on RA      Pertinent Vitals/Pain Pain Assessment Pain Assessment: Faces Faces Pain Scale: Hurts little more Pain Location: chest Pain Descriptors / Indicators:  Discomfort, Grimacing, Tightness, Sore Pain Intervention(s): Limited activity within patient's tolerance, Monitored during session, Repositioned     PT Goals (current goals can now be found in the care plan section) Acute Rehab PT Goals Patient Stated Goal: to go home PT Goal Formulation: With patient Time For Goal Achievement: 03/23/22 Potential to Achieve Goals: Fair Progress towards PT goals: Progressing toward goals    Frequency    Min 3X/week      PT Plan Current plan remains appropriate       AM-PAC PT "6 Clicks" Mobility   Outcome Measure  Help needed turning from your back to your side while in a flat bed without using bedrails?: A Lot Help needed moving from lying on your back to sitting on the side of a flat bed without using bedrails?: A Little Help needed moving to and from a bed to a chair (including a wheelchair)?: A Lot Help needed standing up from a chair using your arms (e.g., wheelchair or bedside chair)?: A Lot Help needed to walk in hospital room?: A Little Help needed climbing 3-5 steps with a railing? : Total 6 Click Score: 13    End of Session Equipment Utilized During Treatment: Gait belt Activity Tolerance: Patient tolerated treatment well Patient left: with call bell/phone within reach;in chair;with chair alarm set;with family/visitor present Nurse Communication: Mobility status PT Visit Diagnosis: Unsteadiness on feet (R26.81);Muscle weakness (generalized) (M62.81);Difficulty in walking, not elsewhere classified (R26.2);Pain Pain - part of body:  (abdomen)     Time: 6553-7482 PT Time Calculation (min) (ACUTE ONLY): 30 min  Charges:  $Gait Training: 8-22 mins                     Trampas Stettner B. Migdalia Dk PT, DPT Acute Rehabilitation Services Please use secure chat or  Call Office [phone]    [place] 03/14/2022, 1:25 PM

## 2022-03-14 NOTE — H&P (View-Only) (Signed)
Daily Rounding Note  03/14/2022, 10:36 AM  LOS: 8 days   SUBJECTIVE:   Chief complaint:    Diarrhea, recurrences of C. difficile associated diarrhea  Complains of weakness.  Tolerating soft diet but eating minimal amounts.  Diet is not causing any increase in abdominal pain or nausea, vomiting.  She endorses abdominal discomfort during exam. RN reports that the stools are more watery today. Recorded stool output of 750 mL yesterday, 150 mL so far today PT working with her but she remains very weak.    OBJECTIVE:         Vital signs in last 24 hours:    Temp:  [97.7 F (36.5 C)-98.1 F (36.7 C)] 97.7 F (36.5 C) (11/15 0428) Pulse Rate:  [86-94] 86 (11/15 0428) Resp:  [15-18] 16 (11/15 0428) BP: (119-138)/(61-74) 127/66 (11/15 0428) SpO2:  [95 %-99 %] 99 % (11/15 0428) Weight:  [52.2 kg] 52.2 kg (11/15 0500) Last BM Date : 03/13/22 Filed Weights   03/12/22 0432 03/13/22 0419 03/14/22 0500  Weight: 50.8 kg 51.9 kg 52.2 kg   General: Cachectic, chronically ill looking.  Frail. Heart: RRR Chest: Some dyspnea with speaking. Abdomen: Soft.  Diffuse, nonfocal minor tenderness.  Bowel sounds active.  Watery medium brown effluent in pouch collection bag.  There are about 220 cc Extremities: Anasarca. Neuro/Psych: Oriented.  Appropriate.  Not confused  Intake/Output from previous day: 11/14 0701 - 11/15 0700 In: -  Out: 150 [Stool:150]  Intake/Output this shift: No intake/output data recorded.  Lab Results: Recent Labs    03/12/22 0514 03/13/22 0521 03/14/22 0500  WBC 8.3 9.0 11.0*  HGB 10.1* 9.9* 9.4*  HCT 30.7* 29.3* 28.8*  PLT 235 206 200   BMET Recent Labs    03/12/22 1532 03/13/22 0521 03/14/22 0500  NA 129* 131* 129*  K 3.7 3.8 4.3  CL 94* 98 94*  CO2 '27 29 29  '$ GLUCOSE 149* 109* 137*  BUN '9 11 17  '$ CREATININE 0.32* <0.30* <0.30*  CALCIUM 8.0* 8.1* 8.1*   LFT Recent Labs    03/12/22 0514   PROT 4.2*  ALBUMIN 1.6*  AST 14*  ALT 13  ALKPHOS 77  BILITOT 0.1*   PT/INR No results for input(s): "LABPROT", "INR" in the last 72 hours. Hepatitis Panel No results for input(s): "HEPBSAG", "HCVAB", "HEPAIGM", "HEPBIGM" in the last 72 hours.  Studies/Results: No results found.  Scheduled Meds:  (feeding supplement) PROSource Plus  30 mL Oral TID BM   Chlorhexidine Gluconate Cloth  6 each Topical Daily   diltiazem  180 mg Oral Daily   enoxaparin (LOVENOX) injection  50 mg Subcutaneous Q12H   feeding supplement  1 Container Oral TID BM   insulin aspart  0-9 Units Subcutaneous Q4H   memantine  5 mg Oral BID   sodium chloride flush  10-40 mL Intracatheter Q12H   vancomycin  125 mg Oral BID   Vitamin D (Ergocalciferol)  50,000 Units Oral Q7 days   Continuous Infusions:  TPN ADULT (ION) 65 mL/hr at 03/13/22 1830   PRN Meds:.acetaminophen **OR** acetaminophen, fentaNYL (SUBLIMAZE) injection, metoprolol tartrate, naLOXone (NARCAN)  injection, ondansetron (ZOFRAN) IV, sodium chloride flush   ASSESMENT:   Diarrhea.  Current.  Multiple courses of treatment for C. difficile.  Most recent C. difficile testing consistent with C. difficile colonization.  Should be finishing up course of oral vancomycin after 10 AM dose tomorrow.  Received 1 dose of azithromycin for stool PCR  detection of ETEC.  Consideration for FMT at [facility] vs [facility] (oral based FMT).    Severe protein calorie malnutrition.  TPN ongoing.  Diet advanced from clears to soft jon 11/14.  Not ready to wean off TPN.    Hyponatremia.     DM2.  Insulin in place.  Hemoglobin A1c reflects average blood glucose 108.  Outpatient diabetic meds.     FTT, weakness in setting of chronic illness and malnutrition   PLAN   Need clarification on what specialty and providers are looking into obtaining approval and arranging for [facility] if she qualifies.  Secure chat sent to multiple providers in hopes of clarifying this  question    Julie Montgomery  03/14/2022, 10:36 AM Phone [phone]

## 2022-03-14 NOTE — Progress Notes (Signed)
PROGRESS NOTE    Julie Montgomery  HUD:[id number] DOB: 11/22/1935 DOA: 03/05/2022 PCP: Julie Anger, MD  Chief Complaint  Patient presents with   Fluid Retention    Brief Narrative:  Julie Montgomery is an 86 y.o. female with Julie Montgomery history of dementia, PAF, recurrent C diff, and chronic hyponatremia who presented 11/6 with decreased oral intake. Recent hospitalization from 10/25 to 02/28/22 for recurrent C diff colitis. She was discharge on oral vancomycin taper. At home her diarrhea was improving, taking imodium, though oral intake was very poor. She appeared dehydrated on exam with abdominal tenderness.     Na 129, K 4,3 CL 94 bicarbonate 23, glucose 104 bun 16 cr 0,49  Wbc 13,5 hgb 11,4 plt 356    Abdominal radiograph with marked dilatation of the ascending colon with interval worsening. Mild dilatation of the small bowel loops. Findings consistent with ileus.    CT abdomen and pelvis with persistent and mildly progressive colonic distention with air fluid levels, decreased caliber distal colon at the level of the mid sigmoid colon and increase caliber proximal small bowel loops with the left hemi abdomen. Fecalization of the prominent small bowel loops.    Chest radiograph with hyperinflation and small left pleural effusion, no infiltrates.    She was admitted and treated for recurrent C. diff though work up more suggestive of colonization. ID recommended oral vancomycin with taper. ETEC + in stool, given Julie Montgomery dose of azithromycin. General surgery initially consulted, signed off. GI consulted, recommended TPN, and ultimately signed off having no further recommendations. The patient's abdominal distention has improved, stool output decreased, though she continues to have significant abdominal pain and require rectal tube. Per family request, GI is asked for any additional recommendations they may have.  Assessment & Plan:   Principal Problem:   Ileus (Julie Montgomery) Active Problems:   Recurrent  Clostridioides difficile infection   Dehydration   Paroxysmal atrial fibrillation (HCC)   Anemia of chronic disease   Protein calorie malnutrition (HCC)   Goals of care, counseling/discussion   C. difficile diarrhea   Protein-calorie malnutrition, severe   Abnormal x-ray of abdomen   Adynamic ileus (HCC)   Assessment and Plan: Ileus: Favored Dx in setting of imodium over toxic megacolon at this time with relatively modest leukocytosis (normalized), nontoxic appearance and exam.  - Overall clearly improving: Leukocytosis normalized, remains afebrile. XR consistent with improving exam/distention.  - Pain is still significant. Continue analgesics. - Continue diet, can advance as tolerated, continue TPN.  - Surgery signed off.    Diarrhea: Remains profuse requiring tube.  - appreciate ID recommendations, plannning for flex sig   ETEC:  - s/p azithromycin x1 11/10. Supportive care for this for now. No hematochezia.   Recurrent Clostridioides difficile infection: Repeat work up was negative here, suspect colonization.  - Continue PO vancomycin down to BID for tail coverage per ID (final dose 11/15 pm), DC'ed flagyl per ID.    - Patient may benefit from outpatient FMT at tertiary care center after discharge.  ID submitted paperwork for approval for [facility] in conjunction with Dr. Hilarie Montgomery of GI. No response on authorization at this time. This is an outpatient medication.  - Contact isolation   Acute urinary retention, pyuria: No UCx growth. Passed TOV 11/11.    Severe protein calorie malnutrition:  - Continue TPN, started 11/11 thru PICC placed 11/11.    Third spacing, hypoalbuminemia:  - lasix + albumin   Dehydration: Resolved.  - DC IVF with  more edema (hypoalbuminemia and immobility also contributing) and TPN    Hypokalemia:  - Supplementing in TPN.     Hypomagnesemia:  - wnl   Hypophosphatemia:  - wnl   PAF: Has remained in sinus rhythm since admission.  - IV metoprolol  prn. - Given her risk factors and hx TIA, restarted anticoagulation. Lovenox for now with plan for procedure. - Once taking po reliably, restart home diltiazem, eliquis   Dementia: Appears mild. - Continue namenda.  - Family has been at bedside consistently.   Anemia of chronic disease: - No evidence of acute bleeding, monitor intermittently.   Vitamin D deficiency:  - Supplement 50k units weekly x8 weeks started 11/8 and recheck    DVT prophylaxis: lovenox Code Status: full Family Communication: husband Disposition:   Status is: Inpatient Remains inpatient appropriate because: pending   Consultants:  GI  Procedures:  none  Antimicrobials:  Anti-infectives (From admission, onward)    Start     Dose/Rate Route Frequency Ordered Stop   03/16/22 0800  vancomycin (VANCOCIN) capsule 125 mg  Status:  Discontinued       See Hyperspace for full Linked Orders Report.   125 mg Oral Daily with breakfast 03/06/22 0220 03/06/22 1240   03/09/22 2200  vancomycin (VANCOCIN) capsule 125 mg       Note to Pharmacy: 02/27/22 - 03/08/22: Take 1 capsule four times daily; 11/10- 11/16: take 1 capsule two times daily; 11/17 - 11/23: take 1 capsule once daily; from 11/24 and on, see other prescrption sent to [facility]     125 mg Oral 2 times daily 03/09/22 1635 03/15/22 0959   03/09/22 1730  azithromycin (ZITHROMAX) 1,000 mg in dextrose 5 % 250 mL IVPB        1,000 mg 260 mL/hr over 60 Minutes Intravenous Every 24 hours 03/09/22 1635 03/09/22 2024   03/09/22 0800  vancomycin (VANCOCIN) capsule 125 mg  Status:  Discontinued       See Hyperspace for full Linked Orders Report.   125 mg Oral 2 times daily with meals 03/06/22 0220 03/06/22 1240   03/06/22 1800  vancomycin (VANCOCIN) 500 mg in sodium chloride irrigation 0.9 % 100 mL ENEMA  Status:  Discontinued        500 mg Rectal Every 6 hours 03/06/22 1429 03/06/22 1528   03/06/22 1800  vancomycin (VANCOCIN) capsule 125 mg       Note to Pharmacy:  02/27/22 - 03/08/22: Take 1 capsule four times daily; 11/10- 11/16: take 1 capsule two times daily; 11/17 - 11/23: take 1 capsule once daily; from 11/24 and on, see other prescrption sent to [facility]     125 mg Oral Every 6 hours 03/06/22 1528 03/09/22 1300   03/06/22 1300  metroNIDAZOLE (FLAGYL) IVPB 500 mg  Status:  Discontinued        500 mg 100 mL/hr over 60 Minutes Intravenous Every 8 hours 03/06/22 1249 03/08/22 0838   03/06/22 0800  vancomycin (VANCOCIN) capsule 125 mg  Status:  Discontinued       Note to Pharmacy: 02/27/22 - 03/08/22: Take 1 capsule four times daily; 11/10- 11/16: take 1 capsule two times daily; 11/17 - 11/23: take 1 capsule once daily; from 11/24 and on, see other prescrption sent to [facility]     125 mg Oral 3 times daily before meals & bedtime 03/06/22 0216 03/06/22 1240       Subjective: C/o back pain from lying in bed  Objective: Vitals:   03/13/22 2000  03/14/22 0400 03/14/22 0428 03/14/22 0500  BP: 119/74  127/66   Pulse: 91  86   Resp: 15  16   Temp: 98 F (36.7 C)  97.7 F (36.5 C)   TempSrc: Oral  Oral   SpO2: 98% 99% 99%   Weight:    52.2 kg  Height:        Intake/Output Summary (Last 24 hours) at 03/14/2022 1630 Last data filed at 03/13/2022 2000 Gross per 24 hour  Intake --  Output 150 ml  Net -150 ml   Filed Weights   03/12/22 0432 03/13/22 0419 03/14/22 0500  Weight: 50.8 kg 51.9 kg 52.2 kg    Examination:  General exam: Appears calm and comfortable  Respiratory system: unlabored Cardiovascular system: RRR Gastrointestinal system: distended, mildly ttp Central nervous system: Alert and oriented. No focal neurological deficits. Extremities: no LEE   Data Reviewed: I have personally reviewed following labs and imaging studies  CBC: Recent Labs  Lab 03/10/22 0121 03/11/22 0615 03/12/22 0514 03/13/22 0521 03/14/22 0500  WBC 14.4* 10.1 8.3 9.0 11.0*  NEUTROABS 11.7* 8.0* 6.3 6.2 8.5*  HGB 11.3* 10.6* 10.1* 9.9* 9.4*  HCT 34.4*  31.3* 30.7* 29.3* 28.8*  MCV 91.2 90.2 90.6 89.9 92.3  PLT 386 267 235 206 053    Basic Metabolic Panel: Recent Labs  Lab 03/10/22 0121 03/11/22 1137 03/12/22 0514 03/12/22 1532 03/13/22 0521 03/14/22 0500  NA 130* 128* 129* 129* 131* 129*  K 4.1 2.8* 3.8 3.7 3.8 4.3  CL 100 96* 96* 94* 98 94*  CO2 '24 26 26 27 29 29  '$ GLUCOSE 112* 117* 138* 149* 109* 137*  BUN '10 9 9 9 11 17  '$ CREATININE 0.43* <0.30* <0.30* 0.32* <0.30* <0.30*  CALCIUM 8.2* 7.5* 7.8* 8.0* 8.1* 8.1*  MG 1.4*  --  1.8 1.6* 2.1 1.9  PHOS  --  1.8* 3.3 2.6 3.1 2.9    GFR: CrCl cannot be calculated (This lab value cannot be used to calculate CrCl because it is not Rafaela Dinius number: <0.30).  Liver Function Tests: Recent Labs  Lab 03/09/22 0147 03/10/22 0121 03/12/22 0514  AST 13* 18 14*  ALT '17 17 13  '$ ALKPHOS 59 67 77  BILITOT 0.3 <0.1* 0.1*  PROT 3.8* 4.4* 4.2*  ALBUMIN 1.5* 1.8* 1.6*    CBG: Recent Labs  Lab 03/14/22 0050 03/14/22 0428 03/14/22 0449 03/14/22 0840 03/14/22 1236  GLUCAP 80 144* 142* 142* 117*     Recent Results (from the past 240 hour(s))  Urine Culture     Status: None   Collection Time: 03/06/22  9:00 PM   Specimen: Urine, Catheterized  Result Value Ref Range Status   Specimen Description URINE, CATHETERIZED  Final   Special Requests NONE  Final   Culture   Final    NO GROWTH Performed at [facility], [place] [address] [place], [place] [address]    Report Status 03/07/2022 FINAL  Final  Gastrointestinal Panel by PCR , Stool     Status: Abnormal   Collection Time: 03/07/22  4:52 PM   Specimen: Stool  Result Value Ref Range Status   Campylobacter species NOT DETECTED NOT DETECTED Final   Plesimonas shigelloides NOT DETECTED NOT DETECTED Final   Salmonella species NOT DETECTED NOT DETECTED Final   Yersinia enterocolitica NOT DETECTED NOT DETECTED Final   Vibrio species NOT DETECTED NOT DETECTED Final   Vibrio cholerae NOT DETECTED NOT DETECTED Final   Enteroaggregative  E coli (EAEC) NOT  DETECTED NOT DETECTED Final   Enteropathogenic E coli (EPEC) NOT DETECTED NOT DETECTED Final   Enterotoxigenic E coli (ETEC) DETECTED (Christine Schiefelbein) NOT DETECTED Final    Comment: RESULT CALLED TO, READ BACK BY AND VERIFIED WITH: [facility] 03/08/22 0944 KLW    Shiga like toxin producing E coli (STEC) NOT DETECTED NOT DETECTED Final   Shigella/Enteroinvasive E coli (EIEC) NOT DETECTED NOT DETECTED Final   Cryptosporidium NOT DETECTED NOT DETECTED Final   Cyclospora cayetanensis NOT DETECTED NOT DETECTED Final   Entamoeba histolytica NOT DETECTED NOT DETECTED Final   Giardia lamblia NOT DETECTED NOT DETECTED Final   Adenovirus F40/41 NOT DETECTED NOT DETECTED Final   Astrovirus NOT DETECTED NOT DETECTED Final   Norovirus GI/GII NOT DETECTED NOT DETECTED Final   Rotavirus Kyriakos Babler NOT DETECTED NOT DETECTED Final   Sapovirus (I, II, IV, and V) NOT DETECTED NOT DETECTED Final    Comment: Performed at [facility], [address] [place], [place] [address]  C Difficile Quick Screen (NO PCR Reflex)     Status: None   Collection Time: 03/09/22  5:44 AM   Specimen: STOOL  Result Value Ref Range Status   C Diff antigen NEGATIVE NEGATIVE Final   C Diff toxin NEGATIVE NEGATIVE Final   C Diff interpretation No C. difficile detected.  Final    Comment: Performed at [facility], [place] [address], [place], [place] [address]         Radiology Studies: No results found.      Scheduled Meds:  (feeding supplement) PROSource Plus  30 mL Oral TID BM   Chlorhexidine Gluconate Cloth  6 each Topical Daily   diltiazem  180 mg Oral Daily   enoxaparin (LOVENOX) injection  50 mg Subcutaneous Q12H   feeding supplement  1 Container Oral TID BM   insulin aspart  0-9 Units Subcutaneous Q6H   memantine  5 mg Oral BID   sodium chloride flush  10-40 mL Intracatheter Q12H   vancomycin  125 mg Oral BID   Vitamin D (Ergocalciferol)  50,000 Units Oral Q7 days   Continuous Infusions:  TPN  ADULT (ION) 65 mL/hr at 03/13/22 1830   TPN ADULT (ION)       LOS: 8 days    Time spent: over 30 min    Fayrene Helper, MD [facility]   To contact the attending provider between 7A-7P or the covering provider during after hours 7P-7A, please log into the web site [email or web address] and access using universal [place] password for that web site. If you do not have the password, please call the hospital operator.  03/14/2022, 4:30 PM

## 2022-03-14 NOTE — Progress Notes (Signed)
Occupational Therapy Treatment Patient Details Name: Julie Montgomery MRN: [id number] DOB: 08-22-1935 Today's Date: 03/14/2022   History of present illness Pt is an 86 year old woman who came to the ED on11/06 with distended abdomen, LE swelling, poor PO intake with dehydration. She was discharged 4 days prior with c-diff. Likely recurrent c-diff  and colonic ileus. PMH: CHF, dementia, PAF, chronic hyponatremia, anemia, TIA, osteopenia.   OT comments  Pt progressing towards goals, completing standing ADLs with min guard A, pt needing rest break to sit after ~5 min. Pt able to complete seated BUE exercise, reports SOB and overall feeling "weak", however SpO2 above 95% throughout. Pt needing min A for bed mobility and standing from EOB. Pt presenting with impairments listed below, will follow acutely. Continue to recommend HHOT at d/c.   Recommendations for follow up therapy are one component of a multi-disciplinary discharge planning process, led by the attending physician.  Recommendations may be updated based on patient status, additional functional criteria and insurance authorization.    Follow Up Recommendations  Home health OT [facility])     Assistance Recommended at Discharge Frequent or constant Supervision/Assistance  Patient can return home with the following  A lot of help with walking and/or transfers;A lot of help with bathing/dressing/bathroom;Assistance with cooking/housework;Direct supervision/assist for medications management;Direct supervision/assist for financial management;Assist for transportation;Help with stairs or ramp for entrance   Equipment Recommendations  None recommended by OT    Recommendations for Other Services      Precautions / Restrictions Precautions Precautions: Fall Precaution Comments: rectal pouch Restrictions Weight Bearing Restrictions: No       Mobility Bed Mobility Overal bed mobility: Needs Assistance       Supine to sit: Min  assist     General bed mobility comments: assist for trunk elevation, cues to scoot hips to EOB    Transfers Overall transfer level: Needs assistance Equipment used: Rolling walker (2 wheels) Transfers: Sit to/from Stand, Bed to chair/wheelchair/BSC Sit to Stand: Min assist           General transfer comment: from EOB     Balance Overall balance assessment: Needs assistance Sitting-balance support: Feet supported Sitting balance-Leahy Scale: Fair     Standing balance support: Bilateral upper extremity supported, No upper extremity supported, During functional activity Standing balance-Leahy Scale: Poor Standing balance comment: B UE support of RW                           ADL either performed or assessed with clinical judgement   ADL Overall ADL's : Needs assistance/impaired     Grooming: Wash/dry face;Brushing hair;Standing Grooming Details (indicate cue type and reason): completed standing at sink x5 min                 Toilet Transfer: Min guard;Ambulation;Regular Toilet;Rolling walker (2 wheels) Toilet Transfer Details (indicate cue type and reason): simulated via functional mobility         Functional mobility during ADLs: Min guard;Rolling walker (2 wheels)      Extremity/Trunk Assessment Upper Extremity Assessment Upper Extremity Assessment: Generalized weakness (BUE edema noted)   Lower Extremity Assessment Lower Extremity Assessment: Defer to PT evaluation        Vision   Vision Assessment?: No apparent visual deficits   Perception     Praxis      Cognition Arousal/Alertness: Awake/alert Behavior During Therapy: Flat affect Overall Cognitive Status: No family/caregiver present to determine baseline  cognitive functioning Area of Impairment: Memory, Problem solving, Awareness, Following commands, Safety/judgement                       Following Commands: Follows one step commands with increased time     Problem  Solving: Slow processing, Difficulty sequencing, Requires verbal cues General Comments: needing increased cues to perform task, pleasantly confused        Exercises Exercises: Other exercises Other Exercises Other Exercises: BUE horizontal abduction x10    Shoulder Instructions       General Comments VSS on RA    Pertinent Vitals/ Pain       Pain Assessment Pain Assessment: Faces Pain Score: 4  Faces Pain Scale: Hurts little more Pain Location: back Pain Descriptors / Indicators: Discomfort Pain Intervention(s): Limited activity within patient's tolerance, Monitored during session, Repositioned  Home Living                                          Prior Functioning/Environment              Frequency  Min 2X/week        Progress Toward Goals  OT Goals(current goals can now be found in the care plan section)  Progress towards OT goals: Progressing toward goals  Acute Rehab OT Goals Patient Stated Goal: none stated OT Goal Formulation: With patient Time For Goal Achievement: 03/23/22 Potential to Achieve Goals: Good ADL Goals Pt Will Perform Eating: Independently;sitting Pt Will Perform Grooming: with min assist;standing Pt Will Perform Upper Body Bathing: with min assist;sitting Pt Will Perform Upper Body Dressing: with set-up;sitting Pt Will Transfer to Toilet: with min assist;ambulating;bedside commode Pt Will Perform Toileting - Clothing Manipulation and hygiene: with min assist;sit to/from stand Additional ADL Goal #1: Pt will perform bed mobility with min assist in preparation for ADLs.  Plan Discharge plan remains appropriate;Frequency remains appropriate    Co-evaluation                 AM-PAC OT "6 Clicks" Daily Activity     Outcome Measure   Help from another person eating meals?: A Little Help from another person taking care of personal grooming?: A Little Help from another person toileting, which includes using  toliet, bedpan, or urinal?: Total Help from another person bathing (including washing, rinsing, drying)?: A Lot Help from another person to put on and taking off regular upper body clothing?: A Lot Help from another person to put on and taking off regular lower body clothing?: Total 6 Click Score: 12    End of Session Equipment Utilized During Treatment: Rolling walker (2 wheels);Gait belt  OT Visit Diagnosis: Unsteadiness on feet (R26.81);Other abnormalities of gait and mobility (R26.89);Muscle weakness (generalized) (M62.81);Other symptoms and signs involving cognitive function;Pain   Activity Tolerance Patient limited by fatigue   Patient Left Other (comment) (seated at sink with PT)   Nurse Communication Mobility status        Time: 2458-0998 OT Time Calculation (min): 32 min  Charges: OT General Charges $OT Visit: 1 Visit OT Treatments $Self Care/Home Management : 8-22 mins $Therapeutic Activity: 8-22 mins  Lynnda Child, OTD, OTR/L Acute Rehab [phone]   Julie Montgomery 03/14/2022, 2:34 PM

## 2022-03-14 NOTE — Progress Notes (Signed)
This chaplain attempted F/U spiritual care as requested by the Pt. The Pt. declined a visit at this time. The chaplain accepted the Pt. request to talk to her visitor.   Chaplain Sallyanne Kuster [phone]

## 2022-03-14 NOTE — Progress Notes (Signed)
PHARMACY - TOTAL PARENTERAL NUTRITION CONSULT NOTE   Indication:  prolonged hospitalization with severe malnutrition/anasarca  Patient Measurements: Height: '5\' 2"'$  (157.5 cm) Weight: 52.2 kg (115 lb) IBW/kg (Calculated) : 50.1 TPN AdjBW (KG): 41.3 Body mass index is 21.03 kg/m. Usual Weight: 49.5 kg  Assessment:  [name] with malnutrition d/t multiple factors including dementia, recurrent C diff with multiple admissions and GI intolerance, and difficulty chewing with ill-fitting dentures. Pt had poor PO intake prior to admit and reports nut allergy and lactose intolerance. Pharmacy consulted for TPN in setting of malnutrition and inability to meet needs with PO intake.    Glucose / Insulin: A1c 5.4 - CBG 80-142, used 3u/SSI in 24 hours Electrolytes: Na 129, K 4.3, Mag 1.9, Phos 2.9,  CoCa 10, Cl 94 Renal: Scr <0.30, BUN 17 Hepatic: LFTs / TBili WNL, Trigs 70, albumin 1.6 Intake / Output; MIVF: UOP not documented; rectal tube in place for Cdiff 142m charted  GI Imaging: 11/6 CTAP: persistent colonic distention , cannot exclude toxic megacolon 11/9 Ab Xray: decrease in gas distention, improved ileus  11/10 Ab Xray: improvement in gaseous distention 11/12 Ab Xray: no bowel dilation to suggest obstruction/ileus  GI Surgeries / Procedures:   Central access: PICC placed 03/10/22 TPN start date: 03/10/2022  Nutritional Goals: Goal TPN rate is 65 mL/hr (provides 70 g of protein and 1464 kcals per day)  RD Assessment: Estimated Needs Total Energy Estimated Needs: 1300-1500 Total Protein Estimated Needs: 65-80g Total Fluid Estimated Needs: 1.3-1.5L  Current Nutrition:  Clear liquids 11/9 Thin/soft diet 11/14 > Boost TID - 2x dose charted Prosource TID - 3x doses charted  Plan:  Continue TPN at goal rate 65 mL/hr at 1800 - will provide 100% of needs (1464 kcal and 70 g protein) -patient has reported nut allergy (peanuts) - low risk of cross reactivity with SMOFLipid ingredient.  Upon asking for rxn/details, pt and pt spouse do not remember pt having a nut allergy. Regardless, RN aware and will monitor for any cross-reaction with lipids.    Electrolytes in TPN: Incr Na to 867m/L, K 6566mL, Ca 5mE14m, Mg 10 mEq/L, and Phos 25 mmol/L. Max Cl Add standard MVI and trace elements to TPN Initiate Sensitive q6h SSI and adjust as needed  Monitor TPN labs on Mon/Thurs, and daily until stable F/u diet toleration to determine ability to wean TPN  LoriErskine SpeedarmD Clinical Pharmacist 03/14/2022 11:26 AM

## 2022-03-14 NOTE — Progress Notes (Signed)
ANTICOAGULATION CONSULT NOTE  Pharmacy Consult for enoxaparin Indication: atrial fibrillation  Allergies  Allergen Reactions   Lactose Intolerance (Gi) Other (See Comments)    Intolerance    Lovastatin Other (See Comments)    Unknown reaction   Mometasone Furo-Formoterol Fum Other (See Comments)    Loss of appetite, laryngitis    Peanut-Containing Drug Products Other (See Comments)   Prozac [Fluoxetine Hcl] Other (See Comments)    Jumpy   Sulfa Antibiotics Other (See Comments)    Unknown reaction    Patient Measurements: Height: '5\' 2"'$  (157.5 cm) Weight: 52.2 kg (115 lb) IBW/kg (Calculated) : 50.1  Vital Signs: Temp: 97.7 F (36.5 C) (11/15 0428) Temp Source: Oral (11/15 0428) BP: 127/66 (11/15 0428) Pulse Rate: 86 (11/15 0428)  Labs: Recent Labs    03/12/22 0514 03/12/22 1532 03/13/22 0521 03/14/22 0500  HGB 10.1*  --  9.9* 9.4*  HCT 30.7*  --  29.3* 28.8*  PLT 235  --  206 200  CREATININE <0.30* 0.32* <0.30* <0.30*     CrCl cannot be calculated (This lab value cannot be used to calculate CrCl because it is not a number: <0.30).   Medical History: Past Medical History:  Diagnosis Date   Adenomatous polyp of colon 2020   Allergy    Anxiety    Basal cell carcinoma (BCC)    Cataract    Depression    Helicobacter pylori gastritis    Osteopenia    Paroxysmal atrial fibrillation (HCC)    Pneumonia    hx of 03/2008   Stroke [facility])    TIA long time ago     Assessment: 75 yoF admitted with ileus and poor PO intake. Pt on apixaban at home for hx PAF - last dose 11/7 ~1100. Pharmacy asked to dose enoxaparin while unable to give PO meds reliably.  CBC and Cr stable.  Goal of Therapy:  Anti-Xa level 0.6-1 units/ml 4hrs after LMWH dose given Monitor platelets by anticoagulation protocol: Yes   Plan:  Continue enoxaparin '50mg'$  (~'1mg'$ /kg) q12h Monitor CBC, s/sx of bleeding F/u GI plans, ability to transition to back to Eliquis   Arrie Senate, PharmD,  BCPS, [facility] Clinical Pharmacist [phone] Please check AMION for all [place] numbers 03/14/2022

## 2022-03-15 ENCOUNTER — Ambulatory Visit: Payer: Medicare Other | Admitting: Nurse Practitioner

## 2022-03-15 ENCOUNTER — Other Ambulatory Visit: Payer: Self-pay

## 2022-03-15 ENCOUNTER — Inpatient Hospital Stay (HOSPITAL_COMMUNITY): Payer: Medicare Other | Admitting: Anesthesiology

## 2022-03-15 ENCOUNTER — Encounter (HOSPITAL_COMMUNITY)
Admission: EM | Disposition: A | Discharge status: Home Health | Payer: Self-pay | Source: Home / Self Care | Attending: Family Medicine

## 2022-03-15 DIAGNOSIS — K6289 Other specified diseases of anus and rectum: Secondary | ICD-10-CM

## 2022-03-15 DIAGNOSIS — K529 Noninfective gastroenteritis and colitis, unspecified: Secondary | ICD-10-CM | POA: Diagnosis not present

## 2022-03-15 DIAGNOSIS — F418 Other specified anxiety disorders: Secondary | ICD-10-CM

## 2022-03-15 DIAGNOSIS — K6389 Other specified diseases of intestine: Secondary | ICD-10-CM | POA: Diagnosis not present

## 2022-03-15 DIAGNOSIS — K5730 Diverticulosis of large intestine without perforation or abscess without bleeding: Secondary | ICD-10-CM

## 2022-03-15 DIAGNOSIS — E860 Dehydration: Secondary | ICD-10-CM | POA: Diagnosis not present

## 2022-03-15 DIAGNOSIS — K567 Ileus, unspecified: Secondary | ICD-10-CM | POA: Diagnosis not present

## 2022-03-15 DIAGNOSIS — I480 Paroxysmal atrial fibrillation: Secondary | ICD-10-CM | POA: Diagnosis not present

## 2022-03-15 DIAGNOSIS — A498 Other bacterial infections of unspecified site: Secondary | ICD-10-CM | POA: Diagnosis not present

## 2022-03-15 HISTORY — PX: FLEXIBLE SIGMOIDOSCOPY: SHX5431

## 2022-03-15 HISTORY — PX: BIOPSY: SHX5522

## 2022-03-15 LAB — COMPREHENSIVE METABOLIC PANEL
ALT: 19 U/L (ref 0–44)
AST: 21 U/L (ref 15–41)
Albumin: 2.1 g/dL — ABNORMAL LOW (ref 3.5–5.0)
Alkaline Phosphatase: 93 U/L (ref 38–126)
Anion gap: 6 (ref 5–15)
BUN: 15 mg/dL (ref 8–23)
CO2: 27 mmol/L (ref 22–32)
Calcium: 8.4 mg/dL — ABNORMAL LOW (ref 8.9–10.3)
Chloride: 94 mmol/L — ABNORMAL LOW (ref 98–111)
Creatinine, Ser: <0.3 mg/dL — ABNORMAL LOW (ref 0.44–1.00)
Glucose, Bld: 134 mg/dL — ABNORMAL HIGH (ref 70–99)
Potassium: 5 mmol/L (ref 3.5–5.1)
Sodium: 127 mmol/L — ABNORMAL LOW (ref 135–145)
Total Bilirubin: <0.1 mg/dL — ABNORMAL LOW (ref 0.3–1.2)
Total Protein: 5.1 g/dL — ABNORMAL LOW (ref 6.5–8.1)

## 2022-03-15 LAB — CBC WITH DIFFERENTIAL/PLATELET
Abs Immature Granulocytes: 0.11 10*3/uL — ABNORMAL HIGH (ref 0.00–0.07)
Basophils Absolute: 0.1 10*3/uL (ref 0.0–0.1)
Basophils Relative: 1 %
Eosinophils Absolute: 0.1 10*3/uL (ref 0.0–0.5)
Eosinophils Relative: 1 %
HCT: 28.4 % — ABNORMAL LOW (ref 36.0–46.0)
Hemoglobin: 9.3 g/dL — ABNORMAL LOW (ref 12.0–15.0)
Immature Granulocytes: 1 %
Lymphocytes Relative: 10 %
Lymphs Abs: 1.1 10*3/uL (ref 0.7–4.0)
MCH: 30.2 pg (ref 26.0–34.0)
MCHC: 32.7 g/dL (ref 30.0–36.0)
MCV: 92.2 fL (ref 80.0–100.0)
Monocytes Absolute: 1.2 10*3/uL — ABNORMAL HIGH (ref 0.1–1.0)
Monocytes Relative: 11 %
Neutro Abs: 8.5 10*3/uL — ABNORMAL HIGH (ref 1.7–7.7)
Neutrophils Relative %: 76 %
Platelets: 196 10*3/uL (ref 150–400)
RBC: 3.08 MIL/uL — ABNORMAL LOW (ref 3.87–5.11)
RDW: 15.9 % — ABNORMAL HIGH (ref 11.5–15.5)
WBC: 11.1 10*3/uL — ABNORMAL HIGH (ref 4.0–10.5)
nRBC: 0 % (ref 0.0–0.2)

## 2022-03-15 LAB — PHOSPHORUS: Phosphorus: 3 mg/dL (ref 2.5–4.6)

## 2022-03-15 LAB — GLUCOSE, CAPILLARY
Glucose-Capillary: 118 mg/dL — ABNORMAL HIGH (ref 70–99)
Glucose-Capillary: 133 mg/dL — ABNORMAL HIGH (ref 70–99)
Glucose-Capillary: 137 mg/dL — ABNORMAL HIGH (ref 70–99)
Glucose-Capillary: 172 mg/dL — ABNORMAL HIGH (ref 70–99)

## 2022-03-15 LAB — MAGNESIUM: Magnesium: 1.9 mg/dL (ref 1.7–2.4)

## 2022-03-15 SURGERY — SIGMOIDOSCOPY, FLEXIBLE
Anesthesia: Monitor Anesthesia Care

## 2022-03-15 MED ORDER — TRAVASOL 10 % IV SOLN
INTRAVENOUS | Status: AC
  Filled 2022-03-15: qty 702

## 2022-03-15 MED ORDER — GERHARDT'S BUTT CREAM
TOPICAL_CREAM | Freq: Two times a day (BID) | CUTANEOUS | Status: DC
  Administered 2022-03-15 – 2022-03-26 (×6): 1 via TOPICAL
  Filled 2022-03-15 (×7): qty 1

## 2022-03-15 MED ORDER — ENSURE ENLIVE PO LIQD
237.0000 mL | ORAL | Status: DC
  Administered 2022-03-15 – 2022-03-20 (×6): 237 mL via ORAL
  Filled 2022-03-15 (×6): qty 237

## 2022-03-15 MED ORDER — CHOLESTYRAMINE 4 G PO PACK
4.0000 g | PACK | Freq: Every day | ORAL | Status: DC
  Administered 2022-03-15: 4 g via ORAL
  Filled 2022-03-15 (×3): qty 1

## 2022-03-15 MED ORDER — LACTATED RINGERS IV SOLN: INTRAVENOUS | Status: DC | PRN

## 2022-03-15 MED ORDER — PROPOFOL 500 MG/50ML IV EMUL
INTRAVENOUS | Status: DC | PRN
  Administered 2022-03-15: 60 ug/kg/min via INTRAVENOUS

## 2022-03-15 MED ORDER — SODIUM CHLORIDE 0.9 % IV SOLN: INTRAVENOUS | Status: DC

## 2022-03-15 NOTE — Progress Notes (Signed)
PROGRESS NOTE    Julie Montgomery  HCW:[id number] DOB: 11-Feb-1936 DOA: 03/05/2022 PCP: Cassandria Anger, MD  Chief Complaint  Patient presents with   Fluid Retention    Brief Narrative:  Julie Montgomery is an 86 y.o. female with Omarie Parcell history of dementia, PAF, recurrent C diff, and chronic hyponatremia who presented 11/6 with decreased oral intake. Recent hospitalization from 10/25 to 02/28/22 for recurrent C diff colitis. She was discharge on oral vancomycin taper. At home her diarrhea was improving, taking imodium, though oral intake was very poor. She appeared dehydrated on exam with abdominal tenderness.     Na 129, K 4,3 CL 94 bicarbonate 23, glucose 104 bun 16 cr 0,49  Wbc 13,5 hgb 11,4 plt 356    Abdominal radiograph with marked dilatation of the ascending colon with interval worsening. Mild dilatation of the small bowel loops. Findings consistent with ileus.    CT abdomen and pelvis with persistent and mildly progressive colonic distention with air fluid levels, decreased caliber distal colon at the level of the mid sigmoid colon and increase caliber proximal small bowel loops with the left hemi abdomen. Fecalization of the prominent small bowel loops.    Chest radiograph with hyperinflation and small left pleural effusion, no infiltrates.    She was admitted and treated for recurrent C. diff though work up more suggestive of colonization. ID recommended oral vancomycin with taper. ETEC + in stool, given Chrissi Crow dose of azithromycin. General surgery initially consulted, signed off. GI consulted, recommended TPN, and ultimately signed off having no further recommendations. The patient's abdominal distention has improved, stool output decreased, though she continues to have significant abdominal pain and require rectal tube. Per family request, GI is asked for any additional recommendations they may have.  Assessment & Plan:   Principal Problem:   Ileus ([place]) Active Problems:   Recurrent  Clostridioides difficile infection   Dehydration   Paroxysmal atrial fibrillation (HCC)   Anemia of chronic disease   Protein calorie malnutrition (HCC)   Goals of care, counseling/discussion   C. difficile diarrhea   Protein-calorie malnutrition, severe   Abnormal x-ray of abdomen   Adynamic ileus (HCC)   Assessment and Plan: Ileus: Favored Dx in setting of imodium over toxic megacolon at this time with relatively modest leukocytosis (normalized), nontoxic appearance and exam.  - Overall improving.  Does have mild leukocytosis today, will follow  - Pain is still significant. Continue analgesics. - Continue diet, can advance as tolerated, continue TPN.  - Surgery signed off.    Diarrhea:  - appreciate GI recommendations, plannning for flex sig -> erythematous and congested mucosa in sigmoid colon, biopsied, awaiting path results - GI considering steroids based on path results vs vowst.  Starting cholestyramine tonight - rectal tube removed, some ulcerations noted - hold off on replacement of tube, wound care with butt cream  Hyponatremia  - due to volume overload, follow with diuresis   Third spacing, hypoalbuminemia:  - lasix + albumin - strict I/O, daily weights - preserved EF in August.  No protein on UA.  ETEC:  - s/p azithromycin x1 11/10. Supportive care for this for now. No hematochezia.   Recurrent Clostridioides difficile infection: Repeat work up was negative here, suspect colonization.  - C diff treatment completed.    - Per GI discussion with ID, looking into other causes of chronic diarrhea as noted above - Patient may benefit from outpatient FMT at tertiary care center after discharge.  ID submitted paperwork for  approval for Vowst in conjunction with Dr. Hilarie Fredrickson of GI. No response on authorization at this time. This is an outpatient medication.  - Contact isolation   Acute urinary retention, pyuria: No UCx growth. Passed TOV 11/11.    Severe protein calorie  malnutrition:  - Continue TPN, started 11/11 thru PICC placed 11/11.     Dehydration: Resolved.  - DC IVF with more edema (hypoalbuminemia and immobility also contributing) and TPN    Hypokalemia:  - Supplementing in TPN.     Hypomagnesemia:  - wnl   Hypophosphatemia:  - wnl   PAF: Has remained in sinus rhythm since admission.  - IV metoprolol prn. - Given her risk factors and hx TIA, restarted anticoagulation. Lovenox for now with procedure. - Once taking po reliably, restart home diltiazem, eliquis   Dementia: Appears mild. - Continue namenda.  - Family has been at bedside consistently.   Anemia of chronic disease: - No evidence of acute bleeding, monitor intermittently.   Vitamin D deficiency:  - Supplement 50k units weekly x8 weeks started 11/8 and recheck    DVT prophylaxis: lovenox Code Status: full Family Communication: husband Disposition:   Status is: Inpatient Remains inpatient appropriate because: pending   Consultants:  GI  Procedures:  none  Antimicrobials:  Anti-infectives (From admission, onward)    Start     Dose/Rate Route Frequency Ordered Stop   03/16/22 0800  vancomycin (VANCOCIN) capsule 125 mg  Status:  Discontinued       See Hyperspace for full Linked Orders Report.   125 mg Oral Daily with breakfast 03/06/22 0220 03/06/22 1240   03/09/22 2200  vancomycin (VANCOCIN) capsule 125 mg       Note to Pharmacy: 02/27/22 - 03/08/22: Take 1 capsule four times daily; 11/10- 11/16: take 1 capsule two times daily; 11/17 - 11/23: take 1 capsule once daily; from 11/24 and on, see other prescrption sent [facility]     125 mg Oral 2 times daily 03/09/22 1635 03/14/22 2126   03/09/22 1730  azithromycin (ZITHROMAX) 1,000 mg in dextrose 5 % 250 mL IVPB        1,000 mg 260 mL/hr over 60 Minutes Intravenous Every 24 hours 03/09/22 1635 03/09/22 2024   03/09/22 0800  vancomycin (VANCOCIN) capsule 125 mg  Status:  Discontinued       See Hyperspace for full  Linked Orders Report.   125 mg Oral 2 times daily with meals 03/06/22 0220 03/06/22 1240   03/06/22 1800  vancomycin (VANCOCIN) 500 mg in sodium chloride irrigation 0.9 % 100 mL ENEMA  Status:  Discontinued        500 mg Rectal Every 6 hours 03/06/22 1429 03/06/22 1528   03/06/22 1800  vancomycin (VANCOCIN) capsule 125 mg       Note to Pharmacy: 02/27/22 - 03/08/22: Take 1 capsule four times daily; 11/10- 11/16: take 1 capsule two times daily; 11/17 - 11/23: take 1 capsule once daily; from 11/24 and on, see other prescrption [facility]     125 mg Oral Every 6 hours 03/06/22 1528 03/09/22 1300   03/06/22 1300  metroNIDAZOLE (FLAGYL) IVPB 500 mg  Status:  Discontinued        500 mg 100 mL/hr over 60 Minutes Intravenous Every 8 hours 03/06/22 1249 03/08/22 0838   03/06/22 0800  vancomycin (VANCOCIN) capsule 125 mg  Status:  Discontinued       Note to Pharmacy: 02/27/22 - 03/08/22: Take 1 capsule four times daily; 11/10-  11/16: take 1 capsule two times daily; 11/17 - 11/23: take 1 capsule once daily; from 11/24 and on, see other prescrption [facility]     125 mg Oral 3 times daily before meals & bedtime 03/06/22 0216 03/06/22 1240       Subjective: No new complaints  Objective: Vitals:   03/15/22 1216 03/15/22 1221 03/15/22 1230 03/15/22 1240  BP: (!) 127/55 100/69 (!) 131/55 123/60  Pulse: 82 79 83 84  Resp: 18 (!) 21 (!) 23 20  Temp: 97.7 F (36.5 C)     TempSrc: Temporal     SpO2: 97% 94% 99% 98%  Weight:      Height:        Intake/Output Summary (Last 24 hours) at 03/15/2022 1949 Last data filed at 03/15/2022 1306 Gross per 24 hour  Intake 150 ml  Output 1150 ml  Net -1000 ml   Filed Weights   03/13/22 0419 03/14/22 0500 03/15/22 0520  Weight: 51.9 kg 52.2 kg 52.3 kg    Examination:  General: No acute distress. Cardiovascular: RRR Lungs: unlabored Abdomen: mildly distended Neurological: Alert and oriented 3. Moves all extremities 4 with equal strength. Cranial  nerves II through XII grossly intact. Extremities: No clubbing or cyanosis. No edema.   Data Reviewed: I have personally reviewed following labs and imaging studies  CBC: Recent Labs  Lab 03/11/22 0615 03/12/22 0514 03/13/22 0521 03/14/22 0500 03/15/22 0450  WBC 10.1 8.3 9.0 11.0* 11.1*  NEUTROABS 8.0* 6.3 6.2 8.5* 8.5*  HGB 10.6* 10.1* 9.9* 9.4* 9.3*  HCT 31.3* 30.7* 29.3* 28.8* 28.4*  MCV 90.2 90.6 89.9 92.3 92.2  PLT 267 235 206 200 676    Basic Metabolic Panel: Recent Labs  Lab 03/12/22 0514 03/12/22 1532 03/13/22 0521 03/14/22 0500 03/15/22 0450  NA 129* 129* 131* 129* 127*  K 3.8 3.7 3.8 4.3 5.0  CL 96* 94* 98 94* 94*  CO2 '26 27 29 29 27  '$ GLUCOSE 138* 149* 109* 137* 134*  BUN '9 9 11 17 15  '$ CREATININE <0.30* 0.32* <0.30* <0.30* <0.30*  CALCIUM 7.8* 8.0* 8.1* 8.1* 8.4*  MG 1.8 1.6* 2.1 1.9 1.9  PHOS 3.3 2.6 3.1 2.9 3.0    GFR: CrCl cannot be calculated (This lab value cannot be used to calculate CrCl because it is not [place] number: <0.30).  Liver Function Tests: Recent Labs  Lab 03/09/22 0147 03/10/22 0121 03/12/22 0514 03/15/22 0450  AST 13* 18 14* 21  ALT '17 17 13 19  '$ ALKPHOS 59 67 77 93  BILITOT 0.3 <0.1* 0.1* <0.1*  PROT 3.8* 4.4* 4.2* 5.1*  ALBUMIN 1.5* 1.8* 1.6* 2.1*    CBG: Recent Labs  Lab 03/14/22 1633 03/15/22 0027 03/15/22 0445 03/15/22 1317 03/15/22 1804  GLUCAP 163* 118* 137* 133* 172*     Recent Results (from the past 240 hour(s))  Urine Culture     Status: None   Collection Time: 03/06/22  9:00 PM   Specimen: Urine, Catheterized  Result Value Ref Range Status   Specimen Description URINE, CATHETERIZED  Final   Special Requests NONE  Final   Culture   Final    NO GROWTH Performed at [facility], [place] [address] [place], [place] [address]    Report Status 03/07/2022 FINAL  Final  Gastrointestinal Panel by PCR , Stool     Status: Abnormal   Collection Time: 03/07/22  4:52 PM   Specimen: Stool  Result Value Ref  Range Status  Campylobacter species NOT DETECTED NOT DETECTED Final   Plesimonas shigelloides NOT DETECTED NOT DETECTED Final   Salmonella species NOT DETECTED NOT DETECTED Final   Yersinia enterocolitica NOT DETECTED NOT DETECTED Final   Vibrio species NOT DETECTED NOT DETECTED Final   Vibrio cholerae NOT DETECTED NOT DETECTED Final   Enteroaggregative E coli (EAEC) NOT DETECTED NOT DETECTED Final   Enteropathogenic E coli (EPEC) NOT DETECTED NOT DETECTED Final   Enterotoxigenic E coli (ETEC) DETECTED (Locklan Canoy) NOT DETECTED Final    Comment: RESULT CALLED TO, READ BACK BY AND VERIFIED WITH: [facility] 03/08/22 0944 KLW    Shiga like toxin producing E coli (STEC) NOT DETECTED NOT DETECTED Final   Shigella/Enteroinvasive E coli (EIEC) NOT DETECTED NOT DETECTED Final   Cryptosporidium NOT DETECTED NOT DETECTED Final   Cyclospora cayetanensis NOT DETECTED NOT DETECTED Final   Entamoeba histolytica NOT DETECTED NOT DETECTED Final   Giardia lamblia NOT DETECTED NOT DETECTED Final   Adenovirus F40/41 NOT DETECTED NOT DETECTED Final   Astrovirus NOT DETECTED NOT DETECTED Final   Norovirus GI/GII NOT DETECTED NOT DETECTED Final   Rotavirus Keisi Eckford NOT DETECTED NOT DETECTED Final   Sapovirus (I, II, IV, and V) NOT DETECTED NOT DETECTED Final    Comment: Performed at [facility], [address] [place], [place] [address]  C Difficile Quick Screen (NO PCR Reflex)     Status: None   Collection Time: 03/09/22  5:44 AM   Specimen: STOOL  Result Value Ref Range Status   C Diff antigen NEGATIVE NEGATIVE Final   C Diff toxin NEGATIVE NEGATIVE Final   C Diff interpretation No C. difficile detected.  Final    Comment: Performed at [facility], [place] [address] [place], [place] [address]         Radiology Studies: No results found.      Scheduled Meds:  (feeding supplement) PROSource Plus  30 mL Oral TID BM   Chlorhexidine Gluconate Cloth  6 each Topical Daily   cholestyramine  4  g Oral QHS   diltiazem  180 mg Oral Daily   enoxaparin (LOVENOX) injection  50 mg Subcutaneous Q12H   feeding supplement  1 Container Oral TID BM   feeding supplement  237 mL Oral Q24H   furosemide  40 mg Intravenous Daily   Gerhardt's butt cream   Topical BID   insulin aspart  0-9 Units Subcutaneous Q6H   lidocaine  1 patch Transdermal Q24H   memantine  5 mg Oral BID   sodium chloride flush  10-40 mL Intracatheter Q12H   Vitamin D (Ergocalciferol)  50,000 Units Oral Q7 days   Continuous Infusions:  albumin human 25 g (03/15/22 0904)   TPN ADULT (ION) 65 mL/hr at 03/15/22 1813     LOS: 9 days    Time spent: over 30 min    Fayrene Helper, MD [facility]   To contact the attending provider between 7A-7P or the covering provider during after hours 7P-7A, please log into the web site [email or web address] and access using universal [place] password for that web site. If you do not have the password, please call the hospital operator.  03/15/2022, 7:49 PM

## 2022-03-15 NOTE — Telephone Encounter (Signed)
Left message for pt husband Kentley Cedillo  to call back:

## 2022-03-15 NOTE — Progress Notes (Signed)
Daily Progress Note   Patient Name: Julie Montgomery       Date: 03/15/2022 DOB: 1936/03/30  Age: 86 y.o. MRN#: [id number] Attending Physician: Elodia Florence., * Primary Care Physician: Cassandria Anger, MD Admit Date: 03/05/2022  Reason for Consultation/Follow-up: Establishing goals of care  Subjective: Medical records reviewed including progress notes, labs, imaging. Patient assessed at the bedside.  She reports feeling about the same and taking things "day by day." Her husband is present at the bedside.  Created space and opportunity for patient and family's thoughts and feelings on her current illness. She is not sure how she is feeling and notes she has been reassured by RN throughout the morning. Patient and family share that all risks and benefits of sigmoidoscopy have been discussed thoroughly. They feel there are not many options for achieving improvement other than proceeding with additional testing. We discussed the importance of continued conversation with family and the medical providers regarding overall plan of care and treatment options, ensuring decisions are within the context of the patient's values and GOCs. Also discussed risk of sedation and potential lingering symptoms of delirium, agitation. Patient nods in understanding and does not wish to discuss further today, but would like another visit from PMT for support.  Questions and concerns addressed. PMT will continue to support holistically.   Length of Stay: 9  Physical Exam Vitals and nursing note reviewed.  Constitutional:      General: She is not in acute distress.    Appearance: She is cachectic. She is ill-appearing.  Cardiovascular:     Rate and Rhythm: Normal rate.  Pulmonary:     Effort: Pulmonary  effort is normal.  Skin:    General: Skin is warm and dry.  Neurological:     Mental Status: She is alert. Mental status is at baseline.  Psychiatric:        Behavior: Behavior is cooperative.   Vital Signs: BP 137/73 (BP Location: Left Arm)   Pulse 78   Temp 98.4 F (36.9 C) (Oral)   Resp 16   Ht '5\' 2"'$  (1.575 m)   Wt 52.3 kg   SpO2 99%   BMI 21.11 kg/m  SpO2: SpO2: 99 % O2 Device: O2 Device: Room Air O2 Flow Rate: O2 Flow Rate (L/min): 2 L/min  Palliative Assessment/Data: 40% at best   Palliative Care Assessment & Plan   Patient Profile: 86 y.o. female  with past medical history of  dementia, recurrent C. difficile, paroxysmal atrial fibrillation chronically anticoagulated on Eliquis, chronic hyponatremia, anemia of chronic disease associated baseline hemoglobin range 11-13, admitted on 03/05/2022 with poor oral intake and dehydration, diarrhea.    Patient has been hospitalized several times for recurrent C. difficile infections (9/14-9/16, 10/10-10/16, 10/26-11/1).  She is currently admitted again for AKI, hypercalcemia, ongoing management of C. difficile infection.  In the ED, CT of the abdomen and pelvis showed mildly progressive colonic distention with air-fluid levels and diminished bowel transit concerning for toxic megacolon, no signs of pneumatosis or bowel perforation.  Patient is a poor candidate for endoscopic or surgical intervention.   PMT has been consulted to assist with goals of care conversation.  Assessment: Goals of care conversation Recurrent C. difficile Colonic ileus  Recommendations/Plan: Continue full code/full scope treatment Patient and family remain very hopeful for improvement Patient will benefit from outpatient palliative care referral when medically stable for discharge Psychosocial emotional support provided PMT will see patient again over the weekend. Please secure chat or call team line should urgent needs arise   Prognosis:   Unable to determine  Discharge Planning: To Be Determined  Care plan was discussed with patient, patient's husband, RN   MDM high         [place], PA-C  Palliative Medicine Team Team phone # [phone]  Thank you for allowing the Palliative Medicine Team to assist in the care of this patient. Please utilize secure chat with additional questions, if there is no response within 30 minutes please call the above phone number.  Palliative Medicine Team providers are available by phone from 7am to 7pm daily and can be reached through the team cell phone.  Should this patient require assistance outside of these hours, please call the patient's attending physician.

## 2022-03-15 NOTE — Interval H&P Note (Signed)
History and Physical Interval Note: For flex sig this am to further eval chronic diarrhea after c diff treatment and now neg c diff testing HIGHER THAN BASELINE RISK.The nature of the procedure, as well as the risks, benefits, and alternatives were carefully and thoroughly reviewed with the patient. Ample time for discussion and questions allowed. The patient understood, was satisfied, and agreed to proceed.    03/15/2022 11:35 AM  Julie Montgomery  has presented today for surgery, with the diagnosis of chronic diarrhea.  The various methods of treatment have been discussed with the patient and family. After consideration of risks, benefits and other options for treatment, the patient has consented to  Procedure(s): FLEXIBLE SIGMOIDOSCOPY (N/A) as a surgical intervention.  The patient's history has been reviewed, patient examined, no change in status, stable for surgery.  I have reviewed the patient's chart and labs.  Questions were answered to the patient's satisfaction.     Lajuan Lines Gerica Koble

## 2022-03-15 NOTE — Anesthesia Postprocedure Evaluation (Signed)
Anesthesia Post Note  Patient: Julie Montgomery  Procedure(s) Performed: FLEXIBLE SIGMOIDOSCOPY BIOPSY     Patient location during evaluation: PACU Anesthesia Type: MAC Level of consciousness: awake and alert Pain management: pain level controlled Vital Signs Assessment: post-procedure vital signs reviewed and stable Respiratory status: spontaneous breathing Cardiovascular status: stable Anesthetic complications: no   No notable events documented.  Last Vitals:  Vitals:   03/15/22 1230 03/15/22 1240  BP: (!) 131/55 123/60  Pulse: 83 84  Resp: (!) 23 20  Temp:    SpO2: 99% 98%    Last Pain:  Vitals:   03/15/22 1230  TempSrc:   PainSc: 0-No pain                 Nolon Nations

## 2022-03-15 NOTE — Anesthesia Procedure Notes (Signed)
Procedure Name: MAC Date/Time: 03/15/2022 11:42 AM  Performed by: Lorie Phenix, CRNAPre-anesthesia Checklist: Patient identified, Emergency Drugs available, Patient being monitored and Suction available Patient Re-evaluated:Patient Re-evaluated prior to induction Oxygen Delivery Method: Nasal cannula Dental Injury: Teeth and Oropharynx as per pre-operative assessment

## 2022-03-15 NOTE — Op Note (Signed)
[facility] Patient Name: Julie Montgomery Procedure Date : 03/15/2022 MRN: [id number] Attending MD: Jerene Bears , MD, [id number] Date of Birth: August 23, 1935 CSN: [id number] Age: 86 Admit Type: Inpatient Procedure:                Flexible Sigmoidoscopy Indications:              Chronic, persistent diarrhea with hx of C difficile                            but now s/p treatment with resolution of infection                            but ongoing diarrhea Providers:                Lajuan Lines. Hilarie Fredrickson, MD, Mikey College, RN, Benetta Spar, Technician Referring MD:             [facility] Medicines:                Propofol per Anesthesia Complications:            No immediate complications. Estimated Blood Loss:     Estimated blood loss was minimal. Procedure:                Pre-Anesthesia Assessment:                           - Prior to the procedure, a History and Physical                            was performed, and patient medications and                            allergies were reviewed. The patient's tolerance of                            previous anesthesia was also reviewed. The risks                            and benefits of the procedure and the sedation                            options and risks were discussed with the patient.                            All questions were answered, and informed consent                            was obtained. Prior Anticoagulants: The patient has                            taken no anticoagulant or antiplatelet agents. ASA  Grade Assessment: III - A patient with severe                            systemic disease. After reviewing the risks and                            benefits, the patient was deemed in satisfactory                            condition to undergo the procedure.                           After obtaining informed consent, the scope was                             passed under direct vision. The [id number]-H190 ([id number])                            Olympus endoscope was introduced through the anus                            and advanced to the descending colon. The flexible                            sigmoidoscopy was accomplished without difficulty.                            The patient tolerated the procedure well. No bowel                            preparation was given prior to the procedure. The                            quality of visualization was adequate. Scope In: 11:51:03 AM Scope Out: 11:58:59 AM Total Procedure Duration: 0 hours 7 minutes 56 seconds  Findings:      The digital rectal exam was normal.      A diffuse area of moderately erythematous, congested mucosa was found in       the sigmoid colon. Mucosa with altered vascularity. This appears diffuse       beginning proximal to the rectosigmoid colon. Nonspecific in nature.       This was biopsied with a cold forceps for histology.      Multiple medium-mouthed and small-mouthed diverticula were found in the       sigmoid colon.      The rectum appeared normal with exception of minor excoriation in very       distal rectum due to recent requirement for rectal tube. Impression:               - Erythematous and congested mucosa in the sigmoid                            colon. Biopsied to evaluate for colitis.                           -  Moderate diverticulosis in the sigmoid colon.                           - Normal rectal mucosa with mild trauma from rectal                            tube. Moderate Sedation:      N/A Recommendation:           - Return patient to hospital ward for ongoing care.                           - Advance diet as tolerated.                           - Continue present medications.                           - Await pathology results. Will consider initiation                            of steroid therapy based on pathology results.                             Vowst will also be considered based on these                            results. Cholestyramine 4 g to start tonight.                           - GI service will follow. Procedure Code(s):        --- Professional ---                           [phone], Sigmoidoscopy, flexible; with biopsy, single                            or multiple Diagnosis Code(s):        --- Professional ---                           K63.89, Other specified diseases of intestine                           K52.9, Noninfective gastroenteritis and colitis,                            unspecified                           K57.30, Diverticulosis of large intestine without                            perforation or abscess without bleeding CPT copyright 2022 American [employer] Association. All rights reserved. The codes documented in this report are preliminary and upon coder review may  be revised to meet current compliance requirements. Jerene Bears, MD  03/15/2022 12:36:37 PM This report has been signed electronically. Number of Addenda: 0

## 2022-03-15 NOTE — Anesthesia Preprocedure Evaluation (Addendum)
Anesthesia Evaluation  Patient identified by MRN, date of birth, ID band Patient awake    Reviewed: Allergy & Precautions, NPO status , [name] Chart, lab work & pertinent test results  Airway Mallampati: II  TM Distance: >3 FB Neck ROM: Full    Dental  (+) Edentulous Upper, Edentulous Lower, Dental Advisory Given   Pulmonary shortness of breath, pneumonia   Pulmonary exam normal breath sounds clear to auscultation       Cardiovascular + CAD  Normal cardiovascular exam+ Valvular Problems/Murmurs MR  Rhythm:Regular Rate:Normal  Echo   1. Left ventricular ejection fraction, by estimation, is 55 to 60%. The left ventricle has normal function. The left ventricle has no regional wall motion abnormalities. Left ventricular diastolic function could not be evaluated.   2. Right ventricular systolic function is low normal. The right ventricular size is normal. There is normal pulmonary artery systolic pressure. The estimated right ventricular systolic pressure is 02.4 mmHg.   3. The mitral valve is degenerative. Mild mitral valve regurgitation. No evidence of mitral stenosis.   4. The aortic valve is grossly normal. Aortic valve regurgitation is not visualized. Aortic valve sclerosis/calcification is present, without any evidence of aortic stenosis.   5. The inferior vena cava is normal in size with greater than 50% respiratory variability, suggesting right atrial pressure of 3 mmHg.   6. Rhythm strip during this exam demonstrates Afib RVR.   Comparison(s): No prior Echocardiogram.     Neuro/Psych  PSYCHIATRIC DISORDERS Anxiety Depression     Neuromuscular disease CVA    GI/Hepatic negative GI ROS, Neg liver ROS,,,  Endo/Other  negative endocrine ROS    Renal/GU negative Renal ROS     Musculoskeletal negative musculoskeletal ROS (+)    Abdominal   Peds  Hematology  (+) Blood dyscrasia, anemia   Anesthesia Other Findings    Reproductive/Obstetrics                             Anesthesia Physical Anesthesia Plan  ASA: 3  Anesthesia Plan: MAC   Post-op Pain Management: Minimal or no pain anticipated   Induction: Intravenous  PONV Risk Score and Plan: 2 and Ondansetron, Treatment may vary due to age or medical condition, Propofol infusion and TIVA  Airway Management Planned: Natural Airway  Additional Equipment:   Intra-op Plan:   Post-operative Plan:   Informed Consent: I have reviewed the patients History and Physical, chart, labs and discussed the procedure including the risks, benefits and alternatives for the proposed anesthesia with the patient or authorized representative who has indicated his/her understanding and acceptance.     Dental advisory given  Plan Discussed with: CRNA  Anesthesia Plan Comments:         Anesthesia Quick Evaluation

## 2022-03-15 NOTE — Progress Notes (Signed)
Mobility Specialist - Progress Note   03/15/22 1016  Mobility  Activity Dangled on edge of bed  Level of Assistance Minimal assist, patient does 75% or more  Assistive Device Other (Comment) (bED RAILS)  Activity Response Tolerated well  Mobility Referral Yes  $Mobility charge 1 Mobility   Pt was received in bed and agreeable to mobility. Session was limited d/t rectal pouch becoming un attached along with transport arriving to take pt to procedure. Will follow up for hallway mobility. If time allows.   Franki Monte  Mobility Specialist Please contact via Solicitor or Rehab office at [phone]

## 2022-03-15 NOTE — Progress Notes (Signed)
Nutrition Follow-up  DOCUMENTATION CODES:  Severe malnutrition in context of chronic illness, Underweight  INTERVENTION:  TPN management per pharmacy Would not recommend weaning TPN below 50% of needs until pt has consistently demonstrated she can meet her needs orally as she is severely malnourished Continue Boost Breeze po TID, each supplement provides 250 kcal and 9 grams of protein 30 ml ProSource Plus TID, each supplement provides 100 kcals and 15 grams protein.  Ensure Plus High Protein 1x/d to provide 350kcal and 20g of protein  NUTRITION DIAGNOSIS:   Severe Malnutrition related to chronic illness (recurrent c. diff) as evidenced by severe fat depletion, severe muscle depletion.  Ongoing  GOAL:   Patient will meet greater than or equal to 90% of their needs  Goal met via TPN, diet advanced  MONITOR:   Diet advancement, Weight trends, Labs  REASON FOR ASSESSMENT:   Consult Other (Comment) (Assistance with nutritional recommendations for optimization in the setting of appearance of protein calorie malnutrition)  ASSESSMENT:   Pt admitted with dehydration d/t diminished oral intake, found to have ileus. PMH significant for dementia, recurrent C. Diff requiring multiple prior admissions, paroxysmal afib, chronic hyponatremia, anemia of chronic disease.  PICC line placed and TPN initiated on 11/11   Pt resting in bed at the time of assessment. Husband at bedside. Received message from pharmacy 11/15 about possibility of weaning TPN. Discussed intake with pt and family to determine how much was being consumed orally. Reviewed meals from 11/15 with pt and based on intake, pt consumed ~825kcal and 35g of protein with meets ~60% of needs. Pt reports she is trying to eat as much as she can, but is finding she is getting full quickly and her portions are small. Discussed the importance of nutrition supplements in aiding in meeting nutrition needs. Pt reports that she did not  receive any of her boost breezes yesterday but that she really likes them and will consume them and the prosource if they are given to her. Discussed with RN and pharmacy.   Average Meal Intake: 11/11: 75% breakfast, 50% lunch (clear liquid diet)  Nutritionally Relevant Medications: Scheduled Meds:  (feeding supplement) PROSource Plus  30 mL Oral TID BM   feeding supplement  1 Container Oral TID BM   furosemide  40 mg Intravenous Daily   insulin aspart  0-9 Units Subcutaneous Q6H   memantine  5 mg Oral BID   Vitamin D (Ergocalciferol)  50,000 Units Oral Q7 days   Continuous Infusions:  albumin human 25 g (03/14/22 2127)   TPN ADULT (ION) 65 mL/hr at 03/14/22 1811   PRN Meds: ondansetron   Labs Reviewed: Na 127, chloride 94 Creatinine <0.3 Triglycerides 70 CBG ranges from 118-163 mg/dL over the last 24 hours  Diet Order:   Diet Order             DIET SOFT Room service appropriate? Yes; Fluid consistency: Thin  Diet effective now                   EDUCATION NEEDS:  Education needs have been addressed  Skin:  Skin Assessment: Skin Integrity Issues: Skin Integrity Issues:: DTI, Other (Comment) DTI: sacrum Other: non-pressure wound bilateral feet  Last BM:  11/15 - type 6  Height:  Ht Readings from Last 1 Encounters:  03/05/22 _0  (1.575 m)    Weight:  Wt Readings from Last 1 Encounters:  03/15/22 52.3 kg   BMI:  Body mass index is 21.11 kg/m.  Estimated Nutritional Needs:  Kcal:  1300-1500 Protein:  65-80g Fluid:  1.3-1.5L   Ranell Patrick, RD, LDN Clinical Dietitian RD pager # available in [facility]  After hours/weekend pager # available in [facility]

## 2022-03-15 NOTE — Progress Notes (Signed)
PHARMACY - TOTAL PARENTERAL NUTRITION CONSULT NOTE   Indication:  prolonged hospitalization with severe malnutrition/anasarca  Patient Measurements: Height: '5\' 2"'$  (157.5 cm) Weight: 52.3 kg (115 lb 6.4 oz) IBW/kg (Calculated) : 50.1 TPN AdjBW (KG): 41.3 Body mass index is 21.11 kg/m. Usual Weight: 49.5 kg  Assessment:  [name] with malnutrition d/t multiple factors including dementia, recurrent C diff with multiple admissions and GI intolerance, and difficulty chewing with ill-fitting dentures. Pt had poor PO intake prior to admit and reports nut allergy and lactose intolerance. Pharmacy consulted for TPN in setting of malnutrition and inability to meet needs with PO intake.    Glucose / Insulin: A1c 5.4 - CBG 117-163, used 3u/SSI in 24 hours Electrolytes: Na 127, K 5, Mag 1.9, Phos 3,  CoCa 9.9, Cl 94 Renal: Scr <0.30, BUN 15 Hepatic: LFTs / TBili WNL, Trigs 70, albumin 2.1 Intake / Output; MIVF: UOP 0.4 ml/kg/hr; rectal tube in place for Cdiff not charted  GI Imaging: 11/6 CTAP: persistent colonic distention , cannot exclude toxic megacolon 11/9 Ab Xray: decrease in gas distention, improved ileus  11/10 Ab Xray: improvement in gaseous distention 11/12 Ab Xray: no bowel dilation to suggest obstruction/ileus 11/16 Flex sigmoidoscopy: pending  GI Surgeries / Procedures:   Central access: PICC placed 03/10/22 TPN start date: 03/10/2022  Nutritional Goals: Goal TPN rate is 65 mL/hr (provides 70 g of protein and 1464 kcals per day)  RD Assessment: Estimated Needs Total Energy Estimated Needs: 1300-1500 Total Protein Estimated Needs: 65-80g Total Fluid Estimated Needs: 1.3-1.5L  Current Nutrition:  Clear liquids 11/9 Thin/soft diet 11/14 > Boost TID - 3x dose charted Prosource TID - 3x doses charted  11/16: RD reports informal calorie count of ~825 kcal, 35g protein (supplements included)  Plan:  Continue TPN at goal rate 65 mL/hr at 1800 - will provide 100% of needs  (1464 kcal and 70 g protein) -patient has reported nut allergy (peanuts) - low risk of cross reactivity with SMOFLipid ingredient. Upon asking for rxn/details, pt and pt spouse do not remember pt having a nut allergy. Regardless, RN aware and will monitor for any cross-reaction with lipids.    Electrolytes in TPN: incr Na to 90mq/L, K 649m/L, Ca 15m29mL, Mg 10 mEq/L, and Phos 25 mmol/L. Max Cl Add standard MVI and trace elements to TPN Initiate Sensitive q6h SSI and adjust as needed  Monitor TPN labs on Mon/Thurs, and daily until stable F/u diet toleration to determine ability to wean TPN - consider tomorrow with flex sig today  Thank you for involving pharmacy in this patient's care.  JenRenold GentaharmD, BCPS Clinical Pharmacist Clinical phone for 03/15/2022 until 3:15p is [phone] 03/15/2022 7:23 AM

## 2022-03-15 NOTE — Transfer of Care (Signed)
Immediate Anesthesia Transfer of Care Note  Patient: Julie Montgomery  Procedure(s) Performed: FLEXIBLE SIGMOIDOSCOPY BIOPSY  Patient Location: Endoscopy Unit  Anesthesia Type:MAC  Level of Consciousness: awake and alert   Airway & Oxygen Therapy: Patient Spontanous Breathing  Post-op Assessment: Report given to RN and Post -op Vital signs reviewed and stable  Post vital signs: Reviewed and stable  Last Vitals:  Vitals Value Taken Time  BP    Temp    Pulse 82 03/15/22 1218  Resp 21 03/15/22 1218  SpO2 97 % 03/15/22 1218  Vitals shown include unvalidated device data.  Last Pain:  Vitals:   03/15/22 1035  TempSrc: Temporal  PainSc: 9       Patients Stated Pain Goal: 3 (35/78/97 8478)  Complications: No notable events documented.

## 2022-03-16 ENCOUNTER — Inpatient Hospital Stay (HOSPITAL_COMMUNITY): Payer: Medicare Other

## 2022-03-16 DIAGNOSIS — K529 Noninfective gastroenteritis and colitis, unspecified: Secondary | ICD-10-CM | POA: Diagnosis not present

## 2022-03-16 DIAGNOSIS — K567 Ileus, unspecified: Secondary | ICD-10-CM | POA: Diagnosis not present

## 2022-03-16 DIAGNOSIS — E43 Unspecified severe protein-calorie malnutrition: Secondary | ICD-10-CM | POA: Diagnosis not present

## 2022-03-16 LAB — COMPREHENSIVE METABOLIC PANEL
ALT: 21 U/L (ref 0–44)
AST: 21 U/L (ref 15–41)
Albumin: 2.9 g/dL — ABNORMAL LOW (ref 3.5–5.0)
Alkaline Phosphatase: 81 U/L (ref 38–126)
Anion gap: 8 (ref 5–15)
BUN: 16 mg/dL (ref 8–23)
CO2: 28 mmol/L (ref 22–32)
Calcium: 9 mg/dL (ref 8.9–10.3)
Chloride: 95 mmol/L — ABNORMAL LOW (ref 98–111)
Creatinine, Ser: 0.33 mg/dL — ABNORMAL LOW (ref 0.44–1.00)
GFR, Estimated: >60 mL/min (ref >60)
Glucose, Bld: 142 mg/dL — ABNORMAL HIGH (ref 70–99)
Potassium: 4.7 mmol/L (ref 3.5–5.1)
Sodium: 131 mmol/L — ABNORMAL LOW (ref 135–145)
Total Bilirubin: 0.2 mg/dL — ABNORMAL LOW (ref 0.3–1.2)
Total Protein: 5.5 g/dL — ABNORMAL LOW (ref 6.5–8.1)

## 2022-03-16 LAB — CBC WITH DIFFERENTIAL/PLATELET
Abs Immature Granulocytes: 0.12 10*3/uL — ABNORMAL HIGH (ref 0.00–0.07)
Basophils Absolute: 0 10*3/uL (ref 0.0–0.1)
Basophils Relative: 0 %
Eosinophils Absolute: 0.1 10*3/uL (ref 0.0–0.5)
Eosinophils Relative: 1 %
HCT: 26.6 % — ABNORMAL LOW (ref 36.0–46.0)
Hemoglobin: 8.7 g/dL — ABNORMAL LOW (ref 12.0–15.0)
Immature Granulocytes: 1 %
Lymphocytes Relative: 6 %
Lymphs Abs: 0.7 10*3/uL (ref 0.7–4.0)
MCH: 29.5 pg (ref 26.0–34.0)
MCHC: 32.7 g/dL (ref 30.0–36.0)
MCV: 90.2 fL (ref 80.0–100.0)
Monocytes Absolute: 1 10*3/uL (ref 0.1–1.0)
Monocytes Relative: 9 %
Neutro Abs: 9.9 10*3/uL — ABNORMAL HIGH (ref 1.7–7.7)
Neutrophils Relative %: 83 %
Platelets: 192 10*3/uL (ref 150–400)
RBC: 2.95 MIL/uL — ABNORMAL LOW (ref 3.87–5.11)
RDW: 16.1 % — ABNORMAL HIGH (ref 11.5–15.5)
WBC: 11.9 10*3/uL — ABNORMAL HIGH (ref 4.0–10.5)
nRBC: 0 % (ref 0.0–0.2)

## 2022-03-16 LAB — GLUCOSE, CAPILLARY
Glucose-Capillary: 121 mg/dL — ABNORMAL HIGH (ref 70–99)
Glucose-Capillary: 130 mg/dL — ABNORMAL HIGH (ref 70–99)
Glucose-Capillary: 135 mg/dL — ABNORMAL HIGH (ref 70–99)
Glucose-Capillary: 154 mg/dL — ABNORMAL HIGH (ref 70–99)

## 2022-03-16 LAB — PHOSPHORUS: Phosphorus: 3.2 mg/dL (ref 2.5–4.6)

## 2022-03-16 LAB — MAGNESIUM: Magnesium: 2 mg/dL (ref 1.7–2.4)

## 2022-03-16 MED ORDER — METHYLPREDNISOLONE SODIUM SUCC 40 MG IJ SOLR
20.0000 mg | Freq: Every day | INTRAMUSCULAR | Status: DC
  Administered 2022-03-16 – 2022-03-20 (×5): 20 mg via INTRAVENOUS
  Filled 2022-03-16 (×5): qty 1

## 2022-03-16 MED ORDER — TRACE MINERALS CU-MN-SE-ZN 300-55-60-3000 MCG/ML IV SOLN
INTRAVENOUS | Status: AC
  Filled 2022-03-16: qty 464

## 2022-03-16 MED ORDER — FUROSEMIDE 10 MG/ML IJ SOLN
40.0000 mg | Freq: Two times a day (BID) | INTRAMUSCULAR | Status: DC
  Administered 2022-03-16 – 2022-03-18 (×4): 40 mg via INTRAVENOUS
  Filled 2022-03-16 (×4): qty 4

## 2022-03-16 NOTE — Progress Notes (Signed)
ANTICOAGULATION CONSULT NOTE  Pharmacy Consult for enoxaparin Indication: atrial fibrillation  Allergies  Allergen Reactions   Lactose Intolerance (Gi) Other (See Comments)    Intolerance    Lovastatin Other (See Comments)    Unknown reaction   Mometasone Furo-Formoterol Fum Other (See Comments)    Loss of appetite, laryngitis    Peanut-Containing Drug Products Other (See Comments)   Prozac [Fluoxetine Hcl] Other (See Comments)    Jumpy   Sulfa Antibiotics Other (See Comments)    Unknown reaction    Patient Measurements: Height: '5\' 2"'$  (157.5 cm) Weight: 50 kg (110 lb 3.7 oz) IBW/kg (Calculated) : 50.1  Vital Signs: Temp: 97.8 F (36.6 C) (11/17 0843) Temp Source: Oral (11/17 0843) BP: 148/69 (11/17 1027) Pulse Rate: 93 (11/17 1027)  Labs: Recent Labs    03/14/22 0500 03/15/22 0450 03/16/22 0550  HGB 9.4* 9.3* 8.7*  HCT 28.8* 28.4* 26.6*  PLT 200 196 192  CREATININE <0.30* <0.30* 0.33*     Estimated Creatinine Clearance: 41.3 mL/min (A) (by C-G formula based on SCr of 0.33 mg/dL (L)).   Medical History: Past Medical History:  Diagnosis Date   Adenomatous polyp of colon 2020   Allergy    Anxiety    Basal cell carcinoma (BCC)    Cataract    Depression    Helicobacter pylori gastritis    Osteopenia    Paroxysmal atrial fibrillation (HCC)    Pneumonia    hx of 03/2008   Stroke [facility])    TIA long time ago     Assessment: 67 yoF admitted with ileus and poor PO intake. Pt on apixaban at home for hx PAF - last dose 11/7 ~1100. Pharmacy asked to dose enoxaparin while unable to give PO meds reliably.  Scr within normal limits at 0.33. Hemoglobin down slightly form 9.3>8.7. No bleeding issues noted. Will consider checking anti-xa level next week.   Goal of Therapy:  Anti-Xa level 0.6-1 units/ml 4hrs after LMWH dose given Monitor platelets by anticoagulation protocol: Yes   Plan:  Continue enoxaparin '50mg'$  (~'1mg'$ /kg) q12h Monitor CBC, s/sx of  bleeding F/u GI plans, ability to transition to back to [place] PharmD., BCPS Clinical Pharmacist 03/16/2022 11:44 AM

## 2022-03-16 NOTE — Progress Notes (Signed)
PHARMACY - TOTAL PARENTERAL NUTRITION CONSULT NOTE  Indication:  prolonged hospitalization with severe malnutrition/anasarca  Patient Measurements: Height: '5\' 2"'$  (157.5 cm) Weight: 50 kg (110 lb 3.7 oz) IBW/kg (Calculated) : 50.1 TPN AdjBW (KG): 41.3 Body mass index is 20.16 kg/m. Usual Weight: 49.5 kg  Assessment:  [name] with malnutrition d/t multiple factors including dementia, recurrent C diff with multiple admissions and GI intolerance, and difficulty chewing with ill-fitting dentures. Pt had poor PO intake prior to admit and reports nut allergy and lactose intolerance. Pharmacy consulted for TPN in setting of malnutrition and inability to meet needs with PO intake.    Patient has reported nut allergy (peanuts) - low risk of cross reactivity with SMOFlipid ingredient. Upon asking for rxn/details, pt and pt spouse do not remember pt having a nut allergy.  Have been tolerating SMOFlipid.  Glucose / Insulin: A1c 5.4% - CBGs < 180.  Used 3 units SSI in past 24 hrs Electrolytes: Na/CL up to 131/95 with diuresis, others WNL Renal: SCr < 1, BUN WNL Hepatic: LFTs / tbili / TG WNL, albumin 2.9 Intake / Output; MIVF: UOP 1.2 ml/kg/hr with Lasix '40mg'$  IV daily; rectal tube removed with ulceration, net -3L  GI Imaging: 11/6 CTAP: persistent colonic distention , cannot exclude toxic megacolon 11/9 Ab Xray: decrease in gas distention, improved ileus  11/10 Ab Xray: improvement in gaseous distention 11/12 Ab Xray: no bowel dilation to suggest obstruction/ileus 11/16 Flex sigmoidoscopy: colon bx, mod diverticulosis GI Surgeries / Procedures: none since TPN initiation   Central access: PICC placed 03/10/22 TPN start date: 03/10/2022  Nutritional Goals: RD Estimated Needs Total Energy Estimated Needs: 1300-1500 Total Protein Estimated Needs: 65-80g Total Fluid Estimated Needs: 1.3-1.5L  Current Nutrition:  TPN Dysphagia 3 diet on 11/16 - minimal intake on 11/16, patient will try to eat  more 11/17 Boost TID - 2x dose charted Prosource TID - 2x doses charted Ensure Enlive daily - 1 charted given  11/16: RD reports informal calorie count of ~825 kcal, 35g AA  Plan:  Concentrate TPN per discussion with MD  TPN at goal rate 50 mL/hr to provide 100% of needs (1451 kCal and 70g AA) Electrolytes in TPN: Na 153mq/L, K 6536m/L, Ca 36m110mL, Mg 67m736m, Phos 30mm40m, max CL Add standard MVI and trace elements to TPN Continue sensitive SSI Q6H Monitor TPN labs on Mon/Thurs, labs in AM F/u PO intake to determine ability to wean TPN  Dorin Stooksbury D. Ted Goodner,Mina MarblermD, BCPS, BCCCPBridgeport7/2023, 9:32 AM

## 2022-03-16 NOTE — Progress Notes (Signed)
PROGRESS NOTE    Julie Montgomery  HER:[id number] DOB: 1935/11/01 DOA: 03/05/2022 PCP: Cassandria Anger, MD  Chief Complaint  Patient presents with   Fluid Retention    Brief Narrative:  Julie Montgomery is an 86 y.o. female with Arianne Klinge history of dementia, PAF, recurrent C diff, and chronic hyponatremia who presented 11/6 with decreased oral intake. Recent hospitalization from 10/25 to 02/28/22 for recurrent C diff colitis. She was discharge on oral vancomycin taper. At home her diarrhea was improving, taking imodium, though oral intake was very poor. She appeared dehydrated on exam with abdominal tenderness.     Na 129, K 4,3 CL 94 bicarbonate 23, glucose 104 bun 16 cr 0,49  Wbc 13,5 hgb 11,4 plt 356    Abdominal radiograph with marked dilatation of the ascending colon with interval worsening. Mild dilatation of the small bowel loops. Findings consistent with ileus.    CT abdomen and pelvis with persistent and mildly progressive colonic distention with air fluid levels, decreased caliber distal colon at the level of the mid sigmoid colon and increase caliber proximal small bowel loops with the left hemi abdomen. Fecalization of the prominent small bowel loops.    Chest radiograph with hyperinflation and small left pleural effusion, no infiltrates.    She was admitted and treated for recurrent C. diff though work up more suggestive of colonization. ID recommended oral vancomycin with taper. ETEC + in stool, given Judas Mohammad dose of azithromycin. General surgery initially consulted, signed off. GI consulted, recommended TPN, and ultimately signed off having no further recommendations. The patient's abdominal distention has improved, stool output decreased, though she continues to have significant abdominal pain and require rectal tube. Per family request, GI is asked for any additional recommendations they may have.  Assessment & Plan:   Principal Problem:   Ileus ([place]) Active Problems:   Recurrent  Clostridioides difficile infection   Dehydration   Paroxysmal atrial fibrillation (HCC)   Anemia of chronic disease   Protein calorie malnutrition (HCC)   Goals of care, counseling/discussion   C. difficile diarrhea   Protein-calorie malnutrition, severe   Abnormal x-ray of abdomen   Adynamic ileus (HCC)   Assessment and Plan: Ileus: Favored Dx in setting of imodium over toxic megacolon at this time with relatively modest leukocytosis (normalized), nontoxic appearance and exam.  - plain film without evidence of obstruction - holding questran based on distension today, GI considering abdominal CT if no improvement - prn analgesia - Continue diet, can advance as tolerated, continue TPN.  - Surgery signed off.    Diarrhea:  - appreciate GI recommendations, plannning for flex sig -> erythematous and congested mucosa in sigmoid colon, biopsied, awaiting path results - appreciate GI recs - awaiting pathology results - holding questran at this point given abdominal distension - consider non contrast CT tomorrow if no improvement.  GI planning to start steroids given inflammation in colon.  Continuing to consider vowst.   - rectal tube removed, she'd developed wound related to this - will hold off on replacement of tube, frequent wound care with butt cream  Third spacing, hypoalbuminemia  HFpEF Exacerbation  Shortness of Breath:  - gave some albumin severe hypoalbuminemia, hold additional doses given albumin 2.9 today - will increase lasix to BID dosing today given CXR with central pulmonary vessels more prominent suggesting CHF.  Subtle increase in interstitial markings in both lungs suggesting interstitial pulmonary edema.  Small bilateral effusions. - strict I/O (net negative), daily weights (downtrending) - preserved  EF in August.  No protein on UA.  Hyponatremia  - due to volume overload, follow with diuresis  ETEC:  - s/p azithromycin x1 11/10. Supportive care for this for now. No  hematochezia.   Recurrent Clostridioides difficile infection: Repeat work up was negative here, suspect colonization.  - C diff treatment completed.    - Per GI discussion with ID, looking into other causes of chronic diarrhea as noted above - Patient may benefit from outpatient FMT at tertiary care center after discharge.  ID submitted paperwork for approval for Vowst in conjunction with Dr. Hilarie Fredrickson of GI. No response on authorization at this time. This is an outpatient medication.  - Contact isolation   Acute urinary retention, pyuria: No UCx growth. Passed TOV 11/11.    Severe protein calorie malnutrition:  - Continue TPN, started 11/11 thru PICC placed 11/11.    Dehydration: Resolved.  - DC IVF with more edema (hypoalbuminemia and immobility also contributing) and TPN    Hypokalemia:  - Supplementing in TPN.     Hypomagnesemia:  - wnl   Hypophosphatemia:  - wnl   PAF: Has remained in sinus rhythm since admission.  - IV metoprolol prn. - Given her risk factors and hx TIA, restarted anticoagulation. Will continue lovenox for now. - Once taking po reliably, restart home diltiazem, eliquis   Dementia: Appears mild. - Continue namenda.  - Family has been at bedside, I haven't seen them 11/16 or 17.   Anemia of chronic disease: - No evidence of acute bleeding, monitor intermittently.   Vitamin D deficiency:  - Supplement 50k units weekly x8 weeks started 11/8 and recheck    DVT prophylaxis: lovenox Code Status: full Family Communication: none at bedside Disposition:   Status is: Inpatient Remains inpatient appropriate because: pending   Consultants:  GI  Procedures:  none  Antimicrobials:  Anti-infectives (From admission, onward)    Start     Dose/Rate Route Frequency Ordered Stop   03/16/22 0800  vancomycin (VANCOCIN) capsule 125 mg  Status:  Discontinued       See Hyperspace for full Linked Orders Report.   125 mg Oral Daily with breakfast 03/06/22 0220  03/06/22 1240   03/09/22 2200  vancomycin (VANCOCIN) capsule 125 mg       Note to Pharmacy: 02/27/22 - 03/08/22: Take 1 capsule four times daily; 11/10- 11/16: take 1 capsule two times daily; 11/17 - 11/23: take 1 capsule once daily; from 11/24 and on, see other prescrption sent [facility]     125 mg Oral 2 times daily 03/09/22 1635 03/14/22 2126   03/09/22 1730  azithromycin (ZITHROMAX) 1,000 mg in dextrose 5 % 250 mL IVPB        1,000 mg 260 mL/hr over 60 Minutes Intravenous Every 24 hours 03/09/22 1635 03/09/22 2024   03/09/22 0800  vancomycin (VANCOCIN) capsule 125 mg  Status:  Discontinued       See Hyperspace for full Linked Orders Report.   125 mg Oral 2 times daily with meals 03/06/22 0220 03/06/22 1240   03/06/22 1800  vancomycin (VANCOCIN) 500 mg in sodium chloride irrigation 0.9 % 100 mL ENEMA  Status:  Discontinued        500 mg Rectal Every 6 hours 03/06/22 1429 03/06/22 1528   03/06/22 1800  vancomycin (VANCOCIN) capsule 125 mg       Note to Pharmacy: 02/27/22 - 03/08/22: Take 1 capsule four times daily; 11/10- 11/16: take 1 capsule two times daily; 11/17 -  11/23: take 1 capsule once daily; from 11/24 and on, see other prescrption [facility]     125 mg Oral Every 6 hours 03/06/22 1528 03/09/22 1300   03/06/22 1300  metroNIDAZOLE (FLAGYL) IVPB 500 mg  Status:  Discontinued        500 mg 100 mL/hr over 60 Minutes Intravenous Every 8 hours 03/06/22 1249 03/08/22 0838   03/06/22 0800  vancomycin (VANCOCIN) capsule 125 mg  Status:  Discontinued       Note to Pharmacy: 02/27/22 - 03/08/22: Take 1 capsule four times daily; 11/10- 11/16: take 1 capsule two times daily; 11/17 - 11/23: take 1 capsule once daily; from 11/24 and on, see other prescrption [facility]     125 mg Oral 3 times daily before meals & bedtime 03/06/22 0216 03/06/22 1240       Subjective: Asking for her food  Objective: Vitals:   03/16/22 0416 03/16/22 0843 03/16/22 1027 03/16/22 1302  BP:  (!) 112/51 (!) 148/69    Pulse:  92 93   Resp:  (!) 21 (!) 21   Temp:  97.8 F (36.6 C)  98.6 F (37 C)  TempSrc:  Oral  Oral  SpO2:  97% 97%   Weight: 50 kg     Height:        Intake/Output Summary (Last 24 hours) at 03/16/2022 1956 Last data filed at 03/16/2022 1700 Gross per 24 hour  Intake 717 ml  Output 1300 ml  Net -583 ml   Filed Weights   03/14/22 0500 03/15/22 0520 03/16/22 0416  Weight: 52.2 kg 52.3 kg 50 kg    Examination:  General: No acute distress. Cardiovascular: RRR Lungs: unlabored Abdomen: mildly distended and mildly tender Neurological: Alert and oriented 3. Moves all extremities 4 with equal strength. Cranial nerves II through XII grossly intact. Extremities: continued generalized edema  Data Reviewed: I have personally reviewed following labs and imaging studies  CBC: Recent Labs  Lab 03/12/22 0514 03/13/22 0521 03/14/22 0500 03/15/22 0450 03/16/22 0550  WBC 8.3 9.0 11.0* 11.1* 11.9*  NEUTROABS 6.3 6.2 8.5* 8.5* 9.9*  HGB 10.1* 9.9* 9.4* 9.3* 8.7*  HCT 30.7* 29.3* 28.8* 28.4* 26.6*  MCV 90.6 89.9 92.3 92.2 90.2  PLT 235 206 200 196 416    Basic Metabolic Panel: Recent Labs  Lab 03/12/22 1532 03/13/22 0521 03/14/22 0500 03/15/22 0450 03/16/22 0550  NA 129* 131* 129* 127* 131*  K 3.7 3.8 4.3 5.0 4.7  CL 94* 98 94* 94* 95*  CO2 '27 29 29 27 28  '$ GLUCOSE 149* 109* 137* 134* 142*  BUN '9 11 17 15 16  '$ CREATININE 0.32* <0.30* <0.30* <0.30* 0.33*  CALCIUM 8.0* 8.1* 8.1* 8.4* 9.0  MG 1.6* 2.1 1.9 1.9 2.0  PHOS 2.6 3.1 2.9 3.0 3.2    GFR: Estimated Creatinine Clearance: 41.3 mL/min ([place] Maret) (by C-G formula based on SCr of 0.33 mg/dL (L)).  Liver Function Tests: Recent Labs  Lab 03/10/22 0121 03/12/22 0514 03/15/22 0450 03/16/22 0550  AST 18 14* 21 21  ALT '17 13 19 21  '$ ALKPHOS 67 77 93 81  BILITOT <0.1* 0.1* <0.1* 0.2*  PROT 4.4* 4.2* 5.1* 5.5*  ALBUMIN 1.8* 1.6* 2.1* 2.9*    CBG: Recent Labs  Lab 03/15/22 1317 03/15/22 1804 03/16/22 0055  03/16/22 0542 03/16/22 1150  GLUCAP 133* 172* 130* 154* 121*     Recent Results (from the past 240 hour(s))  Urine Culture     Status: None  Collection Time: 03/06/22  9:00 PM   Specimen: Urine, Catheterized  Result Value Ref Range Status   Specimen Description URINE, CATHETERIZED  Final   Special Requests NONE  Final   Culture   Final    NO GROWTH Performed at [facility], [address], [place], [place] [address]    Report Status 03/07/2022 FINAL  Final  Gastrointestinal Panel by PCR , Stool     Status: Abnormal   Collection Time: 03/07/22  4:52 PM   Specimen: Stool  Result Value Ref Range Status   Campylobacter species NOT DETECTED NOT DETECTED Final   Plesimonas shigelloides NOT DETECTED NOT DETECTED Final   Salmonella species NOT DETECTED NOT DETECTED Final   Yersinia enterocolitica NOT DETECTED NOT DETECTED Final   Vibrio species NOT DETECTED NOT DETECTED Final   Vibrio cholerae NOT DETECTED NOT DETECTED Final   Enteroaggregative E coli (EAEC) NOT DETECTED NOT DETECTED Final   Enteropathogenic E coli (EPEC) NOT DETECTED NOT DETECTED Final   Enterotoxigenic E coli (ETEC) DETECTED (Alazae Crymes) NOT DETECTED Final    Comment: RESULT CALLED TO, READ BACK BY AND VERIFIED WITH: [facility] 03/08/22 0944 KLW    Shiga like toxin producing E coli (STEC) NOT DETECTED NOT DETECTED Final   Shigella/Enteroinvasive E coli (EIEC) NOT DETECTED NOT DETECTED Final   Cryptosporidium NOT DETECTED NOT DETECTED Final   Cyclospora cayetanensis NOT DETECTED NOT DETECTED Final   Entamoeba histolytica NOT DETECTED NOT DETECTED Final   Giardia lamblia NOT DETECTED NOT DETECTED Final   Adenovirus F40/41 NOT DETECTED NOT DETECTED Final   Astrovirus NOT DETECTED NOT DETECTED Final   Norovirus GI/GII NOT DETECTED NOT DETECTED Final   Rotavirus Cheri Ayotte NOT DETECTED NOT DETECTED Final   Sapovirus (I, II, IV, and V) NOT DETECTED NOT DETECTED Final    Comment: Performed at [facility], [address] [place], [place] [address]  C Difficile Quick Screen (NO PCR Reflex)     Status: None   Collection Time: 03/09/22  5:44 AM   Specimen: STOOL  Result Value Ref Range Status   C Diff antigen NEGATIVE NEGATIVE Final   C Diff toxin NEGATIVE NEGATIVE Final   C Diff interpretation No C. difficile detected.  Final    Comment: Performed at [facility], [place] [address], [place], [place] [address]         Radiology Studies: DG CHEST PORT 1 VIEW  Result Date: 03/16/2022 CLINICAL DATA:  Shortness of breath EXAM: PORTABLE CHEST 1 VIEW COMPARISON:  03/05/2022 FINDINGS: Transverse diameter of heart is within normal limits. Central pulmonary vessels are more prominent. There is subtle increase in interstitial markings in parahilar regions and right lower lung field. Small bilateral pleural effusions are seen. There is no focal pulmonary consolidation. Tip of right upper extremity PICC line is seen in superior vena cava. IMPRESSION: Central pulmonary vessels are more prominent suggesting CHF. There is subtle increase in interstitial markings in both lungs suggesting interstitial pulmonary edema. Small bilateral pleural effusions. Electronically Signed   By: Elmer Picker M.D.   On: 03/16/2022 18:38   DG Abd 1 View  Result Date: 03/16/2022 CLINICAL DATA:  Abdominal distension EXAM: ABDOMEN - 1 VIEW COMPARISON:  None Available. FINDINGS: No dilated large or small bowel. Gas in the rectum. No organomegaly. No pathologic calcifications. IMPRESSION: No evidence of bowel obstruction. Electronically Signed   By: Suzy Bouchard M.D.   On: 03/16/2022 18:37        Scheduled  Meds:  (feeding supplement) PROSource Plus  30 mL Oral TID BM   Chlorhexidine Gluconate Cloth  6 each Topical Daily   diltiazem  180 mg Oral Daily   enoxaparin (LOVENOX) injection  50 mg Subcutaneous Q12H   feeding supplement  1 Container Oral TID BM   feeding supplement  237 mL Oral Q24H   furosemide  40 mg  Intravenous BID   Gerhardt's butt cream   Topical BID   insulin aspart  0-9 Units Subcutaneous Q6H   lidocaine  1 patch Transdermal Q24H   memantine  5 mg Oral BID   methylPREDNISolone (SOLU-MEDROL) injection  20 mg Intravenous Daily   sodium chloride flush  10-40 mL Intracatheter Q12H   Vitamin D (Ergocalciferol)  50,000 Units Oral Q7 days   Continuous Infusions:  TPN ADULT (ION) 50 mL/hr at 03/16/22 1758     LOS: 10 days    Time spent: over 30 min    Fayrene Helper, MD [facility]   To contact the attending provider between 7A-7P or the covering provider during after hours 7P-7A, please log into the web site [email or web address] and access using universal [place] password for that web site. If you do not have the password, please call the hospital operator.  03/16/2022, 7:56 PM

## 2022-03-16 NOTE — Progress Notes (Signed)
    Progress Note   Assessment    86 year old female with history of recurrent C. difficile, completed C. difficile therapy with ongoing chronic diarrhea, severe malnutrition on TPN, PAF, dementia history   Recommendations   1.  Chronic diarrhea/history of C. Difficile --pathology call me today and the tentative read on the sigmoid biopsies are nonspecific minimal acute colitis with mild architectural disarray.  Not consistent with microscopic colitis or Crohn's disease.  GI pathologist to review on Monday.  Today she is more distended and uncomfortable.  Rectal tube not currently in place.  She did receive a single dose of cholestyramine 4 mg last night.  Lab work; stable ; sodium 131, normal potassium, creatinine 0.33, normal liver enzymes, albumin is slightly improved at this point to 2.9. White count 11.9, hemoglobin 8.7, platelet count 192  -- Replace rectal tube; this will help vent: --Hold Questran at this point -- If no better tomorrow consider noncontrasted CT scan abdomen -- I am going to begin steroids given active inflammation in the colon after documented resolution of infection/C. difficile.  We will have to watch closely with steroids but she is having ongoing severe diarrhea despite adequate and complete C. difficile therapy. --Vowst continues to be an option if we can obtain this out of hospital, we are working on this    Chief Complaint   Pt feels depressed today and tired, no sure "where to go from here" Mild abd pain Not sure is she's had BMs (rectal tube is not in place today) O2 via [place]   Vital signs in last 24 hours: Temp:  [97.7 F (36.5 C)-98.6 F (37 C)] 98.6 F (37 C) (11/17 1302) Pulse Rate:  [82-99] 93 (11/17 1027) Resp:  [15-22] 21 (11/17 1027) BP: (105-149)/(51-84) 148/69 (11/17 1027) SpO2:  [95 %-97 %] 97 % (11/17 1027) Weight:  [50 kg] 50 kg (11/17 0416) Last BM Date : 03/15/22  General: Chronically ill-appearing, mildly dyspneic, though  in no acute distress  Heart:  Regular rate and rhythm; no murmurs Chest: Slight bilateral bibasilar crackles  Abdomen:  Soft, moderately distended today with increased tympany, bowel sounds positive, not overly tender.     Extremities: anasarca   Intake/Output from previous day: 11/16 0701 - 11/17 0700 In: 390 [P.O.:240; I.V.:150] Out: 1200 [Urine:1200] Intake/Output this shift: Total I/O In: -  Out: 800 [Urine:800]  Lab Results: Recent Labs    03/14/22 0500 03/15/22 0450 03/16/22 0550  WBC 11.0* 11.1* 11.9*  HGB 9.4* 9.3* 8.7*  HCT 28.8* 28.4* 26.6*  PLT 200 196 192   BMET Recent Labs    03/14/22 0500 03/15/22 0450 03/16/22 0550  NA 129* 127* 131*  K 4.3 5.0 4.7  CL 94* 94* 95*  CO2 '29 27 28  '$ GLUCOSE 137* 134* 142*  BUN '17 15 16  '$ CREATININE <0.30* <0.30* 0.33*  CALCIUM 8.1* 8.4* 9.0   LFT Recent Labs    03/16/22 0550  PROT 5.5*  ALBUMIN 2.9*  AST 21  ALT 21  ALKPHOS 81  BILITOT 0.2*    Studies/Results: No results found.    LOS: 10 days   Jerene Bears, MD 03/16/2022, 4:45 PM See Shea Evans, [facility], to contact our on call provider

## 2022-03-16 NOTE — Progress Notes (Incomplete)
Patient ID: Julie Montgomery, female   DOB: 1935/07/16, 86 y.o.   MRN: [id number]    Progress Note   Subjective   Day # 11  CC; Diarrhea  WBC 11.9/hemoglobin 8.7/hematocrit 26.6 Sodium 131/potassium 4.7 BUN 16/creatinine 0.33 Albumin 2.9 improved  Biopsies-   Objective   Vital signs in last 24 hours: Temp:  [97.7 F (36.5 C)-98.6 F (37 C)] 98.6 F (37 C) (11/17 1302) Pulse Rate:  [82-99] 93 (11/17 1027) Resp:  [15-22] 21 (11/17 1027) BP: (105-149)/(51-84) 148/69 (11/17 1027) SpO2:  [95 %-97 %] 97 % (11/17 1027) Weight:  [50 kg] 50 kg (11/17 0416) Last BM Date : 03/15/22 General: elderly -   white female in NAD Heart:  Regular rate and rhythm; no murmurs Lungs: Respirations even and unlabored, lungs CTA bilaterally Abdomen:  Soft, nontender and nondistended. Normal bowel sounds. Extremities:  Without edema. Neurologic:  Alert and oriented,  grossly normal neurologically. Psych:  Cooperative. Normal mood and affect.  Intake/Output from previous day: 11/16 0701 - 11/17 0700 In: 390 [P.O.:240; I.V.:150] Out: 1200 [Urine:1200] Intake/Output this shift: Total I/O In: -  Out: 800 [Urine:800]  Lab Results: Recent Labs    03/14/22 0500 03/15/22 0450 03/16/22 0550  WBC 11.0* 11.1* 11.9*  HGB 9.4* 9.3* 8.7*  HCT 28.8* 28.4* 26.6*  PLT 200 196 192   BMET Recent Labs    03/14/22 0500 03/15/22 0450 03/16/22 0550  NA 129* 127* 131*  K 4.3 5.0 4.7  CL 94* 94* 95*  CO2 '29 27 28  '$ GLUCOSE 137* 134* 142*  BUN '17 15 16  '$ CREATININE <0.30* <0.30* 0.33*  CALCIUM 8.1* 8.4* 9.0   LFT Recent Labs    03/16/22 0550  PROT 5.5*  ALBUMIN 2.9*  AST 21  ALT 21  ALKPHOS 81  BILITOT 0.2*   PT/INR No results for input(s): "LABPROT", "INR" in the last 72 hours.  Studies/Results: No results found.     Assessment / Plan:   ***   Discharge Planning Diet:** Anticoagulation and antiplatelets:*** Discharge Medications: *** Follow up: ***  Procedure Procedures  planned and timing of procedure: Lab work ordered: *** Hold the following anticoagulation and/or antiplatelets for procedure? ***      Principal Problem:   Ileus (HCC) Active Problems:   Goals of care, counseling/discussion   Dehydration   Protein calorie malnutrition (HCC)   Paroxysmal atrial fibrillation (HCC)   Recurrent Clostridioides difficile infection   C. difficile diarrhea   Anemia of chronic disease   Protein-calorie malnutrition, severe   Abnormal x-ray of abdomen   Adynamic ileus ([place])     LOS: 10 days   Marit Goodwill EsterwoodPA-C  03/16/2022, 3:01 PM

## 2022-03-16 NOTE — Plan of Care (Signed)
  Problem: Education: Goal: Knowledge of General Education information will improve Description: Including pain rating scale, medication(s)/side effects and non-pharmacologic comfort measures Outcome: Progressing   Problem: Health Behavior/Discharge Planning: Goal: Ability to manage health-related needs will improve Outcome: Progressing   Problem: Clinical Measurements: Goal: Respiratory complications will improve Outcome: Progressing   Problem: Clinical Measurements: Goal: Cardiovascular complication will be avoided Outcome: Progressing   Problem: Coping: Goal: Level of anxiety will decrease Outcome: Progressing   Problem: Nutrition: Goal: Adequate nutrition will be maintained Outcome: Progressing   Problem: Activity: Goal: Risk for activity intolerance will decrease Outcome: Progressing   Problem: Elimination: Goal: Will not experience complications related to bowel motility Outcome: Progressing   Problem: Pain Managment: Goal: General experience of comfort will improve Outcome: Progressing   Problem: Safety: Goal: Ability to remain free from injury will improve Outcome: Progressing   Problem: Skin Integrity: Goal: Risk for impaired skin integrity will decrease Outcome: Progressing

## 2022-03-16 NOTE — Progress Notes (Signed)
Physical Therapy Treatment Patient Details Name: Julie Montgomery MRN: [id number] DOB: 1936/04/04 Today's Date: 03/16/2022   History of Present Illness Pt is an 86 year old woman who came to the ED on11/06 with distended abdomen, LE swelling, poor PO intake with dehydration. She was discharged 4 days prior with c-diff. Likely recurrent c-diff  and colonic ileus. PMH: CHF, dementia, PAF, chronic hyponatremia, anemia, TIA, osteopenia.    PT Comments    Pt very anxious upon PT arrival to room, states she feels like she can't breathe. Pt on 2LO2 upon PT arrival to room, satting 96% and tachypneic in the 20s. Pt removed O2 during session with SpO2 91% and greater, complains less of shortness of breath when working with PT. Pt overall requiring min-mod physical assist for transfer level mobility, limited by fatigue, incontinece, and anxiety. PT to continue to follow.     Recommendations for follow up therapy are one component of a multi-disciplinary discharge planning process, led by the attending physician.  Recommendations may be updated based on patient status, additional functional criteria and insurance authorization.  Follow Up Recommendations  Home health PT (hh aide)     Assistance Recommended at Discharge Frequent or constant Supervision/Assistance  Patient can return home with the following A little help with walking and/or transfers;A little help with bathing/dressing/bathroom;Assistance with cooking/housework;Direct supervision/assist for medications management;Direct supervision/assist for financial management;Assist for transportation;Help with stairs or ramp for entrance;Assistance with feeding   Equipment Recommendations  None recommended by PT    Recommendations for Other Services       Precautions / Restrictions Precautions Precautions: Fall Precaution Comments: incontinent of stool and urine Restrictions Weight Bearing Restrictions: No     Mobility  Bed  Mobility Overal bed mobility: Needs Assistance Bed Mobility: Supine to Sit, Sit to Supine     Supine to sit: Mod assist Sit to supine: Mod assist   General bed mobility comments: assist for LE and trunk management, scooting to/from EOB.    Transfers Overall transfer level: Needs assistance Equipment used: Rolling walker (2 wheels) Transfers: Sit to/from Stand Sit to Stand: Min assist   Step pivot transfers: Min assist       General transfer comment: assist for power up and steadying upon standing. STS x2 from EOB, pt incontinent of urine on second stand. Standing tolerance x2 min, able to take x3 steps forward adn back    Ambulation/Gait               General Gait Details: NT - pt fatigued and very anxious   Stairs             Wheelchair Mobility    Modified Rankin (Stroke Patients Only)       Balance Overall balance assessment: Needs assistance Sitting-balance support: Feet supported Sitting balance-Leahy Scale: Fair     Standing balance support: Bilateral upper extremity supported Standing balance-Leahy Scale: Poor Standing balance comment: Reliant on RW                            Cognition Arousal/Alertness: Awake/alert Behavior During Therapy: Flat affect Overall Cognitive Status: History of cognitive impairments - at baseline Area of Impairment: Memory, Problem solving, Awareness, Following commands, Safety/judgement                       Following Commands: Follows one step commands with increased time     Problem Solving: Slow processing, Difficulty sequencing, Requires  verbal cues General Comments: history of dementia, very anxious and perseverative on back and neck comfort. Pt stating "I can't catch my breath" but VSS        Exercises      General Comments General comments (skin integrity, edema, etc.): Pt on 2LO2 upon PT arrival to room, satting 96% and tachypneic in the 20s      Pertinent Vitals/Pain  Pain Assessment Pain Assessment: Faces Faces Pain Scale: Hurts a little bit Pain Location: back Pain Descriptors / Indicators: Discomfort Pain Intervention(s): Monitored during session, Limited activity within patient's tolerance, Repositioned    Home Living                          Prior Function            PT Goals (current goals can now be found in the care plan section) Acute Rehab PT Goals Patient Stated Goal: to go home PT Goal Formulation: With patient Time For Goal Achievement: 03/23/22 Potential to Achieve Goals: Fair Progress towards PT goals: Progressing toward goals    Frequency    Min 3X/week      PT Plan Current plan remains appropriate    Co-evaluation              AM-PAC PT "6 Clicks" Mobility   Outcome Measure  Help needed turning from your back to your side while in a flat bed without using bedrails?: A Little Help needed moving from lying on your back to sitting on the side of a flat bed without using bedrails?: A Lot Help needed moving to and from a bed to a chair (including a wheelchair)?: A Lot Help needed standing up from a chair using your arms (e.g., wheelchair or bedside chair)?: A Lot Help needed to walk in hospital room?: A Lot Help needed climbing 3-5 steps with a railing? : A Lot 6 Click Score: 13    End of Session   Activity Tolerance: Patient tolerated treatment well Patient left: with call bell/phone within reach;with family/visitor present;with bed alarm set;in bed Nurse Communication: Mobility status PT Visit Diagnosis: Unsteadiness on feet (R26.81);Muscle weakness (generalized) (M62.81);Difficulty in walking, not elsewhere classified (R26.2);Pain     Time: 1620-1650 PT Time Calculation (min) (ACUTE ONLY): 30 min  Charges:  $Therapeutic Activity: 8-22 mins $Self Care/Home Management: 8-22                     Stacie Glaze, PT DPT Acute Rehabilitation Services Pager [phone]  Office [phone]    [place] 03/16/2022, 6:09 PM

## 2022-03-16 NOTE — Plan of Care (Signed)
  Problem: Education: Goal: Knowledge of General Education information will improve Description: Including pain rating scale, medication(s)/side effects and non-pharmacologic comfort measures Outcome: Progressing   Problem: Health Behavior/Discharge Planning: Goal: Ability to manage health-related needs will improve Outcome: Progressing   Problem: Clinical Measurements: Goal: Respiratory complications will improve Outcome: Progressing   Problem: Clinical Measurements: Goal: Cardiovascular complication will be avoided Outcome: Progressing   Problem: Activity: Goal: Risk for activity intolerance will decrease Outcome: Progressing   Problem: Nutrition: Goal: Adequate nutrition will be maintained Outcome: Progressing   Problem: Coping: Goal: Level of anxiety will decrease Outcome: Progressing   Problem: Elimination: Goal: Will not experience complications related to bowel motility Outcome: Progressing   Problem: Pain Managment: Goal: General experience of comfort will improve Outcome: Progressing   Problem: Safety: Goal: Ability to remain free from injury will improve Outcome: Progressing   Problem: Skin Integrity: Goal: Risk for impaired skin integrity will decrease Outcome: Progressing

## 2022-03-16 NOTE — Telephone Encounter (Signed)
Unable to reach pt Husband the last three days in regard to message "Patients's husband called has worries about his wife's upcoming appointment on March 17, 2023" Appointment date has passed.

## 2022-03-17 DIAGNOSIS — I480 Paroxysmal atrial fibrillation: Secondary | ICD-10-CM | POA: Diagnosis not present

## 2022-03-17 DIAGNOSIS — A498 Other bacterial infections of unspecified site: Secondary | ICD-10-CM | POA: Diagnosis not present

## 2022-03-17 DIAGNOSIS — K567 Ileus, unspecified: Secondary | ICD-10-CM | POA: Diagnosis not present

## 2022-03-17 DIAGNOSIS — K5289 Other specified noninfective gastroenteritis and colitis: Secondary | ICD-10-CM | POA: Diagnosis not present

## 2022-03-17 DIAGNOSIS — E860 Dehydration: Secondary | ICD-10-CM | POA: Diagnosis not present

## 2022-03-17 DIAGNOSIS — E43 Unspecified severe protein-calorie malnutrition: Secondary | ICD-10-CM | POA: Diagnosis not present

## 2022-03-17 DIAGNOSIS — E8770 Fluid overload, unspecified: Secondary | ICD-10-CM | POA: Diagnosis not present

## 2022-03-17 LAB — BASIC METABOLIC PANEL
Anion gap: 8 (ref 5–15)
BUN: 20 mg/dL (ref 8–23)
CO2: 29 mmol/L (ref 22–32)
Calcium: 9.5 mg/dL (ref 8.9–10.3)
Chloride: 97 mmol/L — ABNORMAL LOW (ref 98–111)
Creatinine, Ser: <0.3 mg/dL — ABNORMAL LOW (ref 0.44–1.00)
Glucose, Bld: 181 mg/dL — ABNORMAL HIGH (ref 70–99)
Potassium: 4.7 mmol/L (ref 3.5–5.1)
Sodium: 134 mmol/L — ABNORMAL LOW (ref 135–145)

## 2022-03-17 LAB — CBC WITH DIFFERENTIAL/PLATELET
Abs Immature Granulocytes: 0.09 10*3/uL — ABNORMAL HIGH (ref 0.00–0.07)
Basophils Absolute: 0 10*3/uL (ref 0.0–0.1)
Basophils Relative: 0 %
Eosinophils Absolute: 0 10*3/uL (ref 0.0–0.5)
Eosinophils Relative: 0 %
HCT: 27.8 % — ABNORMAL LOW (ref 36.0–46.0)
Hemoglobin: 9.3 g/dL — ABNORMAL LOW (ref 12.0–15.0)
Immature Granulocytes: 1 %
Lymphocytes Relative: 5 %
Lymphs Abs: 0.6 10*3/uL — ABNORMAL LOW (ref 0.7–4.0)
MCH: 30.4 pg (ref 26.0–34.0)
MCHC: 33.5 g/dL (ref 30.0–36.0)
MCV: 90.8 fL (ref 80.0–100.0)
Monocytes Absolute: 0.3 10*3/uL (ref 0.1–1.0)
Monocytes Relative: 3 %
Neutro Abs: 10.6 10*3/uL — ABNORMAL HIGH (ref 1.7–7.7)
Neutrophils Relative %: 91 %
Platelets: 262 10*3/uL (ref 150–400)
RBC: 3.06 MIL/uL — ABNORMAL LOW (ref 3.87–5.11)
RDW: 16.2 % — ABNORMAL HIGH (ref 11.5–15.5)
WBC: 11.6 10*3/uL — ABNORMAL HIGH (ref 4.0–10.5)
nRBC: 0 % (ref 0.0–0.2)

## 2022-03-17 LAB — GLUCOSE, CAPILLARY
Glucose-Capillary: 130 mg/dL — ABNORMAL HIGH (ref 70–99)
Glucose-Capillary: 151 mg/dL — ABNORMAL HIGH (ref 70–99)
Glucose-Capillary: 200 mg/dL — ABNORMAL HIGH (ref 70–99)
Glucose-Capillary: 217 mg/dL — ABNORMAL HIGH (ref 70–99)
Glucose-Capillary: 219 mg/dL — ABNORMAL HIGH (ref 70–99)
Glucose-Capillary: 238 mg/dL — ABNORMAL HIGH (ref 70–99)

## 2022-03-17 LAB — MAGNESIUM: Magnesium: 2.1 mg/dL (ref 1.7–2.4)

## 2022-03-17 MED ORDER — TRACE MINERALS CU-MN-SE-ZN 300-55-60-3000 MCG/ML IV SOLN
INTRAVENOUS | Status: AC
  Filled 2022-03-17: qty 464

## 2022-03-17 MED ORDER — INSULIN ASPART 100 UNIT/ML IJ SOLN
0.0000 [IU] | INTRAMUSCULAR | Status: DC
  Administered 2022-03-17: 3 [IU] via SUBCUTANEOUS
  Administered 2022-03-17 (×2): 5 [IU] via SUBCUTANEOUS
  Administered 2022-03-17: 2 [IU] via SUBCUTANEOUS
  Administered 2022-03-18 (×2): 8 [IU] via SUBCUTANEOUS
  Administered 2022-03-18: 3 [IU] via SUBCUTANEOUS
  Administered 2022-03-18: 8 [IU] via SUBCUTANEOUS

## 2022-03-17 NOTE — Progress Notes (Signed)
[facility] PROGRESS NOTE  CARLISHA WISLER (DOB: 1935/08/22) BDZ:[id number] PCP: Cassandria Anger, MD  Brief Narrative: Julie Montgomery is an 86 y.o. female with a history of dementia, PAF, recurrent C diff, and chronic hyponatremia who presented 11/6 with decreased oral intake. Recent hospitalization from 10/25 to 02/28/22 for recurrent C diff colitis. She was discharge on oral vancomycin taper. At home her diarrhea was improving, taking imodium, though oral intake was very poor. She appeared dehydrated on exam with abdominal tenderness.    Na 129, K 4,3 CL 94 bicarbonate 23, glucose 104 bun 16 cr 0,49  Wbc 13,5 hgb 11,4 plt 356    Abdominal radiograph with marked dilatation of the ascending colon with interval worsening. Mild dilatation of the small bowel loops. Findings consistent with ileus.    CT abdomen and pelvis with persistent and mildly progressive colonic distention with air fluid levels, decreased caliber distal colon at the level of the mid sigmoid colon and increase caliber proximal small bowel loops with the left hemi abdomen. Fecalization of the prominent small bowel loops.    Chest radiograph with hyperinflation and small left pleural effusion, no infiltrates.   She was admitted and treated for recurrent C. diff though work up more suggestive of colonization. ID recommended oral vancomycin with taper. ETEC + in stool, given a dose of azithromycin. General surgery initially consulted, signed off. GI consulted, recommended TPN, and ultimately signed off having no further recommendations. The patient's abdominal distention has improved, stool output decreased, though she continues to have significant abdominal pain and require rectal tube. Per family request, GI is asked for any additional recommendations they may have. Flex sigmoidoscopy performed with biopsy showing nonspecific inflammation for which IV solumedrol started.   Subjective: Abdominal pain a bit better today.  No N/V, taking some po.   Objective: BP (!) 148/76 (BP Location: Left Arm)   Pulse 93   Temp 98.2 F (36.8 C) (Oral)   Resp (!) 23   Ht '5\' 2"'$  (1.575 m)   Wt 48.9 kg   SpO2 93%   BMI 19.72 kg/m   Gen: Elderly, frail female in no distress Pulm: Clear  CV: RRR no MRG. Diffuse pitting edema improved, still 2+ on LE's GI: Soft, distended with less tenderness, no rebound or guarding.  Neuro: Alert and oriented. No new focal deficits. Ext: Warm, no deformities Skin: No rashes, lesions or ulcers on visualized skin   Assessment & Plan: Ileus: Favored Dx in setting of imodium over toxic megacolon at this time with relatively modest leukocytosis (normalized), nontoxic appearance and exam.  - Overall clearly improving: Leukocytosis normalized, remains afebrile. XR consistent with improving exam/distention.  - Continue present management  Diarrhea, nonspecific colitis: Bx at flex sig without evidence of IBD or microscopic colitis at this time.  - Follow final path report with GI path on 11/20.  - Continue IV solumedrol per GI.  - Abd benign/slightly improved, will not pull trigger on CT at this time.  - Keep rectal tube out to minimize skin irritation.  ETEC:  - s/p azithromycin x1 11/10. Supportive care for this for now. No hematochezia.  Recurrent Clostridioides difficile infection: Repeat work up was negative here, suspect colonization.  - Continue PO vancomycin down to BID for tail coverage per ID (final dose 11/15 pm), DC'ed flagyl per ID.    - Patient may benefit from outpatient FMT at tertiary care center after discharge.  ID submitted paperwork for approval for Vowst in conjunction with Dr.  Pyrtle of GI. No response on authorization at this time. This is an outpatient medication.  - Contact isolation  Acute urinary retention, pyuria: No UCx growth. Passed TOV 11/11.   Severe protein calorie malnutrition:  - Continue TPN, started 11/11 thru PICC placed 11/11.   Third spacing,  hypoalbuminemia, acute on chronic HFpEF:  - Continue IV lasix BID. BMP stable, keeping negative balance, still overloaded peripheral > pulmonary..  - Improving nutritional status will assist here as well.   Dehydration: Resolved.  - DC IVF with more edema (hypoalbuminemia and immobility also contributing) and TPN   Hypokalemia:  - Supplementing in TPN.    Hypomagnesemia:  - Repeat supplementation today  Hypophosphatemia:  - Supplement again today, continue intensive monitoring.  PAF: Has remained in sinus rhythm since admission.  - IV metoprolol prn. - Given her risk factors and hx TIA, restarted anticoagulation. Unclear whether GI may pursue flex sig, etc. so keeping on parenteral anticoagulation for now. Will reach back out to them as above. - Once taking po reliably, restart home diltiazem, eliquis  Dementia: Appears mild. - Continue namenda.  - Family has been at bedside consistently.   Anemia of chronic disease: - No evidence of acute bleeding, monitor intermittently.   Vitamin D deficiency:  - Supplement 50k units weekly x8 weeks started 11/8 and recheck  [name], MD [facility] [email or web address] 03/17/2022, 12:17 PM

## 2022-03-17 NOTE — Progress Notes (Signed)
  Brief PMT Progress Note:  Chart reviewed including progress notes, labs, imaging.    Fortunately, there has been some improvement of patient's symptoms today - had worsening abdominal pain, distention, anxiety and depression yesterday.   Noted plan to continue Lasix twice daily and IV steroids for nonspecific colitis.  Patient remains on TPN with goals for improvement and aggressive life-prolonging care.  Will find out more about pathology and outpatient Vowst approval on Monday.  PMT will continue to follow peripherally and engage incrementally for palliative support and ongoing goals of care discussions.  Thank you for your referral and allowing PMT to assist in Julie Montgomery care.   Dorthy Cooler, [facility] Palliative Medicine Team  Team Phone # [phone]   NO CHARGE

## 2022-03-17 NOTE — Progress Notes (Signed)
PHARMACY - TOTAL PARENTERAL NUTRITION CONSULT NOTE  Indication:  prolonged hospitalization with severe malnutrition/anasarca  Patient Measurements: Height: '5\' 2"'$  (157.5 cm) Weight: 48.9 kg (107 lb 12.9 oz) IBW/kg (Calculated) : 50.1 TPN AdjBW (KG): 41.3 Body mass index is 19.72 kg/m. Usual Weight: 49.5 kg  Assessment:  [name] with malnutrition d/t multiple factors including dementia, recurrent C diff with multiple admissions and GI intolerance, and difficulty chewing with ill-fitting dentures. Pt had poor PO intake prior to admit and reports nut allergy and lactose intolerance. Pharmacy consulted for TPN in setting of malnutrition and inability to meet needs with PO intake.    Patient has reported nut allergy (peanuts) - low risk of cross reactivity with SMOFlipid ingredient. Upon asking for rxn/details, pt and pt spouse do not remember pt having a nut allergy.  Have been tolerating SMOFlipid.  Glucose / Insulin: A1c 5.4% - CBGs elevated due to steroid, now in 200s. SM '20mg'$  daily started 11/17 PM for colitis Used 5 units SSI in past 24 hrs  Electrolytes: Na/CL up to 134/97 with diuresis (max Na in TPN), others WNL (CoCa high normal at 10.4) Renal: SCr < 1, BUN WNL Hepatic: LFTs / tbili / TG WNL, albumin 2.9 Intake / Output; MIVF: UOP 1.7 ml/kg/hr with Lasix '40mg'$  IV BID; rectal tube removed with ulceration, net -3.8L  GI Imaging: 11/6 CTAP: persistent colonic distention , cannot exclude toxic megacolon 11/9 Ab Xray: decrease in gas distention, improved ileus  11/10 Ab Xray: improvement in gaseous distention 11/12 Ab Xray: no bowel dilation to suggest obstruction/ileus 11/16 Flex sigmoidoscopy: colon bx, mod diverticulosis 1/17 Abd XR: no bowel obstruction GI Surgeries / Procedures: none since TPN initiation   Central access: PICC placed 03/10/22 TPN start date: 03/10/2022  Nutritional Goals: RD Estimated Needs Total Energy Estimated Needs: 1300-1500 Total Protein Estimated  Needs: 65-80g Total Fluid Estimated Needs: 1.3-1.5L  Current Nutrition:  TPN Dysphagia 3 diet on 11/16 - ate 45% of breakfast only Boost TID - 2x dose charted Prosource TID - 2x doses charted Ensure Enlive daily - 1 charted given  11/16: RD reports informal calorie count of ~825 kcal, 35g AA  Plan:  Continue concentrated TPN at goal rate 50 mL/hr to provide 100% of needs (1451 kCal and 70g AA) Electrolytes in TPN: Na 128mq/L, K 650m/L, reduce Ca to 21m19mL, Mg 121m14m, Phos 30mm5m, max CL Add standard MVI and trace elements to TPN Increase SSI to moderate Q4H  Monitor TPN labs on Mon/Thurs, labs in AM with diuresis F/u PO intake to determine ability to wean TPN  Izabelle Daus D. Remberto Lienhard,Mina MarblermD, BCPS, BCCCPQulin8/2023, 9:47 AM

## 2022-03-17 NOTE — Progress Notes (Signed)
Patient ID: Julie Montgomery, female   DOB: August 16, 1935, 86 y.o.   MRN: [id number]    Progress Note   Subjective   Day # 12  CC; multiple recurrences of C. difficile, completed course of vancomycin during this admission, severe ongoing chronic diarrhea, severe malnutrition  TPN IV Lasix 40 twice daily IV Solu-Medrol 20 mg daily  Colon biopsies showed nonspecific minimal acute colitis not consistent with microscopic colitis or Crohn's  Chest x-ray 03/16/2022-central pulmonary vessels are more prominent suggesting congestive heart failure subtle increase in interstitial markings in both lungs suggestive of interstitial pulmonary edema small bilateral effusion  WBC 11.6/hemoglobin 9.3/hematocrit 27.8 Sodium 130/potassium 4.7 Creatinine 0.3 Magnesium 2.1  She is looking much better this morning, says her outlook is better, she trying to eat breakfast, son at bedside, denies nausea, feels a little short of breath but improved over yesterday. Abdomen "sore" nursing reports she did have 1 diarrheal stool earlier this morning, none during the night Rectal  tube been not replaced by nursing due to concerns for anal rectal irritation    Objective   Vital signs in last 24 hours: Temp:  [97.8 F (36.6 C)-98.6 F (37 C)] 97.8 F (36.6 C) (11/18 0431) Pulse Rate:  [75-93] 75 (11/18 0431) Resp:  [20-21] 20 (11/18 0431) BP: (136-148)/(65-78) 138/65 (11/18 0431) SpO2:  [94 %-97 %] 94 % (11/18 0431) Weight:  [48.9 kg] 48.9 kg (11/18 0446) Last BM Date : 03/17/22 General:   Elderly white female in NAD, very frail-appearing Heart:  Regular rate and rhythm; no murmurs Lungs: Respirations even and unlabored, few faint Rales bases bilaterally Abdomen:  Soft, mildly distended nontense bowel sounds are active, mild rather generalized tenderness no rebound  EXt; decreased edema in the upper and lower extremities Neurologic:  Alert and oriented,  grossly normal neurologically. Psych:  Cooperative.  Normal mood and affect.  Intake/Output from previous day: 11/17 0701 - 11/18 0700 In: 1146.6 [P.O.:597; I.V.:549.6] Out: 2050 [Urine:2050] Intake/Output this shift: No intake/output data recorded.  Lab Results: Recent Labs    03/15/22 0450 03/16/22 0550 03/17/22 0535  WBC 11.1* 11.9* 11.6*  HGB 9.3* 8.7* 9.3*  HCT 28.4* 26.6* 27.8*  PLT 196 192 262   BMET Recent Labs    03/15/22 0450 03/16/22 0550 03/17/22 0535  NA 127* 131* 134*  K 5.0 4.7 4.7  CL 94* 95* 97*  CO2 '27 28 29  '$ GLUCOSE 134* 142* 181*  BUN '15 16 20  '$ CREATININE <0.30* 0.33* <0.30*  CALCIUM 8.4* 9.0 9.5   LFT Recent Labs    03/16/22 0550  PROT 5.5*  ALBUMIN 2.9*  AST 21  ALT 21  ALKPHOS 81  BILITOT 0.2*   PT/INR No results for input(s): "LABPROT", "INR" in the last 72 hours.  Studies/Results: DG CHEST PORT 1 VIEW  Result Date: 03/16/2022 CLINICAL DATA:  Shortness of breath EXAM: PORTABLE CHEST 1 VIEW COMPARISON:  03/05/2022 FINDINGS: Transverse diameter of heart is within normal limits. Central pulmonary vessels are more prominent. There is subtle increase in interstitial markings in parahilar regions and right lower lung field. Small bilateral pleural effusions are seen. There is no focal pulmonary consolidation. Tip of right upper extremity PICC line is seen in superior vena cava. IMPRESSION: Central pulmonary vessels are more prominent suggesting CHF. There is subtle increase in interstitial markings in both lungs suggesting interstitial pulmonary edema. Small bilateral pleural effusions. Electronically Signed   By: Elmer Picker M.D.   On: 03/16/2022 18:38   DG Abd 1  View  Result Date: 03/16/2022 CLINICAL DATA:  Abdominal distension EXAM: ABDOMEN - 1 VIEW COMPARISON:  None Available. FINDINGS: No dilated large or small bowel. Gas in the rectum. No organomegaly. No pathologic calcifications. IMPRESSION: No evidence of bowel obstruction. Electronically Signed   By: Suzy Bouchard M.D.    On: 03/16/2022 18:37       Assessment / Plan:    #72 86 year old white female with prolonged illness with relapsing/recurrent C. difficile.  This admission C. difficile testing was negative, she completed the initial course of vancomycin which had been tapered to twice daily dosing. Unfortunately she continues to have chronic significant diarrhea Colonoscopy with finding of a mild colitis with pseudomembranes-biopsies showed nonspecific minimal acute colitis not consistent with Crohn's or microscopic colitis  She was more distended yesterday so Questran was discontinued, Rectal tube was supposed to be replaced but not done She is having bowel movements and is not significantly distended today so we will leave Flexi-Seal/rectal tube out  Have started low-dose Solu-Medrol for the nonspecific colitis, clinically she looks better today  Still may benefit from treating her with Vowst  #2 volume overload/likely secondary to malnutrition/hypoalbuminemia-improving with IV Lasix #3 ileus-resolved, KUB yesterday did not show any evidence of ileus  #4 severe calorie protein malnutrition, continue TPN #5 debilitation secondary to prolonged illness # 6 mild dementia #7 anemia stable  Plan; continue IV Solu-Medrol Continue TPN Continue twice daily Lasix Okay to the Flexi-Seal/rectal tube out We can coordinate Vowst to be given while she is in the hospital early next week-patient will need to be brought from the outside pharmacy to the hospital.    Principal Problem:   Ileus ([place]) Active Problems:   Goals of care, counseling/discussion   Dehydration   Protein calorie malnutrition ([place])   Paroxysmal atrial fibrillation ([facility])   Recurrent Clostridioides difficile infection   C. difficile diarrhea   Anemia of chronic disease   Protein-calorie malnutrition, severe   Abnormal x-ray of abdomen   Adynamic ileus ([place])     LOS: 11 days   Adelaide Pfefferkorn PA-C 03/17/2022, 8:56 AM

## 2022-03-18 ENCOUNTER — Encounter (HOSPITAL_COMMUNITY): Payer: Self-pay | Admitting: Internal Medicine

## 2022-03-18 DIAGNOSIS — E8770 Fluid overload, unspecified: Secondary | ICD-10-CM

## 2022-03-18 DIAGNOSIS — K567 Ileus, unspecified: Secondary | ICD-10-CM | POA: Diagnosis not present

## 2022-03-18 DIAGNOSIS — E43 Unspecified severe protein-calorie malnutrition: Secondary | ICD-10-CM | POA: Diagnosis not present

## 2022-03-18 DIAGNOSIS — E860 Dehydration: Secondary | ICD-10-CM | POA: Diagnosis not present

## 2022-03-18 DIAGNOSIS — K529 Noninfective gastroenteritis and colitis, unspecified: Secondary | ICD-10-CM | POA: Diagnosis not present

## 2022-03-18 DIAGNOSIS — K5289 Other specified noninfective gastroenteritis and colitis: Secondary | ICD-10-CM

## 2022-03-18 DIAGNOSIS — I480 Paroxysmal atrial fibrillation: Secondary | ICD-10-CM | POA: Diagnosis not present

## 2022-03-18 DIAGNOSIS — A498 Other bacterial infections of unspecified site: Secondary | ICD-10-CM | POA: Diagnosis not present

## 2022-03-18 LAB — BASIC METABOLIC PANEL
Anion gap: 10 (ref 5–15)
BUN: 30 mg/dL — ABNORMAL HIGH (ref 8–23)
CO2: 29 mmol/L (ref 22–32)
Calcium: 9.3 mg/dL (ref 8.9–10.3)
Chloride: 98 mmol/L (ref 98–111)
Creatinine, Ser: 0.39 mg/dL — ABNORMAL LOW (ref 0.44–1.00)
GFR, Estimated: >60 mL/min (ref >60)
Glucose, Bld: 134 mg/dL — ABNORMAL HIGH (ref 70–99)
Potassium: 4.1 mmol/L (ref 3.5–5.1)
Sodium: 137 mmol/L (ref 135–145)

## 2022-03-18 LAB — CBC WITH DIFFERENTIAL/PLATELET
Abs Immature Granulocytes: 0.16 10*3/uL — ABNORMAL HIGH (ref 0.00–0.07)
Basophils Absolute: 0 10*3/uL (ref 0.0–0.1)
Basophils Relative: 0 %
Eosinophils Absolute: 0 10*3/uL (ref 0.0–0.5)
Eosinophils Relative: 0 %
HCT: 27 % — ABNORMAL LOW (ref 36.0–46.0)
Hemoglobin: 9.1 g/dL — ABNORMAL LOW (ref 12.0–15.0)
Immature Granulocytes: 1 %
Lymphocytes Relative: 6 %
Lymphs Abs: 0.9 10*3/uL (ref 0.7–4.0)
MCH: 30.7 pg (ref 26.0–34.0)
MCHC: 33.7 g/dL (ref 30.0–36.0)
MCV: 91.2 fL (ref 80.0–100.0)
Monocytes Absolute: 1.3 10*3/uL — ABNORMAL HIGH (ref 0.1–1.0)
Monocytes Relative: 8 %
Neutro Abs: 14.3 10*3/uL — ABNORMAL HIGH (ref 1.7–7.7)
Neutrophils Relative %: 85 %
Platelets: 334 10*3/uL (ref 150–400)
RBC: 2.96 MIL/uL — ABNORMAL LOW (ref 3.87–5.11)
RDW: 16.4 % — ABNORMAL HIGH (ref 11.5–15.5)
WBC: 16.7 10*3/uL — ABNORMAL HIGH (ref 4.0–10.5)
nRBC: 0 % (ref 0.0–0.2)

## 2022-03-18 LAB — GLUCOSE, CAPILLARY
Glucose-Capillary: 102 mg/dL — ABNORMAL HIGH (ref 70–99)
Glucose-Capillary: 116 mg/dL — ABNORMAL HIGH (ref 70–99)
Glucose-Capillary: 253 mg/dL — ABNORMAL HIGH (ref 70–99)
Glucose-Capillary: 283 mg/dL — ABNORMAL HIGH (ref 70–99)
Glucose-Capillary: 295 mg/dL — ABNORMAL HIGH (ref 70–99)
Glucose-Capillary: 95 mg/dL (ref 70–99)

## 2022-03-18 MED ORDER — APIXABAN 2.5 MG PO TABS
2.5000 mg | ORAL_TABLET | Freq: Two times a day (BID) | ORAL | Status: DC
  Administered 2022-03-18 – 2022-03-20 (×5): 2.5 mg via ORAL
  Filled 2022-03-18 (×5): qty 1

## 2022-03-18 MED ORDER — TRACE MINERALS CU-MN-SE-ZN 300-55-60-3000 MCG/ML IV SOLN
INTRAVENOUS | Status: AC
  Filled 2022-03-18: qty 464

## 2022-03-18 MED ORDER — FUROSEMIDE 10 MG/ML IJ SOLN
40.0000 mg | Freq: Once | INTRAMUSCULAR | Status: AC
  Administered 2022-03-18: 40 mg via INTRAVENOUS
  Filled 2022-03-18: qty 4

## 2022-03-18 MED ORDER — DILTIAZEM HCL ER COATED BEADS 240 MG PO CP24
240.0000 mg | ORAL_CAPSULE | Freq: Every day | ORAL | Status: DC
  Administered 2022-03-18 – 2022-03-20 (×3): 240 mg via ORAL
  Filled 2022-03-18 (×4): qty 1

## 2022-03-18 MED ORDER — METOPROLOL TARTRATE 5 MG/5ML IV SOLN
5.0000 mg | Freq: Once | INTRAVENOUS | Status: AC
  Administered 2022-03-18: 5 mg via INTRAVENOUS

## 2022-03-18 MED ORDER — MORPHINE SULFATE (PF) 2 MG/ML IV SOLN
1.0000 mg | Freq: Once | INTRAVENOUS | Status: AC
  Administered 2022-03-18: 1 mg via INTRAVENOUS
  Filled 2022-03-18: qty 1

## 2022-03-18 NOTE — Progress Notes (Signed)
Daily Progress Note   Patient Name: Julie Montgomery       Date: 03/18/2022 DOB: 1936/04/12  Age: 86 y.o. MRN#: [id number] Attending Physician: [name], MD Primary Care Physician: Cassandria Anger, MD Admit Date: 03/05/2022  Reason for Consultation/Follow-up: Establishing goals of care  Subjective: Medical records reviewed including progress notes, labs, imaging. Patient assessed at the bedside.  She reports feeling frustrated with her bed, stating it is too loud and that she will not stay in it, "or in this room."  Her husband is present at the bedside.  He states patient's physicians have been made aware.  He also states she has been making comments that she was moved to another room.  Reassurance provided.  We discussed possibility of worsening confusion the longer that patient is admitted to the hospital.  She is also frustrated about waiting to be cleaned up.  Discussed with RN, awaiting Flexi-Seal for reinsertion.  Created space and opportunity for patient and family's thoughts and feelings on her current illness.  I asked patient if she was feeling up to having a potentially difficult conversation today and she was agreeable.  Provided reassurance that I do not believe she is currently at end-of-life, but that these conversations are still important as she may feel differently than when we initially met. I encouraged patient to consider her experiences during the past couple of weeks and her overall quality of life when evaluating her current care preferences. We discussed the importance of continued conversation with family and the medical providers regarding overall plan of care and treatment options, ensuring decisions are within the context of the patient's values and GOCs.    Discussed patient's A-fib with RVR last night, explaining that while she did respond well to treatment, this can sometimes lead to worsening arrhythmias that can be difficult to manage.  Provided education and counseling on her acute heart failure exacerbation.  I asked if her heart were to stop, would she still want to be a full code.  Patient is very clear that she would want CPR and anything else available to keep her alive.  She states several times "I want to live, I want you to do anything you need to do."  Explored husband's thoughts on this and he states he is going to support Alonzo's wishes.  Informed patient and family that this PA will not be here for the next week.  Discussed their thoughts on PMT involvement in the meantime.  They are agreeable to PMT signing off at this time.  Encouraged to call PMT or request another consult should further palliative needs arise.  They remain interested in outpatient palliative care at discharge.  Questions and concerns addressed. PMT will continue to support holistically.   Length of Stay: 12  Physical Exam Vitals and nursing note reviewed.  Constitutional:      General: She is not in acute distress.    Appearance: She is cachectic. She is ill-appearing.  Cardiovascular:     Rate and Rhythm: Normal rate.  Pulmonary:     Effort: Pulmonary effort is normal.  Skin:    General: Skin is warm and dry.  Neurological:     Mental Status: She is alert. Mental status is at baseline.  Psychiatric:        Behavior: Behavior is cooperative.   Vital Signs: BP 130/70 (BP Location: Left Arm)   Pulse 79   Temp 97.9 F (36.6 C) (Oral)   Resp 20   Ht 5' 2" (1.575 m)   Wt 49.4 kg   SpO2 95%   BMI 19.94 kg/m  SpO2: SpO2: 95 % O2 Device: O2 Device: Nasal Cannula O2 Flow Rate: O2 Flow Rate (L/min): 2 L/min      Palliative Assessment/Data: 40% at best   Palliative Care Assessment & Plan   Patient Profile: 85 y.o. female  with past medical  history of  dementia, recurrent C. difficile, paroxysmal atrial fibrillation chronically anticoagulated on Eliquis, chronic hyponatremia, anemia of chronic disease associated baseline hemoglobin range 11-13, admitted on 03/05/2022 with poor oral intake and dehydration, diarrhea.    Patient has been hospitalized several times for recurrent C. difficile infections (9/14-9/16, 10/10-10/16, 10/26-11/1).  She is currently admitted again for AKI, hypercalcemia, ongoing management of C. difficile infection.  In the ED, CT of the abdomen and pelvis showed mildly progressive colonic distention with air-fluid levels and diminished bowel transit concerning for toxic megacolon, no signs of pneumatosis or bowel perforation.  Patient is a poor candidate for endoscopic or surgical intervention.   PMT has been consulted to assist with goals of care conversation.  Assessment: Goals of care conversation Recurrent C. difficile Colonic ileus  Recommendations/Plan: Continue full code/full scope treatment  Patient is determined to live as long as possible and open to all life-prolonging interventions available Patient will benefit from outpatient palliative care referral when medically stable for discharge Psychosocial and emotional support provided PMT will sign off at this time with patient and husband in agreement. Please re-consult should urgent needs arise   Prognosis:  Unable to determine  Discharge Planning: To Be Determined  Care plan was discussed with patient, patient's husband, RN   MDM high          P , PA-C  Palliative Medicine Team Team phone # [phone]  Thank you for allowing the Palliative Medicine Team to assist in the care of this patient. Please utilize secure chat with additional questions, if there is no response within 30 minutes please call the above phone number.  Palliative Medicine Team providers are available by phone from 7am to 7pm daily and can be  reached through the team cell phone.  Should this patient require assistance outside of these hours, please call the patient's attending physician.   

## 2022-03-18 NOTE — Progress Notes (Signed)
NUTRITION NOTE RD working remotely.  RD following. Consult entered this AM for Calorie Count. Entered order for Calorie Count with instructions for nursing staff.  Patient is ordered Dysphagia 3, thin liquids diet. RD will follow-up 03/19/22 with day #1 results.     Jarome Matin, MS, RD, LDN, CNSC Clinical Dietitian PRN/Relief staff On-call/weekend pager # available in [facility]

## 2022-03-18 NOTE — Progress Notes (Signed)
[facility] PROGRESS NOTE  GUDELIA EUGENE (DOB: 1936-04-30) OZD:[id number] PCP: Cassandria Anger, MD  Brief Narrative: Julie Montgomery is an 86 y.o. female with a history of dementia, PAF, recurrent C diff, and chronic hyponatremia who presented 11/6 with decreased oral intake. Recent hospitalization from 10/25 to 02/28/22 for recurrent C diff colitis. She was discharge on oral vancomycin taper. At home her diarrhea was improving, taking imodium, though oral intake was very poor. She appeared dehydrated on exam with abdominal tenderness.    Na 129, K 4,3 CL 94 bicarbonate 23, glucose 104 bun 16 cr 0,49  Wbc 13,5 hgb 11,4 plt 356    Abdominal radiograph with marked dilatation of the ascending colon with interval worsening. Mild dilatation of the small bowel loops. Findings consistent with ileus.    CT abdomen and pelvis with persistent and mildly progressive colonic distention with air fluid levels, decreased caliber distal colon at the level of the mid sigmoid colon and increase caliber proximal small bowel loops with the left hemi abdomen. Fecalization of the prominent small bowel loops.    Chest radiograph with hyperinflation and small left pleural effusion, no infiltrates.   She was admitted and treated for recurrent C. diff though work up more suggestive of colonization. ID recommended oral vancomycin with taper. ETEC + in stool, given a dose of azithromycin. General surgery initially consulted, signed off. GI consulted, recommended TPN, and ultimately signed off having no further recommendations. The patient's abdominal distention has improved, stool output decreased, though she continues to have significant abdominal pain and require rectal tube. Per family request, GI is asked for any additional recommendations they may have. Flex sigmoidoscopy performed with biopsy showing nonspecific inflammation for which IV solumedrol started.   Subjective: Had rapid atrial fibrillation last  night which has converted again to sinus rhythm. She's irritated by frequently needing to get clean up and bed making noises. Seems like she may have gotten disoriented overnight.  Objective: BP 123/60 (BP Location: Left Arm)   Pulse 83   Temp 97.8 F (36.6 C) (Oral)   Resp 20   Ht '5\' 2"'$  (1.575 m)   Wt 49.4 kg   SpO2 96%   BMI 19.94 kg/m   Gen: Thin, frail female in no acute distress Pulm: Clear, nonlabored, diminished throughout  CV: RRR, no MRG, edema in extremities is essentially gone GI: Soft, diffusely tender and distended, no rigidity. Neuro: Alert and oriented. No new focal deficits. Ext: Warm, no deformities Skin: No rashes, lesions or ulcers on visualized skin   Assessment & Plan: Ileus: Favored Dx in setting of imodium over toxic megacolon at this time with relatively modest leukocytosis (normalized), nontoxic appearance and exam.  - Overall clearly improved - Continue present management  Diarrhea, nonspecific colitis: Bx at flex sig without evidence of IBD or microscopic colitis at this time.  - Follow final path report with GI path on 11/20.  - Continue IV solumedrol per GI (suspected cause of rising leukocytosis at this time) - Abd benign/slightly improved - Reinsert rectal tube.  ETEC:  - s/p azithromycin x1 11/10. Supportive care for this for now. No hematochezia.  Recurrent Clostridioides difficile infection: Repeat work up was negative here, suspect colonization.  - Completed vancomycin with taper per ID (final dose 11/15 pm), DC'ed flagyl per ID. No longer requires isolation. - Patient may benefit from outpatient FMT at tertiary care center after discharge.  ID submitted paperwork for approval for Vowst in conjunction with Dr. Hilarie Fredrickson of GI.  No response on authorization at this time. This is an outpatient medication.   Acute urinary retention, pyuria: No UCx growth. Passed TOV 11/11.  Severe protein calorie malnutrition:  - Continue TPN, started 11/11 thru  PICC placed 11/11.   Third spacing, hypoalbuminemia, acute on chronic HFpEF:  - We will hold further IV lasix after this AM's dose. Creatinine (and BUN:Cr) beginning to rise. Clinically swelling is very much improved. Keeping negative balance. - Improving nutritional status will assist here as well.   Dehydration: Resolved.  - DC IVF with more edema (hypoalbuminemia and immobility also contributing) and TPN   Hypokalemia:  - Supplementing in TPN.    Hypomagnesemia:  - Repeat supplementation today  Hypophosphatemia:  - Supplement again today, continue intensive monitoring.  PAF: Had run of RVR overnight 11/18 - 11/19 - Continue IV metoprolol prn. - Given her risk factors and hx TIA, restarted anticoagulation.  - Increase home diltiazem dose. BP is stable/elevated. - Continue cardiac monitoring.   Dementia: Appears mild. - Continue namenda.  - Family has been at bedside consistently.   Anemia of chronic disease: - No evidence of acute bleeding, monitor intermittently.   Vitamin D deficiency:  - Supplement 50k units weekly x8 weeks started 11/8 and recheck  [name], MD [facility] [email or web address] 03/18/2022, 3:24 PM

## 2022-03-18 NOTE — Progress Notes (Signed)
PHARMACY - TOTAL PARENTERAL NUTRITION CONSULT NOTE  Indication:  prolonged hospitalization with severe malnutrition/anasarca  Patient Measurements: Height: '5\' 2"'$  (157.5 cm) Weight: 49.4 kg (109 lb) IBW/kg (Calculated) : 50.1 TPN AdjBW (KG): 41.3 Body mass index is 19.94 kg/m. Usual Weight: 49.5 kg  Assessment:  [name] with malnutrition d/t multiple factors including dementia, recurrent C diff with multiple admissions and GI intolerance, and difficulty chewing with ill-fitting dentures. Pt had poor PO intake prior to admit and reports nut allergy and lactose intolerance. Pharmacy consulted for TPN in setting of malnutrition and inability to meet needs with PO intake.    Patient has reported nut allergy (peanuts) - low risk of cross reactivity with SMOFlipid ingredient. Upon asking for rxn/details, pt and pt spouse do not remember pt having a nut allergy.  Have been tolerating SMOFlipid.  Glucose / Insulin: A1c 5.4% - CBGs elevated due to steroid 11/18, now variable and correlating with PO intake SM '20mg'$  daily started 11/17 PM for colitis Used 20 units SSI in past 24 hrs  Electrolytes: all WNL (Na/CL normalized with max Na/CL in TPN and diuresis) Renal: SCr < 1, BUN WNL Hepatic: LFTs / tbili / TG WNL, albumin 2.9 Intake / Output; MIVF: UOP 1.5 ml/kg/hr with Lasix '40mg'$  IV BID with extra dose on 11/19 AM; rectal tube removed with ulceration, net -4.4L, LBM 11/18  GI Imaging: 11/6 CTAP: persistent colonic distention , cannot exclude toxic megacolon 11/9 Ab Xray: decrease in gas distention, improved ileus  11/10 Ab Xray: improvement in gaseous distention 11/12 Ab Xray: no bowel dilation to suggest obstruction/ileus 11/16 Flex sigmoidoscopy: colon bx, mod diverticulosis 1/17 Abd XR: no bowel obstruction GI Surgeries / Procedures: none since TPN initiation   Central access: PICC placed 03/10/22 TPN start date: 03/10/2022  Nutritional Goals: RD Estimated Needs Total Energy Estimated  Needs: 1300-1500 Total Protein Estimated Needs: 65-80g Total Fluid Estimated Needs: 1.3-1.5L  Current Nutrition:  TPN Dysphagia 3 diet on 11/16 - ate ~25% of meals Boost TID - 3x dose charted Prosource TID - 2x doses charted Ensure Enlive daily - 1 charted given  11/16: RD reports informal calorie count of ~825 kcal, 35g AA  Plan:  Continue concentrated TPN at goal rate 50 mL/hr to provide 100% of needs (1451 kCal and 70g AA) Electrolytes in TPN: Na 142mq/L, K 64m/L (once off diuretic, likely need reduction), reduce Ca to 61m24mL on 11/18, Mg 161m10m, Phos 30mm48m, max CL Add standard MVI and trace elements to TPN Continue moderate SSI Q4H - consider scheduling meal coverage Monitor TPN labs on Mon/Thurs RD consulted for formal calorie count - f/u PO intake to determine ability to wean TPN  Julie Montgomery,Julie MarblermD, BCPS, BCCCPCedar Point9/2023, 1:33 PM

## 2022-03-18 NOTE — Progress Notes (Signed)
[place] for Eliquis Indication: atrial fibrillation  Allergies  Allergen Reactions   Lactose Intolerance (Gi) Other (See Comments)    Intolerance    Lovastatin Other (See Comments)    Unknown reaction   Mometasone Furo-Formoterol Fum Other (See Comments)    Loss of appetite, laryngitis    Peanut-Containing Drug Products Other (See Comments)   Prozac [Fluoxetine Hcl] Other (See Comments)    Jumpy   Sulfa Antibiotics Other (See Comments)    Unknown reaction    Patient Measurements: Height: '5\' 2"'$  (157.5 cm) Weight: 49.4 kg (109 lb) IBW/kg (Calculated) : 50.1  Vital Signs: Temp: 98 F (36.7 C) (11/19 0414) Temp Source: Oral (11/19 0414) BP: 120/64 (11/19 0414) Pulse Rate: 67 (11/19 0414)  Labs: Recent Labs    03/16/22 0550 03/17/22 0535 03/18/22 0250  HGB 8.7* 9.3* 9.1*  HCT 26.6* 27.8* 27.0*  PLT 192 262 334  CREATININE 0.33* <0.30* 0.39*     Estimated Creatinine Clearance: 40.8 mL/min (A) (by C-G formula based on SCr of 0.39 mg/dL (L)).   Assessment: 86 yo female admitted with ileus, now able to restart oral medications, to resume Eliquis.  Received Lovenox 50 mg this morning    Plan:  Start Eliquis 2.5 mg BID this evening  Phillis Knack, PharmD, BCPS  03/18/2022 6:46 AM

## 2022-03-18 NOTE — Progress Notes (Signed)
Noted pt heart rate intermittently increasing to 140s while lying in bed and returning to 80s-90s.  Appeared to be irregular with pvc.  K and Mg nl 11/18.  BP 138/107, pt reports she feels hot and trying to catch her breath.  Temp nl and tachypnea appreciated.  EKG showed Afib RVR.  Dr. Sidney Ace notified of rhythm change and RED mews.  Orders for IV metoprolol 5 mg received and administered to pt.  Heart rate 106 currently with PVCs.  Will continue to monitor pt closely

## 2022-03-18 NOTE — Progress Notes (Signed)
Pt now Sinus Rhythm 74.

## 2022-03-18 NOTE — Progress Notes (Signed)
   03/18/22 0041  Assess: MEWS Score  Temp 97.7 F (36.5 C)  BP (!) 138/107  MAP (mmHg) 117  Pulse Rate (!) 132  ECG Heart Rate (!) 132  Resp (!) 25  Level of Consciousness Alert  SpO2 96 %  O2 Device Room Air  Assess: MEWS Score  MEWS Temp 0  MEWS Systolic 0  MEWS Pulse 3  MEWS RR 1  MEWS LOC 0  MEWS Score 4  MEWS Score Color Red  Assess: if the MEWS score is Yellow or Red  Were vital signs taken at a resting state? Yes  Focused Assessment Change from prior assessment (see assessment flowsheet)  Does the patient meet 2 or more of the SIRS criteria? No  MEWS guidelines implemented *See Row Information* Yes  Treat  MEWS Interventions Administered scheduled meds/treatments  Pain Scale 0-10  Pain Score 0  Patients Stated Pain Goal 0  Take Vital Signs  Increase Vital Sign Frequency  Red: Q 1hr X 4 then Q 4hr X 4, if remains red, continue Q 4hrs  Escalate  MEWS: Escalate Red: discuss with charge nurse/RN and provider, consider discussing with RRT  Notify: Charge Nurse/RN  Name of Charge Nurse/RN Notified Marcelyn Ditty, RN  Date Charge Nurse/RN Notified 03/18/22  Time Charge Nurse/RN Notified 0100  Provider Notification  Provider Name/Title Mansy  Date Provider Notified 03/18/22  Time Provider Notified 0050  Method of Notification Page  Notification Reason New onset of dysrhythmia  Provider response See new orders  Date of Provider Response 03/18/22  Time of Provider Response [phone]  Document  Patient Outcome Stabilized after interventions  Progress note created (see row info) Yes  Assess: SIRS CRITERIA  SIRS Temperature  0  SIRS Pulse 1  SIRS Respirations  1  SIRS WBC 1  SIRS Score Sum  3

## 2022-03-18 NOTE — Progress Notes (Signed)
Patient ID: Julie Montgomery, female   DOB: 1935/09/28, 86 y.o.   MRN: [id number]    Progress Note   Subjective   Day # 13  CC; multiple recurrences of C. difficile, completed course of vancomycin during this admission, severe ongoing chronic diarrhea severe malnutrition  TPN IV Solu-Medrol  Patient had episode of A-fib with RVR last p.m., converted  WBC 16.7/hemoglobin 9.1/hematocrit 27.0 Potassium 4.1/BUN 30/creatinine 0.39  Patient most more feisty today complaining about the bed making noise also says she is doing wet at all right-unsure about number of bowel movements as she has been having diarrhea in the bed. Less short of breath but still complaining of some sense of shortness of breath Eating better no nausea or vomiting Says abdominal discomfort about the same Husband at bedside    Objective   Vital signs in last 24 hours: Temp:  [97.7 F (36.5 C)-98.2 F (36.8 C)] 97.9 F (36.6 C) (11/19 0806) Pulse Rate:  [60-132] 79 (11/19 0806) Resp:  [11-26] 20 (11/19 0806) BP: (117-148)/(27-107) 130/70 (11/19 0806) SpO2:  [93 %-98 %] 95 % (11/19 0806) Weight:  [49.4 kg] 49.4 kg (11/19 0322) Last BM Date : 03/17/22 General:    Frail appearing 86 elderly white female in NAD-stronger over the past couple of days Heart:  Regular rate and rhythm; no murmurs Lungs: Respirations even and unlabored, lungs CTA bilaterally Abdomen:  Soft mildly distended, she has tenderness across the lower abdomen normal bowel sounds. Extremities:  Without edema. Neurologic:  Alert and oriented,  grossly normal neurologically. Psych:  Cooperative. Normal mood and affect.  Intake/Output from previous day: 11/18 0701 - 11/19 0700 In: 1189 [P.O.:714; I.V.:475] Out: 1730 [Urine:1730] Intake/Output this shift: No intake/output data recorded.  Lab Results: Recent Labs    03/16/22 0550 03/17/22 0535 03/18/22 0250  WBC 11.9* 11.6* 16.7*  HGB 8.7* 9.3* 9.1*  HCT 26.6* 27.8* 27.0*  PLT 192 262 334    BMET Recent Labs    03/16/22 0550 03/17/22 0535 03/18/22 0250  NA 131* 134* 137  K 4.7 4.7 4.1  CL 95* 97* 98  CO2 '28 29 29  '$ GLUCOSE 142* 181* 134*  BUN 16 20 30*  CREATININE 0.33* <0.30* 0.39*  CALCIUM 9.0 9.5 9.3   LFT Recent Labs    03/16/22 0550  PROT 5.5*  ALBUMIN 2.9*  AST 21  ALT 21  ALKPHOS 81  BILITOT 0.2*   PT/INR No results for input(s): "LABPROT", "INR" in the last 72 hours.  Studies/Results: DG CHEST PORT 1 VIEW  Result Date: 03/16/2022 CLINICAL DATA:  Shortness of breath EXAM: PORTABLE CHEST 1 VIEW COMPARISON:  03/05/2022 FINDINGS: Transverse diameter of heart is within normal limits. Central pulmonary vessels are more prominent. There is subtle increase in interstitial markings in parahilar regions and right lower lung field. Small bilateral pleural effusions are seen. There is no focal pulmonary consolidation. Tip of right upper extremity PICC line is seen in superior vena cava. IMPRESSION: Central pulmonary vessels are more prominent suggesting CHF. There is subtle increase in interstitial markings in both lungs suggesting interstitial pulmonary edema. Small bilateral pleural effusions. Electronically Signed   By: Elmer Picker M.D.   On: 03/16/2022 18:38   DG Abd 1 View  Result Date: 03/16/2022 CLINICAL DATA:  Abdominal distension EXAM: ABDOMEN - 1 VIEW COMPARISON:  None Available. FINDINGS: No dilated large or small bowel. Gas in the rectum. No organomegaly. No pathologic calcifications. IMPRESSION: No evidence of bowel obstruction. Electronically Signed   By: Nicole Kindred  Leonia Reeves M.D.   On: 03/16/2022 18:37       Assessment / Plan:    #51 86 year old white female with prolonged illness secondary to relapsing/recurrent C. difficile.  This admission C. difficile testing was negative she completed the initial course of vancomycin including taper down to twice daily dosing, unfortunately continued to have chronic significant diarrhea  Colon earlier  this week with no evidence of pseudomembranes, biopsies show a mild nonspecific acute colitis not consistent with IBD or microscopic colitis  She has been on low-dose Solu-Medrol over the past 48 hours at 20 mg daily  She feels better but continues to have liquid stool hard to quantitate amount Overall looks much better than earlier this week  #2 episode of A-fib with RVR last p.m.-guarded and in sinus rhythm today #3 volume overload-much improved with diuresis #4 history of persistent ileus-KUB 03/16/2022 no sign of ileus 5 severe protein calorie malnutrition, continue TPN 6 mild dementia 7 anemia stable  Plan; continue dysphagia 3 diet as tolerates Continue TPN Continue Solu-Medrol 20 mg daily Reinsert Flexi-Seal today-patient is unhappy with her bed, this is to be changed back to a regular hospital bed. We will proceed with final paperwork to obtain Vowst-drug will be sent to her outpatient pharmacy, family will need to bring here and then this can be given at some point later this week     Principal Problem:   Ileus ([place]) Active Problems:   Goals of care, counseling/discussion   Dehydration   Protein calorie malnutrition ([place])   Paroxysmal atrial fibrillation ([facility])   Recurrent Clostridioides difficile infection   C. difficile diarrhea   Anemia of chronic disease   Protein-calorie malnutrition, severe   Abnormal x-ray of abdomen   Adynamic ileus ([place])     LOS: 12 days   Julie Pressnell PA-C 03/18/2022, 9:02 AM

## 2022-03-19 ENCOUNTER — Inpatient Hospital Stay (HOSPITAL_COMMUNITY): Payer: Medicare Other

## 2022-03-19 ENCOUNTER — Telehealth: Payer: Self-pay

## 2022-03-19 DIAGNOSIS — I495 Sick sinus syndrome: Secondary | ICD-10-CM

## 2022-03-19 DIAGNOSIS — I480 Paroxysmal atrial fibrillation: Secondary | ICD-10-CM | POA: Diagnosis not present

## 2022-03-19 DIAGNOSIS — K567 Ileus, unspecified: Secondary | ICD-10-CM | POA: Diagnosis not present

## 2022-03-19 LAB — COMPREHENSIVE METABOLIC PANEL
ALT: 50 U/L — ABNORMAL HIGH (ref 0–44)
AST: 38 U/L (ref 15–41)
Albumin: 2.8 g/dL — ABNORMAL LOW (ref 3.5–5.0)
Alkaline Phosphatase: 91 U/L (ref 38–126)
Anion gap: 7 (ref 5–15)
BUN: 39 mg/dL — ABNORMAL HIGH (ref 8–23)
CO2: 30 mmol/L (ref 22–32)
Calcium: 9.1 mg/dL (ref 8.9–10.3)
Chloride: 101 mmol/L (ref 98–111)
Creatinine, Ser: 0.47 mg/dL (ref 0.44–1.00)
GFR, Estimated: >60 mL/min (ref >60)
Glucose, Bld: 119 mg/dL — ABNORMAL HIGH (ref 70–99)
Potassium: 4.3 mmol/L (ref 3.5–5.1)
Sodium: 138 mmol/L (ref 135–145)
Total Bilirubin: 0.3 mg/dL (ref 0.3–1.2)
Total Protein: 5.7 g/dL — ABNORMAL LOW (ref 6.5–8.1)

## 2022-03-19 LAB — CBC WITH DIFFERENTIAL/PLATELET
Abs Immature Granulocytes: 0.26 10*3/uL — ABNORMAL HIGH (ref 0.00–0.07)
Basophils Absolute: 0 10*3/uL (ref 0.0–0.1)
Basophils Relative: 0 %
Eosinophils Absolute: 0 10*3/uL (ref 0.0–0.5)
Eosinophils Relative: 0 %
HCT: 28.1 % — ABNORMAL LOW (ref 36.0–46.0)
Hemoglobin: 9.2 g/dL — ABNORMAL LOW (ref 12.0–15.0)
Immature Granulocytes: 2 %
Lymphocytes Relative: 8 %
Lymphs Abs: 1.1 10*3/uL (ref 0.7–4.0)
MCH: 30.5 pg (ref 26.0–34.0)
MCHC: 32.7 g/dL (ref 30.0–36.0)
MCV: 93 fL (ref 80.0–100.0)
Monocytes Absolute: 1.1 10*3/uL — ABNORMAL HIGH (ref 0.1–1.0)
Monocytes Relative: 8 %
Neutro Abs: 12.1 10*3/uL — ABNORMAL HIGH (ref 1.7–7.7)
Neutrophils Relative %: 82 %
Platelets: 397 10*3/uL (ref 150–400)
RBC: 3.02 MIL/uL — ABNORMAL LOW (ref 3.87–5.11)
RDW: 16.8 % — ABNORMAL HIGH (ref 11.5–15.5)
WBC: 14.6 10*3/uL — ABNORMAL HIGH (ref 4.0–10.5)
nRBC: 0 % (ref 0.0–0.2)

## 2022-03-19 LAB — GLUCOSE, CAPILLARY
Glucose-Capillary: 102 mg/dL — ABNORMAL HIGH (ref 70–99)
Glucose-Capillary: 103 mg/dL — ABNORMAL HIGH (ref 70–99)
Glucose-Capillary: 112 mg/dL — ABNORMAL HIGH (ref 70–99)
Glucose-Capillary: 159 mg/dL — ABNORMAL HIGH (ref 70–99)
Glucose-Capillary: 169 mg/dL — ABNORMAL HIGH (ref 70–99)
Glucose-Capillary: 235 mg/dL — ABNORMAL HIGH (ref 70–99)

## 2022-03-19 LAB — TRIGLYCERIDES: Triglycerides: 54 mg/dL (ref <150)

## 2022-03-19 LAB — FOLATE: Folate: 8 ng/mL (ref >5.9)

## 2022-03-19 LAB — PHOSPHORUS: Phosphorus: 4 mg/dL (ref 2.5–4.6)

## 2022-03-19 LAB — MAGNESIUM: Magnesium: 2.1 mg/dL (ref 1.7–2.4)

## 2022-03-19 LAB — SURGICAL PATHOLOGY

## 2022-03-19 MED ORDER — INSULIN ASPART 100 UNIT/ML IJ SOLN
0.0000 [IU] | INTRAMUSCULAR | Status: DC
  Administered 2022-03-19: 7 [IU] via SUBCUTANEOUS
  Administered 2022-03-19: 4 [IU] via SUBCUTANEOUS
  Administered 2022-03-20: 3 [IU] via SUBCUTANEOUS
  Administered 2022-03-20: 4 [IU] via SUBCUTANEOUS

## 2022-03-19 MED ORDER — FUROSEMIDE 10 MG/ML IJ SOLN
40.0000 mg | Freq: Every day | INTRAMUSCULAR | Status: DC
  Administered 2022-03-19 – 2022-03-28 (×10): 40 mg via INTRAVENOUS
  Filled 2022-03-19 (×11): qty 4

## 2022-03-19 MED ORDER — AMIODARONE HCL 200 MG PO TABS
200.0000 mg | ORAL_TABLET | Freq: Every day | ORAL | Status: DC
  Administered 2022-03-19 – 2022-03-20 (×2): 200 mg via ORAL
  Filled 2022-03-19 (×3): qty 1

## 2022-03-19 MED ORDER — BANATROL TF EN LIQD
60.0000 mL | Freq: Two times a day (BID) | ENTERAL | Status: DC
  Administered 2022-03-19 – 2022-03-20 (×4): 60 mL via ORAL
  Filled 2022-03-19 (×5): qty 60

## 2022-03-19 MED ORDER — TRACE MINERALS CU-MN-SE-ZN 300-55-60-3000 MCG/ML IV SOLN
INTRAVENOUS | Status: AC
  Filled 2022-03-19: qty 464

## 2022-03-19 NOTE — Progress Notes (Signed)
PHARMACY - TOTAL PARENTERAL NUTRITION CONSULT NOTE  Indication:  prolonged hospitalization with severe malnutrition/anasarca  Patient Measurements: Height: '5\' 2"'$  (157.5 cm) Weight: 48.5 kg (107 lb) IBW/kg (Calculated) : 50.1 TPN AdjBW (KG): 41.3 Body mass index is 19.57 kg/m. Usual Weight: 49.5 kg  Assessment:  [name] with malnutrition d/t multiple factors including dementia, recurrent C diff with multiple admissions and GI intolerance, and difficulty chewing with ill-fitting dentures. Pt had poor PO intake prior to admit and reports nut allergy and lactose intolerance. Pharmacy consulted for TPN in setting of malnutrition and inability to meet needs with PO intake.    Patient has reported nut allergy (peanuts) - low risk of cross reactivity with SMOFlipid ingredient. Upon asking for rxn/details, pt and pt spouse do not remember pt having a nut allergy.  Have been tolerating SMOFlipid.  Glucose / Insulin: A1c 5.4% - CBGs elevated due to steroid 11/18, now variable and correlating with PO intake SM '20mg'$  daily started 11/17 PM for colitis Used 19 units SSI in past 24 hrs  Electrolytes: all WNL, CoCa: 10.1  Renal: SCr < 1, BUN WNL Hepatic: LFTs / tbili / TG WNL, albumin 2.8 Intake / Output; MIVF: UOP 0.4 ml/kg/hr, patient received '80mg'$  IV lasix on 11/19, diuresis currently being held, net -4.9L, LBM 11/20, rectal tube re-inserted 11/19   GI Imaging: 11/6 CTAP: persistent colonic distention , cannot exclude toxic megacolon 11/9 Ab Xray: decrease in gas distention, improved ileus  11/10 Ab Xray: improvement in gaseous distention 11/12 Ab Xray: no bowel dilation to suggest obstruction/ileus 11/16 Flex sigmoidoscopy: colon bx, mod diverticulosis 1/17 Abd XR: no bowel obstruction GI Surgeries / Procedures: none since TPN initiation   Central access: PICC placed 03/10/22 TPN start date: 03/10/2022  Nutritional Goals: RD Estimated Needs Total Energy Estimated Needs: 1300-1500 Total  Protein Estimated Needs: 65-80g Total Fluid Estimated Needs: 1.3-1.5L  Current Nutrition:  TPN Dysphagia 3 diet on 11/16 - patient ate ~50% of breakfast and lunch, no dinner  Boost TID - 3x dose charted Prosource TID - 2x doses charted Ensure Enlive daily - 1 charted given  Plan:  Continue concentrated TPN at goal rate 50 mL/hr to provide 100% of needs (1451 kCal and 70g AA) Electrolytes in TPN: Na 181mq/L, reduce K to 318m/L (7865mtotal to 47m62motal, as diuresis has been stopped), Ca 3mEq45m Mg 13mEq47mPhos 30mmol30mmax CL Add standard MVI and trace elements to TPN Increase SSI to resistant, patient with very liable blood sugars, given unreliable PO intake hesitant to add scheduled mealtime insulin.  Monitor TPN labs on Mon/Thurs, recheck BMP 11/21.   HeatherEsmeralda ArthurD  03/19/2022, 10:33 AM

## 2022-03-19 NOTE — Plan of Care (Signed)

## 2022-03-19 NOTE — Progress Notes (Signed)
Patient converted to afib RVR in 130s-140s while complaining of tail bone pain.  BP 121/83, RR 24, sat 95 on 4L [place].  PRN IV metoprolol given per PRN orders.  HR  now 99-106 Afib.

## 2022-03-19 NOTE — Progress Notes (Signed)
PROGRESS NOTE    Julie Montgomery  IHW:[id number] DOB: 07-28-1935 DOA: 03/05/2022 PCP: Cassandria Anger, MD  Chief Complaint  Patient presents with   Fluid Retention    Brief Narrative:  Julie Montgomery is an 86 y.o. female with Julie Montgomery history of dementia, PAF, recurrent C diff, and chronic hyponatremia who presented 11/6 with decreased oral intake. Recent hospitalization from 10/25 to 02/28/22 for recurrent C diff colitis. She was discharge on oral vancomycin taper. At home her diarrhea was improving, taking imodium, though oral intake was very poor. She appeared dehydrated on exam with abdominal tenderness.     Na 129, K 4,3 CL 94 bicarbonate 23, glucose 104 bun 16 cr 0,49  Wbc 13,5 hgb 11,4 plt 356    Abdominal radiograph with marked dilatation of the ascending colon with interval worsening. Mild dilatation of the small bowel loops. Findings consistent with ileus.    CT abdomen and pelvis with persistent and mildly progressive colonic distention with air fluid levels, decreased caliber distal colon at the level of the mid sigmoid colon and increase caliber proximal small bowel loops with the left hemi abdomen. Fecalization of the prominent small bowel loops.    Chest radiograph with hyperinflation and small left pleural effusion, no infiltrates.    She was admitted and treated for recurrent C. diff though work up more suggestive of colonization. ID recommended oral vancomycin with taper. ETEC + in stool, given Eldo Umanzor dose of azithromycin. General surgery initially consulted, signed off. GI consulted, recommended TPN, and ultimately signed off having no further recommendations. The patient's abdominal distention has improved, stool output decreased, though she continues to have significant abdominal pain and require rectal tube. Per family request, GI is asked for any additional recommendations they may have. Flex sigmoidoscopy performed with biopsy showing nonspecific inflammation for which IV  solumedrol started.   Assessment & Plan:   Principal Problem:   Ileus ([place]) Active Problems:   Recurrent Clostridioides difficile infection   Dehydration   Paroxysmal atrial fibrillation (HCC)   Anemia of chronic disease   Protein calorie malnutrition (HCC)   Goals of care, counseling/discussion   C. difficile diarrhea   Protein-calorie malnutrition, severe   Abnormal x-ray of abdomen   Adynamic ileus (HCC)  PAF with RVR - afib with RVR overnight, had 6.76 second pause when converting back to sinus rhythm and then 5.51 second pause again converting back to sinus rhythm  - currently sinus rhythm  - diltiazem daily continuing - continue anticoagulation with eliquis - given significant pauses, will consult cardiology  - Continue cardiac monitoring.   Dyspnea  Third spacing, hypoalbuminemia, acute on chronic HFpEF:  - CXR today shows improving interstitial edema, mild interstitial opacities, central pulm vascular prominence and persistent small effusions - dyspnea seems to be out of proportion to exam/imaging findings -> will reevaluate, consider additional imaging - strict I/O, daily weights  Ileus: Favored Dx in setting of imodium over toxic megacolon at this time with relatively modest leukocytosis (normalized), nontoxic appearance and exam.  - 11/17 plain films without evidence of bowel obstruction  - Continue present management   Diarrhea, nonspecific colitis: Bx at flex sig without evidence of IBD or microscopic colitis at this time.  - Follow final path report with GI path on 11/20.  -> colonic mucosa with nonspecific minimal changes of chronicity - Continue IV solumedrol per GI (suspected cause of rising leukocytosis at this time) - Abd benign/slightly improved - Reinsert rectal tube.   Severe protein calorie  malnutrition:  - Continue TPN, started 11/11 thru PICC placed 11/11.  - labs ordered per RD -> vitamin C, zinc, b1, folate pending as of 11/20  ETEC:  - s/p  azithromycin x1 11/10. Supportive care for this for now. No hematochezia.   Recurrent Clostridioides difficile infection: Repeat work up was negative here, suspect colonization.  - Completed vancomycin with taper per ID (final dose 11/15 pm), DC'ed flagyl per ID. No longer requires isolation. - Patient may benefit from outpatient FMT at tertiary care center after discharge.  ID submitted paperwork for approval for Vowst in conjunction with Dr. Hilarie Fredrickson of GI. No response on authorization at this time. This is an outpatient medication.    Acute urinary retention, pyuria: No UCx growth. Passed TOV 11/11.   Dehydration: Resolved.  - DC IVF with more edema (hypoalbuminemia and immobility also contributing) and TPN    Hypokalemia:  - Supplementing in TPN.     Hypomagnesemia:  - impoved   Hypophosphatemia:  - improved   Dementia: Appears mild. - Continue namenda.  - Family has been at bedside consistently.   Anemia of chronic disease: - No evidence of acute bleeding, monitor intermittently. - anemia labs   Vitamin D deficiency:  - Supplement 50k units weekly x8 weeks started 11/8 and recheck    DVT prophylaxis: eliquis Code Status: full Family Communication: son at bedside Disposition:   Status is: Inpatient Remains inpatient appropriate because: continued need for inpatient care   Consultants:  GI cardiology  Procedures:  Flex sig Impression - erythematous and congested mucosa in the sigmoid colon, biopsied to evaluate for colitis.  Moderate diverticulosis in the sigmoid colon.  Normal rectal mucosa with mild trauma from rectal tube.  Recommendation - Return to hospital ward for ongoing care. ADAT, continue present medications.  Await pathology results.  Consider initiation of steroid therapy based on pathology results.  Vowst will also be considered based on these results.  Cholestyramine 4 g to start tonight.  GI service will follow.  Antimicrobials:  Anti-infectives  (From admission, onward)    Start     Dose/Rate Route Frequency Ordered Stop   03/16/22 0800  vancomycin (VANCOCIN) capsule 125 mg  Status:  Discontinued       See Hyperspace for full Linked Orders Report.   125 mg Oral Daily with breakfast 03/06/22 0220 03/06/22 1240   03/09/22 2200  vancomycin (VANCOCIN) capsule 125 mg       Note to Pharmacy: 02/27/22 - 03/08/22: Take 1 capsule four times daily; 11/10- 11/16: take 1 capsule two times daily; 11/17 - 11/23: take 1 capsule once daily; from 11/24 and on, see other prescrption sent to [facility]     125 mg Oral 2 times daily 03/09/22 1635 03/14/22 2126   03/09/22 1730  azithromycin (ZITHROMAX) 1,000 mg in dextrose 5 % 250 mL IVPB        1,000 mg 260 mL/hr over 60 Minutes Intravenous Every 24 hours 03/09/22 1635 03/09/22 2024   03/09/22 0800  vancomycin (VANCOCIN) capsule 125 mg  Status:  Discontinued       See Hyperspace for full Linked Orders Report.   125 mg Oral 2 times daily with meals 03/06/22 0220 03/06/22 1240   03/06/22 1800  vancomycin (VANCOCIN) 500 mg in sodium chloride irrigation 0.9 % 100 mL ENEMA  Status:  Discontinued        500 mg Rectal Every 6 hours 03/06/22 1429 03/06/22 1528   03/06/22 1800  vancomycin (VANCOCIN) capsule 125 mg  Note to Pharmacy: 02/27/22 - 03/08/22: Take 1 capsule four times daily; 11/10- 11/16: take 1 capsule two times daily; 11/17 - 11/23: take 1 capsule once daily; from 11/24 and on, see other prescrption sent [facility]     125 mg Oral Every 6 hours 03/06/22 1528 03/09/22 1300   03/06/22 1300  metroNIDAZOLE (FLAGYL) IVPB 500 mg  Status:  Discontinued        500 mg 100 mL/hr over 60 Minutes Intravenous Every 8 hours 03/06/22 1249 03/08/22 0838   03/06/22 0800  vancomycin (VANCOCIN) capsule 125 mg  Status:  Discontinued       Note to Pharmacy: 02/27/22 - 03/08/22: Take 1 capsule four times daily; 11/10- 11/16: take 1 capsule two times daily; 11/17 - 11/23: take 1 capsule once daily; from 11/24 and on, see other  prescrption sent [facility]     125 mg Oral 3 times daily before meals & bedtime 03/06/22 0216 03/06/22 1240       Subjective: C/o continued SOB  Objective: Vitals:   03/19/22 0401 03/19/22 0416 03/19/22 0832 03/19/22 1225  BP: 133/63  137/67 123/88  Pulse: 61  79 94  Resp: '18  18 20  '$ Temp:   98.3 F (36.8 C) 98 F (36.7 C)  TempSrc:   Oral Oral  SpO2: 100%  99% 96%  Weight:  48.5 kg    Height:        Intake/Output Summary (Last 24 hours) at 03/19/2022 1448 Last data filed at 03/18/2022 1500 Gross per 24 hour  Intake --  Output 500 ml  Net -500 ml   Filed Weights   03/17/22 0446 03/18/22 0322 03/19/22 0416  Weight: 48.9 kg 49.4 kg 48.5 kg    Examination:  General exam: Appears calm and comfortable  Respiratory system: unlabored Cardiovascular system: RRR, sinus this AM Gastrointestinal system: mildly distended and TTP Central nervous system: Alert and oriented. No focal neurological deficits. Extremities: LE edema much improved   Data Reviewed: I have personally reviewed following labs and imaging studies  CBC: Recent Labs  Lab 03/15/22 0450 03/16/22 0550 03/17/22 0535 03/18/22 0250 03/19/22 0350  WBC 11.1* 11.9* 11.6* 16.7* 14.6*  NEUTROABS 8.5* 9.9* 10.6* 14.3* 12.1*  HGB 9.3* 8.7* 9.3* 9.1* 9.2*  HCT 28.4* 26.6* 27.8* 27.0* 28.1*  MCV 92.2 90.2 90.8 91.2 93.0  PLT 196 192 262 334 427    Basic Metabolic Panel: Recent Labs  Lab 03/13/22 0521 03/14/22 0500 03/15/22 0450 03/16/22 0550 03/17/22 0535 03/18/22 0250 03/19/22 0350  NA 131* 129* 127* 131* 134* 137 138  K 3.8 4.3 5.0 4.7 4.7 4.1 4.3  CL 98 94* 94* 95* 97* 98 101  CO2 '29 29 27 28 29 29 30  '$ GLUCOSE 109* 137* 134* 142* 181* 134* 119*  BUN '11 17 15 16 20 '$ 30* 39*  CREATININE <0.30* <0.30* <0.30* 0.33* <0.30* 0.39* 0.47  CALCIUM 8.1* 8.1* 8.4* 9.0 9.5 9.3 9.1  MG 2.1 1.9 1.9 2.0 2.1  --  2.1  PHOS 3.1 2.9 3.0 3.2  --   --  4.0    GFR: Estimated Creatinine Clearance: 40.1 mL/min (by  C-G formula based on SCr of 0.47 mg/dL).  Liver Function Tests: Recent Labs  Lab 03/15/22 0450 03/16/22 0550 03/19/22 0350  AST 21 21 38  ALT 19 21 50*  ALKPHOS 93 81 91  BILITOT <0.1* 0.2* 0.3  PROT 5.1* 5.5* 5.7*  ALBUMIN 2.1* 2.9* 2.8*    CBG: Recent Labs  Lab 03/18/22  2020 03/18/22 2342 03/19/22 0329 03/19/22 0838 03/19/22 1237  GLUCAP 283* 159* 112* 102* 235*     No results found for this or any previous visit (from the past 240 hour(s)).       Radiology Studies: DG CHEST PORT 1 VIEW  Result Date: 03/19/2022 CLINICAL DATA:  Shortness of breath EXAM: PORTABLE CHEST 1 VIEW COMPARISON:  Radiograph 03/16/2022 FINDINGS: Right upper extremity PICC tip overlies the distal SVC. Unchanged size of the cardiomediastinal silhouette. There are mild interstitial opacities and central pulmonary vascular prominence, decreased from prior exam. Persistent small pleural effusions. No pneumothorax. No new airspace disease. Thoracic spondylosis. Bilateral shoulder degenerative changes. IMPRESSION: Improving interstitial pulmonary edema. Persistent small pleural effusions. Electronically Signed   By: Maurine Simmering M.D.   On: 03/19/2022 11:52        Scheduled Meds:  (feeding supplement) PROSource Plus  30 mL Oral TID BM   apixaban  2.5 mg Oral BID   Chlorhexidine Gluconate Cloth  6 each Topical Daily   diltiazem  240 mg Oral Daily   feeding supplement  1 Container Oral TID BM   feeding supplement  237 mL Oral Q24H   fiber supplement (BANATROL TF)  60 mL Oral BID   Gerhardt's butt cream   Topical BID   insulin aspart  0-20 Units Subcutaneous Q4H   lidocaine  1 patch Transdermal Q24H   memantine  5 mg Oral BID   methylPREDNISolone (SOLU-MEDROL) injection  20 mg Intravenous Daily   sodium chloride flush  10-40 mL Intracatheter Q12H   Vitamin D (Ergocalciferol)  50,000 Units Oral Q7 days   Continuous Infusions:  TPN ADULT (ION) 50 mL/hr at 03/18/22 1801   TPN ADULT (ION)        LOS: 13 days    Time spent: over 30 min    Fayrene Helper, MD [facility]   To contact the attending provider between 7A-7P or the covering provider during after hours 7P-7A, please log into the web site [email or web address] and access using universal [place] password for that web site. If you do not have the password, please call the hospital operator.  03/19/2022, 2:48 PM

## 2022-03-19 NOTE — Consult Note (Signed)
CARDIOLOGY CONSULT NOTE  Patient ID: Julie Montgomery MRN: [id number] DOB/AGE: 12/20/1935 86 y.o.  Admit date: 03/05/2022 Referring Physician  Dr Florene Glen Primary Physician:  Alain Marion Evie Lacks, MD Reason for Consultation  Afib  Patient ID: Julie Montgomery, female    DOB: December 31, 1935, 86 y.o.   MRN: [id number]  Chief Complaint  Patient presents with   Fluid Retention   HPI:    Julie Montgomery  is a 86 y.o. female with dementia, Afib, and recurrent c.diff infection who is in the hospital for decreased oral intake. Cardiology was placed on consultation due to Afib RVR. She had some long sinus pauses when she converted to sinus rhythm but she is not a cadidate for PPM due to PICC line and continued infection. She is currently in normal sinus rhythm, She is asymptomatic. She denies chest pain, shortness of breath, palpitations, diaphoresis, syncope.  Past Medical History:  Diagnosis Date   Adenomatous polyp of colon 2020   Allergy    Anxiety    Basal cell carcinoma (BCC)    Cataract    Depression    Helicobacter pylori gastritis    Osteopenia    Paroxysmal atrial fibrillation (HCC)    Pneumonia    hx of 03/2008   Stroke [facility])    TIA long time ago   Past Surgical History:  Procedure Laterality Date   APPENDECTOMY     BIOPSY  03/15/2022   Procedure: BIOPSY;  Surgeon: Jerene Bears, MD;  Location: [facility];  Service: Gastroenterology;;   BREAST LUMPECTOMY     right   CATARACT EXTRACTION W/ INTRAOCULAR LENS  IMPLANT, BILATERAL Bilateral    CESAREAN [place] N/A 03/15/2022   Procedure: FLEXIBLE SIGMOIDOSCOPY;  Surgeon: Jerene Bears, MD;  Location: [place];  Service: Gastroenterology;  Laterality: N/A;   MOHS SURGERY     PARTIAL HYSTERECTOMY     ROTATOR CUFF REPAIR  2012   SKIN GRAFT     teeth remmoved     Social History   Tobacco Use   Smoking status: Never   Smokeless tobacco: Never  Substance Use Topics   Alcohol  use: No    Family History  Problem Relation Age of Onset   Diabetes Sister    Cancer Sister        lung ca   Coronary artery disease Brother    Dementia Brother    Cancer Brother        lung ca   Stomach cancer Paternal Grandmother    Colon cancer Son    Breast cancer Neg Hx    Esophageal cancer Neg Hx    Rectal cancer Neg Hx     Marital Status: Married  ROS  Review of Systems  Cardiovascular:  Positive for irregular heartbeat and palpitations.   Objective      03/19/2022   12:25 PM 03/19/2022    8:32 AM 03/19/2022    4:16 AM  Vitals with BMI  Weight   107 lbs  BMI   32.44  Systolic 010 272   Diastolic 88 67   Pulse 94 79     Blood pressure 123/88, pulse 94, temperature 98 F (36.7 C), temperature source Oral, resp. rate 20, height '5\' 2"'$  (1.575 m), weight 48.5 kg, SpO2 96 %.    Physical Exam Vitals reviewed.  HENT:     Head: Normocephalic and atraumatic.  Cardiovascular:     Rate and Rhythm:  Normal rate and regular rhythm.     Pulses: Normal pulses.     Heart sounds: Normal heart sounds. No murmur heard. Pulmonary:     Effort: Pulmonary effort is normal.     Breath sounds: Normal breath sounds.  Abdominal:     General: Bowel sounds are normal.  Musculoskeletal:     Right lower leg: No edema.     Left lower leg: No edema.  Skin:    General: Skin is warm and dry.  Neurological:     Mental Status: She is alert.    Laboratory examination:   Recent Labs    03/17/22 0535 03/18/22 0250 03/19/22 0350  NA 134* 137 138  K 4.7 4.1 4.3  CL 97* 98 101  CO2 '29 29 30  '$ GLUCOSE 181* 134* 119*  BUN 20 30* 39*  CREATININE <0.30* 0.39* 0.47  CALCIUM 9.5 9.3 9.1  GFRNONAA NOT CALCULATED >60 >60   estimated creatinine clearance is 40.1 mL/min (by C-G formula based on SCr of 0.47 mg/dL).     Latest Ref Rng & Units 03/19/2022    3:50 AM 03/18/2022    2:50 AM 03/17/2022    5:35 AM  CMP  Glucose 70 - 99 mg/dL 119  134  181   BUN 8 - 23 mg/dL 39  30  20    Creatinine 0.44 - 1.00 mg/dL 0.47  0.39  <0.30   Sodium 135 - 145 mmol/L 138  137  134   Potassium 3.5 - 5.1 mmol/L 4.3  4.1  4.7   Chloride 98 - 111 mmol/L 101  98  97   CO2 22 - 32 mmol/L '30  29  29   '$ Calcium 8.9 - 10.3 mg/dL 9.1  9.3  9.5   Total Protein 6.5 - 8.1 g/dL 5.7     Total Bilirubin 0.3 - 1.2 mg/dL 0.3     Alkaline Phos 38 - 126 U/L 91     AST 15 - 41 U/L 38     ALT 0 - 44 U/L 50         Latest Ref Rng & Units 03/19/2022    3:50 AM 03/18/2022    2:50 AM 03/17/2022    5:35 AM  CBC  WBC 4.0 - 10.5 K/uL 14.6  16.7  11.6   Hemoglobin 12.0 - 15.0 g/dL 9.2  9.1  9.3   Hematocrit 36.0 - 46.0 % 28.1  27.0  27.8   Platelets 150 - 400 K/uL 397  334  262    Lipid Panel Recent Labs    04/05/21 1236 12/15/21 0056 12/15/21 1128 03/10/22 0121 03/12/22 0514 03/19/22 0350  CHOL 220*  --  156  --   --   --   TRIG 65.0  --  84 38 70 54  LDLCALC 127*  --  86  --   --   --   VLDL 13.0  --  17  --   --   --   HDL 79.50  --  53  --   --   --   CHOLHDL 3  --  2.9  --   --   --   LDLDIRECT  --  82  --   --   --   --     HEMOGLOBIN A1C Lab Results  Component Value Date   HGBA1C 5.4 03/12/2022   MPG 108.28 03/12/2022   TSH Recent Labs    12/14/21 1700 01/26/22 1447 02/28/22 1056  TSH  6.357* 3.61 4.151   BNP (last 3 results) Recent Labs    12/24/21 0323  BNP 276.8*   Cardiac Panel (last 3 results) No results for input(s): "CKTOTAL", "CKMB", "TROPONINIHS", "RELINDX" in the last 72 hours.   Medications and allergies   Allergies  Allergen Reactions   Lactose Intolerance (Gi) Other (See Comments)    Intolerance    Lovastatin Other (See Comments)    Unknown reaction   Mometasone Furo-Formoterol Fum Other (See Comments)    Loss of appetite, laryngitis    Peanut-Containing Drug Products Other (See Comments)   Prozac [Fluoxetine Hcl] Other (See Comments)    Jumpy   Sulfa Antibiotics Other (See Comments)    Unknown reaction     Current Meds  Medication Sig    apixaban (ELIQUIS) 2.5 MG TABS tablet Take 1 tablet (2.5 mg total) by mouth 2 (two) times daily.   diltiazem (CARDIZEM CD) 180 MG 24 hr capsule Take 1 capsule (180 mg total) by mouth daily.   memantine (NAMENDA) 5 MG tablet Take 5 mg by mouth 2 (two) times daily.   Multiple Vitamins-Minerals (ZINC PO) Take 1 tablet by mouth daily.   Probiotic Product (ALIGN) 4 MG CAPS Take 1 capsule (4 mg total) by mouth daily.   [EXPIRED] saccharomyces boulardii (FLORASTOR) 250 MG capsule Take 1 capsule (250 mg total) by mouth 2 (two) times daily.   vancomycin (VANCOCIN) 125 MG capsule 02/27/22 - 03/08/22: Take 1 capsule four times daily; 11/10- 11/16: take 1 capsule two times daily; 11/17 - 11/23: take 1 capsule once daily; from 11/24 and on, see other prescrption sent to [facility]    Scheduled Meds:  (feeding supplement) PROSource Plus  30 mL Oral TID BM   apixaban  2.5 mg Oral BID   Chlorhexidine Gluconate Cloth  6 each Topical Daily   diltiazem  240 mg Oral Daily   feeding supplement  1 Container Oral TID BM   feeding supplement  237 mL Oral Q24H   Gerhardt's butt cream   Topical BID   insulin aspart  0-20 Units Subcutaneous Q4H   lidocaine  1 patch Transdermal Q24H   memantine  5 mg Oral BID   methylPREDNISolone (SOLU-MEDROL) injection  20 mg Intravenous Daily   sodium chloride flush  10-40 mL Intracatheter Q12H   Vitamin D (Ergocalciferol)  50,000 Units Oral Q7 days   Continuous Infusions:  TPN ADULT (ION) 50 mL/hr at 03/18/22 1801   TPN ADULT (ION)     PRN Meds:.acetaminophen **OR** acetaminophen, fentaNYL (SUBLIMAZE) injection, naLOXone (NARCAN)  injection, ondansetron (ZOFRAN) IV, sodium chloride flush   I/O last 3 completed shifts: In: 160 [P.O.:360; I.V.:475] Out: 1400 [Urine:1400] No intake/output data recorded.  Net IO Since Admission: -4,923 mL [03/19/22 1341]   Radiology:   Imaging results have been reviewed and DG CHEST PORT 1 VIEW  Result Date: 03/19/2022 CLINICAL DATA:   Shortness of breath EXAM: PORTABLE CHEST 1 VIEW COMPARISON:  Radiograph 03/16/2022 FINDINGS: Right upper extremity PICC tip overlies the distal SVC. Unchanged size of the cardiomediastinal silhouette. There are mild interstitial opacities and central pulmonary vascular prominence, decreased from prior exam. Persistent small pleural effusions. No pneumothorax. No new airspace disease. Thoracic spondylosis. Bilateral shoulder degenerative changes. IMPRESSION: Improving interstitial pulmonary edema. Persistent small pleural effusions. Electronically Signed   By: Maurine Simmering M.D.   On: 03/19/2022 11:52    Cardiac Studies:  12/14/21: TTE  1. Left ventricular ejection fraction, by estimation, is 55 to 60%. The  left ventricle  has normal function. The left ventricle has no regional  wall motion abnormalities. Left ventricular diastolic function could not  be evaluated.   2. Right ventricular systolic function is low normal. The right  ventricular size is normal. There is normal pulmonary artery systolic  pressure. The estimated right ventricular systolic pressure is 28.3 mmHg.   3. The mitral valve is degenerative. Mild mitral valve regurgitation. No  evidence of mitral stenosis.   4. The aortic valve is grossly normal. Aortic valve regurgitation is not  visualized. Aortic valve sclerosis/calcification is present, without any  evidence of aortic stenosis.   5. The inferior vena cava is normal in size with greater than 50%  respiratory variability, suggesting right atrial pressure of 3 mmHg.   6. Rhythm strip during this exam demonstrates Afib RVR.   EKG:  SR 77bpm, no acute/ischemic changes    Telemetry:  2 post conversion pauses, overnight 6.74 and 5.53 seconds  Assessment   Afib RVR, currently NSR   Recurrent Clostridioides difficile infection  Dementia  Anemia of chronic disease  Multiple electrolyte derangements    Recommendations:   Maintain on telemetry Optimize  electrolytes Appreciate EP recommendations Not PPM candidate due to PICC line Agree with amiodarone daily - if pauses on amio, may need PPM in the future Continue Eliquis '5mg'$  BID Follow-up with Dr Terri Skains as outpatient   Floydene Flock, DO, [facility] 03/19/2022, 1:41 PM Office: [phone]

## 2022-03-19 NOTE — Progress Notes (Addendum)
Pt had 6.76 second pause converting back to SR at 0309, went back Into afib rvr and converted back to SR after 5.51 sec pause at 0314.  Pt BP 119/73-126/64 during this time.  Reported brief period of dizziness following second conversion back to SR.  Pt also reported 9-10/10 tail bone pain during this time. Patient currently denies any pain or dizziness, Sinus brady 52, O2 sat 100% and bp 115/60 MAP 70.  Dr. Myna Hidalgo, Charge RN and RRT notified of patient's pauses.  Beta blocker discontinued at this time.  Will notify MD of any changes.

## 2022-03-19 NOTE — Consult Note (Signed)
Cardiology Consultation   Patient ID: Julie Montgomery MRN: [id number]; DOB: 1936-01-27  Admit date: 03/05/2022 Date of Consult: 03/19/2022  PCP:  Cassandria Anger, MD   [facility] Cardiologist:  Dr. Terri Skains   Patient Profile:   Julie Montgomery is a 86 y.o. female with a hx of bronchiectasis, TIA. Recurrent Cdif (2016, 2018, Aug 2023), AFib who is being seen 03/19/2022 for the evaluation of post termination pausing at the request of Dr. Einar Gip.  History of Present Illness:   [name] has recent history of recurrent C. difficile infections, with 4 previous hospitalizations since mid August 2023.  She was most recently discharged to home on 02/28/2022 with a prolonged course of oral vancomycin, which is scheduled to extend into at least the last week of November 2023.  She was brought by her husband with progressive reduction in oral intack,, concerns of dehydration , no diarrhea for a few days, though + for some belly discomfort.  GI brought on board for olonic ileus/concern for toxic megacolon on CT. No signs of perforation Surgery as well, not planned for surgical intervention, s/o 03/08/22 ID on board, consider fecal transplant if approved  Belly started to improve, and suspect ileus in setting of Immodium Recurrent diarrhea TPN started 03/10/22 PICC placed  Palliative on board, full code  ID signed off 03/09/22, to complete 11/15,, recommended colonoscopy for other etiologies, Cdiff testing negative on 11/10. She is lilky just colonized with c. Diff   03/15/22, flex sig -> erythematous and congested mucosa in sigmoid colon, biopsied, awaiting path results  03/16/22: GI notes need to replace rectal tube to decompress, belly more distended, started steroids 03/17/22: doing a bit better, attempting to eat  Lasix has been pulsed intermittently for volume OL > BID IV > daily  AFib w/RVR 11/18 evening, restarted on a/c, IV lopressor PRN  Early this AM  reported 2 post termination pauses and cardiology consulted.  [facility] Card[facility] called, they have not yet seen her, but asked EP to weigh in   LABS K+ 4.3 BUN/Creat 39/0.47 Mag 2.1 AST 38 ALT 50 WBC 14.6 (on steroid) H/H 9.2/28.1 Plts 397  BP stable, on/off O2  Afebrile  Eliquis 2.'5mg'$  BID (appropriately) Dilt '240mg'$  daily started yesterday (home dose is 180)  Still on TPN   No CP, palpitations, perhaps dizzy early this morning with pauses, bed is extremely uncomfortable, alarms keep her awake.   Past Medical History:  Diagnosis Date   Adenomatous polyp of colon 2020   Allergy    Anxiety    Basal cell carcinoma (BCC)    Cataract    Depression    Helicobacter pylori gastritis    Osteopenia    Paroxysmal atrial fibrillation (HCC)    Pneumonia    hx of 03/2008   Stroke [facility])    TIA long time ago    Past Surgical History:  Procedure Laterality Date   APPENDECTOMY     BIOPSY  03/15/2022   Procedure: BIOPSY;  Surgeon: Jerene Bears, MD;  Location: [facility];  Service: Gastroenterology;;   BREAST LUMPECTOMY     right   CATARACT EXTRACTION W/ INTRAOCULAR LENS  IMPLANT, BILATERAL Bilateral    CESAREAN [place] N/A 03/15/2022   Procedure: FLEXIBLE SIGMOIDOSCOPY;  Surgeon: Jerene Bears, MD;  Location: [place];  Service: Gastroenterology;  Laterality: N/A;   MOHS SURGERY     PARTIAL HYSTERECTOMY  ROTATOR CUFF REPAIR  2012   SKIN GRAFT     teeth remmoved       Home Medications:  Prior to Admission medications   Medication Sig Start Date End Date Taking? Authorizing Provider  apixaban (ELIQUIS) 2.5 MG TABS tablet Take 1 tablet (2.5 mg total) by mouth 2 (two) times daily. 03/01/22  Yes Plotnikov, Evie Lacks, MD  diltiazem (CARDIZEM CD) 180 MG 24 hr capsule Take 1 capsule (180 mg total) by mouth daily. 03/01/22  Yes Sheikh, Omair Latif, DO  memantine (NAMENDA) 5 MG tablet Take 5 mg by mouth 2 (two) times daily.  02/20/22  Yes [provider]  Multiple Vitamins-Minerals (ZINC PO) Take 1 tablet by mouth daily.   Yes [provider]  Probiotic Product (ALIGN) 4 MG CAPS Take 1 capsule (4 mg total) by mouth daily. 12/26/21  Yes Plotnikov, Evie Lacks, MD  vancomycin (VANCOCIN) 125 MG capsule 02/27/22 - 03/08/22: Take 1 capsule four times daily; 11/10- 11/16: take 1 capsule two times daily; 11/17 - 11/23: take 1 capsule once daily; from 11/24 and on, see other prescrption sent to [facility] 02/27/22  Yes Nita Sells, MD  vancomycin (VANCOCIN) 125 MG capsule 03/23/22 - 04/19/22: take 1 capsule every other day; 04/20/22- 05/20/22: take 1 capsule every three days Patient not taking: Reported on 03/01/2022 03/23/22   Nita Sells, MD    Inpatient Medications: Scheduled Meds:  (feeding supplement) PROSource Plus  30 mL Oral TID BM   apixaban  2.5 mg Oral BID   Chlorhexidine Gluconate Cloth  6 each Topical Daily   diltiazem  240 mg Oral Daily   feeding supplement  1 Container Oral TID BM   feeding supplement  237 mL Oral Q24H   Gerhardt's butt cream   Topical BID   insulin aspart  0-20 Units Subcutaneous Q4H   lidocaine  1 patch Transdermal Q24H   memantine  5 mg Oral BID   methylPREDNISolone (SOLU-MEDROL) injection  20 mg Intravenous Daily   sodium chloride flush  10-40 mL Intracatheter Q12H   Vitamin D (Ergocalciferol)  50,000 Units Oral Q7 days   Continuous Infusions:  TPN ADULT (ION) 50 mL/hr at 03/18/22 1801   TPN ADULT (ION)     PRN Meds: acetaminophen **OR** acetaminophen, fentaNYL (SUBLIMAZE) injection, naLOXone (NARCAN)  injection, ondansetron (ZOFRAN) IV, sodium chloride flush  Allergies:    Allergies  Allergen Reactions   Lactose Intolerance (Gi) Other (See Comments)    Intolerance    Lovastatin Other (See Comments)    Unknown reaction   Mometasone Furo-Formoterol Fum Other (See Comments)    Loss of appetite, laryngitis    Peanut-Containing Drug Products Other  (See Comments)   Prozac [Fluoxetine Hcl] Other (See Comments)    Jumpy   Sulfa Antibiotics Other (See Comments)    Unknown reaction    Social History:   Social History   Socioeconomic History   Marital status: Married    Spouse name: Not on file   Number of children: 4   Years of education: Not on file   Highest education level: Not on file  Occupational History   Occupation: Oceanographer  Tobacco Use   Smoking status: Never   Smokeless tobacco: Never  Vaping Use   Vaping Use: Never used  Substance and Sexual Activity   Alcohol use: No   Drug use: No   Sexual activity: Not Currently  Other Topics Concern   Not on file  Social History Narrative   Married, Retired,  she was looking after her GGD, now in court battle   Social Determinants of Health   Financial Resource Strain: Low Risk  (02/27/2021)   Overall Financial Resource Strain (CARDIA)    Difficulty of Paying Living Expenses: Not hard at all  Food Insecurity: No Food Insecurity (02/26/2022)   Hunger Vital Sign    Worried About Running Out of Food in the Last Year: Never true    Ran Out of Food in the Last Year: Never true  Transportation Needs: No Transportation Needs (02/26/2022)   PRAPARE - Hydrologist (Medical): No    Lack of Transportation (Non-Medical): No  Physical Activity: Inactive (02/27/2021)   Exercise Vital Sign    Days of Exercise per Week: 0 days    Minutes of Exercise per Session: 0 min  Stress: Stress Concern Present (02/27/2021)   [place]    Feeling of Stress : Very much  Social Connections: Socially Integrated (02/27/2021)   Social Connection and Isolation Panel [NHANES]    Frequency of Communication with Friends and Family: More than three times a week    Frequency of Social Gatherings with Friends and Family: More than three times a week    Attends Religious Services: More than 4 times  per year    Active Member of [employer] or Organizations: Yes    Attends Music therapist: More than 4 times per year    Marital Status: Married  Human resources officer Violence: Not At Risk (02/26/2022)   Humiliation, Afraid, Rape, and Kick questionnaire    Fear of Current or Ex-Partner: No    Emotionally Abused: No    Physically Abused: No    Sexually Abused: No    Family History:   Family History  Problem Relation Age of Onset   Diabetes Sister    Cancer Sister        lung ca   Coronary artery disease Brother    Dementia Brother    Cancer Brother        lung ca   Stomach cancer Paternal Grandmother    Colon cancer Son    Breast cancer Neg Hx    Esophageal cancer Neg Hx    Rectal cancer Neg Hx      ROS:  Please see the history of present illness.  All other ROS reviewed and negative.     Physical Exam/Data:   Vitals:   03/19/22 0401 03/19/22 0416 03/19/22 0832 03/19/22 1225  BP: 133/63  137/67 123/88  Pulse: 61  79 94  Resp: '18  18 20  '$ Temp:   98.3 F (36.8 C) 98 F (36.7 C)  TempSrc:   Oral Oral  SpO2: 100%  99% 96%  Weight:  48.5 kg    Height:        Intake/Output Summary (Last 24 hours) at 03/19/2022 1405 Last data filed at 03/18/2022 1500 Gross per 24 hour  Intake --  Output 500 ml  Net -500 ml      03/19/2022    4:16 AM 03/18/2022    3:22 AM 03/17/2022    4:46 AM  Last 3 Weights  Weight (lbs) 107 lb 109 lb 107 lb 12.9 oz  Weight (kg) 48.535 kg 49.442 kg 48.9 kg     Body mass index is 19.57 kg/m.  General:  Well nourished, well developed, in no acute distress, appears ill HEENT: normal Neck: no JVD Vascular: No carotid bruits Cardiac:  RRR; no murmurs, gallops or rubs Lungs:  CTA b/l, no wheezing, rhonchi or rales  Abd: soft, nontender to light palp  Ext: no edema Musculoskeletal:  No deformities, advanced atrophy/cachectic in appearance Skin: warm and dry  Neuro:  no gross focal abnormalities noted Psych:  Normal affect   EKG:   The EKG was personally reviewed and demonstrates:    Admit: SR 77bpm, no acute/ischemic changes Yesterday: Afib 134  Telemetry:  Telemetry was personally reviewed and demonstrates:  SR/ST mostly 90's >> PAFib w/RVR she has had 2 post conversion pauses, overnight 6.74 and 5.53 seconds  Relevant CV Studies:  12/14/21: TTE  1. Left ventricular ejection fraction, by estimation, is 55 to 60%. The  left ventricle has normal function. The left ventricle has no regional  wall motion abnormalities. Left ventricular diastolic function could not  be evaluated.   2. Right ventricular systolic function is low normal. The right  ventricular size is normal. There is normal pulmonary artery systolic  pressure. The estimated right ventricular systolic pressure is 14.4 mmHg.   3. The mitral valve is degenerative. Mild mitral valve regurgitation. No  evidence of mitral stenosis.   4. The aortic valve is grossly normal. Aortic valve regurgitation is not  visualized. Aortic valve sclerosis/calcification is present, without any  evidence of aortic stenosis.   5. The inferior vena cava is normal in size with greater than 50%  respiratory variability, suggesting right atrial pressure of 3 mmHg.   6. Rhythm strip during this exam demonstrates Afib RVR.   Laboratory Data:  High Sensitivity Troponin:  No results for input(s): "TROPONINIHS" in the last 720 hours.   Chemistry Recent Labs  Lab 03/16/22 0550 03/17/22 0535 03/18/22 0250 03/19/22 0350  NA 131* 134* 137 138  K 4.7 4.7 4.1 4.3  CL 95* 97* 98 101  CO2 '28 29 29 30  '$ GLUCOSE 142* 181* 134* 119*  BUN 16 20 30* 39*  CREATININE 0.33* <0.30* 0.39* 0.47  CALCIUM 9.0 9.5 9.3 9.1  MG 2.0 2.1  --  2.1  GFRNONAA >60 NOT CALCULATED >60 >60  ANIONGAP '8 8 10 7    '$ Recent Labs  Lab 03/15/22 0450 03/16/22 0550 03/19/22 0350  PROT 5.1* 5.5* 5.7*  ALBUMIN 2.1* 2.9* 2.8*  AST 21 21 38  ALT 19 21 50*  ALKPHOS 93 81 91  BILITOT <0.1* 0.2* 0.3    Lipids  Recent Labs  Lab 03/19/22 0350  TRIG 54    Hematology Recent Labs  Lab 03/17/22 0535 03/18/22 0250 03/19/22 0350  WBC 11.6* 16.7* 14.6*  RBC 3.06* 2.96* 3.02*  HGB 9.3* 9.1* 9.2*  HCT 27.8* 27.0* 28.1*  MCV 90.8 91.2 93.0  MCH 30.4 30.7 30.5  MCHC 33.5 33.7 32.7  RDW 16.2* 16.4* 16.8*  PLT 262 334 397   Thyroid No results for input(s): "TSH", "FREET4" in the last 168 hours.  BNPNo results for input(s): "BNP", "PROBNP" in the last 168 hours.  DDimer No results for input(s): "DDIMER" in the last 168 hours.   Radiology/Studies:  DG CHEST PORT 1 VIEW  Result Date: 03/19/2022 CLINICAL DATA:  Shortness of breath EXAM: PORTABLE CHEST 1 VIEW COMPARISON:  Radiograph 03/16/2022 FINDINGS: Right upper extremity PICC tip overlies the distal SVC. Unchanged size of the cardiomediastinal silhouette. There are mild interstitial opacities and central pulmonary vascular prominence, decreased from prior exam. Persistent small pleural effusions. No pneumothorax. No new airspace disease. Thoracic spondylosis. Bilateral shoulder degenerative changes. IMPRESSION: Improving interstitial pulmonary edema. Persistent  small pleural effusions. Electronically Signed   By: Maurine Simmering M.D.   On: 03/19/2022 11:52   DG CHEST PORT 1 VIEW  Result Date: 03/16/2022 CLINICAL DATA:  Shortness of breath EXAM: PORTABLE CHEST 1 VIEW COMPARISON:  03/05/2022 FINDINGS: Transverse diameter of heart is within normal limits. Central pulmonary vessels are more prominent. There is subtle increase in interstitial markings in parahilar regions and right lower lung field. Small bilateral pleural effusions are seen. There is no focal pulmonary consolidation. Tip of right upper extremity PICC line is seen in superior vena cava. IMPRESSION: Central pulmonary vessels are more prominent suggesting CHF. There is subtle increase in interstitial markings in both lungs suggesting interstitial pulmonary edema. Small bilateral  pleural effusions. Electronically Signed   By: Elmer Picker M.D.   On: 03/16/2022 18:38   DG Abd 1 View  Result Date: 03/16/2022 CLINICAL DATA:  Abdominal distension EXAM: ABDOMEN - 1 VIEW COMPARISON:  None Available. FINDINGS: No dilated large or small bowel. Gas in the rectum. No organomegaly. No pathologic calcifications. IMPRESSION: No evidence of bowel obstruction. Electronically Signed   By: Suzy Bouchard M.D.   On: 03/16/2022 18:37     Assessment and Plan:   Paroxysmal AFib CHA2DS2Vasc is 5, on Eliquis appropriately dosed RVR w/post termination pauses  She is at prohibitive risk for PPM infection with PICC in place and prolonged hospitalization, on IV steroids.  Recommend AAD to try and maintain SR, consider amiodarone. Will start '200mg'$  daily  Dr. Quentin Ore has seen the patient     Risk Assessment/Risk Scores:    For questions or updates, please contact [place] Please consult [email or web address] for contact info under    Signed, Baldwin [place], PA-C  03/19/2022 2:05 PM

## 2022-03-19 NOTE — Progress Notes (Signed)
Calorie Count Note- Day 1  48 hour calorie count ordered.  Diet: dysphagia 3, thin liquids Supplements: Boost Breeze TID, ProSource Plus TID, Ensure Plus High Protein once daily  No meal tickets received.   Spoke with RN and pt's son Ronalee Belts), at bedside, as well as pt. She had a difficult time recalling what she ate yesterday. Her son reports that she ate about 50% of breakfast and lunch yesterday but did not eat her dinner.   Per MAR, pt consumed 2 ProSource Plus, 3 Boost Breeze and 1 Ensure yesterday. (1300kcal and 77g protein)  Based on supplement intake she met 100% of her minimum estimated calorie needs and 118% of her minimum estimated protein needs. Will follow up tomorrow to assess intake. As long as pt continues to consistently consume supplements and some of each meal, can consider weaning TPN.   Nutrition Dx: Severe Malnutrition related to chronic illness (recurrent c. diff) as evidenced by severe fat depletion, severe muscle depletion.   Goal: Patient will meet greater than or equal to 90% of their needs   Intervention:  TPN management per pharmacy Would not recommend weaning TPN below 50% of needs until pt has consistently demonstrated she can meet her needs orally as she is severely malnourished Continue Boost Breeze po TID, each supplement provides 250 kcal and 9 grams of protein 30 ml ProSource Plus TID, each supplement provides 100 kcals and 15 grams protein.  Ensure Plus High Protein 1x/d to provide 350kcal and 20g of protein  Clayborne Dana, RDN, LDN Clinical Nutrition

## 2022-03-19 NOTE — Telephone Encounter (Signed)
[facility] enrollment form signed & faxed.

## 2022-03-19 NOTE — Progress Notes (Signed)
Physical Therapy Treatment Patient Details Name: Julie Montgomery MRN: [id number] DOB: 08-Jun-1935 Today's Date: 03/19/2022   History of Present Illness Pt is an 86 year old woman who came to the ED on11/06 with distended abdomen, LE swelling, poor PO intake with dehydration. She was discharged 4 days prior with c-diff. Likely recurrent c-diff  and colonic ileus. PMH: CHF, dementia, PAF, chronic hyponatremia, anemia, TIA, osteopenia.    PT Comments    Pt finishing up with self care at the sink with OT and agreeable to working with PT. Pt requires increased encouragement to go into hallway, however is able to ambulate with RW and min A. Pt with c/o of abdomen pain and tightness. Pt also with decreased memory of prior success with walking in hallway. Pt is making good progress towards her mobility goals. PT will continue to follow acutely.    Recommendations for follow up therapy are one component of a multi-disciplinary discharge planning process, led by the attending physician.  Recommendations may be updated based on patient status, additional functional criteria and insurance authorization.  Follow Up Recommendations  Home health PT ([facility]haide)     Assistance Recommended at Discharge Frequent or constant Supervision/Assistance  Patient can return home with the following A little help with walking and/or transfers;A little help with bathing/dressing/bathroom;Assistance with cooking/housework;Direct supervision/assist for medications management;Direct supervision/assist for financial management;Assist for transportation;Help with stairs or ramp for entrance;Assistance with feeding   Equipment Recommendations  None recommended by PT    Recommendations for Other Services       Precautions / Restrictions Precautions Precautions: Fall Precaution Comments: incontinent of stool and urine Restrictions Weight Bearing Restrictions: No     Mobility  Bed Mobility      Up at sink with OT               Transfers Overall transfer level: Needs assistance Equipment used: Rolling walker (2 wheels) Transfers: Sit to/from Stand Sit to Stand: Min assist, Mod assist (mod A from low surface without arm rest)           General transfer comment: min A for power up and steadying from bed and modA for power up from low chair without armrests    Ambulation/Gait Ambulation/Gait assistance: Min assist Gait Distance (Feet): 50 Feet Assistive device: Rolling walker (2 wheels) Gait Pattern/deviations: Step-through pattern, Decreased stride length, Narrow base of support Gait velocity: decreased Gait velocity interpretation: <1.31 ft/sec, indicative of household ambulator   General Gait Details: pt with increased concern about abilities, however with encouragement able to walk out in hallway and back to room      Balance Overall balance assessment: Needs assistance Sitting-balance support: Feet supported Sitting balance-Leahy Scale: Fair Sitting balance - Comments: sits statically at EOB, LOB posteriorly with BLE elevation for LB dressing   Standing balance support: Bilateral upper extremity supported Standing balance-Leahy Scale: Poor Standing balance comment: Reliant on RW                            Cognition Arousal/Alertness: Awake/alert Behavior During Therapy: Flat affect Overall Cognitive Status: History of cognitive impairments - at baseline Area of Impairment: Memory, Problem solving, Awareness, Following commands, Safety/judgement                     Memory: Decreased short-term memory Following Commands: Follows one step commands with increased time     Problem Solving: Slow processing, Difficulty sequencing, Requires verbal cues  General Comments: dementia hx, frequently anxious regarding mobility and capabilities; as well as breathing during ADL/mobility states she needs to "catch her breath"           General Comments General  comments (skin integrity, edema, etc.): VSS on RA throughout session      Pertinent Vitals/Pain Pain Assessment Pain Assessment: Faces Faces Pain Scale: Hurts little more Pain Location: back and stomach Pain Descriptors / Indicators: Discomfort Pain Intervention(s): Limited activity within patient's tolerance, Monitored during session, Repositioned     PT Goals (current goals can now be found in the care plan section) Acute Rehab PT Goals PT Goal Formulation: With patient Time For Goal Achievement: 03/23/22 Potential to Achieve Goals: Fair Progress towards PT goals: Progressing toward goals    Frequency    Min 3X/week      PT Plan Current plan remains appropriate       AM-PAC PT "6 Clicks" Mobility   Outcome Measure  Help needed turning from your back to your side while in a flat bed without using bedrails?: A Little Help needed moving from lying on your back to sitting on the side of a flat bed without using bedrails?: A Little Help needed moving to and from a bed to a chair (including a wheelchair)?: A Little Help needed standing up from a chair using your arms (e.g., wheelchair or bedside chair)?: A Little Help needed to walk in hospital room?: A Little Help needed climbing 3-5 steps with a railing? : Total 6 Click Score: 16    End of Session Equipment Utilized During Treatment: Gait belt Activity Tolerance: Patient tolerated treatment well Patient left: in chair;with call bell/phone within reach;with chair alarm set Nurse Communication: Mobility status PT Visit Diagnosis: Unsteadiness on feet (R26.81);Muscle weakness (generalized) (M62.81);Difficulty in walking, not elsewhere classified (R26.2);Pain Pain - part of body:  (abdomen)     Time: 3748-2707 PT Time Calculation (min) (ACUTE ONLY): 22 min  Charges:  $Gait Training: 8-22 mins                     Thursa Emme B. Migdalia Dk PT, DPT Acute Rehabilitation Services Please use secure chat or  Call Office  [phone]    [place] 03/19/2022, 4:54 PM

## 2022-03-19 NOTE — Progress Notes (Addendum)
Occupational Therapy Treatment Patient Details Name: Julie Montgomery MRN: [id number] DOB: 11-11-1935 Today's Date: 03/19/2022   History of present illness Pt is an 86 year old woman who came to the ED on11/06 with distended abdomen, LE swelling, poor PO intake with dehydration. She was discharged 4 days prior with c-diff. Likely recurrent c-diff  and colonic ileus. PMH: CHF, dementia, PAF, chronic hyponatremia, anemia, TIA, osteopenia.   OT comments  Pt progressing towards goals this session, needing min guard-mod A for ADLs, able to tolerate ~10 min in standing at sink for ADL with min guard A. Pt frequently reporting it feels like she's "panting", however SpO2 above 94% throughout session. Pt min-mod A for transfers, needing x1 seated rest break prior to ambulating in hallway. Pt presenting with impairments listed below, will follow acutely. Continue to recommend HHOT at d/c.   Recommendations for follow up therapy are one component of a multi-disciplinary discharge planning process, led by the attending physician.  Recommendations may be updated based on patient status, additional functional criteria and insurance authorization.    Follow Up Recommendations  Home health OT [facility])     Assistance Recommended at Discharge Frequent or constant Supervision/Assistance  Patient can return home with the following  A lot of help with walking and/or transfers;A lot of help with bathing/dressing/bathroom;Assistance with cooking/housework;Direct supervision/assist for medications management;Direct supervision/assist for financial management;Assist for transportation;Help with stairs or ramp for entrance   Equipment Recommendations  None recommended by OT    Recommendations for Other Services      Precautions / Restrictions Precautions Precautions: Fall Precaution Comments: incontinent of stool and urine Restrictions Weight Bearing Restrictions: No       Mobility Bed Mobility Overal  bed mobility: Needs Assistance Bed Mobility: Supine to Sit, Sit to Supine     Supine to sit: Min assist Sit to supine: Min assist        Transfers Overall transfer level: Needs assistance Equipment used: Rolling walker (2 wheels) Transfers: Sit to/from Stand Sit to Stand: Min assist                 Balance Overall balance assessment: Needs assistance Sitting-balance support: Feet supported Sitting balance-Leahy Scale: Fair Sitting balance - Comments: sits statically at EOB, LOB posteriorly with BLE elevation for LB dressing   Standing balance support: Bilateral upper extremity supported Standing balance-Leahy Scale: Poor Standing balance comment: Reliant on RW                           ADL either performed or assessed with clinical judgement   ADL Overall ADL's : Needs assistance/impaired     Grooming: Wash/dry hands;Oral care;Set up Grooming Details (indicate cue type and reason): standing at sink x10 min         Upper Body Dressing : Minimal assistance;Sitting Upper Body Dressing Details (indicate cue type and reason): donning clean gown Lower Body Dressing: Moderate assistance;Sitting/lateral leans Lower Body Dressing Details (indicate cue type and reason): mod A due to stomach pain Toilet Transfer: Min guard;Ambulation;Regular Toilet;Rolling walker (2 wheels) Toilet Transfer Details (indicate cue type and reason): simulated via functional mobility Toileting- Clothing Manipulation and Hygiene: Total assistance;Bed level Toileting - Clothing Manipulation Details (indicate cue type and reason): flexiseal; incontinent     Functional mobility during ADLs: Min guard;Rolling walker (2 wheels)      Extremity/Trunk Assessment Upper Extremity Assessment Upper Extremity Assessment: Generalized weakness   Lower Extremity Assessment Lower Extremity Assessment:  Defer to PT evaluation        Vision   Vision Assessment?: No apparent visual deficits    Perception Perception Perception: Not tested   Praxis Praxis Praxis: Not tested    Cognition Arousal/Alertness: Awake/alert Behavior During Therapy: Flat affect Overall Cognitive Status: History of cognitive impairments - at baseline Area of Impairment: Memory, Problem solving, Awareness, Following commands, Safety/judgement                     Memory: Decreased short-term memory Following Commands: Follows one step commands with increased time     Problem Solving: Slow processing, Difficulty sequencing, Requires verbal cues General Comments: dementia hx, frequently anxious regarding mobility and capabilities; as well as breathing during ADL/mobility states she needs to "catch her breath"        Exercises      Shoulder Instructions       General Comments VSS on RA throughout    Pertinent Vitals/ Pain       Pain Assessment Pain Assessment: Faces Pain Score: 4  Faces Pain Scale: Hurts little more Pain Location: back and stomach Pain Descriptors / Indicators: Discomfort Pain Intervention(s): Monitored during session, Limited activity within patient's tolerance, Repositioned  Home Living                                          Prior Functioning/Environment              Frequency  Min 2X/week        Progress Toward Goals  OT Goals(current goals can now be found in the care plan section)  Progress towards OT goals: Progressing toward goals  Acute Rehab OT Goals Patient Stated Goal: to be continent OT Goal Formulation: With patient Time For Goal Achievement: 03/23/22 Potential to Achieve Goals: Good ADL Goals Pt Will Perform Eating: Independently;sitting Pt Will Perform Grooming: with min assist;standing Pt Will Perform Upper Body Bathing: with min assist;sitting Pt Will Perform Upper Body Dressing: with set-up;sitting Pt Will Transfer to Toilet: with min assist;ambulating;bedside commode Pt Will Perform Toileting -  Clothing Manipulation and hygiene: with min assist;sit to/from stand Additional ADL Goal #1: Pt will perform bed mobility with min assist in preparation for ADLs.  Plan Discharge plan remains appropriate;Frequency remains appropriate    Co-evaluation                 AM-PAC OT "6 Clicks" Daily Activity     Outcome Measure   Help from another person eating meals?: A Little Help from another person taking care of personal grooming?: A Little Help from another person toileting, which includes using toliet, bedpan, or urinal?: Total Help from another person bathing (including washing, rinsing, drying)?: A Lot Help from another person to put on and taking off regular upper body clothing?: A Lot Help from another person to put on and taking off regular lower body clothing?: A Lot 6 Click Score: 13    End of Session Equipment Utilized During Treatment: Rolling walker (2 wheels);Gait belt  OT Visit Diagnosis: Unsteadiness on feet (R26.81);Other abnormalities of gait and mobility (R26.89);Muscle weakness (generalized) (M62.81);Other symptoms and signs involving cognitive function;Pain   Activity Tolerance Patient limited by fatigue   Patient Left in chair;with call bell/phone within reach   Nurse Communication Mobility status        Time: 3846-6599 OT Time Calculation (min): 20 min  Charges: OT General Charges $OT Visit: 1 Visit OT Treatments $Self Care/Home Management : 8-22 mins  Renaye Rakers, OTD, OTR/L SecureChat Preferred Acute Rehab [phone] - [place] 03/19/2022, 4:24 PM

## 2022-03-20 ENCOUNTER — Inpatient Hospital Stay (HOSPITAL_COMMUNITY): Payer: Medicare Other

## 2022-03-20 DIAGNOSIS — K529 Noninfective gastroenteritis and colitis, unspecified: Secondary | ICD-10-CM | POA: Diagnosis not present

## 2022-03-20 DIAGNOSIS — A0472 Enterocolitis due to Clostridium difficile, not specified as recurrent: Secondary | ICD-10-CM | POA: Diagnosis not present

## 2022-03-20 DIAGNOSIS — K668 Other specified disorders of peritoneum: Secondary | ICD-10-CM

## 2022-03-20 DIAGNOSIS — K567 Ileus, unspecified: Secondary | ICD-10-CM | POA: Diagnosis not present

## 2022-03-20 LAB — CBC WITH DIFFERENTIAL/PLATELET
Abs Immature Granulocytes: 0.16 10*3/uL — ABNORMAL HIGH (ref 0.00–0.07)
Basophils Absolute: 0 10*3/uL (ref 0.0–0.1)
Basophils Relative: 0 %
Eosinophils Absolute: 0 10*3/uL (ref 0.0–0.5)
Eosinophils Relative: 0 %
HCT: 29.5 % — ABNORMAL LOW (ref 36.0–46.0)
Hemoglobin: 9.6 g/dL — ABNORMAL LOW (ref 12.0–15.0)
Immature Granulocytes: 1 %
Lymphocytes Relative: 9 %
Lymphs Abs: 1.4 10*3/uL (ref 0.7–4.0)
MCH: 30.4 pg (ref 26.0–34.0)
MCHC: 32.5 g/dL (ref 30.0–36.0)
MCV: 93.4 fL (ref 80.0–100.0)
Monocytes Absolute: 1.1 10*3/uL — ABNORMAL HIGH (ref 0.1–1.0)
Monocytes Relative: 7 %
Neutro Abs: 12.8 10*3/uL — ABNORMAL HIGH (ref 1.7–7.7)
Neutrophils Relative %: 83 %
Platelets: 465 10*3/uL — ABNORMAL HIGH (ref 150–400)
RBC: 3.16 MIL/uL — ABNORMAL LOW (ref 3.87–5.11)
RDW: 16.9 % — ABNORMAL HIGH (ref 11.5–15.5)
WBC: 15.5 10*3/uL — ABNORMAL HIGH (ref 4.0–10.5)
nRBC: 0 % (ref 0.0–0.2)

## 2022-03-20 LAB — BASIC METABOLIC PANEL
Anion gap: 10 (ref 5–15)
Anion gap: 7 (ref 5–15)
BUN: 36 mg/dL — ABNORMAL HIGH (ref 8–23)
BUN: 50 mg/dL — ABNORMAL HIGH (ref 8–23)
CO2: 28 mmol/L (ref 22–32)
CO2: 28 mmol/L (ref 22–32)
Calcium: 9.5 mg/dL (ref 8.9–10.3)
Calcium: 9.4 mg/dL (ref 8.9–10.3)
Chloride: 100 mmol/L (ref 98–111)
Chloride: 104 mmol/L (ref 98–111)
Creatinine, Ser: 0.3 mg/dL — ABNORMAL LOW (ref 0.44–1.00)
Creatinine, Ser: 0.42 mg/dL — ABNORMAL LOW (ref 0.44–1.00)
GFR, Estimated: >60 mL/min (ref >60)
GFR, Estimated: >60 mL/min (ref >60)
Glucose, Bld: 176 mg/dL — ABNORMAL HIGH (ref 70–99)
Glucose, Bld: 110 mg/dL — ABNORMAL HIGH (ref 70–99)
Potassium: 4.3 mmol/L (ref 3.5–5.1)
Potassium: 4.5 mmol/L (ref 3.5–5.1)
Sodium: 138 mmol/L (ref 135–145)
Sodium: 139 mmol/L (ref 135–145)

## 2022-03-20 LAB — GLUCOSE, CAPILLARY
Glucose-Capillary: 114 mg/dL — ABNORMAL HIGH (ref 70–99)
Glucose-Capillary: 123 mg/dL — ABNORMAL HIGH (ref 70–99)
Glucose-Capillary: 128 mg/dL — ABNORMAL HIGH (ref 70–99)
Glucose-Capillary: 156 mg/dL — ABNORMAL HIGH (ref 70–99)
Glucose-Capillary: 199 mg/dL — ABNORMAL HIGH (ref 70–99)
Glucose-Capillary: 258 mg/dL — ABNORMAL HIGH (ref 70–99)

## 2022-03-20 LAB — IRON AND TIBC
Iron: 51 ug/dL (ref 28–170)
Saturation Ratios: 16 % (ref 10.4–31.8)
TIBC: 328 ug/dL (ref 250–450)
UIBC: 277 ug/dL

## 2022-03-20 LAB — VITAMIN B12: Vitamin B-12: 1031 pg/mL — ABNORMAL HIGH (ref 180–914)

## 2022-03-20 LAB — FERRITIN: Ferritin: 132 ng/mL (ref 11–307)

## 2022-03-20 LAB — BRAIN NATRIURETIC PEPTIDE: B Natriuretic Peptide: 286.1 pg/mL — ABNORMAL HIGH (ref 0.0–100.0)

## 2022-03-20 MED ORDER — ALPRAZOLAM 0.25 MG PO TABS
0.2500 mg | ORAL_TABLET | Freq: Once | ORAL | Status: AC | PRN
  Administered 2022-03-20: 0.25 mg via ORAL
  Filled 2022-03-20: qty 1

## 2022-03-20 MED ORDER — TRACE MINERALS CU-MN-SE-ZN 300-55-60-3000 MCG/ML IV SOLN
INTRAVENOUS | Status: AC
  Filled 2022-03-20: qty 232

## 2022-03-20 MED ORDER — FUROSEMIDE 10 MG/ML IJ SOLN
20.0000 mg | Freq: Once | INTRAMUSCULAR | Status: AC
  Administered 2022-03-20: 20 mg via INTRAVENOUS

## 2022-03-20 MED ORDER — MEDIHONEY WOUND/BURN DRESSING EX PSTE
1.0000 | PASTE | Freq: Every day | CUTANEOUS | Status: DC
  Administered 2022-03-20 – 2022-03-30 (×11): 1 via TOPICAL
  Filled 2022-03-20 (×3): qty 44

## 2022-03-20 MED ORDER — FUROSEMIDE 10 MG/ML IJ SOLN: 40.0000 mg | Freq: Once | INTRAMUSCULAR | Status: DC

## 2022-03-20 MED ORDER — INSULIN ASPART 100 UNIT/ML IJ SOLN
0.0000 [IU] | INTRAMUSCULAR | Status: DC
  Administered 2022-03-20: 3 [IU] via SUBCUTANEOUS
  Administered 2022-03-20: 2 [IU] via SUBCUTANEOUS
  Administered 2022-03-20: 8 [IU] via SUBCUTANEOUS
  Administered 2022-03-21: 3 [IU] via SUBCUTANEOUS
  Administered 2022-03-21: 2 [IU] via SUBCUTANEOUS
  Administered 2022-03-21: 3 [IU] via SUBCUTANEOUS
  Administered 2022-03-22: 2 [IU] via SUBCUTANEOUS
  Administered 2022-03-22 (×2): 3 [IU] via SUBCUTANEOUS
  Administered 2022-03-22 – 2022-03-23 (×4): 2 [IU] via SUBCUTANEOUS
  Administered 2022-03-23: 3 [IU] via SUBCUTANEOUS
  Administered 2022-03-23: 15 [IU] via SUBCUTANEOUS
  Administered 2022-03-23 (×2): 3 [IU] via SUBCUTANEOUS

## 2022-03-20 MED ORDER — BUDESONIDE 3 MG PO CPEP
6.0000 mg | ORAL_CAPSULE | Freq: Every day | ORAL | Status: DC
  Administered 2022-03-20: 6 mg via ORAL
  Filled 2022-03-20 (×2): qty 2

## 2022-03-20 MED ORDER — ALUM & MAG HYDROXIDE-SIMETH 200-200-20 MG/5ML PO SUSP
30.0000 mL | Freq: Four times a day (QID) | ORAL | Status: DC | PRN
  Administered 2022-03-20: 30 mL via ORAL
  Filled 2022-03-20: qty 30

## 2022-03-20 NOTE — Consult Note (Addendum)
[place] Nurse wound follow up Refer to previous consult note on 11/10. Previously noted Deep tissue pressure injury has evolved to an Unstageable pressure injury to the sacrum.  100% tightly adhered yellow slough, small amt yellow drainage, 2X2cm Present on admission: No Dressing procedure/placement/frequency: Pt is on a low airloss mattress to reduce pressure.  Topical treatment orders provided for bedside nurses to perform as follows to assist with removal of nonviable tissue: Apply Medihoney to sacrum wound Q day, then cover with foam dressing.  (Change foam dressing Q 3 days or PRN soiling.) WOC team will reassess the location next week to determine if a change in the plan of care is indicated at that time. Thank-you,  Julien Girt MSN, [place], [place], [place], [place]

## 2022-03-20 NOTE — Evaluation (Signed)
Clinical/Bedside Swallow Evaluation Patient Details  Name: Julie Montgomery MRN: [id number] Date of Birth: February 09, 1936  Today's Date: 03/20/2022 Time: SLP Start Time (ACUTE ONLY): 0947 SLP Stop Time (ACUTE ONLY): 0957 SLP Time Calculation (min) (ACUTE ONLY): 10 min  Past Medical History:  Past Medical History:  Diagnosis Date   Adenomatous polyp of colon 2020   Allergy    Anxiety    Basal cell carcinoma (BCC)    Cataract    Depression    Helicobacter pylori gastritis    Osteopenia    Paroxysmal atrial fibrillation (HCC)    Pneumonia    hx of 03/2008   Stroke [facility])    TIA long time ago   Past Surgical History:  Past Surgical History:  Procedure Laterality Date   APPENDECTOMY     BIOPSY  03/15/2022   Procedure: BIOPSY;  Surgeon: Jerene Bears, MD;  Location: [facility] END[facility];  Service: Gastroenterology;;   BREAST LUMPECTOMY     right   CATARACT EXTRACTION W/ INTRAOCULAR LENS  IMPLANT, BILATERAL Bilateral    CESAREAN SECTION     CHOLECYSTECTOMY     FLEXIBLE SIGMOIDOSCOPY N/A 03/15/2022   Procedure: FLEXIBLE SIGMOIDOSCOPY;  Surgeon: Jerene Bears, MD;  Location: [facility];  Service: Gastroenterology;  Laterality: N/A;   MOHS SURGERY     PARTIAL HYSTERECTOMY     ROTATOR CUFF REPAIR  2012   SKIN GRAFT     teeth remmoved     HPI:  Pt is an 86 year old woman who came to the ED on11/06 with distended abdomen, LE swelling, poor PO intake with dehydration. She was discharged 4 days prior with c-diff. Dx c-diff and colonic ileus. TPN; recently advanced to dysphagia 3 diet.  PMH: CHF, dementia, PAF, chronic hyponatremia, anemia, TIA, osteopenia, severe protein calorie malnutrition, FTT.    Assessment / Plan / Recommendation  Clinical Impression  Pt participated in clinical swallowing evaluation. Oral mechanism exam was normal; wears dentures - she described them as fitting well. Just finished eating breakfast upon entering the room; her son, Julie Montgomery, was present and stated she ate  well. Pt accepted further sips of iced tea, bites of puree/soft solid.  There was thorough mastication, the appearance of a brisk swallow response, and no s/s of aspiration. She had some dyspnea while eating and took frequent rest breaks.  No indications of an oropharyngeal dysphagia are present. Recommend advancing diet to regular solids to allow more variety; continue thin liquids. Meds whole with liquid. No SLP f/u is needed. Our service will sign off. SLP Visit Diagnosis: Dysphagia, unspecified (R13.10)    Aspiration Risk  No limitations    Diet Recommendation   Regular solids, thin liquids  Medication Administration: Whole meds with liquid    Other  Recommendations Oral Care Recommendations: Oral care BID    Recommendations for follow up therapy are one component of a multi-disciplinary discharge planning process, led by the attending physician.  Recommendations may be updated based on patient status, additional functional criteria and insurance authorization.  Follow up Recommendations No SLP follow up        Swallow Study   General Date of Onset: 03/05/22 HPI: Pt is an 86 year old woman who came to the ED on11/06 with distended abdomen, LE swelling, poor PO intake with dehydration. She was discharged 4 days prior with c-diff. Dx c-diff and colonic ileus. TPN; recently advanced to dysphagia 3 diet.  PMH: CHF, dementia, PAF, chronic hyponatremia, anemia, TIA, osteopenia, severe protein calorie malnutrition,  FTT. Type of Study: Bedside Swallow Evaluation Previous Swallow Assessment: no Diet Prior to this Study: Dysphagia 3 (soft);Thin liquids Temperature Spikes Noted: No Respiratory Status: Room air History of Recent Intubation: No Behavior/Cognition: Alert Oral Cavity Assessment: Within Functional Limits Oral Care Completed by SLP: No Oral Cavity - Dentition: Dentures, top;Dentures, bottom Vision: Functional for self-feeding Self-Feeding Abilities: Able to feed self Patient  Positioning: Upright in bed Baseline Vocal Quality: Normal Volitional Cough: Strong Volitional Swallow: Able to elicit    Oral/Motor/Sensory Function Overall Oral Motor/Sensory Function: Within functional limits   Ice Chips Ice chips: Not tested   Thin Liquid Thin Liquid: Within functional limits    Nectar Thick Nectar Thick Liquid: Not tested   Honey Thick Honey Thick Liquid: Within functional limits   Puree Puree: Within functional limits   Solid     Solid: Within functional limits      Julie Montgomery 03/20/2022,10:10 AM  Estill Bamberg L. Tivis Ringer, MA CCC/SLP Clinical Specialist - [place] Office number [phone]

## 2022-03-20 NOTE — Progress Notes (Signed)
Patient was seen for severe upper abdominal pain and distension.   She is hemodynamically stable and pain now controlled with one dose of fentanyl. Abdomen is distended and most tender across the upper abd. Occasional high-pitched bowel sound heard.   KUB was obtained and concerning for new large amount of intraperitoneal air.   Discussed plan with RN, will keep her NPO, start Zosyn, remove rectal tube, and consult surgery.   Appreciate Dr. Thermon Leyland consulting on patient, will follow-up on recommendations.

## 2022-03-20 NOTE — Progress Notes (Signed)
Mobility Specialist - Progress Note   03/20/22 1540  Mobility  Activity Refused mobility   Pt refused mobility despite max encouragement. Will continue to follow  Franki Monte  Mobility Specialist Please contact via SecureChat or Rehab office at [phone]

## 2022-03-20 NOTE — Consult Note (Signed)
   [facility] CM Inpatient Consult   03/20/2022  LOREA KUPFER [birth year] [id number]  [facility] [ACO] Patient: Medicare ACO REACH  Follow up:  LLOS, readmission  Primary Care Provider:  Cassandria Anger, MD, [place] [facility] is listed to provide the transition of care follow up  Patient screened for ongoing length of stay readmission to assess for potential [place] Management service needs for post hospital transition for care coordination.  Review of patient's electronic medical record reveals patient remains on TPN, reviewed for post hospital needs. Met with patient and spouse at bedside to explain reason for rounding visit regarding PCP and insurance.  Patient states she also has [place] as well. PT/OT latest review reveals patient for home with home health currently for transitional follow up care.   Plan:  Continue to follow progress and disposition to assess for post hospital community care coordination/management needs.  Referral request for community care coordination:  ongoing medical teatment  Of note, [place] does not replace or interfere with any arrangements made by the Inpatient Transition of Care team.  For questions contact:   Natividad Brood, RN BSN [place]  [phone] business mobile phone Toll free office [phone]  *[place]  [phone] Fax number: [phone] [email or web address]

## 2022-03-20 NOTE — Progress Notes (Addendum)
PROGRESS NOTE    Julie Montgomery  BLT:[id number] DOB: 1935-11-23 DOA: 03/05/2022 PCP: Cassandria Anger, MD  Chief Complaint  Patient presents with   Fluid Retention    Brief Narrative:  Julie Montgomery is an 86 y.o. female with Marlee Armenteros history of dementia, PAF, recurrent C diff, and chronic hyponatremia who presented 11/6 with decreased oral intake. Recent hospitalization from 10/25 to 02/28/22 for recurrent C diff colitis. She was discharge on oral vancomycin taper. At home her diarrhea was improving, taking imodium, though oral intake was very poor. She appeared dehydrated on exam with abdominal tenderness.     Na 129, K 4,3 CL 94 bicarbonate 23, glucose 104 bun 16 cr 0,49  Wbc 13,5 hgb 11,4 plt 356    Abdominal radiograph with marked dilatation of the ascending colon with interval worsening. Mild dilatation of the small bowel loops. Findings consistent with ileus.    CT abdomen and pelvis with persistent and mildly progressive colonic distention with air fluid levels, decreased caliber distal colon at the level of the mid sigmoid colon and increase caliber proximal small bowel loops with the left hemi abdomen. Fecalization of the prominent small bowel loops.    Chest radiograph with hyperinflation and small left pleural effusion, no infiltrates.    She was admitted and treated for recurrent C. diff though work up more suggestive of colonization. ID recommended oral vancomycin with taper. ETEC + in stool, given Kyrell Ruacho dose of azithromycin. General surgery initially consulted, signed off. GI consulted, recommended TPN, and ultimately signed off having no further recommendations. The patient's abdominal distention has improved, stool output decreased, though she continues to have significant abdominal pain and require rectal tube. Per family request, GI is asked for any additional recommendations they may have. Flex sigmoidoscopy performed with biopsy showing nonspecific inflammation for which IV  solumedrol started.   Assessment & Plan:   Principal Problem:   Ileus ([place]) Active Problems:   Recurrent Clostridioides difficile infection   Dehydration   Paroxysmal atrial fibrillation (HCC)   Anemia of chronic disease   Protein calorie malnutrition (HCC)   Goals of care, counseling/discussion   C. difficile diarrhea   Protein-calorie malnutrition, severe   Abnormal x-ray of abdomen   Adynamic ileus (HCC)  PAF with RVR  Recurrent Pauses - afib with RVR overnight, had 6.76 second pause when converting back to sinus rhythm and then 5.51 second pause again converting back to sinus rhythm  - currently sinus rhythm  - diltiazem daily continuing - continue anticoagulation with eliquis - EP notes not Rage Beever candidate for permanent pacing, started amiodarone 200 mg daily in effort to maintain sinus rhythm and avoid post termination pauses - Continue cardiac monitoring.   Dyspnea  Third spacing, hypoalbuminemia, acute on chronic HFpEF:  - CXR with decreased interstitial markings in both lungs, small bilateral effusion - dyspnea seems to be out of proportion to exam/imaging findings (concerning for component of anxiety, will trial low dose xanax x1) -> will reevaluate, consider additional imaging (CT PE protocol? Low suspicion generally, but with her persistent c/o shortness of breath, if it does not improve with lasix and/or antianxiety, would follow CT PE as next step) - continue to diurese as tolerated - strict I/O, daily weights  Ileus: Favored Dx in setting of imodium over toxic megacolon at this time with relatively modest leukocytosis (normalized), nontoxic appearance and exam.  - 11/17 plain films without evidence of bowel obstruction  - Continue present management   Diarrhea, nonspecific colitis: Bx  at flex sig without evidence of IBD or microscopic colitis at this time.  - Follow final path report with GI path on 11/20.  -> colonic mucosa with nonspecific minimal changes of  chronicity - trial of budesonide per GI - Abd benign/slightly improved - Reinsert rectal tube.   Severe protein calorie malnutrition:  - Continue TPN, started 11/11 thru PICC placed 11/11.  - weaning TPN to half goal rate per pharmacy given her improvement in PO intake - labs ordered per RD -> vitamin C, zinc, b1, folate pending as of 11/20  ETEC:  - s/p azithromycin x1 11/10. Supportive care for this for now. No hematochezia.   Recurrent Clostridioides difficile infection: Repeat work up was negative here, suspect colonization.  - Completed vancomycin with taper per ID (final dose 11/15 pm), DC'ed flagyl per ID. No longer requires isolation. - Patient may benefit from outpatient FMT at tertiary care center after discharge.  ID submitted paperwork for approval for Vowst in conjunction with Dr. Hilarie Fredrickson of GI. No response on authorization at this time. This is an outpatient medication.    Acute urinary retention, pyuria: No UCx growth. Passed TOV 11/11.   Dehydration: Resolved.  - DC IVF with more edema (hypoalbuminemia and immobility also contributing) and TPN    Hypokalemia:  - Supplementing in TPN.     Hypomagnesemia:  - impoved   Hypophosphatemia:  - improved   Dementia: Appears mild. - Continue namenda.  - Family has been at bedside consistently.   Anemia of chronic disease: - No evidence of acute bleeding, monitor intermittently. - anemia labs   Vitamin D deficiency:  - Supplement 50k units weekly x8 weeks started 11/8 and recheck    DVT prophylaxis: eliquis Code Status: full Family Communication: son at bedside Disposition:   Status is: Inpatient Remains inpatient appropriate because: continued need for inpatient care   Consultants:  GI cardiology  Procedures:  Flex sig Impression - erythematous and congested mucosa in the sigmoid colon, biopsied to evaluate for colitis.  Moderate diverticulosis in the sigmoid colon.  Normal rectal mucosa with mild  trauma from rectal tube.  Recommendation - Return to hospital ward for ongoing care. ADAT, continue present medications.  Await pathology results.  Consider initiation of steroid therapy based on pathology results.  Vowst will also be considered based on these results.  Cholestyramine 4 g to start tonight.  GI service will follow.  Antimicrobials:  Anti-infectives (From admission, onward)    Start     Dose/Rate Route Frequency Ordered Stop   03/16/22 0800  vancomycin (VANCOCIN) capsule 125 mg  Status:  Discontinued       See Hyperspace for full Linked Orders Report.   125 mg Oral Daily with breakfast 03/06/22 0220 03/06/22 1240   03/09/22 2200  vancomycin (VANCOCIN) capsule 125 mg       Note to Pharmacy: 02/27/22 - 03/08/22: Take 1 capsule four times daily; 11/10- 11/16: take 1 capsule two times daily; 11/17 - 11/23: take 1 capsule once daily; from 11/24 and on, see other prescrption sent to [facility]     125 mg Oral 2 times daily 03/09/22 1635 03/14/22 2126   03/09/22 1730  azithromycin (ZITHROMAX) 1,000 mg in dextrose 5 % 250 mL IVPB        1,000 mg 260 mL/hr over 60 Minutes Intravenous Every 24 hours 03/09/22 1635 03/09/22 2024   03/09/22 0800  vancomycin (VANCOCIN) capsule 125 mg  Status:  Discontinued       See Hyperspace  for full Linked Orders Report.   125 mg Oral 2 times daily with meals 03/06/22 0220 03/06/22 1240   03/06/22 1800  vancomycin (VANCOCIN) 500 mg in sodium chloride irrigation 0.9 % 100 mL ENEMA  Status:  Discontinued        500 mg Rectal Every 6 hours 03/06/22 1429 03/06/22 1528   03/06/22 1800  vancomycin (VANCOCIN) capsule 125 mg       Note to Pharmacy: 02/27/22 - 03/08/22: Take 1 capsule four times daily; 11/10- 11/16: take 1 capsule two times daily; 11/17 - 11/23: take 1 capsule once daily; from 11/24 and on, see other prescrption [facility]     125 mg Oral Every 6 hours 03/06/22 1528 03/09/22 1300   03/06/22 1300  metroNIDAZOLE (FLAGYL) IVPB 500 mg  Status:  Discontinued         500 mg 100 mL/hr over 60 Minutes Intravenous Every 8 hours 03/06/22 1249 03/08/22 0838   03/06/22 0800  vancomycin (VANCOCIN) capsule 125 mg  Status:  Discontinued       Note to Pharmacy: 02/27/22 - 03/08/22: Take 1 capsule four times daily; 11/10- 11/16: take 1 capsule two times daily; 11/17 - 11/23: take 1 capsule once daily; from 11/24 and on, see other prescrption [facility]     125 mg Oral 3 times daily before meals & bedtime 03/06/22 0216 03/06/22 1240       Subjective: Complaints of shortness of breath  Objective: Vitals:   03/20/22 0412 03/20/22 0831 03/20/22 1249 03/20/22 1545  BP: (!) 151/137 137/65 129/69   Pulse: 87 84 91   Resp: 20 (!) 21 19   Temp: 97.8 F (36.6 C) 97.8 F (36.6 C) 98 F (36.7 C) 98.5 F (36.9 C)  TempSrc: Oral Oral Oral Oral  SpO2:  98% 98%   Weight:      Height:        Intake/Output Summary (Last 24 hours) at 03/20/2022 1830 Last data filed at 03/20/2022 1110 Gross per 24 hour  Intake 595.02 ml  Output 500 ml  Net 95.02 ml   Filed Weights   03/17/22 0446 03/18/22 0322 03/19/22 0416  Weight: 48.9 kg 49.4 kg 48.5 kg    Examination:  General: complaining of shortness of breath during my entire evaluation Cardiovascular: RRR Lungs: unlabored Abdomen: Soft, nontender, nondistended Neurological: Alert. Moves all extremities 4. Cranial nerves II through XII grossly intact. Extremities: LE edema almost resolved   Data Reviewed: I have personally reviewed following labs and imaging studies  CBC: Recent Labs  Lab 03/16/22 0550 03/17/22 0535 03/18/22 0250 03/19/22 0350 03/20/22 0508  WBC 11.9* 11.6* 16.7* 14.6* 15.5*  NEUTROABS 9.9* 10.6* 14.3* 12.1* 12.8*  HGB 8.7* 9.3* 9.1* 9.2* 9.6*  HCT 26.6* 27.8* 27.0* 28.1* 29.5*  MCV 90.2 90.8 91.2 93.0 93.4  PLT 192 262 334 397 465*    Basic Metabolic Panel: Recent Labs  Lab 03/14/22 0500 03/15/22 0450 03/16/22 0550 03/17/22 0535 03/18/22 0250 03/19/22 0350  03/20/22 0508  NA 129* 127* 131* 134* 137 138 138  K 4.3 5.0 4.7 4.7 4.1 4.3 4.3  CL 94* 94* 95* 97* 98 101 100  CO2 '29 27 28 29 29 30 28  '$ GLUCOSE 137* 134* 142* 181* 134* 119* 176*  BUN '17 15 16 20 '$ 30* 39* 36*  CREATININE <0.30* <0.30* 0.33* <0.30* 0.39* 0.47 0.30*  CALCIUM 8.1* 8.4* 9.0 9.5 9.3 9.1 9.5  MG 1.9 1.9 2.0 2.1  --  2.1  --  PHOS 2.9 3.0 3.2  --   --  4.0  --     GFR: Estimated Creatinine Clearance: 40.1 mL/min (Tyria Springer) (by C-G formula based on SCr of 0.3 mg/dL (L)).  Liver Function Tests: Recent Labs  Lab 03/15/22 0450 03/16/22 0550 03/19/22 0350  AST 21 21 38  ALT 19 21 50*  ALKPHOS 93 81 91  BILITOT <0.1* 0.2* 0.3  PROT 5.1* 5.5* 5.7*  ALBUMIN 2.1* 2.9* 2.8*    CBG: Recent Labs  Lab 03/20/22 0014 03/20/22 0410 03/20/22 0828 03/20/22 1251 03/20/22 1604  GLUCAP 128* 156* 114* 258* 199*     No results found for this or any previous visit (from the past 240 hour(s)).       Radiology Studies: DG CHEST PORT 1 VIEW  Result Date: 03/20/2022 CLINICAL DATA:  Shortness of breath EXAM: PORTABLE CHEST 1 VIEW COMPARISON:  Previous studies including the examination of 03/19/2022 FINDINGS: Cardiac size is within normal limits. Tip of right PICC line is seen in superior vena cava. There is interval decrease in interstitial markings in parahilar regions and lower lung fields suggesting further clearing of pulmonary edema. No new focal infiltrates are seen. Small bilateral pleural effusions are seen. There is no pneumothorax. Degenerative changes are noted in right shoulder. IMPRESSION: There is decrease in interstitial markings in both lungs suggesting clearing of pulmonary edema. No new focal infiltrates are seen. Small bilateral pleural effusions are seen. Electronically Signed   By: Elmer Picker M.D.   On: 03/20/2022 12:37   DG CHEST PORT 1 VIEW  Result Date: 03/19/2022 CLINICAL DATA:  Shortness of breath EXAM: PORTABLE CHEST 1 VIEW COMPARISON:   Radiograph 03/16/2022 FINDINGS: Right upper extremity PICC tip overlies the distal SVC. Unchanged size of the cardiomediastinal silhouette. There are mild interstitial opacities and central pulmonary vascular prominence, decreased from prior exam. Persistent small pleural effusions. No pneumothorax. No new airspace disease. Thoracic spondylosis. Bilateral shoulder degenerative changes. IMPRESSION: Improving interstitial pulmonary edema. Persistent small pleural effusions. Electronically Signed   By: Maurine Simmering M.D.   On: 03/19/2022 11:52        Scheduled Meds:  (feeding supplement) PROSource Plus  30 mL Oral TID BM   amiodarone  200 mg Oral Daily   apixaban  2.5 mg Oral BID   budesonide  6 mg Oral Daily   Chlorhexidine Gluconate Cloth  6 each Topical Daily   diltiazem  240 mg Oral Daily   feeding supplement  1 Container Oral TID BM   feeding supplement  237 mL Oral Q24H   fiber supplement (BANATROL TF)  60 mL Oral BID   furosemide  40 mg Intravenous Daily   furosemide  40 mg Intravenous Once   Gerhardt's butt cream   Topical BID   insulin aspart  0-15 Units Subcutaneous Q4H   leptospermum manuka honey  1 Application Topical Daily   lidocaine  1 patch Transdermal Q24H   memantine  5 mg Oral BID   sodium chloride flush  10-40 mL Intracatheter Q12H   Vitamin D (Ergocalciferol)  50,000 Units Oral Q7 days   Continuous Infusions:  TPN ADULT (ION) 25 mL/hr at 03/20/22 1802     LOS: 14 days    Time spent: over 30 min    Fayrene Helper, MD [facility]   To contact the attending provider between 7A-7P or the covering provider during after hours 7P-7A, please log into the web site [email or web address] and access using universal  password for that web site.  If you do not have the password, please call the hospital operator.  03/20/2022, 6:30 PM

## 2022-03-20 NOTE — Progress Notes (Signed)
PT Cancellation Note  Patient Details Name: Julie Montgomery MRN: [id number] DOB: 29-May-1935   Cancelled Treatment:    Reason Eval/Treat Not Completed: Fatigue/lethargy limiting ability to participate; pt adamantly declining OOB despite max encouragement from PT and husband. Will follow-up as schedule permits.  Mabeline Caras, PT, DPT Acute Rehabilitation Services  Personal: [facility] Office: [place] 03/20/2022, 12:23 PM

## 2022-03-20 NOTE — Progress Notes (Signed)
Physical Therapy Treatment Patient Details Name: Julie Montgomery MRN: [id number] DOB: 1936-01-17 Today's Date: 03/20/2022   History of Present Illness Pt is an 86 y.o. female admitted 03/05/22 with BLE swelling, abdominal distension, poor PO intake. Workup for suspected recurrent C-diff, colonic ileus, dehydration. Sigmoidoscopy 11/16 with findings of erythmatous and congested mucosa in sigmoid colon, diverticulosis. Afib with RVR 11/18. Of note, multiple recent admissions the past three months with c-diff symptoms. Other PMH includes CHF, PAF, anemia, osteopenia, TIA, chronic hyponatremia.    PT Comments    Pt very slowly progressing with mobility. Today's session focused on bed mobility and LE exercises in the bed. Pt persistent on not sitting EOB, however did demonstrate rolling to both sides maxA and forward trunk bending while repositioning in bed. Pt remains limited by generalized weakness, decreased activity tolerance, cognitive deficits, and impaired balance strategies/postural reactions. Continue to recommend acute PT services to maximize functional mobility and independence prior to d/c to HHPT with aide (will continue to follow acutely, may require altering d/c location to SNF pending activity tolerance).    Recommendations for follow up therapy are one component of a multi-disciplinary discharge planning process, led by the attending physician.  Recommendations may be updated based on patient status, additional functional criteria and insurance authorization.  Follow Up Recommendations  Home health PT (aide; vs SNF pending family support and activity tolerance)     Assistance Recommended at Discharge Frequent or constant Supervision/Assistance  Patient can return home with the following A little help with bathing/dressing/bathroom;Assistance with cooking/housework;Direct supervision/assist for medications management;Direct supervision/assist for financial management;Assist for  transportation;Help with stairs or ramp for entrance;Assistance with feeding;A lot of help with walking and/or transfers   Equipment Recommendations  None recommended by PT    Recommendations for Other Services       Precautions / Restrictions Precautions Precautions: Fall;Other (comment) Precaution Comments: flexiseal Restrictions Weight Bearing Restrictions: No     Mobility  Bed Mobility Overal bed mobility: Needs Assistance Bed Mobility: Rolling Rolling: Max assist         General bed mobility comments: consistent encouragement provided to get pt to engage in bed rolling and scooting sitting up in bed to reposition and reduce tailbone pain; up to maxA necessary with consistent cuing and redirecting to mobility task    Transfers                   General transfer comment: declined due to fatigue    Ambulation/Gait               General Gait Details: declined due to fatigue   Stairs             Wheelchair Mobility    Modified Rankin (Stroke Patients Only)       Balance Overall balance assessment: Needs assistance   Sitting balance-Leahy Scale: Poor Sitting balance - Comments: refused sitting EOB, did demonstrate dynamic sitting balance by flexing trunk fwd to reposition pillows along neck/back                                    Cognition Arousal/Alertness: Awake/alert Behavior During Therapy: Anxious Overall Cognitive Status: History of cognitive impairments - at baseline Area of Impairment: Memory, Problem solving, Awareness, Following commands, Safety/judgement, Attention                   Current Attention Level: Sustained Memory: Decreased short-term memory  Following Commands: Follows one step commands with increased time, Follows one step commands inconsistently Safety/Judgement: Decreased awareness of deficits, Decreased awareness of safety Awareness: Intellectual Problem Solving: Slow processing,  Difficulty sequencing, Requires verbal cues, Decreased initiation General Comments: cognitive impairments at baseline, pt with STM challenges with asking same question even when provided with answer less than 30 seconds prior        Exercises General Exercises - Lower Extremity Heel Slides: AAROM, Both, 10 reps Straight Leg Raises: AAROM, Both, 10 reps    General Comments General comments (skin integrity, edema, etc.): VSS on 2L (SpO2 >95% but continued to endorse feeling the "air was hard to breath on")      Pertinent Vitals/Pain Pain Assessment Faces Pain Scale: Hurts even more Pain Location: tailbone Pain Descriptors / Indicators: Sore, Nagging Pain Intervention(s): Monitored during session, Repositioned    Home Living                          Prior Function            PT Goals (current goals can now be found in the care plan section) Acute Rehab PT Goals Patient Stated Goal: to go home PT Goal Formulation: With patient Time For Goal Achievement: 03/23/22 Potential to Achieve Goals: Fair Progress towards PT goals: Progressing toward goals    Frequency    Min 3X/week      PT Plan Current plan remains appropriate    Co-evaluation              AM-PAC PT "6 Clicks" Mobility   Outcome Measure  Help needed turning from your back to your side while in a flat bed without using bedrails?: A Lot Help needed moving from lying on your back to sitting on the side of a flat bed without using bedrails?: Total Help needed moving to and from a bed to a chair (including a wheelchair)?: Total Help needed standing up from a chair using your arms (e.g., wheelchair or bedside chair)?: Total Help needed to walk in hospital room?: Total Help needed climbing 3-5 steps with a railing? : Total 6 Click Score: 7    End of Session Equipment Utilized During Treatment: Oxygen Activity Tolerance: Patient limited by fatigue Patient left: in bed;with call bell/phone  within reach Nurse Communication: Mobility status;Other (comment) (Pt report of feeling hot) PT Visit Diagnosis: Unsteadiness on feet (R26.81);Muscle weakness (generalized) (M62.81);Difficulty in walking, not elsewhere classified (R26.2);Pain     Time: 7371-0626 PT Time Calculation (min) (ACUTE ONLY): 13 min  Charges:  $Therapeutic Activity: 8-22 mins                     Chipper Oman, SPT    Julie Montgomery 03/20/2022, 3:52 PM

## 2022-03-20 NOTE — Progress Notes (Signed)
PHARMACY - TOTAL PARENTERAL NUTRITION CONSULT NOTE  Indication:  prolonged hospitalization with severe malnutrition/anasarca  Patient Measurements: Height: '5\' 2"'$  (157.5 cm) Weight: 48.5 kg (107 lb) IBW/kg (Calculated) : 50.1 TPN AdjBW (KG): 41.3 Body mass index is 19.57 kg/m. Usual Weight: 49.5 kg  Assessment:  [name] with malnutrition d/t multiple factors including dementia, recurrent C diff with multiple admissions and GI intolerance, and difficulty chewing with ill-fitting dentures. Pt had poor PO intake prior to admit and reports nut allergy and lactose intolerance. Pharmacy consulted for TPN in setting of malnutrition and inability to meet needs with PO intake.    Patient has reported nut allergy (peanuts) - low risk of cross reactivity with SMOFlipid ingredient. Upon asking for rxn/details, pt and pt spouse do not remember pt having a nut allergy.  Have been tolerating SMOFlipid.  Glucose / Insulin: A1c 5.4% - CBGs elevated due to steroid 11/18, now variable and correlating with PO intake SM '20mg'$  daily started 11/17 PM for colitis Used 18 units SSI in past 24 hrs, better control, only 1x > 180  Electrolytes: all WNL, CoCa: 10.5 Renal: SCr < 1, BUN WNL Hepatic: LFTs / tbili / TG WNL, albumin 2.8 Intake / Output; MIVF: UOP undocumented, patient received '40mg'$  IV lasix on 11/20 PM, Previously on '80mg'$  IV daily, '40mg'$  IV lasix given on 11/21, LBM 11/20, rectal tube re-inserted 11/19   GI Imaging: 11/6 CTAP: persistent colonic distention , cannot exclude toxic megacolon 11/9 Ab Xray: decrease in gas distention, improved ileus  11/10 Ab Xray: improvement in gaseous distention 11/12 Ab Xray: no bowel dilation to suggest obstruction/ileus 11/16 Flex sigmoidoscopy: colon bx, mod diverticulosis 1/17 Abd XR: no bowel obstruction GI Surgeries / Procedures: none since TPN initiation   Central access: PICC placed 03/10/22 TPN start date: 03/10/2022  Nutritional Goals: RD Estimated  Needs Total Energy Estimated Needs: 1300-1500 Total Protein Estimated Needs: 65-80g Total Fluid Estimated Needs: 1.3-1.5L  Current Nutrition:  TPN Dysphagia 3 diet on 11/16 - calorie count with RD, per RD patient has made improvement with PO intake and is now meeting calorie needs with PO intake and supplements  Boost TID - 3x dose charted Prosource TID - 3x doses charted Ensure Enlive daily - 1 charted given  Plan:  Wean concentrated TPN to half goal rate 25 mL/hr to provide ~50% of needs (726.4 kCal and 34.8g AA) Electrolytes in TPN: Na 12mq/L, increased K to 733m/L (will provide a total of 4240mpotassium, similar to yesterday's TPN content of 2m96mince patient received lasix this morning) , Ca 3mEq33m Mg 13mEq24mPhos 30mmol48mmax CL Trend K with diuresis, with intermittent diuresis can repeat outside TPN as needed.  Add standard MVI and trace elements to TPN Will decrease to moderate SSI, patient with very liable blood sugars, anticipate will trend down as TPN is weaned today.  Monitor TPN labs on Mon/Thurs. Recheck BMP 11/22 given diuresis and TPN wean.   HeatherEsmeralda ArthurD  03/20/2022, 7:27 AM

## 2022-03-20 NOTE — Progress Notes (Signed)
Calorie Count Note- Day 2  48 hour calorie count ordered.  Diet: dysphagia 3, thin liquids Supplements: Boost Breeze TID, ProSource Plus TID, Ensure Plus High Protein once daily  Lunch: 20 kcal and 2g protein Dinner: 58 kcal and 4g protein Breakfast: 556 kcal and 5g protein Supplements: 2 ProSource Plus, 3 Boost Breeze, 1 Ensure (1300 kcal and 77g protein)  Total intake: 1934 kcal (149% of minimum estimated needs)  88g protein (135% of minimum estimated needs)  Nutrition Dx: Severe Malnutrition related to chronic illness (recurrent c. diff) as evidenced by severe fat depletion, severe muscle depletion.    Goal: Patient will meet greater than or equal to 90% of their needs    Intervention:  TPN management per pharmacy Continue Boost Breeze po TID, each supplement provides 250 kcal and 9 grams of protein 30 ml ProSource Plus TID, each supplement provides 100 kcals and 15 grams protein.  Ensure Plus High Protein 1x/d to provide 350kcal and 20g of protein Banatrol BID-provides 45kcal, 5g soluble fiber and 2g protein per serving.  Clayborne Dana, RDN, LDN Clinical Nutrition

## 2022-03-20 NOTE — Progress Notes (Addendum)
Daily Rounding Note  03/20/2022, 8:31 AM  LOS: 14 days   SUBJECTIVE:   Chief complaint:  Diarrhea.  Recurrent bouts C diff.      Ambulating w assists in hall.  Eating better.  Flexiseal in place, stool soft, less watery.  Occasional abd bloating and discomfort but not now.  No nausea.   No volume stools recorded for yesterday, 500 mL entered for 11/19.    OBJECTIVE:         Vital signs in last 24 hours:    Temp:  [97.8 F (36.6 C)-98.3 F (36.8 C)] 97.8 F (36.6 C) (11/21 0412) Pulse Rate:  [79-94] 87 (11/21 0412) Resp:  [18-20] 20 (11/21 0412) BP: (123-151)/(66-137) 151/137 (11/21 0412) SpO2:  [96 %-99 %] 96 % (11/20 1225) Last BM Date : 03/19/22 Filed Weights   03/17/22 0446 03/18/22 0322 03/19/22 0416  Weight: 48.9 kg 49.4 kg 48.5 kg   General: frail but overall less so than prior weeks.  Alert.  comfortable   Heart: RRR Chest: clear Abdomen: soft, slight protuberance.  NT.  BS active.  Soft, pasty/pudding consistency brwon stool in flexi tubing, no stool in the collection bag  Extremities: decreased LE, UE edema.   Neuro/Psych:  pleasant, alert, not confused.  No tremors or gross deficits.    Intake/Output from previous day: 11/20 0701 - 11/21 0700 In: 1665.4 [I.V.:1665.4] Out: -   Intake/Output this shift: No intake/output data recorded.  Lab Results: Recent Labs    03/18/22 0250 03/19/22 0350 03/20/22 0508  WBC 16.7* 14.6* 15.5*  HGB 9.1* 9.2* 9.6*  HCT 27.0* 28.1* 29.5*  PLT 334 397 465*   BMET Recent Labs    03/18/22 0250 03/19/22 0350 03/20/22 0508  NA 137 138 138  K 4.1 4.3 4.3  CL 98 101 100  CO2 '29 30 28  '$ GLUCOSE 134* 119* 176*  BUN 30* 39* 36*  CREATININE 0.39* 0.47 0.30*  CALCIUM 9.3 9.1 9.5   LFT Recent Labs    03/19/22 0350  PROT 5.7*  ALBUMIN 2.8*  AST 38  ALT 50*  ALKPHOS 91  BILITOT 0.3   PT/INR No results for input(s): "LABPROT", "INR" in the last 72  hours. Hepatitis Panel No results for input(s): "HEPBSAG", "HCVAB", "HEPAIGM", "HEPBIGM" in the last 72 hours.  Studies/Results: DG CHEST PORT 1 VIEW  Result Date: 03/19/2022 CLINICAL DATA:  Shortness of breath EXAM: PORTABLE CHEST 1 VIEW COMPARISON:  Radiograph 03/16/2022 FINDINGS: Right upper extremity PICC tip overlies the distal SVC. Unchanged size of the cardiomediastinal silhouette. There are mild interstitial opacities and central pulmonary vascular prominence, decreased from prior exam. Persistent small pleural effusions. No pneumothorax. No new airspace disease. Thoracic spondylosis. Bilateral shoulder degenerative changes. IMPRESSION: Improving interstitial pulmonary edema. Persistent small pleural effusions. Electronically Signed   By: Maurine Simmering M.D.   On: 03/19/2022 11:52    Scheduled Meds:  (feeding supplement) PROSource Plus  30 mL Oral TID BM   amiodarone  200 mg Oral Daily   apixaban  2.5 mg Oral BID   Chlorhexidine Gluconate Cloth  6 each Topical Daily   diltiazem  240 mg Oral Daily   feeding supplement  1 Container Oral TID BM   feeding supplement  237 mL Oral Q24H   fiber supplement (BANATROL TF)  60 mL Oral BID   furosemide  40 mg Intravenous Daily   Gerhardt's butt cream   Topical BID   insulin aspart  0-20  Units Subcutaneous Q4H   leptospermum manuka honey  1 Application Topical Daily   lidocaine  1 patch Transdermal Q24H   memantine  5 mg Oral BID   methylPREDNISolone (SOLU-MEDROL) injection  20 mg Intravenous Daily   sodium chloride flush  10-40 mL Intracatheter Q12H   Vitamin D (Ergocalciferol)  50,000 Units Oral Q7 days   Continuous Infusions:  TPN ADULT (ION) 50 mL/hr at 03/20/22 0600   PRN Meds:.acetaminophen **OR** acetaminophen, fentaNYL (SUBLIMAZE) injection, naLOXone (NARCAN)  injection, ondansetron (ZOFRAN) IV, sodium chloride flush   ASSESMENT:   Diarrhea.  Recurrent.  Multiple courses of treatment for C. difficile.  Most recent C. difficile  testing consistent with C. difficile colonization.  course Vanco completed 11/16.  1 dose of azithromycin for stool PCR detection of ETEC.  elevated CRP Flex sig 11/16: Erythematous and congested mucosa in the sigmoid colon, no pseudomembranes. Path: non-specific minor changes of chronicity, no changes of IBD.  Sigmoid tics.  Mild rectal trauma from rectal tube.  Dr Hilarie Fredrickson is working on approval for [employer].  Pt needs to be outpt for this med.     Severe protein calorie malnutrition.  TPN ongoing.  Diet advanced from clears to soft on 11/14.  Not ready to wean off TPN per RD.  Cal count in progress.  Albumin improved.        DM2.  Insulin in place.  Hemoglobin A1c reflects average blood glucose 108.  Outpatient diabetic meds.      FTT, weakness in setting of chronic illness and malnutrition     Chronic [place].  Not on hold.      Elevated blood glucose in setting of TPN.  SS Insulin in place.     [place] anemia.  Hgb stable.   Iron, b12, folate, ferritin ok  PLAN     Continue current therapies.  She is improving.  If able to get off TNA, can then discharge home.  Outpt Vowst administration in works.  Will check on pt later thei week, call if questions in the interim.      Azucena Freed  03/20/2022, 8:31 AM Phone [phone]  I have taken an interval history, thoroughly reviewed the chart and examined the patient. I agree with the Advanced Practitioner's note, impression and recommendations, and have recorded additional findings, impressions and recommendations below. I performed a substantive portion of this encounter (>50% time spent), including a complete performance of the medical decision making.  My additional thoughts are as follows:  Signout received from Dr. Hilarie Fredrickson and extensive chart review performed on this medically complex patient.  Protracted course with months of recurrent C. difficile malnutrition.  Most recent testing negative after another round of vancomycin, initial  colonic ileus resolved.  Flexi-Seal in place and volume of stool output reportedly slowly decreasing.  She has generalized abdominal pain and mild tenderness with good active bowel sounds.  Her appetite is slowly improving and she remains on TPN.  Severe hypoalbuminemia has probably been causing bowel edema and malabsorption.  She was started on empiric Solu-Medrol last week for the possibility of microscopic colitis, though left colon biopsies with minimal inflammatory changes consistent with months of recurrent infection. It is possible there could be more specific changes of microscopic colitis in the more proximal colon, so a trial of budesonide (Entocort) is reasonable.  I would discontinue the Solu-Medrol because it is immunosuppressive and patient recovering from recurrent infection.  Outpatient approval for Vowst is in the works.  We will follow-up  when we have more information about that.  Continue other supportive care in the meantime.   [name] III Office:[phone]

## 2022-03-21 ENCOUNTER — Inpatient Hospital Stay (HOSPITAL_COMMUNITY): Payer: Medicare Other | Admitting: Anesthesiology

## 2022-03-21 ENCOUNTER — Encounter (HOSPITAL_COMMUNITY)
Admission: EM | Disposition: A | Discharge status: Home Health | Payer: Self-pay | Source: Home / Self Care | Attending: Family Medicine

## 2022-03-21 ENCOUNTER — Encounter (HOSPITAL_COMMUNITY): Payer: Self-pay | Admitting: Internal Medicine

## 2022-03-21 DIAGNOSIS — Z8673 Personal history of transient ischemic attack (TIA), and cerebral infarction without residual deficits: Secondary | ICD-10-CM

## 2022-03-21 DIAGNOSIS — E43 Unspecified severe protein-calorie malnutrition: Secondary | ICD-10-CM

## 2022-03-21 DIAGNOSIS — K5939 Other megacolon: Secondary | ICD-10-CM

## 2022-03-21 DIAGNOSIS — Z9911 Dependence on respirator [ventilator] status: Secondary | ICD-10-CM | POA: Diagnosis not present

## 2022-03-21 DIAGNOSIS — Z9181 History of falling: Secondary | ICD-10-CM

## 2022-03-21 DIAGNOSIS — I700 Atherosclerosis of aorta: Secondary | ICD-10-CM

## 2022-03-21 DIAGNOSIS — K668 Other specified disorders of peritoneum: Secondary | ICD-10-CM

## 2022-03-21 DIAGNOSIS — F0390 Unspecified dementia without behavioral disturbance: Secondary | ICD-10-CM | POA: Diagnosis not present

## 2022-03-21 DIAGNOSIS — R579 Shock, unspecified: Secondary | ICD-10-CM | POA: Diagnosis not present

## 2022-03-21 DIAGNOSIS — K449 Diaphragmatic hernia without obstruction or gangrene: Secondary | ICD-10-CM

## 2022-03-21 DIAGNOSIS — Z85828 Personal history of other malignant neoplasm of skin: Secondary | ICD-10-CM

## 2022-03-21 DIAGNOSIS — Z7901 Long term (current) use of anticoagulants: Secondary | ICD-10-CM

## 2022-03-21 DIAGNOSIS — I728 Aneurysm of other specified arteries: Secondary | ICD-10-CM

## 2022-03-21 DIAGNOSIS — I480 Paroxysmal atrial fibrillation: Secondary | ICD-10-CM | POA: Diagnosis not present

## 2022-03-21 DIAGNOSIS — K567 Ileus, unspecified: Secondary | ICD-10-CM | POA: Diagnosis not present

## 2022-03-21 DIAGNOSIS — I2510 Atherosclerotic heart disease of native coronary artery without angina pectoris: Secondary | ICD-10-CM | POA: Diagnosis not present

## 2022-03-21 DIAGNOSIS — A0471 Enterocolitis due to Clostridium difficile, recurrent: Secondary | ICD-10-CM | POA: Diagnosis not present

## 2022-03-21 HISTORY — PX: LAPAROTOMY: SHX154

## 2022-03-21 LAB — COMPREHENSIVE METABOLIC PANEL
ALT: 62 U/L — ABNORMAL HIGH (ref 0–44)
AST: 37 U/L (ref 15–41)
Albumin: 3.3 g/dL — ABNORMAL LOW (ref 3.5–5.0)
Alkaline Phosphatase: 77 U/L (ref 38–126)
Anion gap: 8 (ref 5–15)
BUN: 48 mg/dL — ABNORMAL HIGH (ref 8–23)
CO2: 26 mmol/L (ref 22–32)
Calcium: 9.4 mg/dL (ref 8.9–10.3)
Chloride: 104 mmol/L (ref 98–111)
Creatinine, Ser: 0.57 mg/dL (ref 0.44–1.00)
GFR, Estimated: >60 mL/min (ref >60)
Glucose, Bld: 177 mg/dL — ABNORMAL HIGH (ref 70–99)
Potassium: 4.7 mmol/L (ref 3.5–5.1)
Sodium: 138 mmol/L (ref 135–145)
Total Bilirubin: 0.5 mg/dL (ref 0.3–1.2)
Total Protein: 5.7 g/dL — ABNORMAL LOW (ref 6.5–8.1)

## 2022-03-21 LAB — CBC WITH DIFFERENTIAL/PLATELET
Abs Immature Granulocytes: 0 10*3/uL (ref 0.00–0.07)
Basophils Absolute: 0.4 10*3/uL — ABNORMAL HIGH (ref 0.0–0.1)
Basophils Relative: 1 %
Eosinophils Absolute: 0 10*3/uL (ref 0.0–0.5)
Eosinophils Relative: 0 %
HCT: 30.2 % — ABNORMAL LOW (ref 36.0–46.0)
Hemoglobin: 9.8 g/dL — ABNORMAL LOW (ref 12.0–15.0)
Lymphocytes Relative: 4 %
Lymphs Abs: 1.7 10*3/uL (ref 0.7–4.0)
MCH: 30.6 pg (ref 26.0–34.0)
MCHC: 32.5 g/dL (ref 30.0–36.0)
MCV: 94.4 fL (ref 80.0–100.0)
Monocytes Absolute: 0.8 10*3/uL (ref 0.1–1.0)
Monocytes Relative: 2 %
Neutro Abs: 38.6 10*3/uL — ABNORMAL HIGH (ref 1.7–7.7)
Neutrophils Relative %: 93 %
Platelets: 519 10*3/uL — ABNORMAL HIGH (ref 150–400)
RBC: 3.2 MIL/uL — ABNORMAL LOW (ref 3.87–5.11)
RDW: 17.2 % — ABNORMAL HIGH (ref 11.5–15.5)
WBC: 41.5 10*3/uL — ABNORMAL HIGH (ref 4.0–10.5)
nRBC: 0 % (ref 0.0–0.2)
nRBC: 0 /100 WBC

## 2022-03-21 LAB — TYPE AND SCREEN
ABO/RH(D): A POS
Antibody Screen: NEGATIVE

## 2022-03-21 LAB — POCT I-STAT 7, (LYTES, BLD GAS, ICA,H+H)
Acid-Base Excess: 2 mmol/L (ref 0.0–2.0)
Bicarbonate: 28.9 mmol/L — ABNORMAL HIGH (ref 20.0–28.0)
Calcium, Ion: 1.31 mmol/L (ref 1.15–1.40)
HCT: 29 % — ABNORMAL LOW (ref 36.0–46.0)
Hemoglobin: 9.9 g/dL — ABNORMAL LOW (ref 12.0–15.0)
O2 Saturation: 99 %
Patient temperature: 98.8
Potassium: 4.5 mmol/L (ref 3.5–5.1)
Sodium: 139 mmol/L (ref 135–145)
TCO2: 31 mmol/L (ref 22–32)
pCO2 arterial: 58.2 mmHg — ABNORMAL HIGH (ref 32–48)
pH, Arterial: 7.304 — ABNORMAL LOW (ref 7.35–7.45)
pO2, Arterial: 134 mmHg — ABNORMAL HIGH (ref 83–108)

## 2022-03-21 LAB — PHOSPHORUS: Phosphorus: 4 mg/dL (ref 2.5–4.6)

## 2022-03-21 LAB — BASIC METABOLIC PANEL
Anion gap: 6 (ref 5–15)
BUN: 35 mg/dL — ABNORMAL HIGH (ref 8–23)
CO2: 28 mmol/L (ref 22–32)
Calcium: 8.8 mg/dL — ABNORMAL LOW (ref 8.9–10.3)
Chloride: 108 mmol/L (ref 98–111)
Creatinine, Ser: 0.53 mg/dL (ref 0.44–1.00)
GFR, Estimated: >60 mL/min (ref >60)
Glucose, Bld: 220 mg/dL — ABNORMAL HIGH (ref 70–99)
Potassium: 4.5 mmol/L (ref 3.5–5.1)
Sodium: 142 mmol/L (ref 135–145)

## 2022-03-21 LAB — GLUCOSE, CAPILLARY
Glucose-Capillary: 120 mg/dL — ABNORMAL HIGH (ref 70–99)
Glucose-Capillary: 130 mg/dL — ABNORMAL HIGH (ref 70–99)
Glucose-Capillary: 150 mg/dL — ABNORMAL HIGH (ref 70–99)
Glucose-Capillary: 167 mg/dL — ABNORMAL HIGH (ref 70–99)
Glucose-Capillary: 167 mg/dL — ABNORMAL HIGH (ref 70–99)
Glucose-Capillary: 171 mg/dL — ABNORMAL HIGH (ref 70–99)
Glucose-Capillary: 178 mg/dL — ABNORMAL HIGH (ref 70–99)
Glucose-Capillary: 75 mg/dL (ref 70–99)

## 2022-03-21 LAB — APTT: aPTT: 24 seconds (ref 24–36)

## 2022-03-21 LAB — MAGNESIUM
Magnesium: 2.1 mg/dL (ref 1.7–2.4)
Magnesium: 2.1 mg/dL (ref 1.7–2.4)

## 2022-03-21 LAB — HEPARIN LEVEL (UNFRACTIONATED): Heparin Unfractionated: >1.1 IU/mL — ABNORMAL HIGH (ref 0.30–0.70)

## 2022-03-21 LAB — BRAIN NATRIURETIC PEPTIDE: B Natriuretic Peptide: 117.3 pg/mL — ABNORMAL HIGH (ref 0.0–100.0)

## 2022-03-21 LAB — PROTIME-INR
INR: 1.1 (ref 0.8–1.2)
Prothrombin Time: 14.5 seconds (ref 11.4–15.2)

## 2022-03-21 LAB — ABO/RH: ABO/RH(D): A POS

## 2022-03-21 SURGERY — LAPAROTOMY, EXPLORATORY
Anesthesia: General

## 2022-03-21 MED ORDER — ONDANSETRON HCL 4 MG/2ML IJ SOLN
INTRAMUSCULAR | Status: AC
  Filled 2022-03-21: qty 2

## 2022-03-21 MED ORDER — AMIODARONE HCL 200 MG PO TABS
200.0000 mg | ORAL_TABLET | Freq: Every day | ORAL | Status: DC
  Administered 2022-03-21: 200 mg
  Filled 2022-03-21: qty 1

## 2022-03-21 MED ORDER — ONDANSETRON HCL 4 MG/2ML IJ SOLN
INTRAMUSCULAR | Status: DC | PRN
  Administered 2022-03-21: 4 mg via INTRAVENOUS

## 2022-03-21 MED ORDER — MEMANTINE HCL 5 MG PO TABS: 5.0000 mg | ORAL_TABLET | Freq: Two times a day (BID) | ORAL | Status: DC

## 2022-03-21 MED ORDER — AMIODARONE HCL 200 MG PO TABS: 200.0000 mg | ORAL_TABLET | Freq: Every day | ORAL | Status: DC

## 2022-03-21 MED ORDER — 0.9 % SODIUM CHLORIDE (POUR BTL) OPTIME
TOPICAL | Status: DC | PRN
  Administered 2022-03-21: 1000 mL

## 2022-03-21 MED ORDER — PROPOFOL 10 MG/ML IV BOLUS
INTRAVENOUS | Status: AC
  Filled 2022-03-21: qty 20

## 2022-03-21 MED ORDER — ROCURONIUM BROMIDE 100 MG/10ML IV SOLN
INTRAVENOUS | Status: DC | PRN
  Administered 2022-03-21: 30 mg via INTRAVENOUS
  Administered 2022-03-21: 10 mg via INTRAVENOUS

## 2022-03-21 MED ORDER — AMIODARONE LOAD VIA INFUSION
150.0000 mg | Freq: Once | INTRAVENOUS | Status: AC
  Administered 2022-03-21: 150 mg via INTRAVENOUS
  Filled 2022-03-21: qty 83.34

## 2022-03-21 MED ORDER — EPHEDRINE 5 MG/ML INJ
INTRAVENOUS | Status: AC
  Filled 2022-03-21: qty 5

## 2022-03-21 MED ORDER — SUCCINYLCHOLINE CHLORIDE 20 MG/ML IJ SOLN
INTRAMUSCULAR | Status: DC | PRN
  Administered 2022-03-21: 100 mg via INTRAVENOUS

## 2022-03-21 MED ORDER — FENTANYL CITRATE (PF) 250 MCG/5ML IJ SOLN
INTRAMUSCULAR | Status: AC
  Filled 2022-03-21: qty 5

## 2022-03-21 MED ORDER — PROTHROMBIN COMPLEX CONC HUMAN 500 UNITS IV KIT
2618.0000 [IU] | PACK | Status: AC
  Administered 2022-03-21: 2618 [IU] via INTRAVENOUS
  Filled 2022-03-21: qty 2106

## 2022-03-21 MED ORDER — HEPARIN SODIUM (PORCINE) 5000 UNIT/ML IJ SOLN
5000.0000 [IU] | Freq: Three times a day (TID) | INTRAMUSCULAR | Status: DC
  Administered 2022-03-21: 5000 [IU] via SUBCUTANEOUS
  Filled 2022-03-21: qty 1

## 2022-03-21 MED ORDER — AMIODARONE HCL IN DEXTROSE 360-4.14 MG/200ML-% IV SOLN
60.0000 mg/h | INTRAVENOUS | Status: DC
  Administered 2022-03-21: 60 mg/h via INTRAVENOUS
  Filled 2022-03-21: qty 200

## 2022-03-21 MED ORDER — LACTATED RINGERS IV BOLUS
1000.0000 mL | Freq: Once | INTRAVENOUS | Status: AC
  Administered 2022-03-21: 1000 mL via INTRAVENOUS

## 2022-03-21 MED ORDER — AMIODARONE HCL IN DEXTROSE 360-4.14 MG/200ML-% IV SOLN
30.0000 mg/h | INTRAVENOUS | Status: DC
  Administered 2022-03-22 – 2022-03-23 (×3): 30 mg/h via INTRAVENOUS
  Filled 2022-03-21 (×3): qty 200

## 2022-03-21 MED ORDER — LIDOCAINE 2% (20 MG/ML) 5 ML SYRINGE
INTRAMUSCULAR | Status: AC
  Filled 2022-03-21: qty 5

## 2022-03-21 MED ORDER — MEMANTINE HCL 5 MG PO TABS
5.0000 mg | ORAL_TABLET | Freq: Two times a day (BID) | ORAL | Status: DC
  Administered 2022-03-21 (×2): 5 mg
  Filled 2022-03-21 (×4): qty 1

## 2022-03-21 MED ORDER — PHENYLEPHRINE HCL-NACL 20-0.9 MG/250ML-% IV SOLN
INTRAVENOUS | Status: AC
  Administered 2022-03-21: 60 ug/min via INTRAVENOUS
  Filled 2022-03-21: qty 250

## 2022-03-21 MED ORDER — PHENYLEPHRINE HCL-NACL 20-0.9 MG/250ML-% IV SOLN
INTRAVENOUS | Status: DC | PRN
  Administered 2022-03-21: 50 ug/min via INTRAVENOUS

## 2022-03-21 MED ORDER — CHARCOAL ACTIVATED PO LIQD: 50.0000 g | ORAL | Status: DC

## 2022-03-21 MED ORDER — PHENYLEPHRINE HCL-NACL 20-0.9 MG/250ML-% IV SOLN
0.0000 ug/min | INTRAVENOUS | Status: DC
  Administered 2022-03-21: 60 ug/min via INTRAVENOUS
  Administered 2022-03-22: 20 ug/min via INTRAVENOUS
  Filled 2022-03-21 (×3): qty 250

## 2022-03-21 MED ORDER — GLYCOPYRROLATE 0.2 MG/ML IJ SOLN
INTRAMUSCULAR | Status: DC | PRN
  Administered 2022-03-21: .2 mg via INTRAVENOUS

## 2022-03-21 MED ORDER — PROPOFOL 1000 MG/100ML IV EMUL
0.0000 ug/kg/min | INTRAVENOUS | Status: DC
  Administered 2022-03-21: 20 ug/kg/min via INTRAVENOUS
  Filled 2022-03-21: qty 100

## 2022-03-21 MED ORDER — METRONIDAZOLE 500 MG/100ML IV SOLN
500.0000 mg | Freq: Three times a day (TID) | INTRAVENOUS | Status: DC
  Administered 2022-03-21 – 2022-03-23 (×6): 500 mg via INTRAVENOUS
  Filled 2022-03-21 (×6): qty 100

## 2022-03-21 MED ORDER — FAMOTIDINE IN NACL 20-0.9 MG/50ML-% IV SOLN
20.0000 mg | Freq: Every day | INTRAVENOUS | Status: DC
  Administered 2022-03-21: 20 mg via INTRAVENOUS
  Filled 2022-03-21 (×2): qty 50

## 2022-03-21 MED ORDER — FENTANYL 2500MCG IN NS 250ML (10MCG/ML) PREMIX INFUSION
25.0000 ug/h | INTRAVENOUS | Status: DC
  Administered 2022-03-21: 25 ug/h via INTRAVENOUS
  Filled 2022-03-21: qty 250

## 2022-03-21 MED ORDER — PROPOFOL 10 MG/ML IV BOLUS
INTRAVENOUS | Status: DC | PRN
  Administered 2022-03-21: 70 mg via INTRAVENOUS

## 2022-03-21 MED ORDER — ALBUMIN HUMAN 5 % IV SOLN: INTRAVENOUS | Status: DC | PRN

## 2022-03-21 MED ORDER — SUCCINYLCHOLINE CHLORIDE 200 MG/10ML IV SOSY
PREFILLED_SYRINGE | INTRAVENOUS | Status: AC
  Filled 2022-03-21: qty 10

## 2022-03-21 MED ORDER — FENTANYL CITRATE (PF) 100 MCG/2ML IJ SOLN
INTRAMUSCULAR | Status: DC | PRN
  Administered 2022-03-21: 25 ug via INTRAVENOUS
  Administered 2022-03-21: 50 ug via INTRAVENOUS
  Administered 2022-03-21: 25 ug via INTRAVENOUS

## 2022-03-21 MED ORDER — PIPERACILLIN-TAZOBACTAM 3.375 G IVPB
3.3750 g | Freq: Three times a day (TID) | INTRAVENOUS | Status: AC
  Administered 2022-03-21 – 2022-03-24 (×11): 3.375 g via INTRAVENOUS
  Filled 2022-03-21 (×13): qty 50

## 2022-03-21 MED ORDER — FENTANYL BOLUS VIA INFUSION
25.0000 ug | INTRAVENOUS | Status: DC | PRN
  Administered 2022-03-21: 50 ug via INTRAVENOUS

## 2022-03-21 MED ORDER — HEPARIN (PORCINE) 25000 UT/250ML-% IV SOLN
750.0000 [IU]/h | INTRAVENOUS | Status: DC
  Administered 2022-03-22: 550 [IU]/h via INTRAVENOUS
  Administered 2022-03-23: 750 [IU]/h via INTRAVENOUS
  Filled 2022-03-21 (×2): qty 250

## 2022-03-21 MED ORDER — LACTATED RINGERS IV SOLN: INTRAVENOUS | Status: DC | PRN

## 2022-03-21 MED ORDER — EPHEDRINE SULFATE (PRESSORS) 50 MG/ML IJ SOLN
INTRAMUSCULAR | Status: DC | PRN
  Administered 2022-03-21 (×2): 5 mg via INTRAVENOUS

## 2022-03-21 MED ORDER — ROCURONIUM BROMIDE 10 MG/ML (PF) SYRINGE
PREFILLED_SYRINGE | INTRAVENOUS | Status: AC
  Filled 2022-03-21: qty 10

## 2022-03-21 MED ORDER — TRACE MINERALS CU-MN-SE-ZN 300-55-60-3000 MCG/ML IV SOLN
INTRAVENOUS | Status: AC
  Filled 2022-03-21: qty 464

## 2022-03-21 MED ORDER — PROPOFOL 500 MG/50ML IV EMUL
INTRAVENOUS | Status: DC | PRN
  Administered 2022-03-21: 25 ug/kg/min via INTRAVENOUS

## 2022-03-21 MED ORDER — LIDOCAINE HCL (CARDIAC) PF 50 MG/5ML IV SOSY
PREFILLED_SYRINGE | INTRAVENOUS | Status: DC | PRN
  Administered 2022-03-21: 60 mg via INTRAVENOUS

## 2022-03-21 SURGICAL SUPPLY — 47 items
APL PRP STRL LF DISP 70% ISPRP (MISCELLANEOUS) ×1
BLADE CLIPPER SURG (BLADE) IMPLANT
CANISTER SUCT 3000ML PPV (MISCELLANEOUS) ×2 IMPLANT
CHLORAPREP W/TINT 26 (MISCELLANEOUS) ×2 IMPLANT
COVER SURGICAL LIGHT HANDLE (MISCELLANEOUS) ×2 IMPLANT
DRAPE LAPAROSCOPIC ABDOMINAL (DRAPES) ×2 IMPLANT
DRAPE WARM FLUID 44X44 (DRAPES) ×2 IMPLANT
DRSG OPSITE 6X11 MED (GAUZE/BANDAGES/DRESSINGS) IMPLANT
DRSG OPSITE POSTOP 4X10 (GAUZE/BANDAGES/DRESSINGS) IMPLANT
DRSG OPSITE POSTOP 4X8 (GAUZE/BANDAGES/DRESSINGS) IMPLANT
ELECT BLADE 6.5 EXT (BLADE) IMPLANT
ELECT CAUTERY BLADE 6.4 (BLADE) ×2 IMPLANT
ELECT REM PT RETURN 9FT ADLT (ELECTROSURGICAL) ×1
ELECTRODE REM PT RTRN 9FT ADLT (ELECTROSURGICAL) ×2 IMPLANT
GLOVE BIO SURGEON STRL SZ7.5 (GLOVE) ×2 IMPLANT
GLOVE BIOGEL PI IND STRL 8 (GLOVE) ×2 IMPLANT
GOWN STRL REUS W/ TWL LRG LVL3 (GOWN DISPOSABLE) ×2 IMPLANT
GOWN STRL REUS W/ TWL XL LVL3 (GOWN DISPOSABLE) ×2 IMPLANT
GOWN STRL REUS W/TWL LRG LVL3 (GOWN DISPOSABLE) ×1
GOWN STRL REUS W/TWL XL LVL3 (GOWN DISPOSABLE) ×1
HANDLE SUCTION POOLE (INSTRUMENTS) ×2 IMPLANT
KIT BASIN OR (CUSTOM PROCEDURE TRAY) ×2 IMPLANT
KIT OSTOMY DRAINABLE 2.75 STR (WOUND CARE) IMPLANT
KIT TURNOVER KIT B (KITS) ×2 IMPLANT
LIGASURE IMPACT 36 18CM CVD LR (INSTRUMENTS) IMPLANT
NS IRRIG 1000ML POUR BTL (IV SOLUTION) ×4 IMPLANT
PACK GENERAL/GYN (CUSTOM PROCEDURE TRAY) ×2 IMPLANT
PAD ARMBOARD 7.5X6 YLW CONV (MISCELLANEOUS) ×2 IMPLANT
PENCIL SMOKE EVACUATOR (MISCELLANEOUS) ×2 IMPLANT
RELOAD PROXIMATE 75MM BLUE (ENDOMECHANICALS) ×1 IMPLANT
RELOAD STAPLE 75 3.8 BLU REG (ENDOMECHANICALS) IMPLANT
SPECIMEN JAR LARGE (MISCELLANEOUS) IMPLANT
SPONGE T-LAP 18X18 ~~LOC~~+RFID (SPONGE) IMPLANT
STAPLER PROXIMATE 75MM BLUE (STAPLE) IMPLANT
STAPLER VISISTAT 35W (STAPLE) ×2 IMPLANT
SUCTION POOLE HANDLE (INSTRUMENTS) ×1
SUT PDS AB 1 TP1 54 (SUTURE) ×4 IMPLANT
SUT PDS AB 2-0 CT1 27 (SUTURE) IMPLANT
SUT SILK 2 0 SH CR/8 (SUTURE) ×2 IMPLANT
SUT SILK 2 0 TIES 10X30 (SUTURE) ×2 IMPLANT
SUT SILK 3 0 SH CR/8 (SUTURE) ×2 IMPLANT
SUT SILK 3 0 TIES 10X30 (SUTURE) ×2 IMPLANT
SUT VIC AB 2-0 SH 18 (SUTURE) IMPLANT
SUT VIC AB 3-0 SH 8-18 (SUTURE) IMPLANT
TOWEL GREEN STERILE (TOWEL DISPOSABLE) ×2 IMPLANT
TRAY FOLEY MTR SLVR 16FR STAT (SET/KITS/TRAYS/PACK) ×2 IMPLANT
YANKAUER SUCT BULB TIP NO VENT (SUCTIONS) IMPLANT

## 2022-03-21 NOTE — Anesthesia Postprocedure Evaluation (Signed)
Anesthesia Post Note  Patient: Julie Montgomery  Procedure(s) Performed: EXPLORATORY LAPAROTOMY,subtotal colectomy with end ileostomy     Patient location during evaluation: PACU Anesthesia Type: General Level of consciousness: patient remains intubated per anesthesia plan Pain management: pain level controlled Vital Signs Assessment: post-procedure vital signs reviewed and stable Respiratory status: patient remains intubated per anesthesia plan Cardiovascular status: stable Postop Assessment: no apparent nausea or vomiting Anesthetic complications: no   No notable events documented.  Last Vitals:  Vitals:   03/21/22 0539 03/21/22 0625  BP:    Pulse: 68 (!) 53  Resp: 19 15  Temp:    SpO2: 100% 100%    Last Pain:  Vitals:   03/21/22 0113  TempSrc: Oral  PainSc:                  Kitzia Camus

## 2022-03-21 NOTE — Anesthesia Preprocedure Evaluation (Signed)
Anesthesia Evaluation  Patient identified by MRN, date of birth, ID band  Reviewed: Allergy & Precautions, NPO status , [name] Chart, lab work & pertinent test results  Airway Mallampati: I       Dental   Pulmonary    breath sounds clear to auscultation       Cardiovascular + CAD   Rhythm:Regular Rate:Normal     Neuro/Psych    GI/Hepatic Neg liver ROS,,,History noted Dr. Nyoka Cowden   Endo/Other  negative endocrine ROS    Renal/GU negative Renal ROS     Musculoskeletal   Abdominal   Peds  Hematology   Anesthesia Other Findings   Reproductive/Obstetrics                             Anesthesia Physical Anesthesia Plan  ASA: 3  Anesthesia Plan: General   Post-op Pain Management:    Induction:   PONV Risk Score and Plan: Treatment may vary due to age or medical condition and Ondansetron  Airway Management Planned: Oral ETT  Additional Equipment:   Intra-op Plan:   Post-operative Plan: Possible Post-op intubation/ventilation  Informed Consent: I have reviewed the patients History and Physical, chart, labs and discussed the procedure including the risks, benefits and alternatives for the proposed anesthesia with the patient or authorized representative who has indicated his/her understanding and acceptance.     Dental advisory given  Plan Discussed with: CRNA, Anesthesiologist and Surgeon  Anesthesia Plan Comments:        Anesthesia Quick Evaluation

## 2022-03-21 NOTE — Progress Notes (Signed)
OT Cancellation Note  Patient Details Name: GABRIELLY MCCRYSTAL MRN: [id number] DOB: 1936/04/16   Cancelled Treatment:    Reason Eval/Treat Not Completed: Medical issues which prohibited therapy (Pt intubated and sedated following ex lap, sub total colectomy and end ileostomy on 11/21. Will continue to follow.)  Malka So 03/21/2022, 11:51 AM Cleta Alberts, OTR/L Acute Rehabilitation Services Office: [phone]

## 2022-03-21 NOTE — Progress Notes (Signed)
[facility] Progress Note Patient Name: [name] DOB: 1935/07/30 MRN: [id number]   Date of Service  03/21/2022  HPI/Events of Note  AFIB with RVR - Ventricular rate = 160-170. Currently on Amiodarone PO. BP = 106/70 on a Phenylephrine IV infusion.   eICU Interventions  Plan: D/C Amiodarone PO. Amiodarone IV load and infusion. D/C Heparin [place]. Heparin IV infusion per pharmacy consult. BMP and Mg++ level STAT.     Intervention Category Major Interventions: Arrhythmia - evaluation and management  Gurney Balthazor Cornelia Copa 03/21/2022, 8:29 PM

## 2022-03-21 NOTE — Progress Notes (Signed)
Pharmacy Antibiotic Note  Julie Montgomery is a 86 y.o. female admitted on 03/05/2022 with  ?perforated cecum .  Pharmacy has been consulted for Zosyn dosing. WBC elevated. Free air present on imaging. Renal function age appropriate for low body mass.   Plan: Zosyn 3.375G IV q8h to be infused over 4 hours Going to surgery tonight  Height: '5\' 2"'$  (157.5 cm) Weight: 48.5 kg (107 lb) IBW/kg (Calculated) : 50.1  Temp (24hrs), Avg:97.9 F (36.6 C), Min:97.5 F (36.4 C), Max:98.5 F (36.9 C)  Recent Labs  Lab 03/16/22 0550 03/17/22 0535 03/18/22 0250 03/19/22 0350 03/20/22 0508 03/20/22 1840  WBC 11.9* 11.6* 16.7* 14.6* 15.5*  --   CREATININE 0.33* <0.30* 0.39* 0.47 0.30* 0.42*    Estimated Creatinine Clearance: 40.1 mL/min (A) (by C-G formula based on SCr of 0.42 mg/dL (L)).    Allergies  Allergen Reactions   Lactose Intolerance (Gi) Other (See Comments)    Intolerance    Lovastatin Other (See Comments)    Unknown reaction   Mometasone Furo-Formoterol Fum Other (See Comments)    Loss of appetite, laryngitis    Peanut-Containing Drug Products Other (See Comments)   Prozac [Fluoxetine Hcl] Other (See Comments)    Jumpy   Sulfa Antibiotics Other (See Comments)    Unknown reaction    Narda Bonds, PharmD, BCPS Clinical Pharmacist Phone: [phone]

## 2022-03-21 NOTE — Progress Notes (Signed)
Examined off sedation this evening. Awake, following commands and nodding. Tolerating SBT. Planning to extubate to [place].  Julian Hy, DO 03/21/22 5:05 PM  Pulmonary & Critical Care

## 2022-03-21 NOTE — Anesthesia Procedure Notes (Signed)
Procedure Name: Intubation Date/Time: 03/21/2022 3:07 AM  Performed by: Suzy Bouchard, CRNAPre-anesthesia Checklist: Patient identified, Emergency Drugs available, Suction available, Patient being monitored and Timeout performed Patient Re-evaluated:Patient Re-evaluated prior to induction Oxygen Delivery Method: Circle system utilized Preoxygenation: Pre-oxygenation with 100% oxygen Induction Type: IV induction and Rapid sequence Laryngoscope Size: Miller and 2 Grade View: Grade I Tube type: Oral Tube size: 7.0 mm Number of attempts: 1 Airway Equipment and Method: Stylet Placement Confirmation: ETT inserted through vocal cords under direct vision, positive ETCO2 and breath sounds checked- equal and bilateral Secured at: 21 cm Tube secured with: Tape Dental Injury: Teeth and Oropharynx as per pre-operative assessment

## 2022-03-21 NOTE — Transfer of Care (Signed)
Immediate Anesthesia Transfer of Care Note  Patient: Julie Montgomery  Procedure(s) Performed: EXPLORATORY LAPAROTOMY,subtotal colectomy with end ileostomy  Patient Location: ICU  Anesthesia Type:General  Level of Consciousness: sedated and Patient remains intubated per anesthesia plan  Airway & Oxygen Therapy: Patient remains intubated per anesthesia plan and Patient placed on Ventilator (see vital sign flow sheet for setting)  Post-op Assessment: Report given to RN and Post -op Vital signs reviewed and stable  Post vital signs: Reviewed and stable  Last Vitals:  Vitals Value Taken Time  BP    Temp    Pulse    Resp    SpO2      Last Pain:  Vitals:   03/21/22 0113  TempSrc: Oral  PainSc:       Patients Stated Pain Goal: 0 (02/28/27 1188)  Complications: No notable events documented.

## 2022-03-21 NOTE — Progress Notes (Signed)
Pt with order to have surgery. Family @ bedside(Son & husband) & is aware of the situation. Surgeon spoke to the son & husband. Consent done @ OR. V/S stable. Report given to CRNA Estill Bamberg. Transported pt to OR without difficulty.

## 2022-03-21 NOTE — Progress Notes (Addendum)
PHARMACY - TOTAL PARENTERAL NUTRITION CONSULT NOTE  Indication:  prolonged hospitalization with severe malnutrition/anasarca  Patient Measurements: Height: '5\' 2"'$  (157.5 cm) Weight: 42.3 kg (93 lb 4.1 oz) IBW/kg (Calculated) : 50.1 TPN AdjBW (KG): 41.3 Body mass index is 17.06 kg/m. Usual Weight: 49.5 kg  Assessment:  [name] with malnutrition d/t multiple factors including dementia, recurrent C diff with multiple admissions and GI intolerance, and difficulty chewing with ill-fitting dentures. Pt had poor PO intake prior to admit and reports nut allergy and lactose intolerance. Pharmacy consulted for TPN in setting of malnutrition and inability to meet needs with PO intake.    Patient has reported nut allergy (peanuts) - low risk of cross reactivity with SMOFlipid ingredient. Upon asking for rxn/details, pt and pt spouse do not remember pt having a nut allergy.  Have been tolerating SMOFlipid.  Glucose / Insulin: A1c 5.4% - CBG 123-258 Used 20 units SSI in past 24 hrs S/p methylpred '20mg'$  11/17-11/21 for colitis Electrolytes: all WNL Renal: SCr < 1, BUN 48 Hepatic: ALT elevated to 62, AST / tbili / TG WNL, albumin 3.3 Intake / Output; MIVF: UOP 1.4 mL/kg/hr, LBM 11/20, Lasix 40 IV daily  GI Imaging: 11/6 CTAP: persistent colonic distention , cannot exclude toxic megacolon 11/9 Ab Xray: decrease in gas distention, improved ileus  11/10 Ab Xray: improvement in gaseous distention 11/12 Ab Xray: no bowel dilation to suggest obstruction/ileus 11/16 Flex sigmoidoscopy: colon bx, mod diverticulosis 1/17 Abd XR: no bowel obstruction GI Surgeries / Procedures:  11/22: Subtotal colectomy with end ileostomy  Central access: PICC placed 03/10/22 TPN start date: 03/10/2022  Nutritional Goals: RD Estimated Needs Total Energy Estimated Needs: 1400-1600 Total Protein Estimated Needs: 70-85g Total Fluid Estimated Needs: 1.4-1.6L  Current Nutrition:  TPN & NPO Tolerated dysphagia 3 diet  from 11/16-11/21  Plan:  Increase concentrated TPN to full goal rate 50 mL/hr to provide 100% of needs (1451 kCal and 70g AA) - Receiving propofol 30 mcg/kg/min: plan is to extubate next 24 hours so will not adjust TPN today Electrolytes in TPN: Na 1439mq/L, K 336m/L, Ca 39m65mL, Mg 139m65m, Phos 30mm57m, max CL Add standard MVI and trace elements to TPN Continue moderate SSI Q4H Monitor TPN labs on Mon/Thurs and daily until stable  LorinErskine SpeedrmD Clinical Pharmacist 03/21/2022, 10:22 AM

## 2022-03-21 NOTE — Progress Notes (Signed)
Day of Surgery  Subjective: CC: Sedated on vent.  On neo, currently 55mg/min NGT in place, scant output.  Foley in place, scant ouput in bag. But good uop on I/O.  Sweat in ostomy bag, no stool. No family at bedside.   Objective: Vital signs in last 24 hours: Temp:  [97.5 F (36.4 C)-98.5 F (36.9 C)] 97.5 F (36.4 C) (11/22 1117) Pulse Rate:  [53-91] 53 (11/22 0625) Resp:  [15-24] 15 (11/22 0625) BP: (126-129)/(61-70) 129/61 (11/22 0113) SpO2:  [94 %-100 %] 100 % (11/22 0625) FiO2 (%):  [40 %] 40 % (11/22 0927) Weight:  [42.3 kg] 42.3 kg (11/22 0500) Last BM Date : 03/20/22  Intake/Output from previous day: 11/21 0701 - 11/22 0700 In: 921.1 [I.V.:669.5; IV Piggyback:251.7] Out: 1425 [Urine:1375; Blood:50] Intake/Output this shift: No intake/output data recorded.  PE: Gen:  sedated on vent.  Abd: Soft, mild distension, no rigidity or guarding, midline wound with honeycomb dressing in place - clean, hypoactive bs. Stoma budded and viable. Sweat in ostomy bag, no stool.   Lab Results:  Recent Labs    03/20/22 0508 03/21/22 0635 03/21/22 0737  WBC 15.5*  --  41.5*  HGB 9.6* 9.9* 9.8*  HCT 29.5* 29.0* 30.2*  PLT 465*  --  519*   BMET Recent Labs    03/20/22 1840 03/21/22 0635 03/21/22 0737  NA 139 139 138  K 4.5 4.5 4.7  CL 104  --  104  CO2 28  --  26  GLUCOSE 110*  --  177*  BUN 50*  --  48*  CREATININE 0.42*  --  0.57  CALCIUM 9.4  --  9.4   PT/INR Recent Labs    03/21/22 0140  LABPROT 14.5  INR 1.1   CMP     Component Value Date/Time   NA 138 03/21/2022 0737   K 4.7 03/21/2022 0737   CL 104 03/21/2022 0737   CO2 26 03/21/2022 0737   GLUCOSE 177 (H) 03/21/2022 0737   GLUCOSE 99 05/14/2006 0941   BUN 48 (H) 03/21/2022 0737   CREATININE 0.57 03/21/2022 0737   CALCIUM 9.4 03/21/2022 0737   PROT 5.7 (L) 03/21/2022 0737   ALBUMIN 3.3 (L) 03/21/2022 0737   AST 37 03/21/2022 0737   ALT 62 (H) 03/21/2022 0737   ALKPHOS 77 03/21/2022  0737   BILITOT 0.5 03/21/2022 0737   GFRNONAA >60 03/21/2022 0737   GFRAA >60 07/02/2019 1151   Lipase     Component Value Date/Time   LIPASE 45 03/05/2022 1607    Studies/Results: DG Abd Portable 1V  Addendum Date: 03/21/2022   ADDENDUM REPORT: 03/21/2022 00:31 ADDENDUM: These results were called by telephone at the time of interpretation on 03/20/2022 at 11:35 pm to provider TIMOTHY OPYD , who verbally acknowledged these results. Electronically Signed   By: ARonney AstersM.D.   On: 03/21/2022 00:31   Result Date: 03/21/2022 CLINICAL DATA:  Abdominal pain EXAM: PORTABLE ABDOMEN - 1 VIEW COMPARISON:  Abdominal x-ray 03/16/2022. CT abdomen and pelvis 03/05/2022 FINDINGS: There is a new large amount of free intraperitoneal air. There are dilated loops of bowel in the central abdomen measuring up to 10 cm. Minimal colonic gas and rectal gas present. No suspicious calcifications. Lung bases are clear. No acute fractures are identified. IMPRESSION: 1. New large amount of free intraperitoneal air. 2. Dilated loops of bowel in the central abdomen measuring up to 10 cm, likely colon. Electronically Signed: By: ARonney AstersM.D. On:  03/20/2022 23:15   DG CHEST PORT 1 VIEW  Result Date: 03/20/2022 CLINICAL DATA:  Shortness of breath EXAM: PORTABLE CHEST 1 VIEW COMPARISON:  Previous studies including the examination of 03/19/2022 FINDINGS: Cardiac size is within normal limits. Tip of right PICC line is seen in superior vena cava. There is interval decrease in interstitial markings in parahilar regions and lower lung fields suggesting further clearing of pulmonary edema. No new focal infiltrates are seen. Small bilateral pleural effusions are seen. There is no pneumothorax. Degenerative changes are noted in right shoulder. IMPRESSION: There is decrease in interstitial markings in both lungs suggesting clearing of pulmonary edema. No new focal infiltrates are seen. Small bilateral pleural effusions are  seen. Electronically Signed   By: Elmer Picker M.D.   On: 03/20/2022 12:37   DG CHEST PORT 1 VIEW  Result Date: 03/19/2022 CLINICAL DATA:  Shortness of breath EXAM: PORTABLE CHEST 1 VIEW COMPARISON:  Radiograph 03/16/2022 FINDINGS: Right upper extremity PICC tip overlies the distal SVC. Unchanged size of the cardiomediastinal silhouette. There are mild interstitial opacities and central pulmonary vascular prominence, decreased from prior exam. Persistent small pleural effusions. No pneumothorax. No new airspace disease. Thoracic spondylosis. Bilateral shoulder degenerative changes. IMPRESSION: Improving interstitial pulmonary edema. Persistent small pleural effusions. Electronically Signed   By: Maurine Simmering M.D.   On: 03/19/2022 11:52    Anti-infectives: Anti-infectives (From admission, onward)    Start     Dose/Rate Route Frequency Ordered Stop   03/21/22 0100  piperacillin-tazobactam (ZOSYN) IVPB 3.375 g        3.375 g 12.5 mL/hr over 240 Minutes Intravenous Every 8 hours 03/21/22 0004     03/16/22 0800  vancomycin (VANCOCIN) capsule 125 mg  Status:  Discontinued       See Hyperspace for full Linked Orders Report.   125 mg Oral Daily with breakfast 03/06/22 0220 03/06/22 1240   03/09/22 2200  vancomycin (VANCOCIN) capsule 125 mg       Note to Pharmacy: 02/27/22 - 03/08/22: Take 1 capsule four times daily; 11/10- 11/16: take 1 capsule two times daily; 11/17 - 11/23: take 1 capsule once daily; from 11/24 and on, see other prescrption sent to [facility]     125 mg Oral 2 times daily 03/09/22 1635 03/14/22 2126   03/09/22 1730  azithromycin (ZITHROMAX) 1,000 mg in dextrose 5 % 250 mL IVPB        1,000 mg 260 mL/hr over 60 Minutes Intravenous Every 24 hours 03/09/22 1635 03/09/22 2024   03/09/22 0800  vancomycin (VANCOCIN) capsule 125 mg  Status:  Discontinued       See Hyperspace for full Linked Orders Report.   125 mg Oral 2 times daily with meals 03/06/22 0220 03/06/22 1240   03/06/22 1800   vancomycin (VANCOCIN) 500 mg in sodium chloride irrigation 0.9 % 100 mL ENEMA  Status:  Discontinued        500 mg Rectal Every 6 hours 03/06/22 1429 03/06/22 1528   03/06/22 1800  vancomycin (VANCOCIN) capsule 125 mg       Note to Pharmacy: 02/27/22 - 03/08/22: Take 1 capsule four times daily; 11/10- 11/16: take 1 capsule two times daily; 11/17 - 11/23: take 1 capsule once daily; from 11/24 and on, see other prescrption [facility]     125 mg Oral Every 6 hours 03/06/22 1528 03/09/22 1300   03/06/22 1300  metroNIDAZOLE (FLAGYL) IVPB 500 mg  Status:  Discontinued        500  mg 100 mL/hr over 60 Minutes Intravenous Every 8 hours 03/06/22 1249 03/08/22 0838   03/06/22 0800  vancomycin (VANCOCIN) capsule 125 mg  Status:  Discontinued       Note to Pharmacy: 02/27/22 - 03/08/22: Take 1 capsule four times daily; 11/10- 11/16: take 1 capsule two times daily; 11/17 - 11/23: take 1 capsule once daily; from 11/24 and on, see other prescrption [facility]     125 mg Oral 3 times daily before meals & bedtime 03/06/22 0216 03/06/22 1240        Assessment/Plan POD 0 s/p Subtotal colectomy with end ileostomy for Pneumoperitoneum by Dr. Thermon Leyland on 03/20/22 Findings: Massively dilated proximal colon, pneumoperitoneum, no large clear enterotomy identified. Resected from TI to distal descending colon. Has long rectosigmoid stump  - Cont NGT, AROBF. Already on TPN - Cont abx - WOCN for new ostomy - Surgical path pending  - Cont abx - Vent and pressors per CCM  FEN - NPO, NGT, IVF per CCM VTE - SCDs, will discuss w/ MD but suspect will be okay for heparin gtt with no bolus after 1400 today from our standpoint ID - Flagyl 11/7 - 11/9. Vanc 11/7 - 11/16. Zosyn 11/22 >> WBC 41.5 Foley - In place, monitor I/O.   Dementia PAF on Eliquis at baseline Recurrent C. Diff - C. Diff was negative 11/10. Tx w/ Vanc as above per ID  GI panel + for E. Coli HFpEF    LOS: 15 days    Julie Montgomery ,  [facility] 03/21/2022, 11:19 AM Please see Amion for pager number during day hours 7:00am-4:30pm

## 2022-03-21 NOTE — Progress Notes (Addendum)
Progress Note: General Surgery Service   Chief Complaint/Subjective: Severe abdominal pain developed tonight.  AXR with free air.    Objective: Vital signs in last 24 hours: Temp:  [97.5 F (36.4 C)-98.5 F (36.9 C)] 97.5 F (36.4 C) (11/21 2102) Pulse Rate:  [63-91] 63 (11/21 2102) Resp:  [19-24] 20 (11/21 2102) BP: (126-151)/(65-137) 126/70 (11/21 2102) SpO2:  [94 %-100 %] 100 % (11/21 2102) Last BM Date : 03/20/22  Intake/Output from previous day: 11/21 0701 - 11/22 0700 In: -  Out: 1060 [Urine:1060] Intake/Output this shift: Total I/O In: -  Out: 560 [Urine:560]  Constitutional: NAD; conversant; no deformities Eyes: Moist conjunctiva; no lid lag; anicteric; PERRL Neck: Trachea midline; no thyromegaly Lungs: Normal respiratory effort; no tactile fremitus CV: RRR; no palpable thrills; no pitting edema GI: Abd Tender, severely dilated MSK: Normal range of motion of extremities; no clubbing/cyanosis Psychiatric: Appropriate affect; alert and oriented x3 Lymphatic: No palpable cervical or axillary lymphadenopathy  Lab Results: CBC  Recent Labs    03/19/22 0350 03/20/22 0508  WBC 14.6* 15.5*  HGB 9.2* 9.6*  HCT 28.1* 29.5*  PLT 397 465*   BMET Recent Labs    03/20/22 0508 03/20/22 1840  NA 138 139  K 4.3 4.5  CL 100 104  CO2 28 28  GLUCOSE 176* 110*  BUN 36* 50*  CREATININE 0.30* 0.42*  CALCIUM 9.5 9.4   PT/INR No results for input(s): "LABPROT", "INR" in the last 72 hours. ABG No results for input(s): "PHART", "HCO3" in the last 72 hours.  Invalid input(s): "PCO2", "PO2"  Anti-infectives: Anti-infectives (From admission, onward)    Start     Dose/Rate Route Frequency Ordered Stop   03/21/22 0100  piperacillin-tazobactam (ZOSYN) IVPB 3.375 g        3.375 g 12.5 mL/hr over 240 Minutes Intravenous Every 8 hours 03/21/22 0004     03/16/22 0800  vancomycin (VANCOCIN) capsule 125 mg  Status:  Discontinued       See Hyperspace for full Linked Orders  Report.   125 mg Oral Daily with breakfast 03/06/22 0220 03/06/22 1240   03/09/22 2200  vancomycin (VANCOCIN) capsule 125 mg       Note to Pharmacy: 02/27/22 - 03/08/22: Take 1 capsule four times daily; 11/10- 11/16: take 1 capsule two times daily; 11/17 - 11/23: take 1 capsule once daily; from 11/24 and on, see other [facility]ption [facility]     125 mg Oral 2 times daily 03/09/22 1635 03/14/22 2126   03/09/22 1730  azithromycin (ZITHROMAX) 1,000 mg in dextrose 5 % 250 mL IVPB        1,000 mg 260 mL/hr over 60 Minutes Intravenous Every 24 hours 03/09/22 1635 03/09/22 2024   03/09/22 0800  vancomycin (VANCOCIN) capsule 125 mg  Status:  Discontinued       See Hyperspace for full Linked Orders Report.   125 mg Oral 2 times daily with meals 03/06/22 0220 03/06/22 1240   03/06/22 1800  vancomycin (VANCOCIN) 500 mg in sodium chloride irrigation 0.9 % 100 mL ENEMA  Status:  Discontinued        500 mg Rectal Every 6 hours 03/06/22 1429 03/06/22 1528   03/06/22 1800  vancomycin (VANCOCIN) capsule 125 mg       Note to Pharmacy: 02/27/22 - 03/08/22: Take 1 capsule four times daily; 11/10- 11/16: take 1 capsule two times daily; 11/17 - 11/23: take 1 capsule once daily; from 11/24 and on, see other [facility]     125 mg Oral Every 6 hours 03/06/22 1528 03/09/22 1300   03/06/22 1300  metroNIDAZOLE (FLAGYL) IVPB 500 mg  Status:  Discontinued        500 mg 100 mL/hr over 60 Minutes Intravenous Every 8 hours 03/06/22 1249 03/08/22 0838   03/06/22 0800  vancomycin (VANCOCIN) capsule 125 mg  Status:  Discontinued       Note to Pharmacy: 02/27/22 - 03/08/22: Take 1 capsule four times daily; 11/10- 11/16: take 1 capsule two times daily; 11/17 - 11/23: take 1 capsule once daily; from 11/24 and on, see other prescrption [facility]     125 mg Oral 3 times daily before meals & bedtime 03/06/22 0216 03/06/22 1240       Medications: Scheduled Meds:  (feeding supplement) PROSource Plus  30 mL Oral TID BM    amiodarone  200 mg Oral Daily   apixaban  2.5 mg Oral BID   budesonide  6 mg Oral Daily   charcoal activated (NO SORBITOL)  50 g Oral STAT   Chlorhexidine Gluconate Cloth  6 each Topical Daily   diltiazem  240 mg Oral Daily   feeding supplement  1 Container Oral TID BM   feeding supplement  237 mL Oral Q24H   fiber supplement (BANATROL TF)  60 mL Oral BID   furosemide  40 mg Intravenous Daily   Gerhardt's butt cream   Topical BID   insulin aspart  0-15 Units Subcutaneous Q4H   leptospermum manuka honey  1 Application Topical Daily   lidocaine  1 patch Transdermal Q24H   memantine  5 mg Oral BID   sodium chloride flush  10-40 mL Intracatheter Q12H   Vitamin D (Ergocalciferol)  50,000 Units Oral Q7 days   Continuous Infusions:  piperacillin-tazobactam (ZOSYN)  IV     prothrombin complex conc human (KCENTRA) IVPB 2,425 Units     TPN ADULT (ION) 25 mL/hr at 03/20/22 1802   PRN Meds:.acetaminophen **OR** acetaminophen, alum & mag hydroxide-simeth, fentaNYL (SUBLIMAZE) injection, naLOXone (NARCAN)  injection, ondansetron (ZOFRAN) IV, sodium chloride flush  Assessment/Plan: Julie Montgomery is a 86 year old female with pneumoperitoneum after prolonged hospital stay for c. Diff colitis.    Patient seen and examined.  I'm concerned she has perforated her cecum.  She is high risk for surgery.  I recommended a exploratory laparotomy with likely total abdominal colectomy and end ileostomy.  We discussed the alternative of focusing on comfort care measures.  We discussed the procedure, its risks, benefits and alternatives.  I discussed this with the patient's son over the phone as well who is going to talk with his mom and dad and likely ask to proceed with surgery.  They would like to come in to see the patient prior to surgery.  I will order Julie Montgomery for eliquis reversal (Given earlier this evening).  We will likely proceed emergently to the operating room tonight.   LOS: 15 days    Felicie Morn, [facility] Use [email or web address] to contact on call provider  Daily Billing: [phone] - High MDM  ACS RISK CALCULATOR USE:  Risk Calculator was used for discussion of surgery: Yes

## 2022-03-21 NOTE — Progress Notes (Signed)
  Overnight events noted with rapidly progressive megacolon and perforation requiring subtotal colectomy with end ileostomy. Currently intubated in the ICU, clinically stable.  I discussed the case with Dr. Carlis Abbott of critical care, and she has asked infectious disease to be involved again over the question of whether this patient still has active C. difficile requiring treatment because she is quite ill and there is still a segment of colon remaining.  She is also on Zosyn postoperatively.  I spoke with her son Julie Montgomery at the bedside, seems to be handling things well at this point.  He and his family are greatly appreciative of the help they have received from his care team.  Very unfortunate turn of events from this chronic infection and subsequent malnutrition.  She will have a long road total for recovery, but seems to be doing reasonably well so far postoperatively.  Since the infectious disease service will be involved again and managing C. difficile and any other infectious issues, our service will sign off and remain available for consultation if needed.   Wilfrid Lund, MD    [facility]

## 2022-03-21 NOTE — Progress Notes (Signed)
[place] [facility] Progress Note Patient Name: Julie Montgomery DOB: 05-07-1935 MRN: [id number]   Date of Service  03/21/2022  HPI/Events of Note  Nursing concern for anxiety d/t HR fluctuating from 70's to 150's. Nurse feels that it is anxiety driven. However, the patient has a history of AFIB on Cardizem and Eliquis at home. Last K+ = 4.7 and Mg++ = 2.1. Presently, NSR with rate = 82. Sat = 97% and RR = 14 on [place] O2. Video assessment: looks comfortable on [place] O2.  eICU Interventions  Plan: Patient does not need anti anxiety therapy at this time.      Intervention Category Major Interventions: Other:  Lysle Dingwall 03/21/2022, 7:32 PM

## 2022-03-21 NOTE — Progress Notes (Signed)
Patient became agitated and tachypneic and had to be sedated. Placed pt back on full vent support on previously documented settings.

## 2022-03-21 NOTE — H&P (Signed)
NAME:  Julie Montgomery, MRN:  [id number], DOB:  1935-12-30, LOS: 66 ADMISSION DATE:  03/05/2022, CONSULTATION DATE:  03/21/22 REFERRING MD:  , CHIEF COMPLAINT:  vent dependence post op   History of Present Illness:  Julie Montgomery is a 86 y.o. F with PMH signficant for dementia, recurrent C. Diff, Atrial fibrillation on Eliquis, chronic hyponatremia who was initially admitted 11/6 after several days of poor po intake.  She has been hospitalized four times in 2023 and was discharged home 11/1 with oral Vancomycin for C. Diff.  Sh was admitted for hydration and further work-up , CT abdomen showed abdominal distension without perforation.  On 11/21 she experienced severely worsening upper abdominal pain and KUB showed large amount of intra-peritoneal air.   Surgery was consulted and pt underwent subtotal colectomy with end ileostomy on 11/22.  She was left intubated post-op and PCCM consulted for vent management.  Pertinent  Medical History  C diff, recurrent Severe malnutrition pAF on eliquis Stroke Colon polyp H pylori Dementia   Significant Hospital Events: Including procedures, antibiotic start and stop dates in addition to other pertinent events   11/6 admitted for FTT 11/9 started TPN 11/16 flex sig> congested mucosa, diverticula. Path not consistent with microscopic colitis or IBD.   11/22 perforated> colon resection with diverting ileostomy  Interim History / Subjective:  In ICU post-op intubated, requiring sedation due to agitation.   Objective   Blood pressure 129/61, pulse (!) 53, temperature 97.6 F (36.4 C), temperature source Oral, resp. rate 15, height '5\' 2"'$  (1.575 m), weight 42.3 kg, SpO2 100 %.    Vent Mode: PRVC FiO2 (%):  [40 %] 40 % Set Rate:  [16 bmp-20 bmp] 20 bmp Vt Set:  [400 mL] 400 mL PEEP:  [5 cmH20] 5 cmH20 Plateau Pressure:  [19 cmH20] 19 cmH20   Intake/Output Summary (Last 24 hours) at 03/21/2022 0648 Last data filed at 03/21/2022 0600 Gross per 24  hour  Intake 921.14 ml  Output 1425 ml  Net -503.86 ml   Filed Weights   03/18/22 0322 03/19/22 0416 03/21/22 0500  Weight: 49.4 kg 48.5 kg 42.3 kg    Examination: General: chronically ill appearing woman intubated, sedated HENT: temporal wasting, ETT Lungs: CTAB, synchronous with MV Cardiovascular: S1S2, RRR Abdomen: mildly TTP. Ostomy pink, midline incision dressed Extremities: no peripheral edema, minimal muscle mass Neuro: RASS -5, pinpoint pupils GU: foley  Labs reviewed. Path from OR pending.   Resolved Hospital Problem list     Assessment & Plan:  Acute bowel perforation, suspect related to severely dilated colon, s/p colectomy and diverting ileostomy.  -appreciate surgery's management -TPN to meet full nutritional needs, NPO until progressed by surgery -pain control as needed -hold eliquis for now -zosyn for contaminated abdomen -will clarify if she would need additional C diff treatment until her path comes back, but no pseudomembranes on flex sig last week  Shock likely 2/2 sedation, possibly due to sepsis from peritonitis -neosynephrine to maintain MAP >65; has PICC   Paroxysmal Afib -hold eliquis -con't amio  Post-op vent management -labs without severe acidosis; ok to work towards extubation today -LTVV -VAP prevention protocol -PAD protocol -SAT & SBT  Hypergylcemia -SSI PRN -goal BG 140-180  Severe protein energy malnutrition -TPN, bowel rest  Best Practice (right click and "Reselect all SmartList Selections" daily)   Diet/type: TPN DVT prophylaxis: prophylactic heparin  GI prophylaxis: H2B Lines: Central line Foley:  Yes, and it is still needed Code Status:  full code  Last date of multidisciplinary goals of care discussion [ no family at bedside at present]  Labs   CBC: Recent Labs  Lab 03/16/22 0550 03/17/22 0535 03/18/22 0250 03/19/22 0350 03/20/22 0508 03/21/22 0635  WBC 11.9* 11.6* 16.7* 14.6* 15.5*  --   NEUTROABS 9.9*  10.6* 14.3* 12.1* 12.8*  --   HGB 8.7* 9.3* 9.1* 9.2* 9.6* 9.9*  HCT 26.6* 27.8* 27.0* 28.1* 29.5* 29.0*  MCV 90.2 90.8 91.2 93.0 93.4  --   PLT 192 262 334 397 465*  --     Basic Metabolic Panel: Recent Labs  Lab 03/15/22 0450 03/16/22 0550 03/17/22 0535 03/18/22 0250 03/19/22 0350 03/20/22 0508 03/20/22 1840 03/21/22 0635  NA 127* 131* 134* 137 138 138 139 139  K 5.0 4.7 4.7 4.1 4.3 4.3 4.5 4.5  CL 94* 95* 97* 98 101 100 104  --   CO2 '27 28 29 29 30 28 28  '$ --   GLUCOSE 134* 142* 181* 134* 119* 176* 110*  --   BUN '15 16 20 '$ 30* 39* 36* 50*  --   CREATININE <0.30* 0.33* <0.30* 0.39* 0.47 0.30* 0.42*  --   CALCIUM 8.4* 9.0 9.5 9.3 9.1 9.5 9.4  --   MG 1.9 2.0 2.1  --  2.1  --   --   --   PHOS 3.0 3.2  --   --  4.0  --   --   --    GFR: Estimated Creatinine Clearance: 35 mL/min (A) (by C-G formula based on SCr of 0.42 mg/dL (L)). Recent Labs  Lab 03/17/22 0535 03/18/22 0250 03/19/22 0350 03/20/22 0508  WBC 11.6* 16.7* 14.6* 15.5*    Liver Function Tests: Recent Labs  Lab 03/15/22 0450 03/16/22 0550 03/19/22 0350  AST 21 21 38  ALT 19 21 50*  ALKPHOS 93 81 91  BILITOT <0.1* 0.2* 0.3  PROT 5.1* 5.5* 5.7*  ALBUMIN 2.1* 2.9* 2.8*   No results for input(s): "LIPASE", "AMYLASE" in the last 168 hours. No results for input(s): "AMMONIA" in the last 168 hours.  ABG    Component Value Date/Time   PHART 7.304 (L) 03/21/2022 0635   PCO2ART 58.2 (H) 03/21/2022 0635   PO2ART 134 (H) 03/21/2022 0635   HCO3 28.9 (H) 03/21/2022 0635   TCO2 31 03/21/2022 0635   O2SAT 99 03/21/2022 0635     Coagulation Profile: Recent Labs  Lab 03/21/22 0140  INR 1.1    Cardiac Enzymes: No results for input(s): "CKTOTAL", "CKMB", "CKMBINDEX", "TROPONINI" in the last 168 hours.  HbA1C: Hgb A1c MFr Bld  Date/Time Value Ref Range Status  03/12/2022 05:14 AM 5.4 4.8 - 5.6 % Final    Comment:    (NOTE) Pre diabetes:          5.7%-6.4%  Diabetes:              >6.4%  Glycemic  control for   <7.0% adults with diabetes     CBG: Recent Labs  Lab 03/20/22 1251 03/20/22 1604 03/20/22 2026 03/21/22 0110 03/21/22 0343  GLUCAP 258* 199* 123* 150* 130*    Review of Systems:   Unable to be obtained   Past Medical History:  She,  has a past medical history of Adenomatous polyp of colon (2020), Allergy, Anxiety, Basal cell carcinoma (BCC), Cataract, Depression, Helicobacter pylori gastritis, Osteopenia, Paroxysmal atrial fibrillation ([place]), Pneumonia, and Stroke ([place]).   Surgical History:   Past Surgical History:  Procedure Laterality Date   APPENDECTOMY  BIOPSY  03/15/2022   Procedure: BIOPSY;  Surgeon: Jerene Bears, MD;  Location: [facility] ENDOSCOPY;  Service: Gastroenterology;;   BREAST LUMPECTOMY     right   CATARACT EXTRACTION W/ INTRAOCULAR LENS  IMPLANT, BILATERAL Bilateral    CESAREAN SECTION     CHOLECYSTECTOMY     FLEXIBLE SIGMOIDOSCOPY N/A 03/15/2022   Procedure: FLEXIBLE SIGMOIDOSCOPY;  Surgeon: Jerene Bears, MD;  Location: [facility] ENDOSCOPY;  Service: Gastroenterology;  Laterality: N/A;   MOHS SURGERY     PARTIAL HYSTERECTOMY     ROTATOR CUFF REPAIR  2012   SKIN GRAFT     teeth remmoved       Social History:   reports that she has never smoked. She has never used smokeless tobacco. She reports that she does not drink alcohol and does not use drugs.   Family History:  Her family history includes Cancer in her brother and sister; Colon cancer in her son; Coronary artery disease in her brother; Dementia in her brother; Diabetes in her sister; Stomach cancer in her paternal grandmother. There is no history of Breast cancer, Esophageal cancer, or Rectal cancer.   Allergies Allergies  Allergen Reactions   Lactose Intolerance (Gi) Other (See Comments)    Intolerance    Lovastatin Other (See Comments)    Unknown reaction   Mometasone Furo-Formoterol Fum Other (See Comments)    Loss of appetite, laryngitis    Peanut-Containing Drug Products  Other (See Comments)   Prozac [Fluoxetine Hcl] Other (See Comments)    Jumpy   Sulfa Antibiotics Other (See Comments)    Unknown reaction     Home Medications  Prior to Admission medications   Medication Sig Start Date End Date Taking? Authorizing Provider  apixaban (ELIQUIS) 2.5 MG TABS tablet Take 1 tablet (2.5 mg total) by mouth 2 (two) times daily. 03/01/22  Yes Plotnikov, Evie Lacks, MD  diltiazem (CARDIZEM CD) 180 MG 24 hr capsule Take 1 capsule (180 mg total) by mouth daily. 03/01/22  Yes Sheikh, Omair Latif, DO  memantine (NAMENDA) 5 MG tablet Take 5 mg by mouth 2 (two) times daily. 02/20/22  Yes [provider]  Multiple Vitamins-Minerals (ZINC PO) Take 1 tablet by mouth daily.   Yes [provider]  Probiotic Product (ALIGN) 4 MG CAPS Take 1 capsule (4 mg total) by mouth daily. 12/26/21  Yes Plotnikov, Evie Lacks, MD  vancomycin (VANCOCIN) 125 MG capsule 02/27/22 - 03/08/22: Take 1 capsule four times daily; 11/10- 11/16: take 1 capsule two times daily; 11/17 - 11/23: take 1 capsule once daily; from 11/24 and on, see other prescrption sent to [facility] 02/27/22  Yes Nita Sells, MD  vancomycin (VANCOCIN) 125 MG capsule 03/23/22 - 04/19/22: take 1 capsule every other day; 04/20/22- 05/20/22: take 1 capsule every three days Patient not taking: Reported on 03/01/2022 03/23/22   Nita Sells, MD     Critical care time: 38 min.      Julian Hy, DO 03/21/22 1:28 PM [facility]

## 2022-03-21 NOTE — Progress Notes (Signed)
ANTICOAGULATION CONSULT NOTE - Initial Consult  Pharmacy Consult for IV heparin Indication: atrial fibrillation  Allergies  Allergen Reactions   Lactose Intolerance (Gi) Other (See Comments)    Intolerance    Lovastatin Other (See Comments)    Unknown reaction   Mometasone Furo-Formoterol Fum Other (See Comments)    Loss of appetite, laryngitis    Peanut-Containing Drug Products Other (See Comments)   Prozac [Fluoxetine Hcl] Other (See Comments)    Jumpy   Sulfa Antibiotics Other (See Comments)    Unknown reaction    Patient Measurements: Height: '5\' 2"'$  (157.5 cm) Weight: 42.3 kg (93 lb 4.1 oz) IBW/kg (Calculated) : 50.1 Heparin Dosing Weight: 42.3 kg  Vital Signs: Temp: 99.1 F (37.3 C) (11/22 2017) Temp Source: Axillary (11/22 2017) BP: 96/68 (11/22 2015) Pulse Rate: 140 (11/22 2029)  Labs: Recent Labs    03/19/22 0350 03/20/22 0508 03/20/22 1840 03/21/22 0140 03/21/22 0635 03/21/22 0737  HGB 9.2* 9.6*  --   --  9.9* 9.8*  HCT 28.1* 29.5*  --   --  29.0* 30.2*  PLT 397 465*  --   --   --  519*  APTT  --   --   --  24  --   --   LABPROT  --   --   --  14.5  --   --   INR  --   --   --  1.1  --   --   HEPARINUNFRC  --   --   --  >1.10*  --   --   CREATININE 0.47 0.30* 0.42*  --   --  0.57    Estimated Creatinine Clearance: 35 mL/min (by C-G formula based on SCr of 0.57 mg/dL).   Medical History: Past Medical History:  Diagnosis Date   Adenomatous polyp of colon 2020   Allergy    Anxiety    Basal cell carcinoma (BCC)    Cataract    Depression    Helicobacter pylori gastritis    Osteopenia    Paroxysmal atrial fibrillation (HCC)    Pneumonia    hx of 03/2008   Stroke [facility])    TIA long time ago    Medications:  Infusions:   amiodarone     Followed by   [START ON 03/22/2022] amiodarone     famotidine (PEPCID) IV Stopped (03/21/22 1451)   fentaNYL infusion INTRAVENOUS Stopped (03/21/22 1531)   metronidazole Stopped (03/21/22 1609)    phenylephrine (NEO-SYNEPHRINE) Adult infusion 60 mcg/min (03/21/22 1900)   piperacillin-tazobactam (ZOSYN)  IV 12.5 mL/hr at 03/21/22 1900   TPN ADULT (ION) 50 mL/hr at 03/21/22 1900    Assessment: 86 yo female on chronic Eliquis PTA for afib.  Eliquis on hold currently and pharmacy asked to start IV heparin as a bridge.  Last Eliquis dose last night (11/21) ~ 9 pm.  Expect heparin levels will be falsely elevated from Eliquis for several days, will need to dose based on aptts.  Had 5000 units of heparin this afternoon.  Goal of Therapy:  Heparin level 0.3-0.7 units/ml APTT 66-102 Monitor platelets by anticoagulation protocol: Yes   Plan:  Start IV heparin at 550 units/hr without bolus Check aPTT 8 hrs after gtt starts. Daily aptt, heparin level and CBC. F/u ability to resume Eliquis eventually.  [place] Crane, Roylene Reason, BCCP Clinical Pharmacist  03/21/2022 8:36 PM   [facility] pharmacy phone numbers are listed on [email or web address]

## 2022-03-21 NOTE — Op Note (Signed)
Patient: Julie Montgomery (December 29, 1935, [id number])  Date of Surgery: 03/21/22  Preoperative Diagnosis: Pneumoperitoneum   Postoperative Diagnosis: Pneumoperitoneum , dilated colon  Surgical Procedure: Subtotal colectomy with end ileostomy  Operative Team Members:  Surgeon(s) and Role:    * Gale Hulse, Nickola Major, MD - Primary   Anesthesiologist: Belinda Block, MD CRNA: Suzy Bouchard, CRNA   Anesthesia: General   Fluids:  Total I/O In: 250 [IV Piggyback:250] Out: 263 [FHLKT:625]  Complications: None  Drains:  none   Specimen:  ID Type Source Tests Collected by Time Destination  1 : colon Tissue PATH GI Other SURGICAL PATHOLOGY Erlean Mealor, Nickola Major, MD 03/21/2022 0411      Disposition:  ICU - intubated and hemodynamically stable.  Plan of Care:  Continue inpatient care    Indications for Procedure: Julie Montgomery is a 86 y.o. female who has been admitted to the medicine service for about 14 days with colon dilation on multiple images.  She has been treated for C. difficile colitis, however there was questions of whether the C. difficile was colonization or the real etiology of her presentation.  She has had intermittent nature to her dilation and a lot of diarrhea since she has been here.  Overnight, she developed severe abdominal pain and a abdominal film was ordered by the medical team.  This demonstrated pneumoperitoneum so surgery consult was placed.  I recommended exploratory laparotomy with likely subtotal colectomy with end ileostomy.  The procedure itself as well as its risks, benefits and alternatives were discussed.  The risks discussed included but were not limited to the risk of infection, bleeding, damage to nearby structures, and need for additional surgery or operation postoperatively.  After a full discussion and all questions answered the patient granted consent to proceed.  Findings: Massively dilated proximal colon, pneumoperitoneum, no large clear  enterotomy identified   Description of Procedure:   On the date stated above the patient was taken the operating room suite and placed in supine position.  General endotracheal anesthesia was induced.  A timeout was completed verifying the correct patient, procedure, position, and equipment needed for the case.  The patient's abdomen was prepped and draped in usual sterile fashion.  Antibiotics were given prior to the case start.  I made a midline laparotomy incision and entered the abdomen.  As I entered the abdomen, I did noticed pneumoperitoneum evacuated from the abdominal cavity.  I felt the colon may have broken free of an adhesion right as I entered the abdomen.  The colon was massively dilated.  I mobilized the colon.  I started with the right colon mobilized out of the retroperitoneum by dividing the white line of Toldt.  I worked superiorly to the hepatic flexure and divided the attachments between the colon and the liver.  There was evidence of a prior cholecystectomy.  The duodenum was identified and inspected in this area to ensure there was no other explanation for the patient's pneumoperitoneum.  The transverse colon was mobilized dividing its attachments to the gastrocolic ligament.  The splenic flexure of the colon was mobilized.  I worked both from the transverse colon and up from the descending colon to mobilize the splenic flexure.  I divided the white line of Toldt lifting the descending colon out of the retroperitoneum.  Working both directions I divided all of the splenocolic ligaments and was able to fully mobilized the splenic flexure of the colon.  The colon was inspected.  There was severe dilation of  the cecum progressing down to moderate dilation of the proximal descending colon.  The rectosigmoid colon appeared normal.  I decided to treat this like C. difficile colitis with a subtotal colectomy and end ileostomy.  I picked locations in the terminal ileum and the distal descending  colon to divide the intestine using a GIA 75 stapler.  The bowel was divided and the mesentery Coralyn Mark of the colon was divided using the bipolar energy.  The colon was passed off the field as a specimen.  The staple line of the long rectosigmoid stump was oversewn using 2-0 Vicryl suture.  A defect was made in the abdominal wall to the right and above the umbilicus overlying the rectus muscle.  This was a circular defect in the skin, a cruciate incision in the anterior rectus fascia, the rectus muscle was split along its muscle fibers, and an incision was made in the posterior rectus sheath and peritoneum.  The hole was dilated to the size of 1 finger.  The terminal ileum staple line was delivered through this hole.  The abdomen was irrigated with a liter of warm saline.  The midline fascia was closed using running 2-0 PDS suture.  The subcu was closed using interrupted 3-0 Vicryl suture.  The skin was closed using staples.  The ostomy was matured using 4 Brooke sutures, one in each cardinal direction and 2 sutures to reapproximate the mucocutaneous junction between each initial suture.  A sterile dressing was applied to the wound.  An ostomy appliance was applied to the ostomy site.  The patient tolerated the procedure well.  She remained intubated and was transferred the postanesthesia care unit in stable condition.  All sponge and needle counts were correct at the end of this case.  At the end of the case we reviewed the infection status of the case. Patient: Julie Montgomery Emergency General Surgery Service Patient Case: Emergent Infection Present At Time Of Surgery (PATOS):  Inflamed colon with pneumoperitoneum  Louanna Raw, MD General, Bariatric, & Minimally Invasive Surgery [facility], [place]

## 2022-03-21 NOTE — Progress Notes (Signed)
Nutrition Follow-up  DOCUMENTATION CODES:   Severe malnutrition in context of chronic illness, Underweight  INTERVENTION:  Increase TPN back to full rate to meet nutrition needs; management per pharmacy Will discontinue nutrition supplements, can re-order once pt's diet able to be resumed  NUTRITION DIAGNOSIS:   Severe Malnutrition related to chronic illness (recurrent c. diff) as evidenced by severe fat depletion, severe muscle depletion.  Ongoing-addressing via TPN  GOAL:   Patient will meet greater than or equal to 90% of their needs  Progressing, pt now NPO-plan to increase TPN to meet 100% of needs  MONITOR:   Diet advancement, Weight trends, Labs  REASON FOR ASSESSMENT:   Consult Calorie Count  ASSESSMENT:   Pt admitted with dehydration d/t diminished oral intake, found to have ileus. PMH significant for dementia, recurrent C. Diff requiring multiple prior admissions, paroxysmal afib, chronic hyponatremia, anemia of chronic disease.  11/11- PICC line placed, TPN initiated   11/22- s/p exlap, subtotal colectomy with end ileostomy  Pt with increased abdominal pain and distension over night. S/p KUB with finding of pneumoperitoneum.   Pt has alternated between NPO and a diet throughout admission. ST assessed yesterday and upgraded diet from dysphagia 3 to regular. She was meeting her nutrition needs and started weaning TPN yesterday. Discussed with MD today increasing TPN back to full rate to meet nutrition needs as pt is now NPO and on vent support.  Patient is currently intubated on ventilator support MV: 4.3 L/min Temp (24hrs), Avg:97.7 F (36.5 C), Min:97.5 F (36.4 C), Max:98.5 F (36.9 C)  Propofol: 5.43m/hr (calories not counted)  Medications: lasix, SSI 0-15 units q4h, medihoney, Vitamin D q7 days IV drips: neo @ 440mhr, zosyn  Labs: CBG's 123-199 x24 hours  Diet Order:   Diet Order             Diet NPO time specified  Diet effective now                    EDUCATION NEEDS:   Education needs have been addressed  Skin:  Skin Assessment: Skin Integrity Issues: Skin Integrity Issues:: Incisions, Unstageable DTI: sacrum Incisions: abdomen Other: non-pressure wound bilateral feet  Last BM:  11/21  Height:   Ht Readings from Last 1 Encounters:  03/05/22 '5\' 2"'$  (1.575 m)    Weight:   Wt Readings from Last 1 Encounters:  03/21/22 42.3 kg   BMI:  Body mass index is 17.06 kg/m.  Estimated Nutritional Needs:   Kcal:  1400-1600  Protein:  70-85g  Fluid:  1.4-1.6L  AlClayborne DanaRDN, LDN Clinical Nutrition

## 2022-03-21 NOTE — Progress Notes (Signed)
Pt is c/o severe abdominal pain. Abdomen is distended & tense (+) bowel sounds. Tylenol & Maalox given. Dr Myna Hidalgo informed. Bladder scan shows 336 OU.  In & out done with ou of 560. Pt still yelling with pain. Fentanyl x 1 dose given & KUB done as ordered. Dr Myna Hidalgo came & assessed the patient. Rectal tube taken out as ordered. Surgery came to reassessed & spoke to the pt.  Will keep pt NPO per Dr Myna Hidalgo.

## 2022-03-21 NOTE — Procedures (Signed)
Extubation Procedure Note  Patient Details:   Name: Julie Montgomery DOB: 06/06/1935 MRN: [id number]   Airway Documentation:    Vent end date: 03/21/22 Vent end time: 1730   Evaluation  O2 sats: stable throughout Complications: No apparent complications Patient did tolerate procedure well. Bilateral Breath Sounds: Clear   Yes  Cuff leak positive. No stridor noted.  [name] 03/21/2022, 5:36 PM

## 2022-03-21 NOTE — Progress Notes (Signed)
[facility] for Infectious Disease    Date of Admission:  03/05/2022     ID: Julie Montgomery is a 86 y.o. female with  recurrent cdifficile colitis with ileus, pneumoperitonea requiring emergent subtotal colectomy with end ileostomy Principal Problem:   Ileus ([place]) Active Problems:   Goals of care, counseling/discussion   Dehydration   Protein calorie malnutrition (HCC)   Paroxysmal atrial fibrillation (HCC)   Recurrent Clostridioides difficile infection   C. difficile diarrhea   Anemia of chronic disease   Protein-calorie malnutrition, severe   Abnormal x-ray of abdomen   Adynamic ileus ([place])   Last seen by ID on 11/11 at that time treated for c.difficile but also had persistent colonic distention and ileus. GIP found ETEC and given 1 dose of azithromycin. Repeat cdifficile panel on 11/10 -for cdifficile but still not significant improvement. She underwent felx sigmoidoscopy which found moderate diverticulosis. She has been on TPN since 11/11 to help with severe protein calorie malnutrition. She has been off of cdiff treatment -prolonged taper on 11/15.GI following and attempting to get approval for FMT medication as outpatient. Her WBC started to increase on 11/19 from 16.7K with no fevers or other overt signs of infection. On evening of 11/21, she had acute onset of severe abdominal pain, found to have pneumoperitoneum with free air on abd xray. She was emergently taken to the OR for subtotal colectomy and end ileostomy.OP note comments on massively dilated colon and severely dilated cecum to prox descending colon.she had resection from terminal ileum to distal descending colon with a long rectosigmoid stump. ID asked to weigh on cdiff treatment  Subjective: Remains intubated  Medications:   amiodarone  200 mg Per Tube Daily   Chlorhexidine Gluconate Cloth  6 each Topical Daily   furosemide  40 mg Intravenous Daily   Gerhardt's butt cream   Topical BID   heparin injection  (subcutaneous)  5,000 Units Subcutaneous Q8H   insulin aspart  0-15 Units Subcutaneous Q4H   leptospermum manuka honey  1 Application Topical Daily   lidocaine  1 patch Transdermal Q24H   memantine  5 mg Per Tube BID   sodium chloride flush  10-40 mL Intracatheter Q12H   Vitamin D (Ergocalciferol)  50,000 Units Oral Q7 days    Objective: Vital signs in last 24 hours: Temp:  [97.5 F (36.4 C)-98.5 F (36.9 C)] 97.5 F (36.4 C) (11/22 1117) Pulse Rate:  [53-84] 61 (11/22 1000) Resp:  [11-24] 20 (11/22 1300) BP: (92-129)/(46-70) 111/48 (11/22 1300) SpO2:  [94 %-100 %] 100 % (11/22 1000) FiO2 (%):  [40 %] 40 % (11/22 0927) Weight:  [42.3 kg] 42.3 kg (11/22 0500) Physical Exam  Constitutional:  oriented to person, still intubated but alert. appears frail and mal-nourished. No distress.  HENT: [place]/AT, PERRLA, no scleral icterus Mouth/Throat: Oropharynx is clear and moist. No oropharyngeal exudate.  Cardiovascular: Normal rate, regular rhythm and normal heart sounds. Exam reveals no gallop and no friction rub.  No murmur heard.  Pulmonary/Chest: Effort normal and breath sounds normal. No respiratory distress.  has no wheezes.  Neck = supple, no nuchal rigidity Abdominal: Soft. Bowel sounds are normal.  exhibits no distension. There is no tenderness. Ostomy in place serous fluid Lymphadenopathy: no cervical adenopathy. No axillary adenopathy Neurological: alert and oriented to person, place, and time.  Skin: Skin is warm and dry. No rash noted. No erythema.  Psychiatric: a normal mood and affect.  behavior is normal.    Lab Results  Recent Labs    03/20/22 0508 03/20/22 1840 03/21/22 0635 03/21/22 0737  WBC 15.5*  --   --  41.5*  HGB 9.6*  --  9.9* 9.8*  HCT 29.5*  --  29.0* 30.2*  NA 138 139 139 138  K 4.3 4.5 4.5 4.7  CL 100 104  --  104  CO2 28 28  --  26  BUN 36* 50*  --  48*  CREATININE 0.30* 0.42*  --  0.57   Liver Panel Recent Labs    03/19/22 0350 03/21/22 0737   PROT 5.7* 5.7*  ALBUMIN 2.8* 3.3*  AST 38 37  ALT 50* 62*  ALKPHOS 91 77  BILITOT 0.3 0.5    Microbiology: reviewed Studies/Results: DG Abd Portable 1V  Addendum Date: 03/21/2022   ADDENDUM REPORT: 03/21/2022 00:31 ADDENDUM: These results were called by telephone at the time of interpretation on 03/20/2022 at 11:35 pm to provider TIMOTHY OPYD , who verbally acknowledged these results. Electronically Signed   By: Ronney Asters M.D.   On: 03/21/2022 00:31   Result Date: 03/21/2022 CLINICAL DATA:  Abdominal pain EXAM: PORTABLE ABDOMEN - 1 VIEW COMPARISON:  Abdominal x-ray 03/16/2022. CT abdomen and pelvis 03/05/2022 FINDINGS: There is a new large amount of free intraperitoneal air. There are dilated loops of bowel in the central abdomen measuring up to 10 cm. Minimal colonic gas and rectal gas present. No suspicious calcifications. Lung bases are clear. No acute fractures are identified. IMPRESSION: 1. New large amount of free intraperitoneal air. 2. Dilated loops of bowel in the central abdomen measuring up to 10 cm, likely colon. Electronically Signed: By: Ronney Asters M.D. On: 03/20/2022 23:15   DG CHEST PORT 1 VIEW  Result Date: 03/20/2022 CLINICAL DATA:  Shortness of breath EXAM: PORTABLE CHEST 1 VIEW COMPARISON:  Previous studies including the examination of 03/19/2022 FINDINGS: Cardiac size is within normal limits. Tip of right PICC line is seen in superior vena cava. There is interval decrease in interstitial markings in parahilar regions and lower lung fields suggesting further clearing of pulmonary edema. No new focal infiltrates are seen. Small bilateral pleural effusions are seen. There is no pneumothorax. Degenerative changes are noted in right shoulder. IMPRESSION: There is decrease in interstitial markings in both lungs suggesting clearing of pulmonary edema. No new focal infiltrates are seen. Small bilateral pleural effusions are seen. Electronically Signed   By: Elmer Picker M.D.   On: 03/20/2022 12:37     Assessment/Plan: Perforated abdomen from probable cdifficile toxic megacolon. = once can establish oral route, would resume oral vancomycin '125mg'$  QID plus IV metronidazole  Post op coverage from colonic perforation = currently on piptazo (despite double anaerobic coverage) Would stop administration in 4 days since had quick source control and was able to go to surgery for colectomy  She is at higher risk for ssi due to malnutrition status.  Will continue to follow closely  [facility] for Infectious Diseases Pager: [phone]  03/21/2022, 2:40 PM

## 2022-03-21 NOTE — Progress Notes (Signed)
Currently intubated on PCCM's service.  Will sign off at this time.  Please let us know when we can be of further assistance. TRH Fayrene Helper, MD

## 2022-03-22 ENCOUNTER — Inpatient Hospital Stay (HOSPITAL_COMMUNITY): Payer: Medicare Other

## 2022-03-22 ENCOUNTER — Encounter (HOSPITAL_COMMUNITY): Payer: Self-pay | Admitting: Surgery

## 2022-03-22 DIAGNOSIS — K659 Peritonitis, unspecified: Secondary | ICD-10-CM | POA: Diagnosis not present

## 2022-03-22 DIAGNOSIS — A0471 Enterocolitis due to Clostridium difficile, recurrent: Secondary | ICD-10-CM

## 2022-03-22 DIAGNOSIS — K567 Ileus, unspecified: Secondary | ICD-10-CM | POA: Diagnosis not present

## 2022-03-22 LAB — GLUCOSE, CAPILLARY
Glucose-Capillary: 182 mg/dL — ABNORMAL HIGH (ref 70–99)
Glucose-Capillary: 139 mg/dL — ABNORMAL HIGH (ref 70–99)
Glucose-Capillary: 145 mg/dL — ABNORMAL HIGH (ref 70–99)
Glucose-Capillary: 132 mg/dL — ABNORMAL HIGH (ref 70–99)
Glucose-Capillary: 109 mg/dL — ABNORMAL HIGH (ref 70–99)
Glucose-Capillary: 181 mg/dL — ABNORMAL HIGH (ref 70–99)

## 2022-03-22 LAB — COMPREHENSIVE METABOLIC PANEL
ALT: 41 U/L (ref 0–44)
AST: 22 U/L (ref 15–41)
Albumin: 2.8 g/dL — ABNORMAL LOW (ref 3.5–5.0)
Alkaline Phosphatase: 73 U/L (ref 38–126)
Anion gap: 7 (ref 5–15)
BUN: 31 mg/dL — ABNORMAL HIGH (ref 8–23)
CO2: 27 mmol/L (ref 22–32)
Calcium: 8.6 mg/dL — ABNORMAL LOW (ref 8.9–10.3)
Chloride: 109 mmol/L (ref 98–111)
Creatinine, Ser: 0.42 mg/dL — ABNORMAL LOW (ref 0.44–1.00)
GFR, Estimated: >60 mL/min (ref >60)
Glucose, Bld: 176 mg/dL — ABNORMAL HIGH (ref 70–99)
Potassium: 4.1 mmol/L (ref 3.5–5.1)
Sodium: 143 mmol/L (ref 135–145)
Total Bilirubin: 0.3 mg/dL (ref 0.3–1.2)
Total Protein: 5.3 g/dL — ABNORMAL LOW (ref 6.5–8.1)

## 2022-03-22 LAB — CBC WITH DIFFERENTIAL/PLATELET
Abs Immature Granulocytes: 0.18 10*3/uL — ABNORMAL HIGH (ref 0.00–0.07)
Basophils Absolute: 0 10*3/uL (ref 0.0–0.1)
Basophils Relative: 0 %
Eosinophils Absolute: 0.1 10*3/uL (ref 0.0–0.5)
Eosinophils Relative: 1 %
HCT: 27.9 % — ABNORMAL LOW (ref 36.0–46.0)
Hemoglobin: 8.8 g/dL — ABNORMAL LOW (ref 12.0–15.0)
Immature Granulocytes: 1 %
Lymphocytes Relative: 3 %
Lymphs Abs: 0.7 10*3/uL (ref 0.7–4.0)
MCH: 30.4 pg (ref 26.0–34.0)
MCHC: 31.5 g/dL (ref 30.0–36.0)
MCV: 96.5 fL (ref 80.0–100.0)
Monocytes Absolute: 0.9 10*3/uL (ref 0.1–1.0)
Monocytes Relative: 4 %
Neutro Abs: 20.9 10*3/uL — ABNORMAL HIGH (ref 1.7–7.7)
Neutrophils Relative %: 91 %
Platelets: 384 10*3/uL (ref 150–400)
RBC: 2.89 MIL/uL — ABNORMAL LOW (ref 3.87–5.11)
RDW: 17.7 % — ABNORMAL HIGH (ref 11.5–15.5)
WBC: 22.9 10*3/uL — ABNORMAL HIGH (ref 4.0–10.5)
nRBC: 0 % (ref 0.0–0.2)

## 2022-03-22 LAB — APTT
aPTT: 44 seconds — ABNORMAL HIGH (ref 24–36)
aPTT: 47 seconds — ABNORMAL HIGH (ref 24–36)

## 2022-03-22 LAB — MAGNESIUM: Magnesium: 2 mg/dL (ref 1.7–2.4)

## 2022-03-22 LAB — TRIGLYCERIDES: Triglycerides: 32 mg/dL (ref <150)

## 2022-03-22 LAB — PHOSPHORUS: Phosphorus: 2.8 mg/dL (ref 2.5–4.6)

## 2022-03-22 LAB — VITAMIN C: Vitamin C: 0.9 mg/dL (ref 0.4–2.0)

## 2022-03-22 LAB — HEPARIN LEVEL (UNFRACTIONATED)
Heparin Unfractionated: 0.38 IU/mL (ref 0.30–0.70)
Heparin Unfractionated: 0.22 IU/mL — ABNORMAL LOW (ref 0.30–0.70)

## 2022-03-22 LAB — VITAMIN B1: Vitamin B1 (Thiamine): 201.8 nmol/L — ABNORMAL HIGH (ref 66.5–200.0)

## 2022-03-22 MED ORDER — VANCOMYCIN HCL 125 MG PO CAPS: 125.0000 mg | ORAL_CAPSULE | Freq: Four times a day (QID) | ORAL | Status: DC

## 2022-03-22 MED ORDER — TRACE MINERALS CU-MN-SE-ZN 300-55-60-3000 MCG/ML IV SOLN
INTRAVENOUS | Status: AC
  Filled 2022-03-22: qty 464

## 2022-03-22 MED ORDER — VANCOMYCIN HCL 125 MG PO CAPS: 125.0000 mg | ORAL_CAPSULE | Freq: Every day | ORAL | Status: DC

## 2022-03-22 MED ORDER — FENTANYL CITRATE PF 50 MCG/ML IJ SOSY
12.5000 ug | PREFILLED_SYRINGE | INTRAMUSCULAR | Status: DC | PRN
  Administered 2022-03-22 – 2022-03-24 (×4): 12.5 ug via INTRAVENOUS
  Filled 2022-03-22 (×4): qty 1

## 2022-03-22 MED ORDER — VANCOMYCIN HCL 125 MG PO CAPS
125.0000 mg | ORAL_CAPSULE | Freq: Four times a day (QID) | ORAL | Status: DC
  Administered 2022-03-22 – 2022-03-23 (×4): 125 mg via ORAL
  Filled 2022-03-22 (×7): qty 1

## 2022-03-22 MED ORDER — MEMANTINE HCL 10 MG PO TABS
5.0000 mg | ORAL_TABLET | Freq: Two times a day (BID) | ORAL | Status: DC
  Administered 2022-03-22 – 2022-03-30 (×17): 5 mg via ORAL
  Filled 2022-03-22 (×18): qty 1

## 2022-03-22 MED ORDER — DILTIAZEM HCL 30 MG PO TABS
30.0000 mg | ORAL_TABLET | Freq: Three times a day (TID) | ORAL | Status: DC
  Administered 2022-03-22: 30 mg via ORAL
  Filled 2022-03-22 (×2): qty 1

## 2022-03-22 MED ORDER — VANCOMYCIN HCL 125 MG PO CAPS: 125.0000 mg | ORAL_CAPSULE | ORAL | Status: DC

## 2022-03-22 MED ORDER — VANCOMYCIN HCL 125 MG PO CAPS: 125.0000 mg | ORAL_CAPSULE | Freq: Two times a day (BID) | ORAL | Status: DC

## 2022-03-22 MED ORDER — HEPARIN BOLUS VIA INFUSION
1000.0000 [IU] | Freq: Once | INTRAVENOUS | Status: AC
  Administered 2022-03-22: 1000 [IU] via INTRAVENOUS
  Filled 2022-03-22: qty 1000

## 2022-03-22 NOTE — Progress Notes (Signed)
ANTICOAGULATION CONSULT NOTE- follow-up  Pharmacy Consult for IV heparin Indication: atrial fibrillation  Allergies  Allergen Reactions   Lactose Intolerance (Gi) Other (See Comments)    Intolerance    Lovastatin Other (See Comments)    Unknown reaction   Mometasone Furo-Formoterol Fum Other (See Comments)    Loss of appetite, laryngitis    Peanut-Containing Drug Products Other (See Comments)   Prozac [Fluoxetine Hcl] Other (See Comments)    Jumpy   Sulfa Antibiotics Other (See Comments)    Unknown reaction    Patient Measurements: Height: '5\' 2"'$  (157.5 cm) Weight: 43.1 kg (95 lb 0.3 oz) IBW/kg (Calculated) : 50.1 Heparin Dosing Weight: 42.3 kg  Vital Signs: BP: 122/51 (11/23 1900) Pulse Rate: 86 (11/23 1900)  Labs: Recent Labs    03/20/22 0508 03/20/22 1840 03/21/22 0140 03/21/22 0635 03/21/22 0737 03/21/22 2155 03/22/22 0557 03/22/22 1843  HGB 9.6*  --   --  9.9* 9.8*  --  8.8*  --   HCT 29.5*  --   --  29.0* 30.2*  --  27.9*  --   PLT 465*  --   --   --  519*  --  384  --   APTT  --   --  24  --   --   --  44* 47*  LABPROT  --   --  14.5  --   --   --   --   --   INR  --   --  1.1  --   --   --   --   --   HEPARINUNFRC  --   --  >1.10*  --   --   --  0.38 0.22*  CREATININE 0.30*   < >  --   --  0.57 0.53 0.42*  --    < > = values in this interval not displayed.    Estimated Creatinine Clearance: 35.6 mL/min (A) (by C-G formula based on SCr of 0.42 mg/dL (L)).   Medical History: Past Medical History:  Diagnosis Date   Adenomatous polyp of colon 2020   Allergy    Anxiety    Basal cell carcinoma (BCC)    Cataract    Depression    Helicobacter pylori gastritis    Osteopenia    Paroxysmal atrial fibrillation (HCC)    Pneumonia    hx of 03/2008   Stroke [facility])    TIA long time ago    Medications:  Infusions:   amiodarone 30 mg/hr (03/22/22 1900)   heparin 600 Units/hr (03/22/22 1900)   metronidazole Stopped (03/22/22 1425)    piperacillin-tazobactam (ZOSYN)  IV 12.5 mL/hr at 03/22/22 1900   TPN ADULT (ION) 50 mL/hr at 03/22/22 1900    Assessment: 86 yo female on chronic Eliquis PTA for afib.  Eliquis on hold currently and pharmacy asked to start IV heparin as a bridge.    Last Eliquis dose 11/21) ~ 9 pm. '@2100'$ . Received 60 units/kg Kcentra on 11/22 '@0200'$ .   Heparin level 0.38 units/mL and aPTT 44. Since patient received Kcentra, it's hard to tell if the heparin level is falsely elevated or indicative of anticoagulant effect. Since aPTT and heparin levels are not exactly correlating, will adjust based on aPTT being subtherapeutic.  11/232023 PM update: aPTT: 47 seconds / HL 0.22 No signs of bleeding No issues with the infusion  Goal of Therapy:  Heparin level 0.3-0.7 units/ml APTT 66-102 Monitor platelets by anticoagulation protocol: Yes   Plan:  Heparin bolus 100 units f/b Increase heparin to 750 units/hr Check 8hr aPTT (with AM labs) Daily aptt, heparin level and CBC. F/u ability to resume Eliquis eventually.  Vaughan Basta BS, PharmD, BCPS Clinical Pharmacist 03/22/2022 7:45 PM  Contact: [phone] after 3 PM  "Be curious, not judgmental..." -Jamal Maes

## 2022-03-22 NOTE — Evaluation (Signed)
Clinical/Bedside Swallow Evaluation Patient Details  Name: Julie Montgomery MRN: [id number] Date of Birth: Sep 05, 1935  Today's Date: 03/22/2022 Time: SLP Start Time (ACUTE ONLY): 1152 SLP Stop Time (ACUTE ONLY): 1203 SLP Time Calculation (min) (ACUTE ONLY): 11 min  Past Medical History:  Past Medical History:  Diagnosis Date   Adenomatous polyp of colon 2020   Allergy    Anxiety    Basal cell carcinoma (BCC)    Cataract    Depression    Helicobacter pylori gastritis    Osteopenia    Paroxysmal atrial fibrillation (HCC)    Pneumonia    hx of 03/2008   Stroke [facility])    TIA long time ago   Past Surgical History:  Past Surgical History:  Procedure Laterality Date   APPENDECTOMY     BIOPSY  03/15/2022   Procedure: BIOPSY;  Surgeon: Jerene Bears, MD;  Location: [facility];  Service: Gastroenterology;;   BREAST LUMPECTOMY     right   CATARACT EXTRACTION W/ INTRAOCULAR LENS  IMPLANT, BILATERAL Bilateral    CESAREAN SECTION     CHOLECYSTECTOMY     FLEXIBLE SIGMOIDOSCOPY N/A 03/15/2022   Procedure: FLEXIBLE SIGMOIDOSCOPY;  Surgeon: Jerene Bears, MD;  Location: [place];  Service: Gastroenterology;  Laterality: N/A;   LAPAROTOMY N/A 03/21/2022   Procedure: EXPLORATORY LAPAROTOMY,subtotal colectomy with end ileostomy;  Surgeon: Felicie Morn, MD;  Location: [place];  Service: General;  Laterality: N/A;   MOHS SURGERY     PARTIAL HYSTERECTOMY     ROTATOR CUFF REPAIR  2012   SKIN GRAFT     teeth remmoved     HPI:  Pt is an 86 year old woman who came to the ED on11/06 with distended abdomen, LE swelling, poor PO intake with dehydration. She was discharged 4 days prior with c-diff. Dx c-diff and colonic ileus. TPN; recently advanced to dysphagia 3 diet. PMH: CHF, dementia, PAF, chronic hyponatremia, anemia, TIA, osteopenia, severe protein calorie malnutrition, FTT. On 11/21 she experienced severely worsening upper abdominal pain and KUB showed large amount of  intra-peritoneal air.   Surgery was consulted and pt underwent subtotal colectomy with end ileostomy on 11/22.  She was extubated 11/22 and failed the [facility] screen 11/23.    Assessment / Plan / Recommendation  Clinical Impression  Julie Montgomery participated in swallow assessment s/p surgery and short intubation.  She failed the [facility] swallow screen today due to inability to consume sequential sips.  Her swallow was evaluated on 11/21 - at that time, regular solids/thin liquids were recommended. She had dyspnea and required frequent breaks; drank carefully and one sip at a time.  Today, assessment was limited to ice chips/water - her function was consistent with baseline with no concerns for an acute dysphagia.  She continues to self-limit sips of water and required rest breaks. There were no s/s of aspiration. Recommend clear liquids per surgery and PO meds.  No further SLP f/u warranted - advance diet per surgery. SLP will sign off. SLP Visit Diagnosis: Dysphagia, unspecified (R13.10)    Aspiration Risk  No limitations    Diet Recommendation   Per surgery  Medication Administration: Whole meds with liquid    Other  Recommendations Oral Care Recommendations: Oral care BID    Recommendations for follow up therapy are one component of a multi-disciplinary discharge planning process, led by the attending physician.  Recommendations may be updated based on patient status, additional functional criteria and insurance authorization.  Follow up Recommendations No  SLP follow up        [place] Date of Onset: 03/20/22 HPI: Pt is an 86 year old woman who came to the ED on11/06 with distended abdomen, LE swelling, poor PO intake with dehydration. She was discharged 4 days prior with c-diff. Dx c-diff and colonic ileus. TPN; recently advanced to dysphagia 3 diet. PMH: CHF, dementia, PAF, chronic hyponatremia, anemia, TIA, osteopenia, severe protein calorie malnutrition, FTT. On 11/21  she experienced severely worsening upper abdominal pain and KUB showed large amount of intra-peritoneal air.   Surgery was consulted and pt underwent subtotal colectomy with end ileostomy on 11/22.  She was extubated 11/22 and failed the [facility] screen 11/23. Type of Study: Bedside Swallow Evaluation Previous Swallow Assessment: 03/20/22 Diet Prior to this Study: NPO Temperature Spikes Noted: No Respiratory Status: Room air;Nasal cannula History of Recent Intubation: Yes Length of Intubations (days): 1 days Date extubated: 03/21/22 Behavior/Cognition: Alert;Confused Oral Cavity Assessment: Within Functional Limits Oral Care Completed by SLP: Recent completion by staff Oral Cavity - Dentition: Edentulous;Dentures, not available Self-Feeding Abilities: Needs assist Patient Positioning: Upright in bed Baseline Vocal Quality: Normal Volitional Cough: Strong Volitional Swallow: Able to elicit    Oral/Motor/Sensory Function Overall Oral Motor/Sensory Function: Within functional limits   Ice Chips Ice chips: Within functional limits   Thin Liquid Thin Liquid: Within functional limits    Nectar Thick Nectar Thick Liquid: Not tested   Honey Thick Honey Thick Liquid: Not tested   Puree Puree: Not tested   Solid     Solid: Not tested      Juan Quam Laurice 03/22/2022,12:14 PM  Estill Bamberg L. Tivis Ringer, MA CCC/SLP Clinical Specialist - [place] Office number [phone]

## 2022-03-22 NOTE — Progress Notes (Signed)
[facility] Progress Note Patient Name: Julie Montgomery DOB: 1936/02/21 MRN: [id number]   Date of Service  03/22/2022  HPI/Events of Note  Back in Afib with RVR HR 150s still on amio gttat maintainence rate ('30mg'$ ), no PRNS.  BP 144/72 recently off pressors Takes Cardizem CD 180 OD at home, not yet resumed No significant electrolyte abnormality from this morning's labs  eICU Interventions  Ordered to start short acting cardizem at a lower dose for now given recent pressor requirement. Bedside team to reassess if home cardizem may be resumed Ordered electrolytes for tonight as patient is receiving diuretics Discussed with bedside RN     Intervention Category Intermediate Interventions: Arrhythmia - evaluation and management  Judd Lien 03/22/2022, 10:47 PM

## 2022-03-22 NOTE — H&P (Addendum)
NAME:  Julie Montgomery, MRN:  [id number], DOB:  [birth year], LOS: 54 ADMISSION DATE:  03/05/2022, CONSULTATION DATE:  03/22/22 REFERRING MD:  , CHIEF COMPLAINT:  vent dependence post op   BRIEF  Julie Montgomery is a 86 y.o. F with PMH signficant for dementia, recurrent C. Diff, Atrial fibrillation on Eliquis, chronic hyponatremia who was initially admitted 11/6 after several days of poor po intake.  She has been hospitalized four times in 2023 and was discharged home 11/1 with oral Vancomycin for C. Diff.  Sh was admitted for hydration and further work-up , CT abdomen showed abdominal distension without perforation.  On 11/21 she experienced severely worsening upper abdominal pain and KUB showed large amount of intra-peritoneal air.   Surgery was consulted and pt underwent subtotal colectomy with end ileostomy on 11/22.  She was left intubated post-op and PCCM consulted for vent management.  Pertinent  Medical History  C diff, recurrent Severe malnutrition pAF on eliquis Stroke Colon polyp H pylori Dementia   Significant Hospital Events: Including procedures, antibiotic start and stop dates in addition to other pertinent events   11/6 admitted for FTT - BMI 17 11/9 started TPN 11/16 flex sig> congested mucosa, diverticula. Path not consistent with microscopic colitis or IBD.   11/22 perforated> colon resection with diverting ileostomy -> In ICU post-op intubated, requiring sedation due to agitation. ID supect perf abd from toxic megacolon. Rx resume oral vanc when posssible + cont IV flagyl and' \\ost'$  op zosyn + - ID recommendng 4d only Extubated in PM  Interim History / Subjective:   11/23 - agitated overngihtb ut now calm and oriented. Marland Kitchen Restrains applied. A Fib overnight -> Po amiop changed to IV amio and IV heparin stgarted.t, On amio gtt. On heparin gtt. On TPN. On Zosyn and flagyl. Also on lasix. AFbbre. WBC coming down. OFF NEO.  NG being clamped and if ok for 6h- > can feed per GI.  No diarrhea. No bleeding. No vomiting.  4LNC -> pulse ox 100% post extubation  Objective   Blood pressure (!) 105/52, pulse 90, temperature 98 F (36.7 C), temperature source Oral, resp. rate 13, height '5\' 2"'$  (1.575 m), weight 43.1 kg, SpO2 97 %.    Vent Mode: Stand-by FiO2 (%):  [40 %] 40 % Set Rate:  [20 bmp] 20 bmp Vt Set:  [400 mL] 400 mL PEEP:  [5 cmH20] 5 cmH20 Pressure Support:  [10 cmH20] 10 cmH20 Plateau Pressure:  [19 cmH20] 19 cmH20   Intake/Output Summary (Last 24 hours) at 03/22/2022 0911 Last data filed at 03/22/2022 0600 Gross per 24 hour  Intake 3331.14 ml  Output 2345 ml  Net 986.14 ml   Filed Weights   03/19/22 0416 03/21/22 0500 03/22/22 0500  Weight: 48.5 kg 42.3 kg 43.1 kg    Examination: General: No distress. EXTREME FRAIL, CACHEXIA Neuro: Alert and Oriented x 3. GCS 15. Speech normal Psych: Pleasant Resp:  Barrel Chest - no.  Wheeze - no, Crackles - no, No overt respiratory distress CVS: Normal heart sounds. Murmurs - no Ext: Stigmata of Connective Tissue Disease - no HEENT: Normal upper airway. PEERL +. No post nasal drip ABD Soft. Non tendr. Has ostomy   Resolved Hospital Problem list   Post op resp failure - extubated 11/22  Assessment & Plan:  Acute bowel perforation, suspect related to severely dilated colon, s/p colectomy and diverting ileostomy.   11/23 - ileostomy without diarrhea or bleeding. NG tube clamped and so far doing well  PLAN -appreciate surgery's management -TPN to meet full nutritional needs, NPO until progressed by surgery -pain control as needed -zosyn for contaminated abdomen 11/22 x 4 days only per ID  C diff colitis  11/23 - no diarrhea  Plan  - PO vanc per ID - start 11/23  - IV flagyl per ID   Shock likely 2/2 sedation, possibly due to sepsis from peritonitis  (s/P PICC)  11/23 - off pressors  Plan  - dc neo off mar  - sbp > 105 and MAP > 60    Paroxysmal Afib  11/23 - ovenguigth A Fib RVR and on  amio gtt and back on anticoag with IV heparin  plan -ON IV heparin gtt (CCS aware) -con't amio  = check bnp - check tro[p - check echo  Post-op vent management - acute resp failure   11/23 - exubgated successfully yesterday. 4L [place] today  Plan  - goal pulse ox > 88% - cotninue diuresis   Hypergylcemia -SSI PRN -goal BG 140-180   Anemia chronic disease and criticall illness  - no active bleeding  Plan  - - PRBC for hgb </= 6.9gm%    - exceptions are   -  if ACS susepcted/confirmed then transfuse for hgb </= 8.0gm%,  or    -  active bleeding with hemodynamic instability, then transfuse regardless of hemoglobin value   At at all times try to transfuse 1 unit prbc as possible with exception of active hemorrhage   Severe protein energy malnutrition  plan -TPN, bowel rest  Best Practice (right click and "Reselect all SmartList Selections" daily)   Diet/type: TPN DVT prophylaxis: prophylactic heparin  GI prophylaxis: H2B (avoid PPI) Lines: Central line [facility]) Foley:  Yes, and it is still needed Code Status:  full code Last date of multidisciplinary goals of care discussion [ no family at bedside at present]  - called son [name] - later 03/22/2022 - move to sDu and ask TRiad to take over 03/23/22 and ccm off - confirmed via secure chat from Dr Domenic Polite      ATTESTATION & SIGNATURE    Dr. Brand Males, M.D., F.C.C.P Pulmonary and Critical Care Medicine Medical Director - [facility] ICU Staff Physician, [facility] Pager: [phone], If no answer or between  15:00h - 7:00h: call [phone]  03/22/2022 9:12 AM   LABS    PULMONARY Recent Labs  Lab 03/21/22 0635  PHART 7.304*  PCO2ART 58.2*  PO2ART 134*  HCO3 28.9*  TCO2 31  O2SAT 99    CBC Recent Labs  Lab 03/20/22 0508 03/21/22 0635 03/21/22 0737 03/22/22 0557  HGB 9.6* 9.9* 9.8* 8.8*  HCT 29.5*  29.0* 30.2* 27.9*  WBC 15.5*  --  41.5* 22.9*  PLT 465*  --  519* 384    COAGULATION Recent Labs  Lab 03/21/22 0140  INR 1.1    CARDIAC  No results for input(s): "TROPONINI" in the last 168 hours. No results for input(s): "PROBNP" in the last 168 hours.   CHEMISTRY Recent Labs  Lab 03/16/22 0550 03/17/22 0535 03/18/22 0250 03/19/22 0350 03/20/22 0508 03/20/22 1840 03/21/22 0635 03/21/22 0737 03/21/22 2155 03/22/22 0557  NA 131* 134*   < > 138 138 139 139 138 142 143  K 4.7 4.7   < > 4.3 4.3 4.5 4.5 4.7 4.5 4.1  CL 95* 97*   < > 101 100  104  --  104 108 109  CO2 28 29   < > '30 28 28  '$ --  '26 28 27  '$ GLUCOSE 142* 181*   < > 119* 176* 110*  --  177* 220* 176*  BUN 16 20   < > 39* 36* 50*  --  48* 35* 31*  CREATININE 0.33* <0.30*   < > 0.47 0.30* 0.42*  --  0.57 0.53 0.42*  CALCIUM 9.0 9.5   < > 9.1 9.5 9.4  --  9.4 8.8* 8.6*  MG 2.0 2.1  --  2.1  --   --   --  2.1 2.1 2.0  PHOS 3.2  --   --  4.0  --   --   --  4.0  --  2.8   < > = values in this interval not displayed.   Estimated Creatinine Clearance: 35.6 mL/min (A) (by C-G formula based on SCr of 0.42 mg/dL (L)).   LIVER Recent Labs  Lab 03/16/22 0550 03/19/22 0350 03/21/22 0140 03/21/22 0737 03/22/22 0557  AST 21 38  --  37 22  ALT 21 50*  --  62* 41  ALKPHOS 81 91  --  77 73  BILITOT 0.2* 0.3  --  0.5 0.3  PROT 5.5* 5.7*  --  5.7* 5.3*  ALBUMIN 2.9* 2.8*  --  3.3* 2.8*  INR  --   --  1.1  --   --      INFECTIOUS No results for input(s): "LATICACIDVEN", "PROCALCITON" in the last 168 hours.   ENDOCRINE CBG (last 3)  Recent Labs    03/21/22 2331 03/22/22 0352 03/22/22 0752  GLUCAP 178* 182* 139*         IMAGING x48h  - image(s) personally visualized  -   highlighted in bold DG Abd Portable 1V  Result Date: 03/22/2022 CLINICAL DATA:  Encounter for nasogastric tube placement. EXAM: PORTABLE ABDOMEN - 1 VIEW COMPARISON:  03/20/2022 FINDINGS: Nasogastric tube tip is in the left abdomen  and likely within the stomach. Evidence for a PICC line tip in the SVC region but only partially imaged. Limited evaluation of the lung bases. Skin staples in the abdomen. Small amount of gas within the stomach. IMPRESSION: Nasogastric tube is in the left abdomen and expected region of the stomach. Electronically Signed   By: Markus Daft M.D.   On: 03/22/2022 07:56   DG Abd Portable 1V  Addendum Date: 03/21/2022   ADDENDUM REPORT: 03/21/2022 00:31 ADDENDUM: These results were called by telephone at the time of interpretation on 03/20/2022 at 11:35 pm to provider TIMOTHY OPYD , who verbally acknowledged these results. Electronically Signed   By: Ronney Asters M.D.   On: 03/21/2022 00:31   Result Date: 03/21/2022 CLINICAL DATA:  Abdominal pain EXAM: PORTABLE ABDOMEN - 1 VIEW COMPARISON:  Abdominal x-ray 03/16/2022. CT abdomen and pelvis 03/05/2022 FINDINGS: There is a new large amount of free intraperitoneal air. There are dilated loops of bowel in the central abdomen measuring up to 10 cm. Minimal colonic gas and rectal gas present. No suspicious calcifications. Lung bases are clear. No acute fractures are identified. IMPRESSION: 1. New large amount of free intraperitoneal air. 2. Dilated loops of bowel in the central abdomen measuring up to 10 cm, likely colon. Electronically Signed: By: Ronney Asters M.D. On: 03/20/2022 23:15   DG CHEST PORT 1 VIEW  Result Date: 03/20/2022 CLINICAL DATA:  Shortness of breath EXAM: PORTABLE CHEST 1 VIEW COMPARISON:  Previous  studies including the examination of 03/19/2022 FINDINGS: Cardiac size is within normal limits. Tip of right PICC line is seen in superior vena cava. There is interval decrease in interstitial markings in parahilar regions and lower lung fields suggesting further clearing of pulmonary edema. No new focal infiltrates are seen. Small bilateral pleural effusions are seen. There is no pneumothorax. Degenerative changes are noted in right shoulder.  IMPRESSION: There is decrease in interstitial markings in both lungs suggesting clearing of pulmonary edema. No new focal infiltrates are seen. Small bilateral pleural effusions are seen. Electronically Signed   By: Elmer Picker M.D.   On: 03/20/2022 12:37

## 2022-03-22 NOTE — Progress Notes (Addendum)
[facility] for Infectious Disease    Date of Admission:  03/05/2022     Julie Montgomery is a 86 y.o. female with  recurrent cdifficile colitis with ileus, pneumoperitonea requiring emergent subtotal colectomy with end ileostomy    Principal Problem:   Ileus ([place]) Active Problems:   Goals of care, counseling/discussion   Dehydration   Protein calorie malnutrition (HCC)   Paroxysmal atrial fibrillation (HCC)   Recurrent Clostridioides difficile infection   C. difficile diarrhea   Anemia of chronic disease   Protein-calorie malnutrition, severe   Abnormal x-ray of abdomen   Adynamic ileus [facility])   Patient s/p subcolectomy 11/21 Doing well today off pressor Minimal abd pain No fever Clamp trial planned  Afib with rvr overnight on amio  Subjective: Remains intubated  Medications:   Chlorhexidine Gluconate Cloth  6 each Topical Daily   furosemide  40 mg Intravenous Daily   Gerhardt's butt cream   Topical BID   insulin aspart  0-15 Units Subcutaneous Q4H   leptospermum manuka honey  1 Application Topical Daily   lidocaine  1 patch Transdermal Q24H   memantine  5 mg Oral BID   sodium chloride flush  10-40 mL Intracatheter Q12H   vancomycin  125 mg Oral QID   Vitamin D (Ergocalciferol)  50,000 Units Oral Q7 days    Objective: Vital signs in last 24 hours: Temp:  [98 F (36.7 C)-99.1 F (37.3 C)] 98 F (36.7 C) (11/23 0407) Pulse Rate:  [60-152] 90 (11/23 1230) Resp:  [0-30] 22 (11/23 1230) BP: (90-171)/(42-134) 135/55 (11/23 1230) SpO2:  [83 %-100 %] 94 % (11/23 1230) FiO2 (%):  [40 %] 40 % (11/22 1525) Weight:  [43.1 kg] 43.1 kg (11/23 0500) Physical Exam  General/constitutional: ill appearing but no distress; ngt in place; conversant; son and husband by bedside HEENT: Normocephalic, PER, Conj Clear CV: irr no mrg Lungs: normal respiratory effort Abd: Soft, Nontender; ileostomy with liquid stool and air; dressing clean/dry Ext: no edema Skin: No  Rash Neuro: nonfocal    Lab Results Recent Labs    03/21/22 0737 03/21/22 2155 03/22/22 0557  WBC 41.5*  --  22.9*  HGB 9.8*  --  8.8*  HCT 30.2*  --  27.9*  NA 138 142 143  K 4.7 4.5 4.1  CL 104 108 109  CO2 '26 28 27  '$ BUN 48* 35* 31*  CREATININE 0.57 0.53 0.42*   Liver Panel Recent Labs    03/21/22 0737 03/22/22 0557  PROT 5.7* 5.3*  ALBUMIN 3.3* 2.8*  AST 37 22  ALT 62* 41  ALKPHOS 77 73  BILITOT 0.5 0.3    Microbiology: reviewed Studies/Results: DG Abd Portable 1V  Result Date: 03/22/2022 CLINICAL DATA:  Encounter for nasogastric tube placement. EXAM: PORTABLE ABDOMEN - 1 VIEW COMPARISON:  03/20/2022 FINDINGS: Nasogastric tube tip is in the left abdomen and likely within the stomach. Evidence for a PICC line tip in the SVC region but only partially imaged. Limited evaluation of the lung bases. Skin staples in the abdomen. Small amount of gas within the stomach. IMPRESSION: Nasogastric tube is in the left abdomen and expected region of the stomach. Electronically Signed   By: Markus Daft M.D.   On: 03/22/2022 07:56   DG Abd Portable 1V  Addendum Date: 03/21/2022   ADDENDUM REPORT: 03/21/2022 00:31 ADDENDUM: These results were called by telephone at the time of interpretation on 03/20/2022 at 11:35 pm to provider TIMOTHY OPYD , who verbally  acknowledged these results. Electronically Signed   By: Ronney Asters M.D.   On: 03/21/2022 00:31   Result Date: 03/21/2022 CLINICAL DATA:  Abdominal pain EXAM: PORTABLE ABDOMEN - 1 VIEW COMPARISON:  Abdominal x-ray 03/16/2022. CT abdomen and pelvis 03/05/2022 FINDINGS: There is a new large amount of free intraperitoneal air. There are dilated loops of bowel in the central abdomen measuring up to 10 cm. Minimal colonic gas and rectal gas present. No suspicious calcifications. Lung bases are clear. No acute fractures are identified. IMPRESSION: 1. New large amount of free intraperitoneal air. 2. Dilated loops of bowel in the central  abdomen measuring up to 10 cm, likely colon. Electronically Signed: By: Ronney Asters M.D. On: 03/20/2022 23:15    Abx: Oral vanc 11/23-c Iv metronidazole 11/22-c  Piptazo 11/22-c   Last oral vanc course 10/31-11/17 with planned long tapered course for recurrent cdiff   Assessment/Plan:  Perforated abdomen from probable cdifficile toxic megacolon.  peritonitis S/p subtotal colectomy with end ileostomy 03/20/22 Tpn status   Interestingly 11/11 cdiff pcr was negative.  11/23 -- off pressors, minimal abd pain, no n/v, stool output not excessive via colostomy. Afebrile. Surgery team also planned clamping ngt trial and sips of clears -- all good sign At this time would continue oral vanc and iv metronidazole as above.   Agree that with source control could stop piptazo on 11/25, especially with concern for severe cdiff   Discussed with primary team      Jabier Mutton, [place] for [place] [phone]  pager   [phone] cell 03/22/2022, 1:23 PM   03/22/2022, 1:15 PM

## 2022-03-22 NOTE — Progress Notes (Signed)
[facility] Progress Note Patient Name: LEZA APSEY DOB: [birth year] MRN: [id number]   Date of Service  03/22/2022  HPI/Events of Note  Agitation - Patient pulling at NGT and lines. Nursing request for bilateral wrist restraints.   eICU Interventions  Plan: Bilateral soft wrist restraints X 6 hours.     Intervention Category Major Interventions: Delirium, psychosis, severe agitation - evaluation and management  Safal Halderman Eugene 03/22/2022, 3:55 AM

## 2022-03-22 NOTE — Progress Notes (Signed)
  Echocardiogram 2D Echocardiogram has been performed.  Julie Montgomery 03/22/2022, 4:04 PM

## 2022-03-22 NOTE — Progress Notes (Signed)
ANTICOAGULATION CONSULT NOTE  Pharmacy Consult for IV heparin Indication: atrial fibrillation  Allergies  Allergen Reactions   Lactose Intolerance (Gi) Other (See Comments)    Intolerance    Lovastatin Other (See Comments)    Unknown reaction   Mometasone Furo-Formoterol Fum Other (See Comments)    Loss of appetite, laryngitis    Peanut-Containing Drug Products Other (See Comments)   Prozac [Fluoxetine Hcl] Other (See Comments)    Jumpy   Sulfa Antibiotics Other (See Comments)    Unknown reaction    Patient Measurements: Height: '5\' 2"'$  (157.5 cm) Weight: 43.1 kg (95 lb 0.3 oz) IBW/kg (Calculated) : 50.1 Heparin Dosing Weight: 42.3 kg  Vital Signs: Temp: 98 F (36.7 C) (11/23 0407) Temp Source: Oral (11/23 0407) BP: 102/47 (11/23 0915) Pulse Rate: 89 (11/23 0815)  Labs: Recent Labs    03/20/22 0508 03/20/22 1840 03/21/22 0140 03/21/22 0635 03/21/22 0737 03/21/22 2155 03/22/22 0557  HGB 9.6*  --   --  9.9* 9.8*  --  8.8*  HCT 29.5*  --   --  29.0* 30.2*  --  27.9*  PLT 465*  --   --   --  519*  --  384  APTT  --   --  24  --   --   --  44*  LABPROT  --   --  14.5  --   --   --   --   INR  --   --  1.1  --   --   --   --   HEPARINUNFRC  --   --  >1.10*  --   --   --  0.38  CREATININE 0.30*   < >  --   --  0.57 0.53 0.42*   < > = values in this interval not displayed.     Estimated Creatinine Clearance: 35.6 mL/min (A) (by C-G formula based on SCr of 0.42 mg/dL (L)).   Medical History: Past Medical History:  Diagnosis Date   Adenomatous polyp of colon 2020   Allergy    Anxiety    Basal cell carcinoma (BCC)    Cataract    Depression    Helicobacter pylori gastritis    Osteopenia    Paroxysmal atrial fibrillation (HCC)    Pneumonia    hx of 03/2008   Stroke [facility])    TIA long time ago    Medications:  Infusions:   amiodarone 30 mg/hr (03/22/22 0900)   heparin 550 Units/hr (03/22/22 0900)   metronidazole Stopped (03/22/22 0659)    piperacillin-tazobactam (ZOSYN)  IV 12.5 mL/hr at 03/22/22 0900   TPN ADULT (ION) 50 mL/hr at 03/22/22 0900    Assessment: 86 yo female on chronic Eliquis PTA for afib.  Eliquis on hold currently and pharmacy asked to start IV heparin as a bridge.    Last Eliquis dose 11/21) ~ 9 pm. '@2100'$ . Received 60 units/kg Kcentra on 11/22 '@0200'$ .   Heparin level 0.38 units/mL and aPTT 44. Since patient received Kcentra, it's hard to tell if the heparin level is falsely elevated or indicative of anticoagulant effect. Since aPTT and heparin levels are not exactly correlating, will adjust based on aPTT being subtherapeutic.  Goal of Therapy:  Heparin level 0.3-0.7 units/ml APTT 66-102 Monitor platelets by anticoagulation protocol: Yes   Plan:  Increase heparin to 600 units/hr Check 8hr aPTT and heparin level at 1800 Daily aptt, heparin level and CBC. F/u ability to resume Eliquis eventually.  Erskine Speed, PharmD  Clinical Pharmacist 03/22/2022 9:48 AM

## 2022-03-22 NOTE — Progress Notes (Signed)
   Evening walk rounds  - doing stable  On amigo gtt Per RN< "she is appropriate for transfer out of ICU. She is off pressors. NGT is out. Doing great!   Plan   Move to SDU  Trh primar 03/23/22   SIGNATURE    Dr. Brand Males, M.D., F.C.C.P,  Pulmonary and Critical Care Medicine Staff Physician, [place] Director - Interstitial Lung Disease  Program  Medical Director - [facility] ICU [facility]monary [place] at [place], [place], [address]   Pager: [phone], If no answer  -[place] or [place] [phone] Telephone (clinical office): [phone] Telephone (research): [phone]  5:29 PM 03/22/2022

## 2022-03-22 NOTE — Progress Notes (Signed)
Progress Note  1 Day Post-Op  Subjective: Pt alert and oriented x3 but somewhat confused. Reports difficulty breathing from NGT. Restraints placed overnight.   Objective: Vital signs in last 24 hours: Temp:  [97.5 F (36.4 C)-99.1 F (37.3 C)] 98 F (36.7 C) (11/23 0407) Pulse Rate:  [60-152] 90 (11/23 0715) Resp:  [0-30] 13 (11/23 0730) BP: (90-151)/(42-134) 105/52 (11/23 0730) SpO2:  [83 %-100 %] 97 % (11/23 0715) FiO2 (%):  [40 %] 40 % (11/22 1525) Weight:  [43.1 kg] 43.1 kg (11/23 0500) Last BM Date : 03/20/22  Intake/Output from previous day: 11/22 0701 - 11/23 0700 In: 3338.8 [I.V.:1987.6; IV Piggyback:1351.1] Out: 2445 [Urine:2315; Stool:130] Intake/Output this shift: No intake/output data recorded.  PE: General: pleasant, WD, frail elderly female who is laying in bed in NAD Heart: regular, rate, and rhythm.   Lungs: CTAB, no wheezes, rhonchi, or rales noted.  Respiratory effort nonlabored Abd: soft, NT, mild distention, +BS, ileostomy viable with small amount liquid dark green output, surgical dressing intact    Lab Results:  Recent Labs    03/21/22 0737 03/22/22 0557  WBC 41.5* 22.9*  HGB 9.8* 8.8*  HCT 30.2* 27.9*  PLT 519* 384   BMET Recent Labs    03/21/22 2155 03/22/22 0557  NA 142 143  K 4.5 4.1  CL 108 109  CO2 28 27  GLUCOSE 220* 176*  BUN 35* 31*  CREATININE 0.53 0.42*  CALCIUM 8.8* 8.6*   PT/INR Recent Labs    03/21/22 0140  LABPROT 14.5  INR 1.1   CMP     Component Value Date/Time   NA 143 03/22/2022 0557   K 4.1 03/22/2022 0557   CL 109 03/22/2022 0557   CO2 27 03/22/2022 0557   GLUCOSE 176 (H) 03/22/2022 0557   GLUCOSE 99 05/14/2006 0941   BUN 31 (H) 03/22/2022 0557   CREATININE 0.42 (L) 03/22/2022 0557   CALCIUM 8.6 (L) 03/22/2022 0557   PROT 5.3 (L) 03/22/2022 0557   ALBUMIN 2.8 (L) 03/22/2022 0557   AST 22 03/22/2022 0557   ALT 41 03/22/2022 0557   ALKPHOS 73 03/22/2022 0557   BILITOT 0.3 03/22/2022 0557    GFRNONAA >60 03/22/2022 0557   GFRAA >60 07/02/2019 1151   Lipase     Component Value Date/Time   LIPASE 45 03/05/2022 1607       Studies/Results: DG Abd Portable 1V  Result Date: 03/22/2022 CLINICAL DATA:  Encounter for nasogastric tube placement. EXAM: PORTABLE ABDOMEN - 1 VIEW COMPARISON:  03/20/2022 FINDINGS: Nasogastric tube tip is in the left abdomen and likely within the stomach. Evidence for a PICC line tip in the SVC region but only partially imaged. Limited evaluation of the lung bases. Skin staples in the abdomen. Small amount of gas within the stomach. IMPRESSION: Nasogastric tube is in the left abdomen and expected region of the stomach. Electronically Signed   By: Markus Daft M.D.   On: 03/22/2022 07:56   DG Abd Portable 1V  Addendum Date: 03/21/2022   ADDENDUM REPORT: 03/21/2022 00:31 ADDENDUM: These results were called by telephone at the time of interpretation on 03/20/2022 at 11:35 pm to provider TIMOTHY OPYD , who verbally acknowledged these results. Electronically Signed   By: Ronney Asters M.D.   On: 03/21/2022 00:31   Result Date: 03/21/2022 CLINICAL DATA:  Abdominal pain EXAM: PORTABLE ABDOMEN - 1 VIEW COMPARISON:  Abdominal x-ray 03/16/2022. CT abdomen and pelvis 03/05/2022 FINDINGS: There is a new large amount of free  intraperitoneal air. There are dilated loops of bowel in the central abdomen measuring up to 10 cm. Minimal colonic gas and rectal gas present. No suspicious calcifications. Lung bases are clear. No acute fractures are identified. IMPRESSION: 1. New large amount of free intraperitoneal air. 2. Dilated loops of bowel in the central abdomen measuring up to 10 cm, likely colon. Electronically Signed: By: Ronney Asters M.D. On: 03/20/2022 23:15   DG CHEST PORT 1 VIEW  Result Date: 03/20/2022 CLINICAL DATA:  Shortness of breath EXAM: PORTABLE CHEST 1 VIEW COMPARISON:  Previous studies including the examination of 03/19/2022 FINDINGS: Cardiac size is  within normal limits. Tip of right PICC line is seen in superior vena cava. There is interval decrease in interstitial markings in parahilar regions and lower lung fields suggesting further clearing of pulmonary edema. No new focal infiltrates are seen. Small bilateral pleural effusions are seen. There is no pneumothorax. Degenerative changes are noted in right shoulder. IMPRESSION: There is decrease in interstitial markings in both lungs suggesting clearing of pulmonary edema. No new focal infiltrates are seen. Small bilateral pleural effusions are seen. Electronically Signed   By: Elmer Picker M.D.   On: 03/20/2022 12:37    Anti-infectives: Anti-infectives (From admission, onward)    Start     Dose/Rate Route Frequency Ordered Stop   03/21/22 1415  metroNIDAZOLE (FLAGYL) IVPB 500 mg        500 mg 100 mL/hr over 60 Minutes Intravenous Every 8 hours 03/21/22 1350     03/21/22 0100  piperacillin-tazobactam (ZOSYN) IVPB 3.375 g        3.375 g 12.5 mL/hr over 240 Minutes Intravenous Every 8 hours 03/21/22 0004     03/16/22 0800  vancomycin (VANCOCIN) capsule 125 mg  Status:  Discontinued       See Hyperspace for full Linked Orders Report.   125 mg Oral Daily with breakfast 03/06/22 0220 03/06/22 1240   03/09/22 2200  vancomycin (VANCOCIN) capsule 125 mg       Note to Pharmacy: 02/27/22 - 03/08/22: Take 1 capsule four times daily; 11/10- 11/16: take 1 capsule two times daily; 11/17 - 11/23: take 1 capsule once daily; from 11/24 and on, see other prescrption sent to [facility]     125 mg Oral 2 times daily 03/09/22 1635 03/14/22 2126   03/09/22 1730  azithromycin (ZITHROMAX) 1,000 mg in dextrose 5 % 250 mL IVPB        1,000 mg 260 mL/hr over 60 Minutes Intravenous Every 24 hours 03/09/22 1635 03/09/22 2024   03/09/22 0800  vancomycin (VANCOCIN) capsule 125 mg  Status:  Discontinued       See Hyperspace for full Linked Orders Report.   125 mg Oral 2 times daily with meals 03/06/22 0220 03/06/22 1240    03/06/22 1800  vancomycin (VANCOCIN) 500 mg in sodium chloride irrigation 0.9 % 100 mL ENEMA  Status:  Discontinued        500 mg Rectal Every 6 hours 03/06/22 1429 03/06/22 1528   03/06/22 1800  vancomycin (VANCOCIN) capsule 125 mg       Note to Pharmacy: 02/27/22 - 03/08/22: Take 1 capsule four times daily; 11/10- 11/16: take 1 capsule two times daily; 11/17 - 11/23: take 1 capsule once daily; from 11/24 and on, see other prescrption [facility]     125 mg Oral Every 6 hours 03/06/22 1528 03/09/22 1300   03/06/22 1300  metroNIDAZOLE (FLAGYL) IVPB 500 mg  Status:  Discontinued  500 mg 100 mL/hr over 60 Minutes Intravenous Every 8 hours 03/06/22 1249 03/08/22 0838   03/06/22 0800  vancomycin (VANCOCIN) capsule 125 mg  Status:  Discontinued       Note to Pharmacy: 02/27/22 - 03/08/22: Take 1 capsule four times daily; 11/10- 11/16: take 1 capsule two times daily; 11/17 - 11/23: take 1 capsule once daily; from 11/24 and on, see other prescrption [facility]     125 mg Oral 3 times daily before meals & bedtime 03/06/22 0216 03/06/22 1240        Assessment/Plan  POD 1 s/p Subtotal colectomy with end ileostomy for Pneumoperitoneum by Dr. Thermon Leyland on 03/20/22 Findings: Massively dilated proximal colon, pneumoperitoneum, no large clear enterotomy identified. Resected from TI to distal descending colon. Has long rectosigmoid stump  - Cont TPN - ok to clamp NGT this AM with ileostomy output, trial sips of clears - if tolerating then ok to remove NGT after 6 hrs and give CLD - Cont abx - WOCN for new ostomy - Surgical path pending  - ok to DC foley from surgical standpoint    FEN - clamping trial with sips of clears, IVF per CCM, TPN VTE - ok for hep gtt ID - Flagyl 11/7 - 11/9. Vanc 11/7 - 11/16. Zosyn 11/22 >> WBC 22 from 45 Foley - ok to DC from surgical standpoint    Dementia PAF on Eliquis at baseline Recurrent C. Diff - C. Diff was negative 11/10. Tx w/ Vanc as above per ID   GI panel + for E. Coli HFpEF    LOS: 16 days     Norm Parcel, [facility] 03/22/2022, 9:22 AM Please see Amion for pager number during day hours 7:00am-4:30pm

## 2022-03-22 NOTE — Progress Notes (Signed)
PHARMACY - TOTAL PARENTERAL NUTRITION CONSULT NOTE  Indication:  prolonged hospitalization with severe malnutrition/anasarca  Patient Measurements: Height: '5\' 2"'$  (157.5 cm) Weight: 43.1 kg (95 lb 0.3 oz) IBW/kg (Calculated) : 50.1 TPN AdjBW (KG): 41.3 Body mass index is 17.38 kg/m. Usual Weight: 49.5 kg  Assessment:  [name] with malnutrition d/t multiple factors including dementia, recurrent C diff with multiple admissions and GI intolerance, and difficulty chewing with ill-fitting dentures. Pt had poor PO intake prior to admit and reports nut allergy and lactose intolerance. Pharmacy consulted for TPN in setting of malnutrition and inability to meet needs with PO intake.    Patient has reported nut allergy (peanuts) - low risk of cross reactivity with SMOFlipid ingredient. Upon asking for rxn/details, pt and pt spouse do not remember pt having a nut allergy.  Have been tolerating SMOFlipid.  Glucose / Insulin: A1c 5.4% - CBGs improving now 75-220 Used 12 units SSI in past 24 hrs S/p methylpred '20mg'$  11/17-11/21 for colitis Electrolytes: Na 143, k 4.1, MG 2.0, others WNL Renal: SCr 0.42, BUN 31 Hepatic: LFTs / TG WNL, albumin 2.8 Intake / Output; MIVF: UOP 2.2 mL/kg/hr, LBM 11/21, Lasix 40 IV daily  GI Imaging: 11/6 CTAP: persistent colonic distention , cannot exclude toxic megacolon 11/9 Ab Xray: decrease in gas distention, improved ileus  11/10 Ab Xray: improvement in gaseous distention 11/12 Ab Xray: no bowel dilation to suggest obstruction/ileus 11/16 Flex sigmoidoscopy: colon bx, mod diverticulosis 1/17 Abd XR: no bowel obstruction GI Surgeries / Procedures:  11/22: Subtotal colectomy with end ileostomy  Central access: PICC placed 03/10/22 TPN start date: 03/10/2022  Nutritional Goals: RD Estimated Needs Total Energy Estimated Needs: 1400-1600 Total Protein Estimated Needs: 70-85g Total Fluid Estimated Needs: 1.4-1.6L  Current Nutrition:  11/11 TPN &  NPO 11/16-11/21 dysphagia 3 diet  11/23 NGT clamping trial w/ possible start of TFs per GI  Plan:  Continue concentrated TPN at goal rate 50 mL/hr to provide 100% of needs (1451 kCal and 70g AA) Electrolytes in TPN: Na 181mq/L, K 325m/L, Ca 73m5mL, Mg 173m27m, Phos 30mm76m, max Cl Add standard MVI and trace elements to TPN Continue moderate SSI Q4H Monitor TPN labs on Mon/Thurs and daily until stable   Thank you for allowing pharmacy to be a part of this patient's care.  AdrieArdyth HarpsrmD Clinical Pharmacist

## 2022-03-23 ENCOUNTER — Inpatient Hospital Stay (HOSPITAL_COMMUNITY): Payer: Medicare Other

## 2022-03-23 DIAGNOSIS — K659 Peritonitis, unspecified: Secondary | ICD-10-CM | POA: Diagnosis not present

## 2022-03-23 DIAGNOSIS — K567 Ileus, unspecified: Secondary | ICD-10-CM | POA: Diagnosis not present

## 2022-03-23 DIAGNOSIS — A0472 Enterocolitis due to Clostridium difficile, not specified as recurrent: Secondary | ICD-10-CM | POA: Diagnosis not present

## 2022-03-23 LAB — COMPREHENSIVE METABOLIC PANEL
ALT: 30 U/L (ref 0–44)
ALT: 32 U/L (ref 0–44)
AST: 21 U/L (ref 15–41)
AST: 19 U/L (ref 15–41)
Albumin: 2.3 g/dL — ABNORMAL LOW (ref 3.5–5.0)
Albumin: 2.4 g/dL — ABNORMAL LOW (ref 3.5–5.0)
Alkaline Phosphatase: 67 U/L (ref 38–126)
Alkaline Phosphatase: 69 U/L (ref 38–126)
Anion gap: 9 (ref 5–15)
Anion gap: 8 (ref 5–15)
BUN: 24 mg/dL — ABNORMAL HIGH (ref 8–23)
BUN: 24 mg/dL — ABNORMAL HIGH (ref 8–23)
CO2: 29 mmol/L (ref 22–32)
CO2: 29 mmol/L (ref 22–32)
Calcium: 8.6 mg/dL — ABNORMAL LOW (ref 8.9–10.3)
Calcium: 8.6 mg/dL — ABNORMAL LOW (ref 8.9–10.3)
Chloride: 109 mmol/L (ref 98–111)
Chloride: 106 mmol/L (ref 98–111)
Creatinine, Ser: 0.43 mg/dL — ABNORMAL LOW (ref 0.44–1.00)
Creatinine, Ser: 0.45 mg/dL (ref 0.44–1.00)
GFR, Estimated: >60 mL/min (ref >60)
GFR, Estimated: >60 mL/min (ref >60)
Glucose, Bld: 540 mg/dL (ref 70–99)
Glucose, Bld: 231 mg/dL — ABNORMAL HIGH (ref 70–99)
Potassium: 3.5 mmol/L (ref 3.5–5.1)
Potassium: 2.3 mmol/L — CL (ref 3.5–5.1)
Sodium: 147 mmol/L — ABNORMAL HIGH (ref 135–145)
Sodium: 143 mmol/L (ref 135–145)
Total Bilirubin: 0.4 mg/dL (ref 0.3–1.2)
Total Bilirubin: 0.3 mg/dL (ref 0.3–1.2)
Total Protein: 4.9 g/dL — ABNORMAL LOW (ref 6.5–8.1)
Total Protein: 5.4 g/dL — ABNORMAL LOW (ref 6.5–8.1)

## 2022-03-23 LAB — CBC
HCT: 28 % — ABNORMAL LOW (ref 36.0–46.0)
Hemoglobin: 9.1 g/dL — ABNORMAL LOW (ref 12.0–15.0)
MCH: 30.8 pg (ref 26.0–34.0)
MCHC: 32.5 g/dL (ref 30.0–36.0)
MCV: 94.9 fL (ref 80.0–100.0)
Platelets: 375 10*3/uL (ref 150–400)
RBC: 2.95 MIL/uL — ABNORMAL LOW (ref 3.87–5.11)
RDW: 18.2 % — ABNORMAL HIGH (ref 11.5–15.5)
WBC: 26.2 10*3/uL — ABNORMAL HIGH (ref 4.0–10.5)
nRBC: 0 % (ref 0.0–0.2)

## 2022-03-23 LAB — MAGNESIUM
Magnesium: 2 mg/dL (ref 1.7–2.4)
Magnesium: 2.5 mg/dL — ABNORMAL HIGH (ref 1.7–2.4)
Magnesium: 1.8 mg/dL (ref 1.7–2.4)

## 2022-03-23 LAB — SURGICAL PATHOLOGY

## 2022-03-23 LAB — BASIC METABOLIC PANEL
Anion gap: 8 (ref 5–15)
BUN: 24 mg/dL — ABNORMAL HIGH (ref 8–23)
CO2: 29 mmol/L (ref 22–32)
Calcium: 8.7 mg/dL — ABNORMAL LOW (ref 8.9–10.3)
Chloride: 108 mmol/L (ref 98–111)
Creatinine, Ser: 0.45 mg/dL (ref 0.44–1.00)
GFR, Estimated: >60 mL/min (ref >60)
Glucose, Bld: 187 mg/dL — ABNORMAL HIGH (ref 70–99)
Potassium: 2.8 mmol/L — ABNORMAL LOW (ref 3.5–5.1)
Sodium: 145 mmol/L (ref 135–145)

## 2022-03-23 LAB — GLUCOSE, CAPILLARY
Glucose-Capillary: 124 mg/dL — ABNORMAL HIGH (ref 70–99)
Glucose-Capillary: 140 mg/dL — ABNORMAL HIGH (ref 70–99)
Glucose-Capillary: 162 mg/dL — ABNORMAL HIGH (ref 70–99)
Glucose-Capillary: 169 mg/dL — ABNORMAL HIGH (ref 70–99)
Glucose-Capillary: 70 mg/dL (ref 70–99)
Glucose-Capillary: 85 mg/dL (ref 70–99)

## 2022-03-23 LAB — PHOSPHORUS
Phosphorus: 5 mg/dL — ABNORMAL HIGH (ref 2.5–4.6)
Phosphorus: 2.2 mg/dL — ABNORMAL LOW (ref 2.5–4.6)

## 2022-03-23 LAB — TROPONIN I (HIGH SENSITIVITY): Troponin I (High Sensitivity): 20 ng/L — ABNORMAL HIGH (ref <18)

## 2022-03-23 MED ORDER — MAGNESIUM SULFATE 2 GM/50ML IV SOLN
2.0000 g | Freq: Once | INTRAVENOUS | Status: AC
  Administered 2022-03-23: 2 g via INTRAVENOUS
  Filled 2022-03-23: qty 50

## 2022-03-23 MED ORDER — METRONIDAZOLE 500 MG PO TABS
500.0000 mg | ORAL_TABLET | Freq: Three times a day (TID) | ORAL | Status: DC
  Administered 2022-03-23 – 2022-03-26 (×9): 500 mg via ORAL
  Filled 2022-03-23 (×11): qty 1

## 2022-03-23 MED ORDER — ORAL CARE MOUTH RINSE: 15.0000 mL | OROMUCOSAL | Status: DC | PRN

## 2022-03-23 MED ORDER — SODIUM CHLORIDE 0.9 % IV SOLN
12.5000 mg | Freq: Four times a day (QID) | INTRAVENOUS | Status: DC | PRN
  Administered 2022-03-23: 12.5 mg via INTRAVENOUS
  Filled 2022-03-23: qty 12.5
  Filled 2022-03-23: qty 0.5

## 2022-03-23 MED ORDER — VANCOMYCIN HCL 125 MG PO CAPS: 125.0000 mg | ORAL_CAPSULE | ORAL | Status: DC

## 2022-03-23 MED ORDER — POTASSIUM CHLORIDE CRYS ER 20 MEQ PO TBCR
40.0000 meq | EXTENDED_RELEASE_TABLET | Freq: Three times a day (TID) | ORAL | Status: AC
  Administered 2022-03-23 – 2022-03-24 (×2): 40 meq via ORAL
  Filled 2022-03-23 (×2): qty 2

## 2022-03-23 MED ORDER — POTASSIUM PHOSPHATES 15 MMOLE/5ML IV SOLN
30.0000 mmol | Freq: Once | INTRAVENOUS | Status: AC
  Administered 2022-03-23: 30 mmol via INTRAVENOUS
  Filled 2022-03-23: qty 10

## 2022-03-23 MED ORDER — POTASSIUM CHLORIDE 10 MEQ/100ML IV SOLN
10.0000 meq | INTRAVENOUS | Status: AC
  Administered 2022-03-23 (×6): 10 meq via INTRAVENOUS
  Filled 2022-03-23 (×6): qty 100

## 2022-03-23 MED ORDER — VANCOMYCIN HCL 125 MG PO CAPS
125.0000 mg | ORAL_CAPSULE | Freq: Four times a day (QID) | ORAL | Status: DC
  Administered 2022-03-23 – 2022-03-28 (×19): 125 mg via ORAL
  Filled 2022-03-23 (×21): qty 1

## 2022-03-23 MED ORDER — POTASSIUM CHLORIDE CRYS ER 20 MEQ PO TBCR
40.0000 meq | EXTENDED_RELEASE_TABLET | Freq: Three times a day (TID) | ORAL | Status: DC
  Administered 2022-03-23: 40 meq via ORAL
  Filled 2022-03-23: qty 2

## 2022-03-23 MED ORDER — DILTIAZEM HCL 30 MG PO TABS
30.0000 mg | ORAL_TABLET | Freq: Once | ORAL | Status: AC | PRN
  Administered 2022-03-23: 30 mg via ORAL
  Filled 2022-03-23: qty 1

## 2022-03-23 MED ORDER — AMIODARONE HCL 200 MG PO TABS
200.0000 mg | ORAL_TABLET | Freq: Two times a day (BID) | ORAL | Status: DC
  Administered 2022-03-23 – 2022-03-27 (×9): 200 mg via ORAL
  Filled 2022-03-23 (×9): qty 1

## 2022-03-23 MED ORDER — TRACE MINERALS CU-MN-SE-ZN 300-55-60-3000 MCG/ML IV SOLN
INTRAVENOUS | Status: AC
  Filled 2022-03-23: qty 464

## 2022-03-23 MED ORDER — VANCOMYCIN HCL 125 MG PO CAPS: 125.0000 mg | ORAL_CAPSULE | Freq: Two times a day (BID) | ORAL | Status: DC

## 2022-03-23 MED ORDER — VANCOMYCIN HCL 125 MG PO CAPS: 125.0000 mg | ORAL_CAPSULE | Freq: Every day | ORAL | Status: DC

## 2022-03-23 MED ORDER — APIXABAN 2.5 MG PO TABS
2.5000 mg | ORAL_TABLET | Freq: Two times a day (BID) | ORAL | Status: DC
  Administered 2022-03-23 – 2022-03-30 (×15): 2.5 mg via ORAL
  Filled 2022-03-23 (×15): qty 1

## 2022-03-23 NOTE — Progress Notes (Signed)
Progress Note  2 Days Post-Op  Subjective: Son at bedside this AM. NGT removed yesterday but patient wasn't given liquids. She is having ileostomy output. Reports some tightness and soreness in abdomen.   Objective: Vital signs in last 24 hours: Temp:  [98.3 F (36.8 C)-98.6 F (37 C)] 98.4 F (36.9 C) (11/24 0812) Pulse Rate:  [80-157] 80 (11/24 0600) Resp:  [14-26] 19 (11/24 0600) BP: (105-171)/(48-126) 126/56 (11/24 0600) SpO2:  [90 %-100 %] 100 % (11/24 0600) Weight:  [43.7 kg] 43.7 kg (11/24 0600) Last BM Date : 03/22/22 (ileostomy output)  Intake/Output from previous day: 11/23 0701 - 11/24 0700 In: 2061.3 [I.V.:1684.4; IV Piggyback:376.9] Out: 5732 [Urine:3409; Stool:210] Intake/Output this shift: Total I/O In: -  Out: 30 [Stool:30]  PE: General: pleasant, WD, frail elderly female who is laying in bed in NAD Heart: regular, rate, and rhythm.   Lungs: CTAB, no wheezes, rhonchi, or rales noted.  Respiratory effort nonlabored Abd: soft, NT, mild distention, +BS, ileostomy viable with small amount liquid dark green output, surgical dressing intact    Lab Results:  Recent Labs    03/22/22 0557 03/23/22 0852  WBC 22.9* 26.2*  HGB 8.8* 9.1*  HCT 27.9* 28.0*  PLT 384 375    BMET Recent Labs    03/22/22 0557 03/22/22 2314  NA 143 145  K 4.1 2.8*  CL 109 108  CO2 27 29  GLUCOSE 176* 187*  BUN 31* 24*  CREATININE 0.42* 0.45  CALCIUM 8.6* 8.7*    PT/INR Recent Labs    03/21/22 0140  LABPROT 14.5  INR 1.1    CMP     Component Value Date/Time   NA 145 03/22/2022 2314   K 2.8 (L) 03/22/2022 2314   CL 108 03/22/2022 2314   CO2 29 03/22/2022 2314   GLUCOSE 187 (H) 03/22/2022 2314   GLUCOSE 99 05/14/2006 0941   BUN 24 (H) 03/22/2022 2314   CREATININE 0.45 03/22/2022 2314   CALCIUM 8.7 (L) 03/22/2022 2314   PROT 5.3 (L) 03/22/2022 0557   ALBUMIN 2.8 (L) 03/22/2022 0557   AST 22 03/22/2022 0557   ALT 41 03/22/2022 0557   ALKPHOS 73  03/22/2022 0557   BILITOT 0.3 03/22/2022 0557   GFRNONAA >60 03/22/2022 2314   GFRAA >60 07/02/2019 1151   Lipase     Component Value Date/Time   LIPASE 45 03/05/2022 1607       Studies/Results: DG CHEST PORT 1 VIEW  Result Date: 03/23/2022 CLINICAL DATA:  Respiratory distress. EXAM: PORTABLE CHEST 1 VIEW COMPARISON:  03/20/2022 and prior studies FINDINGS: Cardiomediastinal silhouette is unchanged. A RIGHT PICC line is identified with tip overlying the LOWER SVC. Increasing pulmonary vascular congestion and mild bilateral interstitial opacities are present. New moderate to large sized RIGHT LOWER lung opacity noted. Small bilateral pleural effusions have slightly increased. There is no evidence of pneumothorax. IMPRESSION: 1. Increasing interstitial pulmonary edema and slightly increased small bilateral pleural effusions. 2. New moderate to large sized RIGHT LOWER lung opacity which may represent airspace disease/pneumonia, atelectasis and/or effusion. Electronically Signed   By: Margarette Canada M.D.   On: 03/23/2022 08:24   DG Abd Portable 1V  Result Date: 03/22/2022 CLINICAL DATA:  Encounter for nasogastric tube placement. EXAM: PORTABLE ABDOMEN - 1 VIEW COMPARISON:  03/20/2022 FINDINGS: Nasogastric tube tip is in the left abdomen and likely within the stomach. Evidence for a PICC line tip in the SVC region but only partially imaged. Limited evaluation of the lung bases.  Skin staples in the abdomen. Small amount of gas within the stomach. IMPRESSION: Nasogastric tube is in the left abdomen and expected region of the stomach. Electronically Signed   By: Markus Daft M.D.   On: 03/22/2022 07:56    Anti-infectives: Anti-infectives (From admission, onward)    Start     Dose/Rate Route Frequency Ordered Stop   05/17/22 1000  vancomycin (VANCOCIN) capsule 125 mg  Status:  Discontinued       See Hyperspace for full Linked Orders Report.   125 mg Oral Every 3 DAYS 03/22/22 0930 03/22/22 1235    04/19/22 1000  vancomycin (VANCOCIN) capsule 125 mg  Status:  Discontinued       See Hyperspace for full Linked Orders Report.   125 mg Oral Every other day 03/22/22 0930 03/22/22 1235   04/12/22 1000  vancomycin (VANCOCIN) capsule 125 mg  Status:  Discontinued       See Hyperspace for full Linked Orders Report.   125 mg Oral Daily 03/22/22 0930 03/22/22 1235   04/05/22 1000  vancomycin (VANCOCIN) capsule 125 mg  Status:  Discontinued       See Hyperspace for full Linked Orders Report.   125 mg Oral 2 times daily 03/22/22 0930 03/22/22 1235   03/22/22 1300  vancomycin (VANCOCIN) capsule 125 mg        125 mg Oral 4 times daily 03/22/22 0930 04/01/22 1359   03/22/22 1030  vancomycin (VANCOCIN) capsule 125 mg  Status:  Discontinued       See Hyperspace for full Linked Orders Report.   125 mg Oral 4 times daily 03/22/22 0930 03/22/22 1235   03/21/22 1415  metroNIDAZOLE (FLAGYL) IVPB 500 mg        500 mg 100 mL/hr over 60 Minutes Intravenous Every 8 hours 03/21/22 1350     03/21/22 0100  piperacillin-tazobactam (ZOSYN) IVPB 3.375 g        3.375 g 12.5 mL/hr over 240 Minutes Intravenous Every 8 hours 03/21/22 0004 03/24/22 2159   03/16/22 0800  vancomycin (VANCOCIN) capsule 125 mg  Status:  Discontinued       See Hyperspace for full Linked Orders Report.   125 mg Oral Daily with breakfast 03/06/22 0220 03/06/22 1240   03/09/22 2200  vancomycin (VANCOCIN) capsule 125 mg       Note to Pharmacy: 02/27/22 - 03/08/22: Take 1 capsule four times daily; 11/10- 11/16: take 1 capsule two times daily; 11/17 - 11/23: take 1 capsule once daily; from 11/24 and on, see other [facility]     125 mg Oral 2 times daily 03/09/22 1635 03/14/22 2126   03/09/22 1730  azithromycin (ZITHROMAX) 1,000 mg in dextrose 5 % 250 mL IVPB        1,000 mg 260 mL/hr over 60 Minutes Intravenous Every 24 hours 03/09/22 1635 03/09/22 2024   03/09/22 0800  vancomycin (VANCOCIN) capsule 125 mg  Status:  Discontinued        See Hyperspace for full Linked Orders Report.   125 mg Oral 2 times daily with meals 03/06/22 0220 03/06/22 1240   03/06/22 1800  vancomycin (VANCOCIN) 500 mg in sodium chloride irrigation 0.9 % 100 mL ENEMA  Status:  Discontinued        500 mg Rectal Every 6 hours 03/06/22 1429 03/06/22 1528   03/06/22 1800  vancomycin (VANCOCIN) capsule 125 mg       Note to Pharmacy: 02/27/22 - 03/08/22: Take 1 capsule four times daily;  11/10- 11/16: take 1 capsule two times daily; 11/17 - 11/23: take 1 capsule once daily; from 11/24 and on, see other prescrption [facility]     125 mg Oral Every 6 hours 03/06/22 1528 03/09/22 1300   03/06/22 1300  metroNIDAZOLE (FLAGYL) IVPB 500 mg  Status:  Discontinued        500 mg 100 mL/hr over 60 Minutes Intravenous Every 8 hours 03/06/22 1249 03/08/22 0838   03/06/22 0800  vancomycin (VANCOCIN) capsule 125 mg  Status:  Discontinued       Note to Pharmacy: 02/27/22 - 03/08/22: Take 1 capsule four times daily; 11/10- 11/16: take 1 capsule two times daily; 11/17 - 11/23: take 1 capsule once daily; from 11/24 and on, see other prescrption [facility]     125 mg Oral 3 times daily before meals & bedtime 03/06/22 0216 03/06/22 1240        Assessment/Plan  POD 2 s/p Subtotal colectomy with end ileostomy for Pneumoperitoneum by Dr. Thermon Leyland on 03/20/22 Findings: Massively dilated proximal colon, pneumoperitoneum, no large clear enterotomy identified. Resected from TI to distal descending colon. Has long rectosigmoid stump  - Cont TPN - will wean tomorrow if tolerating FLD - ok to have FLD today - Cont abx - WOCN for new ostomy - Surgical path pending  - ok to DC foley from surgical standpoint    FEN - FLD, TPN VTE - ok for hep gtt ID - Flagyl 11/7 - 11/9. Vanc 11/7 - 11/16. Zosyn 11/22 >> WBC 22 from 45 Foley - ok to DC from surgical standpoint    Dementia PAF on Eliquis at baseline Recurrent C. Diff - C. Diff was negative 11/10. Tx w/ Vanc as above per ID   GI panel + for E. Coli HFpEF    LOS: 39 days     Norm Parcel, [facility] 03/23/2022, 10:25 AM Please see Amion for pager number during day hours 7:00am-4:30pm

## 2022-03-23 NOTE — Progress Notes (Signed)
Occupational Therapy Treatment Patient Details Name: Julie Montgomery MRN: [id number] DOB: 1935-08-23 Today's Date: 03/23/2022   History of present illness Pt is an 86 y.o. female admitted 03/05/22 with BLE edema, ABd distension, poor PO intake. Pt with recurrent C-diff, colonic ileus, dehydration. Sigmoidoscopy 11/16 with findings of erythmatous and congested mucosa in sigmoid colon, diverticulosis. Afib with RVR 11/18. 11/22 Subtotal colectomy with end ileostomy for Pneumoperitoneum.  Of note, multiple recent admissions the past 3 months with c-diff. PMH includes CHF, PAF, anemia, osteopenia, TIA, chronic hyponatremia.   OT comments  This 86 yo female admitted for above presents to acute OT with being setup/S for self feeding and grooming seated in recliner. Min A +2 for safety (lines/tubes/follow with recliner/control liquid poop in standng) for tranfers with RW. She will continue to benefit from acute OT with follow up HHOT and 24 hour S/prn A.   Recommendations for follow up therapy are one component of a multi-disciplinary discharge planning process, led by the attending physician.  Recommendations may be updated based on patient status, additional functional criteria and insurance authorization.    Follow Up Recommendations  Home health OT     Assistance Recommended at Discharge Frequent or constant Supervision/Assistance  Patient can return home with the following  A lot of help with walking and/or transfers;A lot of help with bathing/dressing/bathroom;Assistance with cooking/housework;Direct supervision/assist for medications management;Direct supervision/assist for financial management;Assist for transportation;Help with stairs or ramp for entrance   Equipment Recommendations  None recommended by OT       Precautions / Restrictions Precautions Precautions: Fall;Other (comment) Precaution Comments: diarrhea, ostomy, bil foot wounds Restrictions Weight Bearing Restrictions: No        Mobility Bed Mobility Overal bed mobility: Needs Assistance Bed Mobility: Supine to Sit           General bed mobility comments: pt sleeps in lift chair at baseline and transitioned pt from supine to sitting via foot egress bed with min guard and cues to scoot to EOB with rails    Transfers Overall transfer level: Needs assistance   Transfers: Sit to/from Stand Sit to Stand: Min assist, +2 safety/equipment           General transfer comment: min assist to stand from chair position bed (x 3 trials) and from recliner (x 2 trials), mod cues for hand placement with pt standing from bed and sitting on bed pain due to incontinent diarrhea with standing     Balance Overall balance assessment: Needs assistance Sitting-balance support: No upper extremity supported, Feet supported, Bilateral upper extremity supported Sitting balance-Leahy Scale: Fair Sitting balance - Comments: fair sitting balance with and without UE support   Standing balance support: Bilateral upper extremity supported Standing balance-Leahy Scale: Poor Standing balance comment: reliant on RW and physical assist for balance                           ADL either performed or assessed with clinical judgement   ADL Overall ADL's : Needs assistance/impaired Eating/Feeding: Set up;Sitting   Grooming: Set up;Supervision/safety;Wash/dry face;Sitting                   Toilet Transfer: Minimal assistance;+2 for safety/equipment;Ambulation;Rolling walker (2 wheels)   Toileting- Clothing Manipulation and Hygiene: Total assistance Toileting - Clothing Manipulation Details (indicate cue type and reason): min A sit<>stand            Extremity/Trunk Assessment Upper Extremity Assessment Upper Extremity Assessment:  Generalized weakness            Vision Baseline Vision/History: 1 Wears glasses Patient Visual Report: No change from baseline            Cognition Arousal/Alertness:  Awake/alert Behavior During Therapy: Anxious Overall Cognitive Status: History of cognitive impairments - at baseline Area of Impairment: Memory, Problem solving, Awareness, Following commands, Safety/judgement, Attention, Orientation                 Orientation Level: Disoriented to, Time Current Attention Level: Sustained Memory: Decreased short-term memory Following Commands: Follows one step commands with increased time, Follows one step commands inconsistently Safety/Judgement: Decreased awareness of deficits, Decreased awareness of safety   Problem Solving: Slow processing, Difficulty sequencing, Requires verbal cues, Decreased initiation General Comments: cognitive impairments at baseline, pt with STM challenges, decreased awareness of position in space              General Comments 91-98% on RA  HR 88-96  133/62    Pertinent Vitals/ Pain       Pain Assessment Pain Assessment: No/denies pain         Frequency  Min 2X/week        Progress Toward Goals  OT Goals(current goals can now be found in the care plan section)  Progress towards OT goals: Progressing toward goals (slowly due to recent move to ICU)  Acute Rehab OT Goals Patient Stated Goal: to sit straight up in recliner and bed (what she does at home in lift chair) OT Goal Formulation: With patient Time For Goal Achievement: 04/06/22 Potential to Achieve Goals: Good  Plan Discharge plan remains appropriate    Co-evaluation    PT/OT/SLP Co-Evaluation/Treatment: Yes Reason for Co-Treatment: For patient/therapist safety;To address functional/ADL transfers PT goals addressed during session: Mobility/safety with mobility OT goals addressed during session: ADL's and self-care;Strengthening/ROM      AM-PAC OT "6 Clicks" Daily Activity     Outcome Measure   Help from another person eating meals?: A Little Help from another person taking care of personal grooming?: A Little Help from another  person toileting, which includes using toliet, bedpan, or urinal?: A Lot Help from another person bathing (including washing, rinsing, drying)?: A Lot Help from another person to put on and taking off regular upper body clothing?: A Lot Help from another person to put on and taking off regular lower body clothing?: A Lot 6 Click Score: 14    End of Session Equipment Utilized During Treatment: Gait belt;Rolling walker (2 wheels)  OT Visit Diagnosis: Unsteadiness on feet (R26.81);Other abnormalities of gait and mobility (R26.89);Muscle weakness (generalized) (M62.81);Other symptoms and signs involving cognitive function;Pain   Activity Tolerance Patient tolerated treatment well   Patient Left in chair;with family/visitor present   Nurse Communication Mobility status (oozing stool out of rectum (not stool in ostomy))        Time: 0921-0958 OT Time Calculation (min): 37 min  Charges: OT General Charges $OT Visit: 1 Visit OT Treatments $Self Care/Home Management : 8-22 mins Golden Circle, OTR/L Acute Rehab Services [facility] [phone] Office [phone]    Almon Register 03/23/2022, 10:52 AM

## 2022-03-23 NOTE — Consult Note (Addendum)
[place] Nurse ostomy consult note Pt is familiar to [facility] team from previous consults for sacrum wound; refer to previous progress notes, most recently on 11/21.    Consult requested today for new ileostomy; surgery was performed on 11/22. Stoma type/location: Stoma is red and viable, above skin level, 1 1/4 inches Peristomal assessment: intact skin surrounding Output: 30cc liquid brown stool Ostomy pouching: 2pc.  Education provided:  Pt is in ICU and not ready for teaching.  Applied barrier ring and two piece pouching system Enrolled patient in [place] program: NOT YET [place] team will begin teaching sessions when Pt is stable and out of ICU. 5 sets of supplies ordered to the bedside for staff nurse's use. Use Supplies: barrier ring, Kellie Simmering # [phone], wafer Kellie Simmering # [address]  [address] MSN, [place], [place], [place], [place]

## 2022-03-23 NOTE — Progress Notes (Signed)
PROGRESS NOTE    Julie Montgomery  QJJ:[id number] DOB: [birth year] DOA: 03/05/2022 PCP: Cassandria Anger, MD    Brief Narrative:  86 year old with history of dementia, recurrent C. difficile infection, permanent A-fib on Eliquis, chronic hyponatremia who was admitted with several days of poor oral intake, diarrhea. 11/6 admitted for FTT - BMI 17 11/9 started TPN 11/16 flex sig> congested mucosa, diverticula. Path not consistent with microscopic colitis or IBD.   11/22 perforated> colon resection with diverting ileostomy -> In ICU post-op intubated, requiring sedation due to agitation. ID supect perf abd from toxic megacolon. Rx resume oral vanc and IV flagyl and post op zosyn  Extubated in PM   Transferred to medical floor 11/23. Marland Kitchen  A-fib with RVR and currently on amiodarone infusion along with heparin.  Assessment & Plan:   Acute intestinal perforation secondary to C diff of induced megacolon: 11/22: Colectomy and diverting ileostomy.  Surgically improving.  On full liquid diet today. Followed by ID, remains on IV Flagyl and oral vancomycin.  Prolonged therapy will be needed. Mobilize.  Advance diet.  Patient continued to be on Zosyn due to contaminated abdomen is recommended until 11/25. Remains on TPN to augment nutrition.  Paroxysmal A-fib with RVR: Recurrent pauses. Rate controlled in sinus rhythm today.  Currently on amiodarone infusion. Converted to amiodarone loading dose 200 mg twice daily, will continue to hold Cardizem due to evidence of pauses.  Seen by EP cardiology.  We will discuss about rate control regimen when she is near to discharge. No further surgical intervention is anticipated, discontinue heparin and start on Eliquis.  Severe protein calorie malnutrition: TPN at goal.  Advancing diet.  Electrolytes including Hypokalemia/hypomagnesemia/hypophosphatemia: Stable.  Recheck tomorrow.  ICU acquired delirium: Stabilizing.     DVT prophylaxis: apixaban  (ELIQUIS) tablet 2.5 mg Start: 03/23/22 1130 SCDs Start: 03/05/22 2227 apixaban (ELIQUIS) tablet 2.5 mg   Code Status: Full code Family Communication: Husband and son at the bedside Disposition Plan: Status is: Inpatient Remains inpatient appropriate because: Severe critical illness     Consultants:  Critical care General surgery Gastroenterology Infectious disease  Procedures:  Colectomy and ileostomy  Antimicrobials:  Vancomycin and Flagyl Zosyn 11/22---   Subjective: Patient was seen and examined.  Her husband and son were at the bedside.  Patient was sitting in chair.  Patient tells me that "I want nobody to question my mental status".  Mild soreness remains. Telemetry with sinus rhythm with heart rate 70-80. She was doing well on full liquid diet. Wants to go home.  Objective: Vitals:   03/23/22 0500 03/23/22 0600 03/23/22 0812 03/23/22 1029  BP: 121/60 (!) 126/56    Pulse: 89 80    Resp: (!) 25 19    Temp:   98.4 F (36.9 C)   TempSrc:   Oral   SpO2: 100% 100%  91%  Weight:  43.7 kg    Height:        Intake/Output Summary (Last 24 hours) at 03/23/2022 1140 Last data filed at 03/23/2022 0900 Gross per 24 hour  Intake 1734.87 ml  Output 3399 ml  Net -1664.13 ml   Filed Weights   03/21/22 0500 03/22/22 0500 03/23/22 0600  Weight: 42.3 kg 43.1 kg 43.7 kg    Examination:  General exam: Appears calm and comfortable  Cachectic.  Thin and frail.  Debilitated. Respiratory system: Clear to auscultation. Respiratory effort normal. Cardiovascular system: S1 & S2 heard, RRR.  No pedal edema. Gastrointestinal system: Mild tenderness without rigidity  or guarding.  Ileostomy with loose stool. Central nervous system: Alert and oriented x2-3.Marland Kitchen No focal neurological deficits.    Data Reviewed: I have personally reviewed following labs and imaging studies  CBC: Recent Labs  Lab 03/18/22 0250 03/19/22 0350 03/20/22 0508 03/21/22 0635 03/21/22 0737  03/22/22 0557 03/23/22 0852  WBC 16.7* 14.6* 15.5*  --  41.5* 22.9* 26.2*  NEUTROABS 14.3* 12.1* 12.8*  --  38.6* 20.9*  --   HGB 9.1* 9.2* 9.6* 9.9* 9.8* 8.8* 9.1*  HCT 27.0* 28.1* 29.5* 29.0* 30.2* 27.9* 28.0*  MCV 91.2 93.0 93.4  --  94.4 96.5 94.9  PLT 334 397 465*  --  519* 384 426   Basic Metabolic Panel: Recent Labs  Lab 03/19/22 0350 03/20/22 0508 03/21/22 0737 03/21/22 2155 03/22/22 0557 03/22/22 2314 03/23/22 0852  NA 138   < > 138 142 143 145 147*  K 4.3   < > 4.7 4.5 4.1 2.8* 3.5  CL 101   < > 104 108 109 108 109  CO2 30   < > '26 28 27 29 29  '$ GLUCOSE 119*   < > 177* 220* 176* 187* 540*  BUN 39*   < > 48* 35* 31* 24* 24*  CREATININE 0.47   < > 0.57 0.53 0.42* 0.45 0.43*  CALCIUM 9.1   < > 9.4 8.8* 8.6* 8.7* 8.6*  MG 2.1  --  2.1 2.1 2.0 2.0 2.5*  PHOS 4.0  --  4.0  --  2.8  --  5.0*   < > = values in this interval not displayed.   GFR: Estimated Creatinine Clearance: 36.1 mL/min (A) (by C-G formula based on SCr of 0.43 mg/dL (L)). Liver Function Tests: Recent Labs  Lab 03/19/22 0350 03/21/22 0737 03/22/22 0557 03/23/22 0852  AST 38 37 22 21  ALT 50* 62* 41 30  ALKPHOS 91 77 73 67  BILITOT 0.3 0.5 0.3 0.4  PROT 5.7* 5.7* 5.3* 4.9*  ALBUMIN 2.8* 3.3* 2.8* 2.3*   No results for input(s): "LIPASE", "AMYLASE" in the last 168 hours. No results for input(s): "AMMONIA" in the last 168 hours. Coagulation Profile: Recent Labs  Lab 03/21/22 0140  INR 1.1   Cardiac Enzymes: No results for input(s): "CKTOTAL", "CKMB", "CKMBINDEX", "TROPONINI" in the last 168 hours. BNP (last 3 results) Recent Labs    01/26/22 1447  PROBNP 68.0   HbA1C: No results for input(s): "HGBA1C" in the last 72 hours. CBG: Recent Labs  Lab 03/22/22 1544 03/22/22 1950 03/22/22 2326 03/23/22 0336 03/23/22 0807  GLUCAP 132* 109* 181* 124* 140*   Lipid Profile: Recent Labs    03/22/22 0557  TRIG 32   Thyroid Function Tests: No results for input(s): "TSH", "T4TOTAL",  "FREET4", "T3FREE", "THYROIDAB" in the last 72 hours. Anemia Panel: No results for input(s): "VITAMINB12", "FOLATE", "FERRITIN", "TIBC", "IRON", "RETICCTPCT" in the last 72 hours. Sepsis Labs: No results for input(s): "PROCALCITON", "LATICACIDVEN" in the last 168 hours.  No results found for this or any previous visit (from the past 240 hour(s)).       Radiology Studies: DG CHEST PORT 1 VIEW  Result Date: 03/23/2022 CLINICAL DATA:  Respiratory distress. EXAM: PORTABLE CHEST 1 VIEW COMPARISON:  03/20/2022 and prior studies FINDINGS: Cardiomediastinal silhouette is unchanged. A RIGHT PICC line is identified with tip overlying the LOWER SVC. Increasing pulmonary vascular congestion and mild bilateral interstitial opacities are present. New moderate to large sized RIGHT LOWER lung opacity noted. Small bilateral pleural effusions have slightly increased.  There is no evidence of pneumothorax. IMPRESSION: 1. Increasing interstitial pulmonary edema and slightly increased small bilateral pleural effusions. 2. New moderate to large sized RIGHT LOWER lung opacity which may represent airspace disease/pneumonia, atelectasis and/or effusion. Electronically Signed   By: Margarette Canada M.D.   On: 03/23/2022 08:24   DG Abd Portable 1V  Result Date: 03/22/2022 CLINICAL DATA:  Encounter for nasogastric tube placement. EXAM: PORTABLE ABDOMEN - 1 VIEW COMPARISON:  03/20/2022 FINDINGS: Nasogastric tube tip is in the left abdomen and likely within the stomach. Evidence for a PICC line tip in the SVC region but only partially imaged. Limited evaluation of the lung bases. Skin staples in the abdomen. Small amount of gas within the stomach. IMPRESSION: Nasogastric tube is in the left abdomen and expected region of the stomach. Electronically Signed   By: Markus Daft M.D.   On: 03/22/2022 07:56        Scheduled Meds:  amiodarone  200 mg Oral BID   apixaban  2.5 mg Oral BID   Chlorhexidine Gluconate Cloth  6 each  Topical Daily   furosemide  40 mg Intravenous Daily   Gerhardt's butt cream   Topical BID   insulin aspart  0-15 Units Subcutaneous Q4H   leptospermum manuka honey  1 Application Topical Daily   lidocaine  1 patch Transdermal Q24H   memantine  5 mg Oral BID   sodium chloride flush  10-40 mL Intracatheter Q12H   vancomycin  125 mg Oral QID   Vitamin D (Ergocalciferol)  50,000 Units Oral Q7 days   Continuous Infusions:  metronidazole 100 mL/hr at 03/23/22 0600   piperacillin-tazobactam (ZOSYN)  IV 12.5 mL/hr at 03/23/22 0600   TPN ADULT (ION) 50 mL/hr at 03/23/22 0600   TPN ADULT (ION)       LOS: 17 days    Time spent: 35 minutes    Barb Merino, MD [facility] Pager [phone]

## 2022-03-23 NOTE — Progress Notes (Addendum)
PHARMACY - TOTAL PARENTERAL NUTRITION CONSULT NOTE  Indication:  prolonged hospitalization with severe malnutrition/anasarca  Patient Measurements: Height: '5\' 2"'$  (157.5 cm) Weight: 43.7 kg (96 lb 5.5 oz) IBW/kg (Calculated) : 50.1 TPN AdjBW (KG): 41.3 Body mass index is 17.62 kg/m. Usual Weight: 49.5 kg  Assessment:  [name] with malnutrition d/t multiple factors including dementia, recurrent C diff with multiple admissions and GI intolerance, and difficulty chewing with ill-fitting dentures. Pt had poor PO intake prior to admit and reports nut allergy and lactose intolerance. Pharmacy consulted for TPN in setting of malnutrition and inability to meet needs with PO intake.    Patient has reported nut allergy (peanuts) - low risk of cross reactivity with SMOFlipid ingredient. Upon asking for rxn/details, pt and pt spouse do not remember pt having a nut allergy.  Have been tolerating SMOFlipid.  Glucose / Insulin: A1c 5.4% - CBGs mostly controlled <180 (range 109-187) Used 11 units SSI in past 24 hrs S/p methylpred '20mg'$  11/17-11/21 for colitis Electrolytes: last labs 11/23 (labs 11/24 contaminated): K 4.1>2.8 (?accuracy, no repletion ordered 11/23), others stable WNL Goal K>/=4 and Mag >/=2 with hx afib Renal: SCr stable <1, BUN down to 24 Hepatic: LFTs / Tbili / TG WNL, albumin 2.8 Intake / Output; MIVF: UOP 3.3 mL/kg/hr, ileostomy output 250 ml, LBM 11/23, Lasix 40 IV daily (dose reduced 11/20)  GI Imaging: 11/6 CTAP: persistent colonic distention , cannot exclude toxic megacolon 11/9 Ab Xray: decrease in gas distention, improved ileus  11/10 Ab Xray: improvement in gaseous distention 11/12 Ab Xray: no bowel dilation to suggest obstruction/ileus 11/16 Flex sigmoidoscopy: colon bx, mod diverticulosis 1/17 Abd XR: no bowel obstruction GI Surgeries / Procedures:  11/22: Subtotal colectomy with end ileostomy  Central access: PICC placed 03/10/22 TPN start date:  03/10/2022  Nutritional Goals: RD Estimated Needs Total Energy Estimated Needs: 1400-1600 Total Protein Estimated Needs: 70-85g Total Fluid Estimated Needs: 1.4-1.6L  Current Nutrition:  11/11 TPN & NPO 11/16-11/21 dysphagia 3 diet 11/23 sips of clears (tolerated) >> CLD 11/24 advanced to FLD  Plan:  Continue concentrated TPN at goal rate 50 mL/hr to provide 100% of needs (1451 kCal and 70g AA) Per Surgery note, if tolerates FLD today, will plan to wean TPN 11/25 Electrolytes in TPN: Na 168mq/L, K 348m/L, Ca 35m72mL, Mg 135m50m, Phos 30mm535m, max Cl Add standard MVI and trace elements to TPN Continue moderate SSI Q4H. Adjust as needed Monitor TPN labs daily until stable, then standard on Mon/Thurs  Labs 11/24 resulted as contaminated and unable to use. Will be unable to result new labs prior to TPN order entry deadline today, but will f/u on redrawn labs this afternoon and replace outside the TPN bag as needed.   HaleyArturo MortonrmD, BCPS Please check AMION for all [facility] numbers Clinical Pharmacist 03/23/2022 8:33 AM  ADDENDUM - stat redraw labs resulted this afternoon. K low 2.3, Phos low 2.2, mag low-normal 1.8.  Plan: MD already ordered: KPhos 30mmo62m x 1 KCl 40mEq 5m 2 doses + Kcl 10mEq I67m6 doses  Will give additional mag sulfate 2g IV x 1 Recheck electrolytes in AM   Shantai Tiedeman voArturo Morton, BCPS Please check AMION for all [facility] numbers Clinical Pharmacist 03/23/2022 3:16 PM

## 2022-03-23 NOTE — Progress Notes (Signed)
Patient arrived to 3W10 in severe pain and nauseated. Pain meds given and new order for Phenergan received. Husband at bedside and brought dentures, placed in pink cup at sink. Patient has glasses on, no other belongings present. VS stable.

## 2022-03-23 NOTE — Progress Notes (Signed)
[facility] Progress Note Patient Name: Julie Montgomery DOB: 07-03-1935 MRN: [id number]   Date of Service  03/23/2022  HPI/Events of Note  Still in RVR rate 120-150. Received the cardizem 30 mg at 11 PM  BP 141/71  HR 128 Seen awake and asymptomatic  eICU Interventions  Ordered another dose of PO cardizem 30 mg x 1 now     Intervention Category Intermediate Interventions: Arrhythmia - evaluation and management  Raquel Sarna T Chela Sutphen 03/23/2022, 1:15 AM

## 2022-03-23 NOTE — Progress Notes (Signed)
Physical Therapy Treatment Patient Details Name: Julie Montgomery MRN: [id number] DOB: 1936/02/09 Today's Date: 03/23/2022   History of Present Illness Pt is an 86 y.o. female admitted 03/05/22 with BLE edema, ABd distension, poor PO intake. Pt with recurrent C-diff, colonic ileus, dehydration. Sigmoidoscopy 11/16 with findings of erythmatous and congested mucosa in sigmoid colon, diverticulosis. Afib with RVR 11/18. 11/22 Subtotal colectomy with end ileostomy for Pneumoperitoneum.  Of note, multiple recent admissions the past 3 months with c-diff. PMH includes CHF, PAF, anemia, osteopenia, TIA, chronic hyponatremia.    PT Comments    Pt pleasant, anxious with encouragement and reassurance to mobilize with spouse and son present during session. Pt with incontinent diarrhea with standing requiring total assist for pericare, linen change and increased standing trials for toileting. Pt with limited progression in gait walking 5' forward and back x 2 trials with mod cue and encouragement. Will continue to follow to maximize mobility and function with encouragement for OOB during day and seated bil LE exercises.    91-98% on RA HR 88-96 133/62   Recommendations for follow up therapy are one component of a multi-disciplinary discharge planning process, led by the attending physician.  Recommendations may be updated based on patient status, additional functional criteria and insurance authorization.  Follow Up Recommendations  Home health PT     Assistance Recommended at Discharge Frequent or constant Supervision/Assistance  Patient can return home with the following A little help with bathing/dressing/bathroom;Assistance with cooking/housework;Direct supervision/assist for medications management;Direct supervision/assist for financial management;Assist for transportation;Help with stairs or ramp for entrance;Assistance with feeding;A lot of help with walking and/or transfers   Equipment  Recommendations  None recommended by PT    Recommendations for Other Services       Precautions / Restrictions Precautions Precautions: Fall;Other (comment) Precaution Comments: diarrhea, ostomy, bil foot wounds     Mobility  Bed Mobility Overal bed mobility: Needs Assistance Bed Mobility: Supine to Sit           General bed mobility comments: pt sleeps in lift chair at baseline and transitioned pt from supine to sitting via foot egress bed with min guard and cues to scoot to EOB with rails    Transfers Overall transfer level: Needs assistance   Transfers: Sit to/from Stand Sit to Stand: Min assist, +2 safety/equipment           General transfer comment: min assist to stand from chair position bed (x 3 trials) and from recliner (x 2 trials), mod cues for hand placement with pt standing from bed and sitting on bed pain due to incontinent diarrhea with standing    Ambulation/Gait Ambulation/Gait assistance: Min assist, +2 safety/equipment Gait Distance (Feet): 10 Feet Assistive device: Rolling walker (2 wheels) Gait Pattern/deviations: Step-through pattern, Decreased stride length, Trunk flexed, Narrow base of support   Gait velocity interpretation: <1.8 ft/sec, indicate of risk for recurrent falls   General Gait Details: pt able to walk 5' forward and back with close chair follow, physical assist to control and direct RW x 2 trials   Stairs             Wheelchair Mobility    Modified Rankin (Stroke Patients Only)       Balance Overall balance assessment: Needs assistance   Sitting balance-Leahy Scale: Fair Sitting balance - Comments: fair sitting balance with and without UE support   Standing balance support: Bilateral upper extremity supported Standing balance-Leahy Scale: Poor Standing balance comment: reliant on RW and physical assist  for balance                            Cognition Arousal/Alertness: Awake/alert Behavior During  Therapy: Anxious Overall Cognitive Status: History of cognitive impairments - at baseline Area of Impairment: Memory, Problem solving, Awareness, Following commands, Safety/judgement, Attention, Orientation                 Orientation Level: Disoriented to, Time Current Attention Level: Sustained Memory: Decreased short-term memory Following Commands: Follows one step commands with increased time, Follows one step commands inconsistently Safety/Judgement: Decreased awareness of deficits, Decreased awareness of safety   Problem Solving: Slow processing, Difficulty sequencing, Requires verbal cues, Decreased initiation General Comments: cognitive impairments at baseline, pt with STM challenges, decreased awareness of position in space        Exercises      General Comments        Pertinent Vitals/Pain Pain Assessment Pain Assessment: No/denies pain    Home Living                          Prior Function            PT Goals (current goals can now be found in the care plan section) Acute Rehab PT Goals PT Goal Formulation: With patient/family Time For Goal Achievement: 04/06/22 Potential to Achieve Goals: Fair Progress towards PT goals: Progressing toward goals    Frequency    Min 3X/week      PT Plan Current plan remains appropriate    Co-evaluation PT/OT/SLP Co-Evaluation/Treatment: Yes            AM-PAC PT "6 Clicks" Mobility   Outcome Measure  Help needed turning from your back to your side while in a flat bed without using bedrails?: A Little Help needed moving from lying on your back to sitting on the side of a flat bed without using bedrails?: A Lot Help needed moving to and from a bed to a chair (including a wheelchair)?: A Lot Help needed standing up from a chair using your arms (e.g., wheelchair or bedside chair)?: A Little Help needed to walk in hospital room?: Total Help needed climbing 3-5 steps with a railing? : Total 6  Click Score: 12    End of Session Equipment Utilized During Treatment: Gait belt Activity Tolerance: Patient tolerated treatment well Patient left: in chair;with call bell/phone within reach;with family/visitor present Nurse Communication: Mobility status PT Visit Diagnosis: Unsteadiness on feet (R26.81);Muscle weakness (generalized) (M62.81);Difficulty in walking, not elsewhere classified (R26.2);Pain     Time: 0921-0958 PT Time Calculation (min) (ACUTE ONLY): 37 min  Charges:  $Gait Training: 8-22 mins                     Bayard Males, PT Acute Rehabilitation Services Office: [place] 03/23/2022, 10:39 AM

## 2022-03-23 NOTE — Progress Notes (Signed)
Patient transferred to 3 [facility] room 10, without any complications, all belongings place at bedside.

## 2022-03-23 NOTE — Progress Notes (Signed)
[name]    Date of Admission:  03/05/2022     ID: Julie Montgomery is a 86 y.o. female with  recurrent cdifficile colitis with ileus, pneumoperitonea requiring emergent subtotal colectomy with end ileostomy    Principal Problem:   Ileus ([place]) Active Problems:   Goals of care, counseling/discussion   Dehydration   Protein calorie malnutrition (HCC)   Paroxysmal atrial fibrillation (HCC)   Recurrent Clostridioides difficile infection   C. difficile diarrhea   Recurrent colitis due to Clostridium difficile   Anemia of chronic disease   Protein-calorie malnutrition, severe   Abnormal x-ray of abdomen   Adynamic ileus ([place])   Peritonitis ([place])   Ngt off Transferred service from icu to ward last night Remains on tpn Reviewed surgery assessment -- ileostomy productive and ileus resolving. Diet is being advanced  Wbc down  Subjective: Remains intubated  Medications:   amiodarone  200 mg Oral BID   apixaban  2.5 mg Oral BID   Chlorhexidine Gluconate Cloth  6 each Topical Daily   furosemide  40 mg Intravenous Daily   Gerhardt's butt cream   Topical BID   insulin aspart  0-15 Units Subcutaneous Q4H   leptospermum manuka honey  1 Application Topical Daily   lidocaine  1 patch Transdermal Q24H   memantine  5 mg Oral BID   potassium chloride  40 mEq Oral TID   sodium chloride flush  10-40 mL Intracatheter Q12H   vancomycin  125 mg Oral QID   Vitamin D (Ergocalciferol)  50,000 Units Oral Q7 days    Objective: Vital signs in last 24 hours: Temp:  [98.1 F (36.7 C)-98.6 F (37 C)] 98.1 F (36.7 C) (11/24 1145) Pulse Rate:  [74-157] 81 (11/24 1200) Resp:  [16-31] 31 (11/24 1200) BP: (105-148)/(49-126) 119/58 (11/24 1200) SpO2:  [90 %-100 %] 98 % (11/24 1200) Weight:  [43.7 kg] 43.7 kg (11/24 0600) Physical Exam  General/constitutional:thin, conversant; no distress HEENT: Normocephalic CV: rrr no mrg Lungs: normal respiratory  effort Abd: Soft; ileostomy with small greenish stool and air; dressing c/d Ext: no edema Neuro: nonfocal      Lab Results Recent Labs    03/22/22 0557 03/22/22 2314 03/23/22 0852 03/23/22 1112  WBC 22.9*  --  26.2*  --   HGB 8.8*  --  9.1*  --   HCT 27.9*  --  28.0*  --   NA 143   < > 147* 143  K 4.1   < > 3.5 2.3*  CL 109   < > 109 106  CO2 27   < > 29 29  BUN 31*   < > 24* 24*  CREATININE 0.42*   < > 0.43* 0.45   < > = values in this interval not displayed.   Liver Panel Recent Labs    03/23/22 0852 03/23/22 1112  PROT 4.9* 5.4*  ALBUMIN 2.3* 2.4*  AST 21 19  ALT 30 32  ALKPHOS 67 69  BILITOT 0.4 0.3    Microbiology: reviewed Studies/Results: DG CHEST PORT 1 VIEW  Result Date: 03/23/2022 CLINICAL DATA:  Respiratory distress. EXAM: PORTABLE CHEST 1 VIEW COMPARISON:  03/20/2022 and prior studies FINDINGS: Cardiomediastinal silhouette is unchanged. A RIGHT PICC line is identified with tip overlying the LOWER SVC. Increasing pulmonary vascular congestion and mild bilateral interstitial opacities are present. New moderate to large sized RIGHT LOWER lung opacity noted. Small bilateral pleural effusions have slightly increased. There is no evidence of  pneumothorax. IMPRESSION: 1. Increasing interstitial pulmonary edema and slightly increased small bilateral pleural effusions. 2. New moderate to large sized RIGHT LOWER lung opacity which may represent airspace disease/pneumonia, atelectasis and/or effusion. Electronically Signed   By: Margarette Canada M.D.   On: 03/23/2022 08:24   DG Abd Portable 1V  Result Date: 03/22/2022 CLINICAL DATA:  Encounter for nasogastric tube placement. EXAM: PORTABLE ABDOMEN - 1 VIEW COMPARISON:  03/20/2022 FINDINGS: Nasogastric tube tip is in the left abdomen and likely within the stomach. Evidence for a PICC line tip in the SVC region but only partially imaged. Limited evaluation of the lung bases. Skin staples in the abdomen. Small amount of  gas within the stomach. IMPRESSION: Nasogastric tube is in the left abdomen and expected region of the stomach. Electronically Signed   By: Markus Daft M.D.   On: 03/22/2022 07:56    Abx: Oral vanc 11/23-c Iv metronidazole 11/22-c  Piptazo 11/22-c   Last oral vanc course 10/31-11/17 with planned long tapered course for recurrent cdiff   Assessment/Plan:  Perforated abdomen from probable cdifficile toxic megacolon.  peritonitis S/p subtotal colectomy with end ileostomy 03/20/22 Tpn status   Interestingly 11/11 cdiff pcr was negative.  11/23 off pressors 11/24 -- ngt removed; ileostomy functioning; diet advanced to FLD; wbc down 40s --> 20s  Agree that with source control could stop piptazo on 11/25, especially with concern for severe cdiff  Given tolerating diet and ileus has resolved however still with osteomy will keep on po vanc and flagyl    Discussed with primary team      Jabier Mutton, [place] [facility] [phone]  pager   [phone] cell 03/23/2022, 12:24 PM   03/23/2022, 12:24 PM

## 2022-03-24 DIAGNOSIS — K567 Ileus, unspecified: Secondary | ICD-10-CM | POA: Diagnosis not present

## 2022-03-24 LAB — GLUCOSE, CAPILLARY
Glucose-Capillary: 100 mg/dL — ABNORMAL HIGH (ref 70–99)
Glucose-Capillary: 128 mg/dL — ABNORMAL HIGH (ref 70–99)
Glucose-Capillary: 135 mg/dL — ABNORMAL HIGH (ref 70–99)
Glucose-Capillary: 135 mg/dL — ABNORMAL HIGH (ref 70–99)
Glucose-Capillary: 147 mg/dL — ABNORMAL HIGH (ref 70–99)
Glucose-Capillary: 107 mg/dL — ABNORMAL HIGH (ref 70–99)

## 2022-03-24 LAB — BASIC METABOLIC PANEL
Anion gap: 7 (ref 5–15)
BUN: 28 mg/dL — ABNORMAL HIGH (ref 8–23)
CO2: 31 mmol/L (ref 22–32)
Calcium: 9.2 mg/dL (ref 8.9–10.3)
Chloride: 107 mmol/L (ref 98–111)
Creatinine, Ser: 0.51 mg/dL (ref 0.44–1.00)
GFR, Estimated: >60 mL/min (ref >60)
Glucose, Bld: 139 mg/dL — ABNORMAL HIGH (ref 70–99)
Potassium: 3.9 mmol/L (ref 3.5–5.1)
Sodium: 145 mmol/L (ref 135–145)

## 2022-03-24 LAB — CBC
HCT: 25.4 % — ABNORMAL LOW (ref 36.0–46.0)
Hemoglobin: 8.3 g/dL — ABNORMAL LOW (ref 12.0–15.0)
MCH: 30.2 pg (ref 26.0–34.0)
MCHC: 32.7 g/dL (ref 30.0–36.0)
MCV: 92.4 fL (ref 80.0–100.0)
Platelets: 336 10*3/uL (ref 150–400)
RBC: 2.75 MIL/uL — ABNORMAL LOW (ref 3.87–5.11)
RDW: 18.1 % — ABNORMAL HIGH (ref 11.5–15.5)
WBC: 18.2 10*3/uL — ABNORMAL HIGH (ref 4.0–10.5)
nRBC: 0 % (ref 0.0–0.2)

## 2022-03-24 LAB — PHOSPHORUS: Phosphorus: 3.1 mg/dL (ref 2.5–4.6)

## 2022-03-24 LAB — MAGNESIUM: Magnesium: 2.3 mg/dL (ref 1.7–2.4)

## 2022-03-24 MED ORDER — TRACE MINERALS CU-MN-SE-ZN 300-55-60-3000 MCG/ML IV SOLN
INTRAVENOUS | Status: AC
  Filled 2022-03-24: qty 464

## 2022-03-24 MED ORDER — SODIUM CHLORIDE 0.9% FLUSH: 10.0000 mL | INTRAVENOUS | Status: DC | PRN

## 2022-03-24 MED ORDER — INSULIN ASPART 100 UNIT/ML IJ SOLN
0.0000 [IU] | INTRAMUSCULAR | Status: DC
  Administered 2022-03-24 (×2): 1 [IU] via SUBCUTANEOUS
  Administered 2022-03-25 (×2): 2 [IU] via SUBCUTANEOUS
  Administered 2022-03-25 – 2022-03-26 (×3): 1 [IU] via SUBCUTANEOUS
  Administered 2022-03-26: 2 [IU] via SUBCUTANEOUS
  Administered 2022-03-26 – 2022-03-27 (×3): 1 [IU] via SUBCUTANEOUS

## 2022-03-24 NOTE — Progress Notes (Addendum)
PHARMACY - TOTAL PARENTERAL NUTRITION CONSULT NOTE  Indication:  prolonged hospitalization with severe malnutrition/anasarca  Patient Measurements: Height: '5\' 2"'$  (157.5 cm) Weight: 48.1 kg (106 lb 0.7 oz) IBW/kg (Calculated) : 50.1 TPN AdjBW (KG): 41.3 Body mass index is 19.4 kg/m. Usual Weight: 49.5 kg  Assessment:  [name] with malnutrition d/t multiple factors including dementia, recurrent C diff with multiple admissions and GI intolerance, and difficulty chewing with ill-fitting dentures. Pt had poor PO intake prior to admit and reports nut allergy and lactose intolerance. Pharmacy consulted for TPN in setting of malnutrition and inability to meet needs with PO intake.    Patient has reported nut allergy (peanuts) - low risk of cross reactivity with SMOFlipid ingredient. Upon asking for rxn/details, pt and pt spouse do not remember pt having a nut allergy.  Have been tolerating SMOFlipid.  Glucose / Insulin: A1c 5.4% - CBGs mostly controlled <180 (range 70-231) Used 3 units moderate SSI in past 24 hrs, 231 was via CMP not fingerstick  S/p methylpred '20mg'$  11/17-11/21 for colitis Electrolytes: K: 3.9 s/p 53mQ IV, Mag: 2.3, CoCa: 10.5, phos: 2.3 s/o 345ml KPhos, Na: 145, all others WNL from 11/25  Goal K>/=4 and Mag >/=2 with hx afib Renal: SCr stable <1, BUN down to 24 Hepatic: LFTs / Tbili / TG WNL, albumin 2.8 Intake / Output; MIVF: UOP 1 mL/kg/hr, ileostomy output 30 ml, LBM 11/23, Lasix 40 IV daily (dose reduced 11/20)  GI Imaging: 11/6 CTAP: persistent colonic distention , cannot exclude toxic megacolon 11/9 Ab Xray: decrease in gas distention, improved ileus  11/10 Ab Xray: improvement in gaseous distention 11/12 Ab Xray: no bowel dilation to suggest obstruction/ileus 11/16 Flex sigmoidoscopy: colon bx, mod diverticulosis 1/17 Abd XR: no bowel obstruction GI Surgeries / Procedures:  11/22: Subtotal colectomy with end ileostomy  Central access: PICC placed 03/10/22 TPN  start date: 03/10/2022  Nutritional Goals: RD Estimated Needs Total Energy Estimated Needs: 1400-1600 Total Protein Estimated Needs: 70-85g Total Fluid Estimated Needs: 1.4-1.6L  Current Nutrition:  11/11 TPN & NPO 11/16-11/21 dysphagia 3 diet 11/23 sips of clears (tolerated) >> CLD 11/24 advanced to FLD  Plan:  Continue concentrated TPN at goal rate 50 mL/hr to provide 100% of needs (1451 kCal and 70g AA) Electrolytes in TPN: Reduce Na 10046mL, Increase K 35m11m (previously had been on 65mE28mwhile getting diuresis, monitor as will likely need decrease if lasix is stopped), Ca 3mEq/38mMg 13mEq/63mhos 30mmol/44mmax Cl Will replete as needed outside of TPN for additional K/Phos as phos within TPN is at max.  Add standard MVI and trace elements to TPN Reduce to sensitive SSI Q4H. adjust as needed, patient has had liable blood sugars with PO intake.  Monitor TPN labs daily until stable, then standard on Mon/Thurs  Dashun Borre Esmeralda Arthur  Please check AMION for all [facility] numbers Clinical Pharmacist 03/24/2022 9:04 AM

## 2022-03-24 NOTE — Progress Notes (Signed)
Progress Note  3 Days Post-Op  Subjective: Son at bedside this AM. Pt drinking a lot of water.  Having occasional nausea.  She is having ileostomy output.   Objective: Vital signs in last 24 hours: Temp:  [97.6 F (36.4 C)-98.9 F (37.2 C)] 98.4 F (36.9 C) (11/25 0712) Pulse Rate:  [68-94] 78 (11/25 0712) Resp:  [16-31] 25 (11/25 0712) BP: (93-127)/(39-58) 93/48 (11/25 0712) SpO2:  [91 %-100 %] 100 % (11/25 0712) Weight:  [48.1 kg] 48.1 kg (11/25 0328) Last BM Date : 03/23/22  Intake/Output from previous day: 11/24 0701 - 11/25 0700 In: 0  Out: 1230 [Urine:1200; Stool:30] Intake/Output this shift: Total I/O In: -  Out: 400 [Urine:400]  PE: General: pleasant, WD, frail elderly female who is laying in bed in NAD Heart: regular, rate, and rhythm.   Lungs: CTAB, no wheezes, rhonchi, or rales noted.  Respiratory effort nonlabored Abd: soft, NT, no distention, ileostomy viable with small amount liquid dark green output, surgical dressing intact    Lab Results:  Recent Labs    03/23/22 0852 03/24/22 0350  WBC 26.2* 18.2*  HGB 9.1* 8.3*  HCT 28.0* 25.4*  PLT 375 336    BMET Recent Labs    03/23/22 1112 03/24/22 0350  NA 143 145  K 2.3* 3.9  CL 106 107  CO2 29 31  GLUCOSE 231* 139*  BUN 24* 28*  CREATININE 0.45 0.51  CALCIUM 8.6* 9.2    PT/INR No results for input(s): "LABPROT", "INR" in the last 72 hours.  CMP     Component Value Date/Time   NA 145 03/24/2022 0350   K 3.9 03/24/2022 0350   CL 107 03/24/2022 0350   CO2 31 03/24/2022 0350   GLUCOSE 139 (H) 03/24/2022 0350   GLUCOSE 99 05/14/2006 0941   BUN 28 (H) 03/24/2022 0350   CREATININE 0.51 03/24/2022 0350   CALCIUM 9.2 03/24/2022 0350   PROT 5.4 (L) 03/23/2022 1112   ALBUMIN 2.4 (L) 03/23/2022 1112   AST 19 03/23/2022 1112   ALT 32 03/23/2022 1112   ALKPHOS 69 03/23/2022 1112   BILITOT 0.3 03/23/2022 1112   GFRNONAA >60 03/24/2022 0350   GFRAA >60 07/02/2019 1151   Lipase      Component Value Date/Time   LIPASE 45 03/05/2022 1607       Studies/Results: DG CHEST PORT 1 VIEW  Result Date: 03/23/2022 CLINICAL DATA:  Respiratory distress. EXAM: PORTABLE CHEST 1 VIEW COMPARISON:  03/20/2022 and prior studies FINDINGS: Cardiomediastinal silhouette is unchanged. A RIGHT PICC line is identified with tip overlying the LOWER SVC. Increasing pulmonary vascular congestion and mild bilateral interstitial opacities are present. New moderate to large sized RIGHT LOWER lung opacity noted. Small bilateral pleural effusions have slightly increased. There is no evidence of pneumothorax. IMPRESSION: 1. Increasing interstitial pulmonary edema and slightly increased small bilateral pleural effusions. 2. New moderate to large sized RIGHT LOWER lung opacity which may represent airspace disease/pneumonia, atelectasis and/or effusion. Electronically Signed   By: Margarette Canada M.D.   On: 03/23/2022 08:24    Anti-infectives: Anti-infectives (From admission, onward)    Start     Dose/Rate Route Frequency Ordered Stop   05/17/22 1000  vancomycin (VANCOCIN) capsule 125 mg  Status:  Discontinued       See Hyperspace for full Linked Orders Report.   125 mg Oral Every 3 DAYS 03/22/22 0930 03/22/22 1235   04/28/22 1000  vancomycin (VANCOCIN) capsule 125 mg  See Hyperspace for full Linked Orders Report.   125 mg Oral Every 3 DAYS 03/23/22 1257 05/07/22 0959   04/20/22 1000  vancomycin (VANCOCIN) capsule 125 mg       See Hyperspace for full Linked Orders Report.   125 mg Oral Every other day 03/23/22 1257 04/28/22 0959   04/19/22 1000  vancomycin (VANCOCIN) capsule 125 mg  Status:  Discontinued       See Hyperspace for full Linked Orders Report.   125 mg Oral Every other day 03/22/22 0930 03/22/22 1235   04/13/22 1000  vancomycin (VANCOCIN) capsule 125 mg       See Hyperspace for full Linked Orders Report.   125 mg Oral Daily 03/23/22 1257 04/20/22 0959   04/12/22 1000  vancomycin  (VANCOCIN) capsule 125 mg  Status:  Discontinued       See Hyperspace for full Linked Orders Report.   125 mg Oral Daily 03/22/22 0930 03/22/22 1235   04/05/22 2200  vancomycin (VANCOCIN) capsule 125 mg       See Hyperspace for full Linked Orders Report.   125 mg Oral 2 times daily 03/23/22 1257 04/12/22 2159   04/05/22 1000  vancomycin (VANCOCIN) capsule 125 mg  Status:  Discontinued       See Hyperspace for full Linked Orders Report.   125 mg Oral 2 times daily 03/22/22 0930 03/22/22 1235   03/23/22 1400  metroNIDAZOLE (FLAGYL) tablet 500 mg        500 mg Oral Every 8 hours 03/23/22 1255 04/03/22 1359   03/23/22 1400  vancomycin (VANCOCIN) capsule 125 mg       See Hyperspace for full Linked Orders Report.   125 mg Oral 4 times daily 03/23/22 1257 04/05/22 1359   03/22/22 1300  vancomycin (VANCOCIN) capsule 125 mg  Status:  Discontinued        125 mg Oral 4 times daily 03/22/22 0930 03/23/22 1257   03/22/22 1030  vancomycin (VANCOCIN) capsule 125 mg  Status:  Discontinued       See Hyperspace for full Linked Orders Report.   125 mg Oral 4 times daily 03/22/22 0930 03/22/22 1235   03/21/22 1415  metroNIDAZOLE (FLAGYL) IVPB 500 mg  Status:  Discontinued        500 mg 100 mL/hr over 60 Minutes Intravenous Every 8 hours 03/21/22 1350 03/23/22 1255   03/21/22 0100  piperacillin-tazobactam (ZOSYN) IVPB 3.375 g        3.375 g 12.5 mL/hr over 240 Minutes Intravenous Every 8 hours 03/21/22 0004 03/24/22 2159   03/16/22 0800  vancomycin (VANCOCIN) capsule 125 mg  Status:  Discontinued       See Hyperspace for full Linked Orders Report.   125 mg Oral Daily with breakfast 03/06/22 0220 03/06/22 1240   03/09/22 2200  vancomycin (VANCOCIN) capsule 125 mg       Note to Pharmacy: 02/27/22 - 03/08/22: Take 1 capsule four times daily; 11/10- 11/16: take 1 capsule two times daily; 11/17 - 11/23: take 1 capsule once daily; from 11/24 and on, see other prescrption sent to [facility]     125 mg Oral 2 times daily  03/09/22 1635 03/14/22 2126   03/09/22 1730  azithromycin (ZITHROMAX) 1,000 mg in dextrose 5 % 250 mL IVPB        1,000 mg 260 mL/hr over 60 Minutes Intravenous Every 24 hours 03/09/22 1635 03/09/22 2024   03/09/22 0800  vancomycin (VANCOCIN) capsule 125 mg  Status:  Discontinued  See Hyperspace for full Linked Orders Report.   125 mg Oral 2 times daily with meals 03/06/22 0220 03/06/22 1240   03/06/22 1800  vancomycin (VANCOCIN) 500 mg in sodium chloride irrigation 0.9 % 100 mL ENEMA  Status:  Discontinued        500 mg Rectal Every 6 hours 03/06/22 1429 03/06/22 1528   03/06/22 1800  vancomycin (VANCOCIN) capsule 125 mg       Note to Pharmacy: 02/27/22 - 03/08/22: Take 1 capsule four times daily; 11/10- 11/16: take 1 capsule two times daily; 11/17 - 11/23: take 1 capsule once daily; from 11/24 and on, see other prescrption sent [facility]     125 mg Oral Every 6 hours 03/06/22 1528 03/09/22 1300   03/06/22 1300  metroNIDAZOLE (FLAGYL) IVPB 500 mg  Status:  Discontinued        500 mg 100 mL/hr over 60 Minutes Intravenous Every 8 hours 03/06/22 1249 03/08/22 0838   03/06/22 0800  vancomycin (VANCOCIN) capsule 125 mg  Status:  Discontinued       Note to Pharmacy: 02/27/22 - 03/08/22: Take 1 capsule four times daily; 11/10- 11/16: take 1 capsule two times daily; 11/17 - 11/23: take 1 capsule once daily; from 11/24 and on, see other prescrption sent [facility]     125 mg Oral 3 times daily before meals & bedtime 03/06/22 0216 03/06/22 1240        Assessment/Plan  POD 3 s/p Subtotal colectomy with end ileostomy for Pneumoperitoneum by Dr. Thermon Leyland on 03/20/22 Findings: Massively dilated proximal colon, pneumoperitoneum, no large clear enterotomy identified. Resected from TI to distal descending colon. Has long rectosigmoid stump  - Cont TPN - will wean once tolerating a diet better - ok to have clears or fulls - Cont abx - WOCN for new ostomy - Surgical path with no dysplasia or malignancy  identified   FEN - FLD, TPN VTE - ok for hep gtt ID - Flagyl 11/7 - 11/9. Vanc 11/7 - 11/16. Zosyn 11/22 >> WBC decreasing Foley - ok to DC from surgical standpoint    Dementia PAF on Eliquis at baseline Recurrent C. Diff - C. Diff was negative 11/10. Tx w/ Vanc as above per ID  GI panel + for E. Coli HFpEF    LOS: 77 days     Rosario Adie, [facility] 03/24/2022, 9:46 AM Please see Amion for pager number during day hours 7:00am-4:30pm

## 2022-03-24 NOTE — Progress Notes (Signed)
PROGRESS NOTE    Julie Montgomery  WUJ:[id number] DOB: 30-Mar-1936 DOA: 03/05/2022 PCP: Cassandria Anger, MD    Brief Narrative:  86 year old with history of dementia, recurrent C. difficile infection, permanent A-fib on Eliquis, chronic hyponatremia who was admitted with several days of poor oral intake, diarrhea. 11/6 admitted for FTT - BMI 17 11/9 started TPN 11/16 flex sig> congested mucosa, diverticula. Path not consistent with microscopic colitis or IBD.   11/22 perforated> colon resection with diverting ileostomy -> In ICU post-op intubated, requiring sedation due to agitation. ID supect perf abd from toxic megacolon. Rx resume oral vanc and IV flagyl and post op zosyn  Extubated in PM   Transferred to medical floor 11/23. Marland Kitchen  A-fib with RVR and on amiodarone infusion along with heparin.  Assessment & Plan:   Acute intestinal perforation secondary to C diff toxic megacolon: 11/22: Colectomy and diverting ileostomy.  Surgically improving.  On full liquid diet today. Followed by ID, remains on oral Flagyl and oral vancomycin.  Prolonged therapy will be needed. Mobilize.  Advance diet.  Patient continued to be on Zosyn due to contaminated abdomen is recommended until 11/25. Remains on TPN to augment nutrition.  Paroxysmal A-fib with RVR: Recurrent pauses. Rate controlled in sinus rhythm today.  Patient was on amiodarone infusion.  Changing to amiodarone 200 mg twice daily. seen by EP cardiology.  We will discuss about rate control regimen when she is near to discharge. Heparin converted to Eliquis.  Severe protein calorie malnutrition: TPN at goal.  Advancing diet.  Electrolytes including Hypokalemia/hypomagnesemia/hypophosphatemia: Stable.  Replaced.  ICU acquired delirium: Stabilizing.     DVT prophylaxis: apixaban (ELIQUIS) tablet 2.5 mg Start: 03/23/22 1130 SCDs Start: 03/05/22 2227 apixaban (ELIQUIS) tablet 2.5 mg   Code Status: Full code Family Communication:  Husband and son at the bedside Disposition Plan: Status is: Inpatient Remains inpatient appropriate because: Severe critical illness     Consultants:  Critical care General surgery Gastroenterology Infectious disease  Procedures:  Colectomy and ileostomy  Antimicrobials:  Vancomycin and Flagyl Zosyn 11/22---   Subjective: Patient seen and examined.  She had episodic abdominal discomfort and intolerance to liquid however she is trying to take large amount of liquid at 1 time.  Complaints of moderate soreness of the abdomen.  Family at the bedside.  Patient tends to eat large portion at 1 time. Telemetry with sinus rhythm.  Blood pressures are stable.  Objective: Vitals:   03/24/22 0017 03/24/22 0328 03/24/22 0712 03/24/22 1244  BP: (!) 93/42 (!) 105/56 (!) 93/48 (!) 107/57  Pulse: 71 72 78 75  Resp: 17 16 (!) 25 (!) 21  Temp:  97.6 F (36.4 C) 98.4 F (36.9 C) 97.7 F (36.5 C)  TempSrc:  Oral Oral Axillary  SpO2: 97% 93% 100% 100%  Weight:  48.1 kg    Height:        Intake/Output Summary (Last 24 hours) at 03/24/2022 1342 Last data filed at 03/24/2022 0805 Gross per 24 hour  Intake 0 ml  Output 1150 ml  Net -1150 ml    Filed Weights   03/22/22 0500 03/23/22 0600 03/24/22 0328  Weight: 43.1 kg 43.7 kg 48.1 kg    Examination:  General exam: Appears calm and comfortable  Cachectic.  Thin and frail.  Debilitated.  Pale looking. Respiratory system: Clear to auscultation. Respiratory effort normal. Cardiovascular system: S1 & S2 heard, RRR.  No pedal edema. Gastrointestinal system: Mild tenderness without rigidity or guarding.  Ileostomy with loose  stool.  Midline surgical incision with some tenderness but looks clean and dry. Central nervous system: Alert and oriented x2-3.Marland Kitchen No focal neurological deficits.    Data Reviewed: I have personally reviewed following labs and imaging studies  CBC: Recent Labs  Lab 03/18/22 0250 03/19/22 0350 03/20/22 0508  03/21/22 0635 03/21/22 0737 03/22/22 0557 03/23/22 0852 03/24/22 0350  WBC 16.7* 14.6* 15.5*  --  41.5* 22.9* 26.2* 18.2*  NEUTROABS 14.3* 12.1* 12.8*  --  38.6* 20.9*  --   --   HGB 9.1* 9.2* 9.6* 9.9* 9.8* 8.8* 9.1* 8.3*  HCT 27.0* 28.1* 29.5* 29.0* 30.2* 27.9* 28.0* 25.4*  MCV 91.2 93.0 93.4  --  94.4 96.5 94.9 92.4  PLT 334 397 465*  --  519* 384 375 371    Basic Metabolic Panel: Recent Labs  Lab 03/21/22 0737 03/21/22 2155 03/22/22 0557 03/22/22 2314 03/23/22 0852 03/23/22 1112 03/24/22 0350  NA 138   < > 143 145 147* 143 145  K 4.7   < > 4.1 2.8* 3.5 2.3* 3.9  CL 104   < > 109 108 109 106 107  CO2 26   < > '27 29 29 29 31  '$ GLUCOSE 177*   < > 176* 187* 540* 231* 139*  BUN 48*   < > 31* 24* 24* 24* 28*  CREATININE 0.57   < > 0.42* 0.45 0.43* 0.45 0.51  CALCIUM 9.4   < > 8.6* 8.7* 8.6* 8.6* 9.2  MG 2.1   < > 2.0 2.0 2.5* 1.8 2.3  PHOS 4.0  --  2.8  --  5.0* 2.2* 3.1   < > = values in this interval not displayed.    GFR: Estimated Creatinine Clearance: 39.7 mL/min (by C-G formula based on SCr of 0.51 mg/dL). Liver Function Tests: Recent Labs  Lab 03/19/22 0350 03/21/22 0737 03/22/22 0557 03/23/22 0852 03/23/22 1112  AST 38 37 '22 21 19  '$ ALT 50* 62* 41 30 32  ALKPHOS 91 77 73 67 69  BILITOT 0.3 0.5 0.3 0.4 0.3  PROT 5.7* 5.7* 5.3* 4.9* 5.4*  ALBUMIN 2.8* 3.3* 2.8* 2.3* 2.4*    No results for input(s): "LIPASE", "AMYLASE" in the last 168 hours. No results for input(s): "AMMONIA" in the last 168 hours. Coagulation Profile: Recent Labs  Lab 03/21/22 0140  INR 1.1    Cardiac Enzymes: No results for input(s): "CKTOTAL", "CKMB", "CKMBINDEX", "TROPONINI" in the last 168 hours. BNP (last 3 results) Recent Labs    01/26/22 1447  PROBNP 68.0    HbA1C: No results for input(s): "HGBA1C" in the last 72 hours. CBG: Recent Labs  Lab 03/23/22 2043 03/23/22 2339 03/24/22 0328 03/24/22 0800 03/24/22 1242  GLUCAP 162* 85 100* 128* 135*    Lipid  Profile: Recent Labs    03/22/22 0557  TRIG 32    Thyroid Function Tests: No results for input(s): "TSH", "T4TOTAL", "FREET4", "T3FREE", "THYROIDAB" in the last 72 hours. Anemia Panel: No results for input(s): "VITAMINB12", "FOLATE", "FERRITIN", "TIBC", "IRON", "RETICCTPCT" in the last 72 hours. Sepsis Labs: No results for input(s): "PROCALCITON", "LATICACIDVEN" in the last 168 hours.  No results found for this or any previous visit (from the past 240 hour(s)).       Radiology Studies: DG CHEST PORT 1 VIEW  Result Date: 03/23/2022 CLINICAL DATA:  Respiratory distress. EXAM: PORTABLE CHEST 1 VIEW COMPARISON:  03/20/2022 and prior studies FINDINGS: Cardiomediastinal silhouette is unchanged. A RIGHT PICC line is identified with tip overlying the LOWER SVC.  Increasing pulmonary vascular congestion and mild bilateral interstitial opacities are present. New moderate to large sized RIGHT LOWER lung opacity noted. Small bilateral pleural effusions have slightly increased. There is no evidence of pneumothorax. IMPRESSION: 1. Increasing interstitial pulmonary edema and slightly increased small bilateral pleural effusions. 2. New moderate to large sized RIGHT LOWER lung opacity which may represent airspace disease/pneumonia, atelectasis and/or effusion. Electronically Signed   By: Margarette Canada M.D.   On: 03/23/2022 08:24        Scheduled Meds:  amiodarone  200 mg Oral BID   apixaban  2.5 mg Oral BID   Chlorhexidine Gluconate Cloth  6 each Topical Daily   furosemide  40 mg Intravenous Daily   Gerhardt's butt cream   Topical BID   insulin aspart  0-9 Units Subcutaneous Q4H   leptospermum manuka honey  1 Application Topical Daily   lidocaine  1 patch Transdermal Q24H   memantine  5 mg Oral BID   metroNIDAZOLE  500 mg Oral Q8H   sodium chloride flush  10-40 mL Intracatheter Q12H   vancomycin  125 mg Oral QID   Followed by   Derrill Memo ON 04/05/2022] vancomycin  125 mg Oral BID   Followed by    Derrill Memo ON 04/13/2022] vancomycin  125 mg Oral Daily   Followed by   Derrill Memo ON 04/20/2022] vancomycin  125 mg Oral QODAY   Followed by   Derrill Memo ON 04/28/2022] vancomycin  125 mg Oral Q3 days   Vitamin D (Ergocalciferol)  50,000 Units Oral Q7 days   Continuous Infusions:  piperacillin-tazobactam (ZOSYN)  IV 3.375 g (03/24/22 0616)   promethazine (PHENERGAN) injection (IM or IVPB) 12.5 mg (03/23/22 1751)   TPN ADULT (ION) 50 mL/hr at 03/23/22 1711   TPN ADULT (ION)       LOS: 18 days    Time spent: 35 minutes    Barb Merino, MD [facility] Pager [phone]

## 2022-03-25 DIAGNOSIS — K567 Ileus, unspecified: Secondary | ICD-10-CM | POA: Diagnosis not present

## 2022-03-25 LAB — GLUCOSE, CAPILLARY
Glucose-Capillary: 142 mg/dL — ABNORMAL HIGH (ref 70–99)
Glucose-Capillary: 101 mg/dL — ABNORMAL HIGH (ref 70–99)
Glucose-Capillary: 169 mg/dL — ABNORMAL HIGH (ref 70–99)
Glucose-Capillary: 91 mg/dL (ref 70–99)
Glucose-Capillary: 154 mg/dL — ABNORMAL HIGH (ref 70–99)
Glucose-Capillary: 94 mg/dL (ref 70–99)

## 2022-03-25 LAB — CBC
HCT: 25.2 % — ABNORMAL LOW (ref 36.0–46.0)
Hemoglobin: 8.2 g/dL — ABNORMAL LOW (ref 12.0–15.0)
MCH: 29.9 pg (ref 26.0–34.0)
MCHC: 32.5 g/dL (ref 30.0–36.0)
MCV: 92 fL (ref 80.0–100.0)
Platelets: 314 10*3/uL (ref 150–400)
RBC: 2.74 MIL/uL — ABNORMAL LOW (ref 3.87–5.11)
RDW: 17.9 % — ABNORMAL HIGH (ref 11.5–15.5)
WBC: 12.7 10*3/uL — ABNORMAL HIGH (ref 4.0–10.5)
nRBC: 0 % (ref 0.0–0.2)

## 2022-03-25 LAB — PHOSPHORUS: Phosphorus: 2.5 mg/dL (ref 2.5–4.6)

## 2022-03-25 LAB — BASIC METABOLIC PANEL
Anion gap: 5 (ref 5–15)
BUN: 27 mg/dL — ABNORMAL HIGH (ref 8–23)
CO2: 28 mmol/L (ref 22–32)
Calcium: 8.3 mg/dL — ABNORMAL LOW (ref 8.9–10.3)
Chloride: 105 mmol/L (ref 98–111)
Creatinine, Ser: 0.35 mg/dL — ABNORMAL LOW (ref 0.44–1.00)
GFR, Estimated: >60 mL/min (ref >60)
Glucose, Bld: 149 mg/dL — ABNORMAL HIGH (ref 70–99)
Potassium: 4.2 mmol/L (ref 3.5–5.1)
Sodium: 138 mmol/L (ref 135–145)

## 2022-03-25 LAB — MAGNESIUM: Magnesium: 1.9 mg/dL (ref 1.7–2.4)

## 2022-03-25 MED ORDER — TRACE MINERALS CU-MN-SE-ZN 300-55-60-3000 MCG/ML IV SOLN
INTRAVENOUS | Status: AC
  Filled 2022-03-25: qty 464

## 2022-03-25 MED ORDER — MAGNESIUM SULFATE 2 GM/50ML IV SOLN
2.0000 g | Freq: Once | INTRAVENOUS | Status: AC
  Administered 2022-03-25: 2 g via INTRAVENOUS
  Filled 2022-03-25: qty 50

## 2022-03-25 MED ORDER — CALCIUM POLYCARBOPHIL 625 MG PO TABS
625.0000 mg | ORAL_TABLET | Freq: Every day | ORAL | Status: DC
  Administered 2022-03-25 – 2022-03-27 (×3): 625 mg via ORAL
  Filled 2022-03-25 (×3): qty 1

## 2022-03-25 MED ORDER — OXYCODONE-ACETAMINOPHEN 5-325 MG PO TABS
1.0000 | ORAL_TABLET | ORAL | Status: DC | PRN
  Administered 2022-03-26 – 2022-03-30 (×5): 1 via ORAL
  Filled 2022-03-25 (×6): qty 1

## 2022-03-25 MED ORDER — TRACE MINERALS CU-MN-SE-ZN 300-55-60-3000 MCG/ML IV SOLN
INTRAVENOUS | Status: DC
  Filled 2022-03-25 (×2): qty 464

## 2022-03-25 NOTE — Progress Notes (Signed)
4 Days Post-Op   Subjective/Chief Complaint: Some nausea with eating   Objective: Vital signs in last 24 hours: Temp:  [96.7 F (35.9 C)-98.4 F (36.9 C)] 96.7 F (35.9 C) (11/26 0731) Pulse Rate:  [63-77] 71 (11/26 0731) Resp:  [15-21] 17 (11/26 0731) BP: (94-120)/(50-62) 107/50 (11/26 0731) SpO2:  [100 %] 100 % (11/26 0731) Last BM Date : 03/23/22  Intake/Output from previous day: 11/25 0701 - 11/26 0700 In: 2228 [I.V.:1910.5; IV Piggyback:317.5] Out: 4008 [Urine:2400; QPYPP:5093] Intake/Output this shift: No intake/output data recorded.  Abd ileostomy pink with plenty of output, dressing dry, approp tender  Lab Results:  Recent Labs    03/24/22 0350 03/25/22 0602  WBC 18.2* 12.7*  HGB 8.3* 8.2*  HCT 25.4* 25.2*  PLT 336 314   BMET Recent Labs    03/24/22 0350 03/25/22 0602  NA 145 138  K 3.9 4.2  CL 107 105  CO2 31 28  GLUCOSE 139* 149*  BUN 28* 27*  CREATININE 0.51 0.35*  CALCIUM 9.2 8.3*   PT/INR No results for input(s): "LABPROT", "INR" in the last 72 hours. ABG No results for input(s): "PHART", "HCO3" in the last 72 hours.  Invalid input(s): "PCO2", "PO2"  Studies/Results: No results found.  Anti-infectives: Anti-infectives (From admission, onward)    Start     Dose/Rate Route Frequency Ordered Stop   05/17/22 1000  vancomycin (VANCOCIN) capsule 125 mg  Status:  Discontinued       See Hyperspace for full Linked Orders Report.   125 mg Oral Every 3 DAYS 03/22/22 0930 03/22/22 1235   04/28/22 1000  vancomycin (VANCOCIN) capsule 125 mg       See Hyperspace for full Linked Orders Report.   125 mg Oral Every 3 DAYS 03/23/22 1257 05/07/22 0959   04/20/22 1000  vancomycin (VANCOCIN) capsule 125 mg       See Hyperspace for full Linked Orders Report.   125 mg Oral Every other day 03/23/22 1257 04/28/22 0959   04/19/22 1000  vancomycin (VANCOCIN) capsule 125 mg  Status:  Discontinued       See Hyperspace for full Linked Orders Report.   125 mg  Oral Every other day 03/22/22 0930 03/22/22 1235   04/13/22 1000  vancomycin (VANCOCIN) capsule 125 mg       See Hyperspace for full Linked Orders Report.   125 mg Oral Daily 03/23/22 1257 04/20/22 0959   04/12/22 1000  vancomycin (VANCOCIN) capsule 125 mg  Status:  Discontinued       See Hyperspace for full Linked Orders Report.   125 mg Oral Daily 03/22/22 0930 03/22/22 1235   04/05/22 2200  vancomycin (VANCOCIN) capsule 125 mg       See Hyperspace for full Linked Orders Report.   125 mg Oral 2 times daily 03/23/22 1257 04/12/22 2159   04/05/22 1000  vancomycin (VANCOCIN) capsule 125 mg  Status:  Discontinued       See Hyperspace for full Linked Orders Report.   125 mg Oral 2 times daily 03/22/22 0930 03/22/22 1235   03/23/22 1400  metroNIDAZOLE (FLAGYL) tablet 500 mg        500 mg Oral Every 8 hours 03/23/22 1255 04/03/22 1359   03/23/22 1400  vancomycin (VANCOCIN) capsule 125 mg       See Hyperspace for full Linked Orders Report.   125 mg Oral 4 times daily 03/23/22 1257 04/05/22 1359   03/22/22 1300  vancomycin (VANCOCIN) capsule 125 mg  Status:  Discontinued        125 mg Oral 4 times daily 03/22/22 0930 03/23/22 1257   03/22/22 1030  vancomycin (VANCOCIN) capsule 125 mg  Status:  Discontinued       See Hyperspace for full Linked Orders Report.   125 mg Oral 4 times daily 03/22/22 0930 03/22/22 1235   03/21/22 1415  metroNIDAZOLE (FLAGYL) IVPB 500 mg  Status:  Discontinued        500 mg 100 mL/hr over 60 Minutes Intravenous Every 8 hours 03/21/22 1350 03/23/22 1255   03/21/22 0100  piperacillin-tazobactam (ZOSYN) IVPB 3.375 g        3.375 g 12.5 mL/hr over 240 Minutes Intravenous Every 8 hours 03/21/22 0004 03/24/22 2159   03/16/22 0800  vancomycin (VANCOCIN) capsule 125 mg  Status:  Discontinued       See Hyperspace for full Linked Orders Report.   125 mg Oral Daily with breakfast 03/06/22 0220 03/06/22 1240   03/09/22 2200  vancomycin (VANCOCIN) capsule 125 mg       Note to  Pharmacy: 02/27/22 - 03/08/22: Take 1 capsule four times daily; 11/10- 11/16: take 1 capsule two times daily; 11/17 - 11/23: take 1 capsule once daily; from 11/24 and on, see other prescrption sent to [facility]     125 mg Oral 2 times daily 03/09/22 1635 03/14/22 2126   03/09/22 1730  azithromycin (ZITHROMAX) 1,000 mg in dextrose 5 % 250 mL IVPB        1,000 mg 260 mL/hr over 60 Minutes Intravenous Every 24 hours 03/09/22 1635 03/09/22 2024   03/09/22 0800  vancomycin (VANCOCIN) capsule 125 mg  Status:  Discontinued       See Hyperspace for full Linked Orders Report.   125 mg Oral 2 times daily with meals 03/06/22 0220 03/06/22 1240   03/06/22 1800  vancomycin (VANCOCIN) 500 mg in sodium chloride irrigation 0.9 % 100 mL ENEMA  Status:  Discontinued        500 mg Rectal Every 6 hours 03/06/22 1429 03/06/22 1528   03/06/22 1800  vancomycin (VANCOCIN) capsule 125 mg       Note to Pharmacy: 02/27/22 - 03/08/22: Take 1 capsule four times daily; 11/10- 11/16: take 1 capsule two times daily; 11/17 - 11/23: take 1 capsule once daily; from 11/24 and on, see other prescrption sent to [facility]     125 mg Oral Every 6 hours 03/06/22 1528 03/09/22 1300   03/06/22 1300  metroNIDAZOLE (FLAGYL) IVPB 500 mg  Status:  Discontinued        500 mg 100 mL/hr over 60 Minutes Intravenous Every 8 hours 03/06/22 1249 03/08/22 0838   03/06/22 0800  vancomycin (VANCOCIN) capsule 125 mg  Status:  Discontinued       Note to Pharmacy: 02/27/22 - 03/08/22: Take 1 capsule four times daily; 11/10- 11/16: take 1 capsule two times daily; 11/17 - 11/23: take 1 capsule once daily; from 11/24 and on, see other prescrption sent to [facility]     125 mg Oral 3 times daily before meals & bedtime 03/06/22 0216 03/06/22 1240       Assessment/Plan: POD 4 s/p Subtotal colectomy with end ileostomy for Pneumoperitoneum by Dr. Thermon Leyland on 03/20/22 - soft diet today - Cont abx - WOCN for new ostomy - Path: A. COLON, SUBTOTAL COLECTOMY:  - Dilated colon  with variably flattened intestinal folds.  - Serosal adhesions.  - No perforation identified.  - No dysplasia or malignancy.    FEN -  soft, TPN VTE - ok for hep gtt ID - Flagyl 11/7 - 11/9. Vanc 11/7 - 11/16. Zosyn 11/22 >> WBC 12.7 today Foley - ok to DC from surgical standpoint    Dementia PAF on Eliquis at baseline Recurrent C. Diff - C. Diff was negative 11/10. Tx w/ Vanc as above per ID  GI panel + for E. Coli HFpEF    Rolm Bookbinder 03/25/2022

## 2022-03-25 NOTE — Progress Notes (Addendum)
PHARMACY - TOTAL PARENTERAL NUTRITION CONSULT NOTE  Indication:  prolonged hospitalization with severe malnutrition/anasarca  Patient Measurements: Height: '5\' 2"'$  (157.5 cm) Weight: 48.1 kg (106 lb 0.7 oz) IBW/kg (Calculated) : 50.1 TPN AdjBW (KG): 41.3 Body mass index is 19.4 kg/m. Usual Weight: 49.5 kg  Assessment:  [name] with malnutrition d/t multiple factors including dementia, recurrent C diff with multiple admissions and GI intolerance, and difficulty chewing with ill-fitting dentures. Pt had poor PO intake prior to admit and reports nut allergy and lactose intolerance. Pharmacy consulted for TPN in setting of malnutrition and inability to meet needs with PO intake.    Patient has reported nut allergy (peanuts) - low risk of cross reactivity with SMOFlipid ingredient. Upon asking for rxn/details, pt and pt spouse do not remember pt having a nut allergy.  Have been tolerating SMOFlipid.  Glucose / Insulin: A1c 5.4% - CBGs controlled <180 (range 101-149) Used 3 units moderate SSI in past 24 hrs S/p methylpred '20mg'$  11/17-11/21 for colitis Electrolytes: K: 4.2 s/p 20mq PO, Mag: 1.9, CoCa: 9.6, phos: 2.5, Na: 138 (was 144), all others WNL from 11/26  Goal K>/=4 and Mag >/=2 with hx afib Renal: SCr stable <1, BUN down stable 27  Hepatic: LFTs / Tbili / TG WNL, albumin 2.4 Intake / Output; MIVF: UOP 2.1 mL/kg/hr, ileostomy output 1375 ml, LBM 11/24, Lasix 40 IV daily   GI Imaging: 11/6 CTAP: persistent colonic distention , cannot exclude toxic megacolon 11/9 Ab Xray: decrease in gas distention, improved ileus  11/10 Ab Xray: improvement in gaseous distention 11/12 Ab Xray: no bowel dilation to suggest obstruction/ileus 11/16 Flex sigmoidoscopy: colon bx, mod diverticulosis 1/17 Abd XR: no bowel obstruction GI Surgeries / Procedures:  11/22: Subtotal colectomy with end ileostomy  Central access: PICC placed 03/10/22 TPN start date: 03/10/2022  Nutritional Goals: RD Estimated  Needs Total Energy Estimated Needs: 1400-1600 Total Protein Estimated Needs: 70-85g Total Fluid Estimated Needs: 1.4-1.6L  Current Nutrition:  11/11 TPN & NPO 11/16-11/21 dysphagia 3 diet 11/23 sips of clears (tolerated) >> CLD 11/24 advanced to FLD 11/26 advanced to soft   Plan:  Continue concentrated TPN at goal rate 50 mL/hr to provide 100% of needs (1451 kCal and 70g AA) Electrolytes in TPN: increase Na 12105m/L as Na has trended down from 145 to 138, K 5084mL (11m33motal, monitor as will likely need decrease if lasix is stopped), Ca 3mEq79m increase Mg 15mEq50mCa/Mg total= 18mEq/53mPhos 30mmol/29max Cl Will replete as needed outside of TPN for additional Phos as phos within TPN is at max.  Repleted with 2g of mag for goal > 2 given hx of atrial fib.  Add standard MVI and trace elements to TPN Continue sensitive SSI Q4H.  Monitor TPN labs daily until stable, then standard on Mon/Thurs F/u diet toleration for TPN wean.   Anabelen Kaminsky Esmeralda Arthur  Please check AMION for all [facility] numbers Clinical Pharmacist 03/25/2022 8:01 AM

## 2022-03-25 NOTE — Progress Notes (Signed)
PROGRESS NOTE    Julie Montgomery  ZHG:[id number] DOB: 05-26-1935 DOA: 03/05/2022 PCP: Cassandria Anger, MD    Brief Narrative:  86 year old with history of dementia, recurrent C. difficile infection, permanent A-fib on Eliquis, chronic hyponatremia who was admitted with several days of poor oral intake, diarrhea. 11/6 admitted for FTT - BMI 17 11/9 started TPN 11/16 flex sig> congested mucosa, diverticula. Path not consistent with microscopic colitis or IBD.   11/22 perforated> colon resection with diverting ileostomy -> In ICU post-op intubated, requiring sedation due to agitation. ID supect perf abd from toxic megacolon. Rx resume oral vanc and IV flagyl and post op zosyn  Extubated in PM   Transferred to medical floor 11/23. Marland Kitchen  A-fib with RVR and on amiodarone infusion along with heparin. 11/24, transferred to medical floor. Medically improving.  Remains very frail and debilitated.  Still on TPN.  Assessment & Plan:   Acute intestinal perforation secondary to C diff toxic megacolon: 11/22: Colectomy and diverting ileostomy.  Surgically improving.  On full liquid diet-advancing to soft diet today. Followed by ID, remains on oral Flagyl and oral vancomycin.   Continue Flagyl.  Prolonged oral vancomycin taper as prescribed. Completed IV antibiotic therapy. Remains on TPN to augment nutrition.  Paroxysmal A-fib with RVR: Recurrent pauses. Rate controlled in sinus rhythm.  Was on amiodarone infusion in the ICU.  Currently on amiodarone 200 mg twice daily.  Will discuss with cardiology nearing discharge about discharge regimen.  On Eliquis now.  Severe protein calorie malnutrition: TPN at goal.  Advancing diet.  Continue TPN until she is able to tolerate diet.  Electrolytes including Hypokalemia/hypomagnesemia/hypophosphatemia: Stable.  Replaced.  ICU acquired delirium: Improving.  We will start patient on a small dose of oral pain medications to help with pain control and  mobility.  Mobilize with PT OT.  Foley out.  Change to medical telemetry bed.  Advancing diet.      DVT prophylaxis: apixaban (ELIQUIS) tablet 2.5 mg Start: 03/23/22 1130 SCDs Start: 03/05/22 2227 apixaban (ELIQUIS) tablet 2.5 mg   Code Status: Full code Family Communication: Son at the bedside. Disposition Plan: Status is: Inpatient Remains inpatient appropriate because: Severe critical illness, still on TPN.     Consultants:  Critical care General surgery Gastroenterology Infectious disease  Procedures:  Colectomy and ileostomy  Antimicrobials:  Vancomycin and Flagyl Zosyn 11/22--- 11/26.   Subjective:  Patient was seen and examined.  Son was at the bedside.  Patient complained of occasional soreness in her abdomen.  Denies any nausea vomiting.  She had episode of epigastric pain yesterday after eating and that has resolved now.  Wants to use a small dose of oral pain medications and avoid fentanyl.  Remains in sinus rhythm. She is asking when she can go home.  She would like to go home with home health PT OT when we will let her go home.  Objective: Vitals:   03/24/22 2048 03/24/22 2315 03/25/22 0330 03/25/22 0731  BP: (!) 103/51 120/62 (!) 94/51 (!) 107/50  Pulse: 77 73 69 71  Resp: '18 19 15 17  '$ Temp: 98.4 F (36.9 C) 97.7 F (36.5 C) 97.9 F (36.6 C) (!) 96.7 F (35.9 C)  TempSrc: Axillary Oral Axillary Oral  SpO2: 100% 100% 100% 100%  Weight:      Height:        Intake/Output Summary (Last 24 hours) at 03/25/2022 1142 Last data filed at 03/25/2022 0600 Gross per 24 hour  Intake 2227.98 ml  Output 2975 ml  Net -747.02 ml   Filed Weights   03/22/22 0500 03/23/22 0600 03/24/22 0328  Weight: 43.1 kg 43.7 kg 48.1 kg    Examination:  General exam: Appears calm and comfortable .  Frail and debilitated.  On room air. Respiratory system: Clear to auscultation. Respiratory effort normal. Cardiovascular system: S1 & S2 heard, RRR.  No pedal  edema. Gastrointestinal system: Mild tenderness without rigidity or guarding.  Ileostomy with loose stool.  Midline surgical incision with some tenderness but looks clean and dry. Foley catheter with clear urine. Central nervous system: Alert and oriented.  Awake and interactive.  No focal neurological deficits.    Data Reviewed: I have personally reviewed following labs and imaging studies  CBC: Recent Labs  Lab 03/19/22 0350 03/20/22 0508 03/21/22 0635 03/21/22 0737 03/22/22 0557 03/23/22 0852 03/24/22 0350 03/25/22 0602  WBC 14.6* 15.5*  --  41.5* 22.9* 26.2* 18.2* 12.7*  NEUTROABS 12.1* 12.8*  --  38.6* 20.9*  --   --   --   HGB 9.2* 9.6*   < > 9.8* 8.8* 9.1* 8.3* 8.2*  HCT 28.1* 29.5*   < > 30.2* 27.9* 28.0* 25.4* 25.2*  MCV 93.0 93.4  --  94.4 96.5 94.9 92.4 92.0  PLT 397 465*  --  519* 384 375 336 314   < > = values in this interval not displayed.   Basic Metabolic Panel: Recent Labs  Lab 03/22/22 0557 03/22/22 2314 03/23/22 0852 03/23/22 1112 03/24/22 0350 03/25/22 0602  NA 143 145 147* 143 145 138  K 4.1 2.8* 3.5 2.3* 3.9 4.2  CL 109 108 109 106 107 105  CO2 '27 29 29 29 31 28  '$ GLUCOSE 176* 187* 540* 231* 139* 149*  BUN 31* 24* 24* 24* 28* 27*  CREATININE 0.42* 0.45 0.43* 0.45 0.51 0.35*  CALCIUM 8.6* 8.7* 8.6* 8.6* 9.2 8.3*  MG 2.0 2.0 2.5* 1.8 2.3 1.9  PHOS 2.8  --  5.0* 2.2* 3.1 2.5   GFR: Estimated Creatinine Clearance: 39.7 mL/min (A) (by C-G formula based on SCr of 0.35 mg/dL (L)). Liver Function Tests: Recent Labs  Lab 03/19/22 0350 03/21/22 0737 03/22/22 0557 03/23/22 0852 03/23/22 1112  AST 38 37 '22 21 19  '$ ALT 50* 62* 41 30 32  ALKPHOS 91 77 73 67 69  BILITOT 0.3 0.5 0.3 0.4 0.3  PROT 5.7* 5.7* 5.3* 4.9* 5.4*  ALBUMIN 2.8* 3.3* 2.8* 2.3* 2.4*   No results for input(s): "LIPASE", "AMYLASE" in the last 168 hours. No results for input(s): "AMMONIA" in the last 168 hours. Coagulation Profile: Recent Labs  Lab 03/21/22 0140  INR 1.1    Cardiac Enzymes: No results for input(s): "CKTOTAL", "CKMB", "CKMBINDEX", "TROPONINI" in the last 168 hours. BNP (last 3 results) Recent Labs    01/26/22 1447  PROBNP 68.0   HbA1C: No results for input(s): "HGBA1C" in the last 72 hours. CBG: Recent Labs  Lab 03/24/22 1626 03/24/22 2046 03/24/22 2316 03/25/22 0329 03/25/22 0736  GLUCAP 135* 147* 107* 142* 101*   Lipid Profile: No results for input(s): "CHOL", "HDL", "LDLCALC", "TRIG", "CHOLHDL", "LDLDIRECT" in the last 72 hours.  Thyroid Function Tests: No results for input(s): "TSH", "T4TOTAL", "FREET4", "T3FREE", "THYROIDAB" in the last 72 hours. Anemia Panel: No results for input(s): "VITAMINB12", "FOLATE", "FERRITIN", "TIBC", "IRON", "RETICCTPCT" in the last 72 hours. Sepsis Labs: No results for input(s): "PROCALCITON", "LATICACIDVEN" in the last 168 hours.  No results found for this or any previous visit (from the  past 240 hour(s)).       Radiology Studies: No results found.      Scheduled Meds:  amiodarone  200 mg Oral BID   apixaban  2.5 mg Oral BID   Chlorhexidine Gluconate Cloth  6 each Topical Daily   furosemide  40 mg Intravenous Daily   Gerhardt's butt cream   Topical BID   insulin aspart  0-9 Units Subcutaneous Q4H   leptospermum manuka honey  1 Application Topical Daily   lidocaine  1 patch Transdermal Q24H   memantine  5 mg Oral BID   metroNIDAZOLE  500 mg Oral Q8H   polycarbophil  625 mg Oral Daily   vancomycin  125 mg Oral QID   Followed by   Derrill Memo ON 04/05/2022] vancomycin  125 mg Oral BID   Followed by   Derrill Memo ON 04/13/2022] vancomycin  125 mg Oral Daily   Followed by   Derrill Memo ON 04/20/2022] vancomycin  125 mg Oral QODAY   Followed by   Derrill Memo ON 04/28/2022] vancomycin  125 mg Oral Q3 days   Vitamin D (Ergocalciferol)  50,000 Units Oral Q7 days   Continuous Infusions:  promethazine (PHENERGAN) injection (IM or IVPB) 200 mL/hr at 03/25/22 0600   TPN ADULT (ION) Stopped  (03/25/22 0558)   TPN ADULT (ION)       LOS: 19 days    Time spent: 35 minutes    Barb Merino, MD [facility] Pager [phone]

## 2022-03-26 DIAGNOSIS — K567 Ileus, unspecified: Secondary | ICD-10-CM | POA: Diagnosis not present

## 2022-03-26 LAB — MAGNESIUM: Magnesium: 2 mg/dL (ref 1.7–2.4)

## 2022-03-26 LAB — GLUCOSE, CAPILLARY
Glucose-Capillary: 90 mg/dL (ref 70–99)
Glucose-Capillary: 140 mg/dL — ABNORMAL HIGH (ref 70–99)
Glucose-Capillary: 124 mg/dL — ABNORMAL HIGH (ref 70–99)
Glucose-Capillary: 154 mg/dL — ABNORMAL HIGH (ref 70–99)
Glucose-Capillary: 115 mg/dL — ABNORMAL HIGH (ref 70–99)
Glucose-Capillary: 126 mg/dL — ABNORMAL HIGH (ref 70–99)
Glucose-Capillary: 127 mg/dL — ABNORMAL HIGH (ref 70–99)

## 2022-03-26 LAB — CBC
HCT: 32 % — ABNORMAL LOW (ref 36.0–46.0)
Hemoglobin: 10.5 g/dL — ABNORMAL LOW (ref 12.0–15.0)
MCH: 30.3 pg (ref 26.0–34.0)
MCHC: 32.8 g/dL (ref 30.0–36.0)
MCV: 92.5 fL (ref 80.0–100.0)
Platelets: 406 10*3/uL — ABNORMAL HIGH (ref 150–400)
RBC: 3.46 MIL/uL — ABNORMAL LOW (ref 3.87–5.11)
RDW: 17.7 % — ABNORMAL HIGH (ref 11.5–15.5)
WBC: 17.1 10*3/uL — ABNORMAL HIGH (ref 4.0–10.5)
nRBC: 0 % (ref 0.0–0.2)

## 2022-03-26 LAB — TRIGLYCERIDES: Triglycerides: 53 mg/dL (ref <150)

## 2022-03-26 LAB — PHOSPHORUS: Phosphorus: 2.9 mg/dL (ref 2.5–4.6)

## 2022-03-26 MED ORDER — ENSURE ENLIVE PO LIQD
237.0000 mL | Freq: Three times a day (TID) | ORAL | Status: DC
  Administered 2022-03-26 – 2022-03-30 (×10): 237 mL via ORAL

## 2022-03-26 MED ORDER — TRACE MINERALS CU-MN-SE-ZN 300-55-60-3000 MCG/ML IV SOLN
INTRAVENOUS | Status: AC
  Filled 2022-03-26: qty 232

## 2022-03-26 NOTE — Progress Notes (Signed)
Patient voided only 266m in last 12 hours, bladder scan showed 5649mbladder residual, removed 60055mrine from I&O cath and will continue to monitor closely.

## 2022-03-26 NOTE — Progress Notes (Signed)
Occupational Therapy Treatment Patient Details Name: Julie Montgomery MRN: [id number] DOB: 04-21-1936 Today's Date: 03/26/2022   History of present illness Pt is an 86 y.o. female admitted 03/05/22 with BLE edema, ABd distension, poor PO intake. Pt with recurrent C-diff, colonic ileus, dehydration. Sigmoidoscopy 11/16 with findings of erythmatous and congested mucosa in sigmoid colon, diverticulosis. Afib with RVR 11/18. 11/22 Subtotal colectomy with end ileostomy for Pneumoperitoneum.  Of note, multiple recent admissions the past 3 months with c-diff. PMH includes CHF, PAF, anemia, osteopenia, TIA, chronic hyponatremia.   OT comments  Patient continues to make steady progress towards goals in skilled OT session. [name] session encompassed functional mobility to increase activity tolerance. Patient requiring increased time for all tasks, but benefiting from having family present for session. PT joining OT during session to assist in functional mobility with patient mod A for bed mobility and mod A for sit<>stands, fatiguing quickly with minimal ambulation. OT continuing to recommend [place], but would benefit from a more aggressive approach to improve quality of life.    Recommendations for follow up therapy are one component of a multi-disciplinary discharge planning process, led by the attending physician.  Recommendations may be updated based on patient status, additional functional criteria and insurance authorization.    Follow Up Recommendations  Home health OT     Assistance Recommended at Discharge Frequent or constant Supervision/Assistance  Patient can return home with the following  A lot of help with walking and/or transfers;A lot of help with bathing/dressing/bathroom;Assistance with cooking/housework;Direct supervision/assist for medications management;Direct supervision/assist for financial management;Assist for transportation;Help with stairs or ramp for entrance   Equipment  Recommendations  None recommended by OT    Recommendations for Other Services      Precautions / Restrictions Precautions Precautions: Fall;Other (comment) Precaution Comments: diarrhea, ostomy, bil foot wounds Restrictions Weight Bearing Restrictions: No       Mobility Bed Mobility Overal bed mobility: Needs Assistance Bed Mobility: Supine to Sit     Supine to sit: Max assist, HOB elevated     General bed mobility comments: pt sleeps in lift chair at baseline and transitioned pt from supine to sitting via bed pad under pt    Transfers Overall transfer level: Needs assistance Equipment used: Rolling walker (2 wheels) Transfers: Sit to/from Stand Sit to Stand: Mod assist, +2 physical assistance           General transfer comment: pt with posterior lean requiring incr assist to come forward over her feet     Balance Overall balance assessment: Needs assistance Sitting-balance support: No upper extremity supported, Feet supported, Bilateral upper extremity supported Sitting balance-Leahy Scale: Fair Sitting balance - Comments: fair sitting balance with and without UE support   Standing balance support: Bilateral upper extremity supported Standing balance-Leahy Scale: Poor Standing balance comment: reliant on RW and physical assist for balance                           ADL either performed or assessed with clinical judgement   ADL Overall ADL's : Needs assistance/impaired     Grooming: Set up;Supervision/safety;Wash/dry face;Sitting               Lower Body Dressing: Moderate assistance;Sitting/lateral leans   Toilet Transfer: +2 for safety/equipment;Ambulation;Rolling walker (2 wheels);Moderate assistance Toilet Transfer Details (indicate cue type and reason): simulated via functional mobility         Functional mobility during ADLs: Minimal assistance;Cueing for safety;Cueing for sequencing;Rolling  walker (2 wheels) General ADL Comments:  Session focus on improving activty tolerance with functional mobility    Extremity/Trunk Assessment              Vision       Perception     Praxis      Cognition Arousal/Alertness: Awake/alert Behavior During Therapy: Anxious Overall Cognitive Status: History of cognitive impairments - at baseline Area of Impairment: Memory, Problem solving, Awareness, Following commands, Safety/judgement, Attention                   Current Attention Level: Sustained Memory: Decreased short-term memory Following Commands: Follows one step commands with increased time, Follows one step commands inconsistently Safety/Judgement: Decreased awareness of deficits, Decreased awareness of safety Awareness: Intellectual Problem Solving: Slow processing, Difficulty sequencing, Requires verbal cues, Decreased initiation General Comments: cognitive impairments at baseline, pt with STM challenges, decreased awareness of position in space        Exercises Exercises: General Lower Extremity General Exercises - Lower Extremity Ankle Circles/Pumps: AROM, Both, 5 reps Long Arc Quad: AROM, Both, 10 reps    Shoulder Instructions       General Comments Son and spouse present and very encouraging    Pertinent Vitals/ Pain       Pain Assessment Pain Assessment: Faces Faces Pain Scale: Hurts little more Pain Location: back Pain Descriptors / Indicators: Sore, Nagging  Home Living                                          Prior Functioning/Environment              Frequency  Min 2X/week        Progress Toward Goals  OT Goals(current goals can now be found in the care plan section)  Progress towards OT goals: Progressing toward goals  Acute Rehab OT Goals Patient Stated Goal: to get better OT Goal Formulation: With patient/family Time For Goal Achievement: 04/06/22 Potential to Achieve Goals: Good  Plan Discharge plan remains appropriate     Co-evaluation      Reason for Co-Treatment: Complexity of the patient's impairments (multi-system involvement);To address functional/ADL transfers PT goals addressed during session: Mobility/safety with mobility;Balance;Proper use of DME        AM-PAC OT "6 Clicks" Daily Activity     Outcome Measure   Help from another person eating meals?: A Little Help from another person taking care of personal grooming?: A Little Help from another person toileting, which includes using toliet, bedpan, or urinal?: A Lot Help from another person bathing (including washing, rinsing, drying)?: A Lot Help from another person to put on and taking off regular upper body clothing?: A Lot Help from another person to put on and taking off regular lower body clothing?: A Lot 6 Click Score: 14    End of Session Equipment Utilized During Treatment: Gait belt;Rolling walker (2 wheels)  OT Visit Diagnosis: Unsteadiness on feet (R26.81);Other abnormalities of gait and mobility (R26.89);Muscle weakness (generalized) (M62.81);Other symptoms and signs involving cognitive function;Pain   Activity Tolerance Patient tolerated treatment well   Patient Left in chair;with family/visitor present   Nurse Communication Mobility status        Time: 1331-1400 OT Time Calculation (min): 29 min  Charges: OT General Charges $OT Visit: 1 Visit OT Treatments $Self Care/Home Management : 8-22 mins  Julie Montgomery, OTR/L Acute  Rehabilitation Services [phone]   Ascencion Dike 03/26/2022, 3:17 PM

## 2022-03-26 NOTE — Progress Notes (Signed)
[facility] for Infectious Disease    Date of Admission:  03/05/2022   Total days of antibiotics    ID: Julie Montgomery is a 86 y.o. female with  Principal Problem:   Ileus ([place]) Active Problems:   Goals of care, counseling/discussion   Dehydration   Protein calorie malnutrition (HCC)   Paroxysmal atrial fibrillation (HCC)   Recurrent Clostridioides difficile infection   C. difficile diarrhea   Recurrent colitis due to Clostridium difficile   Anemia of chronic disease   Protein-calorie malnutrition, severe   Abnormal x-ray of abdomen   Adynamic ileus ([place])   Peritonitis ([place])    Subjective: Still having some abd discomfort but improving. Starting to increase oral intake  Medications:   amiodarone  200 mg Oral BID   apixaban  2.5 mg Oral BID   Chlorhexidine Gluconate Cloth  6 each Topical Daily   feeding supplement  237 mL Oral TID BM   furosemide  40 mg Intravenous Daily   Gerhardt's butt cream   Topical BID   insulin aspart  0-9 Units Subcutaneous Q4H   leptospermum manuka honey  1 Application Topical Daily   lidocaine  1 patch Transdermal Q24H   memantine  5 mg Oral BID   polycarbophil  625 mg Oral Daily   vancomycin  125 mg Oral QID   Followed by   Derrill Memo ON 04/05/2022] vancomycin  125 mg Oral BID   Followed by   Derrill Memo ON 04/13/2022] vancomycin  125 mg Oral Daily   Followed by   Derrill Memo ON 04/20/2022] vancomycin  125 mg Oral QODAY   Followed by   Derrill Memo ON 04/28/2022] vancomycin  125 mg Oral Q3 days   Vitamin D (Ergocalciferol)  50,000 Units Oral Q7 days    Objective: Vital signs in last 24 hours: Temp:  [97.8 F (36.6 C)-98.9 F (37.2 C)] 98.2 F (36.8 C) (11/27 1657) Pulse Rate:  [65-71] 70 (11/27 1657) Resp:  [14-22] 18 (11/27 1657) BP: (108-125)/(50-61) 118/61 (11/27 1657) SpO2:  [97 %-100 %] 97 % (11/27 1657)  Physical Exam  Constitutional:  oriented to person, place, and time. appears well-developed and mal-nourished. No distress.  HENT:  [place]/AT, PERRLA, no scleral icterus, bitermporal wasting Mouth/Throat: Oropharynx is clear and moist. No oropharyngeal exudate.  Cardiovascular: Normal rate, regular rhythm and normal heart sounds. Exam reveals no gallop and no friction rub.  No murmur heard.  Pulmonary/Chest: Effort normal and breath sounds normal. No respiratory distress.  has no wheezes.  Neck = supple, no nuchal rigidity Abdominal: Soft. Bowel sounds are normal. Ostomy with liquid stool Lymphadenopathy: no cervical adenopathy. No axillary adenopathy Neurological: alert and oriented to person, place, and time.  Skin: Skin is warm and dry. No rash noted. No erythema.  Psychiatric: a normal mood and affect.  behavior is normal.    Lab Results Recent Labs    03/24/22 0350 03/25/22 0602 03/26/22 1422  WBC 18.2* 12.7* 17.1*  HGB 8.3* 8.2* 10.5*  HCT 25.4* 25.2* 32.0*  NA 145 138  --   K 3.9 4.2  --   CL 107 105  --   CO2 31 28  --   BUN 28* 27*  --   CREATININE 0.51 0.35*  --     Microbiology: reviewed Studies/Results: No results found.   Assessment/Plan: History of cdifficile, malnutrition, hx of steroid use now s/p Subtotal colectomy with end ileostomy for Pneumoperitoneum  - continue on cdifficile treatment with oral vancomycin for the time being -  will discuss taper as she continues to improve  Severe protein calorie malnutrition =as her oral intake improves, can wean off of TNA  Leukocytosis = trending down since surgery  [facility] for Infectious Diseases Pager: [phone]  03/26/2022, 6:02 PM

## 2022-03-26 NOTE — TOC Initial Note (Signed)
Transition of Care [facility]) - Initial/Assessment Note    Patient Details  Name: Julie Montgomery MRN: [id number] Date of Birth: [birth year]  Transition of Care [facility]) CM/SW Contact:    Pollie Friar, RN Phone Number: 03/26/2022, 12:08 PM  Clinical Narrative:                 CM met with the patient and her family at the bedside. Patient quiet during visit. Spouse confirms they have been set up with [facility] for [facility] but end up back in the hospital before [facility] can make a visit. Will need [place] orders entered at d/c and Alvis Lemmings will continue to attempt to see at home.  Outpatient palliative arranged through [place]. Information on the AVS.  Pt has walker/ BSC at home.  Spouse to provide transport home when medically ready.  TOC following.  Expected Discharge Plan: [place] Barriers to Discharge: Continued Medical Work up   Patient Goals and CMS Choice Patient states their goals for this hospitalization and ongoing recovery are:: to return home CMS Medicare.gov Compare Post Acute Care list provided to:: Patient Represenative (must comment) Choice offered to / list presented to : Spouse  Expected Discharge Plan and Services Expected Discharge Plan: [place] In-house Referral: NA Discharge Planning Services: CM Consult Post Acute Care Choice: Home Health, Resumption of Svcs/PTA Provider (palliative) Living arrangements for the past 2 months: Single Family Home                   DME Agency: NA       HH Arranged: PT, OT HH Agency: [place] Date [facility] Agency Contacted: 03/08/22 Time HH Agency Contacted: 100 Representative spoke with at [place]: Tommi Rumps  Prior Living Arrangements/Services Living arrangements for the past 2 months: [place] Lives with:: Spouse Patient language and need for interpreter reviewed:: Yes Do you feel safe going back to the place where you live?: Yes      Need for Family Participation in  Patient Care: Yes (Comment) Care giver support system in place?: Yes (comment)   Criminal Activity/Legal Involvement Pertinent to Current Situation/Hospitalization: No - Comment as needed  Activities of Daily Living      Permission Sought/Granted Permission sought to share information with : Family Supports, Customer service manager, Case Manager Permission granted to share information with : Yes, Verbal Permission Granted     Permission granted to share info w AGENCY: [facility]        Emotional Assessment Appearance:: Appears stated age Attitude/Demeanor/Rapport: Engaged Affect (typically observed): Appropriate Orientation: : Oriented to Self, Oriented to Place, Oriented to  Time, Oriented to Situation Alcohol / Substance Use: Not Applicable Psych Involvement: No (comment)  Admission diagnosis:  Ileus ([place]) [K56.7] Weakness [R53.1] Generalized abdominal pain [R10.84] Abdominal pain [R10.9] Patient Active Problem List   Diagnosis Date Noted   Peritonitis ([place]) 03/22/2022   Abnormal x-ray of abdomen 03/08/2022   Adynamic ileus ([place]) 03/08/2022   Protein-calorie malnutrition, severe 03/07/2022   Hypercalcemia 03/06/2022   Anemia of chronic disease 03/06/2022   Ileus ([place]) 03/06/2022   Abdominal pain 03/05/2022   Atrial fibrillation with RVR ([place]) 02/28/2022   Long term (current) use of anticoagulants 02/28/2022   History of TIA (transient ischemic attack) 02/28/2022   Aortic atherosclerosis ([place]) 02/28/2022   Recurrent colitis due to Clostridium difficile 02/22/2022   Infectious colitis    C. difficile diarrhea 02/07/2022   Dyspnea 01/26/2022  Hyponatremia 24/58/0998   Metabolic acidosis 33/82/5053   C. difficile colitis 01/11/2022   Paroxysmal atrial fibrillation ([place]) 12/19/2021   Recurrent Clostridioides difficile infection 12/19/2021   Paroxysmal atrial fibrillation with RVR (HCC) 12/14/2021   Protein calorie malnutrition ([place]) 12/13/2021   Dehydration  with hyponatremia 12/12/2021   Wound infection following procedure 11/29/2021   Breast lump 08/15/2021   History of hormone replacement therapy 08/15/2021   Hyperlipidemia 08/15/2021   Osteopenia 08/15/2021   Sensorineural hearing loss (SNHL) of both ears 08/15/2021   Underweight 03/31/2021   Statin myopathy 03/09/2021   Achilles tendon disorder, right 02/14/2021   Memory problem 01/19/2020   Ganglion cyst of left foot 01/11/2020   Mass of foot 12/14/2019   Dehydration 10/29/2019   Gastroenteritis 03/23/2019   IBS (irritable bowel syndrome) 01/19/2019   Osteoporosis 01/13/2019   Coronary artery calcification seen on computed tomography 06/11/2018   Sternoclavicular joint pain, right 06/12/2017   Left ulnar fracture 09/06/2016   Post-nasal drip 05/02/2016   Carotid bruit 02/17/2016   Stress at home 10/18/2015   Swelling of foot joint 10/14/2014   Clostridium difficile colitis 10/04/2014   Diarrhea 10/04/2014   Left hand weakness 08/10/2014   Numbness of left hand 04/19/2014   Lipoma of arm 01/23/2012   Goals of care, counseling/discussion 04/03/2011   Edema 02/13/2011   Shoulder pain, right 01/30/2011   Bronchiectasis ([place]) 01/23/2011   Vertigo 09/13/2010   Allergic rhinitis 09/13/2010   CONSTIPATION 03/22/2010   LUMBAGO 03/22/2010   URINARY FREQUENCY, CHRONIC 03/22/2010   Situational mixed anxiety and depressive disorder 02/25/2008   Cough 02/17/2008   Anxiety disorder 02/07/2007   INSOMNIA-SLEEP DISORDER-UNSPEC 02/07/2007   Weight loss 02/07/2007   Dyslipidemia 11/21/2006   OSTEOPENIA 11/21/2006   ALLERGY 11/21/2006   PCP:  Plotnikov, Evie Lacks, MD Pharmacy:   [id number] [place], [place] - [address]Phone: [phone]: [phone]    Social Determinants of Health (SDOH) Interventions    Readmission Risk Interventions    03/08/2022    3:28 PM 02/27/2022    12:23 PM 02/12/2022    9:29 AM  Readmission Risk Prevention Plan  Transportation Screening Complete Complete Complete  PCP or Specialist Appt within 3-5 Days  Complete Complete  HRI or HWall Complete Complete  Social Work Consult for [place]/Counseling  Complete Complete  Palliative Care Screening  Not Applicable Not Applicable  Medication Review (Press photographer Complete Complete Complete  HRI or Home Care Consult Complete    SW Recovery Care/Counseling Consult Complete    Palliative Care Screening Complete    [place]

## 2022-03-26 NOTE — Progress Notes (Signed)
PHARMACY - TOTAL PARENTERAL NUTRITION CONSULT NOTE  Indication:  prolonged hospitalization with severe malnutrition/anasarca  Patient Measurements: Height: '5\' 2"'$  (157.5 cm) Weight: 48.1 kg (106 lb 0.7 oz) IBW/kg (Calculated) : 50.1 TPN AdjBW (KG): 41.3 Body mass index is 19.4 kg/m. Usual Weight: 49.5 kg  Assessment:  [name] with malnutrition d/t multiple factors including dementia, recurrent C diff with multiple admissions and GI intolerance, and difficulty chewing with ill-fitting dentures. Pt had poor PO intake prior to admit and reports nut allergy and lactose intolerance. Pharmacy consulted for TPN in setting of malnutrition and inability to meet needs with PO intake.    Patient has reported nut allergy (peanuts) - low risk of cross reactivity with SMOFlipid ingredient. Upon asking for rxn/details, pt and pt spouse do not remember pt having a nut allergy.  Have been tolerating SMOFlipid.  Glucose / Insulin: A1c 5.4% - CBGs controlled <180 (range 101-149) Used 4 units moderate SSI in past 24 hrs S/p methylpred '20mg'$  11/17-11/21 for colitis Electrolytes: K: 4.2 s/p 82mq PO, Mag: 1.9, CoCa: 9.6, phos: 2.5, Na: 138 (was 144), all others WNL from 11/26 - no new labs 11/27 - pt difficult stick Goal K>/=4 and Mag >/=2 with hx afib Renal: SCr stable <1, BUN down stable 27  Hepatic: LFTs / Tbili / TG WNL, albumin 2.4 Intake / Output; MIVF: UOP 1 mL/kg/hr, ileostomy output 175 ml, LBM 11/25, Lasix 40 IV daily   GI Imaging: 11/6 CTAP: persistent colonic distention , cannot exclude toxic megacolon 11/9 Ab Xray: decrease in gas distention, improved ileus  11/10 Ab Xray: improvement in gaseous distention 11/12 Ab Xray: no bowel dilation to suggest obstruction/ileus 11/16 Flex sigmoidoscopy: colon bx, mod diverticulosis 1/17 Abd XR: no bowel obstruction GI Surgeries / Procedures:  11/22: Subtotal colectomy with end ileostomy  Central access: PICC placed 03/10/22 TPN start date:  03/10/2022  Nutritional Goals: RD Estimated Needs Total Energy Estimated Needs: 1400-1600 Total Protein Estimated Needs: 70-85g Total Fluid Estimated Needs: 1.4-1.6L  Current Nutrition:  11/11 TPN & NPO 11/16-11/21 dysphagia 3 diet 11/23 sips of clears (tolerated) >> CLD 11/24 advanced to FLD 11/26 advanced to soft 11/27 added nutritional supplements and decrease TPN to 1/2 rate  Plan:  Decrease concentrated TPN to 1/2 rate 25 mL/hr to provide 50% of needs  Electrolytes in TPN: (will leave the same since no labs today) Na 1256m/L, K 5041mL, Ca 3mE49m, Mg 15mE33m(Ca/Mg total= 18mEq25m Phos 30mmol10mmax Cl Add standard MVI and trace elements to TPN Continue sensitive SSI Q4H.  Monitor TPN labs daily until stable, then standard on Mon/Thurs F/u diet toleration for TPN wean - decrease to 1/2 rate 11/27.   Cathy PAlanda SlimD, FCCM ClCard[facility] Hospitalal Pharmacist Please see AMION for all Pharmacists' Contact Phone Numbers 03/26/2022, 10:28 AM

## 2022-03-26 NOTE — Progress Notes (Signed)
PROGRESS NOTE    Julie Montgomery  FMB:[id number] DOB: 24-Jul-1935 DOA: 03/05/2022 PCP: Cassandria Anger, MD    Brief Narrative:  86 year old with history of dementia, recurrent C. difficile infection, permanent A-fib on Eliquis, chronic hyponatremia who was admitted with several days of poor oral intake, diarrhea. 11/6 admitted for FTT - BMI 17 11/9 started TPN 11/16 flex sig> congested mucosa, diverticula. Path not consistent with microscopic colitis or IBD.   11/22 perforated> colon resection with diverting ileostomy -> In ICU post-op intubated, requiring sedation due to agitation. ID supect perf abd from toxic megacolon. Rx resume oral vanc and IV flagyl and post op zosyn  Extubated in PM   Transferred to medical floor 11/23. Marland Kitchen  A-fib with RVR and on amiodarone infusion along with heparin. 11/24, transferred to medical floor. Medically improving.  Remains very frail and debilitated.  Still on TPN.  Assessment & Plan:   Acute intestinal perforation secondary to C diff toxic megacolon: 11/22: Colectomy and diverting ileostomy.  Surgically improving.  On soft diet today.  Followed by ID, remains on oral Flagyl and oral vancomycin.   Continue Flagyl.  Prolonged oral vancomycin taper as prescribed. Completed IV antibiotic therapy. Remains on TPN to augment nutrition with plan to wean off next 24 hours.  Paroxysmal A-fib with RVR: Recurrent pauses. Rate controlled in sinus rhythm.  Was on amiodarone infusion in the ICU.  Currently on amiodarone 200 mg twice daily.  Will discuss with cardiology nearing discharge about discharge regimen. On Eliquis now.  Severe protein calorie malnutrition: TPN at goal.  Advancing diet.  Continue TPN until she is able to tolerate diet. Electrolyte replacement along with TPN protocol by pharmacy.   ICU acquired delirium: Resolved.  We will start patient on a small dose of oral pain medications to help with pain control and mobility.  Continue to  mobilize.  Can come off telemetry monitor to help with mobility. Straight cath if needed for urinary retention.  Hopefully she can walk to the bathroom with help.     DVT prophylaxis: apixaban (ELIQUIS) tablet 2.5 mg Start: 03/23/22 1130 SCDs Start: 03/05/22 2227 apixaban (ELIQUIS) tablet 2.5 mg   Code Status: Full code Family Communication: Son at the bedside. Disposition Plan: Status is: Inpatient Remains inpatient appropriate because: Severe critical illness, still on TPN.     Consultants:  Critical care General surgery Gastroenterology Infectious disease  Procedures:  Colectomy and ileostomy  Antimicrobials:  Vancomycin and Flagyl Zosyn 11/22--- 11/26.   Subjective:  Patient seen and examined.  Her son was at the bedside.  Denied any nausea vomiting.  She has not used her nausea medicine for last 48 hours.  Remains on TPN.  Patient tells me that she was able to eat small amount of food. Overnight events noted.  Patient was retaining urine after Foley was discontinued.  We will do straight cath as needed.   Objective: Vitals:   03/25/22 1933 03/25/22 2324 03/26/22 0321 03/26/22 0813  BP: (!) 108/54 (!) 110/54 (!) 125/59 125/60  Pulse: 65 67 71 65  Resp: 19 14 (!) 22 19  Temp: 98.5 F (36.9 C) 98.2 F (36.8 C) 98.9 F (37.2 C) 98.6 F (37 C)  TempSrc: Oral Oral Oral Oral  SpO2: 98% 98% 100% 97%  Weight:      Height:        Intake/Output Summary (Last 24 hours) at 03/26/2022 1146 Last data filed at 03/26/2022 0811 Gross per 24 hour  Intake --  Output  1975 ml  Net -1975 ml    Filed Weights   03/22/22 0500 03/23/22 0600 03/24/22 0328  Weight: 43.1 kg 43.7 kg 48.1 kg    Examination:  General exam: Appears calm and comfortable .  Frail and debilitated.  Cachectic.  On room air. Respiratory system: Clear to auscultation. Respiratory effort normal. Cardiovascular system: S1 & S2 heard, RRR.  No pedal edema. Gastrointestinal system: Mild tenderness  without rigidity or guarding.  Ileostomy with loose stool.  Midline surgical incision with some tenderness but looks clean and dry. Urine collected in her pure wick is tea colored. Central nervous system: Alert and oriented.  Awake and interactive.  No focal neurological deficits.    Data Reviewed: I have personally reviewed following labs and imaging studies  CBC: Recent Labs  Lab 03/20/22 0508 03/21/22 0635 03/21/22 0737 03/22/22 0557 03/23/22 0852 03/24/22 0350 03/25/22 0602  WBC 15.5*  --  41.5* 22.9* 26.2* 18.2* 12.7*  NEUTROABS 12.8*  --  38.6* 20.9*  --   --   --   HGB 9.6*   < > 9.8* 8.8* 9.1* 8.3* 8.2*  HCT 29.5*   < > 30.2* 27.9* 28.0* 25.4* 25.2*  MCV 93.4  --  94.4 96.5 94.9 92.4 92.0  PLT 465*  --  519* 384 375 336 314   < > = values in this interval not displayed.    Basic Metabolic Panel: Recent Labs  Lab 03/22/22 0557 03/22/22 2314 03/23/22 0852 03/23/22 1112 03/24/22 0350 03/25/22 0602  NA 143 145 147* 143 145 138  K 4.1 2.8* 3.5 2.3* 3.9 4.2  CL 109 108 109 106 107 105  CO2 '27 29 29 29 31 28  '$ GLUCOSE 176* 187* 540* 231* 139* 149*  BUN 31* 24* 24* 24* 28* 27*  CREATININE 0.42* 0.45 0.43* 0.45 0.51 0.35*  CALCIUM 8.6* 8.7* 8.6* 8.6* 9.2 8.3*  MG 2.0 2.0 2.5* 1.8 2.3 1.9  PHOS 2.8  --  5.0* 2.2* 3.1 2.5    GFR: Estimated Creatinine Clearance: 39.7 mL/min (A) (by C-G formula based on SCr of 0.35 mg/dL (L)). Liver Function Tests: Recent Labs  Lab 03/21/22 0737 03/22/22 0557 03/23/22 0852 03/23/22 1112  AST 37 '22 21 19  '$ ALT 62* 41 30 32  ALKPHOS 77 73 67 69  BILITOT 0.5 0.3 0.4 0.3  PROT 5.7* 5.3* 4.9* 5.4*  ALBUMIN 3.3* 2.8* 2.3* 2.4*    No results for input(s): "LIPASE", "AMYLASE" in the last 168 hours. No results for input(s): "AMMONIA" in the last 168 hours. Coagulation Profile: Recent Labs  Lab 03/21/22 0140  INR 1.1    Cardiac Enzymes: No results for input(s): "CKTOTAL", "CKMB", "CKMBINDEX", "TROPONINI" in the last 168  hours. BNP (last 3 results) Recent Labs    01/26/22 1447  PROBNP 68.0    HbA1C: No results for input(s): "HGBA1C" in the last 72 hours. CBG: Recent Labs  Lab 03/25/22 1939 03/25/22 2324 03/26/22 0321 03/26/22 0859 03/26/22 1141  GLUCAP 154* 94 140* 124* 154*    Lipid Profile: No results for input(s): "CHOL", "HDL", "LDLCALC", "TRIG", "CHOLHDL", "LDLDIRECT" in the last 72 hours.  Thyroid Function Tests: No results for input(s): "TSH", "T4TOTAL", "FREET4", "T3FREE", "THYROIDAB" in the last 72 hours. Anemia Panel: No results for input(s): "VITAMINB12", "FOLATE", "FERRITIN", "TIBC", "IRON", "RETICCTPCT" in the last 72 hours. Sepsis Labs: No results for input(s): "PROCALCITON", "LATICACIDVEN" in the last 168 hours.  No results found for this or any previous visit (from the past 240 hour(s)).  Radiology Studies: No results found.      Scheduled Meds:  amiodarone  200 mg Oral BID   apixaban  2.5 mg Oral BID   Chlorhexidine Gluconate Cloth  6 each Topical Daily   feeding supplement  237 mL Oral TID BM   furosemide  40 mg Intravenous Daily   Gerhardt's butt cream   Topical BID   insulin aspart  0-9 Units Subcutaneous Q4H   leptospermum manuka honey  1 Application Topical Daily   lidocaine  1 patch Transdermal Q24H   memantine  5 mg Oral BID   polycarbophil  625 mg Oral Daily   vancomycin  125 mg Oral QID   Followed by   Derrill Memo ON 04/05/2022] vancomycin  125 mg Oral BID   Followed by   Derrill Memo ON 04/13/2022] vancomycin  125 mg Oral Daily   Followed by   Derrill Memo ON 04/20/2022] vancomycin  125 mg Oral QODAY   Followed by   Derrill Memo ON 04/28/2022] vancomycin  125 mg Oral Q3 days   Vitamin D (Ergocalciferol)  50,000 Units Oral Q7 days   Continuous Infusions:  promethazine (PHENERGAN) injection (IM or IVPB) 200 mL/hr at 03/25/22 0600   TPN ADULT (ION) 50 mL/hr at 03/25/22 1752   TPN ADULT (ION)       LOS: 20 days    Time spent: 35 minutes    Barb Merino, MD [facility] Pager [phone]

## 2022-03-26 NOTE — Progress Notes (Signed)
Physical Therapy Treatment Patient Details Name: Julie Montgomery MRN: [id number] DOB: 1936/03/02 Today's Date: 03/26/2022   History of Present Illness Pt is an 86 y.o. female admitted 03/05/22 with BLE edema, ABd distension, poor PO intake. Pt with recurrent C-diff, colonic ileus, dehydration. Sigmoidoscopy 11/16 with findings of erythmatous and congested mucosa in sigmoid colon, diverticulosis. Afib with RVR 11/18. 11/22 Subtotal colectomy with end ileostomy for Pneumoperitoneum.  Of note, multiple recent admissions the past 3 months with c-diff. PMH includes CHF, PAF, anemia, osteopenia, TIA, chronic hyponatremia.    PT Comments    Patient anxious regarding mobilizing OOB, however family very supportive/encouraging and patient able to progress to ambulating 10 ft with min assist and close chair follow. Continues to need increased assist with bed mobility as pt sleeps in a lift chair at home. Required mod assist to come to stand and needs additional time with therapies prior to discharge home. If progress remains slow, may need to consider post-acute rehab, although unsure if family would agree to this.     Recommendations for follow up therapy are one component of a multi-disciplinary discharge planning process, led by the attending physician.  Recommendations may be updated based on patient status, additional functional criteria and insurance authorization.  Follow Up Recommendations  Home health PT (after continued work with acute PT)     Assistance Recommended at Discharge Frequent or constant Supervision/Assistance  Patient can return home with the following A little help with bathing/dressing/bathroom;Assistance with cooking/housework;Direct supervision/assist for medications management;Direct supervision/assist for financial management;Assist for transportation;Help with stairs or ramp for entrance;Assistance with feeding;A lot of help with walking and/or transfers   Equipment  Recommendations  None recommended by PT    Recommendations for Other Services       Precautions / Restrictions Precautions Precautions: Fall;Other (comment) Precaution Comments: diarrhea, ostomy, bil foot wounds Restrictions Weight Bearing Restrictions: No     Mobility  Bed Mobility Overal bed mobility: Needs Assistance Bed Mobility: Supine to Sit     Supine to sit: Max assist, HOB elevated     General bed mobility comments: pt sleeps in lift chair at baseline and transitioned pt from supine to sitting via bed pad under pt    Transfers Overall transfer level: Needs assistance Equipment used: Rolling walker (2 wheels) Transfers: Sit to/from Stand Sit to Stand: Mod assist, +2 physical assistance           General transfer comment: pt with posterior lean requiring incr assist to come forward over her feet    Ambulation/Gait Ambulation/Gait assistance: Min assist, +2 safety/equipment Gait Distance (Feet): 10 Feet Assistive device: Rolling walker (2 wheels) Gait Pattern/deviations: Step-through pattern, Decreased stride length, Trunk flexed, Narrow base of support Gait velocity: decreased     General Gait Details: pt anxious; followed with chair for safety and reassurance for pt; became more shaky towards end of walk   Stairs             Wheelchair Mobility    Modified Rankin (Stroke Patients Only)       Balance Overall balance assessment: Needs assistance Sitting-balance support: No upper extremity supported, Feet supported, Bilateral upper extremity supported Sitting balance-Leahy Scale: Fair Sitting balance - Comments: fair sitting balance with and without UE support   Standing balance support: Bilateral upper extremity supported Standing balance-Leahy Scale: Poor Standing balance comment: reliant on RW and physical assist for balance  Cognition Arousal/Alertness: Awake/alert Behavior During Therapy:  Anxious Overall Cognitive Status: History of cognitive impairments - at baseline Area of Impairment: Memory, Problem solving, Awareness, Following commands, Safety/judgement, Attention                   Current Attention Level: Sustained Memory: Decreased short-term memory Following Commands: Follows one step commands with increased time, Follows one step commands inconsistently Safety/Judgement: Decreased awareness of deficits, Decreased awareness of safety Awareness: Intellectual Problem Solving: Slow processing, Difficulty sequencing, Requires verbal cues, Decreased initiation General Comments: cognitive impairments at baseline, pt with STM challenges, decreased awareness of position in space        Exercises General Exercises - Lower Extremity Ankle Circles/Pumps: AROM, Both, 5 reps Long Arc Quad: AROM, Both, 10 reps    General Comments General comments (skin integrity, edema, etc.): Son and spouse present and very encouraging      Pertinent Vitals/Pain Pain Assessment Pain Assessment: Faces Faces Pain Scale: Hurts little more Pain Location: back Pain Descriptors / Indicators: Sore, Nagging Pain Intervention(s): Limited activity within patient's tolerance, Repositioned    Home Living                          Prior Function            PT Goals (current goals can now be found in the care plan section) Acute Rehab PT Goals Patient Stated Goal: to go home PT Goal Formulation: With patient/family Time For Goal Achievement: 04/06/22 Potential to Achieve Goals: Fair Progress towards PT goals: Progressing toward goals    Frequency    Min 3X/week      PT Plan Current plan remains appropriate    Co-evaluation PT/OT/SLP Co-Evaluation/Treatment: Yes Reason for Co-Treatment: Complexity of the patient's impairments (multi-system involvement);To address functional/ADL transfers PT goals addressed during session: Mobility/safety with  mobility;Balance;Proper use of DME        AM-PAC PT "6 Clicks" Mobility   Outcome Measure  Help needed turning from your back to your side while in a flat bed without using bedrails?: A Lot Help needed moving from lying on your back to sitting on the side of a flat bed without using bedrails?: Total Help needed moving to and from a bed to a chair (including a wheelchair)?: A Lot Help needed standing up from a chair using your arms (e.g., wheelchair or bedside chair)?: A Lot Help needed to walk in hospital room?: Total Help needed climbing 3-5 steps with a railing? : Total 6 Click Score: 9    End of Session Equipment Utilized During Treatment: Gait belt Activity Tolerance: Patient limited by fatigue Patient left: in chair;with call bell/phone within reach;with family/visitor present (no chair alarm box present for use on unit)   PT Visit Diagnosis: Unsteadiness on feet (R26.81);Muscle weakness (generalized) (M62.81);Difficulty in walking, not elsewhere classified (R26.2);Pain Pain - part of body:  (abdomen)     Time: 1610-9604 PT Time Calculation (min) (ACUTE ONLY): 24 min  Charges:  $Gait Training: 8-22 mins                      [place]  Office [phone]    Rexanne Mano 03/26/2022, 2:46 PM

## 2022-03-26 NOTE — Progress Notes (Signed)
5 Days Post-Op   Subjective/Chief Complaint: No further nausea.  Ate well yesterday per son.     Objective: Vital signs in last 24 hours: Temp:  [97 F (36.1 C)-98.9 F (37.2 C)] 98.6 F (37 C) (11/27 0813) Pulse Rate:  [65-73] 71 (11/27 0321) Resp:  [14-22] 22 (11/27 0321) BP: (108-125)/(54-59) 125/59 (11/27 0321) SpO2:  [98 %-100 %] 100 % (11/27 0321) Last BM Date : 03/24/22  Intake/Output from previous day: 11/26 0701 - 11/27 0700 In: -  Out: 1375 [Urine:1200; Stool:175] Intake/Output this shift: Total I/O In: -  Out: 600 [Urine:600]  Abd ileostomy pink with plenty of output, dressing dry with honeycomb in place, approp tender  Lab Results:  Recent Labs    03/24/22 0350 03/25/22 0602  WBC 18.2* 12.7*  HGB 8.3* 8.2*  HCT 25.4* 25.2*  PLT 336 314   BMET Recent Labs    03/24/22 0350 03/25/22 0602  NA 145 138  K 3.9 4.2  CL 107 105  CO2 31 28  GLUCOSE 139* 149*  BUN 28* 27*  CREATININE 0.51 0.35*  CALCIUM 9.2 8.3*   PT/INR No results for input(s): "LABPROT", "INR" in the last 72 hours. ABG No results for input(s): "PHART", "HCO3" in the last 72 hours.  Invalid input(s): "PCO2", "PO2"  Studies/Results: No results found.  Anti-infectives: Anti-infectives (From admission, onward)    Start     Dose/Rate Route Frequency Ordered Stop   05/17/22 1000  vancomycin (VANCOCIN) capsule 125 mg  Status:  Discontinued       See Hyperspace for full Linked Orders Report.   125 mg Oral Every 3 DAYS 03/22/22 0930 03/22/22 1235   04/28/22 1000  vancomycin (VANCOCIN) capsule 125 mg       See Hyperspace for full Linked Orders Report.   125 mg Oral Every 3 DAYS 03/23/22 1257 05/07/22 0959   04/20/22 1000  vancomycin (VANCOCIN) capsule 125 mg       See Hyperspace for full Linked Orders Report.   125 mg Oral Every other day 03/23/22 1257 04/28/22 0959   04/19/22 1000  vancomycin (VANCOCIN) capsule 125 mg  Status:  Discontinued       See Hyperspace for full Linked  Orders Report.   125 mg Oral Every other day 03/22/22 0930 03/22/22 1235   04/13/22 1000  vancomycin (VANCOCIN) capsule 125 mg       See Hyperspace for full Linked Orders Report.   125 mg Oral Daily 03/23/22 1257 04/20/22 0959   04/12/22 1000  vancomycin (VANCOCIN) capsule 125 mg  Status:  Discontinued       See Hyperspace for full Linked Orders Report.   125 mg Oral Daily 03/22/22 0930 03/22/22 1235   04/05/22 2200  vancomycin (VANCOCIN) capsule 125 mg       See Hyperspace for full Linked Orders Report.   125 mg Oral 2 times daily 03/23/22 1257 04/12/22 2159   04/05/22 1000  vancomycin (VANCOCIN) capsule 125 mg  Status:  Discontinued       See Hyperspace for full Linked Orders Report.   125 mg Oral 2 times daily 03/22/22 0930 03/22/22 1235   03/23/22 1400  metroNIDAZOLE (FLAGYL) tablet 500 mg  Status:  Discontinued        500 mg Oral Every 8 hours 03/23/22 1255 03/26/22 0918   03/23/22 1400  vancomycin (VANCOCIN) capsule 125 mg       See Hyperspace for full Linked Orders Report.   125 mg Oral 4 times  daily 03/23/22 1257 04/05/22 1359   03/22/22 1300  vancomycin (VANCOCIN) capsule 125 mg  Status:  Discontinued        125 mg Oral 4 times daily 03/22/22 0930 03/23/22 1257   03/22/22 1030  vancomycin (VANCOCIN) capsule 125 mg  Status:  Discontinued       See Hyperspace for full Linked Orders Report.   125 mg Oral 4 times daily 03/22/22 0930 03/22/22 1235   03/21/22 1415  metroNIDAZOLE (FLAGYL) IVPB 500 mg  Status:  Discontinued        500 mg 100 mL/hr over 60 Minutes Intravenous Every 8 hours 03/21/22 1350 03/23/22 1255   03/21/22 0100  piperacillin-tazobactam (ZOSYN) IVPB 3.375 g        3.375 g 12.5 mL/hr over 240 Minutes Intravenous Every 8 hours 03/21/22 0004 03/24/22 2159   03/16/22 0800  vancomycin (VANCOCIN) capsule 125 mg  Status:  Discontinued       See Hyperspace for full Linked Orders Report.   125 mg Oral Daily with breakfast 03/06/22 0220 03/06/22 1240   03/09/22 2200   vancomycin (VANCOCIN) capsule 125 mg       Note to Pharmacy: 02/27/22 - 03/08/22: Take 1 capsule four times daily; 11/10- 11/16: take 1 capsule two times daily; 11/17 - 11/23: take 1 capsule once daily; from 11/24 and on, see other prescrption sent [facility]     125 mg Oral 2 times daily 03/09/22 1635 03/14/22 2126   03/09/22 1730  azithromycin (ZITHROMAX) 1,000 mg in dextrose 5 % 250 mL IVPB        1,000 mg 260 mL/hr over 60 Minutes Intravenous Every 24 hours 03/09/22 1635 03/09/22 2024   03/09/22 0800  vancomycin (VANCOCIN) capsule 125 mg  Status:  Discontinued       See Hyperspace for full Linked Orders Report.   125 mg Oral 2 times daily with meals 03/06/22 0220 03/06/22 1240   03/06/22 1800  vancomycin (VANCOCIN) 500 mg in sodium chloride irrigation 0.9 % 100 mL ENEMA  Status:  Discontinued        500 mg Rectal Every 6 hours 03/06/22 1429 03/06/22 1528   03/06/22 1800  vancomycin (VANCOCIN) capsule 125 mg       Note to Pharmacy: 02/27/22 - 03/08/22: Take 1 capsule four times daily; 11/10- 11/16: take 1 capsule two times daily; 11/17 - 11/23: take 1 capsule once daily; from 11/24 and on, see other prescrption [facility]     125 mg Oral Every 6 hours 03/06/22 1528 03/09/22 1300   03/06/22 1300  metroNIDAZOLE (FLAGYL) IVPB 500 mg  Status:  Discontinued        500 mg 100 mL/hr over 60 Minutes Intravenous Every 8 hours 03/06/22 1249 03/08/22 0838   03/06/22 0800  vancomycin (VANCOCIN) capsule 125 mg  Status:  Discontinued       Note to Pharmacy: 02/27/22 - 03/08/22: Take 1 capsule four times daily; 11/10- 11/16: take 1 capsule two times daily; 11/17 - 11/23: take 1 capsule once daily; from 11/24 and on, see other prescrption [facility]     125 mg Oral 3 times daily before meals & bedtime 03/06/22 0216 03/06/22 1240       Assessment/Plan: POD 5,  s/p Subtotal colectomy with end ileostomy for Pneumoperitoneum by Dr. Thermon Leyland on 03/20/22 - soft diet, add Ensure - Cont abx - WOCN for new  ostomy - Path: A. COLON, SUBTOTAL COLECTOMY:  - Dilated colon with variably flattened intestinal  folds.  - Serosal adhesions.  - No perforation identified.  - No dysplasia or malignancy.  -wean TNA to half rate today as she is eating fairly well and adding Ensure today.   FEN - soft, TPN, 1/2 rate, Ensure VTE - Eliquis ID - Flagyl 11/7 - 11/9. Vanc 11/7 - 11/16. Zosyn 11/22 >> WBC 12.7 today Foley - out, but required I&O cath, per medicine   Dementia PAF on Eliquis at baseline Recurrent C. Diff - C. Diff was negative 11/10. Tx w/ Vanc as above per ID  GI panel + for E. Coli HFpEF    Julie Montgomery 03/26/2022

## 2022-03-27 ENCOUNTER — Other Ambulatory Visit (HOSPITAL_COMMUNITY): Payer: Self-pay

## 2022-03-27 DIAGNOSIS — K567 Ileus, unspecified: Secondary | ICD-10-CM | POA: Diagnosis not present

## 2022-03-27 LAB — BASIC METABOLIC PANEL
Anion gap: 7 (ref 5–15)
BUN: 27 mg/dL — ABNORMAL HIGH (ref 8–23)
CO2: 24 mmol/L (ref 22–32)
Calcium: 8.5 mg/dL — ABNORMAL LOW (ref 8.9–10.3)
Chloride: 105 mmol/L (ref 98–111)
Creatinine, Ser: 0.39 mg/dL — ABNORMAL LOW (ref 0.44–1.00)
GFR, Estimated: >60 mL/min (ref >60)
Glucose, Bld: 114 mg/dL — ABNORMAL HIGH (ref 70–99)
Potassium: 4.8 mmol/L (ref 3.5–5.1)
Sodium: 136 mmol/L (ref 135–145)

## 2022-03-27 LAB — GLUCOSE, CAPILLARY
Glucose-Capillary: 107 mg/dL — ABNORMAL HIGH (ref 70–99)
Glucose-Capillary: 113 mg/dL — ABNORMAL HIGH (ref 70–99)
Glucose-Capillary: 121 mg/dL — ABNORMAL HIGH (ref 70–99)
Glucose-Capillary: 136 mg/dL — ABNORMAL HIGH (ref 70–99)
Glucose-Capillary: 60 mg/dL — ABNORMAL LOW (ref 70–99)
Glucose-Capillary: 75 mg/dL (ref 70–99)

## 2022-03-27 LAB — PHOSPHORUS: Phosphorus: 2.8 mg/dL (ref 2.5–4.6)

## 2022-03-27 LAB — MAGNESIUM: Magnesium: 2 mg/dL (ref 1.7–2.4)

## 2022-03-27 MED ORDER — GLUCOSE 40 % PO GEL
1.0000 | ORAL | Status: AC
  Administered 2022-03-28: 31 g via ORAL
  Filled 2022-03-27: qty 1.21

## 2022-03-27 MED ORDER — AMIODARONE HCL 200 MG PO TABS
200.0000 mg | ORAL_TABLET | Freq: Every day | ORAL | Status: DC
  Administered 2022-03-28 – 2022-03-30 (×3): 200 mg via ORAL
  Filled 2022-03-27 (×3): qty 1

## 2022-03-27 MED ORDER — LOPERAMIDE HCL 2 MG PO CAPS
2.0000 mg | ORAL_CAPSULE | Freq: Two times a day (BID) | ORAL | Status: DC
  Administered 2022-03-27 – 2022-03-28 (×3): 2 mg via ORAL
  Filled 2022-03-27 (×3): qty 1

## 2022-03-27 MED ORDER — CALCIUM POLYCARBOPHIL 625 MG PO TABS
1250.0000 mg | ORAL_TABLET | Freq: Every day | ORAL | Status: DC
  Administered 2022-03-28 – 2022-03-30 (×3): 1250 mg via ORAL
  Filled 2022-03-27 (×3): qty 2

## 2022-03-27 NOTE — Progress Notes (Addendum)
Initial Nutrition Assessment  DOCUMENTATION CODES:  Severe malnutrition in context of chronic illness, Underweight  INTERVENTION:  -Continue regular diet as tolerated -Encourage small, frequent meals, snacks and supplements -Continue Ensure Enlive TID as tolerated (350kcal and 20g protein) -Encourage slow increase in fiber intake  NUTRITION DIAGNOSIS:  Severe Malnutrition related to chronic illness (recurrent c. diff) as evidenced by severe fat depletion, severe muscle depletion. -po intake improving  GOAL:  Patient will meet greater than or equal to 90% of their needs -progressing  MONITOR:  Diet advancement, Weight trends, Labs  REASON FOR ASSESSMENT:  Consult Calorie Count  ASSESSMENT:  Pt admitted with dehydration d/t diminished oral intake, found to have ileus. PMH significant for dementia, recurrent C. Diff requiring multiple prior admissions, paroxysmal afib, chronic hyponatremia, anemia of chronic disease.  Visited pt at bedside with husband and son present. TPN discontinued this AM, diet advanced to regular (from soft). Pt with high ileostomy output of 1982m yesterday. Pt is drinking ONS and fluids well but very little solid food. ONS could be contributing to high output given osmolality >800 and high sugar/fat content. Will continue for now given ONS is one of the few things pt is taking consistently. Discussed trying small, more frequent portions of food q 2hrs. Dicussed options to add to meal trays to save for between meals, also reviewed meal options. Encouraged slow increase in fiber intake from food. Discouraged raw vegetables, popcorn, nuts and seeds initially. Encouraged continued fluid intake to maintain hydration. Discouraged the use of straws and carbonated beverages to minimize gas. Pt and family verbalized understanding. No further questions at this time. Ileostomy MNT handout added to AVS.  Of note, pt initially reported a peanut allergy to previous RD, but has  since been denying. Family present in room and reports pt eats peanut butter. Pb and crackers left at bedside per request.  Labs: Vitamin C, Folate, B12 and Thiamine labs WNL. Zinc pending  Diet Order:   Diet Order             Diet regular Room service appropriate? Yes; Fluid consistency: Thin  Diet effective now                   EDUCATION NEEDS:  Education needs have been addressed  Skin:  Skin Assessment: Skin Integrity Issues: Skin Integrity Issues:: Incisions, Unstageable DTI: sacrum Incisions: abdomen Other: non-pressure wound bilateral feet  Last BM:  ileostomy  Height:  Ht Readings from Last 1 Encounters:  03/05/22 '5\' 2"'$  (1.575 m)    Weight:  Wt Readings from Last 1 Encounters:  03/24/22 48.1 kg    BMI:  Body mass index is 19.4 kg/m.  Estimated Nutritional Needs:  Kcal:  1400-1600 Protein:  70-85g Fluid:  1.4-1.6L  KCandise Bowens MS, RD, LDN, CNSC See AMiON for contact information

## 2022-03-27 NOTE — Progress Notes (Signed)
Physical Therapy Treatment Patient Details Name: Julie Montgomery MRN: [id number] DOB: [birth year] Today's Date: 03/27/2022   History of Present Illness Pt is an 86 y.o. female admitted 03/05/22 with BLE edema, ABd distension, poor PO intake. Pt with recurrent C-diff, colonic ileus, dehydration. Sigmoidoscopy 11/16 with findings of erythmatous and congested mucosa in sigmoid colon, diverticulosis. Afib with RVR 11/18. 11/22 Subtotal colectomy with end ileostomy for Pneumoperitoneum.  Of note, multiple recent admissions the past 3 months with c-diff. PMH includes CHF, PAF, anemia, osteopenia, TIA, chronic hyponatremia.    PT Comments    Patient required less assist with bed mobility and able to progress gait to 2 walks with longest being 40 ft with RW and close chair to follow. Patient remains very anxious and afraid of falling.    Recommendations for follow up therapy are one component of a multi-disciplinary discharge planning process, led by the attending physician.  Recommendations may be updated based on patient status, additional functional criteria and insurance authorization.  Follow Up Recommendations  Home health PT (after continued work with acute PT)     Assistance Recommended at Discharge Frequent or constant Supervision/Assistance  Patient can return home with the following A little help with bathing/dressing/bathroom;Assistance with cooking/housework;Direct supervision/assist for medications management;Direct supervision/assist for financial management;Assist for transportation;Help with stairs or ramp for entrance;Assistance with feeding;A lot of help with walking and/or transfers   Equipment Recommendations  None recommended by PT    Recommendations for Other Services       Precautions / Restrictions Precautions Precautions: Fall;Other (comment) Precaution Comments: ostomy, bil foot wounds Restrictions Weight Bearing Restrictions: No     Mobility  Bed  Mobility Overal bed mobility: Needs Assistance Bed Mobility: Rolling, Sidelying to Sit Rolling: Mod assist Sidelying to sit: Mod assist       General bed mobility comments: normally sleeps in a lift chair; did better with rolling and side to sit    Transfers Overall transfer level: Needs assistance Equipment used: Rolling walker (2 wheels) Transfers: Sit to/from Stand Sit to Stand: Mod assist, +2 physical assistance          Lateral/Scoot Transfers: Min guard General transfer comment: pt with posterior lean requiring incr assist to come forward over her feet; poor use of UEs to push to stand x1 rep (off bed); did much better pushing to stand from chair with armrests    Ambulation/Gait Ambulation/Gait assistance: Min assist, +2 safety/equipment Gait Distance (Feet): 20 Feet (seated rest; 40) Assistive device: Rolling walker (2 wheels) Gait Pattern/deviations: Step-through pattern, Decreased stride length, Trunk flexed, Narrow base of support Gait velocity: decreased Gait velocity interpretation: <1.8 ft/sec, indicate of risk for recurrent falls   General Gait Details: pt anxious but grateful to be up walking; followed with chair for safety and reassurance for pt;   Stairs             Wheelchair Mobility    Modified Rankin (Stroke Patients Only)       Balance Overall balance assessment: Needs assistance Sitting-balance support: No upper extremity supported, Feet supported, Bilateral upper extremity supported Sitting balance-Leahy Scale: Fair Sitting balance - Comments: fair sitting balance with and without UE support   Standing balance support: Bilateral upper extremity supported Standing balance-Leahy Scale: Poor Standing balance comment: reliant on RW and physical assist for balance                            Cognition Arousal/Alertness: Awake/alert Behavior  During Therapy: Anxious Overall Cognitive Status: History of cognitive impairments -  at baseline Area of Impairment: Memory, Problem solving, Awareness, Following commands, Safety/judgement, Attention                   Current Attention Level: Sustained Memory: Decreased short-term memory Following Commands: Follows one step commands with increased time, Follows one step commands inconsistently Safety/Judgement: Decreased awareness of deficits, Decreased awareness of safety Awareness: Intellectual Problem Solving: Slow processing, Difficulty sequencing, Requires verbal cues, Decreased initiation General Comments: cognitive impairments at baseline, pt with STM challenges (repeats herself);        Exercises      General Comments General comments (skin integrity, edema, etc.): Pt initially resistant, but ultimately agreed and then was grateful to have walked and get OOB.      Pertinent Vitals/Pain Pain Assessment Pain Assessment: Faces Faces Pain Scale: No hurt    Home Living                          Prior Function            PT Goals (current goals can now be found in the care plan section) Acute Rehab PT Goals Patient Stated Goal: to go home Time For Goal Achievement: 04/06/22 Potential to Achieve Goals: Fair Progress towards PT goals: Progressing toward goals    Frequency    Min 3X/week      PT Plan Current plan remains appropriate    Co-evaluation              AM-PAC PT "6 Clicks" Mobility   Outcome Measure  Help needed turning from your back to your side while in a flat bed without using bedrails?: A Lot Help needed moving from lying on your back to sitting on the side of a flat bed without using bedrails?: A Lot Help needed moving to and from a bed to a chair (including a wheelchair)?: Total Help needed standing up from a chair using your arms (e.g., wheelchair or bedside chair)?: Total Help needed to walk in hospital room?: Total Help needed climbing 3-5 steps with a railing? : Total 6 Click Score: 8    End of  Session Equipment Utilized During Treatment: Gait belt Activity Tolerance: Patient limited by fatigue Patient left: in chair;with call bell/phone within reach;with family/visitor present (no chair alarm box present for use on unit) Nurse Communication: Mobility status PT Visit Diagnosis: Unsteadiness on feet (R26.81);Muscle weakness (generalized) (M62.81);Difficulty in walking, not elsewhere classified (R26.2);Pain     Time: 8502-7741 PT Time Calculation (min) (ACUTE ONLY): 23 min  Charges:  $Gait Training: 23-37 mins                      [place]  Office [phone]    Rexanne Mano 03/27/2022, 2:07 PM

## 2022-03-27 NOTE — Progress Notes (Addendum)
[facility] for Infectious Disease  Date of Admission:  03/05/2022      Total days of antibiotics 8  Vancomyin QID 11/24 >> current  Zosyn x 4d          ASSESSMENT: Julie Montgomery is a 86 y.o. female with history of recurrent CDiff infections, malnutrition, recent steroid use now s/p subtotal colectomy with end ileostomy for pneumoperitoneum. Interestingly however her colon tissue pathology is not incredibly consistent with what you would see with severe c diff infection. Out of caution we are continuing cdiff treatment with oral vancomycin with plans to taper with definitive stop date. Her son asked today about ongoing pursuit of FMT - certainly lower GI administration would not likely be helpful given subtotal colectomy and limited colonic mucosa with removing affected colon; unclear if PO admin going forward would  Leukocytosis 12.7 >> 17.1K - continue to follow   Malnutrition - continues to work to increase PO intake and wean TPN.     PLAN: Continue current vancomycin QID (Day 4 of full treatment dose) followed by long taper as she heals Will review to see if anything in literature helps with further recs of oral FMT Asked path to re-read slides to see if c/w cdifficile infection Recommend to do testing for other noninfectious cause for high ileostomy output. Recommend to check for chromogranin A, 5HIAA, 24hr urine collection for serotonin. Will not recommend any oral vanco enema into the stump.  Leukocytosis- repeat cbc with diff tomorrow   I have personally spent 50 minutes involved in face-to-face and non-face-to-face activities for this patient on the day of the visit. Professional time spent includes the following activities: Preparing to see the patient (review of tests), Obtaining and/or reviewing separately obtained history (admission/discharge record), Performing a medically appropriate examination and/or evaluation , Ordering medications/tests/procedures,  referring and communicating with other health care professionals, Documenting clinical information in the EMR, Independently interpreting results (not separately reported), Communicating results to the patient/family/caregiver, Counseling and educating the patient/family/caregiver and Care coordination (not separately reported).    Elzie Rings Baxter Flattery MD [facility] [phone]   Principal Problem:   Ileus [facility]) Active Problems:   Goals of care, counseling/discussion   Dehydration   Protein calorie malnutrition (HCC)   Paroxysmal atrial fibrillation (HCC)   Recurrent Clostridioides difficile infection   C. difficile diarrhea   Recurrent colitis due to Clostridium difficile   Anemia of chronic disease   Protein-calorie malnutrition, severe   Abnormal x-ray of abdomen   Adynamic ileus (HCC)   Peritonitis (HCC)    amiodarone  200 mg Oral BID   apixaban  2.5 mg Oral BID   Chlorhexidine Gluconate Cloth  6 each Topical Daily   feeding supplement  237 mL Oral TID BM   furosemide  40 mg Intravenous Daily   Gerhardt's butt cream   Topical BID   insulin aspart  0-9 Units Subcutaneous Q4H   leptospermum manuka honey  1 Application Topical Daily   lidocaine  1 patch Transdermal Q24H   loperamide  2 mg Oral BID   memantine  5 mg Oral BID   [START ON 03/28/2022] polycarbophil  1,250 mg Oral Daily   vancomycin  125 mg Oral QID   Followed by   Derrill Memo ON 04/05/2022] vancomycin  125 mg Oral BID   Followed by   Derrill Memo ON 04/13/2022] vancomycin  125 mg Oral Daily   Followed by   Derrill Memo ON  04/20/2022] vancomycin  125 mg Oral QODAY   Followed by   Derrill Memo ON 04/28/2022] vancomycin  125 mg Oral Q3 days   Vitamin D (Ergocalciferol)  50,000 Units Oral Q7 days    SUBJECTIVE: Likes her Ensure and [employer].  Belly is pretty comfortable.    Review of Systems: Review of Systems  Constitutional:  Negative for chills and fever.  Respiratory: Negative.     Cardiovascular: Negative.   Gastrointestinal:  Positive for diarrhea (high ostomy output). Negative for abdominal pain, nausea and vomiting.  Genitourinary: Negative.   Musculoskeletal: Negative.     Allergies  Allergen Reactions   Lactose Intolerance (Gi) Other (See Comments)    Intolerance    Lovastatin Other (See Comments)    Unknown reaction   Mometasone Furo-Formoterol Fum Other (See Comments)    Loss of appetite, laryngitis    Peanut-Containing Drug Products Other (See Comments)   Prozac [Fluoxetine Hcl] Other (See Comments)    Jumpy   Sulfa Antibiotics Other (See Comments)    Unknown reaction    OBJECTIVE: Vitals:   03/26/22 1657 03/26/22 1942 03/27/22 0328 03/27/22 0832  BP: 118/61 (!) 150/129 108/61 (!) 104/48  Pulse: 70 69 (!) 59 66  Resp: '18 20 14   '$ Temp: 98.2 F (36.8 C) 97.9 F (36.6 C) 98.4 F (36.9 C)   TempSrc:      SpO2: 97% 95% 97% 99%  Weight:      Height:       Body mass index is 19.4 kg/m.   Physical Exam Vitals reviewed.  Constitutional:      Appearance: Normal appearance. She is not ill-appearing.  HENT:     Mouth/Throat:     Mouth: Mucous membranes are moist.     Pharynx: Oropharynx is clear.  Eyes:     General: No scleral icterus. Cardiovascular:     Rate and Rhythm: Normal rate.     Heart sounds: No murmur heard. Pulmonary:     Effort: Pulmonary effort is normal.  Abdominal:     General: Bowel sounds are normal. There is no distension.     Palpations: Abdomen is soft.  Neurological:     Mental Status: She is alert and oriented to person, place, and time.  Psychiatric:        Mood and Affect: Mood normal.        Thought Content: Thought content normal.     Lab Results Lab Results  Component Value Date   WBC 17.1 (H) 03/26/2022   HGB 10.5 (L) 03/26/2022   HCT 32.0 (L) 03/26/2022   MCV 92.5 03/26/2022   PLT 406 (H) 03/26/2022    Lab Results  Component Value Date   CREATININE 0.39 (L) 03/27/2022   BUN 27 (H)  03/27/2022   NA 136 03/27/2022   K 4.8 03/27/2022   CL 105 03/27/2022   CO2 24 03/27/2022    Lab Results  Component Value Date   ALT 32 03/23/2022   AST 19 03/23/2022   ALKPHOS 69 03/23/2022   BILITOT 0.3 03/23/2022     Microbiology: No results found for this or any previous visit (from the past 240 hour(s)).   Janene Madeira, MSN, NP-C [facility] Disease [email or web address] Pager: [phone] Office: [phone] [facility] Main Line: [place] Communication Welcome

## 2022-03-27 NOTE — Progress Notes (Signed)
     This pt has been referred to our [facility] program. This is a home-based Palliative care program that is provided by [facility]. We will follow the pt at home after discharge to assist with any chronic/acute symptom management needs. We utilize her PCP as the attending for this program. She will be getting nursing visits 1-3 times a month and SW support in the home with these services. She will also have after hours nursing support as well.    She is eligible for other [facility] services in home with our program in place as well.  Rilee Knoll RN [phone]

## 2022-03-27 NOTE — Progress Notes (Signed)
PROGRESS NOTE    Julie Montgomery  HGD:[id number] DOB: [birth year] DOA: 03/05/2022 PCP: Cassandria Anger, MD    Brief Narrative:  86 year old with history of dementia, recurrent C. difficile infection, permanent A-fib on Eliquis, chronic hyponatremia who was admitted with several days of poor oral intake, diarrhea. 11/6 admitted for FTT - BMI 17 11/9 started TPN 11/16 flex sig> congested mucosa, diverticula. Path not consistent with microscopic colitis or IBD.   11/22 perforated> colon resection with diverting ileostomy -> In ICU post-op intubated, requiring sedation due to agitation. ID supect perf abd from toxic megacolon. Rx resume oral vanc and IV flagyl and post op zosyn  Extubated in PM   Transferred to medical floor 11/23. Marland Kitchen  A-fib with RVR and on amiodarone infusion along with heparin. 11/24, transferred to medical floor. Medically improving.  Remains very frail and debilitated.  On TPN.  Assessment & Plan:   Acute intestinal perforation secondary to C diff toxic megacolon: 11/22: Colectomy and diverting ileostomy.  Surgically improving.  On regular diet and tolerating. Recurrent C. difficile infection.  Currently on Flagyl and oral vancomycin.  Completed physical therapy.  Planning for prolonged vancomycin therapy. Previously evaluated for fecal transplant. With improving oral intake, tapering off TPN today.  Paroxysmal A-fib with RVR:  Rate controlled in sinus rhythm.  Was on amiodarone infusion in the ICU.  Rate is controlled now.  Currently on amiodarone 200 mg twice daily, will change to once a day today.  Will discharge on maintenance amiodarone and have outpatient follow-up with cardiology.  Therapeutic on Eliquis.  Severe protein calorie malnutrition: Chronic and persistent problem.  Currently on TPN.  Tolerating regular diet.  Weaning off today. Electrolyte replacement along with TPN protocol by pharmacy.  ICU acquired delirium: Resolved.   Continue to mobilize.   Can come off telemetry monitor to help with mobility. Straight cath if needed for urinary retention.       DVT prophylaxis: apixaban (ELIQUIS) tablet 2.5 mg Start: 03/23/22 1130 SCDs Start: 03/05/22 2227 apixaban (ELIQUIS) tablet 2.5 mg   Code Status: Full code Family Communication: Sons at the bedside. Disposition Plan: Status is: Inpatient Remains inpatient appropriate because: Severe critical illness, diet tolerance.     Consultants:  Critical care General surgery Gastroenterology Infectious disease  Procedures:  Colectomy and ileostomy  Antimicrobials:  Vancomycin and Flagyl Zosyn 11/22--- 11/26.   Subjective:  Patient seen and examined.  2 sons at the bedside.  Patient initially said she is doing fine.  Patient complains of episodic cold and heat.  She has been asking her son to keep changing the thermostat in the room.  Afebrile.  Reportedly she ate a good breakfast and finished 75% or more of it.   Objective: Vitals:   03/26/22 1942 03/27/22 0328 03/27/22 0832 03/27/22 1207  BP: (!) 150/129 108/61 (!) 104/48 124/66  Pulse: 69 (!) 59 66 78  Resp: 20 14    Temp: 97.9 F (36.6 C) 98.4 F (36.9 C)  99.1 F (37.3 C)  TempSrc:    Oral  SpO2: 95% 97% 99% 97%  Weight:      Height:        Intake/Output Summary (Last 24 hours) at 03/27/2022 1251 Last data filed at 03/27/2022 1200 Gross per 24 hour  Intake 604 ml  Output 2877 ml  Net -2273 ml   Filed Weights   03/22/22 0500 03/23/22 0600 03/24/22 0328  Weight: 43.1 kg 43.7 kg 48.1 kg    Examination:  General exam:  Appears calm and comfortable .  Frail and debilitated.  Cachectic.  Respiratory system: Clear to auscultation. Respiratory effort normal. Cardiovascular system: S1 & S2 heard, RRR.  No pedal edema. Gastrointestinal system: Mild tenderness without rigidity or guarding.  Ileostomy with loose stool.  Midline surgical incision clean and dry.  Staples intact. Central nervous system: Alert and  oriented.  Awake and interactive.  No focal neurological deficits. Slightly impulsive today.    Data Reviewed: I have personally reviewed following labs and imaging studies  CBC: Recent Labs  Lab 03/21/22 0737 03/22/22 0557 03/23/22 0852 03/24/22 0350 03/25/22 0602 03/26/22 1422  WBC 41.5* 22.9* 26.2* 18.2* 12.7* 17.1*  NEUTROABS 38.6* 20.9*  --   --   --   --   HGB 9.8* 8.8* 9.1* 8.3* 8.2* 10.5*  HCT 30.2* 27.9* 28.0* 25.4* 25.2* 32.0*  MCV 94.4 96.5 94.9 92.4 92.0 92.5  PLT 519* 384 375 336 314 741*   Basic Metabolic Panel: Recent Labs  Lab 03/23/22 0852 03/23/22 1112 03/24/22 0350 03/25/22 0602 03/26/22 1422 03/27/22 0543  NA 147* 143 145 138  --  136  K 3.5 2.3* 3.9 4.2  --  4.8  CL 109 106 107 105  --  105  CO2 '29 29 31 28  '$ --  24  GLUCOSE 540* 231* 139* 149*  --  114*  BUN 24* 24* 28* 27*  --  27*  CREATININE 0.43* 0.45 0.51 0.35*  --  0.39*  CALCIUM 8.6* 8.6* 9.2 8.3*  --  8.5*  MG 2.5* 1.8 2.3 1.9 2.0 2.0  PHOS 5.0* 2.2* 3.1 2.5 2.9 2.8   GFR: Estimated Creatinine Clearance: 39.7 mL/min (A) (by C-G formula based on SCr of 0.39 mg/dL (L)). Liver Function Tests: Recent Labs  Lab 03/21/22 0737 03/22/22 0557 03/23/22 0852 03/23/22 1112  AST 37 '22 21 19  '$ ALT 62* 41 30 32  ALKPHOS 77 73 67 69  BILITOT 0.5 0.3 0.4 0.3  PROT 5.7* 5.3* 4.9* 5.4*  ALBUMIN 3.3* 2.8* 2.3* 2.4*   No results for input(s): "LIPASE", "AMYLASE" in the last 168 hours. No results for input(s): "AMMONIA" in the last 168 hours. Coagulation Profile: Recent Labs  Lab 03/21/22 0140  INR 1.1   Cardiac Enzymes: No results for input(s): "CKTOTAL", "CKMB", "CKMBINDEX", "TROPONINI" in the last 168 hours. BNP (last 3 results) Recent Labs    01/26/22 1447  PROBNP 68.0   HbA1C: No results for input(s): "HGBA1C" in the last 72 hours. CBG: Recent Labs  Lab 03/26/22 1948 03/26/22 2329 03/27/22 0327 03/27/22 0835 03/27/22 1201  GLUCAP 126* 127* 107* 113* 121*   Lipid  Profile: Recent Labs    03/26/22 1422  TRIG 53    Thyroid Function Tests: No results for input(s): "TSH", "T4TOTAL", "FREET4", "T3FREE", "THYROIDAB" in the last 72 hours. Anemia Panel: No results for input(s): "VITAMINB12", "FOLATE", "FERRITIN", "TIBC", "IRON", "RETICCTPCT" in the last 72 hours. Sepsis Labs: No results for input(s): "PROCALCITON", "LATICACIDVEN" in the last 168 hours.  No results found for this or any previous visit (from the past 240 hour(s)).       Radiology Studies: No results found.      Scheduled Meds:  amiodarone  200 mg Oral BID   apixaban  2.5 mg Oral BID   Chlorhexidine Gluconate Cloth  6 each Topical Daily   feeding supplement  237 mL Oral TID BM   furosemide  40 mg Intravenous Daily   Gerhardt's butt cream   Topical BID  insulin aspart  0-9 Units Subcutaneous Q4H   leptospermum manuka honey  1 Application Topical Daily   lidocaine  1 patch Transdermal Q24H   loperamide  2 mg Oral BID   memantine  5 mg Oral BID   [START ON 03/28/2022] polycarbophil  1,250 mg Oral Daily   vancomycin  125 mg Oral QID   Followed by   Derrill Memo ON 04/05/2022] vancomycin  125 mg Oral BID   Followed by   Derrill Memo ON 04/13/2022] vancomycin  125 mg Oral Daily   Followed by   Derrill Memo ON 04/20/2022] vancomycin  125 mg Oral QODAY   Followed by   Derrill Memo ON 04/28/2022] vancomycin  125 mg Oral Q3 days   Vitamin D (Ergocalciferol)  50,000 Units Oral Q7 days   Continuous Infusions:  promethazine (PHENERGAN) injection (IM or IVPB) 200 mL/hr at 03/25/22 0600   TPN ADULT (ION) 25 mL/hr at 03/26/22 1830     LOS: 21 days    Time spent: 35 minutes    Barb Merino, MD [facility] Pager [phone]

## 2022-03-27 NOTE — Progress Notes (Signed)
6 Days Post-Op   Subjective/Chief Complaint: No further nausea.  Eating slowly improving.  Drank and Ensure already today.  Ate all of her oatmeal, some eggs, drank her grape juice.     Objective: Vital signs in last 24 hours: Temp:  [97.8 F (36.6 C)-98.4 F (36.9 C)] 98.4 F (36.9 C) (11/28 0328) Pulse Rate:  [59-70] 66 (11/28 0832) Resp:  [14-20] 14 (11/28 0328) BP: (104-150)/(48-129) 104/48 (11/28 0832) SpO2:  [95 %-99 %] 99 % (11/28 0832) Last BM Date : 03/26/22  Intake/Output from previous day: 11/27 0701 - 11/28 0700 In: 964 [P.O.:360; I.V.:604] Out: 3127 [Urine:1151; Stool:1976] Intake/Output this shift: No intake/output data recorded.  Abd ileostomy pink with plenty of output, dressing dry with honeycomb in place, approp tender  Lab Results:  Recent Labs    03/25/22 0602 03/26/22 1422  WBC 12.7* 17.1*  HGB 8.2* 10.5*  HCT 25.2* 32.0*  PLT 314 406*   BMET Recent Labs    03/25/22 0602 03/27/22 0543  NA 138 136  K 4.2 4.8  CL 105 105  CO2 28 24  GLUCOSE 149* 114*  BUN 27* 27*  CREATININE 0.35* 0.39*  CALCIUM 8.3* 8.5*   PT/INR No results for input(s): "LABPROT", "INR" in the last 72 hours. ABG No results for input(s): "PHART", "HCO3" in the last 72 hours.  Invalid input(s): "PCO2", "PO2"  Studies/Results: No results found.  Anti-infectives: Anti-infectives (From admission, onward)    Start     Dose/Rate Route Frequency Ordered Stop   05/17/22 1000  vancomycin (VANCOCIN) capsule 125 mg  Status:  Discontinued       See Hyperspace for full Linked Orders Report.   125 mg Oral Every 3 DAYS 03/22/22 0930 03/22/22 1235   04/28/22 1000  vancomycin (VANCOCIN) capsule 125 mg       See Hyperspace for full Linked Orders Report.   125 mg Oral Every 3 DAYS 03/23/22 1257 05/07/22 0959   04/20/22 1000  vancomycin (VANCOCIN) capsule 125 mg       See Hyperspace for full Linked Orders Report.   125 mg Oral Every other day 03/23/22 1257 04/28/22 0959    04/19/22 1000  vancomycin (VANCOCIN) capsule 125 mg  Status:  Discontinued       See Hyperspace for full Linked Orders Report.   125 mg Oral Every other day 03/22/22 0930 03/22/22 1235   04/13/22 1000  vancomycin (VANCOCIN) capsule 125 mg       See Hyperspace for full Linked Orders Report.   125 mg Oral Daily 03/23/22 1257 04/20/22 0959   04/12/22 1000  vancomycin (VANCOCIN) capsule 125 mg  Status:  Discontinued       See Hyperspace for full Linked Orders Report.   125 mg Oral Daily 03/22/22 0930 03/22/22 1235   04/05/22 2200  vancomycin (VANCOCIN) capsule 125 mg       See Hyperspace for full Linked Orders Report.   125 mg Oral 2 times daily 03/23/22 1257 04/12/22 2159   04/05/22 1000  vancomycin (VANCOCIN) capsule 125 mg  Status:  Discontinued       See Hyperspace for full Linked Orders Report.   125 mg Oral 2 times daily 03/22/22 0930 03/22/22 1235   03/23/22 1400  metroNIDAZOLE (FLAGYL) tablet 500 mg  Status:  Discontinued        500 mg Oral Every 8 hours 03/23/22 1255 03/26/22 0918   03/23/22 1400  vancomycin (VANCOCIN) capsule 125 mg       See  Hyperspace for full Linked Orders Report.   125 mg Oral 4 times daily 03/23/22 1257 04/05/22 1359   03/22/22 1300  vancomycin (VANCOCIN) capsule 125 mg  Status:  Discontinued        125 mg Oral 4 times daily 03/22/22 0930 03/23/22 1257   03/22/22 1030  vancomycin (VANCOCIN) capsule 125 mg  Status:  Discontinued       See Hyperspace for full Linked Orders Report.   125 mg Oral 4 times daily 03/22/22 0930 03/22/22 1235   03/21/22 1415  metroNIDAZOLE (FLAGYL) IVPB 500 mg  Status:  Discontinued        500 mg 100 mL/hr over 60 Minutes Intravenous Every 8 hours 03/21/22 1350 03/23/22 1255   03/21/22 0100  piperacillin-tazobactam (ZOSYN) IVPB 3.375 g        3.375 g 12.5 mL/hr over 240 Minutes Intravenous Every 8 hours 03/21/22 0004 03/24/22 2159   03/16/22 0800  vancomycin (VANCOCIN) capsule 125 mg  Status:  Discontinued       See Hyperspace for  full Linked Orders Report.   125 mg Oral Daily with breakfast 03/06/22 0220 03/06/22 1240   03/09/22 2200  vancomycin (VANCOCIN) capsule 125 mg       Note to Pharmacy: 02/27/22 - 03/08/22: Take 1 capsule four times daily; 11/10- 11/16: take 1 capsule two times daily; 11/17 - 11/23: take 1 capsule once daily; from 11/24 and on, see other prescrption sent to [facility]     125 mg Oral 2 times daily 03/09/22 1635 03/14/22 2126   03/09/22 1730  azithromycin (ZITHROMAX) 1,000 mg in dextrose 5 % 250 mL IVPB        1,000 mg 260 mL/hr over 60 Minutes Intravenous Every 24 hours 03/09/22 1635 03/09/22 2024   03/09/22 0800  vancomycin (VANCOCIN) capsule 125 mg  Status:  Discontinued       See Hyperspace for full Linked Orders Report.   125 mg Oral 2 times daily with meals 03/06/22 0220 03/06/22 1240   03/06/22 1800  vancomycin (VANCOCIN) 500 mg in sodium chloride irrigation 0.9 % 100 mL ENEMA  Status:  Discontinued        500 mg Rectal Every 6 hours 03/06/22 1429 03/06/22 1528   03/06/22 1800  vancomycin (VANCOCIN) capsule 125 mg       Note to Pharmacy: 02/27/22 - 03/08/22: Take 1 capsule four times daily; 11/10- 11/16: take 1 capsule two times daily; 11/17 - 11/23: take 1 capsule once daily; from 11/24 and on, see other prescrption sent to [facility]     125 mg Oral Every 6 hours 03/06/22 1528 03/09/22 1300   03/06/22 1300  metroNIDAZOLE (FLAGYL) IVPB 500 mg  Status:  Discontinued        500 mg 100 mL/hr over 60 Minutes Intravenous Every 8 hours 03/06/22 1249 03/08/22 0838   03/06/22 0800  vancomycin (VANCOCIN) capsule 125 mg  Status:  Discontinued       Note to Pharmacy: 02/27/22 - 03/08/22: Take 1 capsule four times daily; 11/10- 11/16: take 1 capsule two times daily; 11/17 - 11/23: take 1 capsule once daily; from 11/24 and on, see other prescrption sent to [facility]     125 mg Oral 3 times daily before meals & bedtime 03/06/22 0216 03/06/22 1240       Assessment/Plan: POD 6,  s/p Subtotal colectomy with end ileostomy  for Pneumoperitoneum by Dr. Thermon Leyland on 03/20/22 - regular diet, add Ensure - Cont abx - WOCN for new ostomy -  Path: A. COLON, SUBTOTAL COLECTOMY:  - Dilated colon with variably flattened intestinal folds.  - Serosal adhesions.  - No perforation identified.  - No dysplasia or malignancy.  -wean TNA to off today.  It will take her weeks to months to regain a decent appetite given her prolonged decreases in nutrition.  She likes the Ensure and is willing to drink this and trying to eat what solid food she is able.  No further supplemental nutritional aid needed at this time -increase fiber and add low dose imodium due to ileostomy output of 1900cc yesterday   FEN - regular, TPN off today, Ensure VTE - Eliquis ID - Flagyl 11/7 - 11/9. Vanc 11/7 - 11/16. Zosyn 11/22 >> WBC 12.7 today Foley - out, but required I&O cath, per medicine   Dementia PAF on Eliquis at baseline Recurrent C. Diff - C. Diff was negative 11/10. Tx w/ Vanc as above per ID  GI panel + for E. Coli HFpEF    Julie Montgomery 03/27/2022

## 2022-03-28 DIAGNOSIS — K567 Ileus, unspecified: Secondary | ICD-10-CM | POA: Diagnosis not present

## 2022-03-28 LAB — CBC WITH DIFFERENTIAL/PLATELET
Abs Immature Granulocytes: 0.41 10*3/uL — ABNORMAL HIGH (ref 0.00–0.07)
Basophils Absolute: 0.1 10*3/uL (ref 0.0–0.1)
Basophils Relative: 1 %
Eosinophils Absolute: 0.6 10*3/uL — ABNORMAL HIGH (ref 0.0–0.5)
Eosinophils Relative: 3 %
HCT: 29.9 % — ABNORMAL LOW (ref 36.0–46.0)
Hemoglobin: 9.6 g/dL — ABNORMAL LOW (ref 12.0–15.0)
Immature Granulocytes: 2 %
Lymphocytes Relative: 8 %
Lymphs Abs: 1.5 10*3/uL (ref 0.7–4.0)
MCH: 29.6 pg (ref 26.0–34.0)
MCHC: 32.1 g/dL (ref 30.0–36.0)
MCV: 92.3 fL (ref 80.0–100.0)
Monocytes Absolute: 1 10*3/uL (ref 0.1–1.0)
Monocytes Relative: 5 %
Neutro Abs: 16.4 10*3/uL — ABNORMAL HIGH (ref 1.7–7.7)
Neutrophils Relative %: 81 %
Platelets: 455 10*3/uL — ABNORMAL HIGH (ref 150–400)
RBC: 3.24 MIL/uL — ABNORMAL LOW (ref 3.87–5.11)
RDW: 17.7 % — ABNORMAL HIGH (ref 11.5–15.5)
WBC: 20 10*3/uL — ABNORMAL HIGH (ref 4.0–10.5)
nRBC: 0 % (ref 0.0–0.2)

## 2022-03-28 LAB — GLUCOSE, CAPILLARY
Glucose-Capillary: 71 mg/dL (ref 70–99)
Glucose-Capillary: 75 mg/dL (ref 70–99)

## 2022-03-28 LAB — BASIC METABOLIC PANEL
Anion gap: 7 (ref 5–15)
BUN: 28 mg/dL — ABNORMAL HIGH (ref 8–23)
CO2: 26 mmol/L (ref 22–32)
Calcium: 8.5 mg/dL — ABNORMAL LOW (ref 8.9–10.3)
Chloride: 100 mmol/L (ref 98–111)
Creatinine, Ser: 0.41 mg/dL — ABNORMAL LOW (ref 0.44–1.00)
GFR, Estimated: >60 mL/min (ref >60)
Glucose, Bld: 88 mg/dL (ref 70–99)
Potassium: 4.5 mmol/L (ref 3.5–5.1)
Sodium: 133 mmol/L — ABNORMAL LOW (ref 135–145)

## 2022-03-28 MED ORDER — LOPERAMIDE HCL 2 MG PO CAPS
2.0000 mg | ORAL_CAPSULE | Freq: Every day | ORAL | Status: DC
  Administered 2022-03-29 – 2022-03-30 (×2): 2 mg via ORAL
  Filled 2022-03-28 (×2): qty 1

## 2022-03-28 MED ORDER — VANCOMYCIN HCL 125 MG PO CAPS
125.0000 mg | ORAL_CAPSULE | Freq: Two times a day (BID) | ORAL | Status: DC
  Administered 2022-03-28 – 2022-03-30 (×4): 125 mg via ORAL
  Filled 2022-03-28 (×5): qty 1

## 2022-03-28 NOTE — Progress Notes (Signed)
[facility] for Infectious Disease    Date of Admission:  03/05/2022   Total days of antibiotics 9          ID: Julie Montgomery is a 86 y.o. female with hx of cdifficile with severe malnutrition had colonic perforation concerning for cdiff infection Principal Problem:   Ileus ([place]) Active Problems:   Goals of care, counseling/discussion   Dehydration   Protein calorie malnutrition (HCC)   Paroxysmal atrial fibrillation (HCC)   Recurrent Clostridioides difficile infection   C. difficile diarrhea   Recurrent colitis due to Clostridium difficile   Anemia of chronic disease   Protein-calorie malnutrition, severe   Abnormal x-ray of abdomen   Adynamic ileus ([place])   Peritonitis ([place])    Subjective: Afebrile, patient has less ileostomy output. Sitting up feeling somewhat anxious.   Spoke with pathology who reviewed slides of colon resection and felt it was very benign. No symptoms that pointed toward cdifficile infection  Medications:   amiodarone  200 mg Oral Daily   apixaban  2.5 mg Oral BID   Chlorhexidine Gluconate Cloth  6 each Topical Daily   feeding supplement  237 mL Oral TID BM   Gerhardt's butt cream   Topical BID   leptospermum manuka honey  1 Application Topical Daily   lidocaine  1 patch Transdermal Q24H   [START ON 03/29/2022] loperamide  2 mg Oral Daily   memantine  5 mg Oral BID   polycarbophil  1,250 mg Oral Daily   vancomycin  125 mg Oral BID   Vitamin D (Ergocalciferol)  50,000 Units Oral Q7 days    Objective: Vital signs in last 24 hours: Temp:  [97.5 F (36.4 C)-97.9 F (36.6 C)] 97.5 F (36.4 C) (11/29 1153) Pulse Rate:  [65-81] 73 (11/29 1153) Resp:  [16-18] 16 (11/29 0838) BP: (87-120)/(54-67) 87/56 (11/29 1153) SpO2:  [94 %-100 %] 100 % (11/29 1153)  Physical Exam  Constitutional:  oriented to person, place, and time. appears well-developed and well-nourished. No distress.  HENT: [place]/AT, PERRLA, no scleral icterus Mouth/Throat:  Oropharynx is clear and moist. No oropharyngeal exudate.  Cardiovascular: Normal rate, regular rhythm and normal heart sounds. Exam reveals no gallop and no friction rub.  No murmur heard.  Pulmonary/Chest: Effort normal and breath sounds normal. No respiratory distress.  has no wheezes.  Neck = supple, no nuchal rigidity Abdominal: Soft. Bowel sounds are normal.  exhibits no distension. There is no tenderness. Ostomy in place Lymphadenopathy: no cervical adenopathy. No axillary adenopathy Neurological: alert and oriented to person, place, and time.  Skin: Skin is warm and dry. No rash noted. No erythema.  Psychiatric: a normal mood and affect.  behavior is normal.    Lab Results Recent Labs    03/26/22 1422 03/27/22 0543 03/28/22 0400 03/28/22 0930  WBC 17.1*  --   --  20.0*  HGB 10.5*  --   --  9.6*  HCT 32.0*  --   --  29.9*  NA  --  136 133*  --   K  --  4.8 4.5  --   CL  --  105 100  --   CO2  --  24 26  --   BUN  --  27* 28*  --   CREATININE  --  0.39* 0.41*  --     Microbiology:  Studies/Results: No results found.   Assessment/Plan: History of cdifficile = presentation of her colon perforation thought to be multifactorial due to  hypoalbunemia, inflammation/recovery from infection.  Recommend to decrease oral vancomycin dose to '125mg'$  BID to prophylaxis dosing for the next 14d   Ileostomy output = no longer high volume with loperamide  Leukocytosis = continue to monitor. No new signs of infection  Severe protein calorie malnutrition = continue with oral intake so that she that she can meet nutritional intake and no longer needs IV nutrition.  [facility] Pager: [phone]  03/28/2022, 12:35 PM

## 2022-03-28 NOTE — Progress Notes (Signed)
PROGRESS NOTE    Julie Montgomery  KGU:[id number] DOB: Sep 23, 1935 DOA: 03/05/2022 PCP: Cassandria Anger, MD    Brief Narrative:  86 year old with history of dementia, recurrent C. difficile infection, permanent A-fib on Eliquis, chronic hyponatremia who was admitted with several days of poor oral intake, diarrhea. 11/6 admitted for FTT - BMI 17 11/9 started TPN 11/16 flex sig> congested mucosa, diverticula. Path not consistent with microscopic colitis or IBD.   11/22 perforated> colon resection with diverting ileostomy -> In ICU post-op intubated, requiring sedation due to agitation. ID supect perf abd from toxic megacolon. Rx resume oral vanc and IV flagyl and post op zosyn  Extubated in PM   Transferred to medical floor 11/23. A-fib with RVR and on amiodarone infusion along with heparin. 11/24, transferred to medical floor. Medically improving.  Remains very frail and debilitated.  On TPN. 11/28 Off TPN, advancing diet as tolerated 11/29 Mild orthostatics - DC furosemide  Assessment & Plan:   Acute intestinal perforation secondary to C diff toxic megacolon: 11/22: Colectomy and diverting ileostomy.  Surgically improving.  On regular diet and tolerating. Recurrent C. difficile infection.  Currently on Flagyl and oral vancomycin.  Completed physical therapy.  Planning for prolonged vancomycin therapy. Previously evaluated for fecal transplant. With improving oral intake, tapering off TPN today.  Paroxysmal A-fib with RVR:  Rate controlled in sinus rhythm.  Was on amiodarone infusion in the ICU.  Rate is controlled now.  Currently on amiodarone 200 mg twice daily, will change to once a day today.  Will discharge on maintenance amiodarone and have outpatient follow-up with cardiology.  Therapeutic on Eliquis. DC furosemide.  Severe protein calorie malnutrition: Chronic and persistent problem.  Currently on TPN.  Tolerating regular diet.  Weaning off today. Electrolyte replacement  along with TPN protocol by pharmacy.  ICU acquired delirium: Resolved.   Ambulatory dysfunction, orthostatic hypotension Continue to mobilize.  Can come off telemetry monitor to help with mobility. Straight cath if needed for urinary retention.    DVT prophylaxis: apixaban (ELIQUIS) tablet 2.5 mg Start: 03/23/22 1130 SCDs Start: 03/05/22 2227 apixaban (ELIQUIS) tablet 2.5 mg   Code Status: Full code Family Communication: Son at bedside. Disposition Plan: Status is: Inpatient Remains inpatient appropriate because: Severe critical illness, diet tolerance.  Consultants:  Critical care General surgery Gastroenterology Infectious disease  Procedures:  Colectomy and ileostomy  Antimicrobials:  Vancomycin and Flagyl Zosyn 11/22--- 11/26.   Subjective: No acute issues or events overnight, somewhat orthostatic today with mobility but tolerating p.o. quite well.  Discussed possible disposition home with home health in the next few days if therapy continues to go well.   Objective: Vitals:   03/27/22 1554 03/27/22 1956 03/27/22 2344 03/28/22 0356  BP: (!) 116/54 120/67 103/64 111/60  Pulse: 78 81 71 65  Resp: '18 16 16 17  '$ Temp: 97.8 F (36.6 C) 97.8 F (36.6 C) 97.7 F (36.5 C) 97.6 F (36.4 C)  TempSrc: Oral Oral Oral Oral  SpO2: 95% 94% 97% 97%  Weight:      Height:        Intake/Output Summary (Last 24 hours) at 03/28/2022 0747 Last data filed at 03/27/2022 2100 Gross per 24 hour  Intake --  Output 1500 ml  Net -1500 ml    Filed Weights   03/22/22 0500 03/23/22 0600 03/24/22 0328  Weight: 43.1 kg 43.7 kg 48.1 kg    Examination:  General exam: Appears calm and comfortable .  Frail and debilitated.  Cachectic.  Respiratory system: Clear to auscultation. Respiratory effort normal. Cardiovascular system: S1 & S2 heard, RRR.  No pedal edema. Gastrointestinal system: Mild tenderness without rigidity or guarding.  Ileostomy noted.  Midline surgical incision  clean and dry. Central nervous system: Alert and oriented.  Awake and interactive.  No focal neurological deficits.  Asking repetitive questions today.  Data Reviewed: I have personally reviewed following labs and imaging studies  CBC: Recent Labs  Lab 03/22/22 0557 03/23/22 0852 03/24/22 0350 03/25/22 0602 03/26/22 1422  WBC 22.9* 26.2* 18.2* 12.7* 17.1*  NEUTROABS 20.9*  --   --   --   --   HGB 8.8* 9.1* 8.3* 8.2* 10.5*  HCT 27.9* 28.0* 25.4* 25.2* 32.0*  MCV 96.5 94.9 92.4 92.0 92.5  PLT 384 375 336 314 406*    Basic Metabolic Panel: Recent Labs  Lab 03/23/22 1112 03/24/22 0350 03/25/22 0602 03/26/22 1422 03/27/22 0543 03/28/22 0400  NA 143 145 138  --  136 133*  K 2.3* 3.9 4.2  --  4.8 4.5  CL 106 107 105  --  105 100  CO2 '29 31 28  '$ --  24 26  GLUCOSE 231* 139* 149*  --  114* 88  BUN 24* 28* 27*  --  27* 28*  CREATININE 0.45 0.51 0.35*  --  0.39* 0.41*  CALCIUM 8.6* 9.2 8.3*  --  8.5* 8.5*  MG 1.8 2.3 1.9 2.0 2.0  --   PHOS 2.2* 3.1 2.5 2.9 2.8  --    Liver Function Tests: Recent Labs  Lab 03/22/22 0557 03/23/22 0852 03/23/22 1112  AST '22 21 19  '$ ALT 41 30 32  ALKPHOS 73 67 69  BILITOT 0.3 0.4 0.3  PROT 5.3* 4.9* 5.4*  ALBUMIN 2.8* 2.3* 2.4*    CBG: Recent Labs  Lab 03/27/22 1650 03/27/22 1959 03/27/22 2346 03/28/22 0024 03/28/22 0359  GLUCAP 75 136* 60* 71 75    Lipid Profile: Recent Labs    03/26/22 1422  TRIG 59    Radiology Studies: No results found.  Scheduled Meds:  amiodarone  200 mg Oral Daily   apixaban  2.5 mg Oral BID   Chlorhexidine Gluconate Cloth  6 each Topical Daily   feeding supplement  237 mL Oral TID BM   furosemide  40 mg Intravenous Daily   Gerhardt's butt cream   Topical BID   insulin aspart  0-9 Units Subcutaneous Q4H   leptospermum manuka honey  1 Application Topical Daily   lidocaine  1 patch Transdermal Q24H   loperamide  2 mg Oral BID   memantine  5 mg Oral BID   polycarbophil  1,250 mg Oral Daily    vancomycin  125 mg Oral QID   Followed by   Derrill Memo ON 04/05/2022] vancomycin  125 mg Oral BID   Followed by   Derrill Memo ON 04/13/2022] vancomycin  125 mg Oral Daily   Followed by   Derrill Memo ON 04/20/2022] vancomycin  125 mg Oral QODAY   Followed by   Derrill Memo ON 04/28/2022] vancomycin  125 mg Oral Q3 days   Vitamin D (Ergocalciferol)  50,000 Units Oral Q7 days   Continuous Infusions:  promethazine (PHENERGAN) injection (IM or IVPB) 200 mL/hr at 03/25/22 0600    LOS: 22 days   Time spent: 35 minutes  Little Ishikawa, DO [facility] Pager [phone]

## 2022-03-28 NOTE — Consult Note (Signed)
[place] Nurse ostomy consult note Stoma type/location: RLQ end  ileostomy with midline abdominal staple line. Patient is up to chair. Spouse and sone at bedside.  Plan is to discharge home with spouse when stable.  She is sipping a protein drink when I arrive.   Stomal assessment/size: 1 1/4" round, budded and pale pink.  Minimal amount liquid brown stool in pouch.   Peristomal assessment: intact  midline staple line.  Honeycomb dressing is removed as ostomy pouch overlaps dressing.  Staple line is clean, dry and intact.  Treatment options for stomal/peristomal skin: barrier ring and 2 piece 2 1/4" pouch Output liquid brown stool. Output has slowed with  loperamide.  Ostomy pouching: 2pc. 2 1/4" pouch with barrier ring.  4 additional pouch sets in the room.   Education provided: Pouch change performed.  Patient is weak and nauseous and minimally participative.  Her spouse is attentive and asks questions.  He will be assisting at home and states they will have paid caregivers at times as well. We remove old pouch and cleanse skin and stoma. I discuss rationale for barrier ring and 2 piece pouch.  Emptying when 1/3 full and twice weekly pouch changes.  I demonstrate emptying, measuring stoma and cutting barrier to fit. Spouse connects barrier and pouch and is able to open and roll pouch closed.   Enrolled patient in [place] program: Yes will today.  Will follow.  Estrellita Ludwig MSN, RN, FNP-BC CWON Wound, Ostomy, Continence Nurse [facility] [phone] Pager [phone]

## 2022-03-28 NOTE — Progress Notes (Signed)
Mobility Specialist: Progress Note   03/28/22 1626  Mobility  Activity Transferred from chair to bed  Level of Assistance Moderate assist, patient does 50-74%  Assistive Device Other (Comment) (HHA)  Distance Ambulated (ft) 6 ft  Activity Response Tolerated well  Mobility Referral Yes  $Mobility charge 1 Mobility   Pt yelling out in the hallway secondary to back pain and requesting to get back to bed. Required modA HHA to stand and transfer back to bed. Verbal cues for upright posture. Pt is back in bed with call bell and phone in reach. Bed alarm is on.   [place] Tilia Faso Mobility Specialist Please contact via SecureChat or Rehab office at [phone]

## 2022-03-28 NOTE — Progress Notes (Signed)
[facility] for Infectious Disease  Date of Admission:  03/05/2022      Total days of antibiotics 9  Vancomycin 11/24 >> c  Zosyn x 4d             ASSESSMENT: Julie Montgomery is a 86 y.o. female with history of recurrent CDiff infections over the last few years, malnutrition in the setting of chronic large volume watery diarrhea/nausea/poor appetite, recent steroid use now s/p subtotal colectomy with end ileostomy for pneumoperitoneum. There has been conflicting information regarding was this event due to active CDiff infection.  Dr. Baxter Flattery reviewed tissue slides with GI pathologist and confirmed that there is no evidence of c diff playing a role based on resected colon tissue.  Will stop enteric precautions and reduce PO vancomycin to prophylactic  PO dose 125 mg BID. She is high risk for recurrence given intra-op abx needed for perforation; would complete 2 weeks (day 5 today).   Increased ileostomy output - improved with addition of BID loperamide; CCS reduced back to once daily. ? Non-infectious etiology. ?GI to see her back since no evidence of infection contributing.    Leukocytosis - upward trending a bit. No level collected today - will repeat.    PLAN: Stop enteric precautions with negative path (verified with GI pathologist) and CDiff testing Reduce PO vancomycin to 125 mg BID to continue through 2 weeks (day 5 today) for prophylaxis in patient with high risk of recurrence.  FU pending CBC for WBC trend   Principal Problem:   Ileus ([place]) Active Problems:   Goals of care, counseling/discussion   Dehydration   Protein calorie malnutrition (HCC)   Paroxysmal atrial fibrillation (HCC)   Recurrent Clostridioides difficile infection   C. difficile diarrhea   Recurrent colitis due to Clostridium difficile   Anemia of chronic disease   Protein-calorie malnutrition, severe   Abnormal x-ray of abdomen   Adynamic ileus (HCC)   Peritonitis (HCC)     amiodarone  200 mg Oral Daily   apixaban  2.5 mg Oral BID   Chlorhexidine Gluconate Cloth  6 each Topical Daily   feeding supplement  237 mL Oral TID BM   Gerhardt's butt cream   Topical BID   leptospermum manuka honey  1 Application Topical Daily   lidocaine  1 patch Transdermal Q24H   [START ON 03/29/2022] loperamide  2 mg Oral Daily   memantine  5 mg Oral BID   polycarbophil  1,250 mg Oral Daily   vancomycin  125 mg Oral BID   Vitamin D (Ergocalciferol)  50,000 Units Oral Q7 days    SUBJECTIVE: Anxious. Having a hard time getting her breath with movement to chair.    Review of Systems: Review of Systems  Constitutional:  Positive for malaise/fatigue and weight loss. Negative for chills and fever.  HENT:  Negative for sore throat.   Respiratory:  Positive for cough and sputum production. Negative for shortness of breath.   Cardiovascular: Negative.   Gastrointestinal:  Negative for abdominal pain, diarrhea and vomiting.  Musculoskeletal:  Negative for joint pain, myalgias and neck pain.  Skin:  Negative for rash.  Neurological:  Negative for headaches.  Psychiatric/Behavioral:  Negative for depression and substance abuse. The patient is not nervous/anxious.     Allergies  Allergen Reactions   Lactose Intolerance (Gi) Other (See Comments)    Intolerance    Lovastatin Other (See Comments)    Unknown reaction   Mometasone  Furo-Formoterol Fum Other (See Comments)    Loss of appetite, laryngitis    Peanut-Containing Drug Products Other (See Comments)   Prozac [Fluoxetine Hcl] Other (See Comments)    Jumpy   Sulfa Antibiotics Other (See Comments)    Unknown reaction    OBJECTIVE: Vitals:   03/27/22 2344 03/28/22 0356 03/28/22 0838 03/28/22 1153  BP: 103/64 111/60 101/67 (!) 87/56  Pulse: 71 65 80 73  Resp: '16 17 16   '$ Temp: 97.7 F (36.5 C) 97.6 F (36.4 C) 97.9 F (36.6 C) (!) 97.5 F (36.4 C)  TempSrc: Oral Oral Oral Oral  SpO2: 97% 97% 97% 100%  Weight:       Height:       Body mass index is 19.4 kg/m.  Physical Exam Vitals reviewed.  Constitutional:      Appearance: She is well-developed.     Comments: Seated comfortably in chair.   HENT:     Mouth/Throat:     Mouth: No oral lesions.     Dentition: Normal dentition. No dental abscesses.     Pharynx: No oropharyngeal exudate.  Cardiovascular:     Rate and Rhythm: Normal rate and regular rhythm.     Heart sounds: Normal heart sounds.  Pulmonary:     Effort: Pulmonary effort is normal.     Breath sounds: Normal breath sounds.     Comments: Some thicker sputum. Chest sounds clear in lower fields.  POx 100% on room air. HR 84 Abdominal:     General: There is no distension.     Palpations: Abdomen is soft.     Tenderness: There is no abdominal tenderness.  Lymphadenopathy:     Cervical: No cervical adenopathy.  Skin:    General: Skin is warm and dry.     Findings: No rash.  Neurological:     Mental Status: She is alert and oriented to person, place, and time.  Psychiatric:        Judgment: Judgment normal.     Lab Results Lab Results  Component Value Date   WBC 20.0 (H) 03/28/2022   HGB 9.6 (L) 03/28/2022   HCT 29.9 (L) 03/28/2022   MCV 92.3 03/28/2022   PLT 455 (H) 03/28/2022    Lab Results  Component Value Date   CREATININE 0.41 (L) 03/28/2022   BUN 28 (H) 03/28/2022   NA 133 (L) 03/28/2022   K 4.5 03/28/2022   CL 100 03/28/2022   CO2 26 03/28/2022    Lab Results  Component Value Date   ALT 32 03/23/2022   AST 19 03/23/2022   ALKPHOS 69 03/23/2022   BILITOT 0.3 03/23/2022     Microbiology: No results found for this or any previous visit (from the past 240 hour(s)).   Janene Madeira, MSN, NP-C [facility] Infectious Disease [email or web address] Pager: [phone] Office: [phone] [facility] Main Line: [place] Communication Welcome

## 2022-03-28 NOTE — Progress Notes (Signed)
Mobility Specialist: Progress Note   03/28/22 1133  Mobility  Activity Ambulated with assistance in hallway  Level of Assistance +2 (takes two people) (Chair follow for safety)  Assistive Device Front wheel walker  Distance Ambulated (ft) 100 ft (50'x2)  Activity Response Tolerated fair  Mobility Referral Yes  $Mobility charge 1 Mobility   Pre-Mobility: 84 HR, 100% SpO2 Post-Mobility: 87 HR, 86/53 (64) BP, 99% SpO2  Pt received in the chair, anxious but agreeable to mobility with encouragement from pt's son. MinA to stand and contact guard during session for balance. Stopped x1 for seated break secondary to mild dizziness and SOB. No c/o pain throughout. Pt to the chair after session with call bell in reach and family present in the room. RN present for low BP.   [place] Chae Oommen Mobility Specialist Please contact via SecureChat or Rehab office at [phone]

## 2022-03-28 NOTE — Progress Notes (Signed)
7 Days Post-Op   Subjective/Chief Complaint: Eating better.  A little repetitive in questions this morning.  Seen with primary team this morning.  Ileostomy output has thickened up quite a bit since yesterday with only 500cc/24hrs.   Objective: Vital signs in last 24 hours: Temp:  [97.6 F (36.4 C)-99.1 F (37.3 C)] 97.9 F (36.6 C) (11/29 0838) Pulse Rate:  [65-81] 80 (11/29 0838) Resp:  [16-18] 16 (11/29 0838) BP: (101-124)/(54-67) 101/67 (11/29 0838) SpO2:  [94 %-97 %] 97 % (11/29 0838) Last BM Date : 03/27/22  Intake/Output from previous day: 11/28 0701 - 11/29 0700 In: -  Out: 1500 [Urine:1000; Stool:500] Intake/Output this shift: Total I/O In: -  Out: 150 [Stool:150]  Abd ileostomy pink with thicker semi-solid output, dressing dry with honeycomb in place, approp tender, staples c/d/i  Lab Results:  Recent Labs    03/26/22 1422  WBC 17.1*  HGB 10.5*  HCT 32.0*  PLT 406*   BMET Recent Labs    03/27/22 0543 03/28/22 0400  NA 136 133*  K 4.8 4.5  CL 105 100  CO2 24 26  GLUCOSE 114* 88  BUN 27* 28*  CREATININE 0.39* 0.41*  CALCIUM 8.5* 8.5*   PT/INR No results for input(s): "LABPROT", "INR" in the last 72 hours. ABG No results for input(s): "PHART", "HCO3" in the last 72 hours.  Invalid input(s): "PCO2", "PO2"  Studies/Results: No results found.  Anti-infectives: Anti-infectives (From admission, onward)    Start     Dose/Rate Route Frequency Ordered Stop   05/17/22 1000  vancomycin (VANCOCIN) capsule 125 mg  Status:  Discontinued       See Hyperspace for full Linked Orders Report.   125 mg Oral Every 3 DAYS 03/22/22 0930 03/22/22 1235   04/28/22 1000  vancomycin (VANCOCIN) capsule 125 mg       See Hyperspace for full Linked Orders Report.   125 mg Oral Every 3 DAYS 03/23/22 1257 05/07/22 0959   04/20/22 1000  vancomycin (VANCOCIN) capsule 125 mg       See Hyperspace for full Linked Orders Report.   125 mg Oral Every other day 03/23/22 1257  04/28/22 0959   04/19/22 1000  vancomycin (VANCOCIN) capsule 125 mg  Status:  Discontinued       See Hyperspace for full Linked Orders Report.   125 mg Oral Every other day 03/22/22 0930 03/22/22 1235   04/13/22 1000  vancomycin (VANCOCIN) capsule 125 mg       See Hyperspace for full Linked Orders Report.   125 mg Oral Daily 03/23/22 1257 04/20/22 0959   04/12/22 1000  vancomycin (VANCOCIN) capsule 125 mg  Status:  Discontinued       See Hyperspace for full Linked Orders Report.   125 mg Oral Daily 03/22/22 0930 03/22/22 1235   04/05/22 2200  vancomycin (VANCOCIN) capsule 125 mg       See Hyperspace for full Linked Orders Report.   125 mg Oral 2 times daily 03/23/22 1257 04/12/22 2159   04/05/22 1000  vancomycin (VANCOCIN) capsule 125 mg  Status:  Discontinued       See Hyperspace for full Linked Orders Report.   125 mg Oral 2 times daily 03/22/22 0930 03/22/22 1235   03/23/22 1400  metroNIDAZOLE (FLAGYL) tablet 500 mg  Status:  Discontinued        500 mg Oral Every 8 hours 03/23/22 1255 03/26/22 0918   03/23/22 1400  vancomycin (VANCOCIN) capsule 125 mg  See Hyperspace for full Linked Orders Report.   125 mg Oral 4 times daily 03/23/22 1257 04/05/22 1359   03/22/22 1300  vancomycin (VANCOCIN) capsule 125 mg  Status:  Discontinued        125 mg Oral 4 times daily 03/22/22 0930 03/23/22 1257   03/22/22 1030  vancomycin (VANCOCIN) capsule 125 mg  Status:  Discontinued       See Hyperspace for full Linked Orders Report.   125 mg Oral 4 times daily 03/22/22 0930 03/22/22 1235   03/21/22 1415  metroNIDAZOLE (FLAGYL) IVPB 500 mg  Status:  Discontinued        500 mg 100 mL/hr over 60 Minutes Intravenous Every 8 hours 03/21/22 1350 03/23/22 1255   03/21/22 0100  piperacillin-tazobactam (ZOSYN) IVPB 3.375 g        3.375 g 12.5 mL/hr over 240 Minutes Intravenous Every 8 hours 03/21/22 0004 03/24/22 2159   03/16/22 0800  vancomycin (VANCOCIN) capsule 125 mg  Status:  Discontinued       See  Hyperspace for full Linked Orders Report.   125 mg Oral Daily with breakfast 03/06/22 0220 03/06/22 1240   03/09/22 2200  vancomycin (VANCOCIN) capsule 125 mg       Note to Pharmacy: 02/27/22 - 03/08/22: Take 1 capsule four times daily; 11/10- 11/16: take 1 capsule two times daily; 11/17 - 11/23: take 1 capsule once daily; from 11/24 and on, see other prescrption sent to [facility]     125 mg Oral 2 times daily 03/09/22 1635 03/14/22 2126   03/09/22 1730  azithromycin (ZITHROMAX) 1,000 mg in dextrose 5 % 250 mL IVPB        1,000 mg 260 mL/hr over 60 Minutes Intravenous Every 24 hours 03/09/22 1635 03/09/22 2024   03/09/22 0800  vancomycin (VANCOCIN) capsule 125 mg  Status:  Discontinued       See Hyperspace for full Linked Orders Report.   125 mg Oral 2 times daily with meals 03/06/22 0220 03/06/22 1240   03/06/22 1800  vancomycin (VANCOCIN) 500 mg in sodium chloride irrigation 0.9 % 100 mL ENEMA  Status:  Discontinued        500 mg Rectal Every 6 hours 03/06/22 1429 03/06/22 1528   03/06/22 1800  vancomycin (VANCOCIN) capsule 125 mg       Note to Pharmacy: 02/27/22 - 03/08/22: Take 1 capsule four times daily; 11/10- 11/16: take 1 capsule two times daily; 11/17 - 11/23: take 1 capsule once daily; from 11/24 and on, see other prescrption sent [facility]     125 mg Oral Every 6 hours 03/06/22 1528 03/09/22 1300   03/06/22 1300  metroNIDAZOLE (FLAGYL) IVPB 500 mg  Status:  Discontinued        500 mg 100 mL/hr over 60 Minutes Intravenous Every 8 hours 03/06/22 1249 03/08/22 0838   03/06/22 0800  vancomycin (VANCOCIN) capsule 125 mg  Status:  Discontinued       Note to Pharmacy: 02/27/22 - 03/08/22: Take 1 capsule four times daily; 11/10- 11/16: take 1 capsule two times daily; 11/17 - 11/23: take 1 capsule once daily; from 11/24 and on, see other prescrption sent [facility]     125 mg Oral 3 times daily before meals & bedtime 03/06/22 0216 03/06/22 1240       Assessment/Plan: POD 7,  s/p Subtotal colectomy with  end ileostomy for Pneumoperitoneum by Dr. Thermon Leyland on 03/20/22 - regular diet, Ensure - abx per ID.  No further needed from  surgical standpoint - WOCN for new ostomy - Path: A. COLON, SUBTOTAL COLECTOMY:  - Dilated colon with variably flattened intestinal folds.  - Serosal adhesions.  - No perforation identified.  - No dysplasia or malignancy.  -TNA off, eating slowly improving. - fiber and add low dose imodium.  Will decrease to daily imodium instead of BID to not make it too thick -agree with anticipated DC on Friday this week if all continues to go well -cont mobilization  FEN - regular, Ensure VTE - Eliquis ID - Flagyl 11/7 - 11/9. Vanc 11/7 - 11/16. Zosyn 11/22 >> WBC pending Foley - out, but required I&O cath, per medicine   Dementia PAF on Eliquis at baseline Recurrent C. Diff - C. Diff was negative 11/10. Tx w/ Vanc as above per ID  GI panel + for E. Coli HFpEF    Henreitta Cea 03/28/2022

## 2022-03-29 ENCOUNTER — Encounter: Payer: Self-pay | Admitting: Internal Medicine

## 2022-03-29 DIAGNOSIS — K567 Ileus, unspecified: Secondary | ICD-10-CM | POA: Diagnosis not present

## 2022-03-29 LAB — ECHOCARDIOGRAM COMPLETE
AR max vel: 1.54 cm2
AV Area VTI: 1.66 cm2
AV Area mean vel: 1.55 cm2
AV Mean grad: 10 mmHg
AV Peak grad: 19.9 mmHg
Ao pk vel: 2.23 m/s
Area-P 1/2: 4.31 cm2
Height: 62 in
S' Lateral: 2.8 cm
Weight: 1520.29 oz

## 2022-03-29 LAB — ZINC: Zinc: 67 ug/dL (ref 44–115)

## 2022-03-29 NOTE — Progress Notes (Signed)
[facility] Progress Note  8 Days Post-Op  Subjective: CC-  Family at bedside.  Eating breakfast. No n/v. Ileostomy output 150cc/24 hours. Nervous about going home.  Objective: Vital signs in last 24 hours: Temp:  [97.5 F (36.4 C)-97.6 F (36.4 C)] 97.6 F (36.4 C) (11/30 0833) Pulse Rate:  [60-73] 70 (11/30 0833) Resp:  [16-18] 16 (11/30 0833) BP: (87-111)/(56-65) 111/65 (11/30 0833) SpO2:  [74 %-100 %] 97 % (11/30 0833) Last BM Date : 03/28/22  Intake/Output from previous day: 11/29 0701 - 11/30 0700 In: -  Out: 950 [Urine:800; Stool:150] Intake/Output this shift: No intake/output data recorded.  PE: Gen:  Alert, NAD, pleasant Abd: soft, ND, NT, ileostomy pink with thicker semi-solid output, midline wound cdi with staples present and no erythema or drainage   Lab Results:  Recent Labs    03/26/22 1422 03/28/22 0930  WBC 17.1* 20.0*  HGB 10.5* 9.6*  HCT 32.0* 29.9*  PLT 406* 455*   BMET Recent Labs    03/27/22 0543 03/28/22 0400  NA 136 133*  K 4.8 4.5  CL 105 100  CO2 24 26  GLUCOSE 114* 88  BUN 27* 28*  CREATININE 0.39* 0.41*  CALCIUM 8.5* 8.5*   PT/INR No results for input(s): "LABPROT", "INR" in the last 72 hours. CMP     Component Value Date/Time   NA 133 (L) 03/28/2022 0400   K 4.5 03/28/2022 0400   CL 100 03/28/2022 0400   CO2 26 03/28/2022 0400   GLUCOSE 88 03/28/2022 0400   GLUCOSE 99 05/14/2006 0941   BUN 28 (H) 03/28/2022 0400   CREATININE 0.41 (L) 03/28/2022 0400   CALCIUM 8.5 (L) 03/28/2022 0400   PROT 5.4 (L) 03/23/2022 1112   ALBUMIN 2.4 (L) 03/23/2022 1112   AST 19 03/23/2022 1112   ALT 32 03/23/2022 1112   ALKPHOS 69 03/23/2022 1112   BILITOT 0.3 03/23/2022 1112   GFRNONAA >60 03/28/2022 0400   GFRAA >60 07/02/2019 1151   Lipase     Component Value Date/Time   LIPASE 45 03/05/2022 1607       Studies/Results: No results found.  Anti-infectives: Anti-infectives (From admission, onward)     Start     Dose/Rate Route Frequency Ordered Stop   05/17/22 1000  vancomycin (VANCOCIN) capsule 125 mg  Status:  Discontinued       See Hyperspace for full Linked Orders Report.   125 mg Oral Every 3 DAYS 03/22/22 0930 03/22/22 1235   04/28/22 1000  vancomycin (VANCOCIN) capsule 125 mg  Status:  Discontinued       See Hyperspace for full Linked Orders Report.   125 mg Oral Every 3 DAYS 03/23/22 1257 03/28/22 1051   04/20/22 1000  vancomycin (VANCOCIN) capsule 125 mg  Status:  Discontinued       See Hyperspace for full Linked Orders Report.   125 mg Oral Every other day 03/23/22 1257 03/28/22 1051   04/19/22 1000  vancomycin (VANCOCIN) capsule 125 mg  Status:  Discontinued       See Hyperspace for full Linked Orders Report.   125 mg Oral Every other day 03/22/22 0930 03/22/22 1235   04/13/22 1000  vancomycin (VANCOCIN) capsule 125 mg  Status:  Discontinued       See Hyperspace for full Linked Orders Report.   125 mg Oral Daily 03/23/22 1257 03/28/22 1051   04/12/22 1000  vancomycin (VANCOCIN) capsule 125 mg  Status:  Discontinued       See  Hyperspace for full Linked Orders Report.   125 mg Oral Daily 03/22/22 0930 03/22/22 1235   04/05/22 2200  vancomycin (VANCOCIN) capsule 125 mg  Status:  Discontinued       See Hyperspace for full Linked Orders Report.   125 mg Oral 2 times daily 03/23/22 1257 03/28/22 1051   04/05/22 1000  vancomycin (VANCOCIN) capsule 125 mg  Status:  Discontinued       See Hyperspace for full Linked Orders Report.   125 mg Oral 2 times daily 03/22/22 0930 03/22/22 1235   03/28/22 2000  vancomycin (VANCOCIN) capsule 125 mg        125 mg Oral 2 times daily 03/28/22 1051     03/23/22 1400  metroNIDAZOLE (FLAGYL) tablet 500 mg  Status:  Discontinued        500 mg Oral Every 8 hours 03/23/22 1255 03/26/22 0918   03/23/22 1400  vancomycin (VANCOCIN) capsule 125 mg  Status:  Discontinued       See Hyperspace for full Linked Orders Report.   125 mg Oral 4 times daily  03/23/22 1257 03/28/22 1051   03/22/22 1300  vancomycin (VANCOCIN) capsule 125 mg  Status:  Discontinued        125 mg Oral 4 times daily 03/22/22 0930 03/23/22 1257   03/22/22 1030  vancomycin (VANCOCIN) capsule 125 mg  Status:  Discontinued       See Hyperspace for full Linked Orders Report.   125 mg Oral 4 times daily 03/22/22 0930 03/22/22 1235   03/21/22 1415  metroNIDAZOLE (FLAGYL) IVPB 500 mg  Status:  Discontinued        500 mg 100 mL/hr over 60 Minutes Intravenous Every 8 hours 03/21/22 1350 03/23/22 1255   03/21/22 0100  piperacillin-tazobactam (ZOSYN) IVPB 3.375 g        3.375 g 12.5 mL/hr over 240 Minutes Intravenous Every 8 hours 03/21/22 0004 03/24/22 2159   03/16/22 0800  vancomycin (VANCOCIN) capsule 125 mg  Status:  Discontinued       See Hyperspace for full Linked Orders Report.   125 mg Oral Daily with breakfast 03/06/22 0220 03/06/22 1240   03/09/22 2200  vancomycin (VANCOCIN) capsule 125 mg       Note to Pharmacy: 02/27/22 - 03/08/22: Take 1 capsule four times daily; 11/10- 11/16: take 1 capsule two times daily; 11/17 - 11/23: take 1 capsule once daily; from 11/24 and on, see other prescrption sent to [facility]     125 mg Oral 2 times daily 03/09/22 1635 03/14/22 2126   03/09/22 1730  azithromycin (ZITHROMAX) 1,000 mg in dextrose 5 % 250 mL IVPB        1,000 mg 260 mL/hr over 60 Minutes Intravenous Every 24 hours 03/09/22 1635 03/09/22 2024   03/09/22 0800  vancomycin (VANCOCIN) capsule 125 mg  Status:  Discontinued       See Hyperspace for full Linked Orders Report.   125 mg Oral 2 times daily with meals 03/06/22 0220 03/06/22 1240   03/06/22 1800  vancomycin (VANCOCIN) 500 mg in sodium chloride irrigation 0.9 % 100 mL ENEMA  Status:  Discontinued        500 mg Rectal Every 6 hours 03/06/22 1429 03/06/22 1528   03/06/22 1800  vancomycin (VANCOCIN) capsule 125 mg       Note to Pharmacy: 02/27/22 - 03/08/22: Take 1 capsule four times daily; 11/10- 11/16: take 1 capsule two  times daily; 11/17 - 11/23: take 1 capsule once daily;  from 11/24 and on, see other prescrption [facility]     125 mg Oral Every 6 hours 03/06/22 1528 03/09/22 1300   03/06/22 1300  metroNIDAZOLE (FLAGYL) IVPB 500 mg  Status:  Discontinued        500 mg 100 mL/hr over 60 Minutes Intravenous Every 8 hours 03/06/22 1249 03/08/22 0838   03/06/22 0800  vancomycin (VANCOCIN) capsule 125 mg  Status:  Discontinued       Note to Pharmacy: 02/27/22 - 03/08/22: Take 1 capsule four times daily; 11/10- 11/16: take 1 capsule two times daily; 11/17 - 11/23: take 1 capsule once daily; from 11/24 and on, see other prescrption [facility]     125 mg Oral 3 times daily before meals & bedtime 03/06/22 0216 03/06/22 1240        Assessment/Plan POD 8,  s/p Subtotal colectomy with end ileostomy for Pneumoperitoneum by Dr. Thermon Leyland on 03/21/22 - Path: A. COLON, SUBTOTAL COLECTOMY:  - Dilated colon with variably flattened intestinal folds.  - Serosal adhesions.  - No perforation identified.  - No dysplasia or malignancy.   - regular diet, Ensure - abx per ID.  No further needed from surgical standpoint - WOCN for new ostomy. Ostomy clinic referral sent  - continue fiber and once daily low dose imodium. Monitor ileostomy output -agree with anticipated DC on Friday this week if all continues to go well. Discharge instructions and follow up info on AVS   FEN - regular, Ensure VTE - Eliquis ID - Flagyl 11/7 - 11/9. Vanc 11/7 - 11/16. Zosyn 11/22 >>  Foley - out   Dementia PAF on Eliquis at baseline Recurrent C. Diff - C. Diff was negative 11/10. Tx w/ Vanc as above per ID  GI panel + for E. Coli HFpEF      LOS: 23 days    Wellington [place], [facility] 03/29/2022, 9:57 AM Please see Amion for pager number during day hours 7:00am-4:30pm

## 2022-03-29 NOTE — Progress Notes (Signed)
[facility] for Infectious Disease    Date of Admission:  03/05/2022   Total days of antibiotics 10           ID: Julie Montgomery is a 86 y.o. female with   Principal Problem:   Ileus ([place]) Active Problems:   Goals of care, counseling/discussion   Dehydration   Protein calorie malnutrition (HCC)   Paroxysmal atrial fibrillation (HCC)   Recurrent Clostridioides difficile infection   C. difficile diarrhea   Recurrent colitis due to Clostridium difficile   Anemia of chronic disease   Protein-calorie malnutrition, severe   Abnormal x-ray of abdomen   Adynamic ileus ([place])   Peritonitis (HCC)    Subjective: Afebrile. Starting to eat more. Feeling abdominal fullness but no pain. No fevers. Ileostomy output  Medications:   amiodarone  200 mg Oral Daily   apixaban  2.5 mg Oral BID   Chlorhexidine Gluconate Cloth  6 each Topical Daily   feeding supplement  237 mL Oral TID BM   Gerhardt's butt cream   Topical BID   leptospermum manuka honey  1 Application Topical Daily   lidocaine  1 patch Transdermal Q24H   loperamide  2 mg Oral Daily   memantine  5 mg Oral BID   polycarbophil  1,250 mg Oral Daily   vancomycin  125 mg Oral BID   Vitamin D (Ergocalciferol)  50,000 Units Oral Q7 days    Objective: Vital signs in last 24 hours: Temp:  [97.5 F (36.4 C)-97.7 F (36.5 C)] 97.7 F (36.5 C) (11/30 1149) Pulse Rate:  [60-87] 87 (11/30 1149) Resp:  [16-18] 18 (11/30 1149) BP: (88-111)/(57-76) 97/76 (11/30 1149) SpO2:  [74 %-100 %] 96 % (11/30 1149)  Physical Exam  Constitutional:  oriented to person, place, and time. appears well-developed and well-nourished. No distress.  HENT: [place]/AT, PERRLA, no scleral icterus Mouth/Throat: Oropharynx is clear and moist. No oropharyngeal exudate.  Cardiovascular: Normal rate, regular rhythm and normal heart sounds. Exam reveals no gallop and no friction rub.  No murmur heard.  Pulmonary/Chest: Effort normal and breath sounds normal.  No respiratory distress.  has no wheezes.  Neck = supple, no nuchal rigidity Abdominal: Soft. Bowel sounds are normal.  exhibits no distension. There is no tenderness. ileostomy Lymphadenopathy: no cervical adenopathy. No axillary adenopathy Neurological: alert and oriented to person, place, and time.  Skin: Skin is warm and dry. No rash noted. No erythema.  Psychiatric: a normal mood and affect.  behavior is normal.    Lab Results Recent Labs    03/27/22 0543 03/28/22 0400 03/28/22 0930  WBC  --   --  20.0*  HGB  --   --  9.6*  HCT  --   --  29.9*  NA 136 133*  --   K 4.8 4.5  --   CL 105 100  --   CO2 24 26  --   BUN 27* 28*  --   CREATININE 0.39* 0.41*  --      Microbiology:  Studies/Results: No results found.   Assessment/Plan: Hx of cdifficile = had 4 days of abtx for perforated colon s/p resection, giving oral vanco proph for 14 days since 11/26. Will give 10 more days of oral vanco '125mg'$  po bid. Please have her follow up with ID in 2-4 wk.  Severe protein caloric malnutrition = continue with supplementation with protein drinks  Will sign off  [facility] Infectious Diseases Pager: [phone]  03/29/2022,  3:31 PM

## 2022-03-29 NOTE — TOC Progression Note (Signed)
Transition of Care [facility]) - Progression Note    Patient Details  Name: Julie Montgomery MRN: [id number] Date of Birth: 1935/12/22  [facility] CM/SW Contact  Pollie Friar, RN Phone Number: 03/29/2022, 1:44 PM  Clinical Narrative:    Plan is for discharge home tomorrow with home health services through [facility] for PT/OT/RN.  Pt with new ileostomy. WOC RN saw pt and family yesterday to do education. She has been enrolled in the [facility] program. CM has asked bedside RN to pass along to send home a few days worth of supplies for the ileostomy until hers arrive at the home.  Pts spouse to provide transport home at d/c.  TOC following.    Expected Discharge Plan: [place] Barriers to Discharge: Continued Medical Work up  Expected Discharge Plan and Services Expected Discharge Plan: [place] In-house Referral: NA Discharge Planning Services: CM Consult Post Acute Care Choice: [place], Resumption of Svcs/PTA Provider (palliative) Living arrangements for the past 2 months: Single Family Home                   DME Agency: NA       HH Arranged: PT, OT HH Agency: [place] Date [facility] Agency Contacted: 03/08/22 Time [place]: 2820 Representative spoke with at [place]: [place] ([place]) Interventions    Readmission Risk Interventions    03/08/2022    3:28 PM 02/27/2022   12:23 PM 02/12/2022    9:29 AM  Readmission Risk Prevention Plan  Transportation Screening Complete Complete Complete  PCP or Specialist Appt within 3-5 Days  Complete Complete  HRI or [place]  Complete Complete  Social Work Consult for [place] Planning/Counseling  Complete Complete  Palliative Care Screening  Not Applicable Not Applicable  Medication Review Press photographer) Complete Complete Complete  HRI or Home Care Consult Complete    SW Recovery Care/Counseling Consult Complete     Palliative Care Screening Complete    [place] Not Applicable

## 2022-03-29 NOTE — Progress Notes (Signed)
OT Cancellation Note  Patient Details Name: Julie Montgomery MRN: [id number] DOB: 1935-09-11   Cancelled Treatment:    Reason Eval/Treat Not Completed: Patient declined, no reason specified (Attempting 3x this date and pt refusing on all attempts (cold, wants to eat soon, family member present to visit))  Elder Cyphers, OTR/L [facility] Acute Rehabilitation Office: [phone]   Magnus Ivan 03/29/2022, 5:25 PM

## 2022-03-29 NOTE — Progress Notes (Signed)
PROGRESS NOTE    Julie Montgomery  WEX:[id number] DOB: 14-Dec-1935 DOA: 03/05/2022 PCP: Cassandria Anger, MD    Brief Narrative:  86 year old with history of dementia, recurrent C. difficile infection, permanent A-fib on Eliquis, chronic hyponatremia who was admitted with several days of poor oral intake, diarrhea. 11/6 admitted for FTT - BMI 17 11/9 started TPN 11/16 flex sig> congested mucosa, diverticula. Path not consistent with microscopic colitis or IBD.   11/22 perforated> colon resection with diverting ileostomy -> In ICU post-op intubated, requiring sedation due to agitation. ID supect perf abd from toxic megacolon. Rx resume oral vanc and IV flagyl and post op zosyn  Extubated in PM   Transferred to medical floor 11/23. A-fib with RVR and on amiodarone infusion along with heparin. 11/24, transferred to medical floor. Medically improving.  Remains very frail and debilitated.  On TPN. 11/28 Off TPN, advancing diet as tolerated 11/29 Mild orthostatics - DC furosemide; decrease vancomycin dose to '125mg'$  BID to prophylaxis dosing for the next 14d given negative Cdiff testing per ID  Assessment & Plan:   Acute intestinal perforation secondary to C diff toxic megacolon: 11/22: Colectomy and diverting ileostomy.  Surgically improving.  On regular diet and tolerating. Decrease vancomycin dose to '125mg'$  BID to prophylaxis dosing for the next 14d . Previously evaluated for fecal transplant. With improving oral intake, off TPN  Paroxysmal A-fib with RVR:  Rate controlled in sinus rhythm.  Was on amiodarone infusion in the ICU.  Rate is controlled now.  Currently on amiodarone 200 mg twice daily, will change to once a day today.  Will discharge on maintenance amiodarone and have outpatient follow-up with cardiology.  Therapeutic on Eliquis. DC furosemide.  Severe protein calorie malnutrition: Chronic and persistent problem.  Currently on TPN.  Tolerating regular diet.  Weaning off  today. Electrolyte replacement along with TPN protocol by pharmacy.  ICU acquired delirium: Resolved.   Ambulatory dysfunction, orthostatic hypotension Continue to mobilize.  Can come off telemetry monitor to help with mobility. Straight cath if needed for urinary retention.    DVT prophylaxis: apixaban (ELIQUIS) tablet 2.5 mg Start: 03/23/22 1130 SCDs Start: 03/05/22 2227 apixaban (ELIQUIS) tablet 2.5 mg   Code Status: Full code Family Communication: Son at bedside. Disposition Plan: Status is: Inpatient - Home with HHPT/OT/RN in 24h Remains inpatient appropriate because: Severe critical illness, diet tolerance.  Consultants:  Critical care General surgery Gastroenterology Infectious disease  Procedures:  Colectomy and ileostomy  Antimicrobials:  Vancomycin and Flagyl Zosyn 11/22--- 11/26.   Subjective: No acute issues or events overnight, somewhat orthostatic today with mobility but tolerating p.o. quite well.  Discussed possible disposition home with home health in the next few days if therapy continues to go well.   Objective: Vitals:   03/28/22 1153 03/28/22 1659 03/28/22 1949 03/28/22 2330  BP: (!) 87/56 (!) 99/57 (!) 88/59 (!) 91/58  Pulse: 73 64 62 60  Resp:  '18 16 16  '$ Temp: (!) 97.5 F (36.4 C) (!) 97.5 F (36.4 C) (!) 97.5 F (36.4 C) (!) 97.5 F (36.4 C)  TempSrc: Oral Oral Oral Oral  SpO2: 100% 100% 100% (!) 74%  Weight:      Height:        Intake/Output Summary (Last 24 hours) at 03/29/2022 0725 Last data filed at 03/28/2022 1100 Gross per 24 hour  Intake --  Output 950 ml  Net -950 ml    Filed Weights   03/22/22 0500 03/23/22 0600 03/24/22 0328  Weight: 43.1  kg 43.7 kg 48.1 kg    Examination:  General exam: Appears calm and comfortable .  Frail and debilitated.  Cachectic.  Respiratory system: Clear to auscultation. Respiratory effort normal. Cardiovascular system: S1 & S2 heard, RRR.  No pedal edema. Gastrointestinal system: Mild  tenderness without rigidity or guarding.  Ileostomy noted.  Midline surgical incision clean and dry. Central nervous system: Alert and oriented.  Awake and interactive.  No focal neurological deficits.  Asking repetitive questions today.  Data Reviewed: I have personally reviewed following labs and imaging studies  CBC: Recent Labs  Lab 03/23/22 0852 03/24/22 0350 03/25/22 0602 03/26/22 1422 03/28/22 0930  WBC 26.2* 18.2* 12.7* 17.1* 20.0*  NEUTROABS  --   --   --   --  16.4*  HGB 9.1* 8.3* 8.2* 10.5* 9.6*  HCT 28.0* 25.4* 25.2* 32.0* 29.9*  MCV 94.9 92.4 92.0 92.5 92.3  PLT 375 336 314 406* 455*    Basic Metabolic Panel: Recent Labs  Lab 03/23/22 1112 03/24/22 0350 03/25/22 0602 03/26/22 1422 03/27/22 0543 03/28/22 0400  NA 143 145 138  --  136 133*  K 2.3* 3.9 4.2  --  4.8 4.5  CL 106 107 105  --  105 100  CO2 '29 31 28  '$ --  24 26  GLUCOSE 231* 139* 149*  --  114* 88  BUN 24* 28* 27*  --  27* 28*  CREATININE 0.45 0.51 0.35*  --  0.39* 0.41*  CALCIUM 8.6* 9.2 8.3*  --  8.5* 8.5*  MG 1.8 2.3 1.9 2.0 2.0  --   PHOS 2.2* 3.1 2.5 2.9 2.8  --    Liver Function Tests: Recent Labs  Lab 03/23/22 0852 03/23/22 1112  AST 21 19  ALT 30 32  ALKPHOS 67 69  BILITOT 0.4 0.3  PROT 4.9* 5.4*  ALBUMIN 2.3* 2.4*    CBG: Recent Labs  Lab 03/27/22 1650 03/27/22 1959 03/27/22 2346 03/28/22 0024 03/28/22 0359  GLUCAP 75 136* 60* 71 75    Lipid Profile: Recent Labs    03/26/22 1422  TRIG 51    Radiology Studies: No results found.  Scheduled Meds:  amiodarone  200 mg Oral Daily   apixaban  2.5 mg Oral BID   Chlorhexidine Gluconate Cloth  6 each Topical Daily   feeding supplement  237 mL Oral TID BM   Gerhardt's butt cream   Topical BID   leptospermum manuka honey  1 Application Topical Daily   lidocaine  1 patch Transdermal Q24H   loperamide  2 mg Oral Daily   memantine  5 mg Oral BID   polycarbophil  1,250 mg Oral Daily   vancomycin  125 mg Oral BID    Vitamin D (Ergocalciferol)  50,000 Units Oral Q7 days   Continuous Infusions:  promethazine (PHENERGAN) injection (IM or IVPB) 200 mL/hr at 03/25/22 0600    LOS: 23 days   Time spent: 35 minutes  Little Ishikawa, DO [facility] Pager [phone]

## 2022-03-29 NOTE — Progress Notes (Signed)
Physical Therapy Treatment Patient Details Name: Julie Montgomery MRN: [id number] DOB: Mar 25, 1936 Today's Date: 03/29/2022   History of Present Illness Pt is an 86 y.o. female admitted 03/05/22 with BLE edema, ABd distension, poor PO intake. Pt with recurrent C-diff, colonic ileus, dehydration. Sigmoidoscopy 11/16 with findings of erythmatous and congested mucosa in sigmoid colon, diverticulosis. Afib with RVR 11/18. 11/22 Subtotal colectomy with end ileostomy for Pneumoperitoneum.  Of note, multiple recent admissions the past 3 months with c-diff. PMH includes CHF, PAF, anemia, osteopenia, TIA, chronic hyponatremia.    PT Comments    Patient remains anxious about mobilizing with myriad of reasons (I get too exhausted, I get short of breath, it takes away my appetite). Husband present and encouraging pt to participate. Patient continues to require up to mod assist for bed mobility, however at home she does not sleep in a bed (sleeps in her lift chair). Sit to stand with min assist when she pushes up from the furniture. Walking 120 ft with RW and minguard assist. Anticipate pt will be able to climb steps with min assist to enter home. Noted plans for hopeful discharge home tomorrow.     Recommendations for follow up therapy are one component of a multi-disciplinary discharge planning process, led by the attending physician.  Recommendations may be updated based on patient status, additional functional criteria and insurance authorization.  Follow Up Recommendations  Home health PT (after continued work with acute PT)     Assistance Recommended at Discharge Frequent or constant Supervision/Assistance  Patient can return home with the following A little help with bathing/dressing/bathroom;Assistance with cooking/housework;Direct supervision/assist for medications management;Direct supervision/assist for financial management;Assist for transportation;Help with stairs or ramp for entrance;Assistance  with feeding;A little help with walking and/or transfers   Equipment Recommendations  None recommended by PT    Recommendations for Other Services       Precautions / Restrictions Precautions Precautions: Fall;Other (comment) Precaution Comments: ostomy, bil foot wounds Restrictions Weight Bearing Restrictions: No     Mobility  Bed Mobility Overal bed mobility: Needs Assistance Bed Mobility: Rolling, Sidelying to Sit Rolling: Min assist Sidelying to sit: Mod assist       General bed mobility comments: normally sleeps in a lift chair; did better with rolling and side to sit    Transfers Overall transfer level: Needs assistance Equipment used: Rolling walker (2 wheels) Transfers: Sit to/from Stand Sit to Stand: Mod assist, Min assist           General transfer comment: initial attempt pt pulling up on RW and mod assist; returned to sit and pt pushed off the bed and min assist    Ambulation/Gait Ambulation/Gait assistance: Min assist Gait Distance (Feet): 120 Feet Assistive device: Rolling walker (2 wheels) Gait Pattern/deviations: Step-through pattern, Decreased stride length, Trunk flexed, Narrow base of support Gait velocity: decreased Gait velocity interpretation: 1.31 - 2.62 ft/sec, indicative of limited community ambulator   General Gait Details: pt anxious but grateful to be up walking   Stairs             Wheelchair Mobility    Modified Rankin (Stroke Patients Only)       Balance Overall balance assessment: Needs assistance Sitting-balance support: No upper extremity supported, Feet supported, Bilateral upper extremity supported Sitting balance-Leahy Scale: Fair Sitting balance - Comments: fair sitting balance with and without UE support   Standing balance support: Bilateral upper extremity supported Standing balance-Leahy Scale: Poor Standing balance comment: reliant on RW and tactile cues  for balance                             Cognition Arousal/Alertness: Awake/alert Behavior During Therapy: Anxious Overall Cognitive Status: History of cognitive impairments - at baseline Area of Impairment: Memory, Problem solving, Awareness, Following commands, Safety/judgement, Attention                   Current Attention Level: Sustained Memory: Decreased short-term memory Following Commands: Follows one step commands with increased time, Follows one step commands inconsistently Safety/Judgement: Decreased awareness of deficits, Decreased awareness of safety Awareness: Intellectual Problem Solving: Slow processing, Difficulty sequencing, Requires verbal cues, Decreased initiation General Comments: cognitive impairments at baseline, pt with STM challenges (repeats herself);        Exercises      General Comments        Pertinent Vitals/Pain Pain Assessment Pain Assessment: Faces Faces Pain Scale: Hurts little more Pain Location: bottom/sacrum Pain Descriptors / Indicators: Discomfort, Guarding, Sore Pain Intervention(s): Limited activity within patient's tolerance, Monitored during session, Repositioned    Home Living                          Prior Function            PT Goals (current goals can now be found in the care plan section) Acute Rehab PT Goals Patient Stated Goal: to go home PT Goal Formulation: With patient/family Time For Goal Achievement: 04/06/22 Potential to Achieve Goals: Fair Progress towards PT goals: Progressing toward goals    Frequency    Min 3X/week      PT Plan Current plan remains appropriate    Co-evaluation              AM-PAC PT "6 Clicks" Mobility   Outcome Measure  Help needed turning from your back to your side while in a flat bed without using bedrails?: A Little Help needed moving from lying on your back to sitting on the side of a flat bed without using bedrails?: A Lot Help needed moving to and from a bed to a chair (including  a wheelchair)?: A Little Help needed standing up from a chair using your arms (e.g., wheelchair or bedside chair)?: A Little Help needed to walk in hospital room?: A Little Help needed climbing 3-5 steps with a railing? : A Lot 6 Click Score: 16    End of Session Equipment Utilized During Treatment: Gait belt Activity Tolerance: Patient limited by fatigue Patient left: in chair;with call bell/phone within reach;with family/visitor present;with chair alarm set Nurse Communication: Mobility status PT Visit Diagnosis: Unsteadiness on feet (R26.81);Muscle weakness (generalized) (M62.81);Difficulty in walking, not elsewhere classified (R26.2);Pain Pain - part of body:  (sacrum)     Time: 2446-9507 PT Time Calculation (min) (ACUTE ONLY): 23 min  Charges:  $Gait Training: 23-37 mins                      [place]  Office [phone]    Rexanne Mano 03/29/2022, 3:47 PM

## 2022-03-30 DIAGNOSIS — K567 Ileus, unspecified: Secondary | ICD-10-CM | POA: Diagnosis not present

## 2022-03-30 LAB — GLUCOSE, CAPILLARY
Glucose-Capillary: 96 mg/dL (ref 70–99)
Glucose-Capillary: 193 mg/dL — ABNORMAL HIGH (ref 70–99)

## 2022-03-30 MED ORDER — OXYCODONE-ACETAMINOPHEN 5-325 MG PO TABS: 1.0000 | ORAL_TABLET | ORAL | 0 refills | Status: AC | PRN

## 2022-03-30 MED ORDER — LOPERAMIDE HCL 2 MG PO CAPS: 2.0000 mg | ORAL_CAPSULE | Freq: Every day | ORAL | 0 refills | Status: DC

## 2022-03-30 MED ORDER — ALUM & MAG HYDROXIDE-SIMETH 200-200-20 MG/5ML PO SUSP: 30.0000 mL | Freq: Four times a day (QID) | ORAL | 0 refills | Status: AC | PRN

## 2022-03-30 MED ORDER — SACCHAROMYCES BOULARDII 250 MG PO CAPS: 250.0000 mg | ORAL_CAPSULE | Freq: Two times a day (BID) | ORAL | 0 refills | Status: AC

## 2022-03-30 MED ORDER — VANCOMYCIN HCL 125 MG PO CAPS: 125.0000 mg | ORAL_CAPSULE | Freq: Two times a day (BID) | ORAL | 0 refills | Status: DC

## 2022-03-30 MED ORDER — AMIODARONE HCL 200 MG PO TABS: 200.0000 mg | ORAL_TABLET | Freq: Every day | ORAL | 1 refills | Status: AC

## 2022-03-30 MED ORDER — VITAMIN D (ERGOCALCIFEROL) 1.25 MG (50000 UNIT) PO CAPS: 50000.0000 [IU] | ORAL_CAPSULE | ORAL | 2 refills | Status: DC

## 2022-03-30 MED ORDER — CALCIUM POLYCARBOPHIL 625 MG PO TABS: 1250.0000 mg | ORAL_TABLET | Freq: Every day | ORAL | 0 refills | Status: DC

## 2022-03-30 NOTE — Plan of Care (Signed)
  Problem: Education: Goal: Knowledge of General Education information will improve Description: Including pain rating scale, medication(s)/side effects and non-pharmacologic comfort measures Outcome: Adequate for Discharge   Problem: Health Behavior/Discharge Planning: Goal: Ability to manage health-related needs will improve Outcome: Adequate for Discharge   Problem: Clinical Measurements: Goal: Ability to maintain clinical measurements within normal limits will improve Outcome: Adequate for Discharge Goal: Will remain free from infection Outcome: Adequate for Discharge Goal: Diagnostic test results will improve Outcome: Adequate for Discharge Goal: Respiratory complications will improve Outcome: Adequate for Discharge Goal: Cardiovascular complication will be avoided Outcome: Adequate for Discharge   Problem: Activity: Goal: Risk for activity intolerance will decrease Outcome: Adequate for Discharge   Problem: Nutrition: Goal: Adequate nutrition will be maintained Outcome: Adequate for Discharge   Problem: Coping: Goal: Level of anxiety will decrease Outcome: Adequate for Discharge   Problem: Elimination: Goal: Will not experience complications related to bowel motility Outcome: Adequate for Discharge Goal: Will not experience complications related to urinary retention Outcome: Adequate for Discharge   Problem: Pain Managment: Goal: General experience of comfort will improve Outcome: Adequate for Discharge   Problem: Safety: Goal: Ability to remain free from injury will improve Outcome: Adequate for Discharge   Problem: Skin Integrity: Goal: Risk for impaired skin integrity will decrease Outcome: Adequate for Discharge   Problem: Malnutrition  (NI-5.2) Goal: Food and/or nutrient delivery Description: Individualized approach for food/nutrient provision. Outcome: Adequate for Discharge   Problem: Acute Rehab OT Goals (only OT should resolve) Goal: Pt. Will  Perform Eating Outcome: Adequate for Discharge Goal: Pt. Will Perform Grooming Outcome: Adequate for Discharge Goal: Pt. Will Perform Upper Body Bathing Outcome: Adequate for Discharge Goal: Pt. Will Perform Upper Body Dressing Outcome: Adequate for Discharge Goal: Pt. Will Transfer To Toilet Outcome: Adequate for Discharge Goal: Pt. Will Perform Toileting-Clothing Manipulation Outcome: Adequate for Discharge Goal: OT Additional ADL Goal #1 Outcome: Adequate for Discharge   Problem: Acute Rehab PT Goals(only PT should resolve) Goal: Pt Will Go Supine/Side To Sit Outcome: Adequate for Discharge Goal: Patient Will Transfer Sit To/From Stand Outcome: Adequate for Discharge Goal: Pt Will Ambulate Outcome: Adequate for Discharge Goal: Pt Will Go Up/Down Stairs Outcome: Adequate for Discharge   Problem: Education: Goal: Ability to describe self-care measures that may prevent or decrease complications (Diabetes Survival Skills Education) will improve Outcome: Adequate for Discharge Goal: Individualized Educational Video(s) Outcome: Adequate for Discharge   Problem: Coping: Goal: Ability to adjust to condition or change in health will improve Outcome: Adequate for Discharge   Problem: Fluid Volume: Goal: Ability to maintain a balanced intake and output will improve Outcome: Adequate for Discharge   Problem: Health Behavior/Discharge Planning: Goal: Ability to identify and utilize available resources and services will improve Outcome: Adequate for Discharge Goal: Ability to manage health-related needs will improve Outcome: Adequate for Discharge   Problem: Metabolic: Goal: Ability to maintain appropriate glucose levels will improve Outcome: Adequate for Discharge   Problem: Nutritional: Goal: Maintenance of adequate nutrition will improve Outcome: Adequate for Discharge Goal: Progress toward achieving an optimal weight will improve Outcome: Adequate for Discharge    Problem: Skin Integrity: Goal: Risk for impaired skin integrity will decrease Outcome: Adequate for Discharge   Problem: Tissue Perfusion: Goal: Adequacy of tissue perfusion will improve Outcome: Adequate for Discharge   Problem: Safety: Goal: Non-violent Restraint(s) Outcome: Adequate for Discharge

## 2022-03-30 NOTE — Plan of Care (Signed)
  Problem: Skin Integrity: Goal: Risk for impaired skin integrity will decrease Outcome: Not Progressing   Problem: Tissue Perfusion: Goal: Adequacy of tissue perfusion will improve Outcome: Not Progressing

## 2022-03-30 NOTE — Progress Notes (Signed)
Discharge instructions (including medications) discussed with and copy provided to patient/caregiver. Patient pending discharge after PICC removal.

## 2022-03-30 NOTE — Discharge Summary (Signed)
Physician Discharge Summary  Julie Montgomery VZD:[id number] DOB: 05/12/1935 DOA: 03/05/2022  PCP: Cassandria Anger, MD  Admit date: 03/05/2022 Discharge date: 03/30/2022  Admitted From: Home Disposition:  Home with home health  Recommendations for Outpatient Follow-up:  Follow up with PCP in 1-2 weeks Follow up with surgery as scheduled  Home Health:PT,OT,RN  Equipment/Devices:No new equipment  Discharge Condition:Stable  CODE STATUS:Full  Diet recommendation: Regular diet as tolerated   Brief/Interim Summary: 86 year old with history of dementia, recurrent C. difficile infection, permanent A-fib on Eliquis, chronic hyponatremia who was admitted with several days of poor oral intake, diarrhea.  11/6 admitted for FTT - BMI 17 11/9 started TPN 11/16 flex sig> congested mucosa, diverticula. Path not consistent with microscopic colitis or IBD.   11/22 perforated> colon resection with diverting ileostomy -> In ICU post-op intubated, requiring sedation due to agitation. ID supect perf abd from toxic megacolon. Rx resume oral vanc and IV flagyl and post op zosyn  Extubated in PM   Transferred to medical floor 11/23. A-fib with RVR and on amiodarone infusion along with heparin. 11/24, transferred to medical floor. Medically improving.  Remains very frail and debilitated.  On TPN. 11/28 Off TPN, advancing diet as tolerated 11/29 Mild orthostatics - DC furosemide; decrease vancomycin dose to '125mg'$  BID to prophylaxis dosing for the next 14d given negative Cdiff testing per ID 11/30-12/1-patient remains medically stable for discharge, tolerating p.o. well, ostomy output improving otherwise stable for discharge home with home health PT OT RN and follow-up with PCP and general surgery as scheduled  Discharge Diagnoses:  Principal Problem:   Ileus ([place]) Active Problems:   Recurrent Clostridioides difficile infection   Dehydration   Paroxysmal atrial fibrillation ([facility])   Anemia of  chronic disease   Protein calorie malnutrition ([place])   Goals of care, counseling/discussion   C. difficile diarrhea   Recurrent colitis due to Clostridium difficile   Protein-calorie malnutrition, severe   Abnormal x-ray of abdomen   Adynamic ileus ([place])   Peritonitis ([place])  Acute intestinal perforation secondary to C diff toxic megacolon: 11/22: Colectomy and diverting ileostomy.  Surgically improving.  On regular diet and tolerating well. Decrease vancomycin dose to '125mg'$  BID to prophylaxis dosing for 14-day course per ID Previously evaluated for fecal transplant -recommend ongoing probiotics in the interim. With improving oral intake TPN has previously been discontinued   Paroxysmal A-fib with RVR:  Rate controlled in sinus rhythm.  Was on amiodarone infusion in the ICU.  Rate is controlled now.  Currently on amiodarone 200 mg daily, follow-up with cardiology.  Therapeutic on Eliquis. DC furosemide.   Severe protein calorie malnutrition: Chronic and persistent problem.  Currently tolerating p.o. well, continue regular diet.     ICU acquired delirium: Resolved.    Ambulatory dysfunction, orthostatic hypotension, resolved Continue to mobilize.  Can come off telemetry monitor to help with mobility. Straight cath if needed for urinary retention.     Discharge Instructions  Discharge Instructions     Amb Referral to Ostomy Clinic   Complete by: As directed    Reason for referral modifiers:  Pre and post-operative counseling for ostomy management Information about lifestyle, intimacy, diet and clothing options     Face-to-face encounter (required for Medicare/Medicaid patients)   Complete by: As directed    I Little Ishikawa certify that this patient is under my care and that I, or a nurse practitioner or physician's assistant working with me, had a face-to-face encounter that meets the physician face-to-face  encounter requirements with this patient on 03/30/2022. The encounter  with the patient was in whole, or in part for the following medical condition(s) which is the primary reason for home health care (List medical condition): ambulatory dysfunction, postoperative wound and ostomy care   The encounter with the patient was in whole, or in part, for the following medical condition, which is the primary reason for home health care: ambulatory dysfunction, postoperative wound and ostomy care   I certify that, based on my findings, the following services are medically necessary home health services:  Nursing Physical therapy     Reason for Medically Necessary Home Health Services:  Skilled Nursing- Change/Decline in Patient Status Skilled Nursing- Complex Wound Care Skilled Nursing- Post-Surgical Wound Assessment and Care Therapy- Gait Training, Transfer Training and Stair Training Therapy- Instruction on use of Assistive Device for Ambulation on all Surfaces     My clinical findings support the need for the above services: Unable to leave home safely without assistance and/or assistive device   Further, I certify that my clinical findings support that this patient is homebound due to: Unable to leave home safely without assistance   Home Health   Complete by: As directed    To provide the following care/treatments:  PT RN OT        Allergies as of 03/30/2022       Reactions   Lactose Intolerance (gi) Other (See Comments)   Intolerance    Lovastatin Other (See Comments)   Unknown reaction   Mometasone Furo-formoterol Fum Other (See Comments)   Loss of appetite, laryngitis    Peanut-containing Drug Products Other (See Comments)   Prozac [fluoxetine Hcl] Other (See Comments)   Jumpy   Sulfa Antibiotics Other (See Comments)   Unknown reaction        Medication List     TAKE these medications    Align 4 MG Caps Take 1 capsule (4 mg total) by mouth daily.   alum & mag hydroxide-simeth 200-200-20 MG/5ML suspension Commonly known as:  MAALOX/MYLANTA Take 30 mLs by mouth every 6 (six) hours as needed for indigestion or heartburn.   amiodarone 200 MG tablet Commonly known as: PACERONE Take 1 tablet (200 mg total) by mouth daily.   Cartia XT 180 MG 24 hr capsule Generic drug: diltiazem Take 1 capsule (180 mg total) by mouth daily.   Eliquis 2.5 MG Tabs tablet Generic drug: apixaban Take 1 tablet (2.5 mg total) by mouth 2 (two) times daily.   loperamide 2 MG capsule Commonly known as: IMODIUM Take 1 capsule (2 mg total) by mouth daily.   memantine 5 MG tablet Commonly known as: NAMENDA Take 5 mg by mouth 2 (two) times daily.   oxyCODONE-acetaminophen 5-325 MG tablet Commonly known as: PERCOCET/ROXICET Take 1 tablet by mouth every 4 (four) hours as needed for up to 3 days for moderate pain or severe pain.   polycarbophil 625 MG tablet Commonly known as: FIBERCON Take 2 tablets (1,250 mg total) by mouth daily.   saccharomyces boulardii 250 MG capsule Commonly known as: FLORASTOR Take 1 capsule (250 mg total) by mouth 2 (two) times daily.   vancomycin 125 MG capsule Commonly known as: VANCOCIN Take 1 capsule (125 mg total) by mouth 2 (two) times daily for 12 days. What changed:  how much to take how to take this when to take this additional instructions Another medication with the same name was removed. Continue taking this medication, and follow the directions you see here.  Vitamin D (Ergocalciferol) 1.25 MG (50000 UNIT) Caps capsule Commonly known as: DRISDOL Take 1 capsule (50,000 Units total) by mouth every 7 (seven) days. Start taking on: April 05, 2022   ZINC PO Take 1 tablet by mouth daily.        Follow-up Information     [facility] Follow up.   Specialty: PALLIATIVE CARE Why: The palliative care will update you on first home visit. Contact information: [address] [place] [address] [place], [facility] Follow  up.   Specialty: Home Health Services Why: The home health agency will contact you for the first home visti. Contact information: [place] [address] [phone]         [facility] Follow up on 04/04/2022.   Specialty: General Surgery Why: 10 am, This is a nurse only visit for staple removal, Arrive 30 minutes prior to your appointment time, Please bring your insurance card and photo ID Contact information: [place] [address] [phone]         Stechschulte, Nickola Major, MD Follow up on 04/19/2022.   Specialty: Surgery Why: 10:50 am, Arrive 15 minutes prior to your appointment time, Please bring your insurance card and photo ID Contact information: [address] [place] [address] [phone]         Laurice Record, MD Follow up.   Specialty: Infectious Diseases Why: Hospital Discharge Follow Up with Dr. Candiss Norse at 3:00 pm. Please arrive 15 minutes early. Contact information: [address] [place] [address] [phone]                Allergies  Allergen Reactions   Lactose Intolerance (Gi) Other (See Comments)    Intolerance    Lovastatin Other (See Comments)    Unknown reaction   Mometasone Furo-Formoterol Fum Other (See Comments)    Loss of appetite, laryngitis    Peanut-Containing Drug Products Other (See Comments)   Prozac [Fluoxetine Hcl] Other (See Comments)    Jumpy   Sulfa Antibiotics Other (See Comments)    Unknown reaction    Consultations: ICU, infectious disease, cardiology, general surgery  Procedures/Studies: ECHOCARDIOGRAM COMPLETE  Result Date: 03/29/2022    ECHOCARDIOGRAM REPORT   Patient Name:   Julie Montgomery Date of Exam: 03/22/2022 Medical Rec #:  [id number]       Height:       62.0 in Accession #:    [id number]      Weight:       95.0 lb Date of Birth:  29-Sep-1935      BSA:          1.393 m Patient Age:    19 years         BP:           135/55 mmHg Patient Gender: F               HR:           90 bpm. Exam Location:  Inpatient Procedure: 2D Echo, Cardiac Doppler and Color Doppler Indications:     Atrial fibrillation  History:         Patient has prior history of Echocardiogram examinations, most                  recent 12/14/2021. Arrythmias:Atrial Fibrillation; Risk  Factors:Hypertension.  Sonographer:     Clayton Lefort RDCS (AE) Referring Phys:  Brand Males Diagnosing Phys: Collene Mares Custovic IMPRESSIONS  1. Left ventricular ejection fraction, by estimation, is 60 to 65%. Left ventricular ejection fraction by PLAX is 64 %. The left ventricle has normal function. The left ventricle has no regional wall motion abnormalities. Left ventricular diastolic parameters are consistent with Grade I diastolic dysfunction (impaired relaxation).  2. Right ventricular systolic function is normal. The right ventricular size is normal.  3. The mitral valve is grossly normal. Trivial mitral valve regurgitation. No evidence of mitral stenosis.  4. The aortic valve is abnormal. Aortic valve regurgitation is not visualized. Aortic valve sclerosis is present, with no evidence of aortic valve stenosis. FINDINGS  Left Ventricle: Left ventricular ejection fraction, by estimation, is 60 to 65%. Left ventricular ejection fraction by PLAX is 64 %. The left ventricle has normal function. The left ventricle has no regional wall motion abnormalities. The left ventricular internal cavity size was normal in size. There is no left ventricular hypertrophy. Left ventricular diastolic parameters are consistent with Grade I diastolic dysfunction (impaired relaxation). Right Ventricle: The right ventricular size is normal. No increase in right ventricular wall thickness. Right ventricular systolic function is normal. Left Atrium: Left atrial size was normal in size. Right Atrium: Right atrial size was normal in size. Pericardium: There is no evidence of  pericardial effusion. Mitral Valve: The mitral valve is grossly normal. There is mild thickening of the mitral valve leaflet(s). There is mild calcification of the mitral valve leaflet(s). Trivial mitral valve regurgitation. No evidence of mitral valve stenosis. Tricuspid Valve: The tricuspid valve is grossly normal. Tricuspid valve regurgitation is trivial. Aortic Valve: The aortic valve is abnormal. Aortic valve regurgitation is not visualized. Aortic valve sclerosis is present, with no evidence of aortic valve stenosis. Aortic valve mean gradient measures 10.0 mmHg. Aortic valve peak gradient measures 19.9 mmHg. Aortic valve area, by VTI measures 1.66 cm. Pulmonic Valve: The pulmonic valve was grossly normal. Pulmonic valve regurgitation is not visualized. Aorta: The aortic root is normal in size and structure. IAS/Shunts: No atrial level shunt detected by color flow Doppler.  LEFT VENTRICLE PLAX 2D LV EF:         Left            Diastology                ventricular     LV e' medial:    8.05 cm/s                ejection        LV E/e' medial:  12.5                fraction by     LV e' lateral:   11.30 cm/s                PLAX is 64      LV E/e' lateral: 8.9                %. LVIDd:         4.30 cm LVIDs:         2.80 cm LV PW:         0.80 cm LV IVS:        0.80 cm LVOT diam:     1.90 cm LV SV:         61 LV SV Index:   44 LVOT Area:  2.84 cm  RIGHT VENTRICLE RV Basal diam:  2.70 cm RV S prime:     12.60 cm/s TAPSE (M-mode): 2.6 cm LEFT ATRIUM           Index        RIGHT ATRIUM           Index LA diam:      2.50 cm 1.79 cm/m   RA Area:     12.60 cm LA Vol (A2C): 21.0 ml 15.07 ml/m  RA Volume:   29.10 ml  20.89 ml/m LA Vol (A4C): 21.0 ml 15.07 ml/m  AORTIC VALVE AV Area (Vmax):    1.54 cm AV Area (Vmean):   1.55 cm AV Area (VTI):     1.66 cm AV Vmax:           223.00 cm/s AV Vmean:          143.000 cm/s AV VTI:            0.365 m AV Peak Grad:      19.9 mmHg AV Mean Grad:      10.0 mmHg LVOT Vmax:          121.00 cm/s LVOT Vmean:        78.000 cm/s LVOT VTI:          0.214 m LVOT/AV VTI ratio: 0.59  AORTA Ao Root diam: 2.30 cm MITRAL VALVE MV Area (PHT): 4.31 cm     SHUNTS MV Decel Time: 176 msec     Systemic VTI:  0.21 m MV E velocity: 101.00 cm/s  Systemic Diam: 1.90 cm MV A velocity: 108.00 cm/s MV E/A ratio:  0.94 Sabina Custovic Electronically signed by Floydene Flock Signature Date/Time: 03/29/2022/7:40:35 PM    Final    DG CHEST PORT 1 VIEW  Result Date: 03/23/2022 CLINICAL DATA:  Respiratory distress. EXAM: PORTABLE CHEST 1 VIEW COMPARISON:  03/20/2022 and prior studies FINDINGS: Cardiomediastinal silhouette is unchanged. A RIGHT PICC line is identified with tip overlying the LOWER SVC. Increasing pulmonary vascular congestion and mild bilateral interstitial opacities are present. New moderate to large sized RIGHT LOWER lung opacity noted. Small bilateral pleural effusions have slightly increased. There is no evidence of pneumothorax. IMPRESSION: 1. Increasing interstitial pulmonary edema and slightly increased small bilateral pleural effusions. 2. New moderate to large sized RIGHT LOWER lung opacity which may represent airspace disease/pneumonia, atelectasis and/or effusion. Electronically Signed   By: Margarette Canada M.D.   On: 03/23/2022 08:24   DG Abd Portable 1V  Result Date: 03/22/2022 CLINICAL DATA:  Encounter for nasogastric tube placement. EXAM: PORTABLE ABDOMEN - 1 VIEW COMPARISON:  03/20/2022 FINDINGS: Nasogastric tube tip is in the left abdomen and likely within the stomach. Evidence for a PICC line tip in the SVC region but only partially imaged. Limited evaluation of the lung bases. Skin staples in the abdomen. Small amount of gas within the stomach. IMPRESSION: Nasogastric tube is in the left abdomen and expected region of the stomach. Electronically Signed   By: Markus Daft M.D.   On: 03/22/2022 07:56   DG Abd Portable 1V  Addendum Date: 03/21/2022   ADDENDUM REPORT:  03/21/2022 00:31 ADDENDUM: These results were called by telephone at the time of interpretation on 03/20/2022 at 11:35 pm to provider TIMOTHY OPYD , who verbally acknowledged these results. Electronically Signed   By: Ronney Asters M.D.   On: 03/21/2022 00:31   Result Date: 03/21/2022 CLINICAL DATA:  Abdominal pain EXAM: PORTABLE ABDOMEN - 1 VIEW COMPARISON:  Abdominal x-ray 03/16/2022. CT abdomen and pelvis 03/05/2022 FINDINGS: There is a new large amount of free intraperitoneal air. There are dilated loops of bowel in the central abdomen measuring up to 10 cm. Minimal colonic gas and rectal gas present. No suspicious calcifications. Lung bases are clear. No acute fractures are identified. IMPRESSION: 1. New large amount of free intraperitoneal air. 2. Dilated loops of bowel in the central abdomen measuring up to 10 cm, likely colon. Electronically Signed: By: Ronney Asters M.D. On: 03/20/2022 23:15   DG CHEST PORT 1 VIEW  Result Date: 03/20/2022 CLINICAL DATA:  Shortness of breath EXAM: PORTABLE CHEST 1 VIEW COMPARISON:  Previous studies including the examination of 03/19/2022 FINDINGS: Cardiac size is within normal limits. Tip of right PICC line is seen in superior vena cava. There is interval decrease in interstitial markings in parahilar regions and lower lung fields suggesting further clearing of pulmonary edema. No new focal infiltrates are seen. Small bilateral pleural effusions are seen. There is no pneumothorax. Degenerative changes are noted in right shoulder. IMPRESSION: There is decrease in interstitial markings in both lungs suggesting clearing of pulmonary edema. No new focal infiltrates are seen. Small bilateral pleural effusions are seen. Electronically Signed   By: Elmer Picker M.D.   On: 03/20/2022 12:37   DG CHEST PORT 1 VIEW  Result Date: 03/19/2022 CLINICAL DATA:  Shortness of breath EXAM: PORTABLE CHEST 1 VIEW COMPARISON:  Radiograph 03/16/2022 FINDINGS: Right upper  extremity PICC tip overlies the distal SVC. Unchanged size of the cardiomediastinal silhouette. There are mild interstitial opacities and central pulmonary vascular prominence, decreased from prior exam. Persistent small pleural effusions. No pneumothorax. No new airspace disease. Thoracic spondylosis. Bilateral shoulder degenerative changes. IMPRESSION: Improving interstitial pulmonary edema. Persistent small pleural effusions. Electronically Signed   By: Maurine Simmering M.D.   On: 03/19/2022 11:52   DG CHEST PORT 1 VIEW  Result Date: 03/16/2022 CLINICAL DATA:  Shortness of breath EXAM: PORTABLE CHEST 1 VIEW COMPARISON:  03/05/2022 FINDINGS: Transverse diameter of heart is within normal limits. Central pulmonary vessels are more prominent. There is subtle increase in interstitial markings in parahilar regions and right lower lung field. Small bilateral pleural effusions are seen. There is no focal pulmonary consolidation. Tip of right upper extremity PICC line is seen in superior vena cava. IMPRESSION: Central pulmonary vessels are more prominent suggesting CHF. There is subtle increase in interstitial markings in both lungs suggesting interstitial pulmonary edema. Small bilateral pleural effusions. Electronically Signed   By: Elmer Picker M.D.   On: 03/16/2022 18:38   DG Abd 1 View  Result Date: 03/16/2022 CLINICAL DATA:  Abdominal distension EXAM: ABDOMEN - 1 VIEW COMPARISON:  None Available. FINDINGS: No dilated large or small bowel. Gas in the rectum. No organomegaly. No pathologic calcifications. IMPRESSION: No evidence of bowel obstruction. Electronically Signed   By: Suzy Bouchard M.D.   On: 03/16/2022 18:37   DG Abd Portable 1V  Result Date: 03/11/2022 CLINICAL DATA:  Follow-up ileus. EXAM: PORTABLE ABDOMEN - 1 VIEW COMPARISON:  03/09/2022 and older studies.  CT, 03/05/2022. FINDINGS: Gastro loops of small bowel and colon are noted mostly in the central to lower abdomen and pelvis.  There is no bowel dilation, however, to suggest obstruction or significant residual adynamic ileus. Left upper quadrant calcification again noted reflecting a peripherally calcified splenic artery aneurysm. Surgical vascular clips in the right mid abdomen from a prior cholecystectomy. IMPRESSION: 1. No bowel dilation to suggest obstruction or significant adynamic ileus. Electronically  Signed   By: Lajean Manes M.D.   On: 03/11/2022 10:41   [place] EKG SITE RITE  Result Date: 03/09/2022 If Site Rite image not attached, placement could not be confirmed due to current cardiac rhythm.  DG Abd Portable 1V  Result Date: 03/09/2022 CLINICAL DATA:  Evaluate for ileus. EXAM: PORTABLE ABDOMEN - 1 VIEW COMPARISON:  03/08/22 FINDINGS: Interval improvement in gaseous distension of the stomach. No dilated small bowel loops identified. Gas noted within the colon up to the level of the rectum. Calcified splenic artery aneurysm is again noted measuring 1.3 cm. Surgical clips within the right hemiabdomen, stable. IMPRESSION: 1. Interval improvement in gaseous distension of the stomach. 2. Nonobstructive bowel gas pattern. Electronically Signed   By: Kerby Moors M.D.   On: 03/09/2022 06:52   DG Abd Portable 1V  Result Date: 03/08/2022 CLINICAL DATA:  Ileus EXAM: PORTABLE ABDOMEN - 1 VIEW COMPARISON:  03/07/2022 FINDINGS: Interval decrease in gas distention of the stomach, small bowel, and colon, with gas present to the rectum. No substantial burden of stool. No free air in the abdomen on supine radiographs. IMPRESSION: Interval decrease in gas distention of the stomach, small bowel, and colon, with gas present to the rectum. Findings consistent with improved ileus. Electronically Signed   By: Delanna Ahmadi M.D.   On: 03/08/2022 08:30   DG Abd Portable 1V  Result Date: 03/07/2022 CLINICAL DATA:  Provided history: Ileus. EXAM: PORTABLE ABDOMEN - 1 VIEW COMPARISON:  CT abdomen/pelvis 03/05/2022. Prior abdominal  radiographs 03/05/2022 and earlier. FINDINGS: Persistent prominent air distention of the colon. Most notably, the transverse colon measures 12.5 cm in diameter, slightly decreased from the abdominal radiographs of 03/05/2022 (measuring 14.7 cm in diameter on this prior exam). No definite dilated small bowel loops are appreciated. Evaluation for intraperitoneal free air is limited on supine radiography. Known hiatal hernia. Lumbar spondylosis. IMPRESSION: Persistent prominent air distention of the colon. Most notably, the transverse colon measures 12.5 cm in diameter (slightly decreased from the prior abdominal radiographs of 03/05/2022). No definite dilated small bowel loops are identified. Known hiatal hernia. Electronically Signed   By: Kellie Simmering D.O.   On: 03/07/2022 11:17   CT ABDOMEN PELVIS W CONTRAST  Result Date: 03/05/2022 CLINICAL DATA:  Complains of abdominal swelling. EXAM: CT ABDOMEN AND PELVIS WITH CONTRAST TECHNIQUE: Multidetector CT imaging of the abdomen and pelvis was performed using the standard protocol following bolus administration of intravenous contrast. RADIATION DOSE REDUCTION: This exam was performed according to the departmental dose-optimization program which includes automated exposure control, adjustment of the mA and/or kV according to patient size and/or use of iterative reconstruction technique. CONTRAST:  79m OMNIPAQUE IOHEXOL 350 MG/ML SOLN COMPARISON:  02/24/2022 FINDINGS: Lower chest: Unchanged small left pleural effusion. Trace right effusion appears similar to previous exam. Peripheral tree-in-bud nodularity with areas of mucoid impaction again noted within both lung bases. Hepatobiliary: No suspicious liver lesion. Cholecystectomy. No biliary dilatation. Pancreas: Unremarkable. No pancreatic ductal dilatation or surrounding inflammatory changes. Spleen: Normal in size without focal abnormality. Adrenals/Urinary Tract: No adrenal mass. Simple appearing left kidney cyst  measures 1.3 cm, image 30/3. No nephrolithiasis or hydronephrosis. Urinary bladder is unremarkable. Stomach/Bowel: Small to moderate hiatal hernia. Increase caliber of the proximal small bowel loops within the left hemiabdomen which measure up to 2.9 cm, image 47/3. Fecalization of the prominent small bowel loops noted compatible with diminished bowel transit. There is persistent colonic distension. The cecum measures up to 9.3 cm compared with 7.8 cm  previously. Colonic air-fluid levels are identified. Assessment for colonic wall thickening is decreased due to diffuse colonic distension. Transition to decreased caliber distal colon is noted at the level of the mid sigmoid colon in the left hemipelvis, image 64/3. Signs of pelvic floor laxity identified with evidence of small rectocele. No signs pneumatosis or bowel perforation. Vascular/Lymphatic: Aortic atherosclerosis. Unchanged splenic artery aneurysm measuring 1.5 cm, image 20/3. No signs of abdominopelvic adenopathy. Reproductive: Status post hysterectomy. No adnexal masses. Other: A trace amount of free fluid is noted within the dependent portion of the pelvis, image 69/3. No free air identified. Musculoskeletal: No acute or significant osseous findings. There is diffuse subcutaneous edema involving the abdomen and visualized portions of the lower extremity. IMPRESSION: 1. Persistent and mildly progressive colonic distension with air-fluid levels. Transition to decreased caliber distal colon at the level of the mid sigmoid colon in the left hemipelvis. Increase caliber of the proximal small bowel loops within the left hemiabdomen which measure up to 2.9 cm. Fecalization of the prominent small bowel loops noted compatible with diminished bowel transit. Etiology of the persistent colonic distension is indeterminate. Cannot exclude toxic megacolon in this patient who has a reported history of C diff colitis. No signs of pneumatosis or bowel perforation at this  time. 2. Small to moderate hiatal hernia. 3. Unchanged small left pleural effusion. Trace right effusion appears similar to previous exam. 4. Peripheral tree-in-bud nodularity with areas of mucoid impaction again noted within both lung bases. Findings are favored to represent sequelae of chronic indolent atypical infection such as MAI. 5. Unchanged splenic artery aneurysm measuring 1.5 cm. 6. Signs of pelvic floor laxity with evidence of small rectocele. 7. Trace amount of free fluid is noted within the dependent portion of the pelvis. 8.  Aortic Atherosclerosis (ICD10-I70.0). Electronically Signed   By: Kerby Moors M.D.   On: 03/05/2022 19:32   DG Abdomen 1 View  Result Date: 03/05/2022 CLINICAL DATA:  Abdominal distention EXAM: ABDOMEN - 1 VIEW COMPARISON:  02/27/2022 FINDINGS: There is marked gaseous distention of right colon measuring up to 14.7 cm in diameter. Ascending colon measured 12.8 cm in the previous study. There is mild dilation of few small bowel loops measuring up to 3.5 cm. Stomach is not distended. There is lucency in the retrocardiac region, possibly suggesting presence of small hiatal hernia. IMPRESSION: There is marked dilation of ascending colon with interval worsening. There is mild dilation of small-bowel loops. Findings may suggest ileus. If there are continued symptoms, follow-up CT may be considered. Electronically Signed   By: Elmer Picker M.D.   On: 03/05/2022 17:08   DG Chest 2 View  Result Date: 03/05/2022 CLINICAL DATA:  Cough EXAM: CHEST - 2 VIEW COMPARISON:  01/26/2022 FINDINGS: Cardiac size is within normal limits. Low position of diaphragms suggests COPD. There are no signs of alveolar pulmonary edema or focal pulmonary consolidation. There is interval appearance of small bilateral pleural effusions. There is no pneumothorax. IMPRESSION: Small bilateral pleural effusions. COPD. There are no signs of pulmonary edema or focal pulmonary consolidation. Electronically  Signed   By: Elmer Picker M.D.   On: 03/05/2022 17:05     Subjective: No acute issues or events overnight   Discharge Exam: Vitals:   03/29/22 2330 03/30/22 0418  BP: 105/62 106/64  Pulse: 64 77  Resp: 16 16  Temp: (!) 97.5 F (36.4 C) 97.6 F (36.4 C)  SpO2: 98% 98%   Vitals:   03/29/22 1948 03/29/22 2330 03/30/22 0418  03/30/22 0500  BP: 105/68 105/62 106/64   Pulse: 74 64 77   Resp: '16 16 16   '$ Temp: (!) 97.5 F (36.4 C) (!) 97.5 F (36.4 C) 97.6 F (36.4 C)   TempSrc: Oral Oral    SpO2: 96% 98% 98%   Weight:    48.4 kg  Height:        General: Pt is alert, awake, not in acute distress Cardiovascular: RRR, S1/S2 +, no rubs, no gallops Respiratory: CTA bilaterally, no wheezing, no rhonchi Abdominal: Ostomy noted with appropriate output Extremities: no edema, no cyanosis    The results of significant diagnostics from this hospitalization (including imaging, microbiology, ancillary and laboratory) are listed below for reference.     Microbiology: No results found for this or any previous visit (from the past 240 hour(s)).   Labs: BNP (last 3 results) Recent Labs    12/24/21 0323 03/20/22 1840 03/21/22 0737  BNP 276.8* 286.1* 932.6*   Basic Metabolic Panel: Recent Labs  Lab 03/23/22 1112 03/24/22 0350 03/25/22 0602 03/26/22 1422 03/27/22 0543 03/28/22 0400  NA 143 145 138  --  136 133*  K 2.3* 3.9 4.2  --  4.8 4.5  CL 106 107 105  --  105 100  CO2 '29 31 28  '$ --  24 26  GLUCOSE 231* 139* 149*  --  114* 88  BUN 24* 28* 27*  --  27* 28*  CREATININE 0.45 0.51 0.35*  --  0.39* 0.41*  CALCIUM 8.6* 9.2 8.3*  --  8.5* 8.5*  MG 1.8 2.3 1.9 2.0 2.0  --   PHOS 2.2* 3.1 2.5 2.9 2.8  --    Liver Function Tests: Recent Labs  Lab 03/23/22 0852 03/23/22 1112  AST 21 19  ALT 30 32  ALKPHOS 67 69  BILITOT 0.4 0.3  PROT 4.9* 5.4*  ALBUMIN 2.3* 2.4*   No results for input(s): "LIPASE", "AMYLASE" in the last 168 hours. No results for input(s):  "AMMONIA" in the last 168 hours. CBC: Recent Labs  Lab 03/23/22 0852 03/24/22 0350 03/25/22 0602 03/26/22 1422 03/28/22 0930  WBC 26.2* 18.2* 12.7* 17.1* 20.0*  NEUTROABS  --   --   --   --  16.4*  HGB 9.1* 8.3* 8.2* 10.5* 9.6*  HCT 28.0* 25.4* 25.2* 32.0* 29.9*  MCV 94.9 92.4 92.0 92.5 92.3  PLT 375 336 314 406* 455*   Cardiac Enzymes: No results for input(s): "CKTOTAL", "CKMB", "CKMBINDEX", "TROPONINI" in the last 168 hours. BNP: Invalid input(s): "POCBNP" CBG: Recent Labs  Lab 03/27/22 1650 03/27/22 1959 03/27/22 2346 03/28/22 0024 03/28/22 0359  GLUCAP 75 136* 60* 71 75   D-Dimer No results for input(s): "DDIMER" in the last 72 hours. Hgb A1c No results for input(s): "HGBA1C" in the last 72 hours. Lipid Profile No results for input(s): "CHOL", "HDL", "LDLCALC", "TRIG", "CHOLHDL", "LDLDIRECT" in the last 72 hours. Thyroid function studies No results for input(s): "TSH", "T4TOTAL", "T3FREE", "THYROIDAB" in the last 72 hours.  Invalid input(s): "FREET3" Anemia work up No results for input(s): "VITAMINB12", "FOLATE", "FERRITIN", "TIBC", "IRON", "RETICCTPCT" in the last 72 hours. Urinalysis    Component Value Date/Time   COLORURINE YELLOW 03/06/2022 2100   APPEARANCEUR HAZY (A) 03/06/2022 2100   LABSPEC >1.046 (H) 03/06/2022 2100   PHURINE 5.0 03/06/2022 2100   GLUCOSEU NEGATIVE 03/06/2022 2100   GLUCOSEU NEGATIVE 04/05/2021 1236   HGBUR NEGATIVE 03/06/2022 2100   BILIRUBINUR NEGATIVE 03/06/2022 2100   KETONESUR 20 (A) 03/06/2022 2100  PROTEINUR NEGATIVE 03/06/2022 2100   UROBILINOGEN 0.2 04/05/2021 1236   NITRITE NEGATIVE 03/06/2022 2100   LEUKOCYTESUR NEGATIVE 03/06/2022 2100   Sepsis Labs Recent Labs  Lab 03/24/22 0350 03/25/22 0602 03/26/22 1422 03/28/22 0930  WBC 18.2* 12.7* 17.1* 20.0*   Microbiology No results found for this or any previous visit (from the past 240 hour(s)).   Time coordinating discharge: Over 30  minutes  SIGNED:   Little Ishikawa, DO [facility] 03/30/2022, 7:56 AM Pager   If 7PM-7AM, please contact night-coverage [email or web address]

## 2022-04-02 ENCOUNTER — Other Ambulatory Visit: Payer: Self-pay | Admitting: Internal Medicine

## 2022-04-02 ENCOUNTER — Telehealth: Payer: Self-pay | Admitting: Internal Medicine

## 2022-04-02 ENCOUNTER — Other Ambulatory Visit: Payer: Self-pay | Admitting: Cardiology

## 2022-04-02 ENCOUNTER — Other Ambulatory Visit: Payer: Self-pay

## 2022-04-02 DIAGNOSIS — Z85828 Personal history of other malignant neoplasm of skin: Secondary | ICD-10-CM | POA: Diagnosis not present

## 2022-04-02 DIAGNOSIS — I4891 Unspecified atrial fibrillation: Secondary | ICD-10-CM

## 2022-04-02 DIAGNOSIS — I480 Paroxysmal atrial fibrillation: Secondary | ICD-10-CM

## 2022-04-02 DIAGNOSIS — F0390 Unspecified dementia without behavioral disturbance: Secondary | ICD-10-CM | POA: Diagnosis not present

## 2022-04-02 DIAGNOSIS — Z9181 History of falling: Secondary | ICD-10-CM | POA: Diagnosis not present

## 2022-04-02 DIAGNOSIS — K449 Diaphragmatic hernia without obstruction or gangrene: Secondary | ICD-10-CM | POA: Diagnosis not present

## 2022-04-02 DIAGNOSIS — Z8673 Personal history of transient ischemic attack (TIA), and cerebral infarction without residual deficits: Secondary | ICD-10-CM | POA: Diagnosis not present

## 2022-04-02 DIAGNOSIS — A0471 Enterocolitis due to Clostridium difficile, recurrent: Secondary | ICD-10-CM | POA: Diagnosis not present

## 2022-04-02 DIAGNOSIS — Z7901 Long term (current) use of anticoagulants: Secondary | ICD-10-CM | POA: Diagnosis not present

## 2022-04-02 DIAGNOSIS — E43 Unspecified severe protein-calorie malnutrition: Secondary | ICD-10-CM

## 2022-04-02 DIAGNOSIS — I728 Aneurysm of other specified arteries: Secondary | ICD-10-CM | POA: Diagnosis not present

## 2022-04-02 DIAGNOSIS — I700 Atherosclerosis of aorta: Secondary | ICD-10-CM | POA: Diagnosis not present

## 2022-04-02 DIAGNOSIS — I2510 Atherosclerotic heart disease of native coronary artery without angina pectoris: Secondary | ICD-10-CM | POA: Diagnosis not present

## 2022-04-02 MED ORDER — LORAZEPAM 0.5 MG PO TABS: 0.5000 mg | ORAL_TABLET | Freq: Every evening | ORAL | 2 refills | Status: AC | PRN

## 2022-04-02 MED ORDER — LORAZEPAM 0.5 MG PO TABS: 0.5000 mg | ORAL_TABLET | Freq: Every evening | ORAL | 2 refills | Status: DC | PRN

## 2022-04-02 NOTE — Addendum Note (Signed)
Addended by: Earnstine Regal on: 04/02/2022 08:36 AM   Modules accepted: Orders

## 2022-04-02 NOTE — Progress Notes (Addendum)
Transmission to pharmacy failed. Pls send Lorazepam again...lmb

## 2022-04-02 NOTE — Patient Outreach (Addendum)
Julie Montgomery 1935/07/18 [id number]   Follow up:  Transitioned home  Patient extreme high risk score for unplanned readmission risk with multiple admissions  Plan:  [place] referral request for readmission prevention measures, patient for palliative care follow up noted.  Addendum: 12:30 pm  Received a message from [facility] [employer] office with a request from Hessie Dibble [patient's son listed in contact] requesting a return call.  Call number provided and HIPAA verified.  Son states, he saw the ancillary orders, sent by this Probation officer.  Explained assistance for post hospital care coordination.  [name] was interested in possibly having some additional services for ADLs.  Explained the type of coverage with [facility] and Medicare and explained that private pay could be an option.  Encouraged him to speak with the [facility] RN when assigned for assistance for ongoing coordination care needs.  He states he will and was gracious.   Natividad Brood, RN BSN [place]  [phone] business mobile phone Toll free office [phone]  *[place]  [phone] Fax number: [phone] [email or web address]

## 2022-04-02 NOTE — Progress Notes (Signed)
Rx

## 2022-04-02 NOTE — Telephone Encounter (Signed)
[name] with [facility] needs verbal orders to start Palliative care for this patient. [name] can be reached at [phone]

## 2022-04-02 NOTE — Progress Notes (Signed)
MD printed script faxed to [phone]

## 2022-04-03 ENCOUNTER — Telehealth: Payer: Self-pay | Admitting: *Deleted

## 2022-04-03 NOTE — Telephone Encounter (Signed)
No she is currently on calcium channel blocker.  Joni [place], DO, [facility]

## 2022-04-03 NOTE — Telephone Encounter (Signed)
Should she be on metoprolol?

## 2022-04-03 NOTE — Progress Notes (Signed)
  Care Coordination  Outreach Note  04/03/2022 Name: Julie Montgomery MRN: [id number] DOB: 1935-11-24   Care Coordination Outreach Attempts: An unsuccessful telephone outreach was attempted today to offer the patient information about available care coordination services as a benefit of their health plan.   Referral received   Follow Up Plan:  Additional outreach attempts will be made to offer the patient care coordination information and services.   Encounter Outcome:  No Answer  Julian Hy, [place] Direct Dial: [phone]

## 2022-04-03 NOTE — Telephone Encounter (Signed)
Okay.  Thanks.

## 2022-04-03 NOTE — Telephone Encounter (Signed)
I was able to speak with Julie Montgomery and give there verbal Ok to start Pallative care for the pt Via provider's appproval.

## 2022-04-04 DIAGNOSIS — I2510 Atherosclerotic heart disease of native coronary artery without angina pectoris: Secondary | ICD-10-CM | POA: Diagnosis not present

## 2022-04-04 DIAGNOSIS — A0471 Enterocolitis due to Clostridium difficile, recurrent: Secondary | ICD-10-CM | POA: Diagnosis not present

## 2022-04-04 DIAGNOSIS — I728 Aneurysm of other specified arteries: Secondary | ICD-10-CM | POA: Diagnosis not present

## 2022-04-04 DIAGNOSIS — I700 Atherosclerosis of aorta: Secondary | ICD-10-CM | POA: Diagnosis not present

## 2022-04-04 DIAGNOSIS — F0390 Unspecified dementia without behavioral disturbance: Secondary | ICD-10-CM | POA: Diagnosis not present

## 2022-04-04 DIAGNOSIS — I480 Paroxysmal atrial fibrillation: Secondary | ICD-10-CM | POA: Diagnosis not present

## 2022-04-05 ENCOUNTER — Encounter: Payer: Self-pay | Admitting: Internal Medicine

## 2022-04-05 DIAGNOSIS — F0390 Unspecified dementia without behavioral disturbance: Secondary | ICD-10-CM | POA: Diagnosis not present

## 2022-04-05 DIAGNOSIS — I728 Aneurysm of other specified arteries: Secondary | ICD-10-CM | POA: Diagnosis not present

## 2022-04-05 DIAGNOSIS — I480 Paroxysmal atrial fibrillation: Secondary | ICD-10-CM | POA: Diagnosis not present

## 2022-04-05 DIAGNOSIS — I2510 Atherosclerotic heart disease of native coronary artery without angina pectoris: Secondary | ICD-10-CM | POA: Diagnosis not present

## 2022-04-05 DIAGNOSIS — I700 Atherosclerosis of aorta: Secondary | ICD-10-CM | POA: Diagnosis not present

## 2022-04-05 DIAGNOSIS — A0471 Enterocolitis due to Clostridium difficile, recurrent: Secondary | ICD-10-CM | POA: Diagnosis not present

## 2022-04-06 NOTE — Progress Notes (Signed)
  Care Coordination  Outreach Note  04/06/2022 Name: Julie Montgomery MRN: [id number] DOB: 1935/08/16   Care Coordination Outreach Attempts: A second unsuccessful outreach was attempted today to offer the patient with information about available care coordination services as a benefit of their health plan.     Referral received   Follow Up Plan:  Additional outreach attempts will be made to offer the patient care coordination information and services.   Encounter Outcome:  No Answer  Julian Hy, [place] Direct Dial: [phone]

## 2022-04-08 ENCOUNTER — Other Ambulatory Visit: Payer: Self-pay

## 2022-04-08 ENCOUNTER — Encounter (HOSPITAL_BASED_OUTPATIENT_CLINIC_OR_DEPARTMENT_OTHER): Payer: Self-pay | Admitting: Emergency Medicine

## 2022-04-08 ENCOUNTER — Emergency Department (HOSPITAL_BASED_OUTPATIENT_CLINIC_OR_DEPARTMENT_OTHER): Payer: Medicare Other

## 2022-04-08 ENCOUNTER — Inpatient Hospital Stay (HOSPITAL_BASED_OUTPATIENT_CLINIC_OR_DEPARTMENT_OTHER)
Admission: EM | Admit: 2022-04-08 | Discharge: 2022-04-15 | DRG: 871 | Disposition: A | Discharge status: Hospice | Payer: Medicare Other | Attending: Internal Medicine | Admitting: Internal Medicine

## 2022-04-08 DIAGNOSIS — E861 Hypovolemia: Secondary | ICD-10-CM | POA: Diagnosis present

## 2022-04-08 DIAGNOSIS — L89616 Pressure-induced deep tissue damage of right heel: Secondary | ICD-10-CM | POA: Diagnosis present

## 2022-04-08 DIAGNOSIS — E875 Hyperkalemia: Secondary | ICD-10-CM | POA: Diagnosis present

## 2022-04-08 DIAGNOSIS — Z1152 Encounter for screening for COVID-19: Secondary | ICD-10-CM

## 2022-04-08 DIAGNOSIS — E43 Unspecified severe protein-calorie malnutrition: Secondary | ICD-10-CM | POA: Diagnosis present

## 2022-04-08 DIAGNOSIS — E8809 Other disorders of plasma-protein metabolism, not elsewhere classified: Secondary | ICD-10-CM | POA: Diagnosis present

## 2022-04-08 DIAGNOSIS — J95811 Postprocedural pneumothorax: Secondary | ICD-10-CM | POA: Diagnosis present

## 2022-04-08 DIAGNOSIS — R64 Cachexia: Secondary | ICD-10-CM | POA: Diagnosis present

## 2022-04-08 DIAGNOSIS — J918 Pleural effusion in other conditions classified elsewhere: Secondary | ICD-10-CM | POA: Diagnosis present

## 2022-04-08 DIAGNOSIS — A419 Sepsis, unspecified organism: Principal | ICD-10-CM | POA: Diagnosis present

## 2022-04-08 DIAGNOSIS — Z789 Other specified health status: Secondary | ICD-10-CM | POA: Diagnosis not present

## 2022-04-08 DIAGNOSIS — N390 Urinary tract infection, site not specified: Secondary | ICD-10-CM | POA: Diagnosis present

## 2022-04-08 DIAGNOSIS — Z515 Encounter for palliative care: Secondary | ICD-10-CM

## 2022-04-08 DIAGNOSIS — Z9049 Acquired absence of other specified parts of digestive tract: Secondary | ICD-10-CM

## 2022-04-08 DIAGNOSIS — Y838 Other surgical procedures as the cause of abnormal reaction of the patient, or of later complication, without mention of misadventure at the time of the procedure: Secondary | ICD-10-CM | POA: Diagnosis present

## 2022-04-08 DIAGNOSIS — R609 Edema, unspecified: Secondary | ICD-10-CM

## 2022-04-08 DIAGNOSIS — M7989 Other specified soft tissue disorders: Secondary | ICD-10-CM | POA: Diagnosis not present

## 2022-04-08 DIAGNOSIS — Z66 Do not resuscitate: Secondary | ICD-10-CM | POA: Diagnosis present

## 2022-04-08 DIAGNOSIS — I495 Sick sinus syndrome: Secondary | ICD-10-CM | POA: Diagnosis present

## 2022-04-08 DIAGNOSIS — Z801 Family history of malignant neoplasm of trachea, bronchus and lung: Secondary | ICD-10-CM

## 2022-04-08 DIAGNOSIS — Z79899 Other long term (current) drug therapy: Secondary | ICD-10-CM

## 2022-04-08 DIAGNOSIS — R0789 Other chest pain: Secondary | ICD-10-CM | POA: Diagnosis not present

## 2022-04-08 DIAGNOSIS — R6521 Severe sepsis with septic shock: Secondary | ICD-10-CM | POA: Diagnosis present

## 2022-04-08 DIAGNOSIS — I5032 Chronic diastolic (congestive) heart failure: Secondary | ICD-10-CM | POA: Diagnosis not present

## 2022-04-08 DIAGNOSIS — I509 Heart failure, unspecified: Secondary | ICD-10-CM

## 2022-04-08 DIAGNOSIS — J168 Pneumonia due to other specified infectious organisms: Secondary | ICD-10-CM | POA: Diagnosis not present

## 2022-04-08 DIAGNOSIS — Z882 Allergy status to sulfonamides status: Secondary | ICD-10-CM

## 2022-04-08 DIAGNOSIS — J9811 Atelectasis: Secondary | ICD-10-CM | POA: Diagnosis present

## 2022-04-08 DIAGNOSIS — I5033 Acute on chronic diastolic (congestive) heart failure: Secondary | ICD-10-CM | POA: Diagnosis present

## 2022-04-08 DIAGNOSIS — J189 Pneumonia, unspecified organism: Secondary | ICD-10-CM | POA: Diagnosis present

## 2022-04-08 DIAGNOSIS — I2584 Coronary atherosclerosis due to calcified coronary lesion: Secondary | ICD-10-CM | POA: Diagnosis present

## 2022-04-08 DIAGNOSIS — Z932 Ileostomy status: Secondary | ICD-10-CM | POA: Diagnosis not present

## 2022-04-08 DIAGNOSIS — R7989 Other specified abnormal findings of blood chemistry: Secondary | ICD-10-CM

## 2022-04-08 DIAGNOSIS — L89626 Pressure-induced deep tissue damage of left heel: Secondary | ICD-10-CM | POA: Diagnosis present

## 2022-04-08 DIAGNOSIS — Y95 Nosocomial condition: Secondary | ICD-10-CM | POA: Diagnosis present

## 2022-04-08 DIAGNOSIS — I2510 Atherosclerotic heart disease of native coronary artery without angina pectoris: Secondary | ICD-10-CM | POA: Diagnosis not present

## 2022-04-08 DIAGNOSIS — E871 Hypo-osmolality and hyponatremia: Secondary | ICD-10-CM | POA: Diagnosis present

## 2022-04-08 DIAGNOSIS — J9601 Acute respiratory failure with hypoxia: Secondary | ICD-10-CM | POA: Diagnosis not present

## 2022-04-08 DIAGNOSIS — B952 Enterococcus as the cause of diseases classified elsewhere: Secondary | ICD-10-CM | POA: Diagnosis present

## 2022-04-08 DIAGNOSIS — Z91010 Allergy to peanuts: Secondary | ICD-10-CM | POA: Diagnosis not present

## 2022-04-08 DIAGNOSIS — Z800 Family history of malignant neoplasm of digestive organs: Secondary | ICD-10-CM

## 2022-04-08 DIAGNOSIS — R079 Chest pain, unspecified: Secondary | ICD-10-CM | POA: Diagnosis not present

## 2022-04-08 DIAGNOSIS — Z681 Body mass index (BMI) 19 or less, adult: Secondary | ICD-10-CM

## 2022-04-08 DIAGNOSIS — R531 Weakness: Secondary | ICD-10-CM | POA: Diagnosis not present

## 2022-04-08 DIAGNOSIS — I480 Paroxysmal atrial fibrillation: Secondary | ICD-10-CM | POA: Diagnosis not present

## 2022-04-08 DIAGNOSIS — I5031 Acute diastolic (congestive) heart failure: Secondary | ICD-10-CM | POA: Diagnosis not present

## 2022-04-08 DIAGNOSIS — F419 Anxiety disorder, unspecified: Secondary | ICD-10-CM | POA: Diagnosis present

## 2022-04-08 DIAGNOSIS — I700 Atherosclerosis of aorta: Secondary | ICD-10-CM | POA: Diagnosis present

## 2022-04-08 DIAGNOSIS — Z85828 Personal history of other malignant neoplasm of skin: Secondary | ICD-10-CM

## 2022-04-08 DIAGNOSIS — F0390 Unspecified dementia without behavioral disturbance: Secondary | ICD-10-CM | POA: Diagnosis present

## 2022-04-08 DIAGNOSIS — J90 Pleural effusion, not elsewhere classified: Secondary | ICD-10-CM | POA: Diagnosis not present

## 2022-04-08 DIAGNOSIS — J479 Bronchiectasis, uncomplicated: Secondary | ICD-10-CM | POA: Diagnosis present

## 2022-04-08 DIAGNOSIS — J690 Pneumonitis due to inhalation of food and vomit: Secondary | ICD-10-CM | POA: Diagnosis present

## 2022-04-08 DIAGNOSIS — R4182 Altered mental status, unspecified: Secondary | ICD-10-CM | POA: Diagnosis not present

## 2022-04-08 DIAGNOSIS — E739 Lactose intolerance, unspecified: Secondary | ICD-10-CM | POA: Diagnosis present

## 2022-04-08 DIAGNOSIS — D649 Anemia, unspecified: Secondary | ICD-10-CM | POA: Diagnosis present

## 2022-04-08 DIAGNOSIS — Z792 Long term (current) use of antibiotics: Secondary | ICD-10-CM | POA: Diagnosis not present

## 2022-04-08 DIAGNOSIS — Z7401 Bed confinement status: Secondary | ICD-10-CM

## 2022-04-08 DIAGNOSIS — Z9889 Other specified postprocedural states: Secondary | ICD-10-CM

## 2022-04-08 DIAGNOSIS — E8770 Fluid overload, unspecified: Secondary | ICD-10-CM

## 2022-04-08 DIAGNOSIS — R627 Adult failure to thrive: Secondary | ICD-10-CM | POA: Diagnosis present

## 2022-04-08 DIAGNOSIS — Z711 Person with feared health complaint in whom no diagnosis is made: Secondary | ICD-10-CM | POA: Diagnosis not present

## 2022-04-08 DIAGNOSIS — Z7901 Long term (current) use of anticoagulants: Secondary | ICD-10-CM

## 2022-04-08 DIAGNOSIS — R846 Abnormal cytological findings in specimens from respiratory organs and thorax: Secondary | ICD-10-CM | POA: Diagnosis not present

## 2022-04-08 DIAGNOSIS — Z833 Family history of diabetes mellitus: Secondary | ICD-10-CM

## 2022-04-08 DIAGNOSIS — L89150 Pressure ulcer of sacral region, unstageable: Secondary | ICD-10-CM | POA: Diagnosis present

## 2022-04-08 DIAGNOSIS — R5381 Other malaise: Secondary | ICD-10-CM

## 2022-04-08 DIAGNOSIS — R402421 Glasgow coma scale score 9-12, in the field [EMT or ambulance]: Secondary | ICD-10-CM | POA: Diagnosis not present

## 2022-04-08 DIAGNOSIS — J939 Pneumothorax, unspecified: Secondary | ICD-10-CM | POA: Diagnosis not present

## 2022-04-08 DIAGNOSIS — Z8249 Family history of ischemic heart disease and other diseases of the circulatory system: Secondary | ICD-10-CM

## 2022-04-08 DIAGNOSIS — R404 Transient alteration of awareness: Secondary | ICD-10-CM | POA: Diagnosis not present

## 2022-04-08 DIAGNOSIS — I959 Hypotension, unspecified: Secondary | ICD-10-CM

## 2022-04-08 DIAGNOSIS — Z888 Allergy status to other drugs, medicaments and biological substances status: Secondary | ICD-10-CM

## 2022-04-08 DIAGNOSIS — N281 Cyst of kidney, acquired: Secondary | ICD-10-CM | POA: Diagnosis not present

## 2022-04-08 DIAGNOSIS — Z8673 Personal history of transient ischemic attack (TIA), and cerebral infarction without residual deficits: Secondary | ICD-10-CM

## 2022-04-08 DIAGNOSIS — I4821 Permanent atrial fibrillation: Secondary | ICD-10-CM | POA: Diagnosis present

## 2022-04-08 DIAGNOSIS — E876 Hypokalemia: Secondary | ICD-10-CM | POA: Diagnosis present

## 2022-04-08 DIAGNOSIS — Z4682 Encounter for fitting and adjustment of non-vascular catheter: Secondary | ICD-10-CM | POA: Diagnosis not present

## 2022-04-08 DIAGNOSIS — R0602 Shortness of breath: Secondary | ICD-10-CM | POA: Diagnosis not present

## 2022-04-08 DIAGNOSIS — R739 Hyperglycemia, unspecified: Secondary | ICD-10-CM | POA: Diagnosis present

## 2022-04-08 DIAGNOSIS — Z7189 Other specified counseling: Secondary | ICD-10-CM | POA: Diagnosis not present

## 2022-04-08 DIAGNOSIS — I110 Hypertensive heart disease with heart failure: Secondary | ICD-10-CM | POA: Diagnosis not present

## 2022-04-08 LAB — PROCALCITONIN: Procalcitonin: <0.1 ng/mL

## 2022-04-08 LAB — RESP PANEL BY RT-PCR (RSV, FLU A&B, COVID)  RVPGX2
Influenza A by PCR: NEGATIVE
Influenza B by PCR: NEGATIVE
Resp Syncytial Virus by PCR: NEGATIVE
SARS Coronavirus 2 by RT PCR: NEGATIVE

## 2022-04-08 LAB — URINALYSIS, ROUTINE W REFLEX MICROSCOPIC
Bilirubin Urine: NEGATIVE
Glucose, UA: NEGATIVE mg/dL
Hgb urine dipstick: NEGATIVE
Ketones, ur: NEGATIVE mg/dL
Nitrite: NEGATIVE
Protein, ur: NEGATIVE mg/dL
Specific Gravity, Urine: 1.027 (ref 1.005–1.030)
pH: 5.5 (ref 5.0–8.0)

## 2022-04-08 LAB — CBC WITH DIFFERENTIAL/PLATELET
Abs Immature Granulocytes: 0.03 10*3/uL (ref 0.00–0.07)
Basophils Absolute: 0.1 10*3/uL (ref 0.0–0.1)
Basophils Relative: 1 %
Eosinophils Absolute: 0.1 10*3/uL (ref 0.0–0.5)
Eosinophils Relative: 0 %
HCT: 29.6 % — ABNORMAL LOW (ref 36.0–46.0)
Hemoglobin: 9.5 g/dL — ABNORMAL LOW (ref 12.0–15.0)
Immature Granulocytes: 0 %
Lymphocytes Relative: 9 %
Lymphs Abs: 1 10*3/uL (ref 0.7–4.0)
MCH: 29.2 pg (ref 26.0–34.0)
MCHC: 32.1 g/dL (ref 30.0–36.0)
MCV: 91.1 fL (ref 80.0–100.0)
Monocytes Absolute: 1 10*3/uL (ref 0.1–1.0)
Monocytes Relative: 8 %
Neutro Abs: 9.8 10*3/uL — ABNORMAL HIGH (ref 1.7–7.7)
Neutrophils Relative %: 82 %
Platelets: 531 10*3/uL — ABNORMAL HIGH (ref 150–400)
RBC: 3.25 MIL/uL — ABNORMAL LOW (ref 3.87–5.11)
RDW: 17.7 % — ABNORMAL HIGH (ref 11.5–15.5)
WBC: 12 10*3/uL — ABNORMAL HIGH (ref 4.0–10.5)
nRBC: 0 % (ref 0.0–0.2)

## 2022-04-08 LAB — COMPREHENSIVE METABOLIC PANEL
ALT: 50 U/L — ABNORMAL HIGH (ref 0–44)
AST: 43 U/L — ABNORMAL HIGH (ref 15–41)
Albumin: 3.8 g/dL (ref 3.5–5.0)
Alkaline Phosphatase: 176 U/L — ABNORMAL HIGH (ref 38–126)
Anion gap: 9 (ref 5–15)
BUN: 27 mg/dL — ABNORMAL HIGH (ref 8–23)
CO2: 24 mmol/L (ref 22–32)
Calcium: 10.2 mg/dL (ref 8.9–10.3)
Chloride: 98 mmol/L (ref 98–111)
Creatinine, Ser: 0.48 mg/dL (ref 0.44–1.00)
GFR, Estimated: >60 mL/min (ref >60)
Glucose, Bld: 84 mg/dL (ref 70–99)
Potassium: 5.7 mmol/L — ABNORMAL HIGH (ref 3.5–5.1)
Sodium: 131 mmol/L — ABNORMAL LOW (ref 135–145)
Total Bilirubin: 0.3 mg/dL (ref 0.3–1.2)
Total Protein: 7 g/dL (ref 6.5–8.1)

## 2022-04-08 LAB — BRAIN NATRIURETIC PEPTIDE: B Natriuretic Peptide: 904.3 pg/mL — ABNORMAL HIGH (ref 0.0–100.0)

## 2022-04-08 LAB — CBG MONITORING, ED: Glucose-Capillary: 86 mg/dL (ref 70–99)

## 2022-04-08 LAB — LACTIC ACID, PLASMA
Lactic Acid, Venous: 1.5 mmol/L (ref 0.5–1.9)
Lactic Acid, Venous: 1.1 mmol/L (ref 0.5–1.9)

## 2022-04-08 LAB — PROTIME-INR
INR: 1.1 (ref 0.8–1.2)
Prothrombin Time: 13.9 seconds (ref 11.4–15.2)

## 2022-04-08 LAB — APTT: aPTT: 33 seconds (ref 24–36)

## 2022-04-08 LAB — TROPONIN I (HIGH SENSITIVITY)
Troponin I (High Sensitivity): 8 ng/L (ref <18)
Troponin I (High Sensitivity): 7 ng/L (ref <18)

## 2022-04-08 LAB — LIPASE, BLOOD: Lipase: 30 U/L (ref 11–51)

## 2022-04-08 MED ORDER — SODIUM CHLORIDE 0.9 % IV BOLUS
500.0000 mL | Freq: Once | INTRAVENOUS | Status: AC
  Administered 2022-04-08: 500 mL via INTRAVENOUS

## 2022-04-08 MED ORDER — SODIUM CHLORIDE 0.9 % IV SOLN
500.0000 mg | Freq: Once | INTRAVENOUS | Status: DC
  Filled 2022-04-08: qty 5

## 2022-04-08 MED ORDER — FUROSEMIDE 10 MG/ML IJ SOLN
40.0000 mg | Freq: Once | INTRAMUSCULAR | Status: AC
  Administered 2022-04-08: 40 mg via INTRAVENOUS
  Filled 2022-04-08: qty 4

## 2022-04-08 MED ORDER — IOHEXOL 350 MG/ML SOLN
100.0000 mL | Freq: Once | INTRAVENOUS | Status: AC | PRN
  Administered 2022-04-08: 50 mL via INTRAVENOUS

## 2022-04-08 MED ORDER — SODIUM CHLORIDE 0.9 % IV SOLN
2.0000 g | Freq: Once | INTRAVENOUS | Status: AC
  Administered 2022-04-08: 2 g via INTRAVENOUS
  Filled 2022-04-08: qty 12.5

## 2022-04-08 MED ORDER — ONDANSETRON HCL 4 MG/2ML IJ SOLN
4.0000 mg | Freq: Once | INTRAMUSCULAR | Status: AC
  Administered 2022-04-08: 4 mg via INTRAVENOUS
  Filled 2022-04-08: qty 2

## 2022-04-08 MED ORDER — HYDROMORPHONE HCL 1 MG/ML IJ SOLN
1.0000 mg | Freq: Once | INTRAMUSCULAR | Status: AC
  Administered 2022-04-08: 1 mg via INTRAVENOUS
  Filled 2022-04-08: qty 1

## 2022-04-08 MED ORDER — IOHEXOL 350 MG/ML SOLN
80.0000 mL | Freq: Once | INTRAVENOUS | Status: AC | PRN
  Administered 2022-04-08: 75 mL via INTRAVENOUS

## 2022-04-08 MED ORDER — SODIUM CHLORIDE 0.9 % IV SOLN
1.0000 g | Freq: Once | INTRAVENOUS | Status: DC
  Filled 2022-04-08: qty 10

## 2022-04-08 NOTE — ED Notes (Signed)
Pt to ED from [place]. Critical care team at bedside.  Pt alert and oriented to self, disoriented to time, place and situation. Pt c/o pain in buttocks and back.

## 2022-04-08 NOTE — ED Notes (Signed)
Pt placed on 2 liters N/c for sats of 89% on room air , up to 94 on 2 liters

## 2022-04-08 NOTE — ED Provider Notes (Addendum)
[facility] EMERGENCY DEPT Provider Note   CSN: [id number] Arrival date & time: 04/08/22  1741     History  Chief Complaint  Patient presents with   Shortness of Breath    Julie Montgomery is a 86 y.o. female.   Shortness of Breath Associated symptoms: abdominal pain and cough      86 year old female with recent complicated medical history and prolonged hospitalization from 11/6 to 03/30/2022 with dementia, recurrent C. difficile infection, permanent A-fib on Eliquis, chronic hyponatremia who developed colonic perforation and resection with concern for toxic megacolon status post diverting ileostomy who presents to the emergency department with shortness of breath, productive cough, generalized bodyaches with associated nausea.  She has not been eating well.  She was recently discharged on 12/1.  She has been very lethargic at home.  She has had increasing fluid gain over the last several days with significant pitting edema bilaterally.  She has difficulty lying flat.  She denies any chest pain.  She endorses generalized abdominal pain that is severe.  She has been putting out normal stool from her ostomy.  Home Medications Prior to Admission medications   Medication Sig Start Date End Date Taking? Authorizing Provider  alum & mag hydroxide-simeth (MAALOX/MYLANTA) 200-200-20 MG/5ML suspension Take 30 mLs by mouth every 6 (six) hours as needed for indigestion or heartburn. 03/30/22   Little Ishikawa, MD  amiodarone (PACERONE) 200 MG tablet Take 1 tablet (200 mg total) by mouth daily. 03/30/22   Little Ishikawa, MD  apixaban (ELIQUIS) 2.5 MG TABS tablet Take 1 tablet (2.5 mg total) by mouth 2 (two) times daily. 03/01/22   Plotnikov, Evie Lacks, MD  diltiazem (CARDIZEM CD) 180 MG 24 hr capsule Take 1 capsule (180 mg total) by mouth daily. 03/01/22   Raiford Noble Latif, DO  loperamide (IMODIUM) 2 MG capsule Take 1 capsule (2 mg total) by mouth daily. 03/30/22   Little Ishikawa, MD  LORazepam (ATIVAN) 0.5 MG tablet Take 1 tablet (0.5 mg total) by mouth at bedtime as needed for anxiety or sleep. 04/02/22   Plotnikov, Evie Lacks, MD  memantine (NAMENDA) 5 MG tablet Take 5 mg by mouth 2 (two) times daily. 02/20/22   [provider]  Multiple Vitamins-Minerals (ZINC PO) Take 1 tablet by mouth daily.    [provider]  polycarbophil (FIBERCON) 625 MG tablet Take 2 tablets (1,250 mg total) by mouth daily. 03/30/22   Little Ishikawa, MD  Probiotic Product (ALIGN) 4 MG CAPS Take 1 capsule (4 mg total) by mouth daily. 12/26/21   Plotnikov, Evie Lacks, MD  saccharomyces boulardii (FLORASTOR) 250 MG capsule Take 1 capsule (250 mg total) by mouth 2 (two) times daily. 03/30/22 04/29/22  Little Ishikawa, MD  vancomycin (VANCOCIN) 125 MG capsule Take 1 capsule (125 mg total) by mouth 2 (two) times daily for 12 days. 03/30/22 04/11/22  Little Ishikawa, MD  Vitamin D, Ergocalciferol, (DRISDOL) 1.25 MG (50000 UNIT) CAPS capsule Take 1 capsule (50,000 Units total) by mouth every 7 (seven) days. 04/05/22   Little Ishikawa, MD      Allergies    Lactose intolerance (gi), Lovastatin, Mometasone furo-formoterol fum, Peanut-containing drug products, Prozac [fluoxetine hcl], and Sulfa antibiotics    Review of Systems   Review of Systems  Respiratory:  Positive for cough and shortness of breath.   Gastrointestinal:  Positive for abdominal pain and nausea.  Musculoskeletal:  Positive for myalgias.  All other systems reviewed and are  negative.   Physical Exam Updated Vital Signs BP (!) 118/57   Pulse (!) 51   Temp (!) 97.5 F (36.4 C) (Oral)   Resp 15   SpO2 97%  Physical Exam Vitals and nursing note reviewed.  Constitutional:      General: She is not in acute distress.    Appearance: She is ill-appearing.  HENT:     Head: Normocephalic and atraumatic.  Eyes:     Conjunctiva/sclera: Conjunctivae normal.  Cardiovascular:     Rate and  Rhythm: Bradycardia present. Rhythm irregular.     Pulses: Normal pulses.     Comments: Slow atrial fibrillation noted on cardiac telemetry Pulmonary:     Effort: Tachypnea present. No respiratory distress.     Breath sounds: Rhonchi and rales present.  Abdominal:     Palpations: Abdomen is soft.     Tenderness: There is generalized abdominal tenderness. There is guarding.     Comments: Ileostomy in place with severe generalized tenderness to palpation surrounding the ostomy, stool in the ostomy bag  Musculoskeletal:        General: Swelling present.     Cervical back: Neck supple.     Right lower leg: Edema present.     Left lower leg: Edema present.     Comments: Significant 3+ bilateral pitting edema to the level of the knee  Skin:    General: Skin is warm and dry.     Capillary Refill: Capillary refill takes less than 2 seconds.  Neurological:     Mental Status: She is alert.  Psychiatric:        Mood and Affect: Mood normal.     ED Results / Procedures / Treatments   Labs (all labs ordered are listed, but only abnormal results are displayed) Labs Reviewed  COMPREHENSIVE METABOLIC PANEL - Abnormal; Notable for the following components:      Result Value   Sodium 131 (*)    Potassium 5.7 (*)    BUN 27 (*)    AST 43 (*)    ALT 50 (*)    Alkaline Phosphatase 176 (*)    All other components within normal limits  CBC WITH DIFFERENTIAL/PLATELET - Abnormal; Notable for the following components:   WBC 12.0 (*)    RBC 3.25 (*)    Hemoglobin 9.5 (*)    HCT 29.6 (*)    RDW 17.7 (*)    Platelets 531 (*)    Neutro Abs 9.8 (*)    All other components within normal limits  URINALYSIS, ROUTINE W REFLEX MICROSCOPIC - Abnormal; Notable for the following components:   Leukocytes,Ua MODERATE (*)    Bacteria, UA RARE (*)    All other components within normal limits  BRAIN NATRIURETIC PEPTIDE - Abnormal; Notable for the following components:   B Natriuretic Peptide 904.3 (*)     All other components within normal limits  RESP PANEL BY RT-PCR (RSV, FLU A&B, COVID)  RVPGX2  CULTURE, BLOOD (ROUTINE X 2)  CULTURE, BLOOD (ROUTINE X 2)  URINE CULTURE  LACTIC ACID, PLASMA  LACTIC ACID, PLASMA  PROTIME-INR  APTT  LIPASE, BLOOD  PROCALCITONIN  PROCALCITONIN  CBG MONITORING, ED  TROPONIN I (HIGH SENSITIVITY)  TROPONIN I (HIGH SENSITIVITY)    EKG EKG Interpretation  Date/Time:  Sunday April 08 2022 18:20:23 EST Ventricular Rate:  48 PR Interval:    QRS Duration: 95 QT Interval:  470 QTC Calculation: 420 R Axis:   80 Text Interpretation: Slow atrial fibrillation Reconfirmed  by Regan Lemming [phone]) on 04/08/2022 9:36:47 PM  Radiology CT Angio Chest PE W and/or Wo Contrast  Result Date: 04/08/2022 CLINICAL DATA:  Pulmonary embolism (PE) suspected, high prob Generalized body aches and shortness of breath. Nausea. EXAM: CT ANGIOGRAPHY CHEST WITH CONTRAST TECHNIQUE: Multidetector CT imaging of the chest was performed using the standard protocol during bolus administration of intravenous contrast. Multiplanar CT image reconstructions and MIPs were obtained to evaluate the vascular anatomy. RADIATION DOSE REDUCTION: This exam was performed according to the departmental dose-optimization program which includes automated exposure control, adjustment of the mA and/or kV according to patient size and/or use of iterative reconstruction technique. CONTRAST:  28m OMNIPAQUE IOHEXOL 350 MG/ML SOLN COMPARISON:  Chest radiograph earlier today. Noted portions from abdominopelvic CT earlier today. Chest CTA 12/24/2021 FINDINGS: Cardiovascular: There are no filling defects within the pulmonary arteries to suggest pulmonary embolus. Calcified aortic atherosclerosis. Stable aortic tortuosity. There is no contrast in the aorta. Cardiomegaly with contrast refluxing into the hepatic veins and IVC. No pericardial effusion. Mediastinum/Nodes: 11 mm right hilar node. There are shotty  mediastinal lymph nodes. Patulous esophagus. Lungs/Pleura: Moderate bilateral pleural effusions. There is associated compressive atelectasis. Additionally non dependent consolidation in the right lower lobe is atypical for passive atelectasis. Nodular and geographic ground-glass airspace disease in the right upper and middle and left upper lobes. No endobronchial lesions or debris. Calcified granuloma in the lung bases. Upper Abdomen: Assessed on abdominal CT earlier today. No new findings. Musculoskeletal: Generalized body wall edema. There are no acute or suspicious osseous abnormalities. Review of the MIP images confirms the above findings. IMPRESSION: 1. No pulmonary embolus. 2. Moderate bilateral pleural effusions with associated compressive atelectasis. Additionally non dependent consolidation in the right lower lobe is atypical for passive atelectasis, and suspicious for superimposed pneumonia. 3. Nodular and geographic ground-glass airspace disease in the right upper and middle and left upper lobes, likely infectious or inflammatory in etiology. Pulmonary edema is considered but felt much less likely. 4. Cardiomegaly with contrast refluxing into the hepatic veins and IVC consistent with elevated right heart pressures. Generalized body wall edema/anasarca. Aortic Atherosclerosis (ICD10-I70.0). Electronically Signed   By: MKeith RakeM.D.   On: 04/08/2022 21:53   CT ABDOMEN PELVIS W CONTRAST  Result Date: 04/08/2022 CLINICAL DATA:  Acute abdominal pain EXAM: CT ABDOMEN AND PELVIS WITH CONTRAST TECHNIQUE: Multidetector CT imaging of the abdomen and pelvis was performed using the standard protocol following bolus administration of intravenous contrast. RADIATION DOSE REDUCTION: This exam was performed according to the departmental dose-optimization program which includes automated exposure control, adjustment of the mA and/or kV according to patient size and/or use of iterative reconstruction  technique. CONTRAST:  754mOMNIPAQUE IOHEXOL 350 MG/ML SOLN COMPARISON:  03/05/2022 FINDINGS: Lower chest: Lung bases demonstrate bilateral large pleural effusions with associated lower lobe consolidation worse on the right than the left. These changes have increased in the interval from the prior exam. Calcified granuloma is again seen in the right middle lobe. Hepatobiliary: Gallbladder has been surgically removed. Liver shows no focal mass lesion. Pancreas: Unremarkable. No pancreatic ductal dilatation or surrounding inflammatory changes. Spleen: Normal in size without focal abnormality. Adrenals/Urinary Tract: Adrenal glands are within normal limits bilaterally. Kidneys demonstrate a normal enhancement pattern. Stable left upper pole simple cyst is noted. No follow-up is recommended. No renal calculi or obstructive changes are seen. The bladder is well distended. Stomach/Bowel: Changes consistent with a decompressed Hartmann pouch are now seen. Right-sided end ileostomy is noted. Changes consistent with  the subtotal colectomy are noted. Stomach is decompressed. The visualized small bowel shows no obstructive changes. Small bowel fluid is noted throughout. Vascular/Lymphatic: Aortic atherosclerosis. No enlarged abdominal or pelvic lymph nodes. 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 No findings to suggest mesenteric ischemia are noted. Reproductive: Status post hysterectomy. No adnexal masses. Other: No significant free pelvic fluid is noted. Mild changes of anasarca are seen in the lateral abdominal wall. Musculoskeletal: Degenerative changes of lumbar spine are noted. IMPRESSION: Increase in large bilateral pleural effusions with lower lobe consolidation right considerably greater than left. Interval changes consistent with subtotal colectomy and right-sided end ileostomy. No obstructive changes are seen. Electronically Signed   By: Inez Catalina M.D.   On:  04/08/2022 20:03   DG Chest Port 1 View  Result Date: 04/08/2022 CLINICAL DATA:  Shortness of breath EXAM: PORTABLE CHEST 1 VIEW COMPARISON:  03/23/2022 FINDINGS: Cardiac shadow is stable. Aortic calcifications are again noted. Chronic pleural effusions are noted stable from the prior study. Skin folds are noted over the left chest. Some patchy airspace opacity is noted in the right lung base adjacent to the effusion. No acute bony abnormality is seen. IMPRESSION: Chronic bilateral effusions. Patchy right basilar airspace opacity. Electronically Signed   By: Inez Catalina M.D.   On: 04/08/2022 19:00    Procedures Ultrasound ED Echo  Date/Time: 04/08/2022 9:50 PM  Performed by: Regan Lemming, MD Authorized by: Regan Lemming, MD   Procedure details:    Indications: hypotension and dyspnea     Views: subxiphoid, parasternal long axis view, parasternal short axis view and IVC view     Images: not archived   Findings:    Pericardium: no pericardial effusion     IVC: normal   Impression:    Impression comment:  Euvolemia, pericardial no effusion .Critical Care  Performed by: Regan Lemming, MD Authorized by: Regan Lemming, MD   Critical care provider statement:    Critical care time (minutes):  30   Critical care was necessary to treat or prevent imminent or life-threatening deterioration of the following conditions:  Respiratory failure   Critical care was time spent personally by me on the following activities:  Development of treatment plan with patient or surrogate, discussions with consultants, evaluation of patient's response to treatment, examination of patient, ordering and review of laboratory studies, ordering and review of radiographic studies, ordering and performing treatments and interventions, pulse oximetry, re-evaluation of patient's condition and review of old charts   Care discussed with: accepting provider at another facility       Medications Ordered in  ED Medications  HYDROmorphone (DILAUDID) injection 1 mg (1 mg Intravenous Given 04/08/22 1957)  ondansetron (ZOFRAN) injection 4 mg (4 mg Intravenous Given 04/08/22 1956)  furosemide (LASIX) injection 40 mg (40 mg Intravenous Given 04/08/22 1956)  iohexol (OMNIPAQUE) 350 MG/ML injection 80 mL (75 mLs Intravenous Contrast Given 04/08/22 1937)  iohexol (OMNIPAQUE) 350 MG/ML injection 100 mL (50 mLs Intravenous Contrast Given 04/08/22 2131)  ceFEPIme (MAXIPIME) 2 g in sodium chloride 0.9 % 100 mL IVPB (0 g Intravenous Stopped 04/08/22 2352)  sodium chloride 0.9 % bolus 500 mL (500 mLs Intravenous New Bag/Given 04/08/22 2338)    ED Course/ Medical Decision Making/ A&P Clinical Course as of 04/08/22 2354  Sun Apr 08, 2022  2131 B Natriuretic Peptide(!): 904.3 [JL]    Clinical Course User Index [JL] Regan Lemming, MD  Medical Decision Making Amount and/or Complexity of Data Reviewed Labs: ordered. Decision-making details documented in ED Course. Radiology: ordered.  Risk Prescription drug management. Decision regarding hospitalization.    86 year old female with recent complicated medical history and prolonged hospitalization from 11/6 to 03/30/2022 with dementia, recurrent C. difficile infection, permanent A-fib on Eliquis, chronic hyponatremia who developed colonic perforation and resection with concern for toxic megacolon status post diverting ileostomy who presents to the emergency department with shortness of breath, productive cough, generalized bodyaches with associated nausea.  She has not been eating well.  She was recently discharged on 12/1.  She has been very lethargic at home.  She has had increasing fluid gain over the last several days with significant pitting edema bilaterally.  She has difficulty lying flat.  She denies any chest pain.  She endorses generalized abdominal pain that is severe.  She has been putting out normal stool from her  ostomy.  On arrival, the patient was afebrile, bradycardic P46, tachypneic RR 22, BP 105/56, saturating 97%, subsequently desaturated to 80% requiring oxygen.  Slow atrial fibrillation was noted on cardiac telemetry.  The patient physical exam was concerning for generalized abdominal tenderness with guarding surrounding the patient's ostomy, rhonchi and rails with significant 3+ pitting edema bilaterally.  She was ill-appearing.  An irregular rhythm was noted on telemetry which appeared to be atrial fibrillation with bradycardia noted.  Concern for CHF exacerbation, PE, pleural effusions, pulmonary edema, sepsis from pneumonia, COVID-19, influenza.  Considered ACS.  The patient's most recent echocardiogram was on 03/22/2022 which revealed a normal EF of 60 to 65% with no wall motion abnormalities and diastolic dysfunction grade 1.  She has had significant pitting edema that has developed since then.  She endorses a productive cough.  An EKG was performed which reveals  slow atrial fibrillation, bradycardia ventricular rate 4 8.  No acute ischemic changes.  A chest x-ray revealed chronic bilateral pleural effusions with patchy right basilar airspace opacity.  Given the patient's severe abdominal pain on exam, CT abdomen pelvis was performed which revealed the following: IMPRESSION:  Increase in large bilateral pleural effusions with lower lobe  consolidation right considerably greater than left.    Interval changes consistent with subtotal colectomy and right-sided  end ileostomy. No obstructive changes are seen.    The patient appears to have third spacing with significant large bilateral pleural effusions with lower lobe consolidation right greater than left.  This could be compressive atelectasis however pneumonia cannot be ruled out.  The patient appears to have a chronic leukocytosis which has been downtrending.  His CBC revealed a leukocytosis to 12.0, anemia stable to 9.5, BNP was elevated to  904, CMP with hyperkalemia to 5.7, mildly elevated LFTs to 43 and 50 AST/ALT, elevated alkaline phosphatase to 176.  COVID-19, influenza, or RSV PCR testing was collected and resulted negative.  A troponin resulted normal at 8 and initial lactic acid was also normal at 1.5.  Lipase was normal as well.  A procalcitonin was collected and pending.  I discussed the patient's care with on-call infectious disease, Dr. Dietrich Pates [place] who recommended holding antibiotics given the patient's history of toxic megacolon and perforation.  Bedside point-of-care ultrasound was performed which revealed euvolemia with neither a plethoric nor significantly collapsed IVC, no evidence of pericardial effusion.  CTA PE: IMPRESSION:  1. No pulmonary embolus.  2. Moderate bilateral pleural effusions with associated compressive  atelectasis. Additionally non dependent consolidation in the right  lower lobe is atypical  for passive atelectasis, and suspicious for  superimposed pneumonia.  3. Nodular and geographic ground-glass airspace disease in the right  upper and middle and left upper lobes, likely infectious or  inflammatory in etiology. Pulmonary edema is considered but felt  much less likely.  4. Cardiomegaly with contrast refluxing into the hepatic veins and  IVC consistent with elevated right heart pressures. Generalized body  wall edema/anasarca.    Aortic Atherosclerosis (ICD10-I70.0).    I have concern for significant third spacing with potentially developing cardiogenic shock.  Low concern for ACS given normal troponin and lack of chest pain.  The patient was administered 40 mg of IV Lasix due to significant edema and pleural effusions.  Her blood pressures were trending hypotensive but she did not yet require pressors.  Due to my concerns, I did consult Dr. Silas Flood of on-call pulmonary critical care who recommended ED to ED transfer for evaluation in person for determination of need for ICU level of care.   I did discuss the care of the patient with Dr. Shellia Carwin of on-call cardiology who will see the patient in consultation, agreed with plan for diuresis initially.  Dr. Melina Copa of the [facility] emergency department accepted the patient in ED to ED transfer for evaluation.  Family was updated bedside along with the patient.  Hemodynamically stable at time of transfer.    Final Clinical Impression(s) / ED Diagnoses Final diagnoses:  Acute respiratory failure with hypoxia (HCC)  Pleural effusion  Hypervolemia, unspecified hypervolemia type  Edema, unspecified type  Acute on chronic congestive heart failure, unspecified heart failure type [facility])    Rx / DC Orders ED Discharge Orders     None           Regan Lemming, MD 04/08/22 2354

## 2022-04-08 NOTE — ED Notes (Signed)
Pt c/o pain from decubitus. Pt propped up onto right side to relieve pressure.

## 2022-04-08 NOTE — ED Notes (Signed)
Report to Rozann Lesches charge RN

## 2022-04-08 NOTE — ED Provider Notes (Signed)
  Provider Note MRN:  [id number]  Arrival date & time: 04/08/22    ED Course and Medical Decision Making  Assumed care from Dr. Armandina Gemma upon patient transfer.  New hypoxia, also has bradycardia and soft blood pressures.  Patient sent here for critical care evaluation, they feel she is stable enough for stepdown.  Blood pressures soft but she seems well-perfused, lactate normal.  Looks like she has pneumonia on her imaging.  I had a discussion about antibiotics with the patient, we discussed pros and cons and she agrees to antibiotics.  She has lower extremity edema but I suspect she is intravascularly dry, providing a small fluid bolus.  Will admit to stepdown.  .Critical Care  Performed by: Maudie Flakes, MD Authorized by: Maudie Flakes, MD   Critical care provider statement:    Critical care time (minutes):  35   Critical care was necessary to treat or prevent imminent or life-threatening deterioration of the following conditions:  Respiratory failure   Critical care was time spent personally by me on the following activities:  Development of treatment plan with patient or surrogate, discussions with consultants, evaluation of patient's response to treatment, examination of patient, ordering and review of laboratory studies, ordering and review of radiographic studies, ordering and performing treatments and interventions, pulse oximetry, re-evaluation of patient's condition and review of old charts   Final Clinical Impressions(s) / ED Diagnoses     ICD-10-CM   1. Acute respiratory failure with hypoxia (HCC)  J96.01     2. Pleural effusion  J90     3. Hypervolemia, unspecified hypervolemia type  E87.70     4. Edema, unspecified type  R60.9     5. Acute on chronic congestive heart failure, unspecified heart failure type [facility])  I50.9       ED Discharge Orders     None       Discharge Instructions   None     Barth Kirks. Sedonia Small, MD [facility] [email or web address]    [facility], MD 04/08/22 [phone]

## 2022-04-08 NOTE — ED Notes (Signed)
Call placed to lab to add BNP and troponin to prior collection

## 2022-04-08 NOTE — Consult Note (Signed)
NAME:  [name], MRN:  [id number], DOB:  16-Jul-1935, LOS: 0 ADMISSION DATE:  04/08/2022, CONSULTATION DATE:  12/10 REFERRING MD:  Armandina Gemma, CHIEF COMPLAINT:  SOB, bodyaches, nausea   History of Present Illness:  Julie Montgomery is an 86 y.o. female with a pertinent past medical history of dementia, recurrent C. difficile infection, permanent A-fib on Eliquis, chronic hyponatremia, failure to thrive, stroke, H. pylori, abdominal perforation with subtotal colectomy and end ileostomy (03/21/2022).  She been hospitalized 5 times in 2023.  She presented to [facility] on 12/10 with a chief complaint of shortness of breath, general body aches, nausea, more lethargic per family.  Patient discharged to skilled nursing facility on 12/1, unclear of return to home.  Patient with documented SpO2 of 80% on room air, WBC 12.0, BNP 904, patient with new bradycardia (44-48), K 5.7. She was given 61m of lasix by the [facility]P. [facility].  PCCM consulted for evaluation of effusions. Recommended ED to ED transfer.  [facility] patient was alert.  Interactive.  Seems pleasantly demented.  Disoriented at times.  Inattentive at times.  Pain diminished breath sounds in the bases.  Bradycardic on the monitor.  Warm.  Satting 100% on 2 L.  Blood pressure 90 over 70s.  About what is expected in a cachectic 86year old with appears to be chronic malnutrition.  I placed her on room air at time of evaluation.  Reviewed CTA PE protocol obtained in the interim prior to transfer though my review interpretation shows moderate bilateral effusions right greater than left, left appears loculated, with right lower lobe infiltrate that is not immediately adjacent to the pleural effusion, normal parenchyma between pleural effusion and infiltrate, based on imaging findings favored to represent pneumonia versus aspirated contents.  [place]orted[place].  Pertinent  Medical History  dementia, recurrent C. difficile infection, permanent A-fib on Eliquis, chronic hyponatremia, failure to thrive, stroke, H. pylori, abdominal perforation with subtotal colectomy and end ileostomy (03/21/2022)  Significant Hospital Events: Including procedures, antibiotic start and stop dates in addition to other pertinent events   12/10 presented to [facility]  Interim History / Subjective:    Objective   Blood pressure 95/76, pulse (!) 46, temperature 98.4 F (36.9 C), temperature source Rectal, resp. rate 15, SpO2 97 %.       No intake or output data in the 24 hours ending 04/08/22 2126 There were no vitals filed for this visit.  Examination: General: Frail, cachectic, temporal wasting, chronically ill-appearing Lungs: Diminished in the bases, normal work of breathing Cardiovascular: Bradycardic, warm Abdomen: Ostomy with dark stool output, nontender Neuro: Alert, oriented to time not place, inattentive, no focal deficits  Lab/imaging COVID flu negative UA pending Blood cultures pending Lactic acid 1.5 Sodium 131 Potassium 5.7 AST 43, ALT 50, alk phos 176 WBC 12 Hemoglobin 9.5 Platelets 531 Troponin with normal limits BNP 904 (117 2 weeks ago) Images personally reviewed and interpreted as: Chest x-ray: No pneumothorax, chronic bilateral effusions, patchy right basilar airspace opacity, possible aspiration CT abdomen pelvis- per rads: Increasing large bilateral pleural effusions with lower lobe consolidation, right considerably greater than left.  Endometrial changes consistent with subtotal colectomy and right-sided end ileostomy.  No obstructive changes seen. CTA PE: Right greater than left effusions, left loculated, right lower lobe infiltrate favored to represent pneumonia versus aspiration based on imaging characteristics, compressive atelectasis is on the differential  Resolved Hospital  Problem list     Assessment & Plan:   Sepsis: Patient with lethargy, wbc 12.0, bradycardic, recent dc from SNF, possible aspiration on imaging. CT abd neg for abdominal source. Patient with poor po intake at home. -Goal MAP 60 -Start ceftriaxone, azithromycin for CAP, Follow up blood cultures, UA. Check MRSA PCR, trach asp. -consider ECHO, had once recently 02/2022  Acute respiratory failure with hypoxia Chronic pleural effusion Spo2 89% on room air in ED . CXR and CT with chronic pleural effusions with right lower lobe infiltrate.  Etiology of pleural effusions favored to be related to recent prolonged hospital stay related to administration of IV fluids, severe malnutrition and hypoalbuminemia leading to third spacing, increase salt load with IV fluids lead to fluid retention.  She had bilateral pleural effusions on most recent chest imaging prior to discharge last month. -Goal SPO2 > 88%. -Holding on thora at this time, pleural effusions likely related to chronic volume overload  Bradycardia Atrial fibrillation on apixaban BNP elevation -Appreciate cardiology input -Continue apixaban 2.5 mg bid -Hold dilt, continue PO amio  Hyperkalemia Chronic hyponatremia -trend  Right-sided end ileostomy secondary to acute intestinal perforation secondary to C. difficile toxic megacolon on 11/22 -WOC consult -Continue PO vanc 125 MG BId  Dementia Hx Stroke -fall precautions -Continue namenda 30m bid  Failure to thrive Five hospital admissions this year -AM palliative consult -Poor overall prognosis  PCCM will sign off  Best Practice (right click and "Reselect all SmartList Selections" daily)   Per Primary  Labs   CBC: Recent Labs  Lab 04/08/22 1825  WBC 12.0*  NEUTROABS 9.8*  HGB 9.5*  HCT 29.6*  MCV 91.1  PLT 531*    Basic Metabolic Panel: Recent Labs  Lab 04/08/22 1825  NA 131*  K 5.7*  CL 98  CO2 24  GLUCOSE 84  BUN 27*  CREATININE 0.48  CALCIUM 10.2   GFR: Estimated Creatinine Clearance:  40 mL/min (by C-G formula based on SCr of 0.48 mg/dL). Recent Labs  Lab 04/08/22 1825  WBC 12.0*  LATICACIDVEN 1.5    Liver Function Tests: Recent Labs  Lab 04/08/22 1825  AST 43*  ALT 50*  ALKPHOS 176*  BILITOT 0.3  PROT 7.0  ALBUMIN 3.8   Recent Labs  Lab 04/08/22 1825  LIPASE 30   No results for input(s): "AMMONIA" in the last 168 hours.  ABG    Component Value Date/Time   PHART 7.304 (L) 03/21/2022 0635   PCO2ART 58.2 (H) 03/21/2022 0635   PO2ART 134 (H) 03/21/2022 0635   HCO3 28.9 (H) 03/21/2022 0635   TCO2 31 03/21/2022 0635   O2SAT 99 03/21/2022 0635     Coagulation Profile: Recent Labs  Lab 04/08/22 1825  INR 1.1    Cardiac Enzymes: No results for input(s): "CKTOTAL", "CKMB", "CKMBINDEX", "TROPONINI" in the last 168 hours.  HbA1C: Hgb A1c MFr Bld  Date/Time Value Ref Range Status  03/12/2022 05:14 AM 5.4 4.8 - 5.6 % Final    Comment:    (NOTE) Pre diabetes:          5.7%-6.4%  Diabetes:              >6.4%  Glycemic control for   <7.0% adults with diabetes     CBG: Recent Labs  Lab 04/08/22 1757  GLUCAP 86    Review of Systems:   Unable to obtain due to underlying dementia Past Medical History:  She,  has a past medical history of Adenomatous polyp  of colon (2020), Allergy, Anxiety, Basal cell carcinoma (BCC), Cataract, Depression, Helicobacter pylori gastritis, Osteopenia, Paroxysmal atrial fibrillation ([place]), Pneumonia, and Stroke ([place]).   Surgical History:   Past Surgical History:  Procedure Laterality Date   APPENDECTOMY     BIOPSY  03/15/2022   Procedure: BIOPSY;  Surgeon: Jerene Bears, MD;  Location: [facility];  Service: Gastroenterology;;   BREAST LUMPECTOMY     right   CATARACT EXTRACTION W/ INTRAOCULAR LENS  IMPLANT, BILATERAL Bilateral    CESAREAN SECTION     CHOLECYSTECTOMY     FLEXIBLE SIGMOIDOSCOPY N/A 03/15/2022   Procedure: FLEXIBLE SIGMOIDOSCOPY;  Surgeon: Jerene Bears, MD;  Location: [facility];   Service: Gastroenterology;  Laterality: N/A;   LAPAROTOMY N/A 03/21/2022   Procedure: EXPLORATORY LAPAROTOMY,subtotal colectomy with end ileostomy;  Surgeon: Stechschulte, Nickola Major, MD;  Location: [place];  Service: General;  Laterality: N/A;   MOHS SURGERY     PARTIAL HYSTERECTOMY     ROTATOR CUFF REPAIR  2012   SKIN GRAFT     teeth remmoved       Social History:   reports that she has never smoked. She has never used smokeless tobacco. She reports that she does not drink alcohol and does not use drugs.   Family History:  Her family history includes Cancer in her brother and sister; Colon cancer in her son; Coronary artery disease in her brother; Dementia in her brother; Diabetes in her sister; Stomach cancer in her paternal grandmother. There is no history of Breast cancer, Esophageal cancer, or Rectal cancer.   Allergies Allergies  Allergen Reactions   Lactose Intolerance (Gi) Other (See Comments)    Intolerance    Lovastatin Other (See Comments)    Unknown reaction   Mometasone Furo-Formoterol Fum Other (See Comments)    Loss of appetite, laryngitis    Peanut-Containing Drug Products Other (See Comments)   Prozac [Fluoxetine Hcl] Other (See Comments)    Jumpy   Sulfa Antibiotics Other (See Comments)    Unknown reaction     Home Medications  Prior to Admission medications   Medication Sig Start Date End Date Taking? Authorizing Provider  alum & mag hydroxide-simeth (MAALOX/MYLANTA) 200-200-20 MG/5ML suspension Take 30 mLs by mouth every 6 (six) hours as needed for indigestion or heartburn. 03/30/22   Little Ishikawa, MD  amiodarone (PACERONE) 200 MG tablet Take 1 tablet (200 mg total) by mouth daily. 03/30/22   Little Ishikawa, MD  apixaban (ELIQUIS) 2.5 MG TABS tablet Take 1 tablet (2.5 mg total) by mouth 2 (two) times daily. 03/01/22   Plotnikov, Evie Lacks, MD  diltiazem (CARDIZEM CD) 180 MG 24 hr capsule Take 1 capsule (180 mg total) by mouth daily. 03/01/22   Raiford Noble Latif, DO  loperamide (IMODIUM) 2 MG capsule Take 1 capsule (2 mg total) by mouth daily. 03/30/22   Little Ishikawa, MD  LORazepam (ATIVAN) 0.5 MG tablet Take 1 tablet (0.5 mg total) by mouth at bedtime as needed for anxiety or sleep. 04/02/22   Plotnikov, Evie Lacks, MD  memantine (NAMENDA) 5 MG tablet Take 5 mg by mouth 2 (two) times daily. 02/20/22   [provider]  Multiple Vitamins-Minerals (ZINC PO) Take 1 tablet by mouth daily.    [provider]  polycarbophil (FIBERCON) 625 MG tablet Take 2 tablets (1,250 mg total) by mouth daily. 03/30/22   Little Ishikawa, MD  Probiotic Product (ALIGN) 4 MG CAPS Take 1 capsule (4 mg total) by  mouth daily. 12/26/21   Plotnikov, Evie Lacks, MD  saccharomyces boulardii (FLORASTOR) 250 MG capsule Take 1 capsule (250 mg total) by mouth 2 (two) times daily. 03/30/22 04/29/22  Little Ishikawa, MD  vancomycin (VANCOCIN) 125 MG capsule Take 1 capsule (125 mg total) by mouth 2 (two) times daily for 12 days. 03/30/22 04/11/22  Little Ishikawa, MD  Vitamin D, Ergocalciferol, (DRISDOL) 1.25 MG (50000 UNIT) CAPS capsule Take 1 capsule (50,000 Units total) by mouth every 7 (seven) days. 04/05/22   Little Ishikawa, MD     Critical care time: n/a    Lanier Clam, MD [facility]  I spent 80 minutes in care of patient

## 2022-04-08 NOTE — ED Triage Notes (Signed)
Patient recently d/c  No has general body aches and sob started yesterday. Nausea present. Not eating well. More lethargic per family

## 2022-04-09 ENCOUNTER — Inpatient Hospital Stay (HOSPITAL_COMMUNITY): Payer: Medicare Other

## 2022-04-09 ENCOUNTER — Inpatient Hospital Stay: Payer: Self-pay

## 2022-04-09 DIAGNOSIS — E871 Hypo-osmolality and hyponatremia: Secondary | ICD-10-CM | POA: Diagnosis present

## 2022-04-09 DIAGNOSIS — J939 Pneumothorax, unspecified: Secondary | ICD-10-CM | POA: Diagnosis not present

## 2022-04-09 DIAGNOSIS — R0789 Other chest pain: Secondary | ICD-10-CM | POA: Diagnosis present

## 2022-04-09 DIAGNOSIS — J479 Bronchiectasis, uncomplicated: Secondary | ICD-10-CM | POA: Diagnosis present

## 2022-04-09 DIAGNOSIS — R5381 Other malaise: Secondary | ICD-10-CM | POA: Diagnosis not present

## 2022-04-09 DIAGNOSIS — I700 Atherosclerosis of aorta: Secondary | ICD-10-CM | POA: Diagnosis present

## 2022-04-09 DIAGNOSIS — A419 Sepsis, unspecified organism: Secondary | ICD-10-CM | POA: Diagnosis present

## 2022-04-09 DIAGNOSIS — J189 Pneumonia, unspecified organism: Secondary | ICD-10-CM | POA: Diagnosis not present

## 2022-04-09 DIAGNOSIS — E43 Unspecified severe protein-calorie malnutrition: Secondary | ICD-10-CM | POA: Diagnosis present

## 2022-04-09 DIAGNOSIS — J9601 Acute respiratory failure with hypoxia: Secondary | ICD-10-CM | POA: Diagnosis present

## 2022-04-09 DIAGNOSIS — I5033 Acute on chronic diastolic (congestive) heart failure: Secondary | ICD-10-CM | POA: Diagnosis present

## 2022-04-09 DIAGNOSIS — Z681 Body mass index (BMI) 19 or less, adult: Secondary | ICD-10-CM | POA: Diagnosis not present

## 2022-04-09 DIAGNOSIS — M7989 Other specified soft tissue disorders: Secondary | ICD-10-CM | POA: Diagnosis not present

## 2022-04-09 DIAGNOSIS — J168 Pneumonia due to other specified infectious organisms: Secondary | ICD-10-CM | POA: Diagnosis not present

## 2022-04-09 DIAGNOSIS — R64 Cachexia: Secondary | ICD-10-CM | POA: Diagnosis present

## 2022-04-09 DIAGNOSIS — I5032 Chronic diastolic (congestive) heart failure: Secondary | ICD-10-CM | POA: Diagnosis not present

## 2022-04-09 DIAGNOSIS — R4182 Altered mental status, unspecified: Secondary | ICD-10-CM | POA: Diagnosis not present

## 2022-04-09 DIAGNOSIS — Z85828 Personal history of other malignant neoplasm of skin: Secondary | ICD-10-CM | POA: Diagnosis not present

## 2022-04-09 DIAGNOSIS — I2584 Coronary atherosclerosis due to calcified coronary lesion: Secondary | ICD-10-CM | POA: Diagnosis not present

## 2022-04-09 DIAGNOSIS — R609 Edema, unspecified: Secondary | ICD-10-CM | POA: Diagnosis not present

## 2022-04-09 DIAGNOSIS — J90 Pleural effusion, not elsewhere classified: Secondary | ICD-10-CM | POA: Diagnosis present

## 2022-04-09 DIAGNOSIS — Z66 Do not resuscitate: Secondary | ICD-10-CM | POA: Diagnosis present

## 2022-04-09 DIAGNOSIS — I5031 Acute diastolic (congestive) heart failure: Secondary | ICD-10-CM

## 2022-04-09 DIAGNOSIS — N390 Urinary tract infection, site not specified: Secondary | ICD-10-CM | POA: Diagnosis present

## 2022-04-09 DIAGNOSIS — R6521 Severe sepsis with septic shock: Secondary | ICD-10-CM | POA: Diagnosis present

## 2022-04-09 DIAGNOSIS — J95811 Postprocedural pneumothorax: Secondary | ICD-10-CM | POA: Diagnosis present

## 2022-04-09 DIAGNOSIS — R079 Chest pain, unspecified: Secondary | ICD-10-CM | POA: Diagnosis not present

## 2022-04-09 DIAGNOSIS — R7989 Other specified abnormal findings of blood chemistry: Secondary | ICD-10-CM

## 2022-04-09 DIAGNOSIS — Z932 Ileostomy status: Secondary | ICD-10-CM | POA: Diagnosis not present

## 2022-04-09 DIAGNOSIS — Z515 Encounter for palliative care: Secondary | ICD-10-CM | POA: Diagnosis not present

## 2022-04-09 DIAGNOSIS — I509 Heart failure, unspecified: Secondary | ICD-10-CM

## 2022-04-09 DIAGNOSIS — Z79899 Other long term (current) drug therapy: Secondary | ICD-10-CM | POA: Diagnosis not present

## 2022-04-09 DIAGNOSIS — R846 Abnormal cytological findings in specimens from respiratory organs and thorax: Secondary | ICD-10-CM | POA: Diagnosis not present

## 2022-04-09 DIAGNOSIS — I959 Hypotension, unspecified: Secondary | ICD-10-CM | POA: Diagnosis not present

## 2022-04-09 DIAGNOSIS — Z711 Person with feared health complaint in whom no diagnosis is made: Secondary | ICD-10-CM | POA: Diagnosis not present

## 2022-04-09 DIAGNOSIS — L89150 Pressure ulcer of sacral region, unstageable: Secondary | ICD-10-CM | POA: Diagnosis present

## 2022-04-09 DIAGNOSIS — Z9889 Other specified postprocedural states: Secondary | ICD-10-CM

## 2022-04-09 DIAGNOSIS — D649 Anemia, unspecified: Secondary | ICD-10-CM | POA: Diagnosis present

## 2022-04-09 DIAGNOSIS — J9811 Atelectasis: Secondary | ICD-10-CM | POA: Diagnosis present

## 2022-04-09 DIAGNOSIS — Z1152 Encounter for screening for COVID-19: Secondary | ICD-10-CM | POA: Diagnosis not present

## 2022-04-09 DIAGNOSIS — R402421 Glasgow coma scale score 9-12, in the field [EMT or ambulance]: Secondary | ICD-10-CM | POA: Diagnosis not present

## 2022-04-09 DIAGNOSIS — Z792 Long term (current) use of antibiotics: Secondary | ICD-10-CM | POA: Diagnosis not present

## 2022-04-09 DIAGNOSIS — F0390 Unspecified dementia without behavioral disturbance: Secondary | ICD-10-CM | POA: Diagnosis present

## 2022-04-09 DIAGNOSIS — I4821 Permanent atrial fibrillation: Secondary | ICD-10-CM | POA: Diagnosis present

## 2022-04-09 DIAGNOSIS — J690 Pneumonitis due to inhalation of food and vomit: Secondary | ICD-10-CM | POA: Diagnosis present

## 2022-04-09 DIAGNOSIS — J918 Pleural effusion in other conditions classified elsewhere: Secondary | ICD-10-CM | POA: Diagnosis present

## 2022-04-09 DIAGNOSIS — I495 Sick sinus syndrome: Secondary | ICD-10-CM | POA: Diagnosis present

## 2022-04-09 DIAGNOSIS — E8770 Fluid overload, unspecified: Secondary | ICD-10-CM | POA: Diagnosis not present

## 2022-04-09 DIAGNOSIS — Y95 Nosocomial condition: Secondary | ICD-10-CM | POA: Diagnosis present

## 2022-04-09 DIAGNOSIS — Y838 Other surgical procedures as the cause of abnormal reaction of the patient, or of later complication, without mention of misadventure at the time of the procedure: Secondary | ICD-10-CM | POA: Diagnosis present

## 2022-04-09 DIAGNOSIS — I480 Paroxysmal atrial fibrillation: Secondary | ICD-10-CM | POA: Diagnosis not present

## 2022-04-09 DIAGNOSIS — Z4682 Encounter for fitting and adjustment of non-vascular catheter: Secondary | ICD-10-CM | POA: Diagnosis not present

## 2022-04-09 DIAGNOSIS — I2510 Atherosclerotic heart disease of native coronary artery without angina pectoris: Secondary | ICD-10-CM | POA: Diagnosis not present

## 2022-04-09 DIAGNOSIS — Z7401 Bed confinement status: Secondary | ICD-10-CM | POA: Diagnosis not present

## 2022-04-09 DIAGNOSIS — Z7189 Other specified counseling: Secondary | ICD-10-CM | POA: Diagnosis not present

## 2022-04-09 DIAGNOSIS — R404 Transient alteration of awareness: Secondary | ICD-10-CM | POA: Diagnosis not present

## 2022-04-09 DIAGNOSIS — Z789 Other specified health status: Secondary | ICD-10-CM | POA: Diagnosis not present

## 2022-04-09 DIAGNOSIS — Z91010 Allergy to peanuts: Secondary | ICD-10-CM | POA: Diagnosis not present

## 2022-04-09 DIAGNOSIS — R531 Weakness: Secondary | ICD-10-CM | POA: Diagnosis not present

## 2022-04-09 HISTORY — PX: IR THORACENTESIS ASP PLEURAL SPACE W/IMG GUIDE: IMG5380

## 2022-04-09 LAB — CBC WITH DIFFERENTIAL/PLATELET
Abs Immature Granulocytes: 0.07 10*3/uL (ref 0.00–0.07)
Basophils Absolute: 0.1 10*3/uL (ref 0.0–0.1)
Basophils Relative: 0 %
Eosinophils Absolute: 0 10*3/uL (ref 0.0–0.5)
Eosinophils Relative: 0 %
HCT: 27.1 % — ABNORMAL LOW (ref 36.0–46.0)
Hemoglobin: 8.9 g/dL — ABNORMAL LOW (ref 12.0–15.0)
Immature Granulocytes: 1 %
Lymphocytes Relative: 4 %
Lymphs Abs: 0.6 10*3/uL — ABNORMAL LOW (ref 0.7–4.0)
MCH: 30.1 pg (ref 26.0–34.0)
MCHC: 32.8 g/dL (ref 30.0–36.0)
MCV: 91.6 fL (ref 80.0–100.0)
Monocytes Absolute: 1.2 10*3/uL — ABNORMAL HIGH (ref 0.1–1.0)
Monocytes Relative: 9 %
Neutro Abs: 12.3 10*3/uL — ABNORMAL HIGH (ref 1.7–7.7)
Neutrophils Relative %: 86 %
Platelets: 388 10*3/uL (ref 150–400)
RBC: 2.96 MIL/uL — ABNORMAL LOW (ref 3.87–5.11)
RDW: 17.3 % — ABNORMAL HIGH (ref 11.5–15.5)
WBC: 14.2 10*3/uL — ABNORMAL HIGH (ref 4.0–10.5)
nRBC: 0 % (ref 0.0–0.2)

## 2022-04-09 LAB — ECHOCARDIOGRAM COMPLETE
Area-P 1/2: 2.76 cm2
Calc EF: 63.8 %
MV M vel: 4.38 m/s
MV Peak grad: 76.7 mmHg
MV VTI: 2.25 cm2
Radius: 0.3 cm
S' Lateral: 3 cm
Single Plane A2C EF: 62.8 %
Single Plane A4C EF: 64.6 %

## 2022-04-09 LAB — BASIC METABOLIC PANEL
Anion gap: 8 (ref 5–15)
BUN: 15 mg/dL (ref 8–23)
CO2: 25 mmol/L (ref 22–32)
Calcium: 9.2 mg/dL (ref 8.9–10.3)
Chloride: 101 mmol/L (ref 98–111)
Creatinine, Ser: 0.5 mg/dL (ref 0.44–1.00)
GFR, Estimated: >60 mL/min (ref >60)
Glucose, Bld: 100 mg/dL — ABNORMAL HIGH (ref 70–99)
Potassium: 4.1 mmol/L (ref 3.5–5.1)
Sodium: 134 mmol/L — ABNORMAL LOW (ref 135–145)

## 2022-04-09 LAB — URINALYSIS, ROUTINE W REFLEX MICROSCOPIC
Bilirubin Urine: NEGATIVE
Glucose, UA: NEGATIVE mg/dL
Hgb urine dipstick: NEGATIVE
Ketones, ur: NEGATIVE mg/dL
Leukocytes,Ua: NEGATIVE
Nitrite: NEGATIVE
Protein, ur: NEGATIVE mg/dL
Specific Gravity, Urine: 1.025 (ref 1.005–1.030)
pH: 5 (ref 5.0–8.0)

## 2022-04-09 LAB — LACTIC ACID, PLASMA
Lactic Acid, Venous: 1.2 mmol/L (ref 0.5–1.9)
Lactic Acid, Venous: 0.9 mmol/L (ref 0.5–1.9)

## 2022-04-09 LAB — I-STAT ARTERIAL BLOOD GAS, ED
Acid-base deficit: 1 mmol/L (ref 0.0–2.0)
Bicarbonate: 23 mmol/L (ref 20.0–28.0)
Calcium, Ion: 1.29 mmol/L (ref 1.15–1.40)
HCT: 24 % — ABNORMAL LOW (ref 36.0–46.0)
Hemoglobin: 8.2 g/dL — ABNORMAL LOW (ref 12.0–15.0)
O2 Saturation: 97 %
Patient temperature: 97.5
Potassium: 4.6 mmol/L (ref 3.5–5.1)
Sodium: 133 mmol/L — ABNORMAL LOW (ref 135–145)
TCO2: 24 mmol/L (ref 22–32)
pCO2 arterial: 31.3 mmHg — ABNORMAL LOW (ref 32–48)
pH, Arterial: 7.472 — ABNORMAL HIGH (ref 7.35–7.45)
pO2, Arterial: 77 mmHg — ABNORMAL LOW (ref 83–108)

## 2022-04-09 LAB — RESPIRATORY PANEL BY PCR

## 2022-04-09 LAB — GLUCOSE, CAPILLARY
Glucose-Capillary: 98 mg/dL (ref 70–99)
Glucose-Capillary: 84 mg/dL (ref 70–99)

## 2022-04-09 LAB — C DIFFICILE QUICK SCREEN W PCR REFLEX
C Diff antigen: NEGATIVE
C Diff interpretation: NOT DETECTED
C Diff toxin: NEGATIVE

## 2022-04-09 LAB — LACTATE DEHYDROGENASE: LDH: 142 U/L (ref 98–192)

## 2022-04-09 LAB — HIV ANTIBODY (ROUTINE TESTING W REFLEX): HIV Screen 4th Generation wRfx: NONREACTIVE

## 2022-04-09 LAB — MRSA NEXT GEN BY PCR, NASAL: MRSA by PCR Next Gen: NOT DETECTED

## 2022-04-09 LAB — HEMOGLOBIN AND HEMATOCRIT, BLOOD
HCT: 24.9 % — ABNORMAL LOW (ref 36.0–46.0)
Hemoglobin: 7.8 g/dL — ABNORMAL LOW (ref 12.0–15.0)

## 2022-04-09 LAB — PROCALCITONIN: Procalcitonin: <0.1 ng/mL

## 2022-04-09 LAB — STREP PNEUMONIAE URINARY ANTIGEN: Strep Pneumo Urinary Antigen: NEGATIVE

## 2022-04-09 MED ORDER — VANCOMYCIN HCL 125 MG PO CAPS
125.0000 mg | ORAL_CAPSULE | Freq: Two times a day (BID) | ORAL | Status: DC
  Administered 2022-04-09 – 2022-04-15 (×12): 125 mg via ORAL
  Filled 2022-04-09 (×16): qty 1

## 2022-04-09 MED ORDER — DILTIAZEM HCL 60 MG PO TABS
30.0000 mg | ORAL_TABLET | Freq: Four times a day (QID) | ORAL | Status: DC
  Administered 2022-04-10: 30 mg via ORAL
  Filled 2022-04-09 (×2): qty 1

## 2022-04-09 MED ORDER — VANCOMYCIN HCL IN DEXTROSE 1-5 GM/200ML-% IV SOLN
1000.0000 mg | Freq: Once | INTRAVENOUS | Status: AC
  Administered 2022-04-09: 1000 mg via INTRAVENOUS
  Filled 2022-04-09: qty 200

## 2022-04-09 MED ORDER — CHLORHEXIDINE GLUCONATE CLOTH 2 % EX PADS
6.0000 | MEDICATED_PAD | Freq: Every day | CUTANEOUS | Status: DC
  Administered 2022-04-09 – 2022-04-15 (×5): 6 via TOPICAL

## 2022-04-09 MED ORDER — APIXABAN 2.5 MG PO TABS
2.5000 mg | ORAL_TABLET | Freq: Two times a day (BID) | ORAL | Status: DC
  Administered 2022-04-09 – 2022-04-11 (×5): 2.5 mg via ORAL
  Filled 2022-04-09 (×6): qty 1

## 2022-04-09 MED ORDER — MEMANTINE HCL 10 MG PO TABS
5.0000 mg | ORAL_TABLET | Freq: Two times a day (BID) | ORAL | Status: DC
  Administered 2022-04-09 – 2022-04-15 (×13): 5 mg via ORAL
  Filled 2022-04-09 (×14): qty 1

## 2022-04-09 MED ORDER — SODIUM CHLORIDE 0.9 % IV SOLN
2.0000 g | Freq: Two times a day (BID) | INTRAVENOUS | Status: DC
  Administered 2022-04-09 – 2022-04-10 (×2): 2 g via INTRAVENOUS
  Filled 2022-04-09 (×2): qty 12.5

## 2022-04-09 MED ORDER — DOPAMINE-DEXTROSE 3.2-5 MG/ML-% IV SOLN
0.0000 ug/kg/min | INTRAVENOUS | Status: DC
  Administered 2022-04-09: 5 ug/kg/min via INTRAVENOUS
  Filled 2022-04-09: qty 250

## 2022-04-09 MED ORDER — BUMETANIDE 0.5 MG PO TABS
0.5000 mg | ORAL_TABLET | Freq: Two times a day (BID) | ORAL | Status: DC
  Administered 2022-04-09 – 2022-04-15 (×11): 0.5 mg via ORAL
  Filled 2022-04-09 (×14): qty 1

## 2022-04-09 MED ORDER — OXYCODONE HCL 5 MG PO TABS
5.0000 mg | ORAL_TABLET | Freq: Four times a day (QID) | ORAL | Status: DC | PRN
  Administered 2022-04-09 – 2022-04-15 (×14): 5 mg via ORAL
  Filled 2022-04-09 (×14): qty 1

## 2022-04-09 MED ORDER — VANCOMYCIN HCL IN DEXTROSE 1-5 GM/200ML-% IV SOLN: 1000.0000 mg | INTRAVENOUS | Status: DC

## 2022-04-09 MED ORDER — SODIUM CHLORIDE 0.9 % IV SOLN: 500.0000 mg | INTRAVENOUS | Status: DC

## 2022-04-09 MED ORDER — LIDOCAINE HCL 1 % IJ SOLN
INTRAMUSCULAR | Status: AC
  Administered 2022-04-09: 10 mL
  Filled 2022-04-09: qty 20

## 2022-04-09 MED ORDER — SODIUM CHLORIDE 0.9 % IV BOLUS
500.0000 mL | Freq: Once | INTRAVENOUS | Status: AC
  Administered 2022-04-09: 500 mL via INTRAVENOUS

## 2022-04-09 MED ORDER — ACETAMINOPHEN 10 MG/ML IV SOLN
1000.0000 mg | Freq: Once | INTRAVENOUS | Status: AC
  Administered 2022-04-09: 1000 mg via INTRAVENOUS
  Filled 2022-04-09: qty 100

## 2022-04-09 MED ORDER — MEDIHONEY WOUND/BURN DRESSING EX PSTE
1.0000 | PASTE | Freq: Every day | CUTANEOUS | Status: DC
  Administered 2022-04-10 – 2022-04-15 (×6): 1 via TOPICAL
  Filled 2022-04-09 (×2): qty 44

## 2022-04-09 MED ORDER — ALBUMIN HUMAN 5 % IV SOLN
25.0000 g | Freq: Once | INTRAVENOUS | Status: AC
  Administered 2022-04-09: 25 g via INTRAVENOUS
  Filled 2022-04-09: qty 500

## 2022-04-09 MED ORDER — ALBUMIN HUMAN 25 % IV SOLN
12.5000 g | Freq: Once | INTRAVENOUS | Status: AC
  Administered 2022-04-09: 12.5 g via INTRAVENOUS
  Filled 2022-04-09: qty 50

## 2022-04-09 MED ORDER — AMIODARONE HCL 200 MG PO TABS
200.0000 mg | ORAL_TABLET | Freq: Every day | ORAL | Status: DC
  Administered 2022-04-10 – 2022-04-15 (×6): 200 mg via ORAL
  Filled 2022-04-09 (×7): qty 1

## 2022-04-09 MED ORDER — SODIUM CHLORIDE 0.9 % IV SOLN: 2.0000 g | Freq: Three times a day (TID) | INTRAVENOUS | Status: DC

## 2022-04-09 NOTE — Consult Note (Signed)
[facility] Nurse Consult Note: Bedside RN consulted to inquire if it was acceptable to place compression hosiery on patient.  CC Medicine has ordered ultrasound of lower extremities. I advise staff nurse to request guidance from [place] MD on the use of compression hosiery, even TED hose (11-42mHG).  [facility] team will not follow, but will remain available to this patient, the nursing and medical teams.  Please re-consult if needed.  Thank you for inviting [place] participate in this patient's Plan of Care.  LMaudie Flakes MSN, RN, CNS, GB[place] CSerita Grammes [employer] FUnis[employer] Corporationphone:  [phone]

## 2022-04-09 NOTE — Progress Notes (Signed)
Patient brought to 3M13 from ED. Patient alert and oriented to self, place, and situation. Pupils equal, round, 3 mm, and brisk. Lungs diminished throughout all lungs fields. S1 and S2 auscultated, NSR, +3 pitting edema in bilateral lower extremities, +1 pitting edema in bilateral upper extremities, see flowsheets for wound documentation. Bed alarm on, call bell within reach, bed in lowest position.  1845: Notified PA of difficulty obtaining second IV access via IV team. Per PA, contact [facility] for PICC line if unable to obtain second peripheral IV.  Lesle Reek, RN

## 2022-04-09 NOTE — Progress Notes (Signed)
[facility] Progress Note Patient Name: MALISA RUGGIERO DOB: 1935-08-30 MRN: [id number]   Date of Service  04/09/2022  HPI/Events of Note  Limited venous access and patient on a Dopamine IV infusion. Nursing requests PICC line placement.   eICU Interventions  Plan: Place PICC line.      Intervention Category Major Interventions: Other:;Hypotension - evaluation and management  Lysle Dingwall 04/09/2022, 9:40 PM

## 2022-04-09 NOTE — ED Notes (Signed)
Pt transported to IR via stretcher.  

## 2022-04-09 NOTE — H&P (Addendum)
History and Physical    [name] TWS:[id number] DOB: [birth year] DOA: 04/08/2022  PCP: Cassandria Anger, MD  Patient coming from: [place]  I have personally briefly reviewed patient's old medical records in [place]  Chief Complaint: body aches sob x 24 hours  HPI: Julie Montgomery is a 86 y.o. female with medical history significant of  dementia, recurrent C. difficile infection, permanent A-fib on Eliquis, chronic hyponatremia,  failure to thrive, stroke, H. pylori, abdominal perforation with subtotal colectomy and end ileostomy (03/21/2022) during recent prolonged hospitalization 03/05/22-03/30/22 who presents to ed with body aches /sob x24 hours associated with poor appetite and generalized malaise and worsening edema. Patient noted her breathing is much improved since being admitted to ED. Has no chest pain n/v/d currently.  ED Course:  Patient was initially seen at [facility] and was found on evaluation to have respiratory failure , possible sepsis  due to CAP as well as possible CHF exacerbation. Per EDP at [facility] on arrival patient  was afebrile, bradycardic P46, tachypneic RR 22, BP 105/56, saturating 97%, subsequently desaturated to 80% requiring oxygen. Per EDP patient also had drop in BP and there was concern that patient may require ICU admission. Bedside echo no pericardial effusion Patient discussed with critical care who recommended ED-ED transfer for evaluation.  Per critical care patient not ICU candidate at this time and recommended medicine admit.   Evaluation as follows  Current vitals 81/51, hr 51, rr 15  sat 100% previously s/p fluid bolus systolic 1teens.  Patient of note is making urine.  Patient was give  albumin/ 500 cc ns bolus.  With improvement in bp to 115/67  Labs: CT chest GYF:VCBSWHQPRF: Increase in large bilateral pleural effusions with lower lobe consolidation right considerably greater than left.   Interval changes consistent with subtotal colectomy  and right-sided end ileostomy. No obstructive changes are seen.  Cxr  MPRESSION: Chronic bilateral effusions.   Patchy right basilar airspace opacity. UA rare bacteria , procal 0.10, lactic 1.1  ce 7,8 BNP 904 (117) Na 131,K 5.7, cr 0.48, ast 43, alt50 alph phos 176  Lacitc 1.5  EKGslow fib Wbc 12 left shift , hgb 9.5,plt 531  Tx lasix, 40 mg zofran /dilaudid /albumin ns 500 cc bolus x 2 Review of Systems: As per HPI otherwise 10 point review of systems negative.   Past Medical History:  Diagnosis Date   Adenomatous polyp of colon 2020   Allergy    Anxiety    Basal cell carcinoma (BCC)    Cataract    Depression    Helicobacter pylori gastritis    Osteopenia    Paroxysmal atrial fibrillation (HCC)    Pneumonia    hx of 03/2008   Stroke [facility])    TIA long time ago    Past Surgical History:  Procedure Laterality Date   APPENDECTOMY     BIOPSY  03/15/2022   Procedure: BIOPSY;  Surgeon: Jerene Bears, MD;  Location: [facility];  Service: Gastroenterology;;   BREAST LUMPECTOMY     right   CATARACT EXTRACTION W/ INTRAOCULAR LENS  IMPLANT, BILATERAL Bilateral    CESAREAN [name] N/A 03/15/2022   Procedure: FLEXIBLE SIGMOIDOSCOPY;  Surgeon: Jerene Bears, MD;  Location: [place];  Service: Gastroenterology;  Laterality: N/A;   LAPAROTOMY N/A 03/21/2022   Procedure: EXPLORATORY LAPAROTOMY,subtotal colectomy with end ileostomy;  Surgeon: Felicie Morn, MD;  Location: [place];  Service: General;  Laterality: N/A;   MOHS SURGERY     PARTIAL HYSTERECTOMY     ROTATOR CUFF REPAIR  2012   SKIN GRAFT     teeth remmoved       reports that she has never smoked. She has never used smokeless tobacco. She reports that she does not drink alcohol and does not use drugs.  Allergies  Allergen Reactions   Lactose Intolerance (Gi) Other (See Comments)    Intolerance    Lovastatin Other (See Comments)    Unknown reaction    Mometasone Furo-Formoterol Fum Other (See Comments)    Loss of appetite, laryngitis    Peanut-Containing Drug Products Other (See Comments)   Prozac [Fluoxetine Hcl] Other (See Comments)    Jumpy   Sulfa Antibiotics Other (See Comments)    Unknown reaction    Family History  Problem Relation Age of Onset   Diabetes Sister    Cancer Sister        lung ca   Coronary artery disease Brother    Dementia Brother    Cancer Brother        lung ca   Stomach cancer Paternal Grandmother    Colon cancer Son    Breast cancer Neg Hx    Esophageal cancer Neg Hx    Rectal cancer Neg Hx     Prior to Admission medications   Medication Sig Start Date End Date Taking? Authorizing Provider  alum & mag hydroxide-simeth (MAALOX/MYLANTA) 200-200-20 MG/5ML suspension Take 30 mLs by mouth every 6 (six) hours as needed for indigestion or heartburn. 03/30/22   Little Ishikawa, MD  amiodarone (PACERONE) 200 MG tablet Take 1 tablet (200 mg total) by mouth daily. 03/30/22   Little Ishikawa, MD  apixaban (ELIQUIS) 2.5 MG TABS tablet Take 1 tablet (2.5 mg total) by mouth 2 (two) times daily. 03/01/22   Plotnikov, Evie Lacks, MD  diltiazem (CARDIZEM CD) 180 MG 24 hr capsule Take 1 capsule (180 mg total) by mouth daily. 03/01/22   Raiford Noble Latif, DO  loperamide (IMODIUM) 2 MG capsule Take 1 capsule (2 mg total) by mouth daily. 03/30/22   Little Ishikawa, MD  LORazepam (ATIVAN) 0.5 MG tablet Take 1 tablet (0.5 mg total) by mouth at bedtime as needed for anxiety or sleep. 04/02/22   Plotnikov, Evie Lacks, MD  memantine (NAMENDA) 5 MG tablet Take 5 mg by mouth 2 (two) times daily. 02/20/22   [provider]  Multiple Vitamins-Minerals (ZINC PO) Take 1 tablet by mouth daily.    [provider]  polycarbophil (FIBERCON) 625 MG tablet Take 2 tablets (1,250 mg total) by mouth daily. 03/30/22   Little Ishikawa, MD  Probiotic Product (ALIGN) 4 MG CAPS Take 1 capsule (4 mg total) by mouth  daily. 12/26/21   Plotnikov, Evie Lacks, MD  saccharomyces boulardii (FLORASTOR) 250 MG capsule Take 1 capsule (250 mg total) by mouth 2 (two) times daily. 03/30/22 04/29/22  Little Ishikawa, MD  vancomycin (VANCOCIN) 125 MG capsule Take 1 capsule (125 mg total) by mouth 2 (two) times daily for 12 days. 03/30/22 04/11/22  Little Ishikawa, MD  Vitamin D, Ergocalciferol, (DRISDOL) 1.25 MG (50000 UNIT) CAPS capsule Take 1 capsule (50,000 Units total) by mouth every 7 (seven) days. 04/05/22   Little Ishikawa, MD    Physical Exam: Vitals:   04/08/22 2315 04/08/22 2321 04/08/22 2322 04/08/22 2330  BP: (!) 80/54   (!) 118/57  Pulse: (!) 48  Marland Kitchen)  49 (!) 51  Resp: (!) '21  13 15  '$ Temp:      TempSrc:      SpO2: 93% (!) 85% 94% 97%    Constitutional: NAD, calm, comfortable Vitals:   04/08/22 2315 04/08/22 2321 04/08/22 2322 04/08/22 2330  BP: (!) 80/54   (!) 118/57  Pulse: (!) 48  (!) 49 (!) 51  Resp: (!) '21  13 15  '$ Temp:      TempSrc:      SpO2: 93% (!) 85% 94% 97%   Eyes: PERRL, lids and conjunctivae normal ENMT: Mucous membranes are moist. Posterior pharynx clear of any exudate or lesions.Normal dentition.  Neck: normal, supple, no masses, no thyromegaly Respiratory: diminished  Normal respiratory effort. No accessory muscle use.  Cardiovascular: Regular rate and rhythm, no murmurs / rubs / gallops. +extremity edema. 2+ pedal pulses. Abdomen: no tenderness, no masses palpated. No hepatosplenomegaly. Bowel sounds positive.ostomy with dark stool Musculoskeletal: no clubbing / cyanosis. No joint deformity upper and lower extremities. Good ROM, no contractures. Normal muscle tone.  Skin: no rashes, lesions, ulcers. No induration Neurologic: CN 2-12 grossly intact. Sensation intact, . YQMV7 Psychiatric: Normal judgment and insight. Alert and oriented x 2. Normal mood.    Labs on Admission: I have personally reviewed following labs and imaging studies  CBC: Recent Labs  Lab  04/08/22 1825  WBC 12.0*  NEUTROABS 9.8*  HGB 9.5*  HCT 29.6*  MCV 91.1  PLT 846*   Basic Metabolic Panel: Recent Labs  Lab 04/08/22 1825  NA 131*  K 5.7*  CL 98  CO2 24  GLUCOSE 84  BUN 27*  CREATININE 0.48  CALCIUM 10.2   GFR: Estimated Creatinine Clearance: 40 mL/min (by C-G formula based on SCr of 0.48 mg/dL). Liver Function Tests: Recent Labs  Lab 04/08/22 1825  AST 43*  ALT 50*  ALKPHOS 176*  BILITOT 0.3  PROT 7.0  ALBUMIN 3.8   Recent Labs  Lab 04/08/22 1825  LIPASE 30   No results for input(s): "AMMONIA" in the last 168 hours. Coagulation Profile: Recent Labs  Lab 04/08/22 1825  INR 1.1   Cardiac Enzymes: No results for input(s): "CKTOTAL", "CKMB", "CKMBINDEX", "TROPONINI" in the last 168 hours. BNP (last 3 results) Recent Labs    01/26/22 1447  PROBNP 68.0   HbA1C: No results for input(s): "HGBA1C" in the last 72 hours. CBG: Recent Labs  Lab 04/08/22 1757  GLUCAP 86   Lipid Profile: No results for input(s): "CHOL", "HDL", "LDLCALC", "TRIG", "CHOLHDL", "LDLDIRECT" in the last 72 hours. Thyroid Function Tests: No results for input(s): "TSH", "T4TOTAL", "FREET4", "T3FREE", "THYROIDAB" in the last 72 hours. Anemia Panel: No results for input(s): "VITAMINB12", "FOLATE", "FERRITIN", "TIBC", "IRON", "RETICCTPCT" in the last 72 hours. Urine analysis:    Component Value Date/Time   COLORURINE YELLOW 04/08/2022 2224   APPEARANCEUR CLEAR 04/08/2022 2224   LABSPEC 1.027 04/08/2022 2224   PHURINE 5.5 04/08/2022 2224   GLUCOSEU NEGATIVE 04/08/2022 2224   GLUCOSEU NEGATIVE 04/05/2021 1236   HGBUR NEGATIVE 04/08/2022 2224   BILIRUBINUR NEGATIVE 04/08/2022 2224   KETONESUR NEGATIVE 04/08/2022 2224   PROTEINUR NEGATIVE 04/08/2022 2224   UROBILINOGEN 0.2 04/05/2021 1236   NITRITE NEGATIVE 04/08/2022 2224   LEUKOCYTESUR MODERATE (A) 04/08/2022 2224    Radiological Exams on Admission: CT Angio Chest PE W and/or Wo Contrast  Result Date:  04/08/2022 CLINICAL DATA:  Pulmonary embolism (PE) suspected, high prob Generalized body aches and shortness of breath. Nausea. EXAM: CT ANGIOGRAPHY CHEST WITH  CONTRAST TECHNIQUE: Multidetector CT imaging of the chest was performed using the standard protocol during bolus administration of intravenous contrast. Multiplanar CT image reconstructions and MIPs were obtained to evaluate the vascular anatomy. RADIATION DOSE REDUCTION: This exam was performed according to the departmental dose-optimization program which includes automated exposure control, adjustment of the mA and/or kV according to patient size and/or use of iterative reconstruction technique. CONTRAST:  107m OMNIPAQUE IOHEXOL 350 MG/ML SOLN COMPARISON:  Chest radiograph earlier today. Noted portions from abdominopelvic CT earlier today. Chest CTA 12/24/2021 FINDINGS: Cardiovascular: There are no filling defects within the pulmonary arteries to suggest pulmonary embolus. Calcified aortic atherosclerosis. Stable aortic tortuosity. There is no contrast in the aorta. Cardiomegaly with contrast refluxing into the hepatic veins and IVC. No pericardial effusion. Mediastinum/Nodes: 11 mm right hilar node. There are shotty mediastinal lymph nodes. Patulous esophagus. Lungs/Pleura: Moderate bilateral pleural effusions. There is associated compressive atelectasis. Additionally non dependent consolidation in the right lower lobe is atypical for passive atelectasis. Nodular and geographic ground-glass airspace disease in the right upper and middle and left upper lobes. No endobronchial lesions or debris. Calcified granuloma in the lung bases. Upper Abdomen: Assessed on abdominal CT earlier today. No new findings. Musculoskeletal: Generalized body wall edema. There are no acute or suspicious osseous abnormalities. Review of the MIP images confirms the above findings. IMPRESSION: 1. No pulmonary embolus. 2. Moderate bilateral pleural effusions with associated  compressive atelectasis. Additionally non dependent consolidation in the right lower lobe is atypical for passive atelectasis, and suspicious for superimposed pneumonia. 3. Nodular and geographic ground-glass airspace disease in the right upper and middle and left upper lobes, likely infectious or inflammatory in etiology. Pulmonary edema is considered but felt much less likely. 4. Cardiomegaly with contrast refluxing into the hepatic veins and IVC consistent with elevated right heart pressures. Generalized body wall edema/anasarca. Aortic Atherosclerosis (ICD10-I70.0). Electronically Signed   By: MKeith RakeM.D.   On: 04/08/2022 21:53   CT ABDOMEN PELVIS W CONTRAST  Result Date: 04/08/2022 CLINICAL DATA:  Acute abdominal pain EXAM: CT ABDOMEN AND PELVIS WITH CONTRAST TECHNIQUE: Multidetector CT imaging of the abdomen and pelvis was performed using the standard protocol following bolus administration of intravenous contrast. RADIATION DOSE REDUCTION: This exam was performed according to the departmental dose-optimization program which includes automated exposure control, adjustment of the mA and/or kV according to patient size and/or use of iterative reconstruction technique. CONTRAST:  790mOMNIPAQUE IOHEXOL 350 MG/ML SOLN COMPARISON:  03/05/2022 FINDINGS: Lower chest: Lung bases demonstrate bilateral large pleural effusions with associated lower lobe consolidation worse on the right than the left. These changes have increased in the interval from the prior exam. Calcified granuloma is again seen in the right middle lobe. Hepatobiliary: Gallbladder has been surgically removed. Liver shows no focal mass lesion. Pancreas: Unremarkable. No pancreatic ductal dilatation or surrounding inflammatory changes. Spleen: Normal in size without focal abnormality. Adrenals/Urinary Tract: Adrenal glands are within normal limits bilaterally. Kidneys demonstrate a normal enhancement pattern. Stable left upper pole simple  cyst is noted. No follow-up is recommended. No renal calculi or obstructive changes are seen. The bladder is well distended. Stomach/Bowel: Changes consistent with a decompressed Hartmann pouch are now seen. Right-sided end ileostomy is noted. Changes consistent with the subtotal colectomy are noted. Stomach is decompressed. The visualized small bowel shows no obstructive changes. Small bowel fluid is noted throughout. Vascular/Lymphatic: Aortic atherosclerosis. No enlarged abdominal or pelvic lymph nodes. 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 No  findings to suggest mesenteric ischemia are noted. Reproductive: Status post hysterectomy. No adnexal masses. Other: No significant free pelvic fluid is noted. Mild changes of anasarca are seen in the lateral abdominal wall. Musculoskeletal: Degenerative changes of lumbar spine are noted. IMPRESSION: Increase in large bilateral pleural effusions with lower lobe consolidation right considerably greater than left. Interval changes consistent with subtotal colectomy and right-sided end ileostomy. No obstructive changes are seen. Electronically Signed   By: Inez Catalina M.D.   On: 04/08/2022 20:03   DG Chest Port 1 View  Result Date: 04/08/2022 CLINICAL DATA:  Shortness of breath EXAM: PORTABLE CHEST 1 VIEW COMPARISON:  03/23/2022 FINDINGS: Cardiac shadow is stable. Aortic calcifications are again noted. Chronic pleural effusions are noted stable from the prior study. Skin folds are noted over the left chest. Some patchy airspace opacity is noted in the right lung base adjacent to the effusion. No acute bony abnormality is seen. IMPRESSION: Chronic bilateral effusions. Patchy right basilar airspace opacity. Electronically Signed   By: Inez Catalina M.D.   On: 04/08/2022 19:00    EKG: Independently reviewed. See above  Assessment/Plan  HCAP with sepsis and acute hypoxic respiratory failure -patient with increase wbc and   Opacities on chest imaging -recent d/c from hosptial -start vanc/cefepime/azithromycin  -pulmonary toilet  -urine ag, sputum, f/u on culture data   -trend lactic acid / procalcitonin -maintain map>65   Hyperkalemia  -repeat labs   Chronic hyponatremia -due to chf and overload state -monitor   Acute diastolic heart failure exacerbation  - noted elevated bnp and moderate b/l edema -increasing b/l effusion - difficult balance, note able to diuresis due to hypotension /sepsis  -cardiology consult to further assist with fluid management once able  -echo in am   Hx of Atrial fib  -?tachy/brady syndrome -currently with low rate  -hold av nodal agent  -await cardiology input  -continue on Eliquis   Hx of toxic megacolon due to C-dif  -s/p resection and right-sided ileostomy s/p perforation -continue oral vanc   Sacral Decub -wound care consulted  -supportive pain medication  Hx of CVA -continue on secondary ppx Eliquis     Dementia -resume home regimen    DVT prophylaxis: eliquis Code Status: full  Family Communication:  none at bedside Disposition Plan: patient  expected to be admitted greater than 2 midnights  Consults called: seen by critical care, cardiology Dr Shellia Carwin consulted aware of patient Admission status: step down    Clance Boll MD [facility]  If 7PM-7AM, please contact night-coverage [email or web address] Password [email or web address]  04/09/2022, 12:12 AM

## 2022-04-09 NOTE — Progress Notes (Signed)
[facility] Progress Note Patient Name: Julie Montgomery DOB: 08-23-1935 MRN: [id number]   Date of Service  04/09/2022  HPI/Events of Note  Patient on Amiodarone PO and Cardizem PO for chronic AFIB. Episode of bradycardia earlier and now on Dopamine IV infusion at 5 mcg/kg/min. Now NSR with HR = 780   eICU Interventions  Plan: Hold Amiodarone and Cardizem for tonight.      Intervention Category Major Interventions: Arrhythmia - evaluation and management  [name] 04/09/2022, 8:35 PM

## 2022-04-09 NOTE — Consult Note (Signed)
[facility] Nurse Consult Note: Reason for Consult:Sacral Unstageable pressure injury. Seen several times during last admission (discharge on 03/30/22). Wound type:pressure Pressure Injury POA: Yes Measurement:Last measured on 11/21, 2cm x 2cm with wound bed obscured by the presence of nonviable tissue that was firmly adherent.[place] implemented at that time and is continued today. I have requested that the Bedside RN measure the wound at the time of the next dressing change and document the measurements on the Nursing Flow Sheet today. Wound bed:As noted above Drainage (amount, consistency, odor) Small light yellow consistent with autolytic debridement with Medihoney Periwound:intact. Admitting provider notes no overt signs of infection Dressing procedure/placement/frequency: I have continued the POC implemented by my associate D. Barbie Haggis during last admission for Medihoney (antimicrobial, moisture retentive) to facilitate the autolytic debridement of nonviable tissue. Supportive care will be to place patient on a mattress replacement with low air loss feature and turn her from side to side, minimizing time in the supine position. Bilateral Pressure redistribution heel boots are provided for her use while in bed, a pressure redistribution chair pad is provided for her use when OOB in the chair and should be sent home with her upon discharge from acute care. Recommend aggressive nutritional support and consultation with an RD if that is consistent with the POC.   [facility] Nurse ostomy consult note Stoma type/location: RLQ ileostomy  (Created 03/21/22, Dr. Thermon Leyland) Stomal assessment/size: 1 and 1/4 inch round, budded Peristomal assessment: Not seen today Treatment options for stomal/peristomal skin: skin barrier ring Output: not observed, admitting MD notes stool in pouch with no indication of obstruction Ostomy pouching: 2pc. 2 and 1/4 inch pouching system with skin barrier ring.  Pouch is Kellie Simmering #[id number],  skin barrier is Kellie Simmering # [phone] and skin barrier ring is Kellie Simmering # [phone]  It is recommended to order 5 of each to bedside. Education provided: None today. Patient was minimally participative last admission and husband was taught., They also have paid caregiver assistance at home that supplements HHRN from [place]. Enrolled patient in [place] program: Yes during last admission by K. Mayo on 03/28/22.   [place] nursing team will not follow, but will remain available to this patient, the nursing and medical teams.  Please re-consult if needed.  Thank you for inviting [place] to participate in this patient's Plan of Care.  Maudie Flakes, MSN, RN, CNS, [place], Serita Grammes, [employer], [employer] phone:  [phone]

## 2022-04-09 NOTE — Progress Notes (Signed)
   This pt was referred to [facility] home based [facility] care program provided by [facility]. We had set up initial visit for today and prior to the visit in home today at 1000am we found out pt has been re-hospitalized. I will continue to follow for d/c plans and resume plan for [facility] enrollment at d/c. Iliana Hutt RN [phone]

## 2022-04-09 NOTE — Consult Note (Signed)
CARDIOLOGY CONSULT NOTE  Patient ID: Julie Montgomery MRN: [id number] DOB/AGE: 1936/02/18 86 y.o.  Admit date: 04/08/2022 Attending physician: Chesley Mires, MD Primary Physician:  Cassandria Anger, MD Outpatient Cardiology Provider: Rex Kras, DO, [facility] Inpatient Cardiologist: Rex Kras, DO, [facility]  Reason of consultation: Afib and HFpEF Referring physician: Clance Boll MD   Chief complaint: Body aches, shortness of breath, worsening edema, malaise  HPI:  Julie Montgomery is a 86 y.o. Caucasian female who presents with a chief complaint of " bodyaches, shortness of breath, worsening edema, malaise." Her past medical history and cardiovascular risk factors include: History of recurrent C. difficile colitis with recent history of abdominal perforation with subtotal colectomy and end ileostomy (02/2022) baseline dementia, paroxysmal atrial fibrillation, chronic hyponatremia, Mild coronary artery calcification, Aortic Atherosclerosis (CT 03/2019), basal cell carcinoma, bronchiectasis, history of TIA.  Recently was discharged from the hospital on March 30, 2022 after being admitted for recurrent C. difficile colitis leading to perforated colon requiring surgical intervention with diverting ileostomy.  She presents today with multiple complaints such as body aches, shortness of breath, worsening edema and fatigue/malaise.  Denies anginal discomfort.  Cardiology has been consulted during his hospitalization given her history of atrial fibrillation and concern for heart failure.  [facility] was [facility] for concerning for respiratory failure/sepsis.  She was hypotensive, tachypnea, leukocytosis.  Patient responded well to IV fluid bolus.  CT of the chest illustrates bilateral pleural effusions and groundglass airspace disease right greater than left concerning for infectious or inflammatory etiology and pulmonary edema less likely.  Patient is status post  right-sided thoracentesis with 600 cc of fluid removed.  Currently on IV antibiotics for HCAP.  ALLERGIES: Allergies  Allergen Reactions   Lactose Intolerance (Gi) Other (See Comments)    Intolerance    Lovastatin Other (See Comments)    Unknown reaction   Mometasone Furo-Formoterol Fum Other (See Comments)    Loss of appetite, laryngitis    Peanut-Containing Drug Products Other (See Comments)   Prozac [Fluoxetine Hcl] Other (See Comments)    Jumpy   Sulfa Antibiotics Other (See Comments)    Unknown reaction    PAST MEDICAL HISTORY: Past Medical History:  Diagnosis Date   Adenomatous polyp of colon 2020   Allergy    Anxiety    Basal cell carcinoma (BCC)    Cataract    Depression    Helicobacter pylori gastritis    Osteopenia    Paroxysmal atrial fibrillation (HCC)    Pneumonia    hx of 03/2008   Stroke ([place])    TIA long time ago    PAST SURGICAL HISTORY: Past Surgical History:  Procedure Laterality Date   APPENDECTOMY     BIOPSY  03/15/2022   Procedure: BIOPSY;  Surgeon: Jerene Bears, MD;  Location: [facility] ENDOSCOPY;  Service: Gastroenterology;;   BREAST LUMPECTOMY     right   CATARACT EXTRACTION W/ INTRAOCULAR LENS  IMPLANT, BILATERAL Bilateral    CESAREAN SECTION     CHOLECYSTECTOMY     FLEXIBLE SIGMOIDOSCOPY N/A 03/15/2022   Procedure: FLEXIBLE SIGMOIDOSCOPY;  Surgeon: Jerene Bears, MD;  Location: [place] ENDOSCOPY;  Service: Gastroenterology;  Laterality: N/A;   IR THORACENTESIS ASP PLEURAL SPACE W/IMG GUIDE  04/09/2022   LAPAROTOMY N/A 03/21/2022   Procedure: EXPLORATORY LAPAROTOMY,subtotal colectomy with end ileostomy;  Surgeon: Felicie Morn, MD;  Location: [place];  Service: General;  Laterality: N/A;   [place]  ROTATOR CUFF REPAIR  2012   SKIN GRAFT     teeth remmoved      FAMILY HISTORY: The patient's family history includes Cancer in her brother and sister; Colon cancer in her son; Coronary artery disease in her  brother; Dementia in her brother; Diabetes in her sister; Stomach cancer in her paternal grandmother.   SOCIAL HISTORY:  The patient  reports that she has never smoked. She has never used smokeless tobacco. She reports that she does not drink alcohol and does not use drugs.  MEDICATIONS: Current Outpatient Medications  Medication Instructions   Align 4 mg, Oral, Daily   alum & mag hydroxide-simeth (MAALOX/MYLANTA) 200-200-20 MG/5ML suspension 30 mLs, Oral, Every 6 hours PRN   amiodarone (PACERONE) 200 mg, Oral, Daily   Cartia XT 180 mg, Oral, Daily   Eliquis 2.5 mg, Oral, 2 times daily   loperamide (IMODIUM) 2 mg, Oral, Daily   LORazepam (ATIVAN) 0.5 mg, Oral, At bedtime PRN   memantine (NAMENDA) 5 mg, Oral, 2 times daily   Multiple Vitamins-Minerals (ZINC PO) 1 tablet, Daily   polycarbophil (FIBERCON) 1,250 mg, Oral, Daily   saccharomyces boulardii (FLORASTOR) 250 mg, Oral, 2 times daily   vancomycin (VANCOCIN) 125 mg, Oral, 2 times daily   Vitamin D (Ergocalciferol) (DRISDOL) 50,000 Units, Oral, Every 7 days    REVIEW OF SYSTEMS: Review of Systems  Unable to perform ROS: Dementia  Overall review of systems limited due to underlying dementia.  Denies chest pain, orthopnea, PND.  She had bilateral lower extremity swelling, cough, malnutrition.  PHYSICAL EXAM:    04/09/2022    6:13 PM 04/09/2022    5:45 PM 04/09/2022    5:30 PM  Vitals with BMI  Weight 103 lbs 3 oz    Systolic  024   Diastolic  66   Pulse  93 87  Body mass index is 18.87 kg/m.  Intake/Output Summary (Last 24 hours) at 04/09/2022 1845 Last data filed at 04/09/2022 1818 Gross per 24 hour  Intake 164.87 ml  Output 1025 ml  Net -860.13 ml    Net IO Since Admission: -860.13 mL [04/09/22 1845]  Physical Exam  Constitutional: She appears cachectic. No distress. She appears chronically ill.  Appears older than stated age, hemodynamically stable.   HENT:  Dry mucous membranes  Neck: No JVD present.   Cardiovascular: Normal rate, regular rhythm, S1 normal, S2 normal, intact distal pulses and normal pulses. Exam reveals no gallop, no S3 and no S4.  No murmur heard. Pulmonary/Chest: Effort normal and breath sounds normal. No stridor. She has no wheezes.  Poor inspiratory effort, unable to sit upright, decreased breath sounds bilaterally at the bases with rhonchi's in the upper lung fields  Abdominal: Soft. She exhibits no distension. There is no abdominal tenderness.  Hypoactive bowel sounds, ileostomy bag present with green-colored feces.  Musculoskeletal:        General: Edema (Bilateral lower extremity extending to the thighs) present.     Cervical back: Neck supple.  Neurological:  Awake alert to person, year.  Unaware of the president, location.  Moving all 4 extremities, unable to evaluate gait.  Skin: Skin is warm and moist.  Rash at bilateral dorsum of the feet (likely old fluid blisters).  Unable to evaluate posterior thorax due to the inability to sit up independently.     RADIOLOGY: ECHOCARDIOGRAM COMPLETE  Result Date: 04/09/2022    ECHOCARDIOGRAM REPORT   Patient Name:   Julie Montgomery Date of Exam: 04/09/2022  Medical Rec #:  [id number]       Height:       62.0 in Accession #:    [id number]      Weight:       106.7 lb Date of Birth:  January 15, 1936      BSA:          1.464 m Patient Age:    67 years        BP:           105/41 mmHg Patient Gender: F               HR:           60 bpm. Exam Location:  Inpatient Procedure: 2D Echo, Cardiac Doppler, Color Doppler and 3D Echo Indications:    CHF-Acute Diastolic N05.39  History:        Patient has prior history of Echocardiogram examinations, most                 recent 03/29/2022. Stroke; Arrythmias:Atrial Fibrillation.  Sonographer:    Bernadene Person RDCS Referring Phys: [id number] [facility]  Sonographer Comments: Image acquisition challenging due to patient body habitus. IMPRESSIONS  1. Left ventricular ejection fraction, by  estimation, is 60 to 65%. The left ventricle has normal function. The left ventricle has no regional wall motion abnormalities. Left ventricular diastolic parameters are indeterminate.  2. Right ventricular systolic function is normal. The right ventricular size is normal. There is normal pulmonary artery systolic pressure. The estimated right ventricular systolic pressure is 37.9 mmHg.  3. Left atrial size was mild to moderately dilated.  4. Right atrial size was mild to moderately dilated.  5. The mitral valve is normal in structure. Mild mitral valve regurgitation. No evidence of mitral stenosis.  6. The aortic valve is grossly normal. There is mild calcification of the aortic valve. There is mild thickening of the aortic valve. Aortic valve regurgitation is trivial. Aortic valve sclerosis is present, with no evidence of aortic valve stenosis.  7. The inferior vena cava is normal in size with greater than 50% respiratory variability, suggesting right atrial pressure of 3 mmHg. Comparison(s): No significant change from prior study. Conclusion(s)/Recommendation(s): Otherwise normal echocardiogram, with minor abnormalities described in the report. FINDINGS  Left Ventricle: Left ventricular ejection fraction, by estimation, is 60 to 65%. The left ventricle has normal function. The left ventricle has no regional wall motion abnormalities. The left ventricular internal cavity size was normal in size. There is  no left ventricular hypertrophy. Left ventricular diastolic parameters are indeterminate. Right Ventricle: The right ventricular size is normal. No increase in right ventricular wall thickness. Right ventricular systolic function is normal. There is normal pulmonary artery systolic pressure. The tricuspid regurgitant velocity is 2.30 m/s, and  with an assumed right atrial pressure of 3 mmHg, the estimated right ventricular systolic pressure is 02.4 mmHg. Left Atrium: Left atrial size was mild to moderately  dilated. Right Atrium: Right atrial size was mild to moderately dilated. Pericardium: There is no evidence of pericardial effusion. Mitral Valve: The mitral valve is normal in structure. Mild to moderate mitral annular calcification. Mild mitral valve regurgitation. No evidence of mitral valve stenosis. MV peak gradient, 4.0 mmHg. The mean mitral valve gradient is 2.0 mmHg. Tricuspid Valve: The tricuspid valve is normal in structure. Tricuspid valve regurgitation is mild . No evidence of tricuspid stenosis. Aortic Valve: The aortic valve is grossly normal. There is mild calcification of the aortic valve. There is  mild thickening of the aortic valve. Aortic valve regurgitation is trivial. Aortic valve sclerosis is present, with no evidence of aortic valve stenosis. Pulmonic Valve: The pulmonic valve was not well visualized. Pulmonic valve regurgitation is not visualized. No evidence of pulmonic stenosis. Aorta: The aortic root, ascending aorta, aortic arch and descending aorta are all structurally normal, with no evidence of dilitation or obstruction. Venous: The inferior vena cava is normal in size with greater than 50% respiratory variability, suggesting right atrial pressure of 3 mmHg. IAS/Shunts: The atrial septum is grossly normal. Additional Comments: There is pleural effusion in both left and right lateral regions.  LEFT VENTRICLE PLAX 2D LVIDd:         4.80 cm      Diastology LVIDs:         3.00 cm      LV e' medial:    8.49 cm/s LV PW:         0.60 cm      LV E/e' medial:  10.1 LV IVS:        0.60 cm      LV e' lateral:   7.62 cm/s LVOT diam:     1.80 cm      LV E/e' lateral: 11.2 LV SV:         82 LV SV Index:   56 LVOT Area:     2.54 cm                              3D Volume EF: LV Volumes (MOD)            3D EF:        60 % LV vol d, MOD A2C: 114.0 ml LV EDV:       118 ml LV vol d, MOD A4C: 85.2 ml  LV ESV:       47 ml LV vol s, MOD A2C: 42.4 ml  LV SV:        71 ml LV vol s, MOD A4C: 30.2 ml LV SV MOD  A2C:     71.6 ml LV SV MOD A4C:     85.2 ml LV SV MOD BP:      64.2 ml RIGHT VENTRICLE RV S prime:     12.20 cm/s TAPSE (M-mode): 2.8 cm LEFT ATRIUM             Index        RIGHT ATRIUM           Index LA diam:        4.00 cm 2.73 cm/m   RA Area:     17.60 cm LA Vol (A2C):   55.2 ml 37.71 ml/m  RA Volume:   49.50 ml  33.82 ml/m LA Vol (A4C):   55.0 ml 37.58 ml/m LA Biplane Vol: 60.5 ml 41.33 ml/m  AORTIC VALVE LVOT Vmax:   126.00 cm/s LVOT Vmean:  84.400 cm/s LVOT VTI:    0.322 m  AORTA Ao Root diam: 2.60 cm Ao Asc diam:  3.10 cm MITRAL VALVE                  TRICUSPID VALVE MV Area (PHT): 2.76 cm       TR Peak grad:   21.2 mmHg MV Area VTI:   2.25 cm       TR Vmax:        230.00 cm/s MV Peak grad:  4.0  mmHg MV Mean grad:  2.0 mmHg       SHUNTS MV Vmax:       1.00 m/s       Systemic VTI:  0.32 m MV Vmean:      63.0 cm/s      Systemic Diam: 1.80 cm MV Decel Time: 275 msec MR Peak grad:    76.7 mmHg MR Mean grad:    56.5 mmHg MR Vmax:         438.00 cm/s MR Vmean:        363.0 cm/s MR PISA:         0.57 cm MR PISA Eff ROA: 5 mm MR PISA Radius:  0.30 cm MV E velocity: 85.40 cm/s MV A velocity: 73.90 cm/s MV E/A ratio:  1.16 Buford Dresser MD Electronically signed by Buford Dresser MD Signature Date/Time: 04/09/2022/11:15:01 AM    Final    IR THORACENTESIS ASP PLEURAL SPACE W/IMG GUIDE  Result Date: 04/09/2022 INDICATION: 86 year old female. History of AFib. Presented to the ED with shortness of breath and productive cough. Found to have bilateral pleural effusions. Request is for therapeutic thoracentesis. Right is greater than EXAM: ULTRASOUND GUIDED THERAPEUTIC RIGHT-SIDED  THORACENTESIS MEDICATIONS: Lidocaine 1% 10 mL COMPLICATIONS: None immediate. PROCEDURE: An ultrasound guided thoracentesis was thoroughly discussed with the patient and questions answered. The benefits, risks, alternatives and complications were also discussed. The patient understands and wishes to proceed  with the procedure. Written consent was obtained. Ultrasound was performed to localize and mark an adequate pocket of fluid in the right chest. The area was then prepped and draped in the normal sterile fashion. 1% Lidocaine was used for local anesthesia. Under ultrasound guidance a 6 Fr Safe-T-Centesis catheter was introduced. Thoracentesis was performed. The catheter was removed and a dressing applied. FINDINGS: A total of approximately 600 mL of amber fluid was removed. IMPRESSION: Successful ultrasound guided right-sided therapeutic thoracentesis yielding 600 mL of pleural fluid. Read by: Rushie Nyhan, NP Electronically Signed   By: Ruthann Cancer M.D.   On: 04/09/2022 10:59   DG Chest 1 View  Result Date: 04/09/2022 CLINICAL DATA:  Post thoracentesis EXAM: CHEST  1 VIEW COMPARISON:  04/08/2022 FINDINGS: Upper normal heart size. Atherosclerotic calcification aorta. Bibasilar pleural effusions. Scattered BILATERAL pulmonary infiltrates favoring multifocal pneumonia. No pneumothorax. Bones demineralized with chronic RIGHT rotator cuff tear. IMPRESSION: No pneumothorax following thoracentesis. Patchy BILATERAL pulmonary infiltrates favoring multifocal pneumonia. BILATERAL pleural effusions. Aortic Atherosclerosis (ICD10-I70.0). Electronically Signed   By: Lavonia Dana M.D.   On: 04/09/2022 10:44   CT Angio Chest PE W and/or Wo Contrast  Result Date: 04/08/2022 CLINICAL DATA:  Pulmonary embolism (PE) suspected, high prob Generalized body aches and shortness of breath. Nausea. EXAM: CT ANGIOGRAPHY CHEST WITH CONTRAST TECHNIQUE: Multidetector CT imaging of the chest was performed using the standard protocol during bolus administration of intravenous contrast. Multiplanar CT image reconstructions and MIPs were obtained to evaluate the vascular anatomy. RADIATION DOSE REDUCTION: This exam was performed according to the departmental dose-optimization program which includes automated exposure control,  adjustment of the mA and/or kV according to patient size and/or use of iterative reconstruction technique. CONTRAST:  58m OMNIPAQUE IOHEXOL 350 MG/ML SOLN COMPARISON:  Chest radiograph earlier today. Noted portions from abdominopelvic CT earlier today. Chest CTA 12/24/2021 FINDINGS: Cardiovascular: There are no filling defects within the pulmonary arteries to suggest pulmonary embolus. Calcified aortic atherosclerosis. Stable aortic tortuosity. There is no contrast in the aorta. Cardiomegaly with contrast refluxing into the hepatic  veins and IVC. No pericardial effusion. Mediastinum/Nodes: 11 mm right hilar node. There are shotty mediastinal lymph nodes. Patulous esophagus. Lungs/Pleura: Moderate bilateral pleural effusions. There is associated compressive atelectasis. Additionally non dependent consolidation in the right lower lobe is atypical for passive atelectasis. Nodular and geographic ground-glass airspace disease in the right upper and middle and left upper lobes. No endobronchial lesions or debris. Calcified granuloma in the lung bases. Upper Abdomen: Assessed on abdominal CT earlier today. No new findings. Musculoskeletal: Generalized body wall edema. There are no acute or suspicious osseous abnormalities. Review of the MIP images confirms the above findings. IMPRESSION: 1. No pulmonary embolus. 2. Moderate bilateral pleural effusions with associated compressive atelectasis. Additionally non dependent consolidation in the right lower lobe is atypical for passive atelectasis, and suspicious for superimposed pneumonia. 3. Nodular and geographic ground-glass airspace disease in the right upper and middle and left upper lobes, likely infectious or inflammatory in etiology. Pulmonary edema is considered but felt much less likely. 4. Cardiomegaly with contrast refluxing into the hepatic veins and IVC consistent with elevated right heart pressures. Generalized body wall edema/anasarca. Aortic Atherosclerosis  (ICD10-I70.0). Electronically Signed   By: Keith Rake M.D.   On: 04/08/2022 21:53   CT ABDOMEN PELVIS W CONTRAST  Result Date: 04/08/2022 CLINICAL DATA:  Acute abdominal pain EXAM: CT ABDOMEN AND PELVIS WITH CONTRAST TECHNIQUE: Multidetector CT imaging of the abdomen and pelvis was performed using the standard protocol following bolus administration of intravenous contrast. RADIATION DOSE REDUCTION: This exam was performed according to the departmental dose-optimization program which includes automated exposure control, adjustment of the mA and/or kV according to patient size and/or use of iterative reconstruction technique. CONTRAST:  44m OMNIPAQUE IOHEXOL 350 MG/ML SOLN COMPARISON:  03/05/2022 FINDINGS: Lower chest: Lung bases demonstrate bilateral large pleural effusions with associated lower lobe consolidation worse on the right than the left. These changes have increased in the interval from the prior exam. Calcified granuloma is again seen in the right middle lobe. Hepatobiliary: Gallbladder has been surgically removed. Liver shows no focal mass lesion. Pancreas: Unremarkable. No pancreatic ductal dilatation or surrounding inflammatory changes. Spleen: Normal in size without focal abnormality. Adrenals/Urinary Tract: Adrenal glands are within normal limits bilaterally. Kidneys demonstrate a normal enhancement pattern. Stable left upper pole simple cyst is noted. No follow-up is recommended. No renal calculi or obstructive changes are seen. The bladder is well distended. Stomach/Bowel: Changes consistent with a decompressed Hartmann pouch are now seen. Right-sided end ileostomy is noted. Changes consistent with the subtotal colectomy are noted. Stomach is decompressed. The visualized small bowel shows no obstructive changes. Small bowel fluid is noted throughout. Vascular/Lymphatic: Aortic atherosclerosis. No enlarged abdominal or pelvic lymph nodes. Stable calcified and predominately thrombosed  splenic artery aneurysm is again noted. No findings to suggest mesenteric ischemia are noted. Reproductive: Status post hysterectomy. No adnexal masses. Other: No significant free pelvic fluid is noted. Mild changes of anasarca are seen in the lateral abdominal wall. Musculoskeletal: Degenerative changes of lumbar spine are noted. IMPRESSION: Increase in large bilateral pleural effusions with lower lobe consolidation right considerably greater than left. Interval changes consistent with subtotal colectomy and right-sided end ileostomy. No obstructive changes are seen. Electronically Signed   By: MInez CatalinaM.D.   On: 04/08/2022 20:03   DG Chest Port 1 View  Result Date: 04/08/2022 CLINICAL DATA:  Shortness of breath EXAM: PORTABLE CHEST 1 VIEW COMPARISON:  03/23/2022 FINDINGS: Cardiac shadow is stable. Aortic calcifications are again noted. Chronic pleural effusions are noted  stable from the prior study. Skin folds are noted over the left chest. Some patchy airspace opacity is noted in the right lung base adjacent to the effusion. No acute bony abnormality is seen. IMPRESSION: Chronic bilateral effusions. Patchy right basilar airspace opacity. Electronically Signed   By: Inez Catalina M.D.   On: 04/08/2022 19:00    LABORATORY DATA: Lab Results  Component Value Date   WBC 12.0 (H) 04/08/2022   HGB 7.8 (L) 04/09/2022   HCT 24.9 (L) 04/09/2022   MCV 91.1 04/08/2022   PLT 531 (H) 04/08/2022    Recent Labs  Lab 04/08/22 1825 04/09/22 0322  NA 131* 133*  K 5.7* 4.6  CL 98  --   CO2 24  --   BUN 27*  --   CREATININE 0.48  --   CALCIUM 10.2  --   PROT 7.0  --   BILITOT 0.3  --   ALKPHOS 176*  --   ALT 50*  --   AST 43*  --   GLUCOSE 84  --     Lipid Panel  Lab Results  Component Value Date   CHOL 156 12/15/2021   HDL 53 12/15/2021   LDLCALC 86 12/15/2021   LDLDIRECT 82 12/15/2021   TRIG 53 03/26/2022   CHOLHDL 2.9 12/15/2021    BNP (last 3 results) Recent Labs     03/20/22 1840 03/21/22 0737 04/08/22 1825  BNP 286.1* 117.3* 904.3*    HEMOGLOBIN A1C Lab Results  Component Value Date   HGBA1C 5.4 03/12/2022   MPG 108.28 03/12/2022    Cardiac Panel (last 3 results) Recent Labs    04/08/22 1825 04/08/22 2215  TROPONINIHS 8 7    TSH Recent Labs    12/14/21 1700 01/26/22 1447 02/28/22 1056  TSH 6.357* 3.61 4.151     CARDIAC DATABASE: EKG: 04/08/2022: Probable slow A-fib with a ventricular rate 48 bpm over accuracy limited due to baseline artifact, without underlying ischemia or injury pattern  Echocardiogram: 04/09/2022:  1. Left ventricular ejection fraction, by estimation, is 60 to 65%. The  left ventricle has normal function. The left ventricle has no regional  wall motion abnormalities. Left ventricular diastolic parameters are  indeterminate.   2. Right ventricular systolic function is normal. The right ventricular  size is normal. There is normal pulmonary artery systolic pressure. The  estimated right ventricular systolic pressure is 84.6 mmHg.   3. Left atrial size was mild to moderately dilated.   4. Right atrial size was mild to moderately dilated.   5. The mitral valve is normal in structure. Mild mitral valve  regurgitation. No evidence of mitral stenosis.   6. The aortic valve is grossly normal. There is mild calcification of the  aortic valve. There is mild thickening of the aortic valve. Aortic valve  regurgitation is trivial. Aortic valve sclerosis is present, with no  evidence of aortic valve stenosis.   7. The inferior vena cava is normal in size with greater than 50%  respiratory variability, suggesting right atrial pressure of 3 mmHg.   Comparison(s): No significant change from prior study.   Conclusion(s)/Recommendation(s): Otherwise normal echocardiogram, with  minor abnormalities described in the report.   Calcium Score:  04/11/2018 Coronary calcium score of 16. This was 32 rd percentile for age  and sex matched control.  Cardiovascular risk stratification scores: Click Here to Calculate/Change CHADS2VASc Score The patient's CHADS2-VASc score is 6, indicating a 9.7% annual risk of stroke.  Therefore, anticoagulation is recommended.  CHF History: No HTN History: No Diabetes History: No Stroke History: Yes Vascular Disease History: Yes   IMPRESSION & RECOMMENDATIONS: Julie Montgomery is a 86 y.o. Caucasian female whose past medical history and cardiovascular risk factors include: History of recurrent C. difficile colitis with recent history of abdominal perforation with subtotal colectomy and end ileostomy (02/2022) baseline dementia, paroxysmal atrial fibrillation, chronic hyponatremia, Mild coronary artery calcification, Aortic Atherosclerosis (CT 03/2019), basal cell carcinoma, bronchiectasis, history of TIA..  Impression:  Paroxysmal atrial fibrillation. Long-term oral anticoagulation. Long-term antiarrhythmic medications. Lower extremity swelling -likely secondary to low oncotic pressure due to poor nutrition status. Elevated BNP HCAP presenting with sepsis Bilateral pleural effusion status post right-sided thoracentesis Chronic hyponatremia. 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 Aortic atherosclerosis. History of TIA  Plan:  Paroxysmal atrial fibrillation: Was on Cardizem 180 mg p.o. daily along with amiodarone 200 mg p.o. daily at home. Will start Cardizem 30 mg p.o. qid and continue home dose amiodarone to hopefully avoid A-fib with RVR. Normal sinus rhythm on telemetry at the time of evaluation. Obtain EKG.  Lower extremity swelling /with elevated BNP: The possibility of underlying HFpEF cannot be entirely ruled out however, patient denies orthopnea, PND, chest x-ray does not illustrate pulmonary vascular congestion. She does have bilateral lower extremity swelling extending to  the thighs likely secondary to underlying cachexia /poor nutritional status leading to low oncotic pressures. Most recent albumin levels are within reasonable range likely due to TPN use during the last hospitalization (10 days ago).  Will start Bumex 0.5 mg p.o. twice daily. Recommend wound care consult for the bilateral blisters on the dorsum of the feet & will check lower extremity duplex rule out DVT. If wound care clears the patient and no DVT noted recommend bilateral compression stockings to help mobilize fluid.  HCAP presenting with sepsis: Chest x-ray notes bilateral pulmonary infiltrates suggestive of multifocal pneumonia. Noted to have bilateral pleural effusions status post right-sided thoracentesis. Responded well to IV fluids and currently on dopamine. CT findings less suggestive of pulmonary vascular congestion. Antibiotics and sepsis management per primary and pulmonary medicine.  Chronic hyponatremia: Continue to monitor  Total encounter time 77 minutes. *Total Encounter Time as defined by the Centers for Medicare and Medicaid Services includes, in addition to the face-to-face time of a patient visit (documented in the note above) non-face-to-face time: obtaining and reviewing outside history, ordering and reviewing medications, tests or procedures, care coordination (communications with other health care professionals or caregivers) and documentation in the medical record.  [name] questions and concerns were addressed to her satisfaction. She voices understanding of the instructions provided during this encounter.   This note was created using a voice recognition software as a result there may be grammatical errors inadvertently enclosed that do not reflect the nature of this encounter. Every attempt is made to correct such errors.  Julie Montgomery  Pager: [phone] Office: [phone] 04/09/2022, 6:45 PM

## 2022-04-09 NOTE — Progress Notes (Signed)
Echocardiogram 2D Echocardiogram has been performed.  Julie Montgomery 04/09/2022, 10:00 AM

## 2022-04-09 NOTE — Consult Note (Signed)
Consultation Note Date: 04/09/2022   Patient Name: Julie Montgomery  DOB: 01/25/1936  MRN: [id number]  Age / Sex: 86 y.o., female  PCP: Plotnikov, Evie Lacks, MD Referring Physician: Alma Friendly, MD  Reason for Consultation: Establishing goals of care  HPI/Patient Profile: 86 y.o. female  with past medical history of dementia, recurrent C. difficile infection, permanent A-fib on Eliquis, chronic hyponatremia,  failure to thrive, stroke, H. pylori, abdominal perforation with subtotal colectomy and end ileostomy (03/21/2022) during recent prolonged hospitalization 03/05/22-03/30/22  admitted on 04/08/2022 with shortness of breath and bodyaches.   Patient was initially seen at [facility] ED and transferred to [facility] upon recommendation of critical care.  She is now admitted with healthcare acquired pneumonia, sepsis, acute hypoxic respiratory failure, acute diastolic heart failure exacerbation. PMT has been consulted to assist with goals of care conversation.  Patient is familiar to our service from her last hospitalization.  Clinical Assessment and Goals of Care:  I have reviewed medical records including EPIC notes, labs and imaging, assessed the patient and then had a phone conversation with patient's husband Timmothy Sours to discuss diagnosis prognosis, [place], EOL wishes, disposition and options.  I introduced Palliative Medicine as specialized medical care for people living with serious illness. It focuses on providing relief from the symptoms and stress of a serious illness. The goal is to improve quality of life for both the patient and the family.  We discussed a brief life review of the patient and then focused on their current illness.   I attempted to elicit values and goals of care important to the patient.    Medical History Review and Understanding:  We discussed interval history since patient's latest  admission, overall failure to thrive after several months of ongoing complications, and current admission for acute respiratory failure and sepsis.  Reviewed patient's labs and echo results.  Social History: Patient lives at home with her husband.  They have 2 sons who are very involved.  She is very spiritual.  Functional and Nutritional State: Per husband, patient was home from SNF for about a week and was able to stand/walk short distances with her walker after being assisted up for transfers.  She has had a poor appetite.  Albumin at 3.8 on 12/10.  Code Status: Concepts specific to code status, artifical feeding and hydration, and rehospitalization were considered and discussed.   Discussion: Called patient's husband to arrange a family meeting to discuss goals of care and provide palliative support.  He shares that he and patient's son [name] have just left the hospital after being here through most of yesterday and last night.  They plan to come back after she gets a room upstairs and are agreeable to meeting in person tomorrow for a more in-depth discussion to include her son(s).  I shared with Timmothy Sours that when I assessed patient in the ED today, she is quite tired and speaking to me as if I were him.  She stated "have a life and a relationship, look after the kids, it has been great and thank you for everything."  Emotional support and therapeutic listening was provided.  I explored any further conversations that may have occurred between them on her end-of-life wishes since our last conversation, understanding that she was very clear on her desire for aggressive interventions at that time but that her feelings could change.  They have not had any more conversations, but he has been speaking with their sons about decisions on her behalf including  CODE STATUS.  He is very concerned that CPR would only make her suffer greater and they plan to continue discussing this today.  I shared that this is my  worry as well.  They have open availability tomorrow to debrief on the conversation and discuss next steps.  Discussed the importance of continued conversation with family and the medical providers regarding overall plan of care and treatment options, ensuring decisions are within the context of the patient's values and GOCs.   Questions and concerns were addressed.  Hard Choices booklet was previously left for review. The family was encouraged to call with questions or concerns.  PMT will continue to support holistically.   SUMMARY OF RECOMMENDATIONS   -Continue full code/full scope for now -Tentative family meeting tomorrow 10/12 for goals of care discussion -Psychosocial and emotional support provided -PMT will continue to follow and support  Prognosis:  Poor long-term prognosis given recurrent acute illnesses/hospitalizations, chronic debility and failure to thrive  Discharge Planning: To Be Determined      Primary Diagnoses: Present on Admission:  HCAP (healthcare-associated pneumonia)  Physical Exam Vitals and nursing note reviewed.  Constitutional:      General: She is not in acute distress.    Appearance: She is cachectic. She is ill-appearing.     Interventions: Nasal cannula in place.  Cardiovascular:     Rate and Rhythm: Normal rate.  Pulmonary:     Effort: Pulmonary effort is normal. No respiratory distress.  Neurological:     Mental Status: She is easily aroused. She is lethargic.     Motor: Weakness present.    Vital Signs: BP 132/62   Pulse 78   Temp 98.1 F (36.7 C) (Oral)   Resp 19   SpO2 98%  Pain Scale: 0-10   Pain Score: 10-Worst pain ever   SpO2: SpO2: 98 % O2 Device:SpO2: 98 % O2 Flow Rate: .O2 Flow Rate (L/min): 3 L/min  Palliative Assessment/Data: 50% at best     MDM: high   Lesa Vandall Johnnette Litter, PA-C  Palliative Medicine Team Team phone # [phone]  Thank you for allowing the Palliative Medicine Team to assist in the care of  this patient. Please utilize secure chat with additional questions, if there is no response within 30 minutes please call the above phone number.  Palliative Medicine Team providers are available by phone from 7am to 7pm daily and can be reached through the team cell phone.  Should this patient require assistance outside of these hours, please call the patient's attending physician.

## 2022-04-09 NOTE — Progress Notes (Signed)
NAME:  Julie Montgomery, MRN:  [id number], DOB:  06-20-1935, LOS: 0 ADMISSION DATE:  04/08/2022, CONSULTATION DATE:  04/08/2022 REFERRING MD:  Armandina Gemma - EDP, CHIEF COMPLAINT:  SOB, body aches, nausea   History of Present Illness:  86 year old woman who presented to [place] ED 12/10 with SOB, body aches, nausea and lethargy with FTT. 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 She has been hospitalized 5 times in 2023. Most recent, patient was discharged to [facility] 12/1.  On ED arrival, patient was pleasantly demented/slightly disoriented and c/o back pain. Bradycardic on tele with HR 40s-50s. BP 90s/70s. SpO2 80% on RA, improved to 100% on 2LNC. Labs were notable for WBC 12.0, BNP 904, K 5.7. Lasix '40mg'$  IV administered by EDP. Cardiology consulted with plan for transfer to [facility] for further care.  CTA PE protocol obtained 12/10 prior to transfer was negative for PE; demonstrated moderate pleural effusions and compressive atelectasis and nondependent RLL consolidation c/f PNA, GGOs of the RUL, RML and LUL (infectious vs inflammatory), anasarca.  Transferred from [facility] to [facility]. PCCM consulted; initial plan for admission to [facility] but patient required dopamine gtt initiation for   Pertinent Medical History:  Dementia, recurrent C. difficile infection, permanent A-fib on Eliquis, chronic hyponatremia, failure to thrive, stroke, H. pylori, abdominal perforation with subtotal colectomy and end ileostomy (03/21/2022)  Significant Hospital Events: Including procedures, antibiotic start and stop dates in addition to other pertinent events   12/10 Presented to [facility] ED with SOB, malaise/fatigue, lethargy and FTT. CTA Chest negative for PE but +bilateral moderate pleural effusions and GGOs of RUL/RML/LUL. Transferred to [facility] for  further evaluation. 12/11 PCCM reconsulted as patient placed on dopamine  Interim History / Subjective:  No significant events Pressure briefly improved with volume resuscitation to 110s/50s Dopamine gtt was initiated  PCCM reconsulted for ICU admission  Objective:  Blood pressure (!) 116/54, pulse 68, temperature 97.6 F (36.4 C), temperature source Oral, resp. rate 18, SpO2 100 %.        Intake/Output Summary (Last 24 hours) at 04/09/2022 1758 Last data filed at 04/09/2022 1657 Gross per 24 hour  Intake 164.87 ml  Output 950 ml  Net -785.13 ml   There were no vitals filed for this visit.  Physical Examination: General: Chronically ill-appearing elderly woman in NAD. Appears uncomfortable. HEENT: [place]/AT, anicteric sclera, PERRL 2.8m, dry mucous membranes. Neuro: Lethargic, but wakes easily to voice. Responds to verbal stimuli. Following commands consistently. Moves all 4 extremities spontaneously. Profound generalized weakness. CV: Bradycardic, regular rhythm, no m/g/r. PULM: Breathing even and unlabored on 3LNC. Lung fields diminished throughout, most notably at bilateral bases. GI: Soft, nontender, mildly distended. Well-healing midline abdominal wound with steri-strips in place, no erythema or drainage. Colostomy present to RLQ with dark brown liquid stool. Hypoactive bowel sounds. Extremities: Bilateral symmetric 2+ pitting LE edema noted. Skin: Warm/dry, no rashes. Bilateral LE shiny and taut due to swelling. Dorsal aspect of bilateral feet with large healing blisters on a erythematous base, dry.  Resolved Hospital Problem List:    Assessment & Plan:  Undifferentiated shock, suspect septic and hypovolemic Patient with lethargy, WBC 12.0, bradycardic, recent d/c from SNF, possible aspiration/PNA on CTA Chest. CT A/P negative for intrabdominal source. FTT with poor PO intake at home. - Admit to ICU for close monitoring - Goal MAP > 65 - Fluid resuscitation as tolerated,  albumin  5% 571m given - Dopamine titrated to goal MAP, low threshold for transition to Levophed - Trend WBC, fever curve, LA, PCT - F/u Cx data, legionella; urine strep negative - Continue broad-spectrum antibiotics (cefepime, vanc, azithromycin)  Acute respiratory failure with hypoxia Chronic pleural effusion Spo2 89% on room air in ED. CXR and CT with chronic pleural effusions with right lower lobe infiltrate.  Etiology of pleural effusions favored to be related to recent prolonged hospital stay related to administration of IV fluids, severe malnutrition and hypoalbuminemia leading to third spacing, increase salt load with IV fluids lead to fluid retention. She had bilateral pleural effusions on most recent chest imaging prior to discharge last month. RVP/COVID & Flu negative. - Continue supplemental O2 support - Wean O2 for sat > 88% - Pulmonary hygiene - S/p R thoracentesis with IR 12/11, 6071mfluid removed - No pleural fluid studies sent  Bradycardia Atrial fibrillation on apixaban BNP elevation Echo 12/11 with EF 60-65, normal RV systolic function. - Cardiology consulted, appreciate recommendations - Continue Eliquis at reduced dose - Hold diltiazem - PO amio as hemodynamics permit  Hyperkalemia Chronic hyponatremia - Trend BMP - Replete electrolytes as indicated - Monitor I&Os - F/u urine studies - Avoid nephrotoxic agents as able - Ensure adequate renal perfusion  Right-sided end ileostomy secondary to acute intestinal perforation secondary to C. difficile toxic megacolon on 11/22 - WOC consult - Vanc '125mg'$  BID PO  Dementia Hx Stroke - Fall precautions - Namenda '5mg'$  BID  Failure to thrive Five hospital admissions this year with multiple significant insults. - PMT consulted, appreciate involvement - Patient wishes to remain FULL CODE at this time - Ongoing GOC discussions pending progress - Prognosis overall remains poor  Best Practice: (right click and  "Reselect all SmartList Selections" daily)   Diet/type: Regular consistency (see orders) DVT prophylaxis: SCDs, Eliquis GI prophylaxis: N/A Central venous access:  N/A Foley:  N/A Code Status:  full code Last date of multidisciplinary goals of care discussion [12/11 - ED bedside discussion with patient, husband, son, CCM. Patient states that if she were to become more critically ill, she wants to "live as long as God gives me breath" and wants to "fight and live".]  Critical care time:    The patient is critically ill with multiple organ system failure and requires high complexity decision making for assessment and support, frequent evaluation and titration of therapies, advanced monitoring, review of radiographic studies and interpretation of complex data.   Critical Care Time devoted to patient care services, exclusive of separately billable procedures, described in this note is 37 minutes.  StLestine MountPA-C [facility] 04/09/22 5:58 PM  Please see [email or web address] for pager details.  From 7A-7P if no response, please call [phone] After hours, please call ELink [phone]

## 2022-04-09 NOTE — Procedures (Signed)
Ultrasound-guided  therapeutic right sided thoracentesis performed yielding 600 milliliters of straw colored fluid. No immediate complications.  Follow-up chest x-ray pending. EBL is < 2 ml.

## 2022-04-09 NOTE — Progress Notes (Signed)
PROGRESS NOTE  Julie Montgomery ACZ:[id number] DOB: [birth year] DOA: 04/08/2022 PCP: Cassandria Anger, MD  HPI/Recap of past 24 hours: Julie Montgomery is a 86 y.o. female with medical history significant of  dementia, recurrent C. difficile infection, permanent A-fib on Eliquis, chronic hyponatremia,  failure to thrive, stroke, H. pylori, abdominal perforation with subtotal colectomy and end ileostomy (03/21/2022) during recent prolonged hospitalization 03/05/22-03/30/22 who presents to ED with SOB x24 hours associated with poor appetite and generalized malaise and worsening edema.  Patient noted to desaturate to the 80s requiring oxygen in the ED.  Patient also noted to be hypotensive, critical care was consulted, but recommended medicine admit.  Patient admitted for further management.   Today, patient still noted to be hypotensive with blood pressure gradually improving on pressors (started by night MD), still requiring oxygen, appears very uncomfortable reporting significant back pain.  Son and husband at bedside.   Assessment/Plan: Principal Problem:   HCAP (healthcare-associated pneumonia)   Acute hypoxic respiratory failure likely 2/2 ??HCAP Bilateral pleural effusions Currently on 2 L of O2 saturating well Afebrile, with ongoing fluctuating leukocytosis Lactic acid WNL Procalcitonin negative Urine strep pneumo, Legionella pending Chest X-ray with bibasilar pleural effusions, with possible multifocal pneumonia Continue IV vanc/cefepime/azithromycin  Monitor closely  ???Septic shock Vs likely significant third spacing  Generalized anasarca BP soft, MAP 55-70 on pressors Received about 1L bolus of IV fluids, noted to have about 2+ of BLE BC x 2 pending LA WNL, procalcitonin negative UA negative Troponin negative Echo with normal EF, BNP 904, albumin 3.8 Given albumin + IVF, with 1 dose of lasix, with minimal response Was started on ??dopamine overnight PCCM reconsulted  due to requiring high doses of pressors and further management  Bilateral pleural effusions Noted to be chronic on chest x-ray S/p thoracentesis on 12/11 by IR/US, fluid analysis pending Continue O2 supplementation   Chronic hyponatremia Likely overload state Daily BMP   Hx of P. Atrial fib  ?tachy/brady syndrome HR stable Hold av nodal agent  Continue on Eliquis    Hx of toxic megacolon due to C-dif  S/p resection and right-sided ileostomy s/p perforation Continue oral vanc    Sacral Decub Wound care consulted  Supportive pain medication   Hx of CVA Continue on secondary ppx Eliquis     Dementia Resume home regimen   Goals of care discussion Patient with very poor prognosis, multiple comorbidities Discussed with son and husband at bedside, want full scope of treatment and open to have further conversations Palliative consulted, appreciate      Estimated body mass index is 19.52 kg/m as calculated from the following:   Height as of 03/05/22: '5\' 2"'$  (1.575 m).   Weight as of 03/30/22: 48.4 kg.     Code Status: Full  Family Communication: Son and husband at bedside  Disposition Plan: Status is: Inpatient Remains inpatient appropriate because: Level of care      Consultants: PCCM  Procedures: S/p thoracentesis on 12/11  Antimicrobials: Vancomycin (oral/IV) Cefepime Azithromycin  DVT prophylaxis: Eliquis   Objective: Vitals:   04/09/22 1300 04/09/22 1345 04/09/22 1445 04/09/22 1500  BP: 133/65 131/63 (!) 102/56 (!) 102/58  Pulse: 82 82 75 67  Resp: '20 14 16 16  '$ Temp:      TempSrc:      SpO2: 100% 100% 98% 100%    Intake/Output Summary (Last 24 hours) at 04/09/2022 1510 Last data filed at 04/09/2022 1307 Gross per 24 hour  Intake 100 ml  Output 950 ml  Net -850 ml   There were no vitals filed for this visit.  Exam: General: NAD, lethargic, acutely ill-appearing Cardiovascular: S1, S2 present Respiratory: Bilateral rhonchi  noted Abdomen: Soft, nontender, nondistended, bowel sounds present, noted surgical scar, C/D/I, staples removed Musculoskeletal: 2+ bilateral pedal edema noted Skin: Normal Psychiatry: Fair mood     Data Reviewed: CBC: Recent Labs  Lab 04/08/22 1825 04/09/22 0322 04/09/22 0430  WBC 12.0*  --   --   NEUTROABS 9.8*  --   --   HGB 9.5* 8.2* 7.8*  HCT 29.6* 24.0* 24.9*  MCV 91.1  --   --   PLT 531*  --   --    Basic Metabolic Panel: Recent Labs  Lab 04/08/22 1825 04/09/22 0322  NA 131* 133*  K 5.7* 4.6  CL 98  --   CO2 24  --   GLUCOSE 84  --   BUN 27*  --   CREATININE 0.48  --   CALCIUM 10.2  --    GFR: Estimated Creatinine Clearance: 40 mL/min (by C-G formula based on SCr of 0.48 mg/dL). Liver Function Tests: Recent Labs  Lab 04/08/22 1825  AST 43*  ALT 50*  ALKPHOS 176*  BILITOT 0.3  PROT 7.0  ALBUMIN 3.8   Recent Labs  Lab 04/08/22 1825  LIPASE 30   No results for input(s): "AMMONIA" in the last 168 hours. Coagulation Profile: Recent Labs  Lab 04/08/22 1825  INR 1.1   Cardiac Enzymes: No results for input(s): "CKTOTAL", "CKMB", "CKMBINDEX", "TROPONINI" in the last 168 hours. BNP (last 3 results) Recent Labs    01/26/22 1447  PROBNP 68.0   HbA1C: No results for input(s): "HGBA1C" in the last 72 hours. CBG: Recent Labs  Lab 04/08/22 1757  GLUCAP 86   Lipid Profile: No results for input(s): "CHOL", "HDL", "LDLCALC", "TRIG", "CHOLHDL", "LDLDIRECT" in the last 72 hours. Thyroid Function Tests: No results for input(s): "TSH", "T4TOTAL", "FREET4", "T3FREE", "THYROIDAB" in the last 72 hours. Anemia Panel: No results for input(s): "VITAMINB12", "FOLATE", "FERRITIN", "TIBC", "IRON", "RETICCTPCT" in the last 72 hours. Urine analysis:    Component Value Date/Time   COLORURINE STRAW (A) 04/09/2022 0430   APPEARANCEUR CLEAR 04/09/2022 0430   LABSPEC 1.025 04/09/2022 0430   PHURINE 5.0 04/09/2022 0430   GLUCOSEU NEGATIVE 04/09/2022 0430    GLUCOSEU NEGATIVE 04/05/2021 1236   HGBUR NEGATIVE 04/09/2022 0430   BILIRUBINUR NEGATIVE 04/09/2022 0430   KETONESUR NEGATIVE 04/09/2022 0430   PROTEINUR NEGATIVE 04/09/2022 0430   UROBILINOGEN 0.2 04/05/2021 1236   NITRITE NEGATIVE 04/09/2022 0430   LEUKOCYTESUR NEGATIVE 04/09/2022 0430   Sepsis Labs: '@LABRCNTIP'$ (procalcitonin:4,lacticidven:4)  ) Recent Results (from the past 240 hour(s))  Resp panel by RT-PCR (RSV, Flu A&B, Covid) Anterior Nasal Swab     Status: None   Collection Time: 04/08/22  6:05 PM   Specimen: Anterior Nasal Swab  Result Value Ref Range Status   SARS Coronavirus 2 by RT PCR NEGATIVE NEGATIVE Final    Comment: (NOTE) SARS-CoV-2 target nucleic acids are NOT DETECTED.  The SARS-CoV-2 RNA is generally detectable in upper respiratory specimens during the acute phase of infection. The lowest concentration of SARS-CoV-2 viral copies this assay can detect is 138 copies/mL. A negative result does not preclude SARS-Cov-2 infection and should not be used as the sole basis for treatment or other patient management decisions. A negative result may occur with  improper specimen collection/handling, submission of specimen other than nasopharyngeal swab, presence of viral  mutation(s) within the areas targeted by this assay, and inadequate number of viral copies(<138 copies/mL). A negative result must be combined with clinical observations, patient history, and epidemiological information. The expected result is Negative.  Fact Sheet for Patients:  EntrepreneurPulse.com.au  Fact Sheet for Healthcare Providers:  IncredibleEmployment.be  This test is no t yet approved or cleared by the [place] FDA and  has been authorized for detection and/or diagnosis of SARS-CoV-2 by FDA under an Emergency Use Authorization (EUA). This EUA will remain  in effect (meaning this test can be used) for the duration of the COVID-19 declaration  under Section 564(b)(1) of the Act, 21 U.S.C.section 360bbb-3(b)(1), unless the authorization is terminated  or revoked sooner.       Influenza A by PCR NEGATIVE NEGATIVE Final   Influenza B by PCR NEGATIVE NEGATIVE Final    Comment: (NOTE) The Xpert Xpress SARS-CoV-2/FLU/RSV plus assay is intended as an aid in the diagnosis of influenza from Nasopharyngeal swab specimens and should not be used as a sole basis for treatment. Nasal washings and aspirates are unacceptable for Xpert Xpress SARS-CoV-2/FLU/RSV testing.  Fact Sheet for Patients: EntrepreneurPulse.com.au  Fact Sheet for Healthcare Providers: IncredibleEmployment.be  This test is not yet approved or cleared by the [place] FDA and has been authorized for detection and/or diagnosis of SARS-CoV-2 by FDA under an Emergency Use Authorization (EUA). This EUA will remain in effect (meaning this test can be used) for the duration of the COVID-19 declaration under Section 564(b)(1) of the Act, 21 U.S.C. section 360bbb-3(b)(1), unless the authorization is terminated or revoked.     Resp Syncytial Virus by PCR NEGATIVE NEGATIVE Final    Comment: (NOTE) Fact Sheet for Patients: EntrepreneurPulse.com.au  Fact Sheet for Healthcare Providers: IncredibleEmployment.be  This test is not yet approved or cleared by the [place] FDA and has been authorized for detection and/or diagnosis of SARS-CoV-2 by FDA under an Emergency Use Authorization (EUA). This EUA will remain in effect (meaning this test can be used) for the duration of the COVID-19 declaration under Section 564(b)(1) of the Act, 21 U.S.C. section 360bbb-3(b)(1), unless the authorization is terminated or revoked.  Performed at [facility], [address] [place], [place] [address]   Respiratory (~20 pathogens) panel by PCR     Status: None   Collection Time:  04/09/22  4:30 AM   Specimen: Nasopharyngeal Swab; Respiratory  Result Value Ref Range Status   Adenovirus NOT DETECTED NOT DETECTED Final   Coronavirus 229E NOT DETECTED NOT DETECTED Final    Comment: (NOTE) The Coronavirus on the Respiratory Panel, DOES NOT test for the novel  Coronavirus (2019 nCoV)    Coronavirus HKU1 NOT DETECTED NOT DETECTED Final   Coronavirus NL63 NOT DETECTED NOT DETECTED Final   Coronavirus OC43 NOT DETECTED NOT DETECTED Final   Metapneumovirus NOT DETECTED NOT DETECTED Final   Rhinovirus / Enterovirus NOT DETECTED NOT DETECTED Final   Influenza A NOT DETECTED NOT DETECTED Final   Influenza B NOT DETECTED NOT DETECTED Final   Parainfluenza Virus 1 NOT DETECTED NOT DETECTED Final   Parainfluenza Virus 2 NOT DETECTED NOT DETECTED Final   Parainfluenza Virus 3 NOT DETECTED NOT DETECTED Final   Parainfluenza Virus 4 NOT DETECTED NOT DETECTED Final   Respiratory Syncytial Virus NOT DETECTED NOT DETECTED Final   Bordetella pertussis NOT DETECTED NOT DETECTED Final   Bordetella Parapertussis NOT DETECTED NOT DETECTED Final   Chlamydophila pneumoniae NOT DETECTED NOT DETECTED Final   Mycoplasma pneumoniae  NOT DETECTED NOT DETECTED Final    Comment: Performed at [facility], [place] [address], [place], [place] [address]  Culture, blood (Routine X 2) w Reflex to ID Panel     Status: None (Preliminary result)   Collection Time: 04/09/22  4:30 AM   Specimen: BLOOD RIGHT FOREARM  Result Value Ref Range Status   Specimen Description BLOOD RIGHT FOREARM  Final   Special Requests   Final    BOTTLES DRAWN AEROBIC AND ANAEROBIC Blood Culture results may not be optimal due to an inadequate volume of blood received in culture bottles   Culture   Final    NO GROWTH < 12 HOURS Performed at [facility], [place] [address] [place], [place] [address]    Report Status PENDING  Incomplete  Culture, blood (Routine X 2) w Reflex to ID Panel     Status: None (Preliminary  result)   Collection Time: 04/09/22  4:35 AM   Specimen: BLOOD  Result Value Ref Range Status   Specimen Description BLOOD LEFT ANTECUBITAL  Final   Special Requests   Final    BOTTLES DRAWN AEROBIC AND ANAEROBIC Blood Culture adequate volume   Culture   Final    NO GROWTH < 12 HOURS Performed at [facility], [place] [address] [place], [place] [address]    Report Status PENDING  Incomplete      Studies: ECHOCARDIOGRAM COMPLETE  Result Date: 04/09/2022    ECHOCARDIOGRAM REPORT   Patient Name:   LANEYA GASAWAY Date of Exam: 04/09/2022 Medical Rec #:  [id number]       Height:       62.0 in Accession #:    [id number]      Weight:       106.7 lb Date of Birth:  12-08-1935      BSA:          1.464 m Patient Age:    29 years        BP:           105/41 mmHg Patient Gender: F               HR:           60 bpm. Exam Location:  Inpatient Procedure: 2D Echo, Cardiac Doppler, Color Doppler and 3D Echo Indications:    CHF-Acute Diastolic W29.93  History:        Patient has prior history of Echocardiogram examinations, most                 recent 03/29/2022. Stroke; Arrythmias:Atrial Fibrillation.  Sonographer:    Bernadene Person RDCS Referring Phys: [id number] [facility] A THOMAS  Sonographer Comments: Image acquisition challenging due to patient body habitus. IMPRESSIONS  1. Left ventricular ejection fraction, by estimation, is 60 to 65%. The left ventricle has normal function. The left ventricle has no regional wall motion abnormalities. Left ventricular diastolic parameters are indeterminate.  2. Right ventricular systolic function is normal. The right ventricular size is normal. There is normal pulmonary artery systolic pressure. The estimated right ventricular systolic pressure is 93.8 mmHg.  3. Left atrial size was mild to moderately dilated.  4. Right atrial size was mild to moderately dilated.  5. The mitral valve is normal in structure. Mild mitral valve regurgitation. No evidence of mitral  stenosis.  6. The aortic valve is grossly normal. There is mild calcification of the aortic valve. There is mild thickening of the aortic valve. Aortic  valve regurgitation is trivial. Aortic valve sclerosis is present, with no evidence of aortic valve stenosis.  7. The inferior vena cava is normal in size with greater than 50% respiratory variability, suggesting right atrial pressure of 3 mmHg. Comparison(s): No significant change from prior study. Conclusion(s)/Recommendation(s): Otherwise normal echocardiogram, with minor abnormalities described in the report. FINDINGS  Left Ventricle: Left ventricular ejection fraction, by estimation, is 60 to 65%. The left ventricle has normal function. The left ventricle has no regional wall motion abnormalities. The left ventricular internal cavity size was normal in size. There is  no left ventricular hypertrophy. Left ventricular diastolic parameters are indeterminate. Right Ventricle: The right ventricular size is normal. No increase in right ventricular wall thickness. Right ventricular systolic function is normal. There is normal pulmonary artery systolic pressure. The tricuspid regurgitant velocity is 2.30 m/s, and  with an assumed right atrial pressure of 3 mmHg, the estimated right ventricular systolic pressure is 08.6 mmHg. Left Atrium: Left atrial size was mild to moderately dilated. Right Atrium: Right atrial size was mild to moderately dilated. Pericardium: There is no evidence of pericardial effusion. Mitral Valve: The mitral valve is normal in structure. Mild to moderate mitral annular calcification. Mild mitral valve regurgitation. No evidence of mitral valve stenosis. MV peak gradient, 4.0 mmHg. The mean mitral valve gradient is 2.0 mmHg. Tricuspid Valve: The tricuspid valve is normal in structure. Tricuspid valve regurgitation is mild . No evidence of tricuspid stenosis. Aortic Valve: The aortic valve is grossly normal. There is mild calcification of the  aortic valve. There is mild thickening of the aortic valve. Aortic valve regurgitation is trivial. Aortic valve sclerosis is present, with no evidence of aortic valve stenosis. Pulmonic Valve: The pulmonic valve was not well visualized. Pulmonic valve regurgitation is not visualized. No evidence of pulmonic stenosis. Aorta: The aortic root, ascending aorta, aortic arch and descending aorta are all structurally normal, with no evidence of dilitation or obstruction. Venous: The inferior vena cava is normal in size with greater than 50% respiratory variability, suggesting right atrial pressure of 3 mmHg. IAS/Shunts: The atrial septum is grossly normal. Additional Comments: There is pleural effusion in both left and right lateral regions.  LEFT VENTRICLE PLAX 2D LVIDd:         4.80 cm      Diastology LVIDs:         3.00 cm      LV e' medial:    8.49 cm/s LV PW:         0.60 cm      LV E/e' medial:  10.1 LV IVS:        0.60 cm      LV e' lateral:   7.62 cm/s LVOT diam:     1.80 cm      LV E/e' lateral: 11.2 LV SV:         82 LV SV Index:   56 LVOT Area:     2.54 cm                              3D Volume EF: LV Volumes (MOD)            3D EF:        60 % LV vol d, MOD A2C: 114.0 ml LV EDV:       118 ml LV vol d, MOD A4C: 85.2 ml  LV ESV:       47  ml LV vol s, MOD A2C: 42.4 ml  LV SV:        71 ml LV vol s, MOD A4C: 30.2 ml LV SV MOD A2C:     71.6 ml LV SV MOD A4C:     85.2 ml LV SV MOD BP:      64.2 ml RIGHT VENTRICLE RV S prime:     12.20 cm/s TAPSE (M-mode): 2.8 cm LEFT ATRIUM             Index        RIGHT ATRIUM           Index LA diam:        4.00 cm 2.73 cm/m   RA Area:     17.60 cm LA Vol (A2C):   55.2 ml 37.71 ml/m  RA Volume:   49.50 ml  33.82 ml/m LA Vol (A4C):   55.0 ml 37.58 ml/m LA Biplane Vol: 60.5 ml 41.33 ml/m  AORTIC VALVE LVOT Vmax:   126.00 cm/s LVOT Vmean:  84.400 cm/s LVOT VTI:    0.322 m  AORTA Ao Root diam: 2.60 cm Ao Asc diam:  3.10 cm MITRAL VALVE                  TRICUSPID VALVE MV Area  (PHT): 2.76 cm       TR Peak grad:   21.2 mmHg MV Area VTI:   2.25 cm       TR Vmax:        230.00 cm/s MV Peak grad:  4.0 mmHg MV Mean grad:  2.0 mmHg       SHUNTS MV Vmax:       1.00 m/s       Systemic VTI:  0.32 m MV Vmean:      63.0 cm/s      Systemic Diam: 1.80 cm MV Decel Time: 275 msec MR Peak grad:    76.7 mmHg MR Mean grad:    56.5 mmHg MR Vmax:         438.00 cm/s MR Vmean:        363.0 cm/s MR PISA:         0.57 cm MR PISA Eff ROA: 5 mm MR PISA Radius:  0.30 cm MV E velocity: 85.40 cm/s MV A velocity: 73.90 cm/s MV E/A ratio:  1.16 Buford Dresser MD Electronically signed by Buford Dresser MD Signature Date/Time: 04/09/2022/11:15:01 AM    Final    IR THORACENTESIS ASP PLEURAL SPACE W/IMG GUIDE  Result Date: 04/09/2022 INDICATION: 86 year old female. History of AFib. Presented to the ED with shortness of breath and productive cough. Found to have bilateral pleural effusions. Request is for therapeutic thoracentesis. Right is greater than EXAM: ULTRASOUND GUIDED THERAPEUTIC RIGHT-SIDED  THORACENTESIS MEDICATIONS: Lidocaine 1% 10 mL COMPLICATIONS: None immediate. PROCEDURE: An ultrasound guided thoracentesis was thoroughly discussed with the patient and questions answered. The benefits, risks, alternatives and complications were also discussed. The patient understands and wishes to proceed with the procedure. Written consent was obtained. Ultrasound was performed to localize and mark an adequate pocket of fluid in the right chest. The area was then prepped and draped in the normal sterile fashion. 1% Lidocaine was used for local anesthesia. Under ultrasound guidance a 6 Fr Safe-T-Centesis catheter was introduced. Thoracentesis was performed. The catheter was removed and a dressing applied. FINDINGS: A total of approximately 600 mL of amber fluid was removed. IMPRESSION: Successful ultrasound guided right-sided therapeutic thoracentesis yielding 600 mL  of pleural fluid. Read by:  Rushie Nyhan, NP Electronically Signed   By: Ruthann Cancer M.D.   On: 04/09/2022 10:59   DG Chest 1 View  Result Date: 04/09/2022 CLINICAL DATA:  Post thoracentesis EXAM: CHEST  1 VIEW COMPARISON:  04/08/2022 FINDINGS: Upper normal heart size. Atherosclerotic calcification aorta. Bibasilar pleural effusions. Scattered BILATERAL pulmonary infiltrates favoring multifocal pneumonia. No pneumothorax. Bones demineralized with chronic RIGHT rotator cuff tear. IMPRESSION: No pneumothorax following thoracentesis. Patchy BILATERAL pulmonary infiltrates favoring multifocal pneumonia. BILATERAL pleural effusions. Aortic Atherosclerosis (ICD10-I70.0). Electronically Signed   By: Lavonia Dana M.D.   On: 04/09/2022 10:44   CT Angio Chest PE W and/or Wo Contrast  Result Date: 04/08/2022 CLINICAL DATA:  Pulmonary embolism (PE) suspected, high prob Generalized body aches and shortness of breath. Nausea. EXAM: CT ANGIOGRAPHY CHEST WITH CONTRAST TECHNIQUE: Multidetector CT imaging of the chest was performed using the standard protocol during bolus administration of intravenous contrast. Multiplanar CT image reconstructions and MIPs were obtained to evaluate the vascular anatomy. RADIATION DOSE REDUCTION: This exam was performed according to the departmental dose-optimization program which includes automated exposure control, adjustment of the mA and/or kV according to patient size and/or use of iterative reconstruction technique. CONTRAST:  1m OMNIPAQUE IOHEXOL 350 MG/ML SOLN COMPARISON:  Chest radiograph earlier today. Noted portions from abdominopelvic CT earlier today. Chest CTA 12/24/2021 FINDINGS: Cardiovascular: There are no filling defects within the pulmonary arteries to suggest pulmonary embolus. Calcified aortic atherosclerosis. Stable aortic tortuosity. There is no contrast in the aorta. Cardiomegaly with contrast refluxing into the hepatic veins and IVC. No pericardial effusion. Mediastinum/Nodes: 11 mm  right hilar node. There are shotty mediastinal lymph nodes. Patulous esophagus. Lungs/Pleura: Moderate bilateral pleural effusions. There is associated compressive atelectasis. Additionally non dependent consolidation in the right lower lobe is atypical for passive atelectasis. Nodular and geographic ground-glass airspace disease in the right upper and middle and left upper lobes. No endobronchial lesions or debris. Calcified granuloma in the lung bases. Upper Abdomen: Assessed on abdominal CT earlier today. No new findings. Musculoskeletal: Generalized body wall edema. There are no acute or suspicious osseous abnormalities. Review of the MIP images confirms the above findings. IMPRESSION: 1. No pulmonary embolus. 2. Moderate bilateral pleural effusions with associated compressive atelectasis. Additionally non dependent consolidation in the right lower lobe is atypical for passive atelectasis, and suspicious for superimposed pneumonia. 3. Nodular and geographic ground-glass airspace disease in the right upper and middle and left upper lobes, likely infectious or inflammatory in etiology. Pulmonary edema is considered but felt much less likely. 4. Cardiomegaly with contrast refluxing into the hepatic veins and IVC consistent with elevated right heart pressures. Generalized body wall edema/anasarca. Aortic Atherosclerosis (ICD10-I70.0). Electronically Signed   By: MKeith RakeM.D.   On: 04/08/2022 21:53   CT ABDOMEN PELVIS W CONTRAST  Result Date: 04/08/2022 CLINICAL DATA:  Acute abdominal pain EXAM: CT ABDOMEN AND PELVIS WITH CONTRAST TECHNIQUE: Multidetector CT imaging of the abdomen and pelvis was performed using the standard protocol following bolus administration of intravenous contrast. RADIATION DOSE REDUCTION: This exam was performed according to the departmental dose-optimization program which includes automated exposure control, adjustment of the mA and/or kV according to patient size and/or use of  iterative reconstruction technique. CONTRAST:  776mOMNIPAQUE IOHEXOL 350 MG/ML SOLN COMPARISON:  03/05/2022 FINDINGS: Lower chest: Lung bases demonstrate bilateral large pleural effusions with associated lower lobe consolidation worse on the right than the left. These changes have increased in the interval from the prior exam.  Calcified granuloma is again seen in the right middle lobe. Hepatobiliary: Gallbladder has been surgically removed. Liver shows no focal mass lesion. Pancreas: Unremarkable. No pancreatic ductal dilatation or surrounding inflammatory changes. Spleen: Normal in size without focal abnormality. Adrenals/Urinary Tract: Adrenal glands are within normal limits bilaterally. Kidneys demonstrate a normal enhancement pattern. Stable left upper pole simple cyst is noted. No follow-up is recommended. No renal calculi or obstructive changes are seen. The bladder is well distended. Stomach/Bowel: Changes consistent with a decompressed Hartmann pouch are now seen. Right-sided end ileostomy is noted. Changes consistent with the subtotal colectomy are noted. Stomach is decompressed. The visualized small bowel shows no obstructive changes. Small bowel fluid is noted throughout. Vascular/Lymphatic: Aortic atherosclerosis. No enlarged abdominal or pelvic lymph nodes. 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 No findings to suggest mesenteric ischemia are noted. Reproductive: Status post hysterectomy. No adnexal masses. Other: No significant free pelvic fluid is noted. Mild changes of anasarca are seen in the lateral abdominal wall. Musculoskeletal: Degenerative changes of lumbar spine are noted. IMPRESSION: Increase in large bilateral pleural effusions with lower lobe consolidation right considerably greater than left. Interval changes consistent with subtotal colectomy and right-sided end ileostomy. No obstructive changes are seen. Electronically Signed   By: Inez Catalina M.D.   On: 04/08/2022 20:03   DG Chest Port 1 View  Result Date: 04/08/2022 CLINICAL DATA:  Shortness of breath EXAM: PORTABLE CHEST 1 VIEW COMPARISON:  03/23/2022 FINDINGS: Cardiac shadow is stable. Aortic calcifications are again noted. Chronic pleural effusions are noted stable from the prior study. Skin folds are noted over the left chest. Some patchy airspace opacity is noted in the right lung base adjacent to the effusion. No acute bony abnormality is seen. IMPRESSION: Chronic bilateral effusions. Patchy right basilar airspace opacity. Electronically Signed   By: Inez Catalina M.D.   On: 04/08/2022 19:00    Scheduled Meds:  apixaban  2.5 mg Oral BID   leptospermum manuka honey  1 Application Topical Daily   memantine  5 mg Oral BID   vancomycin  125 mg Oral BID    Continuous Infusions:  azithromycin     ceFEPime (MAXIPIME) IV Stopped (04/09/22 1307)   DOPamine 10 mcg/kg/min (04/09/22 0604)   [START ON 04/10/2022] vancomycin       LOS: 0 days     Alma Friendly, MD [facility]  If 7PM-7AM, please contact night-coverage [email or web address] 04/09/2022, 3:10 PM

## 2022-04-09 NOTE — Progress Notes (Signed)
Pharmacy Antibiotic Note  Julie Montgomery is a 86 y.o. female admitted on 04/08/2022 with pneumonia.  Pharmacy has been consulted for vancomycin dosing.  Plan: Vancomycin 1000 mg IV Q36H. Goal AUC 400-550. Expected AUC 510. SCr used 0.8 (actual 0.48).   Temp (24hrs), Avg:97.7 F (36.5 C), Min:97.2 F (36.2 C), Max:98.4 F (36.9 C)  Recent Labs  Lab 04/08/22 1825 04/08/22 2215 04/09/22 0430  WBC 12.0*  --   --   CREATININE 0.48  --   --   LATICACIDVEN 1.5 1.1 1.2    Estimated Creatinine Clearance: 40 mL/min (by C-G formula based on SCr of 0.48 mg/dL).    Allergies  Allergen Reactions   Lactose Intolerance (Gi) Other (See Comments)    Intolerance    Lovastatin Other (See Comments)    Unknown reaction   Mometasone Furo-Formoterol Fum Other (See Comments)    Loss of appetite, laryngitis    Peanut-Containing Drug Products Other (See Comments)   Prozac [Fluoxetine Hcl] Other (See Comments)    Jumpy   Sulfa Antibiotics Other (See Comments)    Unknown reaction    Thank you for allowing pharmacy to be a part of this patient's care.  Wynona Neat, PharmD, BCPS  04/09/2022 5:57 AM

## 2022-04-10 ENCOUNTER — Inpatient Hospital Stay (HOSPITAL_COMMUNITY): Payer: Medicare Other

## 2022-04-10 ENCOUNTER — Telehealth: Payer: Self-pay

## 2022-04-10 DIAGNOSIS — R609 Edema, unspecified: Secondary | ICD-10-CM | POA: Diagnosis not present

## 2022-04-10 DIAGNOSIS — M7989 Other specified soft tissue disorders: Secondary | ICD-10-CM

## 2022-04-10 DIAGNOSIS — J9601 Acute respiratory failure with hypoxia: Secondary | ICD-10-CM | POA: Diagnosis not present

## 2022-04-10 DIAGNOSIS — Z789 Other specified health status: Secondary | ICD-10-CM

## 2022-04-10 DIAGNOSIS — I509 Heart failure, unspecified: Secondary | ICD-10-CM | POA: Diagnosis not present

## 2022-04-10 DIAGNOSIS — E8770 Fluid overload, unspecified: Secondary | ICD-10-CM

## 2022-04-10 DIAGNOSIS — R531 Weakness: Secondary | ICD-10-CM

## 2022-04-10 DIAGNOSIS — J189 Pneumonia, unspecified organism: Secondary | ICD-10-CM | POA: Diagnosis not present

## 2022-04-10 DIAGNOSIS — Z515 Encounter for palliative care: Secondary | ICD-10-CM

## 2022-04-10 DIAGNOSIS — Z711 Person with feared health complaint in whom no diagnosis is made: Secondary | ICD-10-CM

## 2022-04-10 DIAGNOSIS — A419 Sepsis, unspecified organism: Secondary | ICD-10-CM | POA: Diagnosis not present

## 2022-04-10 LAB — GLUCOSE, CAPILLARY
Glucose-Capillary: 84 mg/dL (ref 70–99)
Glucose-Capillary: 109 mg/dL — ABNORMAL HIGH (ref 70–99)
Glucose-Capillary: 92 mg/dL (ref 70–99)
Glucose-Capillary: 126 mg/dL — ABNORMAL HIGH (ref 70–99)
Glucose-Capillary: 103 mg/dL — ABNORMAL HIGH (ref 70–99)

## 2022-04-10 LAB — CBC WITH DIFFERENTIAL/PLATELET
Abs Immature Granulocytes: 0.06 10*3/uL (ref 0.00–0.07)
Basophils Absolute: 0.1 10*3/uL (ref 0.0–0.1)
Basophils Relative: 0 %
Eosinophils Absolute: 0 10*3/uL (ref 0.0–0.5)
Eosinophils Relative: 0 %
HCT: 25.1 % — ABNORMAL LOW (ref 36.0–46.0)
Hemoglobin: 8.3 g/dL — ABNORMAL LOW (ref 12.0–15.0)
Immature Granulocytes: 1 %
Lymphocytes Relative: 6 %
Lymphs Abs: 0.7 10*3/uL (ref 0.7–4.0)
MCH: 30.3 pg (ref 26.0–34.0)
MCHC: 33.1 g/dL (ref 30.0–36.0)
MCV: 91.6 fL (ref 80.0–100.0)
Monocytes Absolute: 0.9 10*3/uL (ref 0.1–1.0)
Monocytes Relative: 8 %
Neutro Abs: 9.5 10*3/uL — ABNORMAL HIGH (ref 1.7–7.7)
Neutrophils Relative %: 85 %
Platelets: 331 10*3/uL (ref 150–400)
RBC: 2.74 MIL/uL — ABNORMAL LOW (ref 3.87–5.11)
RDW: 17.4 % — ABNORMAL HIGH (ref 11.5–15.5)
WBC: 11.2 10*3/uL — ABNORMAL HIGH (ref 4.0–10.5)
nRBC: 0 % (ref 0.0–0.2)

## 2022-04-10 LAB — BASIC METABOLIC PANEL
Anion gap: 8 (ref 5–15)
BUN: 11 mg/dL (ref 8–23)
CO2: 27 mmol/L (ref 22–32)
Calcium: 8.8 mg/dL — ABNORMAL LOW (ref 8.9–10.3)
Chloride: 100 mmol/L (ref 98–111)
Creatinine, Ser: 0.42 mg/dL — ABNORMAL LOW (ref 0.44–1.00)
GFR, Estimated: >60 mL/min (ref >60)
Glucose, Bld: 89 mg/dL (ref 70–99)
Potassium: 3.7 mmol/L (ref 3.5–5.1)
Sodium: 135 mmol/L (ref 135–145)

## 2022-04-10 LAB — LEGIONELLA PNEUMOPHILA SEROGP 1 UR AG: L. pneumophila Serogp 1 Ur Ag: NEGATIVE

## 2022-04-10 LAB — PROCALCITONIN: Procalcitonin: <0.1 ng/mL

## 2022-04-10 MED ORDER — MIDODRINE HCL 5 MG PO TABS
10.0000 mg | ORAL_TABLET | Freq: Three times a day (TID) | ORAL | Status: DC
  Administered 2022-04-10 – 2022-04-15 (×12): 10 mg via ORAL
  Filled 2022-04-10 (×14): qty 2

## 2022-04-10 MED ORDER — FENTANYL CITRATE PF 50 MCG/ML IJ SOSY
12.5000 ug | PREFILLED_SYRINGE | Freq: Once | INTRAMUSCULAR | Status: AC
  Administered 2022-04-11: 12.5 ug via INTRAVENOUS
  Filled 2022-04-10: qty 1

## 2022-04-10 MED ORDER — ENSURE ENLIVE PO LIQD
237.0000 mL | Freq: Two times a day (BID) | ORAL | Status: DC
  Administered 2022-04-11 – 2022-04-15 (×8): 237 mL via ORAL

## 2022-04-10 MED ORDER — PIPERACILLIN-TAZOBACTAM 3.375 G IVPB
3.3750 g | Freq: Three times a day (TID) | INTRAVENOUS | Status: AC
  Administered 2022-04-10 – 2022-04-13 (×12): 3.375 g via INTRAVENOUS
  Filled 2022-04-10 (×12): qty 50

## 2022-04-10 MED ORDER — SODIUM CHLORIDE 0.9 % IV SOLN: INTRAVENOUS | Status: DC | PRN

## 2022-04-10 NOTE — Progress Notes (Signed)
Pharmacy Antibiotic Note  Julie Montgomery is a 86 y.o. female admitted on 04/08/2022 with  aspiration PNA .  Pharmacy has been consulted for zosyn dosing. WBC 11.2/afeb, PCT neg  Plan: Zosyn 3.375 g IV q8h  F/u LOT, renal function, culture data  Weight: 46.8 kg (103 lb 2.8 oz)  Temp (24hrs), Avg:97.8 F (36.6 C), Min:97.6 F (36.4 C), Max:98.1 F (36.7 C)  Recent Labs  Lab 04/08/22 1825 04/08/22 2215 04/09/22 0430 04/09/22 2000 04/09/22 2023 04/10/22 0342  WBC 12.0*  --   --   --  14.2* 11.2*  CREATININE 0.48  --   --   --  0.50 0.42*  LATICACIDVEN 1.5 1.1 1.2 0.9  --   --     Estimated Creatinine Clearance: 38.7 mL/min (A) (by C-G formula based on SCr of 0.42 mg/dL (L)).    Allergies  Allergen Reactions   Lactose Intolerance (Gi) Other (See Comments)    Intolerance    Lovastatin Other (See Comments)    Unknown reaction   Mometasone Furo-Formoterol Fum Other (See Comments)    Loss of appetite, laryngitis    Peanut-Containing Drug Products Other (See Comments)   Prozac [Fluoxetine Hcl] Other (See Comments)    Jumpy   Sulfa Antibiotics Other (See Comments)    Unknown reaction    Antimicrobials this admission: Vanc 12/11>>12/12 Cefepime 12/11>12/12 Zosyn 12/12> Vanc PO>  Dose adjustments this admission:  Microbiology results: 12/11 BCx:  12/10 UCx:  12/11 resp PCR: neg 12/11 MRSA neg 12/11 Cdiff neg  12/11 Strep pneumo neg   Thank you for allowing pharmacy to be a part of this patient's care.  Wilson Singer, PharmD Clinical Pharmacist 04/10/2022 10:40 AM

## 2022-04-10 NOTE — Progress Notes (Signed)
[facility] Progress Note Patient Name: Julie Montgomery DOB: 02-Jun-1935 MRN: [id number]   Date of Service  04/10/2022  HPI/Events of Note  Patient is crying out with back and neck pain.   eICU Interventions  Plan: Fentanyl 12.5 mcg IV X 1.      Intervention Category Major Interventions: Other:  Lysle Dingwall 04/10/2022, 10:53 PM

## 2022-04-10 NOTE — Progress Notes (Signed)
Heart Failure Navigator Progress Note  Assessed for Heart & Vascular TOC clinic readiness.  Patient does not meet criteria due to [facility] consult.   Navigator will sign off at this time.   Tahjanae Blankenburg, BSN, RN Heart Failure Nurse Navigator Secure Chat Only   

## 2022-04-10 NOTE — Progress Notes (Signed)
NAME:  Julie Montgomery, MRN:  [id number], DOB:  [birth year], LOS: 1 ADMISSION DATE:  04/08/2022, CONSULTATION DATE:  04/08/2022 REFERRING MD:  Armandina Gemma - EDP, CHIEF COMPLAINT:  SOB, body aches, nausea   History of Present Illness:  86 year old woman who presented to [facility] ED 12/10 with SOB, body aches, nausea and lethargy with FTT. 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 She has been hospitalized 5 times in 2023. Most recent, patient was discharged to [facility] 12/1.  On ED arrival, patient was pleasantly demented/slightly disoriented and c/o back pain. Bradycardic on tele with HR 40s-50s. BP 90s/70s. SpO2 80% on RA, improved to 100% on 2LNC. Labs were notable for WBC 12.0, BNP 904, K 5.7. Lasix '40mg'$  IV administered by EDP. Cardiology consulted with plan for transfer to [facility] for further care.  CTA PE protocol obtained 12/10 prior to transfer was negative for PE; demonstrated moderate pleural effusions and compressive atelectasis and nondependent RLL consolidation c/f PNA, GGOs of the RUL, RML and LUL (infectious vs inflammatory), anasarca.  Transferred from [facility] to [facility]. PCCM consulted; initial plan for admission to [facility] but patient required dopamine gtt initiation for   Pertinent Medical History:  Dementia, recurrent C. difficile infection, permanent A-fib on Eliquis, chronic hyponatremia, failure to thrive, stroke, H. pylori, abdominal perforation with subtotal colectomy and end ileostomy (03/21/2022)  Significant Hospital Events: Including procedures, antibiotic start and stop dates in addition to other pertinent events   12/10 Presented to [facility] ED with SOB, malaise/fatigue, lethargy and FTT. CTA Chest negative for PE but +bilateral moderate pleural effusions and GGOs of RUL/RML/LUL. Transferred to [facility] for  further evaluation. 12/11 PCCM reconsulted as patient placed on dopamine  Interim History / Subjective:  Patient remained 5 mics of dopamine to maintain map goal 65 Complaining of tailbone pain Remained afebrile, no overnight complaints  Objective:  Blood pressure (!) 117/58, pulse 74, temperature 97.6 F (36.4 C), temperature source Oral, resp. rate (!) 22, weight 46.8 kg, SpO2 99 %.        Intake/Output Summary (Last 24 hours) at 04/10/2022 0825 Last data filed at 04/10/2022 0800 Gross per 24 hour  Intake 335.25 ml  Output 2610 ml  Net -2274.75 ml   Filed Weights   04/09/22 1813  Weight: 46.8 kg    Physical exam: General: Chronically ill-appearing female, sitting on the bed HEENT: [place]/AT, eyes anicteric.  moist mucus membranes Neuro: Alert, awake following commands Chest: Reduced air entry at the bases bilaterally, no wheezes or rhonchi Heart: Irregularly irregular, no murmurs or gallops Abdomen: Soft, nontender, nondistended, bowel sounds present Skin: No rash   Resolved Hospital Problem List:    Assessment & Plan:  Septic shock due to pneumonia could be aspiration X-ray shows bilateral multifocal infiltrates Patient continues require low-dose vasopressor support, will try to taper it off with map goal 65 Switch antibiotic to IV Zosyn, considering high risk of aspiration Follow-up cultures  Acute respiratory failure with hypoxia due to pneumonia and pleural effusion S/p thoracentesis by IR yesterday, 600 cc of fluid removed . Unfortunately fluid was not sent for analysis Continue supplemental O2 goal 92% Encourage incentive spirometry  Chronic atrial fibrillation Patient was bradycardic upon arrival Now heart rate is better in 70s Continue amiodarone Hold diltiazem considering hypotension Continue Eliquis for stroke prophylaxis  Hyperkalemia, resolved Chronic hyponatremia, improved Closely monitor lites  Right-sided end ileostomy secondary  to acute  intestinal perforation secondary to C. difficile toxic megacolon on 11/22 Continue wound care Continue vanc '125mg'$  BID PO for C. difficile prophylaxis  Failure to thrive Five hospital admissions this year with multiple significant insults. Palliative care is following Continue goals of care discussion  Best Practice: (right click and "Reselect all SmartList Selections" daily)   Diet/type: Regular consistency (see orders) DVT prophylaxis: SCDs, Eliquis GI prophylaxis: N/A Central venous access:  N/A Foley:  N/A Code Status:  full code Last date of multidisciplinary goals of care discussion [12/11 - ED bedside discussion with patient, husband, son, CCM. Patient states that if she were to become more critically ill, she wants to "live as long as God gives me breath" and wants to "fight and live".]  Critical care time:    This patient is critically ill with multiple organ system failure which requires frequent high complexity decision making, assessment, support, evaluation, and titration of therapies. This was completed through the application of advanced monitoring technologies and extensive interpretation of multiple databases.  During this encounter critical care time was devoted to patient care services described in this note for 38 minutes.    Jacky Kindle, MD [facility] See Amion for pager If no response to pager, please call [phone] until 7pm After 7pm, Please call E-link [phone]

## 2022-04-10 NOTE — Progress Notes (Addendum)
PT Cancellation Note  Patient Details Name: Julie Montgomery MRN: [id number] DOB: [birth year]   Cancelled Treatment:    Reason Eval/Treat Not Completed: Other (comment) Attempted x 2 and pt declined due to wanting to rest/too tired.  Tried to encourage/educate on importance of mobility, but she request tomorrow.   Addendum: heard family in room so tried again around 1430 but pt still adamently refused despite encouragement from family. Did get history in chart.   Will f/u as able.  Abran Richard, PT Acute Rehab [facility] [phone]   Julie Montgomery 04/10/2022, 1:51 PM

## 2022-04-10 NOTE — Progress Notes (Signed)
Daily Progress Note   Patient Name: Julie Montgomery       Date: 04/10/2022 DOB: Feb 01, 1936  Age: 86 y.o. MRN#: [id number] Attending Physician: Jacky Kindle, MD Primary Care Physician: Cassandria Anger, MD Admit Date: 04/08/2022  Reason for Consultation/Follow-up: Establishing goals of care  Subjective: I have reviewed medical records including EPIC notes and labs. Received report from primary RN - no acute concerns.  Went to visit patient at bedside - son/[name] present. PT was present but patient declined participating due to being tired, stating "I don't want to get up." Patient is lying in bed awake, alert, oriented x3; however, is clear she is not able to understand complex medical discussions or make complex medical decisions throughout visit. No signs or non-verbal gestures of pain or discomfort noted. No respiratory distress, increased work of breathing, or secretions noted.   Re-introduced PMT per patient's request.  Emotional support provided to patient and family. Therapeutic listening provided as she tells me she isn't able to get sleep due to "so many people waking her up."   A very lengthy and detailed code status discussion was had. Husband/[name] arrived about half way through this conversation. Encouraged patient/family to consider DNR/DNI status understanding evidenced based poor outcomes in similar hospitalized patient, as the cause of arrest is likely associated with advanced chronic/terminal illness rather than an easily reversible acute cardio-pulmonary event.  I shared that even if we pursued resuscitation we would not able to resolve the underlying factors. I explained that DNR/DNI does not change the medical plan and it only comes into effect after a person has arrested  (died).  It is a protective measure to keep [place] from harming the patient in their last moments of life. Patient is not able to grasp code status concepts. She has DNR/DNI philosophy; however, continually states to keep her alive. She states things like "I want to die when God takes me," "Keep me alive. If God wants to take me he will, but keep me alive,"If He is going to take you He will, but man should keep you alive," "I want to die peacefully, die naturally." I attempted to gain insight into what "alive" means to her - she states "knowing what is going on." I attempted to have discussion around this value, but she states "you don't know when I'm  going to die" and "you don't know what it's like to die." Her thoughts are cyclical and she does not comprehend explanations. I attempted to discuss this concern with her son, telling him that patient is not able to understand the complex information/decisions we are asking of her; however, he states "she does understand, she is telling you what she wants." [name] answers are contradictory - she wants to "die peacefully when God says it's time" without intervention, but also states she wants everything done to "keep her alive." Patient feels as if I am going to stop all of her treatment and "just let her die." Attempted to re-orient and validated that DNR/DNI does not change the medical plan and it only comes into effect after a person has arrested (died) - she does not understand. The conversation was left that she would remain full code with understanding that she would receive CPR, defibrillation, ACLS medications, and/or intubation.   Counseled that if the goal was to prolong life, working with PT/OT would be important. Briefly discussed the importance of adequate nutrition and physical activity. She is not certain she wants to work with PT tomorrow either. Reviewed with patient and her family that she remains high risk for decline.  Husband would like to  discuss next steps/disposition after PT/OT evaluation. Meeting scheduled for tomorrow 12/13 - family to call PMT phone with requested time.  All questions and concerns addressed. Encouraged to call with questions and/or concerns. PMT card provided.  Length of Stay: 1  Current Medications: Scheduled Meds:   amiodarone  200 mg Oral Daily   apixaban  2.5 mg Oral BID   bumetanide  0.5 mg Oral BID   Chlorhexidine Gluconate Cloth  6 each Topical Daily   leptospermum manuka honey  1 Application Topical Daily   memantine  5 mg Oral BID   midodrine  10 mg Oral TID WC   vancomycin  125 mg Oral BID    Continuous Infusions:  piperacillin-tazobactam (ZOSYN)  IV 3.375 g (04/10/22 1056)    PRN Meds: oxyCODONE  Physical Exam Vitals and nursing note reviewed.  Constitutional:      General: She is not in acute distress. Pulmonary:     Effort: No respiratory distress.  Skin:    General: Skin is warm and dry.  Neurological:     Mental Status: She is alert and oriented to person, place, and time.     Motor: Weakness present.  Psychiatric:        Mood and Affect: Affect is angry.        Behavior: Behavior is cooperative.        Cognition and Memory: Memory normal. Cognition is impaired.             Vital Signs: BP (!) 124/53   Pulse 60   Temp 97.8 F (36.6 C) (Oral)   Resp 14   Wt 46.8 kg   SpO2 100%   BMI 18.87 kg/m  SpO2: SpO2: 100 % O2 Device: O2 Device: Nasal Cannula O2 Flow Rate: O2 Flow Rate (L/min): 3 L/min  Intake/output summary:  Intake/Output Summary (Last 24 hours) at 04/10/2022 1425 Last data filed at 04/10/2022 0800 Gross per 24 hour  Intake 835.25 ml  Output 2610 ml  Net -1774.75 ml   LBM: Last BM Date : 04/09/22 Baseline Weight: Weight: 46.8 kg Most recent weight: Weight: 46.8 kg       Palliative Assessment/Data: PPS 30%      Patient Active Problem List  Diagnosis Date Noted   HCAP (healthcare-associated pneumonia) 04/09/2022   Septic shock  ([place]) 04/09/2022   Elevated brain natriuretic peptide (BNP) level 04/09/2022   Bilateral pleural effusion 04/09/2022   Status post thoracentesis 04/09/2022   Peritonitis ([place]) 03/22/2022   Abnormal x-ray of abdomen 03/08/2022   Adynamic ileus ([place]) 03/08/2022   Protein-calorie malnutrition, severe 03/07/2022   Hypercalcemia 03/06/2022   Anemia of chronic disease 03/06/2022   Ileus ([place]) 03/06/2022   Abdominal pain 03/05/2022   Atrial fibrillation with RVR ([place]) 02/28/2022   Long term current use of antiarrhythmic drug 02/28/2022   History of TIA (transient ischemic attack) 02/28/2022   Atherosclerosis of aorta ([place]) 02/28/2022   Recurrent colitis due to Clostridium difficile 02/22/2022   Infectious colitis    C. difficile diarrhea 02/07/2022   Dyspnea 01/26/2022   Hyponatremia 28/31/5176   Metabolic acidosis 16/10/3708   C. difficile colitis 01/11/2022   Paroxysmal atrial fibrillation ([place]) 12/19/2021   Recurrent Clostridioides difficile infection 12/19/2021   Paroxysmal atrial fibrillation with RVR ([place]) 12/14/2021   Protein calorie malnutrition ([place]) 12/13/2021   Dehydration with hyponatremia 12/12/2021   Wound infection following procedure 11/29/2021   Breast lump 08/15/2021   History of hormone replacement therapy 08/15/2021   Hyperlipidemia 08/15/2021   Osteopenia 08/15/2021   Sensorineural hearing loss (SNHL) of both ears 08/15/2021   Underweight 03/31/2021   Statin myopathy 03/09/2021   Achilles tendon disorder, right 02/14/2021   Memory problem 01/19/2020   Ganglion cyst of left foot 01/11/2020   Mass of foot 12/14/2019   Dehydration 10/29/2019   Gastroenteritis 03/23/2019   IBS (irritable bowel syndrome) 01/19/2019   Osteoporosis 01/13/2019   Coronary atherosclerosis due to calcified coronary lesion 06/11/2018   Sternoclavicular joint pain, right 06/12/2017   Left ulnar fracture 09/06/2016   Post-nasal drip 05/02/2016   Carotid bruit 02/17/2016   Stress at  home 10/18/2015   Swelling of foot joint 10/14/2014   Clostridium difficile colitis 10/04/2014   Diarrhea 10/04/2014   Left hand weakness 08/10/2014   Numbness of left hand 04/19/2014   Lipoma of arm 01/23/2012   Goals of care, counseling/discussion 04/03/2011   Edema 02/13/2011   Shoulder pain, right 01/30/2011   Bronchiectasis ([place]) 01/23/2011   Vertigo 09/13/2010   Allergic rhinitis 09/13/2010   CONSTIPATION 03/22/2010   LUMBAGO 03/22/2010   URINARY FREQUENCY, CHRONIC 03/22/2010   Situational mixed anxiety and depressive disorder 02/25/2008   Cough 02/17/2008   Anxiety disorder 02/07/2007   INSOMNIA-SLEEP DISORDER-UNSPEC 02/07/2007   Weight loss 02/07/2007   Dyslipidemia 11/21/2006   OSTEOPENIA 11/21/2006   ALLERGY 11/21/2006    Palliative Care Assessment & Plan   Patient Profile: 86 y.o. female  with past medical history of dementia, recurrent C. difficile infection, permanent A-fib on Eliquis, chronic hyponatremia,  failure to thrive, stroke, H. pylori, abdominal perforation with subtotal colectomy and end ileostomy (03/21/2022) during recent prolonged hospitalization 03/05/22-03/30/22  admitted on 04/08/2022 with shortness of breath and bodyaches.    Patient was initially seen at [facility] ED and transferred to [facility] upon recommendation of critical care.  She is now admitted with healthcare acquired pneumonia, sepsis, acute hypoxic respiratory failure, acute diastolic heart failure exacerbation. PMT has been consulted to assist with goals of care conversation.  Patient is familiar to our service from her last hospitalization.  Assessment: Principal Problem:   HCAP (healthcare-associated pneumonia) Active Problems:   Coronary atherosclerosis due to calcified coronary lesion   Long term current use of antiarrhythmic drug   Atherosclerosis  of aorta (HCC)   Septic shock (HCC)   Elevated brain natriuretic peptide (BNP) level   Bilateral pleural effusion   Status post  thoracentesis   Concern about end of life  Recommendations/Plan: Continue full code/full scope Patient is oriented x3; however, is not able to grasp complex medical information/decision making. Family want patient to make her own decisions Since goal is to prolong her life - encouraged PT/OT participation Family meeting scheduled for tomorrow 12/13 - family to call PMT with time Ongoing GOC pending clinical course PMT will continue to follow and support holistically  Goals of Care and Additional Recommendations: Limitations on Scope of Treatment: Full Scope Treatment  Code Status:    Code Status Orders  (From admission, onward)           Start     Ordered   04/09/22 0400  Full code  Continuous        04/09/22 0401           Code Status History     Date Active Date Inactive Code Status Order ID Comments User Context   03/05/2022 2227 03/30/2022 1901 Full Code [id number]  Rhetta Mura, DO ED   02/22/2022 1540 02/28/2022 2354 Full Code [id number]  Karmen Bongo, MD ED   02/07/2022 0324 02/12/2022 1638 Full Code [id number]  Kayleen Memos, DO ED   01/11/2022 1704 01/13/2022 1943 Full Code [id number]  Lequita Halt, MD ED   12/13/2021 0638 12/18/2021 1908 Full Code [id number]  Shalhoub, Sherryll Burger, MD Inpatient       Prognosis:  Poor long-term prognosis given recurrent acute illnesses/hospitalizations, chronic debility, and failure to thrive   Discharge Planning: To Be Determined  Care plan was discussed with primary RN, patient's family, patient  Thank you for allowing the Palliative Medicine Team to assist in the care of this patient.  Lin Landsman, NP  Please contact Palliative Medicine Team phone at [phone] for questions and concerns.   *Portions of this note are a verbal dictation therefore any spelling and/or grammatical errors are due to the "[place] [employer]" system interpretation.

## 2022-04-10 NOTE — Progress Notes (Signed)
Lower extremity venous duplex has been completed.   Preliminary results in CV Proc.   Julie Montgomery 04/10/2022 12:05 PM

## 2022-04-10 NOTE — Progress Notes (Signed)
PT arrived from the ED prior to shift change on 12/11. PT began looking for her glasses this morning at 0500. PT looked through her belonging bag and did not find them. ED was contacted and no glasses were found. ED will contact if found.

## 2022-04-10 NOTE — Progress Notes (Signed)
At bedside for PICC placement. Spoke with Donald,RN who states this pt may not need a PICC line. Pt currently off dopamine, however, if pt begins to deteriorate, dopamine will be restarted and will necessitate PICC insertion. RN to follow-up.

## 2022-04-10 NOTE — Telephone Encounter (Signed)
-----   Message from Jerene Bears, MD sent at 04/10/2022  9:30 AM EST ----- Regarding: RE: VOWST Not at this time. Thanks for closing the loop JMP  ----- Message ----- From: Carl Best, RN Sent: 04/09/2022  11:40 AM EST To: Jerene Bears, MD Subject: Judd Gaudier Dr. Hilarie Fredrickson, this is the patient that I submitted VOWST paperwork for a few weeks ago. I haven't received a fax back, but I do see where her son messaged to Infectious Disease that it was approved by insurance. From Dr. Corena Pilgrim hospital note on 03/21/22, he mentioned that GI was signing off since Infectious Disease planned to managed Cdiff. Do I need to do any further follow up in regards to the VOWST?   Thanks!  Donita Brooks

## 2022-04-11 ENCOUNTER — Inpatient Hospital Stay (HOSPITAL_COMMUNITY): Payer: Medicare Other

## 2022-04-11 ENCOUNTER — Inpatient Hospital Stay: Payer: Medicare Other | Admitting: Internal Medicine

## 2022-04-11 DIAGNOSIS — I959 Hypotension, unspecified: Secondary | ICD-10-CM | POA: Diagnosis not present

## 2022-04-11 DIAGNOSIS — Z792 Long term (current) use of antibiotics: Secondary | ICD-10-CM

## 2022-04-11 DIAGNOSIS — E43 Unspecified severe protein-calorie malnutrition: Secondary | ICD-10-CM

## 2022-04-11 DIAGNOSIS — R5381 Other malaise: Secondary | ICD-10-CM | POA: Diagnosis not present

## 2022-04-11 DIAGNOSIS — J189 Pneumonia, unspecified organism: Secondary | ICD-10-CM | POA: Diagnosis not present

## 2022-04-11 DIAGNOSIS — R609 Edema, unspecified: Secondary | ICD-10-CM | POA: Diagnosis not present

## 2022-04-11 DIAGNOSIS — I509 Heart failure, unspecified: Secondary | ICD-10-CM | POA: Diagnosis not present

## 2022-04-11 DIAGNOSIS — J90 Pleural effusion, not elsewhere classified: Secondary | ICD-10-CM | POA: Diagnosis not present

## 2022-04-11 DIAGNOSIS — J9601 Acute respiratory failure with hypoxia: Secondary | ICD-10-CM | POA: Diagnosis not present

## 2022-04-11 LAB — GLUCOSE, CAPILLARY
Glucose-Capillary: 102 mg/dL — ABNORMAL HIGH (ref 70–99)
Glucose-Capillary: 108 mg/dL — ABNORMAL HIGH (ref 70–99)
Glucose-Capillary: 147 mg/dL — ABNORMAL HIGH (ref 70–99)
Glucose-Capillary: 90 mg/dL (ref 70–99)

## 2022-04-11 LAB — URINE CULTURE: Culture: 80000 — AB

## 2022-04-11 LAB — CBC WITH DIFFERENTIAL/PLATELET
Abs Immature Granulocytes: 0.06 10*3/uL (ref 0.00–0.07)
Basophils Absolute: 0 10*3/uL (ref 0.0–0.1)
Basophils Relative: 0 %
Eosinophils Absolute: 0.1 10*3/uL (ref 0.0–0.5)
Eosinophils Relative: 1 %
HCT: 24.5 % — ABNORMAL LOW (ref 36.0–46.0)
Hemoglobin: 8.3 g/dL — ABNORMAL LOW (ref 12.0–15.0)
Immature Granulocytes: 1 %
Lymphocytes Relative: 5 %
Lymphs Abs: 0.6 10*3/uL — ABNORMAL LOW (ref 0.7–4.0)
MCH: 30.3 pg (ref 26.0–34.0)
MCHC: 33.9 g/dL (ref 30.0–36.0)
MCV: 89.4 fL (ref 80.0–100.0)
Monocytes Absolute: 1.2 10*3/uL — ABNORMAL HIGH (ref 0.1–1.0)
Monocytes Relative: 9 %
Neutro Abs: 10.8 10*3/uL — ABNORMAL HIGH (ref 1.7–7.7)
Neutrophils Relative %: 84 %
Platelets: 328 10*3/uL (ref 150–400)
RBC: 2.74 MIL/uL — ABNORMAL LOW (ref 3.87–5.11)
RDW: 16.9 % — ABNORMAL HIGH (ref 11.5–15.5)
WBC: 12.8 10*3/uL — ABNORMAL HIGH (ref 4.0–10.5)
nRBC: 0 % (ref 0.0–0.2)

## 2022-04-11 LAB — BODY FLUID CELL COUNT WITH DIFFERENTIAL
Lymphs, Fluid: 31 %
Monocyte-Macrophage-Serous Fluid: 19 % — ABNORMAL LOW (ref 50–90)
Neutrophil Count, Fluid: 50 % — ABNORMAL HIGH (ref 0–25)
Total Nucleated Cell Count, Fluid: 535 cu mm (ref 0–1000)

## 2022-04-11 LAB — LACTATE DEHYDROGENASE, PLEURAL OR PERITONEAL FLUID: LD, Fluid: 73 U/L — ABNORMAL HIGH (ref 3–23)

## 2022-04-11 LAB — BASIC METABOLIC PANEL
Anion gap: 8 (ref 5–15)
BUN: 14 mg/dL (ref 8–23)
CO2: 27 mmol/L (ref 22–32)
Calcium: 8.9 mg/dL (ref 8.9–10.3)
Chloride: 95 mmol/L — ABNORMAL LOW (ref 98–111)
Creatinine, Ser: 0.49 mg/dL (ref 0.44–1.00)
GFR, Estimated: >60 mL/min (ref >60)
Glucose, Bld: 106 mg/dL — ABNORMAL HIGH (ref 70–99)
Potassium: 3.4 mmol/L — ABNORMAL LOW (ref 3.5–5.1)
Sodium: 130 mmol/L — ABNORMAL LOW (ref 135–145)

## 2022-04-11 LAB — PROTEIN, PLEURAL OR PERITONEAL FLUID: Total protein, fluid: <3 g/dL

## 2022-04-11 LAB — GLUCOSE, PLEURAL OR PERITONEAL FLUID: Glucose, Fluid: 126 mg/dL

## 2022-04-11 MED ORDER — HALOPERIDOL LACTATE 5 MG/ML IJ SOLN
2.0000 mg | Freq: Once | INTRAMUSCULAR | Status: AC
  Administered 2022-04-11: 2 mg via INTRAVENOUS
  Filled 2022-04-11: qty 1

## 2022-04-11 MED ORDER — HEPARIN (PORCINE) 25000 UT/250ML-% IV SOLN
1300.0000 [IU]/h | INTRAVENOUS | Status: DC
  Administered 2022-04-11: 650 [IU]/h via INTRAVENOUS
  Administered 2022-04-14: 1200 [IU]/h via INTRAVENOUS
  Filled 2022-04-11 (×4): qty 250

## 2022-04-11 MED ORDER — POTASSIUM CHLORIDE CRYS ER 20 MEQ PO TBCR
40.0000 meq | EXTENDED_RELEASE_TABLET | Freq: Once | ORAL | Status: AC
  Administered 2022-04-11: 40 meq via ORAL
  Filled 2022-04-11: qty 2

## 2022-04-11 MED ORDER — SODIUM CHLORIDE 0.9% FLUSH
10.0000 mL | Freq: Three times a day (TID) | INTRAVENOUS | Status: DC
  Administered 2022-04-11 – 2022-04-14 (×8): 10 mL via INTRAPLEURAL

## 2022-04-11 NOTE — Progress Notes (Signed)
NAME:  Julie Montgomery, MRN:  [id number], DOB:  02/26/1936, LOS: 1 ADMISSION DATE:  04/08/2022, CONSULTATION DATE:  04/08/2022 REFERRING MD:  Armandina Gemma - EDP, CHIEF COMPLAINT:  SOB, body aches, nausea   History of Present Illness:  86 year old woman who presented to [place] ED 12/10 with SOB, body aches, nausea and lethargy with FTT. 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 She has been hospitalized 5 times in 2023. Most recent, patient was discharged to [facility] 12/1.  On ED arrival, patient was pleasantly demented/slightly disoriented and c/o back pain. Bradycardic on tele with HR 40s-50s. BP 90s/70s. SpO2 80% on RA, improved to 100% on 2LNC. Labs were notable for WBC 12.0, BNP 904, K 5.7. Lasix '40mg'$  IV administered by EDP. Cardiology consulted with plan for transfer to [facility] for further care.  CTA PE protocol obtained 12/10 prior to transfer was negative for PE; demonstrated moderate pleural effusions and compressive atelectasis and nondependent RLL consolidation c/f PNA, GGOs of the RUL, RML and LUL (infectious vs inflammatory), anasarca.  Transferred from [facility] to [facility]. PCCM consulted; initial plan for admission to [facility] but patient required dopamine gtt initiation for   Pertinent Medical History:  Dementia, recurrent C. difficile infection, permanent A-fib on Eliquis, chronic hyponatremia, failure to thrive, stroke, H. pylori, abdominal perforation with subtotal colectomy and end ileostomy (03/21/2022)  Significant Hospital Events: Including procedures, antibiotic start and stop dates in addition to other pertinent events   12/10 Presented to [facility] ED with SOB, malaise/fatigue, lethargy and FTT. CTA Chest negative for PE but +bilateral moderate pleural effusions and GGOs of RUL/RML/LUL. Transferred to [facility] for  further evaluation. 12/11 PCCM reconsulted as patient placed on dopamine. On-going [place] discussion. Still full code   Interim History / Subjective:  Off pressors.   Objective:  Blood pressure (!) 117/58, pulse 74, temperature 97.6 F (36.4 C), temperature source Oral, resp. rate (!) 22, weight 46.8 kg, SpO2 99 %.        Intake/Output Summary (Last 24 hours) at 04/10/2022 0825 Last data filed at 04/10/2022 0800 Gross per 24 hour  Intake 335.25 ml  Output 2610 ml  Net -2274.75 ml   Filed Weights   04/09/22 1813  Weight: 46.8 kg    Physical exam:  General resting in bed. No distress but does c/o sacral discomfort.  HENT NCAT no JVD MMM Pulm scattered rhonchi w/ basilar rales.  Card rrr Abd soft Ext warm + LE edema  Neuro awake, confused at times. Recall is poor  Resolved Hospital Problem List:  Hyperkalemia   Assessment & Plan:  Septic shock due to pneumonia (possible aspiration) +/- Enterococcus Faecalis UTI (80K colony count) Afebrile. No sig change in wbc ct Plan Day 3 zosyn, stop at day 5 Avoid hypovolemia Hold antihypertensives Cont midodrine SBP goal > 90  Acute respiratory failure with hypoxia due to pneumonia and pleural effusion S/p thora 12/11. No fluid sent for analysis. 600 ml removed. Described as straw colored.  Plan Cont supplemental oxygen  Abx as above Spirometry as able  Pulse ox   Chronic atrial fibrillation Patient was bradycardic upon arrival Remains bradycardic but currently NSR Plan Cont amiodarone Cont DOAC    Fluid and electrolyte imbalance: Hypokalemia, Chronic hyponatremia (stable) Plan Replace K Intermittent labs  Right-sided end ileostomy secondary to acute intestinal perforation secondary to C. difficile toxic megacolon on 11/22 Plan Cont wound  care Vanc orally for cdiff prophylaxis    Failure to thrive Five hospital admissions this year with multiple significant insults. Palliative care is following. Last  discussion 12/12 w/ palliative. Still desires full code although at same time wanting to die peacefully w/out intervention.    Best Practice: (right click and "Reselect all SmartList Selections" daily)   Diet/type: Regular consistency (see orders) DVT prophylaxis: SCDs, Eliquis GI prophylaxis: N/A Central venous access:  N/A Foley:  N/A Code Status:  full code Last date of multidisciplinary goals of care discussion [12/11 - ED bedside discussion with patient, husband, son, CCM. Patient states that if she were to become more critically ill, she wants to "live as long as God gives me breath" and wants to "fight and live".]  Critical care time: NA   Erick Colace ACNP-BC [place] Pager # [phone] OR # [phone] if no answer

## 2022-04-11 NOTE — TOC Initial Note (Addendum)
Transition of Care [facility]) - Initial/Assessment Note    Patient Details  Name: Julie Montgomery MRN: [id number] Date of Birth: [birth year]  Transition [facility] CM/SW Contact:    Tom-Johnson, Renea Ee, RN Phone Number: 04/11/2022, 4:07 PM  Clinical Narrative:                  CM spoke with patient and husband at bedside and son, [name] via phone about discharge needs. Patient is admitted for HCAP, on IV abx, O2 @ 3L. Chest tube placement today. Has Hx of Dementia, recurrent C. difficile infection, A-fib on Eliquis, Failure to Thrive, Stroke, H. pylori, Abdominal Perforation with Subtotal Colectomy and End Ileostomy.    Son, [name] expressed desire of patient going home at discharge. [name] husband is hard of hearing and CM was permitted to speak with [name]. Patient is active with home health services from [place]. [name] is requesting patient discharge home with Hospice services and PCS. CM explained to [name] that they will have to pay out of pocket for PCS as Medicare does not pay for such services. CM notified [name] to look up PCS and apply. [name] will get back with CM after meeting with his brother about final decision. Home health is recommended by PT/OT at this time. CM will continue to follow as patient progresses towards discharge.      Expected Discharge Plan: [place] (Family requesting home with Hospice) Barriers to Discharge: Continued Medical Work up   Patient Goals and CMS Choice Patient states their goals for this hospitalization and ongoing recovery are:: To return home CMS Medicare.gov Compare Post Acute Care list provided to:: Patient Choice offered to / list presented to : Patient, Spouse, Adult Children (Son, Psychologist, clinical)  Expected Discharge Plan and Services Expected Discharge Plan: [place] (Family requesting home with Hospice)   Discharge Planning Services: CM Consult   Living arrangements for the past 2 months: Single Family  Home                                      Prior Living Arrangements/Services Living arrangements for the past 2 months: Single Family Home Lives with:: Spouse Patient language and need for interpreter reviewed:: Yes        Need for Family Participation in Patient Care: Yes (Comment) Care giver support system in place?: Yes (comment)   Criminal Activity/Legal Involvement Pertinent to Current Situation/Hospitalization: No - Comment as needed  Activities of Daily Living      Permission Sought/Granted Permission sought to share information with : Case Manager, Customer service manager, Family Supports Permission granted to share information with : Yes, Verbal Permission Granted              Emotional Assessment Appearance:: Appears stated age Attitude/Demeanor/Rapport: Engaged, Gracious Affect (typically observed): Accepting, Appropriate, Calm Orientation: : Oriented to Self, Oriented to Place, Oriented to Situation Alcohol / Substance Use: Not Applicable Psych Involvement: No (comment)  Admission diagnosis:  Pleural effusion [J90] Acute respiratory failure with hypoxia (HCC) [J96.01] HCAP (healthcare-associated pneumonia) [J18.9] Sepsis ([place]) [A41.9] Hypervolemia, unspecified hypervolemia type [E87.70] Edema, unspecified type [R60.9] Acute on chronic congestive heart failure, unspecified heart failure type ([place]) [I50.9] Patient Active Problem List   Diagnosis Date Noted   HCAP (healthcare-associated pneumonia) 04/09/2022   Septic shock ([place]) 04/09/2022   Elevated brain natriuretic peptide (BNP) level 04/09/2022   Pleural effusion  04/09/2022   Status post thoracentesis 04/09/2022   Peritonitis ([place]) 03/22/2022   Abnormal x-ray of abdomen 03/08/2022   Adynamic ileus ([place]) 03/08/2022   Protein-calorie malnutrition, severe 03/07/2022   Hypercalcemia 03/06/2022   Anemia of chronic disease 03/06/2022   Ileus ([place]) 03/06/2022   Abdominal pain 03/05/2022    Atrial fibrillation with RVR ([place]) 02/28/2022   Long term current use of antiarrhythmic drug 02/28/2022   History of TIA (transient ischemic attack) 02/28/2022   Atherosclerosis of aorta ([place]) 02/28/2022   Recurrent colitis due to Clostridium difficile 02/22/2022   Infectious colitis    C. difficile diarrhea 02/07/2022   Dyspnea 01/26/2022   Hyponatremia 29/47/6546   Metabolic acidosis 50/35/4656   C. difficile colitis 01/11/2022   Paroxysmal atrial fibrillation ([place]) 12/19/2021   Recurrent Clostridioides difficile infection 12/19/2021   Paroxysmal atrial fibrillation with RVR (HCC) 12/14/2021   Protein calorie malnutrition ([place]) 12/13/2021   Dehydration with hyponatremia 12/12/2021   Wound infection following procedure 11/29/2021   Breast lump 08/15/2021   History of hormone replacement therapy 08/15/2021   Hyperlipidemia 08/15/2021   Osteopenia 08/15/2021   Sensorineural hearing loss (SNHL) of both ears 08/15/2021   Underweight 03/31/2021   Statin myopathy 03/09/2021   Achilles tendon disorder, right 02/14/2021   Memory problem 01/19/2020   Ganglion cyst of left foot 01/11/2020   Mass of foot 12/14/2019   Dehydration 10/29/2019   Gastroenteritis 03/23/2019   IBS (irritable bowel syndrome) 01/19/2019   Osteoporosis 01/13/2019   Coronary atherosclerosis due to calcified coronary lesion 06/11/2018   Sternoclavicular joint pain, right 06/12/2017   Left ulnar fracture 09/06/2016   Post-nasal drip 05/02/2016   Carotid bruit 02/17/2016   Stress at home 10/18/2015   Swelling of foot joint 10/14/2014   Clostridium difficile colitis 10/04/2014   Diarrhea 10/04/2014   Left hand weakness 08/10/2014   Numbness of left hand 04/19/2014   Lipoma of arm 01/23/2012   Goals of care, counseling/discussion 04/03/2011   Edema 02/13/2011   Shoulder pain, right 01/30/2011   Bronchiectasis ([place]) 01/23/2011   Vertigo 09/13/2010   Allergic rhinitis 09/13/2010   CONSTIPATION 03/22/2010    LUMBAGO 03/22/2010   URINARY FREQUENCY, CHRONIC 03/22/2010   Situational mixed anxiety and depressive disorder 02/25/2008   Cough 02/17/2008   Anxiety disorder 02/07/2007   INSOMNIA-SLEEP DISORDER-UNSPEC 02/07/2007   Weight loss 02/07/2007   Dyslipidemia 11/21/2006   OSTEOPENIA 11/21/2006   ALLERGY 11/21/2006   PCP:  Plotnikov, Evie Lacks, MD Pharmacy:   [id number] [place], [place] [id number]: [phone]: [phone]    Social Determinants of Health (SDOH) Interventions    Readmission Risk Interventions    03/08/2022    3:28 PM 02/27/2022   12:23 PM 02/12/2022    9:29 AM  Readmission Risk Prevention Plan  Transportation Screening Complete Complete Complete  PCP or Specialist Appt within 3-5 Days  Complete Complete  HRI or HS[place] Complete Complete  Social Work Consult for RLamoniPlanning/Counseling  Complete Complete  Palliative Care Screening  Not Applicable Not Applicable  Medication Review (Press photographer Complete Complete Complete  HRI or Home Care Consult Complete    SW Recovery Care/Counseling Consult Complete    Palliative Care Screening Complete    SSumnerNot Applicable

## 2022-04-11 NOTE — Plan of Care (Signed)

## 2022-04-11 NOTE — Progress Notes (Signed)
[facility] ADULT ICU REPLACEMENT PROTOCOL   The patient does apply for the [facility] Adult ICU Electrolyte Replacment Protocol based on the criteria listed below:   1.Exclusion criteria: TCTS, ECMO, Dialysis, and Myasthenia Gravis patients 2. Is GFR >/= 30 ml/min? Yes.    [name] GFR today is >60 3. Is SCr </= 2? Yes.   [name] SCr is 0.49 mg/dL 4. Did SCr increase >/= 0.5 in 24 hours? No. 5.Pt's weight >40kg  Yes.   6. Abnormal electrolyte(s): K+ 3.4  7. Electrolytes replaced per protocol 8.  Call MD STAT for K+ </= 2.5, Phos </= 1, or Mag </= 1 Physician:  n/a  Darlys Gales 04/11/2022 6:30 AM

## 2022-04-11 NOTE — Progress Notes (Signed)
Daily Progress Note   Patient Name: Julie Montgomery       Date: 04/11/2022 DOB: 03/29/1936  Age: 86 y.o. MRN#: [id number] Attending Physician: Julian Hy, DO Primary Care Physician: Cassandria Anger, MD Admit Date: 04/08/2022  Reason for Consultation/Follow-up: Establishing goals of care  Subjective: I have reviewed medical records including EPIC notes and labs. Received report from primary RN - no acute concerns. Patient has orders for transfer out of the ICU today - off pressors.  Went to visit patient at bedside - husband/[name] present. Patient was lying in bed awake, alert, oriented x3, but remains unable to make complex medical decisions. She does not remember the in depth code status conversation or my visit in general from yesterday - she asks "what did we talk about?" Briefly updated on conversation yesterday and her decisions; however, she does not seem to grasp concepts. She perseverates on topics and repeats the same questions.   Met with husband/[name] in person in [place] conference room as well as son/[name] via speakerphone. Expressed concern to family that patient is not able to make appropriate decisions for herself. Encouraged family to assist patient in making decisions as appropriate. Family understand that medical team recommends DNR/DNI in context of her current acute medical situation in conjunction with her multiple comorbidities and high risk for decline/recurrent hospitalizations. Therapeutic listening provided as family reflect on what patient has expressed as her goals for the last 10 years - wanting to live. We did discuss that as people's health changes over time and quality of life declines, people often change their minds on what they are willing to go through based on  the quality of life we would bring them back to. Family express understanding - no changes to code status today - remain full code. Family do tell me they "do not want her to suffer" and "as long as she's aware and comfortable, that's a good day." Education provided on what step-wise decisions family would face after intubation to include trial period of ventilatory support/transition to full comfort if no improvement vs long term support with tracheostomy if no improvement - family would not want patient's life prolonged long term with tracheostomy and would only wish for trial period of intubation. Son tells me he "would not want her body tortured and beat up."  Family  understand that dementia is a non-curable, progressive disease underlying her current acute medical situation. We reviewed specific indicators of end stage dementia, including inability to communicate, bed bound/non-ambulatory status, decreased oral intake, swallowing dysfunction, and incontinence of bowel/bladder. Reviewed that acute illnesses such as the multiple ones patient has faced over the last several months can cause faster disease progression - family do indicate they have noted a decline in her mental/physical/swallowing status.   Discussed disposition options in detail to include rehab +/- outpatient Palliative Care vs hospice. Discussed that the goal of rehab is improvement/stabalization of functional status,  which can be a difficult goal to meet for patients with advanced illness and multiple medical conditions. Reviewed what is needed for someone to have a positive rehabilitation experience to include adequate nutritional intake as well as willingness/ability to participate - expressed concern patient does not meet these two criteria and family agree. Family tell me that patient hasn't found much benefit with PT/OT in the past. Provided education and counseling at length on the philosophy and benefits of hospice care. Discussed  that it offers a holistic approach to care in the setting of end-stage illness, and is about supporting the patient where they are allowing nature to take it's course. Discussed the hospice team includes RNs, physicians, social workers, and chaplains. They can provide personal care, support for the family, and help keep patient out of the hospital as well as assist with DME needs for home hospice. Education provided on the difference between home vs residential hospice. Reviewed that home hospice could be in patient's own home with family/friend/private duty caregiver 24/7 supervision/support vs LTC. Education provided that patient could not receive PT/OT and hospice services at the same time - family expressed understanding. Family indicate they are most interested in patient returning home, hopefully with private duty caregiver support, and hospice - will notify TOC to assist family with private duty caregiver information. Per family request, reviewed that private duty caregivers are usually an OOP cost; however, do encourage to check with patient's insurance. [name] does express concern that without extra support, he will not be able to continue caring for patient in the home.  Prognostication reviewed.   All questions and concerns addressed. Encouraged to call with questions and/or concerns. PMT card previously provided.  After phone call ended with son/[name], therapeutic listening provided to [name] as he indicates his wishes for patient to be DNR/DNI. He would not want to put patient through "aggressive" things and would want her to pass peacefully. Encouraged [name] to continue discussions around his thoughts with family. Informed [name] that at any time, code status could be changed if he wished to change it. [name] seems to be overwhelmed with patient's current situation and worry over not getting support he needs to care for her. Validated that caregiving is very hard and that PMT as well as medical  team would continue discussions to find supportive resources. He expressed appreciation.  Length of Stay: 2  Current Medications: Scheduled Meds:   amiodarone  200 mg Oral Daily   apixaban  2.5 mg Oral BID   bumetanide  0.5 mg Oral BID   Chlorhexidine Gluconate Cloth  6 each Topical Daily   feeding supplement  237 mL Oral BID BM   leptospermum manuka honey  1 Application Topical Daily   memantine  5 mg Oral BID   midodrine  10 mg Oral TID WC   vancomycin  125 mg Oral BID    Continuous Infusions:  sodium chloride  Stopped (04/11/22 0520)   piperacillin-tazobactam (ZOSYN)  IV 12.5 mL/hr at 04/11/22 0800    PRN Meds: sodium chloride, oxyCODONE  Physical Exam Vitals and nursing note reviewed.  Constitutional:      General: She is not in acute distress.    Appearance: She is ill-appearing.  Pulmonary:     Effort: No respiratory distress.  Skin:    General: Skin is warm and dry.  Neurological:     Mental Status: She is alert. She is confused.     Motor: Weakness present.  Psychiatric:        Mood and Affect: Mood is anxious.        Cognition and Memory: Cognition is impaired. Memory is impaired.        Judgment: Judgment is impulsive.             Vital Signs: BP (!) 108/48 (BP Location: Left Arm)   Pulse (!) 59   Temp 97.9 F (36.6 C) (Oral)   Resp 12   Wt 46.8 kg   SpO2 100%   BMI 18.87 kg/m  SpO2: SpO2: 100 % O2 Device: O2 Device: Nasal Cannula O2 Flow Rate: O2 Flow Rate (L/min): 3 L/min  Intake/output summary:  Intake/Output Summary (Last 24 hours) at 04/11/2022 0917 Last data filed at 04/11/2022 0800 Gross per 24 hour  Intake 564.57 ml  Output 1175 ml  Net -610.43 ml   LBM: Last BM Date : 04/11/22 Baseline Weight: Weight: 46.8 kg Most recent weight: Weight: 46.8 kg       Palliative Assessment/Data: PPS 30-40%      Patient Active Problem List   Diagnosis Date Noted   HCAP (healthcare-associated pneumonia) 04/09/2022   Septic shock ([place])  04/09/2022   Elevated brain natriuretic peptide (BNP) level 04/09/2022   Bilateral pleural effusion 04/09/2022   Status post thoracentesis 04/09/2022   Peritonitis ([place]) 03/22/2022   Abnormal x-ray of abdomen 03/08/2022   Adynamic ileus ([place]) 03/08/2022   Protein-calorie malnutrition, severe 03/07/2022   Hypercalcemia 03/06/2022   Anemia of chronic disease 03/06/2022   Ileus ([place]) 03/06/2022   Abdominal pain 03/05/2022   Atrial fibrillation with RVR ([place]) 02/28/2022   Long term current use of antiarrhythmic drug 02/28/2022   History of TIA (transient ischemic attack) 02/28/2022   Atherosclerosis of aorta ([place]) 02/28/2022   Recurrent colitis due to Clostridium difficile 02/22/2022   Infectious colitis    C. difficile diarrhea 02/07/2022   Dyspnea 01/26/2022   Hyponatremia 05/08/3233   Metabolic acidosis 57/32/2025   C. difficile colitis 01/11/2022   Paroxysmal atrial fibrillation ([place]) 12/19/2021   Recurrent Clostridioides difficile infection 12/19/2021   Paroxysmal atrial fibrillation with RVR ([place]) 12/14/2021   Protein calorie malnutrition ([place]) 12/13/2021   Dehydration with hyponatremia 12/12/2021   Wound infection following procedure 11/29/2021   Breast lump 08/15/2021   History of hormone replacement therapy 08/15/2021   Hyperlipidemia 08/15/2021   Osteopenia 08/15/2021   Sensorineural hearing loss (SNHL) of both ears 08/15/2021   Underweight 03/31/2021   Statin myopathy 03/09/2021   Achilles tendon disorder, right 02/14/2021   Memory problem 01/19/2020   Ganglion cyst of left foot 01/11/2020   Mass of foot 12/14/2019   Dehydration 10/29/2019   Gastroenteritis 03/23/2019   IBS (irritable bowel syndrome) 01/19/2019   Osteoporosis 01/13/2019   Coronary atherosclerosis due to calcified coronary lesion 06/11/2018   Sternoclavicular joint pain, right 06/12/2017   Left ulnar fracture 09/06/2016   Post-nasal drip 05/02/2016   Carotid bruit 02/17/2016   Stress  at home  10/18/2015   Swelling of foot joint 10/14/2014   Clostridium difficile colitis 10/04/2014   Diarrhea 10/04/2014   Left hand weakness 08/10/2014   Numbness of left hand 04/19/2014   Lipoma of arm 01/23/2012   Goals of care, counseling/discussion 04/03/2011   Edema 02/13/2011   Shoulder pain, right 01/30/2011   Bronchiectasis ([place]) 01/23/2011   Vertigo 09/13/2010   Allergic rhinitis 09/13/2010   CONSTIPATION 03/22/2010   LUMBAGO 03/22/2010   URINARY FREQUENCY, CHRONIC 03/22/2010   Situational mixed anxiety and depressive disorder 02/25/2008   Cough 02/17/2008   Anxiety disorder 02/07/2007   INSOMNIA-SLEEP DISORDER-UNSPEC 02/07/2007   Weight loss 02/07/2007   Dyslipidemia 11/21/2006   OSTEOPENIA 11/21/2006   ALLERGY 11/21/2006    Palliative Care Assessment & Plan   Patient Profile: 86 y.o. female  with past medical history of dementia, recurrent C. difficile infection, permanent A-fib on Eliquis, chronic hyponatremia,  failure to thrive, stroke, H. pylori, abdominal perforation with subtotal colectomy and end ileostomy (03/21/2022) during recent prolonged hospitalization 03/05/22-03/30/22  admitted on 04/08/2022 with shortness of breath and bodyaches.    Patient was initially seen at [facility] ED and transferred to [facility] upon recommendation of critical care.  She is now admitted with healthcare acquired pneumonia, sepsis, acute hypoxic respiratory failure, acute diastolic heart failure exacerbation. PMT has been consulted to assist with goals of care conversation.  Patient is familiar to our service from her last hospitalization.  Assessment: Principal Problem:   HCAP (healthcare-associated pneumonia) Active Problems:   Coronary atherosclerosis due to calcified coronary lesion   Long term current use of antiarrhythmic drug   Atherosclerosis of aorta (HCC)   Septic shock (H[facility])   Elevated brain natriuretic peptide (BNP) level   Bilateral pleural effusion   Status post  thoracentesis   Concern about end of life  Recommendations/Plan: Continue full scope care Continue full code - if intubation is medically indicated, family would only want this for a trial period. If patient' does not improve, they would be open for transition to full comfort. No tracheostomy  Family are interested in patient's discharge home with hospice in addition to private duty caregivers [facility] notified and consulted for: family's request for private duty caregiver information and home hospice Ongoing [place] pending clinical course PMT will continue to follow and support holistically  Goals of Care and Additional Recommendations: Limitations on Scope of Treatment: Full Scope Treatment  Code Status:    Code Status Orders  (From admission, onward)           Start     Ordered   04/09/22 0400  Full code  Continuous        04/09/22 0401           Code Status History     Date Active Date Inactive Code Status Order ID Comments User Context   03/05/2022 2227 03/30/2022 1901 Full Code [id number]  Rhetta Mura, DO ED   02/22/2022 1540 02/28/2022 2354 Full Code [id number]  Karmen Bongo, MD ED   02/07/2022 0324 02/12/2022 1638 Full Code [id number]  Kayleen Memos, DO ED   01/11/2022 1704 01/13/2022 1943 Full Code [id number]  Lequita Halt, MD ED   12/13/2021 0638 12/18/2021 1908 Full Code [id number]  Shalhoub, Sherryll Burger, MD Inpatient       Prognosis:  Poor long-term prognosis given recurrent acute illnesses/hospitalizations, chronic debility, and failure to thrive   Discharge Planning: Home with Hospice  Care plan was discussed with primary RN, patient's  family, Dr. Carlis Abbott, [facility]  Thank you for allowing the Palliative Medicine Team to assist in the care of this patient.   Total Time 70 minutes Prolonged Time Billed  yes       Greater than 50%  of this time was spent counseling and coordinating care related to the above assessment and plan.  Lin Landsman, NP  Please  contact Palliative Medicine Team phone at [phone] for questions and concerns.   *Portions of this note are a verbal dictation therefore any spelling and/or grammatical errors are due to the "[employer]" system interpretation.

## 2022-04-11 NOTE — Evaluation (Signed)
Occupational Therapy Evaluation Patient Details Name: Julie Montgomery MRN: [id number] DOB: 1935-05-28 Today's Date: 04/11/2022   History of Present Illness 86 year old woman who presented to [facility] ED 12/10 with SOB, body aches, nausea and lethargy with FTT. Transferred to [facility] with sepsis, PNA and UTI.  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 She has been hospitalized 5 times in 2023. Most recent, patient was discharged to [facility] 12/1.   Clinical Impression   Pt unreliable historian today. Per chart review, Pt with recent stay at SNF, getting mod A for ADL (bathing/dressing/toileting) and using RW for mobility. She reportedly sleeps in recliner/lift chair. Today Pt presents with deficits in cognition, balance, strength, activity tolerance and is overall min A for UB ADL and max to total A for LB ADL. Mod A +2 for initial transfers and min A +2 for short in room mobility with HHA. Pt will benefit from skilled OT in the acute setting as well as afterwards at the [facility] level (need to confirm with family they still have caregiver support in place) to maximize safety and independence in ADL and functional transfers.       Recommendations for follow up therapy are one component of a multi-disciplinary discharge planning process, led by the attending physician.  Recommendations may be updated based on patient status, additional functional criteria and insurance authorization.   Follow Up Recommendations  Home health OT (pending progress from therapist and discussion with Palliative medicine)     Assistance Recommended at Discharge Frequent or constant Supervision/Assistance  Patient can return home with the following A lot of help with walking and/or transfers;A lot of help with bathing/dressing/bathroom;Assistance with  cooking/housework;Direct supervision/assist for medications management;Direct supervision/assist for financial management;Assist for transportation;Help with stairs or ramp for entrance    Functional Status Assessment  Patient has had a recent decline in their functional status and demonstrates the ability to make significant improvements in function in a reasonable and predictable amount of time.  Equipment Recommendations  None recommended by OT (Pt has appropriate DME)    Recommendations for Other Services PT consult;Speech consult (Palliative Medicine)     Precautions / Restrictions Precautions Precautions: Fall;Other (comment) Precaution Comments: ostomy, bil foot wounds, "tailbone hurts" Restrictions Weight Bearing Restrictions: No      Mobility Bed Mobility Overal bed mobility: Needs Assistance Bed Mobility: Rolling, Sidelying to Sit Rolling: Mod assist Sidelying to sit: Mod assist, +2 for physical assistance       General bed mobility comments: Needed assist to come to EOB as well as cues.  Did initiate movement but needed assist to complete    Transfers Overall transfer level: Needs assistance   Transfers: Sit to/from Stand, Bed to chair/wheelchair/BSC Sit to Stand: Mod assist, +2 physical assistance, From elevated surface     Step pivot transfers: Min assist, +2 physical assistance     General transfer comment: pt pulling up on PT and OT hands with mod assist to power up.  Once up, min assist to step pivot to chair with bil UE support.      Balance Overall balance assessment: Needs assistance Sitting-balance support: No upper extremity supported, Feet supported, Bilateral upper extremity supported Sitting balance-Leahy Scale: Fair Sitting balance - Comments: fair sitting balance with and without UE support   Standing balance support: Bilateral upper extremity supported Standing balance-Leahy Scale: Poor Standing balance comment: reliant on bil  UE support  and tactile cues for balance                           ADL either performed or assessed with clinical judgement   ADL Overall ADL's : Needs assistance/impaired Eating/Feeding: Minimal assistance;Sitting Eating/Feeding Details (indicate cue type and reason): opening containers etc Grooming: Minimal assistance;Sitting   Upper Body Bathing: Moderate assistance   Lower Body Bathing: Total assistance   Upper Body Dressing : Moderate assistance;Sitting   Lower Body Dressing: Total assistance   Toilet Transfer: Moderate assistance;+2 for physical assistance;+2 for safety/equipment;Stand-pivot Toilet Transfer Details (indicate cue type and reason): 2 person HHA Toileting- Clothing Manipulation and Hygiene: Maximal assistance;+2 for physical assistance;+2 for safety/equipment;Sit to/from stand       Functional mobility during ADLs: Minimal assistance;+2 for physical assistance;+2 for safety/equipment (2 person HHA)       Vision Baseline Vision/History: 1 Wears glasses Patient Visual Report: No change from baseline       Perception     Praxis      Pertinent Vitals/Pain Pain Assessment Pain Assessment: Faces Faces Pain Scale: Hurts even more Pain Location: bottom/sacrum Pain Descriptors / Indicators: Discomfort, Guarding, Sore Pain Intervention(s): Limited activity within patient's tolerance, Monitored during session, Repositioned, Other (comment) (special seat cushion in recliner)     Hand Dominance Right   Extremity/Trunk Assessment Upper Extremity Assessment Upper Extremity Assessment: Generalized weakness   Lower Extremity Assessment Lower Extremity Assessment: Defer to PT evaluation   Cervical / Trunk Assessment Cervical / Trunk Assessment: Normal   Communication Communication Communication: HOH   Cognition Arousal/Alertness: Awake/alert Behavior During Therapy: Anxious Overall Cognitive Status: History of cognitive impairments - at baseline Area  of Impairment: Memory, Problem solving, Awareness, Following commands, Safety/judgement, Attention                 Orientation Level: Disoriented to, Time Current Attention Level: Sustained Memory: Decreased short-term memory Following Commands: Follows one step commands with increased time, Follows one step commands inconsistently Safety/Judgement: Decreased awareness of deficits, Decreased awareness of safety Awareness: Intellectual Problem Solving: Slow processing, Difficulty sequencing, Requires verbal cues, Decreased initiation General Comments: cognitive impairments at baseline, pt with STM challenges (repeats herself); perseverating on eating the rest of breakfast and kept asking about it even though PT and OT had told pt we were getting her up to finish eating.     General Comments  VAA on RA    Exercises     Shoulder Instructions      Home Living Family/patient expects to be discharged to:: Private residence Living Arrangements: Spouse/significant other Available Help at Discharge: Family;Personal care attendant;Available 24 hours/day (Son comes in morning to help pt up, breakfast, etc; other son comes in afternoon to check; then has aide from 3-8pm to assist with dinner and nighttime routine) Type of Home: House Home Access: Stairs to enter [employer] of Steps: 2   Home Layout: One level     Bathroom Shower/Tub: Occupational psychologist: Standard     Home Equipment: Conservation officer, nature (2 wheels);Wheelchair - Electrical engineer Comments: son provided PLOF      Prior Functioning/Environment Prior Level of Function : Needs assist             Mobility Comments: Pt recently in SNF and prior to that she was ambulating in home with RW and supervision/min guard ADLs Comments: Pt recently hospitalized and in SNF and since that time  light min A for ADLs        OT Problem List: Decreased strength;Decreased activity  tolerance;Impaired balance (sitting and/or standing);Decreased coordination;Decreased cognition;Decreased safety awareness;Decreased knowledge of use of DME or AE      OT Treatment/Interventions: Self-care/ADL training;DME and/or AE instruction;Therapeutic activities;Patient/family education;Balance training    OT Goals(Current goals can be found in the care plan section) Acute Rehab OT Goals Patient Stated Goal: eat breakfast OT Goal Formulation: With patient Time For Goal Achievement: 04/25/22 Potential to Achieve Goals: Fair ADL Goals Pt Will Perform Grooming: with set-up;sitting Pt Will Perform Upper Body Dressing: with set-up;sitting Pt Will Perform Lower Body Dressing: with mod assist;sit to/from stand Pt Will Transfer to Toilet: with min guard assist;ambulating;regular height toilet Pt Will Perform Toileting - Clothing Manipulation and hygiene: with min guard assist;sit to/from stand Additional ADL Goal #1: Pt will mod I in and out of recliner (sleeps and stays in lift chair)  OT Frequency: Min 2X/week    Co-evaluation PT/OT/SLP Co-Evaluation/Treatment: Yes Reason for Co-Treatment: Complexity of the patient's impairments (multi-system involvement);Necessary to address cognition/behavior during functional activity;For patient/therapist safety;To address functional/ADL transfers PT goals addressed during session: Mobility/safety with mobility;Balance;Proper use of DME;Strengthening/ROM OT goals addressed during session: ADL's and self-care;Strengthening/ROM      AM-PAC OT "6 Clicks" Daily Activity     Outcome Measure Help from another person eating meals?: A Little Help from another person taking care of personal grooming?: A Little Help from another person toileting, which includes using toliet, bedpan, or urinal?: A Lot Help from another person bathing (including washing, rinsing, drying)?: A Lot Help from another person to put on and taking off regular upper body clothing?: A  Lot Help from another person to put on and taking off regular lower body clothing?: Total 6 Click Score: 13   End of Session Equipment Utilized During Treatment: Gait belt Nurse Communication: Mobility status;Precautions  Activity Tolerance: Patient tolerated treatment well (impacted by pain) Patient left: in chair;with call bell/phone within reach;with chair alarm set;Other (comment) (extra cushion in chair)  OT Visit Diagnosis: Unsteadiness on feet (R26.81);Other abnormalities of gait and mobility (R26.89);Muscle weakness (generalized) (M62.81);Other symptoms and signs involving cognitive function;Pain Pain - part of body:  (tailbone/sacrum)                Time: 2202-5427 OT Time Calculation (min): 21 min Charges:  OT General Charges $OT Visit: 1 Visit OT Evaluation $OT Eval Moderate Complexity: [place] OTR/L Acute Rehabilitation Services Office: [place] 04/11/2022, 1:02 PM

## 2022-04-11 NOTE — Procedures (Signed)
Insertion of Chest Tube Procedure Note  Julie Montgomery  [id number]  14-Oct-1935  Date:04/11/22  Time:2:37 PM    Provider Performing: Clementeen Graham   Procedure: Pleural Catheter Insertion w/ Imaging Guidance [phone])  Indication(s) Effusion  Consent Risks of the procedure as well as the alternatives and risks of each were explained to the patient and/or caregiver.  Consent for the procedure was obtained and is signed in the bedside chart  Anesthesia Topical only with 1% lidocaine    Time Out Verified patient identification, verified procedure, site/side was marked, verified correct patient position, special equipment/implants available, medications/allergies/relevant history reviewed, required imaging and test results available.   Sterile Technique Maximal sterile technique including full sterile barrier drape, hand hygiene, sterile gown, sterile gloves, mask, hair covering, sterile ultrasound probe cover (if used).   Procedure Description Ultrasound used to identify appropriate pleural anatomy for placement and overlying skin marked. Area of placement cleaned and draped in sterile fashion.  A 14 French pigtail pleural catheter was placed into the left pleural space using Seldinger technique. Appropriate return of fluid was obtained.  The tube was connected to atrium and placed on -20 cm H2O wall suction.   Complications/Tolerance None; patient tolerated the procedure well. Chest X-ray is ordered to verify placement.   EBL Minimal  Specimen(s) none  Erick Colace ACNP-BC [place] Pager # [phone] OR # [phone] if no answer

## 2022-04-11 NOTE — Progress Notes (Signed)
ANTICOAGULATION CONSULT NOTE - Initial Consult  Pharmacy Consult for heparin Indication: atrial fibrillation  Allergies  Allergen Reactions   Lactose Intolerance (Gi) Other (See Comments)    Intolerance    Lovastatin Other (See Comments)    Unknown reaction   Mometasone Furo-Formoterol Fum Other (See Comments)    Loss of appetite, laryngitis    Peanut-Containing Drug Products Other (See Comments)   Prozac [Fluoxetine Hcl] Other (See Comments)    Jumpy   Sulfa Antibiotics Other (See Comments)    Unknown reaction    Patient Measurements: Weight: 46.8 kg (103 lb 2.8 oz) Heparin Dosing Weight: 46.8 kg (TBW)  Vital Signs: Temp: 97.8 F (36.6 C) (12/13 1705) Temp Source: Oral (12/13 1705) BP: 110/60 (12/13 1705) Pulse Rate: 58 (12/13 1705)  Labs: Recent Labs    04/08/22 1825 04/08/22 2215 04/09/22 0322 04/09/22 2023 04/10/22 0342 04/11/22 0307  HGB 9.5*  --    < > 8.9* 8.3* 8.3*  HCT 29.6*  --    < > 27.1* 25.1* 24.5*  PLT 531*  --   --  388 331 328  APTT 33  --   --   --   --   --   LABPROT 13.9  --   --   --   --   --   INR 1.1  --   --   --   --   --   CREATININE 0.48  --   --  0.50 0.42* 0.49  TROPONINIHS 8 7  --   --   --   --    < > = values in this interval not displayed.    Estimated Creatinine Clearance: 38.7 mL/min (by C-G formula based on SCr of 0.49 mg/dL).   Medical History: Past Medical History:  Diagnosis Date   Adenomatous polyp of colon 2020   Allergy    Anxiety    Basal cell carcinoma (BCC)    Cataract    Depression    Helicobacter pylori gastritis    Osteopenia    Paroxysmal atrial fibrillation (HCC)    Pneumonia    hx of 03/2008   Stroke [facility])    TIA long time ago    Medications:  Infusions:   sodium chloride Stopped (04/11/22 1414)   piperacillin-tazobactam (ZOSYN)  IV 12.5 mL/hr at 04/11/22 1600    Assessment: GK is an 86 yo female admitted on 04/08/2022 with aspiration PNA. Patient had pleural catheter inserted 12/13 and  pharmacy consulted for heparin dosing for A-fib. Patient was on Eliquis prior to admission and has been on Eliquis inpatient. Last Eliquis dose 04/11/2022 at 1045. Expect heparin levels to be falsely elevated from Eliquis dose this morning (12/13). Will use aPTT for drug monitoring until aPTT and anti-Xa correlate. Hgb 8.3 and stable, plt 328. Baseline aPTT of 33 on 04/08/2022.  Goal of Therapy:  Heparin level 0.3-0.7 units/ml aPTT 66-102 seconds Monitor platelets by anticoagulation protocol: Yes   Plan:  Start heparin infusion at 650 units/hr at 12/13 at 2200 Check aPTT and anti-Xa level in 6 hours and daily while on heparin Continue to monitor H&H and platelets.   Jeneen Rinks, Pharm.D PGY1 Pharmacy Resident 04/11/2022 5:34 PM

## 2022-04-11 NOTE — Progress Notes (Signed)
Progress Note  Patient Name: Julie Montgomery Date of Encounter: 04/11/2022  Attending physician: Julian Hy, DO Primary care provider: Plotnikov, Evie Lacks, MD  Subjective: KINZE LABO is a 86 y.o. female who was seen and examined at bedside  No chest pain.  Continues to have LE swelling  Sinus on telemetry  Husband at bedside and CCM PA evaluated the patient for possible thoracentesis vs. Pleural catheter.  Case discussed and reviewed with her nurse.  Objective: Vital Signs in the last 24 hours: Temp:  [97.6 F (36.4 C)-98.3 F (36.8 C)] 97.6 F (36.4 C) (12/13 1108) Pulse Rate:  [51-84] 59 (12/13 1630) Resp:  [10-30] 17 (12/13 1630) BP: (84-133)/(41-90) 110/50 (12/13 1630) SpO2:  [95 %-100 %] 100 % (12/13 1630)  Intake/Output:  Intake/Output Summary (Last 24 hours) at 04/11/2022 1638 Last data filed at 04/11/2022 1600 Gross per 24 hour  Intake 168.81 ml  Output 900 ml  Net -731.19 ml    Net IO Since Admission: -3,274.94 mL [04/11/22 1638]  Weights:  Filed Weights   04/09/22 1813  Weight: 46.8 kg    Telemetry: Personally reviewed. Overall SR or SB  Physical examination: PHYSICAL EXAM:    04/11/2022    4:30 PM 04/11/2022    4:00 PM 04/11/2022    3:30 PM  Vitals with BMI  Systolic 096 045 88  Diastolic 50 57 59  Pulse 59 60 55   Physical Exam  Constitutional: She appears cachectic. No distress. She appears chronically ill.  HENT:  Dry mucous membranes   Neck: No JVD present.  Cardiovascular: Normal rate, regular rhythm, S1 normal, S2 normal, intact distal pulses and normal pulses. Exam reveals no gallop, no S3 and no S4.  No murmur heard. Pulmonary/Chest: No stridor. She has no wheezes. She has no rales.  Poor inspiratory effort, unable to sit upright, decreased breath sounds bilaterally at the bases with rhonchi's in the upper lung fields   Abdominal: Soft. She exhibits no distension. There is no abdominal tenderness.  Hypoactive bowel  sounds, ileostomy bag present with green-colored feces.   Musculoskeletal:        General: Edema ((Bilateral lower extremity extending to the thighs)) present.     Cervical back: Neck supple.  Neurological: She is alert and oriented to person, place, and time. She has intact cranial nerves (2-12).   Lab Results: Hematology Recent Labs  Lab 04/09/22 2023 04/10/22 0342 04/11/22 0307  WBC 14.2* 11.2* 12.8*  RBC 2.96* 2.74* 2.74*  HGB 8.9* 8.3* 8.3*  HCT 27.1* 25.1* 24.5*  MCV 91.6 91.6 89.4  MCH 30.1 30.3 30.3  MCHC 32.8 33.1 33.9  RDW 17.3* 17.4* 16.9*  PLT 388 331 328    Chemistry Recent Labs  Lab 04/08/22 1825 04/09/22 0322 04/09/22 2023 04/10/22 0342 04/11/22 0307  NA 131*   < > 134* 135 130*  K 5.7*   < > 4.1 3.7 3.4*  CL 98  --  101 100 95*  CO2 24  --  '25 27 27  '$ GLUCOSE 84  --  100* 89 106*  BUN 27*  --  '15 11 14  '$ CREATININE 0.48  --  0.50 0.42* 0.49  CALCIUM 10.2  --  9.2 8.8* 8.9  PROT 7.0  --   --   --   --   ALBUMIN 3.8  --   --   --   --   AST 43*  --   --   --   --  ALT 50*  --   --   --   --   ALKPHOS 176*  --   --   --   --   BILITOT 0.3  --   --   --   --   GFRNONAA >60  --  >60 >60 >60  ANIONGAP 9  --  '8 8 8   '$ < > = values in this interval not displayed.     Cardiac Enzymes: Cardiac Panel (last 3 results) Recent Labs    04/08/22 1825 04/08/22 2215  TROPONINIHS 8 7    BNP (last 3 results) Recent Labs    03/20/22 1840 03/21/22 0737 04/08/22 1825  BNP 286.1* 117.3* 904.3*    ProBNP (last 3 results) Recent Labs    01/26/22 1447  PROBNP 68.0     DDimer No results for input(s): "DDIMER" in the last 168 hours.   Hemoglobin A1c:  Lab Results  Component Value Date   HGBA1C 5.4 03/12/2022   MPG 108.28 03/12/2022    TSH  Recent Labs    12/14/21 1700 01/26/22 1447 02/28/22 1056  TSH 6.357* 3.61 4.151    Lipid Panel     Component Value Date/Time   CHOL 156 12/15/2021 1128   TRIG 53 03/26/2022 1422   TRIG 64  05/14/2006 0941   HDL 53 12/15/2021 1128   CHOLHDL 2.9 12/15/2021 1128   VLDL 17 12/15/2021 1128   LDLCALC 86 12/15/2021 1128   LDLDIRECT 82 12/15/2021 0056    Imaging: DG Chest Port 1 View  Result Date: 04/11/2022 CLINICAL DATA:  Reason for exam: S/P thoracostomy tube placement EXAM: PORTABLE CHEST - 1 VIEW COMPARISON:  Earlier film of the same day FINDINGS: Pigtail left chest tube has been placed laterally at the left lung base. Significant interval decrease in size of pleural effusion. Trace apical pneumothorax. Coarse suprahilar airspace opacities appear slightly increased. Heart size and mediastinal contours are within normal limits. Aortic Atherosclerosis (ICD10-170.0). Persistent small right pleural effusion with atelectasis/consolidation in the right lung base. Visualized bones unremarkable. IMPRESSION: 1. Left chest tube placement with significant decrease in size of pleural effusion. 2. Trace left apical pneumothorax. Electronically Signed   By: Lucrezia Europe M.D.   On: 04/11/2022 15:02   DG CHEST PORT 1 VIEW  Result Date: 04/11/2022 CLINICAL DATA:  Pleural effusion EXAM: PORTABLE CHEST 1 VIEW COMPARISON:  Previous studies including the examination of 04/09/2022 FINDINGS: Small to moderate bilateral pleural effusions are seen. There is interval increase in amount of right pleural effusion. There is no pneumothorax. There are patchy infiltrates in both upper lung fields, more so on the right side with interval improvement in the left upper lobe infiltrate. Infiltrates are seen in both lower lung fields suggesting atelectasis/pneumonia. There is no pneumothorax. Degenerative changes are noted in right shoulder. IMPRESSION: Small to moderate bilateral pleural effusions. There is interval increase in amount of right pleural effusion. There are patchy infiltrates in both lungs suggesting possible multifocal pneumonia with slight improvement in the left upper lobe infiltrate. Electronically Signed    By: Elmer Picker M.D.   On: 04/11/2022 13:13   VAS [place] LOWER EXTREMITY VENOUS (DVT)  Result Date: 04/10/2022  Lower Venous DVT Study Patient Name:  Julie Montgomery  Date of Exam:   04/10/2022 Medical Rec #: [id number]        Accession #:    [id number] Date of Birth: [birth year]       Patient Gender: F Patient Age:   65 years  Exam Location[facility]  [facility] Procedure:      VAS [place] LOWER EXTREMITY VENOUS (DVT) Referring Phys: Rex Kras --------------------------------------------------------------------------------  Indications: Swelling, and Edema.  Performing Technologist: Archie Patten RVS  Examination Guidelines: A complete evaluation includes B-mode imaging, spectral Doppler, color Doppler, and power Doppler as needed of all accessible portions of each vessel. Bilateral testing is considered an integral part of a complete examination. Limited examinations for reoccurring indications may be performed as noted. The reflux portion of the exam is performed with the patient in reverse Trendelenburg.  +---------+---------------+---------+-----------+----------+--------------+ RIGHT    CompressibilityPhasicitySpontaneityPropertiesThrombus Aging +---------+---------------+---------+-----------+----------+--------------+ CFV      Full           Yes      Yes                                 +---------+---------------+---------+-----------+----------+--------------+ SFJ      Full                                                        +---------+---------------+---------+-----------+----------+--------------+ FV Prox  Full                                                        +---------+---------------+---------+-----------+----------+--------------+ FV Mid   Full                                                        +---------+---------------+---------+-----------+----------+--------------+ FV DistalFull                                                         +---------+---------------+---------+-----------+----------+--------------+ PFV      Full                                                        +---------+---------------+---------+-----------+----------+--------------+ POP      Full           Yes      Yes                                 +---------+---------------+---------+-----------+----------+--------------+ PTV      Full                                                        +---------+---------------+---------+-----------+----------+--------------+ PERO     Full                                                        +---------+---------------+---------+-----------+----------+--------------+   +---------+---------------+---------+-----------+----------+--------------+  LEFT     CompressibilityPhasicitySpontaneityPropertiesThrombus Aging +---------+---------------+---------+-----------+----------+--------------+ CFV      Full           Yes      Yes                                 +---------+---------------+---------+-----------+----------+--------------+ SFJ      Full                                                        +---------+---------------+---------+-----------+----------+--------------+ FV Prox  Full                                                        +---------+---------------+---------+-----------+----------+--------------+ FV Mid   Full                                                        +---------+---------------+---------+-----------+----------+--------------+ FV DistalFull                                                        +---------+---------------+---------+-----------+----------+--------------+ PFV      Full                                                        +---------+---------------+---------+-----------+----------+--------------+ POP      Full           Yes      Yes                                  +---------+---------------+---------+-----------+----------+--------------+ PTV      Full                                                        +---------+---------------+---------+-----------+----------+--------------+ PERO     Full                                                        +---------+---------------+---------+-----------+----------+--------------+     Summary: BILATERAL: - No evidence of deep vein thrombosis seen in the lower extremities, bilaterally. -No evidence of popliteal cyst, bilaterally.   *See table(s) above for measurements and observations. Electronically signed by Harold Barban MD on 04/10/2022 at 11:28:37 PM.  Final    [place] EKG SITE RITE  Result Date: 04/09/2022 If Site Rite image not attached, placement could not be confirmed due to current cardiac rhythm.   CARDIAC DATABASE: EKG: 04/08/2022: Probable slow A-fib with a ventricular rate 48 bpm over accuracy limited due to baseline artifact, without underlying ischemia or injury pattern   Echocardiogram: 04/09/2022:  1. Left ventricular ejection fraction, by estimation, is 60 to 65%. The  left ventricle has normal function. The left ventricle has no regional  wall motion abnormalities. Left ventricular diastolic parameters are  indeterminate.   2. Right ventricular systolic function is normal. The right ventricular  size is normal. There is normal pulmonary artery systolic pressure. The  estimated right ventricular systolic pressure is 32.2 mmHg.   3. Left atrial size was mild to moderately dilated.   4. Right atrial size was mild to moderately dilated.   5. The mitral valve is normal in structure. Mild mitral valve  regurgitation. No evidence of mitral stenosis.   6. The aortic valve is grossly normal. There is mild calcification of the  aortic valve. There is mild thickening of the aortic valve. Aortic valve  regurgitation is trivial. Aortic valve sclerosis is present, with no  evidence of aortic  valve stenosis.   7. The inferior vena cava is normal in size with greater than 50%  respiratory variability, suggesting right atrial pressure of 3 mmHg.   Comparison(s): No significant change from prior study.   Conclusion(s)/Recommendation(s): Otherwise normal echocardiogram, with  minor abnormalities described in the report.    Calcium Score:  04/11/2018 Coronary calcium score of 16. This was 36 rd percentile for age and sex matched control.  Scheduled Meds:  amiodarone  200 mg Oral Daily   apixaban  2.5 mg Oral BID   bumetanide  0.5 mg Oral BID   Chlorhexidine Gluconate Cloth  6 each Topical Daily   feeding supplement  237 mL Oral BID BM   leptospermum manuka honey  1 Application Topical Daily   memantine  5 mg Oral BID   midodrine  10 mg Oral TID WC   sodium chloride flush  10 mL Intrapleural Q8H   vancomycin  125 mg Oral BID    Continuous Infusions:  sodium chloride Stopped (04/11/22 1414)   piperacillin-tazobactam (ZOSYN)  IV 12.5 mL/hr at 04/11/22 1600    PRN Meds: sodium chloride, oxyCODONE   IMPRESSION & RECOMMENDATIONS: Julie Montgomery is a 86 y.o. Caucasian female whose past medical history and cardiac risk factors include: History of recurrent C. difficile colitis with recent history of abdominal perforation with subtotal colectomy and end ileostomy (02/2022) baseline dementia, paroxysmal atrial fibrillation, chronic hyponatremia, Mild coronary artery calcification, Aortic Atherosclerosis (CT 03/2019), basal cell carcinoma, bronchiectasis, history of TIA.  Impression:  Paroxysmal atrial fibrillation. Long-term oral anticoagulation. Long-term antiarrhythmic medications. Lower extremity swelling -likely secondary to low oncotic pressure due to poor nutrition status. Elevated BNP HCAP presenting with sepsis Bilateral pleural effusion status post right-sided thoracentesis Chronic hyponatremia. History of toxic megacolon due to recurrent C. difficile colitis  infection status post ileostomy Mild coronary calcification. Aortic atherosclerosis. History of TIA   Plan:  Paroxysmal atrial fibrillation: Rate: diltiazem held due to hypotension, agree for now.  Rhythm: Amiodarone 200 mg po qday.  Thromboembolic prophylaxis: Eliquis  Remain in sinus for now.  Continue telemetry   Lower extremity swelling / elevated BNP:  Likely secondary to cachexia /poor nutritional status leading to low oncotic pressures.  Recommend ACE wraps / compressions  to facilitate fluid shifts  Continue low dose Bumex  DVT study negative Net IO Since Admission: -3,274.94 mL [04/11/22 1638]  Sepsis 2/2 HCAP w/ recurrent pleural effusion and ?aspiration:  Needed dopamine during the current admission.  On IV AxB per CCM.  Initial thoracentesis fluid was not sent for analysis.  Being re-evaluate for either repeat thoracentesis vs. Pleural catheter placement  Managed by PCM.   Recurrent C.diff infections led to toxic megacolon now s/p right sided end ileostomy:  Management per primary team.   From cardiac stand point no CP or HF symptoms. Responding to Bumex for now w/ net negative fluid balance. Remain in sinus rhythm for now. Once BP improves recommend restarting CCB short acting w/ holding parameter to decrease the probability of RVR.   For now will sign off please call if questions arise.   [name] questions and concerns were addressed to her and her husband's satisfaction. They both voice understanding.  This note was created using a voice recognition software as a result there may be grammatical errors inadvertently enclosed that do not reflect the nature of this encounter. Every attempt is made to correct such errors.  Mechele Claude [facility]  Pager: [phone] Office: [phone] 04/11/2022, 4:38 PM

## 2022-04-11 NOTE — Evaluation (Signed)
Physical Therapy Evaluation Patient Details Name: Julie Montgomery MRN: [id number] DOB: February 23, 1936 Today's Date: 04/11/2022  History of Present Illness  86 year old woman who presented to [facility] ED 12/10 with SOB, body aches, nausea and lethargy with FTT. Transferred to [facility] with sepsis, PNA and UTI.  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 She has been hospitalized 5 times in 2023. Most recent, patient was discharged to [facility] 12/1.  Clinical Impression  Pt admitted with above diagnosis. Pt was able to ambulate a few steps forward and back with min assist of 2 with limitations due to endurance. Pt has 24 hour caregiver and plan is to go home. Has equipment as well. Would benefit from [facility] however understand that pt may not have coverage due to Hospice. Will follow acutely.   Pt currently with functional limitations due to the deficits listed below (see PT Problem List). Pt will benefit from skilled PT to increase their independence and safety with mobility to allow discharge to the venue listed below.          Recommendations for follow up therapy are one component of a multi-disciplinary discharge planning process, led by the attending physician.  Recommendations may be updated based on patient status, additional functional criteria and insurance authorization.  Follow Up Recommendations Home health PT (after continued work with acute PT)      Assistance Recommended at Discharge Frequent or constant Supervision/Assistance  Patient can return home with the following  A little help with bathing/dressing/bathroom;Assistance with cooking/housework;Direct supervision/assist for medications management;Direct supervision/assist for financial management;Assist for transportation;Help with stairs or ramp for  entrance;Assistance with feeding;A little help with walking and/or transfers    Equipment Recommendations None recommended by PT  Recommendations for Other Services       Functional Status Assessment Patient has had a recent decline in their functional status and demonstrates the ability to make significant improvements in function in a reasonable and predictable amount of time.     Precautions / Restrictions Precautions Precautions: Fall;Other (comment) Precaution Comments: ostomy, bil foot wounds Restrictions Weight Bearing Restrictions: No      Mobility  Bed Mobility Overal bed mobility: Needs Assistance Bed Mobility: Rolling, Sidelying to Sit Rolling: Mod assist Sidelying to sit: Mod assist, +2 for physical assistance       General bed mobility comments: Needed assist to come to EOB as well as cues.  Did initiate movement but needed assist to complete    Transfers Overall transfer level: Needs assistance   Transfers: Sit to/from Stand, Bed to chair/wheelchair/BSC Sit to Stand: Mod assist, +2 physical assistance, From elevated surface   Step pivot transfers: Min assist, +2 physical assistance       General transfer comment: pt pulling up on PT and OT hands with mod assist to power up.  Once up, min assist to step pivot to chair with bil UE support.    Ambulation/Gait Ambulation/Gait assistance: Min assist, +2 safety/equipment Gait Distance (Feet): 9 Feet (3 steps forward and back x 3) Assistive device: 2 person hand held assist Gait Pattern/deviations: Step-through pattern, Decreased stride length, Trunk flexed, Narrow base of support, Shuffle Gait velocity: decreased     General Gait Details: Pt needs bil UE support and slight stteadying assist with taking a few steps back and forth.  Stairs            Emergency planning/management officer  Modified Rankin (Stroke Patients Only)       Balance Overall balance assessment: Needs assistance Sitting-balance  support: No upper extremity supported, Feet supported, Bilateral upper extremity supported Sitting balance-Leahy Scale: Fair Sitting balance - Comments: fair sitting balance with and without UE support   Standing balance support: Bilateral upper extremity supported Standing balance-Leahy Scale: Poor Standing balance comment: reliant on bil UE support and tactile cues for balance                             Pertinent Vitals/Pain Pain Assessment Pain Assessment: Faces Faces Pain Scale: Hurts even more Breathing: normal Negative Vocalization: none Facial Expression: smiling or inexpressive Body Language: tense, distressed pacing, fidgeting Consolability: distracted or reassured by voice/touch PAINAD Score: 2 Pain Location: bottom/sacrum Pain Descriptors / Indicators: Discomfort, Guarding, Sore Pain Intervention(s): Limited activity within patient's tolerance, Monitored during session, Repositioned    Home Living Family/patient expects to be discharged to:: Private residence Living Arrangements: Spouse/significant other Available Help at Discharge: Family;Personal care attendant;Available 24 hours/day (Son comes in morning to help pt up, breakfast, etc; other son comes in afternoon to check; then has aide from 3-8pm to assist with dinner and nighttime routine) Type of Home: House Home Access: Stairs to enter   [employer] of Steps: 2   Home Layout: One level Home Equipment: Conservation officer, nature (2 wheels);Wheelchair - Teaching laboratory technician Comments: son provided PLOF    Prior Function Prior Level of Function : Needs assist             Mobility Comments: Pt recently in SNF and prior to that she was ambulating in home with RW and supervision/min guard ADLs Comments: Pt recently hospitalized and in SNF and since that time light min A for ADLs     Hand Dominance   Dominant Hand: Right    Extremity/Trunk Assessment   Upper Extremity  Assessment Upper Extremity Assessment: Defer to OT evaluation    Lower Extremity Assessment Lower Extremity Assessment: Generalized weakness    Cervical / Trunk Assessment Cervical / Trunk Assessment: Normal  Communication   Communication: HOH  Cognition Arousal/Alertness: Awake/alert Behavior During Therapy: Anxious Overall Cognitive Status: History of cognitive impairments - at baseline Area of Impairment: Memory, Problem solving, Awareness, Following commands, Safety/judgement, Attention                 Orientation Level: Disoriented to, Time Current Attention Level: Sustained Memory: Decreased short-term memory Following Commands: Follows one step commands with increased time, Follows one step commands inconsistently Safety/Judgement: Decreased awareness of deficits, Decreased awareness of safety Awareness: Intellectual Problem Solving: Slow processing, Difficulty sequencing, Requires verbal cues, Decreased initiation General Comments: cognitive impairments at baseline, pt with STM challenges (repeats herself); perseverating on eating the rest of breakfast and kept asking about it even though PT and OT had told pt we were getting her up to finish eating.        General Comments General comments (skin integrity, edema, etc.): VSS on RA    Exercises General Exercises - Lower Extremity Ankle Circles/Pumps: AROM, Both, 5 reps Long Arc Quad: AROM, Both, 10 reps Heel Slides: AAROM, Both, 10 reps Other Exercises Other Exercises: BUE horizontal abduction x10   Assessment/Plan    PT Assessment Patient needs continued PT services  PT Problem List Decreased strength;Decreased activity tolerance;Decreased balance;Decreased range of motion;Decreased mobility;Decreased coordination;Decreased cognition;Decreased knowledge of use of DME;Pain       PT Treatment Interventions DME  instruction;Gait training;Stair training;Functional mobility training;Therapeutic  activities;Therapeutic exercise;Balance training;Neuromuscular re-education;Patient/family education    PT Goals (Current goals can be found in the Care Plan section)  Acute Rehab PT Goals Patient Stated Goal: to go home PT Goal Formulation: With patient/family Time For Goal Achievement: 04/25/22 Potential to Achieve Goals: Fair    Frequency Min 3X/week     Co-evaluation PT/OT/SLP Co-Evaluation/Treatment: Yes Reason for Co-Treatment: Complexity of the patient's impairments (multi-system involvement);For patient/therapist safety PT goals addressed during session: Mobility/safety with mobility         AM-PAC PT "6 Clicks" Mobility  Outcome Measure Help needed turning from your back to your side while in a flat bed without using bedrails?: A Little Help needed moving from lying on your back to sitting on the side of a flat bed without using bedrails?: A Lot Help needed moving to and from a bed to a chair (including a wheelchair)?: Total Help needed standing up from a chair using your arms (e.g., wheelchair or bedside chair)?: A Little Help needed to walk in hospital room?: A Little Help needed climbing 3-5 steps with a railing? : A Lot 6 Click Score: 14    End of Session Equipment Utilized During Treatment: Gait belt;Oxygen Activity Tolerance: Patient limited by fatigue Patient left: in chair;with call bell/phone within reach;with chair alarm set Nurse Communication: Mobility status PT Visit Diagnosis: Unsteadiness on feet (R26.81);Muscle weakness (generalized) (M62.81);Difficulty in walking, not elsewhere classified (R26.2);Pain Pain - part of body:  (sacrum)    Time: 1448-1856 PT Time Calculation (min) (ACUTE ONLY): 20 min   Charges:   PT Evaluation $PT Eval Moderate Complexity: 1 Mod          Zalia Hautala M,PT Acute Rehab Services [phone]   Alvira Philips 04/11/2022, 12:15 PM

## 2022-04-12 ENCOUNTER — Inpatient Hospital Stay (HOSPITAL_COMMUNITY): Payer: Medicare Other

## 2022-04-12 DIAGNOSIS — J95811 Postprocedural pneumothorax: Secondary | ICD-10-CM

## 2022-04-12 DIAGNOSIS — Z9889 Other specified postprocedural states: Secondary | ICD-10-CM

## 2022-04-12 DIAGNOSIS — Z4682 Encounter for fitting and adjustment of non-vascular catheter: Secondary | ICD-10-CM

## 2022-04-12 LAB — CBC WITH DIFFERENTIAL/PLATELET
Abs Immature Granulocytes: 0.05 10*3/uL (ref 0.00–0.07)
Basophils Absolute: 0 10*3/uL (ref 0.0–0.1)
Basophils Relative: 0 %
Eosinophils Absolute: 0 10*3/uL (ref 0.0–0.5)
Eosinophils Relative: 0 %
HCT: 27.4 % — ABNORMAL LOW (ref 36.0–46.0)
Hemoglobin: 9 g/dL — ABNORMAL LOW (ref 12.0–15.0)
Immature Granulocytes: 0 %
Lymphocytes Relative: 3 %
Lymphs Abs: 0.4 10*3/uL — ABNORMAL LOW (ref 0.7–4.0)
MCH: 30.1 pg (ref 26.0–34.0)
MCHC: 32.8 g/dL (ref 30.0–36.0)
MCV: 91.6 fL (ref 80.0–100.0)
Monocytes Absolute: 1.3 10*3/uL — ABNORMAL HIGH (ref 0.1–1.0)
Monocytes Relative: 9 %
Neutro Abs: 13.4 10*3/uL — ABNORMAL HIGH (ref 1.7–7.7)
Neutrophils Relative %: 88 %
Platelets: 334 10*3/uL (ref 150–400)
RBC: 2.99 MIL/uL — ABNORMAL LOW (ref 3.87–5.11)
RDW: 16.6 % — ABNORMAL HIGH (ref 11.5–15.5)
WBC: 15.3 10*3/uL — ABNORMAL HIGH (ref 4.0–10.5)
nRBC: 0 % (ref 0.0–0.2)

## 2022-04-12 LAB — BASIC METABOLIC PANEL
Anion gap: 12 (ref 5–15)
BUN: 13 mg/dL (ref 8–23)
CO2: 25 mmol/L (ref 22–32)
Calcium: 9.3 mg/dL (ref 8.9–10.3)
Chloride: 92 mmol/L — ABNORMAL LOW (ref 98–111)
Creatinine, Ser: 0.54 mg/dL (ref 0.44–1.00)
GFR, Estimated: >60 mL/min (ref >60)
Glucose, Bld: 87 mg/dL (ref 70–99)
Potassium: 4.3 mmol/L (ref 3.5–5.1)
Sodium: 129 mmol/L — ABNORMAL LOW (ref 135–145)

## 2022-04-12 LAB — APTT
aPTT: 21 seconds — ABNORMAL LOW (ref 24–36)
aPTT: 47 seconds — ABNORMAL HIGH (ref 24–36)

## 2022-04-12 LAB — HEPARIN LEVEL (UNFRACTIONATED): Heparin Unfractionated: >1.1 IU/mL — ABNORMAL HIGH (ref 0.30–0.70)

## 2022-04-12 NOTE — Progress Notes (Signed)
ANTICOAGULATION CONSULT NOTE   Pharmacy Consult for heparin Indication: atrial fibrillation  Allergies  Allergen Reactions   Lactose Intolerance (Gi) Other (See Comments)    Intolerance    Lovastatin Other (See Comments)    Unknown reaction   Mometasone Furo-Formoterol Fum Other (See Comments)    Loss of appetite, laryngitis    Peanut-Containing Drug Products Other (See Comments)   Prozac [Fluoxetine Hcl] Other (See Comments)    Jumpy   Sulfa Antibiotics Other (See Comments)    Unknown reaction    Patient Measurements: Weight: 46.8 kg (103 lb 2.8 oz) Heparin Dosing Weight: 46.8 kg (TBW)  Vital Signs: Temp: 97.8 F (36.6 C) (12/14 0302) Temp Source: Oral (12/14 0302) BP: 104/54 (12/14 0302) Pulse Rate: 65 (12/14 0302)  Labs: Recent Labs    04/09/22 2023 04/10/22 0342 04/11/22 0307 04/12/22 0440 04/12/22 0449 04/12/22 0450  HGB 8.9* 8.3* 8.3*  --  9.0*  --   HCT 27.1* 25.1* 24.5*  --  27.4*  --   PLT 388 331 328  --  334  --   APTT  --   --   --   --   --  21*  HEPARINUNFRC  --   --   --  >1.10*  --   --   CREATININE 0.50 0.42* 0.49  --   --   --      Estimated Creatinine Clearance: 38.7 mL/min (by C-G formula based on SCr of 0.49 mg/dL).   Medical History: Past Medical History:  Diagnosis Date   Adenomatous polyp of colon 2020   Allergy    Anxiety    Basal cell carcinoma (BCC)    Cataract    Depression    Helicobacter pylori gastritis    Osteopenia    Paroxysmal atrial fibrillation (HCC)    Pneumonia    hx of 03/2008   Stroke [facility])    TIA long time ago    Medications:  Infusions:   sodium chloride Stopped (04/11/22 1414)   heparin 650 Units/hr (04/11/22 2244)   piperacillin-tazobactam (ZOSYN)  IV 3.375 g (04/11/22 2234)    Assessment: Julie Montgomery is an 86 yo female admitted on 04/08/2022 with aspiration PNA. Patient had pleural catheter inserted 12/13 and pharmacy consulted for heparin dosing for A-fib. Patient was on Eliquis prior to admission and  has been on Eliquis inpatient. Last Eliquis dose 04/11/2022 at 1045. Expect heparin levels to be falsely elevated from Eliquis dose this morning (12/13). Will use aPTT for drug monitoring until aPTT and anti-Xa correlate. Hgb 8.3 and stable, plt 328. Baseline aPTT of 33 on 04/08/2022.  12/14 AM update:  aPTT sub-therapeutic   Goal of Therapy:  Heparin level 0.3-0.7 units/ml aPTT 66-102 seconds Monitor platelets by anticoagulation protocol: Yes   Plan:  Inc heparin to 800 units/hr Re-check heparin level and aPTT in 8 hours  Narda Bonds, PharmD, [place] Pharmacist Phone: [phone]

## 2022-04-12 NOTE — Progress Notes (Signed)
Daily Progress Note   Patient Name: Julie Montgomery       Date: 04/12/2022 DOB: 30-Jun-1935  Age: 86 y.o. MRN#: [id number] Attending Physician: Jonetta Osgood, MD Primary Care Physician: Cassandria Anger, MD Admit Date: 04/08/2022  Reason for Consultation/Follow-up: Establishing goals of care  Subjective: Medical records reviewed including progress notes, labs, imaging. Patient assessed at the bedside.  She is smiling on my arrival, then falls back asleep quickly.  Her son [name] is present visiting.  Created space and opportunity for patient and family's thoughts and feelings on her current illness after their conversation with my colleague yesterday.  Assisted [name] with reaching out to RN and advocating for equipment needs for patient's ostomy.  He shares his concern that patient likely would not do well at SNF although notes are currently indicating that this is the plan in place.  I informed him that I have this concern as well and explored whether they believe home with hospice is a feasible option.  He is uncertain of whether patient's husband has been able to find out about private caregivers.  We discussed the option of proceeding with referral to [facility] of the [place] for further information about hospice at home, as hopefully they have worked alongside high-quality private duty agencies in the past.  They currently have 6 hours/day of caregivers and he anticipates needing about 8 more hours daily.  We discussed plans for repeat chest x-ray today and monitoring of patient's pneumothorax.  Reviewed patient's current as needed oxycodone orders.  Patient is having a little more difficulty breathing than earlier today.  Will also reach out to [facility]M, as patient has a missing cushion from home.   [name] shares he has no other questions or concerns at this time.  I then called patient's husband Timmothy Sours to provide updates on the above palliative support.  He did not get much rest last night, as he was called to return to the bedside shortly after arriving home due to patient's difficulty with her symptoms.  He shares that patient's other son Legrand Como was able to visit this morning and all 3 of them plan to discuss next steps this afternoon when he returns to the hospital. Timmothy Sours also just got off the phone with his pastor and feels more confident about returning home after this conversation.  He continues to feel very concerned that cardiopulmonary resuscitation would cause patient more harm and suffering than benefit and I inquired whether he was ready to update CODE STATUS.  He wants to discuss this with his sons first when he arrives in person and feels that they are on the same page now.  Offered to be present for the discussion and Timmothy Sours prefers to call this PA afterwards with the decision.  He was also agreeable with referral to hospice today.  Questions and concerns addressed. PMT will continue to support holistically.   Length of Stay: 3  Current Medications:  Physical Exam Vitals and nursing note reviewed.  Constitutional:      General: She is not in acute distress.    Appearance: She is ill-appearing.  Cardiovascular:     Rate and Rhythm: Normal rate.  Pulmonary:     Effort: Pulmonary effort is normal. No respiratory distress.  Skin:    General: Skin is warm and dry.  Neurological:     Mental Status: She is alert. She is confused.     Motor: Weakness present.  Psychiatric:        Cognition and Memory: Cognition is impaired. Memory is impaired.        Judgment: Judgment is impulsive.            Vital Signs: BP (!) 107/55 (BP Location: Left Arm)   Pulse 75   Temp 98.6 F (37 C) (Oral)   Resp 20   Wt 46.8 kg   SpO2 98%   BMI 18.87 kg/m  SpO2: SpO2: 98 % O2 Device: O2 Device:  Nasal Cannula O2 Flow Rate: O2 Flow Rate (L/min): 3 L/min      Palliative Assessment/Data: PPS 30-40%   Palliative Care Assessment & Plan   Patient Profile: 86 y.o. female  with past medical history of dementia, recurrent C. difficile infection, permanent A-fib on Eliquis, chronic hyponatremia,  failure to thrive, stroke, H. pylori, abdominal perforation with subtotal colectomy and end ileostomy (03/21/2022) during recent prolonged hospitalization 03/05/22-03/30/22  admitted on 04/08/2022 with shortness of breath and bodyaches.    Patient was initially seen at [facility] ED and transferred to [facility] upon recommendation of critical care.  She is now admitted with healthcare acquired pneumonia, sepsis, acute hypoxic respiratory failure, acute diastolic heart failure exacerbation. PMT has been consulted to assist with goals of care conversation.  Patient is familiar to our service from her last hospitalization.  Assessment: Principal Problem:   HCAP (healthcare-associated pneumonia) Active Problems:   Coronary atherosclerosis due to calcified coronary lesion   Long term current use of antiarrhythmic drug   Atherosclerosis of aorta (HCC)   Severe protein-energy malnutrition (HCC)   Septic shock (HCC)   Elevated brain natriuretic peptide (BNP) level   Loculated pleural effusion   Status post thoracentesis   Encounter for long-term (current) use of antibiotics   Acute respiratory failure with hypoxia (HCC)   Hypotension   Physical deconditioning   Concern about end of life  Recommendations/Plan: Continue full code for now, family leaning towards DNR and will call back PMT with final decision after their discussion today Continue current care for now [name] family are agreeable to referral to hospice today; discussed with Webb Silversmith, RN, [facility] hospital liaison Goal is to return home with hospice after requiring private caregivers Ongoing GOC pending clinical course PMT will continue to  follow and support holistically   Prognosis:  Poor long-term prognosis given recurrent acute illnesses/hospitalizations, chronic debility, and  failure to thrive   Discharge Planning: Home with Hospice  Care plan was discussed with patient, patient's son, patient's husband, Dr. Sloan Leiter, Webb Silversmith RN [facility] hospital liaison    MDM: High   Dorthy Cooler, [place] Palliative Medicine Team Team phone # [phone]  Thank you for allowing the Palliative Medicine Team to assist in the care of this patient. Please utilize secure chat with additional questions, if there is no response within 30 minutes please call the above phone number.  Palliative Medicine Team providers are available by phone from 7am to 7pm daily and can be reached through the team cell phone.  Should this patient require assistance outside of these hours, please call the patient's attending physician.  Portions of this note are a verbal dictation therefore any spelling and/or grammatical errors are due to the "[employer]" system interpretation.

## 2022-04-12 NOTE — Care Management Important Message (Signed)
Important Message  Patient Details  Name: Julie Montgomery MRN: [id number] Date of Birth: 1935-05-11   Medicare Important Message Given:  Yes     Orbie Pyo 04/12/2022, 11:41 AM

## 2022-04-12 NOTE — TOC Progression Note (Signed)
Transition of Care [facility]) - Progression Note    Patient Details  Name: ASMARA BACKS MRN: [id number] Date of Birth: 01-Jan-1936  Transition of Care [facility]) CM/SW [place], RN Phone Number: 04/12/2022, 4:21 PM  Clinical Narrative:     Spoke with son, [name], by phone regarding home hospice.  Offered choice and [name] deferred to [facility] to find highly rated home hospice agency.  Anderson Malta with [facility] will call [name] to discuss hospice. Patient has all needed DME except home 02.  TOC will continue to follow for needs.   Expected Discharge Plan: Home w Hospice Care Barriers to Discharge: Continued Medical Work up  Expected Discharge Plan and Services Expected Discharge Plan: [place]   Discharge Planning Services: CM Consult   Living arrangements for the past 2 months: [place] Agency: Other - See comment ([facility]) Date [facility] Contacted: 04/12/22 Time [place]: 1620 Representative spoke with at [place]: [place] ([place]) Interventions    Readmission Risk Interventions    03/08/2022    3:28 PM 02/27/2022   12:23 PM 02/12/2022    9:29 AM  Readmission Risk Prevention Plan  Transportation Screening Complete Complete Complete  PCP or Specialist Appt within 3-5 Days  Complete Complete  HRI or [place]  Complete Complete  Social Work Consult for [place] Planning/Counseling  Complete Complete  Palliative Care Screening  Not Applicable Not Applicable  Medication Review Press photographer) Complete Complete Complete  HRI or Home Care Consult Complete    [facility] Care/Counseling Consult Complete    Palliative Care Screening Complete    [place] Not Applicable

## 2022-04-12 NOTE — Progress Notes (Signed)
PROGRESS NOTE        PATIENT DETAILS Name: Julie Montgomery Age: 86 y.o. Sex: female Date of Birth: 1935/10/21 Admit Date: 04/08/2022 Admitting Physician Chesley Mires, MD UEA:[id number], Evie Lacks, MD  Brief Summary: Patient is a 86 y.o.  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  Upon stability-transferred back to [facility] service on 12/14.    Significant events: 11/06-12/01>> hospitalized patient for C. difficile infection with toxic megacolon requiring colectomy/diverting ostomy.   12/10>> admit to [facility]-severe sepsis due to PNA. 12/11>> ultrasound-guided thoracocentesis by IR. 12/11>> hypotensive-transferred to ICU for pressors. 12/13>> left-sided chest tube placed 12/14>> transferred to [facility]  Significant studies: 12/10>> CT abdomen/pelvis: Interval increase in large bilateral pleural effusions with lower lobe consolidation, subtotal colectomy/right-sided end ileostomy. 12/10>> CT angio chest: No PE, moderate bilateral pleural effusion, groundglass opacities right upper/middle/left upper lobes. 12/11>> echo: EF 60-65%  Significant microbiology data: 12/10>> COVID/influenza/RSV: Negative 12/10>>Urine culture: Enterococcus faecalis  12/10>> blood culture: No growth 12/11>> blood culture: No growth 12/11>> respiratory virus panel: Negative 12/11>> stool C. difficile: Negative 12/13>> pleural fluid culture: No growth  Procedures: 12/11>> thoracocentesis by IR 12/13>> chest tube placement.  Consults: PCCM Cardiology Palliative care  Subjective: Sitting up eating breakfast.  Very pleasantly confused.   Family at bedside.  Objective: Vitals: Blood pressure 115/62, pulse 74, temperature 98.6 F (37 C), temperature source Oral, resp. rate (!) 21, weight 46.8 kg, SpO2 98 %.   Exam: Gen Exam:not in any distress-chronically sick appearing-very frail. HEENT:atraumatic, normocephalic Chest: B/L clear to auscultation anteriorly CVS:S1S2 regular Abdomen:soft non tender, non distended Extremities:++ edema Neurology: Non focal Skin: no rash  Pertinent Labs/Radiology:    Latest Ref Rng & Units 04/12/2022    4:49 AM 04/11/2022    3:07 AM 04/10/2022    3:42 AM  CBC  WBC 4.0 - 10.5 K/uL 15.3  12.8  11.2   Hemoglobin 12.0 - 15.0 g/dL 9.0  8.3  8.3   Hematocrit 36.0 - 46.0 % 27.4  24.5  25.1   Platelets 150 - 400 K/uL 334  328  331     Lab Results  Component Value Date   NA 129 (L) 04/12/2022   K 4.3 04/12/2022   CL 92 (L) 04/12/2022   CO2 25 04/12/2022      Assessment/Plan: Septic shock due to aspiration PNA and complicated UTI Sepsis physiology improved On day 4/5 Zosyn BP stable but on midodrine.  Acute hypoxic respiratory failure due to probable aspiration pneumonia and large bilateral pleural effusion Currently stable on 2-3 L of oxygen Antibiotics as above-chest tube in place for pleural effusion Wean down oxygen as tolerated  Bilateral pleural effusion Loculated left pleural effusion-s/p pigtail catheter placement on 12/13 Iatrogenic apical pneumothorax following chest tube placement PCCM following-defer chest tube management to PCCM service.  Chronic atrial fibrillation Continue amiodarone/IV heparin Monitor in telemetry.  History of recurrent C. difficile infection-with toxic megacolon requiring colectomy/ostomy 11/22 C. difficile studies negative Minimize antibiotics On oral vancomycin  Hyponatremia Mild Due to hypervolemia is volume overloaded Continue Bumex  Dementia Pleasantly confused Maintain delirium precautions Continue  Namenda  Anasarca Likely due to hypoalbuminemia Doubt CHF, UA negative for protein. Optimize nutrition status is much as possible Gentle diuretics  Palliative care Severe failure to thrive syndrome Multiple comorbid issues as outlined above-very frail/cachectic Goals of care conversations ongoing (Long discussion with son/spouse at bedside this morning) Family understands tenuous clinical condition-potential to deteriorate further Remains a full code for now   Pressure Ulcer: Pressure Injury 03/09/22 Sacrum Mid Unstageable - Full thickness tissue loss in which the base of the injury is covered by slough (yellow, tan, gray, green or brown) and/or eschar (tan, brown or black) in the wound bed. (Active)  03/09/22 2000  Location: Sacrum  Location Orientation: Mid  Staging: Unstageable - Full thickness tissue loss in which the base of the injury is covered by slough (yellow, tan, gray, green or brown) and/or eschar (tan, brown or black) in the wound bed.  Wound Description (Comments):   Present on Admission: Yes  Dressing Type Foam - Lift dressing to assess site every shift 04/12/22 0617     Pressure Injury 03/29/22 Heel Right Deep Tissue Pressure Injury - Purple or maroon localized area of discolored intact skin or blood-filled blister due to damage of underlying soft tissue from pressure and/or shear. (Active)  03/29/22 2003  Location: Heel  Location Orientation: Right  Staging: Deep Tissue Pressure Injury - Purple or maroon localized area of discolored intact skin or blood-filled blister due to damage of underlying soft tissue from pressure and/or shear.  Wound Description (Comments):   Present on Admission:   Dressing Type Foam - Lift dressing to assess site every shift 04/12/22 0617     Pressure Injury 04/09/22 Heel Left Deep Tissue Pressure Injury - Purple or maroon localized area of discolored intact skin or blood-filled blister due to damage of underlying soft tissue from  pressure and/or shear. (Active)  04/09/22 1836  Location: Heel  Location Orientation: Left  Staging: Deep Tissue Pressure Injury - Purple or maroon localized area of discolored intact skin or blood-filled blister due to damage of underlying soft tissue from pressure and/or shear.  Wound Description (Comments):   Present on Admission: Yes  Dressing Type Foam - Lift dressing to assess site every shift 04/12/22 0617   Underweight: Estimated body mass index is 18.87 kg/m as calculated from the following:   Height as of 03/05/22: '5\' 2"'$  (1.575 m).   Weight as of this encounter: 46.8 kg.   Code status:   Code Status: Full Code   DVT Prophylaxis: IV heparin   Family Communication: None at bedside   Disposition Plan: Status is: Inpatient Remains inpatient appropriate because: Severity of illness   Planned Discharge Destination:Skilled nursing facility-not yet medically stable.   Diet: Diet Order             Diet regular Room service appropriate? Yes; Fluid consistency: Thin  Diet effective now                     Antimicrobial agents: Anti-infectives (From admission, onward)    Start     Dose/Rate Route Frequency Ordered Stop   04/10/22 1900  vancomycin (VANCOCIN) IVPB 1000 mg/200 mL premix  Status:  Discontinued        1,000 mg 200 mL/hr over 60 Minutes Intravenous Every 36 hours 04/09/22 0756 04/10/22 0839   04/10/22 0930  piperacillin-tazobactam (ZOSYN) IVPB 3.375 g        3.375 g 12.5 mL/hr over 240 Minutes Intravenous Every 8 hours 04/10/22 0843 04/13/22  2359   04/09/22 1800  azithromycin (ZITHROMAX) 500 mg in sodium chloride 0.9 % 250 mL IVPB  Status:  Discontinued        500 mg 250 mL/hr over 60 Minutes Intravenous Every 24 hours 04/09/22 0401 04/10/22 0839   04/09/22 1000  ceFEPIme (MAXIPIME) 2 g in sodium chloride 0.9 % 100 mL IVPB  Status:  Discontinued       Note to Pharmacy: Pharmacy to dose   2 g 200 mL/hr over 30 Minutes Intravenous 2 times daily  04/09/22 0401 04/10/22 0839   04/09/22 1000  vancomycin (VANCOCIN) capsule 125 mg        125 mg Oral 2 times daily 04/09/22 0401 04/18/22 2359   04/09/22 0600  ceFEPIme (MAXIPIME) 2 g in sodium chloride 0.9 % 100 mL IVPB  Status:  Discontinued        2 g 200 mL/hr over 30 Minutes Intravenous Every 8 hours 04/09/22 0401 04/09/22 0405   04/09/22 0430  vancomycin (VANCOCIN) IVPB 1000 mg/200 mL premix        1,000 mg 200 mL/hr over 60 Minutes Intravenous  Once 04/09/22 0424 04/09/22 0725   04/08/22 2315  ceFEPIme (MAXIPIME) 2 g in sodium chloride 0.9 % 100 mL IVPB        2 g 200 mL/hr over 30 Minutes Intravenous  Once 04/08/22 2309 04/08/22 2352   04/08/22 1945  cefTRIAXone (ROCEPHIN) 1 g in sodium chloride 0.9 % 100 mL IVPB  Status:  Discontinued        1 g 200 mL/hr over 30 Minutes Intravenous  Once 04/08/22 1937 04/08/22 2121   04/08/22 1945  azithromycin (ZITHROMAX) 500 mg in sodium chloride 0.9 % 250 mL IVPB  Status:  Discontinued        500 mg 250 mL/hr over 60 Minutes Intravenous  Once 04/08/22 1937 04/08/22 2121        MEDICATIONS: Scheduled Meds:  amiodarone  200 mg Oral Daily   bumetanide  0.5 mg Oral BID   Chlorhexidine Gluconate Cloth  6 each Topical Daily   feeding supplement  237 mL Oral BID BM   leptospermum manuka honey  1 Application Topical Daily   memantine  5 mg Oral BID   midodrine  10 mg Oral TID WC   sodium chloride flush  10 mL Intrapleural Q8H   vancomycin  125 mg Oral BID   Continuous Infusions:  sodium chloride Stopped (04/11/22 1414)   heparin 800 Units/hr (04/12/22 0639)   piperacillin-tazobactam (ZOSYN)  IV 12.5 mL/hr at 04/12/22 0629   PRN Meds:.sodium chloride, oxyCODONE   I have personally reviewed following labs and imaging studies  LABORATORY DATA: CBC: Recent Labs  Lab 04/08/22 1825 04/09/22 0322 04/09/22 0430 04/09/22 2023 04/10/22 0342 04/11/22 0307 04/12/22 0449  WBC 12.0*  --   --  14.2* 11.2* 12.8* 15.3*  NEUTROABS 9.8*  --    --  12.3* 9.5* 10.8* 13.4*  HGB 9.5*   < > 7.8* 8.9* 8.3* 8.3* 9.0*  HCT 29.6*   < > 24.9* 27.1* 25.1* 24.5* 27.4*  MCV 91.1  --   --  91.6 91.6 89.4 91.6  PLT 531*  --   --  388 331 328 334   < > = values in this interval not displayed.    Basic Metabolic Panel: Recent Labs  Lab 04/08/22 1825 04/09/22 0322 04/09/22 2023 04/10/22 0342 04/11/22 0307 04/12/22 0449  NA 131* 133* 134* 135 130* 129*  K 5.7* 4.6 4.1  3.7 3.4* 4.3  CL 98  --  101 100 95* 92*  CO2 24  --  '25 27 27 25  '$ GLUCOSE 84  --  100* 89 106* 87  BUN 27*  --  '15 11 14 13  '$ CREATININE 0.48  --  0.50 0.42* 0.49 0.54  CALCIUM 10.2  --  9.2 8.8* 8.9 9.3    GFR: Estimated Creatinine Clearance: 38.7 mL/min (by C-G formula based on SCr of 0.54 mg/dL).  Liver Function Tests: Recent Labs  Lab 04/08/22 1825  AST 43*  ALT 50*  ALKPHOS 176*  BILITOT 0.3  PROT 7.0  ALBUMIN 3.8   Recent Labs  Lab 04/08/22 1825  LIPASE 30   No results for input(s): "AMMONIA" in the last 168 hours.  Coagulation Profile: Recent Labs  Lab 04/08/22 1825  INR 1.1    Cardiac Enzymes: No results for input(s): "CKTOTAL", "CKMB", "CKMBINDEX", "TROPONINI" in the last 168 hours.  BNP (last 3 results) Recent Labs    01/26/22 1447  PROBNP 68.0    Lipid Profile: No results for input(s): "CHOL", "HDL", "LDLCALC", "TRIG", "CHOLHDL", "LDLDIRECT" in the last 72 hours.  Thyroid Function Tests: No results for input(s): "TSH", "T4TOTAL", "FREET4", "T3FREE", "THYROIDAB" in the last 72 hours.  Anemia Panel: No results for input(s): "VITAMINB12", "FOLATE", "FERRITIN", "TIBC", "IRON", "RETICCTPCT" in the last 72 hours.  Urine analysis:    Component Value Date/Time   COLORURINE STRAW (A) 04/09/2022 0430   APPEARANCEUR CLEAR 04/09/2022 0430   LABSPEC 1.025 04/09/2022 0430   PHURINE 5.0 04/09/2022 0430   GLUCOSEU NEGATIVE 04/09/2022 0430   GLUCOSEU NEGATIVE 04/05/2021 1236   HGBUR NEGATIVE 04/09/2022 0430   BILIRUBINUR NEGATIVE  04/09/2022 0430   KETONESUR NEGATIVE 04/09/2022 0430   PROTEINUR NEGATIVE 04/09/2022 0430   UROBILINOGEN 0.2 04/05/2021 1236   NITRITE NEGATIVE 04/09/2022 0430   LEUKOCYTESUR NEGATIVE 04/09/2022 0430    Sepsis Labs: Lactic Acid, Venous    Component Value Date/Time   LATICACIDVEN 0.9 04/09/2022 2000    MICROBIOLOGY: Recent Results (from the past 240 hour(s))  Resp panel by RT-PCR (RSV, Flu A&B, Covid) Anterior Nasal Swab     Status: None   Collection Time: 04/08/22  6:05 PM   Specimen: Anterior Nasal Swab  Result Value Ref Range Status   SARS Coronavirus 2 by RT PCR NEGATIVE NEGATIVE Final    Comment: (NOTE) SARS-CoV-2 target nucleic acids are NOT DETECTED.  The SARS-CoV-2 RNA is generally detectable in upper respiratory specimens during the acute phase of infection. The lowest concentration of SARS-CoV-2 viral copies this assay can detect is 138 copies/mL. A negative result does not preclude SARS-Cov-2 infection and should not be used as the sole basis for treatment or other patient management decisions. A negative result may occur with  improper specimen collection/handling, submission of specimen other than nasopharyngeal swab, presence of viral mutation(s) within the areas targeted by this assay, and inadequate number of viral copies(<138 copies/mL). A negative result must be combined with clinical observations, patient history, and epidemiological information. The expected result is Negative.  Fact Sheet for Patients:  EntrepreneurPulse.com.au  Fact Sheet for Healthcare Providers:  IncredibleEmployment.be  This test is no t yet approved or cleared by the [place] FDA and  has been authorized for detection and/or diagnosis of SARS-CoV-2 by FDA under an Emergency Use Authorization (EUA). This EUA will remain  in effect (meaning this test can be used) for the duration of the COVID-19 declaration under Section 564(b)(1) of the  Act, 21  U.S.C.section 360bbb-3(b)(1), unless the authorization is terminated  or revoked sooner.       Influenza A by PCR NEGATIVE NEGATIVE Final   Influenza B by PCR NEGATIVE NEGATIVE Final    Comment: (NOTE) The Xpert Xpress SARS-CoV-2/FLU/RSV plus assay is intended as an aid in the diagnosis of influenza from Nasopharyngeal swab specimens and should not be used as a sole basis for treatment. Nasal washings and aspirates are unacceptable for Xpert Xpress SARS-CoV-2/FLU/RSV testing.  Fact Sheet for Patients: EntrepreneurPulse.com.au  Fact Sheet for Healthcare Providers: IncredibleEmployment.be  This test is not yet approved or cleared by the [place] FDA and has been authorized for detection and/or diagnosis of SARS-CoV-2 by FDA under an Emergency Use Authorization (EUA). This EUA will remain in effect (meaning this test can be used) for the duration of the COVID-19 declaration under Section 564(b)(1) of the Act, 21 U.S.C. section 360bbb-3(b)(1), unless the authorization is terminated or revoked.     Resp Syncytial Virus by PCR NEGATIVE NEGATIVE Final    Comment: (NOTE) Fact Sheet for Patients: EntrepreneurPulse.com.au  Fact Sheet for Healthcare Providers: IncredibleEmployment.be  This test is not yet approved or cleared by the [place] FDA and has been authorized for detection and/or diagnosis of SARS-CoV-2 by FDA under an Emergency Use Authorization (EUA). This EUA will remain in effect (meaning this test can be used) for the duration of the COVID-19 declaration under Section 564(b)(1) of the Act, 21 U.S.C. section 360bbb-3(b)(1), unless the authorization is terminated or revoked.  Performed at [facility], [address], [place], [place] [address]   Blood Culture (routine x 2)     Status: None (Preliminary result)   Collection Time: 04/08/22  6:25 PM   Specimen:  BLOOD  Result Value Ref Range Status   Specimen Description   Final    BLOOD RIGHT ANTECUBITAL Performed at [facility], [address] [place], [place] [address]    Special Requests   Final    BOTTLES DRAWN AEROBIC AND ANAEROBIC Blood Culture adequate volume Performed at [facility], [address], [place], [place] [address]    Culture   Final    NO GROWTH 2 DAYS Performed at [facility], [place] [address], [place], [place] [address]    Report Status PENDING  Incomplete  Blood Culture (routine x 2)     Status: None (Preliminary result)   Collection Time: 04/08/22  6:25 PM   Specimen: BLOOD  Result Value Ref Range Status   Specimen Description   Final    BLOOD LEFT ANTECUBITAL Performed at [facility], [address], [place], [place] [address]    Special Requests   Final    BOTTLES DRAWN AEROBIC AND ANAEROBIC Blood Culture adequate volume Performed at [facility], [address], [place], [place] [address]    Culture   Final    NO GROWTH 2 DAYS Performed at [facility], [place] [address], [place], [place] [address]    Report Status PENDING  Incomplete  Urine Culture     Status: Abnormal   Collection Time: 04/08/22 10:24 PM   Specimen: In/Out Cath Urine  Result Value Ref Range Status   Specimen Description   Final    IN/OUT CATH URINE Performed at [facility], [address], [place], [place] [address]    Special Requests   Final    NONE Performed at [facility], [address] [place],  [address]  Culture 80,000 COLONIES/mL ENTEROCOCCUS FAECALIS (A)  Final   Report Status 04/11/2022 FINAL  Final   Organism ID, Bacteria ENTEROCOCCUS FAECALIS (A)  Final      Susceptibility   Enterococcus faecalis - MIC*    AMPICILLIN <=2 SENSITIVE Sensitive     NITROFURANTOIN <=16 SENSITIVE Sensitive     VANCOMYCIN 1 SENSITIVE Sensitive      * 80,000 COLONIES/mL ENTEROCOCCUS FAECALIS  Respiratory (~20 pathogens) panel by PCR     Status: None   Collection Time: 04/09/22  4:30 AM   Specimen: Nasopharyngeal Swab; Respiratory  Result Value Ref Range Status   Adenovirus NOT DETECTED NOT DETECTED Final   Coronavirus 229E NOT DETECTED NOT DETECTED Final    Comment: (NOTE) The Coronavirus on the Respiratory Panel, DOES NOT test for the novel  Coronavirus (2019 nCoV)    Coronavirus HKU1 NOT DETECTED NOT DETECTED Final   Coronavirus NL63 NOT DETECTED NOT DETECTED Final   Coronavirus OC43 NOT DETECTED NOT DETECTED Final   Metapneumovirus NOT DETECTED NOT DETECTED Final   Rhinovirus / Enterovirus NOT DETECTED NOT DETECTED Final   Influenza A NOT DETECTED NOT DETECTED Final   Influenza B NOT DETECTED NOT DETECTED Final   Parainfluenza Virus 1 NOT DETECTED NOT DETECTED Final   Parainfluenza Virus 2 NOT DETECTED NOT DETECTED Final   Parainfluenza Virus 3 NOT DETECTED NOT DETECTED Final   Parainfluenza Virus 4 NOT DETECTED NOT DETECTED Final   Respiratory Syncytial Virus NOT DETECTED NOT DETECTED Final   Bordetella pertussis NOT DETECTED NOT DETECTED Final   Bordetella Parapertussis NOT DETECTED NOT DETECTED Final   Chlamydophila pneumoniae NOT DETECTED NOT DETECTED Final   Mycoplasma pneumoniae NOT DETECTED NOT DETECTED Final    Comment: Performed at [facility], [place]. [address], [place], [place] [address]  Culture, blood (Routine X 2) w Reflex to ID Panel     Status: None (Preliminary result)   Collection Time: 04/09/22  4:30 AM   Specimen: BLOOD RIGHT FOREARM  Result Value Ref Range Status   Specimen Description BLOOD RIGHT FOREARM  Final   Special Requests   Final    BOTTLES DRAWN AEROBIC AND ANAEROBIC Blood Culture results may not be optimal due to an inadequate volume of blood received in culture bottles   Culture   Final    NO GROWTH 2 DAYS Performed at [facility], [place] [address] [place], [place] [address]     Report Status PENDING  Incomplete  Culture, blood (Routine X 2) w Reflex to ID Panel     Status: None (Preliminary result)   Collection Time: 04/09/22  4:35 AM   Specimen: BLOOD  Result Value Ref Range Status   Specimen Description BLOOD LEFT ANTECUBITAL  Final   Special Requests   Final    BOTTLES DRAWN AEROBIC AND ANAEROBIC Blood Culture adequate volume   Culture   Final    NO GROWTH 2 DAYS Performed at [facility], [place] [address], [place], [place] [address]    Report Status PENDING  Incomplete  C Difficile Quick Screen w PCR reflex     Status: None   Collection Time: 04/09/22  6:14 PM  Result Value Ref Range Status   C Diff antigen NEGATIVE NEGATIVE Final   C Diff toxin NEGATIVE NEGATIVE Final   C Diff interpretation No C. difficile detected.  Final    Comment: Performed at [facility], [place] [address], [place], [place] [address]  MRSA Next Gen  by PCR, Nasal     Status: None   Collection Time: 04/09/22  6:16 PM   Specimen: Nasal Mucosa; Nasal Swab  Result Value Ref Range Status   MRSA by PCR Next Gen NOT DETECTED NOT DETECTED Final    Comment: (NOTE) The GeneXpert MRSA Assay (FDA approved for NASAL specimens only), is one component of a comprehensive MRSA colonization surveillance program. It is not intended to diagnose MRSA infection nor to guide or monitor treatment for MRSA infections. Test performance is not FDA approved in patients less than 43 years old. Performed at [facility], [place] [address], [place], [place] [address]   Body fluid culture w Gram Stain     Status: None (Preliminary result)   Collection Time: 04/11/22  2:08 PM   Specimen: Thoracentesis; Body Fluid  Result Value Ref Range Status   Specimen Description THORACENTESIS  Final   Special Requests NONE  Final   Gram Stain NO WBC SEEN NO ORGANISMS SEEN   Final   Culture   Final    NO GROWTH < 24 HOURS Performed at [facility], [address], [place], [place] [address]     Report Status PENDING  Incomplete    RADIOLOGY STUDIES/RESULTS: DG Chest Port 1 View  Result Date: 04/12/2022 CLINICAL DATA:  Follow-up pleural effusions and pneumothorax. EXAM: PORTABLE CHEST 1 VIEW COMPARISON:  04/11/2022 FINDINGS: A left pleural drain remains in place. A small approximately 5-10% left apical pneumothorax shows no significant change. Small pleural effusions and bibasilar atelectasis or infiltrates show no significant change. Bilateral upper lobe airspace disease also shows no significant change. Cardiomegaly remains stable. IMPRESSION: No significant change in small approximately 5-10% left apical pneumothorax, small bilateral pleural effusions and bibasilar atelectasis versus infiltrates. Stable bilateral upper lobe airspace disease. Electronically Signed   By: Marlaine Hind M.D.   On: 04/12/2022 08:17   DG Chest Port 1 View  Result Date: 04/11/2022 CLINICAL DATA:  Reason for exam: S/P thoracostomy tube placement EXAM: PORTABLE CHEST - 1 VIEW COMPARISON:  Earlier film of the same day FINDINGS: Pigtail left chest tube has been placed laterally at the left lung base. Significant interval decrease in size of pleural effusion. Trace apical pneumothorax. Coarse suprahilar airspace opacities appear slightly increased. Heart size and mediastinal contours are within normal limits. Aortic Atherosclerosis (ICD10-170.0). Persistent small right pleural effusion with atelectasis/consolidation in the right lung base. Visualized bones unremarkable. IMPRESSION: 1. Left chest tube placement with significant decrease in size of pleural effusion. 2. Trace left apical pneumothorax. Electronically Signed   By: Lucrezia Europe M.D.   On: 04/11/2022 15:02   DG CHEST PORT 1 VIEW  Result Date: 04/11/2022 CLINICAL DATA:  Pleural effusion EXAM: PORTABLE CHEST 1 VIEW COMPARISON:  Previous studies including the examination of 04/09/2022 FINDINGS: Small to moderate bilateral pleural effusions are seen. There is  interval increase in amount of right pleural effusion. There is no pneumothorax. There are patchy infiltrates in both upper lung fields, more so on the right side with interval improvement in the left upper lobe infiltrate. Infiltrates are seen in both lower lung fields suggesting atelectasis/pneumonia. There is no pneumothorax. Degenerative changes are noted in right shoulder. IMPRESSION: Small to moderate bilateral pleural effusions. There is interval increase in amount of right pleural effusion. There are patchy infiltrates in both lungs suggesting possible multifocal pneumonia with slight improvement in the left upper lobe infiltrate. Electronically Signed   By: Elmer Picker M.D.   On: 04/11/2022 13:13  VAS [place] LOWER EXTREMITY VENOUS (DVT)  Result Date: 04/10/2022  Lower Venous DVT Study Patient Name:  BRAYLI KLINGBEIL  Date of Exam:   04/10/2022 Medical Rec #: [id number]        Accession #:    [id number] Date of Birth: 13-Jun-1935       Patient Gender: F Patient Age:   39 years Exam Location:  [facility] Procedure:      VAS [place] LOWER EXTREMITY VENOUS (DVT) Referring Phys: Rex Kras --------------------------------------------------------------------------------  Indications: Swelling, and Edema.  Performing Technologist: Archie Patten RVS  Examination Guidelines: A complete evaluation includes B-mode imaging, spectral Doppler, color Doppler, and power Doppler as needed of all accessible portions of each vessel. Bilateral testing is considered an integral part of a complete examination. Limited examinations for reoccurring indications may be performed as noted. The reflux portion of the exam is performed with the patient in reverse Trendelenburg.  +---------+---------------+---------+-----------+----------+--------------+ RIGHT    CompressibilityPhasicitySpontaneityPropertiesThrombus Aging +---------+---------------+---------+-----------+----------+--------------+ CFV      Full            Yes      Yes                                 +---------+---------------+---------+-----------+----------+--------------+ SFJ      Full                                                        +---------+---------------+---------+-----------+----------+--------------+ FV Prox  Full                                                        +---------+---------------+---------+-----------+----------+--------------+ FV Mid   Full                                                        +---------+---------------+---------+-----------+----------+--------------+ FV DistalFull                                                        +---------+---------------+---------+-----------+----------+--------------+ PFV      Full                                                        +---------+---------------+---------+-----------+----------+--------------+ POP      Full           Yes      Yes                                 +---------+---------------+---------+-----------+----------+--------------+ PTV      Full                                                        +---------+---------------+---------+-----------+----------+--------------+  PERO     Full                                                        +---------+---------------+---------+-----------+----------+--------------+   +---------+---------------+---------+-----------+----------+--------------+ LEFT     CompressibilityPhasicitySpontaneityPropertiesThrombus Aging +---------+---------------+---------+-----------+----------+--------------+ CFV      Full           Yes      Yes                                 +---------+---------------+---------+-----------+----------+--------------+ SFJ      Full                                                        +---------+---------------+---------+-----------+----------+--------------+ FV Prox  Full                                                         +---------+---------------+---------+-----------+----------+--------------+ FV Mid   Full                                                        +---------+---------------+---------+-----------+----------+--------------+ FV DistalFull                                                        +---------+---------------+---------+-----------+----------+--------------+ PFV      Full                                                        +---------+---------------+---------+-----------+----------+--------------+ POP      Full           Yes      Yes                                 +---------+---------------+---------+-----------+----------+--------------+ PTV      Full                                                        +---------+---------------+---------+-----------+----------+--------------+ PERO     Full                                                        +---------+---------------+---------+-----------+----------+--------------+  Summary: BILATERAL: - No evidence of deep vein thrombosis seen in the lower extremities, bilaterally. -No evidence of popliteal cyst, bilaterally.   *See table(s) above for measurements and observations. Electronically signed by Harold Barban MD on 04/10/2022 at 11:28:37 PM.    Final      LOS: 3 days   Oren Binet, MD  [facility]    To contact the attending provider between 7A-7P or the covering provider during after hours 7P-7A, please log into the web site [email or web address] and access using universal [place] password for that web site. If you do not have the password, please call the hospital operator.  04/12/2022, 11:19 AM

## 2022-04-12 NOTE — Progress Notes (Signed)
Follow up CXR-- persistent but not enlarging pneumothorax. Does not appear to be in communication with pigtail catheter.  Julian Hy, DO 04/12/22 3:40 PM [facility]

## 2022-04-12 NOTE — Progress Notes (Signed)
ANTICOAGULATION CONSULT NOTE   Pharmacy Consult for heparin Indication: atrial fibrillation  Allergies  Allergen Reactions   Lactose Intolerance (Gi) Other (See Comments)    Intolerance    Lovastatin Other (See Comments)    Unknown reaction   Mometasone Furo-Formoterol Fum Other (See Comments)    Loss of appetite, laryngitis    Peanut-Containing Drug Products Other (See Comments)   Prozac [Fluoxetine Hcl] Other (See Comments)    Jumpy   Sulfa Antibiotics Other (See Comments)    Unknown reaction    Patient Measurements: Weight: 46.8 kg (103 lb 2.8 oz) Heparin Dosing Weight: 46.8 kg (TBW)  Vital Signs: Temp: 98.6 F (37 C) (12/14 0800) Temp Source: Oral (12/14 0800) BP: 132/61 (12/14 1600) Pulse Rate: 83 (12/14 1600)  Labs: Recent Labs    04/10/22 0342 04/11/22 0307 04/12/22 0440 04/12/22 0449 04/12/22 0450 04/12/22 1557  HGB 8.3* 8.3*  --  9.0*  --   --   HCT 25.1* 24.5*  --  27.4*  --   --   PLT 331 328  --  334  --   --   APTT  --   --   --   --  21* 47*  HEPARINUNFRC  --   --  >1.10*  --   --   --   CREATININE 0.42* 0.49  --  0.54  --   --      Estimated Creatinine Clearance: 38.7 mL/min (by C-G formula based on SCr of 0.54 mg/dL).  Assessment: Teodora Medici is an 86 yo female admitted on 04/08/2022 with aspiration PNA. Patient had pleural catheter inserted 12/13 and pharmacy consulted for heparin dosing for A-fib. Patient was on Eliquis prior to admission and has been on Eliquis inpatient. Last Eliquis dose 04/11/2022 at 1045. Expect heparin levels to be falsely elevated from Eliquis dose (12/13). Will use aPTT for drug monitoring until aPTT and anti-Xa correlate.   aPTT remains subtherapeutic (47 sec) on infusion at 800 units/hr. No issues with line or bleeding reported per RN.  Goal of Therapy:  Heparin level 0.3-0.7 units/ml aPTT 66-102 seconds Monitor platelets by anticoagulation protocol: Yes   Plan:  Increase heparin to 950 units/hr Re-check heparin level  and aPTT in 8 hours  Sherlon Handing, PharmD, BCPS Please see amion for complete clinical pharmacist phone list 04/12/2022 5:07 PM

## 2022-04-12 NOTE — Progress Notes (Signed)
NAME:  Julie Montgomery, MRN:  [id number], DOB:  07/23/1935, LOS: 3 ADMISSION DATE:  04/08/2022, CONSULTATION DATE:  04/08/2022 REFERRING MD:  Armandina Gemma - EDP, CHIEF COMPLAINT:  SOB, body aches, nausea   History of Present Illness:  86 year old woman who presented to [facility] ED 12/10 with SOB, body aches, nausea and lethargy with FTT. 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 She has been hospitalized 5 times in 2023. Most recent, patient was discharged to [facility] 12/1.  On ED arrival, patient was pleasantly demented/slightly disoriented and c/o back pain. Bradycardic on tele with HR 40s-50s. BP 90s/70s. SpO2 80% on RA, improved to 100% on 2LNC. Labs were notable for WBC 12.0, BNP 904, K 5.7. Lasix '40mg'$  IV administered by EDP. Cardiology consulted with plan for transfer to [facility] for further care.  CTA PE protocol obtained 12/10 prior to transfer was negative for PE; demonstrated moderate pleural effusions and compressive atelectasis and nondependent RLL consolidation c/f PNA, GGOs of the RUL, RML and LUL (infectious vs inflammatory), anasarca.  Transferred from [facility] to [facility]. PCCM consulted; initial plan for admission to [facility] but patient required dopamine gtt initiation for   Pertinent Medical History:  Dementia, recurrent C. difficile infection, permanent A-fib on Eliquis, chronic hyponatremia, failure to thrive, stroke, H. pylori, abdominal perforation with subtotal colectomy and end ileostomy (03/21/2022)  Significant Hospital Events: Including procedures, antibiotic start and stop dates in addition to other pertinent events   12/10 Presented to [facility] ED with SOB, malaise/fatigue, lethargy and FTT. CTA Chest negative for PE but +bilateral moderate pleural effusions and GGOs of RUL/RML/LUL. Transferred to [facility] for  further evaluation. 12/11 PCCM reconsulted as patient placed on dopamine. On-going [place] discussion. Still full code   Interim History / Subjective:  Complains of pain everywhere  Objective:  Blood pressure 115/62, pulse 74, temperature 98.6 F (37 C), temperature source Oral, resp. rate (!) 21, weight 46.8 kg, SpO2 98 %.        Intake/Output Summary (Last 24 hours) at 04/12/2022 1002 Last data filed at 04/12/2022 4132 Gross per 24 hour  Intake 371.91 ml  Output 1605 ml  Net -1233.09 ml   Filed Weights   04/09/22 1813  Weight: 46.8 kg    Physical exam:  General: Frail female who complains of pain and inability to get comfortable HEENT: MM pink/moist Neuro: Grossly intact CV: Heart sounds are regular PULM: Decreased breath sounds left chest tube without airleak  GI: soft, bsx4 active ostomy is noted  Extremities: warm/dry, 1+ edema  Skin: no rashes or lesions   Resolved Hospital Problem List:  Hyperkalemia   Assessment & Plan:  Septic shock due to pneumonia (possible aspiration) +/- Enterococcus Faecalis UTI (80K colony count) Afebrile. No sig change in wbc ct Plan Day 4 of 5 of [employer] antihypertensives Continue midodrine    Acute respiratory failure with hypoxia due to pneumonia and pleural effusion S/p thora 12/11. No fluid sent for analysis. 600 ml removed. Described as straw colored.  Plan Oxygen as needed Incentive spirometer Antibiotics Chest tube drainage 510 cc on 04/11/2022 and 95 cc so far on 04/13/1999   Chronic atrial fibrillation Patient was bradycardic upon arrival Remains bradycardic but currently NSR Plan Continue amiodarone Continue anticoagulation   Fluid and electrolyte imbalance: Hypokalemia, Chronic hyponatremia (stable) Recent Labs  Lab 04/10/22 0342 04/11/22 0307 04/12/22 0449  NA 135 130*  129*   Recent Labs  Lab 04/10/22 0342 04/11/22 0307 04/12/22 0449  K 3.7 3.4* 4.3    Plan Monitor labs  Right-sided  end ileostomy secondary to acute intestinal perforation secondary to C. difficile toxic megacolon on 11/22 Plan Continue wound care Oral vancomycin   Failure to thrive Multiple hospitalizations Palliative care is following Remains full code   Best Practice: (right click and "Reselect all SmartList Selections" daily)   Diet/type: Regular consistency (see orders) DVT prophylaxis: SCDs, Eliquis GI prophylaxis: N/A Central venous access:  N/A Foley:  N/A Code Status:  full code Last date of multidisciplinary goals of care discussion [12/11 - ED bedside discussion with patient, husband, son, CCM. Patient states that if she were to become more critically ill, she wants to "live as long as God gives me breath" and wants to "fight and live".]  Critical care time: NA  Richardson Landry Karalyn Kadel ACNP Acute Care Nurse Practitioner [place] Please consult Amion 04/12/2022, 10:02 AM

## 2022-04-13 DIAGNOSIS — J189 Pneumonia, unspecified organism: Secondary | ICD-10-CM | POA: Diagnosis not present

## 2022-04-13 DIAGNOSIS — Z7189 Other specified counseling: Secondary | ICD-10-CM | POA: Diagnosis not present

## 2022-04-13 DIAGNOSIS — E43 Unspecified severe protein-calorie malnutrition: Secondary | ICD-10-CM | POA: Diagnosis not present

## 2022-04-13 DIAGNOSIS — J9601 Acute respiratory failure with hypoxia: Secondary | ICD-10-CM | POA: Diagnosis not present

## 2022-04-13 LAB — BASIC METABOLIC PANEL
Anion gap: 8 (ref 5–15)
BUN: 14 mg/dL (ref 8–23)
CO2: 30 mmol/L (ref 22–32)
Calcium: 8.7 mg/dL — ABNORMAL LOW (ref 8.9–10.3)
Chloride: 93 mmol/L — ABNORMAL LOW (ref 98–111)
Creatinine, Ser: 0.48 mg/dL (ref 0.44–1.00)
GFR, Estimated: >60 mL/min (ref >60)
Glucose, Bld: 112 mg/dL — ABNORMAL HIGH (ref 70–99)
Potassium: 4.1 mmol/L (ref 3.5–5.1)
Sodium: 131 mmol/L — ABNORMAL LOW (ref 135–145)

## 2022-04-13 LAB — CBC WITH DIFFERENTIAL/PLATELET
Abs Immature Granulocytes: 0.06 10*3/uL (ref 0.00–0.07)
Basophils Absolute: 0 10*3/uL (ref 0.0–0.1)
Basophils Relative: 0 %
Eosinophils Absolute: 0.1 10*3/uL (ref 0.0–0.5)
Eosinophils Relative: 0 %
HCT: 23.9 % — ABNORMAL LOW (ref 36.0–46.0)
Hemoglobin: 8 g/dL — ABNORMAL LOW (ref 12.0–15.0)
Immature Granulocytes: 0 %
Lymphocytes Relative: 4 %
Lymphs Abs: 0.6 10*3/uL — ABNORMAL LOW (ref 0.7–4.0)
MCH: 30.1 pg (ref 26.0–34.0)
MCHC: 33.5 g/dL (ref 30.0–36.0)
MCV: 89.8 fL (ref 80.0–100.0)
Monocytes Absolute: 1.6 10*3/uL — ABNORMAL HIGH (ref 0.1–1.0)
Monocytes Relative: 10 %
Neutro Abs: 12.8 10*3/uL — ABNORMAL HIGH (ref 1.7–7.7)
Neutrophils Relative %: 86 %
Platelets: 268 10*3/uL (ref 150–400)
RBC: 2.66 MIL/uL — ABNORMAL LOW (ref 3.87–5.11)
RDW: 16.2 % — ABNORMAL HIGH (ref 11.5–15.5)
WBC: 15.1 10*3/uL — ABNORMAL HIGH (ref 4.0–10.5)
nRBC: 0 % (ref 0.0–0.2)

## 2022-04-13 LAB — HEPARIN LEVEL (UNFRACTIONATED)
Heparin Unfractionated: 0.19 IU/mL — ABNORMAL LOW (ref 0.30–0.70)
Heparin Unfractionated: 0.23 IU/mL — ABNORMAL LOW (ref 0.30–0.70)

## 2022-04-13 LAB — APTT: aPTT: 66 seconds — ABNORMAL HIGH (ref 24–36)

## 2022-04-13 LAB — CYTOLOGY - NON PAP

## 2022-04-13 NOTE — Progress Notes (Signed)
ANTICOAGULATION CONSULT NOTE   Pharmacy Consult for heparin Indication: atrial fibrillation  Allergies  Allergen Reactions   Lactose Intolerance (Gi) Other (See Comments)    Intolerance    Lovastatin Other (See Comments)    Unknown reaction   Mometasone Furo-Formoterol Fum Other (See Comments)    Loss of appetite, laryngitis    Peanut-Containing Drug Products Other (See Comments)   Prozac [Fluoxetine Hcl] Other (See Comments)    Jumpy   Sulfa Antibiotics Other (See Comments)    Unknown reaction    Patient Measurements: Weight: 46.8 kg (103 lb 2.8 oz) Heparin Dosing Weight: 46.8 kg (TBW)  Vital Signs: Temp: 98.4 F (36.9 C) (12/15 0400) Temp Source: Axillary (12/15 0000) BP: 107/49 (12/15 0400) Pulse Rate: 77 (12/15 0400)  Labs: Recent Labs    04/11/22 0307 04/12/22 0440 04/12/22 0449 04/12/22 0450 04/12/22 1557 04/13/22 0310  HGB 8.3*  --  9.0*  --   --  8.0*  HCT 24.5*  --  27.4*  --   --  23.9*  PLT 328  --  334  --   --  268  APTT  --   --   --  21* 47* 66*  HEPARINUNFRC  --  >1.10*  --   --   --  0.19*  CREATININE 0.49  --  0.54  --   --  0.48     Estimated Creatinine Clearance: 38.7 mL/min (by C-G formula based on SCr of 0.48 mg/dL).   Medical History: Past Medical History:  Diagnosis Date   Adenomatous polyp of colon 2020   Allergy    Anxiety    Basal cell carcinoma (BCC)    Cataract    Depression    Helicobacter pylori gastritis    Osteopenia    Paroxysmal atrial fibrillation (HCC)    Pneumonia    hx of 03/2008   Stroke [facility])    TIA long time ago    Medications:  Infusions:   sodium chloride Stopped (04/11/22 1414)   heparin 950 Units/hr (04/12/22 1932)   piperacillin-tazobactam (ZOSYN)  IV 3.375 g (04/12/22 2248)    Assessment: Julie Montgomery is an 86 yo female admitted on 04/08/2022 with aspiration PNA. Patient had pleural catheter inserted 12/13 and pharmacy consulted for heparin dosing for A-fib. Patient was on Eliquis prior to admission  and has been on Eliquis inpatient. Last Eliquis dose 04/11/2022 at 1045. Expect heparin levels to be falsely elevated from Eliquis dose this morning (12/13). Will use aPTT for drug monitoring until aPTT and anti-Xa correlate. Hgb 8.3 and stable, plt 328. Baseline aPTT of 33 on 04/08/2022.  12/15 AM update:  Heparin level sub-therapeutic  No need for further aPTT's  Goal of Therapy:  Heparin level 0.3-0.7 units/mL Monitor platelets by anticoagulation protocol: Yes   Plan:  Inc heparin to 1000 units/hr Re-check heparin level in 8 hours  Narda Bonds, PharmD, [place] Pharmacist Phone: [phone]

## 2022-04-13 NOTE — Progress Notes (Addendum)
PROGRESS NOTE        PATIENT DETAILS Name: Julie Montgomery Age: 86 y.o. Sex: female Date of Birth: July 27, 1935 Admit Date: 04/08/2022 Admitting Physician Chesley Mires, MD [id number], Evie Lacks, MD  Brief Summary: Patient is a 86 y.o.  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  Upon stability-transferred back to [facility] service on 12/14.    Significant events: 11/06-12/01>> hospitalized patient for C. difficile infection with toxic megacolon requiring colectomy/diverting ostomy.   12/10>> admit to [facility]-severe sepsis due to PNA. 12/11>> ultrasound-guided thoracocentesis by IR. 12/11>> hypotensive-transferred to ICU for pressors. 12/13>> left-sided chest tube placed 12/14>> transferred to [facility] 12/15>> family meeting with palliative care-Home hospice planned on discharge.DNR  Significant studies: 12/10>> CT abdomen/pelvis: Interval increase in large bilateral pleural effusions with lower lobe consolidation, subtotal colectomy/right-sided end ileostomy. 12/10>> CT angio chest: No PE, moderate bilateral pleural effusion, groundglass opacities right upper/middle/left upper lobes. 12/11>> echo: EF 60-65%  Significant microbiology data: 12/10>> COVID/influenza/RSV: Negative 12/10>>Urine culture: Enterococcus faecalis  12/10>> blood culture: No growth 12/11>> blood culture: No growth 12/11>> respiratory virus panel: Negative 12/11>> stool C. difficile: Negative 12/13>> pleural fluid culture: No growth  Procedures: 12/11>> thoracocentesis by IR 12/13>> chest tube  placement.  Consults: PCCM Cardiology Palliative care  Subjective: Sitting in bedside chair-spouse at bedside.  No major issues overnight.  Remains very pleasantly confused.  Objective: Vitals: Blood pressure 116/60, pulse 77, temperature 97.7 F (36.5 C), temperature source Oral, resp. rate 18, weight 46.8 kg, SpO2 96 %.   Exam: Gen Exam: Pleasantly confused-not in any distress HEENT:atraumatic, normocephalic Chest: B/L clear to auscultation anteriorly CVS:S1S2 regular Abdomen:soft non tender, non distended Extremities:++ edema Neurology: Non focal-generalized weakness Skin: no rash  Pertinent Labs/Radiology:    Latest Ref Rng & Units 04/13/2022    3:10 AM 04/12/2022    4:49 AM 04/11/2022    3:07 AM  CBC  WBC 4.0 - 10.5 K/uL 15.1  15.3  12.8   Hemoglobin 12.0 - 15.0 g/dL 8.0  9.0  8.3   Hematocrit 36.0 - 46.0 % 23.9  27.4  24.5   Platelets 150 - 400 K/uL 268  334  328     Lab Results  Component Value Date   NA 131 (L) 04/13/2022   K 4.1 04/13/2022   CL 93 (L) 04/13/2022   CO2 30 04/13/2022      Assessment/Plan: Septic shock due to aspiration PNA and complicated UTI Sepsis physiology has resolved On day 5/5 Zosyn BP stable but on midodrine.  Acute hypoxic respiratory failure due to probable aspiration pneumonia and large bilateral pleural effusion Currently stable on 2-3 L of oxygen Antibiotics as above-chest tube in place for pleural effusion Wean down oxygen as tolerated  Bilateral pleural effusion Loculated left pleural effusion-s/p pigtail catheter placement on 12/13 Iatrogenic apical pneumothorax following chest tube placement PCCM following-defer chest tube management to PCCM service.  Chronic atrial fibrillation Continue amiodarone/IV heparin (transition to DOAC when chest tube out) Monitor in telemetry.  History of recurrent C. difficile infection-with toxic megacolon requiring colectomy/ostomy 11/22 C. difficile studies negative Minimize  antibiotics On  oral vancomycin  Hyponatremia Mild Due to hypervolemia is volume overloaded Continue Bumex  Dementia Pleasantly confused Maintain delirium precautions Continue Namenda  Anasarca Likely due to hypoalbuminemia Doubt CHF, UA negative for protein. Optimize nutrition status is much as possible Gentle diuretics  Palliative care Severe failure to thrive syndrome Multiple comorbid issues as outlined above-very frail/cachectic Frequent hospitalizations Multiple goals of care discussion done by palliative care team-now DNR-family ultimately plans to take home with hospice care [facility] team following.  Pressure Ulcer: Pressure Injury 03/09/22 Sacrum Mid Unstageable - Full thickness tissue loss in which the base of the injury is covered by slough (yellow, tan, gray, green or brown) and/or eschar (tan, brown or black) in the wound bed. (Active)  03/09/22 2000  Location: Sacrum  Location Orientation: Mid  Staging: Unstageable - Full thickness tissue loss in which the base of the injury is covered by slough (yellow, tan, gray, green or brown) and/or eschar (tan, brown or black) in the wound bed.  Wound Description (Comments):   Present on Admission: Yes  Dressing Type Foam - Lift dressing to assess site every shift 04/12/22 1934     Pressure Injury 03/29/22 Heel Right Deep Tissue Pressure Injury - Purple or maroon localized area of discolored intact skin or blood-filled blister due to damage of underlying soft tissue from pressure and/or shear. (Active)  03/29/22 2003  Location: Heel  Location Orientation: Right  Staging: Deep Tissue Pressure Injury - Purple or maroon localized area of discolored intact skin or blood-filled blister due to damage of underlying soft tissue from pressure and/or shear.  Wound Description (Comments):   Present on Admission:   Dressing Type Foam - Lift dressing to assess site every shift 04/12/22 0617     Pressure Injury 04/09/22 Heel Left Deep  Tissue Pressure Injury - Purple or maroon localized area of discolored intact skin or blood-filled blister due to damage of underlying soft tissue from pressure and/or shear. (Active)  04/09/22 1836  Location: Heel  Location Orientation: Left  Staging: Deep Tissue Pressure Injury - Purple or maroon localized area of discolored intact skin or blood-filled blister due to damage of underlying soft tissue from pressure and/or shear.  Wound Description (Comments):   Present on Admission: Yes  Dressing Type Foam - Lift dressing to assess site every shift 04/12/22 1934   Underweight: Estimated body mass index is 18.87 kg/m as calculated from the following:   Height as of 03/05/22: '5\' 2"'$  (1.575 m).   Weight as of this encounter: 46.8 kg.   Code status:   Code Status: DNR   DVT Prophylaxis: IV heparin   Family Communication: Spouse at bedside   Disposition Plan: Status is: Inpatient Remains inpatient appropriate because: Severity of illness   Planned Discharge Destination: Home with hospice over the next few days.   Diet: Diet Order             Diet regular Room service appropriate? Yes; Fluid consistency: Thin  Diet effective now                     Antimicrobial agents: Anti-infectives (From admission, onward)    Start     Dose/Rate Route Frequency Ordered Stop   04/10/22 1900  vancomycin (VANCOCIN) IVPB 1000 mg/200 mL premix  Status:  Discontinued        1,000 mg 200 mL/hr over 60 Minutes Intravenous Every 36 hours 04/09/22 0756 04/10/22 0839   04/10/22 0930  piperacillin-tazobactam (ZOSYN) IVPB 3.375 g  3.375 g 12.5 mL/hr over 240 Minutes Intravenous Every 8 hours 04/10/22 0843 04/13/22 2359   04/09/22 1800  azithromycin (ZITHROMAX) 500 mg in sodium chloride 0.9 % 250 mL IVPB  Status:  Discontinued        500 mg 250 mL/hr over 60 Minutes Intravenous Every 24 hours 04/09/22 0401 04/10/22 0839   04/09/22 1000  ceFEPIme (MAXIPIME) 2 g in sodium chloride 0.9 %  100 mL IVPB  Status:  Discontinued       Note to Pharmacy: Pharmacy to dose   2 g 200 mL/hr over 30 Minutes Intravenous 2 times daily 04/09/22 0401 04/10/22 0839   04/09/22 1000  vancomycin (VANCOCIN) capsule 125 mg        125 mg Oral 2 times daily 04/09/22 0401 04/18/22 2359   04/09/22 0600  ceFEPIme (MAXIPIME) 2 g in sodium chloride 0.9 % 100 mL IVPB  Status:  Discontinued        2 g 200 mL/hr over 30 Minutes Intravenous Every 8 hours 04/09/22 0401 04/09/22 0405   04/09/22 0430  vancomycin (VANCOCIN) IVPB 1000 mg/200 mL premix        1,000 mg 200 mL/hr over 60 Minutes Intravenous  Once 04/09/22 0424 04/09/22 0725   04/08/22 2315  ceFEPIme (MAXIPIME) 2 g in sodium chloride 0.9 % 100 mL IVPB        2 g 200 mL/hr over 30 Minutes Intravenous  Once 04/08/22 2309 04/08/22 2352   04/08/22 1945  cefTRIAXone (ROCEPHIN) 1 g in sodium chloride 0.9 % 100 mL IVPB  Status:  Discontinued        1 g 200 mL/hr over 30 Minutes Intravenous  Once 04/08/22 1937 04/08/22 2121   04/08/22 1945  azithromycin (ZITHROMAX) 500 mg in sodium chloride 0.9 % 250 mL IVPB  Status:  Discontinued        500 mg 250 mL/hr over 60 Minutes Intravenous  Once 04/08/22 1937 04/08/22 2121        MEDICATIONS: Scheduled Meds:  amiodarone  200 mg Oral Daily   bumetanide  0.5 mg Oral BID   Chlorhexidine Gluconate Cloth  6 each Topical Daily   feeding supplement  237 mL Oral BID BM   leptospermum manuka honey  1 Application Topical Daily   memantine  5 mg Oral BID   midodrine  10 mg Oral TID WC   sodium chloride flush  10 mL Intrapleural Q8H   vancomycin  125 mg Oral BID   Continuous Infusions:  sodium chloride Stopped (04/11/22 1414)   heparin 1,000 Units/hr (04/13/22 0607)   piperacillin-tazobactam (ZOSYN)  IV 3.375 g (04/13/22 0556)   PRN Meds:.sodium chloride, oxyCODONE   I have personally reviewed following labs and imaging studies  LABORATORY DATA: CBC: Recent Labs  Lab 04/09/22 2023 04/10/22 0342  04/11/22 0307 04/12/22 0449 04/13/22 0310  WBC 14.2* 11.2* 12.8* 15.3* 15.1*  NEUTROABS 12.3* 9.5* 10.8* 13.4* 12.8*  HGB 8.9* 8.3* 8.3* 9.0* 8.0*  HCT 27.1* 25.1* 24.5* 27.4* 23.9*  MCV 91.6 91.6 89.4 91.6 89.8  PLT 388 331 328 334 268     Basic Metabolic Panel: Recent Labs  Lab 04/09/22 2023 04/10/22 0342 04/11/22 0307 04/12/22 0449 04/13/22 0310  NA 134* 135 130* 129* 131*  K 4.1 3.7 3.4* 4.3 4.1  CL 101 100 95* 92* 93*  CO2 '25 27 27 25 30  '$ GLUCOSE 100* 89 106* 87 112*  BUN '15 11 14 13 14  '$ CREATININE 0.50 0.42* 0.49 0.54 0.48  CALCIUM 9.2 8.8* 8.9 9.3 8.7*     GFR: Estimated Creatinine Clearance: 38.7 mL/min (by C-G formula based on SCr of 0.48 mg/dL).  Liver Function Tests: Recent Labs  Lab 04/08/22 1825  AST 43*  ALT 50*  ALKPHOS 176*  BILITOT 0.3  PROT 7.0  ALBUMIN 3.8    Recent Labs  Lab 04/08/22 1825  LIPASE 30    No results for input(s): "AMMONIA" in the last 168 hours.  Coagulation Profile: Recent Labs  Lab 04/08/22 1825  INR 1.1     Cardiac Enzymes: No results for input(s): "CKTOTAL", "CKMB", "CKMBINDEX", "TROPONINI" in the last 168 hours.  BNP (last 3 results) Recent Labs    01/26/22 1447  PROBNP 68.0     Lipid Profile: No results for input(s): "CHOL", "HDL", "LDLCALC", "TRIG", "CHOLHDL", "LDLDIRECT" in the last 72 hours.  Thyroid Function Tests: No results for input(s): "TSH", "T4TOTAL", "FREET4", "T3FREE", "THYROIDAB" in the last 72 hours.  Anemia Panel: No results for input(s): "VITAMINB12", "FOLATE", "FERRITIN", "TIBC", "IRON", "RETICCTPCT" in the last 72 hours.  Urine analysis:    Component Value Date/Time   COLORURINE STRAW (A) 04/09/2022 0430   APPEARANCEUR CLEAR 04/09/2022 0430   LABSPEC 1.025 04/09/2022 0430   PHURINE 5.0 04/09/2022 0430   GLUCOSEU NEGATIVE 04/09/2022 0430   GLUCOSEU NEGATIVE 04/05/2021 1236   HGBUR NEGATIVE 04/09/2022 0430   BILIRUBINUR NEGATIVE 04/09/2022 0430   KETONESUR NEGATIVE  04/09/2022 0430   PROTEINUR NEGATIVE 04/09/2022 0430   UROBILINOGEN 0.2 04/05/2021 1236   NITRITE NEGATIVE 04/09/2022 0430   LEUKOCYTESUR NEGATIVE 04/09/2022 0430    Sepsis Labs: Lactic Acid, Venous    Component Value Date/Time   LATICACIDVEN 0.9 04/09/2022 2000    MICROBIOLOGY: Recent Results (from the past 240 hour(s))  Resp panel by RT-PCR (RSV, Flu A&B, Covid) Anterior Nasal Swab     Status: None   Collection Time: 04/08/22  6:05 PM   Specimen: Anterior Nasal Swab  Result Value Ref Range Status   SARS Coronavirus 2 by RT PCR NEGATIVE NEGATIVE Final    Comment: (NOTE) SARS-CoV-2 target nucleic acids are NOT DETECTED.  The SARS-CoV-2 RNA is generally detectable in upper respiratory specimens during the acute phase of infection. The lowest concentration of SARS-CoV-2 viral copies this assay can detect is 138 copies/mL. A negative result does not preclude SARS-Cov-2 infection and should not be used as the sole basis for treatment or other patient management decisions. A negative result may occur with  improper specimen collection/handling, submission of specimen other than nasopharyngeal swab, presence of viral mutation(s) within the areas targeted by this assay, and inadequate number of viral copies(<138 copies/mL). A negative result must be combined with clinical observations, patient history, and epidemiological information. The expected result is Negative.  Fact Sheet for Patients:  EntrepreneurPulse.com.au  Fact Sheet for Healthcare Providers:  IncredibleEmployment.be  This test is no t yet approved or cleared by the [place] FDA and  has been authorized for detection and/or diagnosis of SARS-CoV-2 by FDA under an Emergency Use Authorization (EUA). This EUA will remain  in effect (meaning this test can be used) for the duration of the COVID-19 declaration under Section 564(b)(1) of the Act, 21 U.S.C.section 360bbb-3(b)(1),  unless the authorization is terminated  or revoked sooner.       Influenza A by PCR NEGATIVE NEGATIVE Final   Influenza B by PCR NEGATIVE NEGATIVE Final    Comment: (NOTE) The Xpert Xpress SARS-CoV-2/FLU/RSV plus assay is intended as an aid in the diagnosis  of influenza from Nasopharyngeal swab specimens and should not be used as a sole basis for treatment. Nasal washings and aspirates are unacceptable for Xpert Xpress SARS-CoV-2/FLU/RSV testing.  Fact Sheet for Patients: EntrepreneurPulse.com.au  Fact Sheet for Healthcare Providers: IncredibleEmployment.be  This test is not yet approved or cleared by the [place] FDA and has been authorized for detection and/or diagnosis of SARS-CoV-2 by FDA under an Emergency Use Authorization (EUA). This EUA will remain in effect (meaning this test can be used) for the duration of the COVID-19 declaration under Section 564(b)(1) of the Act, 21 U.S.C. section 360bbb-3(b)(1), unless the authorization is terminated or revoked.     Resp Syncytial Virus by PCR NEGATIVE NEGATIVE Final    Comment: (NOTE) Fact Sheet for Patients: EntrepreneurPulse.com.au  Fact Sheet for Healthcare Providers: IncredibleEmployment.be  This test is not yet approved or cleared by the [place] FDA and has been authorized for detection and/or diagnosis of SARS-CoV-2 by FDA under an Emergency Use Authorization (EUA). This EUA will remain in effect (meaning this test can be used) for the duration of the COVID-19 declaration under Section 564(b)(1) of the Act, 21 U.S.C. section 360bbb-3(b)(1), unless the authorization is terminated or revoked.  Performed at [facility], [address], [place], [place] [address]   Blood Culture (routine x 2)     Status: None (Preliminary result)   Collection Time: 04/08/22  6:25 PM   Specimen: BLOOD  Result Value Ref Range Status    Specimen Description   Final    BLOOD RIGHT ANTECUBITAL Performed at [facility], [address], [place], [place] [address]    Special Requests   Final    BOTTLES DRAWN AEROBIC AND ANAEROBIC Blood Culture adequate volume Performed at [facility], [address] [place], [place] [address]    Culture   Final    NO GROWTH 4 DAYS Performed at [facility], [place] [address], [place], [place] [address]    Report Status PENDING  Incomplete  Blood Culture (routine x 2)     Status: None (Preliminary result)   Collection Time: 04/08/22  6:25 PM   Specimen: BLOOD  Result Value Ref Range Status   Specimen Description   Final    BLOOD LEFT ANTECUBITAL Performed at [facility], [address], [place], [place] [address]    Special Requests   Final    BOTTLES DRAWN AEROBIC AND ANAEROBIC Blood Culture adequate volume Performed at [facility], [address], [place], [place] [address]    Culture   Final    NO GROWTH 4 DAYS Performed at [facility], [place] [address] [place], [place] [address]    Report Status PENDING  Incomplete  Urine Culture     Status: Abnormal   Collection Time: 04/08/22 10:24 PM   Specimen: In/Out Cath Urine  Result Value Ref Range Status   Specimen Description   Final    IN/OUT CATH URINE Performed at [facility], [address] [place] [address] [place],  [address]    Special Requests   Final    NONE Performed at [facility], [place], [place] [address]    Culture 80,000 COLONIES/mL ENTEROCOCCUS FAECALIS (A)  Final   Report Status 04/11/2022 FINAL  Final   Organism ID, Bacteria ENTEROCOCCUS FAECALIS (A)  Final      Susceptibility   Enterococcus faecalis - MIC*    AMPICILLIN <=2 SENSITIVE Sensitive     NITROFURANTOIN <=16  SENSITIVE Sensitive     VANCOMYCIN 1 SENSITIVE Sensitive     * 80,000 COLONIES/mL ENTEROCOCCUS  FAECALIS  Respiratory (~20 pathogens) panel by PCR     Status: None   Collection Time: 04/09/22  4:30 AM   Specimen: Nasopharyngeal Swab; Respiratory  Result Value Ref Range Status   Adenovirus NOT DETECTED NOT DETECTED Final   Coronavirus 229E NOT DETECTED NOT DETECTED Final    Comment: (NOTE) The Coronavirus on the Respiratory Panel, DOES NOT test for the novel  Coronavirus (2019 nCoV)    Coronavirus HKU1 NOT DETECTED NOT DETECTED Final   Coronavirus NL63 NOT DETECTED NOT DETECTED Final   Coronavirus OC43 NOT DETECTED NOT DETECTED Final   Metapneumovirus NOT DETECTED NOT DETECTED Final   Rhinovirus / Enterovirus NOT DETECTED NOT DETECTED Final   Influenza A NOT DETECTED NOT DETECTED Final   Influenza B NOT DETECTED NOT DETECTED Final   Parainfluenza Virus 1 NOT DETECTED NOT DETECTED Final   Parainfluenza Virus 2 NOT DETECTED NOT DETECTED Final   Parainfluenza Virus 3 NOT DETECTED NOT DETECTED Final   Parainfluenza Virus 4 NOT DETECTED NOT DETECTED Final   Respiratory Syncytial Virus NOT DETECTED NOT DETECTED Final   Bordetella pertussis NOT DETECTED NOT DETECTED Final   Bordetella Parapertussis NOT DETECTED NOT DETECTED Final   Chlamydophila pneumoniae NOT DETECTED NOT DETECTED Final   Mycoplasma pneumoniae NOT DETECTED NOT DETECTED Final    Comment: Performed at [facility], [place]. [address] [place], [place] [address]  Culture, blood (Routine X 2) w Reflex to ID Panel     Status: None (Preliminary result)   Collection Time: 04/09/22  4:30 AM   Specimen: BLOOD RIGHT FOREARM  Result Value Ref Range Status   Specimen Description BLOOD RIGHT FOREARM  Final   Special Requests   Final    BOTTLES DRAWN AEROBIC AND ANAEROBIC Blood Culture results may not be optimal due to an inadequate volume of blood received in culture bottles   Culture   Final    NO GROWTH 4 DAYS Performed at [facility], [place] [address] [place] [address] [place], [place] [address]    Report Status PENDING  Incomplete   Culture, blood (Routine X 2) w Reflex to ID Panel     Status: None (Preliminary result)   Collection Time: 04/09/22  4:35 AM   Specimen: BLOOD  Result Value Ref Range Status   Specimen Description BLOOD LEFT ANTECUBITAL  Final   Special Requests   Final    BOTTLES DRAWN AEROBIC AND ANAEROBIC Blood Culture adequate volume   Culture   Final    NO GROWTH 4 DAYS Performed at [facility], [place] [address], [place], [place] [address]    Report Status PENDING  Incomplete  C Difficile Quick Screen w PCR reflex     Status: None   Collection Time: 04/09/22  6:14 PM  Result Value Ref Range Status   C Diff antigen NEGATIVE NEGATIVE Final   C Diff toxin NEGATIVE NEGATIVE Final   C Diff interpretation No C. difficile detected.  Final    Comment: Performed at [facility], [place] [address] [place],  [address]  MRSA Next Gen by PCR, Nasal     Status: None   Collection Time: 04/09/22  6:16 PM   Specimen: Nasal Mucosa; Nasal Swab  Result Value Ref Range Status   MRSA by PCR Next Gen NOT DETECTED NOT DETECTED Final    Comment: (NOTE) The GeneXpert MRSA Assay (  FDA approved for NASAL specimens only), is one component of a comprehensive MRSA colonization surveillance program. It is not intended to diagnose MRSA infection nor to guide or monitor treatment for MRSA infections. Test performance is not FDA approved in patients less than 3 years old. Performed at [facility], [place] [address], [place], [place] [address]   Body fluid culture w Gram Stain     Status: None (Preliminary result)   Collection Time: 04/11/22  2:08 PM   Specimen: Thoracentesis; Body Fluid  Result Value Ref Range Status   Specimen Description THORACENTESIS  Final   Special Requests NONE  Final   Gram Stain NO WBC SEEN NO ORGANISMS SEEN   Final   Culture   Final    NO GROWTH 2 DAYS Performed at [facility], [address] [place], [place] [address]    Report Status PENDING  Incomplete     RADIOLOGY STUDIES/RESULTS: DG CHEST PORT 1 VIEW  Result Date: 04/12/2022 CLINICAL DATA:  Chest tube. EXAM: PORTABLE CHEST 1 VIEW COMPARISON:  Same day. FINDINGS: Stable cardiomediastinal silhouette. Stable position of left-sided chest tube. Stable small left apical pneumothorax. Stable bilateral lung opacities. IMPRESSION: Stable position of left-sided chest tube. Stable small left apical pneumothorax. Stable bilateral lung opacities. Electronically Signed   By: Marijo Conception M.D.   On: 04/12/2022 15:29   DG Chest Port 1 View  Result Date: 04/12/2022 CLINICAL DATA:  Shortness of breath, pneumothorax. EXAM: PORTABLE CHEST 1 VIEW COMPARISON:  Same day. FINDINGS: Stable cardiomediastinal silhouette. Stable small left apical pneumothorax is noted. Stable bilateral lung opacities are noted with associated pleural effusions. Left-sided chest tube is unchanged. IMPRESSION: Stable position of left-sided chest tube. Stable small left apical pneumothorax. Stable bilateral lung opacities and pleural effusions. Electronically Signed   By: Marijo Conception M.D.   On: 04/12/2022 13:37   DG Chest Port 1 View  Result Date: 04/12/2022 CLINICAL DATA:  Follow-up pleural effusions and pneumothorax. EXAM: PORTABLE CHEST 1 VIEW COMPARISON:  04/11/2022 FINDINGS: A left pleural drain remains in place. A small approximately 5-10% left apical pneumothorax shows no significant change. Small pleural effusions and bibasilar atelectasis or infiltrates show no significant change. Bilateral upper lobe airspace disease also shows no significant change. Cardiomegaly remains stable. IMPRESSION: No significant change in small approximately 5-10% left apical pneumothorax, small bilateral pleural effusions and bibasilar atelectasis versus infiltrates. Stable bilateral upper lobe airspace disease. Electronically Signed   By: Marlaine Hind M.D.   On: 04/12/2022 08:17   DG Chest Port 1 View  Result Date: 04/11/2022 CLINICAL DATA:   Reason for exam: S/P thoracostomy tube placement EXAM: PORTABLE CHEST - 1 VIEW COMPARISON:  Earlier film of the same day FINDINGS: Pigtail left chest tube has been placed laterally at the left lung base. Significant interval decrease in size of pleural effusion. Trace apical pneumothorax. Coarse suprahilar airspace opacities appear slightly increased. Heart size and mediastinal contours are within normal limits. Aortic Atherosclerosis (ICD10-170.0). Persistent small right pleural effusion with atelectasis/consolidation in the right lung base. Visualized bones unremarkable. IMPRESSION: 1. Left chest tube placement with significant decrease in size of pleural effusion. 2. Trace left apical pneumothorax. Electronically Signed   By: Lucrezia Europe M.D.   On: 04/11/2022 15:02   DG CHEST PORT 1 VIEW  Result Date: 04/11/2022 CLINICAL DATA:  Pleural effusion EXAM: PORTABLE CHEST 1 VIEW COMPARISON:  Previous studies including the examination of 04/09/2022 FINDINGS: Small to moderate bilateral pleural effusions are seen.  There is interval increase in amount of right pleural effusion. There is no pneumothorax. There are patchy infiltrates in both upper lung fields, more so on the right side with interval improvement in the left upper lobe infiltrate. Infiltrates are seen in both lower lung fields suggesting atelectasis/pneumonia. There is no pneumothorax. Degenerative changes are noted in right shoulder. IMPRESSION: Small to moderate bilateral pleural effusions. There is interval increase in amount of right pleural effusion. There are patchy infiltrates in both lungs suggesting possible multifocal pneumonia with slight improvement in the left upper lobe infiltrate. Electronically Signed   By: Elmer Picker M.D.   On: 04/11/2022 13:13     LOS: 4 days   Oren Binet, MD  [facility]    To contact the attending provider between 7A-7P or the covering provider during after hours 7P-7A, please log into the  web site [email or web address] and access using universal [place] password for that web site. If you do not have the password, please call the hospital operator.  04/13/2022, 11:34 AM

## 2022-04-13 NOTE — Progress Notes (Signed)
NAME:  Julie Montgomery, MRN:  [id number], DOB:  1935-08-28, LOS: 4 ADMISSION DATE:  04/08/2022, CONSULTATION DATE:  04/08/2022 REFERRING MD:  Armandina Gemma - EDP, CHIEF COMPLAINT:  SOB, body aches, nausea   History of Present Illness:  86 year old woman who presented to [place] ED 12/10 with SOB, body aches, nausea and lethargy with FTT. 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 She has been hospitalized 5 times in 2023. Most recent, patient was discharged to [facility] 12/1.  On ED arrival, patient was pleasantly demented/slightly disoriented and c/o back pain. Bradycardic on tele with HR 40s-50s. BP 90s/70s. SpO2 80% on RA, improved to 100% on 2LNC. Labs were notable for WBC 12.0, BNP 904, K 5.7. Lasix '40mg'$  IV administered by EDP. Cardiology consulted with plan for transfer to [facility] for further care.  CTA PE protocol obtained 12/10 prior to transfer was negative for PE; demonstrated moderate pleural effusions and compressive atelectasis and nondependent RLL consolidation c/f PNA, GGOs of the RUL, RML and LUL (infectious vs inflammatory), anasarca.  Transferred from [facility] to [facility]. PCCM consulted; initial plan for admission to [facility] but patient required dopamine gtt initiation for   Pertinent Medical History:  Dementia, recurrent C. difficile infection, permanent A-fib on Eliquis, chronic hyponatremia, failure to thrive, stroke, H. pylori, abdominal perforation with subtotal colectomy and end ileostomy (03/21/2022)  Significant Hospital Events: Including procedures, antibiotic start and stop dates in addition to other pertinent events   12/10 Presented to [facility] ED with SOB, malaise/fatigue, lethargy and FTT. CTA Chest negative for PE but +bilateral moderate pleural effusions and GGOs of RUL/RML/LUL. Transferred to [facility] for  further evaluation. 12/11 PCCM reconsulted as patient placed on dopamine. On-going [place] discussion. Still full code   Interim History / Subjective:  Wants to go home  Objective:  Blood pressure 116/60, pulse 77, temperature 97.7 F (36.5 C), temperature source Oral, resp. rate 18, weight 46.8 kg, SpO2 96 %.        Intake/Output Summary (Last 24 hours) at 04/13/2022 0955 Last data filed at 04/13/2022 0607 Gross per 24 hour  Intake 200.83 ml  Output 2050 ml  Net -1849.17 ml   Filed Weights   04/09/22 1813  Weight: 46.8 kg    Physical exam:  General: Frail cachectic female in chair HEENT: MM pink/moist no JVD Neuro: Grossly intact CV: Heart sounds are regular PULM: Diminished in the bases, chest tube with 400 cc  GI: soft, bsx4 active  GU: Amber urine Extremities: warm/dry, 1+ edema  Skin: Pressure dressings on lower extremities   Resolved Hospital Problem List:  Hyperkalemia   Assessment & Plan:  Septic shock due to pneumonia (possible aspiration) +/- Enterococcus Faecalis UTI (80K colony count) Afebrile. No sig change in wbc ct Plan Day 5 of 5 of [employer] antihypertensive Continue midodrine    Acute respiratory failure with hypoxia due to pneumonia and pleural effusion S/p thora 12/11. No fluid sent for analysis. 600 ml removed. Described as straw colored.  Plan O2 as needed keep sats 88  pulmonary toilet Complete antibiotics Chest tube drainage 04/13/2022 400 cc May need to just discontinue chest tube despite drainage    Chronic atrial fibrillation Patient was bradycardic upon arrival Remains bradycardic but currently NSR Plan Per primary   Fluid and electrolyte imbalance: Hypokalemia, Chronic hyponatremia (stable) Recent Labs  Lab 04/11/22 0307 04/12/22 0449 04/13/22 0310  NA  130* 129* 131*   Recent Labs  Lab 04/11/22 0307 04/12/22 0449 04/13/22 0310  K 3.4* 4.3 4.1    Plan Per primary  Right-sided end ileostomy secondary to  acute intestinal perforation secondary to C. difficile toxic megacolon on 11/22 Plan Wound care Antibiotics per primary     Failure to thrive Multiple hospitalizations Palliative care is following Remains full care Chest tube putting out 400 cc daily      Best Practice: (right click and "Reselect all SmartList Selections" daily)   Diet/type: Regular consistency (see orders) DVT prophylaxis: SCDs, Eliquis GI prophylaxis: N/A Central venous access:  N/A Foley:  N/A Code Status:  full code Last date of multidisciplinary goals of care discussion [12/11 - ED bedside discussion with patient, husband, son, CCM. Patient states that if she were to become more critically ill, she wants to "live as long as God gives me breath" and wants to "fight and live".]  Critical care time: NA  Richardson Landry Sarya Linenberger ACNP Acute Care Nurse Practitioner [place] Please consult Amion 04/13/2022, 9:55 AM

## 2022-04-13 NOTE — Progress Notes (Signed)
[facility]:  Met with pt's son [name], Husband and pt herself. We discussed hospice services and goals of care and inpatient facility- They are looking for additional help in the home and we discussed hiring some help as well which they are open to. We will send the pt's son. [name] the list of agencies that he can start with and interview to see who is the best fit for the family and pt.  They are in agreement with hospice services and the goal is to not come back to the hospital.  We discussed equipment needs: They have w/c, walker, BSC, gait belt, lift chair. They will need a hospital bed, OBT and oxygen.    Equipment will be ordered for the delivery today--  We will continue to follow and assist with coordination of care to get the pt home and enrolled in hospice service.   Thank you for this referral.  Marimar Suber RN [phone]

## 2022-04-13 NOTE — Progress Notes (Signed)
ANTICOAGULATION CONSULT NOTE   Pharmacy Consult for heparin Indication: atrial fibrillation  Allergies  Allergen Reactions   Lactose Intolerance (Gi) Other (See Comments)    Intolerance    Lovastatin Other (See Comments)    Unknown reaction   Mometasone Furo-Formoterol Fum Other (See Comments)    Loss of appetite, laryngitis    Peanut-Containing Drug Products Other (See Comments)   Prozac [Fluoxetine Hcl] Other (See Comments)    Jumpy   Sulfa Antibiotics Other (See Comments)    Unknown reaction    Patient Measurements: Weight: 46.8 kg (103 lb 2.8 oz) Heparin Dosing Weight: 46.8 kg (TBW)  Vital Signs: Temp: 97.7 F (36.5 C) (12/15 1200) Temp Source: Oral (12/15 1200) BP: 102/47 (12/15 1200) Pulse Rate: 68 (12/15 1200)  Labs: Recent Labs    04/11/22 0307 04/12/22 0440 04/12/22 0449 04/12/22 0450 04/12/22 1557 04/13/22 0310 04/13/22 1357  HGB 8.3*  --  9.0*  --   --  8.0*  --   HCT 24.5*  --  27.4*  --   --  23.9*  --   PLT 328  --  334  --   --  268  --   APTT  --   --   --  21* 47* 66*  --   HEPARINUNFRC  --  >1.10*  --   --   --  0.19* 0.23*  CREATININE 0.49  --  0.54  --   --  0.48  --      Estimated Creatinine Clearance: 38.7 mL/min (by C-G formula based on SCr of 0.48 mg/dL).   Medical History: Past Medical History:  Diagnosis Date   Adenomatous polyp of colon 2020   Allergy    Anxiety    Basal cell carcinoma (BCC)    Cataract    Depression    Helicobacter pylori gastritis    Osteopenia    Paroxysmal atrial fibrillation (HCC)    Pneumonia    hx of 03/2008   Stroke [facility])    TIA long time ago    Medications:  Infusions:   sodium chloride Stopped (04/11/22 1414)   heparin 1,000 Units/hr (04/13/22 0607)   piperacillin-tazobactam (ZOSYN)  IV 3.375 g (04/13/22 0556)    Assessment: GK is an 86 yo female admitted on 04/08/2022 with aspiration PNA. Patient had pleural catheter inserted 12/13 and pharmacy consulted for heparin dosing for A-fib.  Patient was on Eliquis prior to admission and has been on Eliquis inpatient. Last Eliquis dose 04/11/2022 at 1045. Baseline aPTT of 33 on 04/08/2022. CBC trend down: Hgb 8, plt 268.   Heparin level trended up but remains sub-therapeutic this afternoon at 0.23. No issues with infusion or bleeding, per RN.    Goal of Therapy:  Heparin level 0.3-0.7 units/mL Monitor platelets by anticoagulation protocol: Yes   Plan:  Inc heparin to 1100 units/hr Re-check heparin level in 8 hours Check heparin level daily while on heparin Continue to monitor H&H and platelets   Thank you for allowing [place] to participate in this patients care. Jens Som, PharmD 04/13/2022 2:31 PM  **Pharmacist phone directory can be found on [email or web address] listed under [place]**

## 2022-04-13 NOTE — Progress Notes (Signed)
Patient is not in restraint at the beginning of this shift.  She is not pulling on lines and drain throughout this shift so far.  She is calm and cooperative.  She is in bed resting.  Will continue to monitor.

## 2022-04-13 NOTE — Progress Notes (Addendum)
Daily Progress Note   Patient Name: Julie Montgomery       Date: 04/13/2022 DOB: 09/11/1935  Age: 86 y.o. MRN#: [id number] Attending Physician: Jonetta Osgood, MD Primary Care Physician: Cassandria Anger, MD Admit Date: 04/08/2022  Reason for Consultation/Follow-up: Establishing goals of care  Subjective: Medical records reviewed including progress notes, labs, imaging. Patient assessed at the bedside.  She is sitting in bedside chair, reports feeling about the same. Per family, she had a very large breakfast and slept much better last night. Her son [name] and husband Timmothy Sours are present visiting. Discussed with Webb Silversmith RN who has just completed her visit with family as well.   Family has decided to go with [facility] [place] due to the availability of hospice facility when needed in the course of patient's illness.  They are going to look over the provided resources for caregiver support agencies today.  We discussed the option of comfort focused care in the hospital while coordinating for hospice at home.  Given patient has had a good couple of days, they would like to continue with current care and medically optimized prior to discharge.  We discussed the option of liberalizing pain medication and they do not feel like this is necessary at this time.  Patient does not like to feel excessively groggy.  Followed up on family's CODE STATUS conversation yesterday.  Husband confirms he would not want patient to go through this.  Son is understandably tearful and nods in agreement.  We discussed his concerns about how to help patient come to accept this and the option of ongoing counseling with social work and chaplain's and hospice.  No other concerns at this time.  Questions and  concerns addressed. PMT will continue to support holistically.   Addendum: Received message from patient's son on PMT service line.  Returned his call and discussed his concerns with the nursing care received yesterday and today.  He would like to speak with patient's charge nurse and asks for assistance advocating for the patient.  I then called [facility] and spoke with Thurmond Butts to relay son's concerns.  Thurmond Butts is appreciative and states he will address this.  Length of Stay: 4  Physical Exam Vitals and nursing note reviewed.  Constitutional:      General: She is not in  acute distress.    Appearance: She is ill-appearing.  Cardiovascular:     Rate and Rhythm: Normal rate.  Pulmonary:     Effort: Pulmonary effort is normal. No respiratory distress.  Skin:    General: Skin is warm and dry.  Neurological:     Mental Status: She is alert. She is confused.     Motor: Weakness present.  Psychiatric:        Mood and Affect: Mood normal.        Behavior: Behavior normal.        Cognition and Memory: Cognition is impaired. Memory is impaired.            Vital Signs: BP 116/60 (BP Location: Left Arm)   Pulse 77   Temp 97.7 F (36.5 C) (Oral)   Resp 18   Wt 46.8 kg   SpO2 96%   BMI 18.87 kg/m  SpO2: SpO2: 96 % O2 Device: O2 Device: Room Air O2 Flow Rate: O2 Flow Rate (L/min): 3 L/min      Palliative Assessment/Data: PPS 30-40%   Palliative Care Assessment & Plan   Patient Profile: 86 y.o. female  with past medical history of dementia, recurrent C. difficile infection, permanent A-fib on Eliquis, chronic hyponatremia,  failure to thrive, stroke, H. pylori, abdominal perforation with subtotal colectomy and end ileostomy (03/21/2022) during recent prolonged hospitalization 03/05/22-03/30/22  admitted on 04/08/2022 with shortness of breath and bodyaches.    Patient was initially seen at [facility] ED and transferred to [facility] upon recommendation of critical care.  She is now admitted with healthcare  acquired pneumonia, sepsis, acute hypoxic respiratory failure, acute diastolic heart failure exacerbation. PMT has been consulted to assist with goals of care conversation.  Patient is familiar to our service from her last hospitalization.  Assessment: Principal Problem:   HCAP (healthcare-associated pneumonia) Active Problems:   Coronary atherosclerosis due to calcified coronary lesion   Long term current use of antiarrhythmic drug   Atherosclerosis of aorta (HCC)   Severe protein-energy malnutrition (HCC)   Septic shock (HCC)   Elevated brain natriuretic peptide (BNP) level   Loculated pleural effusion   Status post thoracentesis   Encounter for long-term (current) use of antibiotics   Acute respiratory failure with hypoxia (HCC)   Hypotension   Physical deconditioning   Concern about end of life  Recommendations/Plan: CODE STATUS updated to DNR today.  Gold form signed and placed on patient's hard chart.  Will scan copy into EMR Continue current care for now for medical optimization prior to discharge home with hospice [name] family still needs to arrange for at least one additional private caregiver PMT will continue to follow and support holistically   Prognosis:  Poor long-term prognosis given recurrent acute illnesses/hospitalizations, chronic debility, and failure to thrive   Discharge Planning: Home with Hospice  Care plan was discussed with patient, patient's son, patient's husband, Dr. Sloan Leiter, Burman Nieves, NP, Webb Silversmith RN/HOP hospital liaison    MDM: [place]   Dorthy Cooler, [facility] Palliative Medicine Team Team phone # [phone]  Thank you for allowing the Palliative Medicine Team to assist in the care of this patient. Please utilize secure chat with additional questions, if there is no response within 30 minutes please call the above phone number.  Palliative Medicine Team providers are available by phone from 7am to 7pm daily and can be reached  through the team cell phone.  Should this patient require assistance outside of these hours, please call the patient's  attending physician.  Portions of this note are a verbal dictation therefore any spelling and/or grammatical errors are due to the "[place]" system interpretation.

## 2022-04-13 NOTE — Progress Notes (Signed)
Physical Therapy Treatment Patient Details Name: Julie Montgomery MRN: [id number] DOB: 07-11-1935 Today's Date: 04/13/2022   History of Present Illness 86 year old woman who presented to [facility] ED 12/10 with SOB, body aches, nausea and lethargy with FTT. Transferred to [facility] with sepsis, PNA and UTI.  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 She has been hospitalized 5 times in 2023. Most recent, patient was discharged to [facility] 12/1.    PT Comments    Received pt semi-reclined in bed with family present at bedside. Pt initially refusing participation, stating "oh no, not today" but with increased time, encouragement, and education on benefits of OOB mobility, pt agreed to transfer into recliner to eat breakfast. Pt required mod A for bed mobility and min A to transfer into recliner with R HHA. Pt pleased with herself but still anxious and fearful of movement. Pt would benefit from continued acute therapy prior to returning home. Acute PT to cont to follow.    Recommendations for follow up therapy are one component of a multi-disciplinary discharge planning process, led by the attending physician.  Recommendations may be updated based on patient status, additional functional criteria and insurance authorization.  Follow Up Recommendations  Home health PT (after continued work with acute PT)     Assistance Recommended at Discharge Frequent or constant Supervision/Assistance  Patient can return home with the following A little help with bathing/dressing/bathroom;Assistance with cooking/housework;Direct supervision/assist for medications management;Direct supervision/assist for financial management;Assist for transportation;Help with stairs or ramp for entrance;Assistance with feeding;A little help with walking and/or  transfers   Equipment Recommendations  None recommended by PT    Recommendations for Other Services       Precautions / Restrictions Precautions Precautions: Fall;Other (comment) Precaution Comments: ostomy, bil foot wounds, "tailbone hurts" Restrictions Weight Bearing Restrictions: No     Mobility  Bed Mobility Overal bed mobility: Needs Assistance Bed Mobility: Rolling, Supine to Sit Rolling: Mod assist   Supine to sit: Mod assist     General bed mobility comments: HOB elevated and use of bedrails with cues for technique, however pt with poor carry over of cues Patient Response: Anxious  Transfers Overall transfer level: Needs assistance Equipment used: 1 person hand held assist Transfers: Sit to/from Stand, Bed to chair/wheelchair/BSC Sit to Stand: Min assist Stand pivot transfers: Min assist         General transfer comment: cues for anterior weight shifiting and upright posture once standing    Ambulation/Gait               General Gait Details: pt refused   Stairs             Wheelchair Mobility    Modified Rankin (Stroke Patients Only)       Balance Overall balance assessment: Needs assistance Sitting-balance support: Feet supported, Bilateral upper extremity supported Sitting balance-Leahy Scale: Fair Sitting balance - Comments: able to maintain static sitting balance with close supervision but required min guard for dynamic sitting balance. Pt very fearful constantly asking family member to "help her" despite not needing any assist once sitting up   Standing balance support: Single extremity supported Standing balance-Leahy Scale: Poor Standing balance comment: required min A for static and dynamic standing balance  Cognition Arousal/Alertness: Awake/alert Behavior During Therapy: Anxious Overall Cognitive Status: History of cognitive impairments - at baseline                        Memory: Decreased short-term memory Following Commands: Follows one step commands with increased time, Follows one step commands inconsistently Safety/Judgement: Decreased awareness of deficits, Decreased awareness of safety   Problem Solving: Slow processing, Difficulty sequencing, Requires verbal cues, Decreased initiation General Comments: pt initally refusing to participate with therapist, but agreeable with max enocuragement from therapist and from family in room. Educated pt on importance of repositioning and OOB mobility for strength/endurance        Exercises      General Comments General comments (skin integrity, edema, etc.): SPO2 94% on 3.5L O2      Pertinent Vitals/Pain Pain Assessment Pain Assessment: Faces Faces Pain Scale: Hurts even more Pain Location: bottom/sacrum and back Pain Descriptors / Indicators: Discomfort, Guarding, Sore, Aching, Grimacing Pain Intervention(s): Limited activity within patient's tolerance, Monitored during session, Repositioned    Home Living                          Prior Function            PT Goals (current goals can now be found in the care plan section) Acute Rehab PT Goals Patient Stated Goal: to go home PT Goal Formulation: With patient/family Time For Goal Achievement: 04/25/22 Potential to Achieve Goals: Fair Progress towards PT goals: Progressing toward goals    Frequency    Min 3X/week      PT Plan Current plan remains appropriate    Co-evaluation              AM-PAC PT "6 Clicks" Mobility   Outcome Measure  Help needed turning from your back to your side while in a flat bed without using bedrails?: A Lot Help needed moving from lying on your back to sitting on the side of a flat bed without using bedrails?: A Lot Help needed moving to and from a bed to a chair (including a wheelchair)?: A Little Help needed standing up from a chair using your arms (e.g., wheelchair or bedside chair)?: A  Little Help needed to walk in hospital room?: A Little Help needed climbing 3-5 steps with a railing? : A Lot 6 Click Score: 15    End of Session Equipment Utilized During Treatment: Gait belt;Oxygen Activity Tolerance: Patient limited by fatigue;Patient limited by pain;Other (comment) (limited by fear of movement) Patient left: in chair;with call bell/phone within reach;with family/visitor present Nurse Communication: Mobility status PT Visit Diagnosis: Unsteadiness on feet (R26.81);Muscle weakness (generalized) (M62.81);Difficulty in walking, not elsewhere classified (R26.2);Pain Pain - Right/Left:  (back) Pain - part of body:  (back)     Time: 7939-0300 PT Time Calculation (min) (ACUTE ONLY): 19 min  Charges:  $Therapeutic Activity: 8-22 mins                     Becky Sax PT, DPT  Blenda Nicely 04/13/2022, 9:30 AM

## 2022-04-13 NOTE — Consult Note (Signed)
   [facility] CM Inpatient Consult   04/13/2022  Julie Montgomery 07/16/1935 [id number]  [place] [facility] [ACO] Patient: Medicare ACO REACH  Primary Care Provider: Cassandria Anger, MD,  at [facility]   Chart reviewed and reveals the patient is currently transitioning to Hospice/Palliative Care  3 pm Rounds and note:  Patient up in chair, fspouse at bedside noted.  Chart reviewed and patient is for home hospice at the time of this review.  Plan: Patient will have full case management services through Hospice and needs will be met at the hospice level of care. No [facility] Care Management is planned for transitional needs. Will sign off when transition from hospital.   For questions,   Natividad Brood, RN BSN [place]  [phone] business mobile phone Toll free office [phone]  *[place]  [phone] Fax number: [phone] [email or web address]

## 2022-04-14 ENCOUNTER — Inpatient Hospital Stay (HOSPITAL_COMMUNITY): Payer: Medicare Other

## 2022-04-14 DIAGNOSIS — J95811 Postprocedural pneumothorax: Secondary | ICD-10-CM | POA: Diagnosis not present

## 2022-04-14 DIAGNOSIS — J189 Pneumonia, unspecified organism: Secondary | ICD-10-CM | POA: Diagnosis not present

## 2022-04-14 DIAGNOSIS — Z7189 Other specified counseling: Secondary | ICD-10-CM | POA: Diagnosis not present

## 2022-04-14 DIAGNOSIS — Z4682 Encounter for fitting and adjustment of non-vascular catheter: Secondary | ICD-10-CM | POA: Diagnosis not present

## 2022-04-14 DIAGNOSIS — J9601 Acute respiratory failure with hypoxia: Secondary | ICD-10-CM | POA: Diagnosis not present

## 2022-04-14 DIAGNOSIS — E43 Unspecified severe protein-calorie malnutrition: Secondary | ICD-10-CM | POA: Diagnosis not present

## 2022-04-14 LAB — CBC WITH DIFFERENTIAL/PLATELET
Abs Immature Granulocytes: 0.07 10*3/uL (ref 0.00–0.07)
Basophils Absolute: 0 10*3/uL (ref 0.0–0.1)
Basophils Relative: 0 %
Eosinophils Absolute: 0.1 10*3/uL (ref 0.0–0.5)
Eosinophils Relative: 1 %
HCT: 26 % — ABNORMAL LOW (ref 36.0–46.0)
Hemoglobin: 8.7 g/dL — ABNORMAL LOW (ref 12.0–15.0)
Immature Granulocytes: 1 %
Lymphocytes Relative: 6 %
Lymphs Abs: 0.9 10*3/uL (ref 0.7–4.0)
MCH: 30.1 pg (ref 26.0–34.0)
MCHC: 33.5 g/dL (ref 30.0–36.0)
MCV: 90 fL (ref 80.0–100.0)
Monocytes Absolute: 1.5 10*3/uL — ABNORMAL HIGH (ref 0.1–1.0)
Monocytes Relative: 10 %
Neutro Abs: 11.7 10*3/uL — ABNORMAL HIGH (ref 1.7–7.7)
Neutrophils Relative %: 82 %
Platelets: 285 10*3/uL (ref 150–400)
RBC: 2.89 MIL/uL — ABNORMAL LOW (ref 3.87–5.11)
RDW: 16 % — ABNORMAL HIGH (ref 11.5–15.5)
WBC: 14.3 10*3/uL — ABNORMAL HIGH (ref 4.0–10.5)
nRBC: 0 % (ref 0.0–0.2)

## 2022-04-14 LAB — CULTURE, BLOOD (ROUTINE X 2)
Culture: NO GROWTH
Culture: NO GROWTH
Culture: NO GROWTH
Culture: NO GROWTH
Special Requests: ADEQUATE
Special Requests: ADEQUATE
Special Requests: ADEQUATE

## 2022-04-14 LAB — HEPARIN LEVEL (UNFRACTIONATED)
Heparin Unfractionated: <0.1 IU/mL — ABNORMAL LOW (ref 0.30–0.70)
Heparin Unfractionated: 0.15 IU/mL — ABNORMAL LOW (ref 0.30–0.70)
Heparin Unfractionated: 0.17 IU/mL — ABNORMAL LOW (ref 0.30–0.70)

## 2022-04-14 LAB — BODY FLUID CULTURE W GRAM STAIN
Culture: NO GROWTH
Gram Stain: NONE SEEN

## 2022-04-14 NOTE — Progress Notes (Signed)
PROGRESS NOTE        PATIENT DETAILS Name: Julie Montgomery Age: 86 y.o. Sex: female Date of Birth: Feb 07, 1936 Admit Date: 04/08/2022 Admitting Physician Chesley Mires, MD MEQ:[id number], Evie Lacks, MD  Brief Summary: Patient is a 86 y.o.  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  Upon stability-transferred back to [facility] service on 12/14.    Significant events: 11/06-12/01>> hospitalized patient for C. difficile infection with toxic megacolon requiring colectomy/diverting ostomy.   12/10>> admit to [facility]-severe sepsis due to PNA. 12/11>> ultrasound-guided thoracocentesis by IR. 12/11>> hypotensive-transferred to ICU for pressors. 12/13>> left-sided chest tube placed 12/14>> transferred to [facility] 12/15>> family meeting with palliative care-Home hospice planned on discharge.DNR  Significant studies: 12/10>> CT abdomen/pelvis: Interval increase in large bilateral pleural effusions with lower lobe consolidation, subtotal colectomy/right-sided end ileostomy. 12/10>> CT angio chest: No PE, moderate bilateral pleural effusion, groundglass opacities right upper/middle/left upper lobes. 12/11>> echo: EF 60-65%  Significant microbiology data: 12/10>> COVID/influenza/RSV: Negative 12/10>>Urine culture: Enterococcus faecalis  12/10>> blood culture: No growth 12/11>> blood culture: No growth 12/11>> respiratory virus panel: Negative 12/11>> stool C. difficile: Negative 12/13>> pleural fluid culture: No growth  Procedures: 12/11>> thoracocentesis by IR 12/13>> chest tube  placement.  Consults: PCCM Cardiology Palliative care  Subjective: No major issues overnight-pleasantly confused-no family at bedside this morning.  Objective: Vitals: Blood pressure (!) 112/51, pulse 73, temperature 97.6 F (36.4 C), temperature source Oral, resp. rate 18, weight 46.8 kg, SpO2 98 %.   Exam: Gen Exam: Pleasantly confused-not in any distress HEENT:atraumatic, normocephalic Chest: B/L clear to auscultation anteriorly CVS:S1S2 regular Abdomen:soft non tender, non distended Extremities:++ edema Neurology: Non focal-generalized weakness Skin: no rash  Pertinent Labs/Radiology:    Latest Ref Rng & Units 04/14/2022    2:58 AM 04/13/2022    3:10 AM 04/12/2022    4:49 AM  CBC  WBC 4.0 - 10.5 K/uL 14.3  15.1  15.3   Hemoglobin 12.0 - 15.0 g/dL 8.7  8.0  9.0   Hematocrit 36.0 - 46.0 % 26.0  23.9  27.4   Platelets 150 - 400 K/uL 285  268  334     Lab Results  Component Value Date   NA 131 (L) 04/13/2022   K 4.1 04/13/2022   CL 93 (L) 04/13/2022   CO2 30 04/13/2022      Assessment/Plan: Septic shock due to aspiration PNA and complicated UTI Sepsis physiology has resolved Completed 5 days of Zosyn BP stable but on midodrine.  Acute hypoxic respiratory failure due to probable aspiration pneumonia and large bilateral pleural effusion Currently stable on 2-3 L of oxygen Antibiotics as above-chest tube in place for pleural effusion Wean down oxygen as tolerated  Bilateral pleural effusion Loculated left pleural effusion-s/p pigtail catheter placement on 12/13 Iatrogenic apical pneumothorax following chest tube placement PCCM following-defer chest tube management to PCCM service.  Chronic atrial fibrillation Continue amiodarone/IV heparin (transition to DOAC when chest tube out) Monitor in telemetry.  History of recurrent C. difficile infection-with toxic megacolon requiring colectomy/ostomy 11/22 C. difficile studies negative Minimize antibiotics as  much as possible On  oral vancomycin  Hyponatremia Mild Due to hypervolemia is volume overloaded Continue Bumex  Dementia Pleasantly confused Maintain delirium precautions Continue Namenda  Anasarca Likely due to hypoalbuminemia Doubt CHF, UA negative for protein. Optimize nutrition status is much as possible Gentle diuretics  Palliative care Severe failure to thrive syndrome Multiple comorbid issues as outlined above-very frail/cachectic-recent frequent hospitalizations After extensive goals of care discussion by palliative care team-now DNR-family contemplating getting patient home with hospice care For now-continue O2 for comfort-will leave Foley catheter in for comfort until further goals of care discussion Will await further recommendations  Pressure Ulcer: Pressure Injury 03/09/22 Sacrum Mid Unstageable - Full thickness tissue loss in which the base of the injury is covered by slough (yellow, tan, gray, green or brown) and/or eschar (tan, brown or black) in the wound bed. (Active)  03/09/22 2000  Location: Sacrum  Location Orientation: Mid  Staging: Unstageable - Full thickness tissue loss in which the base of the injury is covered by slough (yellow, tan, gray, green or brown) and/or eschar (tan, brown or black) in the wound bed.  Wound Description (Comments):   Present on Admission: Yes  Dressing Type Foam - Lift dressing to assess site every shift 04/14/22 0800     Pressure Injury 03/29/22 Heel Right Deep Tissue Pressure Injury - Purple or maroon localized area of discolored intact skin or blood-filled blister due to damage of underlying soft tissue from pressure and/or shear. (Active)  03/29/22 2003  Location: Heel  Location Orientation: Right  Staging: Deep Tissue Pressure Injury - Purple or maroon localized area of discolored intact skin or blood-filled blister due to damage of underlying soft tissue from pressure and/or shear.  Wound Description (Comments):    Present on Admission:   Dressing Type Foam - Lift dressing to assess site every shift 04/14/22 0800     Pressure Injury 04/09/22 Heel Left Deep Tissue Pressure Injury - Purple or maroon localized area of discolored intact skin or blood-filled blister due to damage of underlying soft tissue from pressure and/or shear. (Active)  04/09/22 1836  Location: Heel  Location Orientation: Left  Staging: Deep Tissue Pressure Injury - Purple or maroon localized area of discolored intact skin or blood-filled blister due to damage of underlying soft tissue from pressure and/or shear.  Wound Description (Comments):   Present on Admission: Yes  Dressing Type Foam - Lift dressing to assess site every shift 04/14/22 0800   Underweight: Estimated body mass index is 18.87 kg/m as calculated from the following:   Height as of 03/05/22: '5\' 2"'$  (1.575 m).   Weight as of this encounter: 46.8 kg.   Code status:   Code Status: DNR   DVT Prophylaxis: IV heparin   Family Communication: Spouse at bedside   Disposition Plan: Status is: Inpatient Remains inpatient appropriate because: Severity of illness   Planned Discharge Destination: Home with hospice over the next few days.   Diet: Diet Order             Diet regular Room service appropriate? Yes; Fluid consistency: Thin  Diet effective now                     Antimicrobial agents: Anti-infectives (From admission, onward)    Start     Dose/Rate Route Frequency Ordered Stop   04/10/22 1900  vancomycin (VANCOCIN) IVPB 1000 mg/200 mL premix  Status:  Discontinued        1,000 mg 200 mL/hr over 60 Minutes Intravenous Every 36  hours 04/09/22 0756 04/10/22 0839   04/10/22 0930  piperacillin-tazobactam (ZOSYN) IVPB 3.375 g        3.375 g 12.5 mL/hr over 240 Minutes Intravenous Every 8 hours 04/10/22 0843 04/14/22 0100   04/09/22 1800  azithromycin (ZITHROMAX) 500 mg in sodium chloride 0.9 % 250 mL IVPB  Status:  Discontinued        500  mg 250 mL/hr over 60 Minutes Intravenous Every 24 hours 04/09/22 0401 04/10/22 0839   04/09/22 1000  ceFEPIme (MAXIPIME) 2 g in sodium chloride 0.9 % 100 mL IVPB  Status:  Discontinued       Note to Pharmacy: Pharmacy to dose   2 g 200 mL/hr over 30 Minutes Intravenous 2 times daily 04/09/22 0401 04/10/22 0839   04/09/22 1000  vancomycin (VANCOCIN) capsule 125 mg        125 mg Oral 2 times daily 04/09/22 0401 04/18/22 2359   04/09/22 0600  ceFEPIme (MAXIPIME) 2 g in sodium chloride 0.9 % 100 mL IVPB  Status:  Discontinued        2 g 200 mL/hr over 30 Minutes Intravenous Every 8 hours 04/09/22 0401 04/09/22 0405   04/09/22 0430  vancomycin (VANCOCIN) IVPB 1000 mg/200 mL premix        1,000 mg 200 mL/hr over 60 Minutes Intravenous  Once 04/09/22 0424 04/09/22 0725   04/08/22 2315  ceFEPIme (MAXIPIME) 2 g in sodium chloride 0.9 % 100 mL IVPB        2 g 200 mL/hr over 30 Minutes Intravenous  Once 04/08/22 2309 04/08/22 2352   04/08/22 1945  cefTRIAXone (ROCEPHIN) 1 g in sodium chloride 0.9 % 100 mL IVPB  Status:  Discontinued        1 g 200 mL/hr over 30 Minutes Intravenous  Once 04/08/22 1937 04/08/22 2121   04/08/22 1945  azithromycin (ZITHROMAX) 500 mg in sodium chloride 0.9 % 250 mL IVPB  Status:  Discontinued        500 mg 250 mL/hr over 60 Minutes Intravenous  Once 04/08/22 1937 04/08/22 2121        MEDICATIONS: Scheduled Meds:  amiodarone  200 mg Oral Daily   bumetanide  0.5 mg Oral BID   Chlorhexidine Gluconate Cloth  6 each Topical Daily   feeding supplement  237 mL Oral BID BM   leptospermum manuka honey  1 Application Topical Daily   memantine  5 mg Oral BID   midodrine  10 mg Oral TID WC   sodium chloride flush  10 mL Intrapleural Q8H   vancomycin  125 mg Oral BID   Continuous Infusions:  sodium chloride Stopped (04/11/22 1414)   heparin 1,200 Units/hr (04/14/22 0815)   PRN Meds:.sodium chloride, oxyCODONE   I have personally reviewed following labs and imaging  studies  LABORATORY DATA: CBC: Recent Labs  Lab 04/10/22 0342 04/11/22 0307 04/12/22 0449 04/13/22 0310 04/14/22 0258  WBC 11.2* 12.8* 15.3* 15.1* 14.3*  NEUTROABS 9.5* 10.8* 13.4* 12.8* 11.7*  HGB 8.3* 8.3* 9.0* 8.0* 8.7*  HCT 25.1* 24.5* 27.4* 23.9* 26.0*  MCV 91.6 89.4 91.6 89.8 90.0  PLT 331 328 334 268 285     Basic Metabolic Panel: Recent Labs  Lab 04/09/22 2023 04/10/22 0342 04/11/22 0307 04/12/22 0449 04/13/22 0310  NA 134* 135 130* 129* 131*  K 4.1 3.7 3.4* 4.3 4.1  CL 101 100 95* 92* 93*  CO2 '25 27 27 25 30  '$ GLUCOSE 100* 89 106* 87 112*  BUN 15  $'11 14 13 14  'X$ CREATININE 0.50 0.42* 0.49 0.54 0.48  CALCIUM 9.2 8.8* 8.9 9.3 8.7*     GFR: Estimated Creatinine Clearance: 38.7 mL/min (by C-G formula based on SCr of 0.48 mg/dL).  Liver Function Tests: Recent Labs  Lab 04/08/22 1825  AST 43*  ALT 50*  ALKPHOS 176*  BILITOT 0.3  PROT 7.0  ALBUMIN 3.8    Recent Labs  Lab 04/08/22 1825  LIPASE 30    No results for input(s): "AMMONIA" in the last 168 hours.  Coagulation Profile: Recent Labs  Lab 04/08/22 1825  INR 1.1     Cardiac Enzymes: No results for input(s): "CKTOTAL", "CKMB", "CKMBINDEX", "TROPONINI" in the last 168 hours.  BNP (last 3 results) Recent Labs    01/26/22 1447  PROBNP 68.0     Lipid Profile: No results for input(s): "CHOL", "HDL", "LDLCALC", "TRIG", "CHOLHDL", "LDLDIRECT" in the last 72 hours.  Thyroid Function Tests: No results for input(s): "TSH", "T4TOTAL", "FREET4", "T3FREE", "THYROIDAB" in the last 72 hours.  Anemia Panel: No results for input(s): "VITAMINB12", "FOLATE", "FERRITIN", "TIBC", "IRON", "RETICCTPCT" in the last 72 hours.  Urine analysis:    Component Value Date/Time   COLORURINE STRAW (A) 04/09/2022 0430   APPEARANCEUR CLEAR 04/09/2022 0430   LABSPEC 1.025 04/09/2022 0430   PHURINE 5.0 04/09/2022 0430   GLUCOSEU NEGATIVE 04/09/2022 0430   GLUCOSEU NEGATIVE 04/05/2021 1236   HGBUR  NEGATIVE 04/09/2022 0430   BILIRUBINUR NEGATIVE 04/09/2022 0430   KETONESUR NEGATIVE 04/09/2022 0430   PROTEINUR NEGATIVE 04/09/2022 0430   UROBILINOGEN 0.2 04/05/2021 1236   NITRITE NEGATIVE 04/09/2022 0430   LEUKOCYTESUR NEGATIVE 04/09/2022 0430    Sepsis Labs: Lactic Acid, Venous    Component Value Date/Time   LATICACIDVEN 0.9 04/09/2022 2000    MICROBIOLOGY: Recent Results (from the past 240 hour(s))  Resp panel by RT-PCR (RSV, Flu A&B, Covid) Anterior Nasal Swab     Status: None   Collection Time: 04/08/22  6:05 PM   Specimen: Anterior Nasal Swab  Result Value Ref Range Status   SARS Coronavirus 2 by RT PCR NEGATIVE NEGATIVE Final    Comment: (NOTE) SARS-CoV-2 target nucleic acids are NOT DETECTED.  The SARS-CoV-2 RNA is generally detectable in upper respiratory specimens during the acute phase of infection. The lowest concentration of SARS-CoV-2 viral copies this assay can detect is 138 copies/mL. A negative result does not preclude SARS-Cov-2 infection and should not be used as the sole basis for treatment or other patient management decisions. A negative result may occur with  improper specimen collection/handling, submission of specimen other than nasopharyngeal swab, presence of viral mutation(s) within the areas targeted by this assay, and inadequate number of viral copies(<138 copies/mL). A negative result must be combined with clinical observations, patient history, and epidemiological information. The expected result is Negative.  Fact Sheet for Patients:  EntrepreneurPulse.com.au  Fact Sheet for Healthcare Providers:  IncredibleEmployment.be  This test is no t yet approved or cleared by the [place] FDA and  has been authorized for detection and/or diagnosis of SARS-CoV-2 by FDA under an Emergency Use Authorization (EUA). This EUA will remain  in effect (meaning this test can be used) for the duration of  the COVID-19 declaration under Section 564(b)(1) of the Act, 21 U.S.C.section 360bbb-3(b)(1), unless the authorization is terminated  or revoked sooner.       Influenza A by PCR NEGATIVE NEGATIVE Final   Influenza B by PCR NEGATIVE NEGATIVE Final    Comment: (NOTE) The Xpert  Xpress SARS-CoV-2/FLU/RSV plus assay is intended as an aid in the diagnosis of influenza from Nasopharyngeal swab specimens and should not be used as a sole basis for treatment. Nasal washings and aspirates are unacceptable for Xpert Xpress SARS-CoV-2/FLU/RSV testing.  Fact Sheet for Patients: EntrepreneurPulse.com.au  Fact Sheet for Healthcare Providers: IncredibleEmployment.be  This test is not yet approved or cleared by the [place] FDA and has been authorized for detection and/or diagnosis of SARS-CoV-2 by FDA under an Emergency Use Authorization (EUA). This EUA will remain in effect (meaning this test can be used) for the duration of the COVID-19 declaration under Section 564(b)(1) of the Act, 21 U.S.C. section 360bbb-3(b)(1), unless the authorization is terminated or revoked.     Resp Syncytial Virus by PCR NEGATIVE NEGATIVE Final    Comment: (NOTE) Fact Sheet for Patients: EntrepreneurPulse.com.au  Fact Sheet for Healthcare Providers: IncredibleEmployment.be  This test is not yet approved or cleared by the [place] FDA and has been authorized for detection and/or diagnosis of SARS-CoV-2 by FDA under an Emergency Use Authorization (EUA). This EUA will remain in effect (meaning this test can be used) for the duration of the COVID-19 declaration under Section 564(b)(1) of the Act, 21 U.S.C. section 360bbb-3(b)(1), unless the authorization is terminated or revoked.  Performed at [facility], [address], [place], [place] [address]   Blood Culture (routine x 2)     Status: None   Collection  Time: 04/08/22  6:25 PM   Specimen: BLOOD  Result Value Ref Range Status   Specimen Description   Final    BLOOD RIGHT ANTECUBITAL Performed at [facility], [address], [place], [place] [address]    Special Requests   Final    BOTTLES DRAWN AEROBIC AND ANAEROBIC Blood Culture adequate volume Performed at [facility], [address], [place], [place] [address]    Culture   Final    NO GROWTH 5 DAYS Performed at [facility], [place] [address], [place], [place] [address]    Report Status 04/14/2022 FINAL  Final  Blood Culture (routine x 2)     Status: None   Collection Time: 04/08/22  6:25 PM   Specimen: BLOOD  Result Value Ref Range Status   Specimen Description   Final    BLOOD LEFT ANTECUBITAL Performed at [facility], [address] [place], [place] [address]    Special Requests   Final    BOTTLES DRAWN AEROBIC AND ANAEROBIC Blood Culture adequate volume Performed at [facility], [address], [place], [place] [address]    Culture   Final    NO GROWTH 5 DAYS Performed at [facility], [place] [address], [place], [place] [address]    Report Status 04/14/2022 FINAL  Final  Urine Culture     Status: Abnormal   Collection Time: 04/08/22 10:24 PM   Specimen: In/Out Cath Urine  Result Value Ref Range Status   Specimen Description   Final    IN/OUT CATH URINE Performed at [facility], [address] [place],  [address]    Special Requests   Final    NONE Performed at [facility], [place], [place] [address]    Culture 80,000 COLONIES/mL ENTEROCOCCUS FAECALIS (A)  Final   Report Status 04/11/2022 FINAL  Final   Organism ID, Bacteria ENTEROCOCCUS FAECALIS (A)  Final      Susceptibility   Enterococcus faecalis - MIC*  AMPICILLIN <=2 SENSITIVE Sensitive     NITROFURANTOIN <=16 SENSITIVE Sensitive     VANCOMYCIN 1  SENSITIVE Sensitive     * 80,000 COLONIES/mL ENTEROCOCCUS FAECALIS  Respiratory (~20 pathogens) panel by PCR     Status: None   Collection Time: 04/09/22  4:30 AM   Specimen: Nasopharyngeal Swab; Respiratory  Result Value Ref Range Status   Adenovirus NOT DETECTED NOT DETECTED Final   Coronavirus 229E NOT DETECTED NOT DETECTED Final    Comment: (NOTE) The Coronavirus on the Respiratory Panel, DOES NOT test for the novel  Coronavirus (2019 nCoV)    Coronavirus HKU1 NOT DETECTED NOT DETECTED Final   Coronavirus NL63 NOT DETECTED NOT DETECTED Final   Coronavirus OC43 NOT DETECTED NOT DETECTED Final   Metapneumovirus NOT DETECTED NOT DETECTED Final   Rhinovirus / Enterovirus NOT DETECTED NOT DETECTED Final   Influenza A NOT DETECTED NOT DETECTED Final   Influenza B NOT DETECTED NOT DETECTED Final   Parainfluenza Virus 1 NOT DETECTED NOT DETECTED Final   Parainfluenza Virus 2 NOT DETECTED NOT DETECTED Final   Parainfluenza Virus 3 NOT DETECTED NOT DETECTED Final   Parainfluenza Virus 4 NOT DETECTED NOT DETECTED Final   Respiratory Syncytial Virus NOT DETECTED NOT DETECTED Final   Bordetella pertussis NOT DETECTED NOT DETECTED Final   Bordetella Parapertussis NOT DETECTED NOT DETECTED Final   Chlamydophila pneumoniae NOT DETECTED NOT DETECTED Final   Mycoplasma pneumoniae NOT DETECTED NOT DETECTED Final    Comment: Performed at [facility], [place]. [address], [place], [place] [address]  Culture, blood (Routine X 2) w Reflex to ID Panel     Status: None   Collection Time: 04/09/22  4:30 AM   Specimen: BLOOD RIGHT FOREARM  Result Value Ref Range Status   Specimen Description BLOOD RIGHT FOREARM  Final   Special Requests   Final    BOTTLES DRAWN AEROBIC AND ANAEROBIC Blood Culture results may not be optimal due to an inadequate volume of blood received in culture bottles   Culture   Final    NO GROWTH 5 DAYS Performed at [facility], [place] [address], [place], [place]  [address]    Report Status 04/14/2022 FINAL  Final  Culture, blood (Routine X 2) w Reflex to ID Panel     Status: None   Collection Time: 04/09/22  4:35 AM   Specimen: BLOOD  Result Value Ref Range Status   Specimen Description BLOOD LEFT ANTECUBITAL  Final   Special Requests   Final    BOTTLES DRAWN AEROBIC AND ANAEROBIC Blood Culture adequate volume   Culture   Final    NO GROWTH 5 DAYS Performed at [facility], [place] [address], [place], [place] [address]    Report Status 04/14/2022 FINAL  Final  C Difficile Quick Screen w PCR reflex     Status: None   Collection Time: 04/09/22  6:14 PM  Result Value Ref Range Status   C Diff antigen NEGATIVE NEGATIVE Final   C Diff toxin NEGATIVE NEGATIVE Final   C Diff interpretation No C. difficile detected.  Final    Comment: Performed at [facility], [place] [address] [place], [place] [address]  MRSA Next Gen by PCR, Nasal     Status: None   Collection Time: 04/09/22  6:16 PM   Specimen: Nasal Mucosa; Nasal Swab  Result Value Ref Range Status   MRSA by PCR Next Gen NOT DETECTED NOT DETECTED Final  Comment: (NOTE) The GeneXpert MRSA Assay (FDA approved for NASAL specimens only), is one component of a comprehensive MRSA colonization surveillance program. It is not intended to diagnose MRSA infection nor to guide or monitor treatment for MRSA infections. Test performance is not FDA approved in patients less than 19 years old. Performed at [facility], [place] [address] [place], [place] [address]   Body fluid culture w Gram Stain     Status: None   Collection Time: 04/11/22  2:08 PM   Specimen: Thoracentesis; Body Fluid  Result Value Ref Range Status   Specimen Description THORACENTESIS  Final   Special Requests NONE  Final   Gram Stain NO WBC SEEN NO ORGANISMS SEEN   Final   Culture   Final    NO GROWTH 3 DAYS Performed at [facility], [address], [place], [place] [address]    Report Status 04/14/2022  FINAL  Final    RADIOLOGY STUDIES/RESULTS: DG CHEST PORT 1 VIEW  Result Date: 04/14/2022 CLINICAL DATA:  Chest tube. EXAM: PORTABLE CHEST 1 VIEW COMPARISON:  04/12/2022 FINDINGS: Left-sided pigtail pleural drainage catheter unchanged. Lungs are otherwise adequately inflated as previously seen small left apical pneumothorax not well visualized on today's exam. Subtle hazy opacification over the left perihilar region and left base likely small left effusion and possible mild vascular congestion. Patchy airspace opacification over the right lung slightly worse and may be due to multifocal infection. Suggestion of small right pleural effusion. Cardiomediastinal silhouette and remainder of the exam is unchanged. IMPRESSION: 1. Patchy airspace opacification over the right lung slightly worse and may be due to multifocal infection. Suggestion of small right pleural effusion. 2. Stable left-sided pigtail pleural drainage catheter. Previously seen small left apical pneumothorax not visualized on today's exam. 3. Suggestion of mild vascular congestion as well as small left effusion/basilar atelectasis. Electronically Signed   By: Marin Olp M.D.   On: 04/14/2022 09:57   DG CHEST PORT 1 VIEW  Result Date: 04/12/2022 CLINICAL DATA:  Chest tube. EXAM: PORTABLE CHEST 1 VIEW COMPARISON:  Same day. FINDINGS: Stable cardiomediastinal silhouette. Stable position of left-sided chest tube. Stable small left apical pneumothorax. Stable bilateral lung opacities. IMPRESSION: Stable position of left-sided chest tube. Stable small left apical pneumothorax. Stable bilateral lung opacities. Electronically Signed   By: Marijo Conception M.D.   On: 04/12/2022 15:29   DG Chest Port 1 View  Result Date: 04/12/2022 CLINICAL DATA:  Shortness of breath, pneumothorax. EXAM: PORTABLE CHEST 1 VIEW COMPARISON:  Same day. FINDINGS: Stable cardiomediastinal silhouette. Stable small left apical pneumothorax is noted. Stable bilateral lung  opacities are noted with associated pleural effusions. Left-sided chest tube is unchanged. IMPRESSION: Stable position of left-sided chest tube. Stable small left apical pneumothorax. Stable bilateral lung opacities and pleural effusions. Electronically Signed   By: Marijo Conception M.D.   On: 04/12/2022 13:37     LOS: 5 days   Oren Binet, MD  [facility]    To contact the attending provider between 7A-7P or the covering provider during after hours 7P-7A, please log into the web site [email or web address] and access using universal [employer] password for that web site. If you do not have the password, please call the hospital operator.  04/14/2022, 10:33 AM

## 2022-04-14 NOTE — TOC Progression Note (Signed)
Transition of Care [facility]) - Progression Note    Patient Details  Name: Julie Montgomery MRN: [id number] Date of Birth: [birth year]  Transition of Care [facility]) CM/SW Contact  Carles Collet, RN Phone Number: 04/14/2022, 3:47 PM  Clinical Narrative:    Damaris Schooner e MD and palliative, anticipate DC to home w [facility] tomorrow if patient olerates removal of chest tube well overnight and DME is delivered to the house.  Spoke w Tharon Aquas of [facility].  Anticipate DME to be delivered tomorrow, bed, table, O2. Frankie to set up hospice nurse for intake either tomorrow or Monday, hopefully tomorrow but she will need to check for availability.  Dc to home w [facility] hosp[facility] vis [facility] once DME is delivered.  Expected Discharge Plan: Home w Hospice Care Barriers to Discharge: Continued Medical Work up  Expected Discharge Plan and Services Expected Discharge Plan: [place]   Discharge Planning Services: CM Consult   Living arrangements for the past 2 months: [place] Agency: Other - See comment ([facility]) Date [facility] Agency Contacted: 04/12/22 Time [place]: 1620 Representative spoke with at [place]: [place] ([place]) Interventions    Readmission Risk Interventions    03/08/2022    3:28 PM 02/27/2022   12:23 PM 02/12/2022    9:29 AM  Readmission Risk Prevention Plan  Transportation Screening Complete Complete Complete  PCP or Specialist Appt within 3-5 Days  Complete Complete  HRI or [place]  Complete Complete  Social Work Consult for [place] Planning/Counseling  Complete Complete  Palliative Care Screening  Not Applicable Not Applicable  Medication Review [employer]) Complete Complete Complete  HRI or Home Care Consult Complete    SW Recovery Care/Counseling Consult Complete    Palliative Care Screening Complete    [place] Not Applicable

## 2022-04-14 NOTE — Progress Notes (Signed)
ANTICOAGULATION CONSULT NOTE-Follow Up  Pharmacy Consult for heparin Indication: atrial fibrillation  Allergies  Allergen Reactions   Lactose Intolerance (Gi) Other (See Comments)    Intolerance    Lovastatin Other (See Comments)    Unknown reaction   Mometasone Furo-Formoterol Fum Other (See Comments)    Loss of appetite, laryngitis    Peanut-Containing Drug Products Other (See Comments)   Prozac [Fluoxetine Hcl] Other (See Comments)    Jumpy   Sulfa Antibiotics Other (See Comments)    Unknown reaction    Patient Measurements: Weight: 46.8 kg (103 lb 2.8 oz) Heparin Dosing Weight: 46.8 kg (TBW)  Vital Signs: Temp: 97.8 F (36.6 C) (12/16 1200) Temp Source: Oral (12/16 1200) BP: 124/54 (12/16 1200) Pulse Rate: 62 (12/16 1325)  Labs: Recent Labs    04/12/22 0449 04/12/22 0450 04/12/22 1557 04/13/22 0310 04/13/22 1357 04/13/22 2243 04/14/22 0258 04/14/22 1114  HGB 9.0*  --   --  8.0*  --   --  8.7*  --   HCT 27.4*  --   --  23.9*  --   --  26.0*  --   PLT 334  --   --  268  --   --  285  --   APTT  --  21* 47* 66*  --   --   --   --   HEPARINUNFRC  --   --   --  0.19*   < > <0.10* 0.15* 0.17*  CREATININE 0.54  --   --  0.48  --   --   --   --    < > = values in this interval not displayed.     Estimated Creatinine Clearance: 38.7 mL/min (by C-G formula based on SCr of 0.48 mg/dL).   Medical History: Past Medical History:  Diagnosis Date   Adenomatous polyp of colon 2020   Allergy    Anxiety    Basal cell carcinoma (BCC)    Cataract    Depression    Helicobacter pylori gastritis    Osteopenia    Paroxysmal atrial fibrillation (HCC)    Pneumonia    hx of 03/2008   Stroke [facility])    TIA long time ago    Medications:  Infusions:   sodium chloride Stopped (04/11/22 1414)   heparin 1,200 Units/hr (04/14/22 0815)    Assessment: Julie Montgomery is an 86 yo female admitted on 04/08/2022 with aspiration PNA. Patient had pleural catheter inserted 12/13 and pharmacy  consulted for heparin dosing for A-fib. Patient was on Eliquis prior to admission and has been on Eliquis inpatient. Last Eliquis dose 04/11/2022 at 1045. Expect heparin levels to be falsely elevated from Eliquis dose this morning (12/13). Will use aPTT for drug monitoring until aPTT and anti-Xa correlate. Hgb 8.3 and stable, plt 328. Baseline aPTT of 33 on 04/08/2022.  12/16 PM update:  Heparin level sub-therapeutic at 0.17. No issues with the infusion or signs/symptoms of bleeding per RN  Goal of Therapy:  Heparin level 0.3-0.7 units/mL Monitor platelets by anticoagulation protocol: Yes   Plan:  Inc heparin to 1300 units/hr Re-check heparin level in 8 hours Monitor CBC and heparin level daily Monitor for signs/symptoms of bleeding  Sandford Craze, PharmD. [facility] Acute Care PGY-1 04/14/2022 2:41 PM

## 2022-04-14 NOTE — Discharge Summary (Incomplete)
PATIENT DETAILS Name: Julie Montgomery Age: 86 y.o. Sex: female Date of Birth: 12/18/1935 MRN: [id number]. Admitting Physician: Chesley Mires, MD JKD:[id number], Evie Lacks, MD  Admit Date: 04/08/2022 Discharge date: 04/15/2022  Recommendations for Outpatient Follow-up:  Follow up with PCP in 1-2 weeks if desired Optimize comfort measures Slowly discontinue unnecessary medications as clinical course evolves.  Admitted From:  Home with Hospice  Disposition: Hospice care   Discharge Condition: poor  CODE STATUS:   Code Status: DNR   Diet recommendation:  Diet Order             Diet regular Room service appropriate? Yes; Fluid consistency: Thin  Diet effective now                    Brief Summary: Patient is a 86 y.o.  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  Upon stability-transferred back to [facility] service on 12/14.     Significant events: 11/06-12/01>> hospitalized patient for C. difficile infection with toxic megacolon requiring colectomy/diverting ostomy.   12/10>> admit to [facility]-severe sepsis due to PNA. 12/11>> ultrasound-guided thoracocentesis by IR. 12/11>> hypotensive-transferred to ICU for pressors. 12/13>> left-sided chest tube placed 12/14>> transferred to [facility] 12/15>> family meeting with palliative care-Home hospice planned on discharge.DNR placed.   Significant studies: 12/10>> CT abdomen/pelvis: Interval increase in large bilateral pleural effusions with lower lobe consolidation, subtotal colectomy/right-sided end ileostomy. 12/10>> CT angio chest: No PE, moderate bilateral pleural effusion,  groundglass opacities right upper/middle/left upper lobes. 12/11>> echo: EF 60-65%   Significant microbiology data: 12/10>> COVID/influenza/RSV: Negative 12/10>>Urine culture: Enterococcus faecalis  12/10>> blood culture: No growth 12/11>> blood culture: No growth 12/11>> respiratory virus panel: Negative 12/11>> stool C. difficile: Negative 12/13>> pleural fluid culture: No growth   Procedures: 12/11>> thoracocentesis by IR 12/13>> chest tube placement.   Consults: PCCM Cardiology Palliative care  Brief Hospital Course: Septic shock due to aspiration PNA and complicated UTI Sepsis physiology has resolved Completed 5 days of Zosyn BP stable but on midodrine.   Acute hypoxic respiratory failure due to probable aspiration pneumonia and large bilateral pleural effusion Antibiotics as above-chest tube in place for pleural effusion On minimal amount of oxygen for comfort.   Bilateral pleural effusion Loculated left pleural effusion-s/p pigtail catheter placement on 12/13 Iatrogenic apical pneumothorax following chest tube placement PCCM followed closely Chest tube to be removed 12/16, home hospice on 12/17   Chronic atrial fibrillation Continue amiodarone Was on IV heparin for chest tube placement-will be switched to Eliquis on discharge. Hospice MD/PCP to discontinue amiodarone/Eliquis over the course of the next several days/weeks as clinical course evolves while on hospice care.   History of recurrent C. difficile infection-with toxic megacolon requiring colectomy/ostomy 11/22 C. difficile studies negative Minimize antibiotics as much as possible On oral vancomycin-stop date 12/20   Hyponatremia Mild Due to hypervolemia is volume overloaded Continue Bumex for comfort-can discontinue as patient deteriorates/evolves clinically.   Dementia Pleasantly confused Maintain delirium precautions Continue Namenda for now-can discontinue based on how she does with hospice  care at home.   Anasarca Likely due to hypoalbuminemia Doubt CHF, UA negative for protein. Optimize nutrition status is much as possible Gentle diuretics   Palliative care Severe failure to  thrive syndrome Multiple comorbid issues as outlined above-very frail/cachectic-recent frequent hospitalizations After extensive goals of care discussion by palliative care team-now DNR-family agreeable with hospice care at home.   For now continuing most of her medications-but as she progresses with hospice care-most of her medications probably could be slowly discontinued.    Pressure Ulcer: Pressure Injury 03/09/22 Sacrum Mid Unstageable - Full thickness tissue loss in which the base of the injury is covered by slough (yellow, tan, gray, green or brown) and/or eschar (tan, brown or black) in the wound bed. (Active)  03/09/22 2000  Location: Sacrum  Location Orientation: Mid  Staging: Unstageable - Full thickness tissue loss in which the base of the injury is covered by slough (yellow, tan, gray, green or brown) and/or eschar (tan, brown or black) in the wound bed.  Wound Description (Comments):   Present on Admission: Yes  Dressing Type Foam - Lift dressing to assess site every shift 04/15/22 0800     Pressure Injury 03/29/22 Heel Right Deep Tissue Pressure Injury - Purple or maroon localized area of discolored intact skin or blood-filled blister due to damage of underlying soft tissue from pressure and/or shear. (Active)  03/29/22 2003  Location: Heel  Location Orientation: Right  Staging: Deep Tissue Pressure Injury - Purple or maroon localized area of discolored intact skin or blood-filled blister due to damage of underlying soft tissue from pressure and/or shear.  Wound Description (Comments):   Present on Admission:   Dressing Type Foam - Lift dressing to assess site every shift 04/15/22 0800     Pressure Injury 04/09/22 Heel Left Deep Tissue Pressure Injury - Purple or maroon localized  area of discolored intact skin or blood-filled blister due to damage of underlying soft tissue from pressure and/or shear. (Active)  04/09/22 1836  Location: Heel  Location Orientation: Left  Staging: Deep Tissue Pressure Injury - Purple or maroon localized area of discolored intact skin or blood-filled blister due to damage of underlying soft tissue from pressure and/or shear.  Wound Description (Comments):   Present on Admission: Yes  Dressing Type Foam - Lift dressing to assess site every shift 04/15/22 0800   Underweight: Estimated body mass index is 18.87 kg/m as calculated from the following:   Height as of 03/05/22: '5\' 2"'$  (1.575 m).   Weight as of this encounter: 46.8 kg   Discharge Diagnoses:  Principal Problem:   HCAP (healthcare-associated pneumonia) Active Problems:   Coronary atherosclerosis due to calcified coronary lesion   Long term current use of antiarrhythmic drug   Atherosclerosis of aorta (HCC)   Severe protein-energy malnutrition (HCC)   Septic shock (HCC)   Elevated brain natriuretic peptide (BNP) level   Loculated pleural effusion   Status post thoracentesis   Encounter for long-term (current) use of antibiotics   Acute respiratory failure with hypoxia (HCC)   Hypotension   Physical deconditioning   Discharge Instructions:  Activity:  As tolerated with Full fall precautions use walker/cane & assistance as needed  Discharge Instructions     Discharge instructions   Complete by: As directed    Disposition.  Residential hospice Condition.  Guarded CODE STATUS.  DNR Activity.  With assistance as tolerated, full fall precautions. Diet.  Soft with feeding assistance and aspiration precautions. Goal of care.  Comfort.   Discharge wound care:   Complete by: As directed    03/09/22 Sacrum Mid Unstageable - Full thickness tissue loss in which the base of the injury is covered by slough (yellow,  tan, gray, green or brown) and/or eschar (tan, brown or  black) in the wound bed. 36 days    Pressure Injury 03/29/22 Heel Right Deep Tissue Pressure Injury - Purple or maroon localized area of discolored intact skin or blood-filled blister due to damage of underlying soft tissue from pressure and/or shear. 16 days. Every shift      Comments: Wound care to sacral Unstageable pressure injury:  Cleanse with NS, pat dry. Apply thin layer of MediHoney to wound, top with dry gauze and secure with silicone foam for sacrum. Change daily. Turn and position off of the supine position.    Pressure Injury 04/09/22 Heel Left Deep Tissue Pressure Injury - Purple or maroon localized area of discolored intact skin or blood-filled blister due to damage of underlying soft tissue from pressure and/or shear - Daily      Allergies as of 04/15/2022       Reactions   Lactose Intolerance (gi) Other (See Comments)   Intolerance    Lovastatin Other (See Comments)   Unknown reaction   Mometasone Furo-formoterol Fum Other (See Comments)   Loss of appetite, laryngitis    Peanut-containing Drug Products Other (See Comments)   Prozac [fluoxetine Hcl] Other (See Comments)   Jumpy   Sulfa Antibiotics Other (See Comments)   Unknown reaction        Medication List     STOP taking these medications    Cartia XT 180 MG 24 hr capsule Generic drug: diltiazem   loperamide 2 MG capsule Commonly known as: IMODIUM   polycarbophil 625 MG tablet Commonly known as: FIBERCON   Vitamin D (Ergocalciferol) 1.25 MG (50000 UNIT) Caps capsule Commonly known as: DRISDOL   ZINC PO       TAKE these medications    Align 4 MG Caps Take 1 capsule (4 mg total) by mouth daily.   alum & mag hydroxide-simeth 200-200-20 MG/5ML suspension Commonly known as: MAALOX/MYLANTA Take 30 mLs by mouth every 6 (six) hours as needed for indigestion or heartburn.   amiodarone 200 MG tablet Commonly known as: PACERONE Take 1 tablet (200 mg total) by mouth daily.   bumetanide 0.5 MG  tablet Commonly known as: BUMEX Take 1 tablet (0.5 mg total) by mouth 2 (two) times daily.   Eliquis 2.5 MG Tabs tablet Generic drug: apixaban Take 1 tablet (2.5 mg total) by mouth 2 (two) times daily.   LORazepam 0.5 MG tablet Commonly known as: ATIVAN Take 1 tablet (0.5 mg total) by mouth at bedtime as needed for anxiety or sleep.   memantine 5 MG tablet Commonly known as: NAMENDA Take 5 mg by mouth 2 (two) times daily.   midodrine 10 MG tablet Commonly known as: PROAMATINE Take 1 tablet (10 mg total) by mouth 3 (three) times daily with meals.   oxyCODONE 5 MG immediate release tablet Commonly known as: Oxy IR/ROXICODONE Take 1 tablet (5 mg total) by mouth every 6 (six) hours as needed for moderate pain.   saccharomyces boulardii 250 MG capsule Commonly known as: FLORASTOR Take 1 capsule (250 mg total) by mouth 2 (two) times daily.   vancomycin 125 MG capsule Commonly known as: VANCOCIN Take 1 capsule (125 mg total) by mouth 2 (two) times daily for 4 days.               Discharge Care Instructions  (From admission, onward)           Start     Ordered   04/15/22 0000  Discharge wound care:       Comments: 03/09/22 Sacrum Mid Unstageable - Full thickness tissue loss in which the base of the injury is covered by slough (yellow, tan, gray, green or brown) and/or eschar (tan, brown or black) in the wound bed. 36 days    Pressure Injury 03/29/22 Heel Right Deep Tissue Pressure Injury - Purple or maroon localized area of discolored intact skin or blood-filled blister due to damage of underlying soft tissue from pressure and/or shear. 16 days. Every shift      Comments: Wound care to sacral Unstageable pressure injury:  Cleanse with NS, pat dry. Apply thin layer of MediHoney to wound, top with dry gauze and secure with silicone foam for sacrum. Change daily. Turn and position off of the supine position.    Pressure Injury 04/09/22 Heel Left Deep Tissue Pressure Injury  - Purple or maroon localized area of discolored intact skin or blood-filled blister due to damage of underlying soft tissue from pressure and/or shear - Daily   04/15/22 1101            Follow-up Information     [facility] Follow up.   Contact information: [address] [place] [id number] [phone]               Allergies  Allergen Reactions   Lactose Intolerance (Gi) Other (See Comments)    Intolerance    Lovastatin Other (See Comments)    Unknown reaction   Mometasone Furo-Formoterol Fum Other (See Comments)    Loss of appetite, laryngitis    Peanut-Containing Drug Products Other (See Comments)   Prozac [Fluoxetine Hcl] Other (See Comments)    Jumpy   Sulfa Antibiotics Other (See Comments)    Unknown reaction     Other Procedures/Studies: DG Chest Port 1 View  Result Date: 04/15/2022 CLINICAL DATA:  Pneumothorax EXAM: PORTABLE CHEST 1 VIEW COMPARISON:  04/14/2022 FINDINGS: Interval removal of left chest tube. No pneumothorax. Stable cardiomediastinal contours. Persistent bilateral airspace opacities, right worse than left, not appreciably changed from prior. Probable small bilateral pleural effusions. IMPRESSION: 1. Interval removal of left chest tube. No pneumothorax. 2. Otherwise, no significant interval change. Electronically Signed   By: Davina Poke D.O.   On: 04/15/2022 09:50   DG CHEST PORT 1 VIEW  Result Date: 04/14/2022 CLINICAL DATA:  Chest tube. EXAM: PORTABLE CHEST 1 VIEW COMPARISON:  04/12/2022 FINDINGS: Left-sided pigtail pleural drainage catheter unchanged. Lungs are otherwise adequately inflated as previously seen small left apical pneumothorax not well visualized on today's exam. Subtle hazy opacification over the left perihilar region and left base likely small left effusion and possible mild vascular congestion. Patchy airspace opacification over the right lung slightly worse and may be due to  multifocal infection. Suggestion of small right pleural effusion. Cardiomediastinal silhouette and remainder of the exam is unchanged. IMPRESSION: 1. Patchy airspace opacification over the right lung slightly worse and may be due to multifocal infection. Suggestion of small right pleural effusion. 2. Stable left-sided pigtail pleural drainage catheter. Previously seen small left apical pneumothorax not visualized on today's exam. 3. Suggestion of mild vascular congestion as well as small left effusion/basilar atelectasis. Electronically Signed   By: Marin Olp M.D.   On: 04/14/2022 09:57   DG CHEST PORT 1 VIEW  Result Date: 04/12/2022 CLINICAL DATA:  Chest tube. EXAM: PORTABLE CHEST 1 VIEW COMPARISON:  Same day. FINDINGS: Stable cardiomediastinal silhouette. Stable position of left-sided chest tube. Stable small left apical  pneumothorax. Stable bilateral lung opacities. IMPRESSION: Stable position of left-sided chest tube. Stable small left apical pneumothorax. Stable bilateral lung opacities. Electronically Signed   By: Marijo Conception M.D.   On: 04/12/2022 15:29   DG Chest Port 1 View  Result Date: 04/12/2022 CLINICAL DATA:  Shortness of breath, pneumothorax. EXAM: PORTABLE CHEST 1 VIEW COMPARISON:  Same day. FINDINGS: Stable cardiomediastinal silhouette. Stable small left apical pneumothorax is noted. Stable bilateral lung opacities are noted with associated pleural effusions. Left-sided chest tube is unchanged. IMPRESSION: Stable position of left-sided chest tube. Stable small left apical pneumothorax. Stable bilateral lung opacities and pleural effusions. Electronically Signed   By: Marijo Conception M.D.   On: 04/12/2022 13:37   DG Chest Port 1 View  Result Date: 04/12/2022 CLINICAL DATA:  Follow-up pleural effusions and pneumothorax. EXAM: PORTABLE CHEST 1 VIEW COMPARISON:  04/11/2022 FINDINGS: A left pleural drain remains in place. A small approximately 5-10% left apical pneumothorax shows no  significant change. Small pleural effusions and bibasilar atelectasis or infiltrates show no significant change. Bilateral upper lobe airspace disease also shows no significant change. Cardiomegaly remains stable. IMPRESSION: No significant change in small approximately 5-10% left apical pneumothorax, small bilateral pleural effusions and bibasilar atelectasis versus infiltrates. Stable bilateral upper lobe airspace disease. Electronically Signed   By: Marlaine Hind M.D.   On: 04/12/2022 08:17   DG Chest Port 1 View  Result Date: 04/11/2022 CLINICAL DATA:  Reason for exam: S/P thoracostomy tube placement EXAM: PORTABLE CHEST - 1 VIEW COMPARISON:  Earlier film of the same day FINDINGS: Pigtail left chest tube has been placed laterally at the left lung base. Significant interval decrease in size of pleural effusion. Trace apical pneumothorax. Coarse suprahilar airspace opacities appear slightly increased. Heart size and mediastinal contours are within normal limits. Aortic Atherosclerosis (ICD10-170.0). Persistent small right pleural effusion with atelectasis/consolidation in the right lung base. Visualized bones unremarkable. IMPRESSION: 1. Left chest tube placement with significant decrease in size of pleural effusion. 2. Trace left apical pneumothorax. Electronically Signed   By: Lucrezia Europe M.D.   On: 04/11/2022 15:02   DG CHEST PORT 1 VIEW  Result Date: 04/11/2022 CLINICAL DATA:  Pleural effusion EXAM: PORTABLE CHEST 1 VIEW COMPARISON:  Previous studies including the examination of 04/09/2022 FINDINGS: Small to moderate bilateral pleural effusions are seen. There is interval increase in amount of right pleural effusion. There is no pneumothorax. There are patchy infiltrates in both upper lung fields, more so on the right side with interval improvement in the left upper lobe infiltrate. Infiltrates are seen in both lower lung fields suggesting atelectasis/pneumonia. There is no pneumothorax. Degenerative  changes are noted in right shoulder. IMPRESSION: Small to moderate bilateral pleural effusions. There is interval increase in amount of right pleural effusion. There are patchy infiltrates in both lungs suggesting possible multifocal pneumonia with slight improvement in the left upper lobe infiltrate. Electronically Signed   By: Elmer Picker M.D.   On: 04/11/2022 13:13   VAS [place] LOWER EXTREMITY VENOUS (DVT)  Result Date: 04/10/2022  Lower Venous DVT Study Patient Name:  KYNLEIGH ARTZ  Date of Exam:   04/10/2022 Medical Rec #: [id number]        Accession #:    [id number] Date of Birth: [birth year]       Patient Gender: F Patient Age:   26 years Exam Location:  [facility] Procedure:      VAS [place] LOWER EXTREMITY VENOUS (DVT) Referring Phys: Rex Kras --------------------------------------------------------------------------------  Indications: Swelling, and Edema.  Performing Technologist: Archie Patten RVS  Examination Guidelines: A complete evaluation includes B-mode imaging, spectral Doppler, color Doppler, and power Doppler as needed of all accessible portions of each vessel. Bilateral testing is considered an integral part of a complete examination. Limited examinations for reoccurring indications may be performed as noted. The reflux portion of the exam is performed with the patient in reverse Trendelenburg.  +---------+---------------+---------+-----------+----------+--------------+ RIGHT    CompressibilityPhasicitySpontaneityPropertiesThrombus Aging +---------+---------------+---------+-----------+----------+--------------+ CFV      Full           Yes      Yes                                 +---------+---------------+---------+-----------+----------+--------------+ SFJ      Full                                                        +---------+---------------+---------+-----------+----------+--------------+ FV Prox  Full                                                         +---------+---------------+---------+-----------+----------+--------------+ FV Mid   Full                                                        +---------+---------------+---------+-----------+----------+--------------+ FV DistalFull                                                        +---------+---------------+---------+-----------+----------+--------------+ PFV      Full                                                        +---------+---------------+---------+-----------+----------+--------------+ POP      Full           Yes      Yes                                 +---------+---------------+---------+-----------+----------+--------------+ PTV      Full                                                        +---------+---------------+---------+-----------+----------+--------------+ PERO     Full                                                        +---------+---------------+---------+-----------+----------+--------------+   +---------+---------------+---------+-----------+----------+--------------+  LEFT     CompressibilityPhasicitySpontaneityPropertiesThrombus Aging +---------+---------------+---------+-----------+----------+--------------+ CFV      Full           Yes      Yes                                 +---------+---------------+---------+-----------+----------+--------------+ SFJ      Full                                                        +---------+---------------+---------+-----------+----------+--------------+ FV Prox  Full                                                        +---------+---------------+---------+-----------+----------+--------------+ FV Mid   Full                                                        +---------+---------------+---------+-----------+----------+--------------+ FV DistalFull                                                         +---------+---------------+---------+-----------+----------+--------------+ PFV      Full                                                        +---------+---------------+---------+-----------+----------+--------------+ POP      Full           Yes      Yes                                 +---------+---------------+---------+-----------+----------+--------------+ PTV      Full                                                        +---------+---------------+---------+-----------+----------+--------------+ PERO     Full                                                        +---------+---------------+---------+-----------+----------+--------------+     Summary: BILATERAL: - No evidence of deep vein thrombosis seen in the lower extremities, bilaterally. -No evidence of popliteal cyst, bilaterally.   *See table(s) above for measurements and observations. Electronically signed by Harold Barban MD on 04/10/2022 at 11:28:37 PM.  Final    [place] EKG SITE RITE  Result Date: 04/09/2022 If Site Rite image not attached, placement could not be confirmed due to current cardiac rhythm.  ECHOCARDIOGRAM COMPLETE  Result Date: 04/09/2022    ECHOCARDIOGRAM REPORT   Patient Name:   GENIYA FULGHAM Date of Exam: 04/09/2022 Medical Rec #:  [id number]       Height:       62.0 in Accession #:    [id number]      Weight:       106.7 lb Date of Birth:  04-09-1936      BSA:          1.464 m Patient Age:    66 years        BP:           105/41 mmHg Patient Gender: F               HR:           60 bpm. Exam Location:  Inpatient Procedure: 2D Echo, Cardiac Doppler, Color Doppler and 3D Echo Indications:    CHF-Acute Diastolic U31.49  History:        Patient has prior history of Echocardiogram examinations, most                 recent 03/29/2022. Stroke; Arrythmias:Atrial Fibrillation.  Sonographer:    Bernadene Person RDCS Referring Phys: [id number] [facility] A THOMAS  Sonographer Comments: Image acquisition  challenging due to patient body habitus. IMPRESSIONS  1. Left ventricular ejection fraction, by estimation, is 60 to 65%. The left ventricle has normal function. The left ventricle has no regional wall motion abnormalities. Left ventricular diastolic parameters are indeterminate.  2. Right ventricular systolic function is normal. The right ventricular size is normal. There is normal pulmonary artery systolic pressure. The estimated right ventricular systolic pressure is 58.8 mmHg.  3. Left atrial size was mild to moderately dilated.  4. Right atrial size was mild to moderately dilated.  5. The mitral valve is normal in structure. Mild mitral valve regurgitation. No evidence of mitral stenosis.  6. The aortic valve is grossly normal. There is mild calcification of the aortic valve. There is mild thickening of the aortic valve. Aortic valve regurgitation is trivial. Aortic valve sclerosis is present, with no evidence of aortic valve stenosis.  7. The inferior vena cava is normal in size with greater than 50% respiratory variability, suggesting right atrial pressure of 3 mmHg. Comparison(s): No significant change from prior study. Conclusion(s)/Recommendation(s): Otherwise normal echocardiogram, with minor abnormalities described in the report. FINDINGS  Left Ventricle: Left ventricular ejection fraction, by estimation, is 60 to 65%. The left ventricle has normal function. The left ventricle has no regional wall motion abnormalities. The left ventricular internal cavity size was normal in size. There is  no left ventricular hypertrophy. Left ventricular diastolic parameters are indeterminate. Right Ventricle: The right ventricular size is normal. No increase in right ventricular wall thickness. Right ventricular systolic function is normal. There is normal pulmonary artery systolic pressure. The tricuspid regurgitant velocity is 2.30 m/s, and  with an assumed right atrial pressure of 3 mmHg, the estimated right  ventricular systolic pressure is 50.2 mmHg. Left Atrium: Left atrial size was mild to moderately dilated. Right Atrium: Right atrial size was mild to moderately dilated. Pericardium: There is no evidence of pericardial effusion. Mitral Valve: The mitral valve is normal in structure. Mild to moderate mitral annular calcification. Mild mitral valve regurgitation. No evidence of mitral  valve stenosis. MV peak gradient, 4.0 mmHg. The mean mitral valve gradient is 2.0 mmHg. Tricuspid Valve: The tricuspid valve is normal in structure. Tricuspid valve regurgitation is mild . No evidence of tricuspid stenosis. Aortic Valve: The aortic valve is grossly normal. There is mild calcification of the aortic valve. There is mild thickening of the aortic valve. Aortic valve regurgitation is trivial. Aortic valve sclerosis is present, with no evidence of aortic valve stenosis. Pulmonic Valve: The pulmonic valve was not well visualized. Pulmonic valve regurgitation is not visualized. No evidence of pulmonic stenosis. Aorta: The aortic root, ascending aorta, aortic arch and descending aorta are all structurally normal, with no evidence of dilitation or obstruction. Venous: The inferior vena cava is normal in size with greater than 50% respiratory variability, suggesting right atrial pressure of 3 mmHg. IAS/Shunts: The atrial septum is grossly normal. Additional Comments: There is pleural effusion in both left and right lateral regions.  LEFT VENTRICLE PLAX 2D LVIDd:         4.80 cm      Diastology LVIDs:         3.00 cm      LV e' medial:    8.49 cm/s LV PW:         0.60 cm      LV E/e' medial:  10.1 LV IVS:        0.60 cm      LV e' lateral:   7.62 cm/s LVOT diam:     1.80 cm      LV E/e' lateral: 11.2 LV SV:         82 LV SV Index:   56 LVOT Area:     2.54 cm                              3D Volume EF: LV Volumes (MOD)            3D EF:        60 % LV vol d, MOD A2C: 114.0 ml LV EDV:       118 ml LV vol d, MOD A4C: 85.2 ml  LV ESV:        47 ml LV vol s, MOD A2C: 42.4 ml  LV SV:        71 ml LV vol s, MOD A4C: 30.2 ml LV SV MOD A2C:     71.6 ml LV SV MOD A4C:     85.2 ml LV SV MOD BP:      64.2 ml RIGHT VENTRICLE RV S prime:     12.20 cm/s TAPSE (M-mode): 2.8 cm LEFT ATRIUM             Index        RIGHT ATRIUM           Index LA diam:        4.00 cm 2.73 cm/m   RA Area:     17.60 cm LA Vol (A2C):   55.2 ml 37.71 ml/m  RA Volume:   49.50 ml  33.82 ml/m LA Vol (A4C):   55.0 ml 37.58 ml/m LA Biplane Vol: 60.5 ml 41.33 ml/m  AORTIC VALVE LVOT Vmax:   126.00 cm/s LVOT Vmean:  84.400 cm/s LVOT VTI:    0.322 m  AORTA Ao Root diam: 2.60 cm Ao Asc diam:  3.10 cm MITRAL VALVE  TRICUSPID VALVE MV Area (PHT): 2.76 cm       TR Peak grad:   21.2 mmHg MV Area VTI:   2.25 cm       TR Vmax:        230.00 cm/s MV Peak grad:  4.0 mmHg MV Mean grad:  2.0 mmHg       SHUNTS MV Vmax:       1.00 m/s       Systemic VTI:  0.32 m MV Vmean:      63.0 cm/s      Systemic Diam: 1.80 cm MV Decel Time: 275 msec MR Peak grad:    76.7 mmHg MR Mean grad:    56.5 mmHg MR Vmax:         438.00 cm/s MR Vmean:        363.0 cm/s MR PISA:         0.57 cm MR PISA Eff ROA: 5 mm MR PISA Radius:  0.30 cm MV E velocity: 85.40 cm/s MV A velocity: 73.90 cm/s MV E/A ratio:  1.16 Buford Dresser MD Electronically signed by Buford Dresser MD Signature Date/Time: 04/09/2022/11:15:01 AM    Final    IR THORACENTESIS ASP PLEURAL SPACE W/IMG GUIDE  Result Date: 04/09/2022 INDICATION: 86 year old female. History of AFib. Presented to the ED with shortness of breath and productive cough. Found to have bilateral pleural effusions. Request is for therapeutic thoracentesis. Right is greater than EXAM: ULTRASOUND GUIDED THERAPEUTIC RIGHT-SIDED  THORACENTESIS MEDICATIONS: Lidocaine 1% 10 mL COMPLICATIONS: None immediate. PROCEDURE: An ultrasound guided thoracentesis was thoroughly discussed with the patient and questions answered. The benefits, risks,  alternatives and complications were also discussed. The patient understands and wishes to proceed with the procedure. Written consent was obtained. Ultrasound was performed to localize and mark an adequate pocket of fluid in the right chest. The area was then prepped and draped in the normal sterile fashion. 1% Lidocaine was used for local anesthesia. Under ultrasound guidance a 6 Fr Safe-T-Centesis catheter was introduced. Thoracentesis was performed. The catheter was removed and a dressing applied. FINDINGS: A total of approximately 600 mL of amber fluid was removed. IMPRESSION: Successful ultrasound guided right-sided therapeutic thoracentesis yielding 600 mL of pleural fluid. Read by: Rushie Nyhan, NP Electronically Signed   By: Ruthann Cancer M.D.   On: 04/09/2022 10:59   DG Chest 1 View  Result Date: 04/09/2022 CLINICAL DATA:  Post thoracentesis EXAM: CHEST  1 VIEW COMPARISON:  04/08/2022 FINDINGS: Upper normal heart size. Atherosclerotic calcification aorta. Bibasilar pleural effusions. Scattered BILATERAL pulmonary infiltrates favoring multifocal pneumonia. No pneumothorax. Bones demineralized with chronic RIGHT rotator cuff tear. IMPRESSION: No pneumothorax following thoracentesis. Patchy BILATERAL pulmonary infiltrates favoring multifocal pneumonia. BILATERAL pleural effusions. Aortic Atherosclerosis (ICD10-I70.0). Electronically Signed   By: Lavonia Dana M.D.   On: 04/09/2022 10:44   CT Angio Chest PE W and/or Wo Contrast  Result Date: 04/08/2022 CLINICAL DATA:  Pulmonary embolism (PE) suspected, high prob Generalized body aches and shortness of breath. Nausea. EXAM: CT ANGIOGRAPHY CHEST WITH CONTRAST TECHNIQUE: Multidetector CT imaging of the chest was performed using the standard protocol during bolus administration of intravenous contrast. Multiplanar CT image reconstructions and MIPs were obtained to evaluate the vascular anatomy. RADIATION DOSE REDUCTION: This exam was performed  according to the departmental dose-optimization program which includes automated exposure control, adjustment of the mA and/or kV according to patient size and/or use of iterative reconstruction technique. CONTRAST:  26m OMNIPAQUE IOHEXOL 350 MG/ML SOLN COMPARISON:  Chest radiograph earlier today. Noted portions from abdominopelvic CT earlier today. Chest CTA 12/24/2021 FINDINGS: Cardiovascular: There are no filling defects within the pulmonary arteries to suggest pulmonary embolus. Calcified aortic atherosclerosis. Stable aortic tortuosity. There is no contrast in the aorta. Cardiomegaly with contrast refluxing into the hepatic veins and IVC. No pericardial effusion. Mediastinum/Nodes: 11 mm right hilar node. There are shotty mediastinal lymph nodes. Patulous esophagus. Lungs/Pleura: Moderate bilateral pleural effusions. There is associated compressive atelectasis. Additionally non dependent consolidation in the right lower lobe is atypical for passive atelectasis. Nodular and geographic ground-glass airspace disease in the right upper and middle and left upper lobes. No endobronchial lesions or debris. Calcified granuloma in the lung bases. Upper Abdomen: Assessed on abdominal CT earlier today. No new findings. Musculoskeletal: Generalized body wall edema. There are no acute or suspicious osseous abnormalities. Review of the MIP images confirms the above findings. IMPRESSION: 1. No pulmonary embolus. 2. Moderate bilateral pleural effusions with associated compressive atelectasis. Additionally non dependent consolidation in the right lower lobe is atypical for passive atelectasis, and suspicious for superimposed pneumonia. 3. Nodular and geographic ground-glass airspace disease in the right upper and middle and left upper lobes, likely infectious or inflammatory in etiology. Pulmonary edema is considered but felt much less likely. 4. Cardiomegaly with contrast refluxing into the hepatic veins and IVC consistent  with elevated right heart pressures. Generalized body wall edema/anasarca. Aortic Atherosclerosis (ICD10-I70.0). Electronically Signed   By: Keith Rake M.D.   On: 04/08/2022 21:53   CT ABDOMEN PELVIS W CONTRAST  Result Date: 04/08/2022 CLINICAL DATA:  Acute abdominal pain EXAM: CT ABDOMEN AND PELVIS WITH CONTRAST TECHNIQUE: Multidetector CT imaging of the abdomen and pelvis was performed using the standard protocol following bolus administration of intravenous contrast. RADIATION DOSE REDUCTION: This exam was performed according to the departmental dose-optimization program which includes automated exposure control, adjustment of the mA and/or kV according to patient size and/or use of iterative reconstruction technique. CONTRAST:  13m OMNIPAQUE IOHEXOL 350 MG/ML SOLN COMPARISON:  03/05/2022 FINDINGS: Lower chest: Lung bases demonstrate bilateral large pleural effusions with associated lower lobe consolidation worse on the right than the left. These changes have increased in the interval from the prior exam. Calcified granuloma is again seen in the right middle lobe. Hepatobiliary: Gallbladder has been surgically removed. Liver shows no focal mass lesion. Pancreas: Unremarkable. No pancreatic ductal dilatation or surrounding inflammatory changes. Spleen: Normal in size without focal abnormality. Adrenals/Urinary Tract: Adrenal glands are within normal limits bilaterally. Kidneys demonstrate a normal enhancement pattern. Stable left upper pole simple cyst is noted. No follow-up is recommended. No renal calculi or obstructive changes are seen. The bladder is well distended. Stomach/Bowel: Changes consistent with a decompressed Hartmann pouch are now seen. Right-sided end ileostomy is noted. Changes consistent with the subtotal colectomy are noted. Stomach is decompressed. The visualized small bowel shows no obstructive changes. Small bowel fluid is noted throughout. Vascular/Lymphatic: Aortic  atherosclerosis. No enlarged abdominal or pelvic lymph nodes. 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 No findings to suggest mesenteric ischemia are noted. Reproductive: Status post hysterectomy. No adnexal masses. Other: No significant free pelvic fluid is noted. Mild changes of anasarca are seen in the lateral abdominal wall. Musculoskeletal: Degenerative changes of lumbar spine are noted. IMPRESSION: Increase in large bilateral pleural effusions with lower lobe consolidation right considerably greater than left. Interval changes consistent with subtotal colectomy and right-sided end ileostomy. No obstructive changes are seen. Electronically Signed   By:  Inez Catalina M.D.   On: 04/08/2022 20:03   DG Chest Port 1 View  Result Date: 04/08/2022 CLINICAL DATA:  Shortness of breath EXAM: PORTABLE CHEST 1 VIEW COMPARISON:  03/23/2022 FINDINGS: Cardiac shadow is stable. Aortic calcifications are again noted. Chronic pleural effusions are noted stable from the prior study. Skin folds are noted over the left chest. Some patchy airspace opacity is noted in the right lung base adjacent to the effusion. No acute bony abnormality is seen. IMPRESSION: Chronic bilateral effusions. Patchy right basilar airspace opacity. Electronically Signed   By: Inez Catalina M.D.   On: 04/08/2022 19:00   ECHOCARDIOGRAM COMPLETE  Result Date: 03/29/2022    ECHOCARDIOGRAM REPORT   Patient Name:   ADLAI NIEBLAS Date of Exam: 03/22/2022 Medical Rec #:  [id number]       Height:       62.0 in Accession #:    [id number]      Weight:       95.0 lb Date of Birth:  1936/01/31      BSA:          1.393 m Patient Age:    63 years        BP:           135/55 mmHg Patient Gender: F               HR:           90 bpm. Exam Location:  Inpatient Procedure: 2D Echo, Cardiac Doppler and Color Doppler Indications:     Atrial fibrillation  History:         Patient has prior history of Echocardiogram  examinations, most                  recent 12/14/2021. Arrythmias:Atrial Fibrillation; Risk                  Factors:Hypertension.  Sonographer:     Clayton Lefort RDCS (AE) Referring Phys:  Brand Males Diagnosing Phys: Collene Mares Custovic IMPRESSIONS  1. Left ventricular ejection fraction, by estimation, is 60 to 65%. Left ventricular ejection fraction by PLAX is 64 %. The left ventricle has normal function. The left ventricle has no regional wall motion abnormalities. Left ventricular diastolic parameters are consistent with Grade I diastolic dysfunction (impaired relaxation).  2. Right ventricular systolic function is normal. The right ventricular size is normal.  3. The mitral valve is grossly normal. Trivial mitral valve regurgitation. No evidence of mitral stenosis.  4. The aortic valve is abnormal. Aortic valve regurgitation is not visualized. Aortic valve sclerosis is present, with no evidence of aortic valve stenosis. FINDINGS  Left Ventricle: Left ventricular ejection fraction, by estimation, is 60 to 65%. Left ventricular ejection fraction by PLAX is 64 %. The left ventricle has normal function. The left ventricle has no regional wall motion abnormalities. The left ventricular internal cavity size was normal in size. There is no left ventricular hypertrophy. Left ventricular diastolic parameters are consistent with Grade I diastolic dysfunction (impaired relaxation). Right Ventricle: The right ventricular size is normal. No increase in right ventricular wall thickness. Right ventricular systolic function is normal. Left Atrium: Left atrial size was normal in size. Right Atrium: Right atrial size was normal in size. Pericardium: There is no evidence of pericardial effusion. Mitral Valve: The mitral valve is grossly normal. There is mild thickening of the mitral valve leaflet(s). There is mild calcification of the mitral valve leaflet(s). Trivial mitral valve regurgitation. No evidence  of mitral valve stenosis.  Tricuspid Valve: The tricuspid valve is grossly normal. Tricuspid valve regurgitation is trivial. Aortic Valve: The aortic valve is abnormal. Aortic valve regurgitation is not visualized. Aortic valve sclerosis is present, with no evidence of aortic valve stenosis. Aortic valve mean gradient measures 10.0 mmHg. Aortic valve peak gradient measures 19.9 mmHg. Aortic valve area, by VTI measures 1.66 cm. Pulmonic Valve: The pulmonic valve was grossly normal. Pulmonic valve regurgitation is not visualized. Aorta: The aortic root is normal in size and structure. IAS/Shunts: No atrial level shunt detected by color flow Doppler.  LEFT VENTRICLE PLAX 2D LV EF:         Left            Diastology                ventricular     LV e' medial:    8.05 cm/s                ejection        LV E/e' medial:  12.5                fraction by     LV e' lateral:   11.30 cm/s                PLAX is 64      LV E/e' lateral: 8.9                %. LVIDd:         4.30 cm LVIDs:         2.80 cm LV PW:         0.80 cm LV IVS:        0.80 cm LVOT diam:     1.90 cm LV SV:         61 LV SV Index:   44 LVOT Area:     2.84 cm  RIGHT VENTRICLE RV Basal diam:  2.70 cm RV S prime:     12.60 cm/s TAPSE (M-mode): 2.6 cm LEFT ATRIUM           Index        RIGHT ATRIUM           Index LA diam:      2.50 cm 1.79 cm/m   RA Area:     12.60 cm LA Vol (A2C): 21.0 ml 15.07 ml/m  RA Volume:   29.10 ml  20.89 ml/m LA Vol (A4C): 21.0 ml 15.07 ml/m  AORTIC VALVE AV Area (Vmax):    1.54 cm AV Area (Vmean):   1.55 cm AV Area (VTI):     1.66 cm AV Vmax:           223.00 cm/s AV Vmean:          143.000 cm/s AV VTI:            0.365 m AV Peak Grad:      19.9 mmHg AV Mean Grad:      10.0 mmHg LVOT Vmax:         121.00 cm/s LVOT Vmean:        78.000 cm/s LVOT VTI:          0.214 m LVOT/AV VTI ratio: 0.59  AORTA Ao Root diam: 2.30 cm MITRAL VALVE MV Area (PHT): 4.31 cm     SHUNTS MV Decel Time: 176 msec     Systemic VTI:  0.21 m MV E velocity: 101.00 cm/s  Systemic Diam: 1.90 cm MV A velocity: 108.00 cm/s MV E/A ratio:  0.94 Sabina Custovic Electronically signed by Floydene Flock Signature Date/Time: 03/29/2022/7:40:35 PM    Final    DG CHEST PORT 1 VIEW  Result Date: 03/23/2022 CLINICAL DATA:  Respiratory distress. EXAM: PORTABLE CHEST 1 VIEW COMPARISON:  03/20/2022 and prior studies FINDINGS: Cardiomediastinal silhouette is unchanged. A RIGHT PICC line is identified with tip overlying the LOWER SVC. Increasing pulmonary vascular congestion and mild bilateral interstitial opacities are present. New moderate to large sized RIGHT LOWER lung opacity noted. Small bilateral pleural effusions have slightly increased. There is no evidence of pneumothorax. IMPRESSION: 1. Increasing interstitial pulmonary edema and slightly increased small bilateral pleural effusions. 2. New moderate to large sized RIGHT LOWER lung opacity which may represent airspace disease/pneumonia, atelectasis and/or effusion. Electronically Signed   By: Margarette Canada M.D.   On: 03/23/2022 08:24   DG Abd Portable 1V  Result Date: 03/22/2022 CLINICAL DATA:  Encounter for nasogastric tube placement. EXAM: PORTABLE ABDOMEN - 1 VIEW COMPARISON:  03/20/2022 FINDINGS: Nasogastric tube tip is in the left abdomen and likely within the stomach. Evidence for a PICC line tip in the SVC region but only partially imaged. Limited evaluation of the lung bases. Skin staples in the abdomen. Small amount of gas within the stomach. IMPRESSION: Nasogastric tube is in the left abdomen and expected region of the stomach. Electronically Signed   By: Markus Daft M.D.   On: 03/22/2022 07:56   DG Abd Portable 1V  Addendum Date: 03/21/2022   ADDENDUM REPORT: 03/21/2022 00:31 ADDENDUM: These results were called by telephone at the time of interpretation on 03/20/2022 at 11:35 pm to provider TIMOTHY OPYD , who verbally acknowledged these results. Electronically Signed   By: Ronney Asters M.D.   On: 03/21/2022 00:31    Result Date: 03/21/2022 CLINICAL DATA:  Abdominal pain EXAM: PORTABLE ABDOMEN - 1 VIEW COMPARISON:  Abdominal x-ray 03/16/2022. CT abdomen and pelvis 03/05/2022 FINDINGS: There is a new large amount of free intraperitoneal air. There are dilated loops of bowel in the central abdomen measuring up to 10 cm. Minimal colonic gas and rectal gas present. No suspicious calcifications. Lung bases are clear. No acute fractures are identified. IMPRESSION: 1. New large amount of free intraperitoneal air. 2. Dilated loops of bowel in the central abdomen measuring up to 10 cm, likely colon. Electronically Signed: By: Ronney Asters M.D. On: 03/20/2022 23:15   DG CHEST PORT 1 VIEW  Result Date: 03/20/2022 CLINICAL DATA:  Shortness of breath EXAM: PORTABLE CHEST 1 VIEW COMPARISON:  Previous studies including the examination of 03/19/2022 FINDINGS: Cardiac size is within normal limits. Tip of right PICC line is seen in superior vena cava. There is interval decrease in interstitial markings in parahilar regions and lower lung fields suggesting further clearing of pulmonary edema. No new focal infiltrates are seen. Small bilateral pleural effusions are seen. There is no pneumothorax. Degenerative changes are noted in right shoulder. IMPRESSION: There is decrease in interstitial markings in both lungs suggesting clearing of pulmonary edema. No new focal infiltrates are seen. Small bilateral pleural effusions are seen. Electronically Signed   By: Elmer Picker M.D.   On: 03/20/2022 12:37   DG CHEST PORT 1 VIEW  Result Date: 03/19/2022 CLINICAL DATA:  Shortness of breath EXAM: PORTABLE CHEST 1 VIEW COMPARISON:  Radiograph 03/16/2022 FINDINGS: Right upper extremity PICC tip overlies the distal SVC. Unchanged size of the cardiomediastinal silhouette. There are mild interstitial opacities and central  pulmonary vascular prominence, decreased from prior exam. Persistent small pleural effusions. No pneumothorax. No new  airspace disease. Thoracic spondylosis. Bilateral shoulder degenerative changes. IMPRESSION: Improving interstitial pulmonary edema. Persistent small pleural effusions. Electronically Signed   By: Maurine Simmering M.D.   On: 03/19/2022 11:52   DG CHEST PORT 1 VIEW  Result Date: 03/16/2022 CLINICAL DATA:  Shortness of breath EXAM: PORTABLE CHEST 1 VIEW COMPARISON:  03/05/2022 FINDINGS: Transverse diameter of heart is within normal limits. Central pulmonary vessels are more prominent. There is subtle increase in interstitial markings in parahilar regions and right lower lung field. Small bilateral pleural effusions are seen. There is no focal pulmonary consolidation. Tip of right upper extremity PICC line is seen in superior vena cava. IMPRESSION: Central pulmonary vessels are more prominent suggesting CHF. There is subtle increase in interstitial markings in both lungs suggesting interstitial pulmonary edema. Small bilateral pleural effusions. Electronically Signed   By: Elmer Picker M.D.   On: 03/16/2022 18:38   DG Abd 1 View  Result Date: 03/16/2022 CLINICAL DATA:  Abdominal distension EXAM: ABDOMEN - 1 VIEW COMPARISON:  None Available. FINDINGS: No dilated large or small bowel. Gas in the rectum. No organomegaly. No pathologic calcifications. IMPRESSION: No evidence of bowel obstruction. Electronically Signed   By: Suzy Bouchard M.D.   On: 03/16/2022 18:37     TODAY-DAY OF DISCHARGE:  Subjective:   Julie Montgomery today has no headache,no chest abdominal pain,no new weakness tingling or numbness, feels much better wants to go home today.   Objective:   Blood pressure (!) 110/49, pulse 78, temperature 98 F (36.7 C), temperature source Oral, resp. rate 19, weight 46.8 kg, SpO2 99 %.  Intake/Output Summary (Last 24 hours) at 04/15/2022 1125 Last data filed at 04/15/2022 0600 Gross per 24 hour  Intake --  Output 1160 ml  Net -1160 ml   Filed Weights   04/09/22 1813  Weight: 46.8 kg     Exam: Awake Alert, Oriented *3, No new F.N deficits, Normal affect [place].AT,PERRAL Supple Neck,No JVD, No cervical lymphadenopathy appriciated.  Symmetrical Chest wall movement, Good air movement bilaterally, CTAB RRR,No Gallops,Rubs or new Murmurs, No Parasternal Heave +ve B.Sounds, Abd Soft, Non tender, No organomegaly appriciated, No rebound -guarding or rigidity. No Cyanosis, Clubbing or edema, No new Rash or bruise   PERTINENT RADIOLOGIC STUDIES: DG Chest Port 1 View  Result Date: 04/15/2022 CLINICAL DATA:  Pneumothorax EXAM: PORTABLE CHEST 1 VIEW COMPARISON:  04/14/2022 FINDINGS: Interval removal of left chest tube. No pneumothorax. Stable cardiomediastinal contours. Persistent bilateral airspace opacities, right worse than left, not appreciably changed from prior. Probable small bilateral pleural effusions. IMPRESSION: 1. Interval removal of left chest tube. No pneumothorax. 2. Otherwise, no significant interval change. Electronically Signed   By: Davina Poke D.O.   On: 04/15/2022 09:50   DG CHEST PORT 1 VIEW  Result Date: 04/14/2022 CLINICAL DATA:  Chest tube. EXAM: PORTABLE CHEST 1 VIEW COMPARISON:  04/12/2022 FINDINGS: Left-sided pigtail pleural drainage catheter unchanged. Lungs are otherwise adequately inflated as previously seen small left apical pneumothorax not well visualized on today's exam. Subtle hazy opacification over the left perihilar region and left base likely small left effusion and possible mild vascular congestion. Patchy airspace opacification over the right lung slightly worse and may be due to multifocal infection. Suggestion of small right pleural effusion. Cardiomediastinal silhouette and remainder of the exam is unchanged. IMPRESSION: 1. Patchy airspace opacification over the right lung slightly worse and may be due to multifocal infection.  Suggestion of small right pleural effusion. 2. Stable left-sided pigtail pleural drainage catheter. Previously seen  small left apical pneumothorax not visualized on today's exam. 3. Suggestion of mild vascular congestion as well as small left effusion/basilar atelectasis. Electronically Signed   By: Marin Olp M.D.   On: 04/14/2022 09:57     PERTINENT LAB RESULTS: CBC: Recent Labs    04/14/22 0258 04/15/22 0119  WBC 14.3* 12.2*  HGB 8.7* 8.4*  HCT 26.0* 24.8*  PLT 285 263   CMET CMP     Component Value Date/Time   NA 131 (L) 04/13/2022 0310   K 4.1 04/13/2022 0310   CL 93 (L) 04/13/2022 0310   CO2 30 04/13/2022 0310   GLUCOSE 112 (H) 04/13/2022 0310   GLUCOSE 99 05/14/2006 0941   BUN 14 04/13/2022 0310   CREATININE 0.48 04/13/2022 0310   CALCIUM 8.7 (L) 04/13/2022 0310   PROT 7.0 04/08/2022 1825   ALBUMIN 3.8 04/08/2022 1825   AST 43 (H) 04/08/2022 1825   ALT 50 (H) 04/08/2022 1825   ALKPHOS 176 (H) 04/08/2022 1825   BILITOT 0.3 04/08/2022 1825   GFRNONAA >60 04/13/2022 0310   GFRAA >60 07/02/2019 1151    GFR Estimated Creatinine Clearance: 38.7 mL/min (by C-G formula based on SCr of 0.48 mg/dL). No results for input(s): "LIPASE", "AMYLASE" in the last 72 hours. No results for input(s): "CKTOTAL", "CKMB", "CKMBINDEX", "TROPONINI" in the last 72 hours. Invalid input(s): "POCBNP" No results for input(s): "DDIMER" in the last 72 hours. No results for input(s): "HGBA1C" in the last 72 hours. No results for input(s): "CHOL", "HDL", "LDLCALC", "TRIG", "CHOLHDL", "LDLDIRECT" in the last 72 hours. No results for input(s): "TSH", "T4TOTAL", "T3FREE", "THYROIDAB" in the last 72 hours.  Invalid input(s): "FREET3" No results for input(s): "VITAMINB12", "FOLATE", "FERRITIN", "TIBC", "IRON", "RETICCTPCT" in the last 72 hours. Coags: No results for input(s): "INR" in the last 72 hours.  Invalid input(s): "PT" Microbiology: Recent Results (from the past 240 hour(s))  Resp panel by RT-PCR (RSV, Flu A&B, Covid) Anterior Nasal Swab     Status: None   Collection Time: 04/08/22  6:05 PM    Specimen: Anterior Nasal Swab  Result Value Ref Range Status   SARS Coronavirus 2 by RT PCR NEGATIVE NEGATIVE Final    Comment: (NOTE) SARS-CoV-2 target nucleic acids are NOT DETECTED.  The SARS-CoV-2 RNA is generally detectable in upper respiratory specimens during the acute phase of infection. The lowest concentration of SARS-CoV-2 viral copies this assay can detect is 138 copies/mL. A negative result does not preclude SARS-Cov-2 infection and should not be used as the sole basis for treatment or other patient management decisions. A negative result may occur with  improper specimen collection/handling, submission of specimen other than nasopharyngeal swab, presence of viral mutation(s) within the areas targeted by this assay, and inadequate number of viral copies(<138 copies/mL). A negative result must be combined with clinical observations, patient history, and epidemiological information. The expected result is Negative.  Fact Sheet for Patients:  EntrepreneurPulse.com.au  Fact Sheet for Healthcare Providers:  IncredibleEmployment.be  This test is no t yet approved or cleared by the [place] FDA and  has been authorized for detection and/or diagnosis of SARS-CoV-2 by FDA under an Emergency Use Authorization (EUA). This EUA will remain  in effect (meaning this test can be used) for the duration of the COVID-19 declaration under Section 564(b)(1) of the Act, 21 U.S.C.section 360bbb-3(b)(1), unless the authorization is terminated  or revoked sooner.  Influenza A by PCR NEGATIVE NEGATIVE Final   Influenza B by PCR NEGATIVE NEGATIVE Final    Comment: (NOTE) The Xpert Xpress SARS-CoV-2/FLU/RSV plus assay is intended as an aid in the diagnosis of influenza from Nasopharyngeal swab specimens and should not be used as a sole basis for treatment. Nasal washings and aspirates are unacceptable for Xpert Xpress  SARS-CoV-2/FLU/RSV testing.  Fact Sheet for Patients: EntrepreneurPulse.com.au  Fact Sheet for Healthcare Providers: IncredibleEmployment.be  This test is not yet approved or cleared by the [place] FDA and has been authorized for detection and/or diagnosis of SARS-CoV-2 by FDA under an Emergency Use Authorization (EUA). This EUA will remain in effect (meaning this test can be used) for the duration of the COVID-19 declaration under Section 564(b)(1) of the Act, 21 U.S.C. section 360bbb-3(b)(1), unless the authorization is terminated or revoked.     Resp Syncytial Virus by PCR NEGATIVE NEGATIVE Final    Comment: (NOTE) Fact Sheet for Patients: EntrepreneurPulse.com.au  Fact Sheet for Healthcare Providers: IncredibleEmployment.be  This test is not yet approved or cleared by the [place] FDA and has been authorized for detection and/or diagnosis of SARS-CoV-2 by FDA under an Emergency Use Authorization (EUA). This EUA will remain in effect (meaning this test can be used) for the duration of the COVID-19 declaration under Section 564(b)(1) of the Act, 21 U.S.C. section 360bbb-3(b)(1), unless the authorization is terminated or revoked.  Performed at [facility], [address], [place], [place] [address]   Blood Culture (routine x 2)     Status: None   Collection Time: 04/08/22  6:25 PM   Specimen: BLOOD  Result Value Ref Range Status   Specimen Description   Final    BLOOD RIGHT ANTECUBITAL Performed at [facility], [address] [place], [place] [address]    Special Requests   Final    BOTTLES DRAWN AEROBIC AND ANAEROBIC Blood Culture adequate volume Performed at [facility], [address] [place], [place] [address]    Culture   Final    NO GROWTH 5 DAYS Performed at [facility], [place] [address] [place], [place]  [address]    Report Status 04/14/2022 FINAL  Final  Blood Culture (routine x 2)     Status: None   Collection Time: 04/08/22  6:25 PM   Specimen: BLOOD  Result Value Ref Range Status   Specimen Description   Final    BLOOD LEFT ANTECUBITAL Performed at [facility], [address], [place], [place] [address]    Special Requests   Final    BOTTLES DRAWN AEROBIC AND ANAEROBIC Blood Culture adequate volume Performed at [facility], [address], [place], [place] [address]    Culture   Final    NO GROWTH 5 DAYS Performed at [facility], [place] [address] [place], [place] [address]    Report Status 04/14/2022 FINAL  Final  Urine Culture     Status: Abnormal   Collection Time: 04/08/22 10:24 PM   Specimen: In/Out Cath Urine  Result Value Ref Range Status   Specimen Description   Final    IN/OUT CATH URINE Performed at [facility], [address], [place], [place] [address]    Special Requests   Final    NONE Performed at [facility], [address] [place], [place] [address]    Culture 80,000 COLONIES/mL ENTEROCOCCUS FAECALIS (A)  Final   Report Status 04/11/2022 FINAL  Final  Organism ID, Bacteria ENTEROCOCCUS FAECALIS (A)  Final      Susceptibility   Enterococcus faecalis - MIC*    AMPICILLIN <=2 SENSITIVE Sensitive     NITROFURANTOIN <=16 SENSITIVE Sensitive     VANCOMYCIN 1 SENSITIVE Sensitive     * 80,000 COLONIES/mL ENTEROCOCCUS FAECALIS  Respiratory (~20 pathogens) panel by PCR     Status: None   Collection Time: 04/09/22  4:30 AM   Specimen: Nasopharyngeal Swab; Respiratory  Result Value Ref Range Status   Adenovirus NOT DETECTED NOT DETECTED Final   Coronavirus 229E NOT DETECTED NOT DETECTED Final    Comment: (NOTE) The Coronavirus on the Respiratory Panel, DOES NOT test for the novel  Coronavirus (2019 nCoV)    Coronavirus HKU1 NOT DETECTED NOT DETECTED Final   Coronavirus  NL63 NOT DETECTED NOT DETECTED Final   Coronavirus OC43 NOT DETECTED NOT DETECTED Final   Metapneumovirus NOT DETECTED NOT DETECTED Final   Rhinovirus / Enterovirus NOT DETECTED NOT DETECTED Final   Influenza A NOT DETECTED NOT DETECTED Final   Influenza B NOT DETECTED NOT DETECTED Final   Parainfluenza Virus 1 NOT DETECTED NOT DETECTED Final   Parainfluenza Virus 2 NOT DETECTED NOT DETECTED Final   Parainfluenza Virus 3 NOT DETECTED NOT DETECTED Final   Parainfluenza Virus 4 NOT DETECTED NOT DETECTED Final   Respiratory Syncytial Virus NOT DETECTED NOT DETECTED Final   Bordetella pertussis NOT DETECTED NOT DETECTED Final   Bordetella Parapertussis NOT DETECTED NOT DETECTED Final   Chlamydophila pneumoniae NOT DETECTED NOT DETECTED Final   Mycoplasma pneumoniae NOT DETECTED NOT DETECTED Final    Comment: Performed at [facility], [place]. [address] [place], [place] [address]  Culture, blood (Routine X 2) w Reflex to ID Panel     Status: None   Collection Time: 04/09/22  4:30 AM   Specimen: BLOOD RIGHT FOREARM  Result Value Ref Range Status   Specimen Description BLOOD RIGHT FOREARM  Final   Special Requests   Final    BOTTLES DRAWN AEROBIC AND ANAEROBIC Blood Culture results may not be optimal due to an inadequate volume of blood received in culture bottles   Culture   Final    NO GROWTH 5 DAYS Performed at [facility], [place] [address] [place], [place] [address]    Report Status 04/14/2022 FINAL  Final  Culture, blood (Routine X 2) w Reflex to ID Panel     Status: None   Collection Time: 04/09/22  4:35 AM   Specimen: BLOOD  Result Value Ref Range Status   Specimen Description BLOOD LEFT ANTECUBITAL  Final   Special Requests   Final    BOTTLES DRAWN AEROBIC AND ANAEROBIC Blood Culture adequate volume   Culture   Final    NO GROWTH 5 DAYS Performed at [facility], [place] [address], [place], [place] [address]    Report Status 04/14/2022 FINAL  Final  C Difficile  Quick Screen w PCR reflex     Status: None   Collection Time: 04/09/22  6:14 PM  Result Value Ref Range Status   C Diff antigen NEGATIVE NEGATIVE Final   C Diff toxin NEGATIVE NEGATIVE Final   C Diff interpretation No C. difficile detected.  Final    Comment: Performed at [facility], [place] [address] [place],  [address]  MRSA Next Gen by PCR, Nasal     Status: None   Collection Time: 04/09/22  6:16 PM   Specimen:  Nasal Mucosa; Nasal Swab  Result Value Ref Range Status   MRSA by PCR Next Gen NOT DETECTED NOT DETECTED Final    Comment: (NOTE) The GeneXpert MRSA Assay (FDA approved for NASAL specimens only), is one component of a comprehensive MRSA colonization surveillance program. It is not intended to diagnose MRSA infection nor to guide or monitor treatment for MRSA infections. Test performance is not FDA approved in patients less than 66 years old. Performed at [facility], [place] [address], [place], [place] [address]   Body fluid culture w Gram Stain     Status: None   Collection Time: 04/11/22  2:08 PM   Specimen: Thoracentesis; Body Fluid  Result Value Ref Range Status   Specimen Description THORACENTESIS  Final   Special Requests NONE  Final   Gram Stain NO WBC SEEN NO ORGANISMS SEEN   Final   Culture   Final    NO GROWTH 3 DAYS Performed at [facility], [address], [place], [place] [address]    Report Status 04/14/2022 FINAL  Final    FURTHER DISCHARGE INSTRUCTIONS:  Get Medicines reviewed and adjusted: Please take all your medications with you for your next visit with your Primary MD  Laboratory/radiological data: Please request your Primary MD to go over all hospital tests and procedure/radiological results at the follow up, please ask your Primary MD to get all Hospital records sent to his/her office.  In some cases, they will be blood work, cultures and biopsy results pending at the time of your discharge. Please request that your  primary care M.D. goes through all the records of your hospital data and follows up on these results.  Also Note the following: If you experience worsening of your admission symptoms, develop shortness of breath, life threatening emergency, suicidal or homicidal thoughts you must seek medical attention immediately by calling 911 or calling your MD immediately  if symptoms less severe.  You must read complete instructions/literature along with all the possible adverse reactions/side effects for all the Medicines you take and that have been prescribed to you. Take any new Medicines after you have completely understood and accpet all the possible adverse reactions/side effects.   Do not drive when taking Pain medications or sleeping medications (Benzodaizepines)  Do not take more than prescribed Pain, Sleep and Anxiety Medications. It is not advisable to combine anxiety,sleep and pain medications without talking with your primary care practitioner  Special Instructions: If you have smoked or chewed Tobacco  in the last 2 yrs please stop smoking, stop any regular Alcohol  and or any Recreational drug use.  Wear Seat belts while driving.  Please note: You were cared for by a hospitalist during your hospital stay. Once you are discharged, your primary care physician will handle any further medical issues. Please note that NO REFILLS for any discharge medications will be authorized once you are discharged, as it is imperative that you return to your primary care physician (or establish a relationship with a primary care physician if you do not have one) for your post hospital discharge needs so that they can reassess your need for medications and monitor your lab values.  Total Time spent coordinating discharge including counseling, education and face to face time equals greater than 30 minutes.  Signed: Lala Lund 04/15/2022 11:25 AM

## 2022-04-14 NOTE — Progress Notes (Signed)
Daily Progress Note   Patient Name: Julie Montgomery       Date: 04/14/2022 DOB: [birth year]  Age: 86 y.o. MRN#: [id number] Attending Physician: Jonetta Osgood, MD Primary Care Physician: Cassandria Anger, MD Admit Date: 04/08/2022  Reason for Consultation/Follow-up: Establishing goals of care  Subjective: Medical records reviewed including progress notes, labs. Received call from her son [name], he tells me DME has not yet been delivered although they were told it would be this morning.  His concerns with nursing care have been adequately addressed after his discussion with charge nurse yesterday.  Patient assessed at the bedside.  She denies any complaints, in good spirits and cannot think of anything else we can do to help her feel better.  Her husband is present visiting.  He confirms that they have not received any DME and he is not sure if this will happen today, as he is out of the home to be with [name] and unable to accept any delivery.  Emotional support and therapeutic listening was provided as he shared the process of preparing the home.  He feels that they will be successful at home with a current caregiver they have and plan to hire her for 40 hours a week.  We also reviewed his concerns with yesterday's nursing care.    Reviewed the option of discontinuing Foley catheter given that hospice has the PureWick available.  Explained differences.  Family is agreeable to trying pure wick while in the hospital.  Discussed with the care team via secure chat and then provided family with update that due to [facility] scheduling it likely would be best to discharge tomorrow morning as long as DME is delivered.  Family is agreeable.  Questions and concerns addressed.  PMT will continue to follow and  support holistically.  Length of Stay: 5  Physical Exam Vitals and nursing note reviewed.  Constitutional:      General: She is not in acute distress.    Appearance: She is ill-appearing.  Cardiovascular:     Rate and Rhythm: Normal rate.  Pulmonary:     Effort: Pulmonary effort is normal. No respiratory distress.  Skin:    General: Skin is warm and dry.  Neurological:     Mental Status: She is alert. She is confused.     Motor: Weakness  present.  Psychiatric:        Mood and Affect: Mood normal.        Behavior: Behavior normal.        Cognition and Memory: Cognition is impaired. Memory is impaired.            Vital Signs: BP (!) 124/54 (BP Location: Left Arm)   Pulse 62   Temp 97.8 F (36.6 C) (Oral)   Resp 15   Wt 46.8 kg   SpO2 100%   BMI 18.87 kg/m  SpO2: SpO2: 100 % O2 Device: O2 Device: Nasal Cannula O2 Flow Rate: O2 Flow Rate (L/min): 3 L/min      Palliative Assessment/Data: PPS 30-40%   Palliative Care Assessment & Plan   Patient Profile: 86 y.o. female  with past medical history of dementia, recurrent C. difficile infection, permanent A-fib on Eliquis, chronic hyponatremia,  failure to thrive, stroke, H. pylori, abdominal perforation with subtotal colectomy and end ileostomy (03/21/2022) during recent prolonged hospitalization 03/05/22-03/30/22  admitted on 04/08/2022 with shortness of breath and bodyaches.    Patient was initially seen at [facility] ED and transferred to [facility] upon recommendation of critical care.  She is now admitted with healthcare acquired pneumonia, sepsis, acute hypoxic respiratory failure, acute diastolic heart failure exacerbation. PMT has been consulted to assist with goals of care conversation.  Patient is familiar to our service from her last hospitalization.  Assessment: Principal Problem:   HCAP (healthcare-associated pneumonia) Active Problems:   Coronary atherosclerosis due to calcified coronary lesion   Long term current use  of antiarrhythmic drug   Atherosclerosis of aorta (HCC)   Severe protein-energy malnutrition (HCC)   Septic shock (HCC)   Elevated brain natriuretic peptide (BNP) level   Loculated pleural effusion   Status post thoracentesis   Encounter for long-term (current) use of antibiotics   Acute respiratory failure with hypoxia (HCC)   Hypotension   Physical deconditioning   Concern about end of life  Recommendations/Plan: Continue DNR Continue medical optimization prior to discharge home with hospice [name] family still awaiting DME delivery, likely unable to discharge until tomorrow Psychosocial and emotional support provided PMT will continue to follow and support holistically   Prognosis:  Poor long-term prognosis given recurrent acute illnesses/hospitalizations, chronic debility, and failure to thrive   Discharge Planning: Home with Hospice  Care plan was discussed with patient, patient's son, patient's husband, Dr. Sloan Leiter, RN, Dr. Tamala Julian   MDM: High   Ervin Rothbauer Burt Knack, [facility] Palliative Medicine Team Team phone # [phone]  Thank you for allowing the Palliative Medicine Team to assist in the care of this patient. Please utilize secure chat with additional questions, if there is no response within 30 minutes please call the above phone number.  Palliative Medicine Team providers are available by phone from 7am to 7pm daily and can be reached through the team cell phone.  Should this patient require assistance outside of these hours, please call the patient's attending physician.  Portions of this note are a verbal dictation therefore any spelling and/or grammatical errors are due to the "Osgood One" system interpretation.

## 2022-04-14 NOTE — Progress Notes (Signed)
Rapid Response Note  Left pleural chest tube removed per order. Occlusive dressing placed, no drainage. CXR placed in AM per CCM.

## 2022-04-14 NOTE — Progress Notes (Signed)
   NAME:  Julie Montgomery, MRN:  [id number], DOB:  07-23-1935, LOS: 5 ADMISSION DATE:  04/08/2022, CONSULTATION DATE:  04/08/2022 REFERRING MD:  Armandina Gemma - EDP, CHIEF COMPLAINT:  SOB, body aches, nausea   History of Present Illness:  86 year old woman who presented to [facility] 12/10 with SOB, body aches, nausea and lethargy with FTT. 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 She has been hospitalized 5 times in 2023. Most recent, patient was discharged to [facility] 12/1.  On ED arrival, patient was pleasantly demented/slightly disoriented and c/o back pain. Bradycardic on tele with HR 40s-50s. BP 90s/70s. SpO2 80% on RA, improved to 100% on 2LNC. Labs were notable for WBC 12.0, BNP 904, K 5.7. Lasix '40mg'$  IV administered by EDP. Cardiology consulted with plan for transfer to [facility] for further care.  CTA PE protocol obtained 12/10 prior to transfer was negative for PE; demonstrated moderate pleural effusions and compressive atelectasis and nondependent RLL consolidation c/f PNA, GGOs of the RUL, RML and LUL (infectious vs inflammatory), anasarca.  Transferred from [facility] to [facility]. PCCM consulted; initial plan for admission to [facility] but patient required dopamine gtt initiation for   Pertinent Medical History:  Dementia, recurrent C. difficile infection, permanent A-fib on Eliquis, chronic hyponatremia, failure to thrive, stroke, H. pylori, abdominal perforation with subtotal colectomy and end ileostomy (03/21/2022)  Significant Hospital Events: Including procedures, antibiotic start and stop dates in addition to other pertinent events   12/10 Presented to [facility] ED with SOB, malaise/fatigue, lethargy and FTT. CTA Chest negative for PE but +bilateral moderate pleural effusions and GGOs of RUL/RML/LUL. Transferred to [facility] for  further evaluation. 12/11 PCCM reconsulted as patient placed on dopamine. On-going [place] discussion. Still full code   Interim History / Subjective:  No events.  Objective:  Blood pressure (!) 112/51, pulse 73, temperature 97.6 F (36.4 C), temperature source Oral, resp. rate 18, weight 46.8 kg, SpO2 98 %.        Intake/Output Summary (Last 24 hours) at 04/14/2022 1247 Last data filed at 04/14/2022 0825 Gross per 24 hour  Intake 482.96 ml  Output 820 ml  Net -337.04 ml    Filed Weights   04/09/22 1813  Weight: 46.8 kg   Frail cachetic woman in NAD +muscle wasting Small serous fluid in atrium, no air leak Poor insight CXR reviewed: minimal fluid, trivial PTX  Resolved Hospital Problem List:  Hyperkalemia   Assessment & Plan:   L effusion question parapneumonic nearly resolved; exacerbated by underlying near end stage protein calorie malnutrition - DC chest tube - Encourage protein intake - Check AM CXR, if stable will be available PRN  Erskine Emery MD PCCM

## 2022-04-14 NOTE — Progress Notes (Signed)
ANTICOAGULATION CONSULT NOTE   Pharmacy Consult for heparin Indication: atrial fibrillation  Allergies  Allergen Reactions   Lactose Intolerance (Gi) Other (See Comments)    Intolerance    Lovastatin Other (See Comments)    Unknown reaction   Mometasone Furo-Formoterol Fum Other (See Comments)    Loss of appetite, laryngitis    Peanut-Containing Drug Products Other (See Comments)   Prozac [Fluoxetine Hcl] Other (See Comments)    Jumpy   Sulfa Antibiotics Other (See Comments)    Unknown reaction    Patient Measurements: Weight: 46.8 kg (103 lb 2.8 oz) Heparin Dosing Weight: 46.8 kg (TBW)  Vital Signs: Temp: 97.8 F (36.6 C) (12/15 2000) Temp Source: Axillary (12/15 2000) BP: 114/51 (12/15 2000) Pulse Rate: 76 (12/15 2000)  Labs: Recent Labs    04/12/22 0449 04/12/22 0450 04/12/22 1557 04/13/22 0310 04/13/22 1357 04/13/22 2243 04/14/22 0258  HGB 9.0*  --   --  8.0*  --   --  8.7*  HCT 27.4*  --   --  23.9*  --   --  26.0*  PLT 334  --   --  268  --   --  285  APTT  --  21* 47* 66*  --   --   --   HEPARINUNFRC  --   --   --  0.19* 0.23* <0.10* 0.15*  CREATININE 0.54  --   --  0.48  --   --   --      Estimated Creatinine Clearance: 38.7 mL/min (by C-G formula based on SCr of 0.48 mg/dL).   Medical History: Past Medical History:  Diagnosis Date   Adenomatous polyp of colon 2020   Allergy    Anxiety    Basal cell carcinoma (BCC)    Cataract    Depression    Helicobacter pylori gastritis    Osteopenia    Paroxysmal atrial fibrillation (HCC)    Pneumonia    hx of 03/2008   Stroke [facility])    TIA long time ago    Medications:  Infusions:   sodium chloride Stopped (04/11/22 1414)   heparin 1,100 Units/hr (04/13/22 2339)    Assessment: Julie Montgomery is an 86 yo female admitted on 04/08/2022 with aspiration PNA. Patient had pleural catheter inserted 12/13 and pharmacy consulted for heparin dosing for A-fib. Patient was on Eliquis prior to admission and has been on  Eliquis inpatient. Last Eliquis dose 04/11/2022 at 1045. Expect heparin levels to be falsely elevated from Eliquis dose this morning (12/13). Will use aPTT for drug monitoring until aPTT and anti-Xa correlate. Hgb 8.3 and stable, plt 328. Baseline aPTT of 33 on 04/08/2022.  12/16 AM update:  Heparin level sub-therapeutic   Goal of Therapy:  Heparin level 0.3-0.7 units/mL Monitor platelets by anticoagulation protocol: Yes   Plan:  Inc heparin to 1200 units/hr Re-check heparin level in 8 hours  Narda Bonds, PharmD, [place] Pharmacist Phone: [phone]

## 2022-04-15 ENCOUNTER — Inpatient Hospital Stay (HOSPITAL_COMMUNITY): Payer: Medicare Other

## 2022-04-15 ENCOUNTER — Other Ambulatory Visit: Payer: Self-pay

## 2022-04-15 ENCOUNTER — Encounter (HOSPITAL_COMMUNITY): Payer: Self-pay

## 2022-04-15 ENCOUNTER — Emergency Department (HOSPITAL_COMMUNITY)
Admission: EM | Admit: 2022-04-15 | Discharge: 2022-04-15 | Disposition: A | Discharge status: Home / Self Care | Payer: Medicare Other | Attending: Emergency Medicine | Admitting: Emergency Medicine

## 2022-04-15 ENCOUNTER — Emergency Department (HOSPITAL_COMMUNITY): Payer: Medicare Other

## 2022-04-15 DIAGNOSIS — R079 Chest pain, unspecified: Secondary | ICD-10-CM

## 2022-04-15 DIAGNOSIS — J90 Pleural effusion, not elsewhere classified: Secondary | ICD-10-CM | POA: Diagnosis not present

## 2022-04-15 DIAGNOSIS — J168 Pneumonia due to other specified infectious organisms: Secondary | ICD-10-CM | POA: Insufficient documentation

## 2022-04-15 DIAGNOSIS — Z91010 Allergy to peanuts: Secondary | ICD-10-CM | POA: Diagnosis not present

## 2022-04-15 DIAGNOSIS — R0789 Other chest pain: Secondary | ICD-10-CM | POA: Diagnosis not present

## 2022-04-15 DIAGNOSIS — Z85828 Personal history of other malignant neoplasm of skin: Secondary | ICD-10-CM | POA: Insufficient documentation

## 2022-04-15 DIAGNOSIS — J189 Pneumonia, unspecified organism: Secondary | ICD-10-CM

## 2022-04-15 LAB — CBC WITH DIFFERENTIAL/PLATELET
Abs Immature Granulocytes: 0.05 10*3/uL (ref 0.00–0.07)
Basophils Absolute: 0 10*3/uL (ref 0.0–0.1)
Basophils Relative: 0 %
Eosinophils Absolute: 0.2 10*3/uL (ref 0.0–0.5)
Eosinophils Relative: 1 %
HCT: 24.8 % — ABNORMAL LOW (ref 36.0–46.0)
Hemoglobin: 8.4 g/dL — ABNORMAL LOW (ref 12.0–15.0)
Immature Granulocytes: 0 %
Lymphocytes Relative: 6 %
Lymphs Abs: 0.8 10*3/uL (ref 0.7–4.0)
MCH: 30 pg (ref 26.0–34.0)
MCHC: 33.9 g/dL (ref 30.0–36.0)
MCV: 88.6 fL (ref 80.0–100.0)
Monocytes Absolute: 1.3 10*3/uL — ABNORMAL HIGH (ref 0.1–1.0)
Monocytes Relative: 10 %
Neutro Abs: 9.9 10*3/uL — ABNORMAL HIGH (ref 1.7–7.7)
Neutrophils Relative %: 83 %
Platelets: 263 10*3/uL (ref 150–400)
RBC: 2.8 MIL/uL — ABNORMAL LOW (ref 3.87–5.11)
RDW: 15.9 % — ABNORMAL HIGH (ref 11.5–15.5)
WBC: 12.2 10*3/uL — ABNORMAL HIGH (ref 4.0–10.5)
nRBC: 0 % (ref 0.0–0.2)

## 2022-04-15 LAB — HEPARIN LEVEL (UNFRACTIONATED): Heparin Unfractionated: 0.33 IU/mL (ref 0.30–0.70)

## 2022-04-15 MED ORDER — BUMETANIDE 0.5 MG PO TABS: 0.5000 mg | ORAL_TABLET | Freq: Two times a day (BID) | ORAL | 0 refills | Status: AC

## 2022-04-15 MED ORDER — OXYCODONE HCL 5 MG PO TABS: 5.0000 mg | ORAL_TABLET | Freq: Four times a day (QID) | ORAL | 0 refills | Status: AC | PRN

## 2022-04-15 MED ORDER — MIDODRINE HCL 10 MG PO TABS: 10.0000 mg | ORAL_TABLET | Freq: Three times a day (TID) | ORAL | 0 refills | Status: AC

## 2022-04-15 MED ORDER — VANCOMYCIN HCL 125 MG PO CAPS: 125.0000 mg | ORAL_CAPSULE | Freq: Two times a day (BID) | ORAL | 0 refills | Status: AC

## 2022-04-15 MED ORDER — APIXABAN 2.5 MG PO TABS
2.5000 mg | ORAL_TABLET | Freq: Two times a day (BID) | ORAL | Status: DC
  Administered 2022-04-15: 2.5 mg via ORAL
  Filled 2022-04-15: qty 1

## 2022-04-15 MED ORDER — AMOXICILLIN-POT CLAVULANATE 875-125 MG PO TABS: 1.0000 | ORAL_TABLET | Freq: Two times a day (BID) | ORAL | 0 refills | Status: DC

## 2022-04-15 NOTE — Progress Notes (Signed)
Brief PMT Progress Note:  Medical record reviewed including progress notes, labs.  Noted that family is anticipating delivery of DME today for discharge home with hospice.  At this time, goals of care are clear and no further acute palliative needs identified.  PMT will follow peripherally and remains available to family as needed.  Thank you for allowing the Palliative Medicine Team to assist in the care of this patient.   Dorthy Cooler, [facility] Palliative Medicine Team Team phone # [phone]  NO CHARGE

## 2022-04-15 NOTE — Discharge Instructions (Signed)
Disposition.  Residential hospice °Condition.  Guarded °CODE STATUS.  DNR °Activity.  With assistance as tolerated, full fall precautions. °Diet.  Soft with feeding assistance and aspiration precautions. °Goal of care.  Comfort. ° °

## 2022-04-15 NOTE — Progress Notes (Signed)
ANTICOAGULATION CONSULT NOTE   Pharmacy Consult for heparin Indication: atrial fibrillation  Allergies  Allergen Reactions   Lactose Intolerance (Gi) Other (See Comments)    Intolerance    Lovastatin Other (See Comments)    Unknown reaction   Mometasone Furo-Formoterol Fum Other (See Comments)    Loss of appetite, laryngitis    Peanut-Containing Drug Products Other (See Comments)   Prozac [Fluoxetine Hcl] Other (See Comments)    Jumpy   Sulfa Antibiotics Other (See Comments)    Unknown reaction    Patient Measurements: Weight: 46.8 kg (103 lb 2.8 oz) Heparin Dosing Weight: 46.8 kg (TBW)  Vital Signs: Temp: 98 F (36.7 C) (12/17 0400) Temp Source: Oral (12/17 0400) BP: 128/65 (12/17 0400) Pulse Rate: 68 (12/17 0400)  Labs: Recent Labs     0000 04/12/22 1557 04/13/22 0310 04/13/22 1357 04/14/22 0258 04/14/22 1114 04/15/22 0119  HGB   < >  --  8.0*  --  8.7*  --  8.4*  HCT  --   --  23.9*  --  26.0*  --  24.8*  PLT  --   --  268  --  285  --  263  APTT  --  47* 66*  --   --   --   --   HEPARINUNFRC  --   --  0.19*   < > 0.15* 0.17* 0.33  CREATININE  --   --  0.48  --   --   --   --    < > = values in this interval not displayed.     Estimated Creatinine Clearance: 38.7 mL/min (by C-G formula based on SCr of 0.48 mg/dL).   Medical History: Past Medical History:  Diagnosis Date   Adenomatous polyp of colon 2020   Allergy    Anxiety    Basal cell carcinoma (BCC)    Cataract    Depression    Helicobacter pylori gastritis    Osteopenia    Paroxysmal atrial fibrillation (HCC)    Pneumonia    hx of 03/2008   Stroke [facility])    TIA long time ago    Medications:  Infusions:   sodium chloride Stopped (04/11/22 1414)   heparin 1,300 Units/hr (04/14/22 1543)    Assessment: Julie Montgomery is an 86 yo female with PMH of Afib on Eliquis 2.'5mg'$  BID admitted on 04/08/2022 with aspiration PNA. Patient had pleural catheter inserted 12/13 and was transitioned to heparin for  A-fib. Since the procedure no additional bleeding has been noted and her CBC has been stable. Pharmacy has been consulted to switch her back to Eliquis for Afib.   Goal of Therapy:  Therapeutic anticoagulation with Eliquis Monitor platelets by anticoagulation protocol: Yes   Plan:  Discontinue heparin drip Start Eliquis 2.'5mg'$  PO BID Continue to monitor H&H and platelets  Titus Dubin, PharmD PGY1 Pharmacy Resident 04/15/2022 9:09 AM

## 2022-04-15 NOTE — ED Provider Notes (Signed)
I saw and evaluated the patient, reviewed the resident's note and I agree with the findings and plan.  EKG Interpretation  Date/Time:  Sunday April 15 2022 19:16:11 EST Ventricular Rate:  68 PR Interval:  153 QRS Duration: 94 QT Interval:  454 QTC Calculation: 483 R Axis:   43 Text Interpretation: Sinus rhythm Probable left atrial enlargement No significant change since last tracing Confirmed by Lacretia Leigh ([id number]) on 04/15/2022 7:69:69 PM   86 year old female presents with right-sided chest pain that is worse with moving.  X-ray of chest as pneumonia.  Patient is in hospice at this time.  Will treat with antibiotics and discharged back   Lacretia Leigh, MD 04/15/22 2044

## 2022-04-15 NOTE — ED Triage Notes (Signed)
Patient arrives via EMS for chest pain. Patient was discharged from this facility today and went home on hospice care. Per EMS, hospice nurse gave patient Ativan and Oxycodone is hopes to relieve chest pain. Pain was not relieved and family opted for patient to be taken out of hospice care to be brought here. EMS gave 9mg of Fentanyl in route to this facility.

## 2022-04-15 NOTE — Progress Notes (Signed)
04/15/2022 CXR reviewed, lung remains up after chest tube removal.  Available PRN.  Erskine Emery MD PCCM

## 2022-04-15 NOTE — ED Notes (Signed)
PTAR Called 

## 2022-04-15 NOTE — Progress Notes (Signed)
ANTICOAGULATION CONSULT NOTE- Pharmacy Consult for heparin Indication: atrial fibrillation Brief A/P: Heparin level within goal range Continue Heparin at current rate   Allergies  Allergen Reactions   Lactose Intolerance (Gi) Other (See Comments)    Intolerance    Lovastatin Other (See Comments)    Unknown reaction   Mometasone Furo-Formoterol Fum Other (See Comments)    Loss of appetite, laryngitis    Peanut-Containing Drug Products Other (See Comments)   Prozac [Fluoxetine Hcl] Other (See Comments)    Jumpy   Sulfa Antibiotics Other (See Comments)    Unknown reaction    Patient Measurements: Weight: 46.8 kg (103 lb 2.8 oz) Heparin Dosing Weight: 46.8 kg (TBW)  Vital Signs: Temp: 98 F (36.7 C) (12/16 2000) Temp Source: Oral (12/16 2000) BP: 127/54 (12/16 2000) Pulse Rate: 68 (12/16 2000)  Labs: Recent Labs    04/12/22 0449 04/12/22 0450 04/12/22 1557 04/13/22 0310 04/13/22 1357 04/14/22 0258 04/14/22 1114 04/15/22 0119  HGB 9.0*  --   --  8.0*  --  8.7*  --  8.4*  HCT 27.4*  --   --  23.9*  --  26.0*  --  24.8*  PLT 334  --   --  268  --  285  --  263  APTT  --  21* 47* 66*  --   --   --   --   HEPARINUNFRC  --   --   --  0.19*   < > 0.15* 0.17* 0.33  CREATININE 0.54  --   --  0.48  --   --   --   --    < > = values in this interval not displayed.     Estimated Creatinine Clearance: 38.7 mL/min (by C-G formula based on SCr of 0.48 mg/dL).   Assessment: 86 y.o. female with h/o Afib, Eliquis on hold, for heparin  Goal of Therapy:  Heparin level 0.3-0.7 units/mL Monitor platelets by anticoagulation protocol: Yes   Plan:  Continue Heparin at current rate   Phillis Knack, PharmD, BCPS

## 2022-04-15 NOTE — Discharge Instructions (Signed)
While in the emergency department you were evaluated for chest pain.  You received a chest x-ray were found to have a right upper lobe pneumonia and right pleural effusion which was previously demonstrated.  You were given a prescription for antibiotics to be taken twice daily.  Please note that you may develop diarrhea with this antibiotic.  Please follow-up with your hospice team for further management and to ensure you have adequate pain control.

## 2022-04-15 NOTE — TOC Progression Note (Addendum)
Transition of Care [facility]) - Progression Note    Patient Details  Name: Julie Montgomery MRN: [id number] Date of Birth: 06/15/1935  Transition of Care [facility]) CM/SW Contact  Carles Collet, RN Phone Number: 04/15/2022, 9:36 AM  Clinical Narrative:      Damaris Schooner w spouse [name]. DME is not in the home yet, anticipate delivery today. He understands that when he is called for delivery, it can't be postponed, and he will leave hospital to open house for DME delivery.  Plan is for [name] to call me when DME is delivered (bed table O2) so DC and PTAR transport can be requested to get patient home. (Also awaiting discharge order from MD, CM will verify before PTAR is arranged)  11:20 DC order in place, spoke w son, and he said that equipment will be delivered shortly. Agreed for [facility] pick up around 12:30 to ensure time to set up bed. [facility] called for pickup for 1pm.  Nurse and attending aware. Notified Frankie at [facility] of tentative pick up time.    Expected Discharge Plan: Home w Hospice Care Barriers to Discharge: Continued Medical Work up  Expected Discharge Plan and Services Expected Discharge Plan: [place]   Discharge Planning Services: CM Consult   Living arrangements for the past 2 months: [place] Agency: Other - See comment ([facility]) Date [facility] Agency Contacted: 04/12/22 Time [place]: 1620 Representative spoke with at [place]: [place] ([place]) Interventions    Readmission Risk Interventions    03/08/2022    3:28 PM 02/27/2022   12:23 PM 02/12/2022    9:29 AM  Readmission Risk Prevention Plan  Transportation Screening Complete Complete Complete  PCP or Specialist Appt within 3-5 Days  Complete Complete  HRI or [place]  Complete Complete  Social Work Consult for [place] Planning/Counseling  Complete Complete  Palliative Care Screening  Not Applicable Not  Applicable  Medication Review Press photographer) Complete Complete Complete  HRI or Home Care Consult Complete    SW Recovery Care/Counseling Consult Complete    Palliative Care Screening Complete    [place] Not Applicable

## 2022-04-15 NOTE — ED Provider Notes (Signed)
[facility] EMERGENCY DEPARTMENT Provider Note   CSN: [id number] Arrival date & time: 04/15/22  1914     History  Chief Complaint  Patient presents with   Chest Pain    Julie Montgomery is a 86 y.o. female.   Chest Pain The patient is a 86 year old female with history of basal cell carcinoma, A-fib, CVA, recurrent C. difficile, toxic megacolon requiring colectomy/ileostomy on 11/22, dementia, failure to thrive disease severe), and recent hospitalization (discharged yesterday) for septic shock secondary to pneumonia.  During her recent hospitalization, the patient had a left-sided chest tube for pleural effusion which was removed yesterday prior to discharge.  The patient was discharged to home hospice.  The patient was on hospice for several hours when she developed chest pain in the left side in the area of her prior chest tube.  The hospice team recommended giving the patient her home dose of oxycodone and Ativan which did not seem to improve her symptoms.  The hospice nurse told the patient's son that she needed to be taken to the emergency department for further evaluation.  On arrival, the patient is hemodynamically stable but somnolent and difficult to arouse.     Home Medications Prior to Admission medications   Medication Sig Start Date End Date Taking? Authorizing Provider  amoxicillin-clavulanate (AUGMENTIN) 875-125 MG tablet Take 1 tablet by mouth every 12 (twelve) hours. 04/15/22  Yes Knox Saliva, MD  alum & mag hydroxide-simeth (MAALOX/MYLANTA) 200-200-20 MG/5ML suspension Take 30 mLs by mouth every 6 (six) hours as needed for indigestion or heartburn. 03/30/22   Azucena Fallen, MD  amiodarone (PACERONE) 200 MG tablet Take 1 tablet (200 mg total) by mouth daily. 03/30/22   Azucena Fallen, MD  apixaban (ELIQUIS) 2.5 MG TABS tablet Take 1 tablet (2.5 mg total) by mouth 2 (two) times daily. 03/01/22   Plotnikov, Georgina Quint, MD  bumetanide (BUMEX)  0.5 MG tablet Take 1 tablet (0.5 mg total) by mouth 2 (two) times daily. 04/15/22   Leroy Sea, MD  LORazepam (ATIVAN) 0.5 MG tablet Take 1 tablet (0.5 mg total) by mouth at bedtime as needed for anxiety or sleep. 04/02/22   Plotnikov, Georgina Quint, MD  memantine (NAMENDA) 5 MG tablet Take 5 mg by mouth 2 (two) times daily. 02/20/22   [provider]  midodrine (PROAMATINE) 10 MG tablet Take 1 tablet (10 mg total) by mouth 3 (three) times daily with meals. 04/15/22 05/15/22  Leroy Sea, MD  oxyCODONE (OXY IR/ROXICODONE) 5 MG immediate release tablet Take 1 tablet (5 mg total) by mouth every 6 (six) hours as needed for moderate pain. 04/15/22   Leroy Sea, MD  Probiotic Product (ALIGN) 4 MG CAPS Take 1 capsule (4 mg total) by mouth daily. 12/26/21   Plotnikov, Georgina Quint, MD  saccharomyces boulardii (FLORASTOR) 250 MG capsule Take 1 capsule (250 mg total) by mouth 2 (two) times daily. 03/30/22 04/29/22  Azucena Fallen, MD  vancomycin (VANCOCIN) 125 MG capsule Take 1 capsule (125 mg total) by mouth 2 (two) times daily for 4 days. 04/15/22 04/19/22  Leroy Sea, MD      Allergies    Lactose intolerance (gi), Lovastatin, Mometasone furo-formoterol fum, Peanut-containing drug products, Prozac [fluoxetine hcl], and Sulfa antibiotics    Review of Systems   Review of Systems  Cardiovascular:  Positive for chest pain.  See HPI  Physical Exam Updated Vital Signs BP (!) 120/49   Pulse 61   Temp  98 F (36.7 C) (Oral)   Resp 18   Ht 5\' 2"  (1.575 m)   Wt 46.8 kg   SpO2 100%   BMI 18.87 kg/m  Physical Exam Vitals and nursing note reviewed.  Constitutional:      General: She is not in acute distress.    Appearance: She is well-developed.     Comments: Hemodynamically stable but somnolent and difficult to arouse.  HENT:     Head: Normocephalic and atraumatic.  Eyes:     Conjunctiva/sclera: Conjunctivae normal.  Cardiovascular:     Rate and Rhythm: Normal rate  and regular rhythm.     Heart sounds: No murmur heard. Pulmonary:     Effort: Pulmonary effort is normal. No respiratory distress.     Breath sounds: Normal breath sounds.     Comments: On 3 L nasal cannula Abdominal:     Palpations: Abdomen is soft.     Tenderness: There is no abdominal tenderness.  Musculoskeletal:        General: No swelling.     Cervical back: Neck supple.  Skin:    General: Skin is warm and dry.     Capillary Refill: Capillary refill takes less than 2 seconds.     ED Results / Procedures / Treatments   Labs (all labs ordered are listed, but only abnormal results are displayed) Labs Reviewed - No data to display  EKG EKG Interpretation  Date/Time:  Sunday April 15 2022 19:16:11 EST Ventricular Rate:  68 PR Interval:  153 QRS Duration: 94 QT Interval:  454 QTC Calculation: 483 R Axis:   43 Text Interpretation: Sinus rhythm Probable left atrial enlargement No significant change since last tracing Confirmed by Lorre Nick ([id number]) on 04/15/2022 7:28:36 PM  Radiology DG Chest Portable 1 View  Result Date: 04/15/2022 CLINICAL DATA:  Chest pain EXAM: PORTABLE CHEST 1 VIEW COMPARISON:  04/15/2022 at 0630 hours FINDINGS: Progressive right upper lobe opacity, suspicious for pneumonia. Moderate loculated right pleural effusion.  No pneumothorax. Heart is normal in size. IMPRESSION: Progressive right upper lobe opacity, suspicious for pneumonia. Moderate loculated right pleural effusion. Electronically Signed   By: Charline Bills M.D.   On: 04/15/2022 20:33   DG Chest Port 1 View  Result Date: 04/15/2022 CLINICAL DATA:  Pneumothorax EXAM: PORTABLE CHEST 1 VIEW COMPARISON:  04/14/2022 FINDINGS: Interval removal of left chest tube. No pneumothorax. Stable cardiomediastinal contours. Persistent bilateral airspace opacities, right worse than left, not appreciably changed from prior. Probable small bilateral pleural effusions. IMPRESSION: 1. Interval removal  of left chest tube. No pneumothorax. 2. Otherwise, no significant interval change. Electronically Signed   By: Duanne Guess D.O.   On: 04/15/2022 09:50   DG CHEST PORT 1 VIEW  Result Date: 04/14/2022 CLINICAL DATA:  Chest tube. EXAM: PORTABLE CHEST 1 VIEW COMPARISON:  04/12/2022 FINDINGS: Left-sided pigtail pleural drainage catheter unchanged. Lungs are otherwise adequately inflated as previously seen small left apical pneumothorax not well visualized on today's exam. Subtle hazy opacification over the left perihilar region and left base likely small left effusion and possible mild vascular congestion. Patchy airspace opacification over the right lung slightly worse and may be due to multifocal infection. Suggestion of small right pleural effusion. Cardiomediastinal silhouette and remainder of the exam is unchanged. IMPRESSION: 1. Patchy airspace opacification over the right lung slightly worse and may be due to multifocal infection. Suggestion of small right pleural effusion. 2. Stable left-sided pigtail pleural drainage catheter. Previously seen small left apical pneumothorax not visualized  on today's exam. 3. Suggestion of mild vascular congestion as well as small left effusion/basilar atelectasis. Electronically Signed   By: Elberta Fortis M.D.   On: 04/14/2022 09:57    Procedures Procedures    Medications Ordered in ED Medications - No data to display  ED Course/ Medical Decision Making/ A&P                           Medical Decision Making The patient is a 86 year old female with history of basal cell carcinoma, A-fib, CVA, recurrent C. difficile, toxic megacolon requiring colectomy/ileostomy on 11/22, dementia, failure to thrive disease severe), and recent hospitalization (discharged yesterday) for septic shock secondary to pneumonia.  The differential diagnosis considered includes: ACS, PE, pneumonia, pneumothorax, chest wall pain after chest tube.  On arrival, the patient is  hemodynamically stable but somnolent and difficult to arouse after having received oxycodone and Ativan just prior to arrival.  The patient's granddaughter was at bedside and called her father (the patient's son) who is the patient's healthcare power of attorney.  A in-depth goals of care discussion was had with the patient's family members and they expressed understanding that significant findings for conditions ACS, PE, pneumothorax would require interventions that would not be consistent with hospice.  The patient's family did not want to deviate from hospice care and stated that the goal for the patient was comfort.  They stated that they largely transported the patient for further evaluation at the advice of the hospice nurse and they were agreeable to do so because she initially still seemed in pain after receiving her oxycodone and Ativan and they do not yet have her prescription for morphine as she is only been established with hospice for few hours.  The patient's family was agreeable to a chest x-ray which showed a right apical pneumonia and right pleural effusion.  The patient was discharged back to home hospice with a course of antibiotics.     Amount and/or Complexity of Data Reviewed Independent Historian: caregiver    Details: Son and granddaughter External Data Reviewed: labs, radiology and notes. Radiology: ordered and independent interpretation performed. Decision-making details documented in ED Course.  Risk Prescription drug management.   [name] presentation is most consistent with acute presentation with potential threat to life or bodily function.         Final Clinical Impression(s) / ED Diagnoses Final diagnoses:  Pneumonia of right upper lobe due to infectious organism  Nonspecific chest pain    Rx / DC Orders ED Discharge Orders          Ordered    amoxicillin-clavulanate (AUGMENTIN) 875-125 MG tablet  Every 12 hours        04/15/22 2052               Knox Saliva, MD 04/16/22 Moses Manners    Lorre Nick, MD 04/18/22 2120

## 2022-04-16 ENCOUNTER — Encounter: Payer: Self-pay | Admitting: Internal Medicine

## 2022-04-16 DIAGNOSIS — L89150 Pressure ulcer of sacral region, unstageable: Secondary | ICD-10-CM | POA: Diagnosis not present

## 2022-04-16 DIAGNOSIS — E871 Hypo-osmolality and hyponatremia: Secondary | ICD-10-CM | POA: Diagnosis not present

## 2022-04-16 DIAGNOSIS — Z7901 Long term (current) use of anticoagulants: Secondary | ICD-10-CM | POA: Diagnosis not present

## 2022-04-16 DIAGNOSIS — J90 Pleural effusion, not elsewhere classified: Secondary | ICD-10-CM | POA: Diagnosis not present

## 2022-04-16 DIAGNOSIS — K219 Gastro-esophageal reflux disease without esophagitis: Secondary | ICD-10-CM | POA: Diagnosis not present

## 2022-04-16 DIAGNOSIS — Z9049 Acquired absence of other specified parts of digestive tract: Secondary | ICD-10-CM | POA: Diagnosis not present

## 2022-04-16 DIAGNOSIS — D649 Anemia, unspecified: Secondary | ICD-10-CM | POA: Diagnosis not present

## 2022-04-16 DIAGNOSIS — E43 Unspecified severe protein-calorie malnutrition: Secondary | ICD-10-CM | POA: Diagnosis not present

## 2022-04-16 DIAGNOSIS — Z8719 Personal history of other diseases of the digestive system: Secondary | ICD-10-CM | POA: Diagnosis not present

## 2022-04-16 DIAGNOSIS — R64 Cachexia: Secondary | ICD-10-CM | POA: Diagnosis not present

## 2022-04-16 DIAGNOSIS — Z9981 Dependence on supplemental oxygen: Secondary | ICD-10-CM | POA: Diagnosis not present

## 2022-04-16 DIAGNOSIS — Z8701 Personal history of pneumonia (recurrent): Secondary | ICD-10-CM | POA: Diagnosis not present

## 2022-04-16 DIAGNOSIS — Z681 Body mass index (BMI) 19 or less, adult: Secondary | ICD-10-CM | POA: Diagnosis not present

## 2022-04-16 DIAGNOSIS — Z8709 Personal history of other diseases of the respiratory system: Secondary | ICD-10-CM | POA: Diagnosis not present

## 2022-04-16 DIAGNOSIS — Z8619 Personal history of other infectious and parasitic diseases: Secondary | ICD-10-CM | POA: Diagnosis not present

## 2022-04-16 DIAGNOSIS — Z432 Encounter for attention to ileostomy: Secondary | ICD-10-CM | POA: Diagnosis not present

## 2022-04-16 DIAGNOSIS — F0390 Unspecified dementia without behavioral disturbance: Secondary | ICD-10-CM | POA: Diagnosis not present

## 2022-04-16 DIAGNOSIS — I4891 Unspecified atrial fibrillation: Secondary | ICD-10-CM | POA: Diagnosis not present

## 2022-04-16 NOTE — ED Provider Notes (Incomplete)
[facility] DEPARTMENT Provider Note   CSN: [id number] Arrival date & time: 04/15/22  1914     History {Add pertinent medical, surgical, social history, OB history to HPI:1} Chief Complaint  Patient presents with  . Chest Pain    Julie Montgomery is a 86 y.o. female.   Chest Pain The patient is a 86 year old female with history of basal cell carcinoma, A-fib, CVA, recurrent C. difficile, toxic megacolon requiring colectomy/ileostomy on 11/22, dementia, failure to thrive disease severe), and recent hospitalization (discharged yesterday) for septic shock secondary to pneumonia.  The patient was discharged to home hospice.     Home Medications Prior to Admission medications   Medication Sig Start Date End Date Taking? Authorizing Provider  alum & mag hydroxide-simeth (MAALOX/MYLANTA) 200-200-20 MG/5ML suspension Take 30 mLs by mouth every 6 (six) hours as needed for indigestion or heartburn. 03/30/22   Little Ishikawa, MD  amiodarone (PACERONE) 200 MG tablet Take 1 tablet (200 mg total) by mouth daily. 03/30/22   Little Ishikawa, MD  apixaban (ELIQUIS) 2.5 MG TABS tablet Take 1 tablet (2.5 mg total) by mouth 2 (two) times daily. 03/01/22   Plotnikov, Evie Lacks, MD  bumetanide (BUMEX) 0.5 MG tablet Take 1 tablet (0.5 mg total) by mouth 2 (two) times daily. 04/15/22   Thurnell Lose, MD  LORazepam (ATIVAN) 0.5 MG tablet Take 1 tablet (0.5 mg total) by mouth at bedtime as needed for anxiety or sleep. 04/02/22   Plotnikov, Evie Lacks, MD  memantine (NAMENDA) 5 MG tablet Take 5 mg by mouth 2 (two) times daily. 02/20/22   [provider]  midodrine (PROAMATINE) 10 MG tablet Take 1 tablet (10 mg total) by mouth 3 (three) times daily with meals. 04/15/22 05/15/22  Thurnell Lose, MD  oxyCODONE (OXY IR/ROXICODONE) 5 MG immediate release tablet Take 1 tablet (5 mg total) by mouth every 6 (six) hours as needed for moderate pain. 04/15/22   Thurnell Lose, MD  Probiotic Product (ALIGN) 4 MG CAPS Take 1 capsule (4 mg total) by mouth daily. 12/26/21   Plotnikov, Evie Lacks, MD  saccharomyces boulardii (FLORASTOR) 250 MG capsule Take 1 capsule (250 mg total) by mouth 2 (two) times daily. 03/30/22 04/29/22  Little Ishikawa, MD  vancomycin (VANCOCIN) 125 MG capsule Take 1 capsule (125 mg total) by mouth 2 (two) times daily for 4 days. 04/15/22 04/19/22  Thurnell Lose, MD      Allergies    Lactose intolerance (gi), Lovastatin, Mometasone furo-formoterol fum, Peanut-containing drug products, Prozac [fluoxetine hcl], and Sulfa antibiotics    Review of Systems   Review of Systems  Cardiovascular:  Positive for chest pain.    Physical Exam Updated Vital Signs BP (!) 117/56   Pulse 67   Temp 98 F (36.7 C) (Oral)   Resp 17   Ht '5\' 2"'$  (1.575 m)   Wt 46.8 kg   SpO2 99%   BMI 18.87 kg/m  Physical Exam  ED Results / Procedures / Treatments   Labs (all labs ordered are listed, but only abnormal results are displayed) Labs Reviewed - No data to display  EKG None  Radiology DG Chest Port 1 View  Result Date: 04/15/2022 CLINICAL DATA:  Pneumothorax EXAM: PORTABLE CHEST 1 VIEW COMPARISON:  04/14/2022 FINDINGS: Interval removal of left chest tube. No pneumothorax. Stable cardiomediastinal contours. Persistent bilateral airspace opacities, right worse than left, not appreciably changed from prior. Probable small bilateral pleural effusions. IMPRESSION: 1.  Interval removal of left chest tube. No pneumothorax. 2. Otherwise, no significant interval change. Electronically Signed   By: Davina Poke D.O.   On: 04/15/2022 09:50   DG CHEST PORT 1 VIEW  Result Date: 04/14/2022 CLINICAL DATA:  Chest tube. EXAM: PORTABLE CHEST 1 VIEW COMPARISON:  04/12/2022 FINDINGS: Left-sided pigtail pleural drainage catheter unchanged. Lungs are otherwise adequately inflated as previously seen small left apical pneumothorax not well visualized on  today's exam. Subtle hazy opacification over the left perihilar region and left base likely small left effusion and possible mild vascular congestion. Patchy airspace opacification over the right lung slightly worse and may be due to multifocal infection. Suggestion of small right pleural effusion. Cardiomediastinal silhouette and remainder of the exam is unchanged. IMPRESSION: 1. Patchy airspace opacification over the right lung slightly worse and may be due to multifocal infection. Suggestion of small right pleural effusion. 2. Stable left-sided pigtail pleural drainage catheter. Previously seen small left apical pneumothorax not visualized on today's exam. 3. Suggestion of mild vascular congestion as well as small left effusion/basilar atelectasis. Electronically Signed   By: Marin Olp M.D.   On: 04/14/2022 09:57    Procedures Procedures  {Document cardiac monitor, telemetry assessment procedure when appropriate:1}  Medications Ordered in ED Medications - No data to display  ED Course/ Medical Decision Making/ A&P                           Medical Decision Making Amount and/or Complexity of Data Reviewed Radiology: ordered.  Risk Prescription drug management.   ***  {Document critical care time when appropriate:1} {Document review of labs and clinical decision tools ie heart score, Chads2Vasc2 etc:1}  {Document your independent review of radiology images, and any outside records:1} {Document your discussion with family members, caretakers, and with consultants:1} {Document social determinants of health affecting pt's care:1} {Document your decision making why or why not admission, treatments were needed:1} Final Clinical Impression(s) / ED Diagnoses Final diagnoses:  None    Rx / DC Orders ED Discharge Orders     None

## 2022-04-16 NOTE — Progress Notes (Signed)
  Care Coordination  Outreach Note  04/16/2022 Name: CADDIE RANDLE MRN: [id number] DOB: [birth year]   Care Coordination Outreach Attempts: A third unsuccessful outreach was attempted today to offer the patient with information about available care coordination services as a benefit of their health plan.   Follow Up Plan:  No further outreach attempts will be made at this time. We have been unable to contact the patient to offer or enroll patient in care coordination services  Encounter Outcome:  No Answer  Julian Hy, [place] Direct Dial: [phone]

## 2022-04-17 ENCOUNTER — Other Ambulatory Visit: Payer: Federal, State, Local not specified - PPO

## 2022-04-17 DIAGNOSIS — Z432 Encounter for attention to ileostomy: Secondary | ICD-10-CM | POA: Diagnosis not present

## 2022-04-17 DIAGNOSIS — Z8701 Personal history of pneumonia (recurrent): Secondary | ICD-10-CM | POA: Diagnosis not present

## 2022-04-17 DIAGNOSIS — L89150 Pressure ulcer of sacral region, unstageable: Secondary | ICD-10-CM | POA: Diagnosis not present

## 2022-04-17 DIAGNOSIS — Z9049 Acquired absence of other specified parts of digestive tract: Secondary | ICD-10-CM | POA: Diagnosis not present

## 2022-04-17 DIAGNOSIS — Z8719 Personal history of other diseases of the digestive system: Secondary | ICD-10-CM | POA: Diagnosis not present

## 2022-04-17 DIAGNOSIS — E43 Unspecified severe protein-calorie malnutrition: Secondary | ICD-10-CM | POA: Diagnosis not present

## 2022-04-18 DIAGNOSIS — E43 Unspecified severe protein-calorie malnutrition: Secondary | ICD-10-CM | POA: Diagnosis not present

## 2022-04-18 DIAGNOSIS — L89150 Pressure ulcer of sacral region, unstageable: Secondary | ICD-10-CM | POA: Diagnosis not present

## 2022-04-18 DIAGNOSIS — Z8701 Personal history of pneumonia (recurrent): Secondary | ICD-10-CM | POA: Diagnosis not present

## 2022-04-18 DIAGNOSIS — Z8719 Personal history of other diseases of the digestive system: Secondary | ICD-10-CM | POA: Diagnosis not present

## 2022-04-18 DIAGNOSIS — Z432 Encounter for attention to ileostomy: Secondary | ICD-10-CM | POA: Diagnosis not present

## 2022-04-18 DIAGNOSIS — Z9049 Acquired absence of other specified parts of digestive tract: Secondary | ICD-10-CM | POA: Diagnosis not present

## 2022-04-19 ENCOUNTER — Other Ambulatory Visit: Payer: Self-pay | Admitting: Internal Medicine

## 2022-04-19 DIAGNOSIS — Z9049 Acquired absence of other specified parts of digestive tract: Secondary | ICD-10-CM | POA: Diagnosis not present

## 2022-04-19 DIAGNOSIS — L89150 Pressure ulcer of sacral region, unstageable: Secondary | ICD-10-CM | POA: Diagnosis not present

## 2022-04-19 DIAGNOSIS — Z8719 Personal history of other diseases of the digestive system: Secondary | ICD-10-CM | POA: Diagnosis not present

## 2022-04-19 DIAGNOSIS — Z432 Encounter for attention to ileostomy: Secondary | ICD-10-CM | POA: Diagnosis not present

## 2022-04-19 DIAGNOSIS — Z8701 Personal history of pneumonia (recurrent): Secondary | ICD-10-CM | POA: Diagnosis not present

## 2022-04-19 DIAGNOSIS — E43 Unspecified severe protein-calorie malnutrition: Secondary | ICD-10-CM | POA: Diagnosis not present

## 2022-04-19 MED ORDER — DOXYCYCLINE HYCLATE 50 MG PO CAPS: 50.0000 mg | ORAL_CAPSULE | Freq: Two times a day (BID) | ORAL | 0 refills | Status: DC

## 2022-04-19 MED ORDER — DOXYCYCLINE HYCLATE 50 MG PO CAPS: 50.0000 mg | ORAL_CAPSULE | Freq: Two times a day (BID) | ORAL | 0 refills | Status: AC

## 2022-04-19 NOTE — Progress Notes (Signed)
Transmission to pharmacy failed . Resent rx for doxycycline.Marland KitchenJohny Chess

## 2022-04-24 DIAGNOSIS — Z432 Encounter for attention to ileostomy: Secondary | ICD-10-CM | POA: Diagnosis not present

## 2022-04-24 DIAGNOSIS — E43 Unspecified severe protein-calorie malnutrition: Secondary | ICD-10-CM | POA: Diagnosis not present

## 2022-04-24 DIAGNOSIS — L89150 Pressure ulcer of sacral region, unstageable: Secondary | ICD-10-CM | POA: Diagnosis not present

## 2022-04-24 DIAGNOSIS — Z9049 Acquired absence of other specified parts of digestive tract: Secondary | ICD-10-CM | POA: Diagnosis not present

## 2022-04-24 DIAGNOSIS — Z8719 Personal history of other diseases of the digestive system: Secondary | ICD-10-CM | POA: Diagnosis not present

## 2022-04-24 DIAGNOSIS — Z8701 Personal history of pneumonia (recurrent): Secondary | ICD-10-CM | POA: Diagnosis not present

## 2022-04-25 DIAGNOSIS — L89150 Pressure ulcer of sacral region, unstageable: Secondary | ICD-10-CM | POA: Diagnosis not present

## 2022-04-25 DIAGNOSIS — Z432 Encounter for attention to ileostomy: Secondary | ICD-10-CM | POA: Diagnosis not present

## 2022-04-25 DIAGNOSIS — Z9049 Acquired absence of other specified parts of digestive tract: Secondary | ICD-10-CM | POA: Diagnosis not present

## 2022-04-25 DIAGNOSIS — Z8701 Personal history of pneumonia (recurrent): Secondary | ICD-10-CM | POA: Diagnosis not present

## 2022-04-25 DIAGNOSIS — Z8719 Personal history of other diseases of the digestive system: Secondary | ICD-10-CM | POA: Diagnosis not present

## 2022-04-25 DIAGNOSIS — E43 Unspecified severe protein-calorie malnutrition: Secondary | ICD-10-CM | POA: Diagnosis not present

## 2022-04-26 DIAGNOSIS — Z432 Encounter for attention to ileostomy: Secondary | ICD-10-CM | POA: Diagnosis not present

## 2022-04-26 DIAGNOSIS — L89150 Pressure ulcer of sacral region, unstageable: Secondary | ICD-10-CM | POA: Diagnosis not present

## 2022-04-26 DIAGNOSIS — Z8719 Personal history of other diseases of the digestive system: Secondary | ICD-10-CM | POA: Diagnosis not present

## 2022-04-26 DIAGNOSIS — Z8701 Personal history of pneumonia (recurrent): Secondary | ICD-10-CM | POA: Diagnosis not present

## 2022-04-26 DIAGNOSIS — Z9049 Acquired absence of other specified parts of digestive tract: Secondary | ICD-10-CM | POA: Diagnosis not present

## 2022-04-26 DIAGNOSIS — E43 Unspecified severe protein-calorie malnutrition: Secondary | ICD-10-CM | POA: Diagnosis not present

## 2022-05-01 ENCOUNTER — Other Ambulatory Visit: Payer: Federal, State, Local not specified - PPO

## 2022-05-15 ENCOUNTER — Other Ambulatory Visit: Payer: Self-pay | Admitting: Internal Medicine
